# Patient Record
Sex: Male | Born: 1940 | Race: White | Hispanic: No | Marital: Married | Smoking: Former smoker
Health system: Southern US, Community
[De-identification: ages and names within clinical notes are randomized; demographics above are authoritative.]

## PROBLEM LIST (undated history)

## (undated) ENCOUNTER — Inpatient Hospital Stay: Admission: EM | Payer: Self-pay | Source: Home / Self Care

## (undated) DIAGNOSIS — M792 Neuralgia and neuritis, unspecified: Secondary | ICD-10-CM

## (undated) DIAGNOSIS — F1190 Opioid use, unspecified, uncomplicated: Secondary | ICD-10-CM

## (undated) DIAGNOSIS — M545 Low back pain: Secondary | ICD-10-CM

## (undated) DIAGNOSIS — F419 Anxiety disorder, unspecified: Secondary | ICD-10-CM

## (undated) DIAGNOSIS — G8929 Other chronic pain: Secondary | ICD-10-CM

## (undated) DIAGNOSIS — I509 Heart failure, unspecified: Secondary | ICD-10-CM

## (undated) DIAGNOSIS — F329 Major depressive disorder, single episode, unspecified: Secondary | ICD-10-CM

## (undated) DIAGNOSIS — I712 Thoracic aortic aneurysm, without rupture, unspecified: Secondary | ICD-10-CM

## (undated) DIAGNOSIS — M549 Dorsalgia, unspecified: Secondary | ICD-10-CM

## (undated) DIAGNOSIS — F32A Depression, unspecified: Secondary | ICD-10-CM

## (undated) DIAGNOSIS — N3090 Cystitis, unspecified without hematuria: Secondary | ICD-10-CM

## (undated) DIAGNOSIS — I2510 Atherosclerotic heart disease of native coronary artery without angina pectoris: Secondary | ICD-10-CM

## (undated) DIAGNOSIS — K8020 Calculus of gallbladder without cholecystitis without obstruction: Secondary | ICD-10-CM

## (undated) HISTORY — PX: BACK SURGERY: SHX140

## (undated) HISTORY — PX: OTHER SURGICAL HISTORY: SHX169

## (undated) HISTORY — DX: Thoracic aortic aneurysm, without rupture, unspecified: I71.20

## (undated) HISTORY — DX: Atherosclerotic heart disease of native coronary artery without angina pectoris: I25.10

## (undated) HISTORY — DX: Low back pain: M54.5

## (undated) HISTORY — DX: Other chronic pain: G89.29

## (undated) HISTORY — DX: Thoracic aortic aneurysm, without rupture: I71.2

---
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
## 1999-02-09 ENCOUNTER — Ambulatory Visit (HOSPITAL_BASED_OUTPATIENT_CLINIC_OR_DEPARTMENT_OTHER): Admission: RE | Admit: 1999-02-09 | Discharge: 1999-02-09 | Payer: Self-pay | Admitting: Orthopedic Surgery

## 1999-04-18 ENCOUNTER — Ambulatory Visit (HOSPITAL_BASED_OUTPATIENT_CLINIC_OR_DEPARTMENT_OTHER): Admission: RE | Admit: 1999-04-18 | Discharge: 1999-04-18 | Payer: Self-pay | Admitting: Orthopedic Surgery

## 1999-06-29 ENCOUNTER — Encounter: Payer: Self-pay | Admitting: Rheumatology

## 1999-06-29 ENCOUNTER — Ambulatory Visit (HOSPITAL_COMMUNITY): Admission: RE | Admit: 1999-06-29 | Discharge: 1999-06-29 | Payer: Self-pay | Admitting: Rheumatology

## 2001-10-05 ENCOUNTER — Encounter: Admission: RE | Admit: 2001-10-05 | Discharge: 2001-10-05 | Payer: Self-pay | Admitting: Rheumatology

## 2001-10-05 ENCOUNTER — Encounter: Payer: Self-pay | Admitting: Rheumatology

## 2002-02-09 ENCOUNTER — Inpatient Hospital Stay (HOSPITAL_COMMUNITY): Admission: RE | Admit: 2002-02-09 | Discharge: 2002-02-11 | Payer: Self-pay | Admitting: Neurosurgery

## 2002-02-09 ENCOUNTER — Encounter: Payer: Self-pay | Admitting: Neurosurgery

## 2002-03-08 ENCOUNTER — Inpatient Hospital Stay (HOSPITAL_COMMUNITY): Admission: AD | Admit: 2002-03-08 | Discharge: 2002-03-23 | Payer: Self-pay | Admitting: Neurosurgery

## 2002-03-08 ENCOUNTER — Encounter: Payer: Self-pay | Admitting: Neurosurgery

## 2002-03-10 ENCOUNTER — Encounter: Payer: Self-pay | Admitting: Neurosurgery

## 2002-03-11 ENCOUNTER — Encounter: Payer: Self-pay | Admitting: Neurosurgery

## 2002-03-16 ENCOUNTER — Encounter: Payer: Self-pay | Admitting: Infectious Diseases

## 2002-04-02 ENCOUNTER — Encounter: Payer: Self-pay | Admitting: Infectious Diseases

## 2002-04-02 ENCOUNTER — Ambulatory Visit (HOSPITAL_COMMUNITY): Admission: RE | Admit: 2002-04-02 | Discharge: 2002-04-02 | Payer: Self-pay | Admitting: Infectious Diseases

## 2002-04-02 ENCOUNTER — Encounter: Admission: RE | Admit: 2002-04-02 | Discharge: 2002-04-02 | Payer: Self-pay | Admitting: Infectious Diseases

## 2002-04-08 ENCOUNTER — Encounter: Payer: Self-pay | Admitting: Infectious Diseases

## 2002-04-08 ENCOUNTER — Ambulatory Visit (HOSPITAL_COMMUNITY): Admission: RE | Admit: 2002-04-08 | Discharge: 2002-04-08 | Payer: Self-pay | Admitting: Infectious Diseases

## 2002-04-13 ENCOUNTER — Encounter: Admission: RE | Admit: 2002-04-13 | Discharge: 2002-04-13 | Payer: Self-pay | Admitting: Infectious Diseases

## 2002-04-14 ENCOUNTER — Inpatient Hospital Stay (HOSPITAL_COMMUNITY): Admission: AD | Admit: 2002-04-14 | Discharge: 2002-04-22 | Payer: Self-pay | Admitting: Neurosurgery

## 2002-04-29 ENCOUNTER — Ambulatory Visit (HOSPITAL_COMMUNITY): Admission: RE | Admit: 2002-04-29 | Discharge: 2002-04-29 | Payer: Self-pay | Admitting: Neurosurgery

## 2002-04-29 ENCOUNTER — Encounter: Admission: RE | Admit: 2002-04-29 | Discharge: 2002-04-29 | Payer: Self-pay | Admitting: Neurosurgery

## 2002-04-29 ENCOUNTER — Encounter: Payer: Self-pay | Admitting: Neurosurgery

## 2002-05-03 ENCOUNTER — Encounter: Admission: RE | Admit: 2002-05-03 | Discharge: 2002-05-03 | Payer: Self-pay | Admitting: Infectious Diseases

## 2002-05-10 ENCOUNTER — Encounter: Admission: RE | Admit: 2002-05-10 | Discharge: 2002-05-10 | Payer: Self-pay | Admitting: Infectious Diseases

## 2002-05-14 ENCOUNTER — Encounter: Admission: RE | Admit: 2002-05-14 | Discharge: 2002-05-14 | Payer: Self-pay | Admitting: Neurosurgery

## 2002-05-14 ENCOUNTER — Encounter: Payer: Self-pay | Admitting: Neurosurgery

## 2002-05-26 ENCOUNTER — Encounter: Payer: Self-pay | Admitting: Neurosurgery

## 2002-05-26 ENCOUNTER — Encounter: Admission: RE | Admit: 2002-05-26 | Discharge: 2002-05-26 | Payer: Self-pay | Admitting: Neurosurgery

## 2002-06-09 ENCOUNTER — Encounter: Admission: RE | Admit: 2002-06-09 | Discharge: 2002-06-09 | Payer: Self-pay | Admitting: Infectious Diseases

## 2002-06-09 ENCOUNTER — Ambulatory Visit (HOSPITAL_COMMUNITY): Admission: RE | Admit: 2002-06-09 | Discharge: 2002-06-09 | Payer: Self-pay | Admitting: Neurosurgery

## 2002-06-09 ENCOUNTER — Encounter: Payer: Self-pay | Admitting: Neurosurgery

## 2002-06-21 ENCOUNTER — Encounter: Payer: Self-pay | Admitting: Neurosurgery

## 2002-06-21 ENCOUNTER — Ambulatory Visit (HOSPITAL_COMMUNITY): Admission: RE | Admit: 2002-06-21 | Discharge: 2002-06-21 | Payer: Self-pay | Admitting: Neurosurgery

## 2002-06-26 ENCOUNTER — Ambulatory Visit (HOSPITAL_COMMUNITY): Admission: RE | Admit: 2002-06-26 | Discharge: 2002-06-27 | Payer: Self-pay | Admitting: Neurosurgery

## 2002-06-26 ENCOUNTER — Encounter: Payer: Self-pay | Admitting: Neurosurgery

## 2002-07-26 ENCOUNTER — Encounter: Admission: RE | Admit: 2002-07-26 | Discharge: 2002-07-26 | Payer: Self-pay | Admitting: Infectious Diseases

## 2003-02-18 ENCOUNTER — Encounter: Admission: RE | Admit: 2003-02-18 | Discharge: 2003-02-18 | Payer: Self-pay | Admitting: Family Medicine

## 2003-02-18 ENCOUNTER — Encounter: Payer: Self-pay | Admitting: Family Medicine

## 2003-05-18 ENCOUNTER — Ambulatory Visit (HOSPITAL_COMMUNITY): Admission: RE | Admit: 2003-05-18 | Discharge: 2003-05-18 | Payer: Self-pay | Admitting: Gastroenterology

## 2011-10-23 ENCOUNTER — Other Ambulatory Visit (HOSPITAL_COMMUNITY): Payer: Self-pay | Admitting: Gastroenterology

## 2011-10-23 DIAGNOSIS — R933 Abnormal findings on diagnostic imaging of other parts of digestive tract: Secondary | ICD-10-CM

## 2011-10-24 ENCOUNTER — Ambulatory Visit (HOSPITAL_COMMUNITY)
Admission: RE | Admit: 2011-10-24 | Discharge: 2011-10-24 | Disposition: A | Discharge status: Home / Self Care | Payer: Medicare Other | Source: Ambulatory Visit | Attending: Gastroenterology | Admitting: Gastroenterology

## 2011-10-24 DIAGNOSIS — R933 Abnormal findings on diagnostic imaging of other parts of digestive tract: Secondary | ICD-10-CM

## 2011-10-24 DIAGNOSIS — Q438 Other specified congenital malformations of intestine: Secondary | ICD-10-CM | POA: Insufficient documentation

## 2011-10-24 DIAGNOSIS — M4120 Other idiopathic scoliosis, site unspecified: Secondary | ICD-10-CM | POA: Insufficient documentation

## 2011-10-24 IMAGING — CR DG BE W/ CM - WO/W KUB
8 of 10 series · 8 of 10 positions shown · non-contrast
Comparison: None.

CLINICAL DATA: Incomplete colonoscopy.

AIR CONTRAST BARIUM ENEMA
TECHNIQUE: Initial scout AP supine abdominal image obtained to
insure adequate colon cleansing.  Barium was introduced into the
colon in a retrograde fashion and refluxed from the rectum to the
distal transverse colon.  As much of the barium as possible was
then removed through the indwelling tube via gravity drain.  Air
was then insufflated into the colon.  Spot images of the colon
followed by overhead radiographs were obtained.
Fluoroscopy time:  3.5 minutes

[view not recorded (1 of 8)]
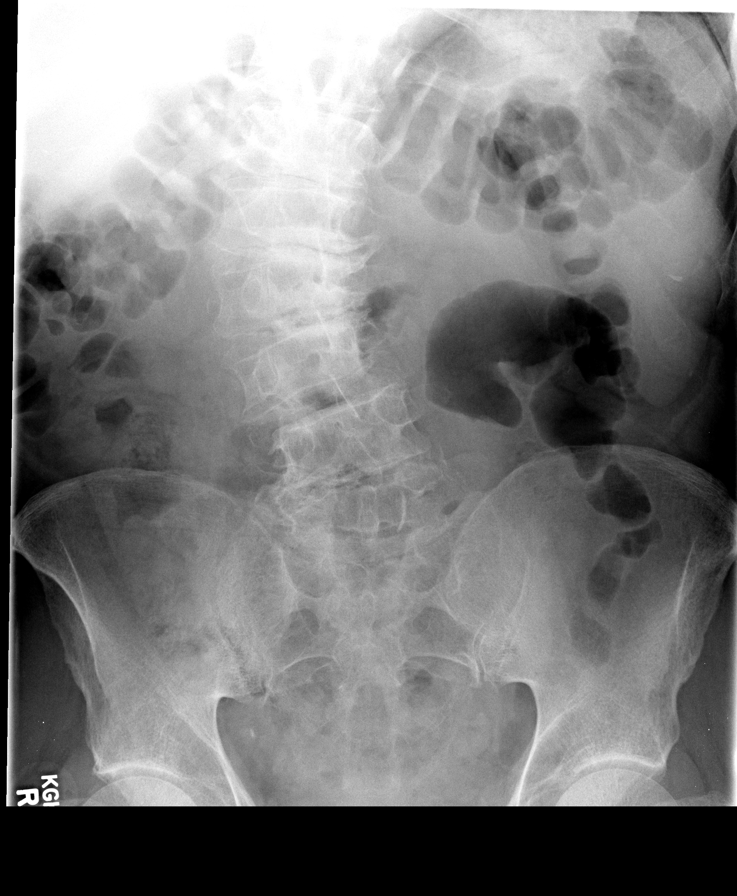

[view not recorded (2 of 8)]
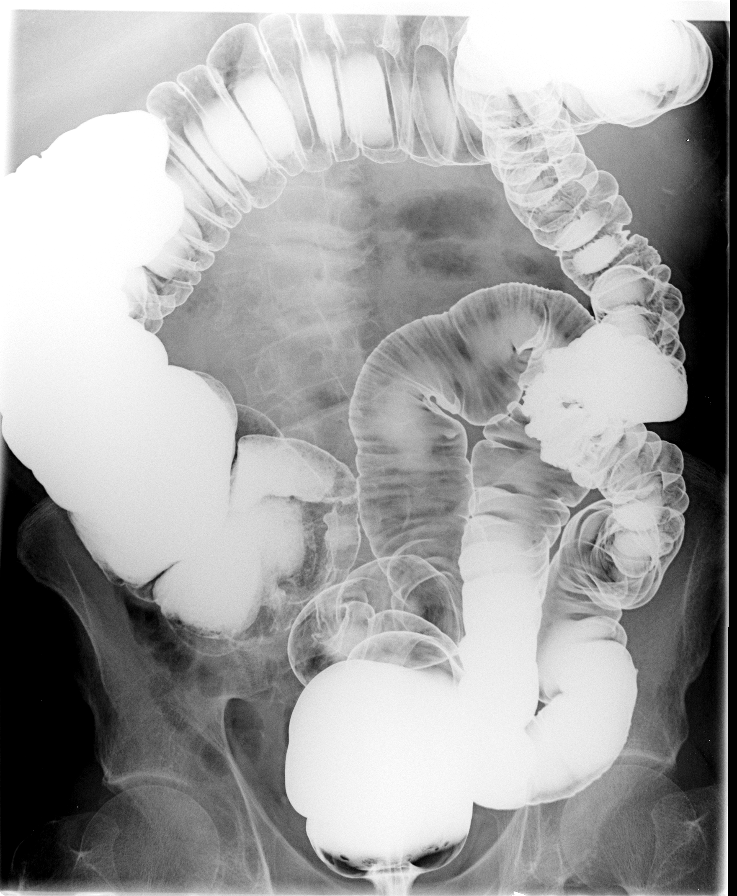

[view not recorded (3 of 8)]
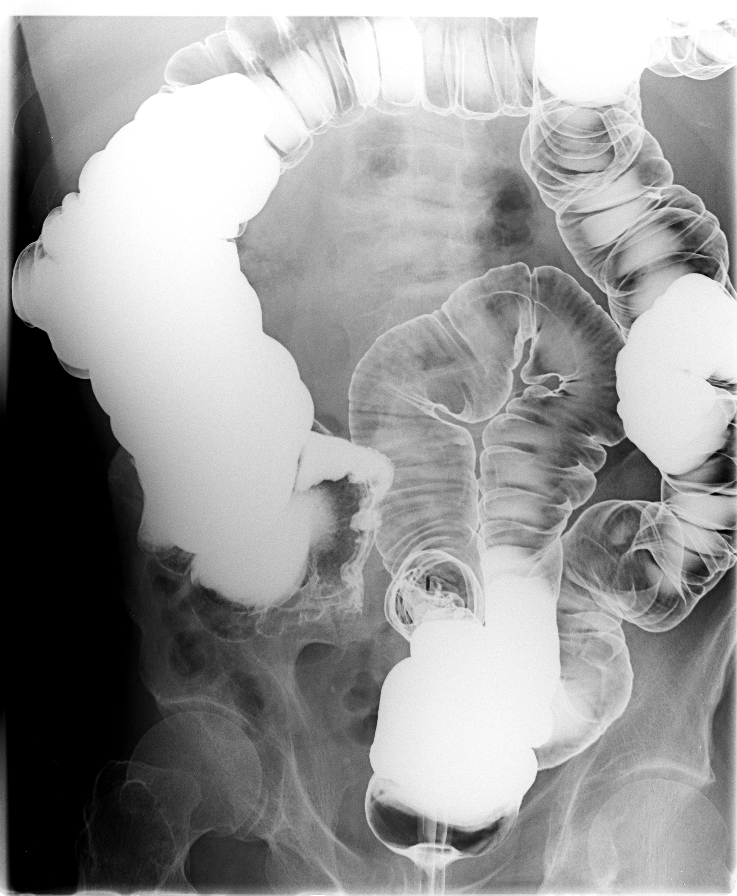

[view not recorded (4 of 8)]
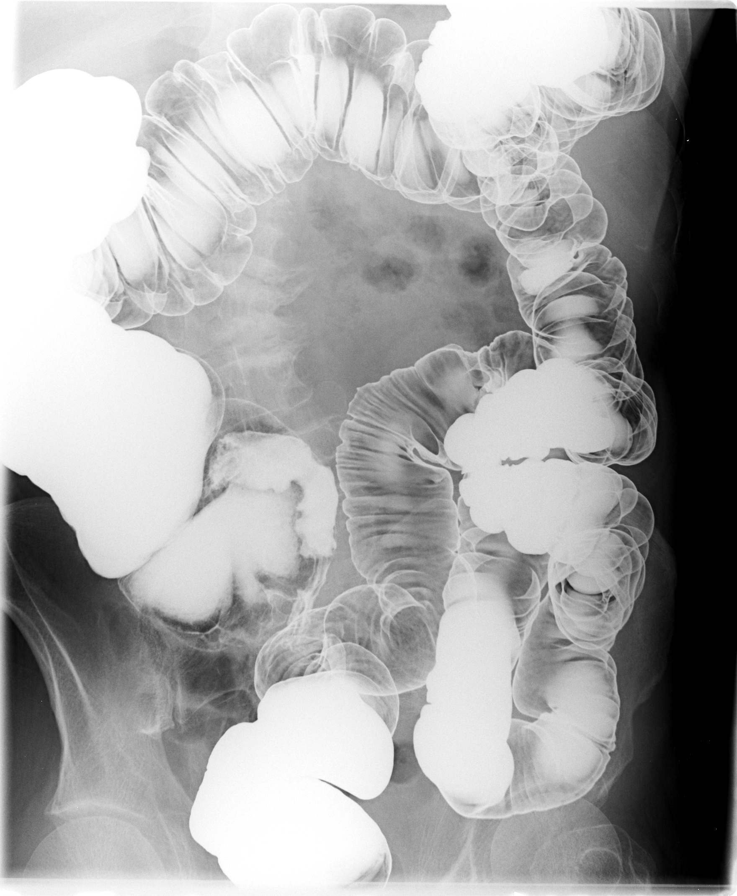

[view not recorded (5 of 8)]
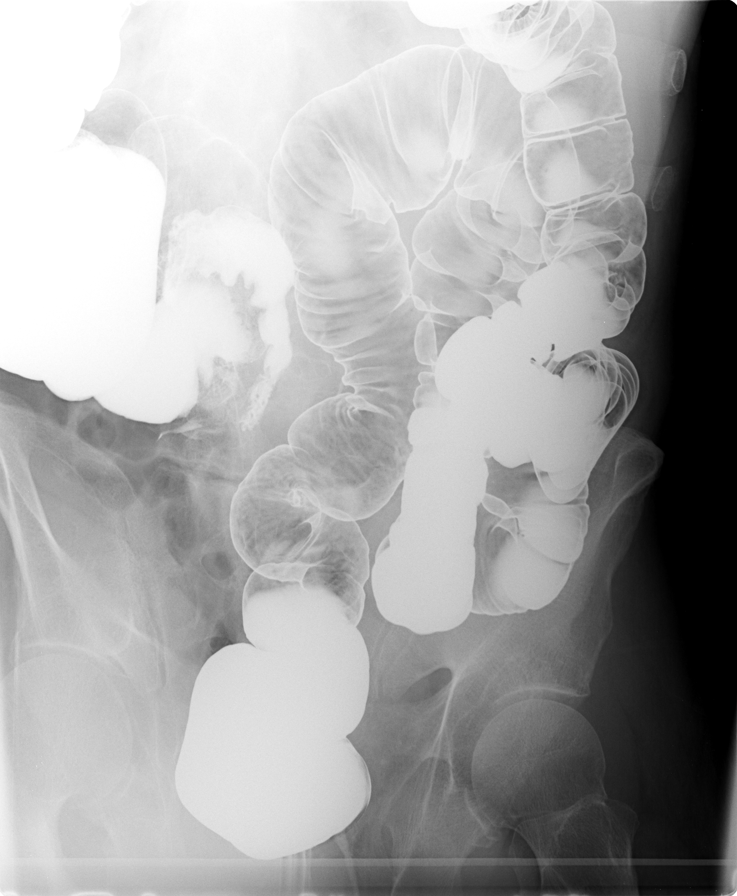

[view not recorded (6 of 8)]
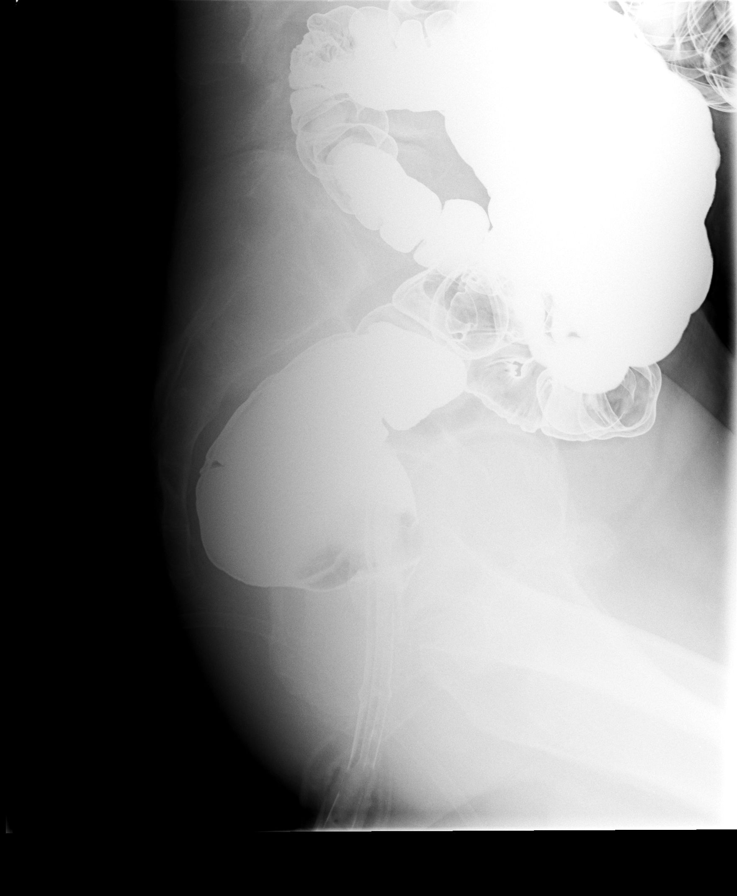

[view not recorded (7 of 8)]
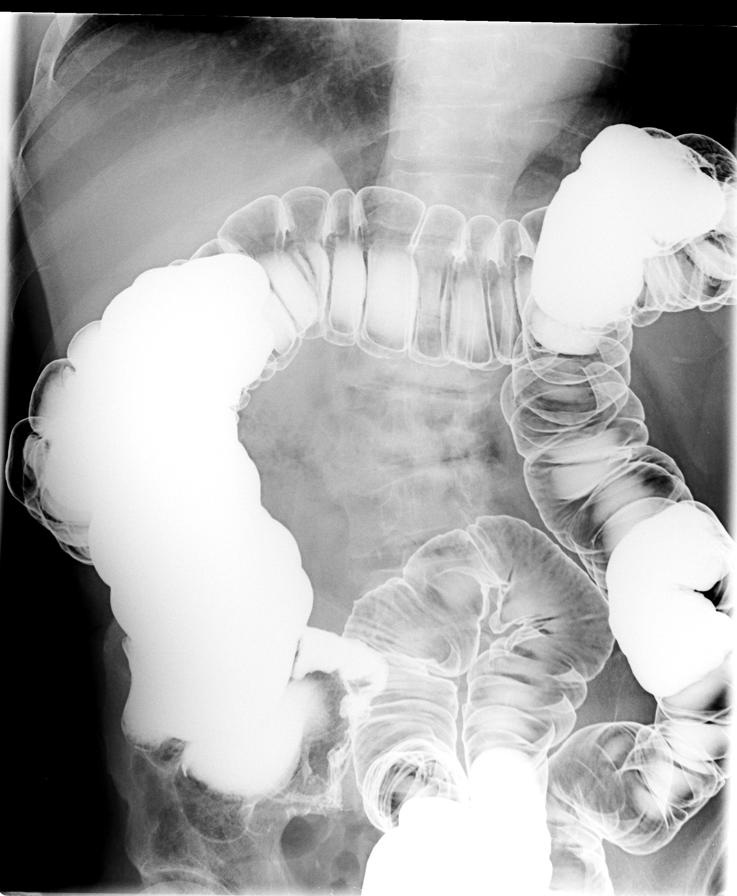

[view not recorded (8 of 8)]
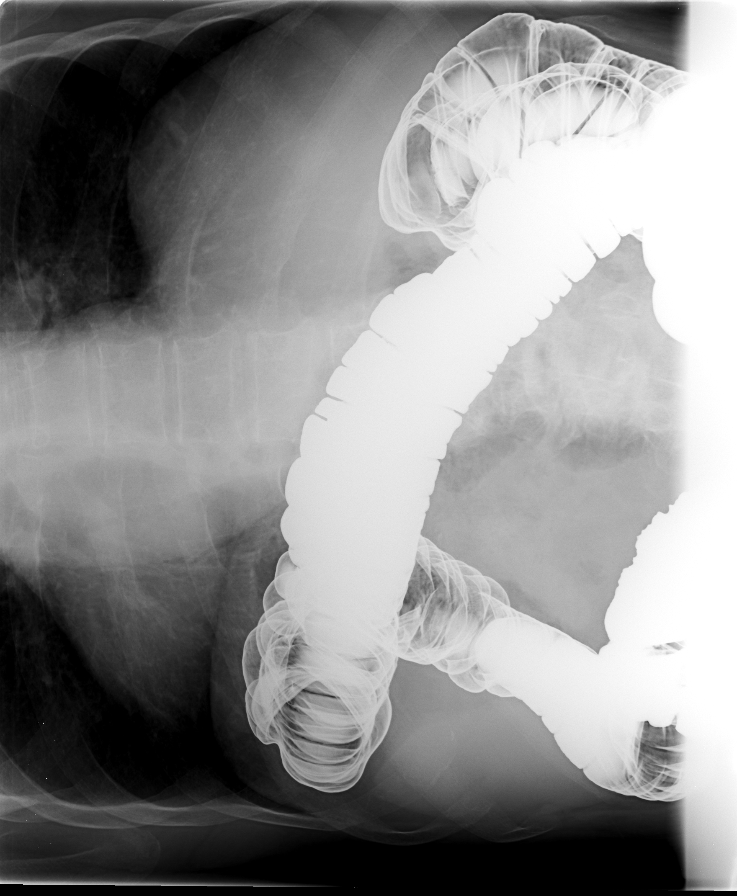

[8 of 10 positions shown; findings below may reference images not displayed]

FINDINGS: Scout image of the abdomen shows moderate retained air
in the colon, possibly from recent colonoscopy.  No visible
retained stool.  No evidence of bowel obstruction.  Severe
rightward scoliosis and degenerative changes in the lumbar spine.

There is tortuosity of the colon, particularly the sigmoid colon.
Mild tortuosity at the splenic flexure as well.  No fixed polypoid
filling defect.  No annular constricting lesion or visible mass.
IMPRESSION: Unremarkable study.

## 2011-10-24 IMAGING — RF DG BE W/ CM - WO/W KUB
11 series · 11 of 11 positions shown · non-contrast
Comparison: None.

CLINICAL DATA: Incomplete colonoscopy.

AIR CONTRAST BARIUM ENEMA
TECHNIQUE: Initial scout AP supine abdominal image obtained to
insure adequate colon cleansing.  Barium was introduced into the
colon in a retrograde fashion and refluxed from the rectum to the
distal transverse colon.  As much of the barium as possible was
then removed through the indwelling tube via gravity drain.  Air
was then insufflated into the colon.  Spot images of the colon
followed by overhead radiographs were obtained.
Fluoroscopy time:  3.5 minutes

[Series 1: run · 1 of 1 slices shown (1 of 11)]
[im 1/1]
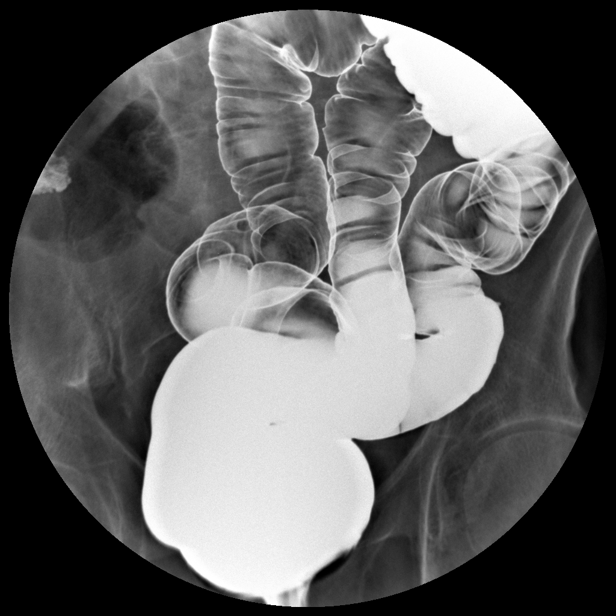

[Series 2: run · 1 of 1 slices shown (2 of 11)]
[im 1/1]
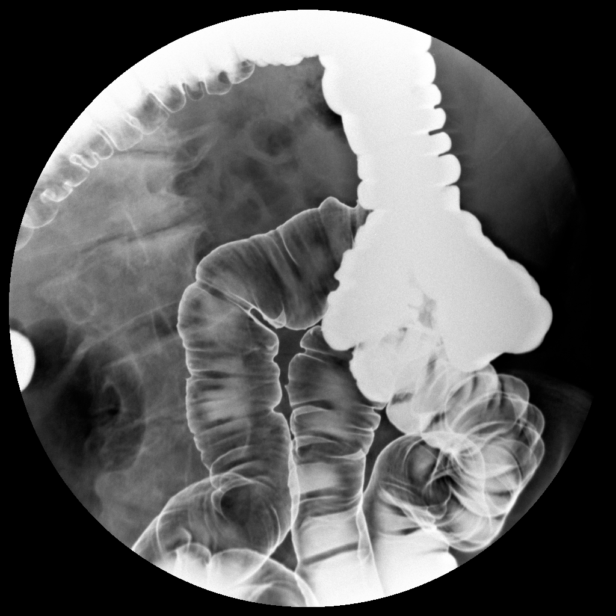

[Series 3: run · 1 of 1 slices shown (3 of 11)]
[im 1/1]
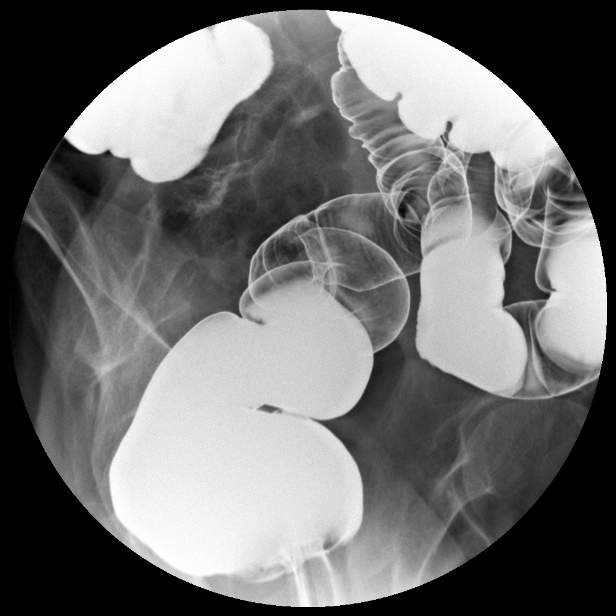

[Series 4: run · 1 of 1 slices shown (4 of 11)]
[im 1/1]
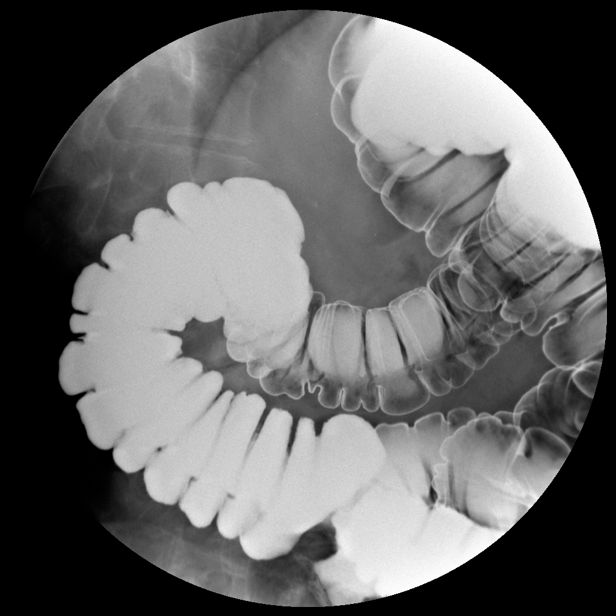

[Series 5: run · 1 of 1 slices shown (5 of 11)]
[im 1/1]
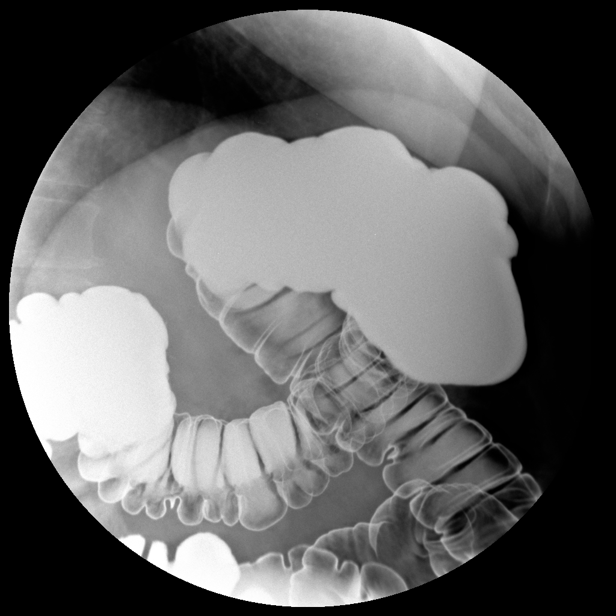

[Series 6: run · 1 of 1 slices shown (6 of 11)]
[im 1/1]
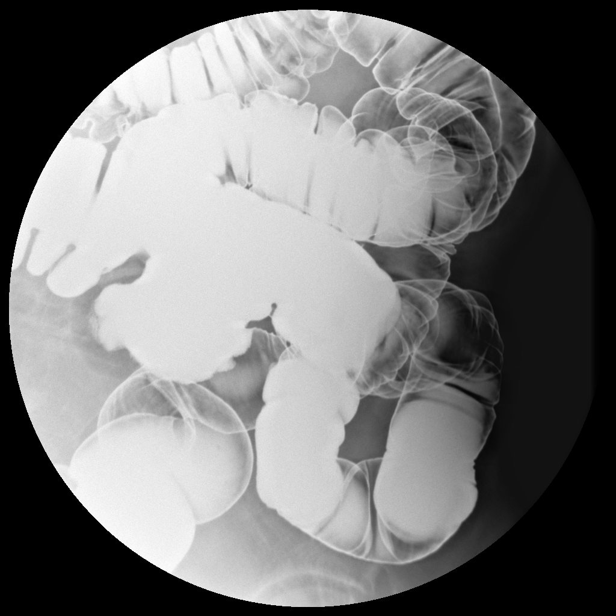

[Series 7: run · 1 of 1 slices shown (7 of 11)]
[im 1/1]
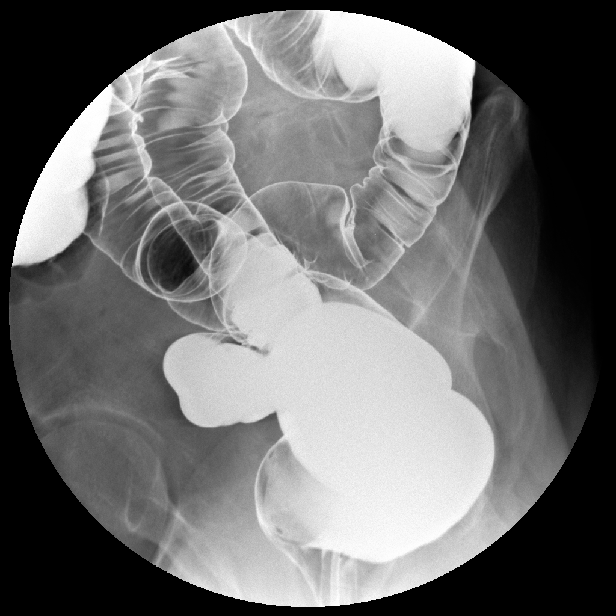

[Series 8: run · 1 of 1 slices shown (8 of 11)]
[im 1/1]
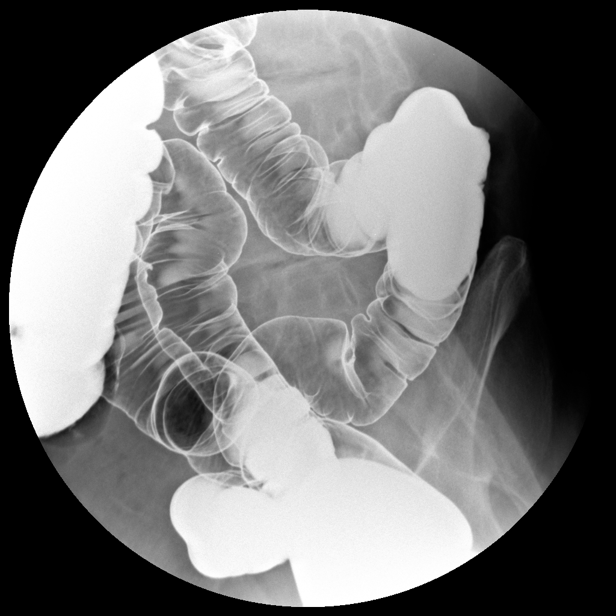

[Series 9: run · 1 of 1 slices shown (9 of 11)]
[im 1/1]
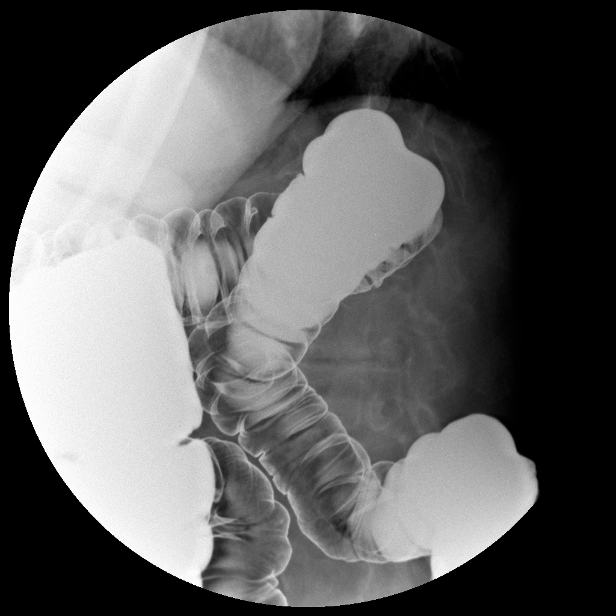

[Series 10: run · 1 of 1 slices shown (10 of 11)]
[im 1/1]
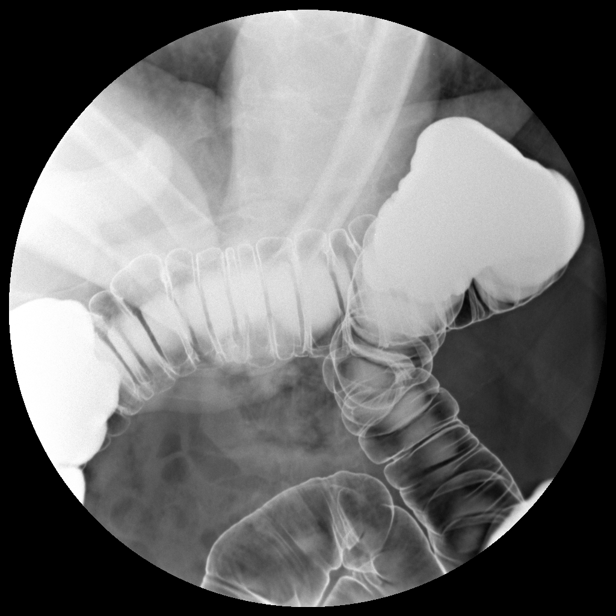

[Series 11: run · 1 of 1 slices shown (11 of 11)]
[im 1/1]
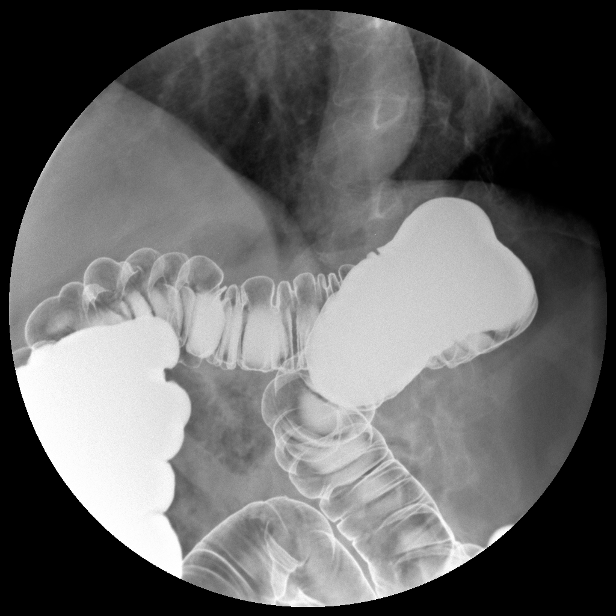

[11 of 11 positions shown; findings below may reference images not displayed]

FINDINGS: Scout image of the abdomen shows moderate retained air
in the colon, possibly from recent colonoscopy.  No visible
retained stool.  No evidence of bowel obstruction.  Severe
rightward scoliosis and degenerative changes in the lumbar spine.

There is tortuosity of the colon, particularly the sigmoid colon.
Mild tortuosity at the splenic flexure as well.  No fixed polypoid
filling defect.  No annular constricting lesion or visible mass.
IMPRESSION: Unremarkable study.

## 2014-02-08 ENCOUNTER — Emergency Department (HOSPITAL_BASED_OUTPATIENT_CLINIC_OR_DEPARTMENT_OTHER)
Admission: EM | Admit: 2014-02-08 | Discharge: 2014-02-08 | Disposition: A | Discharge status: Home / Self Care | Payer: Medicare Other | Attending: Emergency Medicine | Admitting: Emergency Medicine

## 2014-02-08 ENCOUNTER — Emergency Department (HOSPITAL_BASED_OUTPATIENT_CLINIC_OR_DEPARTMENT_OTHER): Payer: Medicare Other

## 2014-02-08 ENCOUNTER — Encounter (HOSPITAL_BASED_OUTPATIENT_CLINIC_OR_DEPARTMENT_OTHER): Payer: Self-pay | Admitting: Emergency Medicine

## 2014-02-08 DIAGNOSIS — R319 Hematuria, unspecified: Secondary | ICD-10-CM | POA: Insufficient documentation

## 2014-02-08 DIAGNOSIS — Z79899 Other long term (current) drug therapy: Secondary | ICD-10-CM | POA: Insufficient documentation

## 2014-02-08 DIAGNOSIS — Z791 Long term (current) use of non-steroidal anti-inflammatories (NSAID): Secondary | ICD-10-CM | POA: Insufficient documentation

## 2014-02-08 DIAGNOSIS — IMO0002 Reserved for concepts with insufficient information to code with codable children: Secondary | ICD-10-CM | POA: Insufficient documentation

## 2014-02-08 DIAGNOSIS — S298XXA Other specified injuries of thorax, initial encounter: Secondary | ICD-10-CM | POA: Insufficient documentation

## 2014-02-08 DIAGNOSIS — S3981XA Other specified injuries of abdomen, initial encounter: Secondary | ICD-10-CM | POA: Insufficient documentation

## 2014-02-08 DIAGNOSIS — S76012A Strain of muscle, fascia and tendon of left hip, initial encounter: Secondary | ICD-10-CM

## 2014-02-08 DIAGNOSIS — Y929 Unspecified place or not applicable: Secondary | ICD-10-CM | POA: Insufficient documentation

## 2014-02-08 DIAGNOSIS — X500XXA Overexertion from strenuous movement or load, initial encounter: Secondary | ICD-10-CM | POA: Insufficient documentation

## 2014-02-08 DIAGNOSIS — Y9389 Activity, other specified: Secondary | ICD-10-CM | POA: Insufficient documentation

## 2014-02-08 HISTORY — DX: Neuralgia and neuritis, unspecified: M79.2

## 2014-02-08 IMAGING — CR DG HIP W/ PELVIS BILAT
5 series · 5 of 5 positions shown · non-contrast
Comparison: None.

CLINICAL DATA: Hip pain

EXAM:
BILATERAL HIP WITH PELVIS - 4+ VIEW

[t pelvis a.p.]
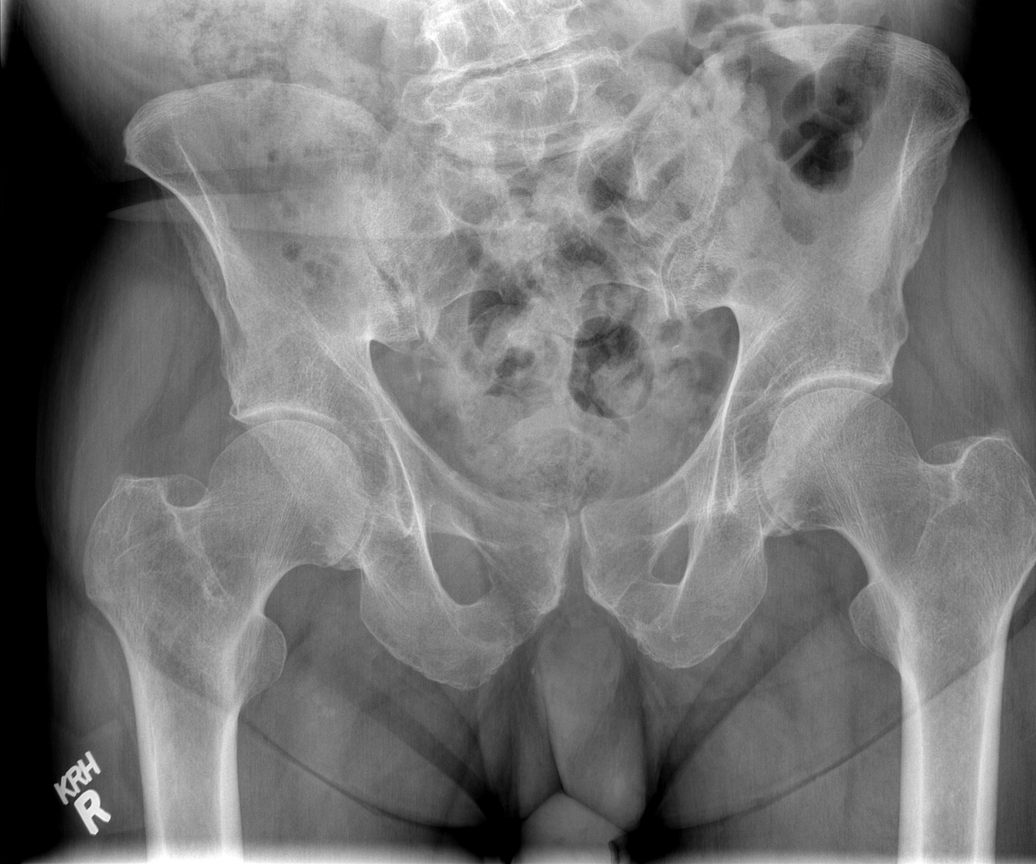

[t hip ap left]
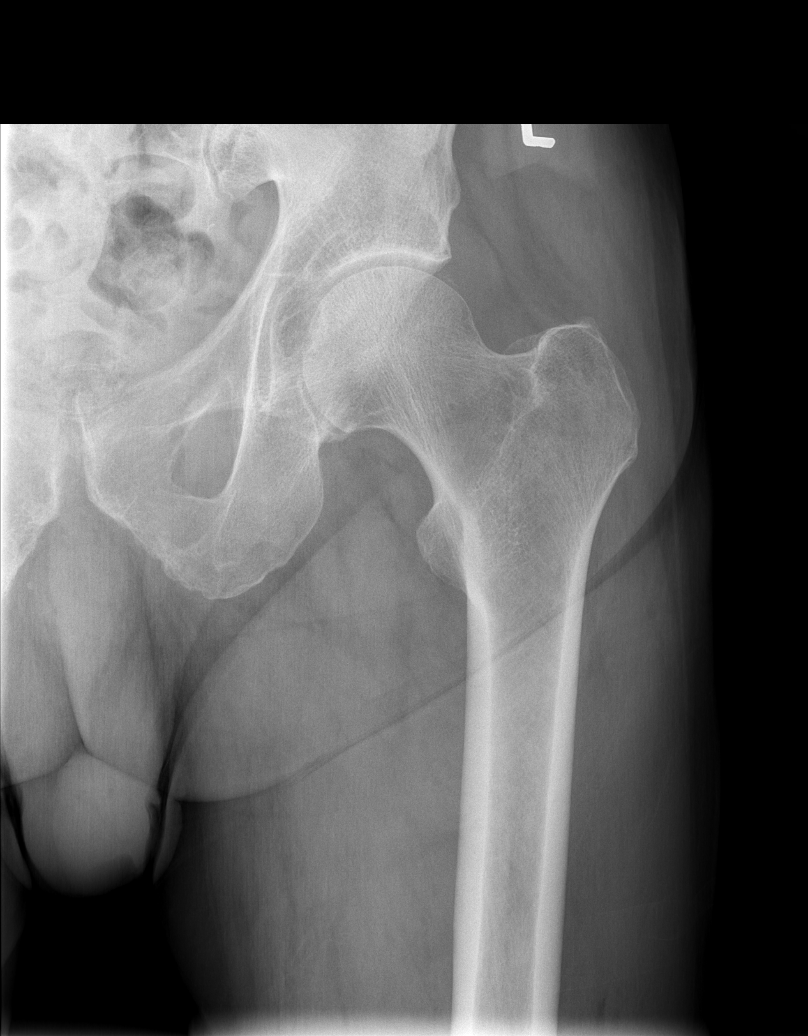

[t hip frog leg left]
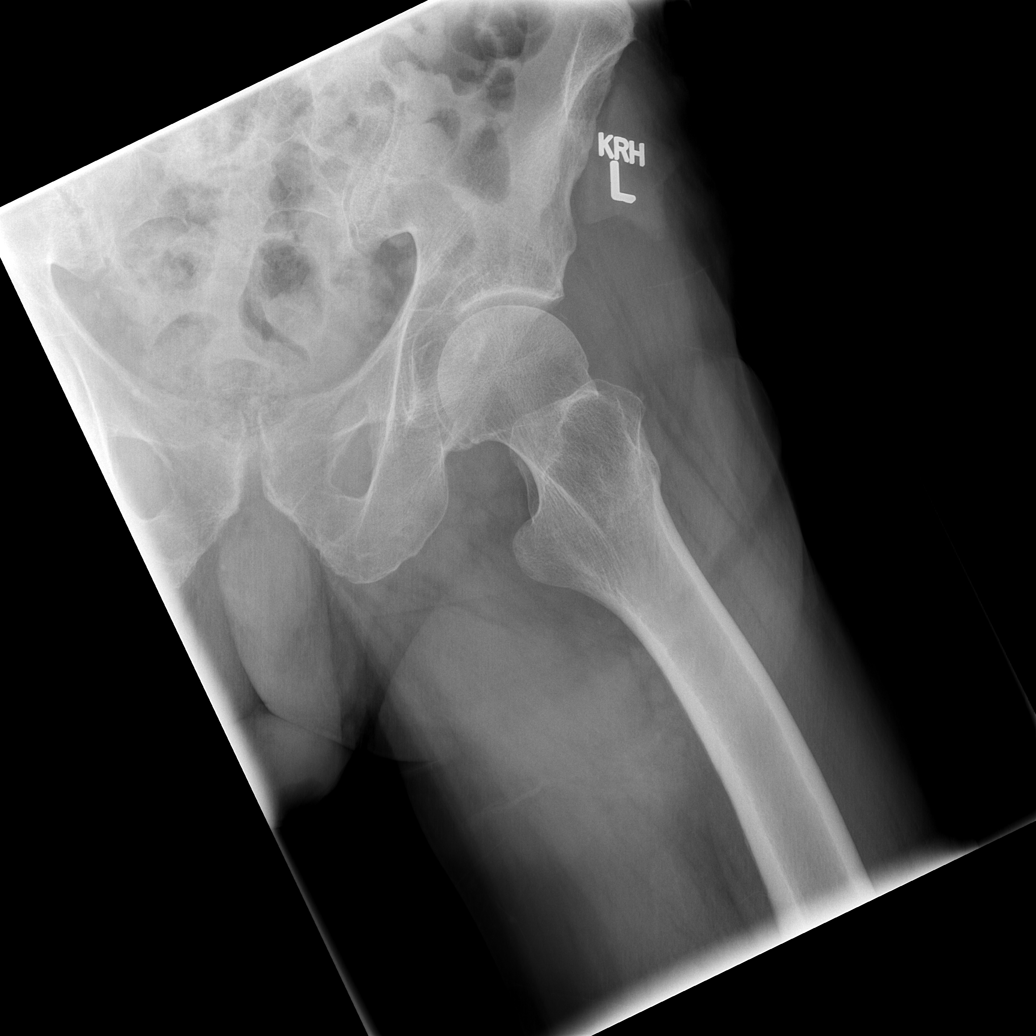

[t hip ap right]
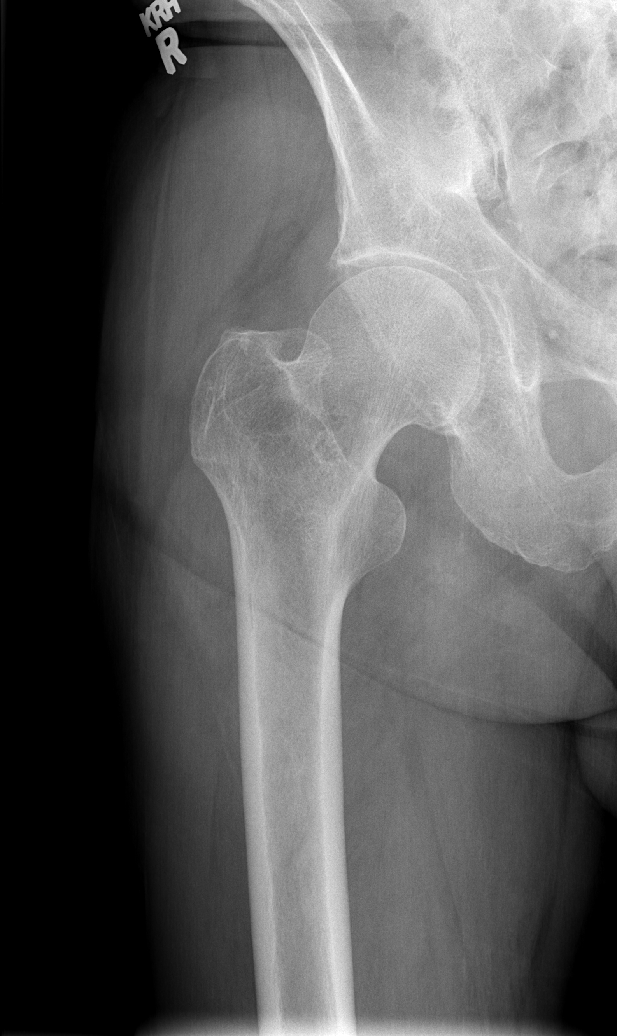

[t hip frog leg right]
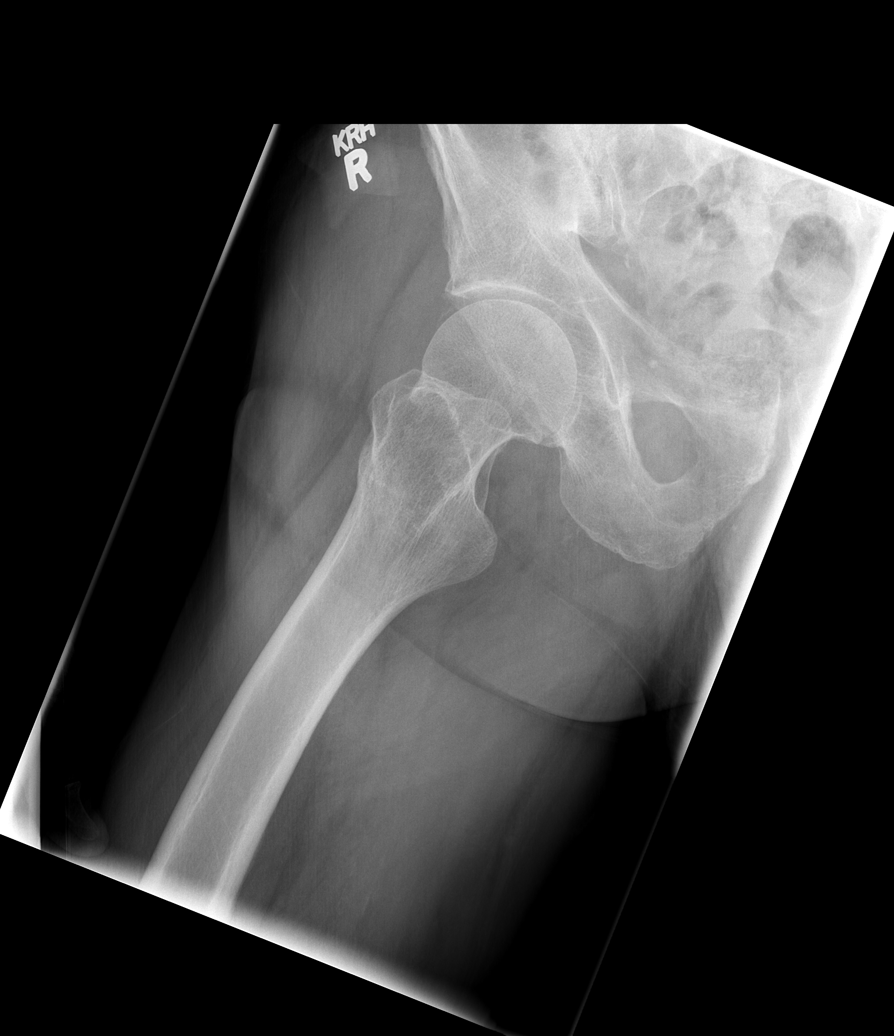

[5 of 5 positions shown; findings below may reference images not displayed]

FINDINGS: Normal alignment and no fracture. Joint space is normal. Minimal
chondrocalcinosis.
IMPRESSION: Negative

## 2014-02-08 IMAGING — CR DG RIBS W/ CHEST 3+V*L*
4 series · 4 of 4 positions shown · non-contrast
Comparison: None available

CLINICAL DATA: Hip pain radiating into lower anterior left rib
cage.

EXAM:
LEFT RIBS AND CHEST - 3+ VIEW

[w chest pa]
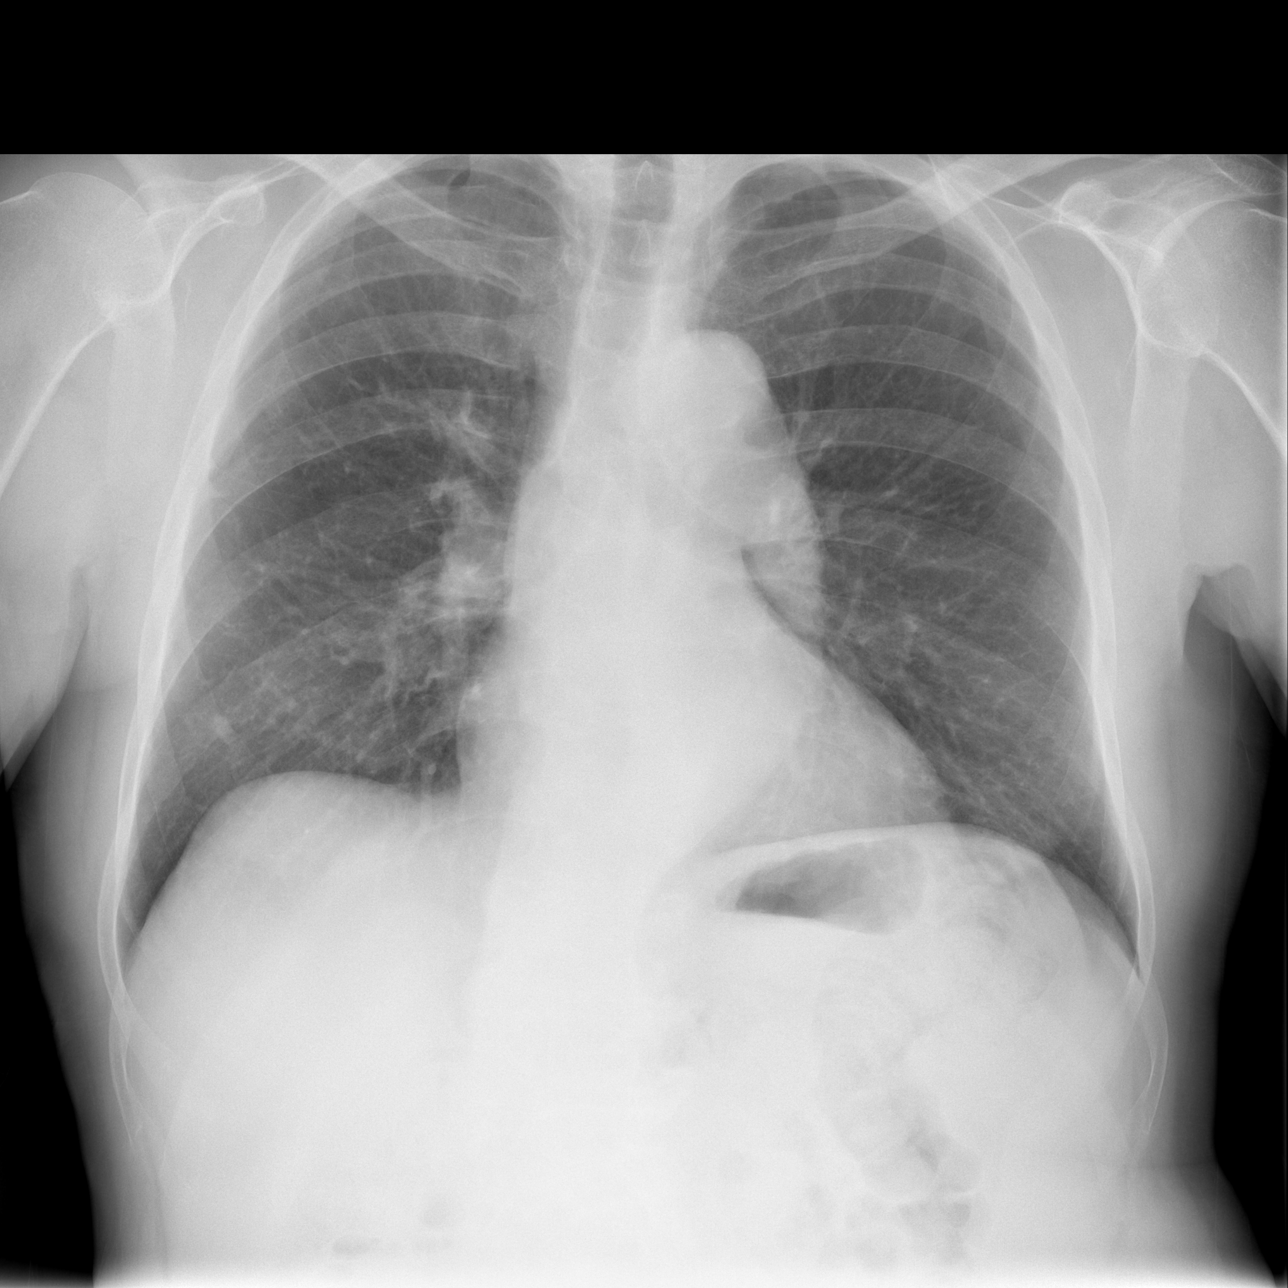

[w ribs ap/pa upper left]
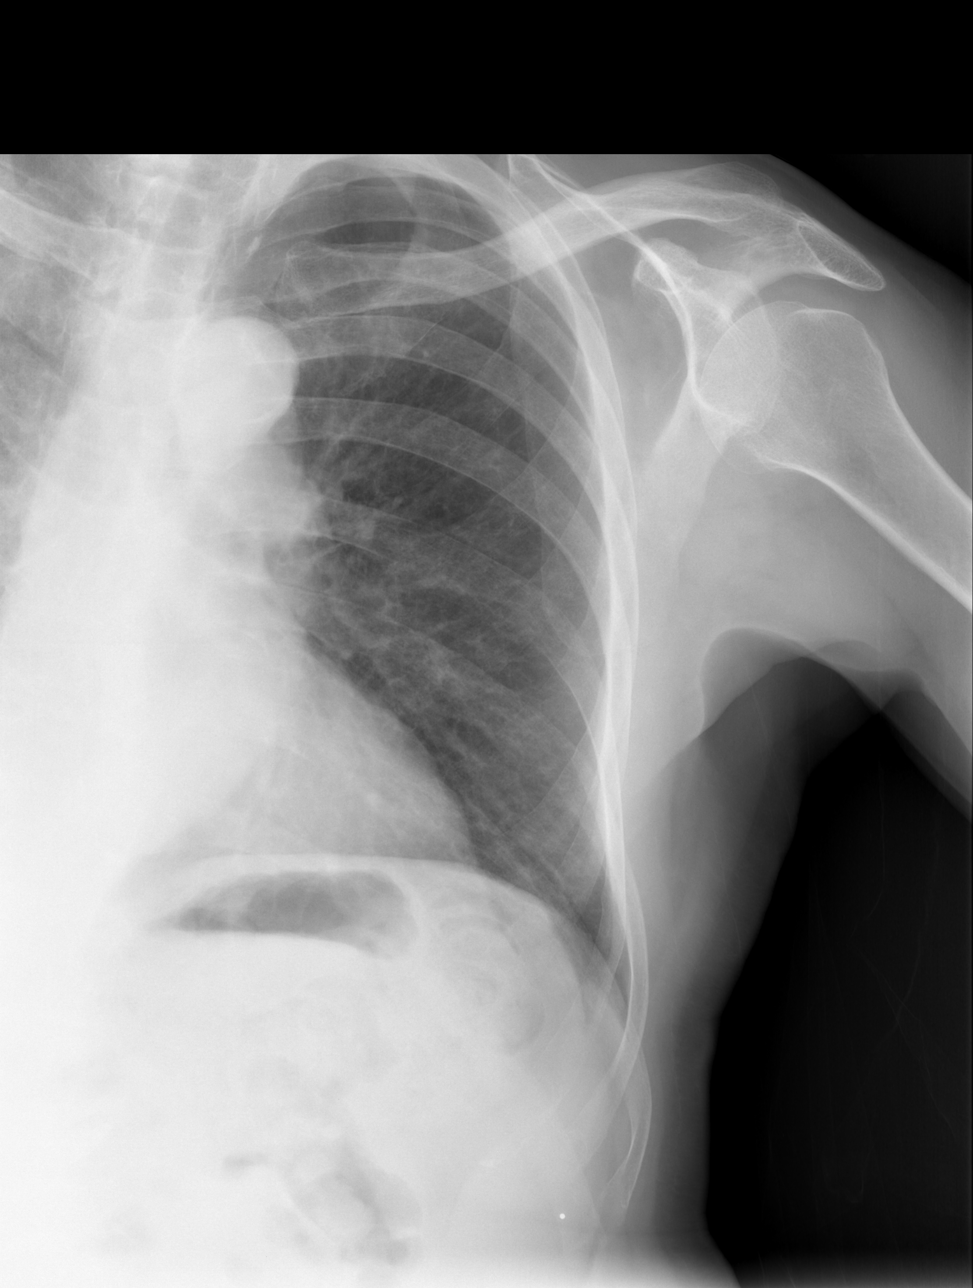

[w ribs ap/pa lower left]
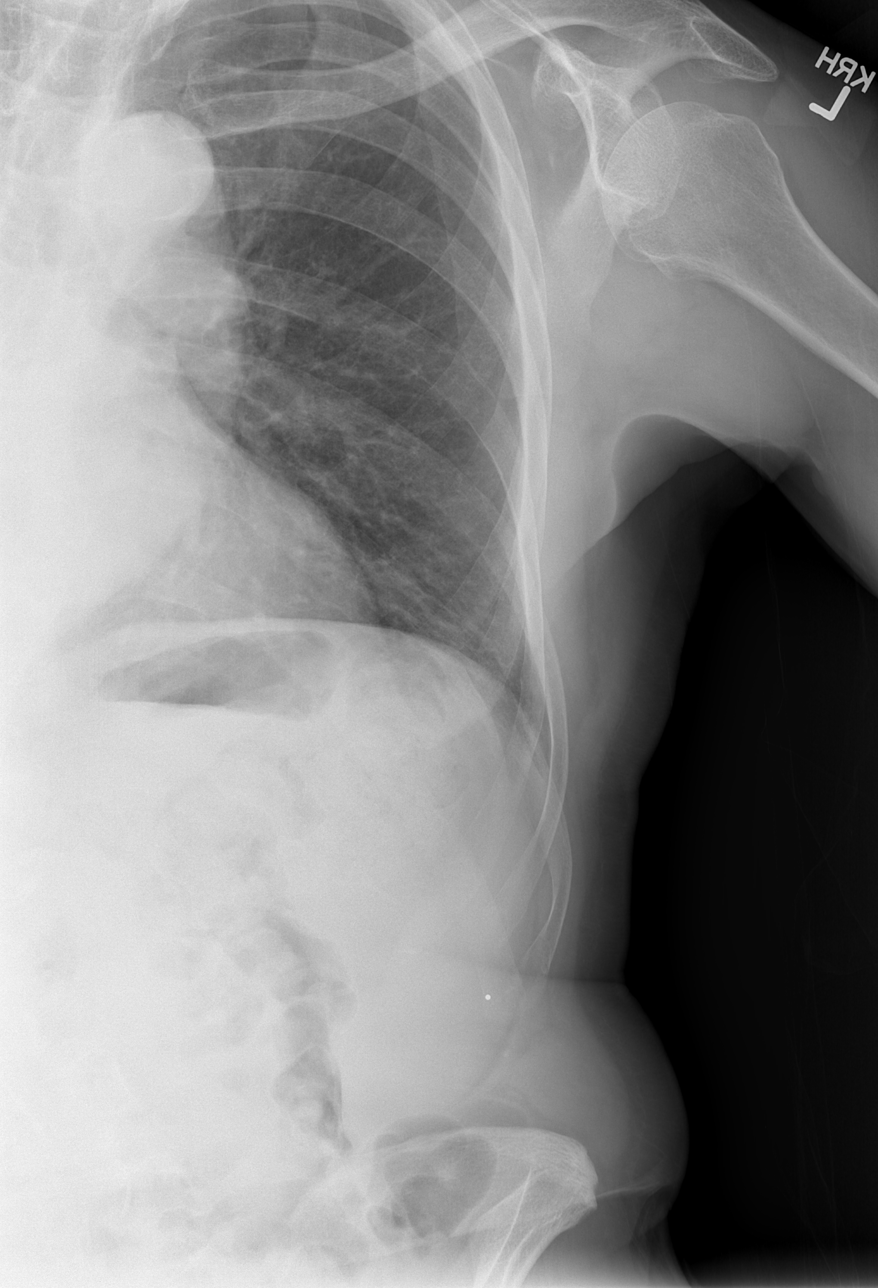

[w ribs oblique left]
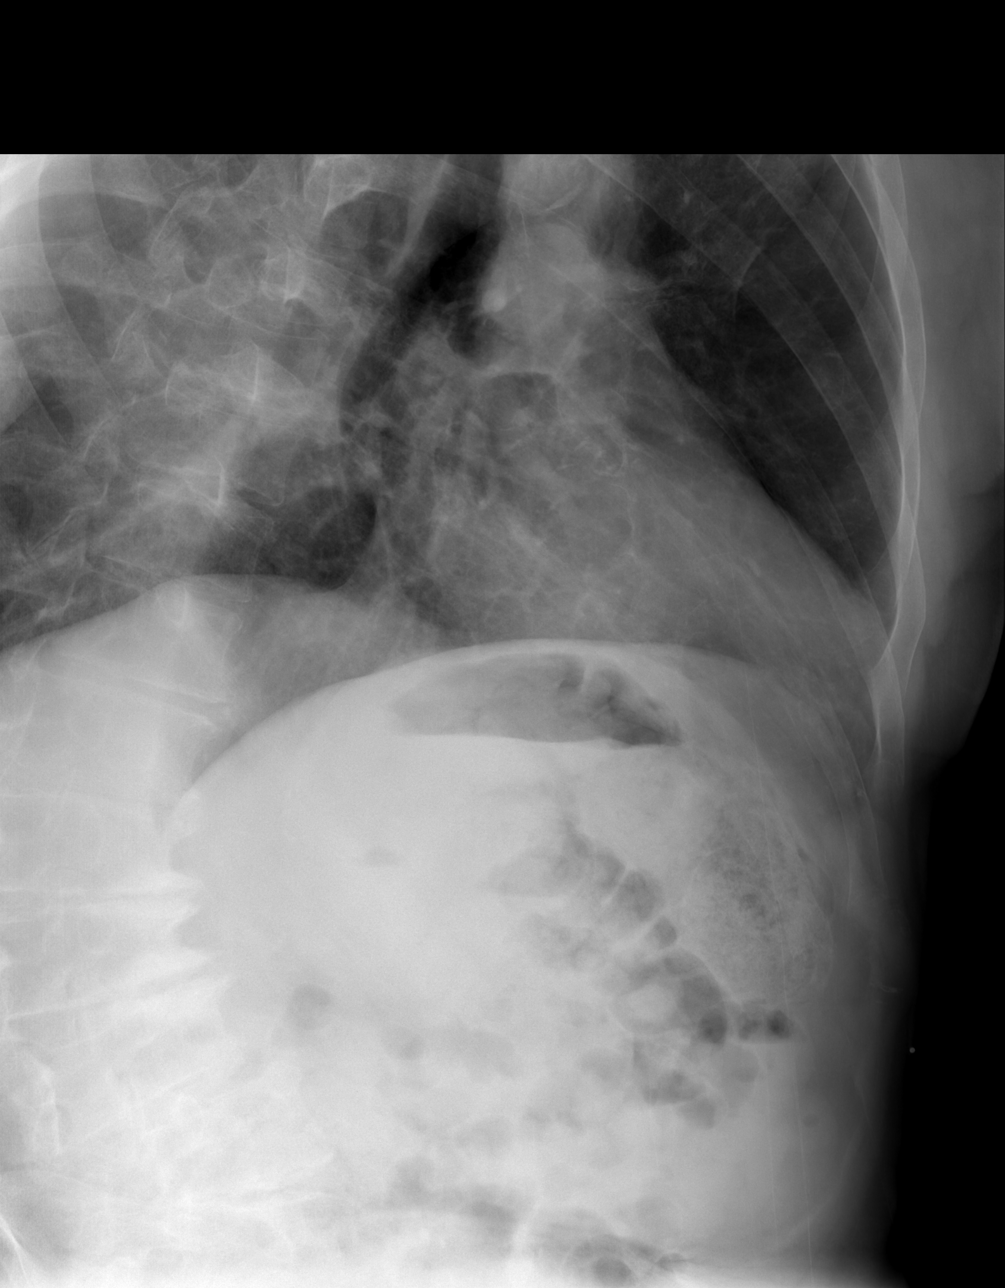

[4 of 4 positions shown; findings below may reference images not displayed]

FINDINGS: Cardiac silhouette within normal limits. Tortuosity of the
intrathoracic aorta noted. Mediastinal silhouette otherwise
unremarkable.

Lungs are normally inflated. No focal infiltrate, pulmonary edema,
or pleural effusion. There is no pneumothorax.

Metallic BB marker overlies the area of teen knee in the in the
lower left chest. No acute rib fracture or other osseous abnormality
identified.
IMPRESSION: 1. No acute rib fracture or other osseous abnormality.
2. No other acute cardiopulmonary abnormality.

## 2014-02-08 MED ORDER — HYDROMORPHONE HCL PF 2 MG/ML IJ SOLN
2.0000 mg | Freq: Once | INTRAMUSCULAR | Status: AC
  Administered 2014-02-08: 2 mg via INTRAMUSCULAR
  Filled 2014-02-08: qty 1

## 2014-02-08 MED ORDER — HYDROCODONE-ACETAMINOPHEN 5-325 MG PO TABS: 1.0000 | ORAL_TABLET | Freq: Four times a day (QID) | ORAL | Status: DC | PRN

## 2014-02-08 MED ORDER — CYCLOBENZAPRINE HCL 10 MG PO TABS: 10.0000 mg | ORAL_TABLET | Freq: Two times a day (BID) | ORAL | Status: DC | PRN

## 2014-02-08 NOTE — Discharge Instructions (Signed)
X-rays of the left hip were negative. X-rays of the ribs on the left and chest x-ray were negative. Suspect that there is a muscle pull in that area. Take the hydrocodone as needed. Take Flexeril as needed. Rest as much as possible. Not improving in one to 2 days followup with your record Dr. Return here for any newer worse symptoms.

## 2014-02-08 NOTE — ED Provider Notes (Signed)
CSN: [id number]     Arrival date & time 02/08/14  1315 History   First MD Initiated Contact with Patient 02/08/14 1331     Chief Complaint  Patient presents with  . Hip Pain     (Consider location/radiation/quality/duration/timing/severity/associated sxs/prior Treatment) The history is provided by the patient.   73 year old male was reaching in the car to lift something else and got sudden pain in his left flank upper hip and lower chest area. Made much worse by any kind of movements. Very musculoskeletal in nature. No nausea no vomiting no shortness of breath no anterior chest pain. No numbness in his left leg no radiation of pain down into the left leg. No popping sensation no crack heard. Patient still very uncomfortable pain is 10 out of 10. Never had anything like this happen before. Pain does not radiate to the lumbar area stays on the left flank predominantly at the left iliac crest area.  Past Medical History  Diagnosis Date  . Nerve pain    Past Surgical History  Procedure Laterality Date  . Back surgey     No family history on file. History  Substance Use Topics  . Smoking status: Never Smoker   . Smokeless tobacco: Not on file  . Alcohol Use: No    Review of Systems  Constitutional: Negative for fever.  HENT: Negative for congestion.   Eyes: Negative for visual disturbance.  Respiratory: Negative for shortness of breath.   Cardiovascular: Positive for chest pain.  Gastrointestinal: Negative for nausea, vomiting and abdominal pain.  Genitourinary: Positive for hematuria and flank pain. Negative for dysuria, scrotal swelling and testicular pain.  Musculoskeletal: Negative for back pain.  Skin: Negative for rash.  Neurological: Negative for headaches.  Hematological: Does not bruise/bleed easily.  Psychiatric/Behavioral: Negative for confusion.      Allergies  Review of patient's allergies indicates no known allergies.  Home Medications   Prior to Admission  medications   Medication Sig Start Date End Date Taking? Authorizing Provider  gabapentin (NEURONTIN) 800 MG tablet Take 800 mg by mouth 3 (three) times daily.   Yes Historical Provider, MD  HYDROcodone-acetaminophen (NORCO/VICODIN) 5-325 MG per tablet Take 1 tablet by mouth every 6 (six) hours as needed for moderate pain.   Yes Historical Provider, MD  Lido-Capsaicin-Men-Methyl Sal (MEDI-PATCH-LIDOCAINE) 0.5-0.035-5-20 % PTCH Apply topically.   Yes Historical Provider, MD  naproxen (NAPROSYN) 500 MG tablet Take 500 mg by mouth 2 (two) times daily with a meal.   Yes Historical Provider, MD  OxyCODONE HCl (OXYCONTIN PO) Take by mouth.   Yes Historical Provider, MD  PROMETHAZINE HCL PO Take by mouth.   Yes Historical Provider, MD  traZODone (DESYREL) 50 MG tablet Take 50 mg by mouth at bedtime.   Yes Historical Provider, MD  cyclobenzaprine (FLEXERIL) 10 MG tablet Take 1 tablet (10 mg total) by mouth 2 (two) times daily as needed for muscle spasms. 02/08/14   Fredia Sorrow, MD  HYDROcodone-acetaminophen (NORCO/VICODIN) 5-325 MG per tablet Take 1-2 tablets by mouth every 6 (six) hours as needed for moderate pain. 02/08/14   Fredia Sorrow, MD   BP 111/59  Pulse 72  Temp(Src) 97.7 F (36.5 C) (Oral)  Resp 16  Ht 5\' 8"  (1.727 m)  Wt 167 lb (75.751 kg)  BMI 25.40 kg/m2  SpO2 94% Physical Exam  Nursing note and vitals reviewed. Constitutional: He is oriented to person, place, and time. He appears well-developed and well-nourished. He appears distressed.  HENT:  Head: Normocephalic  and atraumatic.  Mouth/Throat: Oropharynx is clear and moist.  Eyes: EOM are normal. Pupils are equal, round, and reactive to light.  Neck: Normal range of motion.  Cardiovascular: Normal rate, regular rhythm and normal heart sounds.   Pulmonary/Chest: Effort normal and breath sounds normal. No respiratory distress.  Abdominal: Soft. Bowel sounds are normal. There is no tenderness.  Musculoskeletal: Normal range  of motion. He exhibits no edema and no tenderness.  Neurological: He is alert and oriented to person, place, and time. No cranial nerve deficit. He exhibits normal muscle tone. Coordination normal.  Skin: Skin is warm. No rash noted.    ED Course  Procedures (including critical care time) Labs Review Labs Reviewed - No data to display  Imaging Review Dg Ribs Unilateral W/chest Left  02/08/2014   CLINICAL DATA:  Hip pain radiating into lower anterior left rib cage.  EXAM: LEFT RIBS AND CHEST - 3+ VIEW  COMPARISON:  None available  FINDINGS: Cardiac silhouette within normal limits. Tortuosity of the intrathoracic aorta noted. Mediastinal silhouette otherwise unremarkable.  Lungs are normally inflated. No focal infiltrate, pulmonary edema, or pleural effusion. There is no pneumothorax.  Metallic BB marker overlies the area of teen knee in the in the lower left chest. No acute rib fracture or other osseous abnormality identified.  IMPRESSION: 1. No acute rib fracture or other osseous abnormality. 2. No other acute cardiopulmonary abnormality.   Electronically Signed   By: Jeannine Boga M.D.   On: 02/08/2014 14:47   Dg Hip Bilateral W/pelvis  02/08/2014   CLINICAL DATA:  Hip pain  EXAM: BILATERAL HIP WITH PELVIS - 4+ VIEW  COMPARISON:  None.  FINDINGS: Normal alignment and no fracture. Joint space is normal. Minimal chondrocalcinosis.  IMPRESSION: Negative   Electronically Signed   By: Franchot Gallo M.D.   On: 02/08/2014 14:45     EKG Interpretation None      MDM   Final diagnoses:  Hip strain, left, initial encounter    While reaching into the car patient had sudden acute pain round the left hip and flank area. Made worse by any kind of movements. Happen while trying to pick something up. Pain is 10 out of 10. Patient given 1 mg of hydromorphone IM here and is improving. Pain is mostly located at his left iliac crest area. No masses no groin hernia. There is no tenderness to the  back.  X-rays of the chest and ribs on that side his pain in radiating that area were negative. X-rays of both hips were negative. We'll treat as a strain with pain medication breast and muscle relaxer. Patient will followup with his doctor if not improving in 2 days.    Fredia Sorrow, MD 02/08/14 1504

## 2014-02-08 NOTE — ED Notes (Signed)
Patient transported to X-ray 

## 2014-02-08 NOTE — ED Notes (Signed)
Right lower rib pain radiating to hip that occurred after reaching in his truck to retrieve an item.

## 2015-08-23 ENCOUNTER — Emergency Department (HOSPITAL_COMMUNITY): Payer: Medicare Other

## 2015-08-23 ENCOUNTER — Emergency Department (HOSPITAL_COMMUNITY)
Admission: EM | Admit: 2015-08-23 | Discharge: 2015-08-23 | Disposition: A | Discharge status: Home / Self Care | Payer: Medicare Other | Attending: Emergency Medicine | Admitting: Emergency Medicine

## 2015-08-23 ENCOUNTER — Encounter (HOSPITAL_COMMUNITY): Payer: Self-pay | Admitting: Vascular Surgery

## 2015-08-23 DIAGNOSIS — Z79899 Other long term (current) drug therapy: Secondary | ICD-10-CM | POA: Diagnosis not present

## 2015-08-23 DIAGNOSIS — R61 Generalized hyperhidrosis: Secondary | ICD-10-CM | POA: Diagnosis not present

## 2015-08-23 DIAGNOSIS — Z8739 Personal history of other diseases of the musculoskeletal system and connective tissue: Secondary | ICD-10-CM | POA: Insufficient documentation

## 2015-08-23 DIAGNOSIS — Z79891 Long term (current) use of opiate analgesic: Secondary | ICD-10-CM | POA: Diagnosis not present

## 2015-08-23 DIAGNOSIS — R0602 Shortness of breath: Secondary | ICD-10-CM

## 2015-08-23 DIAGNOSIS — G8929 Other chronic pain: Secondary | ICD-10-CM | POA: Diagnosis not present

## 2015-08-23 DIAGNOSIS — R531 Weakness: Secondary | ICD-10-CM | POA: Insufficient documentation

## 2015-08-23 DIAGNOSIS — R42 Dizziness and giddiness: Secondary | ICD-10-CM | POA: Diagnosis not present

## 2015-08-23 DIAGNOSIS — R5383 Other fatigue: Secondary | ICD-10-CM | POA: Insufficient documentation

## 2015-08-23 DIAGNOSIS — Z791 Long term (current) use of non-steroidal anti-inflammatories (NSAID): Secondary | ICD-10-CM | POA: Insufficient documentation

## 2015-08-23 DIAGNOSIS — R51 Headache: Secondary | ICD-10-CM | POA: Diagnosis not present

## 2015-08-23 DIAGNOSIS — I712 Thoracic aortic aneurysm, without rupture, unspecified: Secondary | ICD-10-CM

## 2015-08-23 LAB — CBC
HCT: 39.1 % (ref 39.0–52.0)
Hemoglobin: 13.7 g/dL (ref 13.0–17.0)
MCH: 31.6 pg (ref 26.0–34.0)
MCHC: 35 g/dL (ref 30.0–36.0)
MCV: 90.1 fL (ref 78.0–100.0)
Platelets: 125 10*3/uL — ABNORMAL LOW (ref 150–400)
RBC: 4.34 MIL/uL (ref 4.22–5.81)
RDW: 12.5 % (ref 11.5–15.5)
WBC: 7.7 10*3/uL (ref 4.0–10.5)

## 2015-08-23 LAB — BASIC METABOLIC PANEL
Anion gap: 10 (ref 5–15)
BUN: 20 mg/dL (ref 6–20)
CO2: 25 mmol/L (ref 22–32)
Calcium: 9.3 mg/dL (ref 8.9–10.3)
Chloride: 103 mmol/L (ref 101–111)
Creatinine, Ser: 0.79 mg/dL (ref 0.61–1.24)
GFR calc Af Amer: >60 mL/min (ref >60)
GFR calc non Af Amer: >60 mL/min (ref >60)
Glucose, Bld: 133 mg/dL — ABNORMAL HIGH (ref 65–99)
Potassium: 4.1 mmol/L (ref 3.5–5.1)
Sodium: 138 mmol/L (ref 135–145)

## 2015-08-23 LAB — BRAIN NATRIURETIC PEPTIDE: B Natriuretic Peptide: 24.7 pg/mL (ref 0.0–100.0)

## 2015-08-23 LAB — I-STAT TROPONIN, ED: Troponin i, poc: 0.01 ng/mL (ref 0.00–0.08)

## 2015-08-23 IMAGING — CT CT ANGIO CHEST
2 of 6 series · 18 of 36 positions shown · IV contrast (Omni 300)
Comparison: Chest x-ray earlier today.

CLINICAL DATA: Shortness of breath for 2 days.

EXAM:
CT ANGIOGRAPHY CHEST WITH CONTRAST
TECHNIQUE: Multidetector CT imaging of the chest was performed using the
standard protocol during bolus administration of intravenous
contrast. Multiplanar CT image reconstructions and MIPs were
obtained to evaluate the vascular anatomy.
CONTRAST:  100mL OMNIPAQUE IOHEXOL 350 MG/ML SOLN

[Series 6: pe thins · axial · 0.76mm/px · z∈[+1174,+1446]mm · 17 of 601 slices shown]
[im 29/601  lung]
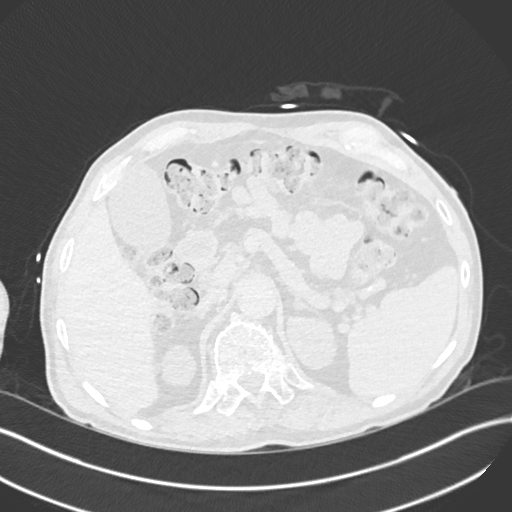
[im 58/601  mediastinal]
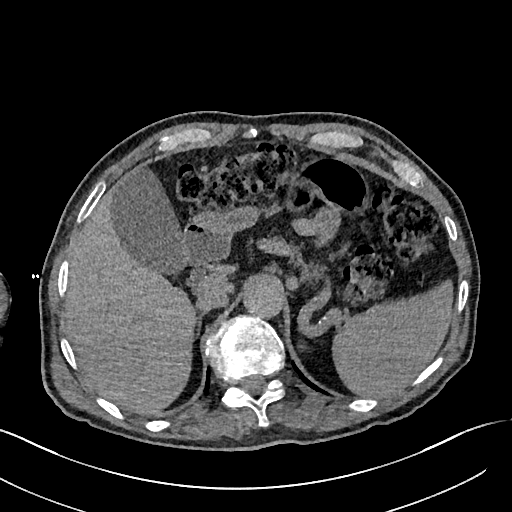
[im 86/601  lung]
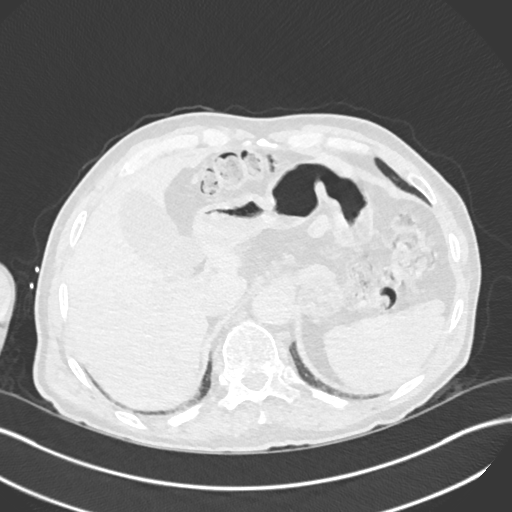
[im 143/601  mediastinal]
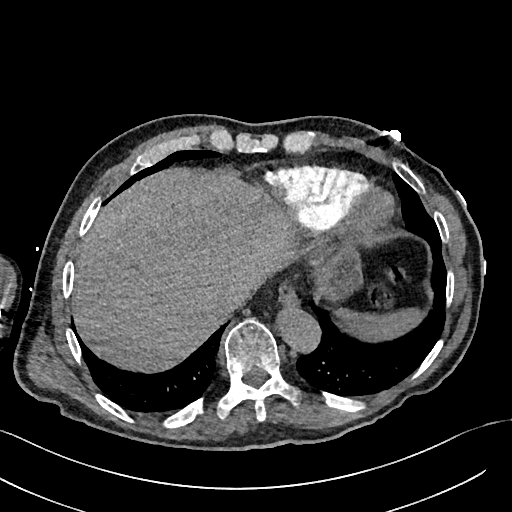
[im 172/601  lung]
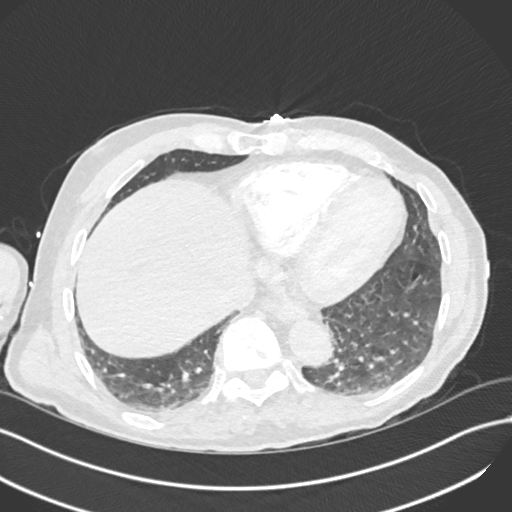
[im 201/601  mediastinal]
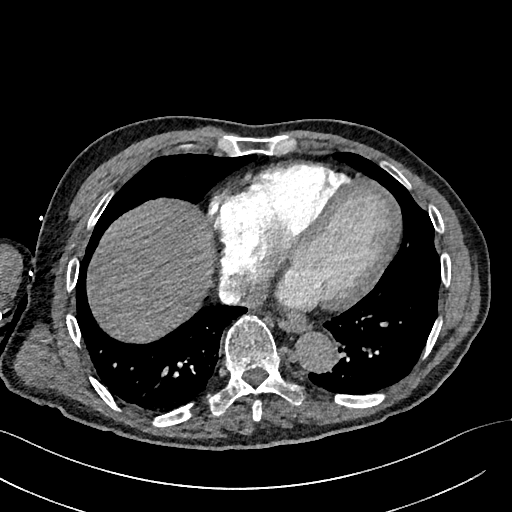
[im 229/601  lung]
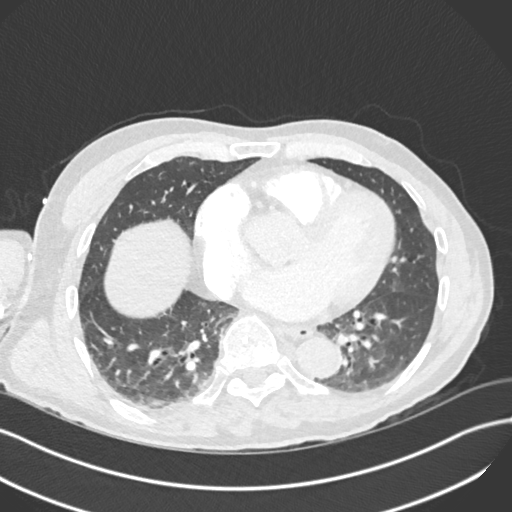
[im 258/601  mediastinal]
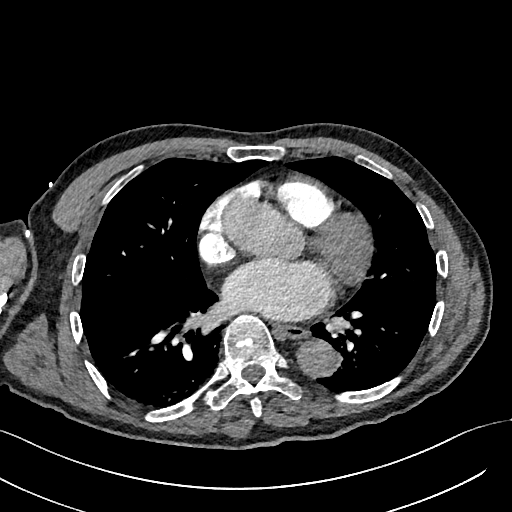
[im 315/601  lung]
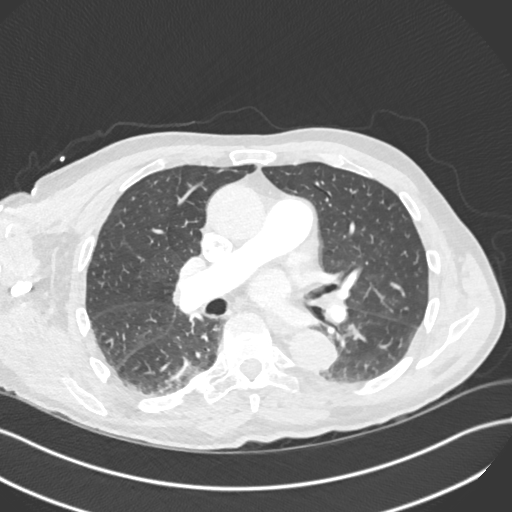
[im 343/601  mediastinal]
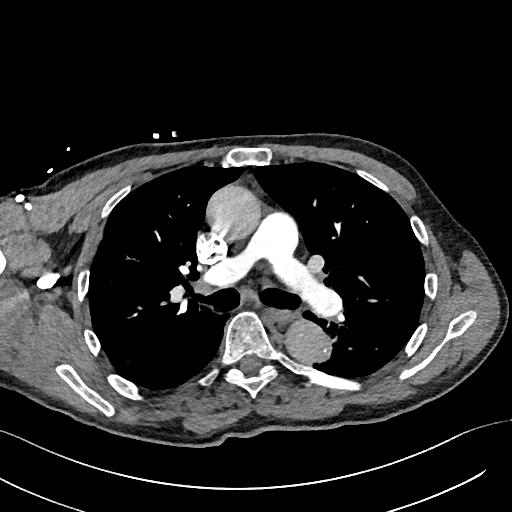
[im 372/601  lung]
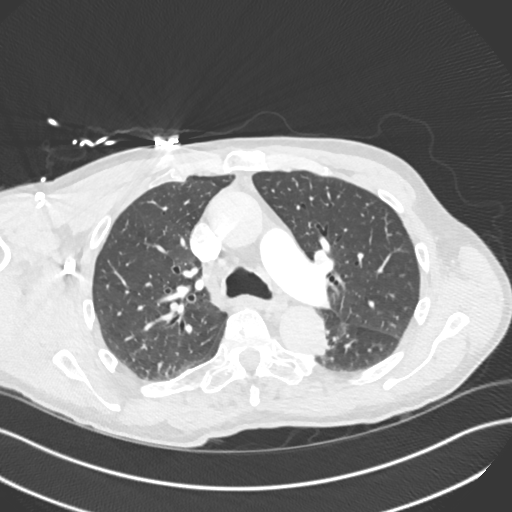
[im 401/601  mediastinal]
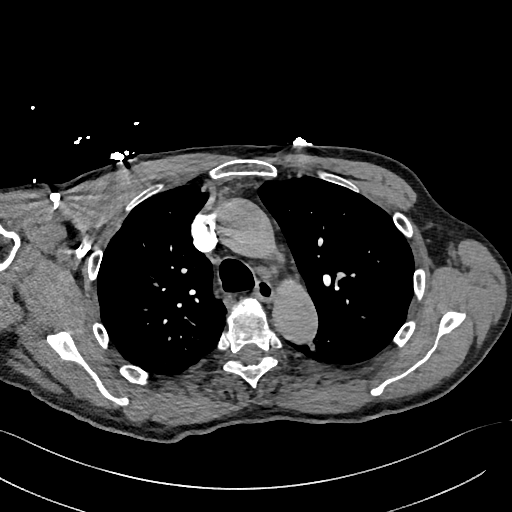
[im 429/601  lung]
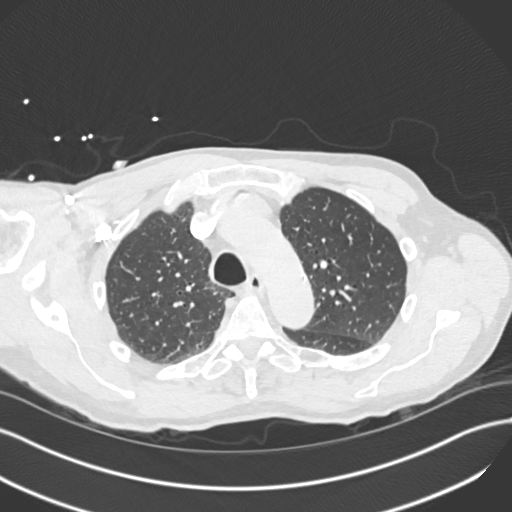
[im 458/601  mediastinal]
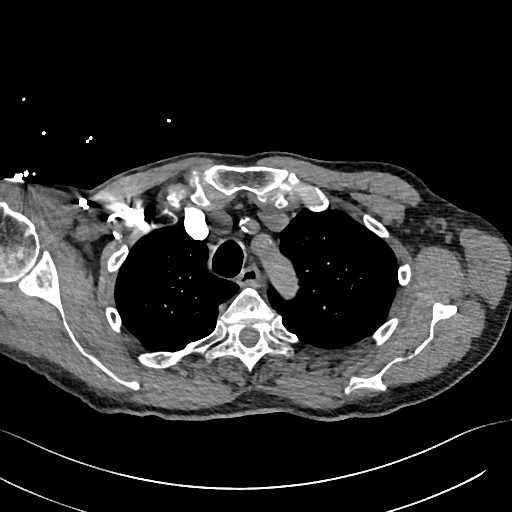
[im 515/601  lung]
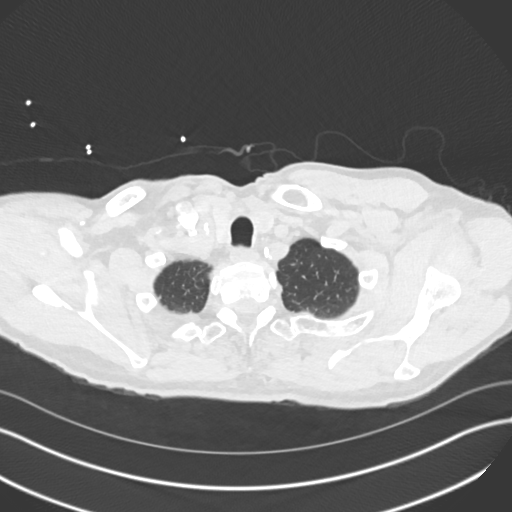
[im 543/601  mediastinal]
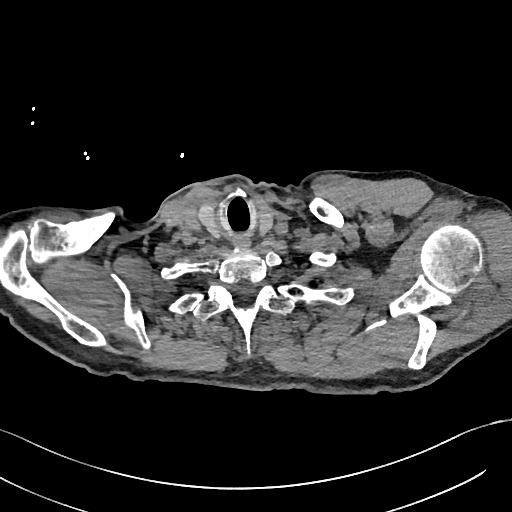
[im 572/601  lung]
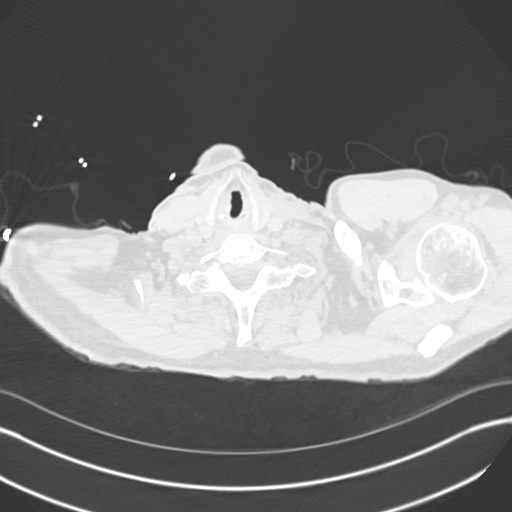

[Series 7: pe 2mm cor · coronal · 0.59mm/px · 1 of 121 slices shown]
[im 61/121  mediastinal]
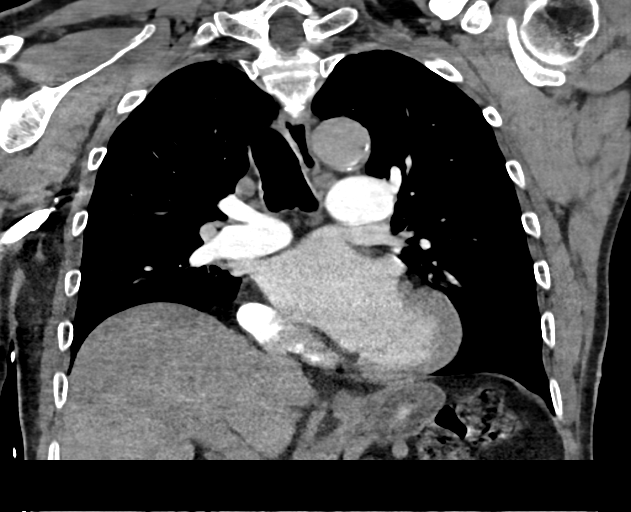

[18 of 36 positions shown; findings below may reference images not displayed]

FINDINGS: The pulmonary arteries are very well opacified. There is no evidence
of pulmonary embolism. Central pulmonary arteries are normal in
caliber. The heart size is within normal limits. Coronary arterial
calcification identified in the distribution of the LAD and left
circumflex coronary arteries. There may be at least one or more
coronary stents in these vascular distributions as well. No pleural
or pericardial fluid is identified.

Lungs show mild bibasilar atelectasis. No edema, airspace
consolidation, nodule or pneumothorax identified. Visualized airways
are normally patent.

The thoracic aorta is not very well opacified with contrast.
However, there clearly is at least mild aneurysmal dilatation of the
ascending thoracic aorta which measures 4.1 cm in greatest diameter.

No enlarged lymph nodes are identified. There is suggestion of a
subtle low-attenuation nodular area within the inferior aspect of
the left lobe of the thyroid gland. This is best appreciated on
coronal reconstructions and measures approximately 9 mm in diameter.
Visualized upper abdominal structures are unremarkable.

Review of the MIP images confirms the above findings.
IMPRESSION: 1. No evidence of pulmonary embolism.
2. No acute pulmonary process identified.
3. Mild aneurysmal dilatation of the ascending thoracic aorta,
measuring 4.1 cm in greatest diameter. Recommend semi-annual imaging
followup by CTA or MRA and referral to cardiothoracic surgery if not
already obtained. This recommendation follows [EE]
ACCF/AHA/AATS/ACR/ASA/SCA/NABA/NABA/NABA/NABA Guidelines for the
Diagnosis and Management of Patients With Thoracic Aortic Disease.
Circulation. [EE]; 121: [id number]
4. Coronary atherosclerosis with potential previously placed
coronary stents.
5. Suggestion of 9 mm low-attenuation nodule in the inferior left
lobe of the thyroid gland. Sub-centimeter thyroid nodule(s) noted,
too small to characterize, but most likely benign in the absence of
known clinical risk factors for thyroid carcinoma.

## 2015-08-23 IMAGING — CR DG CHEST 2V
3 series · 3 of 3 positions shown · non-contrast
Comparison: [DATE]

CLINICAL DATA: Shortness of Breath

EXAM:
CHEST - 2 VIEW

[chest lat]
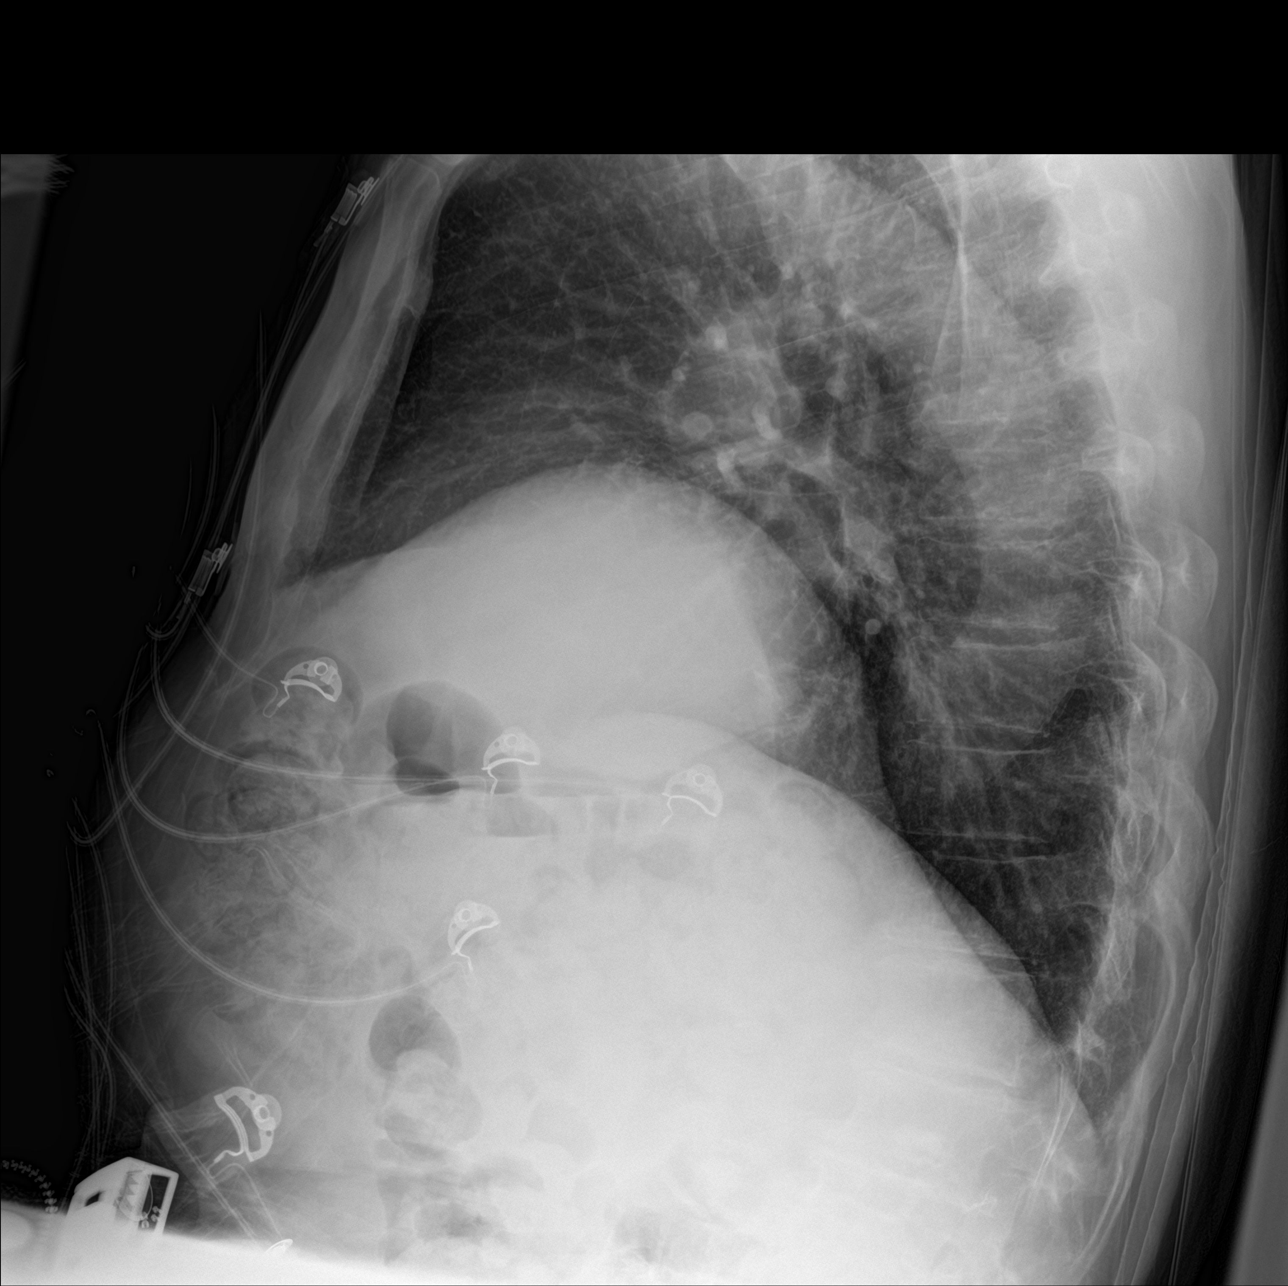

[chest ap (1 of 2)]
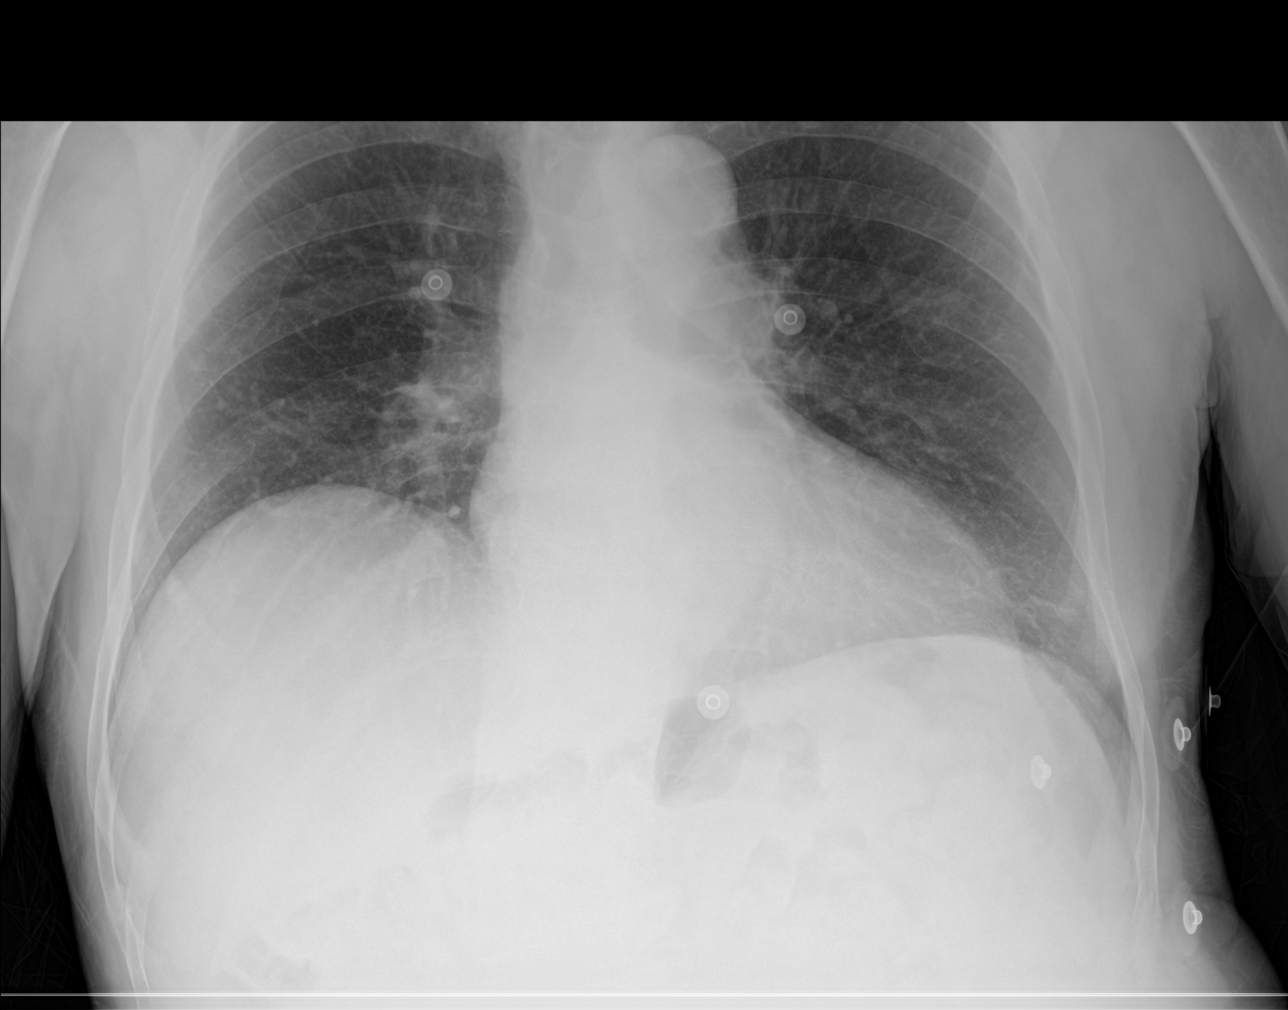

[chest ap (2 of 2)]
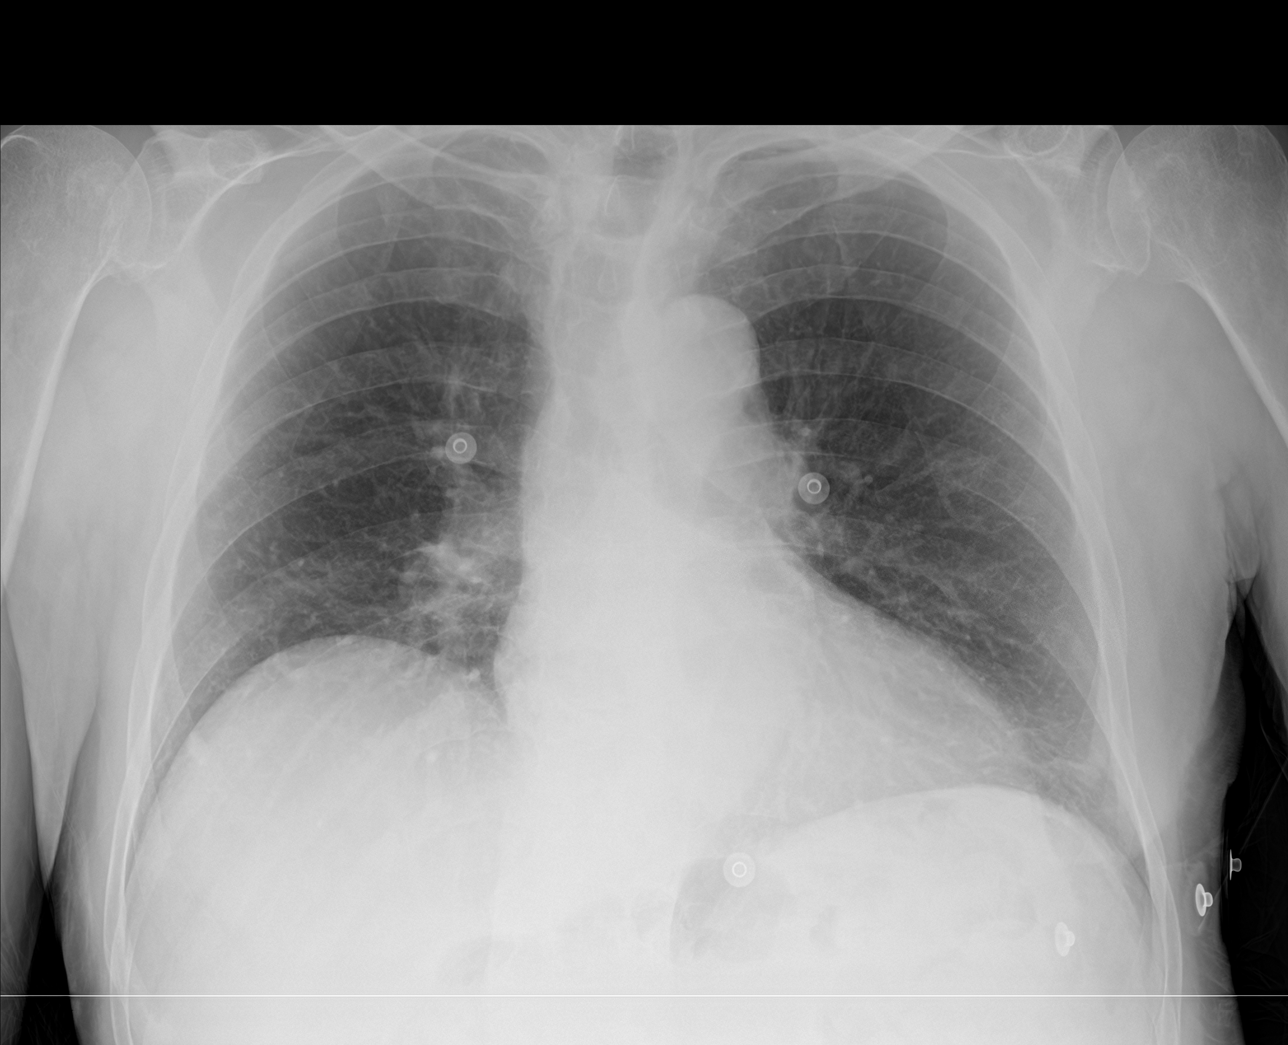

[3 of 3 positions shown; findings below may reference images not displayed]

FINDINGS: Cardiac shadow is within normal limits. The lungs are well aerated
bilaterally. Focal eventration of the right hemidiaphragm is seen.
No focal infiltrate or sizable effusion is noted. No acute bony
abnormality is seen.
IMPRESSION: No active disease.

## 2015-08-23 MED ORDER — IOHEXOL 350 MG/ML SOLN
100.0000 mL | Freq: Once | INTRAVENOUS | Status: AC | PRN
  Administered 2015-08-23: 100 mL via INTRAVENOUS

## 2015-08-23 MED ORDER — ACETAMINOPHEN 325 MG PO TABS
650.0000 mg | ORAL_TABLET | Freq: Once | ORAL | Status: AC
  Administered 2015-08-23: 650 mg via ORAL
  Filled 2015-08-23: qty 2

## 2015-08-23 NOTE — ED Notes (Signed)
Pt reports to the ED via [facility] for eval of SOB. Per EMS pt walked into the fire station where they are familiar with him complaining of sudden onset SOB with minimal exertion. Pt was noted to be diaphoretic, lethargic, and tachypnic and his O2 was 93% on RA. Per fire this is unusual for him. En route EMS noted very diminished breath sounds in bilateral lower lobes. He received 125 mg of solumedrol, 5 mg of albuterol, and 0.5 of Atrovent en route with mininal changes in his SOB. Pt denies any CP, cough, or chest trauma. Pt A&Ox4, resp e/u at this time, skin cool and clammy.

## 2015-08-23 NOTE — Discharge Instructions (Signed)
Please follow up closely with your doctor for further evaluation of your shortness of breath.  Your CT scan show evidence of a thoracic aneurysm.  Please discussed this with your doctor to have a repeat scan for further evaluation of the aneurysm.  Return to ER if your condition worsen or if you have other concerns.    Shortness of Breath Shortness of breath means you have trouble breathing. It could also mean that you have a medical problem. You should get immediate medical care for shortness of breath. CAUSES   Not enough oxygen in the air such as with high altitudes or a smoke-filled room.  Certain lung diseases, infections, or problems.  Heart disease or conditions, such as angina or heart failure.  Low red blood cells (anemia).  Poor physical fitness, which can cause shortness of breath when you exercise.  Chest or back injuries or stiffness.  Being overweight.  Smoking.  Anxiety, which can make you feel like you are not getting enough air. DIAGNOSIS  Serious medical problems can often be found during your physical exam. Tests may also be done to determine why you are having shortness of breath. Tests may include:  Chest X-rays.  Lung function tests.  Blood tests.  An electrocardiogram (ECG).  An ambulatory electrocardiogram. An ambulatory ECG records your heartbeat patterns over a 24-hour period.  Exercise testing.  A transthoracic echocardiogram (TTE). During echocardiography, sound waves are used to evaluate how blood flows through your heart.  A transesophageal echocardiogram (TEE).  Imaging scans. Your health care provider may not be able to find a cause for your shortness of breath after your exam. In this case, it is important to have a follow-up exam with your health care provider as directed.  TREATMENT  Treatment for shortness of breath depends on the cause of your symptoms and can vary greatly. HOME CARE INSTRUCTIONS   Do not smoke. Smoking is a common  cause of shortness of breath. If you smoke, ask for help to quit.  Avoid being around chemicals or things that may bother your breathing, such as paint fumes and dust.  Rest as needed. Slowly resume your usual activities.  If medicines were prescribed, take them as directed for the full length of time directed. This includes oxygen and any inhaled medicines.  Keep all follow-up appointments as directed by your health care provider. SEEK MEDICAL CARE IF:   Your condition does not improve in the time expected.  You have a hard time doing your normal activities even with rest.  You have any new symptoms. SEEK IMMEDIATE MEDICAL CARE IF:   Your shortness of breath gets worse.  You feel light-headed, faint, or develop a cough not controlled with medicines.  You start coughing up blood.  You have pain with breathing.  You have chest pain or pain in your arms, shoulders, or abdomen.  You have a fever.  You are unable to walk up stairs or exercise the way you normally do. MAKE SURE YOU:  Understand these instructions.  Will watch your condition.  Will get help right away if you are not doing well or get worse.   This information is not intended to replace advice given to you by your health care provider. Make sure you discuss any questions you have with your health care provider.   Document Released: 04/23/2001 Document Revised: 08/03/2013 Document Reviewed: 10/14/2011 Elsevier Interactive Patient Education 2016 [place].  Thoracic Aortic Aneurysm An aneurysm is a bulge in an artery. It  happens when the wall of the artery is weakened or damaged. If the aneurysm gets too big, it bursts (ruptures) and severe bleeding occurs. A thoracic aortic aneurysm is an aneurysm that occurs in the first part of the aorta, between the heart and the diaphragm. The aorta is the main artery and supplies blood from the heart to the rest of the body. A thoracic aortic aneurysm can enlarge and  rupture or blood can flow between the layers of the wall of the aorta through a tear (aorticdissection). Both of these conditions can cause bleeding inside the body and can be life threatening unless diagnosed and treated promptly. CAUSES  The exact cause of a thoracic aortic aneurysm is often unknown. Some contributing factors are:   A hardening of the arteries caused by the buildup of fat and other substances in the lining of a blood vessel (arteriosclerosis).  Inflammation of the walls of an artery (arteritis).  Connective tissue diseases, such as Marfan syndrome.  Injury or trauma to the aorta.  An infection, such as syphilis or staphylococcus, in the wall of the aorta (infectious aortitis) caused by bacteria. RISK FACTORS  Risk factors that contribute to a thoracic aortic aneurysm may include:  Age older than 58 years.  High blood pressure (hypertension).  Male gender.  Ethnicity (white race).  Obesity.  Family history of aneurysm (first degree relatives only).  Tobacco use. PREVENTION  The following healthy lifestyle habits may help decrease your risk of a thoracic aortic aneurysm:  Quitting smoking. Smoking can raise your blood pressure and cause arteriosclerosis.  Limiting or avoiding alcohol.  Keeping your blood pressure, blood sugar level, and cholesterol levels within normal limits.  Decreasing your salt intake. In some people, too much salt can raise blood pressure and increase your risk of abdominal aortic aneurysm.  Eating a diet low in saturated fats and cholesterol.  Increasing your fiber intake by including whole grains, vegetables, and fruits in your diet. Eating these foods may help lower blood pressure.  Maintaining a healthy weight.  Staying physically active and exercising regularly. SYMPTOMS  The symptoms of thoracic aortic aneurysm may vary depending on the size and rate of growth of the aneurysm. Most grow slowly and do not have any symptoms.  When symptoms do occur, they may include:  Pain (chest, back, sides, or abdomen). The pain may vary in intensity. A sudden onset of severe pain may indicate that the aneurysm has ruptured.  Hoarseness.  Cough.  Shortness of breath.  Swallowing problems.  Nausea or vomiting or both. DIAGNOSIS  Since most unruptured thoracic aortic aneurysms have no symptoms, they are often discovered during diagnostic exams for other conditions. An aneurysm may be found during the following procedures:  Ultrasonography (a one-time screening for thoracic aortic aneurysm by ultrasonography is also recommended for all men aged 6-75 years who have ever smoked).  X-ray exams.  A CT scan.  An MRI.  Angiography or arteriography. TREATMENT  Treatment of a thoracic aortic aneurysm depends on the size of your aneurysm, your age, and risk factors for rupture. Medicine to control blood pressure and pain may be used to manage aneurysms smaller than 2.3 in (6 cm). Regular monitoring for enlargement may be recommended by your health care provider if:  The aneurysm is 1.2-1.5 in (3-4 cm) in size (an annual ultrasonography may be recommended).  The aneurysm is 1.5-1.8 in (4-4.5 cm) in size (an ultrasonography every 6 months may be recommended).  The aneurysm is larger than 1.8  in (4.5 cm) in size (your health care provider may ask that you be examined by a vascular surgeon). If your aneurysm is larger than 2.2 in (5.5 cm) or if it is enlarging quickly, surgical repair may be recommended. There are two main methods for repair of an aneurysm:   Endovascular repair (a minimally invasive surgery).  Open repair. This method is used if an endovascular repair is not possible.   This information is not intended to replace advice given to you by your health care provider. Make sure you discuss any questions you have with your health care provider.   Document Released: 07/29/2005 Document Revised: 05/19/2013 Document  Reviewed: 02/08/2013 Elsevier Interactive Patient Education [employer].

## 2015-08-23 NOTE — ED Provider Notes (Signed)
CSN: [id number]     Arrival date & time 08/23/15  1219 History   First MD Initiated Contact with Patient 08/23/15 1302     Chief Complaint  Patient presents with  . Shortness of Breath     (Consider location/radiation/quality/duration/timing/severity/associated sxs/prior Treatment) HPI   75 year old male brought here via EMS from a fire station for evaluation of acute onset of shortness of breath. Per EMS note, patient walked into a fire station complaining of shortness of breath and appears to be diaphoretic, lethargic and tachypneic with an O2 of 93% some room air. Patient receive 125 mg of Solu-Medrol, 5 mg of albuterol, and 0.5 mg of Atrovent on Route with minimal improvement. Patient states that for the past 4-5 days he has had persistent shortness of breath increased with exertion. Report feeling dizzy, generalized weak, having occipital headache, and occasional diaphoresis. Symptoms not improving. Denies having fever, vision changes, URI symptoms, productive cough, pleuritic chest pain, abdominal pain nausea vomiting or diarrhea. No recent hospitalization. No prior history of PE or DVT. No significant cardiac history, and patient is a nonsmoker. 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 Patient denies any history of CHF however states that he sleep with 5 pillows at night.  Past Medical History  Diagnosis Date  . Nerve pain    Past Surgical History  Procedure Laterality Date  . Back surgey     No family history on file. Social History  Substance Use Topics  . Smoking status: Never Smoker   . Smokeless tobacco: None  . Alcohol Use: No    Review of Systems  All other systems reviewed and are negative.     Allergies  Review of patient's allergies indicates no known allergies.  Home Medications   Prior to Admission medications   Medication Sig Start Date End Date Taking? Authorizing Provider  cholecalciferol (VITAMIN D) 1000 units tablet  Take 1,000 Units by mouth daily.   Yes Historical Provider, MD  gabapentin (NEURONTIN) 800 MG tablet Take 800 mg by mouth 3 (three) times daily.   Yes Historical Provider, MD  HYDROcodone-acetaminophen (NORCO/VICODIN) 5-325 MG per tablet Take 1-2 tablets by mouth every 6 (six) hours as needed for moderate pain. 02/08/14  Yes Fredia Sorrow, MD  naproxen (NAPROSYN) 500 MG tablet Take 500 mg by mouth 2 (two) times daily with a meal.   Yes Historical Provider, MD  OXYCONTIN 40 MG 12 hr tablet Take 40 mg by mouth 3 (three) times daily. 08/03/15  Yes Historical Provider, MD  promethazine (PHENERGAN) 25 MG tablet Take 1 tablet by mouth every 4 (four) hours as needed.  08/02/15  Yes Historical Provider, MD  traZODone (DESYREL) 50 MG tablet Take 50 mg by mouth at bedtime.   Yes Historical Provider, MD  cyclobenzaprine (FLEXERIL) 10 MG tablet Take 1 tablet (10 mg total) by mouth 2 (two) times daily as needed for muscle spasms. Patient not taking: Reported on 08/23/2015 02/08/14   Fredia Sorrow, MD   BP 134/71 mmHg  Pulse 65  Temp(Src) 98.6 F (37 C) (Oral)  Resp 20  SpO2 94% Physical Exam  Constitutional: He is oriented to person, place, and time. He appears well-developed and well-nourished. No distress.  Elderly Caucasian male appears fatigued.  HENT:  Head: Atraumatic.  Right Ear: External ear normal.  Left Ear: External ear normal.  Nose: Nose normal.  Mouth/Throat: Oropharynx is clear and moist.  Eyes: Conjunctivae are normal.  Neck: Neck supple. No JVD present.  No  nuchal rigidity.  Cardiovascular: Normal rate, regular rhythm and intact distal pulses.   Pulmonary/Chest:  Decreased breath sounds with crackles heard at lung bases bilaterally  Abdominal: Soft. There is no tenderness.  Musculoskeletal: He exhibits no edema.  Neurological: He is alert and oriented to person, place, and time. He has normal strength. No cranial nerve deficit or sensory deficit. Coordination and gait normal.  GCS eye subscore is 4. GCS verbal subscore is 5. GCS motor subscore is 6.  Skin: No rash noted.  Psychiatric: He has a normal mood and affect.  Nursing note and vitals reviewed.   ED Course  Procedures (including critical care time) Labs Review Labs Reviewed  BASIC METABOLIC PANEL - Abnormal; Notable for the following:    Glucose, Bld 133 (*)    All other components within normal limits  CBC - Abnormal; Notable for the following:    Platelets 125 (*)    All other components within normal limits  BRAIN NATRIURETIC PEPTIDE  I-STAT TROPOININ, ED    Imaging Review Dg Chest 2 View  08/23/2015  CLINICAL DATA:  Shortness of Breath EXAM: CHEST - 2 VIEW COMPARISON:  02/08/2014 FINDINGS: Cardiac shadow is within normal limits. The lungs are well aerated bilaterally. Focal eventration of the right hemidiaphragm is seen. No focal infiltrate or sizable effusion is noted. No acute bony abnormality is seen. IMPRESSION: No active disease. Electronically Signed   By: Inez Catalina M.D.   On: 08/23/2015 12:55   I have personally reviewed and evaluated these images and lab results as part of my medical decision-making.   EKG Interpretation None      Date: 08/23/2015  Rate: 66  Rhythm: normal sinus rhythm  QRS Axis: normal  Intervals: normal  ST/T Wave abnormalities: normal  Conduction Disutrbances: none  Narrative Interpretation:   Old EKG Reviewed: No significant changes noted EKG reviewed by ME.     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 He has diminished breath sounds on exam. Workup initiated.  Will consider chest CT angio to r/o PE.    2:35 PM While ambulating, pt's O2 drops to 88% and he became SOB.  [place] 2L initiated. Pt  currently awaits chest CTA.  EKG without acute ischemic changes, troponin normal.  Labs are reassuring, normal BNP.  CXR unremarkable.   4:17 PM Chest CT angiogram showing no evidence of PE. Mild aneurysmal dilatation of the ascending thoracic aorta measuring 4.1 cm were noted. Recommendation include semiannual imaging review including CTA or MRA for further management. I have ambulate patient in the ER and he was asymptomatic and maintaining adequate oxygenation. At this time patient agrees to follow-up closely with PCP for further care. Patient initially did complain of occipital headache improves with Tylenol. He has no focal neuro deficit, no carotid bruit, no other concerning neurological finding.  Care discussed with Dr. Venora Maples.  Domenic Moras, PA-C 08/23/15 [place], MD 08/23/15 [phone]

## 2015-08-29 ENCOUNTER — Telehealth: Payer: Self-pay | Admitting: Internal Medicine

## 2015-08-29 NOTE — Telephone Encounter (Signed)
Received records from [facility] for appointment with Dr Debara Pickett on 09/22/15.  Records given to [facility] (medical records) for Dr Lysbeth Penner schedule on 09/22/15. lp

## 2015-09-22 ENCOUNTER — Ambulatory Visit: Payer: BC Managed Care – PPO | Admitting: Internal Medicine

## 2015-10-03 ENCOUNTER — Ambulatory Visit (INDEPENDENT_AMBULATORY_CARE_PROVIDER_SITE_OTHER): Payer: Medicare Other | Admitting: Cardiovascular Disease

## 2015-10-03 ENCOUNTER — Encounter: Payer: Self-pay | Admitting: Cardiovascular Disease

## 2015-10-03 VITALS — BP 114/68 | HR 61 | Ht 67.0 in | Wt 155.0 lb

## 2015-10-03 DIAGNOSIS — R0602 Shortness of breath: Secondary | ICD-10-CM

## 2015-10-03 DIAGNOSIS — I712 Thoracic aortic aneurysm, without rupture, unspecified: Secondary | ICD-10-CM | POA: Insufficient documentation

## 2015-10-03 DIAGNOSIS — I2510 Atherosclerotic heart disease of native coronary artery without angina pectoris: Secondary | ICD-10-CM | POA: Insufficient documentation

## 2015-10-03 DIAGNOSIS — I2584 Coronary atherosclerosis due to calcified coronary lesion: Secondary | ICD-10-CM

## 2015-10-03 NOTE — Assessment & Plan Note (Signed)
Riley Eaton recently had a chest CT which was notable for a 4.1 cm thoracic aortic aneurysm. This is way below the size necessary for revascularization (5 cm to 5.5 cm) but will require annual screening CT scans to follow potential growth.

## 2015-10-03 NOTE — Assessment & Plan Note (Signed)
Riley Eaton recently had a CT scan performed 08/23/15 in the setting of hypertension diaphoresis and shortness of breath to rule out pulmonary embolus and which was negative. There was noted coronary calcification in the LAD and circumflex territories. The patient is otherwise asymptomatic. I'm going to get a pharmacologic Myoview stress test to rule out physiologic significance.

## 2015-10-03 NOTE — Progress Notes (Signed)
10/03/2015 Riley Eaton   01/15/1941  [id number]  Primary Physician Bing Matter, PA-C Primary Cardiologist: Lorretta Harp MD Renae Gloss   HPI:  Riley Eaton is a 75 year old mildly overweight married Caucasian male who is accompanied by his wife Riley Eaton. He is the father of 108, grandfather of 4 grandchildren. Retired from being in the [employer]. He is referred by Leonia Reader PA-C at [facility] for cardiovascular evaluation because of an episode of shortness of breath, and an incidentally noted thoracic aortic aneurysm. He basically has no cardiac risk factors. He's never had a heart attack or stroke. He denies chest pain or shortness of breath.  Current Outpatient Prescriptions  Medication Sig Dispense Refill  . cholecalciferol (VITAMIN D) 1000 units tablet Take 1,000 Units by mouth daily.    [name] gabapentin (NEURONTIN) 800 MG tablet Take 800 mg by mouth 3 (three) times daily.    [name] HYDROcodone-acetaminophen (NORCO/VICODIN) 5-325 MG per tablet Take 1-2 tablets by mouth every 6 (six) hours as needed for moderate pain. 20 tablet 0  . omeprazole (PRILOSEC) 20 MG capsule Take 20 mg by mouth 2 (two) times daily before a meal.    . OXYCONTIN 40 MG 12 hr tablet Take 40 mg by mouth 3 (three) times daily.  0  . promethazine (PHENERGAN) 25 MG tablet Take 1 tablet by mouth every 4 (four) hours as needed.   6  . tamsulosin (FLOMAX) 0.4 MG CAPS capsule Take 0.4 mg by mouth daily.    . traZODone (DESYREL) 50 MG tablet Take 50 mg by mouth at bedtime.     No current facility-administered medications for this visit.    No Known Allergies  Social History   Social History  . Marital Status: Married    Spouse Name: N/A  . Number of Children: N/A  . Years of Education: N/A   Occupational History  . Not on file.   Social History Main Topics  . Smoking status: Never Smoker   . Smokeless tobacco: Not on file  . Alcohol Use: No  . Drug Use: No  . Sexual Activity: Not on  file   Other Topics Concern  . Not on file   Social History Narrative     Review of Systems: General: negative for chills, fever, night sweats or weight changes.  Cardiovascular: negative for chest pain, dyspnea on exertion, edema, orthopnea, palpitations, paroxysmal nocturnal dyspnea or shortness of breath Dermatological: negative for rash Respiratory: negative for cough or wheezing Urologic: negative for hematuria Abdominal: negative for nausea, vomiting, diarrhea, bright red blood per rectum, melena, or hematemesis Neurologic: negative for visual changes, syncope, or dizziness All other systems reviewed and are otherwise negative except as noted above.    Blood pressure 114/68, pulse 61, height 5\' 7"  (1.702 m), weight 155 lb (70.308 kg).  General appearance: alert and no distress Neck: no adenopathy, no carotid bruit, no JVD, supple, symmetrical, trachea midline and thyroid not enlarged, symmetric, no tenderness/mass/nodules Lungs: clear to auscultation bilaterally Heart: regular rate and rhythm, S1, S2 normal, no murmur, click, rub or gallop Extremities: extremities normal, atraumatic, no cyanosis or edema  EKG sinus rhythm at 61 without ST or T-wave changes. I personally reviewed the EKG  ASSESSMENT AND PLAN:   Thoracic aortic aneurysm [facility]) Riley Eaton recently had a chest CT which was notable for a 4.1 cm thoracic aortic aneurysm. This is way below the size necessary for revascularization (5 cm to 5.5 cm) but will require annual screening CT scans  to follow potential growth.  Coronary artery calcification seen on CT scan Riley Eaton recently had a CT scan performed 08/23/15 in the setting of hypertension diaphoresis and shortness of breath to rule out pulmonary embolus and which was negative. There was noted coronary calcification in the LAD and circumflex territories. The patient is otherwise asymptomatic. I'm going to get a pharmacologic Myoview stress test to rule out  physiologic significance.      Lorretta Harp MD FACP,FACC,FAHA, [facility] 10/03/2015 2:05 PM

## 2015-10-03 NOTE — Patient Instructions (Signed)
Medication Instructions:  Your physician recommends that you continue on your current medications as directed. Please refer to the Current Medication list given to you today.   Labwork: I will get your lab work from your Primary Care Physician.   Testing/Procedures: Your physician has requested that you have a lexiscan myoview. For further information please visit [email or web address]. Please follow instruction sheet, as given.    Follow-Up: Your physician wants you to follow-up in: 12 months with Dr. Gwenlyn Found. You will receive a reminder letter in the mail two months in advance. If you don't receive a letter, please call our office to schedule the follow-up appointment.   Any Other Special Instructions Will Be Listed Below (If Applicable).     If you need a refill on your cardiac medications before your next appointment, please call your pharmacy.

## 2015-10-18 ENCOUNTER — Telehealth (HOSPITAL_COMMUNITY): Payer: Self-pay

## 2015-10-18 NOTE — Telephone Encounter (Signed)
Encounter complete. 

## 2015-10-20 ENCOUNTER — Ambulatory Visit (HOSPITAL_COMMUNITY)
Admission: RE | Admit: 2015-10-20 | Discharge: 2015-10-20 | Disposition: A | Discharge status: Home / Self Care | Payer: Medicare Other | Source: Ambulatory Visit | Attending: Cardiovascular Disease | Admitting: Cardiovascular Disease

## 2015-10-20 DIAGNOSIS — I2584 Coronary atherosclerosis due to calcified coronary lesion: Secondary | ICD-10-CM | POA: Diagnosis not present

## 2015-10-20 DIAGNOSIS — R0602 Shortness of breath: Secondary | ICD-10-CM | POA: Insufficient documentation

## 2015-10-20 DIAGNOSIS — I2510 Atherosclerotic heart disease of native coronary artery without angina pectoris: Secondary | ICD-10-CM | POA: Insufficient documentation

## 2015-10-20 DIAGNOSIS — R0609 Other forms of dyspnea: Secondary | ICD-10-CM | POA: Diagnosis not present

## 2015-10-20 LAB — MYOCARDIAL PERFUSION IMAGING
LV dias vol: 127 mL (ref 62–150)
LV sys vol: 55 mL
Peak HR: 76 {beats}/min
Rest HR: 48 {beats}/min
SDS: 1
SRS: 0
SSS: 1
TID: 1.11

## 2015-10-20 IMAGING — NM NM MISC PROCEDURE
6 series · 36 of 36 positions shown · non-contrast
Comparison: none

[Series 1: wbr rest · 6.40mm/px · 6 of 64 frames shown]
[frame 6/64]
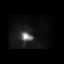
[frame 16/64]
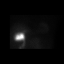
[frame 27/64]
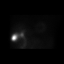
[frame 38/64]
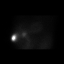
[frame 48/64]
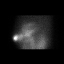
[frame 59/64]
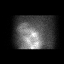

[Series 1: wbr_r-proj_st wbr rest · 6.40mm/px · 6 of 64 frames shown]
[frame 6/64]
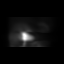
[frame 16/64]
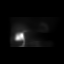
[frame 27/64]
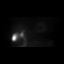
[frame 38/64]
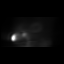
[frame 48/64]
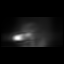
[frame 59/64]
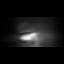

[Series 2: wbr stress-gsp · 6.40mm/px · 6 of 512 frames shown]
[frame 43/512]
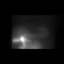
[frame 128/512]
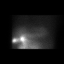
[frame 214/512]
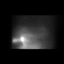
[frame 299/512]
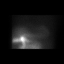
[frame 384/512]
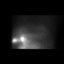
[frame 470/512]
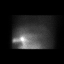

[Series 2: wbr_s-proj_st wbr stress-gsp · 6.40mm/px · 6 of 512 frames shown]
[frame 43/512]
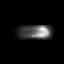
[frame 128/512]
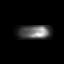
[frame 214/512]
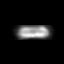
[frame 299/512]
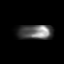
[frame 384/512]
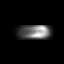
[frame 470/512]
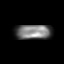

[Series 3: wbr stress-sum-em · 6.40mm/px · 6 of 64 frames shown]
[frame 6/64]
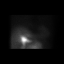
[frame 16/64]
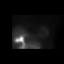
[frame 27/64]
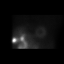
[frame 38/64]
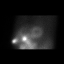
[frame 48/64]
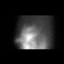
[frame 59/64]
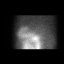

[Series 3: wbr_s-proj_st wbr stress-sum-em · 6.40mm/px · 6 of 64 frames shown]
[frame 6/64]
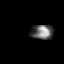
[frame 16/64]
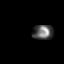
[frame 27/64]
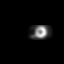
[frame 38/64]
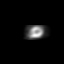
[frame 48/64]
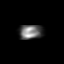
[frame 59/64]
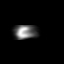

[36 of 36 positions shown; findings below may reference images not displayed]

Canned report from images found in remote index.

Refer to host system for actual result text.

## 2015-10-20 MED ORDER — REGADENOSON 0.4 MG/5ML IV SOLN
0.4000 mg | Freq: Once | INTRAVENOUS | Status: AC
  Administered 2015-10-20: 0.4 mg via INTRAVENOUS

## 2015-10-20 MED ORDER — TECHNETIUM TC 99M SESTAMIBI GENERIC - CARDIOLITE
29.5000 | Freq: Once | INTRAVENOUS | Status: AC | PRN
  Administered 2015-10-20: 29.5 via INTRAVENOUS

## 2015-10-20 MED ORDER — TECHNETIUM TC 99M SESTAMIBI GENERIC - CARDIOLITE
10.3000 | Freq: Once | INTRAVENOUS | Status: AC | PRN
  Administered 2015-10-20: 10.3 via INTRAVENOUS

## 2016-03-13 ENCOUNTER — Emergency Department (HOSPITAL_BASED_OUTPATIENT_CLINIC_OR_DEPARTMENT_OTHER): Payer: Medicare Other

## 2016-03-13 ENCOUNTER — Emergency Department (HOSPITAL_BASED_OUTPATIENT_CLINIC_OR_DEPARTMENT_OTHER)
Admission: EM | Admit: 2016-03-13 | Discharge: 2016-03-13 | Disposition: A | Discharge status: Home / Self Care | Payer: Medicare Other | Attending: Physician Assistant | Admitting: Physician Assistant

## 2016-03-13 ENCOUNTER — Encounter (HOSPITAL_BASED_OUTPATIENT_CLINIC_OR_DEPARTMENT_OTHER): Payer: Self-pay | Admitting: *Deleted

## 2016-03-13 DIAGNOSIS — G8929 Other chronic pain: Secondary | ICD-10-CM | POA: Insufficient documentation

## 2016-03-13 DIAGNOSIS — Z79899 Other long term (current) drug therapy: Secondary | ICD-10-CM | POA: Insufficient documentation

## 2016-03-13 DIAGNOSIS — M5442 Lumbago with sciatica, left side: Secondary | ICD-10-CM | POA: Insufficient documentation

## 2016-03-13 DIAGNOSIS — Z9889 Other specified postprocedural states: Secondary | ICD-10-CM | POA: Insufficient documentation

## 2016-03-13 DIAGNOSIS — M545 Low back pain: Secondary | ICD-10-CM | POA: Diagnosis present

## 2016-03-13 HISTORY — DX: Other chronic pain: G89.29

## 2016-03-13 HISTORY — DX: Dorsalgia, unspecified: M54.9

## 2016-03-13 LAB — URINALYSIS, ROUTINE W REFLEX MICROSCOPIC
Bilirubin Urine: NEGATIVE
Glucose, UA: NEGATIVE mg/dL
Hgb urine dipstick: NEGATIVE
Ketones, ur: NEGATIVE mg/dL
Leukocytes, UA: NEGATIVE
Nitrite: NEGATIVE
Protein, ur: NEGATIVE mg/dL
Specific Gravity, Urine: 1.02 (ref 1.005–1.030)
pH: 5.5 (ref 5.0–8.0)

## 2016-03-13 IMAGING — CR DG LUMBAR SPINE COMPLETE 4+V
5 series · 5 of 5 positions shown · non-contrast
Comparison: None.

CLINICAL DATA: Chronic low back pain.

EXAM:
LUMBAR SPINE - COMPLETE 4+ VIEW

[t l-spine a.p.]
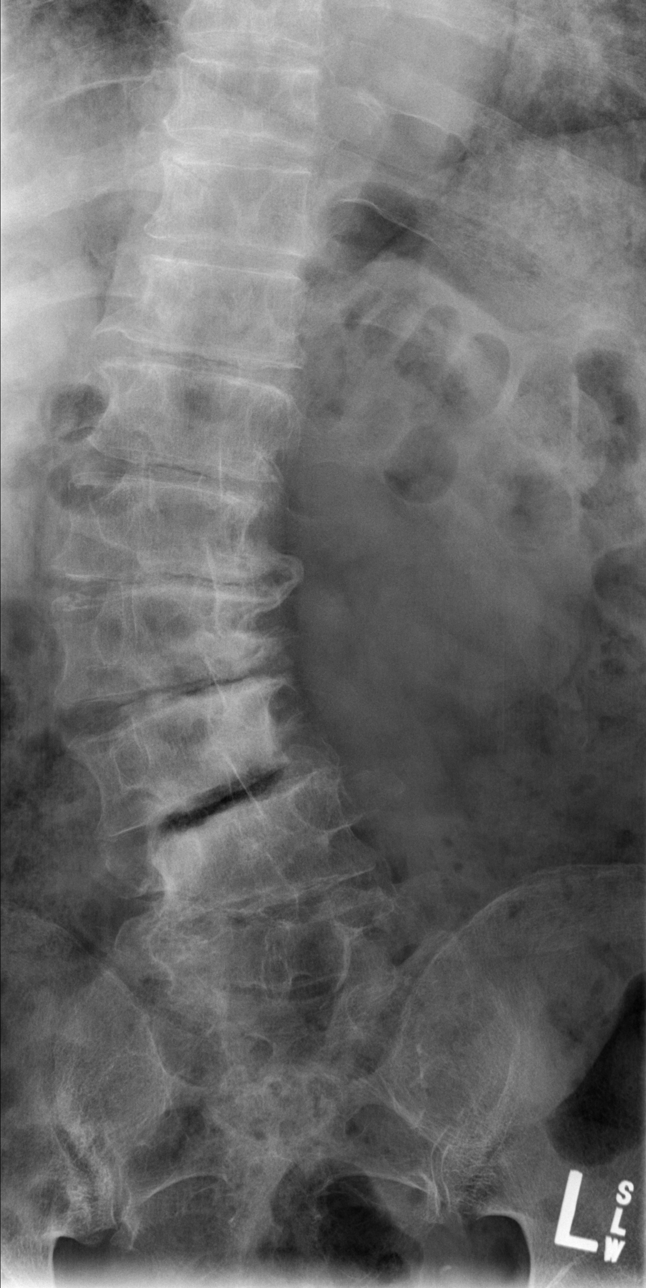

[t l-spine oblique exposure (1 of 2)]
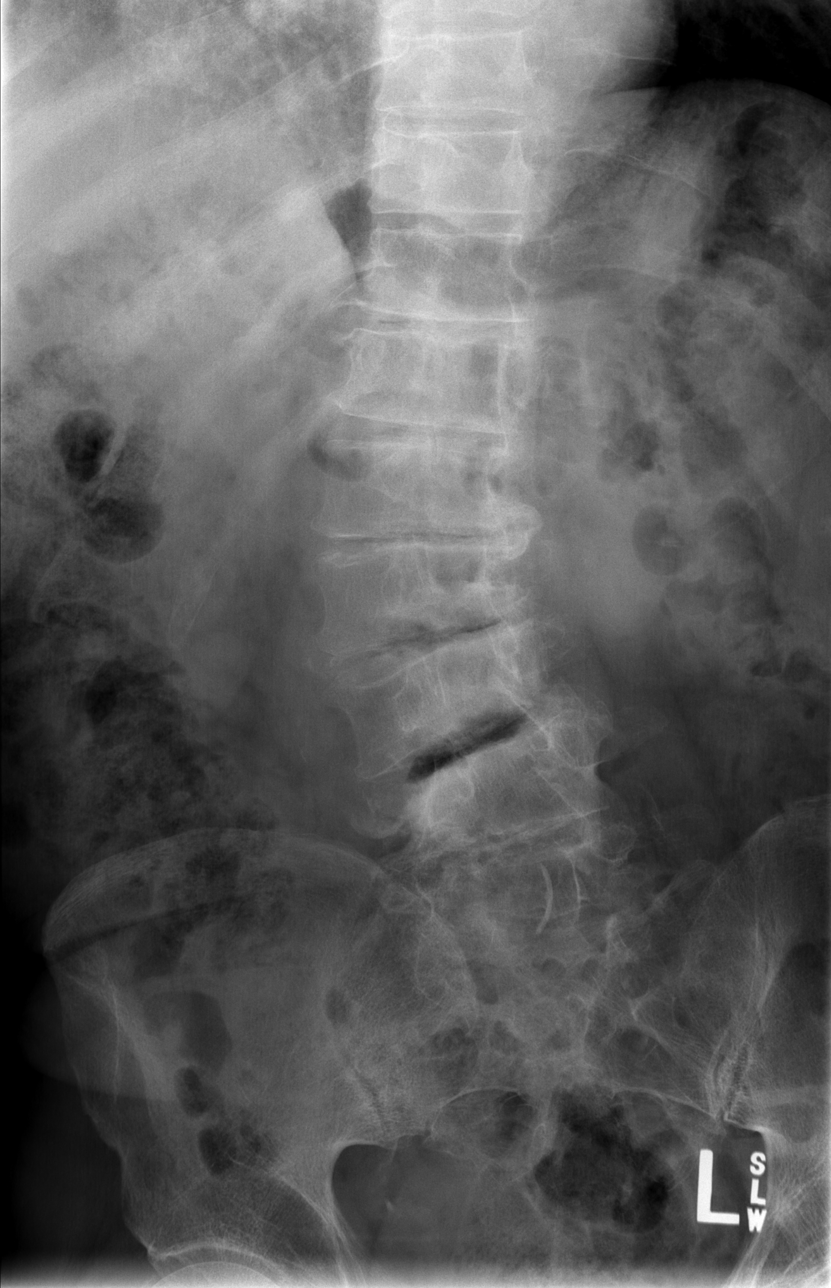

[t l-spine oblique exposure (2 of 2)]
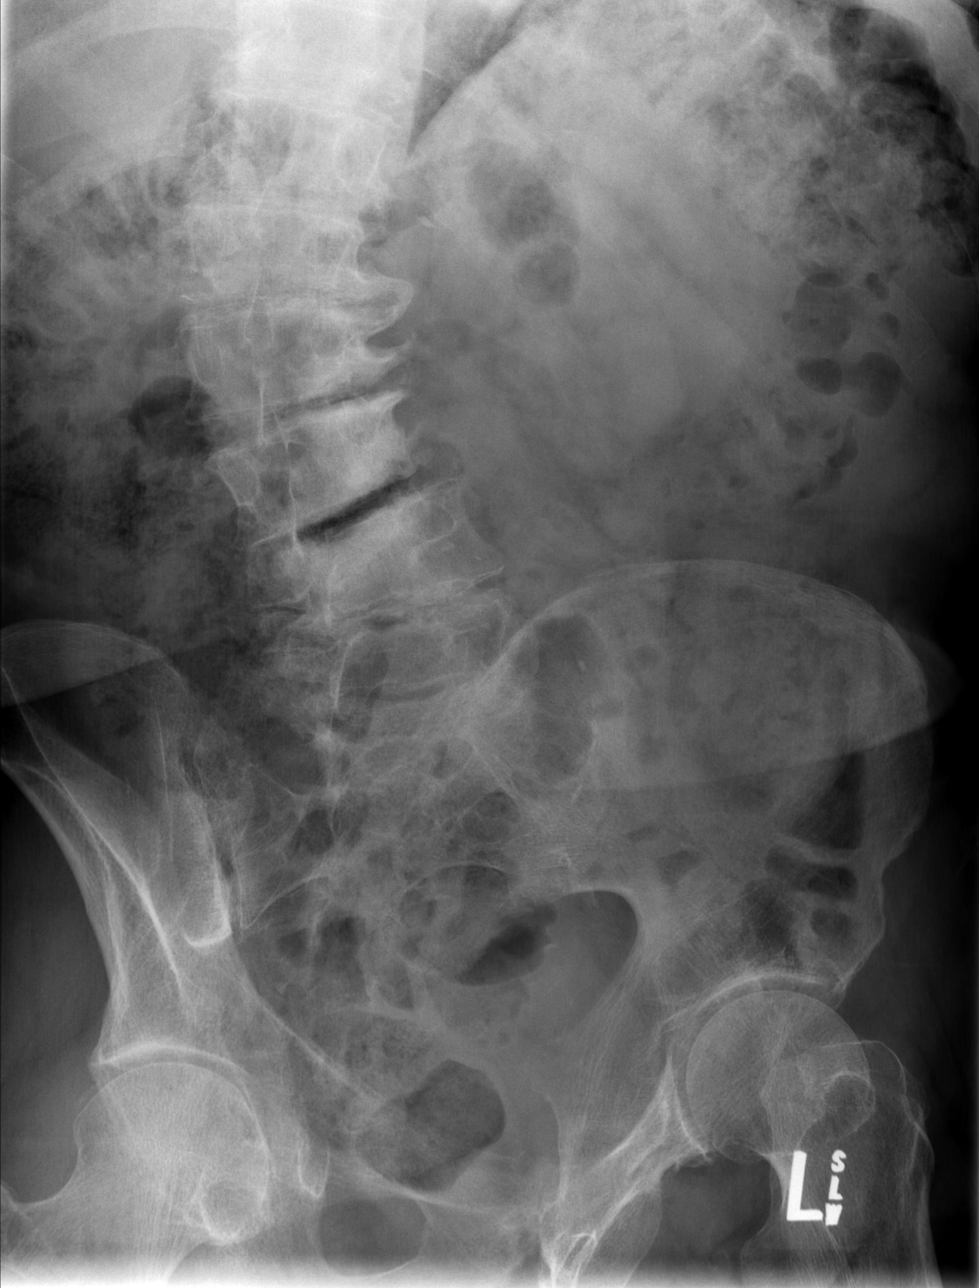

[t l-spine lat]
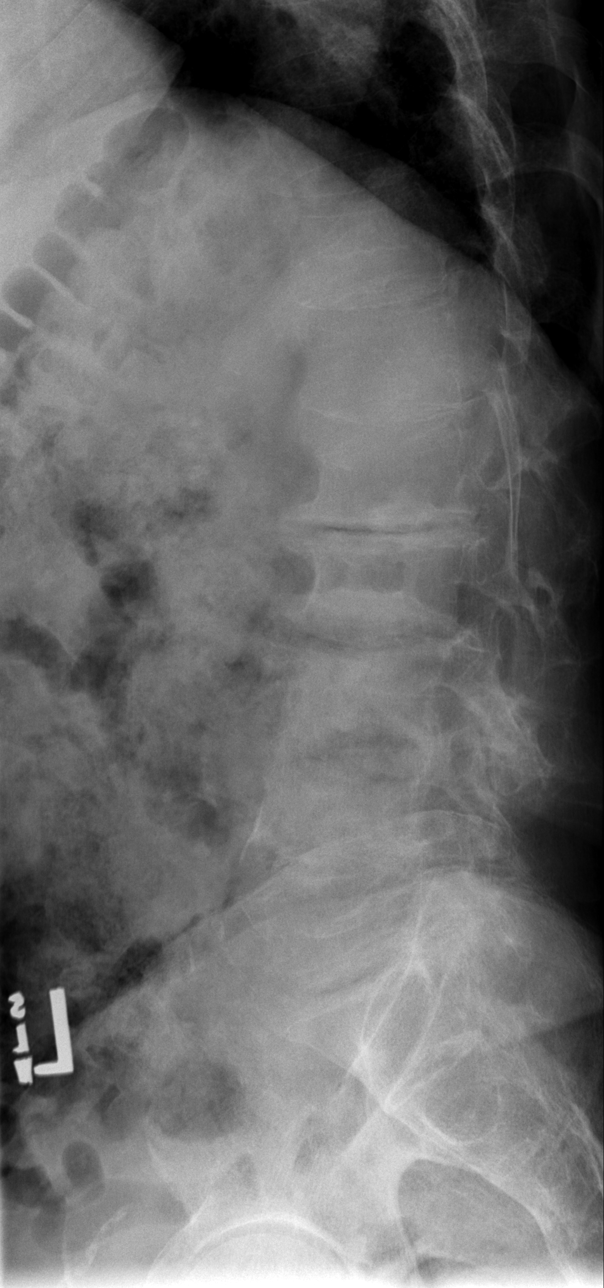

[t l-spine l5-s1 spot]
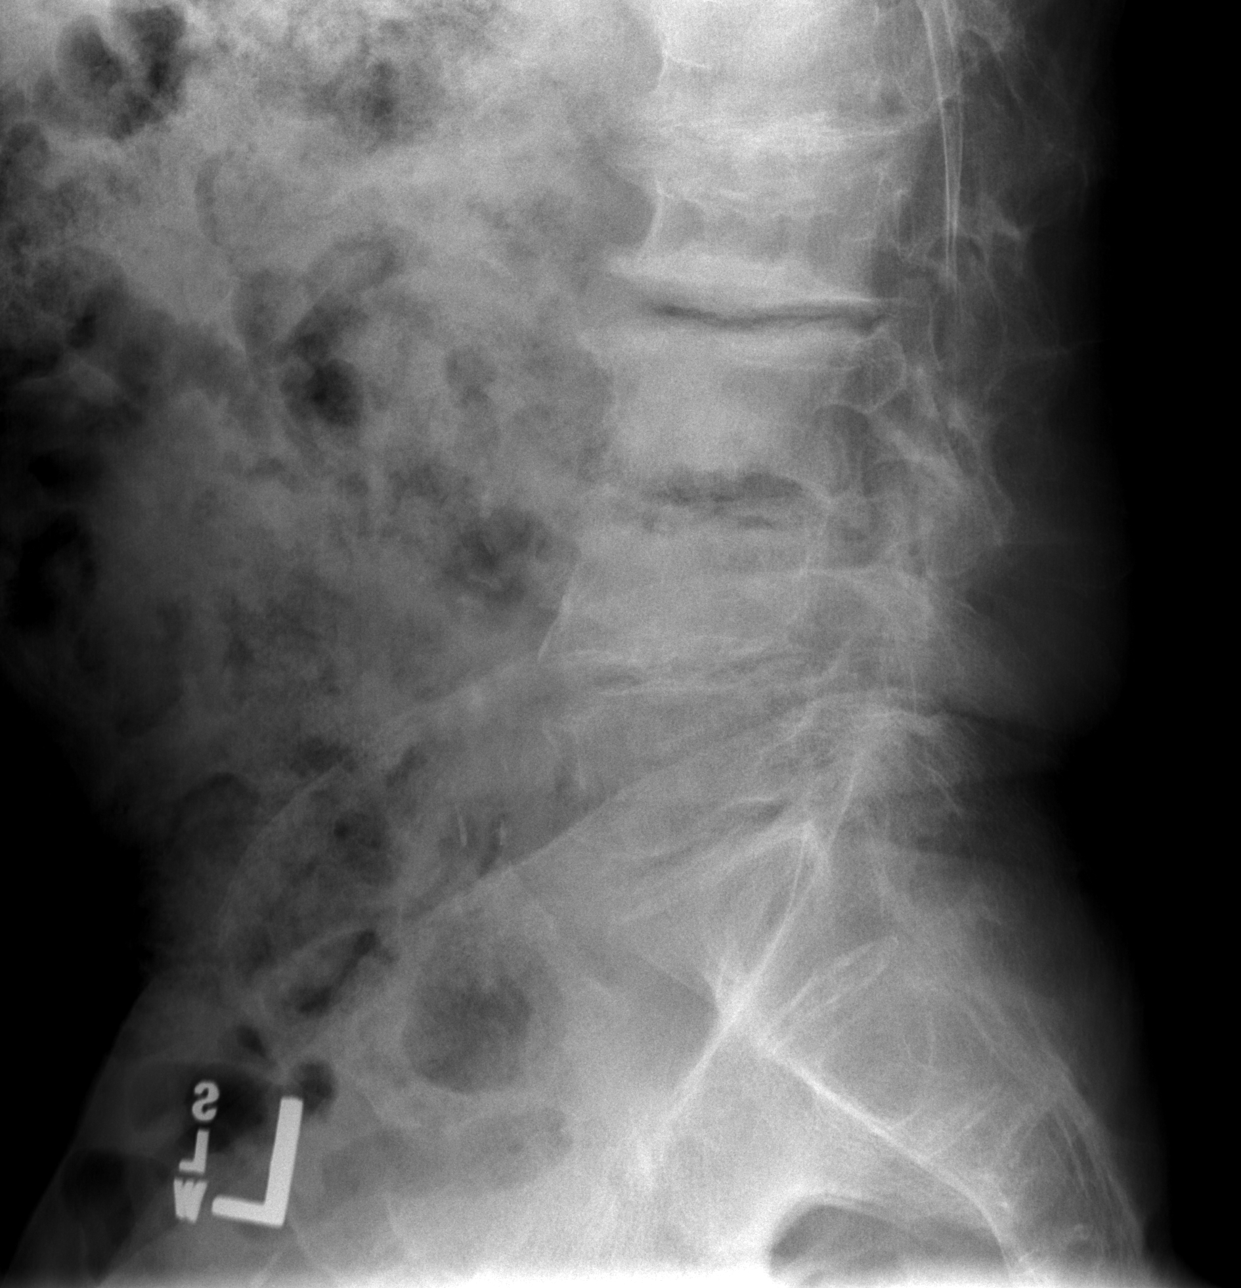

[5 of 5 positions shown; findings below may reference images not displayed]

FINDINGS: Moderate to severe rightward scoliosis in the lumbar spine. Advanced
degenerative disc disease degenerative and facet disease throughout
the lumbar spine. No fracture.
IMPRESSION: Advanced degenerative changes and scoliosis in the lumbar spine. No
acute findings.

## 2016-03-13 MED ORDER — LIDOCAINE 5 % EX PTCH
1.0000 | MEDICATED_PATCH | CUTANEOUS | 0 refills | Status: AC
Start: 2016-03-13 — End: 2016-03-28

## 2016-03-13 MED ORDER — CYCLOBENZAPRINE HCL 10 MG PO TABS: 10.0000 mg | ORAL_TABLET | Freq: Two times a day (BID) | ORAL | 0 refills | Status: DC | PRN

## 2016-03-13 NOTE — Discharge Instructions (Signed)
Please use the muscle relaxant carefully, not mixed with other such meds. Use the patch for 24 hours and REMOVE before placment of another. Follow up with a pcp. We hope you feel better!

## 2016-03-13 NOTE — ED Provider Notes (Addendum)
[facility] Provider Note   CSN: [id number] Arrival date & time: 03/13/16  [id number]  First Provider Contact:  First MD Initiated Contact with Patient 03/13/16 1014        History   Chief Complaint Chief Complaint  Patient presents with  . Back Pain    HPI Riley Eaton is a 75 y.o. male.  HPI   Patient is a 75 year old male presents today with acute on chronic back pain. Patient has had long history of back pain. He had multiple surgeries back in 2003. Patient reports that he takes OxyContin for times daily as well as Percocet 2 times on top of that. In addition to gabapentin 800 mg 4 times a day. Patient states that he thinks he tweaked his back this weekend and has gotten worse. However patient states that he has seen by his neurologist for this pain several weeks ago and referred for both orthopedics and neurosurgery in follow-up.  Past Medical History:  Diagnosis Date  . Chronic back pain   . Coronary artery calcification seen on CT scan   . Nerve pain   . Thoracic aortic aneurysm [facility])     Patient Active Problem List   Diagnosis Date Noted  . Thoracic aortic aneurysm ([place]) 10/03/2015  . Coronary artery calcification seen on CT scan 10/03/2015    Past Surgical History:  Procedure Laterality Date  . back surgey         Home Medications    Prior to Admission medications   Medication Sig Start Date End Date Taking? Authorizing Provider  tiZANidine (ZANAFLEX) 4 MG capsule Take 4 mg by mouth 3 (three) times daily.   Yes Historical Provider, MD  cholecalciferol (VITAMIN D) 1000 units tablet Take 1,000 Units by mouth daily.    Historical Provider, MD  gabapentin (NEURONTIN) 800 MG tablet Take 800 mg by mouth 3 (three) times daily.    Historical Provider, MD  HYDROcodone-acetaminophen (NORCO/VICODIN) 5-325 MG per tablet Take 1-2 tablets by mouth every 6 (six) hours as needed for moderate pain. 02/08/14   Fredia Sorrow, MD  omeprazole (PRILOSEC) 20 MG  capsule Take 20 mg by mouth 2 (two) times daily before a meal.    Historical Provider, MD  OXYCONTIN 40 MG 12 hr tablet Take 40 mg by mouth 3 (three) times daily. 08/03/15   Historical Provider, MD  promethazine (PHENERGAN) 25 MG tablet Take 1 tablet by mouth every 4 (four) hours as needed.  08/02/15   Historical Provider, MD  tamsulosin (FLOMAX) 0.4 MG CAPS capsule Take 0.4 mg by mouth daily. 08/29/15   Historical Provider, MD  traZODone (DESYREL) 50 MG tablet Take 50 mg by mouth at bedtime.    Historical Provider, MD    Family History History reviewed. No pertinent family history.  Social History Social History  Substance Use Topics  . Smoking status: Never Smoker  . Smokeless tobacco: Never Used  . Alcohol use No     Allergies   Review of patient's allergies indicates no known allergies.   Review of Systems Review of Systems  Constitutional: Negative for activity change and fever.  Respiratory: Negative for shortness of breath.   Cardiovascular: Negative for chest pain.  Gastrointestinal: Negative for abdominal pain.  Genitourinary: Negative for difficulty urinating and dysuria.  Musculoskeletal: Positive for back pain.     Physical Exam Updated Vital Signs BP 123/82 (BP Location: Left Arm)   Pulse 66   Temp 97.6 F (36.4 C) (Oral)   Resp 18  Ht 5\' 7"  (1.702 m)   Wt 159 lb (72.1 kg)   SpO2 97%   BMI 24.90 kg/m   Physical Exam  Constitutional: He appears well-developed and well-nourished.  HENT:  Head: Normocephalic and atraumatic.  Eyes: Conjunctivae are normal.  Neck: Neck supple.  Abdominal: Soft. There is no tenderness.  Musculoskeletal: He exhibits no edema.  Straight leg test negative bilaterally. Patient has pain all the way across his L-spine. No point tenderness. Patient able to get up out of bed very quickly with no pain.  Neurological: He is alert.  Skin: Skin is warm and dry.  Psychiatric: He has a normal mood and affect.  Nursing note and  vitals reviewed.    ED Treatments / Results  Labs (all labs ordered are listed, but only abnormal results are displayed) Labs Reviewed  URINE CULTURE  URINALYSIS, ROUTINE W REFLEX MICROSCOPIC (NOT AT [facility])    EKG  EKG Interpretation None       Radiology Dg Lumbar Spine Complete  Result Date: 03/13/2016 CLINICAL DATA:  Chronic low back pain. EXAM: LUMBAR SPINE - COMPLETE 4+ VIEW COMPARISON:  None. 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 No fracture. 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 No acute findings. Electronically Signed   By: Rolm Baptise M.D.   On: 03/13/2016 10:40    Procedures Procedures (including critical care time)  Medications Ordered in ED Medications - No data to display   Initial Impression / Assessment and Plan / ED Course  I have reviewed the triage vital signs and the nursing notes.  Pertinent labs & imaging results that were available during my care of the patient were reviewed by me and considered in my medical decision making (see chart for details).  Clinical Course    Patient is a 75 year old male here with acute on chronic pain. Patient is on an enourmous amount of opiate pain medications at home. Patient is seen by neurology for his chronic back pain and has been referred to orthopedics and neurosurgery about a month ago. Patient says that his pain got a little worse. In talking patient, I stated I dd not feel comfortable prescribing more pain medication. His wife is in agreement and says he "overdosed on pain medication and doesn't want him to do it again". Patient appears to have very little pain on physical exam and is able to get it up and move around very quickly.  However given his age I'm concerned about back pain in this gentleman. He has no fever, no red flag symptoms. I'll get  xray and urine to rule out pathologic fracture or UTI.  I decided to call patient's primary care physician to touch base.  12:16 PM Discussed with Emmie Niemann, paitnet's PCP.  SHe said it is the neurologist that perscrbes all the opiates and she agrees that increasng them in the age of a gentleman is not indicated.  We will have her follow up with him, and discussed referral to physical therapist.   12:16 PM Urine negative. Will give prescription for lidoderm patch and flexeril (warned him not to tak ewith other muscle relaxants if he has them at home.)  Will have him follow up with PCP for physical therapy/chronic pain physician.   Final Clinical Impressions(s) / ED Diagnoses   Final diagnoses:  None    New Prescriptions New Prescriptions   No medications on file  Ulas Zuercher Julio Alm, MD 03/13/16 [place], MD 03/13/16 1216

## 2016-03-13 NOTE — ED Triage Notes (Signed)
Pt reports chronic back pain, with multiple surgeries in 2003. Pt states his pain has increased over the last several months, and his neurologist is unsure why. Pt states he takes oxycodone and gabapentin at home, but it is not helping. Denies any injury or trauma.

## 2016-03-14 LAB — URINE CULTURE: Culture: NO GROWTH

## 2016-10-29 ENCOUNTER — Ambulatory Visit: Payer: Medicare Other | Admitting: Cardiovascular Disease

## 2016-11-18 ENCOUNTER — Encounter (HOSPITAL_BASED_OUTPATIENT_CLINIC_OR_DEPARTMENT_OTHER): Payer: Self-pay | Admitting: *Deleted

## 2016-11-18 ENCOUNTER — Emergency Department (HOSPITAL_BASED_OUTPATIENT_CLINIC_OR_DEPARTMENT_OTHER)
Admission: EM | Admit: 2016-11-18 | Discharge: 2016-11-18 | Disposition: A | Discharge status: Home / Self Care | Payer: Medicare Other | Attending: Emergency Medicine | Admitting: Emergency Medicine

## 2016-11-18 DIAGNOSIS — M545 Low back pain, unspecified: Secondary | ICD-10-CM

## 2016-11-18 DIAGNOSIS — Z79899 Other long term (current) drug therapy: Secondary | ICD-10-CM | POA: Insufficient documentation

## 2016-11-18 DIAGNOSIS — G8929 Other chronic pain: Secondary | ICD-10-CM | POA: Insufficient documentation

## 2016-11-18 MED ORDER — CYCLOBENZAPRINE HCL 10 MG PO TABS: 10.0000 mg | ORAL_TABLET | Freq: Two times a day (BID) | ORAL | 0 refills | Status: DC | PRN

## 2016-11-18 MED ORDER — CYCLOBENZAPRINE HCL 10 MG PO TABS
10.0000 mg | ORAL_TABLET | Freq: Once | ORAL | Status: AC
  Administered 2016-11-18: 10 mg via ORAL
  Filled 2016-11-18: qty 1

## 2016-11-18 NOTE — ED Triage Notes (Signed)
Pain in his lower back. Worse yesterday.

## 2016-11-18 NOTE — ED Notes (Signed)
Patient claimed that he has sharp pain to bilateral flanks.  Surgical incision to lower back is healing well.

## 2016-11-18 NOTE — ED Provider Notes (Signed)
[facility] DEPT [facility] Provider Note   CSN: [id number] Arrival date & time: 11/18/16  1234     History   Chief Complaint Chief Complaint  Patient presents with  . Back Pain    HPI Riley Eaton is a 76 y.o. male.  HPI   Back pain, lower back radiating to the sides, and shoots down front and side of both legs.  Sharp pain.  No abdominal pain. No numbness/weakness. No loss of control of bowel or bladder. No falls or trauma.  Started Friday, worsening over weekend, Sunday was a terrible day regarding pain.  Pain 9/10. Worse with movements.  Tried taking oxycontin, gabapentin and naproxen without relief.  Hx of similar back pain, reason for surgery had it with Dr. Brigitte Pulse.  1 week from Wednesday scheduled to see surgery doctor. Reports similar back pain prior, yesterday was more severe. Has not had prior relief with prednisone. Has had relief with muscle relaxants in the past.  Past Medical History:  Diagnosis Date  . Chronic back pain   . Coronary artery calcification seen on CT scan   . Nerve pain   . Thoracic aortic aneurysm [facility])     Patient Active Problem List   Diagnosis Date Noted  . Thoracic aortic aneurysm ([place]) 10/03/2015  . Coronary artery calcification seen on CT scan 10/03/2015    Past Surgical History:  Procedure Laterality Date  . back surgey         Home Medications    Prior to Admission medications   Medication Sig Start Date End Date Taking? Authorizing Provider  cholecalciferol (VITAMIN D) 1000 units tablet Take 1,000 Units by mouth daily.   Yes Historical Provider, MD  gabapentin (NEURONTIN) 800 MG tablet Take 800 mg by mouth 3 (three) times daily.   Yes Historical Provider, MD  HYDROcodone-acetaminophen (NORCO/VICODIN) 5-325 MG per tablet Take 1-2 tablets by mouth every 6 (six) hours as needed for moderate pain. 02/08/14  Yes Fredia Sorrow, MD  omeprazole (PRILOSEC) 20 MG capsule Take 20 mg by mouth 2 (two) times daily before a meal.   Yes  Historical Provider, MD  OXYCONTIN 40 MG 12 hr tablet Take 40 mg by mouth 3 (three) times daily. 08/03/15  Yes Historical Provider, MD  promethazine (PHENERGAN) 25 MG tablet Take 1 tablet by mouth every 4 (four) hours as needed.  08/02/15  Yes Historical Provider, MD  tamsulosin (FLOMAX) 0.4 MG CAPS capsule Take 0.4 mg by mouth daily. 08/29/15  Yes Historical Provider, MD  traZODone (DESYREL) 50 MG tablet Take 50 mg by mouth at bedtime.   Yes Historical Provider, MD  cyclobenzaprine (FLEXERIL) 10 MG tablet Take 1 tablet (10 mg total) by mouth 2 (two) times daily as needed for muscle spasms. 11/18/16   Gareth Morgan, MD  tiZANidine (ZANAFLEX) 4 MG capsule Take 4 mg by mouth 3 (three) times daily.    Historical Provider, MD    Family History No family history on file.  Social History Social History  Substance Use Topics  . Smoking status: Never Smoker  . Smokeless tobacco: Never Used  . Alcohol use No     Allergies   Patient has no known allergies.   Review of Systems Review of Systems  Constitutional: Negative for fever.  HENT: Negative for sore throat.   Eyes: Negative for visual disturbance.  Respiratory: Negative for shortness of breath.   Cardiovascular: Negative for chest pain.  Gastrointestinal: Negative for abdominal pain, nausea and vomiting.  Genitourinary: Negative for difficulty  urinating and dysuria.  Musculoskeletal: Positive for back pain. Negative for neck stiffness.  Skin: Negative for rash.  Neurological: Negative for syncope, weakness, numbness and headaches.     Physical Exam Updated Vital Signs BP 112/77 (BP Location: Right Arm)   Pulse (!) 54   Temp 97.3 F (36.3 C) (Oral)   Resp 16   Ht 5\' 7"  (1.702 m)   Wt 157 lb (71.2 kg)   SpO2 99%   BMI 24.59 kg/m   Physical Exam  Constitutional: He is oriented to person, place, and time. He appears well-developed and well-nourished. No distress.  HENT:  Head: Normocephalic and atraumatic.  Eyes:  Conjunctivae and EOM are normal.  Neck: Normal range of motion.  Cardiovascular: Normal rate, regular rhythm, normal heart sounds and intact distal pulses.  Exam reveals no gallop and no friction rub.   No murmur heard. Pulmonary/Chest: Effort normal and breath sounds normal. No respiratory distress. He has no wheezes. He has no rales.  Abdominal: Soft. He exhibits no distension. There is no tenderness. There is no guarding.  Musculoskeletal: He exhibits no edema.       Lumbar back: He exhibits tenderness and bony tenderness.  Neurological: He is alert and oriented to person, place, and time.  Skin: Skin is warm and dry. He is not diaphoretic.  Nursing note and vitals reviewed.    ED Treatments / Results  Labs (all labs ordered are listed, but only abnormal results are displayed) Labs Reviewed - No data to display  EKG  EKG Interpretation None       Radiology No results found.  Procedures Procedures (including critical care time)  Medications Ordered in ED Medications  cyclobenzaprine (FLEXERIL) tablet 10 mg (10 mg Oral Given 11/18/16 1540)     Initial Impression / Assessment and Plan / ED Course  I have reviewed the triage vital signs and the nursing notes.  Pertinent labs & imaging results that were available during my care of the patient were reviewed by me and considered in my medical decision making (see chart for details).    76 year old male with a history of chronic low back pain since the 1990s, previous spinal surgeries in 2003, lumbar spine surgery with Dr. Brigitte Pulse 10/03/2016, chronic pain, coronary artery calcification, gastroesophageal reflux disease, thoracic aortic aneurysm who presents with exacerbation of his chronic back pain.  Patient has a normal neurologic exam and denies any urinary retention or overflow incontinence, stool incontinence, saddle anesthesia, fever, trauma, chronic steroid use or immunocompromise and have low suspicion suspicion for cauda  equina, fracture, epidural abscess, or vertebral osteomyelitis.  Patient denies any abdominal pain, urinary symptoms, and given similarity to his prior back pain, and doubt these symptoms represent urinary tract infection, AAA or other intra-abdominal cause.   Given dose of 10 mg Flexeril the emergency department, and a prescription for same. Recommend he follow up with his primary care physician and neurosurgeon.    Final Clinical Impressions(s) / ED Diagnoses   Final diagnoses:  Chronic bilateral low back pain without sciatica, acute on chronic low back pain    New Prescriptions Current Discharge Medication List       Gareth Morgan, MD 11/18/16 1704

## 2016-11-22 ENCOUNTER — Telehealth: Payer: Self-pay | Admitting: Cardiovascular Disease

## 2016-11-22 NOTE — Telephone Encounter (Signed)
Closed encounter °

## 2016-11-26 ENCOUNTER — Ambulatory Visit: Payer: Medicare Other | Admitting: Cardiovascular Disease

## 2016-12-06 ENCOUNTER — Encounter: Payer: Self-pay | Admitting: Cardiovascular Disease

## 2016-12-06 ENCOUNTER — Ambulatory Visit (INDEPENDENT_AMBULATORY_CARE_PROVIDER_SITE_OTHER): Payer: Medicare Other | Admitting: Cardiovascular Disease

## 2016-12-06 VITALS — BP 110/58 | HR 68 | Ht 68.0 in | Wt 152.0 lb

## 2016-12-06 DIAGNOSIS — I2510 Atherosclerotic heart disease of native coronary artery without angina pectoris: Secondary | ICD-10-CM | POA: Diagnosis not present

## 2016-12-06 DIAGNOSIS — I712 Thoracic aortic aneurysm, without rupture, unspecified: Secondary | ICD-10-CM

## 2016-12-06 NOTE — Assessment & Plan Note (Signed)
Mr. Franzel had a Myoview stress test performed in our office 10/20/15 which was entirely normal. He denies chest pain or shortness of breath.

## 2016-12-06 NOTE — Patient Instructions (Signed)
Your physician recommends that you schedule a follow-up appointment as needed with Dr. Berry   

## 2016-12-06 NOTE — Assessment & Plan Note (Signed)
History of small thoracic aortic aneurysm seen on CT scan measured last year 4.1 cm.

## 2016-12-06 NOTE — Progress Notes (Signed)
12/06/2016 Riley Eaton   1941-04-28  [id number]  Primary Physician Bing Matter, PA-C Primary Cardiologist: Lorretta Harp MD Renae Gloss  HPI:  Mr. Holliman is a 76 year old mildly overweight married Caucasian male who is accompanied by his wife [name]. He is the father of 89, grandfather of 4 grandchildren. Retired from being in the [employer]. He is referred by Leonia Reader PA-C at [facility] for cardiovascular evaluation because of an episode of shortness of breath, and an incidentally noted thoracic aortic aneurysm. I last saw him in the office 10/03/15 He basically has no cardiac risk factors. He's never had a heart attack or stroke. He denies chest pain or shortness of breath. A Myoview stress test was performed 10/20/15 which was entirely normal. A CT scan revealed a thoracic aortic aneurysm measuring 4.1 cm.   Current Outpatient Prescriptions  Medication Sig Dispense Refill  . cholecalciferol (VITAMIN D) 1000 units tablet Take 1,000 Units by mouth daily.    . cyclobenzaprine (FLEXERIL) 10 MG tablet Take 1 tablet (10 mg total) by mouth 2 (two) times daily as needed for muscle spasms. 20 tablet 0  . gabapentin (NEURONTIN) 800 MG tablet Take 800 mg by mouth 3 (three) times daily.    [name] HYDROcodone-acetaminophen (NORCO/VICODIN) 5-325 MG per tablet Take 1-2 tablets by mouth every 6 (six) hours as needed for moderate pain. 20 tablet 0  . omeprazole (PRILOSEC) 20 MG capsule Take 20 mg by mouth 2 (two) times daily before a meal.    . OXYCONTIN 40 MG 12 hr tablet Take 40 mg by mouth 3 (three) times daily.  0  . promethazine (PHENERGAN) 25 MG tablet Take 1 tablet by mouth every 4 (four) hours as needed.   6  . tamsulosin (FLOMAX) 0.4 MG CAPS capsule Take 0.4 mg by mouth daily.    [name] tiZANidine (ZANAFLEX) 4 MG capsule Take 4 mg by mouth 3 (three) times daily.    . traZODone (DESYREL) 50 MG tablet Take 50 mg by mouth at bedtime.     No current facility-administered  medications for this visit.     No Known Allergies  Social History   Social History  . Marital status: Married    Spouse name: N/A  . Number of children: N/A  . Years of education: N/A   Occupational History  . Not on file.   Social History Main Topics  . Smoking status: Never Smoker  . Smokeless tobacco: Never Used  . Alcohol use No  . Drug use: No  . Sexual activity: Not on file   Other Topics Concern  . Not on file   Social History Narrative  . No narrative on file     Review of Systems: General: negative for chills, fever, night sweats or weight changes.  Cardiovascular: negative for chest pain, dyspnea on exertion, edema, orthopnea, palpitations, paroxysmal nocturnal dyspnea or shortness of breath Dermatological: negative for rash Respiratory: negative for cough or wheezing Urologic: negative for hematuria Abdominal: negative for nausea, vomiting, diarrhea, bright red blood per rectum, melena, or hematemesis Neurologic: negative for visual changes, syncope, or dizziness All other systems reviewed and are otherwise negative except as noted above.    Blood pressure (!) 84/52, pulse 68, height 5\' 8"  (1.727 m), weight 152 lb (68.9 kg).  General appearance: alert and no distress Neck: no adenopathy, no carotid bruit, no JVD, supple, symmetrical, trachea midline and thyroid not enlarged, symmetric, no tenderness/mass/nodules Lungs: clear to auscultation bilaterally Heart: regular rate  and rhythm, S1, S2 normal, no murmur, click, rub or gallop Extremities: extremities normal, atraumatic, no cyanosis or edema  EKG sinus rhythm at 68 without ST or T-wave changes. I personally reviewed this EKG  ASSESSMENT AND PLAN:   Thoracic aortic aneurysm ([place]) History of small thoracic aortic aneurysm seen on CT scan measured last year 4.1 cm.  Coronary artery calcification seen on CT scan Mr. Cromley had a Myoview stress test performed in our office 10/20/15 which was entirely  normal. He denies chest pain or shortness of breath.      Lorretta Harp MD FACP,FACC,FAHA, [facility] 12/06/2016 4:55 PM

## 2017-05-19 ENCOUNTER — Ambulatory Visit (INDEPENDENT_AMBULATORY_CARE_PROVIDER_SITE_OTHER): Payer: Medicare Other | Admitting: Neurology

## 2017-05-19 ENCOUNTER — Encounter: Payer: Self-pay | Admitting: Neurology

## 2017-05-19 DIAGNOSIS — M545 Low back pain, unspecified: Secondary | ICD-10-CM

## 2017-05-19 DIAGNOSIS — G8929 Other chronic pain: Secondary | ICD-10-CM

## 2017-05-19 DIAGNOSIS — M5442 Lumbago with sciatica, left side: Secondary | ICD-10-CM | POA: Diagnosis not present

## 2017-05-19 DIAGNOSIS — M5441 Lumbago with sciatica, right side: Secondary | ICD-10-CM

## 2017-05-19 HISTORY — DX: Other chronic pain: G89.29

## 2017-05-19 HISTORY — DX: Low back pain, unspecified: M54.50

## 2017-05-19 MED ORDER — DULOXETINE HCL 30 MG PO CPEP: 30.0000 mg | ORAL_CAPSULE | Freq: Two times a day (BID) | ORAL | 3 refills | Status: DC

## 2017-05-19 NOTE — Progress Notes (Addendum)
Reason for visit: Chronic low back pain  Referring physician: Dr. Teena Dunk is a 76 y.o. male  History of present illness:  Riley Eaton is a 76 year old right-handed white male with a history of chronic low back pain dating back over 15 years or more. The patient has in the past seen Dr. Joya Salm and he had undergone a series of 3 back surgeries without complete relief of his back pain. The patient has been followed through a pain center, he had MRI of the lumbar spine done in September 2017, that showed evidence of significant facet joint arthritis and bone edema at the L3-4, with neuroforaminal stenosis at the L4-5 level and the L5-S1 level off to the right. The patient underwent surgery on the 22nd of February 2018 but continued to have back pain following surgery. The patient has ongoing discomfort across the back and into the hips and thighs to the knees and just below the knees. He denies any numbness or weakness of the legs. He denies issues controlling the bowels or the bladder. He has undergone several injections in the back. 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 He is followed through neurology by Dr. Talitha Givens, and his OxyContin dose has been gradually reduced from 40 mg 3 times a day to 40 mg twice a day, with plans to this to 30 mg twice a day. The back pain has increased with this reduction in opioid therapy. The patient is also on gabapentin, he has not been able to tolerate nonsteroidal anti-inflamatory medication secondary to stomach upset. He does not wish to undergo a spinal stimulator for pain. He does not wish to pursue further lumbosacral spine surgery. He does note some neck pain and occasional pain down in the arms on both sides. He denies any significant balance issues, but he walks with a stooped posture, he has a cane for ambulation. The pain worsens when he is up on his feet. He is sent to this office for further evaluation.  Past Medical History:   Diagnosis Date  . Chronic back pain   . Coronary artery calcification seen on CT scan   . Nerve pain   . Thoracic aortic aneurysm [facility])     Past Surgical History:  Procedure Laterality Date  . BACK SURGERY     09-2016  . back surgey      No family history on file.  Social history:  reports that he has never smoked. He has never used smokeless tobacco. He reports that he does not drink alcohol or use drugs.  Medications:  Prior to Admission medications   Medication Sig Start Date End Date Taking? Authorizing Provider  gabapentin (NEURONTIN) 800 MG tablet Take 800 mg by mouth 3 (three) times daily.   Yes [provider]  HYDROcodone-acetaminophen (NORCO/VICODIN) 5-325 MG per tablet Take 1-2 tablets by mouth every 6 (six) hours as needed for moderate pain. 02/08/14  Yes Fredia Sorrow, MD  omeprazole (PRILOSEC) 20 MG capsule Take 20 mg by mouth 2 (two) times daily before a meal.   Yes [provider]  OXYCONTIN 40 MG 12 hr tablet Take 40 mg by mouth 2 (two) times daily.  08/03/15  Yes [provider]  promethazine (PHENERGAN) 25 MG tablet Take 1 tablet by mouth every 4 (four) hours as needed.  08/02/15  Yes [provider]  tamsulosin (FLOMAX) 0.4 MG CAPS capsule Take 0.4 mg by mouth 2 (two) times daily.  08/29/15  Yes [provider]  tiZANidine (ZANAFLEX) 4 MG capsule Take 4 mg by mouth 3 (three) times daily as needed.    Yes [provider]  traZODone (DESYREL) 50 MG tablet Take 50 mg by mouth at bedtime.   Yes [provider]     No Known Allergies  ROS:  Out of a complete 14 system review of symptoms, the patient complains only of the following symptoms, and all other reviewed systems are negative.  Joint pain, achy muscles Impotence Not enough sleep  Blood pressure (!) 145/70, pulse 67, height 5\' 5"  (1.651 m), weight 156 lb 8 oz (71 kg), SpO2 96 %.  Physical Exam  General: The patient is alert and  cooperative at the time of the examination.  Eyes: Pupils are equal, round, and reactive to light. Discs are flat bilaterally.  Neck: The neck is supple, no carotid bruits are noted.  Respiratory: The respiratory examination is clear.  Cardiovascular: The cardiovascular examination reveals a regular rate and rhythm, no obvious murmurs or rubs are noted.  Neuromuscular: The patient is able to fully flex the low back, he is unable to straighten completely, walks with a slightly flexed posture. He has limited rotational movements of the low back.  Skin: Extremities are without significant edema.  Neurologic Exam  Mental status: The patient is alert and oriented x 3 at the time of the examination. The patient has apparent normal recent and remote memory, with an apparently normal attention span and concentration ability.  Cranial nerves: Facial symmetry is present. There is good sensation of the face to pinprick and soft touch bilaterally. The strength of the facial muscles and the muscles to head turning and shoulder shrug are normal bilaterally. Speech is well enunciated, no aphasia or dysarthria is noted. Extraocular movements are full. Visual fields are full. The tongue is midline, and the patient has symmetric elevation of the soft palate. No obvious hearing deficits are noted.  Motor: The motor testing reveals 5 over 5 strength of all 4 extremities. Good symmetric motor tone is noted throughout.  Sensory: Sensory testing is intact to pinprick, soft touch, vibration sensation, and position sense on all 4 extremities, with exception of some decrease in vibration sensation involving the right foot. No evidence of extinction is noted.  Coordination: Cerebellar testing reveals good finger-nose-finger and heel-to-shin bilaterally.  Gait and station: Gait is stooped, slightly unsteady. Tandem gait is unsteady. Romberg is negative.  Reflexes: Deep tendon reflexes are symmetric, but are  depressed bilaterally. Toes are downgoing bilaterally.   MRI lumbar 04/14/16:   IMPRESSION: 1. Advanced multilevel disc and facet degeneration with marked marrow edema at L3-4, favored to be degenerative rather than infectious. 2. Mild spinal stenosis, left greater than right lateral recess stenosis, and severe bilateral foraminal stenosis at L3-4 with potential L3 nerve impingement. 3. 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  Assessment/Plan:  1. Chronic low back pain  2. Gait disorder  The patient has a long-standing history of low back problems. He has limited mobility in his low back, he is stooped with standing. He has been on opiate therapy for about 15 years and his medication levels have been dropped recently with an increase in pain. The patient has not responded to epidural injections of the back. He does not wish to consider a spinal stimulator. The patient will be placed on Cymbalta, gradually working up on the dose. He will go up to 30 mg  twice daily within 2 weeks. He will call for any dose adjustments. I have given him prescription for a lumbar support device. He will follow-up in 4 months.  Jill Alexanders MD 05/19/2017 1:40 PM  [facility] [address] [place], [place] [address]  Phone [phone] Fax [phone]

## 2017-05-19 NOTE — Patient Instructions (Signed)
   We will start Cymbalta for the pain. Start at 30 mg a day for 2 weeks, then take one twice a day.  Cymbalta (duloxetine) is an antidepressant medication that is commonly used for peripheral neuropathy pain or for fibromyalgia pain. As with any antidepressant medication, worsening depression can be seen. This medication can potentially cause headache, dizziness, sexual dysfunction, or nausea. If any problems are noted on this medication, please contact our office.

## 2017-05-20 ENCOUNTER — Telehealth: Payer: Self-pay | Admitting: Neurology

## 2017-05-20 NOTE — Telephone Encounter (Signed)
I called patient. The patient is on low-dose trazodone taking 50 mg at night, be started low-dose Cymbalta, likely okay to use these 2 together in low dose.  The patient is not on any aspirin-like medications, he takes Tylenol.

## 2017-05-20 NOTE — Telephone Encounter (Signed)
Called and spoke with patient. He was reading more about cymbalta and noticed literature stated he should take caution if he is on any antidepressant or if he takes aspirin.  He is wondering if he is okay to still take cymbalta since he take trazodone and tylenol pm.  He would like a call back to let him know. Advised I will send message to Dr Jannifer Franklin.

## 2017-05-20 NOTE — Telephone Encounter (Signed)
Pt called he is wanting to discuss medication but did not want to go into detail. Please call

## 2017-05-22 ENCOUNTER — Telehealth: Payer: Self-pay | Admitting: *Deleted

## 2017-05-22 NOTE — Telephone Encounter (Signed)
Faxed signed order back to [facility] re: lumbar support device. 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 Reason for use: reduce pain by restricting mobility of the trunk area and support weak muscles and/or deformed spine.

## 2017-06-04 ENCOUNTER — Telehealth: Payer: Self-pay | Admitting: Neurology

## 2017-06-04 MED ORDER — DULOXETINE HCL 60 MG PO CPEP: 60.0000 mg | ORAL_CAPSULE | Freq: Two times a day (BID) | ORAL | 3 refills | Status: DC

## 2017-06-04 NOTE — Telephone Encounter (Signed)
Pt has called to inform that he is having constant pain in his back and legs and the medication prescribed for his back and legs have done him no good.  Pt is asking to be seen as soon as possible.  I explained Dec 27th was 1st available and that with that he could be on wait list plus messaging the RN asking if any openings become avail for Dr Jannifer Franklin before that date to please call him and offer.  Since Dr Jannifer Franklin has seen him for the leg and back pain before I offered pt a NP appointment he declined and said he wants to see Dr Tawanna Cooler

## 2017-06-04 NOTE — Telephone Encounter (Signed)
I called the patient. The patient is having ongoing back pain, we will go up on the Cymbalta taking 90 mg daily for one week then convert to 60 mg twice daily.  Ultimately, there will be 3 options for better pain control for this patient.  Firstly, the patient will go up on the opiate dosing that he is currently on. He has been on opiate medications for 15 years.  Secondly, consider a spinal stimulator.  Thirdly, consider a morphine pump for pain control.  The patient wishes for this office to take over his opiate prescriptions. We can do this, he will be delayed his current physician know that we will be writing his prescription. We may go up on the OxyContin taking 40 mg 3 times daily.

## 2017-06-04 NOTE — Addendum Note (Signed)
Addended by: Kathrynn Ducking on: 06/04/2017 05:33 PM   Modules accepted: Orders

## 2017-06-20 ENCOUNTER — Telehealth: Payer: Self-pay

## 2017-06-20 NOTE — Telephone Encounter (Signed)
Patient is calling to discuss his Oxycodone and Oxycontin. He is wondering if his PCP sent records over yet because he does need these refilled.

## 2017-06-20 NOTE — Telephone Encounter (Signed)
I called the patient.  We will take over the prescription for oxycodone and hydrocodone.  He will get oxycodone 40 mg 3 times daily, and get a 60 hydrocodone a month.  The prescription will be due on 25 June 2017.

## 2017-06-25 ENCOUNTER — Other Ambulatory Visit: Payer: Self-pay | Admitting: Neurology

## 2017-06-25 ENCOUNTER — Telehealth: Payer: Self-pay | Admitting: *Deleted

## 2017-06-25 MED ORDER — OXYCONTIN 40 MG PO T12A: 40.0000 mg | EXTENDED_RELEASE_TABLET | Freq: Three times a day (TID) | ORAL | 0 refills | Status: DC

## 2017-06-25 MED ORDER — HYDROCODONE-ACETAMINOPHEN 5-325 MG PO TABS: 1.0000 | ORAL_TABLET | Freq: Four times a day (QID) | ORAL | 0 refills | Status: DC | PRN

## 2017-06-25 NOTE — Telephone Encounter (Signed)
Pt picked up printed rx's hydrocodone and oxycontin. Also signed a pain contract for our office since CW,MD took over prescribing pain meds for pt.  Sent copy to medical records to be scanned into Epic.

## 2017-07-14 ENCOUNTER — Telehealth: Payer: Self-pay | Admitting: Neurology

## 2017-07-14 NOTE — Telephone Encounter (Signed)
I called the patient.  I will be seeing him tomorrow at 7:30 AM.  The patient's had ongoing worsening of discomfort in the legs, he has had several falls when the knees will buckle.  Has a lot of pain from the hip to the knees.  He is on gabapentin, I would be concerned that potentially he could have some asterixis causing the falls.  May consider reducing this dose or even switching to Lyrica.

## 2017-07-14 NOTE — Telephone Encounter (Signed)
Called patient back. Scheduled work in visit with Dr Jannifer Franklin tomorrow at 35am.   Pt states he has worsening leg pain. Not well controlled with pain meds he is on now. He is still having a lot of pain in legs down to knees. Legs are giving out. Has fallen 4-5 times due to legs giving out.

## 2017-07-14 NOTE — Telephone Encounter (Signed)
Pt called he is wanting to be seen sooner than 12/27 but there is nothing available. He is requesting RN call regarding one of his medications. Please call to advise

## 2017-07-15 ENCOUNTER — Ambulatory Visit: Payer: Medicare Other | Admitting: Neurology

## 2017-07-15 ENCOUNTER — Telehealth: Payer: Self-pay | Admitting: Neurology

## 2017-07-15 ENCOUNTER — Ambulatory Visit
Admission: RE | Admit: 2017-07-15 | Discharge: 2017-07-15 | Disposition: A | Discharge status: Home / Self Care | Payer: Medicare Other | Source: Ambulatory Visit | Attending: Neurology | Admitting: Neurology

## 2017-07-15 ENCOUNTER — Encounter: Payer: Self-pay | Admitting: Neurology

## 2017-07-15 VITALS — BP 110/63 | HR 96 | Ht 65.0 in | Wt 157.5 lb

## 2017-07-15 DIAGNOSIS — M25561 Pain in right knee: Principal | ICD-10-CM

## 2017-07-15 DIAGNOSIS — M25562 Pain in left knee: Secondary | ICD-10-CM

## 2017-07-15 DIAGNOSIS — G8929 Other chronic pain: Secondary | ICD-10-CM | POA: Diagnosis not present

## 2017-07-15 IMAGING — CR DG KNEE 1-2V*R*
2 series · 2 of 2 positions shown · non-contrast
Comparison: None available in PACs

CLINICAL DATA: Chronic bilateral knee pain for the past 3 months
with no known trauma. The symptoms are greatest in the peripatellar
regions.

EXAM:
LEFT KNEE - 1-2 VIEW; RIGHT KNEE - 1-2 VIEW

[w knee ap right]
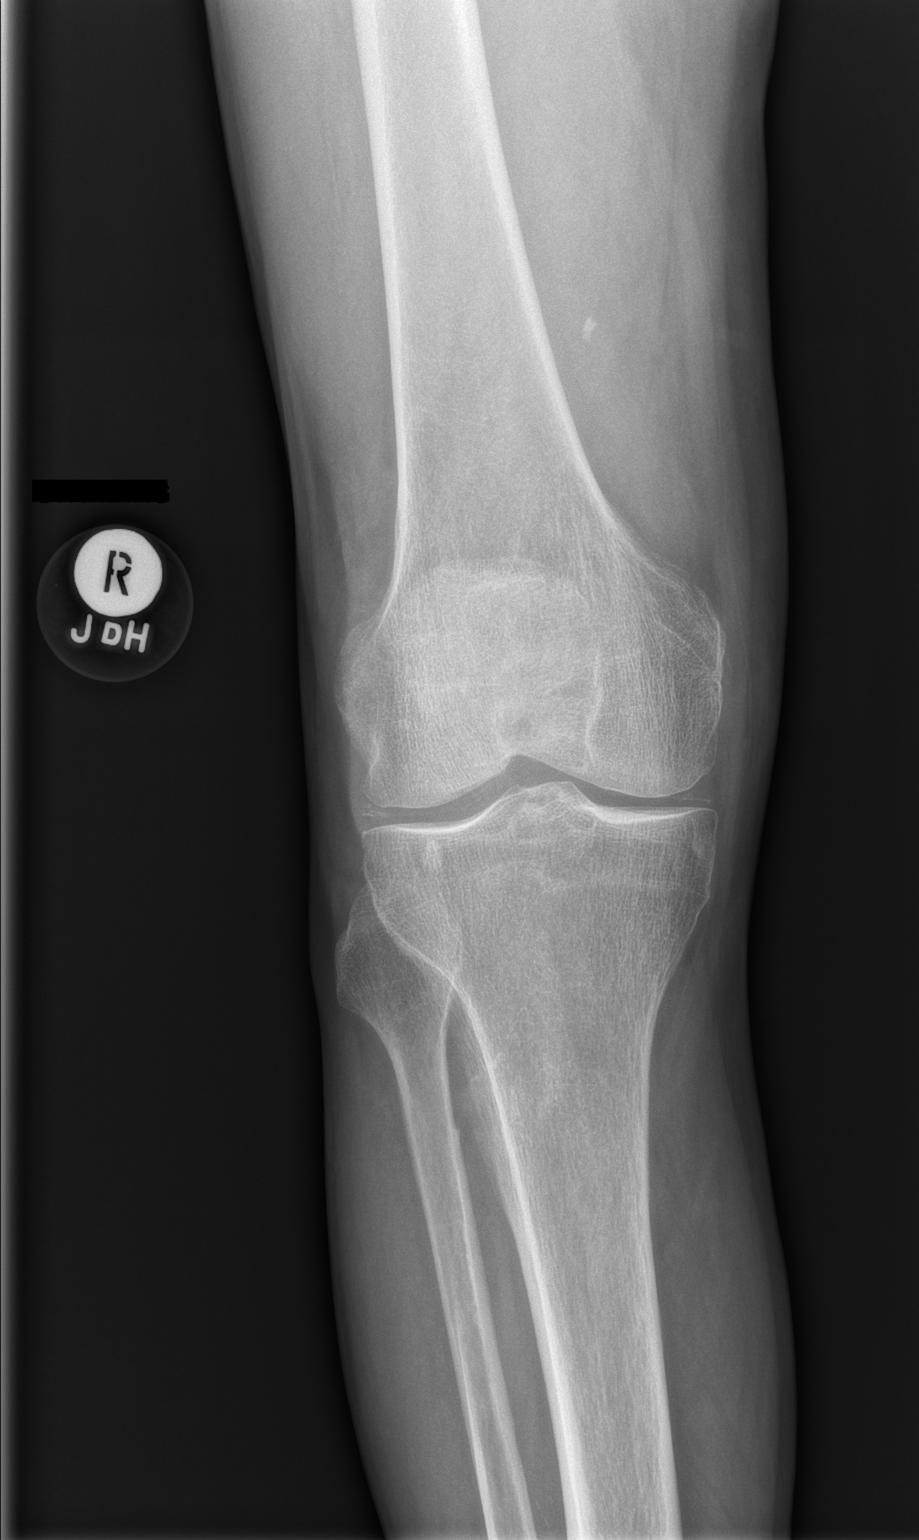

[w knee lat right]
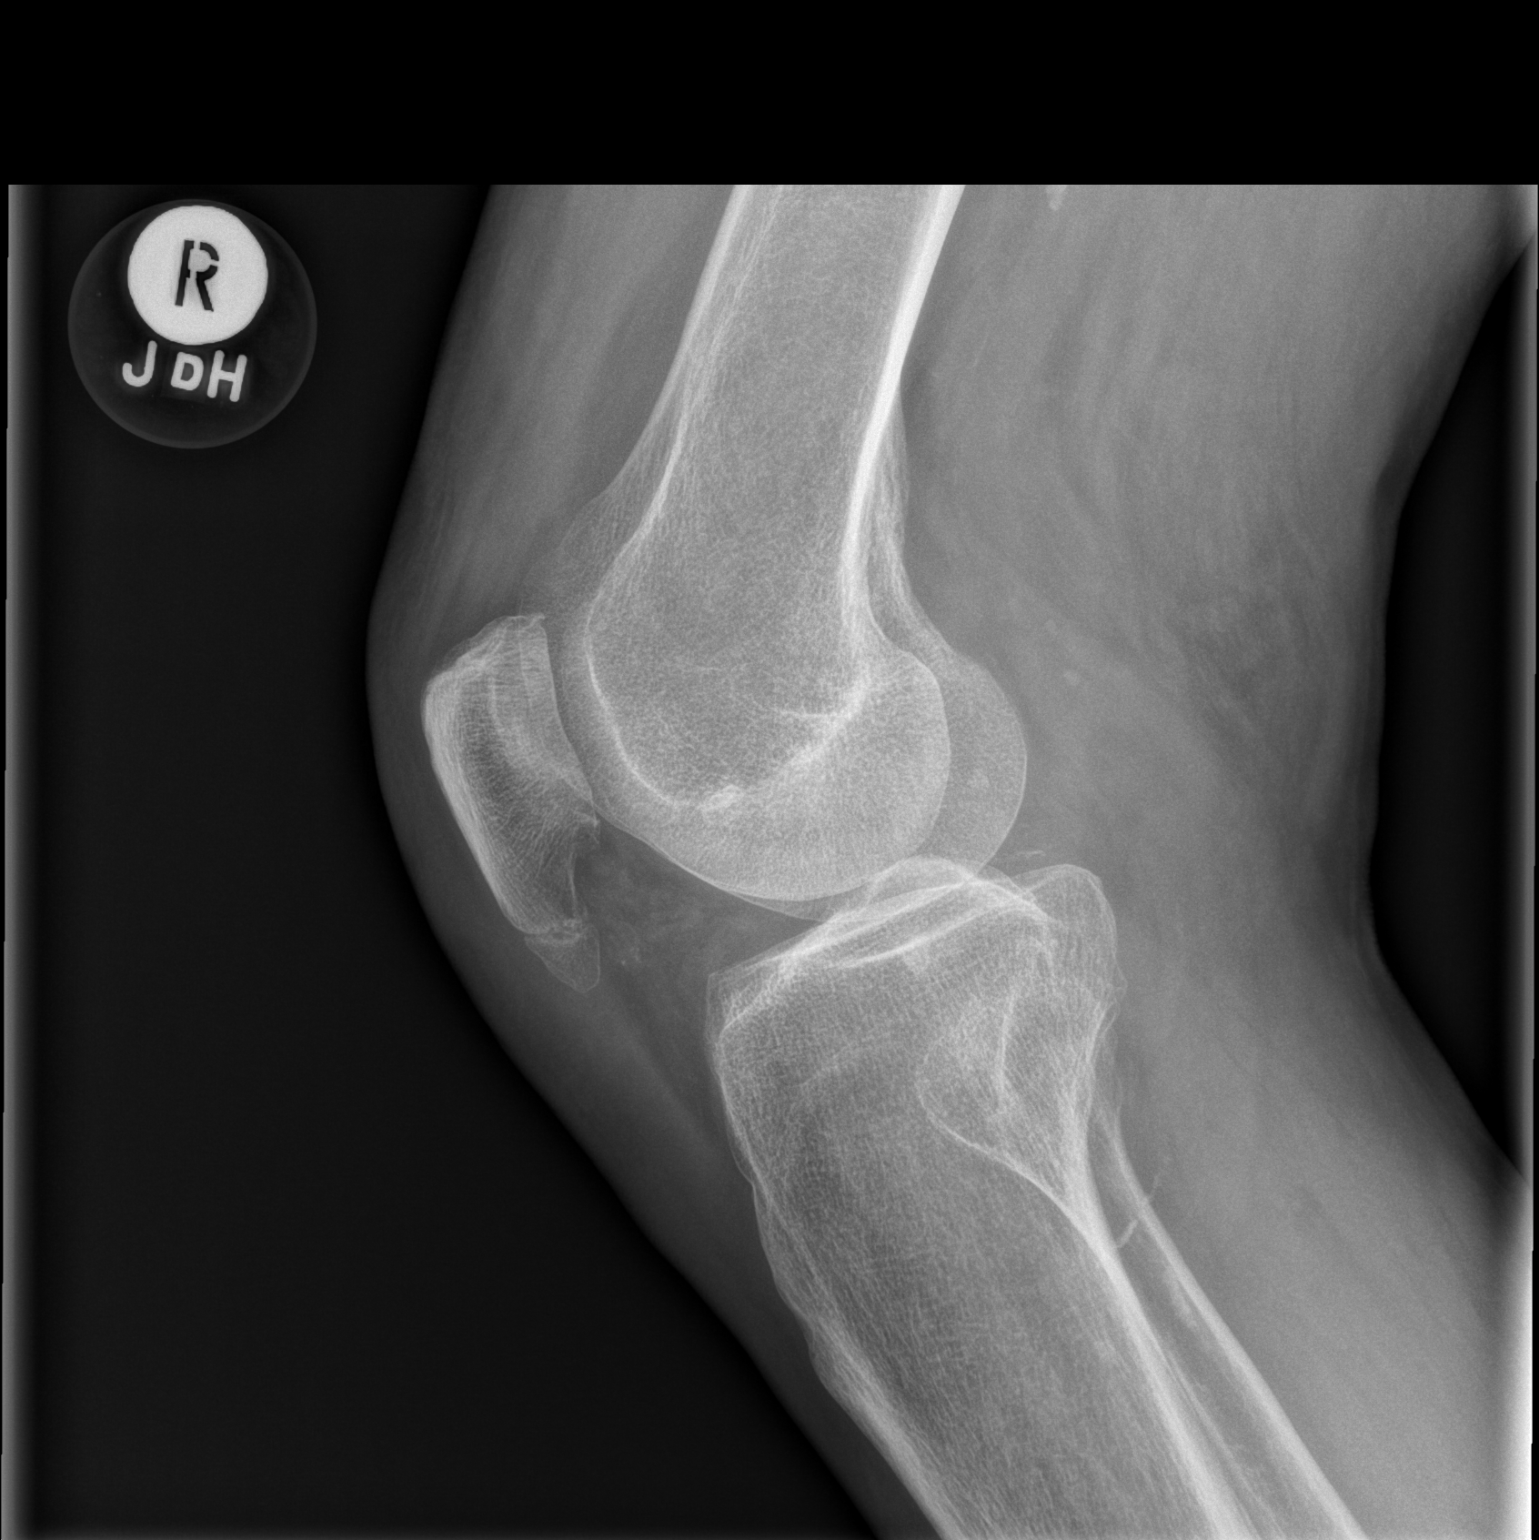

[2 of 2 positions shown; findings below may reference images not displayed]

FINDINGS: Right knee: The bones are subjectively adequately mineralized. There
is calcification of the menisci bilaterally. The joint spaces are
reasonably well-maintained. There are small spurs arising from the
superior articular margin of the patella. There is no joint effusion
or acute fracture. There is an unfused an accessory ossification
center of the inferior pole of the patella. The peripatellar soft
tissues are normal.

Left knee: The bones are subjectively adequately mineralized. There
is faint calcification within the menisci. The joint spaces are well
maintained. There is no joint effusion or acute fracture. The
peripatellar soft tissues are normal.
IMPRESSION: Mild degenerative change of both knees. Chondrocalcinosis of the
menisci bilaterally. No acute or significant chronic abnormality of
the patellae is observed.

## 2017-07-15 IMAGING — CR DG KNEE 1-2V*L*
2 series · 2 of 2 positions shown · non-contrast
Comparison: None available in PACs

CLINICAL DATA: Chronic bilateral knee pain for the past 3 months
with no known trauma. The symptoms are greatest in the peripatellar
regions.

EXAM:
LEFT KNEE - 1-2 VIEW; RIGHT KNEE - 1-2 VIEW

[w knee ap left]
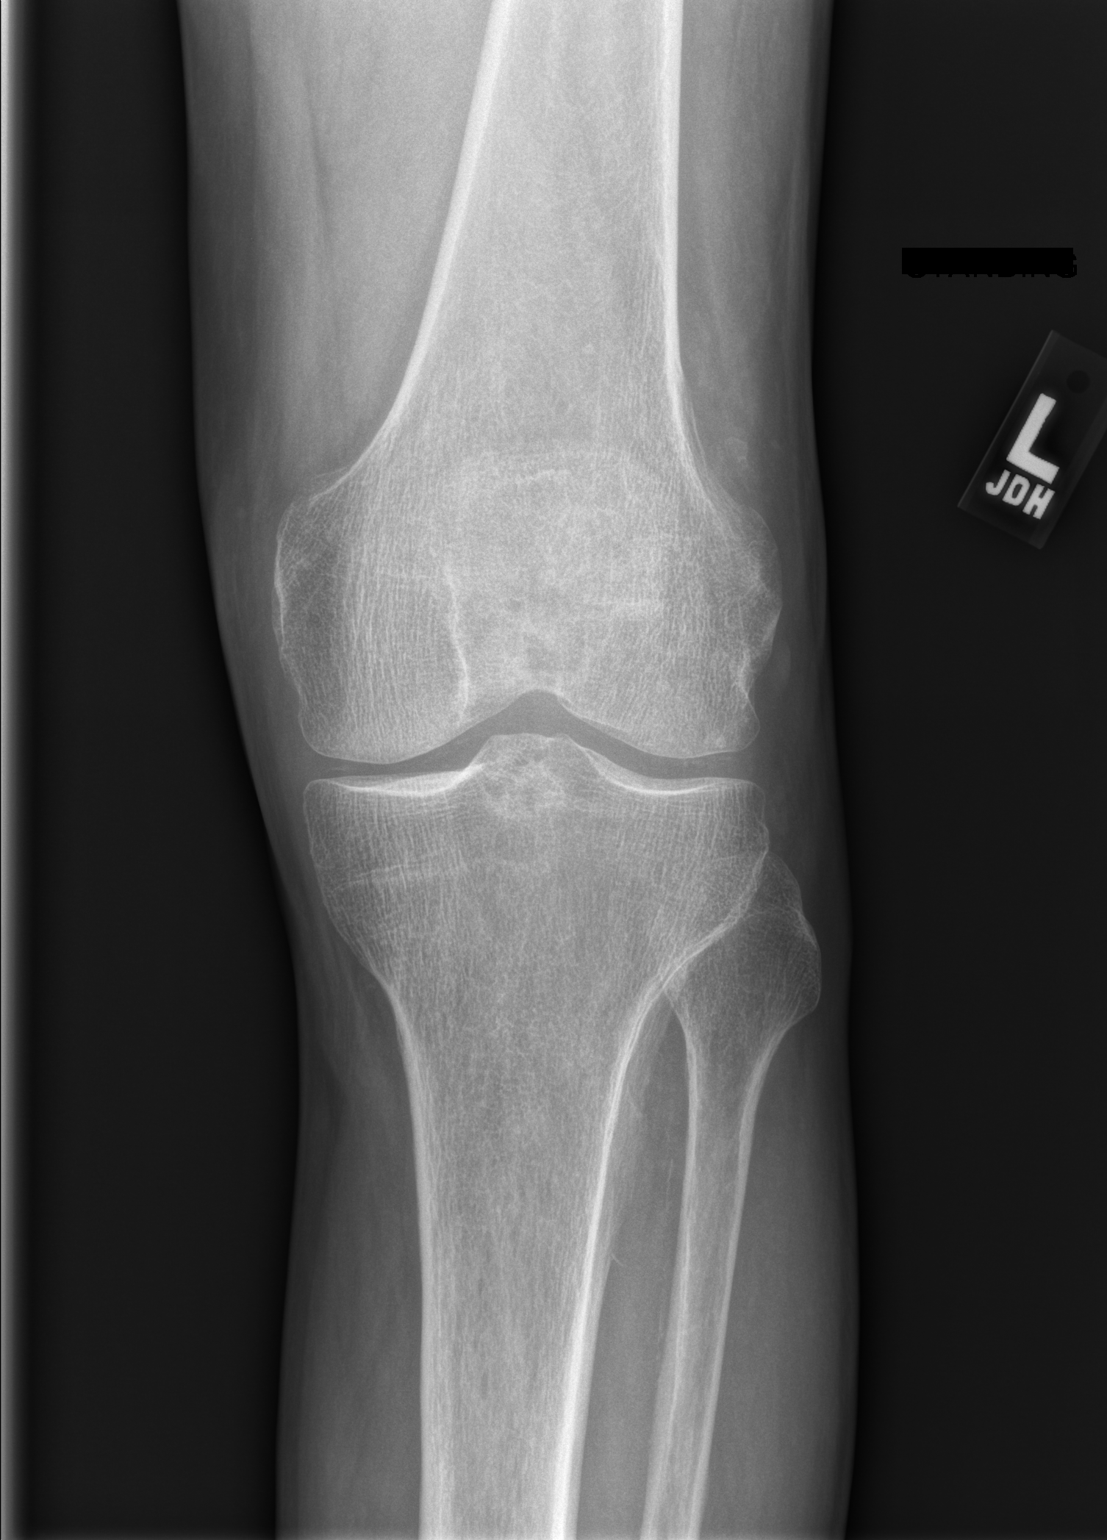

[w knee lat left]
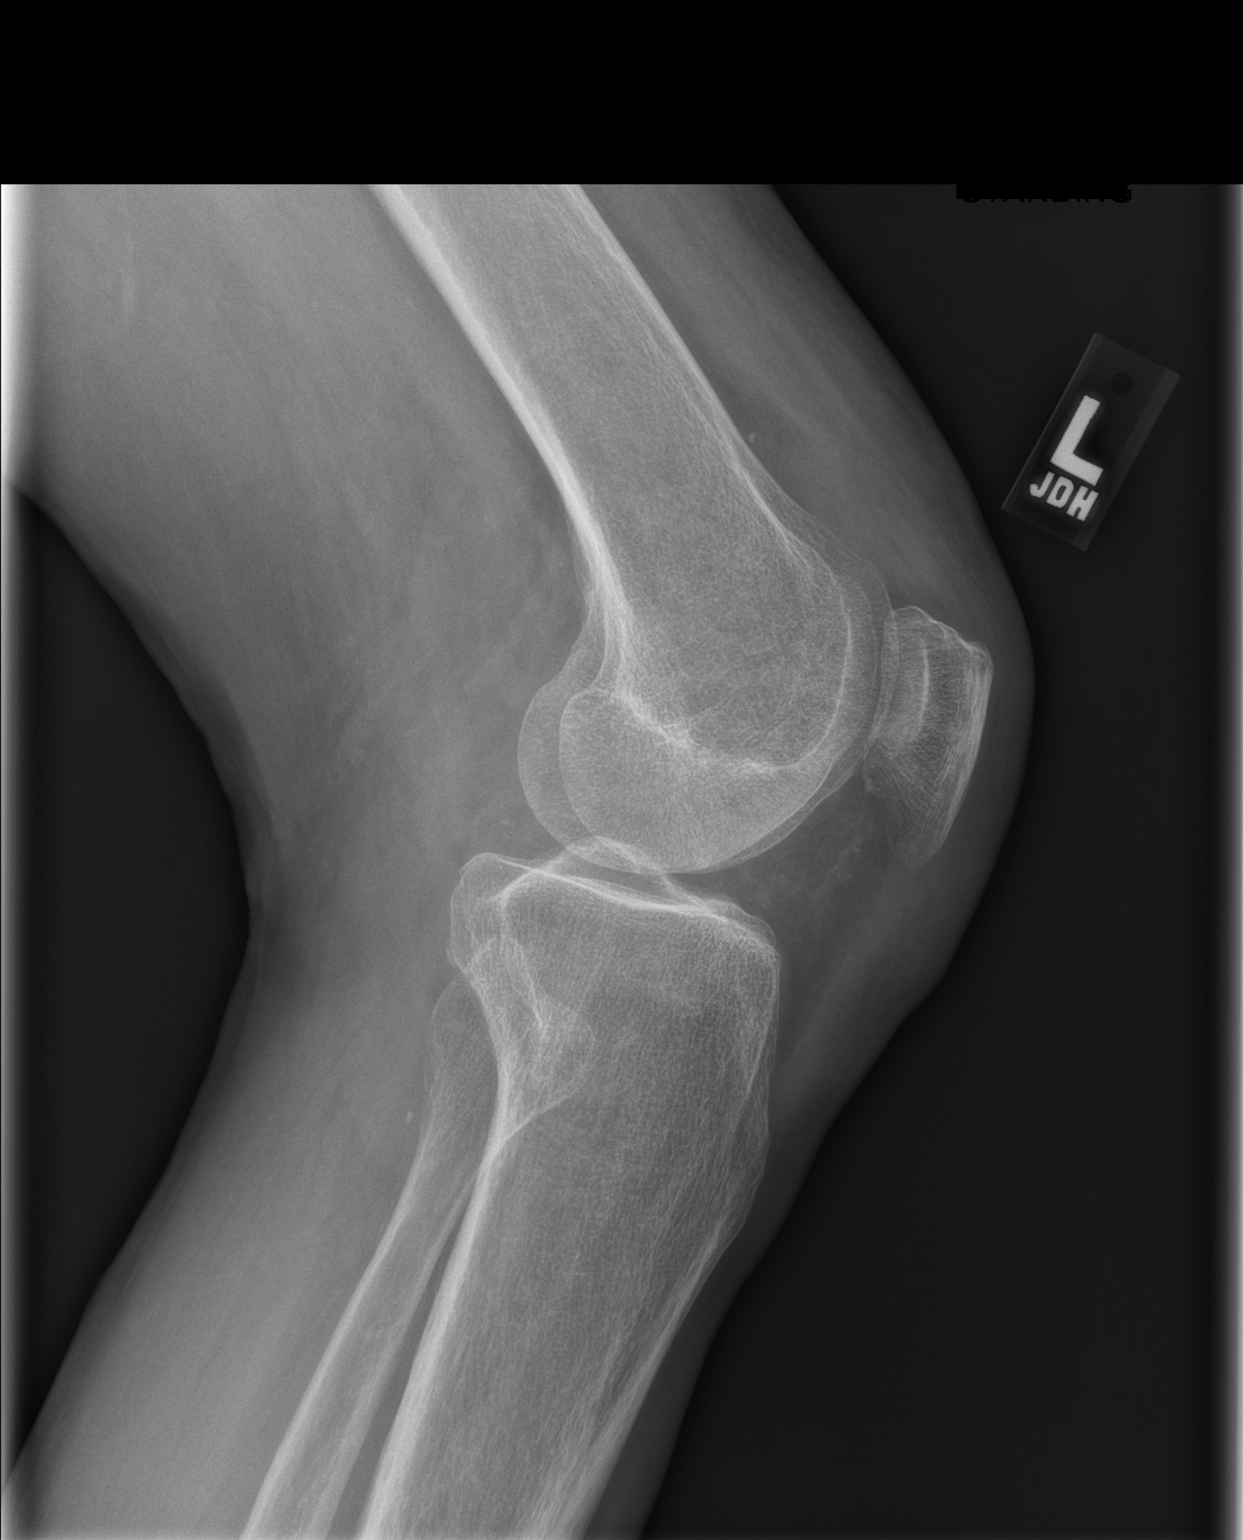

[2 of 2 positions shown; findings below may reference images not displayed]

FINDINGS: Right knee: The bones are subjectively adequately mineralized. There
is calcification of the menisci bilaterally. The joint spaces are
reasonably well-maintained. There are small spurs arising from the
superior articular margin of the patella. There is no joint effusion
or acute fracture. There is an unfused an accessory ossification
center of the inferior pole of the patella. The peripatellar soft
tissues are normal.

Left knee: The bones are subjectively adequately mineralized. There
is faint calcification within the menisci. The joint spaces are well
maintained. There is no joint effusion or acute fracture. The
peripatellar soft tissues are normal.
IMPRESSION: Mild degenerative change of both knees. Chondrocalcinosis of the
menisci bilaterally. No acute or significant chronic abnormality of
the patellae is observed.

## 2017-07-15 MED ORDER — PREGABALIN 150 MG PO CAPS: 150.0000 mg | ORAL_CAPSULE | Freq: Three times a day (TID) | ORAL | 3 refills | Status: DC

## 2017-07-15 MED ORDER — OXYCODONE HCL ER 80 MG PO T12A: 80.0000 mg | EXTENDED_RELEASE_TABLET | Freq: Two times a day (BID) | ORAL | 0 refills | Status: DC

## 2017-07-15 NOTE — Progress Notes (Signed)
Reason for visit: Back pain, leg pain  Riley Eaton is an 76 y.o. male  History of present illness:  Riley Eaton is a 76 year old right-handed white male with a history of multiple lumbosacral spine surgeries and chronic low back pain.  The patient has increased pain in the back, hips, and down the legs to the knees with standing.  He will have episodes of leg collapse with both knees that may cause him to fall.  The last fall was about 1 week ago.  The patient has a cane, but he also may use a walker.  He indicates that the walker does help his back pain some, it may prevent falls when he collapses at the knees.  The patient has not injured himself with any of the falls.  The patient feels much better when he is sitting.  He was recently increased on the OxyContin dose to 40 mg 3 times daily, but this has not helped the pain.  He is on maximum doses of gabapentin.  He has indicated he does not wish to have any further back surgery or consider a spinal stimulator.  The patient returns to this office for an evaluation.  Past Medical History:  Diagnosis Date  . Chronic back pain   . 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 Coronary artery calcification seen on CT scan   . Nerve pain   . Thoracic aortic aneurysm [facility])     Past Surgical History:  Procedure Laterality Date  . BACK SURGERY     09-2016  . back surgey      Family History  Problem Relation Age of Onset  . Cancer Mother   . Heart attack Father     Social history:  reports that  has never smoked. he has never used smokeless tobacco. He reports that he does not drink alcohol or use drugs.   No Known Allergies  Medications:  Prior to Admission medications   Medication Sig Start Date End Date Taking? Authorizing Provider  Diphenhydramine-APAP, sleep, (TYLENOL PM EXTRA STRENGTH PO) Take 1 tablet by mouth daily.   Yes [provider]  DULoxetine (CYMBALTA) 60 MG capsule Take 1 capsule (60 mg total) by mouth 2 (two)  times daily. 06/04/17  Yes Kathrynn Ducking, MD  gabapentin (NEURONTIN) 800 MG tablet Take 800 mg by mouth 4 (four) times daily.    Yes [provider]  HYDROcodone-acetaminophen (NORCO/VICODIN) 5-325 MG tablet Take 1 tablet every 6 (six) hours as needed by mouth for moderate pain. 06/25/17  Yes Kathrynn Ducking, MD  omeprazole (PRILOSEC) 20 MG capsule Take 20 mg by mouth 2 (two) times daily before a meal.   Yes [provider]  OXYCONTIN 40 MG 12 hr tablet Take 1 tablet (40 mg total) 3 (three) times daily by mouth. 06/25/17  Yes Kathrynn Ducking, MD  promethazine (PHENERGAN) 25 MG tablet Take 1 tablet by mouth every 4 (four) hours as needed.  08/02/15  Yes [provider]  tamsulosin (FLOMAX) 0.4 MG CAPS capsule Take 0.4 mg by mouth 2 (two) times daily.  08/29/15  Yes [provider]  tiZANidine (ZANAFLEX) 4 MG capsule Take 4 mg by mouth 3 (three) times daily as needed.    Yes [provider]  traZODone (DESYREL) 50 MG tablet Take 50 mg by mouth at bedtime.   Yes [provider]    ROS:  Out of a complete 14 system review of symptoms, the patient complains only  of the following symptoms, and all other reviewed systems are negative.  Knee pain, back pain Falling  Blood pressure 110/63, pulse 96, height 5\' 5"  (1.651 m), weight 157 lb 8 oz (71.4 kg).  Physical Exam  General: The patient is alert and cooperative at the time of the examination.  Neuromuscular: Rotation of the hips does not elicit hip pain, he does report significant knee discomfort.  Skin: No significant peripheral edema is noted.    Neurologic Exam  Mental status: The patient is alert and oriented x 3 at the time of the examination. The patient has apparent normal recent and remote memory, with an apparently normal attention span and concentration ability.   Cranial nerves: Facial symmetry is present. Speech is normal, no aphasia or dysarthria is noted.  Extraocular movements are full. Visual fields are full.  Motor: The patient has good strength in all 4 extremities.  Sensory examination: Soft touch sensation is symmetric on the face, arms, and legs.  Coordination: The patient has good finger-nose-finger and heel-to-shin bilaterally.  No evidence of asterixis was seen.  Gait and station: The patient has a wide-based gait, he uses a cane for ambulation.  Posture is stooped, he is unable to stand upright.  Romberg is negative.  Tandem gait was not attempted.  Reflexes: Deep tendon reflexes are symmetric.   Assessment/Plan:  1.  Chronic low back pain, leg pain  2.  Multiple falls, leg collapse  The patient is having ongoing significant pain, he is having difficulty walking, and he is falling.  The patient will be increased on the OxyContin taking 80 mg twice daily.  He will be switched from gabapentin to Lyrica, he will stop the gabapentin and go to Lyrica 150 mg 3 times daily.  He will call for any dose adjustments.  The patient was given prescriptions for the OxyContin and for the Lyrica.  X-rays of the knees will be set up.  He will follow-up for his next scheduled visit in February 2019.  Jill Alexanders MD 07/15/2017 7:39 AM  [facility] [address] [place], [place] [address]  Phone [phone] Fax [phone]

## 2017-07-15 NOTE — Patient Instructions (Addendum)
   Stop the gabapentin and start Lyrica 150 mg three times a day.  Call for any dose adjustments.  We will get X-rays of the knees  Lyrica (pregabalin) may cause drowsiness, gait instability, ankle swelling, or cognitive slowing as well as possible dizziness. If any significant side effects occur associated with this medication, please contact our office.  We will go up on the oxycontin dose to 80 mg twice a day.

## 2017-07-15 NOTE — Telephone Encounter (Signed)
I called the patient.  The x-rays of the knees showed mild arthritis, no significant degeneration is seen that would cause severe knee pain or collapse of the knees.   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 No acute or significant chronic abnormality of the patellae is observed.

## 2017-07-23 ENCOUNTER — Telehealth: Payer: Self-pay | Admitting: Neurology

## 2017-07-23 MED ORDER — HYDROCODONE-ACETAMINOPHEN 5-325 MG PO TABS: 1.0000 | ORAL_TABLET | Freq: Four times a day (QID) | ORAL | 0 refills | Status: DC | PRN

## 2017-07-23 NOTE — Telephone Encounter (Signed)
Called pt. He states pharmacy did not approve oxycodone 80mg  every q12 hr. He is wondering if Dr. Jannifer Franklin can write 4 tablets per day of 40mg  tablet.  [facility] [address] [place] [place].  I will call his pharmacy to see what is going on with rx. I will keep him up to date. He verbalized understanding.   Advised rx hydrocodone ready for pick up at our office.

## 2017-07-23 NOTE — Telephone Encounter (Signed)
Pt request refill for oxyCODONE (OXYCONTIN) 80 mg 12 hr tablet. Pt said Dr Jannifer Franklin was going to change the doseage. He also needs refill for HYDROcodone-acetaminophen (NORCO/VICODIN) 5-325 MG tablet . Pt is wanting to pick up RX's on Friday and is aware the clinic closes at noon

## 2017-07-23 NOTE — Telephone Encounter (Signed)
Called and spoke with [name] at [facility]. She stated rx oxycontin now going via insurance okay. Might have been too early to fill previously. I called pt back to let him know ok to pick up from pharmacy. He verbalized understanding and appreciation for call. He will pick up rx hydrocodone tomorrow or Friday before 12pm.  I placed rx up front for pick up.

## 2017-07-23 NOTE — Telephone Encounter (Signed)
The oxycodone dose was just increased on 15 July 2017.  He is due for the hydrocodone prescription.  We were discussing increasing the Lyrica dose, not the oxycodone dose.

## 2017-08-07 ENCOUNTER — Ambulatory Visit: Payer: Medicare Other | Admitting: Neurology

## 2017-08-07 ENCOUNTER — Other Ambulatory Visit: Payer: Self-pay | Admitting: *Deleted

## 2017-08-07 MED ORDER — DULOXETINE HCL 60 MG PO CPEP: 60.0000 mg | ORAL_CAPSULE | Freq: Two times a day (BID) | ORAL | 0 refills | Status: DC

## 2017-08-13 ENCOUNTER — Telehealth: Payer: Self-pay | Admitting: Neurology

## 2017-08-13 MED ORDER — PREGABALIN 200 MG PO CAPS: 200.0000 mg | ORAL_CAPSULE | Freq: Three times a day (TID) | ORAL | 5 refills | Status: DC

## 2017-08-13 NOTE — Addendum Note (Signed)
Addended by: Kathrynn Ducking on: 08/13/2017 06:32 PM   Modules accepted: Orders

## 2017-08-13 NOTE — Telephone Encounter (Signed)
Pt called wanting to speak with RN about these medications didn't want to discuss further pregabalin (LYRICA) 150 MG capsule, DULoxetine (CYMBALTA) 60 MG capsule and oxyCODONE (OXYCONTIN) 80 mg 12 hr tablet

## 2017-08-13 NOTE — Telephone Encounter (Signed)
I called the patient.  The patient has doubled his dose of oxycodone since October 2018 and he is not getting any benefit with his back pain.  I will increase the Lyrica dose to a 200 mg 3 times a day dosing regimen.

## 2017-08-13 NOTE — Telephone Encounter (Signed)
Called and spoke with patient. Increased dose of oxycontin 80 mg twice daily and hydrocodone not helping. Back is hurting when he gets up in the morning. Taking lyrica 150mg  capsule TID and cymbalta 60mg  BID. He feels pain has not improved since he first saw Dr. Jannifer Franklin.  Pt wants to wait until Dr. Jannifer Franklin comes back to the office to address this issue. I offered to speak with Dr. Felecia Shelling, the [facility] but patient states he is more comfortable waiting until Dr. Jannifer Franklin is back in the office to address this. Advised I will send Dr. Jannifer Franklin the message for when he returns. Patient verbalized understanding.

## 2017-08-14 ENCOUNTER — Other Ambulatory Visit: Payer: Self-pay | Admitting: Neurology

## 2017-08-21 MED ORDER — OXYCODONE HCL ER 80 MG PO T12A: 80.0000 mg | EXTENDED_RELEASE_TABLET | Freq: Two times a day (BID) | ORAL | 0 refills | Status: DC

## 2017-08-21 MED ORDER — HYDROCODONE-ACETAMINOPHEN 5-325 MG PO TABS: 1.0000 | ORAL_TABLET | Freq: Four times a day (QID) | ORAL | 0 refills | Status: DC | PRN

## 2017-08-21 NOTE — Addendum Note (Signed)
Addended by: Kathrynn Ducking on: 08/21/2017 10:23 AM   Modules accepted: Orders

## 2017-08-21 NOTE — Telephone Encounter (Signed)
Placed printed/signed rx up front for patient pick up.  

## 2017-08-21 NOTE — Telephone Encounter (Signed)
Pt is requesting a refill for oxyCODONE (OXYCONTIN) 80 mg 12 hr tablet and HYDROcodone-acetaminophen (NORCO/VICODIN) 5-325 MG tablet

## 2017-08-21 NOTE — Telephone Encounter (Signed)
The prescriptions for hydrocodone and OxyContin will be refilled.

## 2017-08-25 ENCOUNTER — Telehealth: Payer: Self-pay | Admitting: Neurology

## 2017-08-25 NOTE — Telephone Encounter (Signed)
Pt called the insurance is not paying for qty on oxyCODONE (OXYCONTIN) 80 mg 12 hr tablet . He said insurance is requiring PA. Please call to advise at [phone]

## 2017-08-25 NOTE — Telephone Encounter (Signed)
error 

## 2017-08-25 NOTE — Telephone Encounter (Signed)
Submitted PA on covermymeds. Waiting on determination from [employer]

## 2017-08-25 NOTE — Telephone Encounter (Signed)
PA in process of being completed on covermymeds. Key: [id number]

## 2017-08-26 NOTE — Telephone Encounter (Addendum)
Called and spoke with wife. Advised PA was approved via insurance. She verbalized understanding and appreciation for call.   Faxed notification of approval from [facility] to [facility] at [phone]. Received fax notification.

## 2017-08-26 NOTE — Telephone Encounter (Signed)
Request Reference Number: [id number]. OXYCONTIN TAB 80MG  CR is approved through 08/11/2018. For further questions, call [phone].

## 2017-09-22 ENCOUNTER — Telehealth: Payer: Self-pay | Admitting: Neurology

## 2017-09-22 MED ORDER — HYDROCODONE-ACETAMINOPHEN 5-325 MG PO TABS: 1.0000 | ORAL_TABLET | Freq: Four times a day (QID) | ORAL | 0 refills | Status: DC | PRN

## 2017-09-22 MED ORDER — OXYCODONE HCL ER 80 MG PO T12A: 80.0000 mg | EXTENDED_RELEASE_TABLET | Freq: Two times a day (BID) | ORAL | 0 refills | Status: DC

## 2017-09-22 NOTE — Telephone Encounter (Signed)
The prescription for oxycodone and hydrocodone will be refilled.  The [facility] registry was checked.

## 2017-09-22 NOTE — Addendum Note (Signed)
Addended by: Kathrynn Ducking on: 09/22/2017 04:50 PM   Modules accepted: Orders

## 2017-09-22 NOTE — Telephone Encounter (Signed)
Patient requesting refill of oxyCODONE (OXYCONTIN) 80 mg 12 hr tablet and HYDROcodone-acetaminophen (NORCO/VICODIN) 5-325 MG tablet.

## 2017-09-23 NOTE — Telephone Encounter (Signed)
Placed printed/signed rx's up front for patient pick up.

## 2017-10-03 ENCOUNTER — Encounter: Payer: Self-pay | Admitting: Neurology

## 2017-10-03 ENCOUNTER — Ambulatory Visit: Payer: Medicare Other | Admitting: Neurology

## 2017-10-03 VITALS — BP 90/55 | HR 80 | Ht 67.0 in | Wt 162.0 lb

## 2017-10-03 DIAGNOSIS — M5441 Lumbago with sciatica, right side: Secondary | ICD-10-CM

## 2017-10-03 DIAGNOSIS — M5442 Lumbago with sciatica, left side: Secondary | ICD-10-CM | POA: Diagnosis not present

## 2017-10-03 DIAGNOSIS — G8929 Other chronic pain: Secondary | ICD-10-CM | POA: Diagnosis not present

## 2017-10-03 NOTE — Progress Notes (Signed)
Reason for visit: Chronic low back pain  Riley Eaton is an 77 y.o. male  History of present illness:  Riley Eaton is a 77 year old right-handed white male with a history of chronic low back pain with pain down the left leg.  The patient claims that he is much more comfortable when he is lying down flat, he sleeps fairly well at night.  He is on high-dose Lyrica taking 200 mg 3 times daily, he is found that this does result in some daytime sleepiness.  The patient has not had any falls, he uses a cane or walker for ambulation.  He has a back brace but this is not comfortable for him to wear.  In the past, epidural injections, facet joint injections have not been beneficial.  He remains on chronic daily opiate drugs.  The patient has increased pain towards the end of the day.  He is never pain-free.  He returns for further evaluation.  The patient is reporting a lot of constipation on the medications.  Past Medical History:  Diagnosis Date  . Chronic back pain   . 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 Coronary artery calcification seen on CT scan   . Nerve pain   . Thoracic aortic aneurysm [facility])     Past Surgical History:  Procedure Laterality Date  . BACK SURGERY     09-2016  . back surgey      Family History  Problem Relation Age of Onset  . Cancer Mother   . Heart attack Father     Social history:  reports that  has never smoked. he has never used smokeless tobacco. He reports that he does not drink alcohol or use drugs.   No Known Allergies  Medications:  Prior to Admission medications   Medication Sig Start Date End Date Taking? Authorizing Provider  Diphenhydramine-APAP, sleep, (TYLENOL PM EXTRA STRENGTH PO) Take 1 tablet by mouth daily.   Yes [provider]  DULoxetine (CYMBALTA) 60 MG capsule Take 1 capsule (60 mg total) by mouth 2 (two) times daily. 08/07/17  Yes Kathrynn Ducking, MD  HYDROcodone-acetaminophen (NORCO/VICODIN) 5-325 MG tablet Take 1  tablet by mouth every 6 (six) hours as needed for moderate pain. Must last 28 days. 09/22/17  Yes Kathrynn Ducking, MD  omeprazole (PRILOSEC) 20 MG capsule Take 20 mg by mouth 2 (two) times daily before a meal.   Yes [provider]  oxyCODONE (OXYCONTIN) 80 mg 12 hr tablet Take 1 tablet (80 mg total) by mouth every 12 (twelve) hours. 09/22/17  Yes Kathrynn Ducking, MD  pregabalin (LYRICA) 200 MG capsule Take 1 capsule (200 mg total) by mouth 3 (three) times daily. 08/13/17  Yes Kathrynn Ducking, MD  promethazine (PHENERGAN) 25 MG tablet Take 1 tablet by mouth every 4 (four) hours as needed.  08/02/15  Yes [provider]  tamsulosin (FLOMAX) 0.4 MG CAPS capsule Take 0.4 mg by mouth 2 (two) times daily.  08/29/15  Yes [provider]  tiZANidine (ZANAFLEX) 4 MG capsule Take 4 mg by mouth 3 (three) times daily as needed.    Yes [provider]  traZODone (DESYREL) 50 MG tablet Take 50 mg by mouth at bedtime.   Yes [provider]    ROS:  Out of a complete 14 system review of symptoms, the patient complains only of the following symptoms, and all other reviewed systems are negative.  Hearing loss, ringing in the ears Constipation incontinence  of the bowels Daytime sleepiness, snoring Joint pain, back pain, aching muscles, walking difficulty Itching Numbness Anxiety  Blood pressure (!) 90/55, pulse 80, height 5\' 7"  (1.702 m), weight 162 lb (73.5 kg).  Physical Exam  General: The patient is alert and cooperative at the time of the examination.  Skin: No significant peripheral edema is noted.   Neurologic Exam  Mental status: The patient is alert and oriented x 3 at the time of the examination. The patient has apparent normal recent and remote memory, with an apparently normal attention span and concentration ability.   Cranial nerves: Facial symmetry is present. Speech is normal, no aphasia or dysarthria is noted. Extraocular movements are  full. Visual fields are full.  Motor: The patient has good strength in all 4 extremities.  Sensory examination: Soft touch sensation is symmetric on the face, arms, and legs.  Coordination: The patient has good finger-nose-finger and heel-to-shin bilaterally.  Gait and station: The patient has a slightly wide-based gait, stooped posture with walking.  Tandem gait was not attempted.  Romberg is negative. No drift is seen.  Reflexes: Deep tendon reflexes are symmetric.   Assessment/Plan:  1.  Chronic low back pain, left leg pain  2.  Chronic constipation  The patient continues to have ongoing discomfort.  He remains on chronic opioid therapy, he is getting Lyrica and Cymbalta.  The patient signed a controlled substance form today.  The will follow-up in 3 months.  We will check a urine drug screen on that visit.  He will start MiraLAX on a daily basis for the constipation.  Jill Alexanders MD 10/03/2017 12:50 PM  [facility] [address] [place], [place] [address]  Phone [phone] Fax [phone]

## 2017-10-13 ENCOUNTER — Other Ambulatory Visit: Payer: Self-pay | Admitting: Neurology

## 2017-10-13 ENCOUNTER — Telehealth: Payer: Self-pay | Admitting: Neurology

## 2017-10-13 NOTE — Telephone Encounter (Signed)
[facility] [place] [facility] DUL[facility]etine (CYMBALTA) 60 MG capsule has no refills  Patient has no more pills.

## 2017-10-13 NOTE — Telephone Encounter (Signed)
Noted  

## 2017-10-13 NOTE — Telephone Encounter (Signed)
Patient called back stating he found his bottle of  DULoxetine (CYMBALTA) 60 MG capsule and does not need a refill. Please disregard previous message,

## 2017-10-22 ENCOUNTER — Telehealth: Payer: Self-pay | Admitting: Neurology

## 2017-10-22 MED ORDER — HYDROCODONE-ACETAMINOPHEN 5-325 MG PO TABS: 1.0000 | ORAL_TABLET | Freq: Four times a day (QID) | ORAL | 0 refills | Status: DC | PRN

## 2017-10-22 MED ORDER — OXYCODONE HCL ER 80 MG PO T12A: 80.0000 mg | EXTENDED_RELEASE_TABLET | Freq: Two times a day (BID) | ORAL | 0 refills | Status: DC

## 2017-10-22 NOTE — Telephone Encounter (Signed)
Prescriptions for the hydrocodone and oxycodone will be refilled.

## 2017-10-22 NOTE — Telephone Encounter (Signed)
Pt called requesting a refill for HYDROcodone-acetaminophen (NORCO/VICODIN) 5-325 MG tablet and oxyCODONE (OXYCONTIN) 80 mg 12 hr tablet. Advised pt that Dr. Jannifer Franklin is able to send these prescriptions [facility] [place]

## 2017-10-23 ENCOUNTER — Telehealth: Payer: Self-pay | Admitting: Neurology

## 2017-10-23 NOTE — Telephone Encounter (Signed)
error 

## 2017-10-23 NOTE — Telephone Encounter (Signed)
[facility] [phone] needs ICD10 code to refill scripts. Please call

## 2017-10-23 NOTE — Telephone Encounter (Signed)
Called [facility]. Provided the following ICD-10 codes: M25.561, M25.562, G89.29, M54.42, M54.41. She verbalized understanding. Nothing further needed.

## 2017-10-26 ENCOUNTER — Emergency Department (HOSPITAL_COMMUNITY)
Admission: EM | Admit: 2017-10-26 | Discharge: 2017-10-26 | Disposition: A | Discharge status: Home / Self Care | Payer: Medicare Other | Attending: Emergency Medicine | Admitting: Emergency Medicine

## 2017-10-26 ENCOUNTER — Other Ambulatory Visit: Payer: Self-pay

## 2017-10-26 ENCOUNTER — Encounter (HOSPITAL_COMMUNITY): Payer: Self-pay

## 2017-10-26 ENCOUNTER — Emergency Department (HOSPITAL_COMMUNITY): Payer: Medicare Other

## 2017-10-26 DIAGNOSIS — K5900 Constipation, unspecified: Secondary | ICD-10-CM | POA: Insufficient documentation

## 2017-10-26 DIAGNOSIS — J189 Pneumonia, unspecified organism: Secondary | ICD-10-CM

## 2017-10-26 DIAGNOSIS — Z79899 Other long term (current) drug therapy: Secondary | ICD-10-CM | POA: Diagnosis not present

## 2017-10-26 DIAGNOSIS — J181 Lobar pneumonia, unspecified organism: Secondary | ICD-10-CM | POA: Insufficient documentation

## 2017-10-26 LAB — COMPREHENSIVE METABOLIC PANEL
ALT: 28 U/L (ref 17–63)
AST: 28 U/L (ref 15–41)
Albumin: 3.9 g/dL (ref 3.5–5.0)
Alkaline Phosphatase: 144 U/L — ABNORMAL HIGH (ref 38–126)
Anion gap: 9 (ref 5–15)
BUN: 18 mg/dL (ref 6–20)
CO2: 26 mmol/L (ref 22–32)
Calcium: 9.4 mg/dL (ref 8.9–10.3)
Chloride: 101 mmol/L (ref 101–111)
Creatinine, Ser: 0.65 mg/dL (ref 0.61–1.24)
GFR calc Af Amer: >60 mL/min (ref >60)
GFR calc non Af Amer: >60 mL/min (ref >60)
Glucose, Bld: 121 mg/dL — ABNORMAL HIGH (ref 65–99)
Potassium: 3.9 mmol/L (ref 3.5–5.1)
Sodium: 136 mmol/L (ref 135–145)
Total Bilirubin: 1.2 mg/dL (ref 0.3–1.2)
Total Protein: 7.3 g/dL (ref 6.5–8.1)

## 2017-10-26 LAB — URINALYSIS, ROUTINE W REFLEX MICROSCOPIC
Bilirubin Urine: NEGATIVE
Glucose, UA: NEGATIVE mg/dL
Hgb urine dipstick: NEGATIVE
Ketones, ur: 5 mg/dL — AB
Leukocytes, UA: NEGATIVE
Nitrite: NEGATIVE
Protein, ur: NEGATIVE mg/dL
Specific Gravity, Urine: 1.011 (ref 1.005–1.030)
Squamous Epithelial / LPF: NONE SEEN
pH: 7 (ref 5.0–8.0)

## 2017-10-26 LAB — CBC
HCT: 38.6 % — ABNORMAL LOW (ref 39.0–52.0)
Hemoglobin: 13.4 g/dL (ref 13.0–17.0)
MCH: 31.8 pg (ref 26.0–34.0)
MCHC: 34.7 g/dL (ref 30.0–36.0)
MCV: 91.7 fL (ref 78.0–100.0)
Platelets: 152 10*3/uL (ref 150–400)
RBC: 4.21 MIL/uL — ABNORMAL LOW (ref 4.22–5.81)
RDW: 13 % (ref 11.5–15.5)
WBC: 9.2 10*3/uL (ref 4.0–10.5)

## 2017-10-26 LAB — LIPASE, BLOOD: Lipase: 19 U/L (ref 11–51)

## 2017-10-26 IMAGING — CR DG ABDOMEN ACUTE W/ 1V CHEST
3 series · 3 of 3 positions shown · non-contrast
Comparison: [DATE]

CLINICAL DATA: Constipation.  Vomiting

EXAM:
DG ABDOMEN ACUTE W/ 1V CHEST

[w abdomen upright]
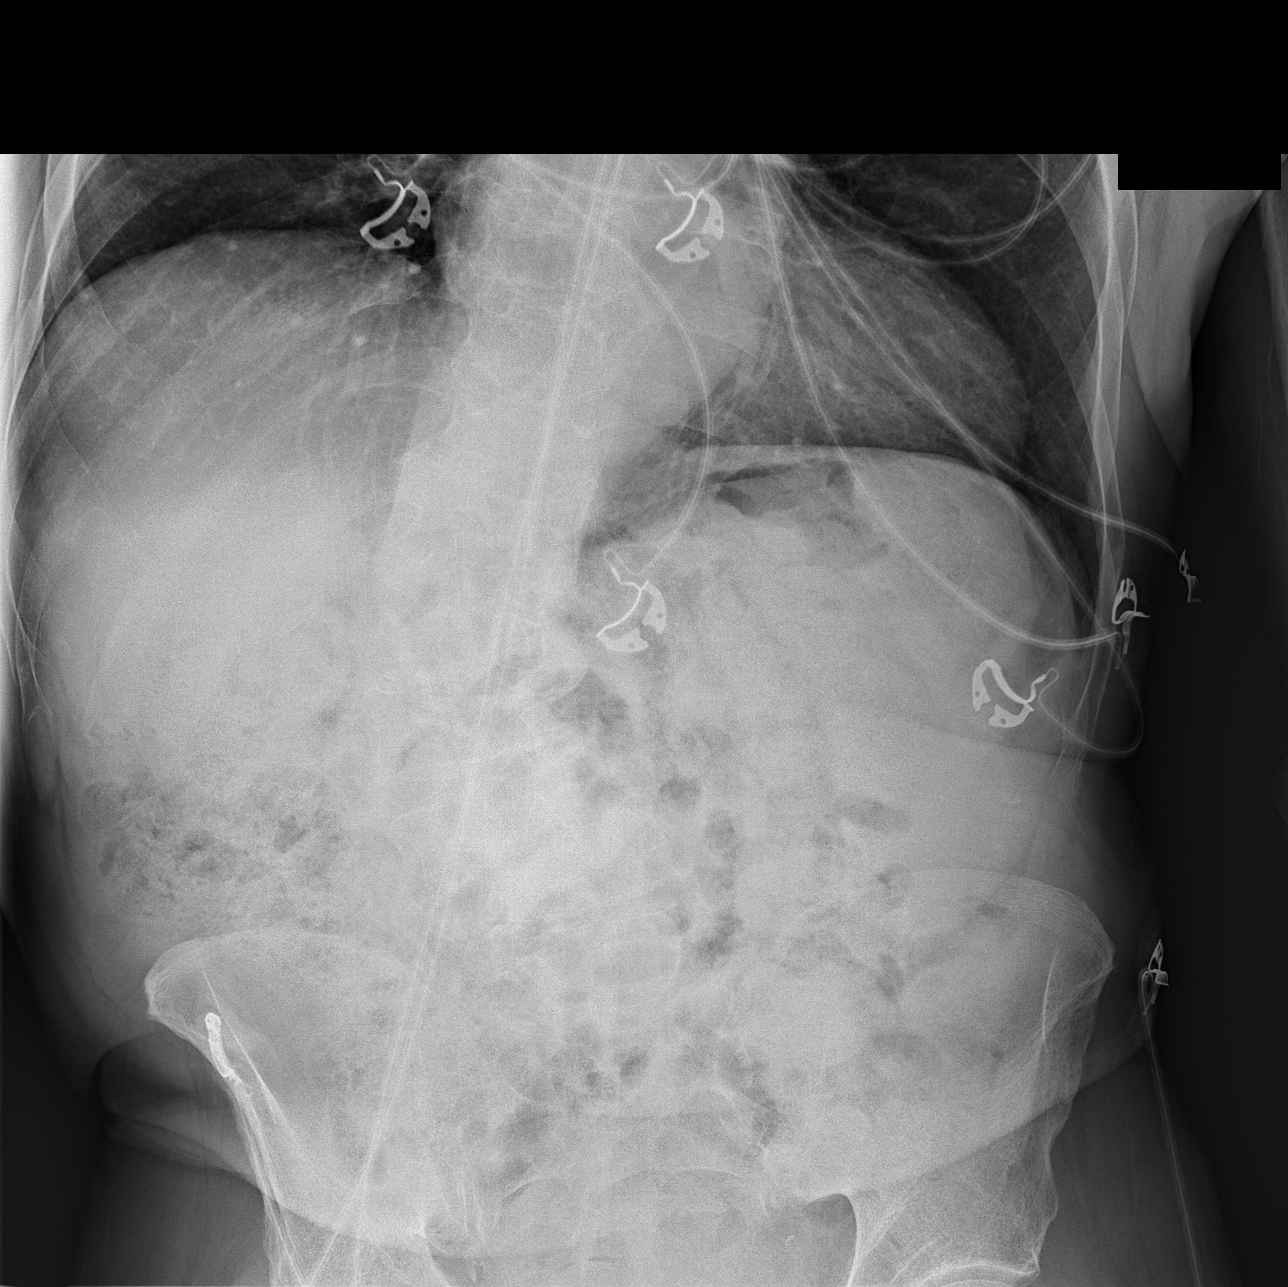

[w chest pa]
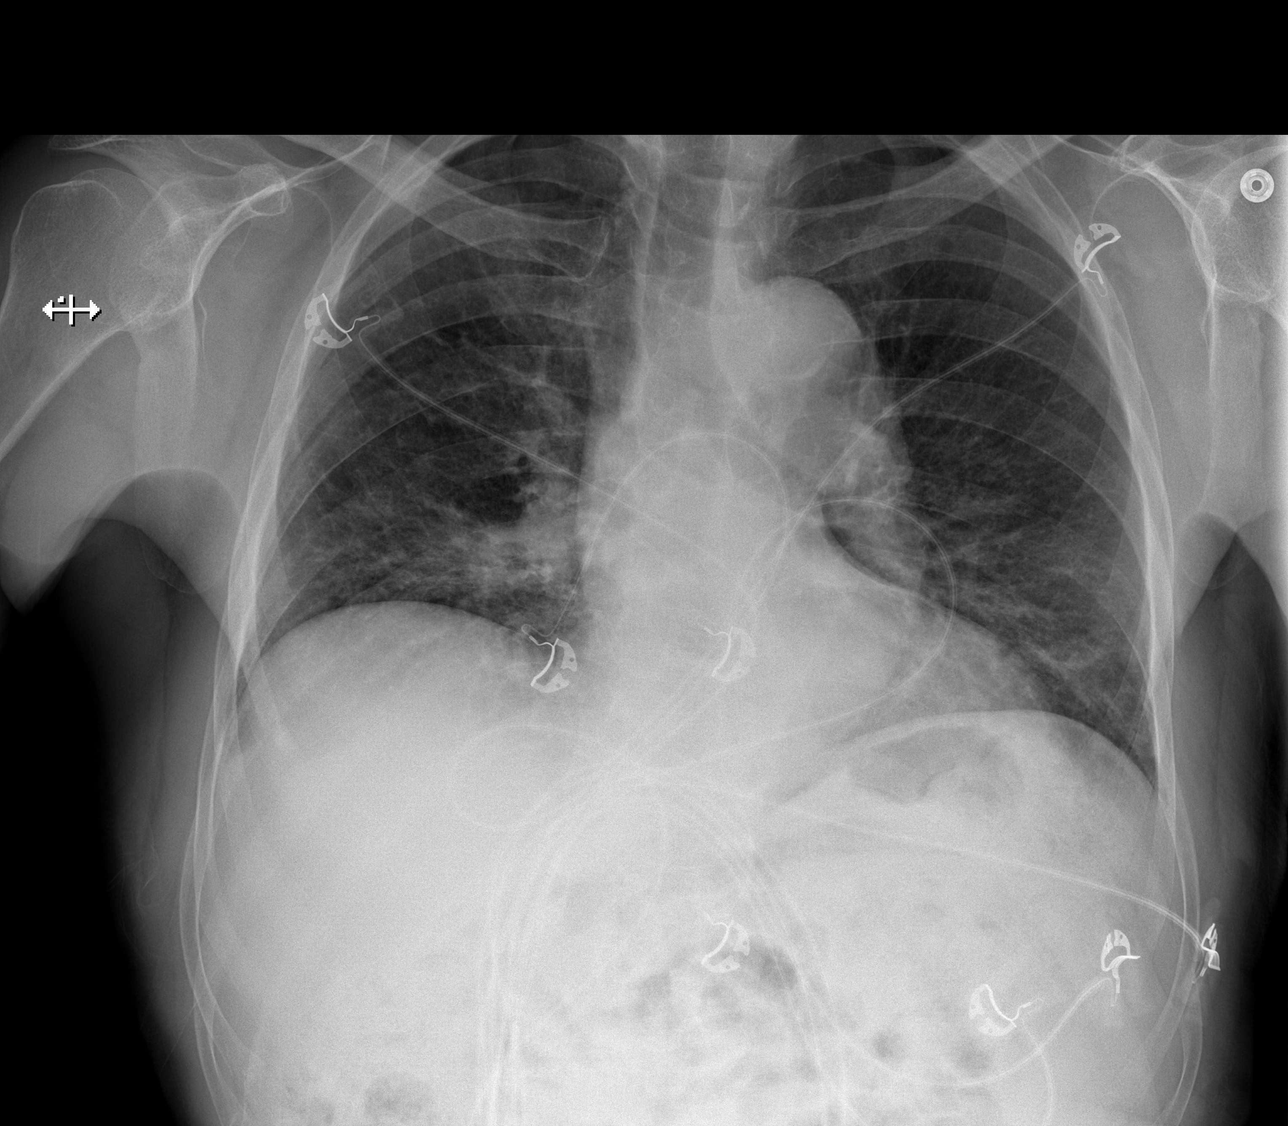

[t abdomen supine]
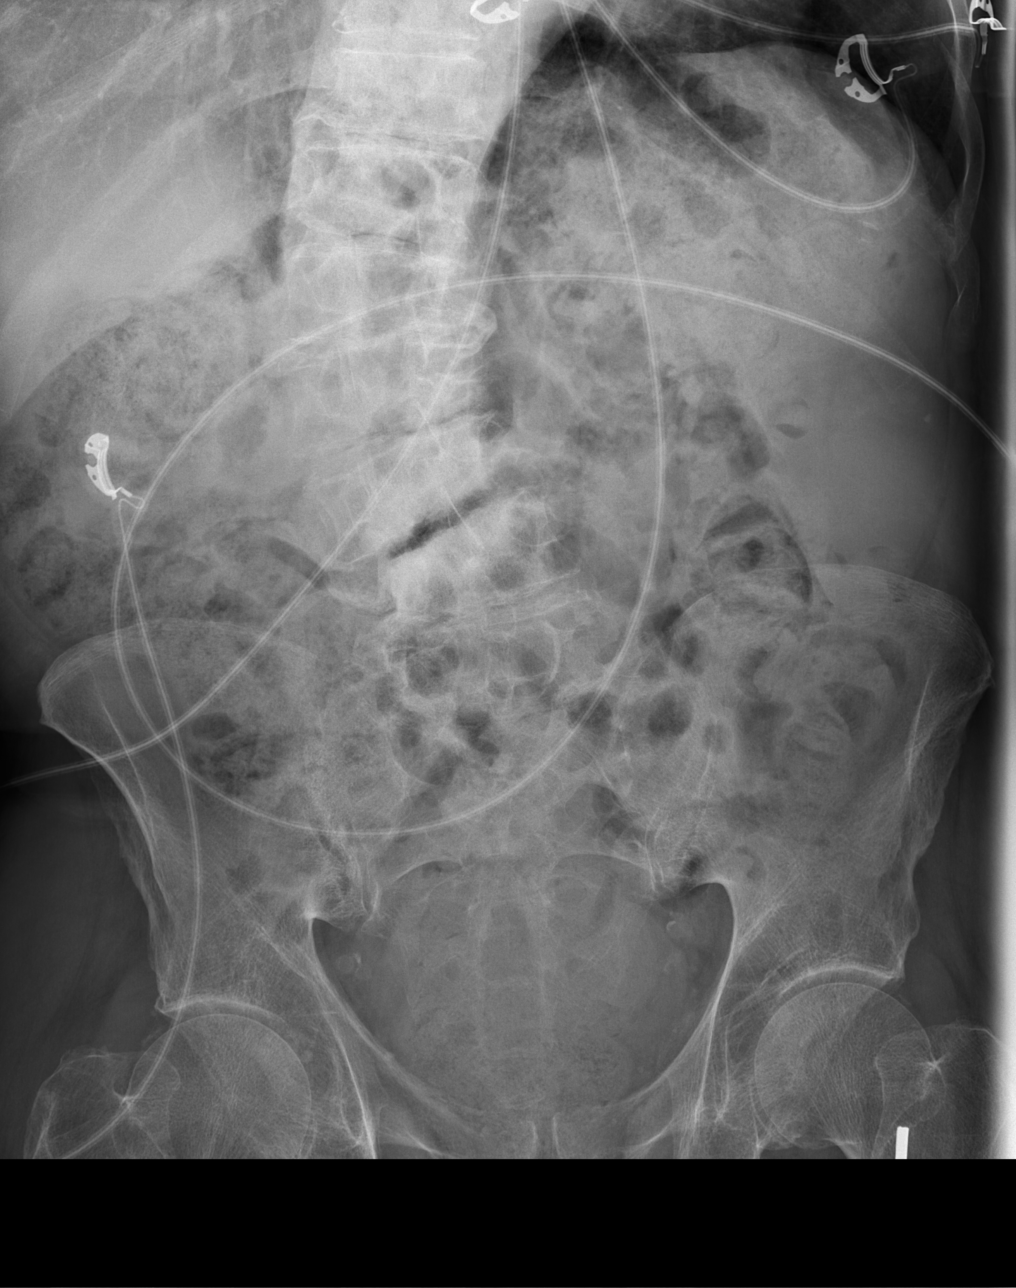

[3 of 3 positions shown; findings below may reference images not displayed]

FINDINGS: Normal cardiac silhouette. New RIGHT lower lobe airspace disease.
Upper lungs are clear. No pleural fluid.

No dilated large or small bowel. Stool and gas the rectum. No
pathologic calcification. Dextroscoliosis of the spine with endplate
degeneration.
IMPRESSION: 1. Concern for subtle RIGHT lower lobe pneumonia versus asymmetric
edema.
2. No bowel obstruction. Moderate volume stool throughout the colon.

## 2017-10-26 MED ORDER — ALBUTEROL (5 MG/ML) CONTINUOUS INHALATION SOLN
10.0000 mg/h | INHALATION_SOLUTION | Freq: Once | RESPIRATORY_TRACT | Status: AC
Start: 2017-10-26 — End: 2017-10-26
  Administered 2017-10-26: 10 mg/h via RESPIRATORY_TRACT
  Filled 2017-10-26: qty 20

## 2017-10-26 MED ORDER — DOXYCYCLINE HYCLATE 100 MG PO CAPS: 100.0000 mg | ORAL_CAPSULE | Freq: Two times a day (BID) | ORAL | 0 refills | Status: DC

## 2017-10-26 MED ORDER — ONDANSETRON 4 MG PO TBDP
4.0000 mg | ORAL_TABLET | Freq: Once | ORAL | Status: AC
  Administered 2017-10-26: 4 mg via ORAL
  Filled 2017-10-26: qty 1

## 2017-10-26 MED ORDER — PEG 3350-KCL-NABCB-NACL-NASULF 236 G PO SOLR: 4000.0000 mL | Freq: Once | ORAL | 0 refills | Status: AC

## 2017-10-26 MED ORDER — DOXYCYCLINE HYCLATE 100 MG PO TABS
100.0000 mg | ORAL_TABLET | Freq: Once | ORAL | Status: AC
  Administered 2017-10-26: 100 mg via ORAL
  Filled 2017-10-26: qty 1

## 2017-10-26 MED ORDER — BISACODYL 10 MG RE SUPP: 10.0000 mg | RECTAL | 0 refills | Status: DC | PRN

## 2017-10-26 MED ORDER — AMOXICILLIN 500 MG PO CAPS: 1000.0000 mg | ORAL_CAPSULE | Freq: Three times a day (TID) | ORAL | 0 refills | Status: AC

## 2017-10-26 MED ORDER — MINERAL OIL RE ENEM
1.0000 | ENEMA | Freq: Once | RECTAL | Status: AC
  Administered 2017-10-26: 1 via RECTAL
  Filled 2017-10-26: qty 1

## 2017-10-26 MED ORDER — AMOXICILLIN 500 MG PO CAPS
1000.0000 mg | ORAL_CAPSULE | Freq: Once | ORAL | Status: AC
  Administered 2017-10-26: 1000 mg via ORAL
  Filled 2017-10-26: qty 2

## 2017-10-26 NOTE — ED Triage Notes (Signed)
He c/o issues with constipation x one month. He also has had some nausea with a couple of episodes of emesis. He also c/o mild, generalized abd. Discomfort. He is in no distress.

## 2017-10-26 NOTE — ED Notes (Signed)
ED Provider at bedside. 

## 2017-10-26 NOTE — Discharge Instructions (Signed)
Please take the medications prescribed for the constipation.  See her primary care doctor in 5-7 days for further assessment.  Also, we noted that you have a pneumonia.  Start taking the antibiotics as prescribed.  Return to the ER immediately if you start having worsening in your breathing.

## 2017-10-26 NOTE — ED Notes (Signed)
Pt complaining of "heaving breathing that keeps him up at night."

## 2017-10-26 NOTE — ED Provider Notes (Addendum)
[facility] DEPT Provider Note   CSN: [id number] Arrival date & time: 10/26/17  6270     History   Chief Complaint Chief Complaint  Patient presents with  . Constipation    HPI Riley Eaton is a 77 y.o. male.  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    Patient reports that he has been constipated for the last several days, despite taking MiraLAX and increasing fiber in his diet.  Patient has not had a good bowel movement in 2 weeks. Patient denies any associated abdominal pain, there is no history of abdominal surgeries. + nausea.  Patient also complains of difficulty in breathing and audible wheezing yday.  No associated fevers.  Patient did have a cough with productive phlegm.  Past Medical History:  Diagnosis Date  . Chronic back pain   . 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 Coronary artery calcification seen on CT scan   . Nerve pain   . Thoracic aortic aneurysm [facility])     Patient Active Problem List   Diagnosis Date Noted  . 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 Thoracic aortic aneurysm ([place]) 10/03/2015  . Coronary artery calcification seen on CT scan 10/03/2015    Past Surgical History:  Procedure Laterality Date  . BACK SURGERY     09-2016  . back surgey         Home Medications    Prior to Admission medications   Medication Sig Start Date End Date Taking? Authorizing Provider  calcium-vitamin D (OSCAL WITH D) 500-200 MG-UNIT tablet Take 1 tablet by mouth daily with breakfast.   Yes [provider]  Diphenhydramine-APAP, sleep, (TYLENOL PM EXTRA STRENGTH PO) Take 1 tablet by mouth at bedtime.    Yes [provider]  DULoxetine (CYMBALTA) 60 MG capsule Take 1 capsule (60 mg total) by mouth 2 (two) times daily. 08/07/17  Yes Kathrynn Ducking, MD  HYDROcodone-acetaminophen (NORCO/VICODIN) 5-325 MG tablet Take 1 tablet by mouth  every 6 (six) hours as needed for moderate pain. Must last 28 days. 10/22/17  Yes Kathrynn Ducking, MD  oxyCODONE (OXYCONTIN) 80 mg 12 hr tablet Take 1 tablet (80 mg total) by mouth every 12 (twelve) hours. 10/22/17  Yes Kathrynn Ducking, MD  pregabalin (LYRICA) 200 MG capsule Take 1 capsule (200 mg total) by mouth 3 (three) times daily. 08/13/17  Yes Kathrynn Ducking, MD  promethazine (PHENERGAN) 25 MG tablet Take 1 tablet by mouth every 4 (four) hours as needed for nausea or vomiting.  08/02/15  Yes [provider]  tamsulosin (FLOMAX) 0.4 MG CAPS capsule Take 0.4 mg by mouth 2 (two) times daily.  08/29/15  Yes [provider]  traZODone (DESYREL) 50 MG tablet Take 25 mg by mouth at bedtime.    Yes [provider]  triamcinolone ointment (KENALOG) 0.1 % Apply 1 application topically 2 (two) times daily as needed for rash. 10/15/17  Yes [provider]  amoxicillin (AMOXIL) 500 MG capsule Take 2 capsules (1,000 mg total) by mouth 3 (three) times daily for 7 days. 10/26/17 11/02/17  Varney Biles, MD  bisacodyl (DULCOLAX) 10 MG suppository Place 1 suppository (10 mg total) rectally as needed for moderate constipation. 10/26/17   Varney Biles, MD  doxycycline (VIBRAMYCIN) 100 MG capsule Take 1 capsule (100 mg total) by mouth 2 (two) times daily. 10/26/17   Varney Biles, MD  polyethylene glycol (  GOLYTELY) 236 g solution Take 4,000 mLs by mouth once for 1 dose. 10/26/17 10/26/17  Varney Biles, MD    Family History Family History  Problem Relation Age of Onset  . Cancer Mother   . Heart attack Father     Social History Social History   Tobacco Use  . Smoking status: Never Smoker  . Smokeless tobacco: Never Used  Substance Use Topics  . Alcohol use: No  . Drug use: No     Allergies   Patient has no known allergies.   Review of Systems Review of Systems  Constitutional: Positive for activity change.  Respiratory: Positive for cough, shortness of  breath and wheezing.   Cardiovascular: Negative for chest pain.  Gastrointestinal: Positive for nausea. Negative for abdominal distention and abdominal pain.  Hematological: Does not bruise/bleed easily.  All other systems reviewed and are negative.    Physical Exam Updated Vital Signs BP (!) 173/94   Pulse 84   Temp 98.3 F (36.8 C)   Resp 17   Ht 5\' 7"  (1.702 m)   Wt 73 kg (161 lb)   SpO2 96%   BMI 25.22 kg/m   Physical Exam  Constitutional: He is oriented to person, place, and time. He appears well-developed.  HENT:  Head: Atraumatic.  Neck: Neck supple.  Cardiovascular: Normal rate.  Pulmonary/Chest: Effort normal. He has no wheezes.  Abdominal: He exhibits no distension. There is tenderness. There is no rebound and no guarding.  Neurological: He is alert and oriented to person, place, and time.  Skin: Skin is warm.  Nursing note and vitals reviewed.    ED Treatments / Results  Labs (all labs ordered are listed, but only abnormal results are displayed) Labs Reviewed  COMPREHENSIVE METABOLIC PANEL - Abnormal; Notable for the following components:      Result Value   Glucose, Bld 121 (*)    Alkaline Phosphatase 144 (*)    All other components within normal limits  CBC - Abnormal; Notable for the following components:   RBC 4.21 (*)    HCT 38.6 (*)    All other components within normal limits  URINALYSIS, ROUTINE W REFLEX MICROSCOPIC - Abnormal; Notable for the following components:   Ketones, ur 5 (*)    Bacteria, UA RARE (*)    All other components within normal limits  LIPASE, BLOOD    EKG  EKG Interpretation None       Radiology Dg Abd Acute W/chest  Result Date: 10/26/2017 CLINICAL DATA:  Constipation.  Vomiting EXAM: DG ABDOMEN ACUTE W/ 1V CHEST COMPARISON:  08/23/2015 FINDINGS: Normal cardiac silhouette. New RIGHT lower lobe airspace disease. Upper lungs are clear. No pleural fluid. No dilated large or small bowel. Stool and gas the rectum. No  pathologic calcification. Dextroscoliosis of the spine with endplate degeneration. IMPRESSION: 1. Concern for subtle RIGHT lower lobe pneumonia versus asymmetric edema. 2. No bowel obstruction. Moderate volume stool throughout the colon. Electronically Signed   By: Suzy Bouchard M.D.   On: 10/26/2017 11:52    Procedures Procedures (including critical care time)  Medications Ordered in ED Medications  albuterol (PROVENTIL,VENTOLIN) solution continuous neb (10 mg/hr Nebulization Given 10/26/17 1132)  doxycycline (VIBRA-TABS) tablet 100 mg (100 mg Oral Given 10/26/17 1342)  amoxicillin (AMOXIL) capsule 1,000 mg (1,000 mg Oral Given 10/26/17 1342)  mineral oil enema 1 enema (1 enema Rectal Given 10/26/17 1343)     Initial Impression / Assessment and Plan / ED Course  I have reviewed the  triage vital signs and the nursing notes.  Pertinent labs & imaging results that were available during my care of the patient were reviewed by me and considered in my medical decision making (see chart for details).  Clinical Course as of Oct 27 1422  Sun Oct 26, 2017  1422 Results from the ER workup discussed with the patient face to face and all questions answered to the best of my ability.  Patient will be started on Treatment.  PSI and curb 65 support outpatient management. Strict ER return precautions have been discussed.  Enema given in the ED.  We will discharged with further constipation meds.  Advised to follow-up with PCP if the symptoms continue.  [AN]    Clinical Course User Index [AN] Varney Biles, MD    77 year old male comes in with chief complaint of constipation.  Patient has not had a good bowel movement in 2 weeks, and has been constipated for several days now despite taking MiraLAX.  Patient is having some wheezing and shortness of breath as well, although my exam the lungs are completely clear.    Differential diagnosis includes ileus, constipation, SBO.  Patient has history of  thoracic artery aneurysm, so AAA also considered in the differential. There is no pulsatile mass. We will order an acute abdominal series and reassess.  Final Clinical Impressions(s) / ED Diagnoses   Final diagnoses:  Constipation, unspecified constipation type  Community acquired pneumonia of right lower lobe of lung [facility])    ED Discharge Orders        Ordered    polyethylene glycol (GOLYTELY) 236 g solution   Once     10/26/17 1420    bisacodyl (DULCOLAX) 10 MG suppository  As needed     10/26/17 1420    amoxicillin (AMOXIL) 500 MG capsule  3 times daily     10/26/17 1421    doxycycline (VIBRAMYCIN) 100 MG capsule  2 times daily     10/26/17 1421       Varney Biles, MD 10/26/17 [place], Brizeyda Holtmeyer, MD 10/26/17 1424

## 2017-10-26 NOTE — ED Notes (Signed)
Respiratory made aware that patient needs hour long nebulizer treatment.

## 2017-10-26 NOTE — Progress Notes (Signed)
Neb tx delayed due to pt in x-ray.

## 2017-10-26 NOTE — ED Notes (Signed)
Patient unable to defecate at this time. Dr. Kathrynn Humble made aware.

## 2017-10-26 NOTE — ED Notes (Signed)
This RN was discharging patient and the patient began vomiting. New orders from Dr. Kathrynn Humble for 4mg  zofran ODT.

## 2017-11-11 ENCOUNTER — Other Ambulatory Visit: Payer: Self-pay | Admitting: Neurology

## 2017-11-21 ENCOUNTER — Other Ambulatory Visit: Payer: Self-pay | Admitting: Neurology

## 2017-11-21 MED ORDER — HYDROCODONE-ACETAMINOPHEN 5-325 MG PO TABS: 1.0000 | ORAL_TABLET | Freq: Four times a day (QID) | ORAL | 0 refills | Status: DC | PRN

## 2017-11-21 MED ORDER — OXYCODONE HCL ER 80 MG PO T12A: 80.0000 mg | EXTENDED_RELEASE_TABLET | Freq: Two times a day (BID) | ORAL | 0 refills | Status: DC

## 2017-11-21 NOTE — Addendum Note (Signed)
Addended by: Lester Calmar A on: 11/21/2017 08:49 AM   Modules accepted: Orders

## 2017-11-21 NOTE — Telephone Encounter (Signed)
Pt calling for a refill on his oxyCODONE (OXYCONTIN) 80 mg 12 hr tablet and HYDROcodone-acetaminophen (NORCO/VICODIN) 5-325 MG tablet please [facility] to  [id number] [place], [place] - [address] [phone] (Phone) [phone] (Fax)

## 2017-11-21 NOTE — Telephone Encounter (Signed)
I called pt, advised him that [facility] [place]. Pt verbalized understanding.

## 2017-12-22 ENCOUNTER — Telehealth: Payer: Self-pay | Admitting: Neurology

## 2017-12-22 MED ORDER — HYDROCODONE-ACETAMINOPHEN 5-325 MG PO TABS: 1.0000 | ORAL_TABLET | Freq: Four times a day (QID) | ORAL | 0 refills | Status: DC | PRN

## 2017-12-22 MED ORDER — OXYCODONE HCL ER 80 MG PO T12A: 80.0000 mg | EXTENDED_RELEASE_TABLET | Freq: Two times a day (BID) | ORAL | 0 refills | Status: DC

## 2017-12-22 NOTE — Addendum Note (Signed)
Addended by: Kathrynn Ducking on: 12/22/2017 12:53 PM   Modules accepted: Orders

## 2017-12-22 NOTE — Telephone Encounter (Signed)
Patient requesting refill of oxyCODONE (OXYCONTIN) 80 mg 12 hr tablet [facility] [place]. ° °

## 2017-12-22 NOTE — Telephone Encounter (Signed)
The [facility] registry was checked, the prescription for hydrocodone and oxycodone will be sent in.

## 2017-12-31 ENCOUNTER — Ambulatory Visit: Payer: Medicare Other | Admitting: Neurology

## 2017-12-31 ENCOUNTER — Encounter: Payer: Self-pay | Admitting: Neurology

## 2017-12-31 VITALS — BP 100/60 | HR 77 | Wt 159.5 lb

## 2017-12-31 DIAGNOSIS — M5442 Lumbago with sciatica, left side: Secondary | ICD-10-CM | POA: Diagnosis not present

## 2017-12-31 DIAGNOSIS — M25562 Pain in left knee: Secondary | ICD-10-CM

## 2017-12-31 DIAGNOSIS — M25561 Pain in right knee: Secondary | ICD-10-CM | POA: Diagnosis not present

## 2017-12-31 DIAGNOSIS — Z79891 Long term (current) use of opiate analgesic: Secondary | ICD-10-CM | POA: Diagnosis not present

## 2017-12-31 DIAGNOSIS — G8929 Other chronic pain: Secondary | ICD-10-CM

## 2017-12-31 DIAGNOSIS — M5441 Lumbago with sciatica, right side: Secondary | ICD-10-CM

## 2017-12-31 MED ORDER — ALLOPURINOL 100 MG PO TABS: 100.0000 mg | ORAL_TABLET | Freq: Every day | ORAL | 1 refills | Status: DC

## 2017-12-31 NOTE — Progress Notes (Signed)
Reason for visit: Chronic low back pain  Riley Eaton is an 77 y.o. male  History of present illness:  Riley Eaton is a 77 year old right-handed white male with a history of chronic low back pain and left leg discomfort.  The patient also indicates that he has a history of gout, he claims that he was followed by Dr. Charlestine Night who has now retired.  He has bilateral shoulder joint discomfort that was apparently attributed to gout, he had been on allopurinol and colchicine previously.  The patient also reports bilateral knee discomfort, he has not been followed through orthopedic surgery.  He walks with a cane, he has not had any falls.  He remains on chronic opioid therapy.  He returns for an evaluation.  Past Medical History:  Diagnosis Date  . Chronic back pain   . 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 Coronary artery calcification seen on CT scan   . Nerve pain   . Thoracic aortic aneurysm [facility])     Past Surgical History:  Procedure Laterality Date  . BACK SURGERY     09-2016  . back surgey      Family History  Problem Relation Age of Onset  . Cancer Mother   . Heart attack Father     Social history:  reports that he has never smoked. He has never used smokeless tobacco. He reports that he does not drink alcohol or use drugs.   No Known Allergies  Medications:  Prior to Admission medications   Medication Sig Start Date End Date Taking? Authorizing Provider  bisacodyl (DULCOLAX) 10 MG suppository Place 1 suppository (10 mg total) rectally as needed for moderate constipation. 10/26/17  Yes Varney Biles, MD  calcium-vitamin D (OSCAL WITH D) 500-200 MG-UNIT tablet Take 1 tablet by mouth daily with breakfast.   Yes [provider]  Diphenhydramine-APAP, sleep, (TYLENOL PM EXTRA STRENGTH PO) Take 1 tablet by mouth at bedtime.    Yes [provider]  doxycycline (VIBRAMYCIN) 100 MG capsule Take 1 capsule (100 mg total) by mouth 2 (two) times daily. 10/26/17   Yes Varney Biles, MD  DULoxetine (CYMBALTA) 60 MG capsule TAKE ONE CAPSULE BY MOUTH TWICE A DAY 11/11/17  Yes Kathrynn Ducking, MD  HYDROcodone-acetaminophen (NORCO/VICODIN) 5-325 MG tablet Take 1 tablet by mouth every 6 (six) hours as needed for moderate pain. Must last 28 days. 12/22/17  Yes Kathrynn Ducking, MD  oxyCODONE (OXYCONTIN) 80 mg 12 hr tablet Take 1 tablet (80 mg total) by mouth every 12 (twelve) hours. 12/22/17  Yes Kathrynn Ducking, MD  pregabalin (LYRICA) 200 MG capsule Take 1 capsule (200 mg total) by mouth 3 (three) times daily. 08/13/17  Yes Kathrynn Ducking, MD  promethazine (PHENERGAN) 25 MG tablet Take 1 tablet by mouth every 4 (four) hours as needed for nausea or vomiting.  08/02/15  Yes [provider]  tamsulosin (FLOMAX) 0.4 MG CAPS capsule Take 0.4 mg by mouth 2 (two) times daily.  08/29/15  Yes [provider]  traZODone (DESYREL) 50 MG tablet Take 25 mg by mouth at bedtime.    Yes [provider]  triamcinolone ointment (KENALOG) 0.1 % Apply 1 application topically 2 (two) times daily as needed for rash. 10/15/17  Yes [provider]  allopurinol (ZYLOPRIM) 100 MG tablet Take 1 tablet (100 mg total) by mouth daily. 12/31/17   Kathrynn Ducking, MD    ROS:  Out of a complete 14 system review of  symptoms, the patient complains only of the following symptoms, and all other reviewed systems are negative.  Fatigue Hearing loss, ringing in the ears Constipation, incontinence of the bowels Frequent waking  Blood pressure 100/60, pulse 77, weight 159 lb 8 oz (72.3 kg), SpO2 98 %.  Physical Exam  General: The patient is alert and cooperative at the time of the examination.  Skin: No significant peripheral edema is noted.   Neurologic Exam  Mental status: The patient is alert and oriented x 3 at the time of the examination. The patient has apparent normal recent and remote memory, with an apparently normal attention span and  concentration ability.   Cranial nerves: Facial symmetry is present. Speech is normal, no aphasia or dysarthria is noted. Extraocular movements are full. Visual fields are full.  Motor: The patient has good strength in all 4 extremities.  Sensory examination: Soft touch sensation is symmetric on the face, arms, and legs.  Coordination: The patient has good finger-nose-finger and heel-to-shin bilaterally.  Gait and station: The patient has a stooped posture, he can walk without a cane, he has a slightly wide-based gait. Romberg is negative. No drift is seen.  Reflexes: Deep tendon reflexes are symmetric.   Assessment/Plan:  1.  Chronic pain syndrome, low back pain  2.  History of gout, bilateral shoulder pain  3.  Bilateral knee pain, degenerative arthritis  The patient will be sent to orthopedic surgery for evaluation of the knee discomfort.  He will be placed on allopurinol taking 100 mg daily.  He will follow-up through this office in 6 months.  The opiate medications will be continued, he will follow-up in 6 months.  In the future we will need to consider referral to a pain center.  A urine drug screen will be done today.  Jill Alexanders MD 12/31/2017 11:52 AM  [facility] [address] [place], [place] [address]  Phone [phone] Fax [phone]

## 2018-01-02 ENCOUNTER — Telehealth: Payer: Self-pay | Admitting: Neurology

## 2018-01-06 NOTE — Telephone Encounter (Signed)
Error

## 2018-01-08 LAB — COMPLIANCE DRUG ANALYSIS, UR

## 2018-01-21 ENCOUNTER — Other Ambulatory Visit: Payer: Self-pay | Admitting: Neurology

## 2018-01-21 MED ORDER — OXYCODONE HCL ER 80 MG PO T12A: 80.0000 mg | EXTENDED_RELEASE_TABLET | Freq: Two times a day (BID) | ORAL | 0 refills | Status: DC

## 2018-01-21 NOTE — Telephone Encounter (Signed)
Pt requesting a refill for oxyCODONE (OXYCONTIN) 80 mg 12 [facility]

## 2018-02-17 ENCOUNTER — Other Ambulatory Visit: Payer: Self-pay | Admitting: Neurology

## 2018-02-17 MED ORDER — HYDROCODONE-ACETAMINOPHEN 5-325 MG PO TABS: 1.0000 | ORAL_TABLET | Freq: Four times a day (QID) | ORAL | 0 refills | Status: DC | PRN

## 2018-02-17 NOTE — Telephone Encounter (Signed)
Patient requesting refill of HYDROcodone-acetaminophen (NORCO/VICODIN) 5-325 MG tablet [facility] [place]. I advised Dr. Jannifer Franklin is out of the office and will send message to his nurse.

## 2018-02-17 NOTE — Addendum Note (Signed)
Addended by: Rossie Muskrat L on: 02/17/2018 11:45 AM   Modules accepted: Orders

## 2018-02-23 ENCOUNTER — Other Ambulatory Visit: Payer: Self-pay | Admitting: Neurology

## 2018-02-23 MED ORDER — OXYCODONE HCL ER 80 MG PO T12A: 80.0000 mg | EXTENDED_RELEASE_TABLET | Freq: Two times a day (BID) | ORAL | 0 refills | Status: DC

## 2018-02-23 NOTE — Addendum Note (Signed)
Addended by: Hope Pigeon on: 02/23/2018 08:54 AM   Modules accepted: Orders

## 2018-02-23 NOTE — Telephone Encounter (Signed)
Patient requesting refill of oxyCODONE (OXYCONTIN) 80 mg 12 hr tablet [facility] [place]. I advised Dr. Jannifer Franklin is out of the office and will send to his nurse.

## 2018-02-24 ENCOUNTER — Telehealth: Payer: Self-pay | Admitting: *Deleted

## 2018-02-24 NOTE — Telephone Encounter (Signed)
Initiated urgent PA for oxycontin 80mg  12hr tab on covermymeds. We received the request today.  Key: [id number]. Waiting on notes to be attached to PA and then will submit

## 2018-02-24 NOTE — Telephone Encounter (Signed)
PA submitted. Waiting on determination. "[employer] is reviewing your PA request. Typically an electronic response will be received within 72 hours"

## 2018-02-25 MED ORDER — OXYCODONE HCL ER 80 MG PO T12A: 80.0000 mg | EXTENDED_RELEASE_TABLET | Freq: Two times a day (BID) | ORAL | 0 refills | Status: DC

## 2018-02-25 NOTE — Telephone Encounter (Signed)
PA approved. Request Reference Number: [id number]. OXYCONTIN TAB 80MG  CR is approved through 08/11/2018. For further questions, call [phone].

## 2018-02-25 NOTE — Addendum Note (Signed)
Addended by: Rossie Muskrat L on: 02/25/2018 10:10 AM   Modules accepted: Orders

## 2018-03-16 ENCOUNTER — Other Ambulatory Visit: Payer: Self-pay | Admitting: Neurology

## 2018-03-25 ENCOUNTER — Other Ambulatory Visit: Payer: Self-pay | Admitting: Neurology

## 2018-03-25 MED ORDER — OXYCODONE HCL ER 80 MG PO T12A: 80.0000 mg | EXTENDED_RELEASE_TABLET | Freq: Two times a day (BID) | ORAL | 0 refills | Status: DC

## 2018-03-25 MED ORDER — HYDROCODONE-ACETAMINOPHEN 5-325 MG PO TABS: 1.0000 | ORAL_TABLET | Freq: Four times a day (QID) | ORAL | 0 refills | Status: DC | PRN

## 2018-03-25 NOTE — Telephone Encounter (Signed)
Pt request refill for HYDROcodone-acetaminophen (NORCO/VICODIN) 5-325 MG tablet and oxyCODONE (OXYCONTIN) 80 mg 12 hr tablet [id number] [place], [place] - [address]

## 2018-03-25 NOTE — Telephone Encounter (Signed)
Rx registry checked. Last fill date for oxycontin is 02/25/18 for #60 and hydrocodone is 02/17/18 for #60. Next OV is 07/06/18.

## 2018-04-24 ENCOUNTER — Other Ambulatory Visit: Payer: Self-pay | Admitting: Neurology

## 2018-04-24 MED ORDER — OXYCODONE HCL ER 80 MG PO T12A: 80.0000 mg | EXTENDED_RELEASE_TABLET | Freq: Two times a day (BID) | ORAL | 0 refills | Status: DC

## 2018-04-24 NOTE — Telephone Encounter (Signed)
Pt request refill for oxyCODONE (OXYCONTIN) 80 mg 12 hr tablet [id number] [place], [place] - [address]. Pt will be out this weekend

## 2018-05-05 ENCOUNTER — Other Ambulatory Visit: Payer: Self-pay | Admitting: Neurology

## 2018-05-25 ENCOUNTER — Other Ambulatory Visit: Payer: Self-pay | Admitting: Neurology

## 2018-05-25 MED ORDER — OXYCODONE HCL ER 80 MG PO T12A: 80.0000 mg | EXTENDED_RELEASE_TABLET | Freq: Two times a day (BID) | ORAL | 0 refills | Status: DC

## 2018-05-25 NOTE — Telephone Encounter (Signed)
Patient requesting refill of oxyCODONE (OXYCONTIN) 80 mg 12 hr tablet [facility] [place].

## 2018-05-25 NOTE — Addendum Note (Signed)
Addended by: Gildardo Griffes on: 05/25/2018 03:07 PM   Modules accepted: Orders

## 2018-05-25 NOTE — Telephone Encounter (Signed)
Last fill 04/24/18 #60 tabs per registry.

## 2018-06-18 ENCOUNTER — Other Ambulatory Visit: Payer: Self-pay | Admitting: Neurology

## 2018-06-23 ENCOUNTER — Telehealth: Payer: Self-pay | Admitting: Neurology

## 2018-06-23 MED ORDER — OXYCODONE HCL ER 80 MG PO T12A: 80.0000 mg | EXTENDED_RELEASE_TABLET | Freq: Two times a day (BID) | ORAL | 0 refills | Status: DC

## 2018-06-23 NOTE — Telephone Encounter (Signed)
The [facility] registry was checked, the oxycodone prescription will be sent in.

## 2018-06-23 NOTE — Addendum Note (Signed)
Addended by: Kathrynn Ducking on: 06/23/2018 02:05 PM   Modules accepted: Orders

## 2018-06-23 NOTE — Telephone Encounter (Signed)
Pt has requested a refill for oxyCODONE (OXYCONTIN) 80 mg 12 hr tablet and      HYDROcodone-acetaminophen (NORCO/VICODIN) 5-325 MG tablet   [id number] [place], [place] - [address]

## 2018-07-06 ENCOUNTER — Telehealth: Payer: Self-pay | Admitting: Neurology

## 2018-07-06 ENCOUNTER — Ambulatory Visit: Payer: Medicare Other | Admitting: Neurology

## 2018-07-06 ENCOUNTER — Encounter: Payer: Self-pay | Admitting: Neurology

## 2018-07-06 VITALS — Resp 20 | Ht 67.0 in | Wt 158.0 lb

## 2018-07-06 DIAGNOSIS — G8929 Other chronic pain: Secondary | ICD-10-CM | POA: Diagnosis not present

## 2018-07-06 DIAGNOSIS — M5441 Lumbago with sciatica, right side: Secondary | ICD-10-CM

## 2018-07-06 DIAGNOSIS — M5442 Lumbago with sciatica, left side: Secondary | ICD-10-CM

## 2018-07-06 MED ORDER — HYDROCODONE-ACETAMINOPHEN 5-325 MG PO TABS: 1.0000 | ORAL_TABLET | Freq: Four times a day (QID) | ORAL | 0 refills | Status: DC | PRN

## 2018-07-06 NOTE — Telephone Encounter (Signed)
[id number]  Asking for a call from RN Faith re: pt's HYDROcodone-acetaminophen (NORCO/VICODIN) 5-325 MG tablet

## 2018-07-06 NOTE — Progress Notes (Signed)
Reason for visit: Chronic pain  Riley Eaton is an 77 y.o. male  History of present illness:  Riley Eaton is a 77 year old right-handed white male with a history of chronic low back pain.  The patient is satisfied with his pain level using the OxyContin twice daily and occasionally using hydrocodone if needed for breakthrough pain.  The patient uses a cane for ambulation, he is unable to straighten up completely with walking.  He has not had any falls.  He has to get up frequently at night because of pain.  He returns to this office for an evaluation.  The patient believes that his mobility has improved over the last year with the use of the medication.  Past Medical History:  Diagnosis Date  . Chronic back pain   . 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 Coronary artery calcification seen on CT scan   . Nerve pain   . Thoracic aortic aneurysm [facility])     Past Surgical History:  Procedure Laterality Date  . BACK SURGERY     09-2016  . back surgey      Family History  Problem Relation Age of Onset  . Cancer Mother   . Heart attack Father     Social history:  reports that he has never smoked. He has never used smokeless tobacco. He reports that he does not drink alcohol or use drugs.   No Known Allergies  Medications:  Prior to Admission medications   Medication Sig Start Date End Date Taking? Authorizing Provider  allopurinol (ZYLOPRIM) 100 MG tablet TAKE 1 TABLET BY MOUTH EVERY DAY 06/18/18  Yes Kathrynn Ducking, MD  bisacodyl (DULCOLAX) 10 MG suppository Place 1 suppository (10 mg total) rectally as needed for moderate constipation. 10/26/17  Yes Varney Biles, MD  calcium-vitamin D (OSCAL WITH D) 500-200 MG-UNIT tablet Take 1 tablet by mouth daily with breakfast.   Yes [provider]  Diphenhydramine-APAP, sleep, (TYLENOL PM EXTRA STRENGTH PO) Take 1 tablet by mouth at bedtime.    Yes [provider]  doxycycline (VIBRAMYCIN) 100 MG capsule Take 1  capsule (100 mg total) by mouth 2 (two) times daily. 10/26/17  Yes Varney Biles, MD  DULoxetine (CYMBALTA) 60 MG capsule TAKE 1 CAPSULE BY MOUTH TWICE A DAY 05/05/18  Yes Kathrynn Ducking, MD  HYDROcodone-acetaminophen (NORCO/VICODIN) 5-325 MG tablet Take 1 tablet by mouth every 6 (six) hours as needed for moderate pain. Must last 28 days. 03/25/18  Yes Kathrynn Ducking, MD  LYRICA 200 MG capsule TAKE 1 CAPSULE 3 TIMES DAILY 03/16/18  Yes Kathrynn Ducking, MD  oxyCODONE (OXYCONTIN) 80 mg 12 hr tablet Take 1 tablet (80 mg total) by mouth every 12 (twelve) hours. 06/23/18  Yes Kathrynn Ducking, MD  promethazine (PHENERGAN) 25 MG tablet Take 1 tablet by mouth every 4 (four) hours as needed for nausea or vomiting.  08/02/15  Yes [provider]  tamsulosin (FLOMAX) 0.4 MG CAPS capsule Take 0.4 mg by mouth 2 (two) times daily.  08/29/15  Yes [provider]  traZODone (DESYREL) 50 MG tablet Take 25 mg by mouth at bedtime.    Yes [provider]  triamcinolone ointment (KENALOG) 0.1 % Apply 1 application topically 2 (two) times daily as needed for rash. 10/15/17  Yes [provider]    ROS:  Out of a complete 14 system review of symptoms, the patient complains only of the following symptoms, and all other reviewed systems are  negative.  Chronic back pain, walking difficulty  Resp. rate 20, height 5\' 7"  (1.702 m), weight 158 lb (71.7 kg).  Physical Exam  General: The patient is alert and cooperative at the time of the examination.  Skin: No significant peripheral edema is noted.   Neurologic Exam  Mental status: The patient is alert and oriented x 3 at the time of the examination. The patient has apparent normal recent and remote memory, with an apparently normal attention span and concentration ability.   Cranial nerves: Facial symmetry is present. Speech is normal, no aphasia or dysarthria is noted. Extraocular movements are full. Visual fields are  full.  Motor: The patient has good strength in all 4 extremities.  Sensory examination: Soft touch sensation is symmetric on the face, arms, and legs.  Coordination: The patient has good finger-nose-finger and heel-to-shin bilaterally.  Gait and station: The patient has a stooped posture with walking, he normally uses a cane.  Romberg is negative. No drift is seen.  Reflexes: Deep tendon reflexes are symmetric.   Assessment/Plan:  1.  Chronic low back pain  The patient will continue to take the OxyContin, a prescription for hydrocodone was sent in.  The patient will be seen in 6 months, at that time we will consider a referral to a pain center.  Jill Alexanders MD 07/06/2018 11:07 AM  [facility] [address] [place], [place] [address]  Phone [phone] Fax [phone]

## 2018-07-06 NOTE — Telephone Encounter (Signed)
Spoke with pharmacist and confirmed pt. is taking Oxycontin for routine pain mx and PRN Hydrocodone for breakthru pain; pt. has been stable on both of these medications for some time/fim

## 2018-07-14 ENCOUNTER — Telehealth: Payer: Self-pay | Admitting: *Deleted

## 2018-07-14 NOTE — Telephone Encounter (Signed)
PA (proactive for 2020) for Oxycontin ER 80mg  #60/30 completed by phone with [employer], phone# [phone]. Dx: Chronic lbp (M54.5, G89.29). Tried and failed meds: Hydrocodone, Lyrica, Cymbalta, Lidoderm patches, Gabapentin, Naproxen, Oxycontin 40mg , Oxycodone, Tizanidine, Flexeril, Tylenol, Ibuprofen. Morphine equivalent value: 260. Per [employer], pt. has no acct. on file for next year, so this PA may not be approved/fim

## 2018-07-14 NOTE — Telephone Encounter (Signed)
Fax received from [facility], phone# [phone]. Oxycontin ER 80mg  approved thru 08/12/19 under pt's Medicare Part D benefit. This was a proactive PA, and decision is based on coverage criteria for the 2020 benefit year and will be effective 08/12/18. Ref# [id number]

## 2018-07-22 ENCOUNTER — Other Ambulatory Visit: Payer: Self-pay | Admitting: Neurology

## 2018-07-22 MED ORDER — OXYCODONE HCL ER 80 MG PO T12A: 80.0000 mg | EXTENDED_RELEASE_TABLET | Freq: Two times a day (BID) | ORAL | 0 refills | Status: DC

## 2018-07-22 NOTE — Telephone Encounter (Signed)
Pt requesting refills for oxyCODONE (OXYCONTIN) 80 mg 12 hr tablet sent to [facility]

## 2018-07-22 NOTE — Addendum Note (Signed)
Addended by: Kathrynn Ducking on: 07/22/2018 02:48 PM   Modules accepted: Orders

## 2018-07-22 NOTE — Telephone Encounter (Signed)
Dr. Jannifer Franklin, please advise if ok to refill Oxycontin 80 mg 12 hr tab. Last o/v was 07/06/2018 next o/v is 02/01/2019. Drug registry checked: Last pick up was 06/23/2018 # 60 for a 30 day supply given by our office.

## 2018-07-22 NOTE — Telephone Encounter (Signed)
The prescription for oxycodone was sent in. 

## 2018-08-20 ENCOUNTER — Other Ambulatory Visit: Payer: Self-pay | Admitting: Neurology

## 2018-08-20 MED ORDER — OXYCODONE HCL ER 80 MG PO T12A: 80.0000 mg | EXTENDED_RELEASE_TABLET | Freq: Two times a day (BID) | ORAL | 0 refills | Status: DC

## 2018-08-20 NOTE — Telephone Encounter (Signed)
Patient's last ov 07/06/18, next ov scheduled for 02/01/2019. Per [place] registry, last fill of Oxycontin 80 mg tablet #60 was on 07/22/18 prescribed by Dr. Jannifer Franklin. Refill for Oxycontin 80 mg tablet sent to Dr. Jannifer Franklin for authorization.

## 2018-08-20 NOTE — Telephone Encounter (Signed)
Pt is needing refill on his oxyCODONE (OXYCONTIN) 80 mg 12 hr tablet send [facility] [place]

## 2018-09-15 ENCOUNTER — Other Ambulatory Visit: Payer: Self-pay | Admitting: Neurology

## 2018-09-21 ENCOUNTER — Telehealth: Payer: Self-pay | Admitting: Neurology

## 2018-09-21 MED ORDER — HYDROCODONE-ACETAMINOPHEN 5-325 MG PO TABS: 1.0000 | ORAL_TABLET | Freq: Four times a day (QID) | ORAL | 0 refills | Status: DC | PRN

## 2018-09-21 MED ORDER — OXYCODONE HCL ER 80 MG PO T12A: 80.0000 mg | EXTENDED_RELEASE_TABLET | Freq: Two times a day (BID) | ORAL | 0 refills | Status: DC

## 2018-09-21 NOTE — Telephone Encounter (Signed)
The prescriptions for hydrocodone and oxycodone have been sent in.

## 2018-09-21 NOTE — Telephone Encounter (Signed)
Pt is needing a refill for his oxyCODONE (OXYCONTIN) 80 mg 12 hr tablet HYDROcodone-acetaminophen (NORCO/VICODIN) 5-325 MG tablet  [facility] [place]

## 2018-09-21 NOTE — Telephone Encounter (Signed)
 drug registry checked. Last refill for Oxycodone 80 mg was written on 08/20/18 # 60 for a 30 day supply.  Last refill for Hydrocodone 5-325 mg was written on 07/06/18 # 60 for a 15 day supply. Both refills were given by Dr. Jannifer Franklin and pt has been compliant with f/u's.

## 2018-09-23 ENCOUNTER — Other Ambulatory Visit: Payer: Self-pay | Admitting: Neurology

## 2018-09-24 ENCOUNTER — Inpatient Hospital Stay (HOSPITAL_COMMUNITY)
Admission: EM | Admit: 2018-09-24 | Discharge: 2018-10-06 | DRG: 444 | Disposition: A | Discharge status: Skilled Nursing Facility | Payer: Medicare Other | Attending: Internal Medicine | Admitting: Internal Medicine

## 2018-09-24 ENCOUNTER — Encounter (HOSPITAL_COMMUNITY): Payer: Self-pay | Admitting: Emergency Medicine

## 2018-09-24 ENCOUNTER — Emergency Department (HOSPITAL_COMMUNITY): Payer: Medicare Other

## 2018-09-24 ENCOUNTER — Other Ambulatory Visit: Payer: Self-pay

## 2018-09-24 DIAGNOSIS — I712 Thoracic aortic aneurysm, without rupture: Secondary | ICD-10-CM | POA: Diagnosis present

## 2018-09-24 DIAGNOSIS — R1011 Right upper quadrant pain: Secondary | ICD-10-CM

## 2018-09-24 DIAGNOSIS — X58XXXA Exposure to other specified factors, initial encounter: Secondary | ICD-10-CM | POA: Diagnosis not present

## 2018-09-24 DIAGNOSIS — R1310 Dysphagia, unspecified: Secondary | ICD-10-CM | POA: Diagnosis not present

## 2018-09-24 DIAGNOSIS — R1084 Generalized abdominal pain: Secondary | ICD-10-CM

## 2018-09-24 DIAGNOSIS — N138 Other obstructive and reflux uropathy: Secondary | ICD-10-CM | POA: Diagnosis present

## 2018-09-24 DIAGNOSIS — Z23 Encounter for immunization: Secondary | ICD-10-CM

## 2018-09-24 DIAGNOSIS — E43 Unspecified severe protein-calorie malnutrition: Secondary | ICD-10-CM | POA: Diagnosis not present

## 2018-09-24 DIAGNOSIS — E870 Hyperosmolality and hypernatremia: Secondary | ICD-10-CM | POA: Diagnosis not present

## 2018-09-24 DIAGNOSIS — R079 Chest pain, unspecified: Secondary | ICD-10-CM

## 2018-09-24 DIAGNOSIS — E860 Dehydration: Secondary | ICD-10-CM | POA: Diagnosis not present

## 2018-09-24 DIAGNOSIS — E871 Hypo-osmolality and hyponatremia: Secondary | ICD-10-CM | POA: Diagnosis not present

## 2018-09-24 DIAGNOSIS — G9341 Metabolic encephalopathy: Secondary | ICD-10-CM | POA: Diagnosis present

## 2018-09-24 DIAGNOSIS — F1123 Opioid dependence with withdrawal: Secondary | ICD-10-CM | POA: Diagnosis not present

## 2018-09-24 DIAGNOSIS — R338 Other retention of urine: Secondary | ICD-10-CM | POA: Diagnosis present

## 2018-09-24 DIAGNOSIS — M545 Low back pain: Secondary | ICD-10-CM | POA: Diagnosis present

## 2018-09-24 DIAGNOSIS — R112 Nausea with vomiting, unspecified: Secondary | ICD-10-CM

## 2018-09-24 DIAGNOSIS — R1110 Vomiting, unspecified: Secondary | ICD-10-CM

## 2018-09-24 DIAGNOSIS — E876 Hypokalemia: Secondary | ICD-10-CM | POA: Diagnosis not present

## 2018-09-24 DIAGNOSIS — Y92230 Patient room in hospital as the place of occurrence of the external cause: Secondary | ICD-10-CM | POA: Diagnosis not present

## 2018-09-24 DIAGNOSIS — G8929 Other chronic pain: Secondary | ICD-10-CM | POA: Diagnosis present

## 2018-09-24 DIAGNOSIS — K8032 Calculus of bile duct with acute cholangitis without obstruction: Secondary | ICD-10-CM | POA: Diagnosis not present

## 2018-09-24 DIAGNOSIS — K5900 Constipation, unspecified: Secondary | ICD-10-CM | POA: Diagnosis present

## 2018-09-24 DIAGNOSIS — N35912 Unspecified bulbous urethral stricture, male: Secondary | ICD-10-CM | POA: Diagnosis present

## 2018-09-24 DIAGNOSIS — I2510 Atherosclerotic heart disease of native coronary artery without angina pectoris: Secondary | ICD-10-CM | POA: Diagnosis present

## 2018-09-24 DIAGNOSIS — K8070 Calculus of gallbladder and bile duct without cholecystitis without obstruction: Secondary | ICD-10-CM | POA: Diagnosis present

## 2018-09-24 DIAGNOSIS — R339 Retention of urine, unspecified: Secondary | ICD-10-CM | POA: Diagnosis present

## 2018-09-24 DIAGNOSIS — Z79899 Other long term (current) drug therapy: Secondary | ICD-10-CM

## 2018-09-24 DIAGNOSIS — S3730XA Unspecified injury of urethra, initial encounter: Secondary | ICD-10-CM | POA: Diagnosis not present

## 2018-09-24 DIAGNOSIS — Z6824 Body mass index (BMI) 24.0-24.9, adult: Secondary | ICD-10-CM

## 2018-09-24 DIAGNOSIS — N401 Enlarged prostate with lower urinary tract symptoms: Secondary | ICD-10-CM | POA: Diagnosis present

## 2018-09-24 DIAGNOSIS — K8050 Calculus of bile duct without cholangitis or cholecystitis without obstruction: Secondary | ICD-10-CM

## 2018-09-24 DIAGNOSIS — Z781 Physical restraint status: Secondary | ICD-10-CM

## 2018-09-24 DIAGNOSIS — N365 Urethral false passage: Secondary | ICD-10-CM | POA: Diagnosis present

## 2018-09-24 DIAGNOSIS — I10 Essential (primary) hypertension: Secondary | ICD-10-CM | POA: Diagnosis present

## 2018-09-24 DIAGNOSIS — Z4659 Encounter for fitting and adjustment of other gastrointestinal appliance and device: Secondary | ICD-10-CM

## 2018-09-24 DIAGNOSIS — Z8249 Family history of ischemic heart disease and other diseases of the circulatory system: Secondary | ICD-10-CM

## 2018-09-24 HISTORY — DX: Opioid use, unspecified, uncomplicated: F11.90

## 2018-09-24 LAB — HEPATIC FUNCTION PANEL
ALT: 17 U/L (ref 0–44)
AST: 32 U/L (ref 15–41)
Albumin: 4 g/dL (ref 3.5–5.0)
Alkaline Phosphatase: 96 U/L (ref 38–126)
Bilirubin, Direct: 0.4 mg/dL — ABNORMAL HIGH (ref 0.0–0.2)
Indirect Bilirubin: 1 mg/dL — ABNORMAL HIGH (ref 0.3–0.9)
Total Bilirubin: 1.4 mg/dL — ABNORMAL HIGH (ref 0.3–1.2)
Total Protein: 6.7 g/dL (ref 6.5–8.1)

## 2018-09-24 LAB — CBC
HCT: 38.2 % — ABNORMAL LOW (ref 39.0–52.0)
Hemoglobin: 13.5 g/dL (ref 13.0–17.0)
MCH: 31.6 pg (ref 26.0–34.0)
MCHC: 35.3 g/dL (ref 30.0–36.0)
MCV: 89.5 fL (ref 80.0–100.0)
Platelets: 175 10*3/uL (ref 150–400)
RBC: 4.27 MIL/uL (ref 4.22–5.81)
RDW: 13 % (ref 11.5–15.5)
WBC: 9 10*3/uL (ref 4.0–10.5)
nRBC: 0 % (ref 0.0–0.2)

## 2018-09-24 LAB — BASIC METABOLIC PANEL
Anion gap: 13 (ref 5–15)
BUN: 16 mg/dL (ref 8–23)
CO2: 21 mmol/L — ABNORMAL LOW (ref 22–32)
Calcium: 9.4 mg/dL (ref 8.9–10.3)
Chloride: 104 mmol/L (ref 98–111)
Creatinine, Ser: 0.8 mg/dL (ref 0.61–1.24)
GFR calc Af Amer: >60 mL/min (ref >60)
GFR calc non Af Amer: >60 mL/min (ref >60)
Glucose, Bld: 165 mg/dL — ABNORMAL HIGH (ref 70–99)
Potassium: 4 mmol/L (ref 3.5–5.1)
Sodium: 138 mmol/L (ref 135–145)

## 2018-09-24 LAB — I-STAT TROPONIN, ED: Troponin i, poc: 0.02 ng/mL (ref 0.00–0.08)

## 2018-09-24 LAB — LIPASE, BLOOD: Lipase: 23 U/L (ref 11–51)

## 2018-09-24 LAB — I-STAT CREATININE, ED: Creatinine, Ser: 0.6 mg/dL — ABNORMAL LOW (ref 0.61–1.24)

## 2018-09-24 LAB — D-DIMER, QUANTITATIVE: D-Dimer, Quant: 0.48 ug/mL-FEU (ref 0.00–0.50)

## 2018-09-24 IMAGING — DX DG CHEST 2V
2 series · 2 of 2 positions shown · non-contrast
Comparison: [DATE]

CLINICAL DATA: Chest pain.  Nausea, vomiting.

EXAM:
CHEST - 2 VIEW

[w chest pa]
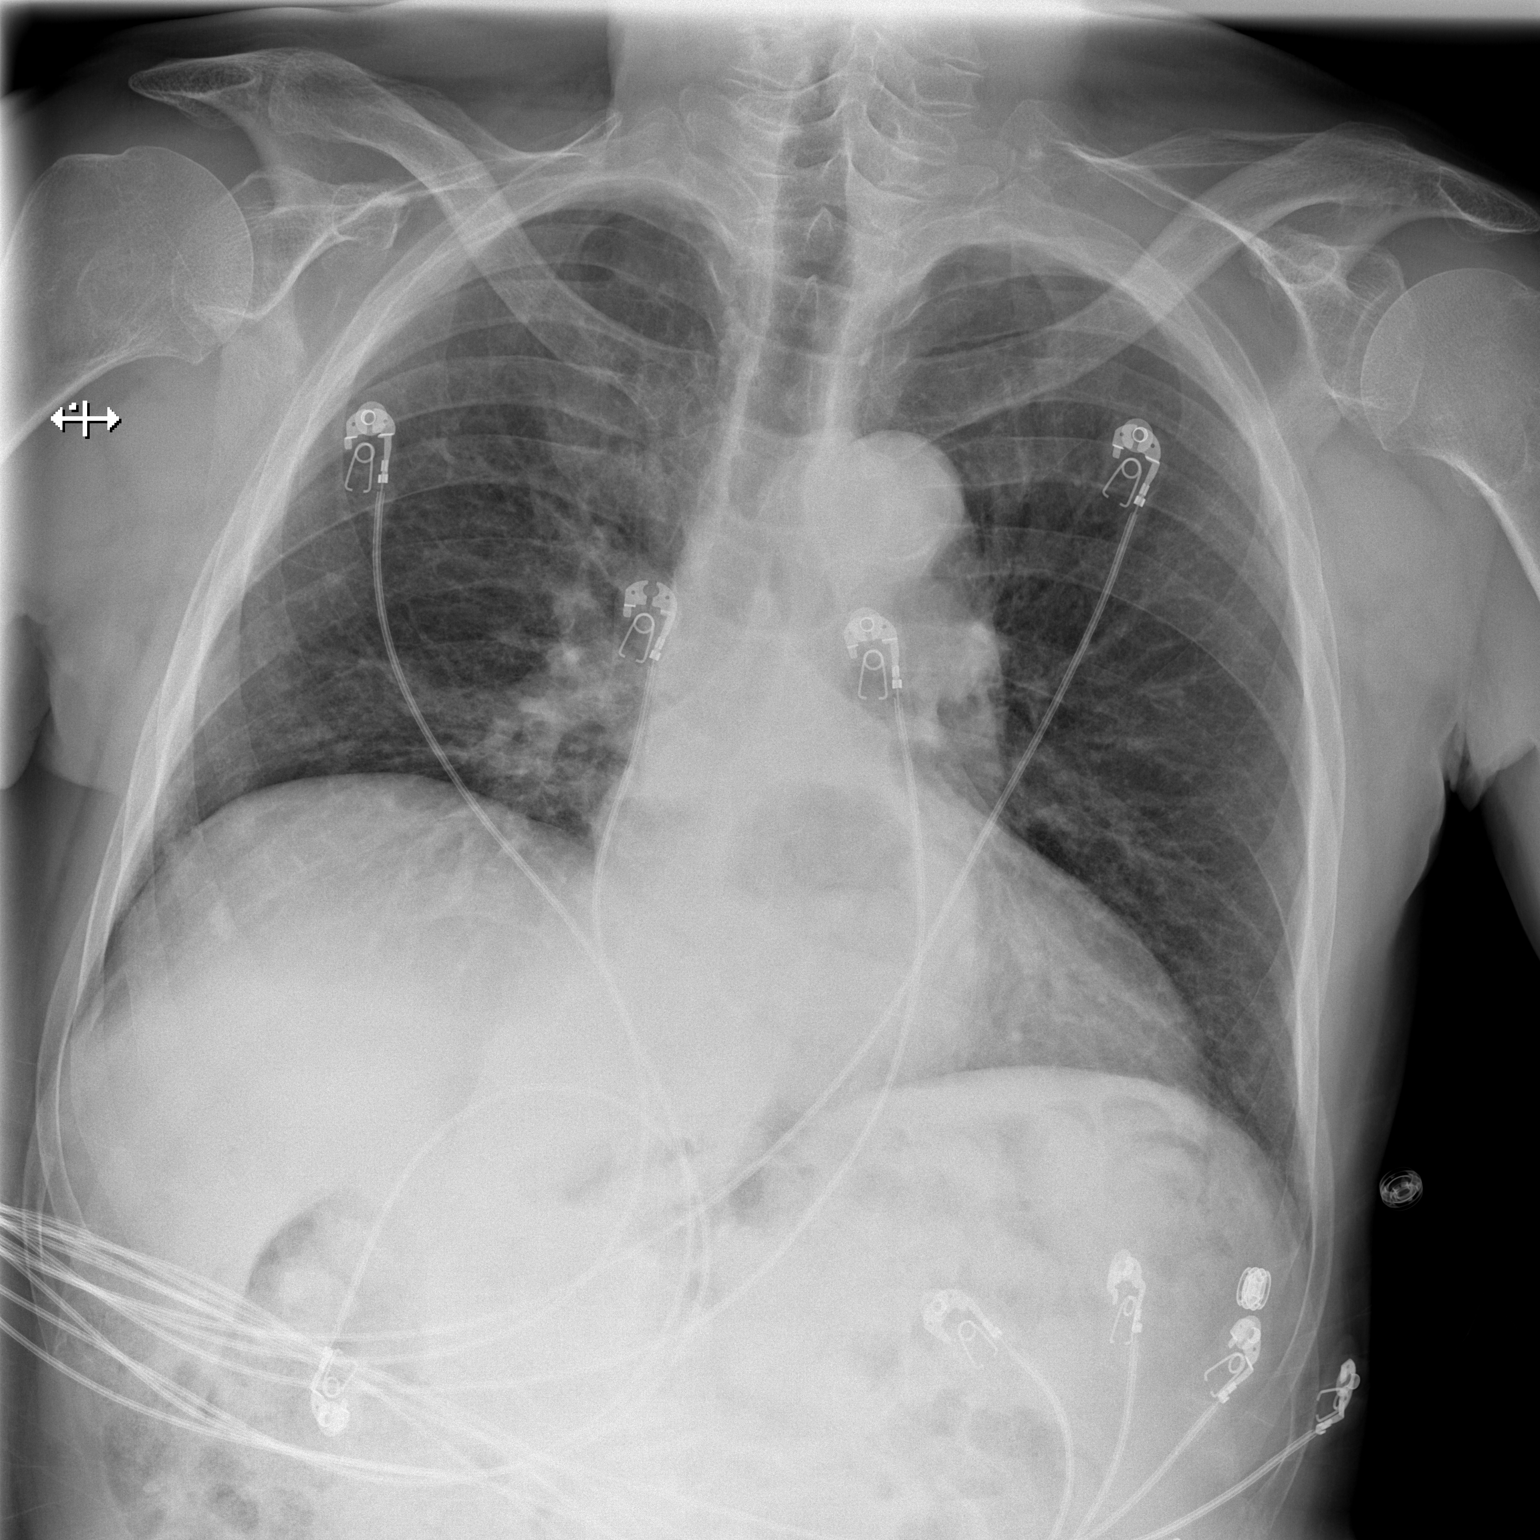

[w chest lat]
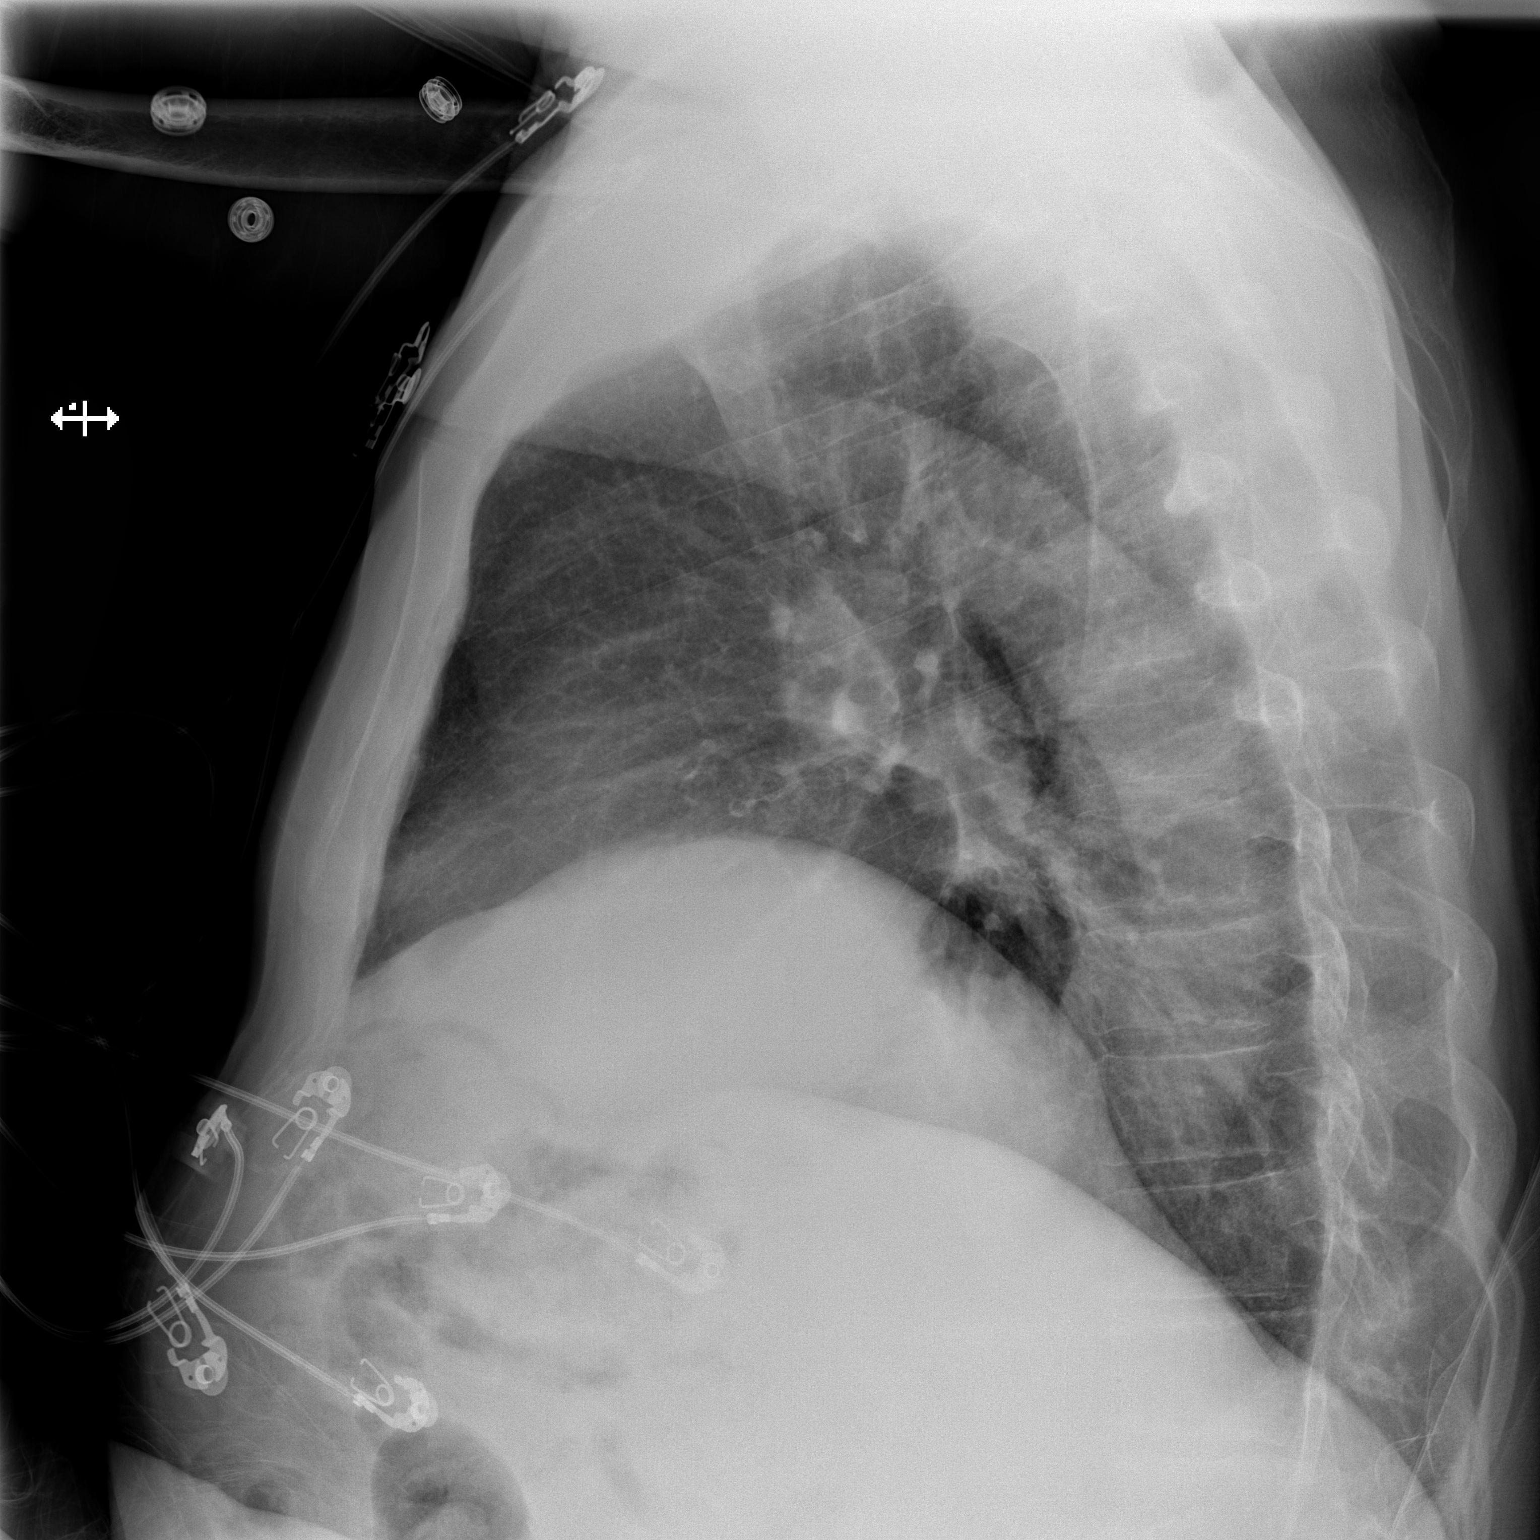

[2 of 2 positions shown; findings below may reference images not displayed]

FINDINGS: Large hiatal hernia. Heart is borderline in size. No confluent
airspace opacity, effusion or edema. No acute bony abnormality.
IMPRESSION: Large hiatal hernia.  Borderline heart size.  No active disease.

## 2018-09-24 IMAGING — CT CT ABD-PELV W/ CM
2 of 5 series · 16 of 46 positions shown, 18 images · IV contrast (APPLIED)
Comparison: None.

CLINICAL DATA: Abdominal pain

EXAM:
CT ABDOMEN AND PELVIS WITH CONTRAST
TECHNIQUE: Multidetector CT imaging of the abdomen and pelvis was performed
using the standard protocol following bolus administration of
intravenous contrast.
CONTRAST:  100mL OMNIPAQUE 300

[Series 3: abd/ pelvis 5.0 i30f 2 · axial · 0.82mm/px · z∈[-931,-481]mm · 13 of 101 slices shown, 15 images]
[im 6/101  soft-tissue]
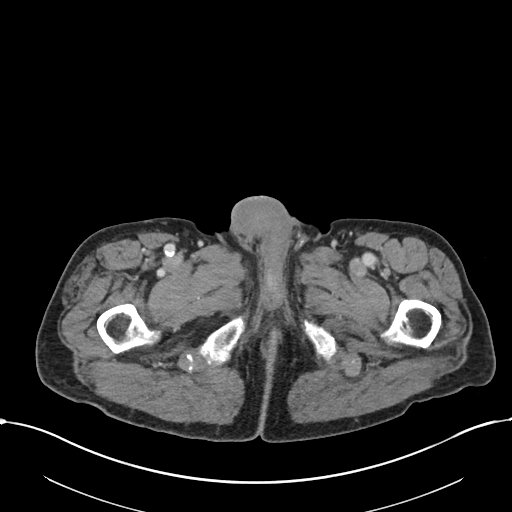
[im 6/101  bone]
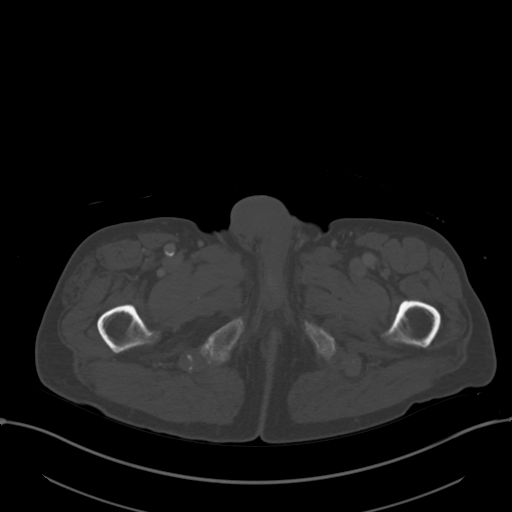
[im 16/101  soft-tissue]
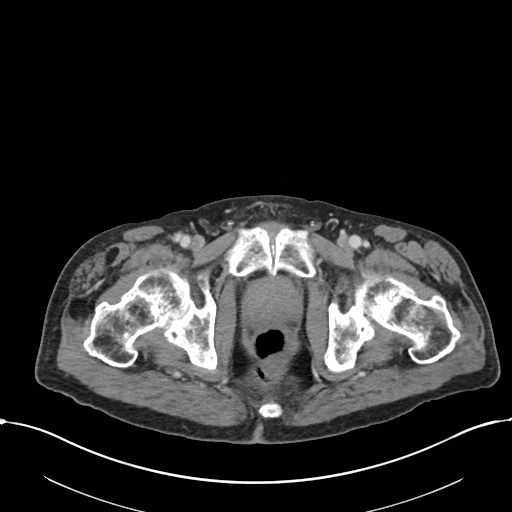
[im 21/101  soft-tissue]
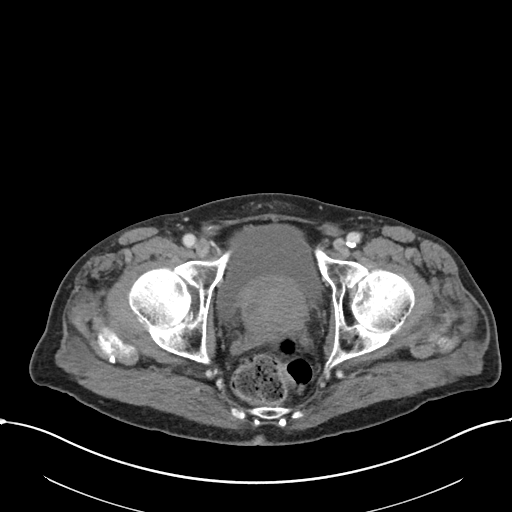
[im 31/101  soft-tissue]
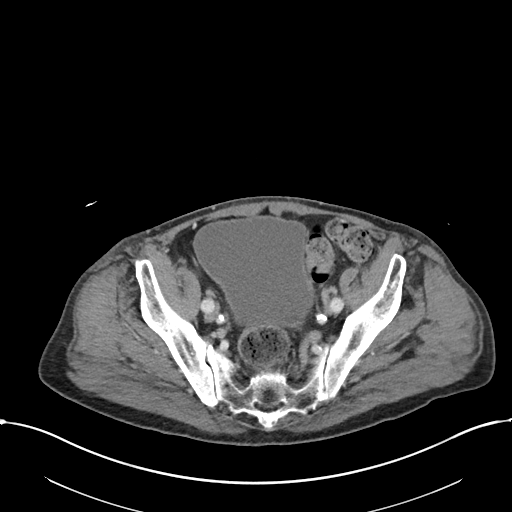
[im 36/101  soft-tissue]
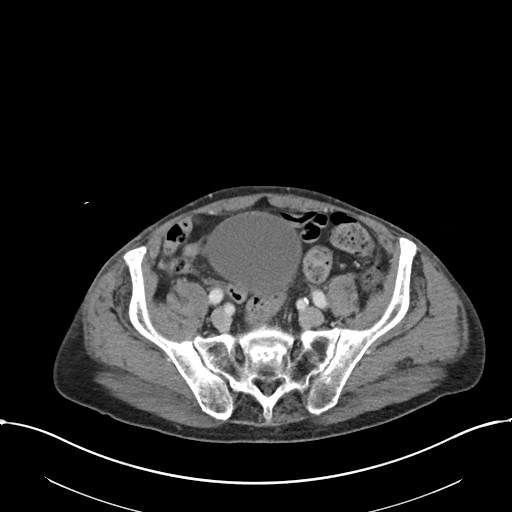
[im 46/101  soft-tissue]
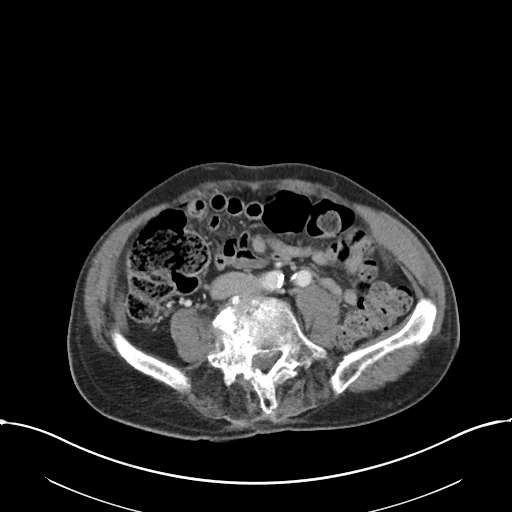
[im 51/101  soft-tissue]
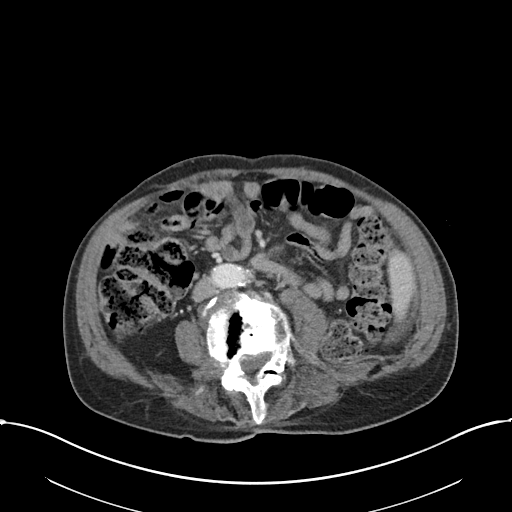
[im 56/101  soft-tissue]
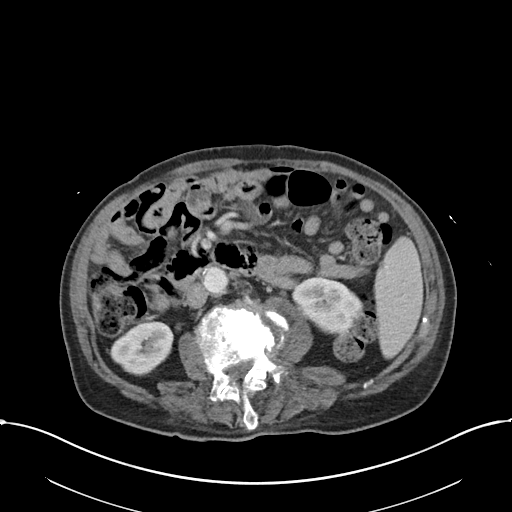
[im 66/101  soft-tissue]
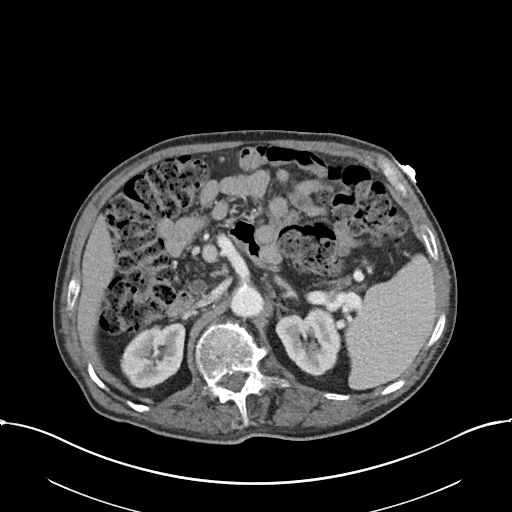
[im 66/101  bone]
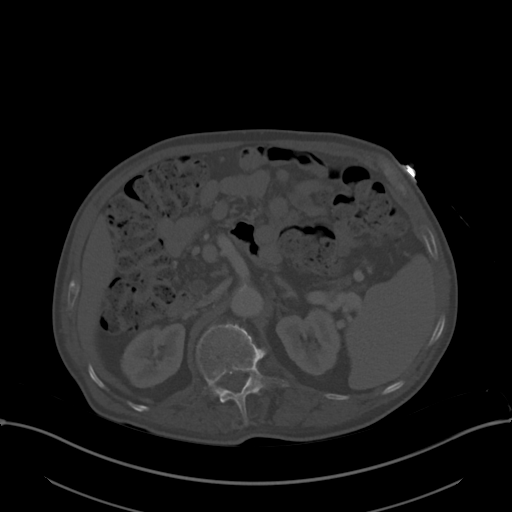
[im 71/101  soft-tissue]
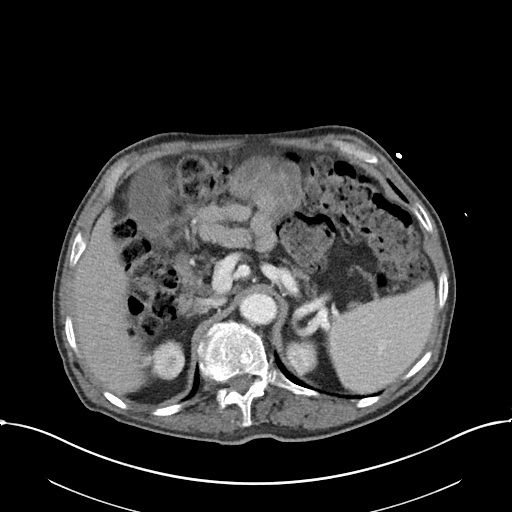
[im 81/101  soft-tissue]
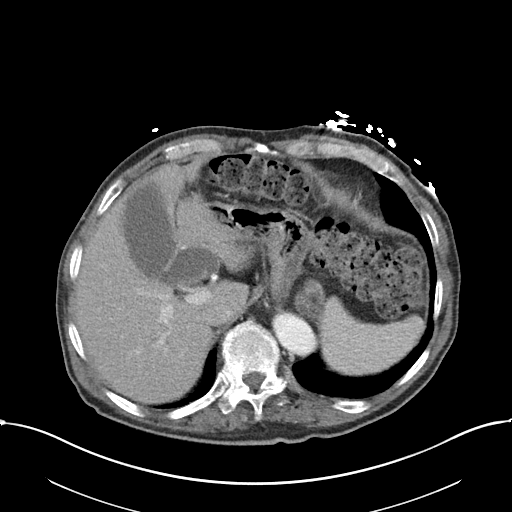
[im 86/101  soft-tissue]
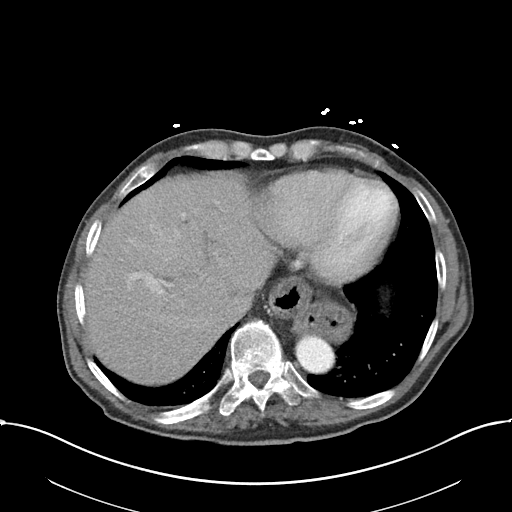
[im 96/101  soft-tissue]
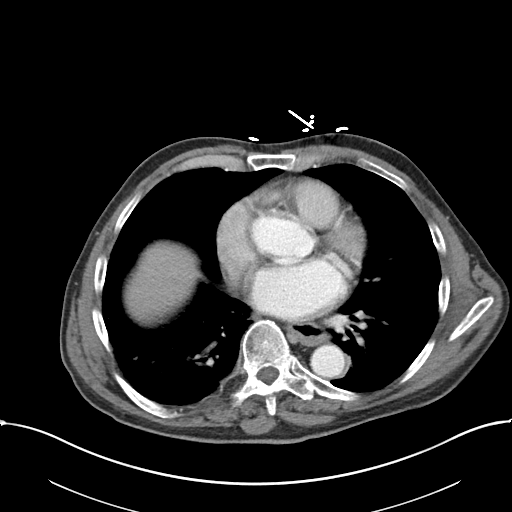

[Series 6: coronal soft tissue · coronal · 0.83mm/px · 3 of 100 slices shown]
[im 34/100  soft-tissue]
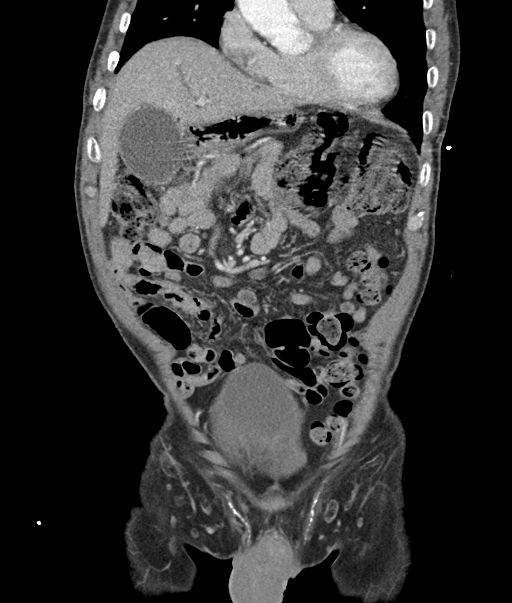
[im 45/100  soft-tissue]
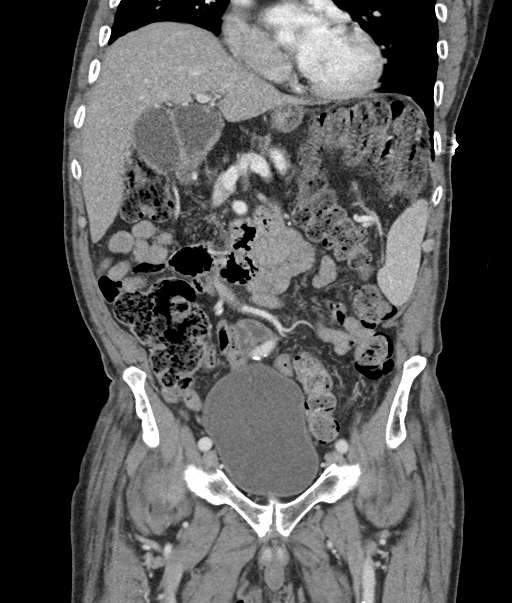
[im 56/100  soft-tissue]
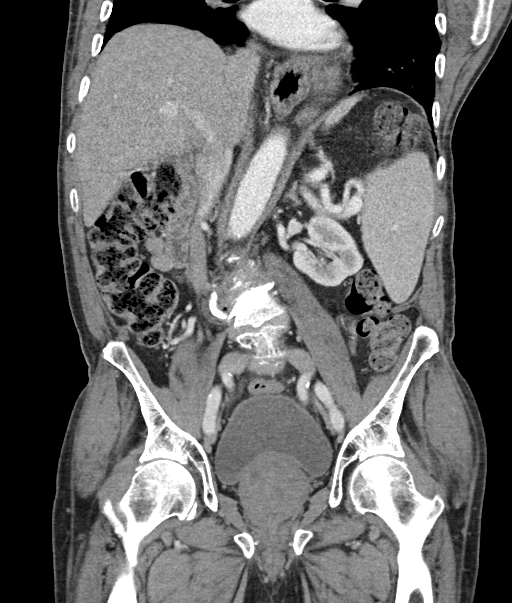

[16 of 46 positions shown; findings below may reference images not displayed]

FINDINGS: Lower chest: Lung bases are free of acute infiltrate or sizable
effusion. Coronary calcifications are identified. Moderate sized
hiatal hernia is noted.

Hepatobiliary: Gallbladder is well distended. The liver is within
normal limits. No definitive common bile duct stone is seen.

Pancreas: The pancreas is predominately atrophic.

Spleen: Spleen is within normal limits.

Adrenals/Urinary Tract: Adrenal glands are unremarkable. Kidneys
demonstrate a normal enhancement pattern bilaterally. Normal
excretion of contrast is seen. Bladder is well distended.

Stomach/Bowel: Fecal material is noted throughout the colon
consistent with mild constipation. The appendix is within normal
limits. No small bowel inflammatory changes are seen. The stomach
demonstrates a hiatal hernia.

Vascular/Lymphatic: Aortic atherosclerosis. No enlarged abdominal or
pelvic lymph nodes.

Reproductive: Prostate is unremarkable.

Other: No abdominal wall hernia or abnormality. No abdominopelvic
ascites.

Musculoskeletal: Degenerative changes of the lumbar spine are noted.
No acute compression deformity is seen. Scoliosis concave to the
left is noted in the mid lumbar spine.
IMPRESSION: Changes of constipation.  No other focal abnormality is noted

## 2018-09-24 MED ORDER — DIPHENHYDRAMINE HCL 50 MG/ML IJ SOLN
12.5000 mg | Freq: Once | INTRAMUSCULAR | Status: AC
  Administered 2018-09-24: 12.5 mg via INTRAVENOUS
  Filled 2018-09-24: qty 1

## 2018-09-24 MED ORDER — PROCHLORPERAZINE EDISYLATE 10 MG/2ML IJ SOLN
5.0000 mg | Freq: Once | INTRAMUSCULAR | Status: AC
  Administered 2018-09-24: 5 mg via INTRAVENOUS
  Filled 2018-09-24: qty 2

## 2018-09-24 MED ORDER — SODIUM CHLORIDE 0.9 % IV BOLUS
1000.0000 mL | Freq: Once | INTRAVENOUS | Status: AC
  Administered 2018-09-24: 1000 mL via INTRAVENOUS

## 2018-09-24 MED ORDER — IOHEXOL 300 MG/ML  SOLN
100.0000 mL | Freq: Once | INTRAMUSCULAR | Status: AC | PRN
  Administered 2018-09-24: 100 mL via INTRAVENOUS

## 2018-09-24 MED ORDER — ONDANSETRON HCL 4 MG/2ML IJ SOLN
4.0000 mg | Freq: Once | INTRAMUSCULAR | Status: AC
  Administered 2018-09-24: 4 mg via INTRAVENOUS
  Filled 2018-09-24: qty 2

## 2018-09-24 MED ORDER — MORPHINE SULFATE (PF) 4 MG/ML IV SOLN
4.0000 mg | Freq: Once | INTRAVENOUS | Status: AC
  Administered 2018-09-24: 4 mg via INTRAVENOUS
  Filled 2018-09-24: qty 1

## 2018-09-24 MED ORDER — ONDANSETRON HCL 4 MG/2ML IJ SOLN: 4.0000 mg | Freq: Once | INTRAMUSCULAR | Status: DC

## 2018-09-24 NOTE — ED Provider Notes (Signed)
[facility] EMERGENCY DEPARTMENT Provider Note   CSN: [id number] Arrival date & time: 09/24/18  2025     History   Chief Complaint Chief Complaint  Patient presents with  . Chest Pain    HPI Riley Eaton is a 78 y.o. male.  78 yo M with a chief complaint chest pain.  This is left-sided difficult for him to describe.  Pain seems to come and go.  Denies radiation.  Has had some low back pain that is chronic for him and unchanged.  Denies fevers or chills has had some nausea and vomiting consistently throughout the day has not been able to keep anything down.  Normally is on chronic narcotics for his low back and thinks that is hurting him more than normal because he has not had his home pain medicine.  Denies history of MI, denies hypertension hyperlipidemia or diabetes.  Denies family history of MI.  Denies smoking.  He denies history of PE or DVT denies unilateral lower extremity edema denies recent travel or immobilization.  He denies recent surgery.  Denies prior abdominal surgery.  The history is provided by the patient.  Chest Pain  Associated symptoms: no abdominal pain, no fever, no headache, no palpitations, no shortness of breath and no vomiting   Illness  Severity:  Mild Onset quality:  Sudden Timing:  Constant Progression:  Worsening Chronicity:  New Associated symptoms: chest pain   Associated symptoms: no abdominal pain, no congestion, no diarrhea, no fever, no headaches, no myalgias, no rash, no shortness of breath and no vomiting     Past Medical History:  Diagnosis Date  . Chronic back pain   . 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 Coronary artery calcification seen on CT scan   . Nerve pain   . Thoracic aortic aneurysm [facility])     Patient Active Problem List   Diagnosis Date Noted  . 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 Thoracic aortic aneurysm ([place]) 10/03/2015  . Coronary artery calcification seen on CT scan 10/03/2015    Past  Surgical History:  Procedure Laterality Date  . BACK SURGERY     09-2016  . back surgey          Home Medications    Prior to Admission medications   Medication Sig Start Date End Date Taking? Authorizing Provider  allopurinol (ZYLOPRIM) 100 MG tablet TAKE 1 TABLET BY MOUTH EVERY DAY Patient taking differently: Take 100 mg by mouth daily.  09/15/18  Yes Kathrynn Ducking, MD  bisacodyl (DULCOLAX) 10 MG suppository Place 1 suppository (10 mg total) rectally as needed for moderate constipation. 10/26/17  Yes Varney Biles, MD  calcium-vitamin D (OSCAL WITH D) 500-200 MG-UNIT tablet Take 1 tablet by mouth daily with breakfast.   Yes [provider]  Diphenhydramine-APAP, sleep, (TYLENOL PM EXTRA STRENGTH PO) Take 1 tablet by mouth at bedtime.    Yes [provider]  DULoxetine (CYMBALTA) 60 MG capsule TAKE 1 CAPSULE BY MOUTH TWICE A DAY Patient taking differently: Take 60 mg by mouth 2 (two) times daily.  05/05/18  Yes Kathrynn Ducking, MD  HYDROcodone-acetaminophen (NORCO/VICODIN) 5-325 MG tablet Take 1 tablet by mouth every 6 (six) hours as needed for moderate pain. Must last 28 days. 09/21/18  Yes Kathrynn Ducking, MD  oxyCODONE (OXYCONTIN) 80 mg 12 hr tablet Take 1 tablet (80 mg total) by mouth every 12 (twelve) hours. 09/21/18  Yes Kathrynn Ducking, MD  pregabalin (LYRICA) 200  MG capsule TAKE 1 CAPSULE BY MOUTH THREE TIMES A DAY Patient taking differently: Take 200 mg by mouth 3 (three) times daily.  09/23/18  Yes Kathrynn Ducking, MD  promethazine (PHENERGAN) 25 MG tablet Take 1 tablet by mouth every 4 (four) hours as needed for nausea or vomiting.  08/02/15  Yes [provider]  tamsulosin (FLOMAX) 0.4 MG CAPS capsule Take 0.4 mg by mouth daily.  08/29/15  Yes [provider]  traZODone (DESYREL) 50 MG tablet Take 25 mg by mouth at bedtime.    Yes [provider]  triamcinolone ointment (KENALOG) 0.1 % Apply 1 application topically 2 (two)  times daily as needed for rash. 10/15/17  Yes [provider]  doxycycline (VIBRAMYCIN) 100 MG capsule Take 1 capsule (100 mg total) by mouth 2 (two) times daily. Patient not taking: Reported on 09/24/2018 10/26/17   Varney Biles, MD    Family History Family History  Problem Relation Age of Onset  . Cancer Mother   . Heart attack Father     Social History Social History   Tobacco Use  . Smoking status: Never Smoker  . Smokeless tobacco: Never Used  Substance Use Topics  . Alcohol use: No  . Drug use: No     Allergies   Patient has no known allergies.   Review of Systems Review of Systems  Constitutional: Negative for chills and fever.  HENT: Negative for congestion and facial swelling.   Eyes: Negative for discharge and visual disturbance.  Respiratory: Negative for shortness of breath.   Cardiovascular: Positive for chest pain. Negative for palpitations.  Gastrointestinal: Negative for abdominal pain, diarrhea and vomiting.  Musculoskeletal: Negative for arthralgias and myalgias.  Skin: Negative for color change and rash.  Neurological: Negative for tremors, syncope and headaches.  Psychiatric/Behavioral: Negative for confusion and dysphoric mood.     Physical Exam Updated Vital Signs BP (!) 169/95   Pulse 73   Temp 98.2 F (36.8 C) (Oral)   Resp 16   SpO2 97%   Physical Exam Vitals signs and nursing note reviewed.  Constitutional:      Appearance: He is well-developed.  HENT:     Head: Normocephalic and atraumatic.  Eyes:     Pupils: Pupils are equal, round, and reactive to light.  Neck:     Musculoskeletal: Normal range of motion and neck supple.     Vascular: No JVD.  Cardiovascular:     Rate and Rhythm: Normal rate and regular rhythm.     Heart sounds: No murmur. No friction rub. No gallop.   Pulmonary:     Effort: No respiratory distress.     Breath sounds: No wheezing.  Abdominal:     General: There is no distension.      Tenderness: There is no guarding or rebound.  Musculoskeletal: Normal range of motion.  Skin:    Coloration: Skin is not pale.     Findings: No rash.  Neurological:     Mental Status: He is alert and oriented to person, place, and time.  Psychiatric:        Behavior: Behavior normal.      ED Treatments / Results  Labs (all labs ordered are listed, but only abnormal results are displayed) Labs Reviewed  BASIC METABOLIC PANEL - Abnormal; Notable for the following components:      Result Value   CO2 21 (*)    Glucose, Bld 165 (*)    All other components within normal limits  CBC - Abnormal; Notable for the following components:   HCT 38.2 (*)    All other components within normal limits  HEPATIC FUNCTION PANEL - Abnormal; Notable for the following components:   Total Bilirubin 1.4 (*)    Bilirubin, Direct 0.4 (*)    Indirect Bilirubin 1.0 (*)    All other components within normal limits  I-STAT CREATININE, ED - Abnormal; Notable for the following components:   Creatinine, Ser 0.60 (*)    All other components within normal limits  LIPASE, BLOOD  D-DIMER, QUANTITATIVE (NOT AT [facility])  LACTIC ACID, PLASMA  LACTIC ACID, PLASMA  I-STAT TROPONIN, ED  I-STAT TROPONIN, ED    EKG EKG Interpretation  Date/Time:  Thursday September 24 2018 53:97:67 EST Ventricular Rate:  76 PR Interval:    QRS Duration: 116 QT Interval:  398 QTC Calculation: 448 R Axis:   64 Text Interpretation:  Sinus rhythm Probable left atrial enlargement Nonspecific intraventricular conduction delay No significant change since last tracing Confirmed by Deno Etienne [id number]) on 09/24/2018 8:35:11 PM   Radiology Dg Chest 2 View  Result Date: 09/24/2018 CLINICAL DATA:  Chest pain.  Nausea, vomiting. EXAM: CHEST - 2 VIEW COMPARISON:  10/26/2017 FINDINGS: Large hiatal hernia. Heart is borderline in size. No confluent airspace opacity, effusion or edema. No acute bony abnormality. IMPRESSION: Large hiatal hernia.   Borderline heart size.  No active disease. Electronically Signed   By: Rolm Baptise M.D.   On: 09/24/2018 21:17   Ct Abdomen Pelvis W Contrast  Result Date: 09/24/2018 CLINICAL DATA:  Abdominal pain EXAM: CT ABDOMEN AND PELVIS WITH CONTRAST TECHNIQUE: Multidetector CT imaging of the abdomen and pelvis was performed using the standard protocol following bolus administration of intravenous contrast. CONTRAST:  184mL OMNIPAQUE 300 COMPARISON:  None. FINDINGS: Lower chest: Lung bases are free of acute infiltrate or sizable effusion. Coronary calcifications are identified. Moderate sized hiatal hernia is noted. Hepatobiliary: Gallbladder is well distended. The liver is within normal limits. No definitive common bile duct stone is seen. Pancreas: The pancreas is predominately atrophic. Spleen: Spleen is within normal limits. Adrenals/Urinary Tract: Adrenal glands are unremarkable. Kidneys demonstrate a normal enhancement pattern bilaterally. Normal excretion of contrast is seen. Bladder is well distended. Stomach/Bowel: Fecal material is noted throughout the colon consistent with mild constipation. The appendix is within normal limits. No small bowel inflammatory changes are seen. The stomach demonstrates a hiatal hernia. Vascular/Lymphatic: Aortic atherosclerosis. No enlarged abdominal or pelvic lymph nodes. Reproductive: Prostate is unremarkable. Other: No abdominal wall hernia or abnormality. No abdominopelvic ascites. Musculoskeletal: Degenerative changes of the lumbar spine are noted. No acute compression deformity is seen. Scoliosis concave to the left is noted in the mid lumbar spine. IMPRESSION: Changes of constipation.  No other focal abnormality is noted Electronically Signed   By: Inez Catalina M.D.   On: 09/24/2018 22:48    Procedures Procedures (including critical care time)  Medications Ordered in ED Medications  diphenhydrAMINE (BENADRYL) injection 25 mg (has no administration in time range)    sodium chloride 0.9 % bolus 1,000 mL (0 mLs Intravenous Stopped 09/24/18 2200)  morphine 4 MG/ML injection 4 mg (4 mg Intravenous Given 09/24/18 2122)  ondansetron (ZOFRAN) injection 4 mg (4 mg Intravenous Given 09/24/18 2122)  iohexol (OMNIPAQUE) 300 MG/ML solution 100 mL (100 mLs Intravenous Contrast Given 09/24/18 2216)  prochlorperazine (COMPAZINE) injection 5 mg (5 mg Intravenous Given 09/24/18 2326)  diphenhydrAMINE (BENADRYL) injection 12.5 mg (12.5 mg Intravenous Given 09/24/18 2338)  morphine 4  MG/ML injection 4 mg (4 mg Intravenous Given 09/24/18 2341)     Initial Impression / Assessment and Plan / ED Course  I have reviewed the triage vital signs and the nursing notes.  Pertinent labs & imaging results that were available during my care of the patient were reviewed by me and considered in my medical decision making (see chart for details).     78 yo M with a chief complaint of nausea and vomiting.  Not having chest pain.  Also has chronic low back pain and thinks is worse because he is not been able to take his narcotics.  Sounds atypical from ACS, patient denies any cardiac risk factors other than his age.  Will obtain troponin, EKG is unchanged.  Patient has no pulmonary embolism risk factors but his oxygen saturation is somewhat low on room air.  Will obtain lab work.  He also is having diffuse abdominal guarding without focal pain.  Will likely CT.  CT scan of the abdomen pelvis is negative for acute intra-abdominal pathology.  It was noted that the gallbladder was significantly distended.  His LFTs are unremarkable.  He had a negative d-dimer.  Initial troponin was negative.  On my repeat exam the patient stated that he was feeling better and would like to try to eat.  He is able to take a sip of water and became nauseated again.  We will give another dose of nausea medicine.  With ongoing symptoms will obtain a right upper quadrant ultrasound.  The patient is become a bit more  uncomfortable, he is having trouble describing exactly what he is feeling.  He was given Compazine and so I wonder if some of this is a akathisia.  He was given some Benadryl initially though I given the lower dose based on his age, will give him 25 mg now.  We will repeat a EKG.  Will obtain a lactic acid.  I discussed the case with Dr. Nicholes Stairs, please see her note for further details care in the emergency department.  The patients results and plan were reviewed and discussed.   Any x-rays performed were independently reviewed by myself.   Differential diagnosis were considered with the presenting HPI.  Medications  diphenhydrAMINE (BENADRYL) injection 25 mg (has no administration in time range)  sodium chloride 0.9 % bolus 1,000 mL (0 mLs Intravenous Stopped 09/24/18 2200)  morphine 4 MG/ML injection 4 mg (4 mg Intravenous Given 09/24/18 2122)  ondansetron (ZOFRAN) injection 4 mg (4 mg Intravenous Given 09/24/18 2122)  iohexol (OMNIPAQUE) 300 MG/ML solution 100 mL (100 mLs Intravenous Contrast Given 09/24/18 2216)  prochlorperazine (COMPAZINE) injection 5 mg (5 mg Intravenous Given 09/24/18 2326)  diphenhydrAMINE (BENADRYL) injection 12.5 mg (12.5 mg Intravenous Given 09/24/18 2338)  morphine 4 MG/ML injection 4 mg (4 mg Intravenous Given 09/24/18 2341)    Vitals:   09/24/18 2036 09/24/18 2145 09/24/18 2200 09/24/18 2300  BP:  (!) 158/93 (!) 165/88 (!) 169/95  Pulse:  70 67 73  Resp:  (!) 25 15 16   Temp: 98.2 F (36.8 C)     TempSrc: Oral     SpO2:  94% 95% 97%    Final diagnoses:  Abdominal pain, RUQ  Intractable nausea and vomiting  Generalized abdominal pain  Nonspecific chest pain    Admission/ observation were discussed with the admitting physician, patient and/or family and they are comfortable with the plan.    Final Clinical Impressions(s) / ED Diagnoses   Final diagnoses:  Abdominal  pain, RUQ  Intractable nausea and vomiting  Generalized abdominal pain  Nonspecific  chest pain    ED Discharge Orders    None       Deno Etienne, DO 09/25/18 0010

## 2018-09-24 NOTE — ED Notes (Signed)
ED Provider at bedside. 

## 2018-09-24 NOTE — ED Triage Notes (Signed)
Pt arrives to ED from home with complaints of sharp right, left, and midsternal chest pain since 1900 tonight. EMS reports pt was complaining of 10/10 chest pain. EMS gave pt 324 aspirin and 1 nitro. Pt currently free from pain. Pt has no complaints and states he feels back to normal. Pt placed in position of comfort with bed locked and lowered, call bell in reach.

## 2018-09-25 ENCOUNTER — Encounter (HOSPITAL_COMMUNITY): Payer: Self-pay | Admitting: Internal Medicine

## 2018-09-25 ENCOUNTER — Observation Stay (HOSPITAL_COMMUNITY): Payer: Medicare Other

## 2018-09-25 ENCOUNTER — Encounter (HOSPITAL_COMMUNITY)
Admission: EM | Disposition: A | Discharge status: Skilled Nursing Facility | Payer: Self-pay | Source: Home / Self Care | Attending: Internal Medicine

## 2018-09-25 ENCOUNTER — Inpatient Hospital Stay (HOSPITAL_COMMUNITY): Payer: Medicare Other | Admitting: Certified Registered"

## 2018-09-25 ENCOUNTER — Inpatient Hospital Stay (HOSPITAL_COMMUNITY): Payer: Medicare Other

## 2018-09-25 ENCOUNTER — Other Ambulatory Visit (HOSPITAL_COMMUNITY): Payer: Medicare Other

## 2018-09-25 ENCOUNTER — Emergency Department (HOSPITAL_COMMUNITY): Payer: Medicare Other

## 2018-09-25 DIAGNOSIS — I10 Essential (primary) hypertension: Secondary | ICD-10-CM | POA: Diagnosis present

## 2018-09-25 DIAGNOSIS — K5900 Constipation, unspecified: Secondary | ICD-10-CM | POA: Diagnosis present

## 2018-09-25 DIAGNOSIS — G8929 Other chronic pain: Secondary | ICD-10-CM | POA: Diagnosis present

## 2018-09-25 DIAGNOSIS — K8070 Calculus of gallbladder and bile duct without cholecystitis without obstruction: Secondary | ICD-10-CM | POA: Diagnosis present

## 2018-09-25 DIAGNOSIS — R338 Other retention of urine: Secondary | ICD-10-CM | POA: Diagnosis present

## 2018-09-25 DIAGNOSIS — G894 Chronic pain syndrome: Secondary | ICD-10-CM | POA: Diagnosis not present

## 2018-09-25 DIAGNOSIS — K8050 Calculus of bile duct without cholangitis or cholecystitis without obstruction: Secondary | ICD-10-CM

## 2018-09-25 DIAGNOSIS — F1123 Opioid dependence with withdrawal: Secondary | ICD-10-CM | POA: Diagnosis not present

## 2018-09-25 DIAGNOSIS — I2510 Atherosclerotic heart disease of native coronary artery without angina pectoris: Secondary | ICD-10-CM | POA: Diagnosis present

## 2018-09-25 DIAGNOSIS — E876 Hypokalemia: Secondary | ICD-10-CM | POA: Diagnosis not present

## 2018-09-25 DIAGNOSIS — Y92230 Patient room in hospital as the place of occurrence of the external cause: Secondary | ICD-10-CM | POA: Diagnosis not present

## 2018-09-25 DIAGNOSIS — Z6824 Body mass index (BMI) 24.0-24.9, adult: Secondary | ICD-10-CM | POA: Diagnosis not present

## 2018-09-25 DIAGNOSIS — G9341 Metabolic encephalopathy: Secondary | ICD-10-CM | POA: Diagnosis present

## 2018-09-25 DIAGNOSIS — R1110 Vomiting, unspecified: Secondary | ICD-10-CM | POA: Diagnosis not present

## 2018-09-25 DIAGNOSIS — R112 Nausea with vomiting, unspecified: Secondary | ICD-10-CM | POA: Diagnosis present

## 2018-09-25 DIAGNOSIS — R1310 Dysphagia, unspecified: Secondary | ICD-10-CM | POA: Diagnosis not present

## 2018-09-25 DIAGNOSIS — S3730XA Unspecified injury of urethra, initial encounter: Secondary | ICD-10-CM | POA: Diagnosis not present

## 2018-09-25 DIAGNOSIS — N401 Enlarged prostate with lower urinary tract symptoms: Secondary | ICD-10-CM | POA: Diagnosis present

## 2018-09-25 DIAGNOSIS — K8032 Calculus of bile duct with acute cholangitis without obstruction: Secondary | ICD-10-CM | POA: Diagnosis present

## 2018-09-25 DIAGNOSIS — N35912 Unspecified bulbous urethral stricture, male: Secondary | ICD-10-CM | POA: Diagnosis present

## 2018-09-25 DIAGNOSIS — E860 Dehydration: Secondary | ICD-10-CM | POA: Diagnosis not present

## 2018-09-25 DIAGNOSIS — X58XXXA Exposure to other specified factors, initial encounter: Secondary | ICD-10-CM | POA: Diagnosis not present

## 2018-09-25 DIAGNOSIS — N365 Urethral false passage: Secondary | ICD-10-CM | POA: Diagnosis present

## 2018-09-25 DIAGNOSIS — F05 Delirium due to known physiological condition: Secondary | ICD-10-CM | POA: Diagnosis not present

## 2018-09-25 DIAGNOSIS — E870 Hyperosmolality and hypernatremia: Secondary | ICD-10-CM | POA: Diagnosis not present

## 2018-09-25 DIAGNOSIS — N138 Other obstructive and reflux uropathy: Secondary | ICD-10-CM | POA: Diagnosis present

## 2018-09-25 DIAGNOSIS — I712 Thoracic aortic aneurysm, without rupture: Secondary | ICD-10-CM | POA: Diagnosis present

## 2018-09-25 DIAGNOSIS — R1084 Generalized abdominal pain: Secondary | ICD-10-CM | POA: Diagnosis present

## 2018-09-25 DIAGNOSIS — E871 Hypo-osmolality and hyponatremia: Secondary | ICD-10-CM | POA: Diagnosis not present

## 2018-09-25 DIAGNOSIS — Z23 Encounter for immunization: Secondary | ICD-10-CM | POA: Diagnosis present

## 2018-09-25 DIAGNOSIS — E43 Unspecified severe protein-calorie malnutrition: Secondary | ICD-10-CM | POA: Diagnosis not present

## 2018-09-25 DIAGNOSIS — M545 Low back pain: Secondary | ICD-10-CM | POA: Diagnosis present

## 2018-09-25 DIAGNOSIS — R4182 Altered mental status, unspecified: Secondary | ICD-10-CM | POA: Diagnosis not present

## 2018-09-25 DIAGNOSIS — R339 Retention of urine, unspecified: Secondary | ICD-10-CM | POA: Diagnosis present

## 2018-09-25 HISTORY — PX: ENDOSCOPIC RETROGRADE CHOLANGIOPANCREATOGRAPHY (ERCP) WITH PROPOFOL: SHX5810

## 2018-09-25 HISTORY — PX: SPHINCTEROTOMY: SHX5279

## 2018-09-25 HISTORY — PX: REMOVAL OF STONES: SHX5545

## 2018-09-25 HISTORY — PX: PANCREATIC STENT PLACEMENT: SHX5539

## 2018-09-25 LAB — CBC WITH DIFFERENTIAL/PLATELET
Abs Immature Granulocytes: 0.12 10*3/uL — ABNORMAL HIGH (ref 0.00–0.07)
Basophils Absolute: 0 10*3/uL (ref 0.0–0.1)
Basophils Relative: 0 %
Eosinophils Absolute: 0 10*3/uL (ref 0.0–0.5)
Eosinophils Relative: 0 %
HCT: 38.6 % — ABNORMAL LOW (ref 39.0–52.0)
Hemoglobin: 13.6 g/dL (ref 13.0–17.0)
Immature Granulocytes: 1 %
Lymphocytes Relative: 8 %
Lymphs Abs: 0.9 10*3/uL (ref 0.7–4.0)
MCH: 31.6 pg (ref 26.0–34.0)
MCHC: 35.2 g/dL (ref 30.0–36.0)
MCV: 89.8 fL (ref 80.0–100.0)
Monocytes Absolute: 0.9 10*3/uL (ref 0.1–1.0)
Monocytes Relative: 8 %
Neutro Abs: 9.5 10*3/uL — ABNORMAL HIGH (ref 1.7–7.7)
Neutrophils Relative %: 83 %
Platelets: 142 10*3/uL — ABNORMAL LOW (ref 150–400)
RBC: 4.3 MIL/uL (ref 4.22–5.81)
RDW: 13.2 % (ref 11.5–15.5)
WBC: 11.5 10*3/uL — ABNORMAL HIGH (ref 4.0–10.5)
nRBC: 0 % (ref 0.0–0.2)

## 2018-09-25 LAB — URINALYSIS, ROUTINE W REFLEX MICROSCOPIC
Bacteria, UA: NONE SEEN
Bilirubin Urine: NEGATIVE
Glucose, UA: NEGATIVE mg/dL
Ketones, ur: 5 mg/dL — AB
Leukocytes,Ua: NEGATIVE
Nitrite: NEGATIVE
Protein, ur: NEGATIVE mg/dL
Specific Gravity, Urine: >1.046 — ABNORMAL HIGH (ref 1.005–1.030)
pH: 6 (ref 5.0–8.0)

## 2018-09-25 LAB — I-STAT TROPONIN, ED: Troponin i, poc: 0.04 ng/mL (ref 0.00–0.08)

## 2018-09-25 LAB — TROPONIN I: Troponin I: 0.04 ng/mL (ref <0.03)

## 2018-09-25 LAB — HEPATIC FUNCTION PANEL
ALT: 17 U/L (ref 0–44)
AST: 34 U/L (ref 15–41)
Albumin: 4.2 g/dL (ref 3.5–5.0)
Alkaline Phosphatase: 94 U/L (ref 38–126)
Bilirubin, Direct: 0.4 mg/dL — ABNORMAL HIGH (ref 0.0–0.2)
Indirect Bilirubin: 1.4 mg/dL — ABNORMAL HIGH (ref 0.3–0.9)
Total Bilirubin: 1.8 mg/dL — ABNORMAL HIGH (ref 0.3–1.2)
Total Protein: 7 g/dL (ref 6.5–8.1)

## 2018-09-25 LAB — BASIC METABOLIC PANEL
Anion gap: 15 (ref 5–15)
BUN: 18 mg/dL (ref 8–23)
CO2: 17 mmol/L — ABNORMAL LOW (ref 22–32)
Calcium: 9.6 mg/dL (ref 8.9–10.3)
Chloride: 109 mmol/L (ref 98–111)
Creatinine, Ser: 1.13 mg/dL (ref 0.61–1.24)
GFR calc Af Amer: >60 mL/min (ref >60)
GFR calc non Af Amer: >60 mL/min (ref >60)
Glucose, Bld: 131 mg/dL — ABNORMAL HIGH (ref 70–99)
Potassium: 3.4 mmol/L — ABNORMAL LOW (ref 3.5–5.1)
Sodium: 141 mmol/L (ref 135–145)

## 2018-09-25 LAB — MAGNESIUM: Magnesium: 1.8 mg/dL (ref 1.7–2.4)

## 2018-09-25 LAB — LACTIC ACID, PLASMA: Lactic Acid, Venous: 1.4 mmol/L (ref 0.5–1.9)

## 2018-09-25 IMAGING — RF DG ERCP WO/W SPHINCTEROTOMY
1 series · 12 of 12 positions shown · non-contrast
Comparison: MRCP-earlier same day

FLUOROSCOPY TIME:  5 minutes, 10 seconds (48 mGy)

CLINICAL DATA: Choledocholithiasis.

EXAM:
ERCP
TECHNIQUE: Multiple spot images obtained with the fluoroscopic device and
submitted for interpretation post-procedure.

[Series 1: unknown protocol · 0.20mm/px · 12 of 12 slices shown]
[im 1/12]
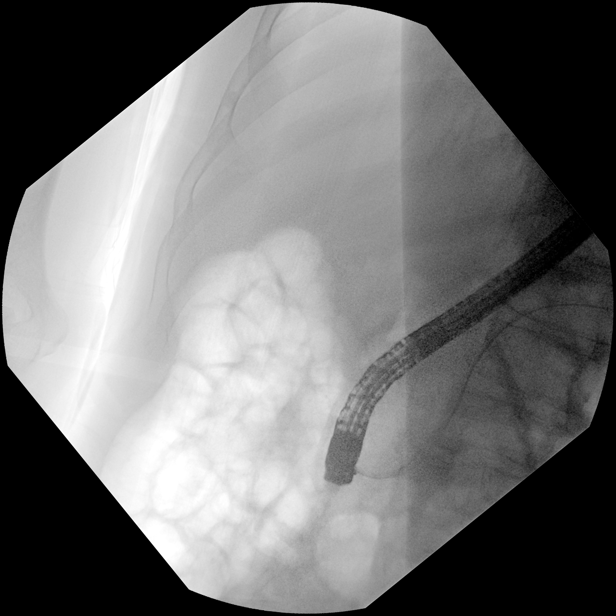
[im 2/12]
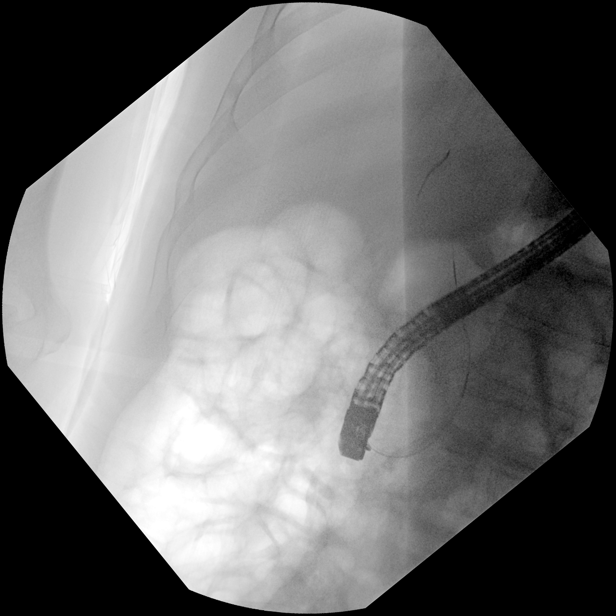
[im 3/12]
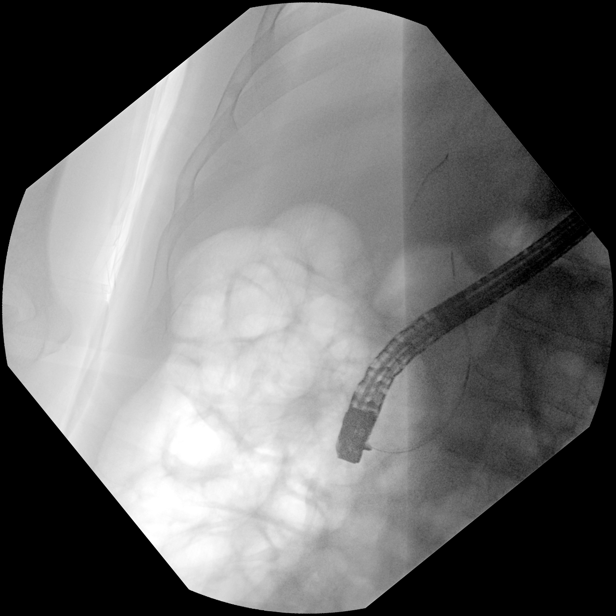
[im 4/12]
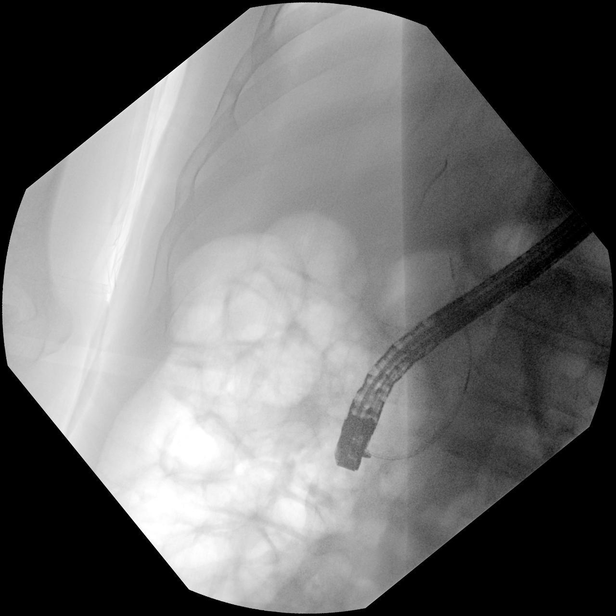
[im 5/12]
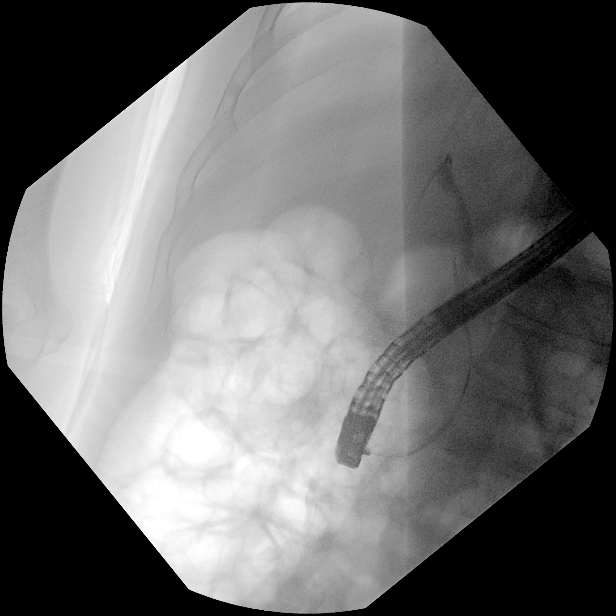
[im 6/12]
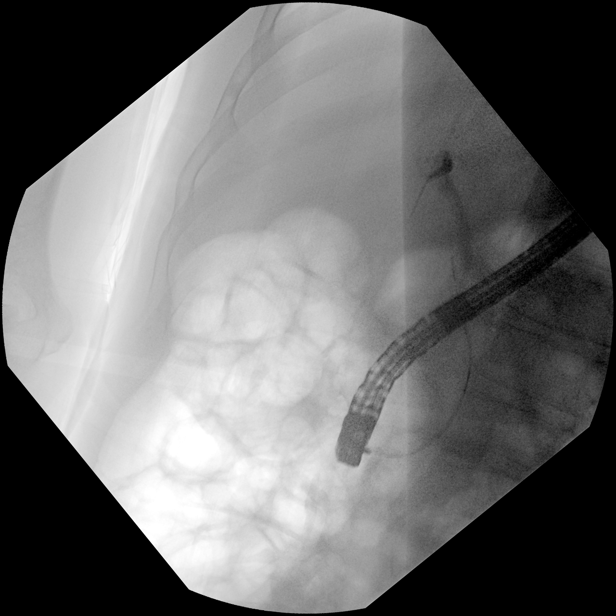
[im 7/12]
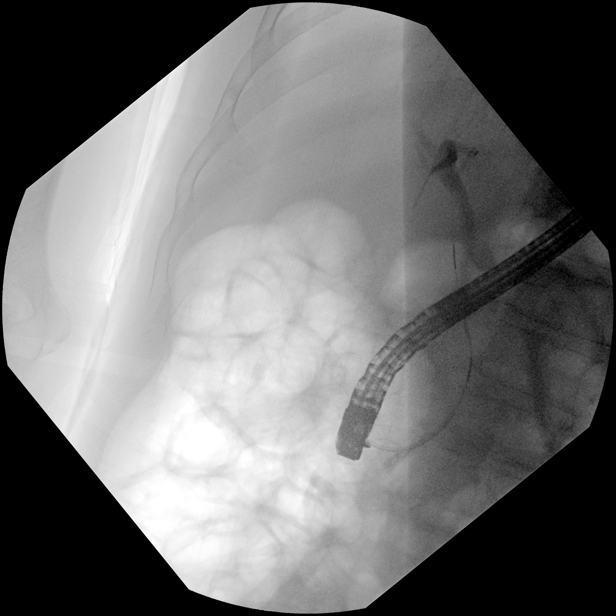
[im 8/12]
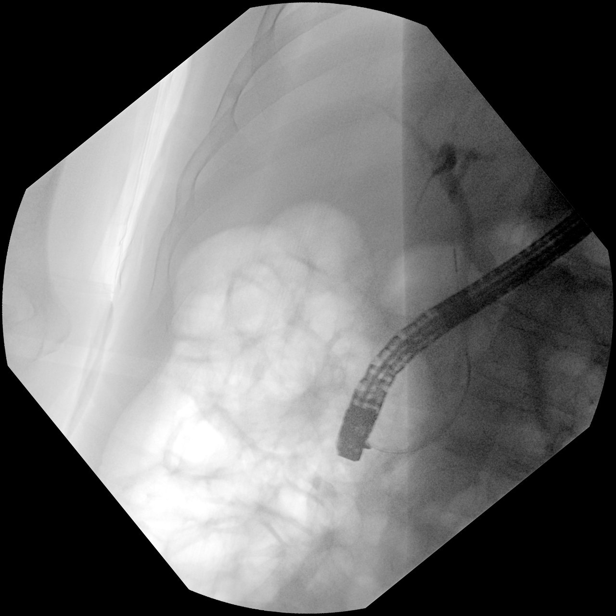
[im 9/12]
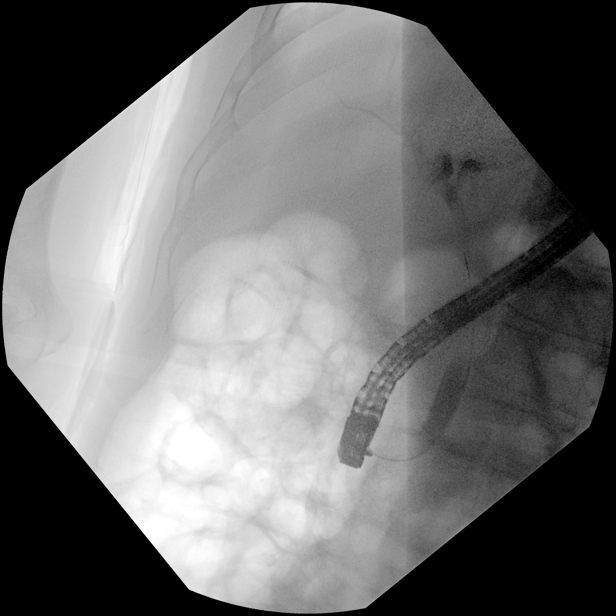
[im 10/12]
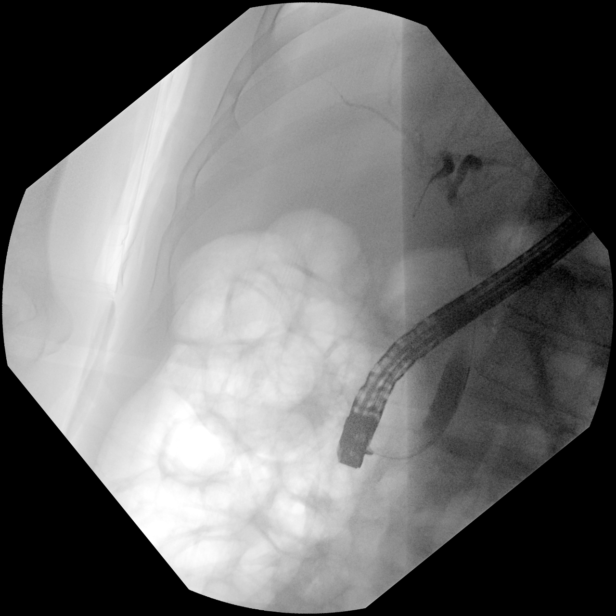
[im 11/12]
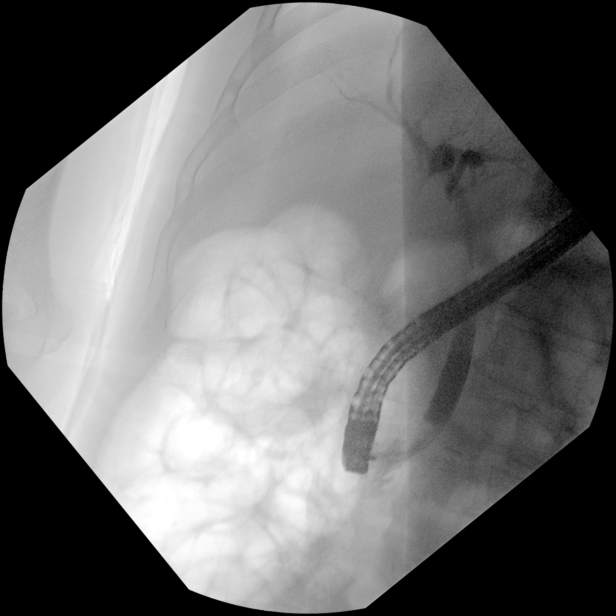
[im 12/12]
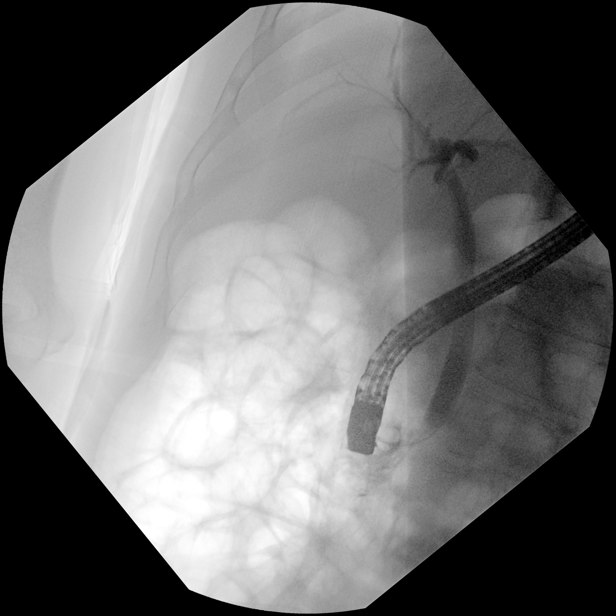

[12 of 12 positions shown; findings below may reference images not displayed]

FINDINGS: Twelve spot intraoperative fluoroscopic images of the right upper
abdominal quadrant during ERCP are provided for review.

Initial image demonstrates an ERCP probe overlying the right upper
abdominal quadrant with potential cannulation of the pancreatic
duct.

Subsequent images demonstrate opacification of the common bile duct.

There is an apparent nonocclusive filling defect within distal
aspect of the CBD (image 11), not definitely seen on completion
image (image 12).
IMPRESSION: ERCP with findings suggestive of nonocclusive choledocholithiasis
with subsequent presumed stone extraction.

These images were submitted for radiologic interpretation only.
Please see the procedural report for the amount of contrast and the
fluoroscopy time utilized.

## 2018-09-25 IMAGING — MR MR MRCP
3 of 7 series · 21 of 48 positions shown · non-contrast
Comparison: Chest CT [DATE] and abdominal CT scan [DATE].
Ultrasound examination [DATE]

CLINICAL DATA: Evaluate for distal common bile duct stone.

EXAM:
MRI ABDOMEN WITHOUT CONTRAST  (INCLUDING MRCP)
TECHNIQUE: Multiplanar multisequence MR imaging of the abdomen was performed.
Heavily T2-weighted images of the biliary and pancreatic ducts were
obtained, and three-dimensional MRCP images were rendered by post
processing.

[Series 3: ax haste · axial · 6.0mm · 1.19mm/px · z∈[-213,+54]mm · 10 of 38 slices shown]
[im 1/38]
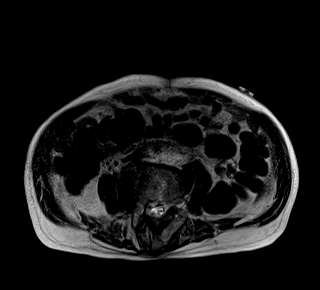
[im 5/38]
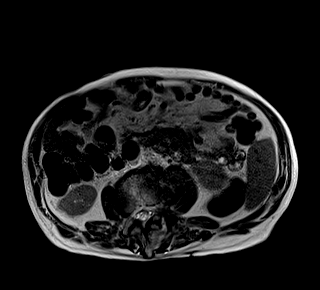
[im 9/38]
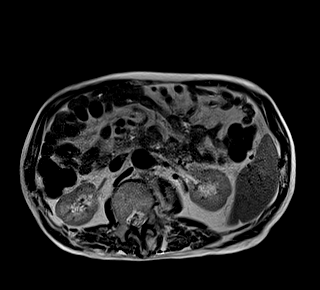
[im 13/38]
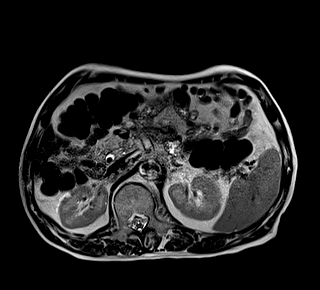
[im 17/38]
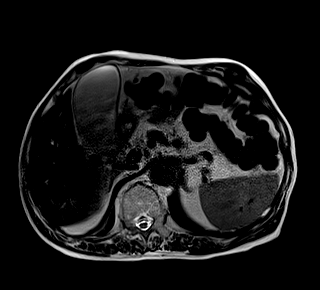
[im 21/38]
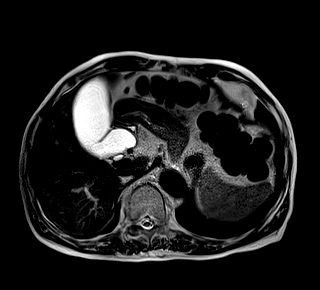
[im 25/38]
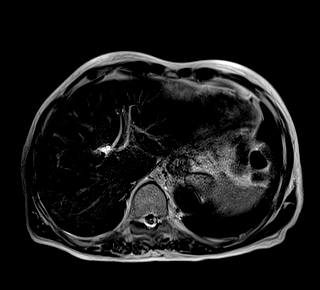
[im 29/38]
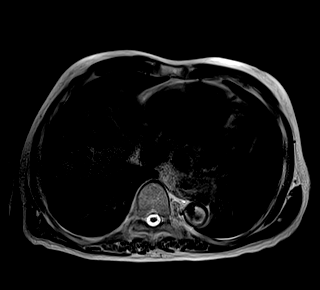
[im 33/38]
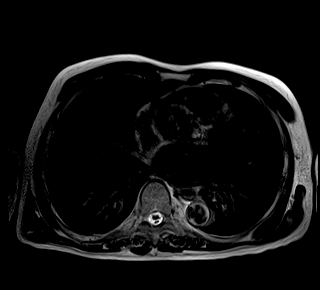
[im 38/38]
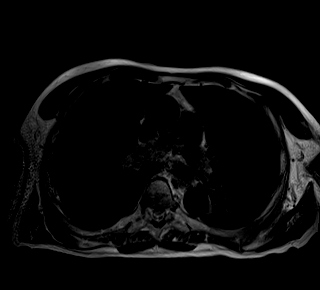

[Series 4: bSSFP · coronal · 6.0mm · 0.74mm/px · 8 of 35 slices shown]
[im 1/35]
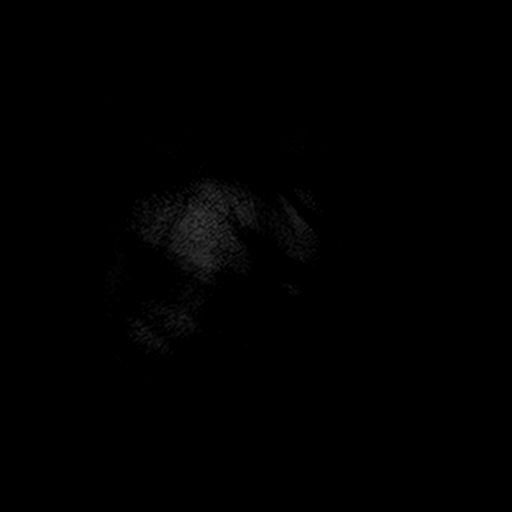
[im 5/35]
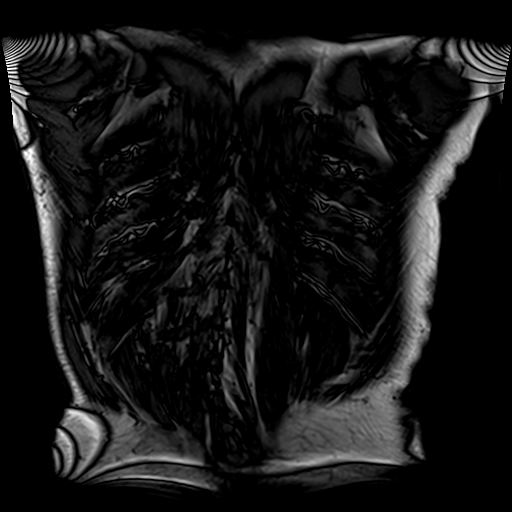
[im 10/35]
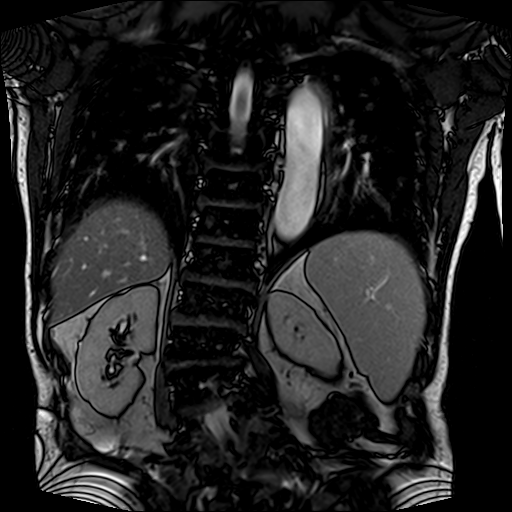
[im 15/35]
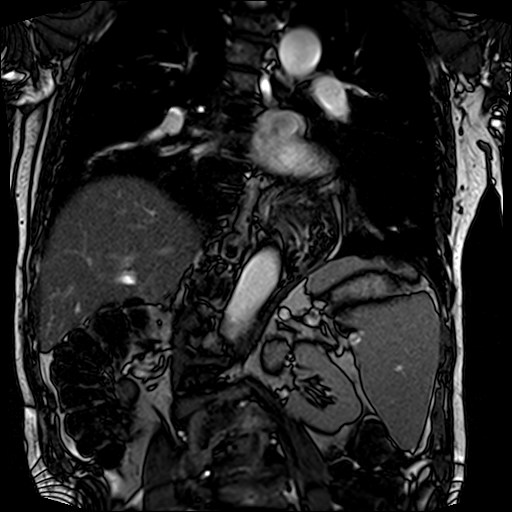
[im 20/35]
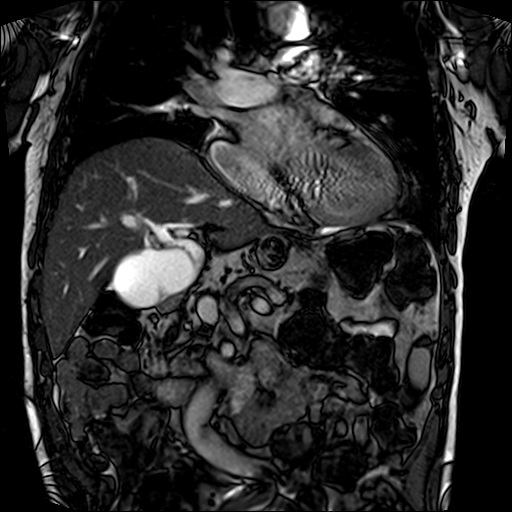
[im 25/35]
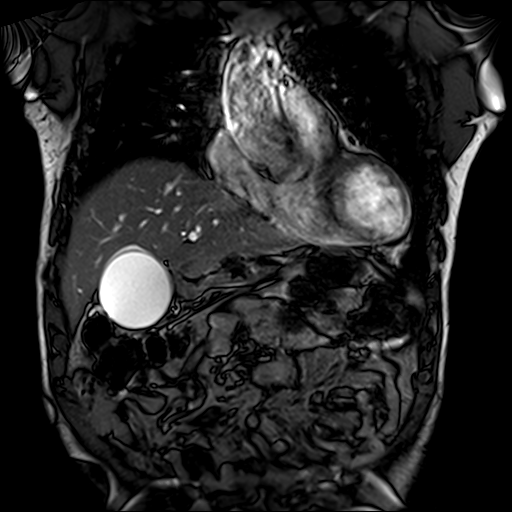
[im 30/35]
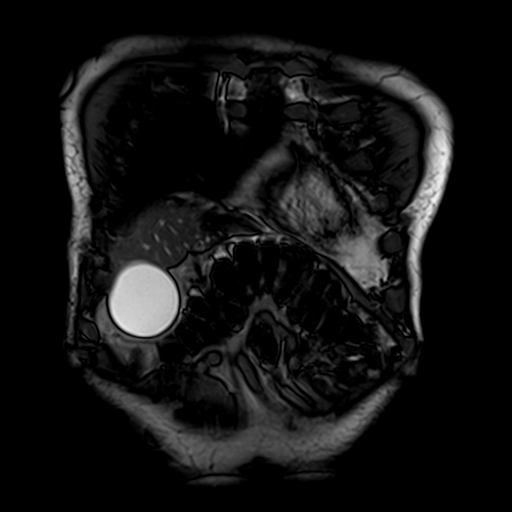
[im 35/35]
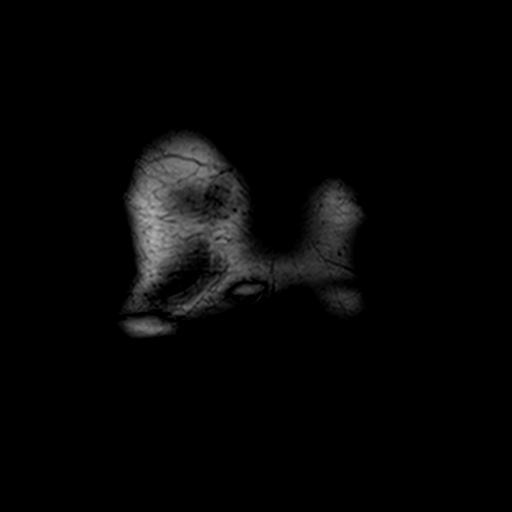

[Series 7: T2 fat-sat · axial · 6.0mm · 1.19mm/px · z∈[-202,+21]mm · 3 of 12 slices shown]
[im 1/12]
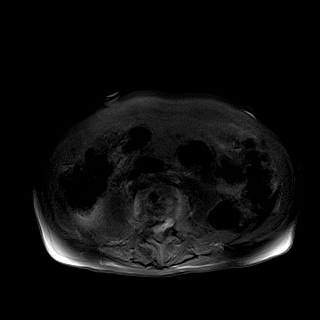
[im 6/12]
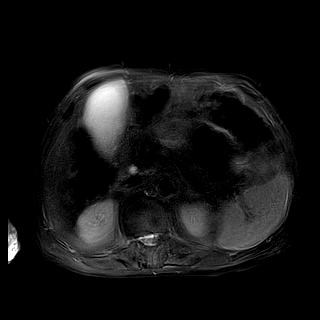
[im 12/12]
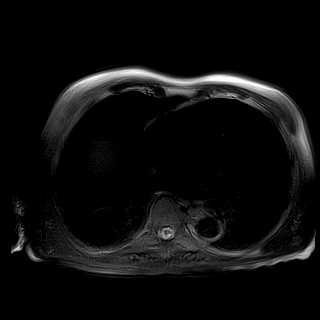

[21 of 48 positions shown; findings below may reference images not displayed]

FINDINGS: Examination is quite limited due to respiratory motion.

Lower chest: The lung bases are grossly clear. Moderate eventration
of the right hemidiaphragm. Mild cardiac enlargement but no
pericardial effusion.

Hepatobiliary: No obvious focal hepatic lesions. There is mild
intrahepatic biliary dilatation and mild common bile duct
dilatation, measuring up to 10 mm. The gallbladder is moderately
distended with maximum transverse diameter of 6.1 cm. A few small
layering gallstones are also noted. No gallbladder wall thickening
or pericholecystic inflammatory changes or fluid to suggest acute
cholecystitis.

There is an 8 x 7 mm distal common bile duct stone.

Pancreas:  No obvious mass, inflammation or ductal dilatation.

Spleen:  Borderline splenomegaly.  No focal lesions.

Adrenals/Urinary Tract: The adrenal glands and kidneys are grossly
normal.

Stomach/Bowel: Visualized portions within the abdomen are
unremarkable.

Vascular/Lymphatic: No pathologically enlarged lymph nodes
identified. No abdominal aortic aneurysm demonstrated.

Other:  No ascites or abdominal wall hernia.

Musculoskeletal: No significant bony findings. Lower lumbar
scoliosis noted.
IMPRESSION: 1. 8 x 7 mm distal common bile duct stone.
2. Moderate gallbladder distension and mild common bile duct
dilatation (10 mm). Small gallstones noted the gallbladder.
3. Limited examination but no gross abnormality involving the liver,
pancreas or spleen. Mild splenomegaly.

## 2018-09-25 IMAGING — US US ABDOMEN LIMITED
1 series · 14 of 24 positions shown · non-contrast
Comparison: Prior CT from [DATE]

CLINICAL DATA: Initial evaluation for acute right upper quadrant
abdominal pain.

EXAM:
ULTRASOUND ABDOMEN LIMITED RIGHT UPPER QUADRANT

[Series 1: us abdomen limited · 14 of 24 slices shown]
[im 1/24]
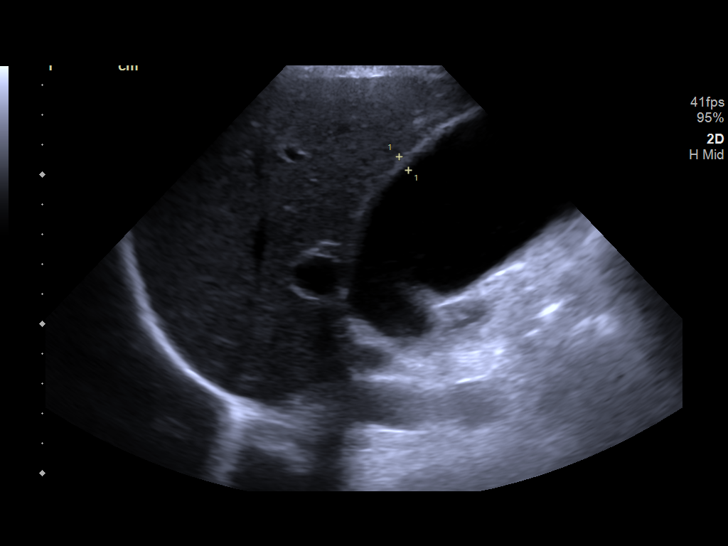
[im 3/24]
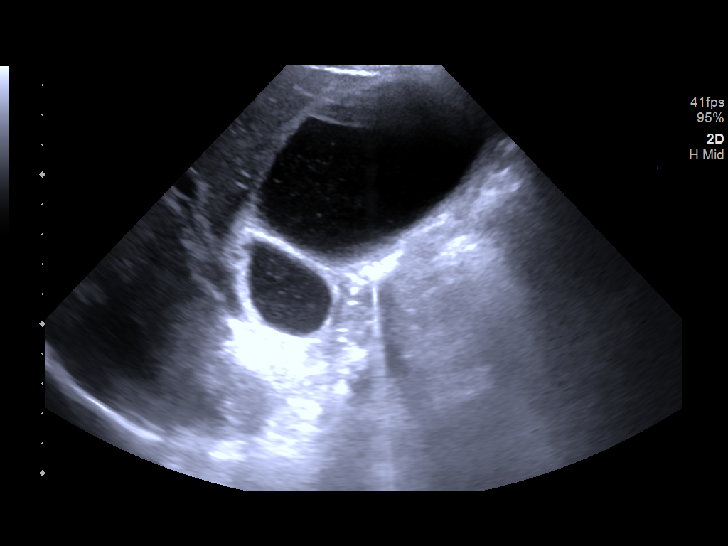
[im 5/24]
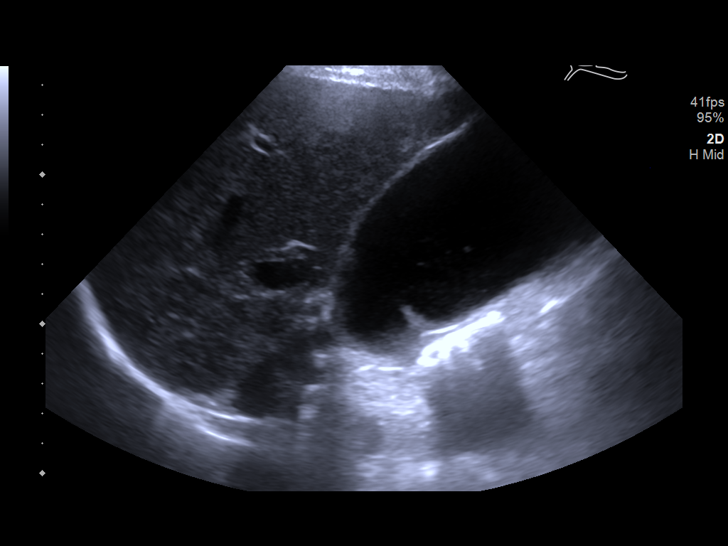
[im 7/24]
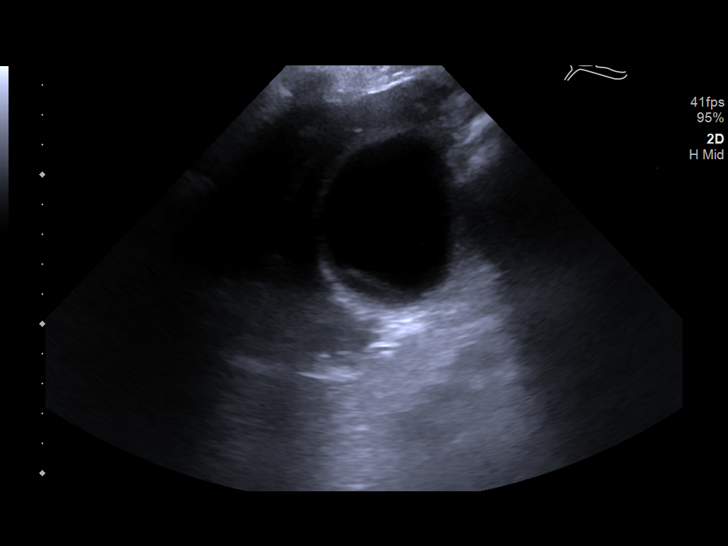
[im 8/24]
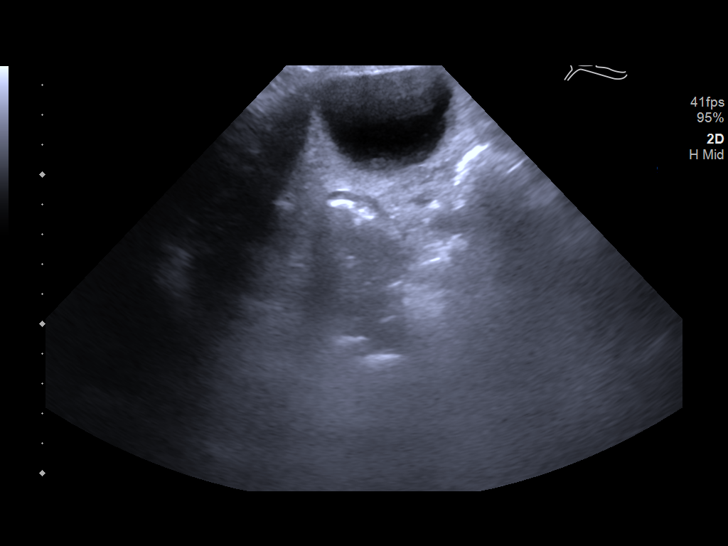
[im 10/24]
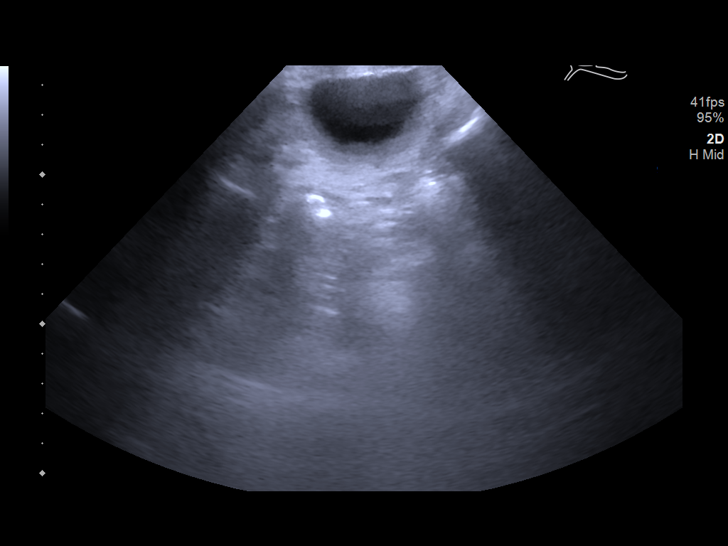
[im 12/24]
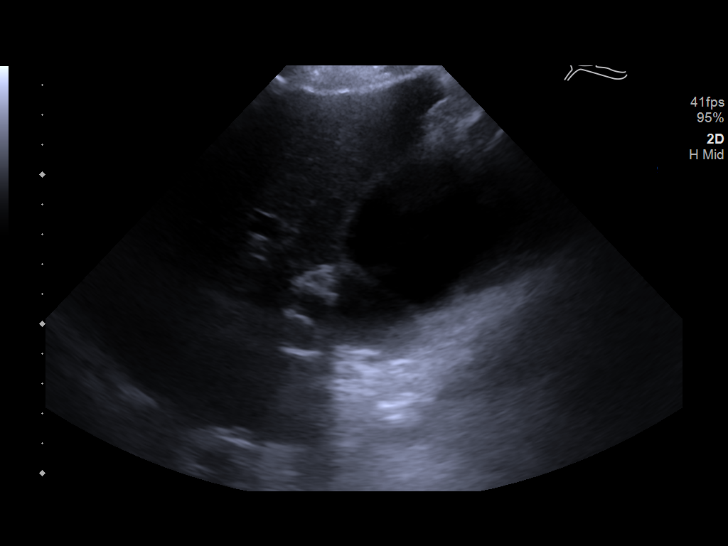
[im 13/24]
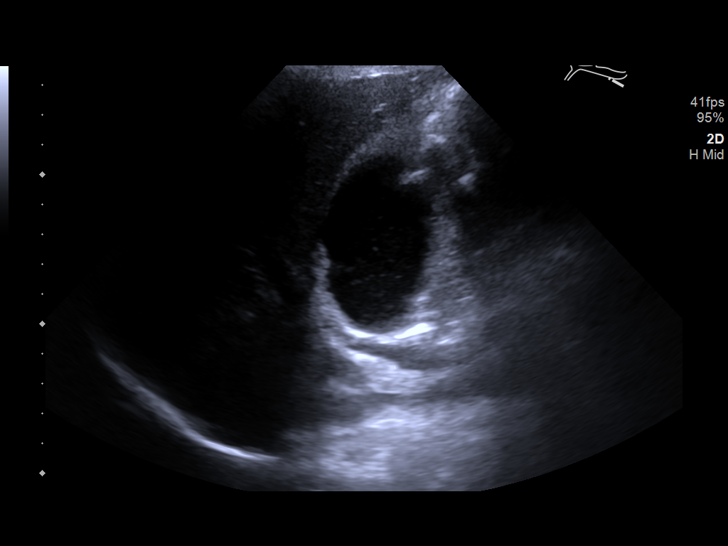
[im 15/24]
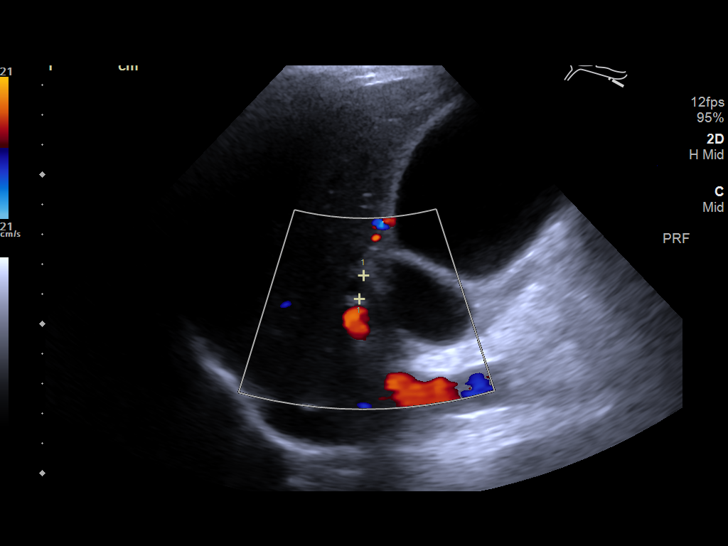
[im 17/24]
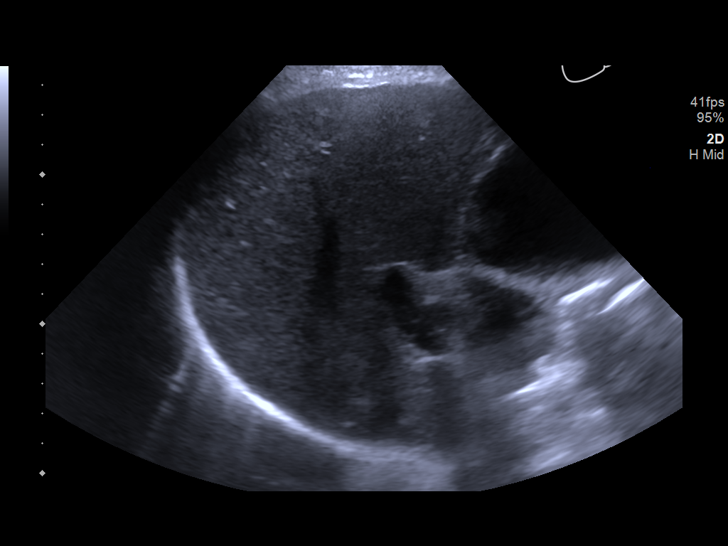
[im 19/24]
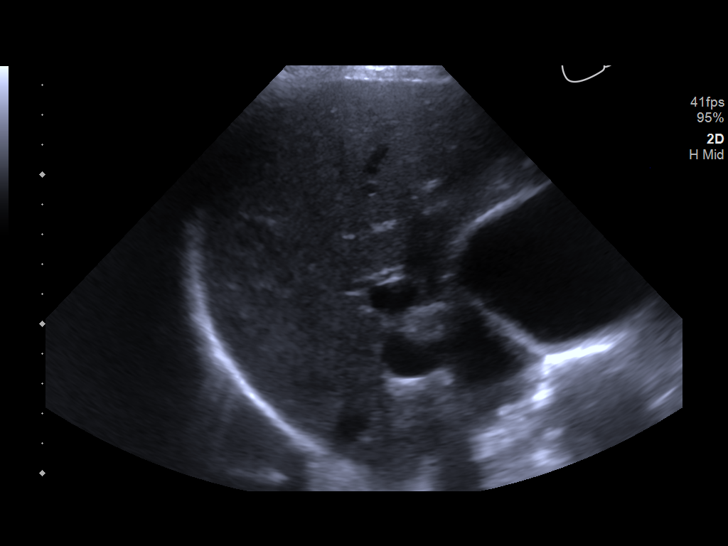
[im 20/24]
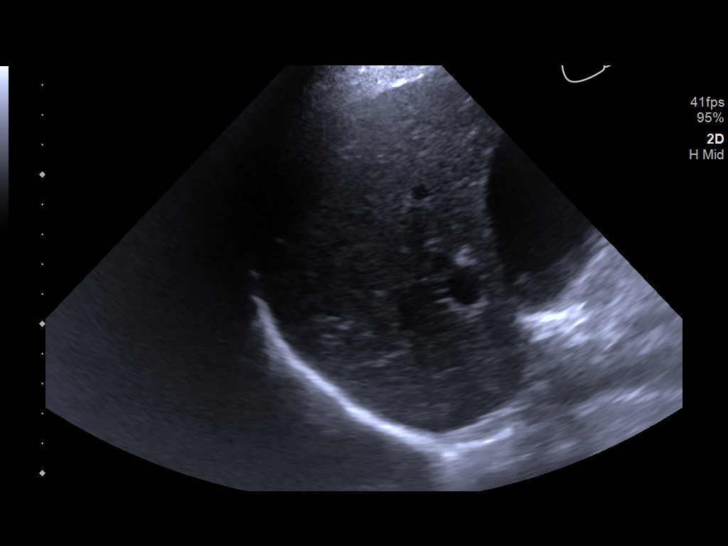
[im 22/24]
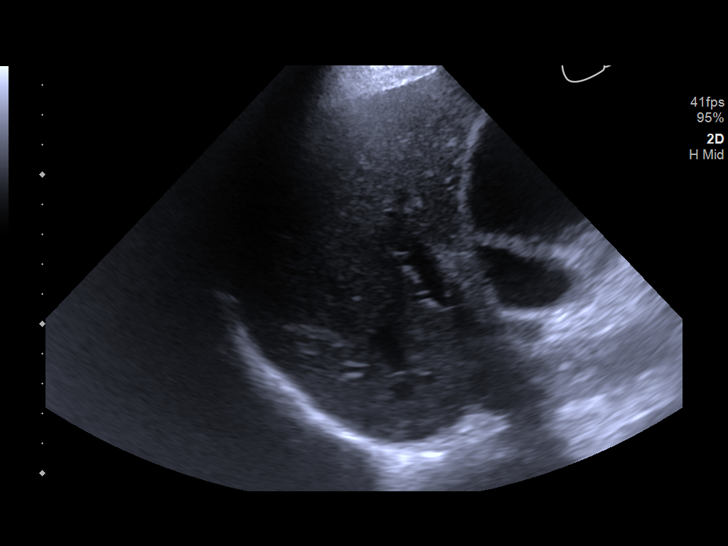
[im 24/24]
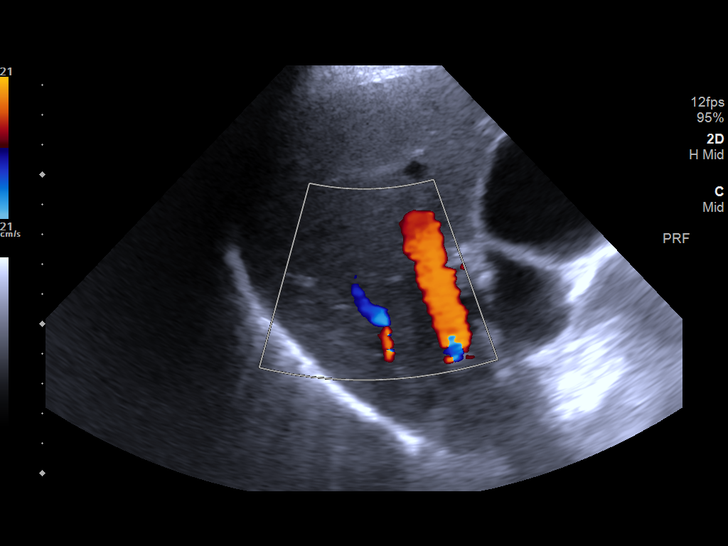

[14 of 24 positions shown; findings below may reference images not displayed]

FINDINGS: Gallbladder:

Internal sludge seen within the gallbladder without frank shadowing
echogenic stones. Gallbladder wall mildly thickened up to 5 mm. No
free pericholecystic fluid. No sonographic Murphy sign indicated by
sonographer.

Common bile duct:

Diameter: 8 mm

Liver:

No focal lesion identified. Within normal limits in parenchymal
echogenicity. Portal vein is patent on color Doppler imaging with
normal direction of blood flow towards the liver.
IMPRESSION: 1. Gallbladder sludge with mild gallbladder wall thickening. No
cholelithiasis or additional sonographic features to suggest acute
cholecystitis.
2. No biliary dilatation.

## 2018-09-25 IMAGING — CT CT ANGIO CHEST-ABD-PELV FOR DISSECTION W/ AND WO/W CM
2 of 7 series · 14 of 46 positions shown, 16 images · IV contrast (OMNI 350)
Comparison: Abdomen and pelvis CT from earlier today.

CLINICAL DATA: Chest pain with acute aortic syndrome suspected.

Patient already received contrast tonight but has normal renal
function, is not diabetic, and is to be hydrated. A reduced dose was
used.
EXAM:
CT ANGIOGRAPHY CHEST, ABDOMEN AND PELVIS
TECHNIQUE: Multidetector CT imaging through the chest, abdomen and pelvis was
performed using the standard protocol during bolus administration of
intravenous contrast. Multiplanar reconstructed images and MIPs were
obtained and reviewed to evaluate the vascular anatomy.
CONTRAST:  75mL OMNIPAQUE IOHEXOL 300 MG/ML  SOLN

[Series 7: dissection 2mm · axial · 0.88mm/px · z∈[+790,+1398]mm · 11 of 340 slices shown, 13 images]
[im 18/340  soft-tissue]
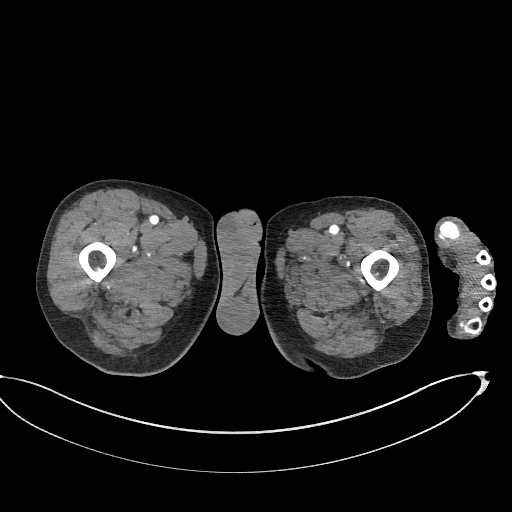
[im 18/340  bone]
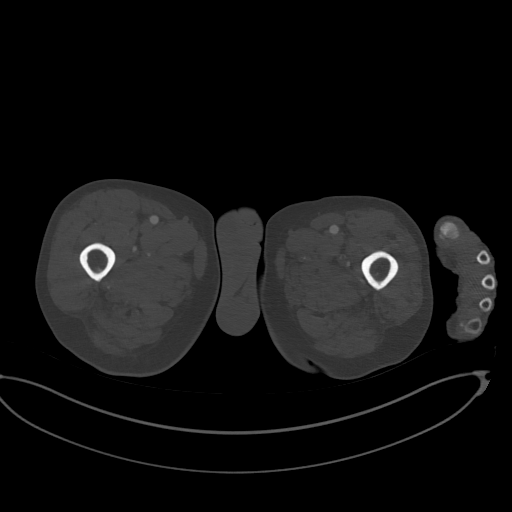
[im 54/340  soft-tissue]
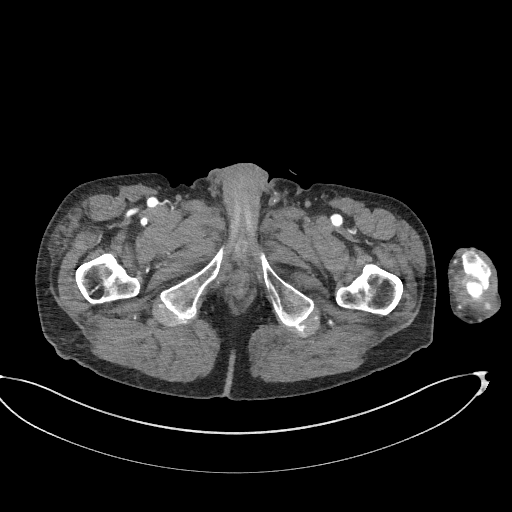
[im 90/340  soft-tissue]
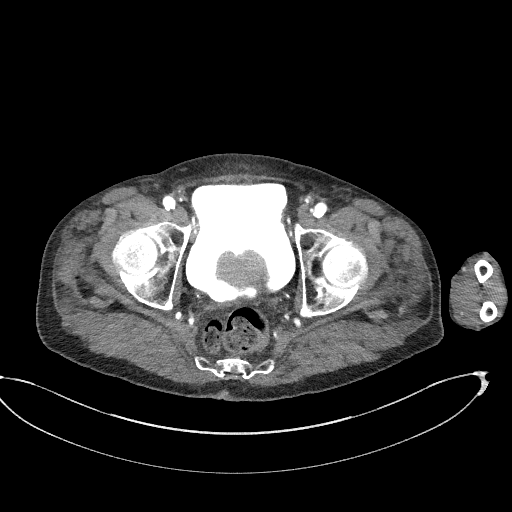
[im 108/340  soft-tissue]
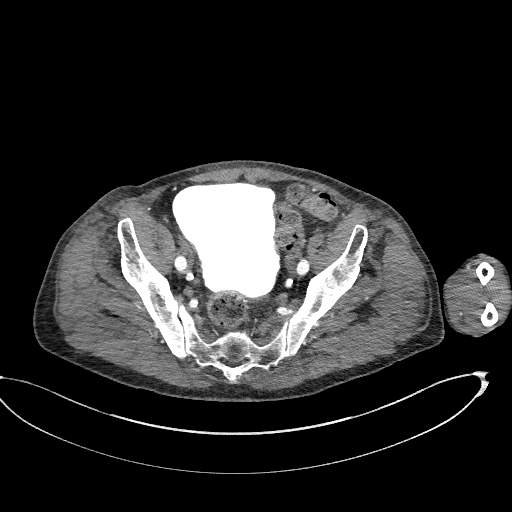
[im 143/340  soft-tissue]
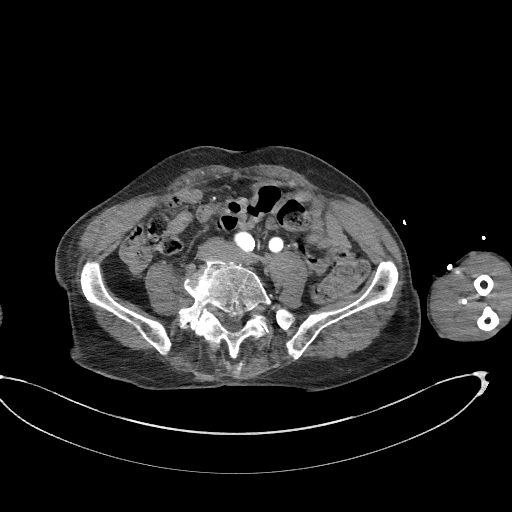
[im 179/340  soft-tissue]
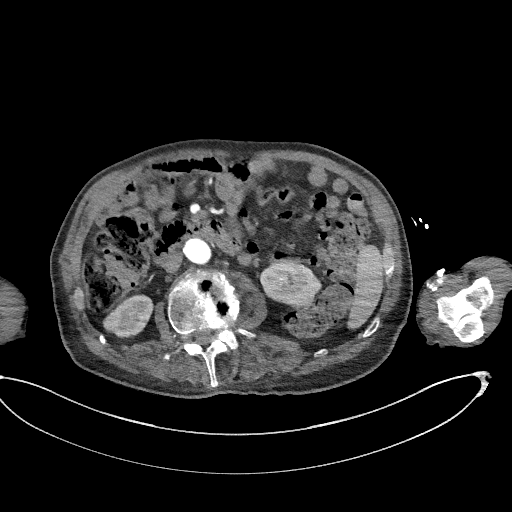
[im 197/340  soft-tissue]
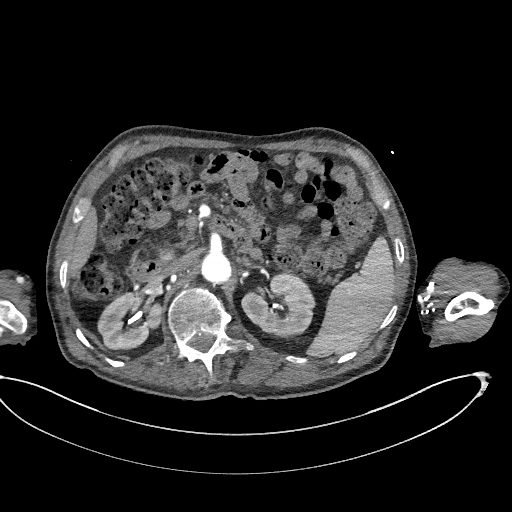
[im 232/340  soft-tissue]
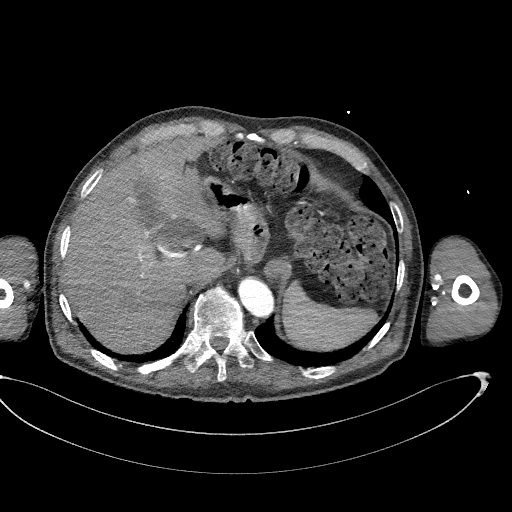
[im 250/340  soft-tissue]
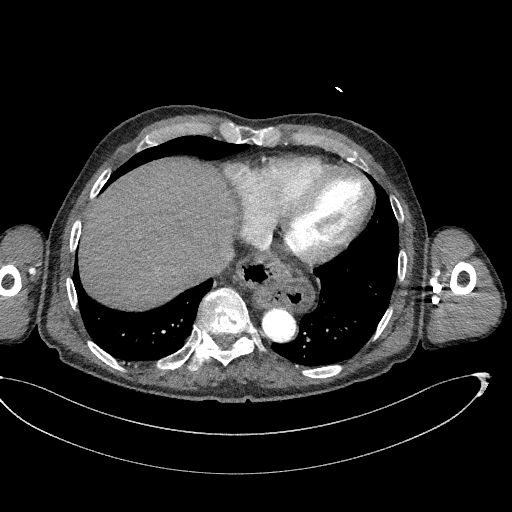
[im 250/340  bone]
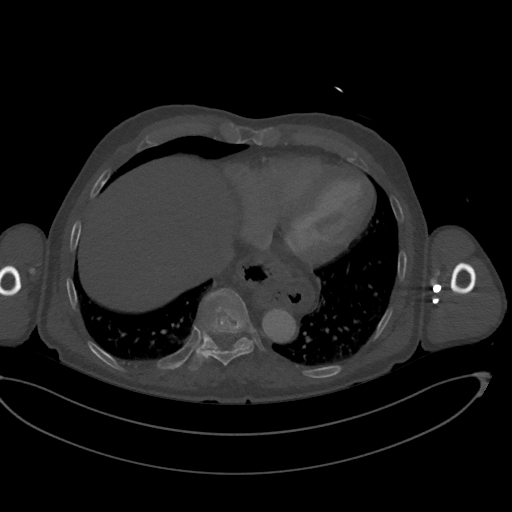
[im 286/340  soft-tissue]
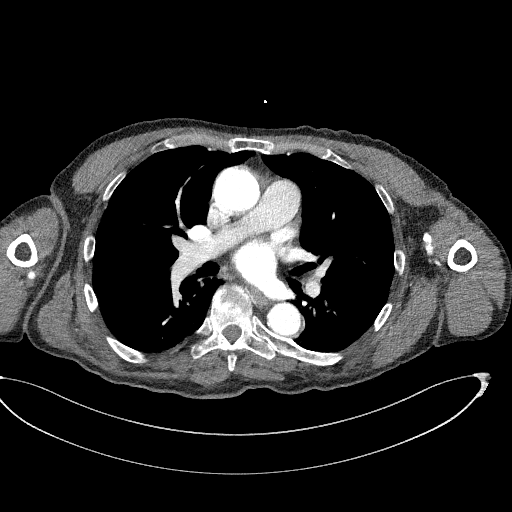
[im 322/340  soft-tissue]
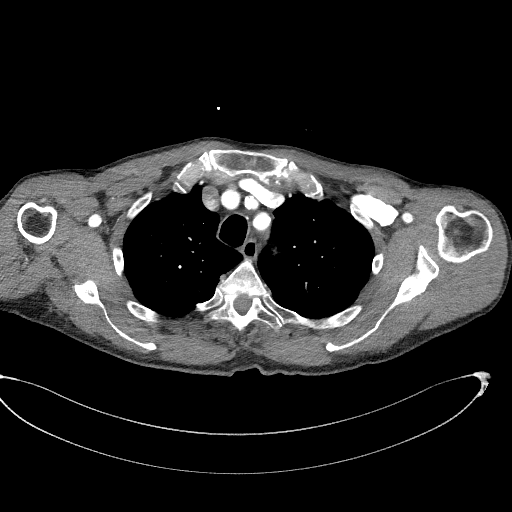

[Series 10: dissection 2mm cor · coronal · 0.89mm/px · 3 of 141 slices shown]
[im 36/141  soft-tissue]
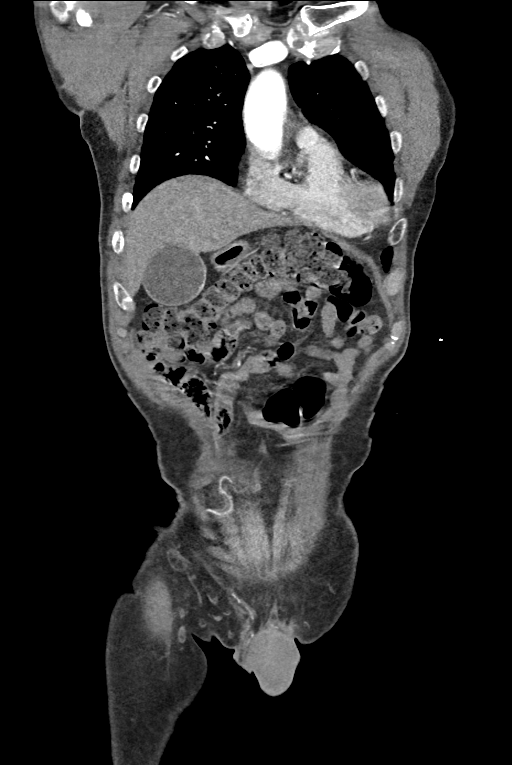
[im 71/141  soft-tissue]
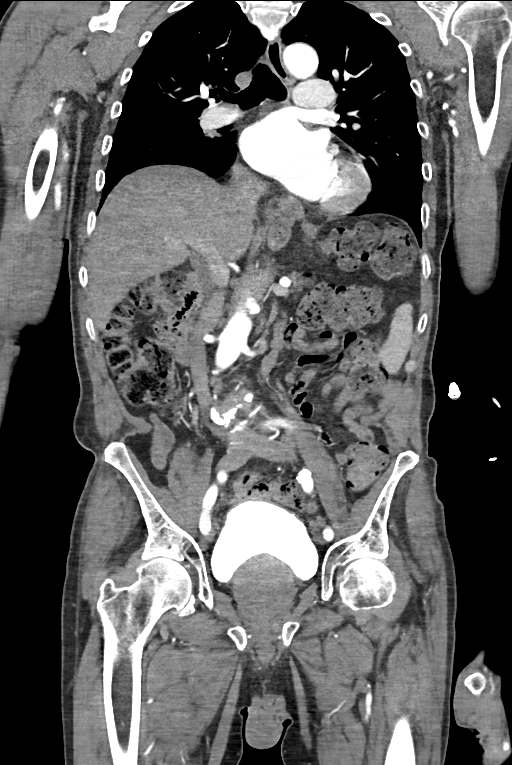
[im 106/141  soft-tissue]
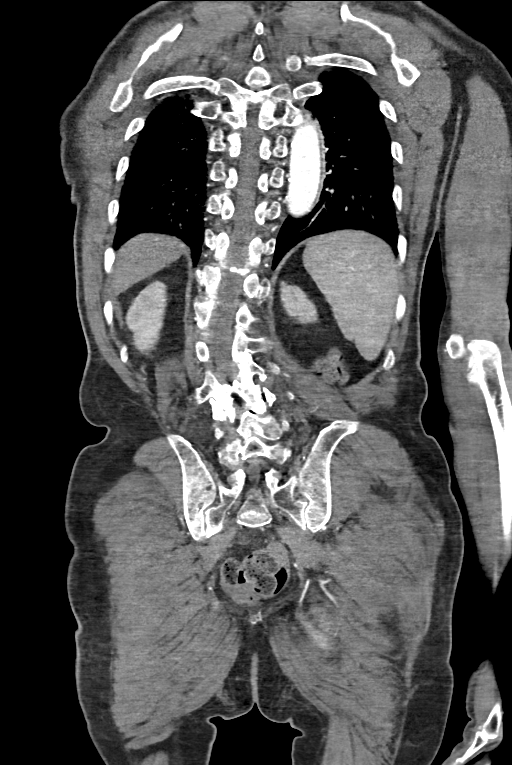

[14 of 46 positions shown; findings below may reference images not displayed]

FINDINGS: CTA CHEST FINDINGS

Cardiovascular: Noncontrast phase shows no intramural hematoma of
the aorta. No cardiomegaly or pericardial effusion. No aortic
dissection inflammatory wall thickening on postcontrast imaging.
Partial coverage of the right proximal subclavian shows likely
high-grade stenosis. There is limited opacification of the pulmonary
arteries with no central filling defect noted.

Mediastinum/Nodes: Negative for adenopathy.  Large hiatal hernia.

Lungs/Pleura: Central airways are clear. There is no edema,
consolidation, effusion, or pneumothorax.

Musculoskeletal: No acute or aggressive finding. Spinal degeneration
and dextroscoliosis.

Review of the MIP images confirms the above findings.

CTA ABDOMEN AND PELVIS FINDINGS

VASCULAR

Aorta: Atheromatous plaque.  No aneurysm or dissection.

Celiac: Patent without evidence of aneurysm, dissection, vasculitis
or significant stenosis.

SMA: Minimal atheromatous changes. No branch occlusion, beading, or
flow limiting stenosis.

Renals: Mild calcified plaque at the renal artery ostia. No stenosis
or aneurysm seen.

IMA: Patent

Inflow: Atherosclerosis

Veins: Negative

Review of the MIP images confirms the above findings.

NON-VASCULAR

Hepatobiliary: Mild altered perfusion about the gallbladder fossa
better evaluated on prior.Full gallbladder which was evaluated by
right upper quadrant ultrasound earlier. There is a 10 mm
high-density at the distal common bile duct. The common bile duct
measures 1 cm in diameter.

Pancreas: Prominent fatty atrophy.

Spleen: Prominent craniocaudal span but no thickening.

Adrenals/Urinary Tract: Negative adrenals. No hydronephrosis or
stone. Contrast in the bladder from prior study showing mild
trabeculation.

Stomach/Bowel: Formed stool throughout the tortuous colon. No
obstruction or bowel inflammation is seen. Hiatal hernia as noted
above.

Lymphatic: No mass or adenopathy.

Reproductive:Enlarged prostate

Other: No ascites or pneumoperitoneum.

Musculoskeletal: Advanced spinal degeneration with dextroscoliosis.
There is remarkably severe L3-4 disc degeneration.

Review of the MIP images confirms the above findings.
IMPRESSION: 1. Probable 1 cm distal CBD stone
2. No findings of acute aortic syndrome.
3. Suspect high-grade narrowing of the proximal right subclavian,
which is partially covered.
4. Generalized stool retention.

## 2018-09-25 SURGERY — ENDOSCOPIC RETROGRADE CHOLANGIOPANCREATOGRAPHY (ERCP) WITH PROPOFOL
Anesthesia: General

## 2018-09-25 MED ORDER — HYDRALAZINE HCL 20 MG/ML IJ SOLN
10.0000 mg | Freq: Four times a day (QID) | INTRAMUSCULAR | Status: AC | PRN
  Administered 2018-09-25: 10 mg via INTRAVENOUS

## 2018-09-25 MED ORDER — SUGAMMADEX SODIUM 200 MG/2ML IV SOLN
INTRAVENOUS | Status: DC | PRN
  Administered 2018-09-25: 150 mg via INTRAVENOUS

## 2018-09-25 MED ORDER — HYDROMORPHONE HCL 1 MG/ML IJ SOLN
1.0000 mg | Freq: Once | INTRAMUSCULAR | Status: AC
  Administered 2018-09-25: 1 mg via INTRAVENOUS
  Filled 2018-09-25: qty 1

## 2018-09-25 MED ORDER — HYDROMORPHONE HCL 1 MG/ML IJ SOLN
2.0000 mg | Freq: Once | INTRAMUSCULAR | Status: AC
  Administered 2018-09-25: 2 mg via INTRAVENOUS
  Filled 2018-09-25: qty 2

## 2018-09-25 MED ORDER — INDOMETHACIN 50 MG RE SUPP
RECTAL | Status: AC
  Filled 2018-09-25: qty 2

## 2018-09-25 MED ORDER — HYDROMORPHONE HCL 1 MG/ML IJ SOLN
2.0000 mg | INTRAMUSCULAR | Status: DC | PRN
  Administered 2018-09-25 – 2018-09-26 (×5): 2 mg via INTRAVENOUS
  Filled 2018-09-25 (×6): qty 2

## 2018-09-25 MED ORDER — HALOPERIDOL LACTATE 5 MG/ML IJ SOLN
2.0000 mg | Freq: Once | INTRAMUSCULAR | Status: AC
  Administered 2018-09-25: 2 mg via INTRAVENOUS
  Filled 2018-09-25: qty 1

## 2018-09-25 MED ORDER — ROCURONIUM BROMIDE 50 MG/5ML IV SOSY
PREFILLED_SYRINGE | INTRAVENOUS | Status: DC | PRN
  Administered 2018-09-25: 50 mg via INTRAVENOUS

## 2018-09-25 MED ORDER — LORAZEPAM 2 MG/ML IJ SOLN
0.5000 mg | Freq: Once | INTRAMUSCULAR | Status: AC
  Administered 2018-09-25: 0.5 mg via INTRAVENOUS
  Filled 2018-09-25: qty 1

## 2018-09-25 MED ORDER — ONDANSETRON HCL 4 MG/2ML IJ SOLN: 4.0000 mg | Freq: Once | INTRAMUSCULAR | Status: DC | PRN

## 2018-09-25 MED ORDER — ACETAMINOPHEN 650 MG RE SUPP: 650.0000 mg | Freq: Four times a day (QID) | RECTAL | Status: DC | PRN

## 2018-09-25 MED ORDER — IOHEXOL 300 MG/ML  SOLN
75.0000 mL | Freq: Once | INTRAMUSCULAR | Status: AC | PRN
  Administered 2018-09-25: 75 mL via INTRAVENOUS

## 2018-09-25 MED ORDER — PIPERACILLIN-TAZOBACTAM 3.375 G IVPB
3.3750 g | Freq: Three times a day (TID) | INTRAVENOUS | Status: DC
  Administered 2018-09-25 – 2018-09-28 (×9): 3.375 g via INTRAVENOUS
  Filled 2018-09-25 (×9): qty 50

## 2018-09-25 MED ORDER — SODIUM CHLORIDE 0.9 % IV SOLN
INTRAVENOUS | Status: DC | PRN
  Administered 2018-09-25: 50 ug/min via INTRAVENOUS

## 2018-09-25 MED ORDER — PIPERACILLIN-TAZOBACTAM 3.375 G IVPB 30 MIN
3.3750 g | Freq: Once | INTRAVENOUS | Status: AC
  Administered 2018-09-25: 3.375 g via INTRAVENOUS
  Filled 2018-09-25: qty 50

## 2018-09-25 MED ORDER — HYDROMORPHONE HCL 1 MG/ML IJ SOLN
0.2500 mg | INTRAMUSCULAR | Status: DC | PRN
  Administered 2018-09-25 (×2): 0.5 mg via INTRAVENOUS

## 2018-09-25 MED ORDER — ONDANSETRON HCL 4 MG PO TABS: 4.0000 mg | ORAL_TABLET | Freq: Four times a day (QID) | ORAL | Status: DC | PRN

## 2018-09-25 MED ORDER — FENTANYL CITRATE (PF) 100 MCG/2ML IJ SOLN
INTRAMUSCULAR | Status: DC | PRN
  Administered 2018-09-25: 100 ug via INTRAVENOUS

## 2018-09-25 MED ORDER — ONDANSETRON HCL 4 MG/2ML IJ SOLN
INTRAMUSCULAR | Status: DC | PRN
  Administered 2018-09-25: 4 mg via INTRAVENOUS

## 2018-09-25 MED ORDER — SODIUM CHLORIDE 0.9 % IV SOLN
INTRAVENOUS | Status: AC
  Administered 2018-09-25 – 2018-09-26 (×2): via INTRAVENOUS

## 2018-09-25 MED ORDER — BISACODYL 10 MG RE SUPP
10.0000 mg | Freq: Once | RECTAL | Status: AC
  Administered 2018-09-25: 10 mg via RECTAL

## 2018-09-25 MED ORDER — LIDOCAINE 2% (20 MG/ML) 5 ML SYRINGE
INTRAMUSCULAR | Status: DC | PRN
  Administered 2018-09-25: 100 mg via INTRAVENOUS

## 2018-09-25 MED ORDER — HYDRALAZINE HCL 20 MG/ML IJ SOLN
INTRAMUSCULAR | Status: AC
  Administered 2018-09-25: 10 mg via INTRAVENOUS
  Filled 2018-09-25: qty 1

## 2018-09-25 MED ORDER — PHENYLEPHRINE 40 MCG/ML (10ML) SYRINGE FOR IV PUSH (FOR BLOOD PRESSURE SUPPORT)
PREFILLED_SYRINGE | INTRAVENOUS | Status: DC | PRN
  Administered 2018-09-25 (×2): 120 ug via INTRAVENOUS

## 2018-09-25 MED ORDER — BISACODYL 10 MG RE SUPP
10.0000 mg | Freq: Two times a day (BID) | RECTAL | Status: AC
  Administered 2018-09-25 – 2018-09-26 (×3): 10 mg via RECTAL
  Filled 2018-09-25 (×3): qty 1

## 2018-09-25 MED ORDER — IOPAMIDOL (ISOVUE-300) INJECTION 61%
INTRAVENOUS | Status: AC
  Filled 2018-09-25: qty 100

## 2018-09-25 MED ORDER — HYDROMORPHONE HCL 1 MG/ML IJ SOLN
INTRAMUSCULAR | Status: AC
  Administered 2018-09-25: 0.5 mg via INTRAVENOUS
  Filled 2018-09-25: qty 1

## 2018-09-25 MED ORDER — HYDRALAZINE HCL 20 MG/ML IJ SOLN
5.0000 mg | INTRAMUSCULAR | Status: DC | PRN
  Administered 2018-09-25 – 2018-09-26 (×2): 5 mg via INTRAVENOUS
  Filled 2018-09-25 (×2): qty 1

## 2018-09-25 MED ORDER — DIPHENHYDRAMINE HCL 50 MG/ML IJ SOLN
25.0000 mg | Freq: Once | INTRAMUSCULAR | Status: AC
  Administered 2018-09-25: 25 mg via INTRAVENOUS
  Filled 2018-09-25: qty 1

## 2018-09-25 MED ORDER — ACETAMINOPHEN 325 MG PO TABS
650.0000 mg | ORAL_TABLET | Freq: Four times a day (QID) | ORAL | Status: DC | PRN
  Administered 2018-10-01: 650 mg via ORAL
  Filled 2018-09-25: qty 2

## 2018-09-25 MED ORDER — PROPOFOL 10 MG/ML IV BOLUS
INTRAVENOUS | Status: DC | PRN
  Administered 2018-09-25: 160 mg via INTRAVENOUS

## 2018-09-25 MED ORDER — KETOROLAC TROMETHAMINE 15 MG/ML IJ SOLN
15.0000 mg | Freq: Once | INTRAMUSCULAR | Status: AC
  Administered 2018-09-25: 15 mg via INTRAVENOUS
  Filled 2018-09-25: qty 1

## 2018-09-25 MED ORDER — DEXAMETHASONE SODIUM PHOSPHATE 10 MG/ML IJ SOLN
INTRAMUSCULAR | Status: DC | PRN
  Administered 2018-09-25: 10 mg via INTRAVENOUS

## 2018-09-25 MED ORDER — GLUCAGON HCL RDNA (DIAGNOSTIC) 1 MG IJ SOLR
INTRAMUSCULAR | Status: DC | PRN
  Administered 2018-09-25: 1 mg via INTRAVENOUS

## 2018-09-25 MED ORDER — EPHEDRINE SULFATE-NACL 50-0.9 MG/10ML-% IV SOSY
PREFILLED_SYRINGE | INTRAVENOUS | Status: DC | PRN
  Administered 2018-09-25 (×3): 15 mg via INTRAVENOUS

## 2018-09-25 MED ORDER — POLYETHYLENE GLYCOL 3350 17 G PO PACK
17.0000 g | PACK | Freq: Three times a day (TID) | ORAL | Status: DC
  Administered 2018-09-26: 17 g via ORAL
  Filled 2018-09-25 (×2): qty 1

## 2018-09-25 MED ORDER — SENNA 8.6 MG PO TABS
2.0000 | ORAL_TABLET | Freq: Every day | ORAL | Status: DC
  Filled 2018-09-25: qty 2

## 2018-09-25 MED ORDER — MORPHINE SULFATE (PF) 2 MG/ML IV SOLN
1.0000 mg | Freq: Once | INTRAVENOUS | Status: AC
  Administered 2018-09-25: 1 mg via INTRAVENOUS
  Filled 2018-09-25: qty 1

## 2018-09-25 MED ORDER — INDOMETHACIN 50 MG RE SUPP
RECTAL | Status: DC | PRN
  Administered 2018-09-25: 100 mg via RECTAL

## 2018-09-25 MED ORDER — GLUCAGON HCL RDNA (DIAGNOSTIC) 1 MG IJ SOLR
INTRAMUSCULAR | Status: AC
  Filled 2018-09-25: qty 1

## 2018-09-25 MED ORDER — ONDANSETRON HCL 4 MG/2ML IJ SOLN
4.0000 mg | Freq: Four times a day (QID) | INTRAMUSCULAR | Status: DC | PRN
  Administered 2018-09-25 – 2018-10-05 (×4): 4 mg via INTRAVENOUS
  Filled 2018-09-25 (×4): qty 2

## 2018-09-25 MED ORDER — GLYCOPYRROLATE 0.2 MG/ML IJ SOLN
INTRAMUSCULAR | Status: DC | PRN
  Administered 2018-09-25: 0.2 mg via INTRAVENOUS

## 2018-09-25 MED ORDER — LORAZEPAM 2 MG/ML IJ SOLN
1.0000 mg | Freq: Once | INTRAMUSCULAR | Status: AC
  Administered 2018-09-25: 1 mg via INTRAVENOUS
  Filled 2018-09-25: qty 1

## 2018-09-25 NOTE — Progress Notes (Signed)
Patient received to the unit via bed. Patient is alert and disoriented x4. Iv in place and running fluid. Skin assessment done with another nurse. Patient given medicine for pain and schedule as well. Patient/ family given instructions about call bell and phone. Bed in low position and call bell in reach.

## 2018-09-25 NOTE — ED Notes (Signed)
This RN attempted foley placement, unsuccessful.

## 2018-09-25 NOTE — Op Note (Signed)
[facility] Patient Name: Riley Eaton Procedure Date : 09/25/2018 MRN: [id number] Attending MD: Ronnette Juniper , MD Date of Birth: 1940-10-10 CSN: [id number] Age: 78 Admit Type: Inpatient Procedure:                ERCP Indications:              Common bile duct stone(s) noted on MRCP Providers:                Ronnette Juniper, MD, Vista Lawman, RN, Dorise Hiss, RN,                            Charolette Child, Technician, Lance Coon, CRNA Referring MD:              Medicines:                Monitored Anesthesia Care Complications:            No immediate complications. Estimated blood loss:                            None Estimated Blood Loss:     Estimated blood loss was minimal. Procedure:                Pre-Anesthesia Assessment:                           - Prior to the procedure, a History and Physical                            was performed, and patient medications and                            allergies were reviewed. The patient's tolerance of                            previous anesthesia was also reviewed. The risks                            and benefits of the procedure and the sedation                            options and risks were discussed with the patient.                            All questions were answered, and informed consent                            was obtained. Prior Anticoagulants: The patient has                            taken no previous anticoagulant or antiplatelet                            agents. ASA Grade Assessment: III - A patient with  severe systemic disease. After reviewing the risks                            and benefits, the patient was deemed in                            satisfactory condition to undergo the procedure.                           After obtaining informed consent, the scope was                            passed under direct vision. Throughout the                            procedure,  the patient's blood pressure, pulse, and                            oxygen saturations were monitored continuously. The                            [id number]-Q180V ([id number] Olympus duodenoscope was                            introduced through the mouth, and used to inject                            contrast into and used to inject contrast into the                            bile duct.                           During passage of scope, fresh blood was noted in                            the oropharynx and via both the nose, this could                            have been related to trauma to adenoids. It                            resolved with packing of nares and no further                            bleeding was noted on withdrawl of the scope.                           The ERCP was accomplished without difficulty. The                            patient tolerated the procedure well. Scope In: Scope Out: Findings:      The scout film was normal. The esophagus was successfully  intubated       under direct vision. The scope was advanced to a normal major papilla in       the descending duodenum without detailed examination of the pharynx,       larynx and associated structures, and upper GI tract. The upper GI tract       was grossly normal.      The pancreatic duct was cannulated on several occasions, as such a       plastic 4 [place] -3 cm plastic stent was deployed in the pancreatic       duct. The pancreatic duct was never injected.      The bile duct was deeply cannulated with the sphincterotome after a       small precut sphincterotomy and extreme bowing the sphincterotome.       Contrast was injected. I personally interpreted the bile duct images.       There was brisk flow of contrast through the ducts. Image quality was       excellent. Contrast extended to the entire biliary tree.      The lower third of the main bile duct contained stone, which was 7 mm in       diameter. A  straight Roadrunner wire was passed into the biliary tree. A       12 mm biliary sphincterotomy was made with a braided sphincterotome       using ERBE electrocautery. There was no post-sphincterotomy bleeding.       The biliary tree was swept with a 12 mm balloon starting at the       bifurcation.      One stone was removed. No stones remained.      One 4 Fr by 3 cm plastic stent with a single internal pigtail was placed       2.5 cm into the ventral pancreatic duct. Clear fluid flowed through the       stent. The stent was in good position. Impression:               - Choledocholithiasis was found. Complete removal                            was accomplished by biliary sphincterotomy and                            balloon extraction.                           - A biliary sphincterotomy was performed.                           - The biliary tree was swept.                           - Pancreatic stent left in place, the pancreatic                            duct was never injected during the procedure and                            rectal indomethacin was given. Recommendation:           -  Advance diet as tolerated.                           - Patient will need an AXR in 2 weeks as an                            outpatient.If pancreatic stent has not fallen off                            spontaenously, will need an EGD for removal.                           - Recommend surgical evaluation for cholecystectomy. Procedure Code(s):        --- Professional ---                           [phone], Endoscopic retrograde                            cholangiopancreatography (ERCP); with placement of                            endoscopic stent into biliary or pancreatic duct,                            including pre- and post-dilation and guide wire                            passage, when performed, including sphincterotomy,                            when performed, each stent                            43264, Endoscopic retrograde                            cholangiopancreatography (ERCP); with removal of                            calculi/debris from biliary/pancreatic duct(s)                           43262, 59, Endoscopic retrograde                            cholangiopancreatography (ERCP); with                            sphincterotomy/papillotomy Diagnosis Code(s):        --- Professional ---                           K80.50, Calculus of bile duct without cholangitis                            or cholecystitis  without obstruction CPT copyright 2018 American Medical Association. All rights reserved. The codes documented in this report are preliminary and upon coder review may  be revised to meet current compliance requirements. Ronnette Juniper, MD 09/25/2018 5:42:07 PM This report has been signed electronically. Number of Addenda: 0

## 2018-09-25 NOTE — ED Notes (Signed)
Pt taken to Endo, then will go to 5W. Report given to ENDO and 5W by this RN. Dr Eliseo Squires aware that this RN was unsuccessful on foley placement. She is paging urology to place foley.

## 2018-09-25 NOTE — ED Notes (Signed)
Pt became nauseated after one sip of water. Pt is restless but compliant. MD notified

## 2018-09-25 NOTE — Consult Note (Signed)
Reason for Consult:cholecystitis Referring Physician: April Palumbo  Riley Eaton is an 78 y.o. male.  HPI: Patient was brought to the emergency room for evaluation of chest pain and agitation.  Work-up has revealed gallstones and mild elevation of bilirubin.  I was asked to see him for surgical management.  At this time he does endorse upper abdominal pain and nausea.  He is uncertain of when it started and is a very poor historian.  His family is not present.  Past Medical History:  Diagnosis Date  . Chronic back pain   . 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 Coronary artery calcification seen on CT scan   . Nerve pain   . Thoracic aortic aneurysm [facility])     Past Surgical History:  Procedure Laterality Date  . BACK SURGERY     09-2016  . back surgey      Family History  Problem Relation Age of Onset  . Cancer Mother   . Heart attack Father     Social History:  reports that he has never smoked. He has never used smokeless tobacco. He reports that he does not drink alcohol or use drugs.  Allergies: No Known Allergies  Medications:reviewed  Results for orders placed or performed during the hospital encounter of 09/24/18 (from the past 48 hour(s))  Basic metabolic panel     Status: Abnormal   Collection Time: 09/24/18  8:50 PM  Result Value Ref Range   Sodium 138 135 - 145 mmol/L   Potassium 4.0 3.5 - 5.1 mmol/L   Chloride 104 98 - 111 mmol/L   CO2 21 (L) 22 - 32 mmol/L   Glucose, Bld 165 (H) 70 - 99 mg/dL   BUN 16 8 - 23 mg/dL   Creatinine, Ser 0.80 0.61 - 1.24 mg/dL   Calcium 9.4 8.9 - 10.3 mg/dL   GFR calc non Af Amer >60 >60 mL/min   GFR calc Af Amer >60 >60 mL/min   Anion gap 13 5 - 15    Comment: Performed at [facility], [place] [address] [place], [place] [address]  CBC     Status: Abnormal   Collection Time: 09/24/18  8:50 PM  Result Value Ref Range   WBC 9.0 4.0 - 10.5 K/uL   RBC 4.27 4.22 - 5.81 MIL/uL   Hemoglobin 13.5 13.0 - 17.0 g/dL   HCT 38.2  (L) 39.0 - 52.0 %   MCV 89.5 80.0 - 100.0 fL   MCH 31.6 26.0 - 34.0 pg   MCHC 35.3 30.0 - 36.0 g/dL   RDW 13.0 11.5 - 15.5 %   Platelets 175 150 - 400 K/uL   nRBC 0.0 0.0 - 0.2 %    Comment: Performed at [facility], [place] [address], [place], [place] [address]  I-Stat Troponin, ED (not at [facility])     Status: None   Collection Time: 09/24/18  8:58 PM  Result Value Ref Range   Troponin i, poc 0.02 0.00 - 0.08 ng/mL   Comment 3            Comment: Due to the release kinetics of cTnI, a negative result within the first hours of the onset of symptoms does not rule out myocardial infarction with certainty. If myocardial infarction is still suspected, repeat the test at appropriate intervals.   Hepatic function panel     Status: Abnormal   Collection Time: 09/24/18  8:58 PM  Result Value Ref Range   Total  Protein 6.7 6.5 - 8.1 g/dL   Albumin 4.0 3.5 - 5.0 g/dL   AST 32 15 - 41 U/L   ALT 17 0 - 44 U/L   Alkaline Phosphatase 96 38 - 126 U/L   Total Bilirubin 1.4 (H) 0.3 - 1.2 mg/dL   Bilirubin, Direct 0.4 (H) 0.0 - 0.2 mg/dL   Indirect Bilirubin 1.0 (H) 0.3 - 0.9 mg/dL    Comment: Performed at [facility] [address] [place], [place] [address]  Lipase, blood     Status: None   Collection Time: 09/24/18  8:58 PM  Result Value Ref Range   Lipase 23 11 - 51 U/L    Comment: Performed at [facility] [address], [place], [place] [address]  D-dimer, quantitative (not at [facility])     Status: None   Collection Time: 09/24/18  9:00 PM  Result Value Ref Range   D-Dimer, Quant 0.48 0.00 - 0.50 ug/mL-FEU    Comment: (NOTE) At the manufacturer cut-off of 0.50 ug/mL FEU, this assay has been documented to exclude PE with a sensitivity and negative predictive value of 97 to 99%.  At this time, this assay has not been approved by the FDA to exclude DVT/VTE. Results should be correlated with clinical presentation. Performed at [facility], [place] [address],  [place], [place] [address]   I-Stat Creatinine, ED (not at [facility])     Status: Abnormal   Collection Time: 09/24/18  9:02 PM  Result Value Ref Range   Creatinine, Ser 0.60 (L) 0.61 - 1.24 mg/dL  Lactic acid, plasma     Status: None   Collection Time: 09/25/18 12:10 AM  Result Value Ref Range   Lactic Acid, Venous 1.4 0.5 - 1.9 mmol/L    Comment: Performed at [facility] [address] [place], [place] [address]  I-stat troponin, ED     Status: None   Collection Time: 09/25/18 12:13 AM  Result Value Ref Range   Troponin i, poc 0.04 0.00 - 0.08 ng/mL   Comment 3            Comment: Due to the release kinetics of cTnI, a negative result within the first hours of the onset of symptoms does not rule out myocardial infarction with certainty. If myocardial infarction is still suspected, repeat the test at appropriate intervals.     Dg Chest 2 View  Result Date: 09/24/2018 CLINICAL DATA:  Chest pain.  Nausea, vomiting. EXAM: CHEST - 2 VIEW COMPARISON:  10/26/2017 FINDINGS: Large hiatal hernia. Heart is borderline in size. No confluent airspace opacity, effusion or edema. No acute bony abnormality. IMPRESSION: Large hiatal hernia.  Borderline heart size.  No active disease. Electronically Signed   By: Rolm Baptise M.D.   On: 09/24/2018 21:17   Ct Abdomen Pelvis W Contrast  Result Date: 09/24/2018 CLINICAL DATA:  Abdominal pain EXAM: CT ABDOMEN AND PELVIS WITH CONTRAST TECHNIQUE: Multidetector CT imaging of the abdomen and pelvis was performed using the standard protocol following bolus administration of intravenous contrast. CONTRAST:  147mL OMNIPAQUE 300 COMPARISON:  None. FINDINGS: Lower chest: Lung bases are free of acute infiltrate or sizable effusion. Coronary calcifications are identified. Moderate sized hiatal hernia is noted. Hepatobiliary: Gallbladder is well distended. The liver is within normal limits. No definitive common bile duct stone is seen. Pancreas: The pancreas is  predominately atrophic. Spleen: Spleen is within normal limits. Adrenals/Urinary Tract: Adrenal glands are unremarkable. Kidneys demonstrate a normal  enhancement pattern bilaterally. Normal excretion of contrast is seen. Bladder is well distended. Stomach/Bowel: Fecal material is noted throughout the colon consistent with mild constipation. The appendix is within normal limits. No small bowel inflammatory changes are seen. The stomach demonstrates a hiatal hernia. Vascular/Lymphatic: Aortic atherosclerosis. No enlarged abdominal or pelvic lymph nodes. Reproductive: Prostate is unremarkable. Other: No abdominal wall hernia or abnormality. No abdominopelvic ascites. Musculoskeletal: Degenerative changes of the lumbar spine are noted. No acute compression deformity is seen. Scoliosis concave to the left is noted in the mid lumbar spine. IMPRESSION: Changes of constipation.  No other focal abnormality is noted Electronically Signed   By: Inez Catalina M.D.   On: 09/24/2018 22:48   US Abdomen Limited Ruq  Result Date: 09/25/2018 CLINICAL DATA:  Initial evaluation for acute right upper quadrant abdominal pain. EXAM: ULTRASOUND ABDOMEN LIMITED RIGHT UPPER QUADRANT COMPARISON:  Prior CT from 09/24/2018 FINDINGS: Gallbladder: Internal sludge seen within the gallbladder without frank shadowing echogenic stones. Gallbladder wall mildly thickened up to 5 mm. No free pericholecystic fluid. No sonographic Murphy sign indicated by sonographer. Common bile duct: Diameter: 8 mm Liver: No focal lesion identified. Within normal limits in parenchymal echogenicity. Portal vein is patent on color Doppler imaging with normal direction of blood flow towards the liver. IMPRESSION: 1. Gallbladder sludge with mild gallbladder wall thickening. No cholelithiasis or additional sonographic features to suggest acute cholecystitis. 2. No biliary dilatation. Electronically Signed   By: Jeannine Boga M.D.   On: 09/25/2018 01:12    Review  of Systems  Constitutional: Negative for chills and fever.  HENT: Negative.   Eyes: Negative.   Respiratory: Negative.   Cardiovascular: Positive for chest pain.  Gastrointestinal: Positive for abdominal pain and nausea.  Genitourinary: Negative.   Musculoskeletal: Negative.   Skin: Negative.   Neurological: Negative.   Endo/Heme/Allergies: Negative.   Psychiatric/Behavioral: Negative.    Blood pressure (!) 169/95, pulse 73, temperature 98.2 F (36.8 C), temperature source Oral, resp. rate 16, SpO2 97 %. Physical Exam  Constitutional: He appears well-developed and well-nourished.  HENT:  Head: Normocephalic.  Right Ear: External ear normal.  Mouth/Throat: Oropharynx is clear and moist.  Eyes: Pupils are equal, round, and reactive to light. No scleral icterus.  Neck: No tracheal deviation present. No thyromegaly present.  Cardiovascular: Normal rate, regular rhythm and normal heart sounds.  Respiratory: Effort normal and breath sounds normal. No respiratory distress. He has no wheezes.  GI: Soft. He exhibits no distension. There is abdominal tenderness. There is no rebound and no guarding.  Some right upper quadrant tenderness without generalized tenderness, no peritonitis  Musculoskeletal: Normal range of motion.  Neurological: He is alert.  Agitated and very fidgety    Assessment/Plan: Cholecystitis - agree with medical admission, IV antibiotics, bowel rest.  Will reevaluate for possible cholecystectomy later today.  I discussed the plan with him.  Zenovia Jarred 09/25/2018, 2:31 AM

## 2018-09-25 NOTE — Consult Note (Addendum)
[facility] Consult  Referring Provider: Dr.Jessica Vann/[facility] Primary Care Physician:  Aletha Halim., PA-C Primary Gastroenterologist: [facility]  Reason for Consultation:  Possible CBD stone  HPI: Riley Eaton is a 78 y.o. male with complains of nausea and vomiting.  Patient has received several doses of Haldol and Dilaudid, appeared somnolent and restless and most of the history is obtained from his wife present at bedside in ER.  As per her, he developed generalized abdominal pain starting at 11 AM yesterday associated with several episodes of nausea and bilious vomiting which prompted them to proceed to the ER around 8 PM yesterday evening.  No fever reported at home but patient was complaining of chills.  Patient is on narcotics for back pain, hydrocodone/acetaminophen 5/325 1 tablet every 6 hours as needed, oxycodone 80 mg every 12 hours and reports severe constipation. Last bowel movement was 2-3 weeks ago.  Occasionally he takes a laxative as needed.  There is no history of change in appetite prior to this episode, no history of unintentional weight loss, difficulty swallowing or pain on swallowing. Last colonoscopy was performed in 2013, poor prep was noted, subsequent barium enema was unremarkable.   Past Medical History:  Diagnosis Date  . Chronic back pain   . 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 Coronary artery calcification seen on CT scan   . Nerve pain   . Thoracic aortic aneurysm [facility]     Past Surgical History:  Procedure Laterality Date  . BACK SURGERY     09-2016  . back surgey      Prior to Admission medications   Medication Sig Start Date End Date Taking? Authorizing Provider  allopurinol (ZYLOPRIM) 100 MG tablet TAKE 1 TABLET BY MOUTH EVERY DAY Patient taking differently: Take 100 mg by mouth daily.  09/15/18  Yes Kathrynn Ducking, MD  bisacodyl (DULCOLAX) 10 MG suppository Place 1 suppository (10 mg total) rectally as  needed for moderate constipation. 10/26/17  Yes Varney Biles, MD  calcium-vitamin D (OSCAL WITH D) 500-200 MG-UNIT tablet Take 1 tablet by mouth daily with breakfast.   Yes [provider]  Diphenhydramine-APAP, sleep, (TYLENOL PM EXTRA STRENGTH PO) Take 1 tablet by mouth at bedtime.    Yes [provider]  DULoxetine (CYMBALTA) 60 MG capsule TAKE 1 CAPSULE BY MOUTH TWICE A DAY Patient taking differently: Take 60 mg by mouth 2 (two) times daily.  05/05/18  Yes Kathrynn Ducking, MD  HYDROcodone-acetaminophen (NORCO/VICODIN) 5-325 MG tablet Take 1 tablet by mouth every 6 (six) hours as needed for moderate pain. Must last 28 days. 09/21/18  Yes Kathrynn Ducking, MD  oxyCODONE (OXYCONTIN) 80 mg 12 hr tablet Take 1 tablet (80 mg total) by mouth every 12 (twelve) hours. 09/21/18  Yes Kathrynn Ducking, MD  pregabalin (LYRICA) 200 MG capsule TAKE 1 CAPSULE BY MOUTH THREE TIMES A DAY Patient taking differently: Take 200 mg by mouth 3 (three) times daily.  09/23/18  Yes Kathrynn Ducking, MD  promethazine (PHENERGAN) 25 MG tablet Take 1 tablet by mouth every 4 (four) hours as needed for nausea or vomiting.  08/02/15  Yes [provider]  tamsulosin (FLOMAX) 0.4 MG CAPS capsule Take 0.4 mg by mouth daily.  08/29/15  Yes [provider]  traZODone (DESYREL) 50 MG tablet Take 25 mg by mouth at bedtime.    Yes [provider]  triamcinolone ointment (KENALOG) 0.1 % Apply 1 application topically 2 (two) times daily as  needed for rash. 10/15/17  Yes [provider]  doxycycline (VIBRAMYCIN) 100 MG capsule Take 1 capsule (100 mg total) by mouth 2 (two) times daily. Patient not taking: Reported on 09/24/2018 10/26/17   Varney Biles, MD    Current Facility-Administered Medications  Medication Dose Route Frequency Provider Last Rate Last Dose  . 0.9 %  sodium chloride infusion   Intravenous Continuous Rise Patience, MD 100 mL/hr at 09/25/18 4034    .  acetaminophen (TYLENOL) tablet 650 mg  650 mg Oral Q6H PRN Rise Patience, MD       Or  . acetaminophen (TYLENOL) suppository 650 mg  650 mg Rectal Q6H PRN Rise Patience, MD      . HYDROmorphone (DILAUDID) injection 2 mg  2 mg Intravenous Q2H PRN Rise Patience, MD   2 mg at 09/25/18 1031  . ondansetron (ZOFRAN) tablet 4 mg  4 mg Oral Q6H PRN Rise Patience, MD       Or  . ondansetron [facility]) injection 4 mg  4 mg Intravenous Q6H PRN Rise Patience, MD      . piperacillin-tazobactam (ZOSYN) IVPB 3.375 g  3.375 g Intravenous Q8H Rise Patience, MD       Current Outpatient Medications  Medication Sig Dispense Refill  . allopurinol (ZYLOPRIM) 100 MG tablet TAKE 1 TABLET BY MOUTH EVERY DAY (Patient taking differently: Take 100 mg by mouth daily. ) 90 tablet 0  . bisacodyl (DULCOLAX) 10 MG suppository Place 1 suppository (10 mg total) rectally as needed for moderate constipation. 12 suppository 0  . calcium-vitamin D (OSCAL WITH D) 500-200 MG-UNIT tablet Take 1 tablet by mouth daily with breakfast.    . Diphenhydramine-APAP, sleep, (TYLENOL PM EXTRA STRENGTH PO) Take 1 tablet by mouth at bedtime.     . DULoxetine (CYMBALTA) 60 MG capsule TAKE 1 CAPSULE BY MOUTH TWICE A DAY (Patient taking differently: Take 60 mg by mouth 2 (two) times daily. ) 180 capsule 1  . HYDROcodone-acetaminophen (NORCO/VICODIN) 5-325 MG tablet Take 1 tablet by mouth every 6 (six) hours as needed for moderate pain. Must last 28 days. 60 tablet 0  . oxyCODONE (OXYCONTIN) 80 mg 12 hr tablet Take 1 tablet (80 mg total) by mouth every 12 (twelve) hours. 60 tablet 0  . pregabalin (LYRICA) 200 MG capsule TAKE 1 CAPSULE BY MOUTH THREE TIMES A DAY (Patient taking differently: Take 200 mg by mouth 3 (three) times daily. ) 270 capsule 1  . promethazine (PHENERGAN) 25 MG tablet Take 1 tablet by mouth every 4 (four) hours as needed for nausea or vomiting.   6  . tamsulosin (FLOMAX) 0.4 MG CAPS capsule  Take 0.4 mg by mouth daily.     . traZODone (DESYREL) 50 MG tablet Take 25 mg by mouth at bedtime.     . triamcinolone ointment (KENALOG) 0.1 % Apply 1 application topically 2 (two) times daily as needed for rash.  2  . doxycycline (VIBRAMYCIN) 100 MG capsule Take 1 capsule (100 mg total) by mouth 2 (two) times daily. (Patient not taking: Reported on 09/24/2018) 14 capsule 0    Allergies as of 09/24/2018  . (No Known Allergies)    Family History  Problem Relation Age of Onset  . Cancer Mother   . Heart attack Father     Social History   Socioeconomic History  . Marital status: Married    Spouse name: Not on file  . Number of children: 3  . Years  of education: 2  . Highest education level: Not on file  Occupational History  . Not on file  Social Needs  . Financial resource strain: Not on file  . Food insecurity:    Worry: Not on file    Inability: Not on file  . Transportation needs:    Medical: Not on file    Non-medical: Not on file  Tobacco Use  . Smoking status: Never Smoker  . Smokeless tobacco: Never Used  Substance and Sexual Activity  . Alcohol use: No  . Drug use: No  . Sexual activity: Not on file  Lifestyle  . Physical activity:    Days per week: Not on file    Minutes per session: Not on file  . Stress: Not on file  Relationships  . Social connections:    Talks on phone: Not on file    Gets together: Not on file    Attends religious service: Not on file    Active member of club or organization: Not on file    Attends meetings of clubs or organizations: Not on file    Relationship status: Not on file  . Intimate partner violence:    Fear of current or ex partner: Not on file    Emotionally abused: Not on file    Physically abused: Not on file    Forced sexual activity: Not on file  Other Topics Concern  . Not on file  Social History Narrative   Lives with wife   Caffeine use: Drinks 6 cups coffee per day   Right handed     Review of  Systems: Positive for: GI: Described in detail in HPI.    Gen: chills, Denies any fever, rigors, night sweats, anorexia, fatigue, weakness, malaise, involuntary weight loss, and sleep disorder CV: Denies chest pain, angina, palpitations, syncope, orthopnea, PND, peripheral edema, and claudication. Resp: Denies dyspnea, cough, sputum, wheezing, coughing up blood. GU : Denies urinary burning, blood in urine, urinary frequency, urinary hesitancy, nocturnal urination, and urinary incontinence. MS: back pain, Denies joint pain or swelling.  Denies muscle weakness, cramps, atrophy.  Derm: Denies rash, itching, oral ulcerations, hives, unhealing ulcers.  Psych: Denies depression, anxiety, memory loss, suicidal ideation, hallucinations,  and confusion. Heme: Denies bruising, bleeding, and enlarged lymph nodes. Neuro:  Denies any headaches, dizziness, paresthesias. Endo:  Denies any problems with DM, thyroid, adrenal function.  Physical Exam: Vital signs in last 24 hours: Temp:  [98.2 F (36.8 C)] 98.2 F (36.8 C) (02/13 2036) Pulse Rate:  [63-105] 76 (02/14 0915) Resp:  [13-25] 17 (02/14 1015) BP: (110-169)/(58-96) 155/96 (02/14 1015) SpO2:  [90 %-100 %] 93 % (02/14 0915) Weight:  [71.7 kg] 71.7 kg (02/14 0500)    General:   Restless, appears to be in pain Head:  Normocephalic and atraumatic. Eyes:  Sclera clear, no icterus.   Conjunctiva pink. Ears:  Normal auditory acuity. Nose:  No deformity, discharge,  or lesions. Mouth:  No deformity or lesions.  Oropharynx pink & moist. Neck:  Supple; no masses or thyromegaly. Lungs:  Clear throughout to auscultation.   No wheezes, crackles, or rhonchi. No acute distress. Heart:  Regular rate and rhythm; no murmurs, clicks, rubs,  or gallops. Extremities:  Without clubbing or edema. Neurologic: awake, follows commands, orientation could not be assessed as patient had recently received Haldol and Dilaudid, grossly normal neurologically. Skin:   Intact without significant lesions or rashes. Psych:  Alert and cooperative. Normal mood and affect. Abdomen:  Soft, generalized  abdominal tenderness and nondistended. No masses, hepatosplenomegaly or hernias noted. Normal bowel sounds, without guarding, and without rebound.         Lab Results: Recent Labs    09/24/18 2050 09/25/18 0555  WBC 9.0 11.5*  HGB 13.5 13.6  HCT 38.2* 38.6*  PLT 175 142*   BMET Recent Labs    09/24/18 2050 09/24/18 2102 09/25/18 0555  NA 138  --  141  K 4.0  --  3.4*  CL 104  --  109  CO2 21*  --  17*  GLUCOSE 165*  --  131*  BUN 16  --  18  CREATININE 0.80 0.60* 1.13  CALCIUM 9.4  --  9.6   LFT Recent Labs    09/25/18 0555  PROT 7.0  ALBUMIN 4.2  AST 34  ALT 17  ALKPHOS 94  BILITOT 1.8*  BILIDIR 0.4*  IBILI 1.4*   PT/INR No results for input(s): LABPROT, INR in the last 72 hours.  Studies/Results: Dg Chest 2 View  Result Date: 09/24/2018 CLINICAL DATA:  Chest pain.  Nausea, vomiting. EXAM: CHEST - 2 VIEW COMPARISON:  10/26/2017 FINDINGS: Large hiatal hernia. Heart is borderline in size. No confluent airspace opacity, effusion or edema. No acute bony abnormality. IMPRESSION: Large hiatal hernia.  Borderline heart size.  No active disease. Electronically Signed   By: Rolm Baptise M.D.   On: 09/24/2018 21:17   Ct Abdomen Pelvis W Contrast  Result Date: 09/24/2018 CLINICAL DATA:  Abdominal pain EXAM: CT ABDOMEN AND PELVIS WITH CONTRAST TECHNIQUE: Multidetector CT imaging of the abdomen and pelvis was performed using the standard protocol following bolus administration of intravenous contrast. CONTRAST:  166mL OMNIPAQUE 300 COMPARISON:  None. FINDINGS: Lower chest: Lung bases are free of acute infiltrate or sizable effusion. Coronary calcifications are identified. Moderate sized hiatal hernia is noted. Hepatobiliary: Gallbladder is well distended. The liver is within normal limits. No definitive common bile duct stone is seen. Pancreas:  The pancreas is predominately atrophic. Spleen: Spleen is within normal limits. Adrenals/Urinary Tract: Adrenal glands are unremarkable. Kidneys demonstrate a normal enhancement pattern bilaterally. Normal excretion of contrast is seen. Bladder is well distended. Stomach/Bowel: Fecal material is noted throughout the colon consistent with mild constipation. The appendix is within normal limits. No small bowel inflammatory changes are seen. The stomach demonstrates a hiatal hernia. Vascular/Lymphatic: Aortic atherosclerosis. No enlarged abdominal or pelvic lymph nodes. Reproductive: Prostate is unremarkable. Other: No abdominal wall hernia or abnormality. No abdominopelvic ascites. Musculoskeletal: Degenerative changes of the lumbar spine are noted. No acute compression deformity is seen. Scoliosis concave to the left is noted in the mid lumbar spine. IMPRESSION: Changes of constipation.  No other focal abnormality is noted Electronically Signed   By: Inez Catalina M.D.   On: 09/24/2018 22:48   Ct Angio Chest/abd/pel For Dissection W And/or W/wo  Result Date: 09/25/2018 CLINICAL DATA:  Chest pain with acute aortic syndrome suspected. Patient already received contrast tonight but has normal renal function, is not diabetic, and is to be hydrated. A reduced dose was used. EXAM: CT ANGIOGRAPHY CHEST, ABDOMEN AND PELVIS TECHNIQUE: Multidetector CT imaging through the chest, abdomen and pelvis was performed using the standard protocol during bolus administration of intravenous contrast. Multiplanar reconstructed images and MIPs were obtained and reviewed to evaluate the vascular anatomy. CONTRAST:  63mL OMNIPAQUE IOHEXOL 300 MG/ML  SOLN COMPARISON:  Abdomen and pelvis CT from earlier today. FINDINGS: CTA CHEST FINDINGS Cardiovascular: Noncontrast phase shows no intramural hematoma of the aorta.  No cardiomegaly or pericardial effusion. No aortic dissection inflammatory wall thickening on postcontrast imaging. Partial  coverage of the right proximal subclavian shows likely high-grade stenosis. There is limited opacification of the pulmonary arteries with no central filling defect noted. Mediastinum/Nodes: Negative for adenopathy.  Large hiatal hernia. Lungs/Pleura: Central airways are clear. There is no edema, consolidation, effusion, or pneumothorax. Musculoskeletal: No acute or aggressive finding. Spinal degeneration and dextroscoliosis. Review of the MIP images confirms the above findings. CTA ABDOMEN AND PELVIS FINDINGS VASCULAR Aorta: Atheromatous plaque.  No aneurysm or dissection. Celiac: Patent without evidence of aneurysm, dissection, vasculitis or significant stenosis. SMA: Minimal atheromatous changes. No branch occlusion, beading, or flow limiting stenosis. Renals: Mild calcified plaque at the renal artery ostia. No stenosis or aneurysm seen. IMA: Patent Inflow: Atherosclerosis Veins: Negative Review of the MIP images confirms the above findings. NON-VASCULAR Hepatobiliary: Mild altered perfusion about the gallbladder fossa better evaluated on prior.Full gallbladder which was evaluated by right upper quadrant ultrasound earlier. There is a 10 mm high-density at the distal common bile duct. The common bile duct measures 1 cm in diameter. Pancreas: Prominent fatty atrophy. Spleen: Prominent craniocaudal span but no thickening. Adrenals/Urinary Tract: Negative adrenals. No hydronephrosis or stone. Contrast in the bladder from prior study showing mild trabeculation. Stomach/Bowel: Formed stool throughout the tortuous colon. No obstruction or bowel inflammation is seen. Hiatal hernia as noted above. Lymphatic: No mass or adenopathy. Reproductive:Enlarged prostate Other: No ascites or pneumoperitoneum. Musculoskeletal: Advanced spinal degeneration with dextroscoliosis. 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 Review of the MIP images confirms the above findings. IMPRESSION: 1. Probable 1 cm distal CBD stone 2. No  findings of acute aortic syndrome. 3. 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 4. Generalized stool retention. Electronically Signed   By: Monte Fantasia M.D.   On: 09/25/2018 04:43   US Abdomen Limited Ruq  Result Date: 09/25/2018 CLINICAL DATA:  Initial evaluation for acute right upper quadrant abdominal pain. EXAM: ULTRASOUND ABDOMEN LIMITED RIGHT UPPER QUADRANT COMPARISON:  Prior CT from 09/24/2018 FINDINGS: Gallbladder: Internal sludge seen within the gallbladder without frank shadowing echogenic stones. Gallbladder wall mildly thickened up to 5 mm. No free pericholecystic fluid. No sonographic Murphy sign indicated by sonographer. Common bile duct: Diameter: 8 mm Liver: No focal lesion identified. Within normal limits in parenchymal echogenicity. Portal vein is patent on color Doppler imaging with normal direction of blood flow towards the liver. IMPRESSION: 1. Gallbladder sludge with mild gallbladder wall thickening. No cholelithiasis or additional sonographic features to suggest acute cholecystitis. 2. No biliary dilatation. Electronically Signed   By: Jeannine Boga M.D.   On: 09/25/2018 01:12    Impression: CT abdomen and pelvis with contrast from 09/24/2018 showed no definite common bile duct stone, atrophic pancreas  This was followed by an ultrasound of the abdomen performed on 09/25/2018 which showed sludge within gallbladder, without frank shadowing, gallbladder wall mildly thickened up to 5 mm, no pericholecystic fluid, CBD normal at 8 mm,biliary dilatation.  CT angio of chest/abdomen/pelvis performed for possible dissection at 4:22 AM showed a 10 mm high density at distal common bile duct, CBD measuring 1 cm,probable 1 cm distal CBD stone   Changes of constipation   Plan: Except for minimally elevated T bili of 1.4/1.8, rest of the LFTs( AST/AST/ALP) are normal including lipase Recommend MRCP to evaluate biliary tree, if CBD  stone is confirmed will proceed with an ERCP Currently on normal saline at 100 mL an hour, has  received 4 mg IV Dilaudid, 4 mg IV Haldol, 6 mg IV morphine and is on Zofran and Compazine as needed for nausea/vomiting  Patient needs treatment for constipation, when he is not nothing by mouth, recommend starting laxatives   LOS: 0 days   Ronnette Juniper, MD 09/25/2018, 10:47 AM  Pager [phone] If no answer or after 5 PM call [phone]

## 2018-09-25 NOTE — ED Notes (Addendum)
Pt still continues to be agitated and restless. Pt stating he is hurting. Pt continues to constantly move different positions every second. Pt looks very uncomfortable and is making grunting and straining sounds. Pt able to lay in bed for 4 seconds at one point but then got back up and has continued to move around in bed and room.  Tech at bedside

## 2018-09-25 NOTE — ED Notes (Signed)
Pt now asleep, finally resting.

## 2018-09-25 NOTE — Op Note (Signed)
ERCP was performed for distal CBD stone noted on MRCP  Findings: Fresh blood was noted in the oropharynx and via both the nose during passage of the scope, could be related to trauma to the adenoids. It was resolved with packing of the nose, no further bleeding was noted during withdrawal of the scope.   The ampulla appeared unremarkable. The pancreatic duct was cannulated on several locations, a plastic 4 French 3 cm stent was left in the pancreatic duct subsequently. Cannulation of CBD was challenging,a small precut sphincterotomy was performed, with severe bowing of the tome, deep cannulation was achieved. Cholangiogram revealed a small filling defect in distal common bile duct. Sphincterotomy was performed and a 9-12 mm balloon was used to sweep the CBD several times. A small stone was removed. No bleeding noted post sphincterotomy.  Recommendations: Surgical evaluation for cholecystectomy Past diet as tolerated Abdominal x-ray in 2 weeks, if pancreatic stent has not fallen off spontaneously, will need EGD for removal of pancreatic stent. Patient has severe constipation, noted on imaging and has not had a bowel movement in 2-3 weeks. Recommend oral as well as rectal laxatives.  Ronnette Juniper, M.D.

## 2018-09-25 NOTE — ED Notes (Signed)
Patient transported to Ultrasound 

## 2018-09-25 NOTE — ED Notes (Addendum)
Pt returned from ultrasound very restless and agitated. Pt stating his arms and his body hurt. Pt unable to stay in one position for more than one second. Pt continuing to move around in bed and getting out of bed. MD notified. Pt removing all of his leads and electrodes. Unable to obtain BP. Tech at bedside

## 2018-09-25 NOTE — ED Notes (Signed)
Pt remains restless and agitated. Will not lay still. Continues to move in room. Unable to obtain vital signs. Was able to complete CT scan.

## 2018-09-25 NOTE — Transfer of Care (Signed)
Immediate Anesthesia Transfer of Care Note  Patient: Riley Eaton  Procedure(s) Performed: ENDOSCOPIC RETROGRADE CHOLANGIOPANCREATOGRAPHY (ERCP) WITH PROPOFOL (N/A ) PANCREATIC STENT PLACEMENT REMOVAL OF STONES SPHINCTEROTOMY  Patient Location: PACU  Anesthesia Type:General  Level of Consciousness: drowsy and patient cooperative  Airway & Oxygen Therapy: Patient Spontanous Breathing and Patient connected to face mask oxygen  Post-op Assessment: Report given to RN and Post -op Vital signs reviewed and stable  Post vital signs: Reviewed and stable  Last Vitals:  Vitals Value Taken Time  BP    Temp    Pulse    Resp    SpO2      Last Pain:  Vitals:   09/25/18 1520  TempSrc: Oral  PainSc:          Complications: No apparent anesthesia complications

## 2018-09-25 NOTE — Interval H&P Note (Signed)
History and Physical Interval Note: 78/male with distal CBD stone noted on MRCP for an ERCP.  09/25/2018 3:41 PM  Riley Eaton  has presented today for ERCP, with the diagnosis of CBD stone  The various methods of treatment have been discussed with the patient and family. After consideration of risks, benefits and other options for treatment, the patient has consented to  Procedure(s): ENDOSCOPIC RETROGRADE CHOLANGIOPANCREATOGRAPHY (ERCP) WITH PROPOFOL (N/A) as a surgical intervention .  The patient's history has been reviewed, patient examined, no change in status, stable for surgery.  I have reviewed the patient's chart and labs.  Questions were answered to the patient's satisfaction.     Ronnette Juniper

## 2018-09-25 NOTE — Procedures (Signed)
Procedure: Cystoscopy with dilation of urethral stricture and insertion of Foley catheter.  Preop diagnosis: Traumatic Foley catheter placement.  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  Surgeon: Dr. Irine Seal.  Anesthesia: General.  Specimen: None.  Drains: 18 [place] council Foley catheter.  EBL: Minimal.  Complications: None.  Indications: Riley Eaton is a 78 year old male who was found to be in retention on imaging for abdominal pain which demonstrated an obstructing common bile duct stone.  He is undergone ERCP and I was asked to come place a Foley catheter after the procedure as attempts at placing the Foley catheter prior to the procedure were unsuccessful and associated with periurethral trauma as evidenced by blood at urethral meatus.  Procedure: He was intubated in the endoscopy suite.  His penis was prepped with Betadine solution he was draped with sterile towels.  His urethra was instilled with lubricating jelly and an initial attempt passage of a 59 French coud Foley was attempted without success.  I then passed the flexible cystoscope which revealed a normal anterior urethra.  In the bulbar urethra there was a false passage with the true lumen to the right with evidence of a short segment stricture.  I was able to break through the stricture with the cystoscope.  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  The bladder was entered but not well visualized because of turbid urine.  A sensor wire was passed through the cystoscope into the bladder and the cystoscope was removed.  An 1 [place] [employer] tip catheter was passed over the wire and successfully advanced into the bladder although there was resistance at the stricture.  The balloon was filled with 10 cc of sterile fluid and the wire was removed.  The cath was placed to straight drainage with return of slightly bloody urine.  His anesthetic was reversed and he was  moved recovery room in stable condition.  There were no complications.

## 2018-09-25 NOTE — ED Notes (Signed)
Pt now waking up, wife at bedside. Pt is still moderately confused. Pt staying in bed and following commands. VSS. Pt denies pain at this time.

## 2018-09-25 NOTE — ED Notes (Signed)
Bladder scan completed 727 in bladder. Admitting at bedside. Dr Eliseo Squires gave verbal order for foley placement. Pt appears very uncomfortable.

## 2018-09-25 NOTE — ED Notes (Signed)
Dr. Eliseo Squires paged to [place], RN-paged by Levada Dy

## 2018-09-25 NOTE — ED Notes (Signed)
Surgery at bedside.

## 2018-09-25 NOTE — ED Notes (Signed)
Pt still in extreme pain after being given dilaudid. Admitting MD notified.

## 2018-09-25 NOTE — Anesthesia Procedure Notes (Signed)
Procedure Name: Intubation Date/Time: 09/25/2018 3:59 PM Performed by: Lance Coon, CRNA Pre-anesthesia Checklist: Patient identified, Emergency Drugs available, Suction available, Patient being monitored and Timeout performed Patient Re-evaluated:Patient Re-evaluated prior to induction Oxygen Delivery Method: Circle system utilized Preoxygenation: Pre-oxygenation with 100% oxygen Induction Type: IV induction Ventilation: Mask ventilation without difficulty Laryngoscope Size: Miller and 3 Grade View: Grade I Tube type: Oral Tube size: 7.5 mm Number of attempts: 1 Airway Equipment and Method: Stylet Placement Confirmation: ETT inserted through vocal cords under direct vision,  positive ETCO2 and breath sounds checked- equal and bilateral Secured at: 22 cm Tube secured with: Tape Dental Injury: Teeth and Oropharynx as per pre-operative assessment

## 2018-09-25 NOTE — ED Provider Notes (Signed)
Asked by nurse to see patient regarding abnormal behavior.  Nurse reports this has been going on off and on during his stay but is getting worse. Reportedly it was happening at home as well.  He has had multiple medications  On arrival he is picking at his arms and is restless and moving all over the place he then kneels on the bed and then gets up on all 4s.  He is talking about nausea.  He is moving around and reports he can't stay still.    ncat RRR [id number] CTAB NABS Motor Restlessness   Based on ultrasound results case was discussed with Dr. Grandville Silos via his scrub nurse as he was in the OR, [facility]CS will consult     Case admitted to Dr. Briant Cedar, Drenda Sobecki, MD 09/25/18 [id number]

## 2018-09-25 NOTE — ED Notes (Signed)
Pt continues to get in and out of bed, making grunting and straining sounds. Tech at bedside

## 2018-09-25 NOTE — ED Notes (Signed)
EDP at bedside  

## 2018-09-25 NOTE — Progress Notes (Signed)
Admitted after midnight with abdominal pain. Workup reveals CBD stone without cholecystitis. Total bili 1.4. pain difficult to control. CT negative for discection.  General surgery recommend GI consult who recommend MRCP t evaluate biliary tree and plan to proceed to ERCP if stone confirmed.   PE Somewhat pale restless mild distress CV: rrr no MGR no LE edema Resp: clear BS bilaterally no wheeze Abd: soft diffuse tenderness throughout   plan: MRCP Pain control Supportive therapy   Dyanne Carrel, NP

## 2018-09-25 NOTE — ED Notes (Signed)
Pt taken to MRI  

## 2018-09-25 NOTE — Brief Op Note (Signed)
09/24/2018 - 09/25/2018  5:34 PM  PATIENT:  Riley Eaton  78 y.o. male  PRE-OPERATIVE DIAGNOSIS:  CBD stone  POST-OPERATIVE DIAGNOSIS:  pancreatic stent placement, SPHINCTEROTOMY, balloon extraction  PROCEDURE:  Procedure(s): ENDOSCOPIC RETROGRADE CHOLANGIOPANCREATOGRAPHY (ERCP) WITH PROPOFOL (N/A) PANCREATIC STENT PLACEMENT REMOVAL OF STONES SPHINCTEROTOMY  SURGEON:  Surgeon(s) and Role:    Ronnette Juniper, MD - Primary  PHYSICIAN ASSISTANT:   ASSISTANTS: Wende Bushy, Tech   ANESTHESIA:   MAC  EBL:  Minimal  BLOOD ADMINISTERED:none  DRAINS: none   LOCAL MEDICATIONS USED:  NONE  SPECIMEN:  No Specimen  DISPOSITION OF SPECIMEN:  N/A  COUNTS:  YES  TOURNIQUET:  * No tourniquets in log *  DICTATION: .Dragon Dictation  PLAN OF CARE: Admit to inpatient   PATIENT DISPOSITION:  PACU - hemodynamically stable.   Delay start of Pharmacological VTE agent (>24hrs) due to surgical blood loss or risk of bleeding: no

## 2018-09-25 NOTE — ED Notes (Signed)
Pt continues to be restless and move between bed and chair constantly

## 2018-09-25 NOTE — ED Notes (Signed)
Pt is doing better since benadryl, pt is not agitated. Pt is resting in bed waiting for ultrasound

## 2018-09-25 NOTE — Consult Note (Signed)
Subjective: CC: Urinary retention.  Hx: Mr. Burgo is a 78 yo male who I was asked to see by Dr. Norva Pavlov for foley placement.  The patient is admitted for an obstructing CBD stone and was found to imaging to be in retention with a PVR of 735ml.  GU history is not available as patient is intubated.   ROS:  Review of Systems  Unable to perform ROS: Intubated    No Known Allergies  Past Medical History:  Diagnosis Date  . Chronic back pain   . 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 Coronary artery calcification seen on CT scan   . Nerve pain   . Thoracic aortic aneurysm [facility])     Past Surgical History:  Procedure Laterality Date  . BACK SURGERY     09-2016  . back surgey      Social History   Socioeconomic History  . Marital status: Married    Spouse name: Not on file  . Number of children: 3  . Years of education: 18  . Highest education level: Not on file  Occupational History  . Not on file  Social Needs  . Financial resource strain: Not on file  . Food insecurity:    Worry: Not on file    Inability: Not on file  . Transportation needs:    Medical: Not on file    Non-medical: Not on file  Tobacco Use  . Smoking status: Never Smoker  . Smokeless tobacco: Never Used  Substance and Sexual Activity  . Alcohol use: No  . Drug use: No  . Sexual activity: Not on file  Lifestyle  . Physical activity:    Days per week: Not on file    Minutes per session: Not on file  . Stress: Not on file  Relationships  . Social connections:    Talks on phone: Not on file    Gets together: Not on file    Attends religious service: Not on file    Active member of club or organization: Not on file    Attends meetings of clubs or organizations: Not on file    Relationship status: Not on file  . Intimate partner violence:    Fear of current or ex partner: Not on file    Emotionally abused: Not on file    Physically abused: Not on file    Forced sexual activity: Not on file   Other Topics Concern  . Not on file  Social History Narrative   Lives with wife   Caffeine use: Drinks 6 cups coffee per day   Right handed     Family History  Problem Relation Age of Onset  . Cancer Mother   . Heart attack Father     Anti-infectives: Anti-infectives (From admission, onward)   Start     Dose/Rate Route Frequency Ordered Stop   09/25/18 1200  piperacillin-tazobactam (ZOSYN) IVPB 3.375 g     3.375 g 12.5 mL/hr over 240 Minutes Intravenous Every 8 hours 09/25/18 0502     09/25/18 0515  piperacillin-tazobactam (ZOSYN) IVPB 3.375 g     3.375 g 100 mL/hr over 30 Minutes Intravenous  Once 09/25/18 0501 09/25/18 0844      Current Facility-Administered Medications  Medication Dose Route Frequency Provider Last Rate Last Dose  . 0.9 %  sodium chloride infusion   Intravenous Continuous Rise Patience, MD   Stopped at 09/25/18 1823  . acetaminophen (TYLENOL) tablet 650 mg  650 mg Oral  Q6H PRN Rise Patience, MD       Or  . acetaminophen (TYLENOL) suppository 650 mg  650 mg Rectal Q6H PRN Rise Patience, MD      . bisacodyl (DULCOLAX) suppository 10 mg  10 mg Rectal Once Vann, Jessica U, DO      . hydrALAZINE (APRESOLINE) injection 5 mg  5 mg Intravenous Q4H PRN Ronnette Juniper, MD      . HYDROmorphone (DILAUDID) injection 2 mg  2 mg Intravenous Q2H PRN Rise Patience, MD   2 mg at 09/25/18 1300  . ondansetron (ZOFRAN) tablet 4 mg  4 mg Oral Q6H PRN Rise Patience, MD       Or  . ondansetron [facility]) injection 4 mg  4 mg Intravenous Q6H PRN Rise Patience, MD   4 mg at 09/25/18 1300  . piperacillin-tazobactam (ZOSYN) IVPB 3.375 g  3.375 g Intravenous Q8H Rise Patience, MD   3.375 g at 09/25/18 1604     Objective: Vital signs in last 24 hours: Temp:  [98.1 F (36.7 C)-100 F (37.8 C)] 100 F (37.8 C) (02/14 1915) Pulse Rate:  [63-105] 104 (02/14 1854) Resp:  [12-25] 15 (02/14 1854) BP: (110-213)/(58-128) 175/104 (02/14  1854) SpO2:  [90 %-100 %] 94 % (02/14 1854) Weight:  [71.7 kg] 71.7 kg (02/14 0500)  Intake/Output from previous day: No intake/output data recorded. Intake/Output this shift: No intake/output data recorded.   Physical Exam Vitals signs reviewed.  Constitutional:      Comments: WD, WN Intubated  Abdominal:     Palpations: Abdomen is soft. There is no mass.  Genitourinary:    Comments: Uncircumcised.  Blood at the meatus.  Scrotum, testes and epididymis unremarkable.      Lab Results:  Recent Labs    09/24/18 2050 09/25/18 0555  WBC 9.0 11.5*  HGB 13.5 13.6  HCT 38.2* 38.6*  PLT 175 142*   BMET Recent Labs    09/24/18 2050 09/24/18 2102 09/25/18 0555  NA 138  --  141  K 4.0  --  3.4*  CL 104  --  109  CO2 21*  --  17*  GLUCOSE 165*  --  131*  BUN 16  --  18  CREATININE 0.80 0.60* 1.13  CALCIUM 9.4  --  9.6   PT/INR No results for input(s): LABPROT, INR in the last 72 hours. ABG No results for input(s): PHART, HCO3 in the last 72 hours.  Invalid input(s): PCO2, PO2  Studies/Results: Dg Chest 2 View  Result Date: 09/24/2018 CLINICAL DATA:  Chest pain.  Nausea, vomiting. EXAM: CHEST - 2 VIEW COMPARISON:  10/26/2017 FINDINGS: Large hiatal hernia. Heart is borderline in size. No confluent airspace opacity, effusion or edema. No acute bony abnormality. IMPRESSION: Large hiatal hernia.  Borderline heart size.  No active disease. Electronically Signed   By: Rolm Baptise M.D.   On: 09/24/2018 21:17   Ct Abdomen Pelvis W Contrast  Result Date: 09/24/2018 CLINICAL DATA:  Abdominal pain EXAM: CT ABDOMEN AND PELVIS WITH CONTRAST TECHNIQUE: Multidetector CT imaging of the abdomen and pelvis was performed using the standard protocol following bolus administration of intravenous contrast. CONTRAST:  182mL OMNIPAQUE 300 COMPARISON:  None. FINDINGS: Lower chest: Lung bases are free of acute infiltrate or sizable effusion. Coronary calcifications are identified. Moderate sized  hiatal hernia is noted. Hepatobiliary: Gallbladder is well distended. The liver is within normal limits. No definitive common bile duct stone is seen. Pancreas: The pancreas is  predominately atrophic. Spleen: Spleen is within normal limits. Adrenals/Urinary Tract: Adrenal glands are unremarkable. Kidneys demonstrate a normal enhancement pattern bilaterally. Normal excretion of contrast is seen. Bladder is well distended. Stomach/Bowel: Fecal material is noted throughout the colon consistent with mild constipation. The appendix is within normal limits. No small bowel inflammatory changes are seen. The stomach demonstrates a hiatal hernia. Vascular/Lymphatic: Aortic atherosclerosis. No enlarged abdominal or pelvic lymph nodes. Reproductive: Prostate is unremarkable. Other: No abdominal wall hernia or abnormality. No abdominopelvic ascites. Musculoskeletal: Degenerative changes of the lumbar spine are noted. No acute compression deformity is seen. Scoliosis concave to the left is noted in the mid lumbar spine. IMPRESSION: Changes of constipation.  No other focal abnormality is noted Electronically Signed   By: Inez Catalina M.D.   On: 09/24/2018 22:48   Mr Abdomen Mrcp Wo Contrast  Result Date: 09/25/2018 CLINICAL DATA:  Evaluate for distal common bile duct stone. EXAM: MRI ABDOMEN WITHOUT CONTRAST  (INCLUDING MRCP) TECHNIQUE: Multiplanar multisequence MR imaging of the abdomen was performed. Heavily T2-weighted images of the biliary and pancreatic ducts were obtained, and three-dimensional MRCP images were rendered by post processing. COMPARISON:  Chest CT 09/25/2018 and abdominal CT scan 09/24/2018. Ultrasound examination 09/25/2018 FINDINGS: Examination is quite limited due to respiratory motion. Lower chest: The lung bases are grossly clear. Moderate eventration of the right hemidiaphragm. Mild cardiac enlargement but no pericardial effusion. Hepatobiliary: No obvious focal hepatic lesions. There is mild  intrahepatic biliary dilatation and mild common bile duct dilatation, measuring up to 10 mm. The gallbladder is moderately distended with maximum transverse diameter of 6.1 cm. A few small layering gallstones are also noted. No gallbladder wall thickening or pericholecystic inflammatory changes or fluid to suggest acute cholecystitis. There is an 8 x 7 mm distal common bile duct stone. Pancreas:  No obvious mass, inflammation or ductal dilatation. Spleen:  Borderline splenomegaly.  No focal lesions. Adrenals/Urinary Tract: The adrenal glands and kidneys are grossly normal. Stomach/Bowel: Visualized portions within the abdomen are unremarkable. Vascular/Lymphatic: No pathologically enlarged lymph nodes identified. No abdominal aortic aneurysm demonstrated. Other:  No ascites or abdominal wall hernia. Musculoskeletal: No significant bony findings. Lower lumbar scoliosis noted. IMPRESSION: 1. 8 x 7 mm distal common bile duct stone. 2. Moderate gallbladder distension and mild common bile duct dilatation (10 mm). Small gallstones noted the gallbladder. 3. Limited examination but no gross abnormality involving the liver, pancreas or spleen. Mild splenomegaly. Electronically Signed   By: Marijo Sanes M.D.   On: 09/25/2018 13:17   Ct Angio Chest/abd/pel For Dissection W And/or W/wo  Result Date: 09/25/2018 CLINICAL DATA:  Chest pain with acute aortic syndrome suspected. Patient already received contrast tonight but has normal renal function, is not diabetic, and is to be hydrated. A reduced dose was used. EXAM: CT ANGIOGRAPHY CHEST, ABDOMEN AND PELVIS TECHNIQUE: Multidetector CT imaging through the chest, abdomen and pelvis was performed using the standard protocol during bolus administration of intravenous contrast. Multiplanar reconstructed images and MIPs were obtained and reviewed to evaluate the vascular anatomy. CONTRAST:  68mL OMNIPAQUE IOHEXOL 300 MG/ML  SOLN COMPARISON:  Abdomen and pelvis CT from earlier  today. FINDINGS: CTA CHEST FINDINGS Cardiovascular: Noncontrast phase shows no intramural hematoma of the aorta. No cardiomegaly or pericardial effusion. No aortic dissection inflammatory wall thickening on postcontrast imaging. Partial coverage of the right proximal subclavian shows likely high-grade stenosis. There is limited opacification of the pulmonary arteries with no central filling defect noted. Mediastinum/Nodes: Negative for adenopathy.  Large hiatal  hernia. Lungs/Pleura: Central airways are clear. There is no edema, consolidation, effusion, or pneumothorax. Musculoskeletal: No acute or aggressive finding. Spinal degeneration and dextroscoliosis. Review of the MIP images confirms the above findings. CTA ABDOMEN AND PELVIS FINDINGS VASCULAR Aorta: Atheromatous plaque.  No aneurysm or dissection. Celiac: Patent without evidence of aneurysm, dissection, vasculitis or significant stenosis. SMA: Minimal atheromatous changes. No branch occlusion, beading, or flow limiting stenosis. Renals: Mild calcified plaque at the renal artery ostia. No stenosis or aneurysm seen. IMA: Patent Inflow: Atherosclerosis Veins: Negative Review of the MIP images confirms the above findings. NON-VASCULAR Hepatobiliary: Mild altered perfusion about the gallbladder fossa better evaluated on prior.Full gallbladder which was evaluated by right upper quadrant ultrasound earlier. There is a 10 mm high-density at the distal common bile duct. The common bile duct measures 1 cm in diameter. Pancreas: Prominent fatty atrophy. Spleen: Prominent craniocaudal span but no thickening. Adrenals/Urinary Tract: Negative adrenals. No hydronephrosis or stone. Contrast in the bladder from prior study showing mild trabeculation. Stomach/Bowel: Formed stool throughout the tortuous colon. No obstruction or bowel inflammation is seen. Hiatal hernia as noted above. Lymphatic: No mass or adenopathy. Reproductive:Enlarged prostate Other: No ascites or  pneumoperitoneum. Musculoskeletal: Advanced spinal degeneration with dextroscoliosis. 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 Review of the MIP images confirms the above findings. IMPRESSION: 1. Probable 1 cm distal CBD stone 2. No findings of acute aortic syndrome. 3. 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 4. Generalized stool retention. Electronically Signed   By: Monte Fantasia M.D.   On: 09/25/2018 04:43   US Abdomen Limited Ruq  Result Date: 09/25/2018 CLINICAL DATA:  Initial evaluation for acute right upper quadrant abdominal pain. EXAM: ULTRASOUND ABDOMEN LIMITED RIGHT UPPER QUADRANT COMPARISON:  Prior CT from 09/24/2018 FINDINGS: Gallbladder: Internal sludge seen within the gallbladder without frank shadowing echogenic stones. Gallbladder wall mildly thickened up to 5 mm. No free pericholecystic fluid. No sonographic Murphy sign indicated by sonographer. Common bile duct: Diameter: 8 mm Liver: No focal lesion identified. Within normal limits in parenchymal echogenicity. Portal vein is patent on color Doppler imaging with normal direction of blood flow towards the liver. IMPRESSION: 1. Gallbladder sludge with mild gallbladder wall thickening. No cholelithiasis or additional sonographic features to suggest acute cholecystitis. 2. No biliary dilatation. Electronically Signed   By: Jeannine Boga M.D.   On: 09/25/2018 01:12   Hospital notes, labs and pertinent imaging reviewed.   No GU history noted.     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  I was able to place an 15fr councill catheter with cystoscopic guidance.   Will need foley for 1 week.     CC: Dr. Norva Pavlov.     Irine Seal 09/25/2018 [phone]

## 2018-09-25 NOTE — Progress Notes (Signed)
Received report from [place], patient being transported to Endo for ERCP first.

## 2018-09-25 NOTE — ED Notes (Signed)
Pt still has no change in restlessness since medication given. Pt laid down in the bed still for about 10 seconds but then stood back up. Pt currently grunting and making straining noises. Tech at bedside. MD aware

## 2018-09-25 NOTE — Anesthesia Preprocedure Evaluation (Signed)
Anesthesia Evaluation  Patient identified by MRN, date of birth, ID band Patient awake    Reviewed: Allergy & Precautions, NPO status , Patient's Chart, lab work & pertinent test results  Airway Mallampati: II  TM Distance: >3 FB Neck ROM: Full    Dental no notable dental hx. (+) Teeth Intact   Pulmonary neg pulmonary ROS,    Pulmonary exam normal breath sounds clear to auscultation       Cardiovascular + CAD   Rhythm:Regular  Thoracic aortic aneurysm   Neuro/Psych negative neurological ROS  negative psych ROS   GI/Hepatic Neg liver ROS, Cholelithiasis with Choledocholithiasis   Endo/Other  negative endocrine ROS  Renal/GU negative Renal ROS  negative genitourinary   Musculoskeletal Chronic back pain   Abdominal   Peds  Hematology negative hematology ROS (+)   Anesthesia Other Findings   Reproductive/Obstetrics                             Anesthesia Physical Anesthesia Plan  ASA: III  Anesthesia Plan: General   Post-op Pain Management:    Induction: Intravenous  PONV Risk Score and Plan: 3 and Ondansetron, Treatment may vary due to age or medical condition and Dexamethasone  Airway Management Planned: Oral ETT  Additional Equipment:   Intra-op Plan:   Post-operative Plan: Extubation in OR  Informed Consent: I have reviewed the patients History and Physical, chart, labs and discussed the procedure including the risks, benefits and alternatives for the proposed anesthesia with the patient or authorized representative who has indicated his/her understanding and acceptance.     Dental advisory given  Plan Discussed with: CRNA and Surgeon  Anesthesia Plan Comments:         Anesthesia Quick Evaluation

## 2018-09-25 NOTE — ED Notes (Signed)
Pt back from MRI after having a limited study due to pain. Pt in extreme pain again in abdomen. This RN to give pt next dose of PRN dilaudid.

## 2018-09-25 NOTE — Progress Notes (Signed)
Pharmacy Antibiotic Note  Riley Eaton is a 78 y.o. male admitted on 09/24/2018 with intra-abdominal infxn.  Pharmacy has been consulted for zosyn dosing. Pt is afebrile and WBC is WNL. Scr is WNL.   Plan: Zosyn 3.375gm IV Q8H (4 hr inf) F/u renal fxn, C&S, clinical status and LOT  *Pharmacy will sign off as no dose adjustments are anticipated. Thank you for the consult!  Height: 5\' 7"  (170.2 cm) Weight: 158 lb (71.7 kg) IBW/kg (Calculated) : 66.1  Temp (24hrs), Avg:98.2 F (36.8 C), Min:98.2 F (36.8 C), Max:98.2 F (36.8 C)  Recent Labs  Lab 09/24/18 2050 09/24/18 2102 09/25/18 0010  WBC 9.0  --   --   CREATININE 0.80 0.60*  --   LATICACIDVEN  --   --  1.4    Estimated Creatinine Clearance: 70 mL/min (A) (by C-G formula based on SCr of 0.6 mg/dL (L)).    No Known Allergies  Antimicrobials this admission: Zosyn 2/14>>  Dose adjustments this admission: N/A  Microbiology results: Pending  Thank you for allowing pharmacy to be a part of this patient's care.  Sherl Yzaguirre, Rande Lawman 09/25/2018 5:04 AM

## 2018-09-25 NOTE — ED Notes (Signed)
Md at bedside

## 2018-09-25 NOTE — H&P (Signed)
History and Physical    CEM KOSMAN ZOX:[id number] DOB: 03/18/1941 DOA: 09/24/2018  PCP: Aletha Halim., PA-C  Patient coming from: Home.  Chief Complaint: Chest pain.  HPI: Riley Eaton is a 78 y.o. male with history of chronic low back pain, BPH was brought to the ER after patient started complaining of chest pain.  Patient's chest pain started last evening around 7 PM.  Prior to which patient had nausea vomiting.  Denies any fever chills or difficulty breathing.  ED Course: In the ER patient was found to have a tender abdomen.  Chest x-ray EKG was unremarkable troponin was negative.  LFTs show mildly elevated total bilirubin.  CT scan of the abdomen was done which shows constipation sonogram shows possibility of cholecystitis.  On-call general surgeon Dr. Charlott Rakes also was consulted and plan was to have cholecystectomy in the morning.  Following admission patient started having severe abdominal pain for which a repeat CAT scan was done of the chest abdomen pelvis to rule out dissection.  Shows CBD stone.  Because patient was restless at least 2 doses of 2 mg IV Haldol was given twice.  In addition patient also was given Dilaudid.  Review of Systems: As per HPI, rest all negative.   Past Medical History:  Diagnosis Date  . Chronic back pain   . 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 Coronary artery calcification seen on CT scan   . Nerve pain   . Thoracic aortic aneurysm [facility])     Past Surgical History:  Procedure Laterality Date  . BACK SURGERY     09-2016  . back surgey       reports that he has never smoked. He has never used smokeless tobacco. He reports that he does not drink alcohol or use drugs.  No Known Allergies  Family History  Problem Relation Age of Onset  . Cancer Mother   . Heart attack Father     Prior to Admission medications   Medication Sig Start Date End Date Taking? Authorizing Provider  allopurinol (ZYLOPRIM) 100 MG tablet TAKE 1 TABLET BY  MOUTH EVERY DAY Patient taking differently: Take 100 mg by mouth daily.  09/15/18  Yes Kathrynn Ducking, MD  bisacodyl (DULCOLAX) 10 MG suppository Place 1 suppository (10 mg total) rectally as needed for moderate constipation. 10/26/17  Yes Varney Biles, MD  calcium-vitamin D (OSCAL WITH D) 500-200 MG-UNIT tablet Take 1 tablet by mouth daily with breakfast.   Yes [provider]  Diphenhydramine-APAP, sleep, (TYLENOL PM EXTRA STRENGTH PO) Take 1 tablet by mouth at bedtime.    Yes [provider]  DULoxetine (CYMBALTA) 60 MG capsule TAKE 1 CAPSULE BY MOUTH TWICE A DAY Patient taking differently: Take 60 mg by mouth 2 (two) times daily.  05/05/18  Yes Kathrynn Ducking, MD  HYDROcodone-acetaminophen (NORCO/VICODIN) 5-325 MG tablet Take 1 tablet by mouth every 6 (six) hours as needed for moderate pain. Must last 28 days. 09/21/18  Yes Kathrynn Ducking, MD  oxyCODONE (OXYCONTIN) 80 mg 12 hr tablet Take 1 tablet (80 mg total) by mouth every 12 (twelve) hours. 09/21/18  Yes Kathrynn Ducking, MD  pregabalin (LYRICA) 200 MG capsule TAKE 1 CAPSULE BY MOUTH THREE TIMES A DAY Patient taking differently: Take 200 mg by mouth 3 (three) times daily.  09/23/18  Yes Kathrynn Ducking, MD  promethazine (PHENERGAN) 25 MG tablet Take 1 tablet by mouth every 4 (four) hours as needed for nausea or  vomiting.  08/02/15  Yes [provider]  tamsulosin (FLOMAX) 0.4 MG CAPS capsule Take 0.4 mg by mouth daily.  08/29/15  Yes [provider]  traZODone (DESYREL) 50 MG tablet Take 25 mg by mouth at bedtime.    Yes [provider]  triamcinolone ointment (KENALOG) 0.1 % Apply 1 application topically 2 (two) times daily as needed for rash. 10/15/17  Yes [provider]  doxycycline (VIBRAMYCIN) 100 MG capsule Take 1 capsule (100 mg total) by mouth 2 (two) times daily. Patient not taking: Reported on 09/24/2018 10/26/17   Varney Biles, MD    Physical Exam: Vitals:   09/24/18  2200 09/24/18 2300 09/25/18 0331 09/25/18 0500  BP: (!) 165/88 (!) 169/95    Pulse: 67 73 (!) 105   Resp: 15 16 20    Temp:      TempSrc:      SpO2: 95% 97% 100%   Weight:    71.7 kg  Height:    5\' 7"  (1.702 m)      Constitutional: Moderately built and nourished. Vitals:   09/24/18 2200 09/24/18 2300 09/25/18 0331 09/25/18 0500  BP: (!) 165/88 (!) 169/95    Pulse: 67 73 (!) 105   Resp: 15 16 20    Temp:      TempSrc:      SpO2: 95% 97% 100%   Weight:    71.7 kg  Height:    5\' 7"  (1.702 m)   Eyes: Anicteric no pallor. ENMT: No discharge from the ears eyes nose or mouth. Neck: No mass felt.  No neck rigidity. Respiratory: No rhonchi or crepitations. Cardiovascular: S1-S2 heard. Abdomen: Tenderness in the epigastric area no guarding or rigidity. Musculoskeletal: No edema. Skin: No rash. Neurologic: Alert awake oriented to time place and person.  Moves all extremities. Psychiatric: Appears normal per normal affect.   Labs on Admission: I have personally reviewed following labs and imaging studies  CBC: Recent Labs  Lab 09/24/18 2050  WBC 9.0  HGB 13.5  HCT 38.2*  MCV 89.5  PLT 509   Basic Metabolic Panel: Recent Labs  Lab 09/24/18 2050 09/24/18 2102  NA 138  --   K 4.0  --   CL 104  --   CO2 21*  --   GLUCOSE 165*  --   BUN 16  --   CREATININE 0.80 0.60*  CALCIUM 9.4  --    GFR: Estimated Creatinine Clearance: 70 mL/min (A) (by C-G formula based on SCr of 0.6 mg/dL (L)). Liver Function Tests: Recent Labs  Lab 09/24/18 2058  AST 32  ALT 17  ALKPHOS 96  BILITOT 1.4*  PROT 6.7  ALBUMIN 4.0   Recent Labs  Lab 09/24/18 2058  LIPASE 23   No results for input(s): AMMONIA in the last 168 hours. Coagulation Profile: No results for input(s): INR, PROTIME in the last 168 hours. Cardiac Enzymes: No results for input(s): CKTOTAL, CKMB, CKMBINDEX, TROPONINI in the last 168 hours. BNP (last 3 results) No results for input(s): PROBNP in the last 8760  hours. HbA1C: No results for input(s): HGBA1C in the last 72 hours. CBG: No results for input(s): GLUCAP in the last 168 hours. Lipid Profile: No results for input(s): CHOL, HDL, LDLCALC, TRIG, CHOLHDL, LDLDIRECT in the last 72 hours. Thyroid Function Tests: No results for input(s): TSH, T4TOTAL, FREET4, T3FREE, THYROIDAB in the last 72 hours. Anemia Panel: No results for input(s): VITAMINB12, FOLATE, FERRITIN, TIBC, IRON, RETICCTPCT in the last 72 hours. Urine analysis:  Component Value Date/Time   COLORURINE YELLOW 10/26/2017 0928   APPEARANCEUR CLEAR 10/26/2017 0928   LABSPEC 1.011 10/26/2017 0928   PHURINE 7.0 10/26/2017 0928   GLUCOSEU NEGATIVE 10/26/2017 0928   HGBUR NEGATIVE 10/26/2017 0928   BILIRUBINUR NEGATIVE 10/26/2017 0928   KETONESUR 5 (A) 10/26/2017 0928   PROTEINUR NEGATIVE 10/26/2017 0928   NITRITE NEGATIVE 10/26/2017 0928   LEUKOCYTESUR NEGATIVE 10/26/2017 0928   Sepsis Labs: @LABRCNTIP (procalcitonin:4,lacticidven:4) )No results found for this or any previous visit (from the past 240 hour(s)).   Radiological Exams on Admission: Dg Chest 2 View  Result Date: 09/24/2018 CLINICAL DATA:  Chest pain.  Nausea, vomiting. EXAM: CHEST - 2 VIEW COMPARISON:  10/26/2017 FINDINGS: Large hiatal hernia. Heart is borderline in size. No confluent airspace opacity, effusion or edema. No acute bony abnormality. IMPRESSION: Large hiatal hernia.  Borderline heart size.  No active disease. Electronically Signed   By: Rolm Baptise M.D.   On: 09/24/2018 21:17   Ct Abdomen Pelvis W Contrast  Result Date: 09/24/2018 CLINICAL DATA:  Abdominal pain EXAM: CT ABDOMEN AND PELVIS WITH CONTRAST TECHNIQUE: Multidetector CT imaging of the abdomen and pelvis was performed using the standard protocol following bolus administration of intravenous contrast. CONTRAST:  139mL OMNIPAQUE 300 COMPARISON:  None. FINDINGS: Lower chest: Lung bases are free of acute infiltrate or sizable effusion. Coronary  calcifications are identified. Moderate sized hiatal hernia is noted. Hepatobiliary: Gallbladder is well distended. The liver is within normal limits. No definitive common bile duct stone is seen. Pancreas: The pancreas is predominately atrophic. Spleen: Spleen is within normal limits. Adrenals/Urinary Tract: Adrenal glands are unremarkable. Kidneys demonstrate a normal enhancement pattern bilaterally. Normal excretion of contrast is seen. Bladder is well distended. Stomach/Bowel: Fecal material is noted throughout the colon consistent with mild constipation. The appendix is within normal limits. No small bowel inflammatory changes are seen. The stomach demonstrates a hiatal hernia. Vascular/Lymphatic: Aortic atherosclerosis. No enlarged abdominal or pelvic lymph nodes. Reproductive: Prostate is unremarkable. Other: No abdominal wall hernia or abnormality. No abdominopelvic ascites. Musculoskeletal: Degenerative changes of the lumbar spine are noted. No acute compression deformity is seen. Scoliosis concave to the left is noted in the mid lumbar spine. IMPRESSION: Changes of constipation.  No other focal abnormality is noted Electronically Signed   By: Inez Catalina M.D.   On: 09/24/2018 22:48   Ct Angio Chest/abd/pel For Dissection W And/or W/wo  Result Date: 09/25/2018 CLINICAL DATA:  Chest pain with acute aortic syndrome suspected. Patient already received contrast tonight but has normal renal function, is not diabetic, and is to be hydrated. A reduced dose was used. EXAM: CT ANGIOGRAPHY CHEST, ABDOMEN AND PELVIS TECHNIQUE: Multidetector CT imaging through the chest, abdomen and pelvis was performed using the standard protocol during bolus administration of intravenous contrast. Multiplanar reconstructed images and MIPs were obtained and reviewed to evaluate the vascular anatomy. CONTRAST:  71mL OMNIPAQUE IOHEXOL 300 MG/ML  SOLN COMPARISON:  Abdomen and pelvis CT from earlier today. FINDINGS: CTA CHEST  FINDINGS Cardiovascular: Noncontrast phase shows no intramural hematoma of the aorta. No cardiomegaly or pericardial effusion. No aortic dissection inflammatory wall thickening on postcontrast imaging. Partial coverage of the right proximal subclavian shows likely high-grade stenosis. There is limited opacification of the pulmonary arteries with no central filling defect noted. Mediastinum/Nodes: Negative for adenopathy.  Large hiatal hernia. Lungs/Pleura: Central airways are clear. There is no edema, consolidation, effusion, or pneumothorax. Musculoskeletal: No acute or aggressive finding. Spinal degeneration and dextroscoliosis. Review of the MIP images  confirms the above findings. CTA ABDOMEN AND PELVIS FINDINGS VASCULAR Aorta: Atheromatous plaque.  No aneurysm or dissection. Celiac: Patent without evidence of aneurysm, dissection, vasculitis or significant stenosis. SMA: Minimal atheromatous changes. No branch occlusion, beading, or flow limiting stenosis. Renals: Mild calcified plaque at the renal artery ostia. No stenosis or aneurysm seen. IMA: Patent Inflow: Atherosclerosis Veins: Negative Review of the MIP images confirms the above findings. NON-VASCULAR Hepatobiliary: Mild altered perfusion about the gallbladder fossa better evaluated on prior.Full gallbladder which was evaluated by right upper quadrant ultrasound earlier. There is a 10 mm high-density at the distal common bile duct. The common bile duct measures 1 cm in diameter. Pancreas: Prominent fatty atrophy. Spleen: Prominent craniocaudal span but no thickening. Adrenals/Urinary Tract: Negative adrenals. No hydronephrosis or stone. Contrast in the bladder from prior study showing mild trabeculation. Stomach/Bowel: Formed stool throughout the tortuous colon. No obstruction or bowel inflammation is seen. Hiatal hernia as noted above. Lymphatic: No mass or adenopathy. Reproductive:Enlarged prostate Other: No ascites or pneumoperitoneum. Musculoskeletal:  Advanced spinal degeneration with dextroscoliosis. 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 Review of the MIP images confirms the above findings. IMPRESSION: 1. Probable 1 cm distal CBD stone 2. No findings of acute aortic syndrome. 3. 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 4. Generalized stool retention. Electronically Signed   By: Monte Fantasia M.D.   On: 09/25/2018 04:43   US Abdomen Limited Ruq  Result Date: 09/25/2018 CLINICAL DATA:  Initial evaluation for acute right upper quadrant abdominal pain. EXAM: ULTRASOUND ABDOMEN LIMITED RIGHT UPPER QUADRANT COMPARISON:  Prior CT from 09/24/2018 FINDINGS: Gallbladder: Internal sludge seen within the gallbladder without frank shadowing echogenic stones. Gallbladder wall mildly thickened up to 5 mm. No free pericholecystic fluid. No sonographic Murphy sign indicated by sonographer. Common bile duct: Diameter: 8 mm Liver: No focal lesion identified. Within normal limits in parenchymal echogenicity. Portal vein is patent on color Doppler imaging with normal direction of blood flow towards the liver. IMPRESSION: 1. Gallbladder sludge with mild gallbladder wall thickening. No cholelithiasis or additional sonographic features to suggest acute cholecystitis. 2. No biliary dilatation. Electronically Signed   By: Jeannine Boga M.D.   On: 09/25/2018 01:12    EKG: Independently reviewed.  Normal sinus rhythm.  Assessment/Plan Principal Problem:   Choledocholithiasis Active Problems:   Nausea & vomiting   Chronic pain    1. CBD stone with possible cholecystitis -discussed with Dr. Ramond Marrow general surgeon.  Plan is to have surgery this morning.  On empiric antibiotics.  IV pain medication and n.p.o. 2. Chest pain likely from abdominal pain.  Will saline: EKG was unremarkable. 3. Patient is restless likely from pain for which I have ordered Dilaudid. 4. History of BPH will need to  continue home medication once patient can take orally. 5. Elevated blood pressure -closely follow blood pressure trends. 6. Chronic pain is presently n.p.o.  On IV pain medication as needed.   DVT prophylaxis: SCDs. Code Status: Full code. Family Communication: No family at the bedside. Disposition Plan: Home. Consults called: General surgery. Admission status: Inpatient.   Rise Patience MD [facility] Pager [phone].  If 7PM-7AM, please contact night-coverage [email or web address] Password [email or web address]  09/25/2018, 5:20 AM

## 2018-09-25 NOTE — ED Notes (Signed)
Pt assisted to stand and use urinal. Pt still appears to be in extreme pain.

## 2018-09-25 NOTE — H&P (View-Only) (Signed)
[facility] Consult  Referring Provider: Dr.Jessica Vann/[facility] Primary Care Physician:  Aletha Halim., PA-C Primary Gastroenterologist: [facility]  Reason for Consultation:  Possible CBD stone  HPI: Riley Eaton is a 78 y.o. male with complains of nausea and vomiting.  Patient has received several doses of Haldol and Dilaudid, appeared somnolent and restless and most of the history is obtained from his wife present at bedside in ER.  As per her, he developed generalized abdominal pain starting at 11 AM yesterday associated with several episodes of nausea and bilious vomiting which prompted them to proceed to the ER around 8 PM yesterday evening.  No fever reported at home but patient was complaining of chills.  Patient is on narcotics for back pain, hydrocodone/acetaminophen 5/325 1 tablet every 6 hours as needed, oxycodone 80 mg every 12 hours and reports severe constipation. Last bowel movement was 2-3 weeks ago.  Occasionally he takes a laxative as needed.  There is no history of change in appetite prior to this episode, no history of unintentional weight loss, difficulty swallowing or pain on swallowing. Last colonoscopy was performed in 2013, poor prep was noted, subsequent barium enema was unremarkable.   Past Medical History:  Diagnosis Date  . Chronic back pain   . 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 Coronary artery calcification seen on CT scan   . Nerve pain   . Thoracic aortic aneurysm [facility])     Past Surgical History:  Procedure Laterality Date  . BACK SURGERY     09-2016  . back surgey      Prior to Admission medications   Medication Sig Start Date End Date Taking? Authorizing Provider  allopurinol (ZYLOPRIM) 100 MG tablet TAKE 1 TABLET BY MOUTH EVERY DAY Patient taking differently: Take 100 mg by mouth daily.  09/15/18  Yes Kathrynn Ducking, MD  bisacodyl (DULCOLAX) 10 MG suppository Place 1 suppository (10 mg total) rectally as  needed for moderate constipation. 10/26/17  Yes Varney Biles, MD  calcium-vitamin D (OSCAL WITH D) 500-200 MG-UNIT tablet Take 1 tablet by mouth daily with breakfast.   Yes [provider]  Diphenhydramine-APAP, sleep, (TYLENOL PM EXTRA STRENGTH PO) Take 1 tablet by mouth at bedtime.    Yes [provider]  DULoxetine (CYMBALTA) 60 MG capsule TAKE 1 CAPSULE BY MOUTH TWICE A DAY Patient taking differently: Take 60 mg by mouth 2 (two) times daily.  05/05/18  Yes Kathrynn Ducking, MD  HYDROcodone-acetaminophen (NORCO/VICODIN) 5-325 MG tablet Take 1 tablet by mouth every 6 (six) hours as needed for moderate pain. Must last 28 days. 09/21/18  Yes Kathrynn Ducking, MD  oxyCODONE (OXYCONTIN) 80 mg 12 hr tablet Take 1 tablet (80 mg total) by mouth every 12 (twelve) hours. 09/21/18  Yes Kathrynn Ducking, MD  pregabalin (LYRICA) 200 MG capsule TAKE 1 CAPSULE BY MOUTH THREE TIMES A DAY Patient taking differently: Take 200 mg by mouth 3 (three) times daily.  09/23/18  Yes Kathrynn Ducking, MD  promethazine (PHENERGAN) 25 MG tablet Take 1 tablet by mouth every 4 (four) hours as needed for nausea or vomiting.  08/02/15  Yes [provider]  tamsulosin (FLOMAX) 0.4 MG CAPS capsule Take 0.4 mg by mouth daily.  08/29/15  Yes [provider]  traZODone (DESYREL) 50 MG tablet Take 25 mg by mouth at bedtime.    Yes [provider]  triamcinolone ointment (KENALOG) 0.1 % Apply 1 application topically 2 (two) times daily as  needed for rash. 10/15/17  Yes [provider]  doxycycline (VIBRAMYCIN) 100 MG capsule Take 1 capsule (100 mg total) by mouth 2 (two) times daily. Patient not taking: Reported on 09/24/2018 10/26/17   Varney Biles, MD    Current Facility-Administered Medications  Medication Dose Route Frequency Provider Last Rate Last Dose  . 0.9 %  sodium chloride infusion   Intravenous Continuous Rise Patience, MD 100 mL/hr at 09/25/18 3235    .  acetaminophen (TYLENOL) tablet 650 mg  650 mg Oral Q6H PRN Rise Patience, MD       Or  . acetaminophen (TYLENOL) suppository 650 mg  650 mg Rectal Q6H PRN Rise Patience, MD      . HYDROmorphone (DILAUDID) injection 2 mg  2 mg Intravenous Q2H PRN Rise Patience, MD   2 mg at 09/25/18 1031  . ondansetron (ZOFRAN) tablet 4 mg  4 mg Oral Q6H PRN Rise Patience, MD       Or  . ondansetron [facility]) injection 4 mg  4 mg Intravenous Q6H PRN Rise Patience, MD      . piperacillin-tazobactam (ZOSYN) IVPB 3.375 g  3.375 g Intravenous Q8H Rise Patience, MD       Current Outpatient Medications  Medication Sig Dispense Refill  . allopurinol (ZYLOPRIM) 100 MG tablet TAKE 1 TABLET BY MOUTH EVERY DAY (Patient taking differently: Take 100 mg by mouth daily. ) 90 tablet 0  . bisacodyl (DULCOLAX) 10 MG suppository Place 1 suppository (10 mg total) rectally as needed for moderate constipation. 12 suppository 0  . calcium-vitamin D (OSCAL WITH D) 500-200 MG-UNIT tablet Take 1 tablet by mouth daily with breakfast.    . Diphenhydramine-APAP, sleep, (TYLENOL PM EXTRA STRENGTH PO) Take 1 tablet by mouth at bedtime.     . DULoxetine (CYMBALTA) 60 MG capsule TAKE 1 CAPSULE BY MOUTH TWICE A DAY (Patient taking differently: Take 60 mg by mouth 2 (two) times daily. ) 180 capsule 1  . HYDROcodone-acetaminophen (NORCO/VICODIN) 5-325 MG tablet Take 1 tablet by mouth every 6 (six) hours as needed for moderate pain. Must last 28 days. 60 tablet 0  . oxyCODONE (OXYCONTIN) 80 mg 12 hr tablet Take 1 tablet (80 mg total) by mouth every 12 (twelve) hours. 60 tablet 0  . pregabalin (LYRICA) 200 MG capsule TAKE 1 CAPSULE BY MOUTH THREE TIMES A DAY (Patient taking differently: Take 200 mg by mouth 3 (three) times daily. ) 270 capsule 1  . promethazine (PHENERGAN) 25 MG tablet Take 1 tablet by mouth every 4 (four) hours as needed for nausea or vomiting.   6  . tamsulosin (FLOMAX) 0.4 MG CAPS capsule  Take 0.4 mg by mouth daily.     . traZODone (DESYREL) 50 MG tablet Take 25 mg by mouth at bedtime.     . triamcinolone ointment (KENALOG) 0.1 % Apply 1 application topically 2 (two) times daily as needed for rash.  2  . doxycycline (VIBRAMYCIN) 100 MG capsule Take 1 capsule (100 mg total) by mouth 2 (two) times daily. (Patient not taking: Reported on 09/24/2018) 14 capsule 0    Allergies as of 09/24/2018  . (No Known Allergies)    Family History  Problem Relation Age of Onset  . Cancer Mother   . Heart attack Father     Social History   Socioeconomic History  . Marital status: Married    Spouse name: Not on file  . Number of children: 3  . Years  of education: 56  . Highest education level: Not on file  Occupational History  . Not on file  Social Needs  . Financial resource strain: Not on file  . Food insecurity:    Worry: Not on file    Inability: Not on file  . Transportation needs:    Medical: Not on file    Non-medical: Not on file  Tobacco Use  . Smoking status: Never Smoker  . Smokeless tobacco: Never Used  Substance and Sexual Activity  . Alcohol use: No  . Drug use: No  . Sexual activity: Not on file  Lifestyle  . Physical activity:    Days per week: Not on file    Minutes per session: Not on file  . Stress: Not on file  Relationships  . Social connections:    Talks on phone: Not on file    Gets together: Not on file    Attends religious service: Not on file    Active member of club or organization: Not on file    Attends meetings of clubs or organizations: Not on file    Relationship status: Not on file  . Intimate partner violence:    Fear of current or ex partner: Not on file    Emotionally abused: Not on file    Physically abused: Not on file    Forced sexual activity: Not on file  Other Topics Concern  . Not on file  Social History Narrative   Lives with wife   Caffeine use: Drinks 6 cups coffee per day   Right handed     Review of  Systems: Positive for: GI: Described in detail in HPI.    Gen: chills, Denies any fever, rigors, night sweats, anorexia, fatigue, weakness, malaise, involuntary weight loss, and sleep disorder CV: Denies chest pain, angina, palpitations, syncope, orthopnea, PND, peripheral edema, and claudication. Resp: Denies dyspnea, cough, sputum, wheezing, coughing up blood. GU : Denies urinary burning, blood in urine, urinary frequency, urinary hesitancy, nocturnal urination, and urinary incontinence. MS: back pain, Denies joint pain or swelling.  Denies muscle weakness, cramps, atrophy.  Derm: Denies rash, itching, oral ulcerations, hives, unhealing ulcers.  Psych: Denies depression, anxiety, memory loss, suicidal ideation, hallucinations,  and confusion. Heme: Denies bruising, bleeding, and enlarged lymph nodes. Neuro:  Denies any headaches, dizziness, paresthesias. Endo:  Denies any problems with DM, thyroid, adrenal function.  Physical Exam: Vital signs in last 24 hours: Temp:  [98.2 F (36.8 C)] 98.2 F (36.8 C) (02/13 2036) Pulse Rate:  [63-105] 76 (02/14 0915) Resp:  [13-25] 17 (02/14 1015) BP: (110-169)/(58-96) 155/96 (02/14 1015) SpO2:  [90 %-100 %] 93 % (02/14 0915) Weight:  [71.7 kg] 71.7 kg (02/14 0500)    General:   Restless, appears to be in pain Head:  Normocephalic and atraumatic. Eyes:  Sclera clear, no icterus.   Conjunctiva pink. Ears:  Normal auditory acuity. Nose:  No deformity, discharge,  or lesions. Mouth:  No deformity or lesions.  Oropharynx pink & moist. Neck:  Supple; no masses or thyromegaly. Lungs:  Clear throughout to auscultation.   No wheezes, crackles, or rhonchi. No acute distress. Heart:  Regular rate and rhythm; no murmurs, clicks, rubs,  or gallops. Extremities:  Without clubbing or edema. Neurologic: awake, follows commands, orientation could not be assessed as patient had recently received Haldol and Dilaudid, grossly normal neurologically. Skin:   Intact without significant lesions or rashes. Psych:  Alert and cooperative. Normal mood and affect. Abdomen:  Soft, generalized  abdominal tenderness and nondistended. No masses, hepatosplenomegaly or hernias noted. Normal bowel sounds, without guarding, and without rebound.         Lab Results: Recent Labs    09/24/18 2050 09/25/18 0555  WBC 9.0 11.5*  HGB 13.5 13.6  HCT 38.2* 38.6*  PLT 175 142*   BMET Recent Labs    09/24/18 2050 09/24/18 2102 09/25/18 0555  NA 138  --  141  K 4.0  --  3.4*  CL 104  --  109  CO2 21*  --  17*  GLUCOSE 165*  --  131*  BUN 16  --  18  CREATININE 0.80 0.60* 1.13  CALCIUM 9.4  --  9.6   LFT Recent Labs    09/25/18 0555  PROT 7.0  ALBUMIN 4.2  AST 34  ALT 17  ALKPHOS 94  BILITOT 1.8*  BILIDIR 0.4*  IBILI 1.4*   PT/INR No results for input(s): LABPROT, INR in the last 72 hours.  Studies/Results: Dg Chest 2 View  Result Date: 09/24/2018 CLINICAL DATA:  Chest pain.  Nausea, vomiting. EXAM: CHEST - 2 VIEW COMPARISON:  10/26/2017 FINDINGS: Large hiatal hernia. Heart is borderline in size. No confluent airspace opacity, effusion or edema. No acute bony abnormality. IMPRESSION: Large hiatal hernia.  Borderline heart size.  No active disease. Electronically Signed   By: Rolm Baptise M.D.   On: 09/24/2018 21:17   Ct Abdomen Pelvis W Contrast  Result Date: 09/24/2018 CLINICAL DATA:  Abdominal pain EXAM: CT ABDOMEN AND PELVIS WITH CONTRAST TECHNIQUE: Multidetector CT imaging of the abdomen and pelvis was performed using the standard protocol following bolus administration of intravenous contrast. CONTRAST:  166mL OMNIPAQUE 300 COMPARISON:  None. FINDINGS: Lower chest: Lung bases are free of acute infiltrate or sizable effusion. Coronary calcifications are identified. Moderate sized hiatal hernia is noted. Hepatobiliary: Gallbladder is well distended. The liver is within normal limits. No definitive common bile duct stone is seen. Pancreas:  The pancreas is predominately atrophic. Spleen: Spleen is within normal limits. Adrenals/Urinary Tract: Adrenal glands are unremarkable. Kidneys demonstrate a normal enhancement pattern bilaterally. Normal excretion of contrast is seen. Bladder is well distended. Stomach/Bowel: Fecal material is noted throughout the colon consistent with mild constipation. The appendix is within normal limits. No small bowel inflammatory changes are seen. The stomach demonstrates a hiatal hernia. Vascular/Lymphatic: Aortic atherosclerosis. No enlarged abdominal or pelvic lymph nodes. Reproductive: Prostate is unremarkable. Other: No abdominal wall hernia or abnormality. No abdominopelvic ascites. Musculoskeletal: Degenerative changes of the lumbar spine are noted. No acute compression deformity is seen. Scoliosis concave to the left is noted in the mid lumbar spine. IMPRESSION: Changes of constipation.  No other focal abnormality is noted Electronically Signed   By: Inez Catalina M.D.   On: 09/24/2018 22:48   Ct Angio Chest/abd/pel For Dissection W And/or W/wo  Result Date: 09/25/2018 CLINICAL DATA:  Chest pain with acute aortic syndrome suspected. Patient already received contrast tonight but has normal renal function, is not diabetic, and is to be hydrated. A reduced dose was used. EXAM: CT ANGIOGRAPHY CHEST, ABDOMEN AND PELVIS TECHNIQUE: Multidetector CT imaging through the chest, abdomen and pelvis was performed using the standard protocol during bolus administration of intravenous contrast. Multiplanar reconstructed images and MIPs were obtained and reviewed to evaluate the vascular anatomy. CONTRAST:  23mL OMNIPAQUE IOHEXOL 300 MG/ML  SOLN COMPARISON:  Abdomen and pelvis CT from earlier today. FINDINGS: CTA CHEST FINDINGS Cardiovascular: Noncontrast phase shows no intramural hematoma of the aorta.  No cardiomegaly or pericardial effusion. No aortic dissection inflammatory wall thickening on postcontrast imaging. Partial  coverage of the right proximal subclavian shows likely high-grade stenosis. There is limited opacification of the pulmonary arteries with no central filling defect noted. Mediastinum/Nodes: Negative for adenopathy.  Large hiatal hernia. Lungs/Pleura: Central airways are clear. There is no edema, consolidation, effusion, or pneumothorax. Musculoskeletal: No acute or aggressive finding. Spinal degeneration and dextroscoliosis. Review of the MIP images confirms the above findings. CTA ABDOMEN AND PELVIS FINDINGS VASCULAR Aorta: Atheromatous plaque.  No aneurysm or dissection. Celiac: Patent without evidence of aneurysm, dissection, vasculitis or significant stenosis. SMA: Minimal atheromatous changes. No branch occlusion, beading, or flow limiting stenosis. Renals: Mild calcified plaque at the renal artery ostia. No stenosis or aneurysm seen. IMA: Patent Inflow: Atherosclerosis Veins: Negative Review of the MIP images confirms the above findings. NON-VASCULAR Hepatobiliary: Mild altered perfusion about the gallbladder fossa better evaluated on prior.Full gallbladder which was evaluated by right upper quadrant ultrasound earlier. There is a 10 mm high-density at the distal common bile duct. The common bile duct measures 1 cm in diameter. Pancreas: Prominent fatty atrophy. Spleen: Prominent craniocaudal span but no thickening. Adrenals/Urinary Tract: Negative adrenals. No hydronephrosis or stone. Contrast in the bladder from prior study showing mild trabeculation. Stomach/Bowel: Formed stool throughout the tortuous colon. No obstruction or bowel inflammation is seen. Hiatal hernia as noted above. Lymphatic: No mass or adenopathy. Reproductive:Enlarged prostate Other: No ascites or pneumoperitoneum. Musculoskeletal: Advanced spinal degeneration with dextroscoliosis. 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 Review of the MIP images confirms the above findings. IMPRESSION: 1. Probable 1 cm distal CBD stone 2. No  findings of acute aortic syndrome. 3. 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 4. Generalized stool retention. Electronically Signed   By: Monte Fantasia M.D.   On: 09/25/2018 04:43   US Abdomen Limited Ruq  Result Date: 09/25/2018 CLINICAL DATA:  Initial evaluation for acute right upper quadrant abdominal pain. EXAM: ULTRASOUND ABDOMEN LIMITED RIGHT UPPER QUADRANT COMPARISON:  Prior CT from 09/24/2018 FINDINGS: Gallbladder: Internal sludge seen within the gallbladder without frank shadowing echogenic stones. Gallbladder wall mildly thickened up to 5 mm. No free pericholecystic fluid. No sonographic Murphy sign indicated by sonographer. Common bile duct: Diameter: 8 mm Liver: No focal lesion identified. Within normal limits in parenchymal echogenicity. Portal vein is patent on color Doppler imaging with normal direction of blood flow towards the liver. IMPRESSION: 1. Gallbladder sludge with mild gallbladder wall thickening. No cholelithiasis or additional sonographic features to suggest acute cholecystitis. 2. No biliary dilatation. Electronically Signed   By: Jeannine Boga M.D.   On: 09/25/2018 01:12    Impression: CT abdomen and pelvis with contrast from 09/24/2018 showed no definite common bile duct stone, atrophic pancreas  This was followed by an ultrasound of the abdomen performed on 09/25/2018 which showed sludge within gallbladder, without frank shadowing, gallbladder wall mildly thickened up to 5 mm, no pericholecystic fluid, CBD normal at 8 mm,biliary dilatation.  CT angio of chest/abdomen/pelvis performed for possible dissection at 4:22 AM showed a 10 mm high density at distal common bile duct, CBD measuring 1 cm,probable 1 cm distal CBD stone   Changes of constipation   Plan: Except for minimally elevated T bili of 1.4/1.8, rest of the LFTs( AST/AST/ALP) are normal including lipase Recommend MRCP to evaluate biliary tree, if CBD  stone is confirmed will proceed with an ERCP Currently on normal saline at 100 mL an hour, has  received 4 mg IV Dilaudid, 4 mg IV Haldol, 6 mg IV morphine and is on Zofran and Compazine as needed for nausea/vomiting  Patient needs treatment for constipation, when he is not nothing by mouth, recommend starting laxatives   LOS: 0 days   Ronnette Juniper, MD 09/25/2018, 10:47 AM  Pager [phone] If no answer or after 5 PM call [phone]

## 2018-09-26 ENCOUNTER — Encounter (HOSPITAL_COMMUNITY): Payer: Self-pay | Admitting: Anesthesiology

## 2018-09-26 ENCOUNTER — Encounter (HOSPITAL_COMMUNITY)
Admission: EM | Disposition: A | Discharge status: Skilled Nursing Facility | Payer: Self-pay | Source: Home / Self Care | Attending: Internal Medicine

## 2018-09-26 LAB — CBC
HCT: 36.9 % — ABNORMAL LOW (ref 39.0–52.0)
Hemoglobin: 13.4 g/dL (ref 13.0–17.0)
MCH: 32.2 pg (ref 26.0–34.0)
MCHC: 36.3 g/dL — ABNORMAL HIGH (ref 30.0–36.0)
MCV: 88.7 fL (ref 80.0–100.0)
Platelets: 120 10*3/uL — ABNORMAL LOW (ref 150–400)
RBC: 4.16 MIL/uL — ABNORMAL LOW (ref 4.22–5.81)
RDW: 13.3 % (ref 11.5–15.5)
WBC: 11.1 10*3/uL — ABNORMAL HIGH (ref 4.0–10.5)
nRBC: 0 % (ref 0.0–0.2)

## 2018-09-26 LAB — COMPREHENSIVE METABOLIC PANEL
ALT: 61 U/L — ABNORMAL HIGH (ref 0–44)
AST: 160 U/L — ABNORMAL HIGH (ref 15–41)
Albumin: 3.9 g/dL (ref 3.5–5.0)
Alkaline Phosphatase: 119 U/L (ref 38–126)
Anion gap: 14 (ref 5–15)
BUN: 17 mg/dL (ref 8–23)
CO2: 19 mmol/L — ABNORMAL LOW (ref 22–32)
Calcium: 9.3 mg/dL (ref 8.9–10.3)
Chloride: 107 mmol/L (ref 98–111)
Creatinine, Ser: 0.72 mg/dL (ref 0.61–1.24)
GFR calc Af Amer: >60 mL/min (ref >60)
GFR calc non Af Amer: >60 mL/min (ref >60)
Glucose, Bld: 157 mg/dL — ABNORMAL HIGH (ref 70–99)
Potassium: 3.3 mmol/L — ABNORMAL LOW (ref 3.5–5.1)
Sodium: 140 mmol/L (ref 135–145)
Total Bilirubin: 3.7 mg/dL — ABNORMAL HIGH (ref 0.3–1.2)
Total Protein: 6.8 g/dL (ref 6.5–8.1)

## 2018-09-26 LAB — LIPASE, BLOOD: Lipase: 46 U/L (ref 11–51)

## 2018-09-26 LAB — MAGNESIUM: Magnesium: 1.8 mg/dL (ref 1.7–2.4)

## 2018-09-26 LAB — SURGICAL PCR SCREEN
MRSA, PCR: NEGATIVE
Staphylococcus aureus: NEGATIVE

## 2018-09-26 SURGERY — LAPAROSCOPIC CHOLECYSTECTOMY
Anesthesia: General

## 2018-09-26 MED ORDER — HYDRALAZINE HCL 20 MG/ML IJ SOLN
10.0000 mg | Freq: Four times a day (QID) | INTRAMUSCULAR | Status: DC | PRN
  Administered 2018-09-26 – 2018-09-30 (×4): 10 mg via INTRAVENOUS
  Filled 2018-09-26 (×5): qty 1

## 2018-09-26 MED ORDER — HALOPERIDOL LACTATE 5 MG/ML IJ SOLN
2.0000 mg | Freq: Four times a day (QID) | INTRAMUSCULAR | Status: DC | PRN
  Administered 2018-09-26: 2 mg via INTRAVENOUS
  Filled 2018-09-26: qty 1

## 2018-09-26 MED ORDER — LORAZEPAM 2 MG/ML IJ SOLN
1.0000 mg | Freq: Once | INTRAMUSCULAR | Status: AC
  Administered 2018-09-26: 1 mg via INTRAVENOUS
  Filled 2018-09-26: qty 1

## 2018-09-26 MED ORDER — HEPARIN SODIUM (PORCINE) 5000 UNIT/ML IJ SOLN: 5000.0000 [IU] | Freq: Three times a day (TID) | INTRAMUSCULAR | Status: DC

## 2018-09-26 MED ORDER — MORPHINE SULFATE (PF) 2 MG/ML IV SOLN: 1.0000 mg | Freq: Once | INTRAVENOUS | Status: DC

## 2018-09-26 MED ORDER — MIDAZOLAM HCL 2 MG/2ML IJ SOLN
INTRAMUSCULAR | Status: AC
  Filled 2018-09-26: qty 2

## 2018-09-26 MED ORDER — POTASSIUM CHLORIDE CRYS ER 20 MEQ PO TBCR
40.0000 meq | EXTENDED_RELEASE_TABLET | Freq: Once | ORAL | Status: DC
  Filled 2018-09-26: qty 2

## 2018-09-26 MED ORDER — SODIUM CHLORIDE 0.9 % IV SOLN: INTRAVENOUS | Status: DC

## 2018-09-26 MED ORDER — OXYCODONE HCL ER 40 MG PO T12A
40.0000 mg | EXTENDED_RELEASE_TABLET | Freq: Two times a day (BID) | ORAL | Status: DC
  Administered 2018-09-26 – 2018-09-27 (×3): 40 mg via ORAL
  Filled 2018-09-26: qty 1
  Filled 2018-09-26: qty 4
  Filled 2018-09-26: qty 1

## 2018-09-26 MED ORDER — BUPIVACAINE HCL (PF) 0.25 % IJ SOLN
INTRAMUSCULAR | Status: AC
  Filled 2018-09-26: qty 30

## 2018-09-26 MED ORDER — QUETIAPINE FUMARATE 50 MG PO TABS
50.0000 mg | ORAL_TABLET | Freq: Two times a day (BID) | ORAL | Status: DC
  Administered 2018-09-26 (×2): 50 mg via ORAL
  Filled 2018-09-26: qty 1
  Filled 2018-09-26: qty 2
  Filled 2018-09-26 (×2): qty 1
  Filled 2018-09-26: qty 2

## 2018-09-26 MED ORDER — HALOPERIDOL LACTATE 5 MG/ML IJ SOLN
5.0000 mg | Freq: Once | INTRAMUSCULAR | Status: AC
  Administered 2018-09-26: 5 mg via INTRAMUSCULAR
  Filled 2018-09-26: qty 1

## 2018-09-26 MED ORDER — AMLODIPINE BESYLATE 10 MG PO TABS
10.0000 mg | ORAL_TABLET | Freq: Every day | ORAL | Status: DC
  Administered 2018-09-26: 10 mg via ORAL
  Filled 2018-09-26: qty 1

## 2018-09-26 MED ORDER — HEPARIN SODIUM (PORCINE) 5000 UNIT/ML IJ SOLN
5000.0000 [IU] | Freq: Three times a day (TID) | INTRAMUSCULAR | Status: DC
  Administered 2018-09-26 – 2018-10-06 (×29): 5000 [IU] via SUBCUTANEOUS
  Filled 2018-09-26 (×29): qty 1

## 2018-09-26 MED ORDER — CARVEDILOL 3.125 MG PO TABS
3.1250 mg | ORAL_TABLET | Freq: Two times a day (BID) | ORAL | Status: DC
  Administered 2018-09-26 – 2018-09-27 (×2): 3.125 mg via ORAL
  Filled 2018-09-26 (×2): qty 1

## 2018-09-26 MED ORDER — KCL IN DEXTROSE-NACL 30-5-0.45 MEQ/L-%-% IV SOLN
INTRAVENOUS | Status: DC
  Administered 2018-09-26: 12:00:00 via INTRAVENOUS
  Filled 2018-09-26 (×2): qty 1000

## 2018-09-26 MED ORDER — MORPHINE SULFATE (PF) 2 MG/ML IV SOLN
2.0000 mg | Freq: Four times a day (QID) | INTRAVENOUS | Status: DC | PRN
  Administered 2018-09-26 – 2018-09-27 (×4): 2 mg via INTRAVENOUS
  Filled 2018-09-26 (×3): qty 1

## 2018-09-26 MED ORDER — FENTANYL CITRATE (PF) 250 MCG/5ML IJ SOLN
INTRAMUSCULAR | Status: AC
  Filled 2018-09-26: qty 5

## 2018-09-26 MED ORDER — TAMSULOSIN HCL 0.4 MG PO CAPS
0.4000 mg | ORAL_CAPSULE | Freq: Every day | ORAL | Status: DC
  Administered 2018-09-26: 0.4 mg via ORAL
  Filled 2018-09-26: qty 1

## 2018-09-26 MED ORDER — MORPHINE SULFATE (PF) 2 MG/ML IV SOLN
INTRAVENOUS | Status: AC
  Administered 2018-09-26: 2 mg via INTRAVENOUS
  Filled 2018-09-26: qty 1

## 2018-09-26 MED ORDER — HALOPERIDOL LACTATE 5 MG/ML IJ SOLN
5.0000 mg | Freq: Four times a day (QID) | INTRAMUSCULAR | Status: DC | PRN
  Administered 2018-09-26 – 2018-09-27 (×3): 5 mg via INTRAVENOUS
  Filled 2018-09-26 (×4): qty 1

## 2018-09-26 MED ORDER — MAGNESIUM SULFATE IN D5W 1-5 GM/100ML-% IV SOLN
1.0000 g | Freq: Once | INTRAVENOUS | Status: AC
  Administered 2018-09-26: 1 g via INTRAVENOUS
  Filled 2018-09-26: qty 100

## 2018-09-26 MED ORDER — HALOPERIDOL LACTATE 5 MG/ML IJ SOLN
5.0000 mg | Freq: Once | INTRAMUSCULAR | Status: AC
  Administered 2018-09-26: 5 mg via INTRAVENOUS

## 2018-09-26 MED ORDER — HALOPERIDOL LACTATE 5 MG/ML IJ SOLN
INTRAMUSCULAR | Status: AC
  Administered 2018-09-26: 5 mg
  Filled 2018-09-26: qty 1

## 2018-09-26 MED ORDER — POTASSIUM CHLORIDE 10 MEQ/100ML IV SOLN
10.0000 meq | INTRAVENOUS | Status: AC
  Administered 2018-09-26 (×2): 10 meq via INTRAVENOUS
  Filled 2018-09-26 (×2): qty 100

## 2018-09-26 NOTE — Progress Notes (Signed)
Patient arrived to Short Stay. I introduced myself. Dr. Rosendo Gros at bedside OR case delayed. Notified sending RN from 5W of same.

## 2018-09-26 NOTE — Progress Notes (Signed)
1 Day Post-Op   Subjective/Chief Complaint: Rolling around in bed.  Very fidgety.  Denies pain.  Family at bedside.    Objective: Vital signs in last 24 hours: Temp:  [98.1 F (36.7 C)-100 F (37.8 C)] 98.4 F (36.9 C) (02/14 1946) Pulse Rate:  [88-112] 112 (02/15 0900) Resp:  [12-23] 15 (02/15 0339) BP: (147-213)/(80-128) 166/122 (02/15 0900) SpO2:  [92 %-98 %] 97 % (02/15 0900) Last BM Date: (unable to assess; clt disoriented)  Intake/Output from previous day: 02/14 0701 - 02/15 0700 In: 1507.7 [I.V.:1394.8; IV Piggyback:112.9] Out: 2600 [Urine:2575; Blood:25] Intake/Output this shift: Total I/O In: -  Out: 600 [Urine:600]  GI: abnormal findings:  Soft nontender negative Murphy sign.  Neuro: Disoriented to time place.  Very fidgety.  Lab Results:  Recent Labs    09/25/18 0555 09/26/18 0456  WBC 11.5* 11.1*  HGB 13.6 13.4  HCT 38.6* 36.9*  PLT 142* 120*   BMET Recent Labs    09/25/18 0555 09/26/18 0456  NA 141 140  K 3.4* 3.3*  CL 109 107  CO2 17* 19*  GLUCOSE 131* 157*  BUN 18 17  CREATININE 1.13 0.72  CALCIUM 9.6 9.3   PT/INR No results for input(s): LABPROT, INR in the last 72 hours. ABG No results for input(s): PHART, HCO3 in the last 72 hours.  Invalid input(s): PCO2, PO2  Studies/Results: Dg Chest 2 View  Result Date: 09/24/2018 CLINICAL DATA:  Chest pain.  Nausea, vomiting. EXAM: CHEST - 2 VIEW COMPARISON:  10/26/2017 FINDINGS: Large hiatal hernia. Heart is borderline in size. No confluent airspace opacity, effusion or edema. No acute bony abnormality. IMPRESSION: Large hiatal hernia.  Borderline heart size.  No active disease. Electronically Signed   By: Rolm Baptise M.D.   On: 09/24/2018 21:17   Ct Abdomen Pelvis W Contrast  Result Date: 09/24/2018 CLINICAL DATA:  Abdominal pain EXAM: CT ABDOMEN AND PELVIS WITH CONTRAST TECHNIQUE: Multidetector CT imaging of the abdomen and pelvis was performed using the standard protocol following bolus  administration of intravenous contrast. CONTRAST:  133mL OMNIPAQUE 300 COMPARISON:  None. FINDINGS: Lower chest: Lung bases are free of acute infiltrate or sizable effusion. Coronary calcifications are identified. Moderate sized hiatal hernia is noted. Hepatobiliary: Gallbladder is well distended. The liver is within normal limits. No definitive common bile duct stone is seen. Pancreas: The pancreas is predominately atrophic. Spleen: Spleen is within normal limits. Adrenals/Urinary Tract: Adrenal glands are unremarkable. Kidneys demonstrate a normal enhancement pattern bilaterally. Normal excretion of contrast is seen. Bladder is well distended. Stomach/Bowel: Fecal material is noted throughout the colon consistent with mild constipation. The appendix is within normal limits. No small bowel inflammatory changes are seen. The stomach demonstrates a hiatal hernia. Vascular/Lymphatic: Aortic atherosclerosis. No enlarged abdominal or pelvic lymph nodes. Reproductive: Prostate is unremarkable. Other: No abdominal wall hernia or abnormality. No abdominopelvic ascites. Musculoskeletal: Degenerative changes of the lumbar spine are noted. No acute compression deformity is seen. Scoliosis concave to the left is noted in the mid lumbar spine. IMPRESSION: Changes of constipation.  No other focal abnormality is noted Electronically Signed   By: Inez Catalina M.D.   On: 09/24/2018 22:48   Mr Abdomen Mrcp Wo Contrast  Result Date: 09/25/2018 CLINICAL DATA:  Evaluate for distal common bile duct stone. EXAM: MRI ABDOMEN WITHOUT CONTRAST  (INCLUDING MRCP) TECHNIQUE: Multiplanar multisequence MR imaging of the abdomen was performed. Heavily T2-weighted images of the biliary and pancreatic ducts were obtained, and three-dimensional MRCP images were rendered by  post processing. COMPARISON:  Chest CT 09/25/2018 and abdominal CT scan 09/24/2018. Ultrasound examination 09/25/2018 FINDINGS: Examination is quite limited due to respiratory  motion. Lower chest: The lung bases are grossly clear. Moderate eventration of the right hemidiaphragm. Mild cardiac enlargement but no pericardial effusion. Hepatobiliary: No obvious focal hepatic lesions. There is mild intrahepatic biliary dilatation and mild common bile duct dilatation, measuring up to 10 mm. The gallbladder is moderately distended with maximum transverse diameter of 6.1 cm. A few small layering gallstones are also noted. No gallbladder wall thickening or pericholecystic inflammatory changes or fluid to suggest acute cholecystitis. There is an 8 x 7 mm distal common bile duct stone. Pancreas:  No obvious mass, inflammation or ductal dilatation. Spleen:  Borderline splenomegaly.  No focal lesions. Adrenals/Urinary Tract: The adrenal glands and kidneys are grossly normal. Stomach/Bowel: Visualized portions within the abdomen are unremarkable. Vascular/Lymphatic: No pathologically enlarged lymph nodes identified. No abdominal aortic aneurysm demonstrated. Other:  No ascites or abdominal wall hernia. Musculoskeletal: No significant bony findings. Lower lumbar scoliosis noted. IMPRESSION: 1. 8 x 7 mm distal common bile duct stone. 2. Moderate gallbladder distension and mild common bile duct dilatation (10 mm). Small gallstones noted the gallbladder. 3. Limited examination but no gross abnormality involving the liver, pancreas or spleen. Mild splenomegaly. Electronically Signed   By: Marijo Sanes M.D.   On: 09/25/2018 13:17   Dg Ercp Biliary & Pancreatic Ducts  Result Date: 09/26/2018 CLINICAL DATA:  Choledocholithiasis. EXAM: ERCP TECHNIQUE: Multiple spot images obtained with the fluoroscopic device and submitted for interpretation post-procedure. COMPARISON:  MRCP-earlier same day FLUOROSCOPY TIME:  5 minutes, 10 seconds (48 mGy) FINDINGS: Twelve spot intraoperative fluoroscopic images of the right upper abdominal quadrant during ERCP are provided for review. Initial image demonstrates an ERCP  probe overlying the right upper abdominal quadrant with potential cannulation of the pancreatic duct. Subsequent images demonstrate opacification of the common bile duct. There is an apparent nonocclusive filling defect within distal aspect of the CBD (image 11), not definitely seen on completion image (image 12). IMPRESSION: ERCP with findings suggestive of nonocclusive choledocholithiasis with subsequent presumed stone extraction. These images were submitted for radiologic interpretation only. Please see the procedural report for the amount of contrast and the fluoroscopy time utilized. Electronically Signed   By: Sandi Mariscal M.D.   On: 09/26/2018 08:32   Ct Angio Chest/abd/pel For Dissection W And/or W/wo  Result Date: 09/25/2018 CLINICAL DATA:  Chest pain with acute aortic syndrome suspected. Patient already received contrast tonight but has normal renal function, is not diabetic, and is to be hydrated. A reduced dose was used. EXAM: CT ANGIOGRAPHY CHEST, ABDOMEN AND PELVIS TECHNIQUE: Multidetector CT imaging through the chest, abdomen and pelvis was performed using the standard protocol during bolus administration of intravenous contrast. Multiplanar reconstructed images and MIPs were obtained and reviewed to evaluate the vascular anatomy. CONTRAST:  40mL OMNIPAQUE IOHEXOL 300 MG/ML  SOLN COMPARISON:  Abdomen and pelvis CT from earlier today. FINDINGS: CTA CHEST FINDINGS Cardiovascular: Noncontrast phase shows no intramural hematoma of the aorta. No cardiomegaly or pericardial effusion. No aortic dissection inflammatory wall thickening on postcontrast imaging. Partial coverage of the right proximal subclavian shows likely high-grade stenosis. There is limited opacification of the pulmonary arteries with no central filling defect noted. Mediastinum/Nodes: Negative for adenopathy.  Large hiatal hernia. Lungs/Pleura: Central airways are clear. There is no edema, consolidation, effusion, or pneumothorax.  Musculoskeletal: No acute or aggressive finding. Spinal degeneration and dextroscoliosis. Review of the MIP images  confirms the above findings. CTA ABDOMEN AND PELVIS FINDINGS VASCULAR Aorta: Atheromatous plaque.  No aneurysm or dissection. Celiac: Patent without evidence of aneurysm, dissection, vasculitis or significant stenosis. SMA: Minimal atheromatous changes. No branch occlusion, beading, or flow limiting stenosis. Renals: Mild calcified plaque at the renal artery ostia. No stenosis or aneurysm seen. IMA: Patent Inflow: Atherosclerosis Veins: Negative Review of the MIP images confirms the above findings. NON-VASCULAR Hepatobiliary: Mild altered perfusion about the gallbladder fossa better evaluated on prior.Full gallbladder which was evaluated by right upper quadrant ultrasound earlier. There is a 10 mm high-density at the distal common bile duct. The common bile duct measures 1 cm in diameter. Pancreas: Prominent fatty atrophy. Spleen: Prominent craniocaudal span but no thickening. Adrenals/Urinary Tract: Negative adrenals. No hydronephrosis or stone. Contrast in the bladder from prior study showing mild trabeculation. Stomach/Bowel: Formed stool throughout the tortuous colon. No obstruction or bowel inflammation is seen. Hiatal hernia as noted above. Lymphatic: No mass or adenopathy. Reproductive:Enlarged prostate Other: No ascites or pneumoperitoneum. Musculoskeletal: Advanced spinal degeneration with dextroscoliosis. 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 Review of the MIP images confirms the above findings. IMPRESSION: 1. Probable 1 cm distal CBD stone 2. No findings of acute aortic syndrome. 3. 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 4. Generalized stool retention. Electronically Signed   By: Monte Fantasia M.D.   On: 09/25/2018 04:43   US Abdomen Limited Ruq  Result Date: 09/25/2018 CLINICAL DATA:  Initial evaluation for acute right  upper quadrant abdominal pain. EXAM: ULTRASOUND ABDOMEN LIMITED RIGHT UPPER QUADRANT COMPARISON:  Prior CT from 09/24/2018 FINDINGS: Gallbladder: Internal sludge seen within the gallbladder without frank shadowing echogenic stones. Gallbladder wall mildly thickened up to 5 mm. No free pericholecystic fluid. No sonographic Murphy sign indicated by sonographer. Common bile duct: Diameter: 8 mm Liver: No focal lesion identified. Within normal limits in parenchymal echogenicity. Portal vein is patent on color Doppler imaging with normal direction of blood flow towards the liver. IMPRESSION: 1. Gallbladder sludge with mild gallbladder wall thickening. No cholelithiasis or additional sonographic features to suggest acute cholecystitis. 2. No biliary dilatation. Electronically Signed   By: Jeannine Boga M.D.   On: 09/25/2018 01:12    Anti-infectives: Anti-infectives (From admission, onward)   Start     Dose/Rate Route Frequency Ordered Stop   09/25/18 1200  piperacillin-tazobactam (ZOSYN) IVPB 3.375 g     3.375 g 12.5 mL/hr over 240 Minutes Intravenous Every 8 hours 09/25/18 0502     09/25/18 0515  piperacillin-tazobactam (ZOSYN) IVPB 3.375 g     3.375 g 100 mL/hr over 30 Minutes Intravenous  Once 09/25/18 0501 09/25/18 0844      Assessment/Plan: Choledocholithiasis  Status post ERCP  Patient is very fidgety and seems agitated with tachycardia and hypertension.  Not sure if cholecystectomy today would benefit him and it may need to be done at a later time.  Defer to Dr. Rosendo Gros for final  Call but given his present state it may be best to wait.   LOS: 1 day    Joyice Faster Chavon Lucarelli 09/26/2018

## 2018-09-26 NOTE — Progress Notes (Signed)
[facility] PROGRESS NOTE Subjective Patient having pain this morning.  Had ERCP yesterday with sphincterotomy and stone extraction.  Pancreatic stent was placed but the pancreatic duct was not cannulated.  He is scheduled to go later today for laparoscopic cholecystectomy  Objective: Vital signs in last 24 hours: Temp:  [98.1 F (36.7 C)-100 F (37.8 C)] 98.4 F (36.9 C) (02/14 1946) Pulse Rate:  [77-104] 102 (02/15 0339) Resp:  [12-23] 15 (02/15 0339) BP: (138-213)/(73-128) 178/104 (02/15 0339) SpO2:  [92 %-98 %] 97 % (02/15 0339)    Intake/Output from previous day: 02/14 0701 - 02/15 0700 In: 1507.7 [I.V.:1394.8; IV Piggyback:112.9] Out: 2600 [Urine:2575; Blood:25] Intake/Output this shift: Total I/O In: -  Out: 600 [Urine:600]  PE: General--patient restrained in bed confused  Abdomen--nondistended.  Few bowel sounds present.  Appears to be tender in the right upper quadrant the remainder the abdomen is less tender.  Lab Results: Recent Labs    09/24/18 2050 09/25/18 0555 09/26/18 0456  WBC 9.0 11.5* 11.1*  HGB 13.5 13.6 13.4  HCT 38.2* 38.6* 36.9*  PLT 175 142* 120*   BMET Recent Labs    09/24/18 2050 09/24/18 2102 09/25/18 0555 09/26/18 0456  NA 138  --  141 140  K 4.0  --  3.4* 3.3*  CL 104  --  109 107  CO2 21*  --  17* 19*  CREATININE 0.80 0.60* 1.13 0.72   LFT Recent Labs    09/24/18 2058 09/25/18 0555 09/26/18 0456  PROT 6.7 7.0 6.8  AST 32 34 160*  ALT 17 17 61*  ALKPHOS 96 94 119  BILITOT 1.4* 1.8* 3.7*  BILIDIR 0.4* 0.4*  --   IBILI 1.0* 1.4*  --    PT/INR No results for input(s): LABPROT, INR in the last 72 hours. PANCREAS Recent Labs    09/24/18 2058  LIPASE 23         Studies/Results: Dg Chest 2 View  Result Date: 09/24/2018 CLINICAL DATA:  Chest pain.  Nausea, vomiting. EXAM: CHEST - 2 VIEW COMPARISON:  10/26/2017 FINDINGS: Large hiatal hernia. Heart is borderline in size. No confluent airspace opacity,  effusion or edema. No acute bony abnormality. IMPRESSION: Large hiatal hernia.  Borderline heart size.  No active disease. Electronically Signed   By: Rolm Baptise M.D.   On: 09/24/2018 21:17   Ct Abdomen Pelvis W Contrast  Result Date: 09/24/2018 CLINICAL DATA:  Abdominal pain EXAM: CT ABDOMEN AND PELVIS WITH CONTRAST TECHNIQUE: Multidetector CT imaging of the abdomen and pelvis was performed using the standard protocol following bolus administration of intravenous contrast. CONTRAST:  154mL OMNIPAQUE 300 COMPARISON:  None. FINDINGS: Lower chest: Lung bases are free of acute infiltrate or sizable effusion. Coronary calcifications are identified. Moderate sized hiatal hernia is noted. Hepatobiliary: Gallbladder is well distended. The liver is within normal limits. No definitive common bile duct stone is seen. Pancreas: The pancreas is predominately atrophic. Spleen: Spleen is within normal limits. Adrenals/Urinary Tract: Adrenal glands are unremarkable. Kidneys demonstrate a normal enhancement pattern bilaterally. Normal excretion of contrast is seen. Bladder is well distended. Stomach/Bowel: Fecal material is noted throughout the colon consistent with mild constipation. The appendix is within normal limits. No small bowel inflammatory changes are seen. The stomach demonstrates a hiatal hernia. Vascular/Lymphatic: Aortic atherosclerosis. No enlarged abdominal or pelvic lymph nodes. Reproductive: Prostate is unremarkable. Other: No abdominal wall hernia or abnormality. No abdominopelvic ascites. Musculoskeletal: Degenerative changes of the lumbar spine are noted. No acute compression deformity is seen.  Scoliosis concave to the left is noted in the mid lumbar spine. IMPRESSION: Changes of constipation.  No other focal abnormality is noted Electronically Signed   By: Inez Catalina M.D.   On: 09/24/2018 22:48   Mr Abdomen Mrcp Wo Contrast  Result Date: 09/25/2018 CLINICAL DATA:  Evaluate for distal common bile  duct stone. EXAM: MRI ABDOMEN WITHOUT CONTRAST  (INCLUDING MRCP) TECHNIQUE: Multiplanar multisequence MR imaging of the abdomen was performed. Heavily T2-weighted images of the biliary and pancreatic ducts were obtained, and three-dimensional MRCP images were rendered by post processing. COMPARISON:  Chest CT 09/25/2018 and abdominal CT scan 09/24/2018. Ultrasound examination 09/25/2018 FINDINGS: Examination is quite limited due to respiratory motion. Lower chest: The lung bases are grossly clear. Moderate eventration of the right hemidiaphragm. Mild cardiac enlargement but no pericardial effusion. Hepatobiliary: No obvious focal hepatic lesions. There is mild intrahepatic biliary dilatation and mild common bile duct dilatation, measuring up to 10 mm. The gallbladder is moderately distended with maximum transverse diameter of 6.1 cm. A few small layering gallstones are also noted. No gallbladder wall thickening or pericholecystic inflammatory changes or fluid to suggest acute cholecystitis. There is an 8 x 7 mm distal common bile duct stone. Pancreas:  No obvious mass, inflammation or ductal dilatation. Spleen:  Borderline splenomegaly.  No focal lesions. Adrenals/Urinary Tract: The adrenal glands and kidneys are grossly normal. Stomach/Bowel: Visualized portions within the abdomen are unremarkable. Vascular/Lymphatic: No pathologically enlarged lymph nodes identified. No abdominal aortic aneurysm demonstrated. Other:  No ascites or abdominal wall hernia. Musculoskeletal: No significant bony findings. Lower lumbar scoliosis noted. IMPRESSION: 1. 8 x 7 mm distal common bile duct stone. 2. Moderate gallbladder distension and mild common bile duct dilatation (10 mm). Small gallstones noted the gallbladder. 3. Limited examination but no gross abnormality involving the liver, pancreas or spleen. Mild splenomegaly. Electronically Signed   By: Marijo Sanes M.D.   On: 09/25/2018 13:17   Dg Ercp Biliary & Pancreatic  Ducts  Result Date: 09/26/2018 CLINICAL DATA:  Choledocholithiasis. EXAM: ERCP TECHNIQUE: Multiple spot images obtained with the fluoroscopic device and submitted for interpretation post-procedure. COMPARISON:  MRCP-earlier same day FLUOROSCOPY TIME:  5 minutes, 10 seconds (48 mGy) FINDINGS: Twelve spot intraoperative fluoroscopic images of the right upper abdominal quadrant during ERCP are provided for review. Initial image demonstrates an ERCP probe overlying the right upper abdominal quadrant with potential cannulation of the pancreatic duct. Subsequent images demonstrate opacification of the common bile duct. There is an apparent nonocclusive filling defect within distal aspect of the CBD (image 11), not definitely seen on completion image (image 12). IMPRESSION: ERCP with findings suggestive of nonocclusive choledocholithiasis with subsequent presumed stone extraction. These images were submitted for radiologic interpretation only. Please see the procedural report for the amount of contrast and the fluoroscopy time utilized. Electronically Signed   By: Sandi Mariscal M.D.   On: 09/26/2018 08:32   Ct Angio Chest/abd/pel For Dissection W And/or W/wo  Result Date: 09/25/2018 CLINICAL DATA:  Chest pain with acute aortic syndrome suspected. Patient already received contrast tonight but has normal renal function, is not diabetic, and is to be hydrated. A reduced dose was used. EXAM: CT ANGIOGRAPHY CHEST, ABDOMEN AND PELVIS TECHNIQUE: Multidetector CT imaging through the chest, abdomen and pelvis was performed using the standard protocol during bolus administration of intravenous contrast. Multiplanar reconstructed images and MIPs were obtained and reviewed to evaluate the vascular anatomy. CONTRAST:  11mL OMNIPAQUE IOHEXOL 300 MG/ML  SOLN COMPARISON:  Abdomen and pelvis  CT from earlier today. FINDINGS: CTA CHEST FINDINGS Cardiovascular: Noncontrast phase shows no intramural hematoma of the aorta. No cardiomegaly  or pericardial effusion. No aortic dissection inflammatory wall thickening on postcontrast imaging. Partial coverage of the right proximal subclavian shows likely high-grade stenosis. There is limited opacification of the pulmonary arteries with no central filling defect noted. Mediastinum/Nodes: Negative for adenopathy.  Large hiatal hernia. Lungs/Pleura: Central airways are clear. There is no edema, consolidation, effusion, or pneumothorax. Musculoskeletal: No acute or aggressive finding. Spinal degeneration and dextroscoliosis. Review of the MIP images confirms the above findings. CTA ABDOMEN AND PELVIS FINDINGS VASCULAR Aorta: Atheromatous plaque.  No aneurysm or dissection. Celiac: Patent without evidence of aneurysm, dissection, vasculitis or significant stenosis. SMA: Minimal atheromatous changes. No branch occlusion, beading, or flow limiting stenosis. Renals: Mild calcified plaque at the renal artery ostia. No stenosis or aneurysm seen. IMA: Patent Inflow: Atherosclerosis Veins: Negative Review of the MIP images confirms the above findings. NON-VASCULAR Hepatobiliary: Mild altered perfusion about the gallbladder fossa better evaluated on prior.Full gallbladder which was evaluated by right upper quadrant ultrasound earlier. There is a 10 mm high-density at the distal common bile duct. The common bile duct measures 1 cm in diameter. Pancreas: Prominent fatty atrophy. Spleen: Prominent craniocaudal span but no thickening. Adrenals/Urinary Tract: Negative adrenals. No hydronephrosis or stone. Contrast in the bladder from prior study showing mild trabeculation. Stomach/Bowel: Formed stool throughout the tortuous colon. No obstruction or bowel inflammation is seen. Hiatal hernia as noted above. Lymphatic: No mass or adenopathy. Reproductive:Enlarged prostate Other: No ascites or pneumoperitoneum. Musculoskeletal: Advanced spinal degeneration with dextroscoliosis. 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  Review of the MIP images confirms the above findings. IMPRESSION: 1. Probable 1 cm distal CBD stone 2. No findings of acute aortic syndrome. 3. 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 4. Generalized stool retention. Electronically Signed   By: Monte Fantasia M.D.   On: 09/25/2018 04:43   US Abdomen Limited Ruq  Result Date: 09/25/2018 CLINICAL DATA:  Initial evaluation for acute right upper quadrant abdominal pain. EXAM: ULTRASOUND ABDOMEN LIMITED RIGHT UPPER QUADRANT COMPARISON:  Prior CT from 09/24/2018 FINDINGS: Gallbladder: Internal sludge seen within the gallbladder without frank shadowing echogenic stones. Gallbladder wall mildly thickened up to 5 mm. No free pericholecystic fluid. No sonographic Murphy sign indicated by sonographer. Common bile duct: Diameter: 8 mm Liver: No focal lesion identified. Within normal limits in parenchymal echogenicity. Portal vein is patent on color Doppler imaging with normal direction of blood flow towards the liver. IMPRESSION: 1. Gallbladder sludge with mild gallbladder wall thickening. No cholelithiasis or additional sonographic features to suggest acute cholecystitis. 2. No biliary dilatation. Electronically Signed   By: Jeannine Boga M.D.   On: 09/25/2018 01:12    Medications: I have reviewed the patient's current medications.  Assessment:   1.  Abdominal pain.  Patient has known cholelithiasis is going for cholecystectomy.  Pancreatic duct was not cannulated or imaged and stent was placed.  Doubt he has pancreatitis but we will add a lipase to his labs today. 2.  Cholelithiasis   Plan: 1.  For laparoscopic cholecystectomy later today. 2.  We will add lipase to labs drawn this morning if possible 3.  Patient will need KUB in 2 weeks to be sure that pancreatic stent is passed.  If not it will need to be removed endoscopically.   Nancy Fetter 09/26/2018, 9:35 AM  This note was created using  voice recognition  software. Minor errors may Have occurred unintentionally.  Pager: [phone] If no answer or after hours call [phone]

## 2018-09-26 NOTE — Progress Notes (Signed)
Pt with continued agitation and restlessness, attempting to get OOB and pull out foley catheter. Charge nurse in room. MD consulted, one time order of Haloperidol 5mg  IV obtained. Pt and family educated and reassured. RG, Stage manager

## 2018-09-26 NOTE — Progress Notes (Signed)
PROGRESS NOTE                                                                                                                                                                                                             Patient Demographics:    Riley Eaton, is a 78 y.o. male, DOB - Feb 07, 1941, DEY:[id number]  Admit date - 09/24/2018   Admitting Physician Rise Patience, MD  Outpatient Primary MD for the patient is Aletha Halim., PA-C  LOS - 1  Chief Complaint  Patient presents with  . Chest Pain       Brief Narrative  Riley Eaton is a 78 y.o. male with history of chronic low back pain, BPH was brought to the ER after patient started complaining of chest pain.  Patient's chest pain started last evening around 7 PM.  Prior to which patient had nausea vomiting, further work-up suggested that patient had more right upper quadrant pain and chest pain, he was diagnosed with a CBD stone along with bladder outlet obstruction.  GI, General surgery and urology were consulted and he was admitted to our service for further care.   Subjective:    Riley Eaton today has, No headache, No chest pain, No abdominal pain - but is quite confused this morning.  Appears to be in no distress.   Assessment  & Plan :     1.  CBD stone with obstruction and cholecystitis.  Currently on gentle IV fluids and IV Zosyn, seen by GI and underwent ERCP on 09/25/2018 by Dr. Megan Salon with pancreatic stent placement, sphincterotomy and balloon extraction.  He will require cholecystectomy defer this to general surgery.  He was due for surgery on 09/26/2018 but this was postponed due to high blood pressure agitation.  We will continue n.p.o. except meds.  2.  BPH with urinary outlet obstruction.  Urology was consulted underwent Foley placement on 09/25/2018, continue Flomax.  3. 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  Currently in mild withdrawal, home narcotics resumed at lower dose  to prevent withdrawal.  4.  Delirium and agitation.  This is hospital-acquired encephalopathy worsened by infection and some element of narcotic withdrawal.  Resume narcotics, Haldol as needed, Seroquel.  Restraints if needed.  Wife updated.  5.  Hypertension.  Worse due to delirium, placed on low-dose Coreg and Norvasc along with PRN hydralazine.  6.  Hypokalemia.  Replaced.    Family Communication  :  Wife  Code Status :  Full  Disposition Plan  :  TBD  Consults  :  GI, CCS, Urology  Procedures  :    ERCP - Pancreatic stent placement, SPHINCTEROTOMY, balloon extraction.  CT - 1. Probable 1 cm distal CBD stone 2. No findings of acute aortic syndrome. 3. 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 4. Generalized stool retention.  RUQ [place] - 1. Gallbladder sludge with mild gallbladder wall thickening. No cholelithiasis or additional sonographic features to suggest acute cholecystitis. 2. No biliary dilatation.  MRCP - 1. 8 x 7 mm distal common bile duct stone. 2. Moderate gallbladder distension and mild common bile duct dilatation (10 mm). Small gallstones noted the gallbladder. 3. Limited examination but no gross abnormality involving the liver, pancreas or spleen. Mild splenomegaly.     DVT Prophylaxis  :    Heparin   Lab Results  Component Value Date   PLT 120 (L) 09/26/2018    Diet :  Diet Order            Diet NPO time specified Except for: Sips with Meds  Diet effective midnight               Inpatient Medications Scheduled Meds: . amLODipine  10 mg Oral Daily  . [MAR Hold] bisacodyl  10 mg Rectal BID  . carvedilol  3.125 mg Oral BID WC  . [MAR Hold] polyethylene glycol  17 g Oral TID  . QUEtiapine  50 mg Oral BID  . [MAR Hold] senna  2 tablet Oral Daily  . tamsulosin  0.4 mg Oral Daily   Continuous Infusions: . sodium chloride 75 mL/hr at 09/26/18 0831  . [MAR Hold] magnesium sulfate 1 - 4 g bolus IVPB    .  [MAR Hold] piperacillin-tazobactam (ZOSYN)  IV 3.375 g (09/26/18 0444)   PRN Meds:.[MAR Hold] acetaminophen **OR** [DISCONTINUED] acetaminophen, [MAR Hold] haloperidol lactate, hydrALAZINE, [MAR Hold]  HYDROmorphone (DILAUDID) injection, [MAR Hold]  morphine injection, [DISCONTINUED] ondansetron **OR** [MAR Hold] ondansetron (ZOFRAN) IV  Antibiotics  :   Anti-infectives (From admission, onward)   Start     Dose/Rate Route Frequency Ordered Stop   09/25/18 1200  [MAR Hold]  piperacillin-tazobactam (ZOSYN) IVPB 3.375 g     (MAR Hold since Sat 09/26/2018 at 1052. Reason: Transfer to a Procedural area.)   3.375 g 12.5 mL/hr over 240 Minutes Intravenous Every 8 hours 09/25/18 0502     09/25/18 0515  piperacillin-tazobactam (ZOSYN) IVPB 3.375 g     3.375 g 100 mL/hr over 30 Minutes Intravenous  Once 09/25/18 0501 09/25/18 0844          Objective:   Vitals:   09/25/18 1915 09/25/18 1946 09/26/18 0339 09/26/18 0900  BP:  (!) 182/106 (!) 178/104 (!) 166/122  Pulse:  99 (!) 102 (!) 112  Resp:   15   Temp: 100 F (37.8 C) 98.4 F (36.9 C)    TempSrc:  Axillary    SpO2:  97% 97% 97%  Weight:      Height:        Wt Readings from Last 3 Encounters:  09/25/18 71.7 kg  07/06/18 71.7 kg  12/31/17 72.3 kg     Intake/Output Summary (Last 24 hours) at 09/26/2018 1114 Last data filed at 09/26/2018 0720 Gross per 24 hour  Intake 1457.65 ml  Output 3200 ml  Net -1742.35 ml     Physical Exam  Awake - confused, foley, No new F.N deficits, Normal affect .AT,PERRAL Supple  Neck,No JVD, No cervical lymphadenopathy appriciated.  Symmetrical Chest wall movement, Good air movement bilaterally, CTAB RRR,No Gallops,Rubs or new Murmurs, No Parasternal Heave +ve B.Sounds, Abd Soft, No tenderness, No organomegaly appriciated, No rebound - guarding or rigidity. No Cyanosis, Clubbing or edema, No new Rash or bruise      Data Review:    CBC Recent Labs  Lab 09/24/18 2050 09/25/18 0555  09/26/18 0456  WBC 9.0 11.5* 11.1*  HGB 13.5 13.6 13.4  HCT 38.2* 38.6* 36.9*  PLT 175 142* 120*  MCV 89.5 89.8 88.7  MCH 31.6 31.6 32.2  MCHC 35.3 35.2 36.3*  RDW 13.0 13.2 13.3  LYMPHSABS  --  0.9  --   MONOABS  --  0.9  --   EOSABS  --  0.0  --   BASOSABS  --  0.0  --     Chemistries  Recent Labs  Lab 09/24/18 2050 09/24/18 2058 09/24/18 2102 09/25/18 0555 09/26/18 0456  NA 138  --   --  141 140  K 4.0  --   --  3.4* 3.3*  CL 104  --   --  109 107  CO2 21*  --   --  17* 19*  GLUCOSE 165*  --   --  131* 157*  BUN 16  --   --  18 17  CREATININE 0.80  --  0.60* 1.13 0.72  CALCIUM 9.4  --   --  9.6 9.3  MG  --   --   --  1.8 1.8  AST  --  32  --  34 160*  ALT  --  17  --  17 61*  ALKPHOS  --  96  --  94 119  BILITOT  --  1.4*  --  1.8* 3.7*   ------------------------------------------------------------------------------------------------------------------ No results for input(s): CHOL, HDL, LDLCALC, TRIG, CHOLHDL, LDLDIRECT in the last 72 hours.  No results found for: HGBA1C ------------------------------------------------------------------------------------------------------------------ No results for input(s): TSH, T4TOTAL, T3FREE, THYROIDAB in the last 72 hours.  Invalid input(s): FREET3 ------------------------------------------------------------------------------------------------------------------ No results for input(s): VITAMINB12, FOLATE, FERRITIN, TIBC, IRON, RETICCTPCT in the last 72 hours.  Coagulation profile No results for input(s): INR, PROTIME in the last 168 hours.  Recent Labs    09/24/18 2100  DDIMER 0.48    Cardiac Enzymes Recent Labs  Lab 09/25/18 0555  TROPONINI 0.04*   ------------------------------------------------------------------------------------------------------------------    Component Value Date/Time   BNP 24.7 08/23/2015 1326    Micro Results Recent Results (from the past 240 hour(s))  Surgical pcr screen      Status: None   Collection Time: 09/26/18  2:11 AM  Result Value Ref Range Status   MRSA, PCR NEGATIVE NEGATIVE Final   Staphylococcus aureus NEGATIVE NEGATIVE Final    Comment: (NOTE) The Xpert SA Assay (FDA approved for NASAL specimens in patients 25 years of age and older), is one component of a comprehensive surveillance program. It is not intended to diagnose infection nor to guide or monitor treatment. Performed at [facility], [place] [address] [place], [place] [address]     Radiology Reports Dg Chest 2 View  Result Date: 09/24/2018 CLINICAL DATA:  Chest pain.  Nausea, vomiting. EXAM: CHEST - 2 VIEW COMPARISON:  10/26/2017 FINDINGS: Large hiatal hernia. Heart is borderline in size. No confluent airspace opacity, effusion or edema. No acute bony abnormality. IMPRESSION: Large hiatal hernia.  Borderline heart size.  No active disease. Electronically Signed   By: Rolm Baptise M.D.  On: 09/24/2018 21:17   Ct Abdomen Pelvis W Contrast  Result Date: 09/24/2018 CLINICAL DATA:  Abdominal pain EXAM: CT ABDOMEN AND PELVIS WITH CONTRAST TECHNIQUE: Multidetector CT imaging of the abdomen and pelvis was performed using the standard protocol following bolus administration of intravenous contrast. CONTRAST:  169mL OMNIPAQUE 300 COMPARISON:  None. FINDINGS: Lower chest: Lung bases are free of acute infiltrate or sizable effusion. Coronary calcifications are identified. Moderate sized hiatal hernia is noted. Hepatobiliary: Gallbladder is well distended. The liver is within normal limits. No definitive common bile duct stone is seen. Pancreas: The pancreas is predominately atrophic. Spleen: Spleen is within normal limits. Adrenals/Urinary Tract: Adrenal glands are unremarkable. Kidneys demonstrate a normal enhancement pattern bilaterally. Normal excretion of contrast is seen. Bladder is well distended. Stomach/Bowel: Fecal material is noted throughout the colon consistent with mild constipation. The  appendix is within normal limits. No small bowel inflammatory changes are seen. The stomach demonstrates a hiatal hernia. Vascular/Lymphatic: Aortic atherosclerosis. No enlarged abdominal or pelvic lymph nodes. Reproductive: Prostate is unremarkable. Other: No abdominal wall hernia or abnormality. No abdominopelvic ascites. Musculoskeletal: Degenerative changes of the lumbar spine are noted. No acute compression deformity is seen. Scoliosis concave to the left is noted in the mid lumbar spine. IMPRESSION: Changes of constipation.  No other focal abnormality is noted Electronically Signed   By: Inez Catalina M.D.   On: 09/24/2018 22:48   Mr Abdomen Mrcp Wo Contrast  Result Date: 09/25/2018 CLINICAL DATA:  Evaluate for distal common bile duct stone. EXAM: MRI ABDOMEN WITHOUT CONTRAST  (INCLUDING MRCP) TECHNIQUE: Multiplanar multisequence MR imaging of the abdomen was performed. Heavily T2-weighted images of the biliary and pancreatic ducts were obtained, and three-dimensional MRCP images were rendered by post processing. COMPARISON:  Chest CT 09/25/2018 and abdominal CT scan 09/24/2018. Ultrasound examination 09/25/2018 FINDINGS: Examination is quite limited due to respiratory motion. Lower chest: The lung bases are grossly clear. Moderate eventration of the right hemidiaphragm. Mild cardiac enlargement but no pericardial effusion. Hepatobiliary: No obvious focal hepatic lesions. There is mild intrahepatic biliary dilatation and mild common bile duct dilatation, measuring up to 10 mm. The gallbladder is moderately distended with maximum transverse diameter of 6.1 cm. A few small layering gallstones are also noted. No gallbladder wall thickening or pericholecystic inflammatory changes or fluid to suggest acute cholecystitis. There is an 8 x 7 mm distal common bile duct stone. Pancreas:  No obvious mass, inflammation or ductal dilatation. Spleen:  Borderline splenomegaly.  No focal lesions. Adrenals/Urinary Tract:  The adrenal glands and kidneys are grossly normal. Stomach/Bowel: Visualized portions within the abdomen are unremarkable. Vascular/Lymphatic: No pathologically enlarged lymph nodes identified. No abdominal aortic aneurysm demonstrated. Other:  No ascites or abdominal wall hernia. Musculoskeletal: No significant bony findings. Lower lumbar scoliosis noted. IMPRESSION: 1. 8 x 7 mm distal common bile duct stone. 2. Moderate gallbladder distension and mild common bile duct dilatation (10 mm). Small gallstones noted the gallbladder. 3. Limited examination but no gross abnormality involving the liver, pancreas or spleen. Mild splenomegaly. Electronically Signed   By: Marijo Sanes M.D.   On: 09/25/2018 13:17   Dg Ercp Biliary & Pancreatic Ducts  Result Date: 09/26/2018 CLINICAL DATA:  Choledocholithiasis. EXAM: ERCP TECHNIQUE: Multiple spot images obtained with the fluoroscopic device and submitted for interpretation post-procedure. COMPARISON:  MRCP-earlier same day FLUOROSCOPY TIME:  5 minutes, 10 seconds (48 mGy) FINDINGS: Twelve spot intraoperative fluoroscopic images of the right upper abdominal quadrant during ERCP are provided for review. Initial image demonstrates  an ERCP probe overlying the right upper abdominal quadrant with potential cannulation of the pancreatic duct. Subsequent images demonstrate opacification of the common bile duct. There is an apparent nonocclusive filling defect within distal aspect of the CBD (image 11), not definitely seen on completion image (image 12). IMPRESSION: ERCP with findings suggestive of nonocclusive choledocholithiasis with subsequent presumed stone extraction. These images were submitted for radiologic interpretation only. Please see the procedural report for the amount of contrast and the fluoroscopy time utilized. Electronically Signed   By: Sandi Mariscal M.D.   On: 09/26/2018 08:32   Ct Angio Chest/abd/pel For Dissection W And/or W/wo  Result Date:  09/25/2018 CLINICAL DATA:  Chest pain with acute aortic syndrome suspected. Patient already received contrast tonight but has normal renal function, is not diabetic, and is to be hydrated. A reduced dose was used. EXAM: CT ANGIOGRAPHY CHEST, ABDOMEN AND PELVIS TECHNIQUE: Multidetector CT imaging through the chest, abdomen and pelvis was performed using the standard protocol during bolus administration of intravenous contrast. Multiplanar reconstructed images and MIPs were obtained and reviewed to evaluate the vascular anatomy. CONTRAST:  2mL OMNIPAQUE IOHEXOL 300 MG/ML  SOLN COMPARISON:  Abdomen and pelvis CT from earlier today. FINDINGS: CTA CHEST FINDINGS Cardiovascular: Noncontrast phase shows no intramural hematoma of the aorta. No cardiomegaly or pericardial effusion. No aortic dissection inflammatory wall thickening on postcontrast imaging. Partial coverage of the right proximal subclavian shows likely high-grade stenosis. There is limited opacification of the pulmonary arteries with no central filling defect noted. Mediastinum/Nodes: Negative for adenopathy.  Large hiatal hernia. Lungs/Pleura: Central airways are clear. There is no edema, consolidation, effusion, or pneumothorax. Musculoskeletal: No acute or aggressive finding. Spinal degeneration and dextroscoliosis. Review of the MIP images confirms the above findings. CTA ABDOMEN AND PELVIS FINDINGS VASCULAR Aorta: Atheromatous plaque.  No aneurysm or dissection. Celiac: Patent without evidence of aneurysm, dissection, vasculitis or significant stenosis. SMA: Minimal atheromatous changes. No branch occlusion, beading, or flow limiting stenosis. Renals: Mild calcified plaque at the renal artery ostia. No stenosis or aneurysm seen. IMA: Patent Inflow: Atherosclerosis Veins: Negative Review of the MIP images confirms the above findings. NON-VASCULAR Hepatobiliary: Mild altered perfusion about the gallbladder fossa better evaluated on prior.Full gallbladder  which was evaluated by right upper quadrant ultrasound earlier. There is a 10 mm high-density at the distal common bile duct. The common bile duct measures 1 cm in diameter. Pancreas: Prominent fatty atrophy. Spleen: Prominent craniocaudal span but no thickening. Adrenals/Urinary Tract: Negative adrenals. No hydronephrosis or stone. Contrast in the bladder from prior study showing mild trabeculation. Stomach/Bowel: Formed stool throughout the tortuous colon. No obstruction or bowel inflammation is seen. Hiatal hernia as noted above. Lymphatic: No mass or adenopathy. Reproductive:Enlarged prostate Other: No ascites or pneumoperitoneum. Musculoskeletal: Advanced spinal degeneration with dextroscoliosis. 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 Review of the MIP images confirms the above findings. IMPRESSION: 1. Probable 1 cm distal CBD stone 2. No findings of acute aortic syndrome. 3. 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 4. Generalized stool retention. Electronically Signed   By: Monte Fantasia M.D.   On: 09/25/2018 04:43   US Abdomen Limited Ruq  Result Date: 09/25/2018 CLINICAL DATA:  Initial evaluation for acute right upper quadrant abdominal pain. EXAM: ULTRASOUND ABDOMEN LIMITED RIGHT UPPER QUADRANT COMPARISON:  Prior CT from 09/24/2018 FINDINGS: Gallbladder: Internal sludge seen within the gallbladder without frank shadowing echogenic stones. Gallbladder wall mildly thickened up to 5 mm. No free pericholecystic fluid. No  sonographic Percell Miller sign indicated by sonographer. Common bile duct: Diameter: 8 mm Liver: No focal lesion identified. Within normal limits in parenchymal echogenicity. Portal vein is patent on color Doppler imaging with normal direction of blood flow towards the liver. IMPRESSION: 1. Gallbladder sludge with mild gallbladder wall thickening. No cholelithiasis or additional sonographic features to suggest acute cholecystitis. 2.  No biliary dilatation. Electronically Signed   By: Jeannine Boga M.D.   On: 09/25/2018 01:12    Time Spent in minutes  30   Lala Lund M.D on 09/26/2018 at 11:14 AM  To page go to [email or web address] - password [facility]

## 2018-09-26 NOTE — Progress Notes (Addendum)
Pt continues to display signs of agitation and confusion. Charge nurse in room. Pt attempting to get OOB and "go home". Unable to speak in coherent sentences. Restless and disoriented to time/place/person/situation. MD notified and arrived/evaluated pt at bedside. Per MD, continue with current plan of care. No new orders obtained. RG, Stage manager

## 2018-09-26 NOTE — Progress Notes (Cosign Needed)
Pt resting in bed, restraints in place. Accompanied by son. Slt less agitated. Continue with current plan of care. RG, Stage manager

## 2018-09-26 NOTE — Progress Notes (Signed)
1 Day Post-Op  Subjective: Mr. Riley Eaton is confused and agitated this morning but his catheter is draining well.    ROS:  Review of Systems  Unable to perform ROS: Mental status change    Anti-infectives: Anti-infectives (From admission, onward)   Start     Dose/Rate Route Frequency Ordered Stop   09/25/18 1200  piperacillin-tazobactam (ZOSYN) IVPB 3.375 g     3.375 g 12.5 mL/hr over 240 Minutes Intravenous Every 8 hours 09/25/18 0502     09/25/18 0515  piperacillin-tazobactam (ZOSYN) IVPB 3.375 g     3.375 g 100 mL/hr over 30 Minutes Intravenous  Once 09/25/18 0501 09/25/18 0844      Current Facility-Administered Medications  Medication Dose Route Frequency Provider Last Rate Last Dose  . acetaminophen (TYLENOL) tablet 650 mg  650 mg Oral Q6H PRN Ronnette Juniper, MD       Or  . acetaminophen (TYLENOL) suppository 650 mg  650 mg Rectal Q6H PRN Ronnette Juniper, MD      . bisacodyl (DULCOLAX) suppository 10 mg  10 mg Rectal BID Ronnette Juniper, MD   10 mg at 09/25/18 2153  . hydrALAZINE (APRESOLINE) injection 5 mg  5 mg Intravenous Q4H PRN Ronnette Juniper, MD   5 mg at 09/25/18 2250  . HYDROmorphone (DILAUDID) injection 2 mg  2 mg Intravenous Q2H PRN Ronnette Juniper, MD   2 mg at 09/26/18 0649  . ondansetron (ZOFRAN) tablet 4 mg  4 mg Oral Q6H PRN Ronnette Juniper, MD       Or  . ondansetron [facility]) injection 4 mg  4 mg Intravenous Q6H PRN Ronnette Juniper, MD   4 mg at 09/25/18 1300  . piperacillin-tazobactam (ZOSYN) IVPB 3.375 g  3.375 g Intravenous Morrell Riddle, MD 12.5 mL/hr at 09/26/18 0444 3.375 g at 09/26/18 0444  . polyethylene glycol (MIRALAX / GLYCOLAX) packet 17 g  17 g Oral TID Ronnette Juniper, MD      . senna [facility]) tablet 17.2 mg  2 tablet Oral Daily Ronnette Juniper, MD         Objective: Vital signs in last 24 hours: Temp:  [98.1 F (36.7 C)-100 F (37.8 C)] 98.4 F (36.9 C) (02/14 1946) Pulse Rate:  [68-104] 102 (02/15 0339) Resp:  [12-23] 15 (02/15 0339) BP: (110-213)/(58-128) 178/104  (02/15 0339) SpO2:  [92 %-98 %] 97 % (02/15 0339)  Intake/Output from previous day: 02/14 0701 - 02/15 0700 In: 1507.7 [I.V.:1394.8; IV Piggyback:112.9] Out: 2600 [Urine:2575; Blood:25] Intake/Output this shift: No intake/output data recorded.   Physical Exam Vitals signs reviewed.  Constitutional:      Comments: WD, WN in NAD but confused and a bit agitated  Genitourinary:    Comments: Foley draining well.     Lab Results:  Recent Labs    09/25/18 0555 09/26/18 0456  WBC 11.5* 11.1*  HGB 13.6 13.4  HCT 38.6* 36.9*  PLT 142* 120*   BMET Recent Labs    09/25/18 0555 09/26/18 0456  NA 141 140  K 3.4* 3.3*  CL 109 107  CO2 17* 19*  GLUCOSE 131* 157*  BUN 18 17  CREATININE 1.13 0.72  CALCIUM 9.6 9.3   PT/INR No results for input(s): LABPROT, INR in the last 72 hours. ABG No results for input(s): PHART, HCO3 in the last 72 hours.  Invalid input(s): PCO2, PO2  Studies/Results: Dg Chest 2 View  Result Date: 09/24/2018 CLINICAL DATA:  Chest pain.  Nausea, vomiting. EXAM: CHEST - 2 VIEW COMPARISON:  10/26/2017  FINDINGS: Large hiatal hernia. Heart is borderline in size. No confluent airspace opacity, effusion or edema. No acute bony abnormality. IMPRESSION: Large hiatal hernia.  Borderline heart size.  No active disease. Electronically Signed   By: Rolm Baptise M.D.   On: 09/24/2018 21:17   Ct Abdomen Pelvis W Contrast  Result Date: 09/24/2018 CLINICAL DATA:  Abdominal pain EXAM: CT ABDOMEN AND PELVIS WITH CONTRAST TECHNIQUE: Multidetector CT imaging of the abdomen and pelvis was performed using the standard protocol following bolus administration of intravenous contrast. CONTRAST:  176mL OMNIPAQUE 300 COMPARISON:  None. FINDINGS: Lower chest: Lung bases are free of acute infiltrate or sizable effusion. Coronary calcifications are identified. Moderate sized hiatal hernia is noted. Hepatobiliary: Gallbladder is well distended. The liver is within normal limits. No  definitive common bile duct stone is seen. Pancreas: The pancreas is predominately atrophic. Spleen: Spleen is within normal limits. Adrenals/Urinary Tract: Adrenal glands are unremarkable. Kidneys demonstrate a normal enhancement pattern bilaterally. Normal excretion of contrast is seen. Bladder is well distended. Stomach/Bowel: Fecal material is noted throughout the colon consistent with mild constipation. The appendix is within normal limits. No small bowel inflammatory changes are seen. The stomach demonstrates a hiatal hernia. Vascular/Lymphatic: Aortic atherosclerosis. No enlarged abdominal or pelvic lymph nodes. Reproductive: Prostate is unremarkable. Other: No abdominal wall hernia or abnormality. No abdominopelvic ascites. Musculoskeletal: Degenerative changes of the lumbar spine are noted. No acute compression deformity is seen. Scoliosis concave to the left is noted in the mid lumbar spine. IMPRESSION: Changes of constipation.  No other focal abnormality is noted Electronically Signed   By: Inez Catalina M.D.   On: 09/24/2018 22:48   Mr Abdomen Mrcp Wo Contrast  Result Date: 09/25/2018 CLINICAL DATA:  Evaluate for distal common bile duct stone. EXAM: MRI ABDOMEN WITHOUT CONTRAST  (INCLUDING MRCP) TECHNIQUE: Multiplanar multisequence MR imaging of the abdomen was performed. Heavily T2-weighted images of the biliary and pancreatic ducts were obtained, and three-dimensional MRCP images were rendered by post processing. COMPARISON:  Chest CT 09/25/2018 and abdominal CT scan 09/24/2018. Ultrasound examination 09/25/2018 FINDINGS: Examination is quite limited due to respiratory motion. Lower chest: The lung bases are grossly clear. Moderate eventration of the right hemidiaphragm. Mild cardiac enlargement but no pericardial effusion. Hepatobiliary: No obvious focal hepatic lesions. There is mild intrahepatic biliary dilatation and mild common bile duct dilatation, measuring up to 10 mm. The gallbladder is  moderately distended with maximum transverse diameter of 6.1 cm. A few small layering gallstones are also noted. No gallbladder wall thickening or pericholecystic inflammatory changes or fluid to suggest acute cholecystitis. There is an 8 x 7 mm distal common bile duct stone. Pancreas:  No obvious mass, inflammation or ductal dilatation. Spleen:  Borderline splenomegaly.  No focal lesions. Adrenals/Urinary Tract: The adrenal glands and kidneys are grossly normal. Stomach/Bowel: Visualized portions within the abdomen are unremarkable. Vascular/Lymphatic: No pathologically enlarged lymph nodes identified. No abdominal aortic aneurysm demonstrated. Other:  No ascites or abdominal wall hernia. Musculoskeletal: No significant bony findings. Lower lumbar scoliosis noted. IMPRESSION: 1. 8 x 7 mm distal common bile duct stone. 2. Moderate gallbladder distension and mild common bile duct dilatation (10 mm). Small gallstones noted the gallbladder. 3. Limited examination but no gross abnormality involving the liver, pancreas or spleen. Mild splenomegaly. Electronically Signed   By: Marijo Sanes M.D.   On: 09/25/2018 13:17   Ct Angio Chest/abd/pel For Dissection W And/or W/wo  Result Date: 09/25/2018 CLINICAL DATA:  Chest pain with acute aortic syndrome suspected.  Patient already received contrast tonight but has normal renal function, is not diabetic, and is to be hydrated. A reduced dose was used. EXAM: CT ANGIOGRAPHY CHEST, ABDOMEN AND PELVIS TECHNIQUE: Multidetector CT imaging through the chest, abdomen and pelvis was performed using the standard protocol during bolus administration of intravenous contrast. Multiplanar reconstructed images and MIPs were obtained and reviewed to evaluate the vascular anatomy. CONTRAST:  62mL OMNIPAQUE IOHEXOL 300 MG/ML  SOLN COMPARISON:  Abdomen and pelvis CT from earlier today. FINDINGS: CTA CHEST FINDINGS Cardiovascular: Noncontrast phase shows no intramural hematoma of the aorta. No  cardiomegaly or pericardial effusion. No aortic dissection inflammatory wall thickening on postcontrast imaging. Partial coverage of the right proximal subclavian shows likely high-grade stenosis. There is limited opacification of the pulmonary arteries with no central filling defect noted. Mediastinum/Nodes: Negative for adenopathy.  Large hiatal hernia. Lungs/Pleura: Central airways are clear. There is no edema, consolidation, effusion, or pneumothorax. Musculoskeletal: No acute or aggressive finding. Spinal degeneration and dextroscoliosis. Review of the MIP images confirms the above findings. CTA ABDOMEN AND PELVIS FINDINGS VASCULAR Aorta: Atheromatous plaque.  No aneurysm or dissection. Celiac: Patent without evidence of aneurysm, dissection, vasculitis or significant stenosis. SMA: Minimal atheromatous changes. No branch occlusion, beading, or flow limiting stenosis. Renals: Mild calcified plaque at the renal artery ostia. No stenosis or aneurysm seen. IMA: Patent Inflow: Atherosclerosis Veins: Negative Review of the MIP images confirms the above findings. NON-VASCULAR Hepatobiliary: Mild altered perfusion about the gallbladder fossa better evaluated on prior.Full gallbladder which was evaluated by right upper quadrant ultrasound earlier. There is a 10 mm high-density at the distal common bile duct. The common bile duct measures 1 cm in diameter. Pancreas: Prominent fatty atrophy. Spleen: Prominent craniocaudal span but no thickening. Adrenals/Urinary Tract: Negative adrenals. No hydronephrosis or stone. Contrast in the bladder from prior study showing mild trabeculation. Stomach/Bowel: Formed stool throughout the tortuous colon. No obstruction or bowel inflammation is seen. Hiatal hernia as noted above. Lymphatic: No mass or adenopathy. Reproductive:Enlarged prostate Other: No ascites or pneumoperitoneum. Musculoskeletal: Advanced spinal degeneration with dextroscoliosis. There is remarkably severe L3-4 disc  degeneration. Review of the MIP images confirms the above findings. IMPRESSION: 1. Probable 1 cm distal CBD stone 2. No findings of acute aortic syndrome. 3. 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 4. Generalized stool retention. Electronically Signed   By: Monte Fantasia M.D.   On: 09/25/2018 04:43   US Abdomen Limited Ruq  Result Date: 09/25/2018 CLINICAL DATA:  Initial evaluation for acute right upper quadrant abdominal pain. EXAM: ULTRASOUND ABDOMEN LIMITED RIGHT UPPER QUADRANT COMPARISON:  Prior CT from 09/24/2018 FINDINGS: Gallbladder: Internal sludge seen within the gallbladder without frank shadowing echogenic stones. Gallbladder wall mildly thickened up to 5 mm. No free pericholecystic fluid. No sonographic Murphy sign indicated by sonographer. Common bile duct: Diameter: 8 mm Liver: No focal lesion identified. Within normal limits in parenchymal echogenicity. Portal vein is patent on color Doppler imaging with normal direction of blood flow towards the liver. IMPRESSION: 1. Gallbladder sludge with mild gallbladder wall thickening. No cholelithiasis or additional sonographic features to suggest acute cholecystitis. 2. No biliary dilatation. Electronically Signed   By: Jeannine Boga M.D.   On: 09/25/2018 01:12     Assessment and Plan: Urethral stricture with foley trauma.   The foley is draining well.  Please leave indwelling for a week.  I will sign off.       LOS: 1 day    Irine Seal  09/26/2018 [phone] ID: Riley Eaton, male   DOB: 12-24-1940, 78 y.o.   MRN: [id number]

## 2018-09-26 NOTE — Progress Notes (Signed)
Patient ID: Riley Eaton, male   DOB: 1940-09-19, 78 y.o.   MRN: [id number]  Pt with some confusion this AM that appears to be new. D/w anesthesia that he has had some elev BPs overnight and with a h/o aortic aneurysm.  Pt has had his CBD cleared and had a sphincterotomy.  Pt with benign abdomen at this time.  Will await to see if his confusion will resolve and will d/w IM team to see what can be used to tx htn. Will postpone surgery today.  I d/w the pt's wife and son in the pre op area.

## 2018-09-26 NOTE — Progress Notes (Signed)
Messaged MD per persistent high SBP 158/109, 150/108, ST 108-117, pt remains restless despit Haldol .  MD recommended pain med

## 2018-09-27 ENCOUNTER — Encounter (HOSPITAL_COMMUNITY): Payer: Self-pay | Admitting: Certified Registered"

## 2018-09-27 ENCOUNTER — Encounter (HOSPITAL_COMMUNITY): Payer: Self-pay | Admitting: Pulmonary Disease

## 2018-09-27 ENCOUNTER — Encounter (HOSPITAL_COMMUNITY)
Admission: EM | Disposition: A | Discharge status: Skilled Nursing Facility | Payer: Self-pay | Source: Home / Self Care | Attending: Internal Medicine

## 2018-09-27 DIAGNOSIS — R4182 Altered mental status, unspecified: Secondary | ICD-10-CM

## 2018-09-27 LAB — COMPREHENSIVE METABOLIC PANEL
ALT: 65 U/L — ABNORMAL HIGH (ref 0–44)
AST: 104 U/L — ABNORMAL HIGH (ref 15–41)
Albumin: 3.8 g/dL (ref 3.5–5.0)
Alkaline Phosphatase: 113 U/L (ref 38–126)
Anion gap: 12 (ref 5–15)
BUN: 30 mg/dL — ABNORMAL HIGH (ref 8–23)
CO2: 18 mmol/L — ABNORMAL LOW (ref 22–32)
Calcium: 9.2 mg/dL (ref 8.9–10.3)
Chloride: 115 mmol/L — ABNORMAL HIGH (ref 98–111)
Creatinine, Ser: 0.77 mg/dL (ref 0.61–1.24)
GFR calc Af Amer: >60 mL/min (ref >60)
GFR calc non Af Amer: >60 mL/min (ref >60)
Glucose, Bld: 133 mg/dL — ABNORMAL HIGH (ref 70–99)
Potassium: 3 mmol/L — ABNORMAL LOW (ref 3.5–5.1)
Sodium: 145 mmol/L (ref 135–145)
Total Bilirubin: 2.2 mg/dL — ABNORMAL HIGH (ref 0.3–1.2)
Total Protein: 6.9 g/dL (ref 6.5–8.1)

## 2018-09-27 LAB — CBC
HCT: 37.1 % — ABNORMAL LOW (ref 39.0–52.0)
Hemoglobin: 13.2 g/dL (ref 13.0–17.0)
MCH: 31.4 pg (ref 26.0–34.0)
MCHC: 35.6 g/dL (ref 30.0–36.0)
MCV: 88.1 fL (ref 80.0–100.0)
Platelets: 124 10*3/uL — ABNORMAL LOW (ref 150–400)
RBC: 4.21 MIL/uL — ABNORMAL LOW (ref 4.22–5.81)
RDW: 13.4 % (ref 11.5–15.5)
WBC: 9.9 10*3/uL (ref 4.0–10.5)
nRBC: 0 % (ref 0.0–0.2)

## 2018-09-27 LAB — MAGNESIUM: Magnesium: 2 mg/dL (ref 1.7–2.4)

## 2018-09-27 LAB — LACTIC ACID, PLASMA
Lactic Acid, Venous: 1.1 mmol/L (ref 0.5–1.9)
Lactic Acid, Venous: 0.9 mmol/L (ref 0.5–1.9)

## 2018-09-27 LAB — KETONES, URINE: Ketones, ur: 80 mg/dL — AB

## 2018-09-27 LAB — PHOSPHORUS: Phosphorus: 3.2 mg/dL (ref 2.5–4.6)

## 2018-09-27 LAB — AMMONIA: Ammonia: 30 umol/L (ref 9–35)

## 2018-09-27 SURGERY — LAPAROSCOPIC CHOLECYSTECTOMY WITH INTRAOPERATIVE CHOLANGIOGRAM
Anesthesia: General

## 2018-09-27 MED ORDER — PHENYLEPHRINE 40 MCG/ML (10ML) SYRINGE FOR IV PUSH (FOR BLOOD PRESSURE SUPPORT)
PREFILLED_SYRINGE | INTRAVENOUS | Status: AC
  Filled 2018-09-27: qty 10

## 2018-09-27 MED ORDER — POTASSIUM CHLORIDE 10 MEQ/100ML IV SOLN
10.0000 meq | INTRAVENOUS | Status: AC
  Administered 2018-09-27 (×3): 10 meq via INTRAVENOUS
  Filled 2018-09-27 (×3): qty 100

## 2018-09-27 MED ORDER — FENTANYL 2500MCG IN NS 250ML (10MCG/ML) PREMIX INFUSION
0.0000 ug/h | INTRAVENOUS | Status: DC
  Administered 2018-09-27: 10 ug/h via INTRAVENOUS
  Administered 2018-09-29: 50 ug/h via INTRAVENOUS
  Filled 2018-09-27 (×2): qty 250

## 2018-09-27 MED ORDER — DEXTROSE-NACL 5-0.45 % IV SOLN
INTRAVENOUS | Status: AC
  Administered 2018-09-27: 925 mL via INTRAVENOUS
  Administered 2018-09-28: 14:00:00 via INTRAVENOUS

## 2018-09-27 MED ORDER — FENTANYL BOLUS VIA INFUSION: 25.0000 ug | INTRAVENOUS | Status: DC | PRN

## 2018-09-27 MED ORDER — METOPROLOL TARTRATE 5 MG/5ML IV SOLN
5.0000 mg | Freq: Once | INTRAVENOUS | Status: DC
  Filled 2018-09-27: qty 5

## 2018-09-27 MED ORDER — MAGNESIUM SULFATE IN D5W 1-5 GM/100ML-% IV SOLN
1.0000 g | Freq: Once | INTRAVENOUS | Status: AC
  Administered 2018-09-27: 1 g via INTRAVENOUS
  Filled 2018-09-27: qty 100

## 2018-09-27 MED ORDER — FENTANYL CITRATE (PF) 100 MCG/2ML IJ SOLN
25.0000 ug | INTRAMUSCULAR | Status: DC | PRN
  Administered 2018-09-28 (×5): 25 ug via INTRAVENOUS
  Filled 2018-09-27: qty 2

## 2018-09-27 MED ORDER — DEXMEDETOMIDINE HCL IN NACL 400 MCG/100ML IV SOLN
0.4000 ug/kg/h | INTRAVENOUS | Status: DC
  Administered 2018-09-27 – 2018-09-28 (×4): 1.2 ug/kg/h via INTRAVENOUS
  Administered 2018-09-28: 1 ug/kg/h via INTRAVENOUS
  Administered 2018-09-28 (×2): 1.2 ug/kg/h via INTRAVENOUS
  Administered 2018-09-29 (×2): 1 ug/kg/h via INTRAVENOUS
  Administered 2018-09-29: 0.6 ug/kg/h via INTRAVENOUS
  Administered 2018-09-30: 0.4 ug/kg/h via INTRAVENOUS
  Administered 2018-09-30: 0.3 ug/kg/h via INTRAVENOUS
  Filled 2018-09-27 (×13): qty 100

## 2018-09-27 MED ORDER — DEXMEDETOMIDINE HCL IN NACL 200 MCG/50ML IV SOLN
0.4000 ug/kg/h | INTRAVENOUS | Status: DC
  Administered 2018-09-27: 1.2 ug/kg/h via INTRAVENOUS
  Administered 2018-09-27: 1 ug/kg/h via INTRAVENOUS
  Administered 2018-09-27: 1.2 ug/kg/h via INTRAVENOUS
  Filled 2018-09-27 (×6): qty 50

## 2018-09-27 MED ORDER — LORAZEPAM 2 MG/ML IJ SOLN
1.0000 mg | Freq: Once | INTRAMUSCULAR | Status: AC
  Administered 2018-09-27: 1 mg via INTRAVENOUS
  Filled 2018-09-27: qty 1

## 2018-09-27 MED ORDER — IOPAMIDOL (ISOVUE-300) INJECTION 61%
INTRAVENOUS | Status: AC
  Filled 2018-09-27: qty 50

## 2018-09-27 MED ORDER — FENTANYL CITRATE (PF) 100 MCG/2ML IJ SOLN
50.0000 ug | Freq: Once | INTRAMUSCULAR | Status: AC
  Administered 2018-09-27: 50 ug via INTRAVENOUS

## 2018-09-27 MED ORDER — METOPROLOL TARTRATE 25 MG PO TABS: 25.0000 mg | ORAL_TABLET | Freq: Two times a day (BID) | ORAL | Status: DC

## 2018-09-27 MED ORDER — FENTANYL CITRATE (PF) 250 MCG/5ML IJ SOLN
INTRAMUSCULAR | Status: AC
  Filled 2018-09-27: qty 5

## 2018-09-27 MED ORDER — PROPOFOL 10 MG/ML IV BOLUS
INTRAVENOUS | Status: AC
  Filled 2018-09-27: qty 20

## 2018-09-27 NOTE — Progress Notes (Signed)
Evaluated patient quickly for delirium and significant agitation while primary team enroute Reviewed chart and discussed with family  Patient on a significant amount of narcotic pain meds at home from chronic back pain/disc disease Became tachycardic and hypertensive this morning Had questionable h/o AAA but CT negative for pathology on this admission Acute cholelithiasis s/p ERCP.  Enlarged prostate on CT. Has foley in place On Precedex  Exam: Vitals noted Agitated, ill, non-combative Airway patent Pupils reactive, no nystagmus Lungs clear Mucous membranes dry Abdomen: tender RUQ but not distended Skin: warm, dry Neurologic: agitated, GCS: 3-2-5. No seizure activity. Moving all extremities. GU: Foley in place  Labs reviewed  A/P: 1. Acute delirium-suspect secondary to narcotic withdrawal 2. Metabolic encephalopathy-multifactorial 3. Hypertension-secondary to above 4. Dehydration  Will check ECG for Qtc. Unable to swallow at this time so may require Geodon Control pain with fentanyl. Hold morphine to prevent biliary spasm and prolonged metabolite events IV fluids NPO Check ammonia Consider change to meropenem Keep foley to reduce chance of urinary retention in the face of delirium. Medications may produce reduced ability to urinate Keep in ICU   Irene Pap, DO FCCP  Total CC time: 30 min

## 2018-09-27 NOTE — Consult Note (Signed)
NAME:  Riley Eaton, MRN:  [id number], DOB:  01-20-1941, LOS: 2 ADMISSION DATE:  09/24/2018, CONSULTATION DATE:  09/27/2018 REFERRING MD:  Candiss Norse, CHIEF COMPLAINT:  Confusion.   HPI/course in hospital  78 year old man who is being admitted to the ICU for severe delirium requiring Precedex.    He has been confused since admission on 2/13 with ascending cholangitis from obstructing CBD stone removed by ERCP.  Despite resolution of cholangitis, patient remains delirious despite BZD and haloperidol. Wife reports no drug use apart form high dose narcotics for back pain.  Past Medical History   Past Medical History:  Diagnosis Date  . Chronic back pain   . 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 Chronic, continuous use of opioids    for back pain  . Coronary artery calcification seen on CT scan   . Nerve pain   . Thoracic aortic aneurysm [facility])      Past Surgical History:  Procedure Laterality Date  . BACK SURGERY     09-2016  . back surgey       Review of Systems:   Review of Systems  Unable to perform ROS: Mental status change    Social History   reports that he has never smoked. He has never used smokeless tobacco. He reports that he does not drink alcohol or use drugs.   Family History   His family history includes Cancer in his mother; Heart attack in his father.   Allergies No Known Allergies   Home Medications  Prior to Admission medications   Medication Sig Start Date End Date Taking? Authorizing Provider  allopurinol (ZYLOPRIM) 100 MG tablet TAKE 1 TABLET BY MOUTH EVERY DAY Patient taking differently: Take 100 mg by mouth daily.  09/15/18  Yes Kathrynn Ducking, MD  bisacodyl (DULCOLAX) 10 MG suppository Place 1 suppository (10 mg total) rectally as needed for moderate constipation. 10/26/17  Yes Varney Biles, MD  calcium-vitamin D (OSCAL WITH D) 500-200 MG-UNIT tablet Take 1 tablet by mouth daily with breakfast.   Yes [provider]  Diphenhydramine-APAP,  sleep, (TYLENOL PM EXTRA STRENGTH PO) Take 1 tablet by mouth at bedtime.    Yes [provider]  DULoxetine (CYMBALTA) 60 MG capsule TAKE 1 CAPSULE BY MOUTH TWICE A DAY Patient taking differently: Take 60 mg by mouth 2 (two) times daily.  05/05/18  Yes Kathrynn Ducking, MD  HYDROcodone-acetaminophen (NORCO/VICODIN) 5-325 MG tablet Take 1 tablet by mouth every 6 (six) hours as needed for moderate pain. Must last 28 days. 09/21/18  Yes Kathrynn Ducking, MD  oxyCODONE (OXYCONTIN) 80 mg 12 hr tablet Take 1 tablet (80 mg total) by mouth every 12 (twelve) hours. 09/21/18  Yes Kathrynn Ducking, MD  pregabalin (LYRICA) 200 MG capsule TAKE 1 CAPSULE BY MOUTH THREE TIMES A DAY Patient taking differently: Take 200 mg by mouth 3 (three) times daily.  09/23/18  Yes Kathrynn Ducking, MD  promethazine (PHENERGAN) 25 MG tablet Take 1 tablet by mouth every 4 (four) hours as needed for nausea or vomiting.  08/02/15  Yes [provider]  tamsulosin (FLOMAX) 0.4 MG CAPS capsule Take 0.4 mg by mouth daily.  08/29/15  Yes [provider]  traZODone (DESYREL) 50 MG tablet Take 25 mg by mouth at bedtime.    Yes [provider]  triamcinolone ointment (KENALOG) 0.1 % Apply 1 application topically 2 (two) times daily as needed for rash. 10/15/17  Yes [provider]  doxycycline (  VIBRAMYCIN) 100 MG capsule Take 1 capsule (100 mg total) by mouth 2 (two) times daily. Patient not taking: Reported on 09/24/2018 10/26/17   Varney Biles, MD     Interim history/subjective:  Patient moaning incomprehensibly.  Spoke with [facility] who have deferred semi-elective cholecystectomy until patient's delirium improves.  Objective   Blood pressure (!) 160/99, pulse (!) 110, temperature 98.5 F (36.9 C), temperature source Axillary, resp. rate 19, height 5\' 7"  (1.702 m), weight 71.7 kg, SpO2 98 %.        Intake/Output Summary (Last 24 hours) at 09/27/2018 1059 Last data filed at 09/27/2018  0600 Gross per 24 hour  Intake 446.05 ml  Output 925 ml  Net -478.95 ml   Filed Weights   09/25/18 0500  Weight: 71.7 kg    Examination: Physical Exam  Constitutional: He appears distressed.  HENT:  Head: Normocephalic and atraumatic.  Eyes: Pupils are equal, round, and reactive to light. Conjunctivae and EOM are normal. No scleral icterus.  Neck: Neck supple. No JVD present.  Cardiovascular: Normal rate, regular rhythm, normal heart sounds and intact distal pulses.  Respiratory: Effort normal and breath sounds normal.  GI: Soft.  Musculoskeletal:        General: No edema.  Neurological:  Unresponsive. Moves all limbs semi-purposefully  Skin: He is diaphoretic.     Ancillary tests (personally reviewed)  CBC: Recent Labs  Lab 09/24/18 2050 09/25/18 0555 09/26/18 0456 09/27/18 0528  WBC 9.0 11.5* 11.1* 9.9  NEUTROABS  --  9.5*  --   --   HGB 13.5 13.6 13.4 13.2  HCT 38.2* 38.6* 36.9* 37.1*  MCV 89.5 89.8 88.7 88.1  PLT 175 142* 120* 124*    Basic Metabolic Panel: Recent Labs  Lab 09/24/18 2050 09/24/18 2102 09/25/18 0555 09/26/18 0456 09/27/18 0528  NA 138  --  141 140 145  K 4.0  --  3.4* 3.3* 3.0*  CL 104  --  109 107 115*  CO2 21*  --  17* 19* 18*  GLUCOSE 165*  --  131* 157* 133*  BUN 16  --  18 17 30*  CREATININE 0.80 0.60* 1.13 0.72 0.77  CALCIUM 9.4  --  9.6 9.3 9.2  MG  --   --  1.8 1.8 2.0   GFR: Estimated Creatinine Clearance: 70 mL/min (by C-G formula based on SCr of 0.77 mg/dL). Recent Labs  Lab 09/24/18 2050 09/25/18 0010 09/25/18 0555 09/26/18 0456 09/27/18 0528  WBC 9.0  --  11.5* 11.1* 9.9  LATICACIDVEN  --  1.4  --   --   --     Liver Function Tests: Recent Labs  Lab 09/24/18 2058 09/25/18 0555 09/26/18 0456 09/27/18 0528  AST 32 34 160* 104*  ALT 17 17 61* 65*  ALKPHOS 96 94 119 113  BILITOT 1.4* 1.8* 3.7* 2.2*  PROT 6.7 7.0 6.8 6.9  ALBUMIN 4.0 4.2 3.9 3.8   Recent Labs  Lab 09/24/18 2058 09/26/18 0456   LIPASE 23 46   No results for input(s): AMMONIA in the last 168 hours.  ABG No results found for: PHART, PCO2ART, PO2ART, HCO3, TCO2, ACIDBASEDEF, O2SAT   Coagulation Profile: No results for input(s): INR, PROTIME in the last 168 hours.  Cardiac Enzymes: Recent Labs  Lab 09/25/18 0555  TROPONINI 0.04*    HbA1C: No results found for: HGBA1C  CBG: No results for input(s): GLUCAP in the last 168 hours.   Assessment & Plan:   Critically ill due to severe  hyperactive delirium requiring titration of Precedex to prevent self harm.  No clear precipitant apart form acute illness and possible opiate withdrawal Acute ascending cholangitis - appears to be improving. Hypertension. BPH   PLAN:  Initiate Precedex infusion and titrate to behavior control. Initiate low dose narcotic infusion and titrate to symptom control Monitor for signs of respiratory depression. Insert Cortrak tube tomorrow for enteral access for nutrition and to provide enteral narcotics.  Best practice:  Diet: NPO Pain/Anxiety/Delirium protocol (if indicated): precedex and fentanyl VAP protocol (if indicated): n/a DVT prophylaxis: UFH GI prophylaxis: N/a Glucose control: monitor Mobility: bedrest - Foley catheter required due to BPH and need to follow urine output. Code Status: Full Family Communication: wife updated at bedside Disposition: ICU   Critical care time: 35 min including chart review, examination of the patient, multidisciplinary rounds, assessment of level of consciousness and adjustments in sedative infusions.    Kipp Brood, MD [facility] ICU Physician [place]  Pager: [phone] Mobile: [phone] After hours: [phone].  09/27/2018, 10:59 AM

## 2018-09-27 NOTE — Progress Notes (Signed)
Subjective Seen this morning in preop - he has been persistently tachycardic without abdominal pain. He is now in restraints and remains delirious.  Objective: Vital signs in last 24 hours: Temp:  [98 F (36.7 C)-98.4 F (36.9 C)] 98 F (36.7 C) (02/15 2333) Pulse Rate:  [105-119] 119 (02/15 2333) Resp:  [14-22] 22 (02/15 2050) BP: (137-177)/(78-122) 137/84 (02/15 2333) SpO2:  [97 %-98 %] 98 % (02/15 2333) Last BM Date: 09/26/18  Intake/Output from previous day: 02/15 0701 - 02/16 0700 In: 446.1 [I.V.:374.1; IV Piggyback:72] Out: 4696 [Urine:1525] Intake/Output this shift: No intake/output data recorded.  Gen: NAD, in wrist restraints. CV: Tachycardic rate, regular rhythm Pulm: Normal work of breathing Abd: Soft, NT/ND. No rebound. No guarding Ext: SCDs in place  Lab Results: CBC  Recent Labs    09/26/18 0456 09/27/18 0528  WBC 11.1* 9.9  HGB 13.4 13.2  HCT 36.9* 37.1*  PLT 120* 124*   BMET Recent Labs    09/26/18 0456 09/27/18 0528  NA 140 145  K 3.3* 3.0*  CL 107 115*  CO2 19* 18*  GLUCOSE 157* 133*  BUN 17 30*  CREATININE 0.72 0.77  CALCIUM 9.3 9.2   PT/INR No results for input(s): LABPROT, INR in the last 72 hours. ABG No results for input(s): PHART, HCO3 in the last 72 hours.  Invalid input(s): PCO2, PO2  Studies/Results:  Anti-infectives: Anti-infectives (From admission, onward)   Start     Dose/Rate Route Frequency Ordered Stop   09/25/18 1200  piperacillin-tazobactam (ZOSYN) IVPB 3.375 g     3.375 g 12.5 mL/hr over 240 Minutes Intravenous Every 8 hours 09/25/18 0502     09/25/18 0515  piperacillin-tazobactam (ZOSYN) IVPB 3.375 g     3.375 g 100 mL/hr over 30 Minutes Intravenous  Once 09/25/18 0501 09/25/18 0844       Assessment/Plan: Patient Active Problem List   Diagnosis Date Noted  . Nausea & vomiting 09/25/2018  . Choledocholithiasis 09/25/2018  . Chronic pain 09/25/2018  . Urinary retention 09/25/2018  . Constipation  09/25/2018  . Intractable vomiting   . 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 Thoracic aortic aneurysm ([place]) 10/03/2015  . Coronary artery calcification seen on CT scan 10/03/2015    -Will plan to delay procedure for time being until delirium resolves - per discussion with team yesterday, there was concern for substance withdrawal. He does not appear at this time to have cholangitis as his total bilirubin is down trending, normotensive/hypertensive -We will follow with you   LOS: 2 days   Sharon Mt. Dema Severin, M.D. [facility]

## 2018-09-27 NOTE — Anesthesia Postprocedure Evaluation (Signed)
Anesthesia Post Note  Patient: Riley Eaton  Procedure(s) Performed: ENDOSCOPIC RETROGRADE CHOLANGIOPANCREATOGRAPHY (ERCP) WITH PROPOFOL (N/A ) PANCREATIC STENT PLACEMENT REMOVAL OF STONES SPHINCTEROTOMY     Patient location during evaluation: PACU Anesthesia Type: General Level of consciousness: lethargic and patient uncooperative Pain management: pain level controlled Vital Signs Assessment: post-procedure vital signs reviewed and stable Respiratory status: spontaneous breathing, nonlabored ventilation, respiratory function stable and patient connected to nasal cannula oxygen Cardiovascular status: blood pressure returned to baseline and stable Postop Assessment: no apparent nausea or vomiting Anesthetic complications: no    Last Vitals:  Vitals:   09/27/18 2200 09/27/18 2300  BP: (!) 152/88 129/71  Pulse: (!) 34 65  Resp: 15 17  Temp:  (!) 36.3 C  SpO2: 92% 100%    Last Pain:  Vitals:   09/27/18 2300  TempSrc: Axillary  PainSc:                  Zuleika Gallus COKER

## 2018-09-27 NOTE — Progress Notes (Signed)
PROGRESS NOTE                                                                                                                                                                                                             Patient Demographics:    Riley Eaton, is a 78 y.o. male, DOB - February 14, 1941, OVA:[id number]  Admit date - 09/24/2018   Admitting Physician Rise Patience, MD  Outpatient Primary MD for the patient is Aletha Halim., PA-C  LOS - 2  Chief Complaint  Patient presents with  . Chest Pain       Brief Narrative  Riley Eaton is a 78 y.o. male with history of chronic low back pain, BPH was brought to the ER after patient started complaining of chest pain.  Patient's chest pain started last evening around 7 PM.  Prior to which patient had nausea vomiting, further work-up suggested that patient had more right upper quadrant pain and chest pain, he was diagnosed with a CBD stone along with bladder outlet obstruction.  GI, General surgery and urology were consulted and he was admitted to our service for further care.   Subjective:    Riley Eaton today is confused but denies headache or abd pain, no SOB, +ve chronic back pain.   Assessment  & Plan :     1.  CBD stone with obstruction and cholecystitis.  Currently on gentle IV fluids and IV Zosyn, seen by GI and underwent ERCP on 09/25/2018 by Dr. Megan Salon with pancreatic stent placement, sphincterotomy and balloon extraction.  He will require cholecystectomy defer this to general surgery.  He was due for surgery on 09/26/2018 and again on 09/27/2018 early morning but this was postponed due to high blood pressure agitation.  We will continue n.p.o. except meds.  2.  BPH with urinary outlet obstruction.  Urology was consulted underwent Foley placement on 09/25/2018, continue Flomax.  3. 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  Currently in mild withdrawal, home narcotics resumed at lower dose to  prevent withdrawal.  4. Severe Delirium and Agitation.  This is hospital-acquired encephalopathy worsened by infection and some element of narcotic withdrawal.  Home narcotics have been resumed, he is on moderate dose of Seroquel and Haldol as needed, still quite encephalopathic and confused on 09/27/2018, I think he will require Precedex for short-term, will move to ICU for the same, have informed general surgery at 9 AM today personally to Dr. Dema Severin that by 12 noon  on 09/27/2018 his mentation should improve after a few hours of Precedex.  Wife denies any alcohol use.  5.  Hypertension.  Worse due to delirium, placed on low-dose Coreg and Norvasc along with PRN IV hydralazine and IV Lopressor.  6.  Hypokalemia.  Replaced IV will monitor.    Family Communication  :  Wife in detail  Code Status :  Full  Disposition Plan  : ICU for Precedex drip  Consults  :  GI, CCS, Urology, urinary and critical care  Procedures  :    ERCP - Pancreatic stent placement, SPHINCTEROTOMY, balloon extraction.  CT - 1. Probable 1 cm distal CBD stone 2. No findings of acute aortic syndrome. 3. 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 4. Generalized stool retention.  RUQ [place] - 1. Gallbladder sludge with mild gallbladder wall thickening. No cholelithiasis or additional sonographic features to suggest acute cholecystitis. 2. No biliary dilatation.  MRCP - 1. 8 x 7 mm distal common bile duct stone. 2. Moderate gallbladder distension and mild common bile duct dilatation (10 mm). Small gallstones noted the gallbladder. 3. Limited examination but no gross abnormality involving the liver, pancreas or spleen. Mild splenomegaly.     DVT Prophylaxis  :    Heparin   Lab Results  Component Value Date   PLT 124 (L) 09/27/2018    Diet :  Diet Order            Diet NPO time specified Except for: Sips with Meds  Diet effective now               Inpatient  Medications Scheduled Meds: . amLODipine  10 mg Oral Daily  . bisacodyl  10 mg Rectal BID  . carvedilol  3.125 mg Oral BID WC  . heparin injection (subcutaneous)  5,000 Units Subcutaneous Q8H  . metoprolol tartrate  5 mg Intravenous Once  . oxyCODONE  40 mg Oral Q12H  . polyethylene glycol  17 g Oral TID  . QUEtiapine  50 mg Oral BID  . senna  2 tablet Oral Daily  . tamsulosin  0.4 mg Oral Daily   Continuous Infusions: . dexmedetomidine (PRECEDEX) IV infusion 1 mcg/kg/hr (09/27/18 0901)  . dextrose 5 % and 0.45% NaCl    . piperacillin-tazobactam (ZOSYN)  IV 3.375 g (09/27/18 0424)  . potassium chloride     PRN Meds:.acetaminophen **OR** [DISCONTINUED] acetaminophen, haloperidol lactate, hydrALAZINE, morphine injection, [DISCONTINUED] ondansetron **OR** ondansetron (ZOFRAN) IV  Antibiotics  :   Anti-infectives (From admission, onward)   Start     Dose/Rate Route Frequency Ordered Stop   09/25/18 1200  piperacillin-tazobactam (ZOSYN) IVPB 3.375 g     3.375 g 12.5 mL/hr over 240 Minutes Intravenous Every 8 hours 09/25/18 0502     09/25/18 0515  piperacillin-tazobactam (ZOSYN) IVPB 3.375 g     3.375 g 100 mL/hr over 30 Minutes Intravenous  Once 09/25/18 0501 09/25/18 0844          Objective:   Vitals:   09/26/18 2050 09/26/18 2058 09/26/18 2333 09/27/18 0800  BP: (!) 158/109 (!) 150/108 137/84 (!) 154/74  Pulse: (!) 109 (!) 117 (!) 119   Resp: (!) 22     Temp: 98.3 F (36.8 C)  98 F (36.7 C)   TempSrc: Oral  Axillary   SpO2: 97%  98%   Weight:      Height:        Wt Readings from Last 3 Encounters:  09/25/18  71.7 kg  07/06/18 71.7 kg  12/31/17 72.3 kg     Intake/Output Summary (Last 24 hours) at 09/27/2018 0908 Last data filed at 09/27/2018 0600 Gross per 24 hour  Intake 446.05 ml  Output 925 ml  Net -478.95 ml     Physical Exam  Awake - confused, foley, No new F.N deficits, Normal affect Vance.AT,PERRAL Supple Neck,No JVD, No cervical lymphadenopathy  appriciated.  Symmetrical Chest wall movement, Good air movement bilaterally, CTAB RRR,No Gallops,Rubs or new Murmurs, No Parasternal Heave +ve B.Sounds, Abd Soft, No tenderness, No organomegaly appriciated, No rebound - guarding or rigidity. No Cyanosis, Clubbing or edema, No new Rash or bruise      Data Review:    CBC Recent Labs  Lab 09/24/18 2050 09/25/18 0555 09/26/18 0456 09/27/18 0528  WBC 9.0 11.5* 11.1* 9.9  HGB 13.5 13.6 13.4 13.2  HCT 38.2* 38.6* 36.9* 37.1*  PLT 175 142* 120* 124*  MCV 89.5 89.8 88.7 88.1  MCH 31.6 31.6 32.2 31.4  MCHC 35.3 35.2 36.3* 35.6  RDW 13.0 13.2 13.3 13.4  LYMPHSABS  --  0.9  --   --   MONOABS  --  0.9  --   --   EOSABS  --  0.0  --   --   BASOSABS  --  0.0  --   --     Chemistries  Recent Labs  Lab 09/24/18 2050 09/24/18 2058 09/24/18 2102 09/25/18 0555 09/26/18 0456 09/27/18 0528  NA 138  --   --  141 140 145  K 4.0  --   --  3.4* 3.3* 3.0*  CL 104  --   --  109 107 115*  CO2 21*  --   --  17* 19* 18*  GLUCOSE 165*  --   --  131* 157* 133*  BUN 16  --   --  18 17 30*  CREATININE 0.80  --  0.60* 1.13 0.72 0.77  CALCIUM 9.4  --   --  9.6 9.3 9.2  MG  --   --   --  1.8 1.8 2.0  AST  --  32  --  34 160* 104*  ALT  --  17  --  17 61* 65*  ALKPHOS  --  96  --  94 119 113  BILITOT  --  1.4*  --  1.8* 3.7* 2.2*   ------------------------------------------------------------------------------------------------------------------ No results for input(s): CHOL, HDL, LDLCALC, TRIG, CHOLHDL, LDLDIRECT in the last 72 hours.  No results found for: HGBA1C ------------------------------------------------------------------------------------------------------------------ No results for input(s): TSH, T4TOTAL, T3FREE, THYROIDAB in the last 72 hours.  Invalid input(s): FREET3 ------------------------------------------------------------------------------------------------------------------ No results for input(s): VITAMINB12, FOLATE,  FERRITIN, TIBC, IRON, RETICCTPCT in the last 72 hours.  Coagulation profile No results for input(s): INR, PROTIME in the last 168 hours.  Recent Labs    09/24/18 2100  DDIMER 0.48    Cardiac Enzymes Recent Labs  Lab 09/25/18 0555  TROPONINI 0.04*   ------------------------------------------------------------------------------------------------------------------    Component Value Date/Time   BNP 24.7 08/23/2015 1326    Micro Results Recent Results (from the past 240 hour(s))  Surgical pcr screen     Status: None   Collection Time: 09/26/18  2:11 AM  Result Value Ref Range Status   MRSA, PCR NEGATIVE NEGATIVE Final   Staphylococcus aureus NEGATIVE NEGATIVE Final    Comment: (NOTE) The Xpert SA Assay (FDA approved for NASAL specimens in patients 42 years of age and older), is one component of a comprehensive  surveillance program. It is not intended to diagnose infection nor to guide or monitor treatment. Performed at [facility], [place] [address] [place], [place] [address]     Radiology Reports Dg Chest 2 View  Result Date: 09/24/2018 CLINICAL DATA:  Chest pain.  Nausea, vomiting. EXAM: CHEST - 2 VIEW COMPARISON:  10/26/2017 FINDINGS: Large hiatal hernia. Heart is borderline in size. No confluent airspace opacity, effusion or edema. No acute bony abnormality. IMPRESSION: Large hiatal hernia.  Borderline heart size.  No active disease. Electronically Signed   By: Rolm Baptise M.D.   On: 09/24/2018 21:17   Ct Abdomen Pelvis W Contrast  Result Date: 09/24/2018 CLINICAL DATA:  Abdominal pain EXAM: CT ABDOMEN AND PELVIS WITH CONTRAST TECHNIQUE: Multidetector CT imaging of the abdomen and pelvis was performed using the standard protocol following bolus administration of intravenous contrast. CONTRAST:  166mL OMNIPAQUE 300 COMPARISON:  None. FINDINGS: Lower chest: Lung bases are free of acute infiltrate or sizable effusion. Coronary calcifications are identified. Moderate  sized hiatal hernia is noted. Hepatobiliary: Gallbladder is well distended. The liver is within normal limits. No definitive common bile duct stone is seen. Pancreas: The pancreas is predominately atrophic. Spleen: Spleen is within normal limits. Adrenals/Urinary Tract: Adrenal glands are unremarkable. Kidneys demonstrate a normal enhancement pattern bilaterally. Normal excretion of contrast is seen. Bladder is well distended. Stomach/Bowel: Fecal material is noted throughout the colon consistent with mild constipation. The appendix is within normal limits. No small bowel inflammatory changes are seen. The stomach demonstrates a hiatal hernia. Vascular/Lymphatic: Aortic atherosclerosis. No enlarged abdominal or pelvic lymph nodes. Reproductive: Prostate is unremarkable. Other: No abdominal wall hernia or abnormality. No abdominopelvic ascites. Musculoskeletal: Degenerative changes of the lumbar spine are noted. No acute compression deformity is seen. Scoliosis concave to the left is noted in the mid lumbar spine. IMPRESSION: Changes of constipation.  No other focal abnormality is noted Electronically Signed   By: Inez Catalina M.D.   On: 09/24/2018 22:48   Mr Abdomen Mrcp Wo Contrast  Result Date: 09/25/2018 CLINICAL DATA:  Evaluate for distal common bile duct stone. EXAM: MRI ABDOMEN WITHOUT CONTRAST  (INCLUDING MRCP) TECHNIQUE: Multiplanar multisequence MR imaging of the abdomen was performed. Heavily T2-weighted images of the biliary and pancreatic ducts were obtained, and three-dimensional MRCP images were rendered by post processing. COMPARISON:  Chest CT 09/25/2018 and abdominal CT scan 09/24/2018. Ultrasound examination 09/25/2018 FINDINGS: Examination is quite limited due to respiratory motion. Lower chest: The lung bases are grossly clear. Moderate eventration of the right hemidiaphragm. Mild cardiac enlargement but no pericardial effusion. Hepatobiliary: No obvious focal hepatic lesions. There is mild  intrahepatic biliary dilatation and mild common bile duct dilatation, measuring up to 10 mm. The gallbladder is moderately distended with maximum transverse diameter of 6.1 cm. A few small layering gallstones are also noted. No gallbladder wall thickening or pericholecystic inflammatory changes or fluid to suggest acute cholecystitis. There is an 8 x 7 mm distal common bile duct stone. Pancreas:  No obvious mass, inflammation or ductal dilatation. Spleen:  Borderline splenomegaly.  No focal lesions. Adrenals/Urinary Tract: The adrenal glands and kidneys are grossly normal. Stomach/Bowel: Visualized portions within the abdomen are unremarkable. Vascular/Lymphatic: No pathologically enlarged lymph nodes identified. No abdominal aortic aneurysm demonstrated. Other:  No ascites or abdominal wall hernia. Musculoskeletal: No significant bony findings. Lower lumbar scoliosis noted. IMPRESSION: 1. 8 x 7 mm distal common bile duct stone. 2. Moderate gallbladder distension and mild common bile duct dilatation (10 mm).  Small gallstones noted the gallbladder. 3. Limited examination but no gross abnormality involving the liver, pancreas or spleen. Mild splenomegaly. Electronically Signed   By: Marijo Sanes M.D.   On: 09/25/2018 13:17   Dg Ercp Biliary & Pancreatic Ducts  Result Date: 09/26/2018 CLINICAL DATA:  Choledocholithiasis. EXAM: ERCP TECHNIQUE: Multiple spot images obtained with the fluoroscopic device and submitted for interpretation post-procedure. COMPARISON:  MRCP-earlier same day FLUOROSCOPY TIME:  5 minutes, 10 seconds (48 mGy) FINDINGS: Twelve spot intraoperative fluoroscopic images of the right upper abdominal quadrant during ERCP are provided for review. Initial image demonstrates an ERCP probe overlying the right upper abdominal quadrant with potential cannulation of the pancreatic duct. Subsequent images demonstrate opacification of the common bile duct. There is an apparent nonocclusive filling defect  within distal aspect of the CBD (image 11), not definitely seen on completion image (image 12). IMPRESSION: ERCP with findings suggestive of nonocclusive choledocholithiasis with subsequent presumed stone extraction. These images were submitted for radiologic interpretation only. Please see the procedural report for the amount of contrast and the fluoroscopy time utilized. Electronically Signed   By: Sandi Mariscal M.D.   On: 09/26/2018 08:32   Ct Angio Chest/abd/pel For Dissection W And/or W/wo  Result Date: 09/25/2018 CLINICAL DATA:  Chest pain with acute aortic syndrome suspected. Patient already received contrast tonight but has normal renal function, is not diabetic, and is to be hydrated. A reduced dose was used. EXAM: CT ANGIOGRAPHY CHEST, ABDOMEN AND PELVIS TECHNIQUE: Multidetector CT imaging through the chest, abdomen and pelvis was performed using the standard protocol during bolus administration of intravenous contrast. Multiplanar reconstructed images and MIPs were obtained and reviewed to evaluate the vascular anatomy. CONTRAST:  75mL OMNIPAQUE IOHEXOL 300 MG/ML  SOLN COMPARISON:  Abdomen and pelvis CT from earlier today. FINDINGS: CTA CHEST FINDINGS Cardiovascular: Noncontrast phase shows no intramural hematoma of the aorta. No cardiomegaly or pericardial effusion. No aortic dissection inflammatory wall thickening on postcontrast imaging. Partial coverage of the right proximal subclavian shows likely high-grade stenosis. There is limited opacification of the pulmonary arteries with no central filling defect noted. Mediastinum/Nodes: Negative for adenopathy.  Large hiatal hernia. Lungs/Pleura: Central airways are clear. There is no edema, consolidation, effusion, or pneumothorax. Musculoskeletal: No acute or aggressive finding. Spinal degeneration and dextroscoliosis. Review of the MIP images confirms the above findings. CTA ABDOMEN AND PELVIS FINDINGS VASCULAR Aorta: Atheromatous plaque.  No aneurysm  or dissection. Celiac: Patent without evidence of aneurysm, dissection, vasculitis or significant stenosis. SMA: Minimal atheromatous changes. No branch occlusion, beading, or flow limiting stenosis. Renals: Mild calcified plaque at the renal artery ostia. No stenosis or aneurysm seen. IMA: Patent Inflow: Atherosclerosis Veins: Negative Review of the MIP images confirms the above findings. NON-VASCULAR Hepatobiliary: Mild altered perfusion about the gallbladder fossa better evaluated on prior.Full gallbladder which was evaluated by right upper quadrant ultrasound earlier. There is a 10 mm high-density at the distal common bile duct. The common bile duct measures 1 cm in diameter. Pancreas: Prominent fatty atrophy. Spleen: Prominent craniocaudal span but no thickening. Adrenals/Urinary Tract: Negative adrenals. No hydronephrosis or stone. Contrast in the bladder from prior study showing mild trabeculation. Stomach/Bowel: Formed stool throughout the tortuous colon. No obstruction or bowel inflammation is seen. Hiatal hernia as noted above. Lymphatic: No mass or adenopathy. Reproductive:Enlarged prostate Other: No ascites or pneumoperitoneum. Musculoskeletal: Advanced spinal degeneration with dextroscoliosis. 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 Review of the MIP images confirms the above findings. IMPRESSION: 1. Probable 1 cm distal  CBD stone 2. No findings of acute aortic syndrome. 3. 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 4. Generalized stool retention. Electronically Signed   By: Monte Fantasia M.D.   On: 09/25/2018 04:43   US Abdomen Limited Ruq  Result Date: 09/25/2018 CLINICAL DATA:  Initial evaluation for acute right upper quadrant abdominal pain. EXAM: ULTRASOUND ABDOMEN LIMITED RIGHT UPPER QUADRANT COMPARISON:  Prior CT from 09/24/2018 FINDINGS: Gallbladder: Internal sludge seen within the gallbladder without frank shadowing echogenic  stones. Gallbladder wall mildly thickened up to 5 mm. No free pericholecystic fluid. No sonographic Murphy sign indicated by sonographer. Common bile duct: Diameter: 8 mm Liver: No focal lesion identified. Within normal limits in parenchymal echogenicity. Portal vein is patent on color Doppler imaging with normal direction of blood flow towards the liver. IMPRESSION: 1. Gallbladder sludge with mild gallbladder wall thickening. No cholelithiasis or additional sonographic features to suggest acute cholecystitis. 2. No biliary dilatation. Electronically Signed   By: Jeannine Boga M.D.   On: 09/25/2018 01:12    Time Spent in minutes  30   Lala Lund M.D on 09/27/2018 at 9:08 AM  To page go to [email or web address] - password [email or web address]

## 2018-09-28 ENCOUNTER — Inpatient Hospital Stay (HOSPITAL_COMMUNITY): Payer: Medicare Other

## 2018-09-28 DIAGNOSIS — F05 Delirium due to known physiological condition: Secondary | ICD-10-CM

## 2018-09-28 DIAGNOSIS — E43 Unspecified severe protein-calorie malnutrition: Secondary | ICD-10-CM

## 2018-09-28 LAB — COMPREHENSIVE METABOLIC PANEL
ALT: 67 U/L — ABNORMAL HIGH (ref 0–44)
AST: 91 U/L — ABNORMAL HIGH (ref 15–41)
Albumin: 3.6 g/dL (ref 3.5–5.0)
Alkaline Phosphatase: 95 U/L (ref 38–126)
Anion gap: 10 (ref 5–15)
BUN: 39 mg/dL — ABNORMAL HIGH (ref 8–23)
CO2: 18 mmol/L — ABNORMAL LOW (ref 22–32)
Calcium: 9.2 mg/dL (ref 8.9–10.3)
Chloride: 119 mmol/L — ABNORMAL HIGH (ref 98–111)
Creatinine, Ser: 0.73 mg/dL (ref 0.61–1.24)
GFR calc Af Amer: >60 mL/min (ref >60)
GFR calc non Af Amer: >60 mL/min (ref >60)
Glucose, Bld: 139 mg/dL — ABNORMAL HIGH (ref 70–99)
Potassium: 3.1 mmol/L — ABNORMAL LOW (ref 3.5–5.1)
Sodium: 147 mmol/L — ABNORMAL HIGH (ref 135–145)
Total Bilirubin: 1.9 mg/dL — ABNORMAL HIGH (ref 0.3–1.2)
Total Protein: 6.4 g/dL — ABNORMAL LOW (ref 6.5–8.1)

## 2018-09-28 LAB — PHOSPHORUS: Phosphorus: 3.5 mg/dL (ref 2.5–4.6)

## 2018-09-28 LAB — CBC
HCT: 35.4 % — ABNORMAL LOW (ref 39.0–52.0)
Hemoglobin: 12.4 g/dL — ABNORMAL LOW (ref 13.0–17.0)
MCH: 31.5 pg (ref 26.0–34.0)
MCHC: 35 g/dL (ref 30.0–36.0)
MCV: 89.8 fL (ref 80.0–100.0)
Platelets: 124 10*3/uL — ABNORMAL LOW (ref 150–400)
RBC: 3.94 MIL/uL — ABNORMAL LOW (ref 4.22–5.81)
RDW: 13.4 % (ref 11.5–15.5)
WBC: 7.8 10*3/uL (ref 4.0–10.5)
nRBC: 0 % (ref 0.0–0.2)

## 2018-09-28 LAB — MAGNESIUM: Magnesium: 2.3 mg/dL (ref 1.7–2.4)

## 2018-09-28 IMAGING — DX DG ABD PORTABLE 1V
1 series · 1 of 1 positions shown · non-contrast
Comparison: CT, [DATE]

CLINICAL DATA: Feeding tube placement.

EXAM:
PORTABLE ABDOMEN - 1 VIEW

[abdomen]
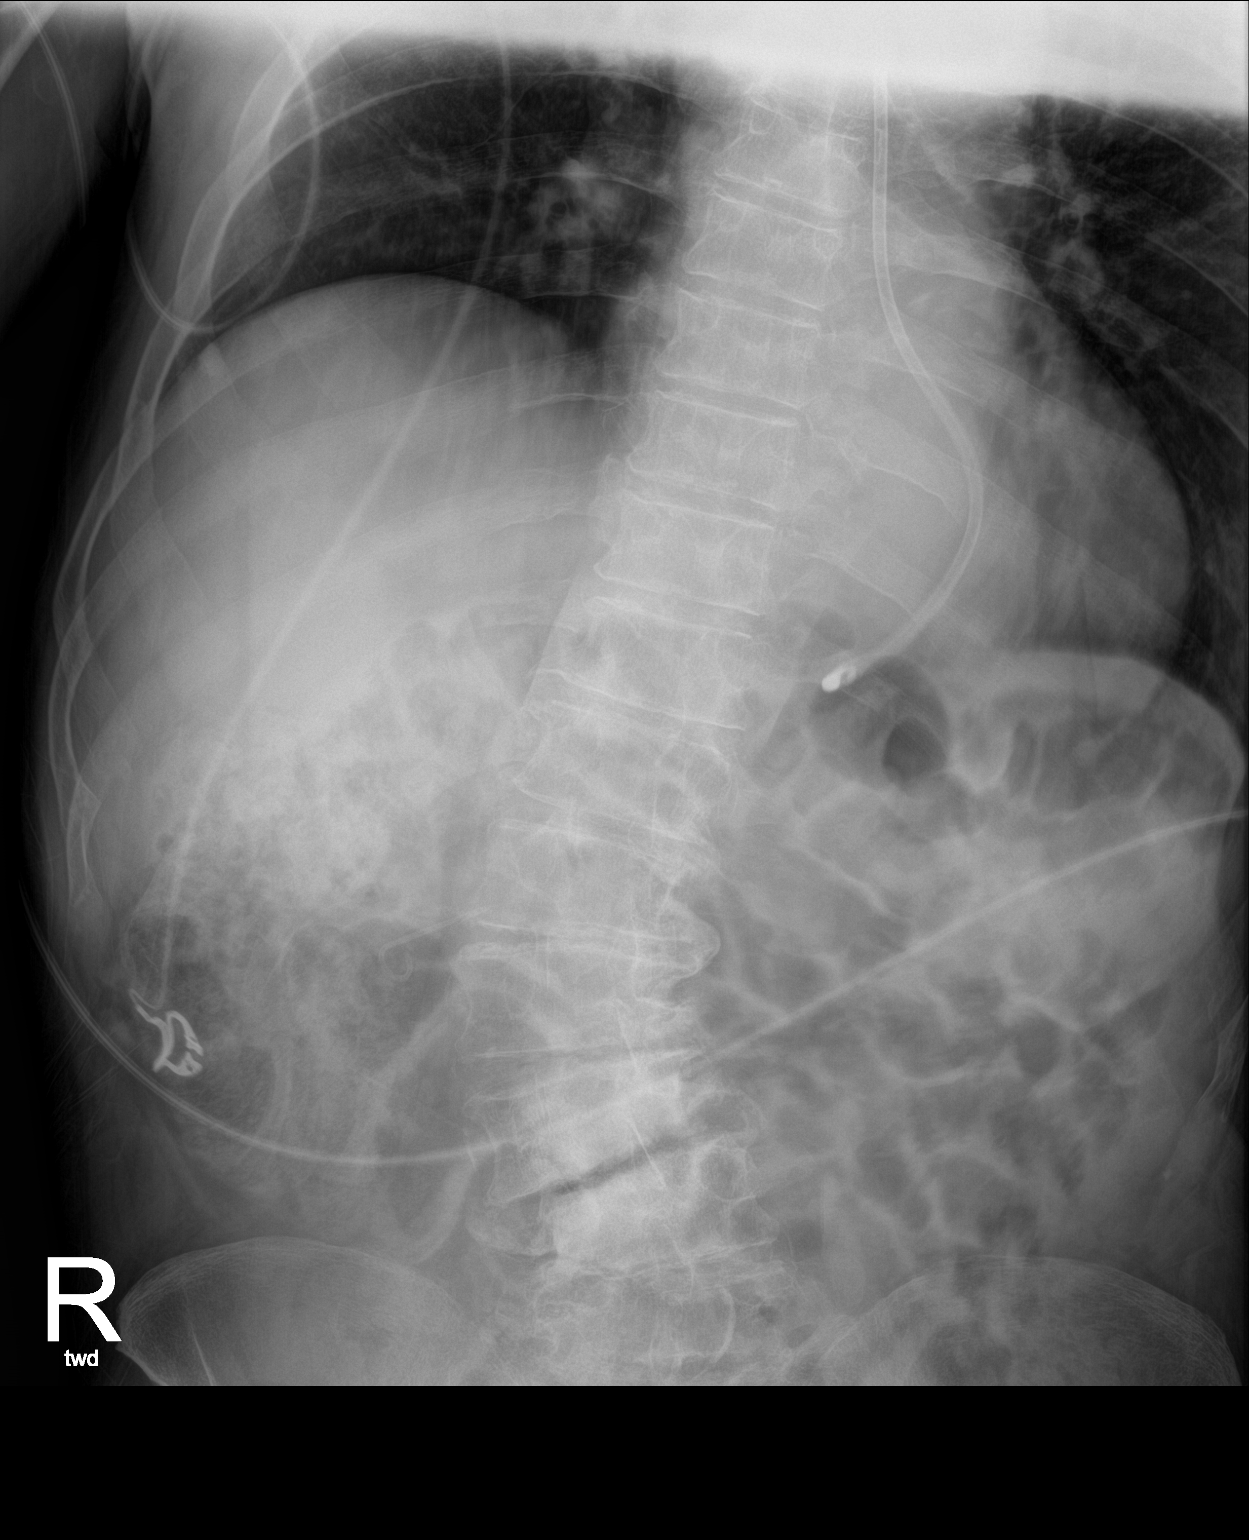

[1 of 1 positions shown; findings below may reference images not displayed]

FINDINGS: Enteric feeding tube tip projects within the hiatal hernia at the
level of the medial left hemidiaphragm. It does not extend below the
diaphragm.

Normal bowel gas pattern.
IMPRESSION: Enteric feeding tube tip projects within the hiatal hernia, tip just
above the medial left hemidiaphragm, not entering the stomach.

## 2018-09-28 IMAGING — RF DG ABDOMEN 1V
1 series · 2 of 2 positions shown · non-contrast
Comparison: None.

CLINICAL DATA: Feeding tube positioning

EXAM:
ABDOMEN - 1 VIEW

[Series 1: run · 2 of 2 slices shown]
[im 1/2]
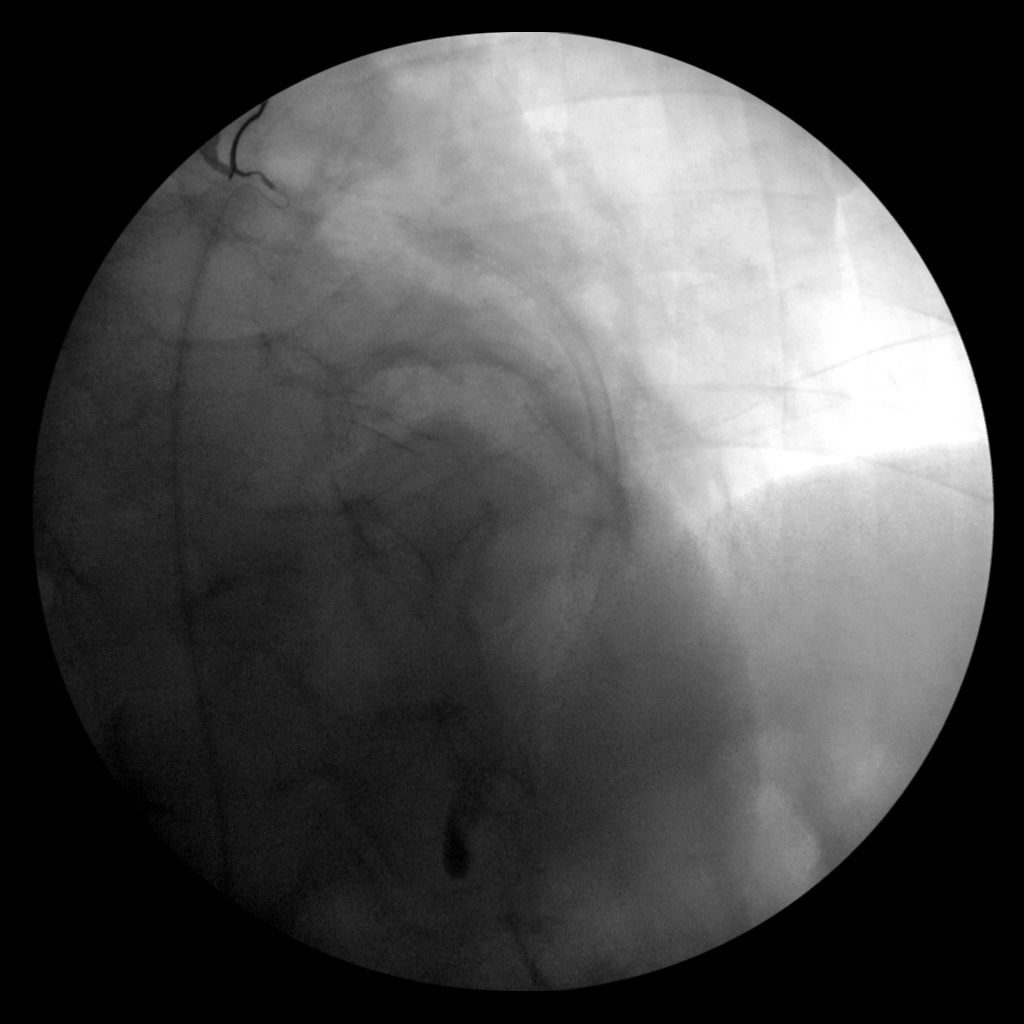
[im 2/2]
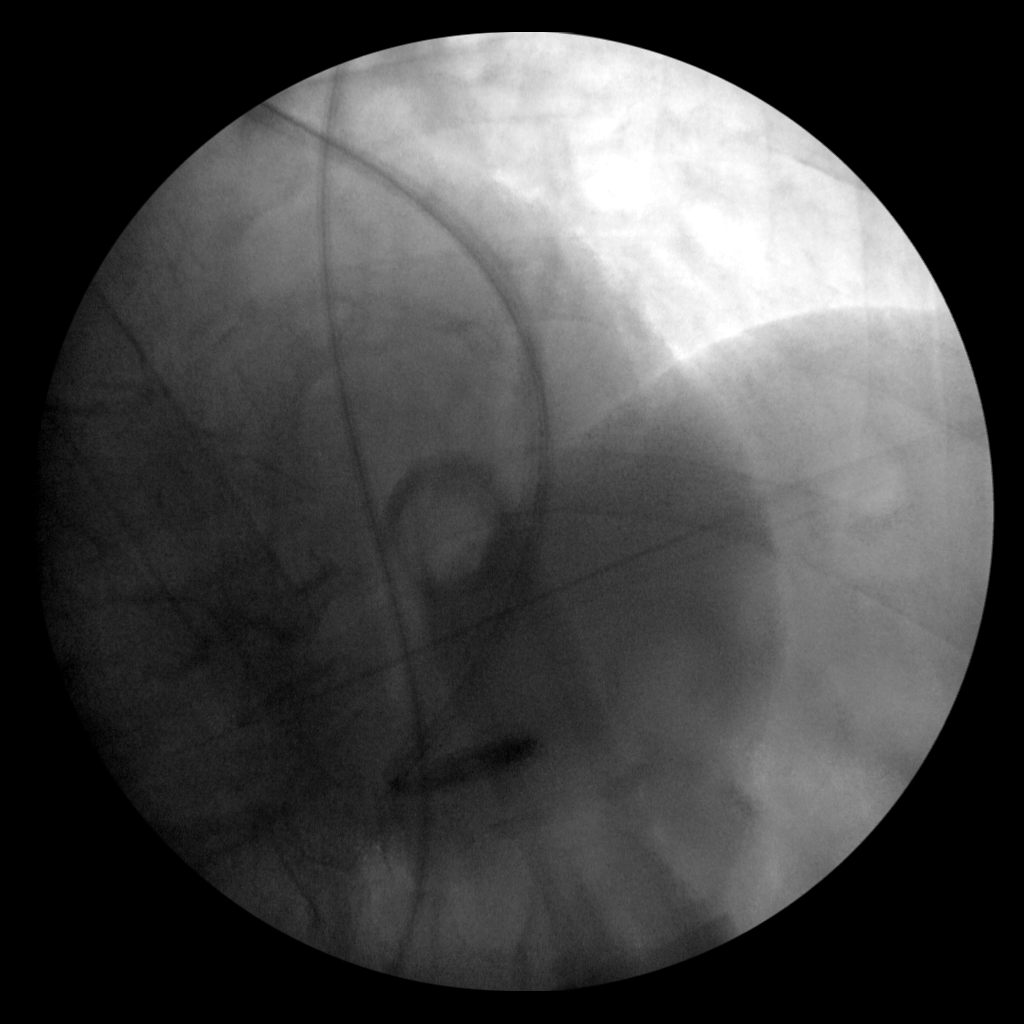

[2 of 2 positions shown; findings below may reference images not displayed]

FINDINGS: The Panda feeding tube tip is in the hiatal hernia. Various attempts
and maneuvers were made by Radiology technologist personnel and by
myself to position this tube into the intra-portion of the stomach
without success.
IMPRESSION: 1. Panda feeding tube tip in the intrathoracic portion of the hiatal
hernia.

## 2018-09-28 MED ORDER — MIDAZOLAM HCL 2 MG/2ML IJ SOLN
1.0000 mg | Freq: Once | INTRAMUSCULAR | Status: AC
  Administered 2018-09-28: 1 mg via INTRAVENOUS

## 2018-09-28 MED ORDER — SENNOSIDES 8.8 MG/5ML PO SYRP: 10.0000 mL | ORAL_SOLUTION | Freq: Every day | ORAL | Status: DC

## 2018-09-28 MED ORDER — LIDOCAINE VISCOUS HCL 2 % MT SOLN: 15.0000 mL | Freq: Once | OROMUCOSAL | Status: AC

## 2018-09-28 MED ORDER — JEVITY 1.2 CAL PO LIQD
1000.0000 mL | ORAL | Status: DC
  Filled 2018-09-28 (×2): qty 1000

## 2018-09-28 MED ORDER — OXYCODONE HCL 5 MG/5ML PO SOLN: 20.0000 mg | Freq: Four times a day (QID) | ORAL | Status: DC

## 2018-09-28 MED ORDER — POTASSIUM CHLORIDE 10 MEQ/100ML IV SOLN
10.0000 meq | INTRAVENOUS | Status: AC
  Administered 2018-09-28 (×2): 10 meq via INTRAVENOUS
  Filled 2018-09-28 (×2): qty 100

## 2018-09-28 MED ORDER — TRAZODONE HCL 50 MG PO TABS: 25.0000 mg | ORAL_TABLET | Freq: Every day | ORAL | Status: DC

## 2018-09-28 MED ORDER — MIDAZOLAM HCL 2 MG/2ML IJ SOLN
INTRAMUSCULAR | Status: AC
  Administered 2018-09-28: 1 mg via INTRAVENOUS
  Filled 2018-09-28: qty 2

## 2018-09-28 MED ORDER — FENTANYL CITRATE (PF) 100 MCG/2ML IJ SOLN: 100.0000 ug | Freq: Once | INTRAMUSCULAR | Status: DC

## 2018-09-28 MED ORDER — IOPAMIDOL (ISOVUE-300) INJECTION 61%: 50.0000 mL | Freq: Once | INTRAVENOUS | Status: DC | PRN

## 2018-09-28 MED ORDER — FREE WATER: 100.0000 mL | Freq: Three times a day (TID) | Status: DC

## 2018-09-28 MED ORDER — BETHANECHOL CHLORIDE 5 MG PO TABS
5.0000 mg | ORAL_TABLET | Freq: Every day | ORAL | Status: DC
  Filled 2018-09-28: qty 1

## 2018-09-28 MED ORDER — LIDOCAINE VISCOUS HCL 2 % MT SOLN
OROMUCOSAL | Status: AC
  Administered 2018-09-28: 15:00:00
  Filled 2018-09-28: qty 15

## 2018-09-28 MED ORDER — POTASSIUM CHLORIDE 20 MEQ/15ML (10%) PO SOLN: 40.0000 meq | Freq: Once | ORAL | Status: DC

## 2018-09-28 MED ORDER — FENTANYL BOLUS VIA INFUSION
100.0000 ug | Freq: Once | INTRAVENOUS | Status: AC
  Administered 2018-09-28: 100 ug via INTRAVENOUS
  Filled 2018-09-28: qty 100

## 2018-09-28 MED ORDER — MIDAZOLAM HCL 2 MG/2ML IJ SOLN
INTRAMUSCULAR | Status: AC
  Filled 2018-09-28: qty 2

## 2018-09-28 MED ORDER — IOPAMIDOL (ISOVUE-300) INJECTION 61%
INTRAVENOUS | Status: AC
  Administered 2018-09-28: 15:00:00
  Filled 2018-09-28: qty 50

## 2018-09-28 NOTE — Progress Notes (Signed)
Cortrak Tube Team Note:  Cortrak tube noted to be within hiatal hernia. Recommend IR placement of post pyloric feeding tube placement.    Koleen Distance MS, RD, LDN Pager #- [phone] Office#- [phone] After Hours Pager: [phone]

## 2018-09-28 NOTE — Progress Notes (Signed)
[place] Progress Note Patient Name: Riley Eaton DOB: 1941-08-09 MRN: [id number]   Date of Service  09/28/2018  HPI/Events of Note  Request to renew order for tele-sitter.   eICU Interventions  Will renew order for tele-sitter.      Intervention Category Major Interventions: Delirium, psychosis, severe agitation - evaluation and management  Sommer,Steven Eugene 09/28/2018, 8:08 PM

## 2018-09-28 NOTE — Consult Note (Addendum)
NAME:  Riley Eaton, MRN:  [id number], DOB:  1940/09/14, LOS: 3 ADMISSION DATE:  09/24/2018, CONSULTATION DATE:  09/27/2018 REFERRING MD:  Candiss Norse, CHIEF COMPLAINT:  Confusion.   HPI  78 year old man who is being admitted to the ICU for severe delirium requiring Precedex.    He has been confused since admission on 2/13 with ascending cholangitis from obstructing CBD stone removed by ERCP.  Despite resolution of cholangitis, patient remains delirious despite BZD and haloperidol. Wife reports no drug use apart form high dose narcotics for back pain.  Past Medical History  Chronic back pain  Chronic opioid use  Thoracic aortic aneurysm     Interim history/subjective:  RN reports pt pending Cortrak placement, continues to have agitated delirium.    Objective   Blood pressure (!) 161/92, pulse 100, temperature 98.5 F (36.9 C), temperature source Oral, resp. rate (!) 23, height 5\' 7"  (1.702 m), weight 71.7 kg, SpO2 98 %.        Intake/Output Summary (Last 24 hours) at 09/28/2018 1110 Last data filed at 09/28/2018 0700 Gross per 24 hour  Intake 1568.22 ml  Output 700 ml  Net 868.22 ml   Filed Weights   09/25/18 0500  Weight: 71.7 kg    Examination: General: elderly male lying in bed, appears uncomfortable  HEENT: MM pink/dry Neuro: Awakens to voice, speech clear but not oriented  CV: s1s2 rrr, no m/r/g PULM: even/non-labored, lungs bilaterally clear  GI: soft, non-tender, bsx4 active  Extremities: warm/dry, no edema  Skin: no rashes or lesions  Ancillary tests     CBC: Recent Labs  Lab 09/24/18 2050 09/25/18 0555 09/26/18 0456 09/27/18 0528 09/28/18 0252  WBC 9.0 11.5* 11.1* 9.9 7.8  NEUTROABS  --  9.5*  --   --   --   HGB 13.5 13.6 13.4 13.2 12.4*  HCT 38.2* 38.6* 36.9* 37.1* 35.4*  MCV 89.5 89.8 88.7 88.1 89.8  PLT 175 142* 120* 124* 124*    Basic Metabolic Panel: Recent Labs  Lab 09/24/18 2050 09/24/18 2102 09/25/18 0555 09/26/18 0456 09/27/18 0528  09/27/18 1102 09/28/18 0252  NA 138  --  141 140 145  --  147*  K 4.0  --  3.4* 3.3* 3.0*  --  3.1*  CL 104  --  109 107 115*  --  119*  CO2 21*  --  17* 19* 18*  --  18*  GLUCOSE 165*  --  131* 157* 133*  --  139*  BUN 16  --  18 17 30*  --  39*  CREATININE 0.80 0.60* 1.13 0.72 0.77  --  0.73  CALCIUM 9.4  --  9.6 9.3 9.2  --  9.2  MG  --   --  1.8 1.8 2.0  --  2.3  PHOS  --   --   --   --   --  3.2 3.5   GFR: Estimated Creatinine Clearance: 70 mL/min (by C-G formula based on SCr of 0.73 mg/dL). Recent Labs  Lab 09/25/18 0010 09/25/18 0555 09/26/18 0456 09/27/18 0528 09/27/18 1102 09/27/18 1401 09/28/18 0252  WBC  --  11.5* 11.1* 9.9  --   --  7.8  LATICACIDVEN 1.4  --   --   --  1.1 0.9  --     Liver Function Tests: Recent Labs  Lab 09/24/18 2058 09/25/18 0555 09/26/18 0456 09/27/18 0528 09/28/18 0252  AST 32 34 160* 104* 91*  ALT 17 17 61* 65*  67*  ALKPHOS 96 94 119 113 95  BILITOT 1.4* 1.8* 3.7* 2.2* 1.9*  PROT 6.7 7.0 6.8 6.9 6.4*  ALBUMIN 4.0 4.2 3.9 3.8 3.6   Recent Labs  Lab 09/24/18 2058 09/26/18 0456  LIPASE 23 46   Recent Labs  Lab 09/27/18 1102  AMMONIA 30    ABG No results found for: PHART, PCO2ART, PO2ART, HCO3, TCO2, ACIDBASEDEF, O2SAT   Coagulation Profile: No results for input(s): INR, PROTIME in the last 168 hours.  Cardiac Enzymes: Recent Labs  Lab 09/25/18 0555  TROPONINI 0.04*    HbA1C: No results found for: HGBA1C  CBG: No results for input(s): GLUCAP in the last 168 hours.   Assessment & Plan:   Agitated Delirium  -in setting of chronic benzo's, acute ascending cholangitis  -ammonia 30 P: Continue precedex gtt for delirium  Place Cortrack > resume 1/2 dose of home narcotics and wean IV fentanyl gtt Resume home trazodone  Promote sleep / wake cycle   Acute Ascending Cholangitis s/p ERCP & Sphincterotomy P: CCS following > no plan for surgical intervention at this time Stop abx / ok with  CCS Recommendations per CCS / GI  Chronic Pain  -high dose use at home P: Continue fentanyl gtt for now, hold increase but may be under dosing him  Questionable AF  P: Appears to be new overnight 2/16 into 2/17 Would not likely be able to get EKG given agitation.  Heart tones regular on exam.  When he was still rhythm appeared to SR.  Monitor closely.  Monitor for now  HTN  P: ICU monitoring  PRN hydralazine   BPH  P: Change flomax to bethanachol while inpatient  Continue foley catheter > required coude  Electrolyte Disturbance: Hyponatremia, Hypokalemia  P: Add free water once cortrack placed KCL replacement   Best practice:  Diet: NPO Pain/Anxiety/Delirium protocol (if indicated): precedex and fentanyl VAP protocol (if indicated): n/a DVT prophylaxis: UFH GI prophylaxis: N/a Glucose control: monitor Mobility: bedrest - Foley catheter required due to BPH and need to follow urine output. Code Status: Full Family Communication: Wife, daughter updated 2/17 Disposition: ICU   Critical care time: 52 minutes     Noe Gens, NP-C [facility] Pgr: [phone] or if no answer [phone] 09/28/2018, 11:18 AM

## 2018-09-28 NOTE — Procedures (Signed)
Cortrak  Tube Type:  Cortrak - 43 inches Tube Location:  Left nare Initial Placement:  Stomach Secured by: Bridle Technique Used to Measure Tube Placement:  Documented cm marking at nare/ corner of mouth Cortrak Secured At:  60 cm    Cortrak Tube Team Note:  Consult received to place a Cortrak feeding tube.   X-ray is required, abdominal x-ray has been ordered by the Cortrak team. Please confirm tube placement before using the Cortrak tube.   If the tube becomes dislodged please keep the tube and contact the Cortrak team at [email or web address] (password [email or web address]) for replacement.  If after hours and replacement cannot be delayed, place a NG tube and confirm placement with an abdominal x-ray.    Koleen Distance MS, RD, LDN Pager #- [phone] Office#- [phone] After Hours Pager: [phone]'

## 2018-09-28 NOTE — Progress Notes (Signed)
Initial Nutrition Assessment  DOCUMENTATION CODES:   Severe malnutrition in context of acute illness/injury  INTERVENTION:   Initiate tube feeding via Cortrak once advanced past hiatal hernia and placement confirmed: - Jevity 1.2 @ 65 ml/hr (1560 ml/day)  Tube feeding regimen provides 1872 kcal, 87 grams of protein, and 1259 ml of H2O (100% of needs).  NUTRITION DIAGNOSIS:   Severe Malnutrition related to acute illness (ascending cholangitis from obstructing CBD stone) as evidenced by moderate fat depletion, moderate muscle depletion, severe muscle depletion.  GOAL:   Patient will meet greater than or equal to 90% of their needs  MONITOR:   Diet advancement, Weight trends, Labs, TF tolerance, I & O's  REASON FOR ASSESSMENT:   Consult Enteral/tube feeding initiation and management  ASSESSMENT:   78 year old male who presented to the ED on 2/13 with chest pain and agitation. PMH significant for chronic back pain. Work-up revealed gallstones and mild elevation of bilirubin. Pt is s/p ERCP, pancreatic stent placement, removal of stones, sphincterotomy on 2/14.  Pt awaiting cholecystectomy but not a candidate at this time due to delirium related to narcotic withdrawal.  Discussed pt with RN. Cortrak placed today, tip in hiatal hernia per x-ray. Contacted RN regarding this and IR consult placed to advance tube. Critical care NP approved consult to start TF. Verbal with readback order placed.  No family present at time of RD visit.  Medications reviewed and include: Senokot, D5 @ 50 ml/hr, Fentanyl @ 5 ml/hr, Precedex @ 21.5 ml/hr, 40 mEq KCl once  Labs reviewed: sodium 147 (H), potassium 3.1 (L), BUN 39 (H), elevated LFTs  UOP: 700 ml x 24 hours I/O's: -1.0 L since admit  NUTRITION - FOCUSED PHYSICAL EXAM:    Most Recent Value  Orbital Region  Moderate depletion  Upper Arm Region  Moderate depletion  Thoracic and Lumbar Region  Moderate depletion  Buccal Region   Moderate depletion  Temple Region  Moderate depletion  Clavicle Bone Region  Severe depletion  Clavicle and Acromion Bone Region  Severe depletion  Scapular Bone Region  Unable to assess  Dorsal Hand  Moderate depletion  Patellar Region  Moderate depletion  Anterior Thigh Region  Moderate depletion  Posterior Calf Region  Moderate depletion  Edema (RD Assessment)  None  Hair  Reviewed  Eyes  Reviewed  Mouth  Reviewed  Skin  Reviewed  Nails  Reviewed       Diet Order:   Diet Order            Diet NPO time specified Except for: Sips with Meds  Diet effective now              EDUCATION NEEDS:   Not appropriate for education at this time  Skin:  Skin Assessment: Reviewed RN Assessment  Last BM:  2/15 (large type 7)  Height:   Ht Readings from Last 1 Encounters:  09/25/18 5\' 7"  (1.702 m)    Weight:   Wt Readings from Last 1 Encounters:  09/25/18 71.7 kg    Ideal Body Weight:  67.3 kg  BMI:  Body mass index is 24.75 kg/m.  Estimated Nutritional Needs:   Kcal:  1800-2000  Protein:  85-100 grams  Fluid:  1.8-2.0 L    Gaynell Face, MS, RD, LDN Inpatient Clinical Dietitian Pager: [phone] Weekend/After Hours: [phone]

## 2018-09-28 NOTE — Progress Notes (Signed)
  Subjective/Chief Complaint: Pt con't with delirium. On Precedex in ICU.   Objective: Vital signs in last 24 hours: Temp:  [97.4 F (36.3 C)-98.9 F (37.2 C)] 98.5 F (36.9 C) (02/17 0700) Pulse Rate:  [34-122] 111 (02/17 0700) Resp:  [10-26] 23 (02/17 0700) BP: (112-163)/(62-99) 160/97 (02/17 0700) SpO2:  [92 %-100 %] 98 % (02/17 0700) Last BM Date: 09/26/18  Intake/Output from previous day: 02/16 0701 - 02/17 0700 In: 1775.4 [I.V.:1136.7; IV Piggyback:638.8] Out: 700 [Urine:700] Intake/Output this shift: No intake/output data recorded.  Constitutional: confused, writhing in bed, appears states age. Eyes: Anicteric sclerae, moist conjunctiva, no lid lag Lungs: Clear to auscultation bilaterally, normal respiratory effort CV: tachy and rhythm, no murmurs, no peripheral edema, pedal pulses 2+ GI: Soft, no masses or hepatosplenomegaly, non-tender to palpation Skin: No rashes, palpation reveals normal turgor Psychiatric: delirious   Lab Results:  Recent Labs    09/27/18 0528 09/28/18 0252  WBC 9.9 7.8  HGB 13.2 12.4*  HCT 37.1* 35.4*  PLT 124* 124*   BMET Recent Labs    09/27/18 0528 09/28/18 0252  NA 145 147*  K 3.0* 3.1*  CL 115* 119*  CO2 18* 18*  GLUCOSE 133* 139*  BUN 30* 39*  CREATININE 0.77 0.73  CALCIUM 9.2 9.2   Anti-infectives: Anti-infectives (From admission, onward)   Start     Dose/Rate Route Frequency Ordered Stop   09/25/18 1200  piperacillin-tazobactam (ZOSYN) IVPB 3.375 g     3.375 g 12.5 mL/hr over 240 Minutes Intravenous Every 8 hours 09/25/18 0502     09/25/18 0515  piperacillin-tazobactam (ZOSYN) IVPB 3.375 g     3.375 g 100 mL/hr over 30 Minutes Intravenous  Once 09/25/18 0501 09/25/18 0844      Assessment/Plan: 48M with choledocholithiasis s/p ERCP and sphincterotomy HTN Delirium Narcotic withdrawal  Pt currently appears to be delirious from what has been dx'd to be from narcotic withdrawal.   In this condition pt would  be at a high risk for surgery and likely not be extubated in his current condition.  Pt's acute issue of choledocholithiasis has been addressed and pt had no signs of acute cholecystitis.  There is no urgent/emergent need for lap chole at this time- and no plans for surgery at this time.  Would rec f/u as outpt for lap chole or reconsult if delirium clears up on hospital stay.   Pt OK to be off Abx from gallbladder standpoint.      LOS: 3 days    Ralene Ok 09/28/2018

## 2018-09-29 DIAGNOSIS — E876 Hypokalemia: Secondary | ICD-10-CM

## 2018-09-29 DIAGNOSIS — E870 Hyperosmolality and hypernatremia: Secondary | ICD-10-CM

## 2018-09-29 LAB — BASIC METABOLIC PANEL
Anion gap: 5 (ref 5–15)
BUN: 28 mg/dL — ABNORMAL HIGH (ref 8–23)
CO2: 22 mmol/L (ref 22–32)
Calcium: 8.3 mg/dL — ABNORMAL LOW (ref 8.9–10.3)
Chloride: 111 mmol/L (ref 98–111)
Creatinine, Ser: 0.63 mg/dL (ref 0.61–1.24)
GFR calc Af Amer: >60 mL/min (ref >60)
GFR calc non Af Amer: >60 mL/min (ref >60)
Glucose, Bld: 111 mg/dL — ABNORMAL HIGH (ref 70–99)
Potassium: 2.9 mmol/L — ABNORMAL LOW (ref 3.5–5.1)
Sodium: 138 mmol/L (ref 135–145)

## 2018-09-29 LAB — CBC
HCT: 36.4 % — ABNORMAL LOW (ref 39.0–52.0)
Hemoglobin: 12.6 g/dL — ABNORMAL LOW (ref 13.0–17.0)
MCH: 31.5 pg (ref 26.0–34.0)
MCHC: 34.6 g/dL (ref 30.0–36.0)
MCV: 91 fL (ref 80.0–100.0)
Platelets: 127 10*3/uL — ABNORMAL LOW (ref 150–400)
RBC: 4 MIL/uL — ABNORMAL LOW (ref 4.22–5.81)
RDW: 13.8 % (ref 11.5–15.5)
WBC: 9 10*3/uL (ref 4.0–10.5)
nRBC: 0 % (ref 0.0–0.2)

## 2018-09-29 LAB — COMPREHENSIVE METABOLIC PANEL
ALT: 86 U/L — ABNORMAL HIGH (ref 0–44)
AST: 113 U/L — ABNORMAL HIGH (ref 15–41)
Albumin: 3.4 g/dL — ABNORMAL LOW (ref 3.5–5.0)
Alkaline Phosphatase: 92 U/L (ref 38–126)
Anion gap: 9 (ref 5–15)
BUN: 31 mg/dL — ABNORMAL HIGH (ref 8–23)
CO2: 22 mmol/L (ref 22–32)
Calcium: 8.8 mg/dL — ABNORMAL LOW (ref 8.9–10.3)
Chloride: 119 mmol/L — ABNORMAL HIGH (ref 98–111)
Creatinine, Ser: 0.69 mg/dL (ref 0.61–1.24)
GFR calc Af Amer: >60 mL/min (ref >60)
GFR calc non Af Amer: >60 mL/min (ref >60)
Glucose, Bld: 134 mg/dL — ABNORMAL HIGH (ref 70–99)
Potassium: 3.2 mmol/L — ABNORMAL LOW (ref 3.5–5.1)
Sodium: 150 mmol/L — ABNORMAL HIGH (ref 135–145)
Total Bilirubin: 1.7 mg/dL — ABNORMAL HIGH (ref 0.3–1.2)
Total Protein: 6.2 g/dL — ABNORMAL LOW (ref 6.5–8.1)

## 2018-09-29 LAB — MAGNESIUM: Magnesium: 2.1 mg/dL (ref 1.7–2.4)

## 2018-09-29 LAB — PHOSPHORUS: Phosphorus: 3.6 mg/dL (ref 2.5–4.6)

## 2018-09-29 MED ORDER — OXYCODONE HCL 5 MG PO TABS
20.0000 mg | ORAL_TABLET | Freq: Four times a day (QID) | ORAL | Status: DC
  Administered 2018-09-29 – 2018-09-30 (×5): 20 mg via ORAL
  Filled 2018-09-29 (×5): qty 4

## 2018-09-29 MED ORDER — DULOXETINE HCL 60 MG PO CPEP
60.0000 mg | ORAL_CAPSULE | Freq: Every day | ORAL | Status: DC
  Administered 2018-09-29 – 2018-10-06 (×8): 60 mg via ORAL
  Filled 2018-09-29 (×8): qty 1

## 2018-09-29 MED ORDER — POTASSIUM CHLORIDE 20 MEQ/15ML (10%) PO SOLN: 40.0000 meq | Freq: Once | ORAL | Status: DC

## 2018-09-29 MED ORDER — POTASSIUM CHLORIDE 20 MEQ/15ML (10%) PO SOLN
40.0000 meq | Freq: Once | ORAL | Status: AC
  Administered 2018-09-29: 40 meq via ORAL
  Filled 2018-09-29: qty 30

## 2018-09-29 MED ORDER — OXYCODONE HCL ER 20 MG PO T12A: 20.0000 mg | EXTENDED_RELEASE_TABLET | Freq: Four times a day (QID) | ORAL | Status: DC

## 2018-09-29 MED ORDER — DEXTROSE 5 % IV SOLN
INTRAVENOUS | Status: DC
  Administered 2018-09-29: 20 mL/h via INTRAVENOUS

## 2018-09-29 MED ORDER — POTASSIUM CHLORIDE 10 MEQ/100ML IV SOLN
10.0000 meq | INTRAVENOUS | Status: DC
  Administered 2018-09-29: 10 meq via INTRAVENOUS
  Filled 2018-09-29: qty 100

## 2018-09-29 NOTE — Progress Notes (Addendum)
Cortrak Tube Team Note:  Spoke with RN this morning regarding failed Cortrak tube placement yesterday. Cortrak team attempted to place tube x 1 hour. Additional RD and RN in room. However, abdominal x-ray revealed the tube was coiled in the hiatal hernia. IR was consulted to attempt to advance tube into stomach. Per abdominal x-ray after IR attempts, "The Panda feeding tube is in the hiatal hernia. Various attempts and maneuvers were made by Radiology technologist personnel and by myself to position this tube into the intra-portion of the stomach without success."  Communicated this information with RN and critical care NP. Cortrak team is available M-W-F-Sa and could re-attempt tube placement tomorrow (pt pulled tube last night) if needed but suspect the hiatal hernia would continue to be an issue in advancing tube into stomach.   Gaynell Face, MS, RD, LDN Inpatient Clinical Dietitian Pager: [phone] Weekend/After Hours: [phone]

## 2018-09-29 NOTE — Evaluation (Signed)
Clinical/Bedside Swallow Evaluation Patient Details  Name: Riley Eaton MRN: [id number] Date of Birth: 03-04-1941  Today's Date: 09/29/2018 Time: SLP Start Time (ACUTE ONLY): 1016 SLP Stop Time (ACUTE ONLY): 1040 SLP Time Calculation (min) (ACUTE ONLY): 24 min  Past Medical History:  Past Medical History:  Diagnosis Date  . Chronic back pain   . 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 Chronic, continuous use of opioids    for back pain  . Coronary artery calcification seen on CT scan   . Nerve pain   . Thoracic aortic aneurysm [facility])    Past Surgical History:  Past Surgical History:  Procedure Laterality Date  . BACK SURGERY     09-2016  . back surgey    . ENDOSCOPIC RETROGRADE CHOLANGIOPANCREATOGRAPHY (ERCP) WITH PROPOFOL N/A 09/25/2018   Procedure: ENDOSCOPIC RETROGRADE CHOLANGIOPANCREATOGRAPHY (ERCP) WITH PROPOFOL;  Surgeon: Ronnette Juniper, MD;  Location: [place];  Service: Gastroenterology;  Laterality: N/A;  . PANCREATIC STENT PLACEMENT  09/25/2018   Procedure: PANCREATIC STENT PLACEMENT;  Surgeon: Ronnette Juniper, MD;  Location: [place];  Service: Gastroenterology;;  . REMOVAL OF STONES  09/25/2018   Procedure: REMOVAL OF STONES;  Surgeon: Ronnette Juniper, MD;  Location: [place];  Service: Gastroenterology;;  . SPHINCTEROTOMY  09/25/2018   Procedure: SPHINCTEROTOMY;  Surgeon: Ronnette Juniper, MD;  Location: [facility] END[facility];  Service: Gastroenterology;;   HPI:  Pt is a 78 yo male admitted with AMS and ascending cholangitis from obstructing CBD stone s/p ERCP and sphincterotomy. CXR on admission showed large hiatal hernia but no active disease. PMH: chronic back pain   Assessment / Plan / Recommendation Clinical Impression  Pt's oropharyngeal swallow appears to be primarily impacted by altered mentation. He has decreased awareness and attention, requiring Mod cues for bolus acceptance and posterior transit of POs, particularly with more solid textures. He has decreased oral holding  with purees and thin liquids. Immediate coughing was noted with thin liquids when consumed in larger volumes, when distracted by environment, or when used as a liquid wash to clear solid residue. Recommend maintaining NPO status except for meds crushed in puree and sips of water througout the day with full supervision for pacing. SLP will f/u for readiness to start PO diet as mentation improves. SLP Visit Diagnosis: Dysphagia, unspecified (R13.10)    Aspiration Risk  Mild aspiration risk;Moderate aspiration risk    Diet Recommendation NPO except meds;Free water protocol after oral care   Liquid Administration via: Straw Medication Administration: Crushed with puree Supervision: Staff to assist with self feeding;Full supervision/cueing for compensatory strategies Compensations: Slow rate;Small sips/bites Postural Changes: Seated upright at 90 degrees;Remain upright for at least 30 minutes after po intake    Other  Recommendations Oral Care Recommendations: Oral care QID   Follow up Recommendations (tba)      Frequency and Duration min 2x/week  2 weeks       Prognosis Prognosis for Safe Diet Advancement: Good      Swallow Study   General HPI: Pt is a 78 yo male admitted with AMS and ascending cholangitis from obstructing CBD stone s/p ERCP and sphincterotomy. CXR on admission showed large hiatal hernia but no active disease. PMH: chronic back pain Type of Study: Bedside Swallow Evaluation Previous Swallow Assessment: none in chart Diet Prior to this Study: NPO Temperature Spikes Noted: No Respiratory Status: Nasal cannula History of Recent Intubation: No Behavior/Cognition: Alert;Cooperative;Pleasant mood;Confused;Requires cueing Oral Cavity Assessment: Within Functional Limits Oral Care Completed by SLP: Yes Oral Cavity - Dentition:  Missing dentition Self-Feeding Abilities: Needs assist Patient Positioning: Upright in bed Baseline Vocal Quality: Normal Volitional Cough:  Strong Volitional Swallow: Able to elicit    Oral/Motor/Sensory Function Overall Oral Motor/Sensory Function: Within functional limits   Ice Chips Ice chips: Impaired Presentation: Spoon Oral Phase Functional Implications: Oral holding   Thin Liquid Thin Liquid: Impaired Presentation: Spoon;Straw Pharyngeal  Phase Impairments: Cough - Immediate    Nectar Thick Nectar Thick Liquid: Not tested   Honey Thick Honey Thick Liquid: Not tested   Puree Puree: Impaired Presentation: Spoon Oral Phase Impairments: Poor awareness of bolus   Solid     Solid: Impaired Oral Phase Impairments: Impaired mastication;Poor awareness of bolus Pharyngeal Phase Impairments: Cough - Immediate      Venita Sheffield Jaz Laningham 09/29/2018,11:10 AM  Germain Osgood Siani Utke, M.A. [place] Acute Environmental education officer [phone] Office [phone]

## 2018-09-29 NOTE — Progress Notes (Signed)
Nutrition Follow-up  DOCUMENTATION CODES:   Severe malnutrition in context of acute illness/injury  INTERVENTION:   - Recommend obtaining weight, admission weight appears to be stated rather than measured  - Will monitor for diet advancement and supplement as appropriate  NUTRITION DIAGNOSIS:   Severe Malnutrition related to acute illness (ascending cholangitis from obstructing CBD stone) as evidenced by moderate fat depletion, moderate muscle depletion, severe muscle depletion.  Ongoing  GOAL:   Patient will meet greater than or equal to 90% of their needs  Unmet  MONITOR:   Diet advancement, Weight trends, Labs, TF tolerance, I & O's  REASON FOR ASSESSMENT:   Consult Enteral/tube feeding initiation and management  ASSESSMENT:   78 year old male who presented to the ED on 2/13 with chest pain and agitation. PMH significant for chronic back pain. Work-up revealed gallstones and mild elevation of bilirubin. Pt is s/p ERCP, pancreatic stent placement, removal of stones, sphincterotomy on 2/14.  Pt awaiting cholecystectomy but not a candidate at this time due to delirium related to narcotic withdrawal.  Cortrak team attempted to place NGT yesterday but x-ray revealed tube was in the hiatal hernia. IR attempted to advance tube past hernia into stomach but was unable to do so. Pt pulled Cortrak last night.  Per SLP, pt able to take medications by mouth but no diet at this time. RN and SLP hopeful that medications will help bring pt back to baseline mental status and enable him to take PO's. Will continue to monitor for diet advancement.  TF ordered but no enteral access. Will d/c tube feeding orders.  Medications reviewed and include: Precedex @ 14.1, D5 @ 20 ml/hr, Fentanyl @ 4.3 nl/hr  Labs reviewed: sodium 150 (H), potassium 3.2 (L), BUN 31 (H), elevated LFTs  UOP: 1210 ml x 24 hours  Diet Order:   Diet Order            Diet NPO time specified Except for: Other  (See Comments)  Diet effective now              EDUCATION NEEDS:   Not appropriate for education at this time  Skin:  Skin Assessment: Reviewed RN Assessment  Last BM:  2/15 (large type 7)  Height:   Ht Readings from Last 1 Encounters:  09/25/18 5\' 7"  (1.702 m)    Weight:   Wt Readings from Last 1 Encounters:  09/25/18 71.7 kg    Ideal Body Weight:  67.3 kg  BMI:  Body mass index is 24.75 kg/m.  Estimated Nutritional Needs:   Kcal:  1800-2000  Protein:  85-100 grams  Fluid:  1.8-2.0 L    Gaynell Face, MS, RD, LDN Inpatient Clinical Dietitian Pager: [phone] Weekend/After Hours: [phone]

## 2018-09-29 NOTE — Progress Notes (Signed)
Patient extremely agitated and restless all night. Pulled his NG tube and broke IV tubing during night.  Had tele sitter.

## 2018-09-29 NOTE — Progress Notes (Addendum)
Pt cognition improving, has been oriented x3/4. Bilateral wrist restraints removed &  Pt educated to not pull on lines, foley, equipment nor get OOB without assistance. Pt has agreed to this and is calm/ resting in bed. Sitter still neccessary. RN will continue to monitor.  @ 2300- Pt having frequent ectopy, STAT BMET order placed. K- 2.9. E link MD notified- see new orders

## 2018-09-29 NOTE — Progress Notes (Signed)
[facility] Progress Note Patient Name: TALAL FRITCHMAN DOB: 1941-08-03 MRN: [id number]   Date of Service  09/29/2018  HPI/Events of Note  K+ = 3.2 and Creatinine = 0.69.   eICU Interventions  Will replace K+.      Intervention Category Major Interventions: Electrolyte abnormality - evaluation and management  Sommer,Steven Eugene 09/29/2018, 6:21 AM

## 2018-09-29 NOTE — Consult Note (Signed)
NAME:  Riley Eaton, MRN:  [id number], DOB:  1941/01/25, LOS: 4 ADMISSION DATE:  09/24/2018, CONSULTATION DATE:  09/27/2018 REFERRING MD:  Candiss Norse, CHIEF COMPLAINT:  Confusion.   HPI  78 year old man who is being admitted to the ICU for severe delirium requiring Precedex.    He has been confused since admission on 2/13 with ascending cholangitis from obstructing CBD stone removed by ERCP.  Despite resolution of cholangitis, patient remains delirious despite BZD and haloperidol. Wife reports no drug use apart form high dose narcotics for back pain.  Past Medical History  Chronic back pain  Chronic opioid use  Thoracic aortic aneurysm   Narx Scores Narcotic 562 Sedative 531 Over all risk score 440/999    Interim history/subjective:  Pulled cor track 2/18/ 2020 early am Previous one needed to be placed under IR, took 3 hours Currently receiving Fentanyl ay 50 mcg per hour and Precedex at 1.0 IV We are currently meeting only 50% of what he takes at home.  Objective   Blood pressure (!) 156/100, pulse (!) 111, temperature 98 F (36.7 C), temperature source Axillary, resp. rate 19, height 5\' 7"  (1.702 m), weight 71.7 kg, SpO2 91 %.        Intake/Output Summary (Last 24 hours) at 09/29/2018 0921 Last data filed at 09/29/2018 0800 Gross per 24 hour  Intake 1576.82 ml  Output 1070 ml  Net 506.82 ml   Filed Weights   09/25/18 0500  Weight: 71.7 kg    Examination: General: elderly male lying in bed, appears , moans at intervals, HEENT: MM pink/dry, NCAT Neuro: Awakens to voice, speech clear but not oriented, follows commands, asks appropriate  questions CV: s1s2 rrr, no m/r/g PULM: Bilateral chest excursion,  even/non-labored, lungs bilaterally clear on [place] GI: soft, non-tender, bs x 4 active  Extremities: warm/dry, no edema  Skin: no rashes or lesions, warm and dry  Ancillary tests     CBC: Recent Labs  Lab 09/25/18 0555 09/26/18 0456 09/27/18 0528 09/28/18 0252  09/29/18 0241  WBC 11.5* 11.1* 9.9 7.8 9.0  NEUTROABS 9.5*  --   --   --   --   HGB 13.6 13.4 13.2 12.4* 12.6*  HCT 38.6* 36.9* 37.1* 35.4* 36.4*  MCV 89.8 88.7 88.1 89.8 91.0  PLT 142* 120* 124* 124* 127*    Basic Metabolic Panel: Recent Labs  Lab 09/25/18 0555 09/26/18 0456 09/27/18 0528 09/27/18 1102 09/28/18 0252 09/29/18 0241  NA 141 140 145  --  147* 150*  K 3.4* 3.3* 3.0*  --  3.1* 3.2*  CL 109 107 115*  --  119* 119*  CO2 17* 19* 18*  --  18* 22  GLUCOSE 131* 157* 133*  --  139* 134*  BUN 18 17 30*  --  39* 31*  CREATININE 1.13 0.72 0.77  --  0.73 0.69  CALCIUM 9.6 9.3 9.2  --  9.2 8.8*  MG 1.8 1.8 2.0  --  2.3 2.1  PHOS  --   --   --  3.2 3.5 3.6   GFR: Estimated Creatinine Clearance: 70 mL/min (by C-G formula based on SCr of 0.69 mg/dL). Recent Labs  Lab 09/25/18 0010  09/26/18 0456 09/27/18 0528 09/27/18 1102 09/27/18 1401 09/28/18 0252 09/29/18 0241  WBC  --    < > 11.1* 9.9  --   --  7.8 9.0  LATICACIDVEN 1.4  --   --   --  1.1 0.9  --   --    < > =  values in this interval not displayed.    Liver Function Tests: Recent Labs  Lab 09/25/18 0555 09/26/18 0456 09/27/18 0528 09/28/18 0252 09/29/18 0241  AST 34 160* 104* 91* 113*  ALT 17 61* 65* 67* 86*  ALKPHOS 94 119 113 95 92  BILITOT 1.8* 3.7* 2.2* 1.9* 1.7*  PROT 7.0 6.8 6.9 6.4* 6.2*  ALBUMIN 4.2 3.9 3.8 3.6 3.4*   Recent Labs  Lab 09/24/18 2058 09/26/18 0456  LIPASE 23 46   Recent Labs  Lab 09/27/18 1102  AMMONIA 30    ABG No results found for: PHART, PCO2ART, PO2ART, HCO3, TCO2, ACIDBASEDEF, O2SAT   Coagulation Profile: No results for input(s): INR, PROTIME in the last 168 hours.  Cardiac Enzymes: Recent Labs  Lab 09/25/18 0555  TROPONINI 0.04*    HbA1C: No results found for: HGBA1C  CBG: No results for input(s): GLUCAP in the last 168 hours.   Assessment & Plan:   Agitated Delirium  -in setting of chronic benzo's, acute ascending cholangitis  -ammonia  30 - more alert 2/18 P: Continue precedex gtt for delirium  Cortrack pulled  Overnight >> Cor Track team refuse to replace >  Give 1/2 dose of home narcotics and wean IV fentanyl gtt when route available Will check swallow 2/18 as he is more alert, perhaps use PO route Resume home trazodone  Promote sleep / wake cycle   Acute Ascending Cholangitis s/p ERCP & Sphincterotomy P: CCS following > no plan for surgical intervention at this time Stop abx / ok with CCS Recommendations per CCS / GI  Trend Lipase, amylase Will evaluate swallow and consider po feeds  Chronic Pain  -high dose use at home P: Continue fentanyl gtt  for now, hold increase but may be under dosing him  Questionable AF  P: Appears to be new overnight 2/16 into 2/17 and 2/18 Would not likely be able to get EKG given agitation.   Heart tones regular on exam.   When he was still rhythm appeared to SR.  Monitor closely.  Continue to monitor for now EKG prn  HTN  But sort on sedation  P: ICU monitoring  PRN hydralazine   BPH  P: Change flomax to bethanachol while inpatient  Continue foley catheter > required coude  Electrolyte Disturbance: Hyponatremia, Hypokalemia  Free water deficit is 3L on 2/18 P: Unable to add  free water as Cor track pulled Will add D5W at 20 per hour Continue ice chips  KCL replacement CXR in am to ensure no volume overload   Best practice:  Diet: NPO>> will check swallow and access if ok to take meds po Pain/Anxiety/Delirium protocol (if indicated): precedex and fentanyl VAP protocol (if indicated): n/a DVT prophylaxis: UFH GI prophylaxis: N/a Glucose control: monitor Mobility: bedrest - Foley catheter required due to BPH and need to follow urine output. Code Status: Full Family Communication: No family at bedside Disposition: ICU   Critical care time: 48 minutes    Magdalen Spatz, AGACNP-BC [place] Pager # [phone] Pager  after 4 pm: [phone] 09/29/2018, 9:21 AM

## 2018-09-30 LAB — AMMONIA: Ammonia: 34 umol/L (ref 9–35)

## 2018-09-30 LAB — MAGNESIUM: Magnesium: 1.9 mg/dL (ref 1.7–2.4)

## 2018-09-30 LAB — COMPREHENSIVE METABOLIC PANEL
ALT: 71 U/L — ABNORMAL HIGH (ref 0–44)
AST: 73 U/L — ABNORMAL HIGH (ref 15–41)
Albumin: 3 g/dL — ABNORMAL LOW (ref 3.5–5.0)
Alkaline Phosphatase: 81 U/L (ref 38–126)
Anion gap: 8 (ref 5–15)
BUN: 20 mg/dL (ref 8–23)
CO2: 21 mmol/L — ABNORMAL LOW (ref 22–32)
Calcium: 8.6 mg/dL — ABNORMAL LOW (ref 8.9–10.3)
Chloride: 109 mmol/L (ref 98–111)
Creatinine, Ser: 0.62 mg/dL (ref 0.61–1.24)
GFR calc Af Amer: >60 mL/min (ref >60)
GFR calc non Af Amer: >60 mL/min (ref >60)
Glucose, Bld: 117 mg/dL — ABNORMAL HIGH (ref 70–99)
Potassium: 3.4 mmol/L — ABNORMAL LOW (ref 3.5–5.1)
Sodium: 138 mmol/L (ref 135–145)
Total Bilirubin: 1.9 mg/dL — ABNORMAL HIGH (ref 0.3–1.2)
Total Protein: 5.5 g/dL — ABNORMAL LOW (ref 6.5–8.1)

## 2018-09-30 LAB — CBC
HCT: 34.9 % — ABNORMAL LOW (ref 39.0–52.0)
Hemoglobin: 12.3 g/dL — ABNORMAL LOW (ref 13.0–17.0)
MCH: 31.8 pg (ref 26.0–34.0)
MCHC: 35.2 g/dL (ref 30.0–36.0)
MCV: 90.2 fL (ref 80.0–100.0)
Platelets: 115 10*3/uL — ABNORMAL LOW (ref 150–400)
RBC: 3.87 MIL/uL — ABNORMAL LOW (ref 4.22–5.81)
RDW: 13.4 % (ref 11.5–15.5)
WBC: 8.8 10*3/uL (ref 4.0–10.5)
nRBC: 0 % (ref 0.0–0.2)

## 2018-09-30 LAB — AMYLASE: Amylase: 41 U/L (ref 28–100)

## 2018-09-30 LAB — LIPASE, BLOOD: Lipase: 18 U/L (ref 11–51)

## 2018-09-30 LAB — PHOSPHORUS: Phosphorus: 3.3 mg/dL (ref 2.5–4.6)

## 2018-09-30 MED ORDER — POTASSIUM CHLORIDE 20 MEQ/15ML (10%) PO SOLN
40.0000 meq | ORAL | Status: DC
  Filled 2018-09-30: qty 30

## 2018-09-30 MED ORDER — POTASSIUM CHLORIDE 10 MEQ/100ML IV SOLN
10.0000 meq | INTRAVENOUS | Status: AC
  Administered 2018-09-30 (×6): 10 meq via INTRAVENOUS
  Filled 2018-09-30: qty 100

## 2018-09-30 MED ORDER — POTASSIUM CHLORIDE CRYS ER 20 MEQ PO TBCR
20.0000 meq | EXTENDED_RELEASE_TABLET | Freq: Once | ORAL | Status: AC
  Administered 2018-09-30: 20 meq via ORAL
  Filled 2018-09-30: qty 1

## 2018-09-30 MED ORDER — OXYCODONE HCL 5 MG PO TABS
30.0000 mg | ORAL_TABLET | Freq: Four times a day (QID) | ORAL | Status: DC
  Administered 2018-09-30 – 2018-10-06 (×21): 30 mg via ORAL
  Filled 2018-09-30 (×23): qty 6

## 2018-09-30 MED ORDER — RESOURCE THICKENUP CLEAR PO POWD
ORAL | Status: DC | PRN
  Administered 2018-09-30: 11:00:00 via ORAL
  Filled 2018-09-30: qty 125

## 2018-09-30 MED ORDER — TAMSULOSIN HCL 0.4 MG PO CAPS
0.4000 mg | ORAL_CAPSULE | Freq: Every day | ORAL | Status: DC
  Administered 2018-09-30 – 2018-10-06 (×7): 0.4 mg via ORAL
  Filled 2018-09-30 (×7): qty 1

## 2018-09-30 MED ORDER — MAGNESIUM SULFATE IN D5W 1-5 GM/100ML-% IV SOLN
1.0000 g | Freq: Once | INTRAVENOUS | Status: AC
  Administered 2018-09-30: 1 g via INTRAVENOUS
  Filled 2018-09-30: qty 100

## 2018-09-30 NOTE — Progress Notes (Signed)
NAME:  Riley Eaton, MRN:  [id number], DOB:  10/16/1940, LOS: 5 ADMISSION DATE:  09/24/2018, CONSULTATION DATE:  09/27/2018 REFERRING MD:  Candiss Norse, CHIEF COMPLAINT:  Confusion.   HPI  78 year old man who is being admitted to the ICU for severe delirium requiring Precedex.    He has been confused since admission on 2/13 with ascending cholangitis from obstructing CBD stone removed by ERCP.  Despite resolution of cholangitis, patient remains delirious despite BZD and haloperidol. Wife reports no drug use apart form high dose narcotics for back pain.  Past Medical History  Chronic back pain  Chronic opioid use  Thoracic aortic aneurysm     Interim history/subjective:  RN reports pt cleared for meds with applesauce per SLP.  Remains on 0.4 mcg/kg/h of precedex, 25 mcg fentanyl.  Improved mentation.    Objective   Blood pressure (!) 147/86, pulse (!) 111, temperature 97.6 F (36.4 C), temperature source Oral, resp. rate 18, height 5\' 7"  (1.702 m), weight 71.7 kg, SpO2 99 %.        Intake/Output Summary (Last 24 hours) at 09/30/2018 1513 Last data filed at 09/30/2018 1400 Gross per 24 hour  Intake 1673.35 ml  Output 1051 ml  Net 622.35 ml   Filed Weights   09/25/18 0500  Weight: 71.7 kg    Examination: General: elderly male lying in bed in NAD, wife at bedside  HEENT: MM pink/moist, no jvd Neuro: Awake, alert, follows commands, MAE  CV: s1s2 rrr, no m/r/g PULM: even/non-labored, lungs bilaterally clear  YO:VZCH, non-tender, bsx4 active, tolerating PO's  Extremities: warm/dry, no edema  Skin: no rashes or lesions  Ancillary tests     CBC: Recent Labs  Lab 09/25/18 0555 09/26/18 0456 09/27/18 0528 09/28/18 0252 09/29/18 0241 09/30/18 0247  WBC 11.5* 11.1* 9.9 7.8 9.0 8.8  NEUTROABS 9.5*  --   --   --   --   --   HGB 13.6 13.4 13.2 12.4* 12.6* 12.3*  HCT 38.6* 36.9* 37.1* 35.4* 36.4* 34.9*  MCV 89.8 88.7 88.1 89.8 91.0 90.2  PLT 142* 120* 124* 124* 127* 115*     Basic Metabolic Panel: Recent Labs  Lab 09/26/18 0456 09/27/18 0528 09/27/18 1102 09/28/18 0252 09/29/18 0241 09/29/18 2306 09/30/18 0247 09/30/18 0820  NA 140 145  --  147* 150* 138  --  138  K 3.3* 3.0*  --  3.1* 3.2* 2.9*  --  3.4*  CL 107 115*  --  119* 119* 111  --  109  CO2 19* 18*  --  18* 22 22  --  21*  GLUCOSE 157* 133*  --  139* 134* 111*  --  117*  BUN 17 30*  --  39* 31* 28*  --  20  CREATININE 0.72 0.77  --  0.73 0.69 0.63  --  0.62  CALCIUM 9.3 9.2  --  9.2 8.8* 8.3*  --  8.6*  MG 1.8 2.0  --  2.3 2.1  --  1.9  --   PHOS  --   --  3.2 3.5 3.6  --  3.3  --    GFR: Estimated Creatinine Clearance: 70 mL/min (by C-G formula based on SCr of 0.62 mg/dL). Recent Labs  Lab 09/25/18 0010  09/27/18 0528 09/27/18 1102 09/27/18 1401 09/28/18 0252 09/29/18 0241 09/30/18 0247  WBC  --    < > 9.9  --   --  7.8 9.0 8.8  LATICACIDVEN 1.4  --   --  1.1 0.9  --   --   --    < > = values in this interval not displayed.    Liver Function Tests: Recent Labs  Lab 09/26/18 0456 09/27/18 0528 09/28/18 0252 09/29/18 0241 09/30/18 0820  AST 160* 104* 91* 113* 73*  ALT 61* 65* 67* 86* 71*  ALKPHOS 119 113 95 92 81  BILITOT 3.7* 2.2* 1.9* 1.7* 1.9*  PROT 6.8 6.9 6.4* 6.2* 5.5*  ALBUMIN 3.9 3.8 3.6 3.4* 3.0*   Recent Labs  Lab 09/24/18 2058 09/26/18 0456 09/30/18 0247 09/30/18 0820  LIPASE 23 46 18  --   AMYLASE  --   --   --  41   Recent Labs  Lab 09/27/18 1102 09/30/18 0247  AMMONIA 30 34    ABG No results found for: PHART, PCO2ART, PO2ART, HCO3, TCO2, ACIDBASEDEF, O2SAT   Coagulation Profile: No results for input(s): INR, PROTIME in the last 168 hours.  Cardiac Enzymes: Recent Labs  Lab 09/25/18 0555  TROPONINI 0.04*    HbA1C: No results found for: HGBA1C  CBG: No results for input(s): GLUCAP in the last 168 hours.   Assessment & Plan:   Agitated Delirium  -in setting of chronic benzo's, acute ascending cholangitis  -ammonia  30 P: Wean precedex / fentanyl gtt to off, ceiling reduced with up titration of PO's  Increase PO oxycodone to 30 mg Q6 > if tolerated overnight, resume home PO's in am  Continue home trazodone   Acute Ascending Cholangitis s/p ERCP & Sphincterotomy P: CCS following, no plans for surgical intervention at this time  Monitor off abx  Recommendations per CCS / GI  SLP > cleared pt for D1 diet, nectar thick liquids  Chronic Pain  -high dose use at home P: Plan as above  Discontinue CO2 monitoring as is agitating patient   Questionable AF  P: Tele monitoring, doubt AF > feel related to significant agitation    HTN  P: ICU monitoring > likely will be able to transfer out in am  PRN hydralazine   BPH  P: Resume home flomax  Consider d/c foley catheter once medications on board for 24/48 hours   Electrolyte Disturbance: Hyponatremia, Hypokalemia  P: Monitor, replace as indicated    Best practice:  Diet: D1 diet Pain/Anxiety/Delirium protocol (if indicated): precedex and fentanyl VAP protocol (if indicated): n/a DVT prophylaxis: UFH GI prophylaxis: N/a Glucose control: monitor Mobility: bedrest  Code Status: Full Family Communication: Family updated 2/19 at bedside  Disposition: ICU   Critical care time: 30 minutes     Noe Gens, NP-C [facility] Pgr: [phone] or if no answer [phone] 09/30/2018, 3:13 PM

## 2018-09-30 NOTE — Progress Notes (Signed)
[facility] Progress Note Patient Name: Riley Eaton DOB: Feb 02, 1941 MRN: [id number]   Date of Service  09/30/2018  HPI/Events of Note  Frequent PAC's and PVC's. K+ = 2.9.  eICU Interventions  Will order: 1. Replace Mg++ 2. Mg++ level STAT.      Intervention Category Major Interventions: Arrhythmia - evaluation and management  Sommer,Steven Eugene 09/30/2018, 1:00 AM

## 2018-09-30 NOTE — Progress Notes (Signed)
  Speech Language Pathology Treatment: Dysphagia  Patient Details Name: Riley Eaton MRN: [id number] DOB: Mar 19, 1941 Today's Date: 09/30/2018 Time: 9532-0233 SLP Time Calculation (min) (ACUTE ONLY): 24 min  Assessment / Plan / Recommendation Clinical Impression  Pt shows mild improvements in mentation since previous date, also now out of restraints and participating more in self-feeding. He has slow oral transit and impulsive rate of intake, taking small bites at a time but putting additional bites into his mouth before he has swallowed what is already in his mouth. Coughing follows, which is concerning for premature spillage. With Mod cues and SLP assist for pacing, he has no further coughing with purees. Intermittent coughing is observed with thin liquids but not with nectar thick liquids, even when consumed in larger volumes. Recommend initiating Dys 1 diet and nectar thick liquids with full supervision for implementation of safe swallow strategies - particularly swallowing before taking his next bite/sip.   HPI HPI: Pt is a 78 yo male admitted with AMS and ascending cholangitis from obstructing CBD stone s/p ERCP and sphincterotomy. CXR on admission showed large hiatal hernia but no active disease. PMH: chronic back pain      SLP Plan  Continue with current plan of care       Recommendations  Diet recommendations: Dysphagia 1 (puree);Nectar-thick liquid Liquids provided via: Straw Medication Administration: Crushed with puree Supervision: Staff to assist with self feeding;Full supervision/cueing for compensatory strategies Compensations: Slow rate;Small sips/bites Postural Changes and/or Swallow Maneuvers: Seated upright 90 degrees;Upright 30-60 min after meal                Oral Care Recommendations: Oral care BID Follow up Recommendations: (tba) SLP Visit Diagnosis: Dysphagia, unspecified (R13.10) Plan: Continue with current plan of care       GO                 Venita Sheffield Jenene Kauffmann 09/30/2018, 8:57 AM  Germain Osgood Blayde Bacigalupi, M.A. [place] Acute Environmental education officer [phone] Office [phone]

## 2018-10-01 DIAGNOSIS — R112 Nausea with vomiting, unspecified: Secondary | ICD-10-CM

## 2018-10-01 DIAGNOSIS — G894 Chronic pain syndrome: Secondary | ICD-10-CM

## 2018-10-01 LAB — COMPREHENSIVE METABOLIC PANEL
ALT: 61 U/L — ABNORMAL HIGH (ref 0–44)
AST: 54 U/L — ABNORMAL HIGH (ref 15–41)
Albumin: 2.8 g/dL — ABNORMAL LOW (ref 3.5–5.0)
Alkaline Phosphatase: 81 U/L (ref 38–126)
Anion gap: 9 (ref 5–15)
BUN: 19 mg/dL (ref 8–23)
CO2: 21 mmol/L — ABNORMAL LOW (ref 22–32)
Calcium: 8.2 mg/dL — ABNORMAL LOW (ref 8.9–10.3)
Chloride: 107 mmol/L (ref 98–111)
Creatinine, Ser: 0.59 mg/dL — ABNORMAL LOW (ref 0.61–1.24)
GFR calc Af Amer: >60 mL/min (ref >60)
GFR calc non Af Amer: >60 mL/min (ref >60)
Glucose, Bld: 128 mg/dL — ABNORMAL HIGH (ref 70–99)
Potassium: 3 mmol/L — ABNORMAL LOW (ref 3.5–5.1)
Sodium: 137 mmol/L (ref 135–145)
Total Bilirubin: 1.2 mg/dL (ref 0.3–1.2)
Total Protein: 5.1 g/dL — ABNORMAL LOW (ref 6.5–8.1)

## 2018-10-01 LAB — CBC
HCT: 31.9 % — ABNORMAL LOW (ref 39.0–52.0)
Hemoglobin: 11.2 g/dL — ABNORMAL LOW (ref 13.0–17.0)
MCH: 31.4 pg (ref 26.0–34.0)
MCHC: 35.1 g/dL (ref 30.0–36.0)
MCV: 89.4 fL (ref 80.0–100.0)
Platelets: 137 10*3/uL — ABNORMAL LOW (ref 150–400)
RBC: 3.57 MIL/uL — ABNORMAL LOW (ref 4.22–5.81)
RDW: 13.2 % (ref 11.5–15.5)
WBC: 5.9 10*3/uL (ref 4.0–10.5)
nRBC: 0 % (ref 0.0–0.2)

## 2018-10-01 LAB — MAGNESIUM: Magnesium: 2 mg/dL (ref 1.7–2.4)

## 2018-10-01 LAB — PHOSPHORUS: Phosphorus: 3.2 mg/dL (ref 2.5–4.6)

## 2018-10-01 MED ORDER — ADULT MULTIVITAMIN W/MINERALS CH
1.0000 | ORAL_TABLET | Freq: Every day | ORAL | Status: DC
  Administered 2018-10-02 – 2018-10-06 (×5): 1 via ORAL
  Filled 2018-10-01 (×5): qty 1

## 2018-10-01 MED ORDER — POTASSIUM CHLORIDE CRYS ER 20 MEQ PO TBCR: 20.0000 meq | EXTENDED_RELEASE_TABLET | ORAL | Status: DC

## 2018-10-01 MED ORDER — PROMETHAZINE HCL 25 MG PO TABS
25.0000 mg | ORAL_TABLET | ORAL | Status: DC | PRN
  Administered 2018-10-01 – 2018-10-04 (×4): 25 mg via ORAL
  Filled 2018-10-01 (×5): qty 1

## 2018-10-01 MED ORDER — POTASSIUM CHLORIDE CRYS ER 20 MEQ PO TBCR
40.0000 meq | EXTENDED_RELEASE_TABLET | Freq: Once | ORAL | Status: AC
  Administered 2018-10-01: 40 meq via ORAL
  Filled 2018-10-01: qty 2

## 2018-10-01 MED ORDER — OXYCODONE HCL 5 MG PO TABS
30.0000 mg | ORAL_TABLET | Freq: Once | ORAL | Status: AC
  Administered 2018-10-01: 30 mg via ORAL
  Filled 2018-10-01: qty 6

## 2018-10-01 MED ORDER — INFLUENZA VAC SPLIT HIGH-DOSE 0.5 ML IM SUSY
0.5000 mL | PREFILLED_SYRINGE | INTRAMUSCULAR | Status: AC
  Administered 2018-10-02: 0.5 mL via INTRAMUSCULAR
  Filled 2018-10-01 (×2): qty 0.5

## 2018-10-01 MED ORDER — POTASSIUM PHOSPHATES 15 MMOLE/5ML IV SOLN
20.0000 mmol | Freq: Once | INTRAVENOUS | Status: DC
  Administered 2018-10-01: 20 mmol via INTRAVENOUS
  Filled 2018-10-01: qty 6.67

## 2018-10-01 NOTE — Progress Notes (Signed)
  Speech Language Pathology Treatment: Dysphagia  Patient Details Name: Riley Eaton MRN: [id number] DOB: 03-05-1941 Today's Date: 10/01/2018 Time: 6269-4854 SLP Time Calculation (min) (ACUTE ONLY): 12 min  Assessment / Plan / Recommendation Clinical Impression  Pt declined most POs offered due to reported nausea (RN had just given zofran) but skilled observation was provided as pt consumed meds whole with nectar thick liquids. Immediate cough was noted x2 after consuming capsules, not while consuming tablets, but oral clearance of pills was good given supervision. Could consider offering them whole but would still provide in purees. Education was provided about the rationale for PO trials and the potential to advance diet, but pt still declined. Will continue current diet with additional SLP f/u to advance.   HPI HPI: Pt is a 78 yo male admitted with AMS and ascending cholangitis from obstructing CBD stone s/p ERCP and sphincterotomy. CXR on admission showed large hiatal hernia but no active disease. PMH: chronic back pain      SLP Plan  Continue with current plan of care       Recommendations  Diet recommendations: Dysphagia 1 (puree);Nectar-thick liquid Liquids provided via: Straw;Cup Medication Administration: Whole meds with puree Supervision: Staff to assist with self feeding;Full supervision/cueing for compensatory strategies Compensations: Slow rate;Small sips/bites Postural Changes and/or Swallow Maneuvers: Seated upright 90 degrees;Upright 30-60 min after meal                Oral Care Recommendations: Oral care BID Follow up Recommendations: (tba) SLP Visit Diagnosis: Dysphagia, unspecified (R13.10) Plan: Continue with current plan of care       GO                Venita Sheffield Lourine Alberico 10/01/2018, 10:13 AM  Nuala Alpha, M.A. [place] Acute Environmental education officer [phone] Office [phone]

## 2018-10-01 NOTE — Progress Notes (Signed)
[facility] ADULT ICU REPLACEMENT PROTOCOL FOR AM LAB REPLACEMENT ONLY  The patient does not apply for the [facility] Adult ICU Electrolyte Replacment Protocol based on the criteria listed below:   Is urine output >/= 0.5 ml/kg/hr for the last 6 hours? No.   Abnormal electrolyte(s): K3.0   If a panic level lab has been reported, has the CCM MD in charge been notified? Yes.  .   Physician:  Jake Samples  MD  Vear Clock 10/01/2018 6:46 AM

## 2018-10-01 NOTE — Progress Notes (Signed)
Wasted 100 ml of fentanyl IV with Sharyn Dross Rn.

## 2018-10-01 NOTE — Progress Notes (Addendum)
NAME:  Riley Eaton, MRN:  [id number], DOB:  Nov 23, 1940, LOS: 6 ADMISSION DATE:  09/24/2018, CONSULTATION DATE:  09/27/2018 REFERRING MD:  Candiss Norse, CHIEF COMPLAINT:  Confusion.   HPI  78 year old man who is being admitted to the ICU for severe delirium requiring Precedex.    He has been confused since admission on 2/13 with ascending cholangitis from obstructing CBD stone removed by ERCP.  Despite resolution of cholangitis, patient remains delirious despite BZD and haloperidol. Wife reports no drug use apart form high dose narcotics for back pain.  Past Medical History  Chronic back pain  Chronic opioid use  Thoracic aortic aneurysm     Interim history/subjective:  Weaned off fentanyl and precedex. No complaints this am    Objective   Blood pressure 104/79, pulse (!) 111, temperature 99.6 F (37.6 C), temperature source Axillary, resp. rate (!) 22, height 5\' 7"  (1.702 m), weight 71.7 kg, SpO2 100 %.        Intake/Output Summary (Last 24 hours) at 10/01/2018 1931 Last data filed at 10/01/2018 1700 Gross per 24 hour  Intake 639.5 ml  Output 1500 ml  Net -860.5 ml   Filed Weights   09/25/18 0500  Weight: 71.7 kg    Physical Exam: General: Well-appearing, no acute distress HENT: [place], AT, OP clear, MMM Eyes: EOMI, no scleral icterus Respiratory: Clear to auscultation bilaterally.  No crackles, wheezing or rales Cardiovascular: RRR, -M/R/G, no JVD GI: BS+, soft, nontender Extremities:-Edema,-tenderness Neuro: AAO x4, CNII-XII grossly intact Skin: Intact, no rashes or bruising Psych: Normal mood, normal affect  Ancillary tests     CBC: Recent Labs  Lab 09/25/18 0555  09/27/18 0528 09/28/18 0252 09/29/18 0241 09/30/18 0247 10/01/18 0254  WBC 11.5*   < > 9.9 7.8 9.0 8.8 5.9  NEUTROABS 9.5*  --   --   --   --   --   --   HGB 13.6   < > 13.2 12.4* 12.6* 12.3* 11.2*  HCT 38.6*   < > 37.1* 35.4* 36.4* 34.9* 31.9*  MCV 89.8   < > 88.1 89.8 91.0 90.2 89.4  PLT 142*    < > 124* 124* 127* 115* 137*   < > = values in this interval not displayed.    Basic Metabolic Panel: Recent Labs  Lab 09/27/18 0528 09/27/18 1102 09/28/18 0252 09/29/18 0241 09/29/18 2306 09/30/18 0247 09/30/18 0820 10/01/18 0254  NA 145  --  147* 150* 138  --  138 137  K 3.0*  --  3.1* 3.2* 2.9*  --  3.4* 3.0*  CL 115*  --  119* 119* 111  --  109 107  CO2 18*  --  18* 22 22  --  21* 21*  GLUCOSE 133*  --  139* 134* 111*  --  117* 128*  BUN 30*  --  39* 31* 28*  --  20 19  CREATININE 0.77  --  0.73 0.69 0.63  --  0.62 0.59*  CALCIUM 9.2  --  9.2 8.8* 8.3*  --  8.6* 8.2*  MG 2.0  --  2.3 2.1  --  1.9  --  2.0  PHOS  --  3.2 3.5 3.6  --  3.3  --  3.2   GFR: Estimated Creatinine Clearance: 70 mL/min (A) (by C-G formula based on SCr of 0.59 mg/dL (L)). Recent Labs  Lab 09/25/18 0010  09/27/18 1102 09/27/18 1401 09/28/18 0252 09/29/18 0241 09/30/18 0247 10/01/18 0254  WBC  --    < >  --   --  7.8 9.0 8.8 5.9  LATICACIDVEN 1.4  --  1.1 0.9  --   --   --   --    < > = values in this interval not displayed.    Liver Function Tests: Recent Labs  Lab 09/27/18 0528 09/28/18 0252 09/29/18 0241 09/30/18 0820 10/01/18 0254  AST 104* 91* 113* 73* 54*  ALT 65* 67* 86* 71* 61*  ALKPHOS 113 95 92 81 81  BILITOT 2.2* 1.9* 1.7* 1.9* 1.2  PROT 6.9 6.4* 6.2* 5.5* 5.1*  ALBUMIN 3.8 3.6 3.4* 3.0* 2.8*   Recent Labs  Lab 09/24/18 2058 09/26/18 0456 09/30/18 0247 09/30/18 0820  LIPASE 23 46 18  --   AMYLASE  --   --   --  41   Recent Labs  Lab 09/27/18 1102 09/30/18 0247  AMMONIA 30 34    ABG No results found for: PHART, PCO2ART, PO2ART, HCO3, TCO2, ACIDBASEDEF, O2SAT   Coagulation Profile: No results for input(s): INR, PROTIME in the last 168 hours.  Cardiac Enzymes: Recent Labs  Lab 09/25/18 0555  TROPONINI 0.04*    HbA1C: No results found for: HGBA1C  CBG: No results for input(s): GLUCAP in the last 168 hours.   Assessment & Plan:   Agitated  Delirium  -in setting of chronic benzo's, acute ascending cholangitis  -ammonia 30 P: Weaned off precedex / fentanyl gtt this morning On PO pain medications at reduced dose. Will need to titrate to home dose when able Continue home trazodone   Acute Ascending Cholangitis s/p ERCP & Sphincterotomy P: CCS following, no plans for surgical intervention at this time  Monitor off abx  Recommendations per CCS / GI  SLP > cleared pt for D1 diet, nectar thick liquids  Chronic Pain  -high dose use at home P: Management as above  Nausea and emesis P Restarted home phenergan  Questionable AF  P: Tele monitoring, doubt AF > feel related to significant agitation    HTN  P: ICU monitoring > likely will be able to transfer out in am  PRN hydralazine   BPH  P: Resume home flomax  Consider d/c foley catheter once medications on board for 24/48 hours   Electrolyte Disturbance: Hyponatremia, Hypokalemia  P: Monitor, replace as indicated    Best practice:  Diet: D1 diet Pain/Anxiety/Delirium protocol (if indicated):  VAP protocol (if indicated): n/a DVT prophylaxis: UFH GI prophylaxis: N/a Glucose control: monitor Mobility: bedrest  Code Status: Full Family Communication: No family at bedside Disposition: Transfer to [facility] 2/21  Rodman Pickle, M.D. [facility] Medicine Pager: [phone] After hours pager: [phone]

## 2018-10-01 NOTE — Progress Notes (Signed)
eLink Physician-Brief Progress Note Patient Name: NELTON AMSDEN DOB: Nov 23, 1940 MRN: [id number]   Date of Service  10/01/2018  HPI/Events of Note  K 3, Cr normal.  Low phosphate  eICU Interventions  Kphos 20 mmols IV, Kcl 40 meq once oral.      Intervention Category Intermediate Interventions: Electrolyte abnormality - evaluation and management  Elmer Sow 10/01/2018, 6:59 AM

## 2018-10-01 NOTE — Progress Notes (Signed)
To Progressive Care with sitter.  To [facility] as of 2/21 am.  Spoke with Dr. Benny Lennert for sign out.   Noe Gens, NP-C [place] [facility] Pgr: [phone] or if no answer [phone] 10/01/2018, 4:59 PM

## 2018-10-01 NOTE — Plan of Care (Signed)

## 2018-10-01 NOTE — Progress Notes (Signed)
Nutrition Follow-up  DOCUMENTATION CODES:   Severe malnutrition in context of acute illness/injury  INTERVENTION:   - Vital Cuisine Shake TID with meals, each supplement provides 520 kcal and 22 grams of protein  - MVI with minerals daily  - Follow for diet advancement and supplement as appropriate  NUTRITION DIAGNOSIS:   Severe Malnutrition related to acute illness (ascending cholangitis from obstructing CBD stone) as evidenced by moderate fat depletion, moderate muscle depletion, severe muscle depletion.  Ongoing  GOAL:   Patient will meet greater than or equal to 90% of their needs  Progressing  MONITOR:   PO intake, Supplement acceptance, Diet advancement, Labs, Weight trends  REASON FOR ASSESSMENT:   Consult Enteral/tube feeding initiation and management  ASSESSMENT:   78 year old male who presented to the ED on 2/13 with chest pain and agitation. PMH significant for chronic back pain. Work-up revealed gallstones and mild elevation of bilirubin. Pt is s/p ERCP, pancreatic stent placement, removal of stones, sphincterotomy on 2/14.  Pt's diet advanced yesterday to Dysphagia 1 with nectar-thick liquids. Per flowsheets, pt consumed 30% of lunch meal yesterday.  Spoke with pt at bedside. Pt states he feels nauseous and has been "throwing up all morning." Pt states that he has been given some medication to help his nausea but that it does not seem to be working. Pt states that he did not eat at breakfast this morning. RD encouraged PO intake to aid in healing and regaining strength.  Discussed pt with RN who confirms that pt has experienced emesis today. Per RN, pt begins coughing a lot then has some emesis. RN states she has provided Zofran to pt. RN reports Fentanyl drip off. Pt to transfer out of the ICU today.  RD to order Vital Cuisine shakes TID with meals to aid in meeting kcal and protein needs. Will also order daily MVI with minerals given poor PO  intake.  Medications reviewed and include: potassium phosphate 20 mmol once  Labs reviewed: potassium 3.0 (L) - being repleted, elevated LFTs  UOP: 925 ml x 24 hours  Diet Order:   Diet Order            DIET - DYS 1 Room service appropriate? Yes; Fluid consistency: Nectar Thick  Diet effective now              EDUCATION NEEDS:   Not appropriate for education at this time  Skin:  Skin Assessment: Reviewed RN Assessment  Last BM:  2/19 large type 6  Height:   Ht Readings from Last 1 Encounters:  09/25/18 5\' 7"  (1.702 m)    Weight:   Wt Readings from Last 1 Encounters:  09/25/18 71.7 kg    Ideal Body Weight:  67.3 kg  BMI:  Body mass index is 24.75 kg/m.  Estimated Nutritional Needs:   Kcal:  1800-2000  Protein:  85-100 grams  Fluid:  1.8-2.0 L    Gaynell Face, MS, RD, LDN Inpatient Clinical Dietitian Pager: [phone] Weekend/After Hours: [phone]

## 2018-10-01 NOTE — Plan of Care (Signed)
  Problem: Education: Goal: Knowledge of General Education information will improve Description Including pain rating scale, medication(s)/side effects and non-pharmacologic comfort measures Outcome: Progressing   Problem: Health Behavior/Discharge Planning: Goal: Ability to manage health-related needs will improve Outcome: Progressing   Problem: Clinical Measurements: Goal: Ability to maintain clinical measurements within normal limits will improve Outcome: Progressing Goal: Will remain free from infection Outcome: Progressing Goal: Diagnostic test results will improve Outcome: Progressing Goal: Respiratory complications will improve Outcome: Progressing Goal: Cardiovascular complication will be avoided Outcome: Progressing   Problem: Activity: Goal: Risk for activity intolerance will decrease Outcome: Progressing   Problem: Nutrition: Goal: Adequate nutrition will be maintained Outcome: Progressing   Problem: Coping: Goal: Level of anxiety will decrease Outcome: Progressing   Problem: Elimination: Goal: Will not experience complications related to bowel motility Outcome: Progressing   Problem: Pain Managment: Goal: General experience of comfort will improve Outcome: Progressing   Problem: Safety: Goal: Ability to remain free from injury will improve Outcome: Progressing   Problem: Clinical Measurements: Goal: Ability to maintain clinical measurements within normal limits will improve Outcome: Progressing Goal: Postoperative complications will be avoided or minimized Outcome: Progressing   Problem: Skin Integrity: Goal: Demonstration of wound healing without infection will improve Outcome: Progressing   Problem: Elimination: Goal: Will not experience complications related to urinary retention 10/01/2018 0657 by Algernon Huxley, RN Outcome: Not Progressing Foley placed for retention

## 2018-10-02 LAB — PHOSPHORUS: Phosphorus: 3 mg/dL (ref 2.5–4.6)

## 2018-10-02 LAB — BASIC METABOLIC PANEL
Anion gap: 11 (ref 5–15)
BUN: 13 mg/dL (ref 8–23)
CO2: 25 mmol/L (ref 22–32)
Calcium: 8.7 mg/dL — ABNORMAL LOW (ref 8.9–10.3)
Chloride: 103 mmol/L (ref 98–111)
Creatinine, Ser: 0.7 mg/dL (ref 0.61–1.24)
GFR calc Af Amer: >60 mL/min (ref >60)
GFR calc non Af Amer: >60 mL/min (ref >60)
Glucose, Bld: 139 mg/dL — ABNORMAL HIGH (ref 70–99)
Potassium: 3.1 mmol/L — ABNORMAL LOW (ref 3.5–5.1)
Sodium: 139 mmol/L (ref 135–145)

## 2018-10-02 LAB — MAGNESIUM: Magnesium: 2 mg/dL (ref 1.7–2.4)

## 2018-10-02 MED ORDER — PREGABALIN 100 MG PO CAPS
100.0000 mg | ORAL_CAPSULE | Freq: Three times a day (TID) | ORAL | Status: DC
  Administered 2018-10-02 – 2018-10-06 (×13): 100 mg via ORAL
  Filled 2018-10-02 (×13): qty 1

## 2018-10-02 MED ORDER — POTASSIUM CHLORIDE CRYS ER 20 MEQ PO TBCR
40.0000 meq | EXTENDED_RELEASE_TABLET | ORAL | Status: AC
  Administered 2018-10-02 (×2): 40 meq via ORAL
  Filled 2018-10-02 (×2): qty 2

## 2018-10-02 MED ORDER — HALOPERIDOL LACTATE 5 MG/ML IJ SOLN
2.0000 mg | Freq: Four times a day (QID) | INTRAMUSCULAR | Status: DC | PRN
  Administered 2018-10-02: 2 mg via INTRAVENOUS
  Filled 2018-10-02: qty 1

## 2018-10-02 NOTE — Progress Notes (Signed)
Pt verbally aggressive with staff and physically aggressive with staff (grabbing arms). Pt redirected.

## 2018-10-02 NOTE — Evaluation (Signed)
Physical Therapy Evaluation Patient Details Name: Riley Eaton MRN: [id number] DOB: 1941-05-18 Today's Date: 10/02/2018   History of Present Illness  Patient is a 78 y/o male who presents with abdominal pain, agitation and CP. Found to have acute choleycytitis s/p ERCP and removal of stone 2/14. Also noted to have acute delirium secondary to narcotic withdrawal. PMH includes thoracic aortic aneurysm and chronic back pain.  Clinical Impression  Patient presents with confusion, impaired attention, impulsivity, impaired balance, poor safety awareness and impaired mobility s/p above. Tolerated transfers and gait training with Min A for balance/asfety. Pt needs constant redirection to attend to task. Pt Mod I PTA using SPC for ambulation and performing ADls/IADLs without assist. No falls per wife. HR ranged from 110-143 bpm throughout needing 1 seated rest break due to 2/4 DOE and fatigue. Pt with no awareness of safety and easy distracted putting him at increased risk for falls. Wife not able to care for pt in this state. Would benefit from SNF to maximize independence and mobility prior to return home. Will follow acutely.    Follow Up Recommendations SNF;Supervision for mobility/OOB    Equipment Recommendations  None recommended by PT    Recommendations for Other Services       Precautions / Restrictions Precautions Precaution Comments: watch HR Restrictions Weight Bearing Restrictions: No      Mobility  Bed Mobility Overal bed mobility: Needs Assistance Bed Mobility: Supine to Sit     Supine to sit: Supervision;HOB elevated     General bed mobility comments: Able to get to EOB without assist, impulsive.   Transfers Overall transfer level: Needs assistance Equipment used: Rolling walker (2 wheeled) Transfers: Sit to/from Stand Sit to Stand: Min assist         General transfer comment: Assist to power to standing with cues for hand placement, impulsive. Stood from [employer], from chair x2.   Ambulation/Gait Ambulation/Gait assistance: Min assist Gait Distance (Feet): 50 Feet(+ 50') Assistive device: Rolling walker (2 wheeled) Gait Pattern/deviations: Step-through pattern;Decreased stride length;Trunk flexed;Shuffle Gait velocity: decreased   General Gait Details: Slow, unsteady gait with forward flexed posture, cues for RW proximity and upright. 1 seated rest break. HR ranged from 112-143 bpm. Difficult to get accurate Sp02 due to poor wave length. likely mid 80s-90s on RA.   Stairs            Wheelchair Mobility    Modified Rankin (Stroke Patients Only)       Balance Overall balance assessment: Needs assistance Sitting-balance support: Feet supported;No upper extremity supported Sitting balance-Leahy Scale: Good     Standing balance support: During functional activity Standing balance-Leahy Scale: Poor Standing balance comment: Reaching for ground to grab lunch box and packing things up on counter, Min A for balance and no awareness of deficits.                              Pertinent Vitals/Pain Pain Assessment: Faces Faces Pain Scale: No hurt    Home Living Family/patient expects to be discharged to:: Skilled nursing facility Living Arrangements: Spouse/significant other Available Help at Discharge: Family;Available 24 hours/day Type of Home: House Home Access: Stairs to enter Entrance Stairs-Rails: Right Entrance Stairs-Number of Steps: 4 Home Layout: One level Home Equipment: Walker - 2 wheels;Cane - single point;Shower seat      Prior Function Level of Independence: Independent with assistive device(s)         Comments:  pt uses SPC for ambulation, cooks some and drives PTA. No falls reports per wife     Hand Dominance        Extremity/Trunk Assessment   Upper Extremity Assessment Upper Extremity Assessment: Defer to OT evaluation    Lower Extremity Assessment Lower Extremity Assessment:  Generalized weakness    Cervical / Trunk Assessment Cervical / Trunk Assessment: Kyphotic  Communication   Communication: No difficulties  Cognition Arousal/Alertness: Awake/alert Behavior During Therapy: Restless;Impulsive Overall Cognitive Status: Impaired/Different from baseline Area of Impairment: Orientation;Attention;Memory;Following commands;Safety/judgement;Awareness                 Orientation Level: Disoriented to;Situation Current Attention Level: Selective Memory: Decreased short-term memory Following Commands: Follows one step commands inconsistently;Follows one step commands with increased time Safety/Judgement: Decreased awareness of safety;Decreased awareness of deficits Awareness: Intellectual   General Comments: Pt distracted but able to be redirected. Perseverating about wanting to go home and leave.      General Comments General comments (skin integrity, edema, etc.): Wife present during session. Provided PLOF/history.     Exercises     Assessment/Plan    PT Assessment Patient needs continued PT services  PT Problem List Decreased strength;Decreased mobility;Decreased safety awareness;Cardiopulmonary status limiting activity;Decreased balance;Decreased knowledge of use of DME;Decreased cognition;Decreased activity tolerance       PT Treatment Interventions Functional mobility training;Balance training;Patient/family education;Gait training;Therapeutic activities;Therapeutic exercise;DME instruction    PT Goals (Current goals can be found in the Care Plan section)  Acute Rehab PT Goals Patient Stated Goal: to get into rehab per wife, pt wants to go home now PT Goal Formulation: With patient/family Time For Goal Achievement: 10/16/18 Potential to Achieve Goals: Good    Frequency Min 2X/week   Barriers to discharge Decreased caregiver support;Inaccessible home environment      Co-evaluation               AM-PAC PT "6 Clicks" Mobility   Outcome Measure Help needed turning from your back to your side while in a flat bed without using bedrails?: A Little Help needed moving from lying on your back to sitting on the side of a flat bed without using bedrails?: A Little Help needed moving to and from a bed to a chair (including a wheelchair)?: A Little Help needed standing up from a chair using your arms (e.g., wheelchair or bedside chair)?: A Little Help needed to walk in hospital room?: A Little Help needed climbing 3-5 steps with a railing? : A Lot 6 Click Score: 17    End of Session Equipment Utilized During Treatment: Gait belt Activity Tolerance: Patient tolerated treatment well Patient left: in chair;with call bell/phone within reach;with chair alarm set;with nursing/sitter in room;with family/visitor present Nurse Communication: Mobility status PT Visit Diagnosis: Unsteadiness on feet (R26.81);Difficulty in walking, not elsewhere classified (R26.2);Muscle weakness (generalized) (M62.81)    Time: 5449-2010 PT Time Calculation (min) (ACUTE ONLY): 26 min   Charges:   PT Evaluation $PT Eval Moderate Complexity: 1 Mod PT Treatments $Gait Training: 8-22 mins        Wray Kearns, [place], DPT Acute Rehabilitation Services Pager [phone] Office [place] 10/02/2018, 2:35 PM

## 2018-10-02 NOTE — Progress Notes (Addendum)
[facility] Progress Note Patient Name: Riley Eaton DOB: 1941/04/08 MRN: [id number]   Date of Service  10/02/2018  HPI/Events of Note  Bedside RN requested for telemonitor.  Pt is oriented to self but apparently confused.    eICU Interventions  Telemonitor ordered.       Intervention Category Minor Interventions: Other:  Elsie Lincoln 10/02/2018, 12:17 AM   3:16 AM  1:1 sitter ordered for the patient as the telemonitor was not sufficient for the patient.

## 2018-10-02 NOTE — Progress Notes (Signed)
Patient became  Very anxious and restless climbed over rails to bedside chair. Continues to pull on lines. Not responsive to tele-sitter redirection. MD paged 1:1 sitter in place.

## 2018-10-02 NOTE — Progress Notes (Signed)
  Speech Language Pathology Treatment: Dysphagia  Patient Details Name: Riley Eaton MRN: [id number] DOB: [birth year] Today's Date: 10/02/2018 Time: 1000-1018 SLP Time Calculation (min) (ACUTE ONLY): 18 min  Assessment / Plan / Recommendation Clinical Impression  Pt quite agitated upon SLP arrival, trying to climb out of bed, resisting staff efforts to keep him safe. Minimally responsive to redirection. He refused to eat or drink am meal, but did eat two cookies with NT without difficulty. SLP able to give a few sips of lemonade and a cookie. Solids tolerated well, but potential for impulsive eating and drinking persist. Trials of thin liquids are followed by occasional cough or throat clear. Will upgrade diet to monitor for tolerance while admitted as pt is not capable of instrumental testing but has relatively low risk for aspiration.    HPI HPI: Pt is a 78 yo male admitted with AMS and ascending cholangitis from obstructing CBD stone s/p ERCP and sphincterotomy. CXR on admission showed large hiatal hernia but no active disease. PMH: chronic back pain      SLP Plan  Continue with current plan of care       Recommendations  Diet recommendations: Dysphagia 3 (mechanical soft);Thin liquid Liquids provided via: Straw;Cup Medication Administration: Whole meds with puree Supervision: Staff to assist with self feeding;Full supervision/cueing for compensatory strategies Compensations: Slow rate;Small sips/bites Postural Changes and/or Swallow Maneuvers: Seated upright 90 degrees;Upright 30-60 min after meal                Oral Care Recommendations: Oral care BID Follow up Recommendations: Skilled Nursing facility Plan: Continue with current plan of care       [place]in Arduini, MA [place] Pager [phone] Office [phone]   Lynann Beaver 10/02/2018, 10:25 AM

## 2018-10-02 NOTE — Progress Notes (Signed)
Patient is pulling on lines and attempting to get oob, restless and anxious. MD notified. Patient has tele-sitter.

## 2018-10-02 NOTE — Progress Notes (Signed)
Social worker page to speak with family.

## 2018-10-02 NOTE — Progress Notes (Signed)
Staff reports pt throwing items.

## 2018-10-02 NOTE — Progress Notes (Signed)
Pt agitated. Not following instructions. Squeezing staff hand hard. Speech therapist attempt to speak with pt as well. Inappropriate concentration and safety.

## 2018-10-02 NOTE — Progress Notes (Addendum)
PROGRESS NOTE    Riley Eaton   VQM:[id number]  DOB: 23-Nov-1940  DOA: 09/24/2018 PCP: Aletha Halim., PA-C   Brief Narrative:  Riley Eaton is a 78 y.o. 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 He was quite restless in the ED and required Haldol and subsequently needed a  Precedex infusion to control his agitation.    Subjective: He is alert and has no complaints of pain, nausea or vomiting    Assessment & Plan:   Principal Problem:   Choledocholithiasis with cholangitis - s/p ERCP/ sphincterotomy and stenting of pancreatic duct - quite stable now on regular diet - cholecystectomy planned for outpatient vs inpatient if delirium was to resolve- I will re consult general surgery today  Active Problems:     Chronic pain - continue Oxycodone 30 mg every 6 hrs- he takes 80 mg BID or Oxycontin at home along with Lyrica which is on hold- will resume Lyrica today at 100 mg TID instead of the 200 TID that he takes at home    Urinary retention - Flomax resumed- will give a voiding trial today  Dysphagia - advance diet as tolerated- currently requiring a D1 diet with nectar thick liquids    Constipation - resolved    Protein-calorie malnutrition, severe - continue dietary supplements   Time spent in minutes: 35  DVT prophylaxis: Heparin Code Status: full code Family Communication: no family at bedside Disposition Plan: to be determined Consultants:   PCCM  GI Procedures:   ERCP on 2/14 Antimicrobials:  Anti-infectives (From admission, onward)   Start     Dose/Rate Route Frequency Ordered Stop   09/25/18 1200  piperacillin-tazobactam (ZOSYN) IVPB 3.375 g  Status:  Discontinued     3.375 g 12.5 mL/hr over 240 Minutes Intravenous Every 8 hours 09/25/18 0502 09/28/18 1118   09/25/18 0515   piperacillin-tazobactam (ZOSYN) IVPB 3.375 g     3.375 g 100 mL/hr over 30 Minutes Intravenous  Once 09/25/18 0501 09/25/18 0844       Objective: Vitals:   10/02/18 0125 10/02/18 0200 10/02/18 0314 10/02/18 0728  BP: (!) 167/83 (!) 151/96 (!) 158/98 (!) 179/101  Pulse: 91 (!) 106 80 99  Resp: 16 20 19 18   Temp: 98 F (36.7 C)  98 F (36.7 C)   TempSrc: Oral  Oral   SpO2: 96% 98% 98% 99%  Weight:      Height:        Intake/Output Summary (Last 24 hours) at 10/02/2018 0850 Last data filed at 10/02/2018 9326 Gross per 24 hour  Intake 519.83 ml  Output 2350 ml  Net -1830.17 ml   Filed Weights   09/25/18 0500  Weight: 71.7 kg    Examination: General exam: Appears comfortable  HEENT: PERRLA, oral mucosa moist, no sclera icterus or thrush Respiratory system: Clear to auscultation. Respiratory effort normal. Cardiovascular system: S1 & S2 heard, RRR.   Gastrointestinal system: Abdomen soft, non-tender, nondistended. Normal bowel sounds. Central nervous system: Alert and oriented. No focal neurological deficits. Extremities: No cyanosis, clubbing or edema Skin: No rashes or ulcers Psychiatry:  Mood & affect appropriate.     Data Reviewed: I have personally reviewed following labs and imaging studies  CBC: Recent Labs  Lab 09/27/18 0528 09/28/18 0252 09/29/18 0241 09/30/18 0247 10/01/18 0254  WBC 9.9 7.8  9.0 8.8 5.9  HGB 13.2 12.4* 12.6* 12.3* 11.2*  HCT 37.1* 35.4* 36.4* 34.9* 31.9*  MCV 88.1 89.8 91.0 90.2 89.4  PLT 124* 124* 127* 115* 419*   Basic Metabolic Panel: Recent Labs  Lab 09/28/18 0252 09/29/18 0241 09/29/18 2306 09/30/18 0247 09/30/18 0820 10/01/18 0254 10/02/18 0259  NA 147* 150* 138  --  138 137 139  K 3.1* 3.2* 2.9*  --  3.4* 3.0* 3.1*  CL 119* 119* 111  --  109 107 103  CO2 18* 22 22  --  21* 21* 25  GLUCOSE 139* 134* 111*  --  117* 128* 139*  BUN 39* 31* 28*  --  20 19 13   CREATININE 0.73 0.69 0.63  --  0.62 0.59* 0.70  CALCIUM 9.2  8.8* 8.3*  --  8.6* 8.2* 8.7*  MG 2.3 2.1  --  1.9  --  2.0 2.0  PHOS 3.5 3.6  --  3.3  --  3.2 3.0   GFR: Estimated Creatinine Clearance: 70 mL/min (by C-G formula based on SCr of 0.7 mg/dL). Liver Function Tests: Recent Labs  Lab 09/27/18 0528 09/28/18 0252 09/29/18 0241 09/30/18 0820 10/01/18 0254  AST 104* 91* 113* 73* 54*  ALT 65* 67* 86* 71* 61*  ALKPHOS 113 95 92 81 81  BILITOT 2.2* 1.9* 1.7* 1.9* 1.2  PROT 6.9 6.4* 6.2* 5.5* 5.1*  ALBUMIN 3.8 3.6 3.4* 3.0* 2.8*   Recent Labs  Lab 09/26/18 0456 09/30/18 0247 09/30/18 0820  LIPASE 46 18  --   AMYLASE  --   --  41   Recent Labs  Lab 09/27/18 1102 09/30/18 0247  AMMONIA 30 34   Coagulation Profile: No results for input(s): INR, PROTIME in the last 168 hours. Cardiac Enzymes: No results for input(s): CKTOTAL, CKMB, CKMBINDEX, TROPONINI in the last 168 hours. BNP (last 3 results) No results for input(s): PROBNP in the last 8760 hours. HbA1C: No results for input(s): HGBA1C in the last 72 hours. CBG: No results for input(s): GLUCAP in the last 168 hours. Lipid Profile: No results for input(s): CHOL, HDL, LDLCALC, TRIG, CHOLHDL, LDLDIRECT in the last 72 hours. Thyroid Function Tests: No results for input(s): TSH, T4TOTAL, FREET4, T3FREE, THYROIDAB in the last 72 hours. Anemia Panel: No results for input(s): VITAMINB12, FOLATE, FERRITIN, TIBC, IRON, RETICCTPCT in the last 72 hours. Urine analysis:    Component Value Date/Time   COLORURINE YELLOW 09/25/2018 1514   APPEARANCEUR CLEAR 09/25/2018 1514   LABSPEC >1.046 (H) 09/25/2018 1514   PHURINE 6.0 09/25/2018 1514   GLUCOSEU NEGATIVE 09/25/2018 1514   HGBUR SMALL (A) 09/25/2018 1514   BILIRUBINUR NEGATIVE 09/25/2018 1514   KETONESUR 80 (A) 09/27/2018 1112   PROTEINUR NEGATIVE 09/25/2018 1514   NITRITE NEGATIVE 09/25/2018 1514   LEUKOCYTESUR NEGATIVE 09/25/2018 1514   Sepsis Labs: @LABRCNTIP (procalcitonin:4,lacticidven:4) ) Recent Results (from the  past 240 hour(s))  Surgical pcr screen     Status: None   Collection Time: 09/26/18  2:11 AM  Result Value Ref Range Status   MRSA, PCR NEGATIVE NEGATIVE Final   Staphylococcus aureus NEGATIVE NEGATIVE Final    Comment: (NOTE) The Xpert SA Assay (FDA approved for NASAL specimens in patients 67 years of age and older), is one component of a comprehensive surveillance program. It is not intended to diagnose infection nor to guide or monitor treatment. Performed at [facility], [place] [address] [place], [place] [address]          Radiology Studies:  No results found.    Scheduled Meds: . DULoxetine  60 mg Oral Daily  . heparin injection (subcutaneous)  5,000 Units Subcutaneous Q8H  . Influenza vac split quadrivalent PF  0.5 mL Intramuscular Tomorrow-1000  . multivitamin with minerals  1 tablet Oral Daily  . oxyCODONE  30 mg Oral Q6H  . potassium chloride  40 mEq Oral Q4H  . tamsulosin  0.4 mg Oral Daily   Continuous Infusions:   LOS: 7 days      Debbe Odea, MD [facility] Pager: [email or web address] Password [facility] 10/02/2018, 8:50 AM

## 2018-10-03 LAB — MAGNESIUM: Magnesium: 2.1 mg/dL (ref 1.7–2.4)

## 2018-10-03 LAB — PHOSPHORUS: Phosphorus: 3.1 mg/dL (ref 2.5–4.6)

## 2018-10-03 NOTE — Clinical Social Work Note (Signed)
Clinical Social Work Assessment  Patient Details  Name: CLEVON KHADER MRN: [id number] Date of Birth: July 11, 1941  Date of referral:  10/03/18               Reason for consult:  Facility Placement                Permission sought to share information with:  Facility Sport and exercise psychologist, Family Supports Permission granted to share information::  Yes, Verbal Permission Granted  Name::     Terius, Jacuinde [phone]  [phone] or southard,michelle Other   [phone]   Agency::  SNF Admissions  Relationship::     Contact Information:     Housing/Transportation Living arrangements for the past 2 months:  Single Family Home Source of Information:  Patient, Adult Children Patient Interpreter Needed:  None Criminal Activity/Legal Involvement Pertinent to Current Situation/Hospitalization:  No - Comment as needed Significant Relationships:  Adult Children, Spouse Lives with:  Spouse Do you feel safe going back to the place where you live?  No Need for family participation in patient care:  No (Coment)  Care giving concerns:  Patient and family feel he needs some short term rehab before he is able to return back home.   Social Worker assessment / plan:  Patient is a 78 year old male who is married and lives with his wife.  Patient is alert and oriented x3.  CSW explained role of CSW and process for looking for placement for SNF.  Patient states he has never been at SNF before, CSW explained what to expect and how insurance will pay for stay.  Patient lives in [place], and would prefer to go to [facility] if possible.  CSW explained that is one of the facilities on the list and if they are not able to accept patient other options will have be be decided.  CSW informed them it may take a day or two for insurance approval.  Patient gave CSW permission to begin bed search in [facility], patient and family were very appreciative of information given, and they did not have any  other questions.  Employment status:  Retired Nurse, adult PT Recommendations:  [place] / Referral to community resources:  [place]  Patient/Family's Response to care:  Patient and family are agreeable to going to SNF for short term rehab.  Patient/Family's Understanding of and Emotional Response to Diagnosis, Current Treatment, and Prognosis:  Patient and family are hopeful that he will not have to be at [facility] for very long.  Emotional Assessment Appearance:  Appears stated age Attitude/Demeanor/Rapport:    Affect (typically observed):  Appropriate, Pleasant, Stable Orientation:  Oriented to Self, Oriented to Place, Oriented to  Time, Oriented to Situation Alcohol / Substance use:  Not Applicable Psych involvement (Current and /or in the community):  No (Comment)  Discharge Needs  Concerns to be addressed:  Care Coordination, Lack of Support Readmission within the last 30 days:  No Current discharge risk:  Lack of support system Barriers to Discharge:  Ship broker, Continued Medical Work up   [facility], LCSW 10/03/2018, 3:22 PM

## 2018-10-03 NOTE — Progress Notes (Signed)
PROGRESS NOTE    Riley Eaton   KNL:[id number]  DOB: 06/27/1941  DOA: 09/24/2018 PCP: Aletha Halim., PA-C   Brief Narrative:  Riley Eaton is a 78 y.o. 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 He was quite restless in the ED and required Haldol and subsequently needed a  Precedex infusion to control his agitation.    Subjective: He has no complaints today.     Assessment & Plan:   Principal Problem:   Choledocholithiasis with cholangitis - s/p ERCP/ sphincterotomy and stenting of pancreatic duct - quite stable now on regular diet - cholecystectomy planned for outpatient - d/w Dr Rosendo Gros today Active Problems:  Acute encephalopathy - ? Due to underlying infection - resolved- d/c sitter     Chronic pain - continue Oxycodone 30 mg every 6 hrs- he takes 80 mg BID or Oxycontin at home along with Lyrica which is on hold- - have resumed Lyrica  at 100 mg TID instead of the 200 TID that he takes at home - no complaints of pain today    Urinary retention - Flomax resumed- will give a voiding trial today  Dysphagia - advance diet as tolerated- currently requiring a D1 diet with nectar thick liquids    Constipation - resolved    Protein-calorie malnutrition, severe - continue dietary supplements   Time spent in minutes: 35  DVT prophylaxis: Heparin Code Status: full code Family Communication: wife Disposition Plan: will go to SNF when bed available Consultants:   PCCM  GI Procedures:   ERCP on 2/14 Antimicrobials:  Anti-infectives (From admission, onward)   Start     Dose/Rate Route Frequency Ordered Stop   09/25/18 1200  piperacillin-tazobactam (ZOSYN) IVPB 3.375 g  Status:  Discontinued     3.375 g 12.5 mL/hr over 240 Minutes Intravenous Every 8 hours 09/25/18 0502 09/28/18 1118   09/25/18 0515   piperacillin-tazobactam (ZOSYN) IVPB 3.375 g     3.375 g 100 mL/hr over 30 Minutes Intravenous  Once 09/25/18 0501 09/25/18 0844       Objective: Vitals:   10/02/18 2338 10/03/18 0329 10/03/18 0715 10/03/18 0733  BP: (!) 139/97 113/76  116/83  Pulse: 96 88 (!) 106 95  Resp: 17 15 (!) 25 17  Temp: 98.3 F (36.8 C) 98.6 F (37 C)  97.7 F (36.5 C)  TempSrc: Oral Oral  Oral  SpO2: 96% 97% 98% 100%  Weight:      Height:        Intake/Output Summary (Last 24 hours) at 10/03/2018 1026 Last data filed at 10/03/2018 0900 Gross per 24 hour  Intake 720 ml  Output 800 ml  Net -80 ml   Filed Weights   09/25/18 0500  Weight: 71.7 kg    Examination: General exam: Appears comfortable  HEENT: PERRLA, oral mucosa moist, no sclera icterus or thrush Respiratory system: Clear to auscultation. Respiratory effort normal. Cardiovascular system: S1 & S2 heard,  No murmurs  Gastrointestinal system: Abdomen soft, non-tender, nondistended. Normal bowel sounds   Central nervous system: Alert and oriented. No focal neurological deficits. Extremities: No cyanosis, clubbing or edema Skin: No rashes or ulcers Psychiatry:  Mood & affect appropriate.      Data Reviewed: I have personally reviewed following labs and imaging studies  CBC: Recent Labs  Lab 09/27/18 0528 09/28/18 0252  09/29/18 0241 09/30/18 0247 10/01/18 0254  WBC 9.9 7.8 9.0 8.8 5.9  HGB 13.2 12.4* 12.6* 12.3* 11.2*  HCT 37.1* 35.4* 36.4* 34.9* 31.9*  MCV 88.1 89.8 91.0 90.2 89.4  PLT 124* 124* 127* 115* 053*   Basic Metabolic Panel: Recent Labs  Lab 09/29/18 0241 09/29/18 2306 09/30/18 0247 09/30/18 0820 10/01/18 0254 10/02/18 0259 10/03/18 0219  NA 150* 138  --  138 137 139  --   K 3.2* 2.9*  --  3.4* 3.0* 3.1*  --   CL 119* 111  --  109 107 103  --   CO2 22 22  --  21* 21* 25  --   GLUCOSE 134* 111*  --  117* 128* 139*  --   BUN 31* 28*  --  20 19 13   --   CREATININE 0.69 0.63  --  0.62 0.59* 0.70  --     CALCIUM 8.8* 8.3*  --  8.6* 8.2* 8.7*  --   MG 2.1  --  1.9  --  2.0 2.0 2.1  PHOS 3.6  --  3.3  --  3.2 3.0 3.1   GFR: Estimated Creatinine Clearance: 70 mL/min (by C-G formula based on SCr of 0.7 mg/dL). Liver Function Tests: Recent Labs  Lab 09/27/18 0528 09/28/18 0252 09/29/18 0241 09/30/18 0820 10/01/18 0254  AST 104* 91* 113* 73* 54*  ALT 65* 67* 86* 71* 61*  ALKPHOS 113 95 92 81 81  BILITOT 2.2* 1.9* 1.7* 1.9* 1.2  PROT 6.9 6.4* 6.2* 5.5* 5.1*  ALBUMIN 3.8 3.6 3.4* 3.0* 2.8*   Recent Labs  Lab 09/30/18 0247 09/30/18 0820  LIPASE 18  --   AMYLASE  --  41   Recent Labs  Lab 09/27/18 1102 09/30/18 0247  AMMONIA 30 34   Coagulation Profile: No results for input(s): INR, PROTIME in the last 168 hours. Cardiac Enzymes: No results for input(s): CKTOTAL, CKMB, CKMBINDEX, TROPONINI in the last 168 hours. BNP (last 3 results) No results for input(s): PROBNP in the last 8760 hours. HbA1C: No results for input(s): HGBA1C in the last 72 hours. CBG: No results for input(s): GLUCAP in the last 168 hours. Lipid Profile: No results for input(s): CHOL, HDL, LDLCALC, TRIG, CHOLHDL, LDLDIRECT in the last 72 hours. Thyroid Function Tests: No results for input(s): TSH, T4TOTAL, FREET4, T3FREE, THYROIDAB in the last 72 hours. Anemia Panel: No results for input(s): VITAMINB12, FOLATE, FERRITIN, TIBC, IRON, RETICCTPCT in the last 72 hours. Urine analysis:    Component Value Date/Time   COLORURINE YELLOW 09/25/2018 1514   APPEARANCEUR CLEAR 09/25/2018 1514   LABSPEC >1.046 (H) 09/25/2018 1514   PHURINE 6.0 09/25/2018 1514   GLUCOSEU NEGATIVE 09/25/2018 1514   HGBUR SMALL (A) 09/25/2018 1514   BILIRUBINUR NEGATIVE 09/25/2018 1514   KETONESUR 80 (A) 09/27/2018 1112   PROTEINUR NEGATIVE 09/25/2018 1514   NITRITE NEGATIVE 09/25/2018 1514   LEUKOCYTESUR NEGATIVE 09/25/2018 1514   Sepsis Labs: @LABRCNTIP (procalcitonin:4,lacticidven:4) ) Recent Results (from the past 240  hour(s))  Surgical pcr screen     Status: None   Collection Time: 09/26/18  2:11 AM  Result Value Ref Range Status   MRSA, PCR NEGATIVE NEGATIVE Final   Staphylococcus aureus NEGATIVE NEGATIVE Final    Comment: (NOTE) The Xpert SA Assay (FDA approved for NASAL specimens in patients 67 years of age and older), is one component of a comprehensive surveillance program. It is not intended to diagnose infection nor to guide or monitor treatment. Performed at [facility]  [facility], [place] [address] [place], [place] [address]          Radiology Studies: No results found.    Scheduled Meds: . DULoxetine  60 mg Oral Daily  . heparin injection (subcutaneous)  5,000 Units Subcutaneous Q8H  . multivitamin with minerals  1 tablet Oral Daily  . oxyCODONE  30 mg Oral Q6H  . pregabalin  100 mg Oral TID  . tamsulosin  0.4 mg Oral Daily   Continuous Infusions:   LOS: 8 days      Debbe Odea, MD [facility] Pager: [email or web address] Password [facility] 10/03/2018, 10:26 AM

## 2018-10-03 NOTE — Clinical Social Work Note (Signed)
CSW spoke with patient and his family he is agreeable to going to SNF for short term rehab, he would prefer [facility] in [place].  CSW informed him that that is one of the facilities that [facility] will try to get him placed, however if they are not able to accept him, then a different facility will have to be chosen.  Patient and family expressed understanding, CSW also informed them that insurance will have to approve patient, and if they do not approve the other options would be home with home health or paying privately.  Patient and his family expressed understanding.  Jones Broom. Riley Eaton, MSW, LCSW 10/03/2018 3:34 PM

## 2018-10-03 NOTE — NC FL2 (Signed)
[place] LEVEL OF CARE SCREENING TOOL     IDENTIFICATION  Patient Name: Riley Eaton Birthdate: 01-15-1941 Sex: male Admission Date (Current Location): 09/24/2018  [facility] and [place] Number:  Herbalist and Address:  [facility] [place], [place] [address], [place], [place] [address]      Provider Number: [id number]  Attending Physician Name and Address:  Debbe Odea, MD  Relative Name and Phone Number:  Trentan, Trippe [phone]  [phone] or southard,michelle Other   [phone]     Current Level of Care: Hospital Recommended Level of Care: [place] Prior Approval Number:    Date Approved/Denied:   PASRR Number: [id number]  Discharge Plan: SNF    Current Diagnoses: Patient Active Problem List   Diagnosis Date Noted  . Protein-calorie malnutrition, severe 09/28/2018  . Nausea & vomiting 09/25/2018  . Choledocholithiasis 09/25/2018  . Chronic pain 09/25/2018  . Urinary retention 09/25/2018  . Constipation 09/25/2018  . Intractable vomiting   . 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 Thoracic aortic aneurysm ([place]) 10/03/2015  . Coronary artery calcification seen on CT scan 10/03/2015    Orientation RESPIRATION BLADDER Height & Weight     Self, Time, Place  Normal Continent Weight: 158 lb (71.7 kg) Height:  5\' 7"  (170.2 cm)  BEHAVIORAL SYMPTOMS/MOOD NEUROLOGICAL BOWEL NUTRITION STATUS      Continent Diet(Dysphagia 3)  AMBULATORY STATUS COMMUNICATION OF NEEDS Skin   Limited Assist Verbally Normal                       Personal Care Assistance Level of Assistance  Bathing, Feeding, Dressing Bathing Assistance: Limited assistance Feeding assistance: Independent Dressing Assistance: Limited assistance     Functional Limitations Info  Sight, Hearing, Speech Sight Info: Adequate Hearing Info: Adequate Speech Info: Adequate    SPECIAL CARE FACTORS FREQUENCY  PT (By licensed PT), OT (By  licensed OT)     PT Frequency: 5x a week OT Frequency: 5x a week            Contractures Contractures Info: Not present    Additional Factors Info  Code Status, Allergies Code Status Info: Full Code Allergies Info: NKA           Current Medications (10/03/2018):  This is the current hospital active medication list Current Facility-Administered Medications  Medication Dose Route Frequency Provider Last Rate Last Dose  . acetaminophen (TYLENOL) tablet 650 mg  650 mg Oral Q6H PRN Ronnette Juniper, MD   650 mg at 10/01/18 0950  . DULoxetine (CYMBALTA) DR capsule 60 mg  60 mg Oral Daily Magdalen Spatz, NP   60 mg at 10/03/18 1245  . heparin injection 5,000 Units  5,000 Units Subcutaneous Q8H Thurnell Lose, MD   5,000 Units at 10/03/18 0542  . hydrALAZINE (APRESOLINE) injection 10 mg  10 mg Intravenous Q6H PRN Thurnell Lose, MD   10 mg at 09/30/18 1330  . iopamidol (ISOVUE-300) 61 % injection 50 mL  50 mL Intravenous Once PRN Margaretha Seeds, MD      . multivitamin with minerals tablet 1 tablet  1 tablet Oral Daily Margaretha Seeds, MD   1 tablet at 10/03/18 [phone]  . ondansetron (ZOFRAN) injection 4 mg  4 mg Intravenous Q6H PRN Ronnette Juniper, MD   4 mg at 10/01/18 0950  . oxyCODONE (Oxy IR/ROXICODONE) immediate release tablet 30 mg  30 mg Oral Q6H Ollis, Brandi L, NP  30 mg at 10/03/18 1159  . pregabalin (LYRICA) capsule 100 mg  100 mg Oral TID Debbe Odea, MD   100 mg at 10/03/18 3735  . promethazine (PHENERGAN) tablet 25 mg  25 mg Oral Q4H PRN Margaretha Seeds, MD   25 mg at 10/02/18 0307  . [place]   Oral PRN Margaretha Seeds, MD      . tamsulosin [facility]) capsule 0.4 mg  0.4 mg Oral Daily Noe Gens L, NP   0.4 mg at 10/03/18 7897     Discharge Medications: Please see discharge summary for a list of discharge medications.  Relevant Imaging Results:  Relevant Lab Results:   Additional Information SSN [id number]  Ross Ludwig, LCSW

## 2018-10-03 NOTE — Progress Notes (Signed)
Foley removed at 2315 per order. Pt has not voided since removal of foley. Attempted to void X3 times with urinal no success.  Bladder scanned patient at 0553 BSV 219 ml.

## 2018-10-03 NOTE — Progress Notes (Signed)
  Speech Language Pathology Treatment: Dysphagia  Patient Details Name: Riley Eaton MRN: [id number] DOB: [birth year] Today's Date: 10/03/2018 Time: 6644-0347 SLP Time Calculation (min) (ACUTE ONLY): 18 min  Assessment / Plan / Recommendation Clinical Impression  Pt much calmer and participatory today with family at bedside, though cues still required to attend to PO intake, pt fairly unwilling to follow gentle suggestions to guide PO intake. Pt able to consume a [employer] ham biscuit without difficulty and refused most of his chopped breakfast tray. He persistently coughs after sips of thin liquids. Wife and son report they have observed this for a while prior to admission. Trialed nectar thick orange juice with reduced coughing but immediate recognition and refusal from pt. Reviewed chest imaging, pt has CT chest on admit with no acute finding. Given that his dysphagia was present at baseline but has not resulted in pna at this point and also pts inability to participate in objective testing or diet modification due to mentation do not see any positive result in seeking MBS or diet modification.   Pt should continue a regular diet and thin liquids as long as he is tolerating it without negative consequence. Wife and son are fully in agreement with plan to defer any dysphagia testing at this point, but SLP advised them to closely monitor pt for increased coughing, congestion and pna as signs that pts swallow function may need to be addressed in the future.  For now, will advance diet to regular thin.    HPI HPI: Pt is a 78 yo male admitted with AMS and ascending cholangitis from obstructing CBD stone s/p ERCP and sphincterotomy. CXR on admission showed large hiatal hernia but no active disease. PMH: chronic back pain      SLP Plan  Continue with current plan of care       Recommendations  Diet recommendations: Regular;Thin liquid Liquids provided via: Cup;Straw Medication Administration:  Whole meds with puree Supervision: Staff to assist with self feeding;Full supervision/cueing for compensatory strategies Compensations: Slow rate;Small sips/bites Postural Changes and/or Swallow Maneuvers: Seated upright 90 degrees;Upright 30-60 min after meal                Oral Care Recommendations: Oral care BID Follow up Recommendations: Skilled Nursing facility Plan: Continue with current plan of care       GO               Riley Baltimore, MA [place] Pager [phone] Office [phone]  Lynann Beaver 10/03/2018, 9:30 AM

## 2018-10-04 LAB — BASIC METABOLIC PANEL
Anion gap: 12 (ref 5–15)
BUN: 24 mg/dL — ABNORMAL HIGH (ref 8–23)
CO2: 23 mmol/L (ref 22–32)
Calcium: 8.5 mg/dL — ABNORMAL LOW (ref 8.9–10.3)
Chloride: 103 mmol/L (ref 98–111)
Creatinine, Ser: 0.84 mg/dL (ref 0.61–1.24)
GFR calc Af Amer: >60 mL/min (ref >60)
GFR calc non Af Amer: >60 mL/min (ref >60)
Glucose, Bld: 117 mg/dL — ABNORMAL HIGH (ref 70–99)
Potassium: 3.6 mmol/L (ref 3.5–5.1)
Sodium: 138 mmol/L (ref 135–145)

## 2018-10-04 NOTE — Progress Notes (Signed)
PROGRESS NOTE    KIMBER ESTERLY   QQP:[id number]  DOB: 1941-03-27  DOA: 09/24/2018 PCP: Aletha Halim., PA-C   Brief Narrative:  Riley Eaton is a 78 y.o. 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 He was quite restless in the ED and required Haldol and subsequently needed a  Precedex infusion to control his agitation.    Subjective: He has no nausea today.  No abdominal pain or vomiting.    Assessment & Plan:   Principal Problem:   Choledocholithiasis with cholangitis - s/p ERCP/ sphincterotomy and stenting of pancreatic duct - quite stable now on regular diet - cholecystectomy planned for outpatient - d/w Dr Rosendo Gros   Active Problems:  Acute encephalopathy - ? Due to underlying infection - resolved-     Chronic pain - continue Oxycodone 30 mg every 6 hrs- he takes 80 mg BID or Oxycontin at home along with Lyrica which is on hold- - have resumed Lyrica  at 100 mg TID instead of the 200 TID that he takes at home - no complaints of pain    Urinary retention - Flomax resumed-he is voiding well without any retention  Dysphagia -Diet has been advanced to regular which she is tolerating without issues    Constipation - resolved    Protein-calorie malnutrition, severe - continue dietary supplements   Time spent in minutes: 35  DVT prophylaxis: Heparin Code Status: full code Family Communication: wife Disposition Plan: will go to SNF when bed available-most likely tomorrow Consultants:   PCCM  GI Procedures:   ERCP on 2/14 Antimicrobials:  Anti-infectives (From admission, onward)   Start     Dose/Rate Route Frequency Ordered Stop   09/25/18 1200  piperacillin-tazobactam (ZOSYN) IVPB 3.375 g  Status:  Discontinued     3.375 g 12.5 mL/hr over 240 Minutes Intravenous Every 8 hours 09/25/18 0502  09/28/18 1118   09/25/18 0515  piperacillin-tazobactam (ZOSYN) IVPB 3.375 g     3.375 g 100 mL/hr over 30 Minutes Intravenous  Once 09/25/18 0501 09/25/18 0844       Objective: Vitals:   10/03/18 0733 10/03/18 1500 10/03/18 2313 10/04/18 0805  BP: 116/83 117/85 94/60 107/88  Pulse: 95 96 91 93  Resp: 17 18 16 18   Temp: 97.7 F (36.5 C) 98 F (36.7 C) 98.4 F (36.9 C) 98.3 F (36.8 C)  TempSrc: Oral Oral Oral   SpO2: 100% 100% 96% 97%  Weight:      Height:        Intake/Output Summary (Last 24 hours) at 10/04/2018 0843 Last data filed at 10/04/2018 7124 Gross per 24 hour  Intake -  Output 600 ml  Net -600 ml   Filed Weights   09/25/18 0500  Weight: 71.7 kg    Examination: General exam: Appears comfortable  HEENT: PERRLA, oral mucosa moist, no sclera icterus or thrush Respiratory system: Clear to auscultation. Respiratory effort normal. Cardiovascular system: S1 & S2 heard,  No murmurs  Gastrointestinal system: Abdomen soft, non-tender, nondistended. Normal bowel sounds   Central nervous system: Alert and oriented. No focal neurological deficits. Extremities: No cyanosis, clubbing or edema Skin: No rashes or ulcers Psychiatry:  Mood & affect appropriate.   Data Reviewed: I have personally reviewed following labs and imaging studies  CBC: Recent Labs  Lab 09/28/18 0252 09/29/18 0241 09/30/18  0247 10/01/18 0254  WBC 7.8 9.0 8.8 5.9  HGB 12.4* 12.6* 12.3* 11.2*  HCT 35.4* 36.4* 34.9* 31.9*  MCV 89.8 91.0 90.2 89.4  PLT 124* 127* 115* 756*   Basic Metabolic Panel: Recent Labs  Lab 09/29/18 0241 09/29/18 2306 09/30/18 0247 09/30/18 0820 10/01/18 0254 10/02/18 0259 10/03/18 0219 10/04/18 0256  NA 150* 138  --  138 137 139  --  138  K 3.2* 2.9*  --  3.4* 3.0* 3.1*  --  3.6  CL 119* 111  --  109 107 103  --  103  CO2 22 22  --  21* 21* 25  --  23  GLUCOSE 134* 111*  --  117* 128* 139*  --  117*  BUN 31* 28*  --  20 19 13   --  24*  CREATININE 0.69  0.63  --  0.62 0.59* 0.70  --  0.84  CALCIUM 8.8* 8.3*  --  8.6* 8.2* 8.7*  --  8.5*  MG 2.1  --  1.9  --  2.0 2.0 2.1  --   PHOS 3.6  --  3.3  --  3.2 3.0 3.1  --    GFR: Estimated Creatinine Clearance: 66.7 mL/min (by C-G formula based on SCr of 0.84 mg/dL). Liver Function Tests: Recent Labs  Lab 09/28/18 0252 09/29/18 0241 09/30/18 0820 10/01/18 0254  AST 91* 113* 73* 54*  ALT 67* 86* 71* 61*  ALKPHOS 95 92 81 81  BILITOT 1.9* 1.7* 1.9* 1.2  PROT 6.4* 6.2* 5.5* 5.1*  ALBUMIN 3.6 3.4* 3.0* 2.8*   Recent Labs  Lab 09/30/18 0247 09/30/18 0820  LIPASE 18  --   AMYLASE  --  41   Recent Labs  Lab 09/27/18 1102 09/30/18 0247  AMMONIA 30 34   Coagulation Profile: No results for input(s): INR, PROTIME in the last 168 hours. Cardiac Enzymes: No results for input(s): CKTOTAL, CKMB, CKMBINDEX, TROPONINI in the last 168 hours. BNP (last 3 results) No results for input(s): PROBNP in the last 8760 hours. HbA1C: No results for input(s): HGBA1C in the last 72 hours. CBG: No results for input(s): GLUCAP in the last 168 hours. Lipid Profile: No results for input(s): CHOL, HDL, LDLCALC, TRIG, CHOLHDL, LDLDIRECT in the last 72 hours. Thyroid Function Tests: No results for input(s): TSH, T4TOTAL, FREET4, T3FREE, THYROIDAB in the last 72 hours. Anemia Panel: No results for input(s): VITAMINB12, FOLATE, FERRITIN, TIBC, IRON, RETICCTPCT in the last 72 hours. Urine analysis:    Component Value Date/Time   COLORURINE YELLOW 09/25/2018 1514   APPEARANCEUR CLEAR 09/25/2018 1514   LABSPEC >1.046 (H) 09/25/2018 1514   PHURINE 6.0 09/25/2018 1514   GLUCOSEU NEGATIVE 09/25/2018 1514   HGBUR SMALL (A) 09/25/2018 1514   BILIRUBINUR NEGATIVE 09/25/2018 1514   KETONESUR 80 (A) 09/27/2018 1112   PROTEINUR NEGATIVE 09/25/2018 1514   NITRITE NEGATIVE 09/25/2018 1514   LEUKOCYTESUR NEGATIVE 09/25/2018 1514   Sepsis Labs: @LABRCNTIP (procalcitonin:4,lacticidven:4) ) Recent Results (from  the past 240 hour(s))  Surgical pcr screen     Status: None   Collection Time: 09/26/18  2:11 AM  Result Value Ref Range Status   MRSA, PCR NEGATIVE NEGATIVE Final   Staphylococcus aureus NEGATIVE NEGATIVE Final    Comment: (NOTE) The Xpert SA Assay (FDA approved for NASAL specimens in patients 15 years of age and older), is one component of a comprehensive surveillance program. It is not intended to diagnose infection nor to guide or monitor treatment. Performed at [facility]  [facility], [place] [address] [place], [place] [address]       Radiology Studies: No results found.    Scheduled Meds: . DULoxetine  60 mg Oral Daily  . heparin injection (subcutaneous)  5,000 Units Subcutaneous Q8H  . multivitamin with minerals  1 tablet Oral Daily  . oxyCODONE  30 mg Oral Q6H  . pregabalin  100 mg Oral TID  . tamsulosin  0.4 mg Oral Daily   Continuous Infusions:   LOS: 9 days      Debbe Odea, MD [facility] Pager: [email or web address] Password [facility] 10/04/2018, 8:43 AM

## 2018-10-04 NOTE — Progress Notes (Signed)
  Speech Language Pathology Treatment: Dysphagia  Patient Details Name: Riley Eaton MRN: [id number] DOB: [birth year] Today's Date: 10/04/2018 Time: 1030-1050 SLP Time Calculation (min) (ACUTE ONLY): 20 min  Assessment / Plan / Recommendation Clinical Impression  Pt with much improved mentation from previous tx sessions; oriented x4 and able to give detailed information re: hospital stay/diagnosis/past history related to current situation; administered regular/thin liquids via straw/cup without overt s/s of aspiration noted throughout trial; pt with adequate oral prep/propulsion and timely swallow noted; no delayed and/or immediate coughing noted throughout trial this date; wife stated this was an improvement from previously where pt demonstrated coughing post-swallow with liquids intermittently;incidence noted with previous ST note as well.  Recommend continue current diet of Regular/thin liquids with general swallowing precautions in place and full supervision; ST will continue efforts to determine if objective testing is warranted vs diet tolerance as mentation has improved significantly during hospitalization; pt with imminent d/c to SNF per family/nursing; ST can f/u at next venue of care if unable to f/u further in acute setting.  HPI HPI: Pt is a 78 yo male admitted with AMS and ascending cholangitis from obstructing CBD stone s/p ERCP and sphincterotomy. CXR on admission showed large hiatal hernia but no active disease. PMH: chronic back pain      SLP Plan  Continue with current plan of care       Recommendations  Diet recommendations: Regular;Thin liquid Liquids provided via: Cup;Straw Medication Administration: Whole meds with puree Supervision: Staff to assist with self feeding;Full supervision/cueing for compensatory strategies Compensations: Slow rate;Small sips/bites Postural Changes and/or Swallow Maneuvers: Seated upright 90 degrees;Upright 30-60 min after meal                Oral Care Recommendations: Oral care BID Follow up Recommendations: Skilled Nursing facility SLP Visit Diagnosis: Dysphagia, unspecified (R13.10) Plan: Continue with current plan of care                       Elvina Sidle, M.S., CCC-SLP 10/04/2018, 12:16 PM

## 2018-10-05 DIAGNOSIS — G9341 Metabolic encephalopathy: Secondary | ICD-10-CM

## 2018-10-05 MED ORDER — DULOXETINE HCL 60 MG PO CPEP: 60.0000 mg | ORAL_CAPSULE | Freq: Every day | ORAL | 1 refills | Status: DC

## 2018-10-05 MED ORDER — PREGABALIN 100 MG PO CAPS: 100.0000 mg | ORAL_CAPSULE | Freq: Three times a day (TID) | ORAL | 0 refills | Status: DC

## 2018-10-05 MED ORDER — OXYCODONE HCL 30 MG PO TABS: 30.0000 mg | ORAL_TABLET | Freq: Four times a day (QID) | ORAL | 0 refills | Status: DC

## 2018-10-05 MED ORDER — ALUM & MAG HYDROXIDE-SIMETH 200-200-20 MG/5ML PO SUSP: 15.0000 mL | Freq: Four times a day (QID) | ORAL | Status: DC | PRN

## 2018-10-05 NOTE — Progress Notes (Signed)
  Speech Language Pathology Treatment: Dysphagia  Patient Details Name: NYMIR RINGLER MRN: [id number] DOB: September 14, 1940 Today's Date: 10/05/2018 Time: 1638-4536 SLP Time Calculation (min) (ACUTE ONLY): 13 min  Assessment / Plan / Recommendation Clinical Impression  Pt demonstrates drowsiness this am, but able to sit edge of bed with set up/supervision assist. Mastication adequate with extra time, takes sips while chewing which resulted in cough x2. Provided cues to slow rate, clear mouth before sips. Discussed with wife. Pt much improved, to continue current diet. Will f/u as needed while admitted.   HPI HPI: Pt is a 78 yo male admitted with AMS and ascending cholangitis from obstructing CBD stone s/p ERCP and sphincterotomy. CXR on admission showed large hiatal hernia but no active disease. PMH: chronic back pain      SLP Plan  Continue with current plan of care       Recommendations  Diet recommendations: Regular;Thin liquid Liquids provided via: Cup;Straw Medication Administration: Whole meds with puree Supervision: Staff to assist with self feeding;Full supervision/cueing for compensatory strategies Compensations: Slow rate;Small sips/bites Postural Changes and/or Swallow Maneuvers: Seated upright 90 degrees;Upright 30-60 min after meal                Follow up Recommendations: Skilled Nursing facility SLP Visit Diagnosis: Dysphagia, unspecified (R13.10) Plan: Continue with current plan of care       GO               [name], MA [place] Pager [phone] Office [phone]  Lynann Beaver 10/05/2018, 9:48 AM

## 2018-10-05 NOTE — Discharge Summary (Addendum)
Physician Discharge Summary  Riley Eaton PNT:[id number] DOB: July 79, 1942 DOA: 09/24/2018  PCP: Aletha Halim., PA-C  Admit date: 09/24/2018 Discharge date: 10/06/2018  Admitted From: home Disposition:  SNF  Discharge Condition:  stable Diet recommendation:  Low fat diet Code Status: Full code Consultants:   PCCM  GI Procedures:   ERCP on 2/14    Discharge Diagnoses:  Principal Problem:   Choledocholithiasis Active Problems:   Acute metabolic encephalopathy   Nausea & vomiting   Chronic pain   Urinary retention   Constipation   Protein-calorie malnutrition, severe     Brief Summary: Riley Eaton is a 78 y.o.malewithhistory of chronic low back pain on opiods, BPH was brought to the ER from home for chest pain and is found to have possible cholecystitis and ascending cholangitis from a CBD stone. He was quite restless in the ED and required Haldol and subsequently needed a  Precedex infusion to control his agitation.   Hospital Course:  Principal Problem:   Choledocholithiasis with cholangitis - s/p ERCP/ sphincterotomy and stenting of pancreatic duct - quite stable now on regular diet - cholecystectomy planned for outpatient - d/w Dr Rosendo Gros - he will need to f/u as outpt to have the surgery schedules  Active Problems:  Acute encephalopathy - Likely due to underlying infection - resolved-     Chronic pain on chronic narcotics -   Oxycodone has been weaned to 30 mg every 12 hrs- he was previously taking 80 mg BID of Oxycontin at home   - have resumed Lyrica  at 100 mg TID instead of the 200 TID that he takes at home - have reduced the dose of Cymbalta from 60 BID to 60 mg daily - he has been doing well with weaning the doses of above medications and does not complain of back pain in the hospital despite the lowered dosages    Urinary retention - required a foley cath which was removed 3 days ago - Flomax resumed-he is voiding well without any  retention  Dysphagia -Diet has been advanced to regular which she is tolerating without issues    Constipation - resolved    Protein-calorie malnutrition, severe - continue dietary supplements    Discharge Exam: Vitals:   10/05/18 2357 10/06/18 0800  BP: 102/60 104/83  Pulse: 81 94  Resp: 18 16  Temp: 98.9 F (37.2 C) 98.7 F (37.1 C)  SpO2: 100% 96%   Vitals:   10/05/18 0754 10/05/18 1716 10/05/18 2357 10/06/18 0800  BP: (!) 105/57 113/66 102/60 104/83  Pulse: (!) 101 92 81 94  Resp:  19 18 16   Temp:  97.8 F (36.6 C) 98.9 F (37.2 C) 98.7 F (37.1 C)  TempSrc:   Oral Oral  SpO2:  96% 100% 96%  Weight:      Height:        General: Pt is alert, awake, not in acute distress Cardiovascular: RRR, S1/S2 +, no rubs, no gallops Respiratory: CTA bilaterally, no wheezing, no rhonchi Abdominal: Soft, NT, ND, bowel sounds + Extremities: no edema, no cyanosis   Discharge Instructions  Discharge Instructions    Diet - low sodium heart healthy   Complete by:  As directed    Increase activity slowly   Complete by:  As directed    Increase activity slowly   Complete by:  As directed      Allergies as of 10/06/2018   No Known Allergies     Medication List  STOP taking these medications   doxycycline 100 MG capsule Commonly known as:  VIBRAMYCIN   HYDROcodone-acetaminophen 5-325 MG tablet Commonly known as:  NORCO/VICODIN   oxyCODONE 80 mg 12 hr tablet Commonly known as:  OXYCONTIN Replaced by:  oxycodone 30 MG immediate release tablet   promethazine 25 MG tablet Commonly known as:  PHENERGAN   traZODone 50 MG tablet Commonly known as:  DESYREL   triamcinolone ointment 0.1 % Commonly known as:  KENALOG     TAKE these medications   allopurinol 100 MG tablet Commonly known as:  ZYLOPRIM TAKE 1 TABLET BY MOUTH EVERY DAY   bisacodyl 10 MG suppository Commonly known as:  DULCOLAX Place 1 suppository (10 mg total) rectally as needed for  moderate constipation.   calcium-vitamin D 500-200 MG-UNIT tablet Commonly known as:  OSCAL WITH D Take 1 tablet by mouth daily with breakfast.   DULoxetine 60 MG capsule Commonly known as:  CYMBALTA Take 1 capsule (60 mg total) by mouth daily. What changed:  when to take this   oxycodone 30 MG immediate release tablet Commonly known as:  ROXICODONE Take 1 tablet (30 mg total) by mouth every 12 (twelve) hours as needed for severe pain. Replaces:  oxyCODONE 80 mg 12 hr tablet   pregabalin 100 MG capsule Commonly known as:  LYRICA Take 1 capsule (100 mg total) by mouth 3 (three) times daily. What changed:    medication strength  See the new instructions.   tamsulosin 0.4 MG Caps capsule Commonly known as:  FLOMAX Take 0.4 mg by mouth daily.   TYLENOL PM EXTRA STRENGTH PO Take 1 tablet by mouth at bedtime.       Contact information for follow-up providers    Sur[facility], [place]. Schedule an appointment as soon as possible for a visit in 1 month(s).   Specialty:  General Surgery Why:  call to make an appointment to discuss removal of your gallbladder Contact information: [place] [phone]            Contact information for after-discharge care    Destination    [facility] .   Service:  Skilled Nursing Contact information: [address] [place] [phone]                 No Known Allergies   Procedures/Studies:  Dg Chest 2 View  Result Date: 09/24/2018 CLINICAL DATA:  Chest pain.  Nausea, vomiting. EXAM: CHEST - 2 VIEW COMPARISON:  10/26/2017 FINDINGS: Large hiatal hernia. Heart is borderline in size. No confluent airspace opacity, effusion or edema. No acute bony abnormality. IMPRESSION: Large hiatal hernia.  Borderline heart size.  No active disease. Electronically Signed   By: Rolm Baptise M.D.   On: 09/24/2018 21:17   Dg  Abd 1 View  Result Date: 09/28/2018 CLINICAL DATA:  Feeding tube positioning EXAM: ABDOMEN - 1 VIEW COMPARISON:  None. FINDINGS: The Panda feeding tube tip is in the hiatal hernia. Various attempts and maneuvers were made by Radiology technologist personnel and by myself to position this tube into the intra-portion of the stomach without success. IMPRESSION: 1. Panda feeding tube tip in the intrathoracic portion of the hiatal hernia. Electronically Signed   By: Van Clines M.D.   On: 09/28/2018 17:13   Ct Abdomen Pelvis W Contrast  Result Date: 09/24/2018 CLINICAL DATA:  Abdominal pain EXAM: CT ABDOMEN AND PELVIS WITH CONTRAST TECHNIQUE:  Multidetector CT imaging of the abdomen and pelvis was performed using the standard protocol following bolus administration of intravenous contrast. CONTRAST:  199mL OMNIPAQUE 300 COMPARISON:  None. FINDINGS: Lower chest: Lung bases are free of acute infiltrate or sizable effusion. Coronary calcifications are identified. Moderate sized hiatal hernia is noted. Hepatobiliary: Gallbladder is well distended. The liver is within normal limits. No definitive common bile duct stone is seen. Pancreas: The pancreas is predominately atrophic. Spleen: Spleen is within normal limits. Adrenals/Urinary Tract: Adrenal glands are unremarkable. Kidneys demonstrate a normal enhancement pattern bilaterally. Normal excretion of contrast is seen. Bladder is well distended. Stomach/Bowel: Fecal material is noted throughout the colon consistent with mild constipation. The appendix is within normal limits. No small bowel inflammatory changes are seen. The stomach demonstrates a hiatal hernia. Vascular/Lymphatic: Aortic atherosclerosis. No enlarged abdominal or pelvic lymph nodes. Reproductive: Prostate is unremarkable. Other: No abdominal wall hernia or abnormality. No abdominopelvic ascites. Musculoskeletal: Degenerative changes of the lumbar spine are noted. No acute compression deformity is  seen. Scoliosis concave to the left is noted in the mid lumbar spine. IMPRESSION: Changes of constipation.  No other focal abnormality is noted Electronically Signed   By: Inez Catalina M.D.   On: 09/24/2018 22:48   Mr Abdomen Mrcp Wo Contrast  Result Date: 09/25/2018 CLINICAL DATA:  Evaluate for distal common bile duct stone. EXAM: MRI ABDOMEN WITHOUT CONTRAST  (INCLUDING MRCP) TECHNIQUE: Multiplanar multisequence MR imaging of the abdomen was performed. Heavily T2-weighted images of the biliary and pancreatic ducts were obtained, and three-dimensional MRCP images were rendered by post processing. COMPARISON:  Chest CT 09/25/2018 and abdominal CT scan 09/24/2018. Ultrasound examination 09/25/2018 FINDINGS: Examination is quite limited due to respiratory motion. Lower chest: The lung bases are grossly clear. Moderate eventration of the right hemidiaphragm. Mild cardiac enlargement but no pericardial effusion. Hepatobiliary: No obvious focal hepatic lesions. There is mild intrahepatic biliary dilatation and mild common bile duct dilatation, measuring up to 10 mm. The gallbladder is moderately distended with maximum transverse diameter of 6.1 cm. A few small layering gallstones are also noted. No gallbladder wall thickening or pericholecystic inflammatory changes or fluid to suggest acute cholecystitis. There is an 8 x 7 mm distal common bile duct stone. Pancreas:  No obvious mass, inflammation or ductal dilatation. Spleen:  Borderline splenomegaly.  No focal lesions. Adrenals/Urinary Tract: The adrenal glands and kidneys are grossly normal. Stomach/Bowel: Visualized portions within the abdomen are unremarkable. Vascular/Lymphatic: No pathologically enlarged lymph nodes identified. No abdominal aortic aneurysm demonstrated. Other:  No ascites or abdominal wall hernia. Musculoskeletal: No significant bony findings. Lower lumbar scoliosis noted. IMPRESSION: 1. 8 x 7 mm distal common bile duct stone. 2. Moderate  gallbladder distension and mild common bile duct dilatation (10 mm). Small gallstones noted the gallbladder. 3. Limited examination but no gross abnormality involving the liver, pancreas or spleen. Mild splenomegaly. Electronically Signed   By: Marijo Sanes M.D.   On: 09/25/2018 13:17   Dg Ercp Biliary & Pancreatic Ducts  Result Date: 09/26/2018 CLINICAL DATA:  Choledocholithiasis. EXAM: ERCP TECHNIQUE: Multiple spot images obtained with the fluoroscopic device and submitted for interpretation post-procedure. COMPARISON:  MRCP-earlier same day FLUOROSCOPY TIME:  5 minutes, 10 seconds (48 mGy) FINDINGS: Twelve spot intraoperative fluoroscopic images of the right upper abdominal quadrant during ERCP are provided for review. Initial image demonstrates an ERCP probe overlying the right upper abdominal quadrant with potential cannulation of the pancreatic duct. Subsequent images demonstrate opacification of the common bile duct. There is  an apparent nonocclusive filling defect within distal aspect of the CBD (image 11), not definitely seen on completion image (image 12). IMPRESSION: ERCP with findings suggestive of nonocclusive choledocholithiasis with subsequent presumed stone extraction. These images were submitted for radiologic interpretation only. Please see the procedural report for the amount of contrast and the fluoroscopy time utilized. Electronically Signed   By: Sandi Mariscal M.D.   On: 09/26/2018 08:32   Dg Abd Portable 1v  Result Date: 09/28/2018 CLINICAL DATA:  Feeding tube placement. EXAM: PORTABLE ABDOMEN - 1 VIEW COMPARISON:  CT, 09/24/2018 FINDINGS: Enteric feeding tube tip projects within the hiatal hernia at the level of the medial left hemidiaphragm. It does not extend below the diaphragm. Normal bowel gas pattern. IMPRESSION: Enteric feeding tube tip projects within the hiatal hernia, tip just above the medial left hemidiaphragm, not entering the stomach. Electronically Signed   By: Lajean Manes M.D.   On: 09/28/2018 13:09   Ct Angio Chest/abd/pel For Dissection W And/or W/wo  Result Date: 09/25/2018 CLINICAL DATA:  Chest pain with acute aortic syndrome suspected. Patient already received contrast tonight but has normal renal function, is not diabetic, and is to be hydrated. A reduced dose was used. EXAM: CT ANGIOGRAPHY CHEST, ABDOMEN AND PELVIS TECHNIQUE: Multidetector CT imaging through the chest, abdomen and pelvis was performed using the standard protocol during bolus administration of intravenous contrast. Multiplanar reconstructed images and MIPs were obtained and reviewed to evaluate the vascular anatomy. CONTRAST:  49mL OMNIPAQUE IOHEXOL 300 MG/ML  SOLN COMPARISON:  Abdomen and pelvis CT from earlier today. FINDINGS: CTA CHEST FINDINGS Cardiovascular: Noncontrast phase shows no intramural hematoma of the aorta. No cardiomegaly or pericardial effusion. No aortic dissection inflammatory wall thickening on postcontrast imaging. Partial coverage of the right proximal subclavian shows likely high-grade stenosis. There is limited opacification of the pulmonary arteries with no central filling defect noted. Mediastinum/Nodes: Negative for adenopathy.  Large hiatal hernia. Lungs/Pleura: Central airways are clear. There is no edema, consolidation, effusion, or pneumothorax. Musculoskeletal: No acute or aggressive finding. Spinal degeneration and dextroscoliosis. Review of the MIP images confirms the above findings. CTA ABDOMEN AND PELVIS FINDINGS VASCULAR Aorta: Atheromatous plaque.  No aneurysm or dissection. Celiac: Patent without evidence of aneurysm, dissection, vasculitis or significant stenosis. SMA: Minimal atheromatous changes. No branch occlusion, beading, or flow limiting stenosis. Renals: Mild calcified plaque at the renal artery ostia. No stenosis or aneurysm seen. IMA: Patent Inflow: Atherosclerosis Veins: Negative Review of the MIP images confirms the above findings. NON-VASCULAR  Hepatobiliary: Mild altered perfusion about the gallbladder fossa better evaluated on prior.Full gallbladder which was evaluated by right upper quadrant ultrasound earlier. There is a 10 mm high-density at the distal common bile duct. The common bile duct measures 1 cm in diameter. Pancreas: Prominent fatty atrophy. Spleen: Prominent craniocaudal span but no thickening. Adrenals/Urinary Tract: Negative adrenals. No hydronephrosis or stone. Contrast in the bladder from prior study showing mild trabeculation. Stomach/Bowel: Formed stool throughout the tortuous colon. No obstruction or bowel inflammation is seen. Hiatal hernia as noted above. Lymphatic: No mass or adenopathy. Reproductive:Enlarged prostate Other: No ascites or pneumoperitoneum. Musculoskeletal: Advanced spinal degeneration with dextroscoliosis. 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 Review of the MIP images confirms the above findings. IMPRESSION: 1. Probable 1 cm distal CBD stone 2. No findings of acute aortic syndrome. 3. 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 4. Generalized stool retention. Electronically Signed   By: Monte Fantasia M.D.   On:  09/25/2018 04:43   US Abdomen Limited Ruq  Result Date: 09/25/2018 CLINICAL DATA:  Initial evaluation for acute right upper quadrant abdominal pain. EXAM: ULTRASOUND ABDOMEN LIMITED RIGHT UPPER QUADRANT COMPARISON:  Prior CT from 09/24/2018 FINDINGS: Gallbladder: Internal sludge seen within the gallbladder without frank shadowing echogenic stones. Gallbladder wall mildly thickened up to 5 mm. No free pericholecystic fluid. No sonographic Murphy sign indicated by sonographer. Common bile duct: Diameter: 8 mm Liver: No focal lesion identified. Within normal limits in parenchymal echogenicity. Portal vein is patent on color Doppler imaging with normal direction of blood flow towards the liver. IMPRESSION: 1. Gallbladder sludge with mild  gallbladder wall thickening. No cholelithiasis or additional sonographic features to suggest acute cholecystitis. 2. No biliary dilatation. Electronically Signed   By: Jeannine Boga M.D.   On: 09/25/2018 01:12     The results of significant diagnostics from this hospitalization (including imaging, microbiology, ancillary and laboratory) are listed below for reference.     Microbiology: No results found for this or any previous visit (from the past 240 hour(s)).   Labs: BNP (last 3 results) No results for input(s): BNP in the last 8760 hours. Basic Metabolic Panel: Recent Labs  Lab 09/29/18 2306 09/30/18 0247 09/30/18 0820 10/01/18 0254 10/02/18 0259 10/03/18 0219 10/04/18 0256  NA 138  --  138 137 139  --  138  K 2.9*  --  3.4* 3.0* 3.1*  --  3.6  CL 111  --  109 107 103  --  103  CO2 22  --  21* 21* 25  --  23  GLUCOSE 111*  --  117* 128* 139*  --  117*  BUN 28*  --  20 19 13   --  24*  CREATININE 0.63  --  0.62 0.59* 0.70  --  0.84  CALCIUM 8.3*  --  8.6* 8.2* 8.7*  --  8.5*  MG  --  1.9  --  2.0 2.0 2.1  --   PHOS  --  3.3  --  3.2 3.0 3.1  --    Liver Function Tests: Recent Labs  Lab 09/30/18 0820 10/01/18 0254  AST 73* 54*  ALT 71* 61*  ALKPHOS 81 81  BILITOT 1.9* 1.2  PROT 5.5* 5.1*  ALBUMIN 3.0* 2.8*   Recent Labs  Lab 09/30/18 0247 09/30/18 0820  LIPASE 18  --   AMYLASE  --  41   Recent Labs  Lab 09/30/18 0247  AMMONIA 34   CBC: Recent Labs  Lab 09/30/18 0247 10/01/18 0254  WBC 8.8 5.9  HGB 12.3* 11.2*  HCT 34.9* 31.9*  MCV 90.2 89.4  PLT 115* 137*   Cardiac Enzymes: No results for input(s): CKTOTAL, CKMB, CKMBINDEX, TROPONINI in the last 168 hours. BNP: Invalid input(s): POCBNP CBG: No results for input(s): GLUCAP in the last 168 hours. D-Dimer No results for input(s): DDIMER in the last 72 hours. Hgb A1c No results for input(s): HGBA1C in the last 72 hours. Lipid Profile No results for input(s): CHOL, HDL, LDLCALC, TRIG,  CHOLHDL, LDLDIRECT in the last 72 hours. Thyroid function studies No results for input(s): TSH, T4TOTAL, T3FREE, THYROIDAB in the last 72 hours.  Invalid input(s): FREET3 Anemia work up No results for input(s): VITAMINB12, FOLATE, FERRITIN, TIBC, IRON, RETICCTPCT in the last 72 hours. Urinalysis    Component Value Date/Time   COLORURINE YELLOW 09/25/2018 1514   APPEARANCEUR CLEAR 09/25/2018 1514   LABSPEC >1.046 (H) 09/25/2018 1514   PHURINE 6.0 09/25/2018 1514  GLUCOSEU NEGATIVE 09/25/2018 1514   HGBUR SMALL (A) 09/25/2018 1514   BILIRUBINUR NEGATIVE 09/25/2018 1514   KETONESUR 80 (A) 09/27/2018 1112   PROTEINUR NEGATIVE 09/25/2018 1514   NITRITE NEGATIVE 09/25/2018 1514   LEUKOCYTESUR NEGATIVE 09/25/2018 1514   Sepsis Labs Invalid input(s): PROCALCITONIN,  WBC,  LACTICIDVEN Microbiology No results found for this or any previous visit (from the past 240 hour(s)).   Time coordinating discharge in minutes: 65  SIGNED:   Debbe Odea, MD  [facility] 10/06/2018, 9:21 AM Pager   If 7PM-7AM, please contact night-coverage [email or web address] Password [email or web address]

## 2018-10-05 NOTE — Clinical Social Work Note (Addendum)
[facility] can take pt. Family aware. SNF has started [place]. Will need updated d/c summary on day of d/c.   Loletha Grayer, [facility]

## 2018-10-05 NOTE — Care Management Important Message (Signed)
Important Message  Patient Details  Name: Riley Eaton MRN: [id number] Date of Birth: 25-Sep-1940   Medicare Important Message Given:  Yes    Orbie Pyo 10/05/2018, 4:07 PM

## 2018-10-05 NOTE — Clinical Social Work Placement (Signed)
   CLINICAL SOCIAL WORK PLACEMENT  NOTE  Date:  10/05/2018  Patient Details  Name: Riley Eaton MRN: [id number] Date of Birth: 04/10/1941  Clinical Social Work is seeking post-discharge placement for this patient at the [place] level of care (*CSW will initial, date and re-position this form in  chart as items are completed):  Yes   Patient/family provided with [place] Work Department's list of facilities offering this level of care within the geographic area requested by the patient (or if unable, by the patient's family).  Yes   Patient/family informed of their freedom to choose among providers that offer the needed level of care, that participate in Medicare, Medicaid or managed care program needed by the patient, have an available bed and are willing to accept the patient.  Yes   Patient/family informed of [facility]'s ownership interest in [facility] and [facility], as well as of the fact that they are under no obligation to receive care at these facilities.  PASRR submitted to EDS on 10/03/18     PASRR number received on 10/03/18     Existing PASRR number confirmed on       FL2 transmitted to all facilities in geographic area requested by pt/family on 10/03/18     FL2 transmitted to all facilities within larger geographic area on       Patient informed that his/her managed care company has contracts with or will negotiate with certain facilities, including the following:        Yes   Patient/family informed of bed offers received.  Patient chooses bed at [facility]     Physician recommends and patient chooses bed at      Patient to be transferred to [facility] on 10/06/18.  Patient to be transferred to facility by [facility]     Patient family notified on 10/06/18 of transfer.  Name of family member notified:  [name]--spouse     PHYSICIAN Please sign FL2, Please prepare prescriptions, Please prepare priority  discharge summary, including medications     Additional Comment:    _______________________________________________ Eileen Stanford, LCSW 10/05/2018, 3:32 PM

## 2018-10-06 MED ORDER — OXYCODONE HCL 30 MG PO TABS: 30.0000 mg | ORAL_TABLET | Freq: Two times a day (BID) | ORAL | 0 refills | Status: DC | PRN

## 2018-10-06 MED ORDER — OXYCODONE HCL 5 MG PO TABS: 30.0000 mg | ORAL_TABLET | Freq: Two times a day (BID) | ORAL | Status: DC | PRN

## 2018-10-06 NOTE — Progress Notes (Addendum)
Discharge to: [facility] Anticipated discharge date: 10/06/18 Family notified: Yes, wife by phone Transportation by: [facility]  Report #: [phone]  [place] signing off.  Laveda Abbe LCSW [phone]

## 2018-10-06 NOTE — Progress Notes (Signed)
The patient was unable to discharge to SNF as insurance authorization was pending. He is stable for d/c today. Please see my d/c summary which I have updated today.

## 2018-10-06 NOTE — Progress Notes (Signed)
Physical Therapy Treatment Patient Details Name: Riley Eaton MRN: [id number] DOB: April 02, 1941 Today's Date: 10/06/2018    History of Present Illness Patient is a 78 y/o male who presents with abdominal pain, agitation and CP. Found to have acute choleycytitis s/p ERCP and removal of stone 2/14. Also noted to have acute delirium secondary to narcotic withdrawal. PMH includes thoracic aortic aneurysm and chronic back pain.    PT Comments    Pt with better activity tolerance today compared to last session with max observed HR during gait as 106 (was 140s last session).  Pt continues to need cues for safe RW use and seated rest breaks to walk any distance in the hallway.  PT will continue to follow acutely for safe mobility progression   Follow Up Recommendations  SNF;Supervision for mobility/OOB     Equipment Recommendations  None recommended by PT    Recommendations for Other Services   NA     Precautions / Restrictions Precautions Precautions: Fall;Other (comment) Precaution Comments: monitor vitals    Mobility  Bed Mobility Overal bed mobility: Needs Assistance Bed Mobility: Supine to Sit     Supine to sit: Supervision;HOB elevated     General bed mobility comments: Supervision for safety as he moved to EOB very quicky using momentum.   Transfers Overall transfer level: Needs assistance Equipment used: 4-wheeled walker Transfers: Sit to/from Stand Sit to Stand: Min guard         General transfer comment: Min guard assist for safety, cues to lock rollator breaks before standing, and to push up from the bed.   Ambulation/Gait Ambulation/Gait assistance: Min assist Gait Distance (Feet): 50 Feet(50' seated rest, 50') Assistive device: 4-wheeled walker Gait Pattern/deviations: Step-through pattern;Trunk flexed     General Gait Details: Pt with very flexed trunk, per family this is his baseline posture.  Cues to get a bit closer to rollator for safety and cues  for safe locking of breaks during transitions.  HR 106 during gait today and O2 sats 90% during mobility.           Balance Overall balance assessment: Needs assistance Sitting-balance support: Feet supported;Bilateral upper extremity supported Sitting balance-Leahy Scale: Fair     Standing balance support: Bilateral upper extremity supported Standing balance-Leahy Scale: Poor Standing balance comment: needs exernal support from RW and therapist.                             Cognition Arousal/Alertness: Awake/alert Behavior During Therapy: Impulsive Overall Cognitive Status: Impaired/Different from baseline Area of Impairment: Safety/judgement;Awareness;Problem solving                 Orientation Level: Situation(oriented to time, place, person) Current Attention Level: Selective Memory: Decreased short-term memory Following Commands: Follows one step commands inconsistently;Follows one step commands with increased time Safety/Judgement: Decreased awareness of safety;Decreased awareness of deficits Awareness: Intellectual Problem Solving: Difficulty sequencing;Requires verbal cues;Requires tactile cues General Comments: Mulitmodal cues and repetition needed for pt to follow along with PT's requests, exercises, and session.  Cues for safety throughout, especially related to his rollator use.        Exercises General Exercises - Upper Extremity Shoulder Flexion: AROM;Both;10 reps Elbow Flexion: AROM;Both;10 reps General Exercises - Lower Extremity Long Arc Quad: AROM;Both;10 reps Hip ABduction/ADduction: AROM;Both;10 reps Hip Flexion/Marching: AROM;Both;10 reps Toe Raises: AROM;Both;10 reps Heel Raises: AROM;Both;10 reps        Pertinent Vitals/Pain Pain Assessment: No/denies pain Pain Score: 0-No  pain           PT Goals (current goals can now be found in the care plan section) Acute Rehab PT Goals Patient Stated Goal: to get better so he can go  home Progress towards PT goals: Progressing toward goals    Frequency    Min 2X/week      PT Plan Current plan remains appropriate       AM-PAC PT "6 Clicks" Mobility   Outcome Measure  Help needed turning from your back to your side while in a flat bed without using bedrails?: A Little Help needed moving from lying on your back to sitting on the side of a flat bed without using bedrails?: A Little Help needed moving to and from a bed to a chair (including a wheelchair)?: A Little Help needed standing up from a chair using your arms (e.g., wheelchair or bedside chair)?: A Little Help needed to walk in hospital room?: A Little Help needed climbing 3-5 steps with a railing? : A Lot 6 Click Score: 17    End of Session Equipment Utilized During Treatment: Gait belt Activity Tolerance: Patient limited by fatigue Patient left: in bed;with call bell/phone within reach;with family/visitor present   PT Visit Diagnosis: Unsteadiness on feet (R26.81);Difficulty in walking, not elsewhere classified (R26.2);Muscle weakness (generalized) (M62.81)     Time: 4847-2072 PT Time Calculation (min) (ACUTE ONLY): 16 min  Charges:  $Gait Training: 8-22 mins                    Geri Hepler B. Delfin Squillace, PT, DPT  Acute Rehabilitation [phone] pager #[phone] office   10/06/2018, 3:26 PM

## 2018-10-23 ENCOUNTER — Other Ambulatory Visit: Payer: Self-pay

## 2018-10-23 ENCOUNTER — Ambulatory Visit
Admission: RE | Admit: 2018-10-23 | Discharge: 2018-10-23 | Disposition: A | Discharge status: Home / Self Care | Payer: Medicare Other | Source: Ambulatory Visit | Attending: Gastroenterology | Admitting: Gastroenterology

## 2018-10-23 ENCOUNTER — Telehealth: Payer: Self-pay | Admitting: Neurology

## 2018-10-23 ENCOUNTER — Other Ambulatory Visit: Payer: Self-pay | Admitting: Gastroenterology

## 2018-10-23 DIAGNOSIS — T85528A Displacement of other gastrointestinal prosthetic devices, implants and grafts, initial encounter: Secondary | ICD-10-CM

## 2018-10-23 IMAGING — CR ABDOMEN - 1 VIEW
1 series · 1 of 1 positions shown · non-contrast
Comparison: Abdominal radiograph [DATE]

CLINICAL DATA: Status post gallbladder surgery 2 weeks ago. Assess
for stent migration.

EXAM:
ABDOMEN - 1 VIEW

[w abdomen upright]
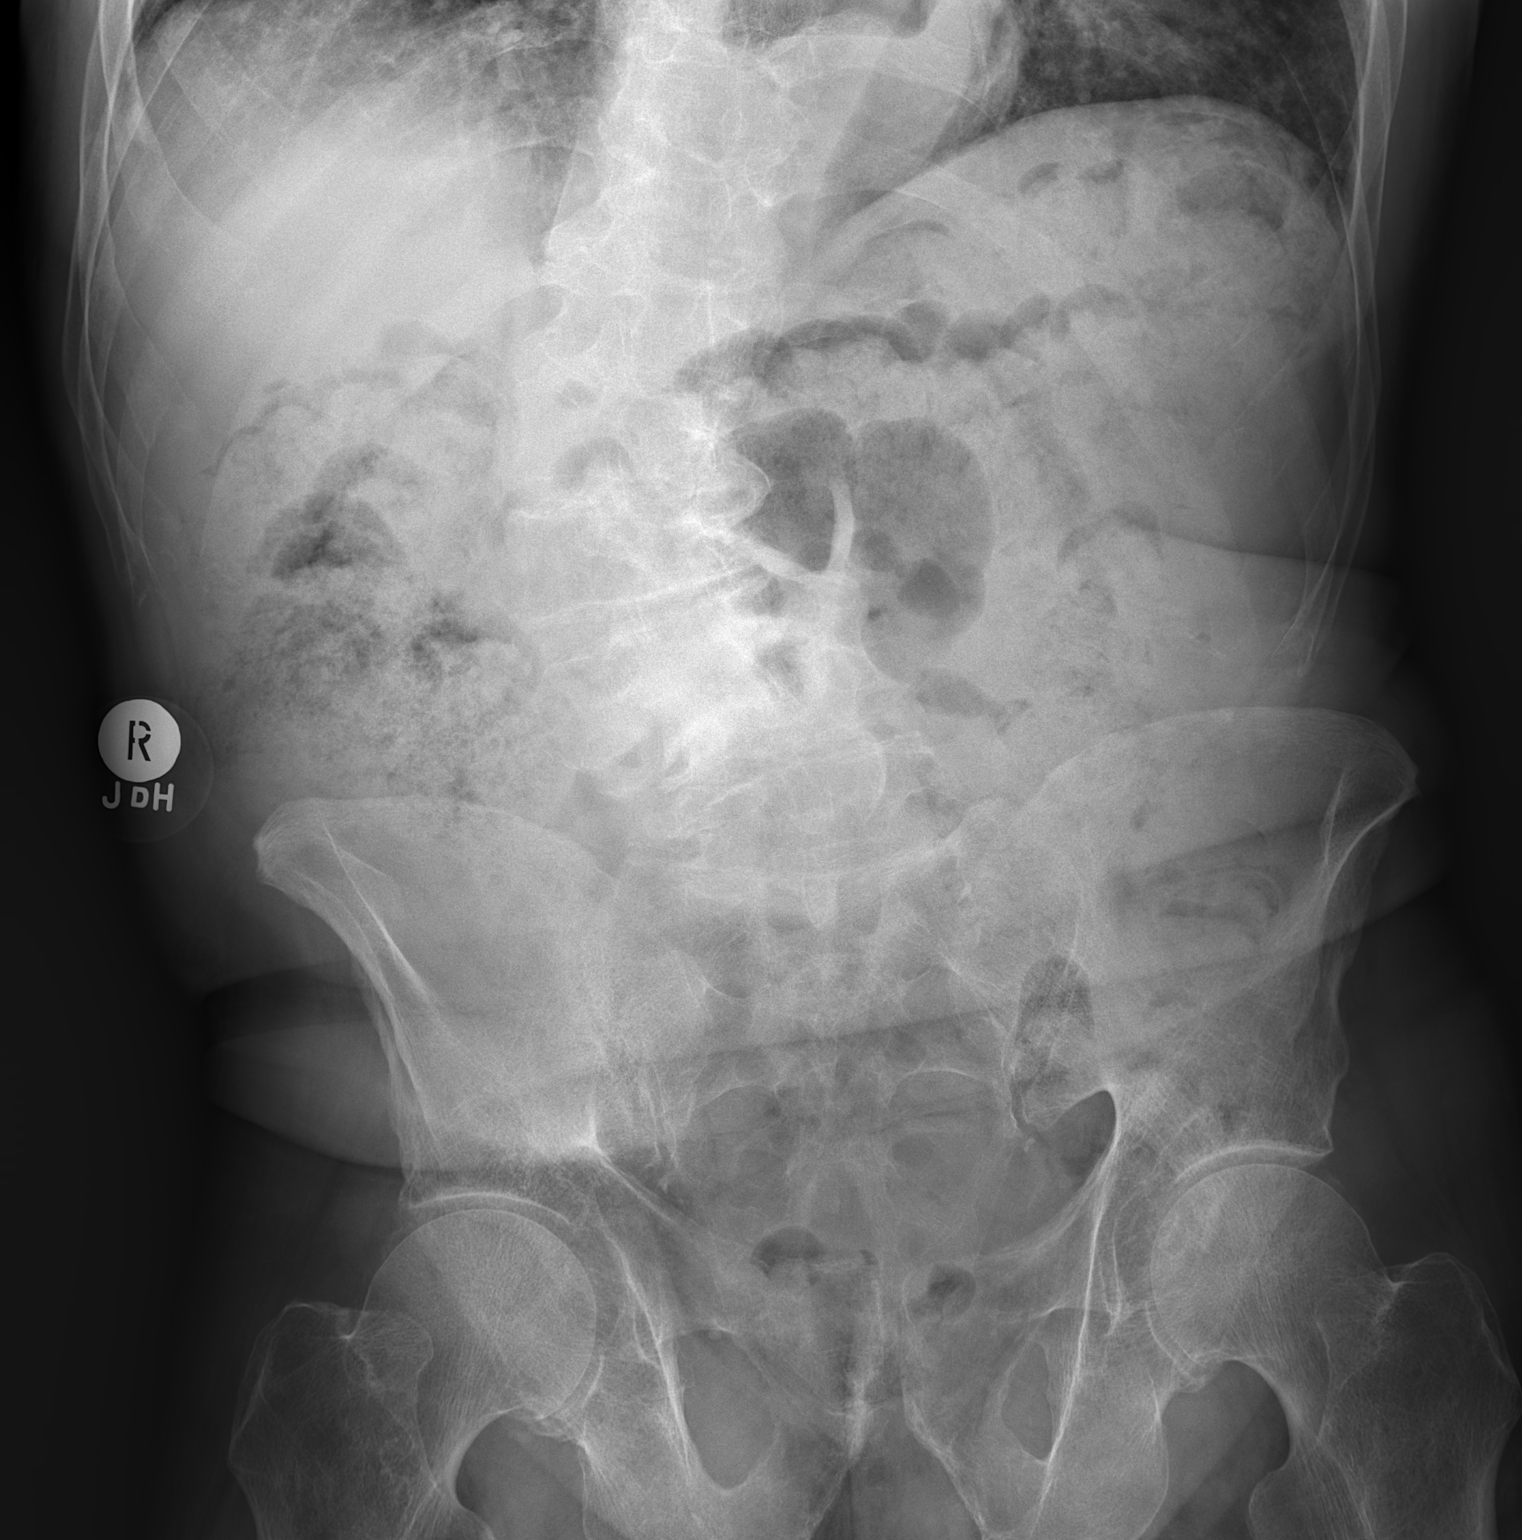

[1 of 1 positions shown; findings below may reference images not displayed]

FINDINGS: Moderate to large volume retained large bowel stool. Bowel gas
pattern is nondilated and nonobstructive. Known stent not
identified. No pathologic calcifications. Bibasilar interstitial
prominence. Soft tissue planes included osseous structures are
nonacute; upper lumbar dextroscoliosis.
IMPRESSION: 1. RIGHT upper quadrant stent not identified, suspected migration.
2. Moderate to large volume retained large bowel stool.
Nonobstructive bowel gas pattern.

## 2018-10-23 MED ORDER — PREGABALIN 200 MG PO CAPS: 200.0000 mg | ORAL_CAPSULE | Freq: Three times a day (TID) | ORAL | 1 refills | Status: DC

## 2018-10-23 MED ORDER — OXYCODONE HCL ER 40 MG PO T12A: 40.0000 mg | EXTENDED_RELEASE_TABLET | Freq: Three times a day (TID) | ORAL | 0 refills | Status: DC

## 2018-10-23 NOTE — Telephone Encounter (Signed)
Patient called in and stated the hospital dropped him to oxyCODONE (ROXICODONE) 30 MG immediate release tablet and he wants to know if he can be put back on oxyCODONE (ROXICODONE) 40 MG immediate release tablet. And he also needs a [facility] [place]

## 2018-10-23 NOTE — Telephone Encounter (Signed)
I called the patient.  The patient has recently gotten surgery for his gallbladder, he was given opiate medications through the surgeon, they have change the dosing on the medication.  I will send in a prescription for the Lyrica 200 mg 3 times daily, and for the OxyContin we will reduce to 40 mg 3 times daily.  The patient has not to accept opiate medications from other physicians, when seen next he will be referred to a pain center.

## 2018-10-23 NOTE — Telephone Encounter (Signed)
See message.

## 2018-11-04 ENCOUNTER — Other Ambulatory Visit: Payer: Self-pay | Admitting: Neurology

## 2018-11-11 ENCOUNTER — Other Ambulatory Visit: Payer: Self-pay

## 2018-11-30 ENCOUNTER — Other Ambulatory Visit: Payer: Self-pay | Admitting: Neurology

## 2018-11-30 MED ORDER — HYDROCODONE-ACETAMINOPHEN 5-325 MG PO TABS: 1.0000 | ORAL_TABLET | Freq: Four times a day (QID) | ORAL | 0 refills | Status: DC | PRN

## 2018-11-30 MED ORDER — OXYCODONE HCL ER 40 MG PO T12A: 40.0000 mg | EXTENDED_RELEASE_TABLET | Freq: Three times a day (TID) | ORAL | 0 refills | Status: DC

## 2018-11-30 NOTE — Telephone Encounter (Signed)
[place] drug registry verified. Last refill for Oxycodone 40 mg was 10/26/18 # 90 for a 30 day supply provided by Dr. Jannifer Franklin. Hydrocodone 5-325 was last refilled on 10/10/18 # 20 for a 5 day supply and rx was given by Cipriano Mile DNP.

## 2018-11-30 NOTE — Telephone Encounter (Signed)
The prescription for oxycodone and hydrocodone will be sent in.

## 2018-11-30 NOTE — Telephone Encounter (Signed)
Pt has called for a refill on his  hydrocodone and oxycodone  [id number]

## 2018-11-30 NOTE — Addendum Note (Signed)
Addended by: Kathrynn Ducking on: 11/30/2018 12:02 PM   Modules accepted: Orders

## 2018-12-14 ENCOUNTER — Other Ambulatory Visit: Payer: Self-pay | Admitting: Neurology

## 2019-01-01 ENCOUNTER — Telehealth: Payer: Self-pay | Admitting: Neurology

## 2019-01-01 ENCOUNTER — Telehealth: Payer: Self-pay | Admitting: Diagnostic Neuroimaging

## 2019-01-01 MED ORDER — OXYCODONE HCL ER 40 MG PO T12A: 40.0000 mg | EXTENDED_RELEASE_TABLET | Freq: Three times a day (TID) | ORAL | 0 refills | Status: DC

## 2019-01-01 MED ORDER — HYDROCODONE-ACETAMINOPHEN 5-325 MG PO TABS: 1.0000 | ORAL_TABLET | Freq: Four times a day (QID) | ORAL | 0 refills | Status: DC | PRN

## 2019-01-01 NOTE — Telephone Encounter (Signed)
Pt states he has now been 2 days out of his  hydrocodone and oxycodone.  Pt would like the on call doctor to have his med[id number] .  Pt was told a message would be sent to the on call doctor as as result of him being without the medications for 2 days so far.

## 2019-01-01 NOTE — Telephone Encounter (Signed)
Patient called in for refills of pain meds.   Meds ordered this encounter  Medications  . oxyCODONE (OXYCONTIN) 40 mg 12 hr tablet    Sig: Take 1 tablet (40 mg total) by mouth 3 (three) times daily.    Dispense:  90 tablet    Refill:  0  . HYDROcodone-acetaminophen (NORCO/VICODIN) 5-325 MG tablet    Sig: Take 1 tablet by mouth every 6 (six) hours as needed for moderate pain.    Dispense:  30 tablet    Refill:  0    Penni Bombard, MD 0/07/2240, 14:64 PM Certified in Neurology, Neurophysiology and [facility] [address] [place], [place] [address] [phone]

## 2019-01-05 NOTE — Telephone Encounter (Signed)
Rx for Hydrocodone and Oxycodone were sent on 01/01/19 by Dr. Leta Baptist.

## 2019-01-27 ENCOUNTER — Telehealth: Payer: Self-pay

## 2019-01-27 NOTE — Telephone Encounter (Signed)
I contacted the pt and was able to discuss 02/01/19 visit.  Due to current COVID 19 pandemic, our office is severely reducing in office visits until further notice, in order to minimize the risk to our patients and healthcare providers.   Pt was comfortable with coming in to the office as scheduled and I advised on covid precautions and check in process.  Pt was advised to arrive at 1000 am.

## 2019-01-29 ENCOUNTER — Telehealth: Payer: Self-pay | Admitting: Neurology

## 2019-01-29 NOTE — Telephone Encounter (Signed)
Pt is needing his HYDROcodone-acetaminophen (NORCO/VICODIN) 5-325 MG tablet and his oxyCODONE (OXYCONTIN) 40 mg 12 hr tablet [facility] [place]

## 2019-02-01 ENCOUNTER — Encounter: Payer: Self-pay | Admitting: Neurology

## 2019-02-01 ENCOUNTER — Other Ambulatory Visit: Payer: Self-pay

## 2019-02-01 ENCOUNTER — Ambulatory Visit: Payer: Medicare Other | Admitting: Neurology

## 2019-02-01 VITALS — BP 137/80 | HR 58 | Temp 95.5°F | Ht 67.0 in | Wt 162.0 lb

## 2019-02-01 DIAGNOSIS — G8929 Other chronic pain: Secondary | ICD-10-CM

## 2019-02-01 DIAGNOSIS — M5441 Lumbago with sciatica, right side: Secondary | ICD-10-CM | POA: Diagnosis not present

## 2019-02-01 DIAGNOSIS — M5442 Lumbago with sciatica, left side: Secondary | ICD-10-CM | POA: Diagnosis not present

## 2019-02-01 MED ORDER — OXYCODONE HCL ER 40 MG PO T12A: 40.0000 mg | EXTENDED_RELEASE_TABLET | Freq: Three times a day (TID) | ORAL | 0 refills | Status: DC

## 2019-02-01 MED ORDER — HYDROCODONE-ACETAMINOPHEN 5-325 MG PO TABS: 1.0000 | ORAL_TABLET | Freq: Four times a day (QID) | ORAL | 0 refills | Status: DC | PRN

## 2019-02-01 NOTE — Telephone Encounter (Signed)
The [facility] registry was checked, the prescriptions for hydrocodone and oxycodone will be sent in.

## 2019-02-01 NOTE — Progress Notes (Signed)
Reason for visit: Chronic low back pain  Gracyn R Kuhlman is an 78 y.o. male  History of present illness:  Mr. Noe is a 78 year old right-handed white male with a history of chronic low back pain.  The patient was in the hospital on 24 September 2018 with cholelithiasis with severe abdominal pain.  The patient had ERCP which seemed to open up the bile duct and the patient has not had any problems with the gallbladder since that time, he is opted not to have a cholecystectomy.  The patient continues have low back pain, he wakes up at least twice a night because of pain and has to take more pain medications.  He walks in a stooped posture, he does not use a cane.  He denies issues with weakness of the legs, he does have pain in the back going down both legs to the feet.  He denies issues controlling the bowels or the bladder.  Past Medical History:  Diagnosis Date  . Chronic back pain   . 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 Chronic, continuous use of opioids    for back pain  . Coronary artery calcification seen on CT scan   . Nerve pain   . Thoracic aortic aneurysm [facility])     Past Surgical History:  Procedure Laterality Date  . BACK SURGERY     09-2016  . back surgey    . ENDOSCOPIC RETROGRADE CHOLANGIOPANCREATOGRAPHY (ERCP) WITH PROPOFOL N/A 09/25/2018   Procedure: ENDOSCOPIC RETROGRADE CHOLANGIOPANCREATOGRAPHY (ERCP) WITH PROPOFOL;  Surgeon: Ronnette Juniper, MD;  Location: [facility];  Service: Gastroenterology;  Laterality: N/A;  . PANCREATIC STENT PLACEMENT  09/25/2018   Procedure: PANCREATIC STENT PLACEMENT;  Surgeon: Ronnette Juniper, MD;  Location: [facility];  Service: Gastroenterology;;  . REMOVAL OF STONES  09/25/2018   Procedure: REMOVAL OF STONES;  Surgeon: Ronnette Juniper, MD;  Location: [facility];  Service: Gastroenterology;;  . [name]  09/25/2018   Procedure: [name];  Surgeon: Ronnette Juniper, MD;  Location: [facility];  Service: Gastroenterology;;    Family  History  Problem Relation Age of Onset  . Cancer Mother   . Heart attack Father     Social history:  reports that he has never smoked. He has never used smokeless tobacco. He reports that he does not drink alcohol or use drugs.   No Known Allergies  Medications:  Prior to Admission medications   Medication Sig Start Date End Date Taking? Authorizing Provider  allopurinol (ZYLOPRIM) 100 MG tablet TAKE 1 TABLET BY MOUTH EVERY DAY 12/14/18  Yes Kathrynn Ducking, MD  bisacodyl (DULCOLAX) 10 MG suppository Place 1 suppository (10 mg total) rectally as needed for moderate constipation. 10/26/17  Yes Varney Biles, MD  calcium-vitamin D (OSCAL WITH D) 500-200 MG-UNIT tablet Take 1 tablet by mouth daily with breakfast.   Yes [provider]  Diphenhydramine-APAP, sleep, (TYLENOL PM EXTRA STRENGTH PO) Take 1 tablet by mouth at bedtime.    Yes [provider]  DULoxetine (CYMBALTA) 60 MG capsule Take 1 capsule (60 mg total) by mouth daily. 10/05/18  Yes Debbe Odea, MD  HYDROcodone-acetaminophen (NORCO/VICODIN) 5-325 MG tablet Take 1 tablet by mouth every 6 (six) hours as needed for moderate pain. 02/01/19  Yes Kathrynn Ducking, MD  oxyCODONE (OXYCONTIN) 40 mg 12 hr tablet Take 1 tablet (40 mg total) by mouth 3 (three) times daily. 02/01/19  Yes Kathrynn Ducking, MD  pregabalin (LYRICA) 200 MG capsule Take 1 capsule (200 mg total)  by mouth 3 (three) times daily. 10/23/18  Yes Kathrynn Ducking, MD  tamsulosin (FLOMAX) 0.4 MG CAPS capsule Take 0.4 mg by mouth daily.  08/29/15  Yes [provider]    ROS:  Out of a complete 14 system review of symptoms, the patient complains only of the following symptoms, and all other reviewed systems are negative.  Chronic low back pain  Blood pressure 137/80, pulse (!) 58, temperature (!) 95.5 F (35.3 C), temperature source Oral, height 5\' 7"  (1.702 m), weight 162 lb (73.5 kg).  Physical Exam  General: The patient is alert and  cooperative at the time of the examination.  Skin: No significant peripheral edema is noted.   Neurologic Exam  Mental status: The patient is alert and oriented x 3 at the time of the examination. The patient has apparent normal recent and remote memory, with an apparently normal attention span and concentration ability.   Cranial nerves: Facial symmetry is present. Speech is normal, no aphasia or dysarthria is noted. Extraocular movements are full. Visual fields are full.  Motor: The patient has good strength in all 4 extremities.  Sensory examination: Soft touch sensation is symmetric on the face, arms, and legs.  Coordination: The patient has good finger-nose-finger and heel-to-shin bilaterally.  Gait and station: The patient has a stooped posture when walking, he is flexed about 45 degrees.  He does not use a cane for ambulation.  He is able to perform tandem gait.  Romberg is negative.  Reflexes: Deep tendon reflexes are symmetric.   Assessment/Plan:  1.  Chronic low back pain  The patient will require a referral to a pain management center.  I will be going part-time in November 2020, at that point, the patient will not be able to reliably get opiate prescriptions through this office.  I will refer him to [facility], he will follow-up here if needed.  Jill Alexanders MD 02/01/2019 10:30 AM  [facility] [address] [place], [place] [address]  Phone [phone] Fax [phone]

## 2019-02-08 ENCOUNTER — Telehealth: Payer: Self-pay | Admitting: Neurology

## 2019-02-08 NOTE — Telephone Encounter (Signed)
Patient is scheduled with Dr. Dian Situ at 3:40 pm 02/10/1999.

## 2019-02-15 ENCOUNTER — Telehealth: Payer: Self-pay | Admitting: Neurology

## 2019-02-15 DIAGNOSIS — G8929 Other chronic pain: Secondary | ICD-10-CM

## 2019-02-15 DIAGNOSIS — M545 Low back pain, unspecified: Secondary | ICD-10-CM

## 2019-02-15 NOTE — Telephone Encounter (Signed)
Pt is asking for a call from Dr Jannifer Franklin to discuss what he was told by the provider [facility].  Pt states he was told it will not work out for him at pain management because the provider there can not prescribe the level of medication that Dr Jannifer Franklin has him on.  Pt is aware Dr Jannifer Franklin is out of the office until Wed.  He is asking for a call to discuss once Dr Jannifer Franklin returns to the office

## 2019-02-16 ENCOUNTER — Other Ambulatory Visit: Payer: Self-pay | Admitting: Pain Medicine

## 2019-02-16 DIAGNOSIS — M79606 Pain in leg, unspecified: Secondary | ICD-10-CM

## 2019-02-16 DIAGNOSIS — M419 Scoliosis, unspecified: Secondary | ICD-10-CM

## 2019-02-16 DIAGNOSIS — M545 Low back pain, unspecified: Secondary | ICD-10-CM

## 2019-02-16 DIAGNOSIS — M961 Postlaminectomy syndrome, not elsewhere classified: Secondary | ICD-10-CM

## 2019-02-16 NOTE — Telephone Encounter (Signed)
Will hold until 02/17/19.

## 2019-02-17 NOTE — Telephone Encounter (Signed)
I called the patient.  The patient claims he was [facility], was told that he needed a spinal stimulator and they could not prescribe his current dose of opiate medication, the patient does not wish to consider a spinal stimulator.  I will try to refer the patient to [facility].

## 2019-02-17 NOTE — Addendum Note (Signed)
Addended by: Kathrynn Ducking on: 02/17/2019 11:18 AM   Modules accepted: Orders

## 2019-03-01 ENCOUNTER — Telehealth: Payer: Self-pay | Admitting: Neurology

## 2019-03-01 MED ORDER — OXYCODONE HCL ER 40 MG PO T12A: 40.0000 mg | EXTENDED_RELEASE_TABLET | Freq: Three times a day (TID) | ORAL | 0 refills | Status: DC

## 2019-03-01 MED ORDER — HYDROCODONE-ACETAMINOPHEN 5-325 MG PO TABS: 1.0000 | ORAL_TABLET | Freq: Four times a day (QID) | ORAL | 0 refills | Status: DC | PRN

## 2019-03-01 NOTE — Telephone Encounter (Signed)
The prescriptions for hydrocodone and oxycodone will be called in.

## 2019-03-01 NOTE — Addendum Note (Signed)
Addended by: Kathrynn Ducking on: 03/01/2019 10:25 AM   Modules accepted: Orders

## 2019-03-01 NOTE — Telephone Encounter (Signed)
[place] drug registry verified. Last refills for both medications was 02/01/2019 provided by Dr. Jannifer Franklin for 30 day supplies.

## 2019-03-01 NOTE — Telephone Encounter (Signed)
Pt is requesting a refill of oxyCODONE (OXYCONTIN) 40 mg 12 hr tablet and HYDROcodone-acetaminophen (NORCO/VICODIN) 5-325 MG tablet, [id number] [place] [place] - [address]

## 2019-03-10 ENCOUNTER — Ambulatory Visit
Admission: RE | Admit: 2019-03-10 | Discharge: 2019-03-10 | Disposition: A | Discharge status: Home / Self Care | Payer: Medicare Other | Source: Ambulatory Visit | Attending: Pain Medicine | Admitting: Pain Medicine

## 2019-03-10 ENCOUNTER — Other Ambulatory Visit: Payer: Self-pay

## 2019-03-10 DIAGNOSIS — M545 Low back pain, unspecified: Secondary | ICD-10-CM

## 2019-03-10 DIAGNOSIS — M961 Postlaminectomy syndrome, not elsewhere classified: Secondary | ICD-10-CM

## 2019-03-10 DIAGNOSIS — M79606 Pain in leg, unspecified: Secondary | ICD-10-CM

## 2019-03-10 DIAGNOSIS — M419 Scoliosis, unspecified: Secondary | ICD-10-CM

## 2019-03-10 IMAGING — MR MRI LUMBAR SPINE WITHOUT AND WITH CONTRAST
7 series · 48 of 48 positions shown · IV contrast (multihance)
Comparison: [DATE]

CLINICAL DATA: Chronic back pain radiating to both legs

EXAM:
MRI LUMBAR SPINE WITHOUT AND WITH CONTRAST
TECHNIQUE: Multiplanar and multiecho pulse sequences of the lumbar spine were
obtained without and with intravenous contrast.
CONTRAST:  14mL MULTIHANCE GADOBENATE DIMEGLUMINE 529 MG/ML IV SOLN

[Series 3: tirm sag · sagittal · 4.0mm · 0.73mm/px · 6 of 18 slices shown]
[im 1/18]
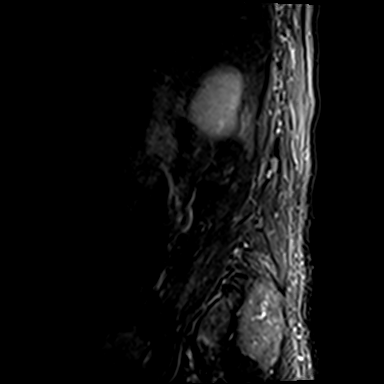
[im 4/18]
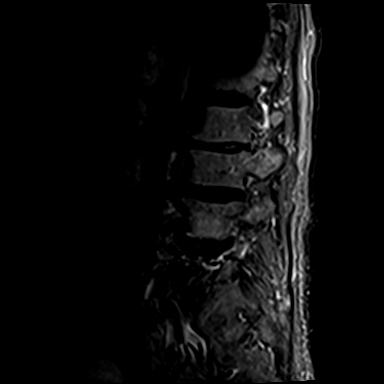
[im 7/18]
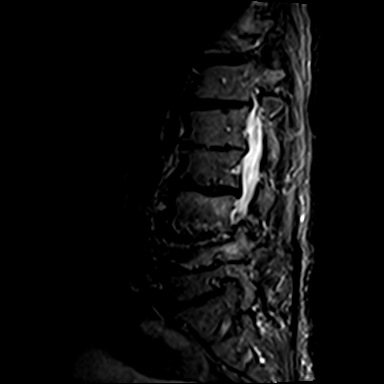
[im 11/18]
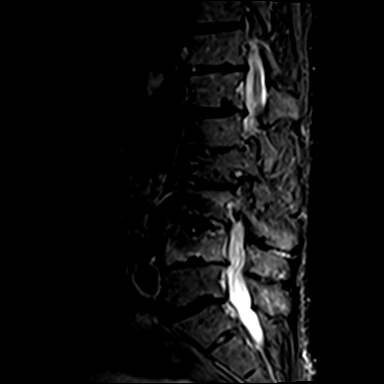
[im 14/18]
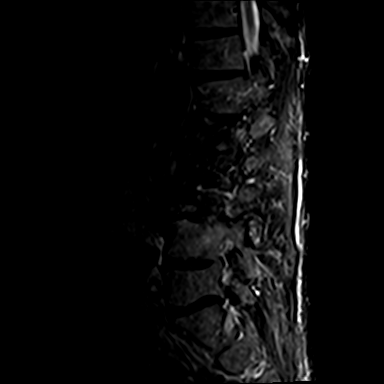
[im 18/18]
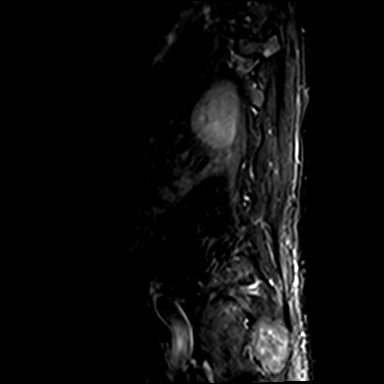

[Series 4: T1 · sagittal · 4.0mm · 0.88mm/px · 5 of 18 slices shown (1 of 2)]
[im 1/18]
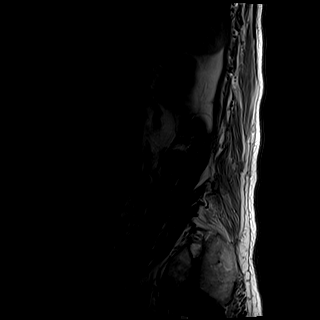
[im 5/18]
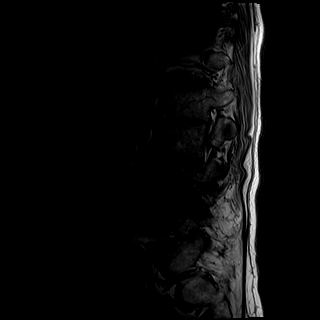
[im 9/18]
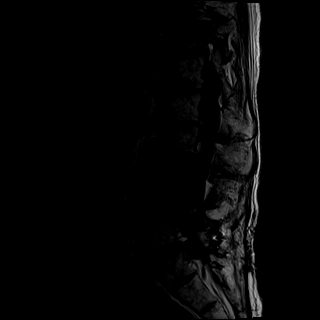
[im 13/18]
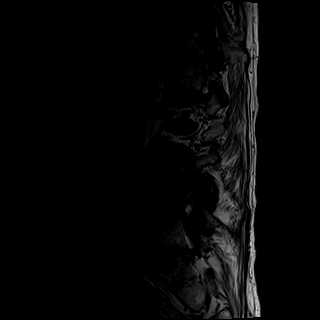
[im 18/18]
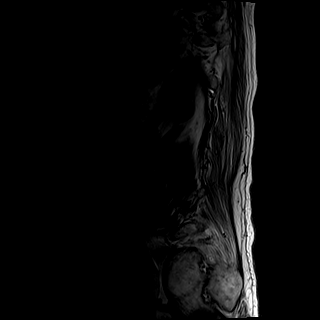

[Series 5: T1 · axial · 4.0mm · 0.78mm/px · z∈[-118,+55]mm · 9 of 34 slices shown (2 of 2)]
[im 1/34]
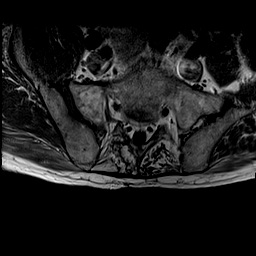
[im 5/34]
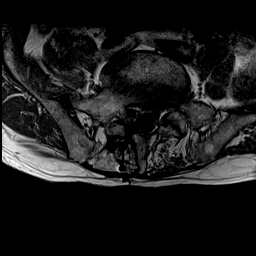
[im 9/34]
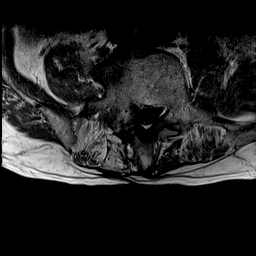
[im 13/34]
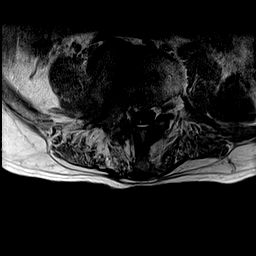
[im 17/34]
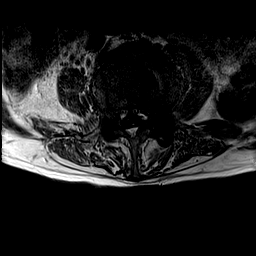
[im 21/34]
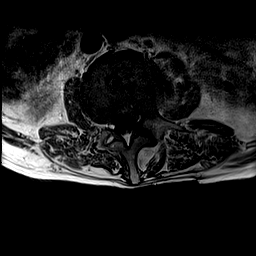
[im 25/34]
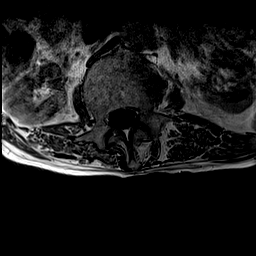
[im 29/34]
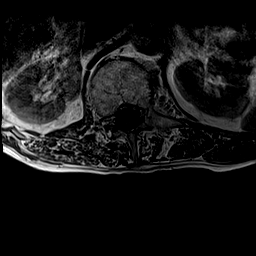
[im 34/34]
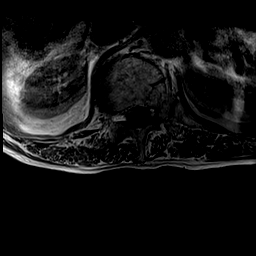

[Series 6: T2 · axial · 4.0mm · 0.78mm/px · z∈[-118,+55]mm · 9 of 34 slices shown]
[im 1/34]
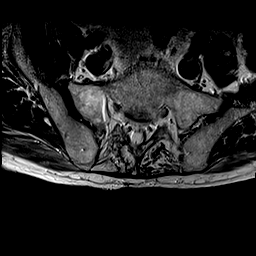
[im 5/34]
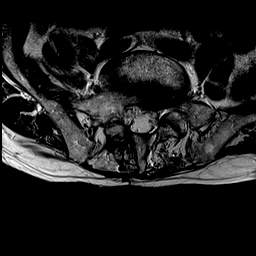
[im 9/34]
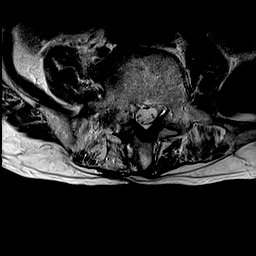
[im 13/34]
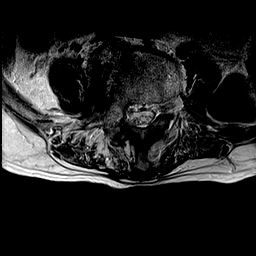
[im 17/34]
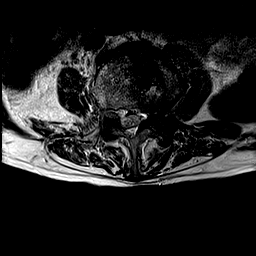
[im 21/34]
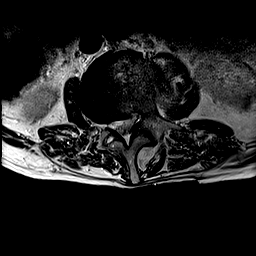
[im 25/34]
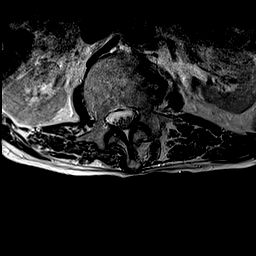
[im 29/34]
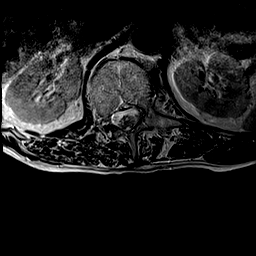
[im 34/34]
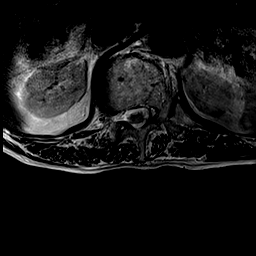

[Series 7: T2 post-contrast · sagittal · 4.0mm · 0.88mm/px · 5 of 18 slices shown]
[im 1/18]
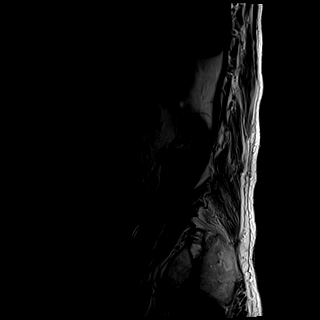
[im 5/18]
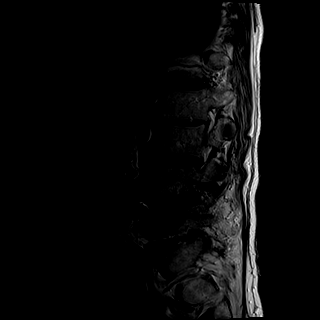
[im 9/18]
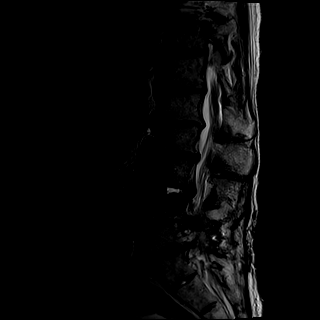
[im 13/18]
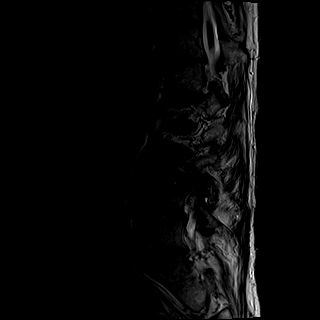
[im 18/18]
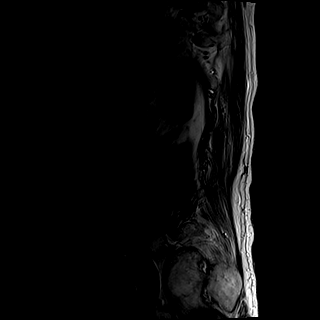

[Series 8: T1 fat-sat post-contrast · sagittal · 4.0mm · 0.88mm/px · 5 of 18 slices shown]
[im 1/18]
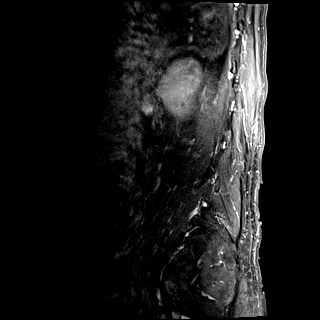
[im 5/18]
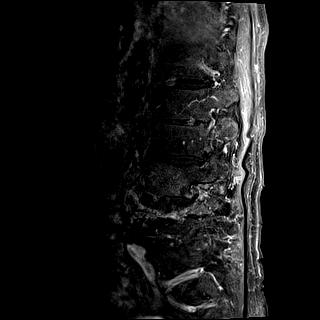
[im 9/18]
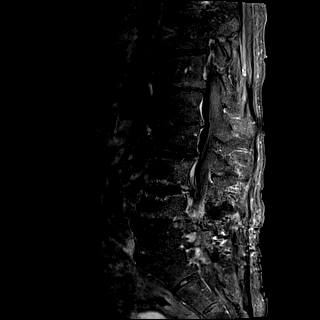
[im 13/18]
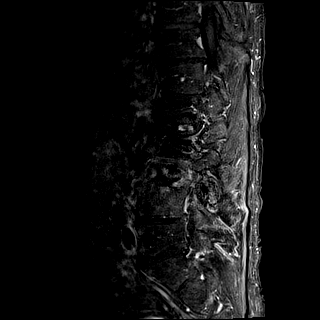
[im 18/18]
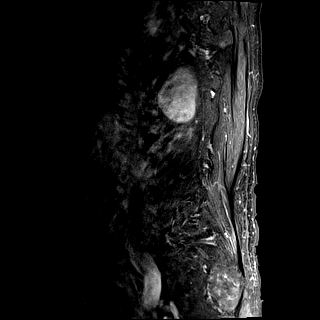

[Series 9: T1 post-contrast · axial · 4.0mm · 0.78mm/px · z∈[-118,+55]mm · 9 of 34 slices shown]
[im 1/34]
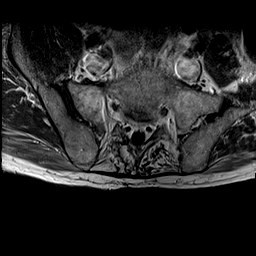
[im 5/34]
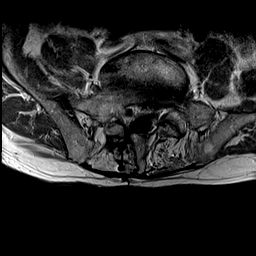
[im 9/34]
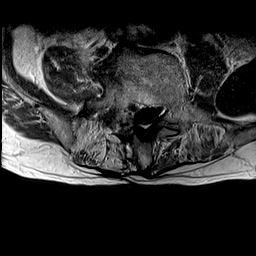
[im 13/34]
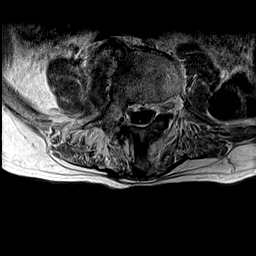
[im 17/34]
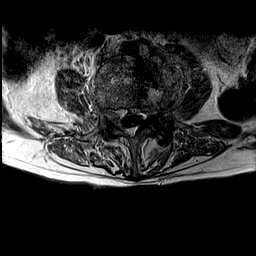
[im 21/34]
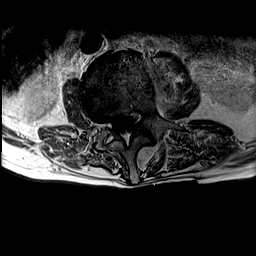
[im 25/34]
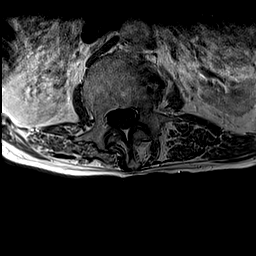
[im 29/34]
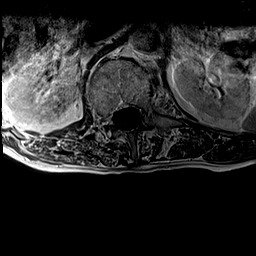
[im 34/34]
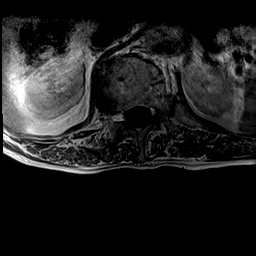

[48 of 48 positions shown; findings below may reference images not displayed]

FINDINGS: Segmentation:  Standard.

Alignment: No static listhesis. Dextroscoliosis of the lumbar spine.

Vertebrae:  No fracture, evidence of discitis, or bone lesion.

Conus medullaris and cauda equina: Conus extends to the T12 level.
Conus and cauda equina appear normal.

Paraspinal and other soft tissues: No acute paraspinal abnormality.

Disc levels:

Disc spaces: Degenerative disease with disc height loss throughout
the lumbar spine most severe at L3-4 with severe reactive endplate
changes.

T11-12: Mild broad-based disc bulge. Mild bilateral facet
arthropathy. No foraminal stenosis.

T12-L1: Mild broad-based disc bulge. Mild left foraminal stenosis.
No right foraminal stenosis. No evidence of neural foraminal
stenosis. No central canal stenosis.

L1-L2: Broad-based disc bulge. Mild left foraminal stenosis. No
right foraminal stenosis. No central canal stenosis.

L2-L3: Mild broad-based disc bulge. Mild bilateral facet
arthropathy. No evidence of neural foraminal stenosis. No central
canal stenosis.

L3-L4: Broad-based disc osteophyte complex eccentric towards the
right. Moderate bilateral facet arthropathy. Right lateral recess
stenosis. Moderate left foraminal stenosis. Moderate right foraminal
stenosis. Mild spinal stenosis.

L4-L5: Broad-based disc osteophyte complex. Right laminotomy defect.
Moderate bilateral facet arthropathy. Severe right foraminal
stenosis. No left foraminal stenosis. Right lateral recess stenosis.

L5-S1: Broad-based disc bulge. Right laminotomy defect. Mild
bilateral facet arthropathy. Moderate right foraminal stenosis. No
central canal stenosis.
IMPRESSION: 1. Diffuse lumbar spine spondylosis as described above similar in
appearance to the prior examination of [DATE].

## 2019-03-10 MED ORDER — GADOBENATE DIMEGLUMINE 529 MG/ML IV SOLN
14.0000 mL | Freq: Once | INTRAVENOUS | Status: AC | PRN
  Administered 2019-03-10: 14 mL via INTRAVENOUS

## 2019-03-16 ENCOUNTER — Other Ambulatory Visit: Payer: Self-pay | Admitting: Neurology

## 2019-03-22 NOTE — Telephone Encounter (Signed)
I made a referral to [facility] on 17 February 2019, the patient has not heard anything yet.  I will make another referral.

## 2019-03-22 NOTE — Addendum Note (Signed)
Addended by: Kathrynn Ducking on: 03/22/2019 03:38 PM   Modules accepted: Orders

## 2019-03-22 NOTE — Telephone Encounter (Signed)
FYI Pt has called to inform Dr Jannifer Franklin that he is no longer [facility] to [facility] and he is still needing to be referred to someone to take his place

## 2019-03-31 ENCOUNTER — Telehealth: Payer: Self-pay | Admitting: Neurology

## 2019-03-31 NOTE — Telephone Encounter (Signed)
[email or web address] states pt is scheduled for 10-05 with Dr Corinna Capra @ 10:15 this is FYI

## 2019-04-01 ENCOUNTER — Telehealth: Payer: Self-pay | Admitting: Neurology

## 2019-04-01 MED ORDER — HYDROCODONE-ACETAMINOPHEN 5-325 MG PO TABS: 1.0000 | ORAL_TABLET | Freq: Four times a day (QID) | ORAL | 0 refills | Status: DC | PRN

## 2019-04-01 MED ORDER — OXYCODONE HCL ER 40 MG PO T12A: 40.0000 mg | EXTENDED_RELEASE_TABLET | Freq: Three times a day (TID) | ORAL | 0 refills | Status: DC

## 2019-04-01 NOTE — Telephone Encounter (Signed)
Pt is needing a refill on his HYDROcodone-acetaminophen (NORCO/VICODIN) 5-325 MG tablet and oxyCODONE (OXYCONTIN) 40 mg 12 hr [facility] [place]

## 2019-04-01 NOTE — Telephone Encounter (Signed)
[place] drug registry verified. Last refill for oxycontin was 03/01/2019# 90 for a 30 day supply by Dr. Jannifer Franklin.  Last refill for hydrocodone was 03/01/2019 # 30 by Dr. Jannifer Franklin as well.

## 2019-04-13 ENCOUNTER — Telehealth: Payer: Self-pay | Admitting: Neurology

## 2019-04-13 MED ORDER — PREGABALIN 200 MG PO CAPS: 200.0000 mg | ORAL_CAPSULE | Freq: Three times a day (TID) | ORAL | 1 refills | Status: DC

## 2019-04-13 NOTE — Telephone Encounter (Signed)
The Lyrica prescription was sent in.

## 2019-04-13 NOTE — Telephone Encounter (Signed)
Pt is needing a refill on his pregabalin (LYRICA) 200 MG capsule sent to the [facility] in [place]

## 2019-04-13 NOTE — Telephone Encounter (Signed)
[place] drug registry has been verified. Last refill of Lyrica was 12/20/2018 # 270 for a 90 day supply.

## 2019-05-03 ENCOUNTER — Telehealth: Payer: Self-pay

## 2019-05-03 MED ORDER — HYDROCODONE-ACETAMINOPHEN 5-325 MG PO TABS: 1.0000 | ORAL_TABLET | Freq: Four times a day (QID) | ORAL | 0 refills | Status: DC | PRN

## 2019-05-03 MED ORDER — OXYCODONE HCL ER 40 MG PO T12A: 40.0000 mg | EXTENDED_RELEASE_TABLET | Freq: Three times a day (TID) | ORAL | 0 refills | Status: DC

## 2019-05-03 NOTE — Telephone Encounter (Signed)
I called the patient.  We had made a referral to [facility], if he never heard anything, let me know and I will set up a referral to [facility] pain center.  I will send in the prescriptions for the OxyContin and hydrocodone.

## 2019-05-03 NOTE — Telephone Encounter (Signed)
Patient called and requested a script for Hydrocodone and Oxycodone be [facility] to [facility] [place]. Please call and advise.

## 2019-05-03 NOTE — Telephone Encounter (Signed)
Patient called back and stated he has an apt with pain clinic on Oct 5th, I relayed the message below he was appreciative and no return call necessary.

## 2019-05-03 NOTE — Telephone Encounter (Signed)
[place] drug registry has been verified. Last refill for OxyContin was 04/01/19 # 65 by Dr. Felecia Shelling. Last refill for Hydrocodone was 04/01/19 # 30 by Dr. Felecia Shelling as well.

## 2019-06-30 ENCOUNTER — Other Ambulatory Visit: Payer: Self-pay | Admitting: Neurology

## 2019-07-12 ENCOUNTER — Other Ambulatory Visit: Payer: Self-pay

## 2019-07-12 ENCOUNTER — Emergency Department (HOSPITAL_COMMUNITY)
Admission: EM | Admit: 2019-07-12 | Discharge: 2019-07-12 | Disposition: A | Discharge status: Home / Self Care | Payer: Medicare Other | Source: Home / Self Care | Attending: Emergency Medicine | Admitting: Emergency Medicine

## 2019-07-12 ENCOUNTER — Emergency Department (HOSPITAL_COMMUNITY): Payer: Medicare Other

## 2019-07-12 ENCOUNTER — Encounter (HOSPITAL_COMMUNITY): Payer: Self-pay

## 2019-07-12 DIAGNOSIS — G9341 Metabolic encephalopathy: Secondary | ICD-10-CM | POA: Diagnosis not present

## 2019-07-12 DIAGNOSIS — Z79899 Other long term (current) drug therapy: Secondary | ICD-10-CM | POA: Insufficient documentation

## 2019-07-12 DIAGNOSIS — K8020 Calculus of gallbladder without cholecystitis without obstruction: Secondary | ICD-10-CM | POA: Insufficient documentation

## 2019-07-12 DIAGNOSIS — I2510 Atherosclerotic heart disease of native coronary artery without angina pectoris: Secondary | ICD-10-CM | POA: Insufficient documentation

## 2019-07-12 DIAGNOSIS — R4182 Altered mental status, unspecified: Secondary | ICD-10-CM | POA: Diagnosis not present

## 2019-07-12 LAB — CBC WITH DIFFERENTIAL/PLATELET
Abs Immature Granulocytes: 0.07 10*3/uL (ref 0.00–0.07)
Basophils Absolute: 0 10*3/uL (ref 0.0–0.1)
Basophils Relative: 0 %
Eosinophils Absolute: 0 10*3/uL (ref 0.0–0.5)
Eosinophils Relative: 0 %
HCT: 37.2 % — ABNORMAL LOW (ref 39.0–52.0)
Hemoglobin: 13.4 g/dL (ref 13.0–17.0)
Immature Granulocytes: 1 %
Lymphocytes Relative: 8 %
Lymphs Abs: 0.6 10*3/uL — ABNORMAL LOW (ref 0.7–4.0)
MCH: 33.7 pg (ref 26.0–34.0)
MCHC: 36 g/dL (ref 30.0–36.0)
MCV: 93.5 fL (ref 80.0–100.0)
Monocytes Absolute: 0.3 10*3/uL (ref 0.1–1.0)
Monocytes Relative: 4 %
Neutro Abs: 6.5 10*3/uL (ref 1.7–7.7)
Neutrophils Relative %: 87 %
Platelets: 111 10*3/uL — ABNORMAL LOW (ref 150–400)
RBC: 3.98 MIL/uL — ABNORMAL LOW (ref 4.22–5.81)
RDW: 13.3 % (ref 11.5–15.5)
WBC: 7.5 10*3/uL (ref 4.0–10.5)
nRBC: 0 % (ref 0.0–0.2)

## 2019-07-12 LAB — COMPREHENSIVE METABOLIC PANEL
ALT: 16 U/L (ref 0–44)
AST: 27 U/L (ref 15–41)
Albumin: 4.2 g/dL (ref 3.5–5.0)
Alkaline Phosphatase: 90 U/L (ref 38–126)
Anion gap: 9 (ref 5–15)
BUN: 18 mg/dL (ref 8–23)
CO2: 23 mmol/L (ref 22–32)
Calcium: 9.5 mg/dL (ref 8.9–10.3)
Chloride: 105 mmol/L (ref 98–111)
Creatinine, Ser: 0.64 mg/dL (ref 0.61–1.24)
GFR calc Af Amer: >60 mL/min (ref >60)
GFR calc non Af Amer: >60 mL/min (ref >60)
Glucose, Bld: 130 mg/dL — ABNORMAL HIGH (ref 70–99)
Potassium: 3.9 mmol/L (ref 3.5–5.1)
Sodium: 137 mmol/L (ref 135–145)
Total Bilirubin: 1.4 mg/dL — ABNORMAL HIGH (ref 0.3–1.2)
Total Protein: 7.1 g/dL (ref 6.5–8.1)

## 2019-07-12 LAB — LIPASE, BLOOD: Lipase: 17 U/L (ref 11–51)

## 2019-07-12 IMAGING — CT CT ABD-PELV W/ CM
2 of 5 series · 15 of 46 positions shown, 17 images · IV contrast (OMNIPAQUE 300)
Comparison: [DATE]

CLINICAL DATA: Abdominal pain. Right upper quadrant abdominal pain
with low back pain.

EXAM:
CT ABDOMEN AND PELVIS WITH CONTRAST
TECHNIQUE: Multidetector CT imaging of the abdomen and pelvis was performed
using the standard protocol following bolus administration of
intravenous contrast.
CONTRAST:  100mL OMNIPAQUE IOHEXOL 300 MG/ML  SOLN

[Series 3: axial st · axial · 0.89mm/px · z∈[-360,+40]mm · 12 of 96 slices shown, 14 images]
[im 8/96  soft-tissue]
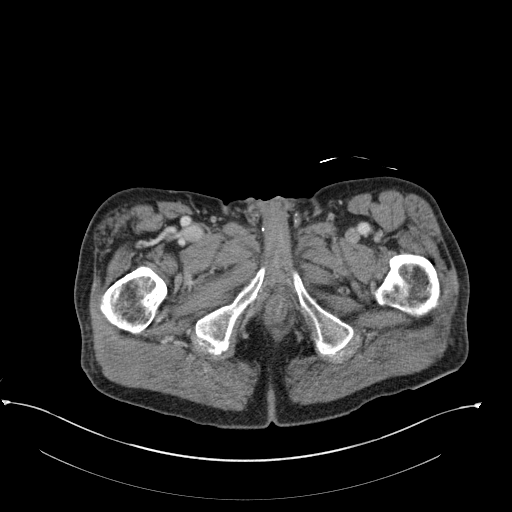
[im 8/96  bone]
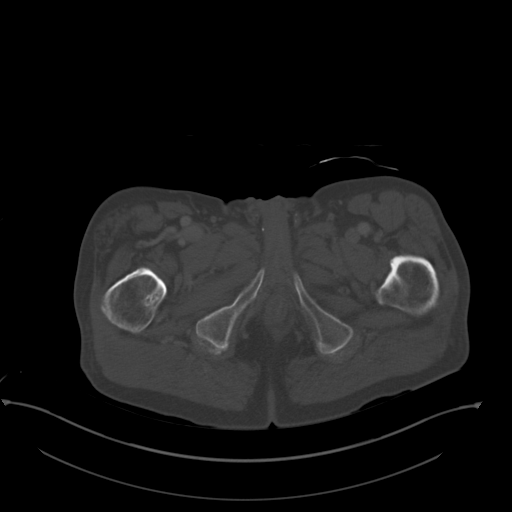
[im 15/96  soft-tissue]
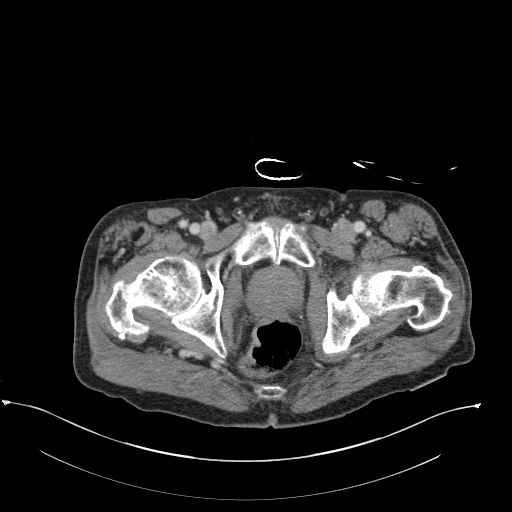
[im 22/96  soft-tissue]
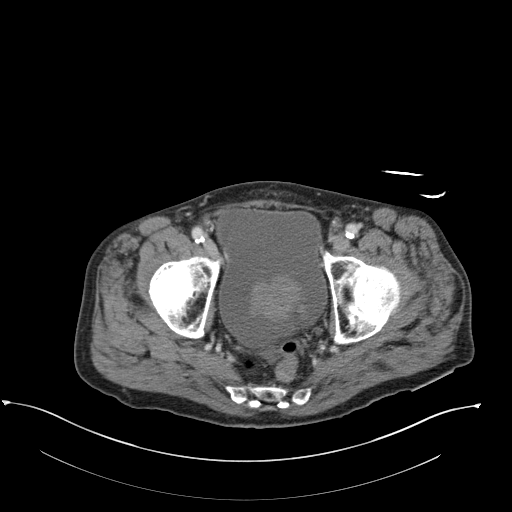
[im 30/96  soft-tissue]
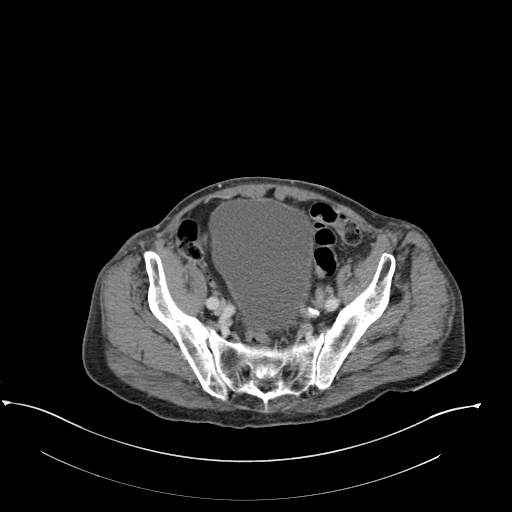
[im 37/96  soft-tissue]
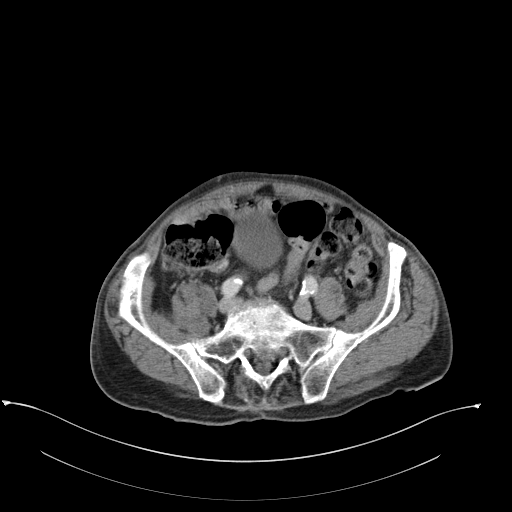
[im 44/96  soft-tissue]
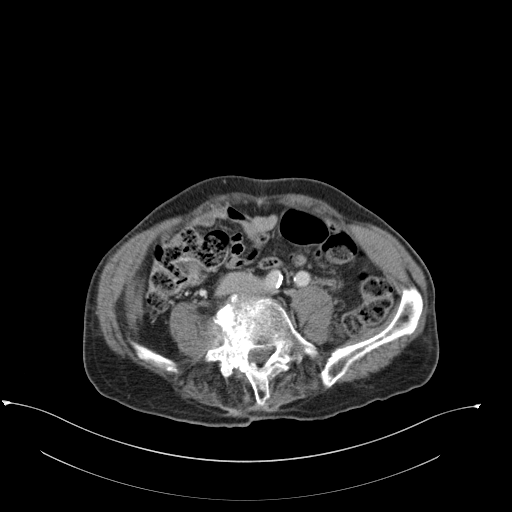
[im 52/96  soft-tissue]
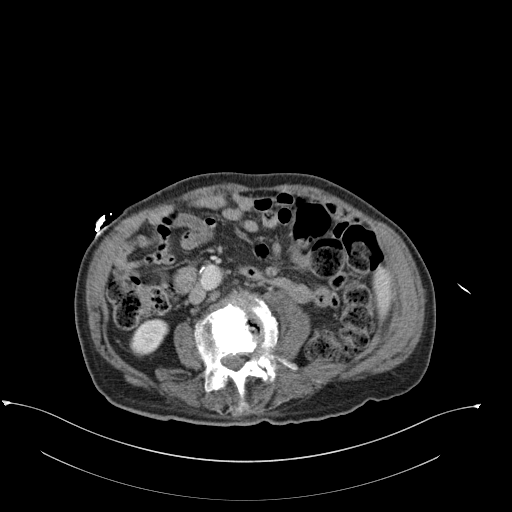
[im 59/96  soft-tissue]
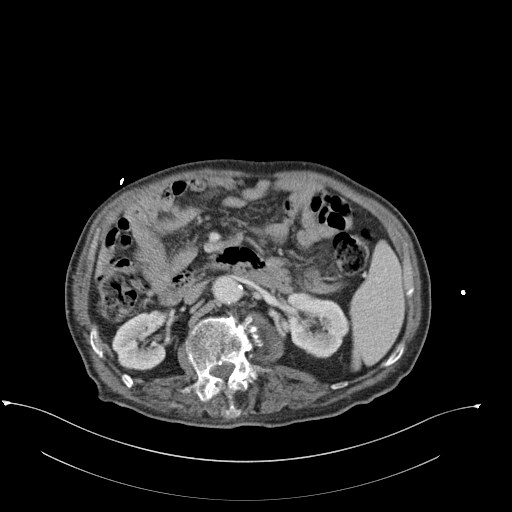
[im 66/96  soft-tissue]
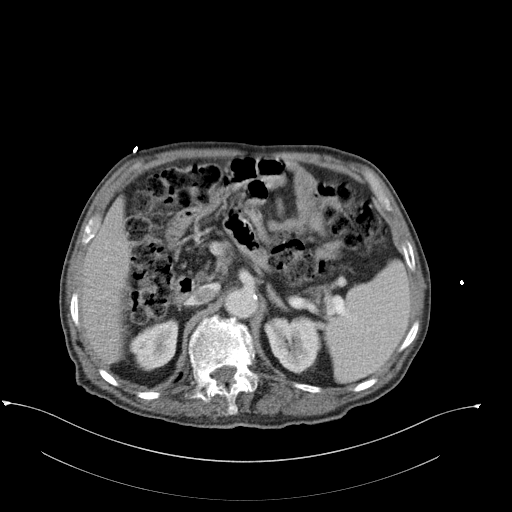
[im 66/96  bone]
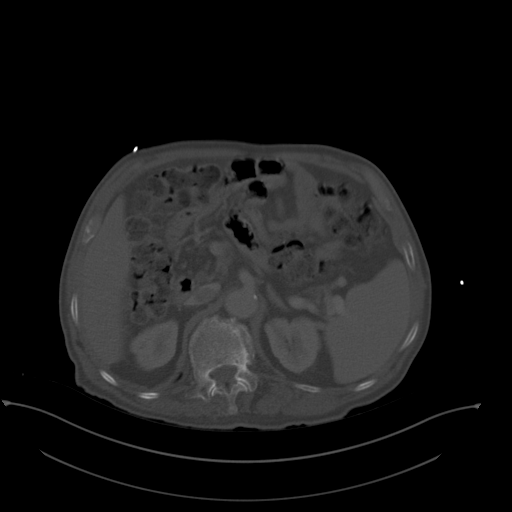
[im 74/96  soft-tissue]
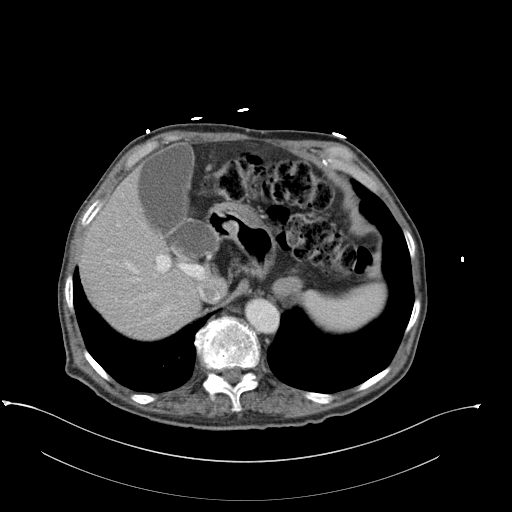
[im 81/96  soft-tissue]
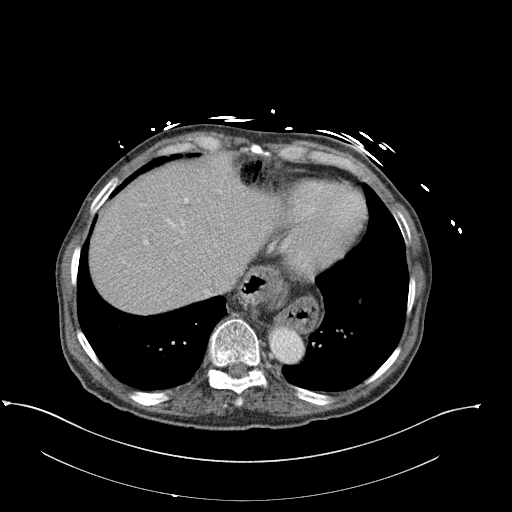
[im 88/96  soft-tissue]
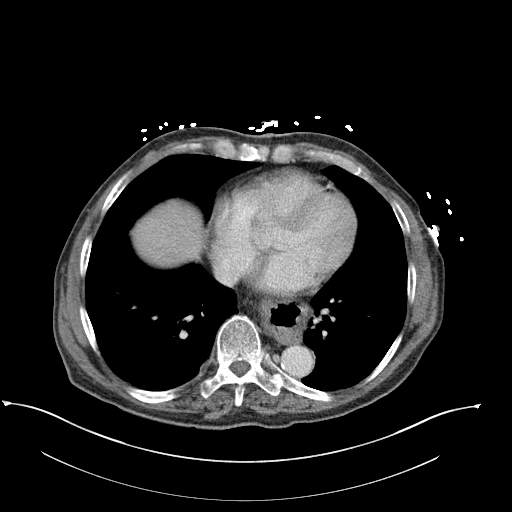

[Series 4: coronal st · coronal · 0.95mm/px · 3 of 130 slices shown]
[im 44/130  soft-tissue]
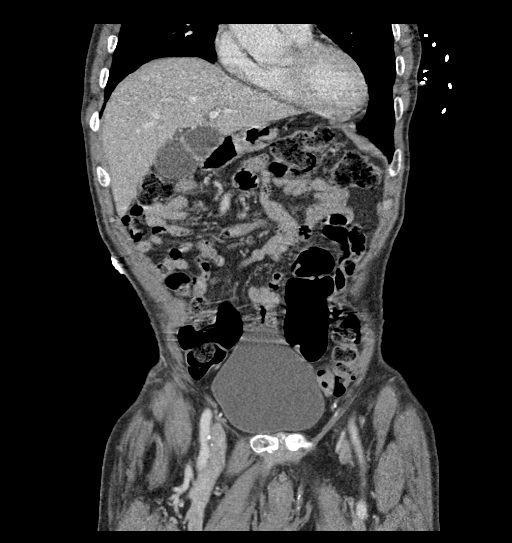
[im 58/130  soft-tissue]
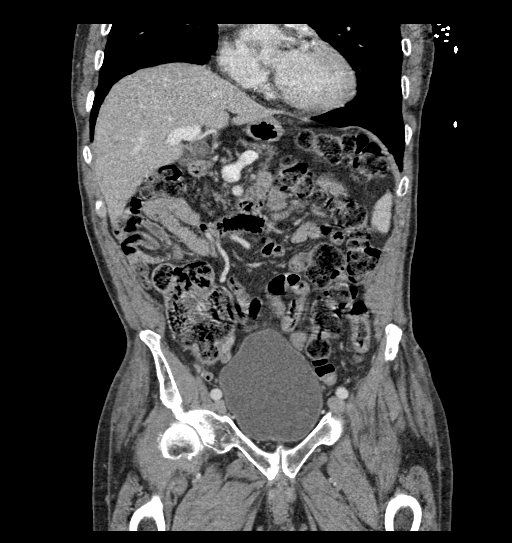
[im 72/130  soft-tissue]
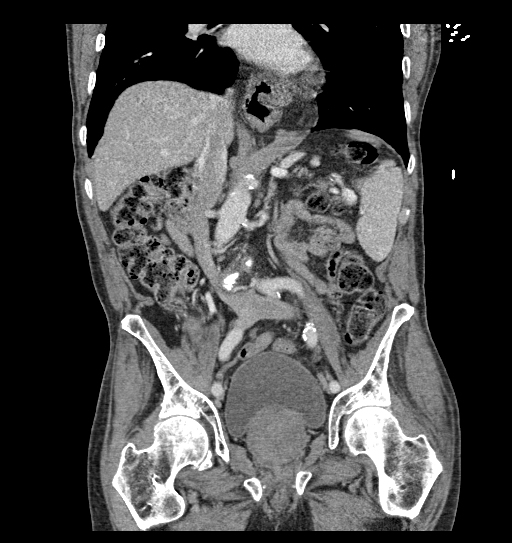

[15 of 46 positions shown; findings below may reference images not displayed]

FINDINGS: Lower chest: The lung bases are clear. The heart size is normal.

Hepatobiliary: The liver is normal. The gallbladder is distended.
There is cholelithiasis.There is no biliary ductal dilation.

Pancreas: The pancreas is atrophic.

Spleen: The spleen is borderline enlarged measuring approximately
12.5 cm craniocaudad. The splenic vein is patent.

Adrenals/Urinary Tract:

--Adrenal glands: No adrenal hemorrhage.

--Right kidney/ureter: No hydronephrosis or perinephric hematoma.

--Left kidney/ureter: No hydronephrosis or perinephric hematoma.

--Urinary bladder: The urinary bladder is distended.

Stomach/Bowel:

--Stomach/Duodenum: There is a large hiatal hernia.

--Small bowel: No dilatation or inflammation.

--Colon: No focal abnormality.

--Appendix: Normal.

Vascular/Lymphatic: Atherosclerotic calcification is present within
the non-aneurysmal abdominal aorta, without hemodynamically
significant stenosis.

--No retroperitoneal lymphadenopathy.

--No mesenteric lymphadenopathy.

--No pelvic or inguinal lymphadenopathy.

Reproductive: The prostate gland is enlarged.

Other: No ascites or free air. The abdominal wall is normal.

Musculoskeletal. There are advanced degenerative changes throughout
the visualized lumbar spine, greatest at the L3-L4 level. There is a
degenerative dextroscoliosis centered at the L2-L3 level. There is
no displaced fracture.
IMPRESSION: 1. Distended gallbladder with cholelithiasis. No biliary ductal
dilation. No CT evidence for acute cholecystitis.
2. Borderline splenomegaly.
3. Large hiatal hernia.
4. Aortic atherosclerosis.

Aortic Atherosclerosis ([2W]-[2W]).

## 2019-07-12 MED ORDER — MORPHINE SULFATE (PF) 4 MG/ML IV SOLN
4.0000 mg | Freq: Once | INTRAVENOUS | Status: AC
  Administered 2019-07-12: 4 mg via INTRAVENOUS
  Filled 2019-07-12: qty 1

## 2019-07-12 MED ORDER — SODIUM CHLORIDE 0.9 % IV BOLUS
500.0000 mL | Freq: Once | INTRAVENOUS | Status: AC
  Administered 2019-07-12: 500 mL via INTRAVENOUS

## 2019-07-12 MED ORDER — SODIUM CHLORIDE (PF) 0.9 % IJ SOLN
INTRAMUSCULAR | Status: AC
  Filled 2019-07-12: qty 50

## 2019-07-12 MED ORDER — IOHEXOL 300 MG/ML  SOLN
100.0000 mL | Freq: Once | INTRAMUSCULAR | Status: AC | PRN
  Administered 2019-07-12: 100 mL via INTRAVENOUS

## 2019-07-12 MED ORDER — MORPHINE SULFATE (PF) 4 MG/ML IV SOLN
INTRAVENOUS | Status: AC
  Filled 2019-07-12: qty 1

## 2019-07-12 MED ORDER — ONDANSETRON HCL 4 MG/2ML IJ SOLN
4.0000 mg | Freq: Once | INTRAMUSCULAR | Status: AC
  Administered 2019-07-12: 4 mg via INTRAVENOUS
  Filled 2019-07-12: qty 2

## 2019-07-12 NOTE — ED Provider Notes (Signed)
[facility] DEPT Provider Note   CSN: [id number] Arrival date & time: 07/12/19  1224     History   Chief Complaint Chief Complaint  Patient presents with  . Abdominal Pain  . Back Pain  . Nausea  . Emesis    HPI Riley Eaton is a 78 y.o. male.     Patient is a 78 year old male with past medical history of thoracic aortic aneurysm, prior pancreatic stent placement, and cholelithiasis.  Patient was to have his gallbladder removed this past summer, however this was postponed secondary to Covid.  Patient presents today with complaints of severe pain in his right upper quadrant and epigastric region.  This is consistent with his gallbladder pain.  He denies fevers or chills.  He denies any diarrhea, but does report nausea and vomiting that started yesterday evening.  The history is provided by the patient.  Abdominal Pain Pain location:  Epigastric and RUQ Pain quality: stabbing   Pain radiates to:  Does not radiate Pain severity:  Severe Onset quality:  Sudden Duration:  2 days Timing:  Constant Progression:  Worsening Chronicity:  Recurrent Relieved by:  Nothing Worsened by:  Movement and palpation Associated symptoms: vomiting   Back Pain Associated symptoms: abdominal pain   Emesis Associated symptoms: abdominal pain     Past Medical History:  Diagnosis Date  . Chronic back pain   . 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 Chronic, continuous use of opioids    for back pain  . Coronary artery calcification seen on CT scan   . Nerve pain   . Thoracic aortic aneurysm [facility])     Patient Active Problem List   Diagnosis Date Noted  . Acute metabolic encephalopathy [id number]  . Protein-calorie malnutrition, severe 09/28/2018  . Nausea & vomiting 09/25/2018  . Choledocholithiasis 09/25/2018  . Chronic pain 09/25/2018  . Urinary retention 09/25/2018  . Constipation 09/25/2018  . Intractable vomiting   . Chronic low back pain  05/19/2017  . Thoracic aortic aneurysm ([place]) 10/03/2015  . Coronary artery calcification seen on CT scan 10/03/2015    Past Surgical History:  Procedure Laterality Date  . BACK SURGERY     09-2016  . back surgey    . ENDOSCOPIC RETROGRADE CHOLANGIOPANCREATOGRAPHY (ERCP) WITH PROPOFOL N/A 09/25/2018   Procedure: ENDOSCOPIC RETROGRADE CHOLANGIOPANCREATOGRAPHY (ERCP) WITH PROPOFOL;  Surgeon: Ronnette Juniper, MD;  Location: [facility];  Service: Gastroenterology;  Laterality: N/A;  . PANCREATIC STENT PLACEMENT  09/25/2018   Procedure: PANCREATIC STENT PLACEMENT;  Surgeon: Ronnette Juniper, MD;  Location: [facility] ENDOSCOPY;  Service: Gastroenterology;;  . REMOVAL OF STONES  09/25/2018   Procedure: REMOVAL OF STONES;  Surgeon: Ronnette Juniper, MD;  Location: [facility];  Service: Gastroenterology;;  . [name]  09/25/2018   Procedure: [name];  Surgeon: Ronnette Juniper, MD;  Location[facility] ENDOSCOPY;  Service: Gastroenterology;;        Home Medications    Prior to Admission medications   Medication Sig Start Date End Date Taking? Authorizing Provider  allopurinol (ZYLOPRIM) 100 MG tablet TAKE 1 TABLET BY MOUTH EVERY DAY 03/16/19   Kathrynn Ducking, MD  bisacodyl (DULCOLAX) 10 MG suppository Place 1 suppository (10 mg total) rectally as needed for moderate constipation. 10/26/17   Varney Biles, MD  calcium-vitamin D (OSCAL WITH D) 500-200 MG-UNIT tablet Take 1 tablet by mouth daily with breakfast.    [provider]  Diphenhydramine-APAP, sleep, (TYLENOL PM EXTRA STRENGTH PO) Take 1 tablet by mouth at bedtime.  [provider]  DULoxetine (CYMBALTA) 60 MG capsule TAKE 1 CAPSULE BY MOUTH TWICE A DAY 07/01/19   Kathrynn Ducking, MD  HYDROcodone-acetaminophen (NORCO/VICODIN) 5-325 MG tablet Take 1 tablet by mouth every 6 (six) hours as needed for moderate pain. 05/03/19   Kathrynn Ducking, MD  oxyCODONE (OXYCONTIN) 40 mg 12 hr tablet Take 1 tablet (40 mg total) by mouth 3 (three)  times daily. 05/03/19   Kathrynn Ducking, MD  pregabalin (LYRICA) 200 MG capsule Take 1 capsule (200 mg total) by mouth 3 (three) times daily. 04/13/19   Kathrynn Ducking, MD  tamsulosin (FLOMAX) 0.4 MG CAPS capsule Take 0.4 mg by mouth daily.  08/29/15   [provider]    Family History Family History  Problem Relation Age of Onset  . Cancer Mother   . Heart attack Father     Social History Social History   Tobacco Use  . Smoking status: Never Smoker  . Smokeless tobacco: Never Used  Substance Use Topics  . Alcohol use: No  . Drug use: No     Allergies   Patient has no known allergies.   Review of Systems Review of Systems  Gastrointestinal: Positive for abdominal pain and vomiting.  Musculoskeletal: Positive for back pain.  All other systems reviewed and are negative.    Physical Exam Updated Vital Signs BP (!) 152/92   Pulse 78   Temp 97.6 F (36.4 C) (Oral)   Resp 18   SpO2 100%   Physical Exam Vitals signs and nursing note reviewed.  Constitutional:      General: He is not in acute distress.    Appearance: He is well-developed. He is not diaphoretic.     Comments: Patient is a 78 year old male who appears uncomfortable.  HENT:     Head: Normocephalic and atraumatic.  Neck:     Musculoskeletal: Normal range of motion and neck supple.  Cardiovascular:     Rate and Rhythm: Normal rate and regular rhythm.     Heart sounds: No murmur. No friction rub.  Pulmonary:     Effort: Pulmonary effort is normal. No respiratory distress.     Breath sounds: Normal breath sounds. No wheezing or rales.  Abdominal:     General: Bowel sounds are normal. There is no distension.     Palpations: Abdomen is soft.     Tenderness: There is abdominal tenderness in the right upper quadrant and epigastric area. There is no right CVA tenderness, left CVA tenderness, guarding or rebound.  Musculoskeletal: Normal range of motion.  Skin:    General: Skin is warm and dry.   Neurological:     Mental Status: He is alert and oriented to person, place, and time.     Coordination: Coordination normal.      ED Treatments / Results  Labs (all labs ordered are listed, but only abnormal results are displayed) Labs Reviewed  COMPREHENSIVE METABOLIC PANEL  CBC WITH DIFFERENTIAL/PLATELET  LIPASE, BLOOD    EKG None  Radiology No results found.  Procedures Procedures (including critical care time)  Medications Ordered in ED Medications  sodium chloride 0.9 % bolus 500 mL (has no administration in time range)  ondansetron (ZOFRAN) injection 4 mg (has no administration in time range)  morphine 4 MG/ML injection 4 mg (has no administration in time range)     Initial Impression / Assessment and Plan / ED Course  I have reviewed the triage vital signs and the nursing notes.  Pertinent labs & imaging results that were available during my care of the patient were reviewed by me and considered in my medical decision making (see chart for details).  CT Pending.  Care signed out to Dr. Gilford Raid at shift change.  She will obtain the results of the CT scan and determine the final disposition.    Final Clinical Impressions(s) / ED Diagnoses   Final diagnoses:  None    ED Discharge Orders    None       Veryl Speak, MD 07/13/19 [phone]

## 2019-07-12 NOTE — ED Triage Notes (Signed)
EMS reports from home, Pt c/o right upper abdominal pain, low back pain radiating bilaterally, nausea and vomiting starting last night. Pt states it feels similar to prior gallbladder issues.  BP 160/100 HR 80 RR 24 Sp02 100 RA CBG 153  20ga LAC 4mg  Zofran

## 2019-07-12 NOTE — Discharge Instructions (Addendum)
Call the surgeon's office in the morning to schedule an appointment.  Let them know you were seen in the emergency room today.

## 2019-07-12 NOTE — ED Provider Notes (Signed)
Pt signed out by Dr. Stark Jock pending CT results:    IMPRESSION:  1. Distended gallbladder with cholelithiasis. No biliary ductal  dilation. No CT evidence for acute cholecystitis.  2. Borderline splenomegaly.  3. Large hiatal hernia.  4. Aortic atherosclerosis.   Pt d/w Dr. Lucia Gaskins who recommended outpatient follow up.  Pt's son in the room and understands plan as well.  Pt is agreeable with this plan.  Pt is to call the surgery office tomorrow to be seen.  Pt knows to return if worse.    Isla Pence, MD 07/12/19 1725

## 2019-07-13 ENCOUNTER — Emergency Department (HOSPITAL_COMMUNITY): Payer: Medicare Other

## 2019-07-13 ENCOUNTER — Inpatient Hospital Stay (HOSPITAL_COMMUNITY)
Admission: EM | Admit: 2019-07-13 | Discharge: 2019-08-02 | DRG: 070 | Disposition: A | Discharge status: Skilled Nursing Facility | Payer: Medicare Other | Attending: Internal Medicine | Admitting: Internal Medicine

## 2019-07-13 ENCOUNTER — Encounter (HOSPITAL_COMMUNITY): Payer: Self-pay | Admitting: Emergency Medicine

## 2019-07-13 DIAGNOSIS — Z9911 Dependence on respirator [ventilator] status: Secondary | ICD-10-CM | POA: Diagnosis not present

## 2019-07-13 DIAGNOSIS — R451 Restlessness and agitation: Secondary | ICD-10-CM

## 2019-07-13 DIAGNOSIS — I2510 Atherosclerotic heart disease of native coronary artery without angina pectoris: Secondary | ICD-10-CM | POA: Diagnosis present

## 2019-07-13 DIAGNOSIS — I5023 Acute on chronic systolic (congestive) heart failure: Secondary | ICD-10-CM | POA: Diagnosis not present

## 2019-07-13 DIAGNOSIS — Z0189 Encounter for other specified special examinations: Secondary | ICD-10-CM

## 2019-07-13 DIAGNOSIS — I712 Thoracic aortic aneurysm, without rupture: Secondary | ICD-10-CM | POA: Diagnosis present

## 2019-07-13 DIAGNOSIS — N139 Obstructive and reflux uropathy, unspecified: Secondary | ICD-10-CM | POA: Diagnosis not present

## 2019-07-13 DIAGNOSIS — N4889 Other specified disorders of penis: Secondary | ICD-10-CM | POA: Diagnosis not present

## 2019-07-13 DIAGNOSIS — N1330 Unspecified hydronephrosis: Secondary | ICD-10-CM | POA: Diagnosis not present

## 2019-07-13 DIAGNOSIS — I110 Hypertensive heart disease with heart failure: Secondary | ICD-10-CM | POA: Diagnosis present

## 2019-07-13 DIAGNOSIS — E876 Hypokalemia: Secondary | ICD-10-CM

## 2019-07-13 DIAGNOSIS — Z781 Physical restraint status: Secondary | ICD-10-CM

## 2019-07-13 DIAGNOSIS — R64 Cachexia: Secondary | ICD-10-CM | POA: Diagnosis present

## 2019-07-13 DIAGNOSIS — R627 Adult failure to thrive: Secondary | ICD-10-CM | POA: Diagnosis present

## 2019-07-13 DIAGNOSIS — Z20828 Contact with and (suspected) exposure to other viral communicable diseases: Secondary | ICD-10-CM | POA: Diagnosis present

## 2019-07-13 DIAGNOSIS — K8050 Calculus of bile duct without cholangitis or cholecystitis without obstruction: Secondary | ICD-10-CM | POA: Diagnosis not present

## 2019-07-13 DIAGNOSIS — G8929 Other chronic pain: Secondary | ICD-10-CM | POA: Diagnosis not present

## 2019-07-13 DIAGNOSIS — E870 Hyperosmolality and hypernatremia: Secondary | ICD-10-CM | POA: Diagnosis not present

## 2019-07-13 DIAGNOSIS — I4581 Long QT syndrome: Secondary | ICD-10-CM | POA: Diagnosis present

## 2019-07-13 DIAGNOSIS — R410 Disorientation, unspecified: Secondary | ICD-10-CM | POA: Diagnosis not present

## 2019-07-13 DIAGNOSIS — Z8249 Family history of ischemic heart disease and other diseases of the circulatory system: Secondary | ICD-10-CM

## 2019-07-13 DIAGNOSIS — N179 Acute kidney failure, unspecified: Secondary | ICD-10-CM | POA: Diagnosis not present

## 2019-07-13 DIAGNOSIS — R638 Other symptoms and signs concerning food and fluid intake: Secondary | ICD-10-CM

## 2019-07-13 DIAGNOSIS — R54 Age-related physical debility: Secondary | ICD-10-CM | POA: Diagnosis present

## 2019-07-13 DIAGNOSIS — F419 Anxiety disorder, unspecified: Secondary | ICD-10-CM | POA: Diagnosis present

## 2019-07-13 DIAGNOSIS — M5440 Lumbago with sciatica, unspecified side: Secondary | ICD-10-CM | POA: Diagnosis not present

## 2019-07-13 DIAGNOSIS — M545 Low back pain, unspecified: Secondary | ICD-10-CM | POA: Diagnosis present

## 2019-07-13 DIAGNOSIS — I214 Non-ST elevation (NSTEMI) myocardial infarction: Secondary | ICD-10-CM | POA: Diagnosis not present

## 2019-07-13 DIAGNOSIS — N401 Enlarged prostate with lower urinary tract symptoms: Secondary | ICD-10-CM | POA: Diagnosis not present

## 2019-07-13 DIAGNOSIS — Z6824 Body mass index (BMI) 24.0-24.9, adult: Secondary | ICD-10-CM

## 2019-07-13 DIAGNOSIS — E872 Acidosis: Secondary | ICD-10-CM | POA: Diagnosis not present

## 2019-07-13 DIAGNOSIS — G9341 Metabolic encephalopathy: Principal | ICD-10-CM | POA: Diagnosis present

## 2019-07-13 DIAGNOSIS — J690 Pneumonitis due to inhalation of food and vomit: Secondary | ICD-10-CM | POA: Diagnosis not present

## 2019-07-13 DIAGNOSIS — I219 Acute myocardial infarction, unspecified: Secondary | ICD-10-CM

## 2019-07-13 DIAGNOSIS — R1011 Right upper quadrant pain: Secondary | ICD-10-CM | POA: Diagnosis not present

## 2019-07-13 DIAGNOSIS — J9601 Acute respiratory failure with hypoxia: Secondary | ICD-10-CM | POA: Diagnosis not present

## 2019-07-13 DIAGNOSIS — K8064 Calculus of gallbladder and bile duct with chronic cholecystitis without obstruction: Secondary | ICD-10-CM | POA: Diagnosis present

## 2019-07-13 DIAGNOSIS — G9340 Encephalopathy, unspecified: Secondary | ICD-10-CM

## 2019-07-13 DIAGNOSIS — R338 Other retention of urine: Secondary | ICD-10-CM | POA: Diagnosis not present

## 2019-07-13 DIAGNOSIS — R29701 NIHSS score 1: Secondary | ICD-10-CM | POA: Diagnosis not present

## 2019-07-13 DIAGNOSIS — R778 Other specified abnormalities of plasma proteins: Secondary | ICD-10-CM | POA: Diagnosis not present

## 2019-07-13 DIAGNOSIS — I639 Cerebral infarction, unspecified: Secondary | ICD-10-CM | POA: Diagnosis not present

## 2019-07-13 DIAGNOSIS — Z978 Presence of other specified devices: Secondary | ICD-10-CM | POA: Diagnosis not present

## 2019-07-13 DIAGNOSIS — I10 Essential (primary) hypertension: Secondary | ICD-10-CM | POA: Diagnosis not present

## 2019-07-13 DIAGNOSIS — Z79899 Other long term (current) drug therapy: Secondary | ICD-10-CM

## 2019-07-13 DIAGNOSIS — N289 Disorder of kidney and ureter, unspecified: Secondary | ICD-10-CM | POA: Diagnosis not present

## 2019-07-13 DIAGNOSIS — R4182 Altered mental status, unspecified: Secondary | ICD-10-CM | POA: Diagnosis present

## 2019-07-13 DIAGNOSIS — R1312 Dysphagia, oropharyngeal phase: Secondary | ICD-10-CM | POA: Diagnosis present

## 2019-07-13 DIAGNOSIS — Z515 Encounter for palliative care: Secondary | ICD-10-CM | POA: Diagnosis not present

## 2019-07-13 DIAGNOSIS — N35912 Unspecified bulbous urethral stricture, male: Secondary | ICD-10-CM | POA: Diagnosis not present

## 2019-07-13 DIAGNOSIS — R29810 Facial weakness: Secondary | ICD-10-CM | POA: Diagnosis not present

## 2019-07-13 DIAGNOSIS — R1013 Epigastric pain: Secondary | ICD-10-CM | POA: Diagnosis not present

## 2019-07-13 DIAGNOSIS — R1084 Generalized abdominal pain: Secondary | ICD-10-CM

## 2019-07-13 DIAGNOSIS — I420 Dilated cardiomyopathy: Secondary | ICD-10-CM

## 2019-07-13 DIAGNOSIS — I249 Acute ischemic heart disease, unspecified: Secondary | ICD-10-CM | POA: Diagnosis present

## 2019-07-13 DIAGNOSIS — Z7189 Other specified counseling: Secondary | ICD-10-CM | POA: Diagnosis not present

## 2019-07-13 DIAGNOSIS — R109 Unspecified abdominal pain: Secondary | ICD-10-CM | POA: Diagnosis not present

## 2019-07-13 DIAGNOSIS — B9562 Methicillin resistant Staphylococcus aureus infection as the cause of diseases classified elsewhere: Secondary | ICD-10-CM | POA: Diagnosis not present

## 2019-07-13 HISTORY — DX: Acute myocardial infarction, unspecified: I21.9

## 2019-07-13 LAB — I-STAT CHEM 8, ED
BUN: 31 mg/dL — ABNORMAL HIGH (ref 8–23)
Calcium, Ion: 1.14 mmol/L — ABNORMAL LOW (ref 1.15–1.40)
Chloride: 111 mmol/L (ref 98–111)
Creatinine, Ser: 0.7 mg/dL (ref 0.61–1.24)
Glucose, Bld: 158 mg/dL — ABNORMAL HIGH (ref 70–99)
HCT: 36 % — ABNORMAL LOW (ref 39.0–52.0)
Hemoglobin: 12.2 g/dL — ABNORMAL LOW (ref 13.0–17.0)
Potassium: 3.2 mmol/L — ABNORMAL LOW (ref 3.5–5.1)
Sodium: 143 mmol/L (ref 135–145)
TCO2: 21 mmol/L — ABNORMAL LOW (ref 22–32)

## 2019-07-13 LAB — POC SARS CORONAVIRUS 2 AG -  ED: SARS Coronavirus 2 Ag: NEGATIVE

## 2019-07-13 LAB — CBC WITH DIFFERENTIAL/PLATELET
Abs Immature Granulocytes: 0.07 10*3/uL (ref 0.00–0.07)
Basophils Absolute: 0 10*3/uL (ref 0.0–0.1)
Basophils Relative: 0 %
Eosinophils Absolute: 0 10*3/uL (ref 0.0–0.5)
Eosinophils Relative: 0 %
HCT: 38.2 % — ABNORMAL LOW (ref 39.0–52.0)
Hemoglobin: 13.7 g/dL (ref 13.0–17.0)
Immature Granulocytes: 1 %
Lymphocytes Relative: 13 %
Lymphs Abs: 0.9 10*3/uL (ref 0.7–4.0)
MCH: 33.7 pg (ref 26.0–34.0)
MCHC: 35.9 g/dL (ref 30.0–36.0)
MCV: 93.9 fL (ref 80.0–100.0)
Monocytes Absolute: 0.6 10*3/uL (ref 0.1–1.0)
Monocytes Relative: 9 %
Neutro Abs: 5.4 10*3/uL (ref 1.7–7.7)
Neutrophils Relative %: 77 %
Platelets: 140 10*3/uL — ABNORMAL LOW (ref 150–400)
RBC: 4.07 MIL/uL — ABNORMAL LOW (ref 4.22–5.81)
RDW: 13.4 % (ref 11.5–15.5)
WBC: 7 10*3/uL (ref 4.0–10.5)
nRBC: 0 % (ref 0.0–0.2)

## 2019-07-13 LAB — COMPREHENSIVE METABOLIC PANEL
ALT: 21 U/L (ref 0–44)
AST: 42 U/L — ABNORMAL HIGH (ref 15–41)
Albumin: 4.5 g/dL (ref 3.5–5.0)
Alkaline Phosphatase: 93 U/L (ref 38–126)
Anion gap: 16 — ABNORMAL HIGH (ref 5–15)
BUN: 28 mg/dL — ABNORMAL HIGH (ref 8–23)
CO2: 18 mmol/L — ABNORMAL LOW (ref 22–32)
Calcium: 10 mg/dL (ref 8.9–10.3)
Chloride: 108 mmol/L (ref 98–111)
Creatinine, Ser: 1.01 mg/dL (ref 0.61–1.24)
GFR calc Af Amer: >60 mL/min (ref >60)
GFR calc non Af Amer: >60 mL/min (ref >60)
Glucose, Bld: 168 mg/dL — ABNORMAL HIGH (ref 70–99)
Potassium: 3.2 mmol/L — ABNORMAL LOW (ref 3.5–5.1)
Sodium: 142 mmol/L (ref 135–145)
Total Bilirubin: 2.1 mg/dL — ABNORMAL HIGH (ref 0.3–1.2)
Total Protein: 7.6 g/dL (ref 6.5–8.1)

## 2019-07-13 LAB — LACTIC ACID, PLASMA
Lactic Acid, Venous: 3.4 mmol/L (ref 0.5–1.9)
Lactic Acid, Venous: 1.2 mmol/L (ref 0.5–1.9)

## 2019-07-13 LAB — SARS CORONAVIRUS 2 BY RT PCR (HOSPITAL ORDER, PERFORMED IN ~~LOC~~ HOSPITAL LAB): SARS Coronavirus 2: NEGATIVE

## 2019-07-13 LAB — TROPONIN I (HIGH SENSITIVITY): Troponin I (High Sensitivity): 17 ng/L (ref <18)

## 2019-07-13 LAB — LIPASE, BLOOD: Lipase: 18 U/L (ref 11–51)

## 2019-07-13 IMAGING — US US ABDOMEN LIMITED
1 series · 14 of 25 positions shown · non-contrast
Comparison: Right upper quadrant abdominal ultrasound [DATE].

CLINICAL DATA: 78-year-old male with history of abdominal pain.

EXAM:
ULTRASOUND ABDOMEN LIMITED RIGHT UPPER QUADRANT

[Series 1: us abdomen limited · 14 of 33 slices shown]
[im 1/33]
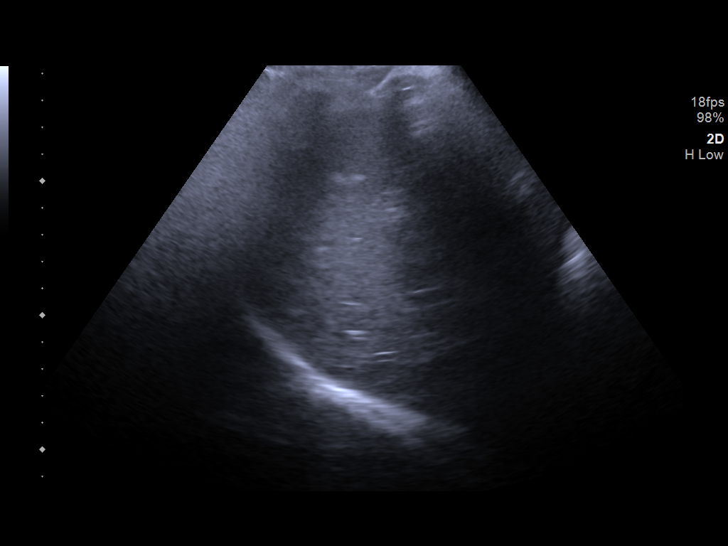
[im 3/33]
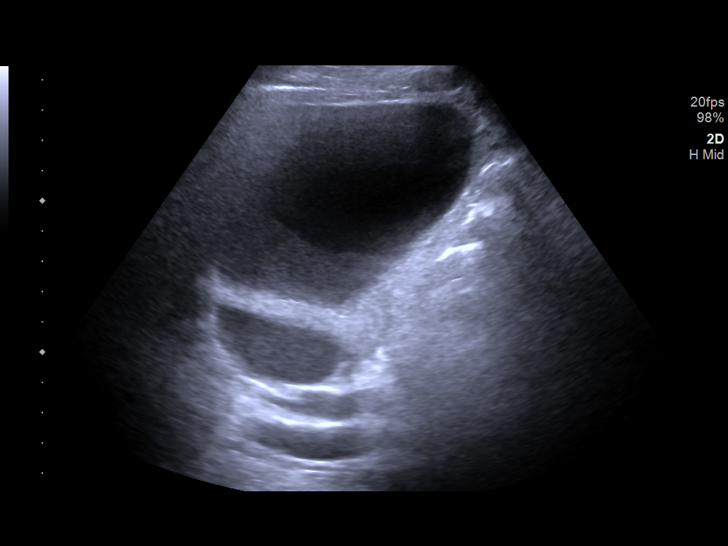
[im 6/33]
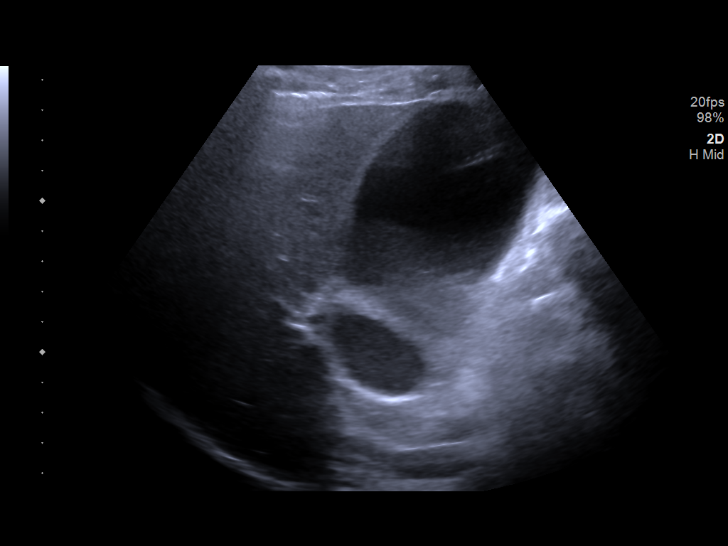
[im 9/33]
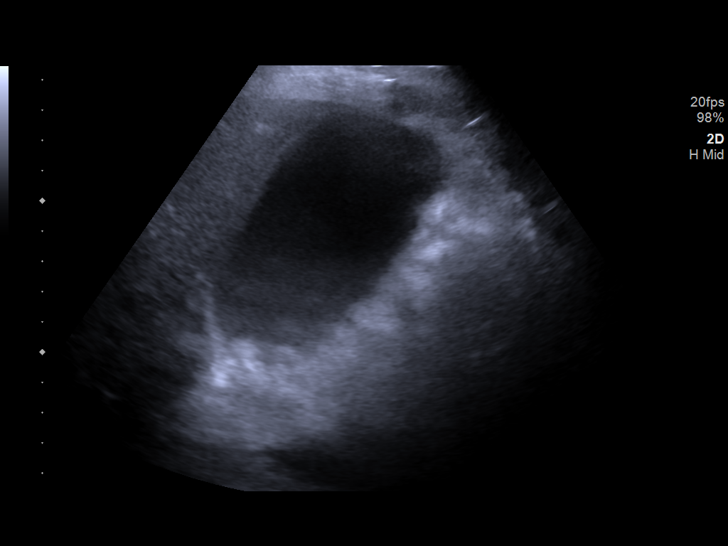
[im 11/33]
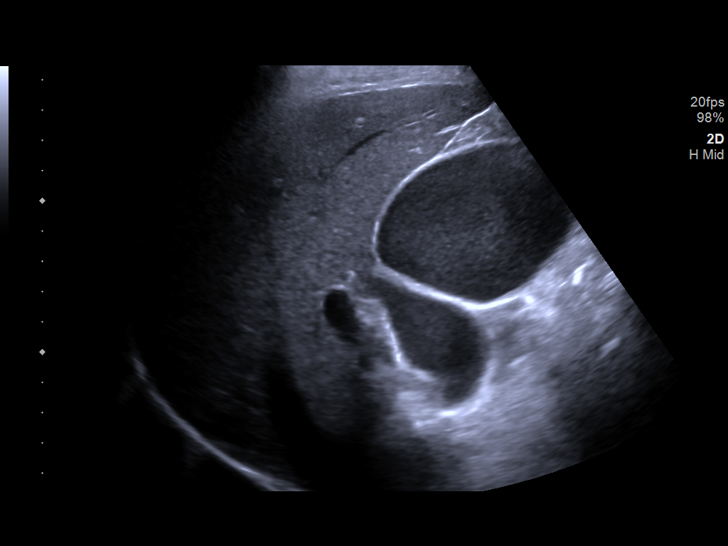
[im 13/33]
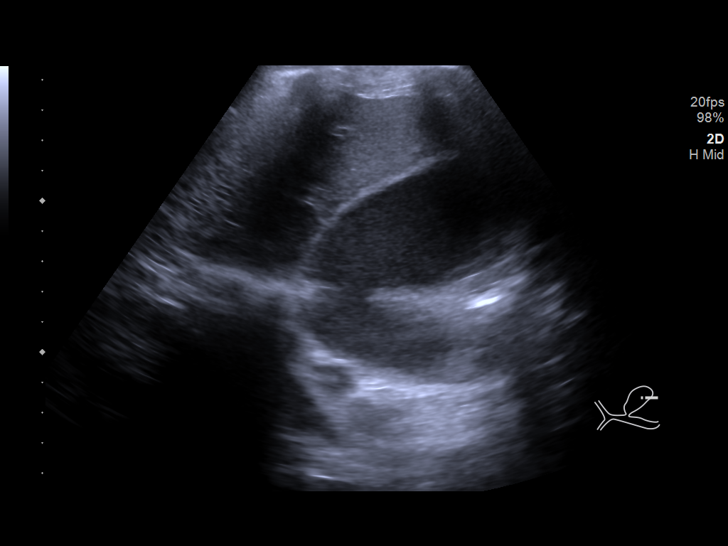
[im 15/33]
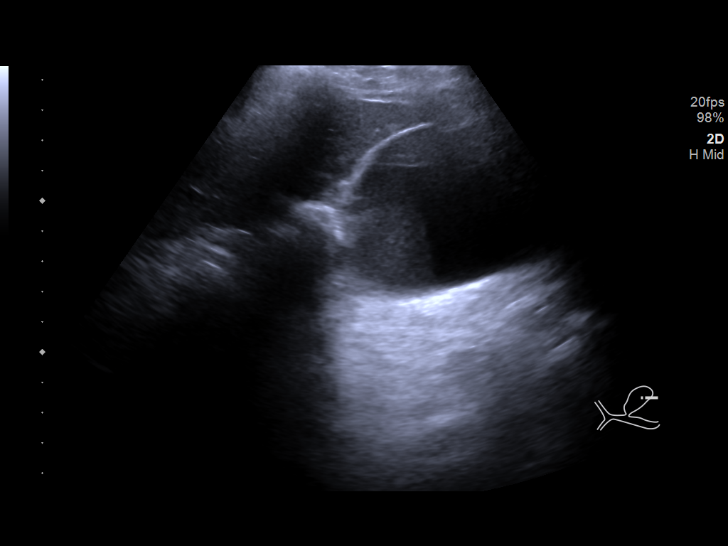
[im 18/33]
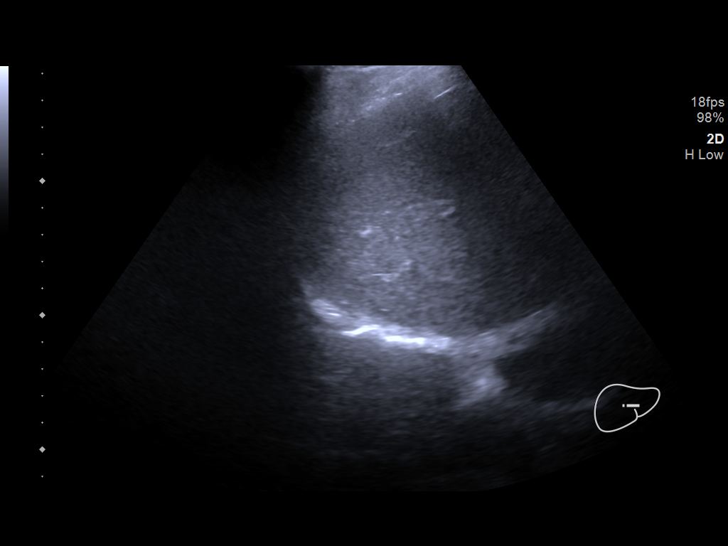
[im 21/33]
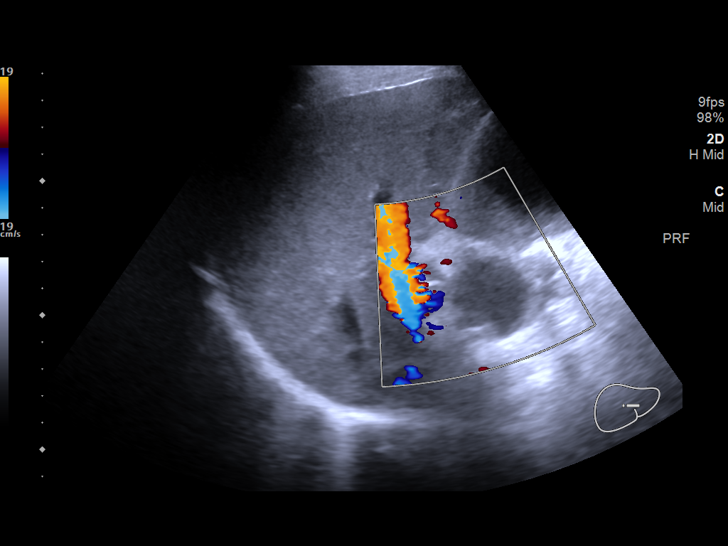
[im 22/33]
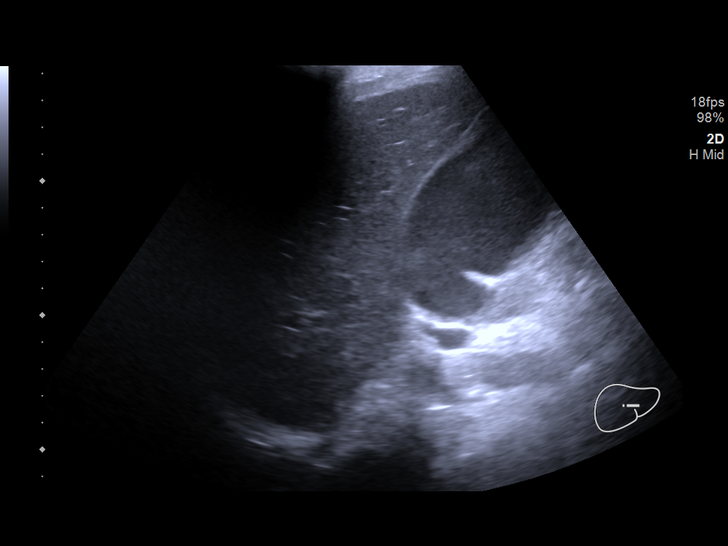
[im 25/33]
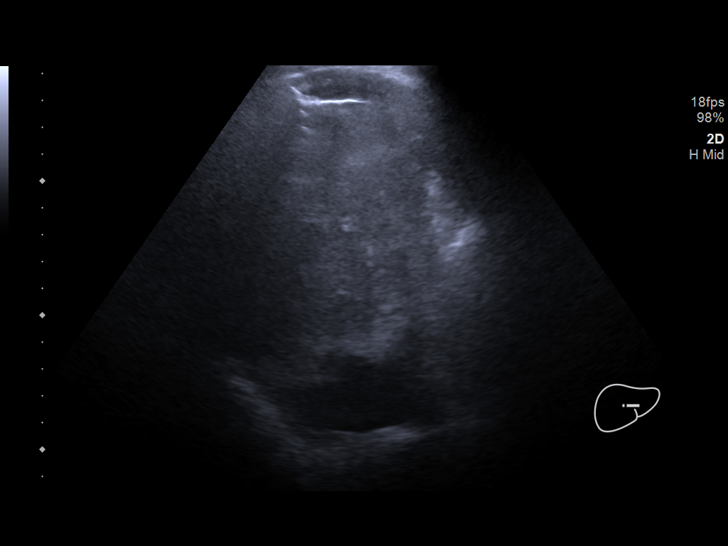
[im 27/33]
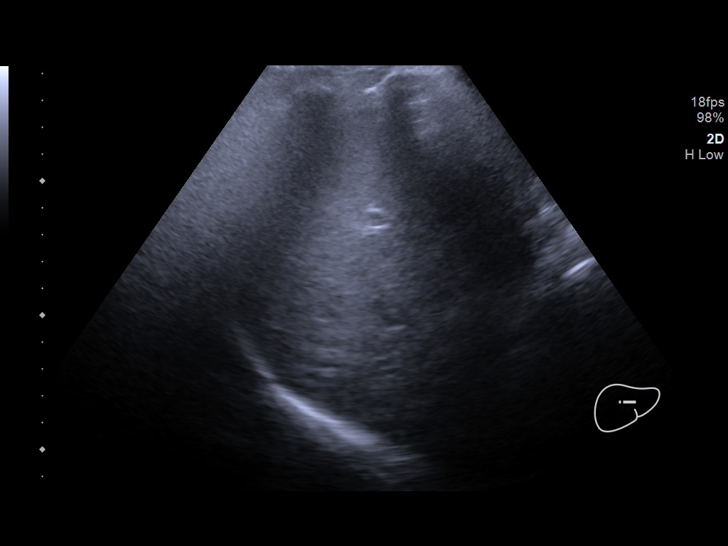
[im 30/33]
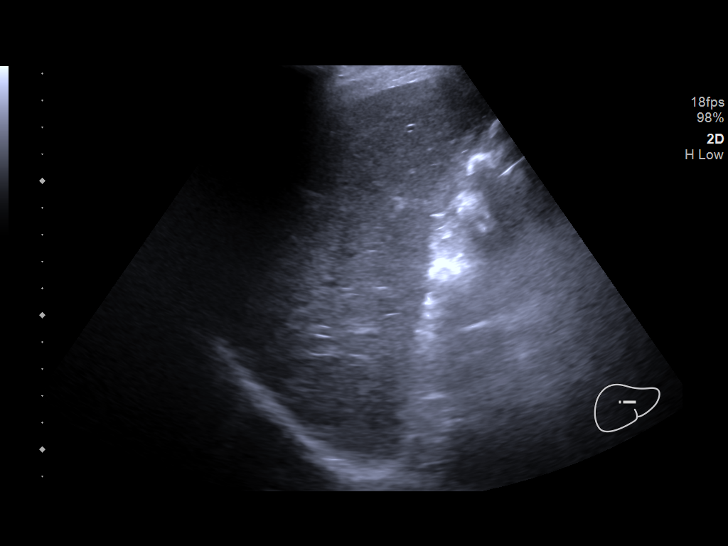
[im 33/33]
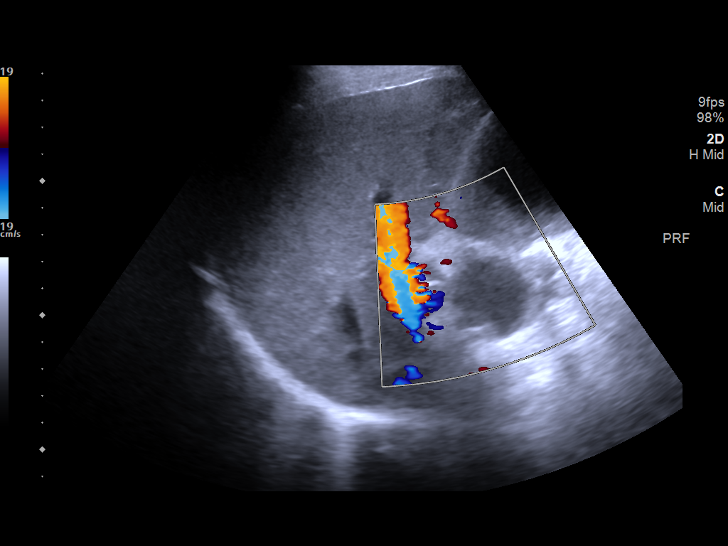

[14 of 25 positions shown; findings below may reference images not displayed]

FINDINGS: Gallbladder:

Amorphous nonshadowing echogenic material in the gallbladder,
compatible with biliary sludge. Additional 9 mm echogenic focus with
no posterior acoustic shadowing lying dependently, compatible with a
sludge ball. Gallbladder is moderately distended. Gallbladder wall
thickness is normal at 2.7 mm. No pericholecystic fluid. Per report
from the sonographer there was no sonographic Murphy's sign on
examination.

Common bile duct:

Diameter: 3.7 mm

Liver:

No focal lesion identified. Within normal limits in parenchymal
echogenicity. Portal vein is patent on color Doppler imaging with
normal direction of blood flow towards the liver.

Other: None.
IMPRESSION: 1. Biliary sludge and biliary sludge ball in the gallbladder. No
findings to suggest an acute cholecystitis at this time.

## 2019-07-13 IMAGING — DX DG ABD PORTABLE 1V
1 series · 1 of 1 positions shown · non-contrast
Comparison: [DATE]

CLINICAL DATA: Abdominal pain.

EXAM:
PORTABLE ABDOMEN - 1 VIEW

[abdomen kub]
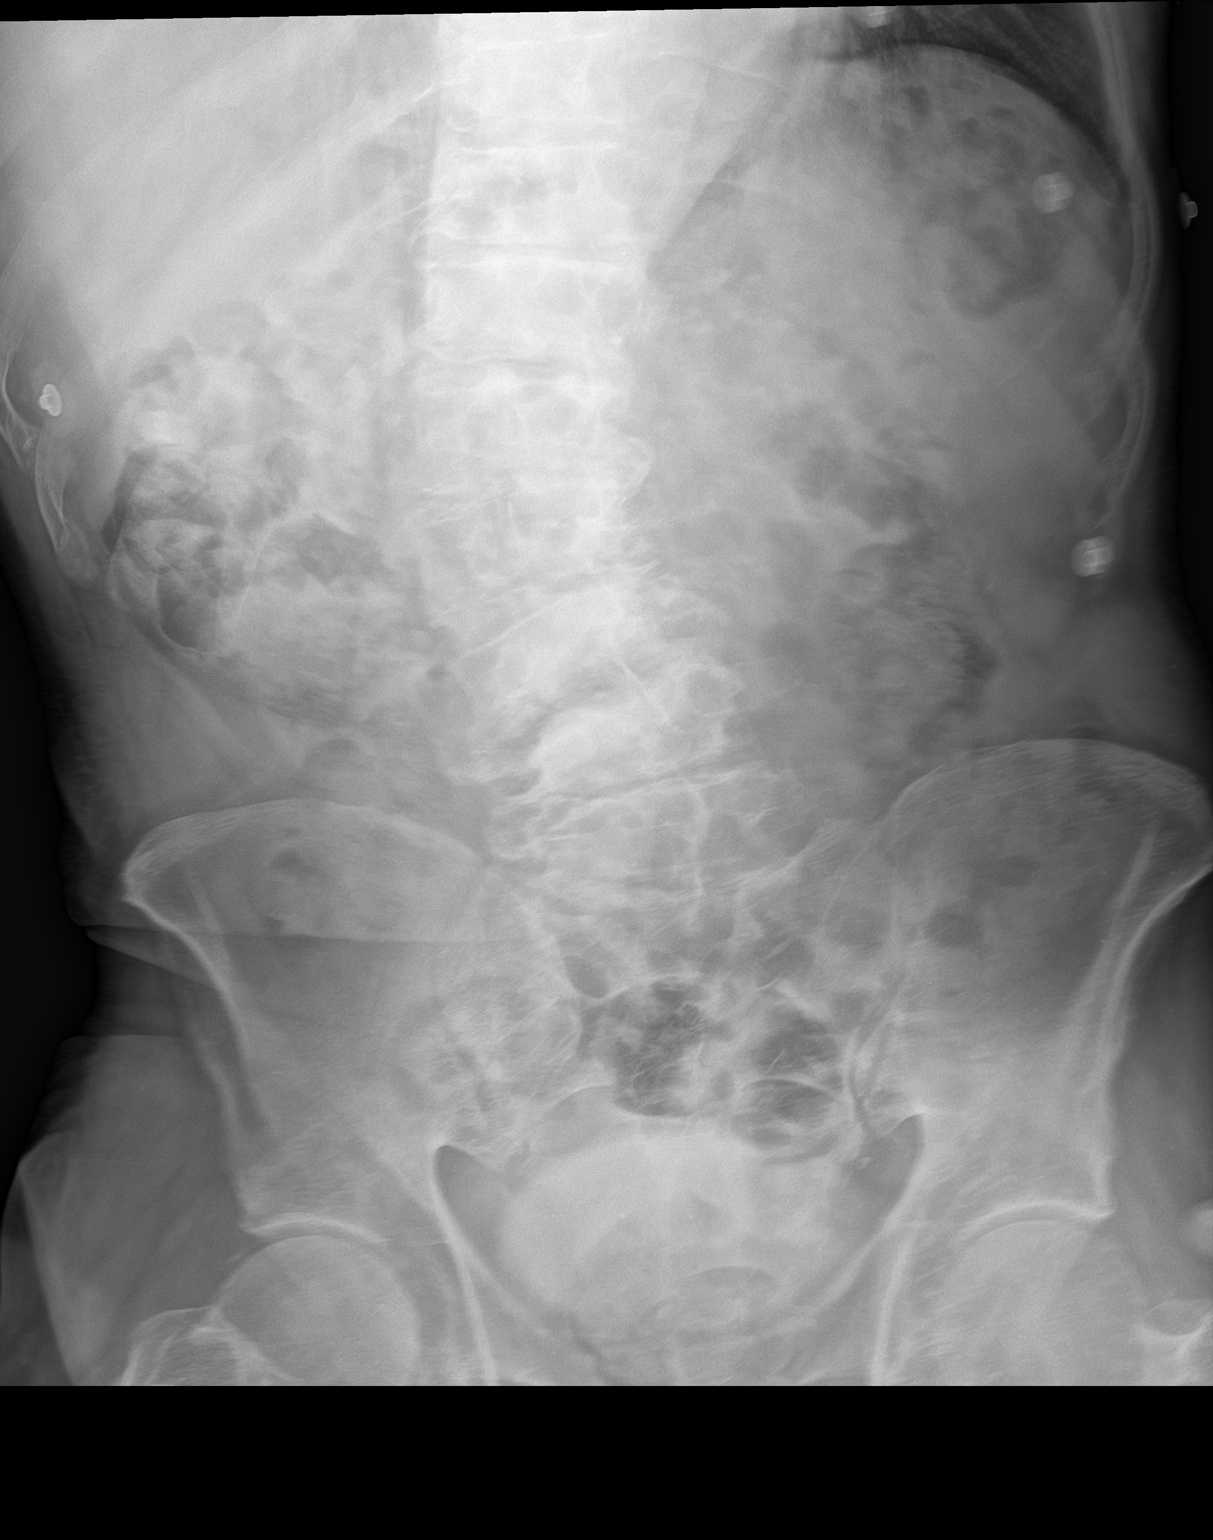

[1 of 1 positions shown; findings below may reference images not displayed]

FINDINGS: Bowel gas pattern is normal. Severe degenerative changes in the
lumbar spine at L3-4. Contrast in the bladder from the CT scan
performed yesterday.
IMPRESSION: Benign-appearing abdomen.

## 2019-07-13 MED ORDER — PREGABALIN 100 MG PO CAPS: 200.0000 mg | ORAL_CAPSULE | Freq: Three times a day (TID) | ORAL | Status: DC

## 2019-07-13 MED ORDER — CLONIDINE HCL 0.1 MG PO TABS: 0.1000 mg | ORAL_TABLET | Freq: Three times a day (TID) | ORAL | Status: DC

## 2019-07-13 MED ORDER — TAMSULOSIN HCL 0.4 MG PO CAPS: 0.4000 mg | ORAL_CAPSULE | Freq: Every day | ORAL | Status: DC

## 2019-07-13 MED ORDER — NALOXEGOL OXALATE 25 MG PO TABS
25.0000 mg | ORAL_TABLET | Freq: Every day | ORAL | Status: DC
  Administered 2019-07-21: 25 mg via ORAL
  Filled 2019-07-13 (×7): qty 1

## 2019-07-13 MED ORDER — LORAZEPAM 2 MG/ML IJ SOLN
1.0000 mg | INTRAMUSCULAR | Status: DC | PRN
  Administered 2019-07-14: 2 mg via INTRAVENOUS
  Administered 2019-07-14: 1 mg via INTRAVENOUS
  Administered 2019-07-14: 2 mg via INTRAVENOUS
  Administered 2019-07-14: 4 mg via INTRAVENOUS
  Administered 2019-07-15: 2 mg via INTRAVENOUS
  Administered 2019-07-15: 4 mg via INTRAVENOUS
  Filled 2019-07-13: qty 1
  Filled 2019-07-13: qty 2
  Filled 2019-07-13 (×3): qty 1
  Filled 2019-07-13: qty 2

## 2019-07-13 MED ORDER — HALOPERIDOL LACTATE 5 MG/ML IJ SOLN
5.0000 mg | Freq: Once | INTRAMUSCULAR | Status: AC
  Administered 2019-07-13: 5 mg via INTRAVENOUS
  Filled 2019-07-13: qty 1

## 2019-07-13 MED ORDER — FENTANYL CITRATE (PF) 100 MCG/2ML IJ SOLN
25.0000 ug | INTRAMUSCULAR | Status: DC
  Administered 2019-07-13 – 2019-07-15 (×10): 25 ug via INTRAVENOUS
  Filled 2019-07-13 (×10): qty 2

## 2019-07-13 MED ORDER — METRONIDAZOLE IN NACL 5-0.79 MG/ML-% IV SOLN
500.0000 mg | Freq: Three times a day (TID) | INTRAVENOUS | Status: DC
  Administered 2019-07-13 – 2019-07-20 (×20): 500 mg via INTRAVENOUS
  Filled 2019-07-13 (×20): qty 100

## 2019-07-13 MED ORDER — HYDROMORPHONE HCL 1 MG/ML IJ SOLN
1.0000 mg | Freq: Once | INTRAMUSCULAR | Status: AC
  Administered 2019-07-13: 1 mg via INTRAVENOUS
  Filled 2019-07-13: qty 1

## 2019-07-13 MED ORDER — OXYCODONE HCL ER 15 MG PO T12A: 40.0000 mg | EXTENDED_RELEASE_TABLET | Freq: Three times a day (TID) | ORAL | Status: DC

## 2019-07-13 MED ORDER — HALOPERIDOL LACTATE 5 MG/ML IJ SOLN
10.0000 mg | Freq: Once | INTRAMUSCULAR | Status: AC
  Administered 2019-07-13: 10 mg via INTRAVENOUS
  Filled 2019-07-13: qty 2

## 2019-07-13 MED ORDER — TRAZODONE HCL 50 MG PO TABS: 50.0000 mg | ORAL_TABLET | Freq: Every day | ORAL | Status: DC

## 2019-07-13 MED ORDER — ALLOPURINOL 100 MG PO TABS
100.0000 mg | ORAL_TABLET | Freq: Every day | ORAL | Status: DC | PRN
  Filled 2019-07-13: qty 1

## 2019-07-13 MED ORDER — LORAZEPAM 2 MG/ML IJ SOLN
2.0000 mg | Freq: Once | INTRAMUSCULAR | Status: AC
  Administered 2019-07-13: 2 mg via INTRAVENOUS
  Filled 2019-07-13: qty 1

## 2019-07-13 MED ORDER — FENTANYL CITRATE (PF) 100 MCG/2ML IJ SOLN: 25.0000 ug | Freq: Four times a day (QID) | INTRAMUSCULAR | Status: DC

## 2019-07-13 MED ORDER — LORAZEPAM 2 MG/ML IJ SOLN
1.0000 mg | Freq: Once | INTRAMUSCULAR | Status: AC
  Administered 2019-07-13: 1 mg via INTRAVENOUS
  Filled 2019-07-13: qty 1

## 2019-07-13 MED ORDER — KETOROLAC TROMETHAMINE 15 MG/ML IJ SOLN: 15.0000 mg | Freq: Four times a day (QID) | INTRAMUSCULAR | Status: DC | PRN

## 2019-07-13 MED ORDER — ONDANSETRON HCL 4 MG/2ML IJ SOLN
4.0000 mg | Freq: Once | INTRAMUSCULAR | Status: AC
  Administered 2019-07-13: 4 mg via INTRAVENOUS
  Filled 2019-07-13: qty 2

## 2019-07-13 MED ORDER — ENOXAPARIN SODIUM 40 MG/0.4ML ~~LOC~~ SOLN
40.0000 mg | SUBCUTANEOUS | Status: DC
  Administered 2019-07-14 – 2019-07-26 (×13): 40 mg via SUBCUTANEOUS
  Filled 2019-07-13 (×13): qty 0.4

## 2019-07-13 MED ORDER — PANTOPRAZOLE SODIUM 40 MG PO TBEC: 40.0000 mg | DELAYED_RELEASE_TABLET | Freq: Every day | ORAL | Status: DC

## 2019-07-13 MED ORDER — KETOROLAC TROMETHAMINE 15 MG/ML IJ SOLN
30.0000 mg | Freq: Four times a day (QID) | INTRAMUSCULAR | Status: AC | PRN
  Administered 2019-07-13 – 2019-07-15 (×2): 30 mg via INTRAVENOUS
  Filled 2019-07-13 (×2): qty 2

## 2019-07-13 MED ORDER — FENTANYL CITRATE (PF) 100 MCG/2ML IJ SOLN
50.0000 ug | Freq: Once | INTRAMUSCULAR | Status: AC
  Administered 2019-07-13: 18:00:00 50 ug via INTRAVENOUS
  Filled 2019-07-13: qty 2

## 2019-07-13 MED ORDER — HALOPERIDOL LACTATE 5 MG/ML IJ SOLN
10.0000 mg | Freq: Four times a day (QID) | INTRAMUSCULAR | Status: DC | PRN
  Administered 2019-07-13: 10 mg via INTRAVENOUS
  Administered 2019-07-14 (×2): 5 mg via INTRAVENOUS
  Administered 2019-07-14 – 2019-07-15 (×3): 10 mg via INTRAVENOUS
  Filled 2019-07-13 (×5): qty 2

## 2019-07-13 MED ORDER — LORAZEPAM 2 MG/ML IJ SOLN: 2.0000 mg | INTRAMUSCULAR | Status: DC | PRN

## 2019-07-13 MED ORDER — SODIUM CHLORIDE 0.9 % IV BOLUS
1000.0000 mL | Freq: Once | INTRAVENOUS | Status: AC
  Administered 2019-07-13: 1000 mL via INTRAVENOUS

## 2019-07-13 MED ORDER — DULOXETINE HCL 60 MG PO CPEP
60.0000 mg | ORAL_CAPSULE | Freq: Two times a day (BID) | ORAL | Status: DC
  Filled 2019-07-13 (×2): qty 1

## 2019-07-13 MED ORDER — SODIUM CHLORIDE 0.9 % IV SOLN
1.0000 g | INTRAVENOUS | Status: DC
  Administered 2019-07-13 – 2019-07-15 (×3): 1 g via INTRAVENOUS
  Filled 2019-07-13: qty 10
  Filled 2019-07-13: qty 1
  Filled 2019-07-13 (×2): qty 10

## 2019-07-13 MED ORDER — HYDRALAZINE HCL 20 MG/ML IJ SOLN
5.0000 mg | INTRAMUSCULAR | Status: DC | PRN
  Administered 2019-07-13 – 2019-07-25 (×6): 5 mg via INTRAVENOUS
  Filled 2019-07-13 (×7): qty 1

## 2019-07-13 NOTE — ED Notes (Signed)
Unable to obtain x ray , pt will not sit still

## 2019-07-13 NOTE — ED Notes (Signed)
After second round of meds pt relax but sats dropped to 86 to 87 % on 4 liters m, pt placed on NRB sats up to 100 %

## 2019-07-13 NOTE — ED Notes (Addendum)
Pt unable to take any po meds at this time due to altered mental status  Admitting MD made aware

## 2019-07-13 NOTE — H&P (Addendum)
History and Physical    Riley Eaton [id number] DOB: 1940-10-14 DOA: 07/13/2019  PCP: Aletha Halim., PA-C (Confirm with patient/family/NH records and if not entered, this has to be entered at [facility] point of entry) Patient coming from: coming from home  I have personally briefly reviewed patient's old medical records in [place]  Chief Complaint: increasing RUQ abdominal pain and acute mental status changes  HPI: Riley Eaton is a 78 y.o. male with medical history significant of PMH/o chronic low back pain, opioid use, thoracic aortic aneurysm, cholelithiasis who presents for evaluation via EMS for worsening abdominal pain.  Per EMS, patient was seen at [facility] emergency department yesterday for abdominal pain.  He had a CT scan at that time that showed cholelithiasis but no evidence of cholecystitis.  Surgery was consulted and felt like he would be better managed for outpatient procedures.  He was discharged home.  EMS was called today when pain continue to worsen. He has had N/V, agitation along with increased and severe pain. Per his wife he is acting as he did last summer when he had cholelithiasis requiring ERCP and stone retrieval. (For level 3, the HPI must include 4+ descriptors: Location, Quality, Severity, Duration, Timing, Context, modifying factors, associated signs/symptoms and/or status of 3+ chronic problems.)  (Please avoid self-populating past medical history here) (The initial 2-3 lines should be focused and good to copy and paste in the HPI section of the daily progress note).  ED Course: Hemodynamically stable. Agitated requiring multiple doses of ativan and haldol. He has had marked pain treated with dilaudid. Due to agitation he was seen by CCM for possible admin of percedex. They did not think this was necessary but did order additional labs, including GGT, ordered, RUQ abdominal U/S,  which has been done revealing sludge but no evidence of acute  cholecystitis,  and CT angio abdomen and CT head. CCM started Rocephin and Flagyl empirically. They recommended [facility] admit patient to step-down for continued evaluation: possible HIDA scan, repeat GS consult.   Review of Systems: As per HPI otherwise 10 point review of systems negative. Caveat - patient not very  communicative.   Past Medical History:  Diagnosis Date   Chronic back pain    Chronic low back pain 05/19/2017   Chronic, continuous use of opioids    for back pain   Coronary artery calcification seen on CT scan    Nerve pain    Thoracic aortic aneurysm [facility])     Past Surgical History:  Procedure Laterality Date   BACK SURGERY     09-2016   back surgey     ENDOSCOPIC RETROGRADE CHOLANGIOPANCREATOGRAPHY (ERCP) WITH PROPOFOL N/A 09/25/2018   Procedure: ENDOSCOPIC RETROGRADE CHOLANGIOPANCREATOGRAPHY (ERCP) WITH PROPOFOL;  Surgeon: Ronnette Juniper, MD;  Location: [place];  Service: Gastroenterology;  Laterality: N/A;   PANCREATIC STENT PLACEMENT  09/25/2018   Procedure: PANCREATIC STENT PLACEMENT;  Surgeon: Ronnette Juniper, MD;  Location: [place];  Service: Gastroenterology;;   REMOVAL OF STONES  09/25/2018   Procedure: REMOVAL OF STONES;  Surgeon: Ronnette Juniper, MD;  Location: [place];  Service: Gastroenterology;;   [name]  09/25/2018   Procedure: [name];  Surgeon: Ronnette Juniper, MD;  Location: [facility] ENDOSCOPY;  Service: Gastroenterology;;    Soc Hx - married 57 years, 3 children, 4 grandchildren, 2 great-grands. He worked as a Freight forwarder until retirement. He likes to be active but is limited by his chronic back pain. His wife describes him as a  humble and generous man.   reports that he has never smoked. He has never used smokeless tobacco. He reports that he does not drink alcohol or use drugs.  No Known Allergies  Family History  Problem Relation Age of Onset   Cancer Mother    Heart attack Father    Unacceptable: Noncontributory,  unremarkable, or negative. Acceptable: Family history reviewed and not pertinent (If you reviewed it)  Prior to Admission medications   Medication Sig Start Date End Date Taking? Authorizing Provider  allopurinol (ZYLOPRIM) 100 MG tablet TAKE 1 TABLET BY MOUTH EVERY DAY Patient taking differently: Take 100 mg by mouth daily as needed (gout flare up).  03/16/19   Kathrynn Ducking, MD  bisacodyl (DULCOLAX) 10 MG suppository Place 1 suppository (10 mg total) rectally as needed for moderate constipation. 10/26/17   Varney Biles, MD  calcium-vitamin D (OSCAL WITH D) 500-200 MG-UNIT tablet Take 1 tablet by mouth daily with breakfast.    [provider]  Diphenhydramine-APAP, sleep, (TYLENOL PM EXTRA STRENGTH PO) Take 1 tablet by mouth at bedtime.     [provider]  DULoxetine (CYMBALTA) 60 MG capsule TAKE 1 CAPSULE BY MOUTH TWICE A DAY Patient taking differently: Take 60 mg by mouth 2 (two) times daily.  07/01/19   Kathrynn Ducking, MD  HYDROcodone-acetaminophen (NORCO/VICODIN) 5-325 MG tablet Take 1 tablet by mouth every 6 (six) hours as needed for moderate pain. 05/03/19   Kathrynn Ducking, MD  MOVANTIK 25 MG TABS tablet Take 25 mg by mouth daily. 06/14/19   [provider]  omeprazole (PRILOSEC) 20 MG capsule Take 20 mg by mouth daily. 05/17/19   [provider]  oxyCODONE (OXYCONTIN) 40 mg 12 hr tablet Take 1 tablet (40 mg total) by mouth 3 (three) times daily. 05/03/19   Kathrynn Ducking, MD  pregabalin (LYRICA) 200 MG capsule Take 1 capsule (200 mg total) by mouth 3 (three) times daily. 04/13/19   Kathrynn Ducking, MD  promethazine (PHENERGAN) 25 MG tablet Take 25 mg by mouth every 8 (eight) hours as needed for nausea/vomiting. 06/15/19   [provider]  tamsulosin (FLOMAX) 0.4 MG CAPS capsule Take 0.4 mg by mouth daily.  08/29/15   [provider]  traZODone (DESYREL) 50 MG tablet Take 50 mg by mouth at bedtime. 05/17/19   [provider]    Physical Exam: Vitals:   07/13/19 1430 07/13/19 1745 07/13/19 1747 07/13/19 1912  BP: (!) 157/118 (!) 179/113 (!) 153/89   Pulse: (!) 106 82 79   Resp: (!) 24  14   Temp: 98.6 F (37 C)     TempSrc: Oral     SpO2: 100% (!) 86% 100% 97%    Constitutional: NAD, calm, comfortable Vitals:   07/13/19 1430 07/13/19 1745 07/13/19 1747 07/13/19 1912  BP: (!) 157/118 (!) 179/113 (!) 153/89   Pulse: (!) 106 82 79   Resp: (!) 24  14   Temp: 98.6 F (37 C)     TempSrc: Oral     SpO2: 100% (!) 86% 100% 97%   General appearance:  Slender athletic man looking younger than his stated age. He is in wrist restraints and remains agitated.  Eyes: PERRL, lids and conjunctivae normal ENMT: unable to follow commands to allow examination.  Neck: normal, supple, no masses, no thyromegaly Respiratory: clear to auscultation bilaterally, no wheezing, no crackles. Normal respiratory effort. No accessory muscle use.  Cardiovascular: Regular rate and rhythm, no murmurs /  rubs / gallops. No extremity edema. 2+ pedal pulses. No carotid bruits.  Abdomen: hypoactive BS, no guarding, no rebound, no fluid wave. Tender to deep palpation and percussion over RUQ, no masses, no hepatomegaly.  Musculoskeletal: no clubbing / cyanosis. No joint deformity upper and lower extremities. Good ROM, no contractures. Normal muscle tone.  Skin: no rashes, lesions, ulcers. No induration Neurologic: CN 2-12 grossly intact. Strength 5/5 in all 4.  Psychiatric: Sedated but remains awake, answer simple questions, I.e. admits to pain. Does not follow commands.   (Anything < 9 systems with 2 bullets each down codes to level 1) (If patient refuses exam cant bill higher level) (Make sure to document decubitus ulcers present on admission -- if possible -- and whether patient has chronic indwelling catheter at time of admission)  Labs on Admission: I have personally reviewed following labs and imaging studies  CBC: Recent Labs    Lab 2019-08-04 1320 07/13/19 1440 07/13/19 1530  WBC 7.5 7.0  --   NEUTROABS 6.5 5.4  --   HGB 13.4 13.7 12.2*  HCT 37.2* 38.2* 36.0*  MCV 93.5 93.9  --   PLT 111* 140*  --    Basic Metabolic Panel: Recent Labs  Lab Aug 04, 2019 1320 07/13/19 1440 07/13/19 1530  NA 137 142 143  K 3.9 3.2* 3.2*  CL 105 108 111  CO2 23 18*  --   GLUCOSE 130* 168* 158*  BUN 18 28* 31*  CREATININE 0.64 1.01 0.70  CALCIUM 9.5 10.0  --    GFR: CrCl cannot be calculated (Unknown ideal weight.). Liver Function Tests: Recent Labs  Lab 08-04-19 1320 07/13/19 1440  AST 27 42*  ALT 16 21  ALKPHOS 90 93  BILITOT 1.4* 2.1*  PROT 7.1 7.6  ALBUMIN 4.2 4.5   Recent Labs  Lab 04-Aug-2019 1320 07/13/19 1440  LIPASE 17 18   No results for input(s): AMMONIA in the last 168 hours. Coagulation Profile: No results for input(s): INR, PROTIME in the last 168 hours. Cardiac Enzymes: No results for input(s): CKTOTAL, CKMB, CKMBINDEX, TROPONINI in the last 168 hours. BNP (last 3 results) No results for input(s): PROBNP in the last 8760 hours. HbA1C: No results for input(s): HGBA1C in the last 72 hours. CBG: No results for input(s): GLUCAP in the last 168 hours. Lipid Profile: No results for input(s): CHOL, HDL, LDLCALC, TRIG, CHOLHDL, LDLDIRECT in the last 72 hours. Thyroid Function Tests: No results for input(s): TSH, T4TOTAL, FREET4, T3FREE, THYROIDAB in the last 72 hours. Anemia Panel: No results for input(s): VITAMINB12, FOLATE, FERRITIN, TIBC, IRON, RETICCTPCT in the last 72 hours. Urine analysis:    Component Value Date/Time   COLORURINE YELLOW 09/25/2018 1514   APPEARANCEUR CLEAR 09/25/2018 1514   LABSPEC >1.046 (H) 09/25/2018 1514   PHURINE 6.0 09/25/2018 1514   GLUCOSEU NEGATIVE 09/25/2018 1514   HGBUR SMALL (A) 09/25/2018 1514   BILIRUBINUR NEGATIVE 09/25/2018 1514   KETONESUR 80 (A) 09/27/2018 1112   PROTEINUR NEGATIVE 09/25/2018 1514   NITRITE NEGATIVE 09/25/2018 1514    LEUKOCYTESUR NEGATIVE 09/25/2018 1514    Radiological Exams on Admission: Ct Abdomen Pelvis W Contrast  Result Date: 2019-08-04 CLINICAL DATA:  Abdominal pain. Right upper quadrant abdominal pain with low back pain. EXAM: CT ABDOMEN AND PELVIS WITH CONTRAST TECHNIQUE: Multidetector CT imaging of the abdomen and pelvis was performed using the standard protocol following bolus administration of intravenous contrast. CONTRAST:  155mL OMNIPAQUE IOHEXOL 300 MG/ML  SOLN COMPARISON:  09/25/2018 FINDINGS: Lower chest: The lung  bases are clear. The heart size is normal. Hepatobiliary: The liver is normal. The gallbladder is distended. There is cholelithiasis.There is no biliary ductal dilation. Pancreas: The pancreas is atrophic. Spleen: The spleen is borderline enlarged measuring approximately 12.5 cm craniocaudad. The splenic vein is patent. Adrenals/Urinary Tract: --Adrenal glands: No adrenal hemorrhage. --Right kidney/ureter: No hydronephrosis or perinephric hematoma. --Left kidney/ureter: No hydronephrosis or perinephric hematoma. --Urinary bladder: The urinary bladder is distended. Stomach/Bowel: --Stomach/Duodenum: There is a large hiatal hernia. --Small bowel: No dilatation or inflammation. --Colon: No focal abnormality. --Appendix: Normal. Vascular/Lymphatic: Atherosclerotic calcification is present within the non-aneurysmal abdominal aorta, without hemodynamically significant stenosis. --No retroperitoneal lymphadenopathy. --No mesenteric lymphadenopathy. --No pelvic or inguinal lymphadenopathy. Reproductive: The prostate gland is enlarged. Other: No ascites or free air. The abdominal wall is normal. Musculoskeletal. There are advanced degenerative changes throughout the visualized lumbar spine, greatest at the L3-L4 level. There is a degenerative dextroscoliosis centered at the L2-L3 level. There is no displaced fracture. IMPRESSION: 1. Distended gallbladder with cholelithiasis. No biliary ductal dilation.  No CT evidence for acute cholecystitis. 2. Borderline splenomegaly. 3. Large hiatal hernia. 4. Aortic atherosclerosis. Aortic Atherosclerosis (ICD10-I70.0). Electronically Signed   By: Constance Holster M.D.   On: 07/12/2019 16:06   Dg Abd Portable 1 View  Result Date: 07/13/2019 CLINICAL DATA:  Abdominal pain. EXAM: PORTABLE ABDOMEN - 1 VIEW COMPARISON:  09/28/2018 FINDINGS: Bowel gas pattern is normal. Severe degenerative changes in the lumbar spine at L3-4. Contrast in the bladder from the CT scan performed yesterday. IMPRESSION: Benign-appearing abdomen. Electronically Signed   By: Lorriane Shire M.D.   On: 07/13/2019 17:41   US Abdomen Limited Ruq  Result Date: 07/13/2019 CLINICAL DATA:  78 year old male with history of abdominal pain. EXAM: ULTRASOUND ABDOMEN LIMITED RIGHT UPPER QUADRANT COMPARISON:  Right upper quadrant abdominal ultrasound 09/25/2018. FINDINGS: Gallbladder: Amorphous nonshadowing echogenic material in the gallbladder, compatible with biliary sludge. Additional 9 mm echogenic focus with no posterior acoustic shadowing lying dependently, compatible with a sludge ball. Gallbladder is moderately distended. Gallbladder wall thickness is normal at 2.7 mm. No pericholecystic fluid. Per report from the sonographer there was no sonographic Murphy's sign on examination. Common bile duct: Diameter: 3.7 mm Liver: No focal lesion identified. Within normal limits in parenchymal echogenicity. Portal vein is patent on color Doppler imaging with normal direction of blood flow towards the liver. Other: None. IMPRESSION: 1. Biliary sludge and biliary sludge ball in the gallbladder. No findings to suggest an acute cholecystitis at this time. Electronically Signed   By: Vinnie Langton M.D.   On: 07/13/2019 19:55    EKG: Independently reviewed. Sinus rhythm, no acute changes  Assessment/Plan Active Problems:   Choledocholithiasis   Acute metabolic encephalopathy   Chronic low back pain    Mental status alteration  (please populate well all problems here in Problem List. (For example, if patient is on BP meds at home and you resume or decide to hold them, it is a problem that needs to be her. Same for CAD, COPD, HLD and so on)   1. Mental status changes - patient very different from baseline according to his wife. He has been agitated since last night. He behaved like this last summer when he had gallstones. She feels that his agitation and mental status changes are pain driven. Plan Admit to step-down 2/2 agitation and need for close supervision  Continue wrist restraints to prevent injury to self.  Haloperidol 10 mg q 4 prn severe agitation - discussed use with  spouse.  2. Gallstones - primary suspect for origin of pain is recurrent gallstones. Plan HIDA scan for functional assessment if GS agrees and if study can be done given chronic use of   Oxycontin.   General surgery consult-to be called in AM  3. Pain management - patient is on chronic opioids for back pain - seen in pain clinic. He is most likely tolerant to narcotics. Plan Continue Oxycontin per home dose  Ketorolac 30 mg IV q 6 prn uncontrolled pain - higher dose given pain med tolerance. Nl renal fxn  4. HTN - patient unable to take oral meds at this time. SBP >180. Plan hydrtalazine IV 5mg  q4 prn SBP >180. DBP >100  DVT prophylaxis: lovenox (Lovenox/Heparin/SCD's/anticoagulated/None (if comfort care) Code Status: full code (Full/Partial (specify details) Family Communication:  Spoke with wife: reviewed history and treatment plan. Answered questions. Discussed code status - favors full code (Specify name, relationship. Do not write "discussed with patient". Specify tel # if discussed over the phone) Disposition Plan: home when medically stable (specify when and where you expect patient to be discharged) Consults called: CCM has seen pt. Will need GS reconsult (with names) Admission status: inpatient (inpatient /  obs / tele / medical floor / SDU)   Adella Hare MD [facility] Pager [phone]  If 7PM-7AM, please contact night-coverage [email or web address] Password [email or web address]  07/13/2019, 9:03 PM

## 2019-07-13 NOTE — ED Notes (Signed)
Condom cath placed on pt 

## 2019-07-13 NOTE — ED Provider Notes (Addendum)
[facility]Y DE[facility]MENT Provider Note   CSN: [id number] Arrival date & time: 07/13/19  1417     History   Chief Complaint Chief Complaint  Patient presents with   Altered Mental Status    HPI [facility] E[facility] is a 78 y.o. male with PMH/o chronic low back Eaton, [facility] Eaton, Riley Eaton, Riley who presents for evaluation via EMS for worsening abdominal Eaton.  Per EMS, patient was seen at [facility] emergency department yesterday for abdominal Eaton.  He had a CT scan at that time that showed Riley but no evidence of cholecystitis.  Surgery was consulted and felt like he would be better managed for outpatient procedures.  He was discharged home.  EMS was called today when Eaton continue to worsen.  EM LEVEL 5 CAVEAT DUE TO AMS      The history is provided by the patient.    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   Chronic, continuous Eaton of opioids    for back Eaton   Coronary artery calcification seen on CT scan    Nerve Eaton    [facility] E[facility])     Patient Active Problem List   Diagnosis Date Noted   ACS (acute coronary syndrome) ([place]) 07/14/2019   Mental status alteration [id number]   Acute metabolic encephalopathy [id number]   Protein-calorie malnutrition, severe 09/28/2018   Nausea & vomiting 09/25/2018   Choledocholithiasis 09/25/2018   Chronic Eaton 09/25/2018   Urinary retention 09/25/2018   Constipation 09/25/2018   Intractable vomiting    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   [facility] Eaton ([place]) 10/03/2015   Coronary artery calcification seen on CT scan 10/03/2015    Past Surgical History:  Procedure Laterality Date   BACK SURGERY     09-2016   back surgey     ENDOSCOPIC RETROGRADE CHOLANGIOPANCREATOGRAPHY (ERCP) WITH PROPOFOL N/A 09/25/2018   Procedure: ENDOSCOPIC RETROGRADE CHOLANGIOPANCREATOGRAPHY  (ERCP) WITH PROPOFOL;  Surgeon: Ronnette Juniper, MD;  Location: [place];  Service: Gastroenterology;  Laterality: N/A;   PANCREATIC STENT PLACEMENT  09/25/2018   Procedure: PANCREATIC STENT PLACEMENT;  Surgeon: Ronnette Juniper, MD;  Location: [facility] ENDOSCOPY;  Service: Gastroenterology;;   REMOVAL OF STONES  09/25/2018   Procedure: REMOVAL OF STONES;  Surgeon: Ronnette Juniper, MD;  Location: [place];  Service: Gastroenterology;;   [name]  09/25/2018   Procedure: [name];  Surgeon: Ronnette Juniper, MD;  Location: [facility] ENDOSCOPY;  Service: Gastroenterology;;        Home Medications    Prior to Admission medications   Medication Sig Start Date End Date Taking? Authorizing Provider  allopurinol (ZYLOPRIM) 100 MG tablet TAKE 1 TABLET BY MOUTH EVERY DAY Patient taking differently: Take 100 mg by mouth daily as needed (gout flare up).  03/16/19   Kathrynn Ducking, MD  bisacodyl (DULCOLAX) 10 MG suppository Place 1 suppository (10 mg total) rectally as needed for moderate constipation. 10/26/17   Varney Biles, MD  calcium-vitamin D (OSCAL WITH D) 500-200 MG-UNIT tablet Take 1 tablet by mouth daily with breakfast.    [provider]  Diphenhydramine-APAP, sleep, (TYLENOL PM EXTRA STRENGTH PO) Take 1 tablet by mouth at bedtime.     [provider]  DULoxetine (CYMBALTA) 60 MG capsule TAKE 1 CAPSULE BY MOUTH TWICE A DAY Patient taking differently: Take 60 mg by mouth 2 (two) times daily.  07/01/19   Kathrynn Ducking, MD  HYDROcodone-acetaminophen (NORCO/VICODIN) 5-325 MG tablet Take 1 tablet by mouth every 6 (six) hours as needed for moderate Eaton. 05/03/19   Kathrynn Ducking, MD  MOVANTIK 25 MG TABS tablet Take 25 mg by mouth daily. 06/14/19   [provider]  omeprazole (PRILOSEC) 20 MG capsule Take 20 mg by mouth daily. 05/17/19   [provider]  oxyCODONE (OXYCONTIN) 40 mg 12 hr tablet Take 1 tablet (40 mg total) by mouth 3 (three) times daily. 05/03/19    Kathrynn Ducking, MD  pregabalin (LYRICA) 200 MG capsule Take 1 capsule (200 mg total) by mouth 3 (three) times daily. 04/13/19   Kathrynn Ducking, MD  promethazine (PHENERGAN) 25 MG tablet Take 25 mg by mouth every 8 (eight) hours as needed for nausea/vomiting. 06/15/19   [provider]  tamsulosin (FLOMAX) 0.4 MG CAPS capsule Take 0.4 mg by mouth daily.  08/29/15   [provider]  traZODone (DESYREL) 50 MG tablet Take 50 mg by mouth at bedtime. 05/17/19   [provider]    Family History Family History  Problem Relation Age of Onset   Cancer Mother    Heart attack Father     Social History Social History   Tobacco Eaton   Smoking status: Never Smoker   Smokeless tobacco: Never Used  Substance Eaton Topics   Alcohol Eaton: No   Drug Eaton: No     Allergies   Patient has no known allergies.   Review of Systems Review of Systems  Unable to perform ROS: Mental status change     Physical Exam Updated Vital Signs BP (!) 160/99    Pulse 76    Temp 98 F (36.7 C) (Axillary)    Resp 15    SpO2 100%   Physical Exam Vitals signs and nursing note reviewed.  Constitutional:      Comments: Appears uncomfortable.  HENT:     Head: Normocephalic and atraumatic.  Eyes:     General: Lids are normal.     Conjunctiva/sclera: Conjunctivae normal.     Pupils: Pupils are equal, round, and reactive to light.  Neck:     Musculoskeletal: Full passive range of motion without Eaton.  Cardiovascular:     Rate and Rhythm: Normal rate and regular rhythm.     Pulses: Normal pulses.          Radial pulses are 2+ on the right side and 2+ on the left side.       Dorsalis pedis pulses are 2+ on the right side and 2+ on the left side.     Heart sounds: Normal heart sounds. No murmur. No friction rub. No gallop.   Pulmonary:     Effort: Pulmonary effort is normal.     Breath sounds: Normal breath sounds.     Comments: Lungs clear to auscultation bilaterally.   Symmetric chest rise.  No wheezing, rales, rhonchi. Abdominal:     Palpations: Abdomen is soft. Abdomen is not rigid.     Tenderness: There is generalized abdominal tenderness. There is guarding.     Comments: Diffuse tenderness in the abdomen with guarding noted.  Musculoskeletal: Normal range of motion.  Skin:    General: Skin is warm and dry.     Capillary Refill: Capillary refill takes less than 2 seconds.  Neurological:     Mental Status: He is alert and oriented to person, place, and time.     Comments: Alert and oriented x1.  He cannot tell  me where he is or what year it is. He follows some commands.  He is moving all extremities spontaneously.  Psychiatric:        Speech: Speech normal.      ED Treatments / Results  Labs (all labs ordered are listed, but only abnormal results are displayed) Labs Reviewed  COMPREHENSIVE METABOLIC PANEL - Abnormal; Notable for the following components:      Result Value   Potassium 3.2 (*)    CO2 18 (*)    Glucose, Bld 168 (*)    BUN 28 (*)    AST 42 (*)    Total Bilirubin 2.1 (*)    Anion gap 16 (*)    All other components within normal limits  CBC WITH DIFFERENTIAL/PLATELET - Abnormal; Notable for the following components:   RBC 4.07 (*)    HCT 38.2 (*)    Platelets 140 (*)    All other components within normal limits  LACTIC ACID, PLASMA - Abnormal; Notable for the following components:   Lactic Acid, Venous 3.4 (*)    All other components within normal limits  URINALYSIS, ROUTINE W REFLEX MICROSCOPIC - Abnormal; Notable for the following components:   Hgb urine dipstick MODERATE (*)    Ketones, ur 20 (*)    Protein, ur 30 (*)    Bacteria, UA RARE (*)    All other components within normal limits  COMPREHENSIVE METABOLIC PANEL - Abnormal; Notable for the following components:   Potassium 3.2 (*)    Glucose, Bld 144 (*)    AST 55 (*)    Total Bilirubin 1.5 (*)    All other components within normal limits  CBC - Abnormal;  Notable for the following components:   RBC 3.92 (*)    HCT 36.9 (*)    Platelets 105 (*)    All other components within normal limits  HEPATIC FUNCTION PANEL - Abnormal; Notable for the following components:   AST 54 (*)    Total Bilirubin 1.7 (*)    Bilirubin, Direct 0.3 (*)    Indirect Bilirubin 1.4 (*)    All other components within normal limits  I-STAT CHEM 8, ED - Abnormal; Notable for the following components:   Potassium 3.2 (*)    BUN 31 (*)    Glucose, Bld 158 (*)    Calcium, Ion 1.14 (*)    TCO2 21 (*)    Hemoglobin 12.2 (*)    HCT 36.0 (*)    All other components within normal limits  TROPONIN I (HIGH SENSITIVITY) - Abnormal; Notable for the following components:   Troponin I (High Sensitivity) 684 (*)    All other components within normal limits  TROPONIN I (HIGH SENSITIVITY) - Abnormal; Notable for the following components:   Troponin I (High Sensitivity) 1,004 (*)    All other components within normal limits  TROPONIN I (HIGH SENSITIVITY) - Abnormal; Notable for the following components:   Troponin I (High Sensitivity) 974 (*)    All other components within normal limits  CULTURE, BLOOD (ROUTINE X 2)  SARS CORONAVIRUS 2 BY RT PCR (HOSPITAL ORDER, [place] [facility])  CULTURE, BLOOD (ROUTINE X 2)  LIPASE, BLOOD  LACTIC ACID, PLASMA  GAMMA GT  PROCALCITONIN  POC SARS CORONAVIRUS 2 AG -  ED  TROPONIN I (HIGH SENSITIVITY)    EKG EKG Interpretation  Date/Time:  Tuesday July 13 2019 14:53:49 EST Ventricular Rate:  88 PR Interval:    QRS Duration: 97 QT  Interval:  405 QTC Calculation: 490 R Axis:   89 Text Interpretation: Sinus arrhythmia Consider RVH w/ secondary repol abnormality Borderline prolonged QT interval Confirmed by Veryl Speak [phone]) on 07/13/2019 3:40:01 PM   Radiology Ct Abdomen Pelvis W Contrast  Result Date: 07/12/2019 CLINICAL DATA:  Abdominal Eaton. Right upper quadrant abdominal Eaton with low back Eaton. EXAM:  CT ABDOMEN AND PELVIS WITH CONTRAST TECHNIQUE: Multidetector CT imaging of the abdomen and pelvis was performed using the standard protocol following bolus administration of intravenous contrast. CONTRAST:  139mL OMNIPAQUE IOHEXOL 300 MG/ML  SOLN COMPARISON:  09/25/2018 FINDINGS: Lower chest: The lung bases are clear. The heart size is normal. Hepatobiliary: The liver is normal. The gallbladder is distended. There is Riley.There is no biliary ductal dilation. Pancreas: The pancreas is atrophic. Spleen: The spleen is borderline enlarged measuring approximately 12.5 cm craniocaudad. The splenic vein is patent. Adrenals/Urinary Tract: --Adrenal glands: No adrenal hemorrhage. --Right kidney/ureter: No hydronephrosis or perinephric hematoma. --Left kidney/ureter: No hydronephrosis or perinephric hematoma. --Urinary bladder: The urinary bladder is distended. Stomach/Bowel: --Stomach/Duodenum: There is a large hiatal hernia. --Small bowel: No dilatation or inflammation. --Colon: No focal abnormality. --Appendix: Normal. Vascular/Lymphatic: Atherosclerotic calcification is present within the non-aneurysmal abdominal aorta, without hemodynamically significant stenosis. --No retroperitoneal lymphadenopathy. --No mesenteric lymphadenopathy. --No pelvic or inguinal lymphadenopathy. Reproductive: The prostate gland is enlarged. Other: No ascites or free air. The abdominal wall is normal. Musculoskeletal. There are advanced degenerative changes throughout the visualized lumbar spine, greatest at the L3-L4 level. There is a degenerative dextroscoliosis centered at the L2-L3 level. There is no displaced fracture. IMPRESSION: 1. Distended gallbladder with Riley. No biliary ductal dilation. No CT evidence for acute cholecystitis. 2. Borderline splenomegaly. 3. Large hiatal hernia. 4. Aortic atherosclerosis. Aortic Atherosclerosis (ICD10-I70.0). Electronically Signed   By: Constance Holster M.D.   On: 07/12/2019  16:06   Dg Abd Portable 1 View  Result Date: 07/13/2019 CLINICAL DATA:  Abdominal Eaton. EXAM: PORTABLE ABDOMEN - 1 VIEW COMPARISON:  09/28/2018 FINDINGS: Bowel gas pattern is normal. Severe degenerative changes in the lumbar spine at L3-4. Contrast in the bladder from the CT scan performed yesterday. IMPRESSION: Benign-appearing abdomen. Electronically Signed   By: Lorriane Shire M.D.   On: 07/13/2019 17:41   US Abdomen Limited Ruq  Result Date: 07/13/2019 CLINICAL DATA:  78 year old male with history of abdominal Eaton. EXAM: ULTRASOUND ABDOMEN LIMITED RIGHT UPPER QUADRANT COMPARISON:  Right upper quadrant abdominal ultrasound 09/25/2018. FINDINGS: Gallbladder: Amorphous nonshadowing echogenic material in the gallbladder, compatible with biliary sludge. Additional 9 mm echogenic focus with no posterior acoustic shadowing lying dependently, compatible with a sludge ball. Gallbladder is moderately distended. Gallbladder wall thickness is normal at 2.7 mm. No pericholecystic fluid. Per report from the sonographer there was no sonographic Murphy's sign on examination. Common bile duct: Diameter: 3.7 mm Liver: No focal lesion identified. Within normal limits in parenchymal echogenicity. Portal vein is patent on color Doppler imaging with normal direction of blood flow towards the liver. Other: None. IMPRESSION: 1. Biliary sludge and biliary sludge ball in the gallbladder. No findings to suggest an acute cholecystitis at this time. Electronically Signed   By: Vinnie Langton M.D.   On: 07/13/2019 19:55    Procedures Procedures (including critical care time)  Medications Ordered in ED Medications  LORazepam (ATIVAN) injection 1-4 mg (1 mg Intravenous Given 07/14/19 0812)  cefTRIAXone (ROCEPHIN) 1 g in sodium chloride 0.9 % 100 mL IVPB (0 g Intravenous Stopped 07/13/19 2226)  metroNIDAZOLE (FLAGYL) IVPB 500 mg (  0 mg Intravenous Stopped 07/14/19 1151)  fentaNYL (SUBLIMAZE) injection 25 mcg (25 mcg  Intravenous Given 07/14/19 1108)  cloNIDine (CATAPRES) tablet 0.1 mg (0.1 mg Oral Not Given 07/14/19 1031)  allopurinol (ZYLOPRIM) tablet 100 mg (has no administration in time range)  oxyCODONE (OXYCONTIN) 12 hr tablet 40 mg (40 mg Oral Not Given 07/14/19 1032)  DULoxetine (CYMBALTA) DR capsule 60 mg (60 mg Oral Not Given 07/14/19 1031)  traZODone (DESYREL) tablet 50 mg (50 mg Oral Not Given 07/13/19 2241)  naloxegol oxalate (MOVANTIK) tablet 25 mg (has no administration in time range)  pantoprazole (PROTONIX) EC tablet 40 mg (40 mg Oral Not Given 07/14/19 1032)  tamsulosin (FLOMAX) capsule 0.4 mg (0.4 mg Oral Not Given 07/14/19 1033)  pregabalin (LYRICA) capsule 200 mg (200 mg Oral Not Given 07/14/19 1033)  enoxaparin (LOVENOX) injection 40 mg (40 mg Subcutaneous Given 07/14/19 1049)  ketorolac (TORADOL) 15 MG/ML injection 30 mg (30 mg Intravenous Given 07/13/19 2252)  haloperidol lactate (HALDOL) injection 10 mg (10 mg Intravenous Given 07/14/19 1308)  hydrALAZINE (APRESOLINE) injection 5 mg (5 mg Intravenous Given 07/13/19 2249)  metoprolol tartrate (LOPRESSOR) injection 5 mg (5 mg Intravenous Given 07/14/19 1408)  ondansetron (ZOFRAN) injection 4 mg (4 mg Intravenous Given 07/13/19 1449)  HYDROmorphone (DILAUDID) injection 1 mg (1 mg Intravenous Given 07/13/19 1449)  haloperidol lactate (HALDOL) injection 5 mg (5 mg Intravenous Given 07/13/19 1505)  LORazepam (ATIVAN) injection 1 mg (1 mg Intravenous Given 07/13/19 1543)  sodium chloride 0.9 % bolus 1,000 mL (0 mLs Intravenous Stopped 07/13/19 1744)  LORazepam (ATIVAN) injection 1 mg (1 mg Intravenous Given 07/13/19 1645)  haloperidol lactate (HALDOL) injection 10 mg (10 mg Intravenous Given 07/13/19 1744)  fentaNYL (SUBLIMAZE) injection 50 mcg (50 mcg Intravenous Given 07/13/19 1744)  LORazepam (ATIVAN) injection 2 mg (2 mg Intravenous Given 07/13/19 1744)  potassium chloride 10 mEq in 100 mL IVPB (0 mEq Intravenous Stopped 07/14/19 1408)  aspirin suppository  300 mg (300 mg Rectal Given 07/14/19 1048)     Initial Impression / Assessment and Plan / ED Course  I have reviewed the triage vital signs and the nursing notes.  Pertinent labs & imaging results that were available during my care of the patient were reviewed by me and considered in my medical decision making (see chart for details).  Clinical Course as of Jul 13 1549  Tue Jul 13, 2019  1849 Lactic Acid, Venous(!!): 3.4 [GG]    Clinical Course User Index [GG] Corena Herter, PA-C       78 year old male who presents for evaluation of abdominal Eaton, altered mental status.  Seen at [facility] emergency department yesterday.  CT scan showed Riley.  No evidence of cholecystitis.  Surgery was consulted and felt like he would be better managed on outpatient basis.  Comes in today for worsening Eaton.  On initially arrival, he is afebrile, tachycardic, hypertensive.  He appears very uncomfortable.  He is alert and oriented x1.  Plan to check labs, imaging.  I discussed with both his wife and his daughter.  They state yesterday, he came home and had been feeling better but throughout the night, he was having worsening Eaton.  Wife does report that he has been more confused over the night and had started vomiting.  This prompted EMS call.  Lactic is elevated at 3.4.  BUN and creatinine are within normal limits.  CBC shows no leukocytosis.  Patient still agitated despite Dilaudid, Haldol, Ativan x2.  At this time, concerned  that he is withdrawing from narcotics.  We will plan to consult ICU for question if he needs to be started on a Precedex drip.  Discussed with Dr. Vaughan Browner (critical care).  They will plan to come consult on patient.  Patient signed out to Krista Blue, PA-C with labs, CT pending.   Portions of this note were generated with [employer]. Dictation errors may occur despite best attempts at proofreading.  Final Clinical Impressions(s) / ED Diagnoses    Final diagnoses:  Abdominal Eaton    ED Discharge Orders    None       [name], PA-C 07/13/19 1703    [name], PA-C 07/14/19 1550    Veryl Speak, MD 07/14/19 1646

## 2019-07-13 NOTE — ED Notes (Signed)
Pt continue to be agitated, trying to get out of bed.  Pulling monitor leads and 02 off

## 2019-07-13 NOTE — ED Notes (Signed)
Pt is awake and restless. Pt has been trying to get up and was asked by staff several times to lay down.

## 2019-07-13 NOTE — ED Provider Notes (Signed)
Received patient as a handoff from Simonne Martinet, PA-C at shift change.  Patient is a 78 year old male with PMH significant for opioid use, thoracic aortic aneurysm, and cholelithiasis who presents via EMS for worsening abdominal discomfort.  Patient was evaluated [facility] yesterday for similar symptoms and CT abdomen demonstrated cholelithiasis without gallbladder wall thickening or other evidence concerning for cholecystitis. Surgery was consulted and they recommended outpatient follow-up.   Level V Caveat due to AMS.  Physical Exam  BP (!) 153/89   Pulse 79   Temp 98.6 F (37 C) (Oral)   Resp 14   SpO2 97%   Physical Exam Vitals signs and nursing note reviewed. Exam conducted with a chaperone present.  Constitutional:      Comments: Obvious discomfort.  Moving extremities.  HENT:     Head: Normocephalic and atraumatic.  Eyes:     General: No scleral icterus.    Conjunctiva/sclera: Conjunctivae normal.  Neck:     Musculoskeletal: Normal range of motion. No neck rigidity.  Cardiovascular:     Rate and Rhythm: Normal rate and regular rhythm.     Pulses: Normal pulses.     Heart sounds: Normal heart sounds.  Pulmonary:     Effort: Pulmonary effort is normal. No respiratory distress.     Breath sounds: Normal breath sounds. No wheezing.  Abdominal:     Comments: Soft, nondistended.  Significant TTP diffusely.  No overlying skin changes.  Skin:    General: Skin is dry.  Neurological:     Mental Status: He is alert.     GCS: GCS eye subscore is 4. GCS verbal subscore is 5. GCS motor subscore is 6.     Comments: Moving all extremities.  Psychiatric:     Comments: Agitated.  Oriented only to himself.  Poor historian.      ED Course/Procedures   Clinical Course as of Jul 12 2014  Tue Jul 13, 2019  1849 Lactic Acid, Venous(!!): 3.4 [GG]    Clinical Course User Index [GG] Corena Herter, PA-C    Procedures   MDM   Patient presenting the ED for AMS and  generalized abdominal pain.  CT obtained yesterday demonstrated no evidence of cholecystitis.  While the plan was for him to be managed by surgery outpatient, he returned to the ED today for worsening abdominal discomfort.  Patient is uncomfortable and disruptive.  Patient has become increasingly confused.  Lactate is elevated to 3.4.   Patient agitated despite numerous rounds of Dilaudid, Haldol, and Ativan.  Patient has chronic opiate abuse.  Simonne Martinet PA-C spoke with Dr. Vaughan Browner from critical care as case was handed off to me.  Discussed possible Precedex given his significant agitation and disruption in the ED.  Montey Hora, PA-C from critical care evaluated patient and, in conjunction with Dr. Vaughan Browner, do not feel as though he will require Precedex.  They suggest that he is stable for admission to stepdown unit.   Plan is to repeat head CT as well as chest/abd/pelvis CT w/ contrast.  Critical care suggests RUQ ultrasound and HIDA scan given findings from yesterday.  Blood cultures obtained and empiric antibiotic started with Flagyl and ceftriaxone.  Will consult [facility] for admission to step-down unit for agitation secondary to generalized abdominal pain. Spoke with Dr. Linda Hedges who will see the patient. Contacted charge nurse regarding Tier 2 COVID-19 testing as his rapid 15-minute test was negative. Patient to be admitted.      Corena Herter,  PA-C 07/13/19 2257    Milton Ferguson, MD 07/14/19 2302

## 2019-07-13 NOTE — ED Triage Notes (Signed)
Pt here from home confused moving all in bed not making any sense confused, was at [facility] yesterday and was suppose to have his gallbladder out , pt received 200 fentanyl  By ems , cbg  127

## 2019-07-13 NOTE — Consult Note (Signed)
NAME:  Riley Eaton, MRN:  [id number], DOB:  30-May-1941, LOS: 0 ADMISSION DATE:  07/13/2019, CONSULTATION DATE:  07/13/19 REFERRING MD:  Roderic Palau  CHIEF COMPLAINT:  AMS, Abd pain   Brief History   Riley Eaton is a 78 y.o. male who presented to ED 12/1 with abd pain and AMS.  Had been seen 1 day earlier and CT abd / pelv showed cholelithiasis without cholecystitis.  History of present illness   Riley Eaton is a 78 y.o. male who has a PMH including but not limited to thoracic aortic aneurysm, CAD, chronic back pain, chronic opioid use (see "past medical history" for rest).  He presented to [facility] ED 12/1 with AMS and abd pain.  He had been seen in [facility] ED 1 day prior for abd pain.  Had CT of the abd at the time that demonstrated cholelithiasis without cholecystitis.  Surgery was consulted and recommended outpatient follow up.  He was subsequently discharged home.  12/1 had worsening of pain along with AMS; therefore, came to [facility] ED.  He was quite agitated per reports and received 1mg  dilaudid, 5mg  haldol, 1mg  ativan x 3 and had minimal response.   PCCM was asked to see in consultation for consideration of precedex.  Per chart review, he has no underlying hx of EtOH abuse or substance abuse.  He does have hx of chronic low back pain and chronic opioid use.  On review of outpatient med list, he had 5-325 norco prescribed for q6hrs PRN, 40mg  oxycontin TID, lyrica 200mg  TID, duloxetine 60mg  BID.  Per chart review, he had admission 09/24/18 through 10/06/18 for acute ascending cholangitis s/p ERCP and sphincterotomy with stenting of pancreatic duct (Dr. Therisa Doyne).  During that admission, he did have severe delirium felt to be due to narcotic withdrawal.  He did spend a few days in ICU and was briefly treated with precedex and fentanyl.  He was discharged and supposed to be evaluated by surgery as an outpatient for cholecystectomy; however, this was postponed due to the [place] pandemic.  Per discharge  summary, his medication regimen was adjusted prior to d/c and pt had been tolerating it well (oxycodone weaned from 80mg  BID to BID, lyrica reduced from 200mg  TID to 100mg  TID, cymbalta from 60mg  BID to 60mg  daily).  Past Medical History  has Thoracic aortic aneurysm ([place]); Coronary artery calcification seen on CT scan; Chronic low back pain; Nausea & vomiting; Choledocholithiasis; Chronic pain; Intractable vomiting; Urinary retention; Constipation; Protein-calorie malnutrition, severe; and Acute metabolic encephalopathy on their problem list.  Significant Hospital Events   12/1 > admit.  Consults:  PCCM.  Procedures:  None.  Significant Diagnostic Tests:  CT A / P 11/30 > cholelithiasis, no cholecystitis.  Borderline splenomegaly.  Large hiatal hernia. RUQ [place] 12/1 >  CT head 12/1 >   Micro Data:  Blood 12/1 >  SARS CoV2 12/1 >   Antimicrobials:  Ceftriaxone 12/1 >  Flagyl 12/1 >   Interim history/subjective:  Somnolent but opens eyes to noxious stimuli.  Objective:  Blood pressure (!) 157/118, pulse (!) 106, temperature 98.6 F (37 C), temperature source Oral, resp. rate (!) 24, SpO2 100 %.       No intake or output data in the 24 hours ending 07/13/19 1726 There were no vitals filed for this visit.  Examination: General: Elderly male, in NAD. Neuro: Somnolent.  Opens eyes to noxious stimuli  Per RN, was moving all extremities earlier before additional round of medications. HEENT:  /AT. Sclerae anicteric. PERRL. Cardiovascular: RRR, no M/R/G.  Lungs: Respirations even and unlabored.  CTA bilaterally, No W/R/R. Abdomen: BS x 4, soft, NT/ND.  Musculoskeletal: No gross deformities, no edema.  Skin: Intact, warm, no rashes.  Assessment & Plan:   Abd pain - unclear etiology; though, concerning for cholecystitis.  CT negative but LFT's slightly up.  Had ERCP with sphincterotomy and pancreatic duct stenting on 09/25/18. - Assess GGT, RUQ [place]. - Would consult surgery in  AM. - Empiric ceftriaxone and flagyl for now. - Obtain blood cultures. - Assess PCT.  Agitation / Delirium - exacerbated by pain.  Had prior admission with suspicion for narcotic withdrawal. Hx chronic pain, chronic opioid use. - Additional 10mg  haldol, 2mg  ativan, 60mcg fentanyl now. - Ativan PRN anxiety. - Scheduled low dose fentanyl q4hrs to prevent withdrawal. - Scheduled low dose clonidine 0.1mg  TID short term for agitation. - If no improvement, can escalate to precedex. - Hold home duloxetine, norco, oxycontin, pregabalin, trazodone.  AMS. - F/u on head CT from ED.  OK for SDU admission under [place].  Nothing further to add.  PCCM will sign off.  Please do not hesitate to call us back if we can be of any further assistance.   Best Practice:  Diet:  NPO. Pain/Anxiety/Delirium protocol (if indicated): Fentanyl low dose scheduled as above.   VAP protocol (if indicated): N/A. DVT prophylaxis: Per primary. GI prophylaxis: Per primary. Glucose control: Per primary. Mobility: BR. Code Status: Full. Family Communication: None. Disposition: SDU.  Labs   CBC: Recent Labs  Lab 07/12/19 1320 07/13/19 1440 07/13/19 1530  WBC 7.5 7.0  --   NEUTROABS 6.5 5.4  --   HGB 13.4 13.7 12.2*  HCT 37.2* 38.2* 36.0*  MCV 93.5 93.9  --   PLT 111* 140*  --    Basic Metabolic Panel: Recent Labs  Lab 07/12/19 1320 07/13/19 1440 07/13/19 1530  NA 137 142 143  K 3.9 3.2* 3.2*  CL 105 108 111  CO2 23 18*  --   GLUCOSE 130* 168* 158*  BUN 18 28* 31*  CREATININE 0.64 1.01 0.70  CALCIUM 9.5 10.0  --    GFR: CrCl cannot be calculated (Unknown ideal weight.). Recent Labs  Lab 07/12/19 1320 07/13/19 1440 07/13/19 1500  WBC 7.5 7.0  --   LATICACIDVEN  --   --  3.4*   Liver Function Tests: Recent Labs  Lab 07/12/19 1320 07/13/19 1440  AST 27 42*  ALT 16 21  ALKPHOS 90 93  BILITOT 1.4* 2.1*  PROT 7.1 7.6  ALBUMIN 4.2 4.5   Recent Labs  Lab 07/12/19 1320 07/13/19 1440    LIPASE 17 18   No results for input(s): AMMONIA in the last 168 hours. ABG    Component Value Date/Time   TCO2 21 (L) 07/13/2019 1530    Coagulation Profile: No results for input(s): INR, PROTIME in the last 168 hours. Cardiac Enzymes: No results for input(s): CKTOTAL, CKMB, CKMBINDEX, TROPONINI in the last 168 hours. HbA1C: No results found for: HGBA1C CBG: No results for input(s): GLUCAP in the last 168 hours.  Review of Systems:   Unable to obtain as pt is somnolent.  Past medical history  He,  has a past medical history of Chronic back pain, Chronic low back pain (05/19/2017), Chronic, continuous use of opioids, Coronary artery calcification seen on CT scan, Nerve pain, and Thoracic aortic aneurysm ([place]).   Surgical History    Past Surgical History:  Procedure Laterality  Date   BACK SURGERY     09-2016   back surgey     ENDOSCOPIC RETROGRADE CHOLANGIOPANCREATOGRAPHY (ERCP) WITH PROPOFOL N/A 09/25/2018   Procedure: ENDOSCOPIC RETROGRADE CHOLANGIOPANCREATOGRAPHY (ERCP) WITH PROPOFOL;  Surgeon: Ronnette Juniper, MD;  Location: [facility];  Service: Gastroenterology;  Laterality: N/A;   PANCREATIC STENT PLACEMENT  09/25/2018   Procedure: PANCREATIC STENT PLACEMENT;  Surgeon: Ronnette Juniper, MD;  Location: [facility] ENDOSCOPY;  Service: Gastroenterology;;   REMOVAL OF STONES  09/25/2018   Procedure: REMOVAL OF STONES;  Surgeon: Ronnette Juniper, MD;  Location: [facility] ENDOSCOPY;  Service: Gastroenterology;;   [name]  09/25/2018   Procedure: [name];  Surgeon: Ronnette Juniper, MD;  Location: [facility] ENDOSCOPY;  Service: Gastroenterology;;     Social History   reports that he has never smoked. He has never used smokeless tobacco. He reports that he does not drink alcohol or use drugs.   Family history   His family history includes Cancer in his mother; Heart attack in his father.   Allergies No Known Allergies   Home meds  Prior to Admission medications   Medication Sig Start Date  End Date Taking? Authorizing Provider  allopurinol (ZYLOPRIM) 100 MG tablet TAKE 1 TABLET BY MOUTH EVERY DAY Patient taking differently: Take 100 mg by mouth daily as needed (gout flare up).  03/16/19   Kathrynn Ducking, MD  bisacodyl (DULCOLAX) 10 MG suppository Place 1 suppository (10 mg total) rectally as needed for moderate constipation. 10/26/17   Varney Biles, MD  calcium-vitamin D (OSCAL WITH D) 500-200 MG-UNIT tablet Take 1 tablet by mouth daily with breakfast.    [provider]  Diphenhydramine-APAP, sleep, (TYLENOL PM EXTRA STRENGTH PO) Take 1 tablet by mouth at bedtime.     [provider]  DULoxetine (CYMBALTA) 60 MG capsule TAKE 1 CAPSULE BY MOUTH TWICE A DAY Patient taking differently: Take 60 mg by mouth 2 (two) times daily.  07/01/19   Kathrynn Ducking, MD  HYDROcodone-acetaminophen (NORCO/VICODIN) 5-325 MG tablet Take 1 tablet by mouth every 6 (six) hours as needed for moderate pain. 05/03/19   Kathrynn Ducking, MD  MOVANTIK 25 MG TABS tablet Take 25 mg by mouth daily. 06/14/19   [provider]  omeprazole (PRILOSEC) 20 MG capsule Take 20 mg by mouth daily. 05/17/19   [provider]  oxyCODONE (OXYCONTIN) 40 mg 12 hr tablet Take 1 tablet (40 mg total) by mouth 3 (three) times daily. 05/03/19   Kathrynn Ducking, MD  pregabalin (LYRICA) 200 MG capsule Take 1 capsule (200 mg total) by mouth 3 (three) times daily. 04/13/19   Kathrynn Ducking, MD  promethazine (PHENERGAN) 25 MG tablet Take 25 mg by mouth every 8 (eight) hours as needed for nausea/vomiting. 06/15/19   [provider]  tamsulosin (FLOMAX) 0.4 MG CAPS capsule Take 0.4 mg by mouth daily.  08/29/15   [provider]  traZODone (DESYREL) 50 MG tablet Take 50 mg by mouth at bedtime. 05/17/19   [provider]     Montey Hora, [facility] 07/13/2019, 5:26 PM

## 2019-07-13 NOTE — ED Notes (Signed)
Pt still pulling off ekg wires  , pt keeps moving around in bed , sats remain 100 % on room air

## 2019-07-13 NOTE — ED Notes (Signed)
Admitting MD made aware of pt's blood pressure and unable to take po meds at this time

## 2019-07-13 NOTE — ED Notes (Signed)
Pt continues to be agitated

## 2019-07-14 ENCOUNTER — Inpatient Hospital Stay (HOSPITAL_COMMUNITY): Payer: Medicare Other

## 2019-07-14 ENCOUNTER — Other Ambulatory Visit (HOSPITAL_COMMUNITY): Payer: Medicare Other

## 2019-07-14 DIAGNOSIS — I249 Acute ischemic heart disease, unspecified: Secondary | ICD-10-CM

## 2019-07-14 DIAGNOSIS — R778 Other specified abnormalities of plasma proteins: Secondary | ICD-10-CM

## 2019-07-14 DIAGNOSIS — G9340 Encephalopathy, unspecified: Secondary | ICD-10-CM

## 2019-07-14 DIAGNOSIS — I10 Essential (primary) hypertension: Secondary | ICD-10-CM

## 2019-07-14 HISTORY — DX: Encephalopathy, unspecified: G93.40

## 2019-07-14 LAB — COMPREHENSIVE METABOLIC PANEL
ALT: 28 U/L (ref 0–44)
AST: 55 U/L — ABNORMAL HIGH (ref 15–41)
Albumin: 4.2 g/dL (ref 3.5–5.0)
Alkaline Phosphatase: 87 U/L (ref 38–126)
Anion gap: 12 (ref 5–15)
BUN: 23 mg/dL (ref 8–23)
CO2: 22 mmol/L (ref 22–32)
Calcium: 9.3 mg/dL (ref 8.9–10.3)
Chloride: 110 mmol/L (ref 98–111)
Creatinine, Ser: 0.74 mg/dL (ref 0.61–1.24)
GFR calc Af Amer: >60 mL/min (ref >60)
GFR calc non Af Amer: >60 mL/min (ref >60)
Glucose, Bld: 144 mg/dL — ABNORMAL HIGH (ref 70–99)
Potassium: 3.2 mmol/L — ABNORMAL LOW (ref 3.5–5.1)
Sodium: 144 mmol/L (ref 135–145)
Total Bilirubin: 1.5 mg/dL — ABNORMAL HIGH (ref 0.3–1.2)
Total Protein: 7 g/dL (ref 6.5–8.1)

## 2019-07-14 LAB — URINALYSIS, ROUTINE W REFLEX MICROSCOPIC
Bilirubin Urine: NEGATIVE
Glucose, UA: NEGATIVE mg/dL
Ketones, ur: 20 mg/dL — AB
Leukocytes,Ua: NEGATIVE
Nitrite: NEGATIVE
Protein, ur: 30 mg/dL — AB
Specific Gravity, Urine: 1.021 (ref 1.005–1.030)
pH: 5 (ref 5.0–8.0)

## 2019-07-14 LAB — CBC
HCT: 36.9 % — ABNORMAL LOW (ref 39.0–52.0)
Hemoglobin: 13.2 g/dL (ref 13.0–17.0)
MCH: 33.7 pg (ref 26.0–34.0)
MCHC: 35.8 g/dL (ref 30.0–36.0)
MCV: 94.1 fL (ref 80.0–100.0)
Platelets: 105 10*3/uL — ABNORMAL LOW (ref 150–400)
RBC: 3.92 MIL/uL — ABNORMAL LOW (ref 4.22–5.81)
RDW: 13.5 % (ref 11.5–15.5)
WBC: 8.1 10*3/uL (ref 4.0–10.5)
nRBC: 0 % (ref 0.0–0.2)

## 2019-07-14 LAB — HEPATIC FUNCTION PANEL
ALT: 29 U/L (ref 0–44)
AST: 54 U/L — ABNORMAL HIGH (ref 15–41)
Albumin: 4.2 g/dL (ref 3.5–5.0)
Alkaline Phosphatase: 91 U/L (ref 38–126)
Bilirubin, Direct: 0.3 mg/dL — ABNORMAL HIGH (ref 0.0–0.2)
Indirect Bilirubin: 1.4 mg/dL — ABNORMAL HIGH (ref 0.3–0.9)
Total Bilirubin: 1.7 mg/dL — ABNORMAL HIGH (ref 0.3–1.2)
Total Protein: 7.1 g/dL (ref 6.5–8.1)

## 2019-07-14 LAB — TROPONIN I (HIGH SENSITIVITY)
Troponin I (High Sensitivity): 684 ng/L (ref <18)
Troponin I (High Sensitivity): 1004 ng/L (ref <18)
Troponin I (High Sensitivity): 974 ng/L (ref <18)

## 2019-07-14 LAB — PROCALCITONIN: Procalcitonin: <0.1 ng/mL

## 2019-07-14 LAB — GAMMA GT: GGT: 13 U/L (ref 7–50)

## 2019-07-14 MED ORDER — POTASSIUM CHLORIDE 10 MEQ/100ML IV SOLN
10.0000 meq | INTRAVENOUS | Status: AC
  Administered 2019-07-14 (×4): 10 meq via INTRAVENOUS
  Filled 2019-07-14 (×4): qty 100

## 2019-07-14 MED ORDER — METOPROLOL TARTRATE 5 MG/5ML IV SOLN
5.0000 mg | Freq: Four times a day (QID) | INTRAVENOUS | Status: DC
  Administered 2019-07-14 – 2019-07-15 (×5): 5 mg via INTRAVENOUS
  Filled 2019-07-14 (×4): qty 5

## 2019-07-14 MED ORDER — ASPIRIN 300 MG RE SUPP
300.0000 mg | Freq: Once | RECTAL | Status: AC
  Administered 2019-07-14: 300 mg via RECTAL
  Filled 2019-07-14: qty 1

## 2019-07-14 NOTE — ED Notes (Signed)
PATIENT FAMILY CONTACT: Dianah Field [phone]

## 2019-07-14 NOTE — ED Notes (Signed)
Patient noted to be extremely restless. This RN advised by sitter that patient had not urinated since arrival to ED. Bladder scan to be completed.

## 2019-07-14 NOTE — ED Notes (Signed)
Removed pt's left wrist restraint. Pt resting at this time.

## 2019-07-14 NOTE — ED Notes (Signed)
This tech and RN attempted to take patient's blood pressure both manually and automatically. We were only successful in getting a systoltic of 200 manually. Pt. Could not stay still, fidgety.

## 2019-07-14 NOTE — ED Notes (Signed)
Admitting provider paged for COWS score again.

## 2019-07-14 NOTE — ED Notes (Signed)
Attending physician paged of pt's COWS score of 39 @ 0508 without acknowledgement, call back or new orders.

## 2019-07-14 NOTE — ED Notes (Signed)
CCM / Warren Lacy Dr. Oletta Darter paged regarding patient's condition. Per MD, hold Ativan and use Fentanyl and Haldol to help ease patient. Per MD do not give all medication at once. Titrate as needed and monitor vital signs.

## 2019-07-14 NOTE — Progress Notes (Signed)
  Echocardiogram 2D Echocardiogram has been performed.  Darlina Sicilian M 07/14/2019, 4:04 PM

## 2019-07-14 NOTE — Progress Notes (Signed)
PROGRESS NOTE  Riley Eaton [id number] DOB: Jan 03, 1941 DOA: 07/13/2019 PCP: Aletha Halim., PA-C  Brief History   The patient is a 78 yr old man who presented to [facility] ED on 07/13/2019 with complaints of right upper quadrant pain. He had been seen at [facility] on 07/12/2019 for the same complaints. CT of the abdomen and pelvis on 07/12/2019 demonstrated cholelithiasis, but no evidence of cholecystitis. Surgery was consulted, and they felt that this could be managed as outpatient. The patient was sent home.   He returned to [facility] ED on 06/13/2019 for increase in abdominal pain, nausea and vomiting. They patient also was very agitated upon returning. Per his wife he is acting as he did last summer when he had cholelithiasis requiring ERCP and stone retrieval. The patient apparently characterized the pain as severe at that time. It was right upper quadrant in location. No palliative or provocative factors. Right upper quadrant ultrasound was performed in the ED on 07/13/2019. It demonstrated bilary sludge and a biliary sludge ball in the gallbladder. No findings to suggest an acute cholecystitis at this time.   [facility] were consulted to admit the patient for further evaluation and treatment. Overnight the patient has become more delirious. He is very agitated this morning and cannot follow commands.   Initial troponin was 17 on presentation to [facility] ED. It has increased to 684. The patient has received an aspirin and metoprolol. Cardiology has been consulted and has ordered an echocardiogram.  LFT's remain elevated. Will consider HIDA Scan, GI/General surgery consult pending result of cardiac work up.    Consultants  . Cardiology  Procedures  . None  Antibiotics   Anti-infectives (From admission, onward)   Start     Dose/Rate Route Frequency Ordered Stop   07/13/19 1900  cefTRIAXone (ROCEPHIN) 1 g in sodium chloride 0.9 % 100 mL IVPB     1 g 200 mL/hr over 30 Minutes Intravenous  Every 24 hours 07/13/19 1825     07/13/19 1900  metroNIDAZOLE (FLAGYL) IVPB 500 mg     500 mg 100 mL/hr over 60 Minutes Intravenous Every 8 hours 07/13/19 1825      .  Subjective  The patient is lying on a gurney in wrist restraints. He does no respond to me verbally. He is moving all extremities. He is not following commands.  Objective   Vitals:  Vitals:   07/14/19 0900 07/14/19 1100  BP: (!) 152/91 (!) 186/125  Pulse: 89 86  Resp: 19 20  Temp:    SpO2: 99% 99%   Exam:  Constitutional:  . The patient is agitated, but lying with his eyes closed. He is moving about in bed. He does not follow commands or answer questions. Eyes:  [name] Eyes squeezes tightly shut when I try to open them. He does not do so when asked.  Respiratory:  . No increased work of breathing. . No wheezes, rales, or rhonchi . Lung sounds are diminished bilaterally . No tactile fremitus Cardiovascular:  . Regular rate and rhythm . No murmurs, ectopy, or gallups. . No lateral PMI. No thrills. Abdomen:  . Abdomen is soft, non-tender, non-distended . Abdomen is scaffoid. . No hernias, masses, or organomegaly . Hypoactive bowel sounds.  Musculoskeletal:  . No cyanosis, clubbing, or edema . Cachectic Skin:  . No rashes, lesions, ulcers . palpation of skin: no induration or nodules Neurologic:  . Moving all extremities . Unable to evaluate as the patient is unable to cooperate with exam.  Psychiatric:  . Unable to evaluate as the patient is unable to cooperate with exam.  I have personally reviewed the following:   Today's Data  . Vitals, CMP, Troponins, CBC  Cardiology Data  . EKG (07/13/2019, 07/14/2019)  Other Data  . CT Abdomen, Right upper quadrant ultrasound.  Scheduled Meds: . cloNIDine  0.1 mg Oral TID  . DULoxetine  60 mg Oral BID  . enoxaparin (LOVENOX) injection  40 mg Subcutaneous Q24H  . fentaNYL (SUBLIMAZE) injection  25 mcg Intravenous Q4H  . [START ON 07/15/2019] naloxegol  oxalate  25 mg Oral Daily  . oxyCODONE  40 mg Oral TID  . pantoprazole  40 mg Oral Daily  . pregabalin  200 mg Oral TID  . tamsulosin  0.4 mg Oral Daily  . traZODone  50 mg Oral QHS   Continuous Infusions: . cefTRIAXone (ROCEPHIN)  IV Stopped (07/13/19 2226)  . metronidazole 500 mg (07/14/19 1050)  . potassium chloride 10 mEq (07/14/19 1114)    Active Problems:   Chronic low back pain   Choledocholithiasis   Acute metabolic encephalopathy   Mental status alteration   LOS: 1 day   A & P   NSTEMI: Troponins have elevated from 17 to 684 to 1000. EKG earlier today demonstrated septal st segment depressions.Cardiology has been consulted and is checking an echocardiogram. The patient has been given an aspirin and IV lopressor. This could possibly be the cause of the patient's "right upper quadrant" abdominal pain. Continue to monitor on telemetry. I have discussed the patient with Dr. Margaretann Loveless. The patient will have an echocardiogram and will be treated conservatively for now. Pt will need a step down bed.   Cholangitis/Cholecystitis ([place]?): Pt does not appear septic. No fever, no elevation of WBC, normal lactic acid, hypertention with a mild elevation of heart rate. Procalcitonin is negative. Rt upper quadrant ultrasound demonstrates no gallbladder wall thickening or pericholecystic edema. It does demonstrate sludge and a "sludge ball". No CBD dilatation is noted. LFT's are mildly elevated. The patient will need MRCP and HIDA scan, but this will have to wait until cardiac issues are sorted out.   Delirium: Patient is agitated and confused. He is requiring restraints. He is not verbally communicative with me and will not follow commands. I do not believe that he is able to cooperate with any procedure or study at this time. Delirium is likely due to acute illness. His wife has asked if he could have visitors. I have told her that I believe that it may be helpful to him to have a family  member at bedside. His wife stated that their daughter may come.   Hypertension: IV metoprolol. Monitor blood pressure and heart rate.  History of thoracic aortic aneurysm: Noted.   I have seen and examined this patient myself. I have spent 54 minutes in his evaluation and care. I have spent more than 50% of this in coordination of care with Dr. Margaretann Loveless and in consultation with the patient's wife. I have answered all questions as possible.  CODE STATUS: Full DVT Prophylaxis: Heparin gtt as per cardiology Family Communication: I have spoken to the patient's wife, [name], at length regarding the patient. All questions answered to the best of my ability. Disposition: tbd  Akacia Boltz, DO [facility] Direct contact: see [email or web address]  7PM-7AM contact night coverage as above 07/14/2019,  11:38 AM  LOS: 1 day

## 2019-07-14 NOTE — ED Notes (Addendum)
Attempted to obtain pt's blood pressure on all extremities without success. Will try after foley catheter when patient is more relaxed.

## 2019-07-14 NOTE — ED Notes (Signed)
Pt restraints changed to non-violent.

## 2019-07-14 NOTE — Consult Note (Addendum)
Cardiology Consultation:   Patient ID: DOUA CONDELLO MRN: [id number]; DOB: 25-Jul-1941  Admit date: 07/13/2019 Date of Consult: 07/14/2019  Primary Care Provider: Aletha Halim., PA-C Primary Cardiologist: No primary care provider on file.  Primary Electrophysiologist:  None    Patient Profile:   Riley Eaton is a 78 y.o. male with a hx of chronic pain, opoid use, prior pancreatic stent placement, thoracic aortic aneurysm, coronary artery calcification seen on chest CT who is being seen today for the evaluation of elevated troponin in the setting of AMS and chololithiasis at the request of Dr. Benny Lennert.  History of Present Illness:   History was obtained through chart review.  Mr. Serritella has been seen by Dr. Gwenlyn Found in the past. He was referred to cardiology in 2017 for the incidental finding of thoracic aortic aneurysm of 4.1 cm and shortness of breath. Coronary artery calcifications were also seen on the  CT scan. A Myoview stress test was ordered which was normal. He has no known history of MI, stent, or heart failure. His last visit was 12/07/18 and he was doing well from a cardiac standpoint.   Patient had an admission in February 2020 for acute ascending cholangitis s/p ERCP and sphincterotomy with stenting of pancreatic duct. He did have delirium 2/2 to narcotic withdrawal. He was scheduled for gallbladder removal but it was postponed for COVID.  On 11/30 the patient went to the ED at [facility] for abdominal pain found to have distended gallbladder with cholelithiasis but no acute cholcystitis.  Surgery was consulted who recommended further outpatient follow-up and the patient was discharged.   The patient returned to the [facility] ED the next day 07/13/19 for AMS. EMS was called when the abdominal pain worsened and he became confused. In the ED BP 157/118, pulse 106, afebrile, RR 24, O100% O2. He appeared to be oriented to self on initial exam with diffuse abdominal tenderness. Labs  showed potassium 3.2, CO2 18, glucose 168, Bun 28, creatinine 1.01, AST 42, ALT 21 total bili 2.1. lactate elevated to 3.4. No leukocytosis. Initial HS troponin 17. Patient was given dilaudid, haldol, ativan and was still agitated. CCM was consulted for possible opoid withdrawal and precedex. RUQ US showed sludge but no acute cholecystitis. CT angio of abdominal and head was ordered. Empiric abx started.  Second troponin came back at 684 and cardiology was consulted.  On my exam patient is awake and agitated, not oriented.   Heart Pathway Score:     Past Medical History:  Diagnosis Date   Chronic back pain    Chronic low back pain 05/19/2017   Chronic, continuous use of opioids    for back pain   Coronary artery calcification seen on CT scan    Nerve pain    Thoracic aortic aneurysm [facility])     Past Surgical History:  Procedure Laterality Date   BACK SURGERY     09-2016   back surgey     ENDOSCOPIC RETROGRADE CHOLANGIOPANCREATOGRAPHY (ERCP) WITH PROPOFOL N/A 09/25/2018   Procedure: ENDOSCOPIC RETROGRADE CHOLANGIOPANCREATOGRAPHY (ERCP) WITH PROPOFOL;  Surgeon: Ronnette Juniper, MD;  Location: [place];  Service: Gastroenterology;  Laterality: N/A;   PANCREATIC STENT PLACEMENT  09/25/2018   Procedure: PANCREATIC STENT PLACEMENT;  Surgeon: Ronnette Juniper, MD;  Location: [facility] ENDOSCOPY;  Service: Gastroenterology;;   REMOVAL OF STONES  09/25/2018   Procedure: REMOVAL OF STONES;  Surgeon: Ronnette Juniper, MD;  Location: [place];  Service: Gastroenterology;;   [name]  09/25/2018   Procedure:  SPHINCTEROTOMY;  Surgeon: Ronnette Juniper, MD;  Location: [facility] ENDOSCOPY;  Service: Gastroenterology;;     Home Medications:  Prior to Admission medications   Medication Sig Start Date End Date Taking? Authorizing Provider  allopurinol (ZYLOPRIM) 100 MG tablet TAKE 1 TABLET BY MOUTH EVERY DAY Patient taking differently: Take 100 mg by mouth daily as needed (gout flare up).  03/16/19   Kathrynn Ducking, MD  bisacodyl (DULCOLAX) 10 MG suppository Place 1 suppository (10 mg total) rectally as needed for moderate constipation. 10/26/17   Varney Biles, MD  calcium-vitamin D (OSCAL WITH D) 500-200 MG-UNIT tablet Take 1 tablet by mouth daily with breakfast.    [provider]  Diphenhydramine-APAP, sleep, (TYLENOL PM EXTRA STRENGTH PO) Take 1 tablet by mouth at bedtime.     [provider]  DULoxetine (CYMBALTA) 60 MG capsule TAKE 1 CAPSULE BY MOUTH TWICE A DAY Patient taking differently: Take 60 mg by mouth 2 (two) times daily.  07/01/19   Kathrynn Ducking, MD  HYDROcodone-acetaminophen (NORCO/VICODIN) 5-325 MG tablet Take 1 tablet by mouth every 6 (six) hours as needed for moderate pain. 05/03/19   Kathrynn Ducking, MD  MOVANTIK 25 MG TABS tablet Take 25 mg by mouth daily. 06/14/19   [provider]  omeprazole (PRILOSEC) 20 MG capsule Take 20 mg by mouth daily. 05/17/19   [provider]  oxyCODONE (OXYCONTIN) 40 mg 12 hr tablet Take 1 tablet (40 mg total) by mouth 3 (three) times daily. 05/03/19   Kathrynn Ducking, MD  pregabalin (LYRICA) 200 MG capsule Take 1 capsule (200 mg total) by mouth 3 (three) times daily. 04/13/19   Kathrynn Ducking, MD  promethazine (PHENERGAN) 25 MG tablet Take 25 mg by mouth every 8 (eight) hours as needed for nausea/vomiting. 06/15/19   [provider]  tamsulosin (FLOMAX) 0.4 MG CAPS capsule Take 0.4 mg by mouth daily.  08/29/15   [provider]  traZODone (DESYREL) 50 MG tablet Take 50 mg by mouth at bedtime. 05/17/19   [provider]    Inpatient Medications: Scheduled Meds:  cloNIDine  0.1 mg Oral TID   DULoxetine  60 mg Oral BID   enoxaparin (LOVENOX) injection  40 mg Subcutaneous Q24H   fentaNYL (SUBLIMAZE) injection  25 mcg Intravenous Q4H   [START ON 07/15/2019] naloxegol oxalate  25 mg Oral Daily   oxyCODONE  40 mg Oral TID   pantoprazole  40 mg Oral Daily   pregabalin  200  mg Oral TID   tamsulosin  0.4 mg Oral Daily   traZODone  50 mg Oral QHS   Continuous Infusions:  cefTRIAXone (ROCEPHIN)  IV Stopped (07/13/19 2226)   metronidazole 500 mg (07/14/19 1050)   potassium chloride Stopped (07/14/19 1053)   PRN Meds: allopurinol, haloperidol lactate, hydrALAZINE, ketorolac, LORazepam  Allergies:   No Known Allergies  Social History:   Social History   Socioeconomic History   Marital status: Married    Spouse name: Not on file   Number of children: 3   Years of education: 12   Highest education level: Not on file  Occupational History   Not on file  Social Needs   Financial resource strain: Not on file   Food insecurity    Worry: Not on file    Inability: Not on file   Transportation needs    Medical: Not on file    Non-medical: Not on file  Tobacco Use   Smoking status: Never Smoker  Smokeless tobacco: Never Used  Substance and Sexual Activity   Alcohol use: No   Drug use: No   Sexual activity: Not on file  Lifestyle   Physical activity    Days per week: Not on file    Minutes per session: Not on file   Stress: Not on file  Relationships   Social connections    Talks on phone: Not on file    Gets together: Not on file    Attends religious service: Not on file    Active member of club or organization: Not on file    Attends meetings of clubs or organizations: Not on file    Relationship status: Not on file   Intimate partner violence    Fear of current or ex partner: Not on file    Emotionally abused: Not on file    Physically abused: Not on file    Forced sexual activity: Not on file  Other Topics Concern   Not on file  Social History Narrative   Lives with wife   Caffeine use: Drinks 6 cups coffee per day   Right handed     Family History:   Family History  Problem Relation Age of Onset   Cancer Mother    Heart attack Father      ROS:  Please see the history of present illness.  All other  ROS reviewed and negative.     Physical Exam/Data:   Vitals:   07/14/19 0800 07/14/19 0815 07/14/19 0845 07/14/19 0900  BP: (!) 171/97 (!) 170/99 (!) 165/94 (!) 152/91  Pulse: 100 99 81 89  Resp: 20 19 19 19   Temp:      TempSrc:      SpO2: 98% 97% 98% 99%    Intake/Output Summary (Last 24 hours) at 07/14/2019 1109 Last data filed at 07/14/2019 0917 Gross per 24 hour  Intake 100 ml  Output 1000 ml  Net -900 ml   Last 3 Weights 02/01/2019 09/25/2018 07/06/2018  Weight (lbs) 162 lb 158 lb 158 lb  Weight (kg) 73.483 kg 71.668 kg 71.668 kg     There is no height or weight on file to calculate BMI.  General:  Frail WM; agitated; diaphoretic HEENT: normal Lymph: no adenopathy Neck: minimal JVD Endocrine:  No thryomegaly Vascular: No carotid bruits; FA pulses 2+ bilaterally without bruits  Cardiac:  normal S1, S2; tachycardic; no murmur  Lungs:  clear to auscultation bilaterally, no wheezing, rhonchi or rales  Abd: difficult to assess given mental state Ext: no edema Musculoskeletal:  No deformities, BUE and BLE strength normal and equal Skin: warm and dry  Neuro:  CNs 2-12 intact, no focal abnormalities noted Psych:  Normal affect   EKG:  The EKG was personally reviewed and demonstrates:  Sinus tachycardia, 109 bmp, minimal ST depression ant leads Telemetry:  Telemetry was personally reviewed and demonstrates:  NSR and sinus tachycardia, HR 90-110, occasional PVCs  Relevant CV Studies:  Myoview 10/20/2015 Study Highlights   The left ventricular ejection fraction is normal (55-65%).  Nuclear stress EF: 57%.  There was no ST segment deviation noted during stress.  The study is normal.  This is a low risk study.   Normal pharmacologic nuclear stress test with no prior infarct and no ischemia.     Laboratory Data:  High Sensitivity Troponin:   Recent Labs  Lab 07/13/19 1440 07/14/19 0757  TROPONINIHS 17 684*     Chemistry Recent Labs  Lab 07/12/19 1320  07/13/19  1440 07/13/19 1530 07/14/19 0757  NA 137 142 143 144  K 3.9 3.2* 3.2* 3.2*  CL 105 108 111 110  CO2 23 18*  --  22  GLUCOSE 130* 168* 158* 144*  BUN 18 28* 31* 23  CREATININE 0.64 1.01 0.70 0.74  CALCIUM 9.5 10.0  --  9.3  GFRNONAA >60 >60  --  >60  GFRAA >60 >60  --  >60  ANIONGAP 9 16*  --  12    Recent Labs  Lab 07/12/19 1320 07/13/19 1440 07/14/19 0757  PROT 7.1 7.6 7.1   7.0  ALBUMIN 4.2 4.5 4.2   4.2  AST 27 42* 54*   55*  ALT 16 21 29   28   ALKPHOS 90 93 91   87  BILITOT 1.4* 2.1* 1.7*   1.5*   Hematology Recent Labs  Lab 07/12/19 1320 07/13/19 1440 07/13/19 1530 07/14/19 0757  WBC 7.5 7.0  --  8.1  RBC 3.98* 4.07*  --  3.92*  HGB 13.4 13.7 12.2* 13.2  HCT 37.2* 38.2* 36.0* 36.9*  MCV 93.5 93.9  --  94.1  MCH 33.7 33.7  --  33.7  MCHC 36.0 35.9  --  35.8  RDW 13.3 13.4  --  13.5  PLT 111* 140*  --  105*   BNPNo results for input(s): BNP, PROBNP in the last 168 hours.  DDimer No results for input(s): DDIMER in the last 168 hours.   Radiology/Studies:  Ct Abdomen Pelvis W Contrast  Result Date: 07/12/2019 CLINICAL DATA:  Abdominal pain. Right upper quadrant abdominal pain with low back pain. EXAM: CT ABDOMEN AND PELVIS WITH CONTRAST TECHNIQUE: Multidetector CT imaging of the abdomen and pelvis was performed using the standard protocol following bolus administration of intravenous contrast. CONTRAST:  166mL OMNIPAQUE IOHEXOL 300 MG/ML  SOLN COMPARISON:  09/25/2018 FINDINGS: Lower chest: The lung bases are clear. The heart size is normal. Hepatobiliary: The liver is normal. The gallbladder is distended. There is cholelithiasis.There is no biliary ductal dilation. Pancreas: The pancreas is atrophic. Spleen: The spleen is borderline enlarged measuring approximately 12.5 cm craniocaudad. The splenic vein is patent. Adrenals/Urinary Tract: --Adrenal glands: No adrenal hemorrhage. --Right kidney/ureter: No hydronephrosis or perinephric hematoma. --Left  kidney/ureter: No hydronephrosis or perinephric hematoma. --Urinary bladder: The urinary bladder is distended. Stomach/Bowel: --Stomach/Duodenum: There is a large hiatal hernia. --Small bowel: No dilatation or inflammation. --Colon: No focal abnormality. --Appendix: Normal. Vascular/Lymphatic: Atherosclerotic calcification is present within the non-aneurysmal abdominal aorta, without hemodynamically significant stenosis. --No retroperitoneal lymphadenopathy. --No mesenteric lymphadenopathy. --No pelvic or inguinal lymphadenopathy. Reproductive: The prostate gland is enlarged. Other: No ascites or free air. The abdominal wall is normal. Musculoskeletal. There are advanced degenerative changes throughout the visualized lumbar spine, greatest at the L3-L4 level. There is a degenerative dextroscoliosis centered at the L2-L3 level. There is no displaced fracture. IMPRESSION: 1. Distended gallbladder with cholelithiasis. No biliary ductal dilation. No CT evidence for acute cholecystitis. 2. Borderline splenomegaly. 3. Large hiatal hernia. 4. Aortic atherosclerosis. Aortic Atherosclerosis (ICD10-I70.0). Electronically Signed   By: Constance Holster M.D.   On: 07/12/2019 16:06   Dg Abd Portable 1 View  Result Date: 07/13/2019 CLINICAL DATA:  Abdominal pain. EXAM: PORTABLE ABDOMEN - 1 VIEW COMPARISON:  09/28/2018 FINDINGS: Bowel gas pattern is normal. Severe degenerative changes in the lumbar spine at L3-4. Contrast in the bladder from the CT scan performed yesterday. IMPRESSION: Benign-appearing abdomen. Electronically Signed   By: Lorriane Shire M.D.   On: 07/13/2019 17:41  US Abdomen Limited Ruq  Result Date: 07/13/2019 CLINICAL DATA:  78 year old male with history of abdominal pain. EXAM: ULTRASOUND ABDOMEN LIMITED RIGHT UPPER QUADRANT COMPARISON:  Right upper quadrant abdominal ultrasound 09/25/2018. FINDINGS: Gallbladder: Amorphous nonshadowing echogenic material in the gallbladder, compatible with biliary  sludge. Additional 9 mm echogenic focus with no posterior acoustic shadowing lying dependently, compatible with a sludge ball. Gallbladder is moderately distended. Gallbladder wall thickness is normal at 2.7 mm. No pericholecystic fluid. Per report from the sonographer there was no sonographic Murphy's sign on examination. Common bile duct: Diameter: 3.7 mm Liver: No focal lesion identified. Within normal limits in parenchymal echogenicity. Portal vein is patent on color Doppler imaging with normal direction of blood flow towards the liver. Other: None. IMPRESSION: 1. Biliary sludge and biliary sludge ball in the gallbladder. No findings to suggest an acute cholecystitis at this time. Electronically Signed   By: Vinnie Langton M.D.   On: 07/13/2019 19:55    Assessment and Plan:   Abdominal Pain H/o of gallstones with ERCP and stenting earlier this year, was unable to follow up this summer due to covid. Second ER visit this week for abdominal pain. CT abd and RUQ [place] negative for acute cholecystitis, but showed gallstones. LFTs minimally elevated. WBC normal. Lactate elevated. Procalcitonin normal. UA showed Hgb, ketones, protein, and bacteria. - abx started per CCM - Blood cultures pending - CT angio chest and abd for possible dissection - HIDA scan  - General surgery consulted  AMS/Agitation Patient was brought for confusion and agitation via EMS. He was given dilaudid, haldol, ativan, fentanyl. H/o of chronic pain with opoid use. - AMS possibly from narcotic withdrawal vs the above problem - Head CT ordered - management per IM/CCM  Elevated troponin Patient with h/o of thoracic aortic aneurysm and coronary calcifications seen on CT. Myoview in 2017 was normal. EF normal. - On admission HS troponin 17 > 684>1,004 . Could be demand ischemia in the setting of possible cholecystitis and narcotic withdrawal - Patient with no h/o of chest pain according to chart review - Will check an echo -  continue to trend troponin - Do not think patient can be taken for cardiac procedure given his acute state. Would acute illness before considering further ischemic evaluation once patient is stable.  Chronic narcotic use - was on oxycotin 40 mg TID, vicodin 5 q6hours - management per IM   HTN - not on meds at baseline - Pressures elevated on admission, systolics up to [id number] - started on clonidine - CT angio chest/abdomen ordered to evaluate for dissection.    For questions or updates, please contact [place] Please consult [email or web address] for contact info under     Signed, Cadence Ninfa Meeker, PA-C  07/14/2019 11:09 AM   ---------------------------------------------------------------------------------------------   History and all data above reviewed.  Patient examined.  I agree with the findings as above.  Sayf R Ramsier is a 78 year old gentleman who presents with abdominal pain and cholelithiasis in the setting of a recent history of cholangitis status post ERCP and sphincterotomy.  He was found to have an elevated troponin for which we were consulted.  Gen:  arousable but agitated CV: regular rhythm, normal rate, no appreciable murmurs Lungs: clear Abd: soft, no guarding Extrem: warm, well perfused, no edema Neuro: somnolent Psych: unable to assess  All available labs, radiology testing, previous records reviewed. Agree with documented assessment and plan of my colleague as stated above with the following additions or changes:  Active Problems:  Chronic low back pain   Choledocholithiasis   Acute metabolic encephalopathy   Mental status alteration   ACS (acute coronary syndrome) ([place])    Plan: This is a challenging situation given that the patient has altered mental status.  He is agitated, and would not be a good candidate for cardiac catheterization for his safety.  I have reviewed a CT angio of the chest which demonstrates proximal left-sided coronary artery  disease.  He has elevated troponins.  His primary presentation appears GI in origin.  His elevation in troponins may be a result of demand ischemia.  Given that he is not an ideal candidate for the Cath Lab at this time, it would be best to obtain an echocardiogram to restratify in the setting of an elevated troponin.  In addition if he were to require cholecystectomy, dual antiplatelet therapy would likely need to be held.  In light of this we will pursue initial medical management.  He is hypertensive which may also be contributing to the demand.  Antibiotics have been started by to critical care, if there are concerns for sepsis, would recommend caution using beta-blockers and vasodilators.  Aspirin can be initiated. Length of Stay:  LOS: 1 day   Elouise Munroe, MD [facility] 5:39 PM  07/14/2019

## 2019-07-14 NOTE — ED Notes (Signed)
Attending provider paged about bladder scan of 999. Order to place foley catheter given verbally.

## 2019-07-14 NOTE — ED Notes (Signed)
ECHO at bedside.

## 2019-07-14 NOTE — ED Notes (Signed)
Date and time results received: 07/14/19 10 (use smartphrase ".now" to insert current time)  Test: troponin Critical Value: 684  Name of Provider Notified: Swayze  Orders Received? Or Actions Taken?:

## 2019-07-14 NOTE — ED Notes (Signed)
Pt continues to move all over stretcher.  Very agitated.  Condom cath remains in place, no urine output at this time

## 2019-07-14 NOTE — ED Notes (Signed)
Attending ordered this RN to give Ativan ordered. If no improvement to page the CCM on call.

## 2019-07-15 ENCOUNTER — Inpatient Hospital Stay (HOSPITAL_COMMUNITY): Payer: Medicare Other

## 2019-07-15 DIAGNOSIS — Z7189 Other specified counseling: Secondary | ICD-10-CM

## 2019-07-15 DIAGNOSIS — Z515 Encounter for palliative care: Secondary | ICD-10-CM

## 2019-07-15 DIAGNOSIS — R109 Unspecified abdominal pain: Secondary | ICD-10-CM

## 2019-07-15 LAB — TROPONIN I (HIGH SENSITIVITY)
Troponin I (High Sensitivity): 2079 ng/L (ref <18)
Troponin I (High Sensitivity): 2110 ng/L (ref <18)

## 2019-07-15 LAB — BASIC METABOLIC PANEL
Anion gap: 14 (ref 5–15)
BUN: 33 mg/dL — ABNORMAL HIGH (ref 8–23)
CO2: 22 mmol/L (ref 22–32)
Calcium: 9.7 mg/dL (ref 8.9–10.3)
Chloride: 111 mmol/L (ref 98–111)
Creatinine, Ser: 0.78 mg/dL (ref 0.61–1.24)
GFR calc Af Amer: >60 mL/min (ref >60)
GFR calc non Af Amer: >60 mL/min (ref >60)
Glucose, Bld: 137 mg/dL — ABNORMAL HIGH (ref 70–99)
Potassium: 4 mmol/L (ref 3.5–5.1)
Sodium: 147 mmol/L — ABNORMAL HIGH (ref 135–145)

## 2019-07-15 LAB — CBC WITH DIFFERENTIAL/PLATELET
Abs Immature Granulocytes: 0.11 10*3/uL — ABNORMAL HIGH (ref 0.00–0.07)
Basophils Absolute: 0 10*3/uL (ref 0.0–0.1)
Basophils Relative: 0 %
Eosinophils Absolute: 0 10*3/uL (ref 0.0–0.5)
Eosinophils Relative: 0 %
HCT: 39.2 % (ref 39.0–52.0)
Hemoglobin: 14.5 g/dL (ref 13.0–17.0)
Immature Granulocytes: 1 %
Lymphocytes Relative: 6 %
Lymphs Abs: 0.7 10*3/uL (ref 0.7–4.0)
MCH: 34 pg (ref 26.0–34.0)
MCHC: 37 g/dL — ABNORMAL HIGH (ref 30.0–36.0)
MCV: 91.8 fL (ref 80.0–100.0)
Monocytes Absolute: 1.1 10*3/uL — ABNORMAL HIGH (ref 0.1–1.0)
Monocytes Relative: 8 %
Neutro Abs: 10.7 10*3/uL — ABNORMAL HIGH (ref 1.7–7.7)
Neutrophils Relative %: 85 %
Platelets: 138 10*3/uL — ABNORMAL LOW (ref 150–400)
RBC: 4.27 MIL/uL (ref 4.22–5.81)
RDW: 13.3 % (ref 11.5–15.5)
WBC: 12.6 10*3/uL — ABNORMAL HIGH (ref 4.0–10.5)
nRBC: 0 % (ref 0.0–0.2)

## 2019-07-15 LAB — COMPREHENSIVE METABOLIC PANEL
ALT: 39 U/L (ref 0–44)
AST: 74 U/L — ABNORMAL HIGH (ref 15–41)
Albumin: 4.2 g/dL (ref 3.5–5.0)
Alkaline Phosphatase: 92 U/L (ref 38–126)
Anion gap: 14 (ref 5–15)
BUN: 25 mg/dL — ABNORMAL HIGH (ref 8–23)
CO2: 19 mmol/L — ABNORMAL LOW (ref 22–32)
Calcium: 9.3 mg/dL (ref 8.9–10.3)
Chloride: 110 mmol/L (ref 98–111)
Creatinine, Ser: 0.69 mg/dL (ref 0.61–1.24)
GFR calc Af Amer: >60 mL/min (ref >60)
GFR calc non Af Amer: >60 mL/min (ref >60)
Glucose, Bld: 127 mg/dL — ABNORMAL HIGH (ref 70–99)
Potassium: 3 mmol/L — ABNORMAL LOW (ref 3.5–5.1)
Sodium: 143 mmol/L (ref 135–145)
Total Bilirubin: 1.9 mg/dL — ABNORMAL HIGH (ref 0.3–1.2)
Total Protein: 6.9 g/dL (ref 6.5–8.1)

## 2019-07-15 LAB — LACTIC ACID, PLASMA
Lactic Acid, Venous: 1.3 mmol/L (ref 0.5–1.9)
Lactic Acid, Venous: 1 mmol/L (ref 0.5–1.9)

## 2019-07-15 LAB — ECHOCARDIOGRAM COMPLETE

## 2019-07-15 LAB — MAGNESIUM: Magnesium: 2.2 mg/dL (ref 1.7–2.4)

## 2019-07-15 IMAGING — CT CT HEAD W/O CM
3 of 6 series · 16 of 47 positions shown, 19 images · non-contrast
Comparison: None.

CLINICAL DATA: Altered level of consciousness (LOC), unexplained

EXAM:
CT HEAD WITHOUT CONTRAST
TECHNIQUE: Contiguous axial images were obtained from the base of the skull
through the vertex without intravenous contrast.

[Series 2: head 5.0 h30s · axial · 0.45mm/px · z∈[-58,+92]mm · 11 of 36 slices shown, 14 images]
[im 3/36  brain]
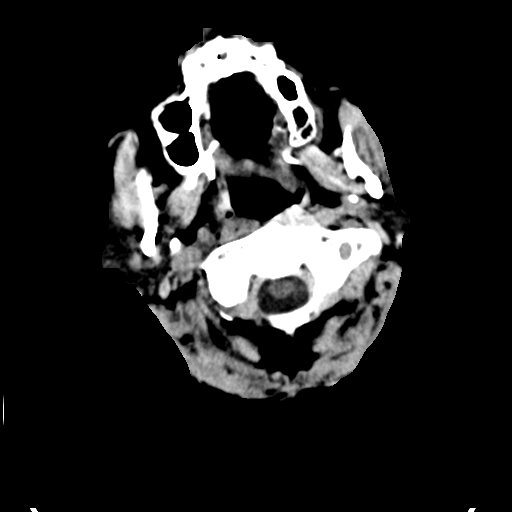
[im 3/36  bone]
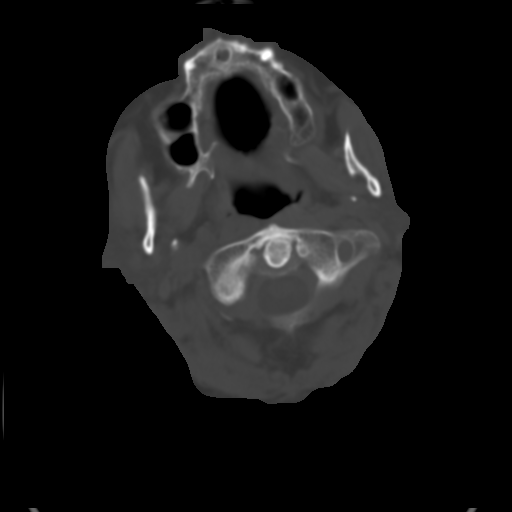
[im 6/36  brain]
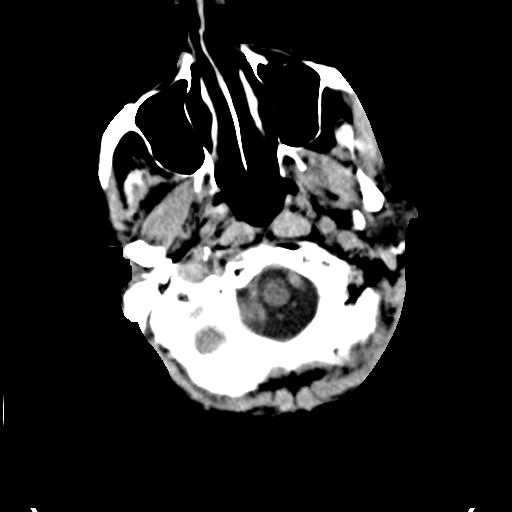
[im 8/36  brain]
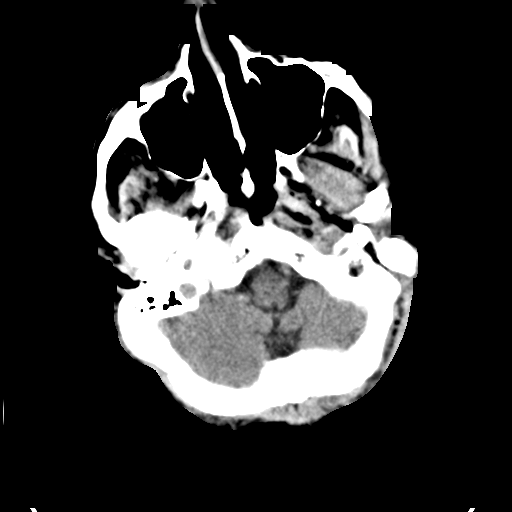
[im 13/36  brain]
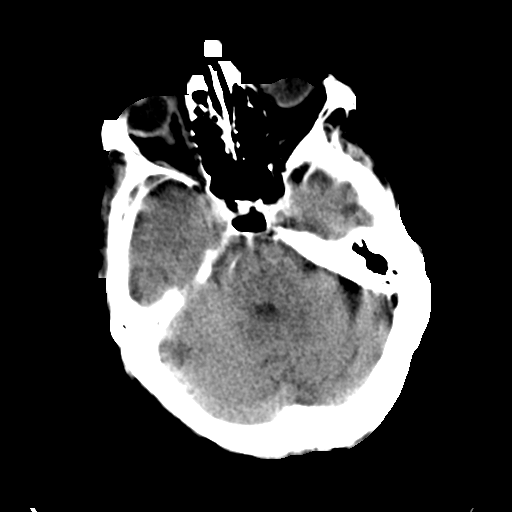
[im 16/36  brain]
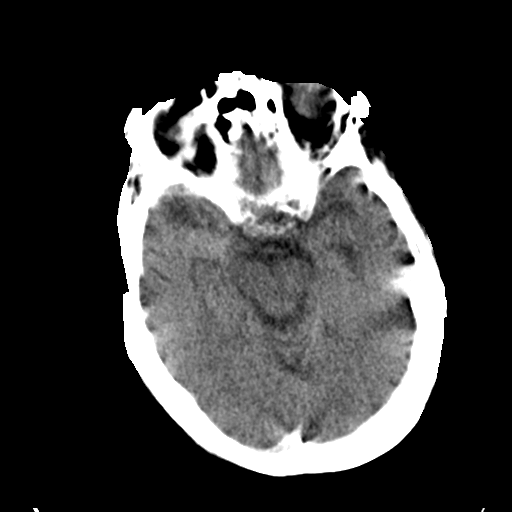
[im 16/36  bone]
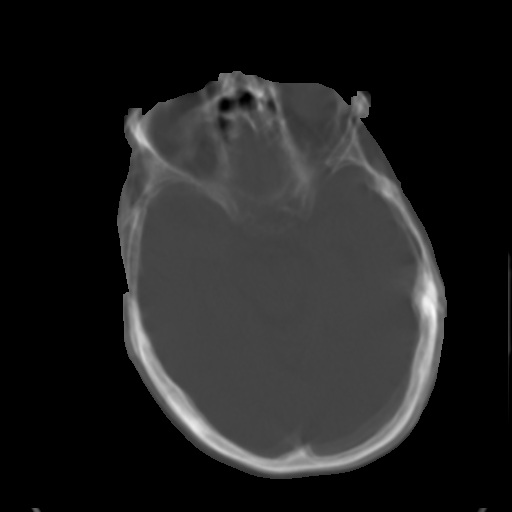
[im 18/36  brain]
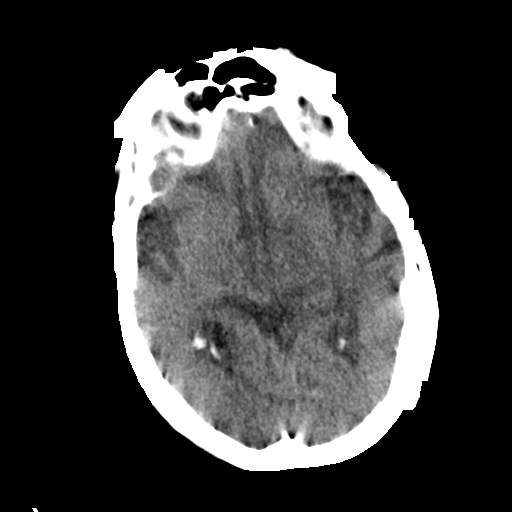
[im 21/36  brain]
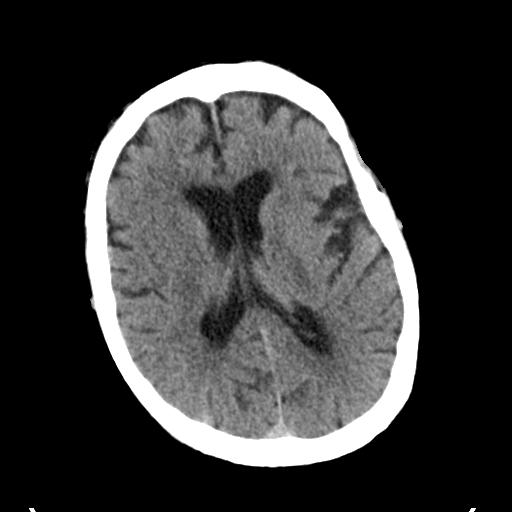
[im 23/36  brain]
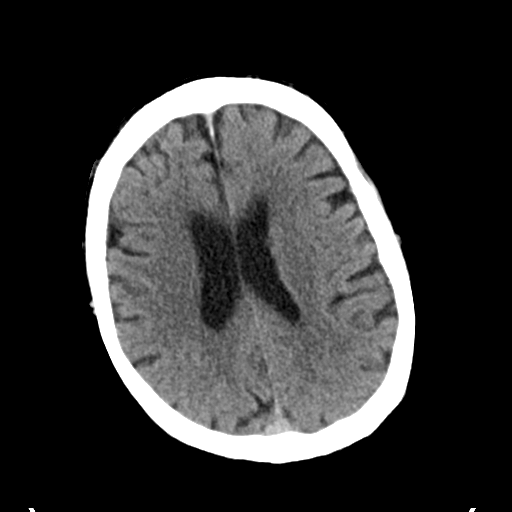
[im 28/36  brain]
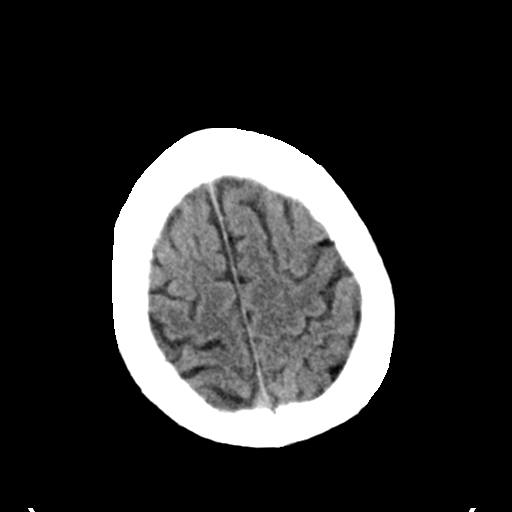
[im 28/36  bone]
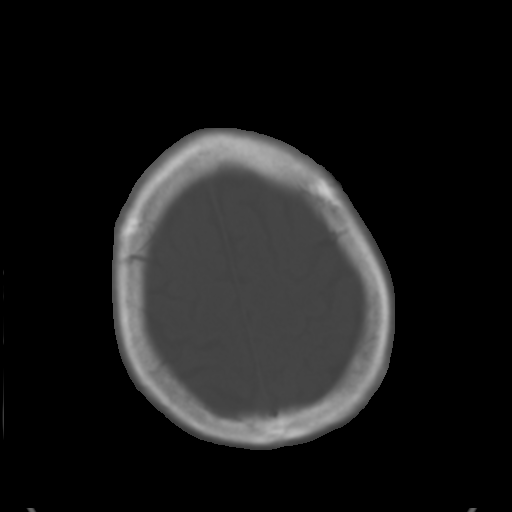
[im 31/36  brain]
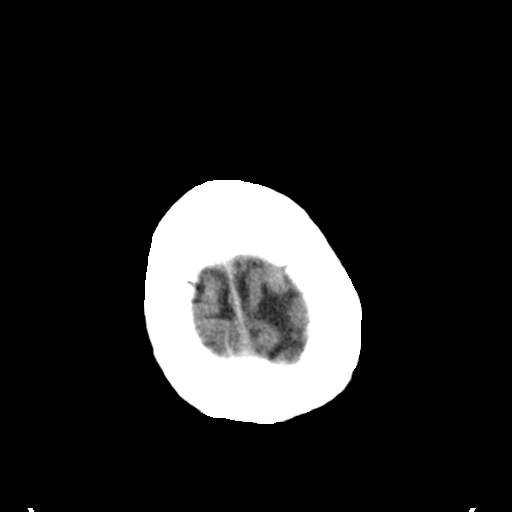
[im 33/36  brain]
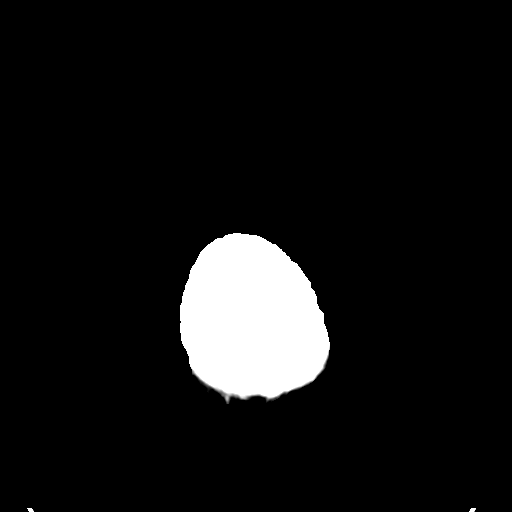

[Series 4: head 3.0 mpr cor · coronal · 0.35mm/px · 3 of 73 slices shown]
[im 19/73  brain]
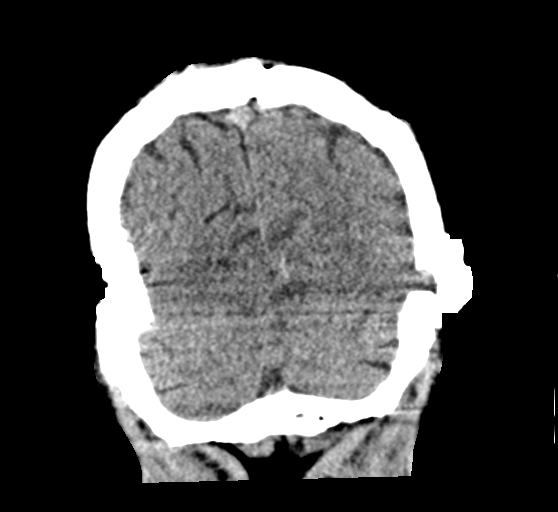
[im 37/73  brain]
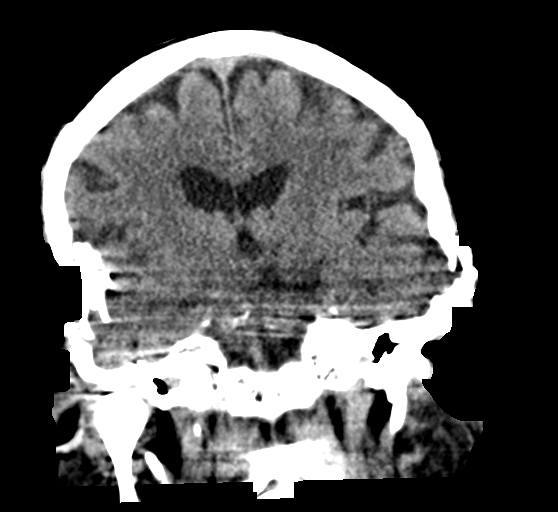
[im 55/73  brain]
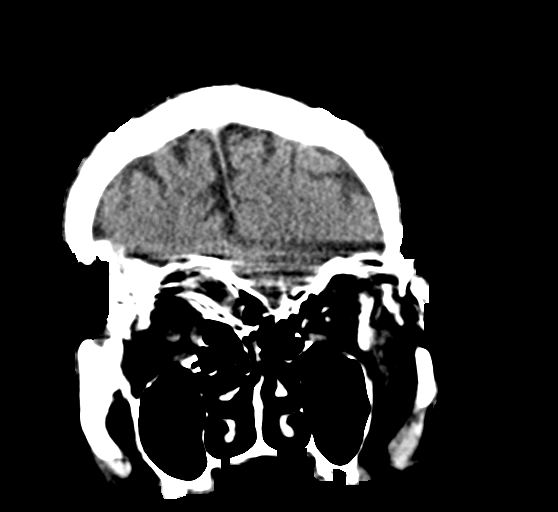

[Series 9: head 3.0 mpr sag repeat · sagittal · 0.35mm/px · 2 of 67 slices shown]
[im 23/67  brain]
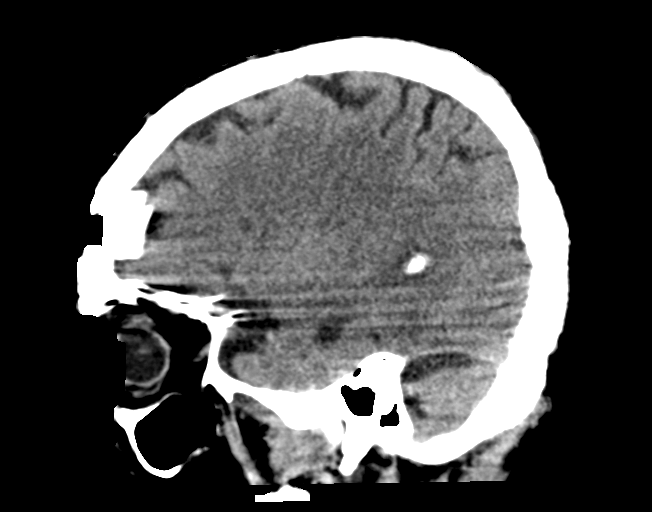
[im 45/67  brain]
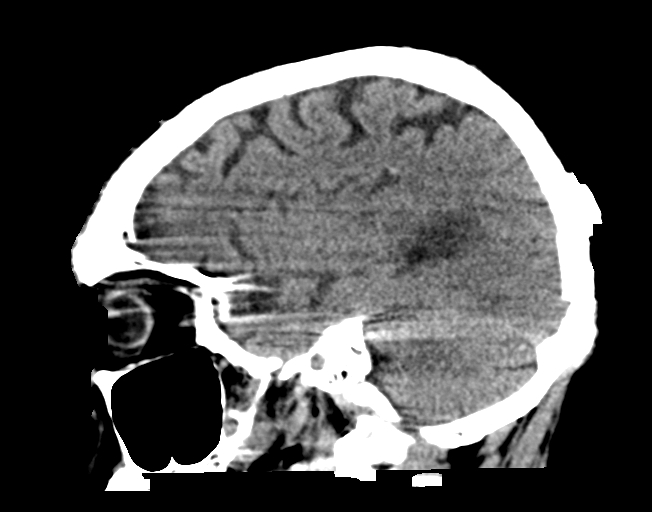

[16 of 47 positions shown; findings below may reference images not displayed]

FINDINGS: Patient had difficulty tolerating the exam. Despite repeat
acquisition, there is moderate motion artifact.

Brain: No evidence of intracranial hemorrhage, evaluation limited by
motion artifact. No hydrocephalus or midline shift. No mass effect.
No evidence of territorial ischemia motion limits more detailed
assessment. Suggestion of chronic small vessel disease, however
details obscured.

Vascular: No gross hyperdense vessel.

Skull: Significant motion limitations. No evidence of fracture or
focal lesion.

Sinuses/Orbits: No acute findings allowing for motion.

Other: None.
IMPRESSION: Significant motion limitations. No evidence of acute abnormality.
Consider repeat exam based on clinical concern when patient is able
to hold still.

## 2019-07-15 MED ORDER — CLONIDINE HCL 0.1 MG/24HR TD PTWK
0.1000 mg | MEDICATED_PATCH | TRANSDERMAL | Status: DC
  Administered 2019-07-15 – 2019-07-22 (×2): 0.1 mg via TRANSDERMAL
  Filled 2019-07-15 (×4): qty 1

## 2019-07-15 MED ORDER — LORAZEPAM 2 MG/ML IJ SOLN
1.0000 mg | INTRAMUSCULAR | Status: DC | PRN
  Administered 2019-07-16 – 2019-07-18 (×3): 2 mg via INTRAVENOUS
  Filled 2019-07-15 (×5): qty 1

## 2019-07-15 MED ORDER — POTASSIUM CHLORIDE 10 MEQ/100ML IV SOLN
10.0000 meq | INTRAVENOUS | Status: AC
  Administered 2019-07-15 (×4): 10 meq via INTRAVENOUS
  Filled 2019-07-15 (×3): qty 100

## 2019-07-15 MED ORDER — POTASSIUM CHLORIDE CRYS ER 20 MEQ PO TBCR: 40.0000 meq | EXTENDED_RELEASE_TABLET | Freq: Once | ORAL | Status: DC

## 2019-07-15 MED ORDER — METOPROLOL TARTRATE 5 MG/5ML IV SOLN
10.0000 mg | Freq: Four times a day (QID) | INTRAVENOUS | Status: DC
  Administered 2019-07-15: 5 mg via INTRAVENOUS
  Administered 2019-07-16 – 2019-07-21 (×17): 10 mg via INTRAVENOUS
  Filled 2019-07-15 (×19): qty 10

## 2019-07-15 MED ORDER — MORPHINE SULFATE (PF) 2 MG/ML IV SOLN
2.0000 mg | INTRAVENOUS | Status: DC | PRN
  Filled 2019-07-15: qty 1

## 2019-07-15 MED ORDER — MORPHINE SULFATE (PF) 2 MG/ML IV SOLN: 1.0000 mg | INTRAVENOUS | Status: DC | PRN

## 2019-07-15 MED ORDER — HALOPERIDOL LACTATE 5 MG/ML IJ SOLN: 5.0000 mg | Freq: Four times a day (QID) | INTRAMUSCULAR | Status: DC | PRN

## 2019-07-15 MED ORDER — FENTANYL 50 MCG/HR TD PT72
1.0000 | MEDICATED_PATCH | TRANSDERMAL | Status: DC
  Administered 2019-07-15: 1 via TRANSDERMAL
  Filled 2019-07-15: qty 1

## 2019-07-15 MED ORDER — CHLORHEXIDINE GLUCONATE CLOTH 2 % EX PADS
6.0000 | MEDICATED_PAD | Freq: Every day | CUTANEOUS | Status: DC
  Administered 2019-07-15 – 2019-08-02 (×16): 6 via TOPICAL

## 2019-07-15 MED ORDER — POTASSIUM CHLORIDE 10 MEQ/100ML IV SOLN
10.0000 meq | INTRAVENOUS | Status: AC
  Filled 2019-07-15: qty 100

## 2019-07-15 MED ORDER — PANTOPRAZOLE SODIUM 40 MG IV SOLR
40.0000 mg | INTRAVENOUS | Status: DC
  Administered 2019-07-15 – 2019-07-23 (×9): 40 mg via INTRAVENOUS
  Filled 2019-07-15 (×9): qty 40

## 2019-07-15 NOTE — Progress Notes (Signed)
On admission, patient was very agitated.  Patient pulling on foley and IV lines.  Mitts placed on patient.  Continued agitation and aggressiveness required administration of prn IV ativan.  Patient somewhat calmer a short time later.

## 2019-07-15 NOTE — Progress Notes (Addendum)
Report received from [place], [place] in ER.  Per report patient is disoriented and nonverbal.  Patient was in restraints until an hour before transfer to 4E.  Patient received from ER.  Patient disoriented and nonverbal.  Patient resistant to all treatment.  Patient's eyes remain closed.  Patient placed on telemetry.  2 person skin assessment completed with Collet[place].

## 2019-07-15 NOTE — Progress Notes (Addendum)
Progress Note  Patient Name: Riley Eaton Date of Encounter: 07/15/2019  Primary Cardiologist: Quay Burow, MD   Subjective   Patient is still confused and agitated. He is mumbling during exam.   Inpatient Medications    Scheduled Meds: . Chlorhexidine Gluconate Cloth  6 each Topical Daily  . cloNIDine  0.1 mg Oral TID  . DULoxetine  60 mg Oral BID  . enoxaparin (LOVENOX) injection  40 mg Subcutaneous Q24H  . fentaNYL (SUBLIMAZE) injection  25 mcg Intravenous Q4H  . metoprolol tartrate  5 mg Intravenous Q6H  . naloxegol oxalate  25 mg Oral Daily  . oxyCODONE  40 mg Oral TID  . pantoprazole  40 mg Oral Daily  . pregabalin  200 mg Oral TID  . tamsulosin  0.4 mg Oral Daily  . traZODone  50 mg Oral QHS   Continuous Infusions: . cefTRIAXone (ROCEPHIN)  IV 200 mL/hr at 07/14/19 2100  . metronidazole 500 mg (07/15/19 0407)   PRN Meds: allopurinol, haloperidol lactate, hydrALAZINE, ketorolac, LORazepam   Vital Signs    Vitals:   07/15/19 0012 07/15/19 0200 07/15/19 0344 07/15/19 0853  BP: (!) 167/94  (!) 180/116 (!) 133/107  Pulse: 98 91 97 91  Resp: (!) 21 (!) 25 20 18   Temp: 97.6 F (36.4 C)  98.5 F (36.9 C) (!) 97.5 F (36.4 C)  TempSrc: Axillary  Axillary Axillary  SpO2: 99% 99% 98% 99%  Weight:        Intake/Output Summary (Last 24 hours) at 07/15/2019 0915 Last data filed at 07/15/2019 0421 Gross per 24 hour  Intake 700 ml  Output 1300 ml  Net -600 ml   Last 3 Weights 07/14/2019 02/01/2019 09/25/2018  Weight (lbs) 154 lb 1.6 oz 162 lb 158 lb  Weight (kg) 69.9 kg 73.483 kg 71.668 kg      Telemetry    NSR and Sinus tachycardia; HR 90-110;  - Personally Reviewed  ECG    NSR, 91 bpm, minimal STE anterolateral leads with TWI- Personally Reviewed  Physical Exam   GEN: No acute distress; awake, agitated, eyes closed.   Neck: No JVD Cardiac: RRR, no murmurs, rubs, or gallops.  Respiratory: Clear to auscultation bilaterally. GI: Soft, nontender,  non-distended  MS: No edema; No deformity. Neuro:  moves all extremities Psych: unable to assess  Labs    High Sensitivity Troponin:   Recent Labs  Lab 07/13/19 1440 07/14/19 0757 07/14/19 1158 07/14/19 1409  TROPONINIHS 17 684* 1,004* 974*      Chemistry Recent Labs  Lab 07/13/19 1440 07/13/19 1530 07/14/19 0757 07/15/19 0319  NA 142 143 144 143  K 3.2* 3.2* 3.2* 3.0*  CL 108 111 110 110  CO2 18*  --  22 19*  GLUCOSE 168* 158* 144* 127*  BUN 28* 31* 23 25*  CREATININE 1.01 0.70 0.74 0.69  CALCIUM 10.0  --  9.3 9.3  PROT 7.6  --  7.1  7.0 6.9  ALBUMIN 4.5  --  4.2  4.2 4.2  AST 42*  --  54*  55* 74*  ALT 21  --  29  28 39  ALKPHOS 93  --  91  87 92  BILITOT 2.1*  --  1.7*  1.5* 1.9*  GFRNONAA >60  --  >60 >60  GFRAA >60  --  >60 >60  ANIONGAP 16*  --  12 14     Hematology Recent Labs  Lab 07/13/19 1440 07/13/19 1530 07/14/19 0757 07/15/19 0319  WBC 7.0  --  8.1 12.6*  RBC 4.07*  --  3.92* 4.27  HGB 13.7 12.2* 13.2 14.5  HCT 38.2* 36.0* 36.9* 39.2  MCV 93.9  --  94.1 91.8  MCH 33.7  --  33.7 34.0  MCHC 35.9  --  35.8 37.0*  RDW 13.4  --  13.5 13.3  PLT 140*  --  105* 138*    BNPNo results for input(s): BNP, PROBNP in the last 168 hours.   DDimer No results for input(s): DDIMER in the last 168 hours.   Radiology    Dg Abd Portable 1 View  Result Date: 07/13/2019 CLINICAL DATA:  Abdominal pain. EXAM: PORTABLE ABDOMEN - 1 VIEW COMPARISON:  09/28/2018 FINDINGS: Bowel gas pattern is normal. Severe degenerative changes in the lumbar spine at L3-4. Contrast in the bladder from the CT scan performed yesterday. IMPRESSION: Benign-appearing abdomen. Electronically Signed   By: Lorriane Shire M.D.   On: 07/13/2019 17:41   US Abdomen Limited Ruq  Result Date: 07/13/2019 CLINICAL DATA:  78 year old male with history of abdominal pain. EXAM: ULTRASOUND ABDOMEN LIMITED RIGHT UPPER QUADRANT COMPARISON:  Right upper quadrant abdominal ultrasound  09/25/2018. FINDINGS: Gallbladder: Amorphous nonshadowing echogenic material in the gallbladder, compatible with biliary sludge. Additional 9 mm echogenic focus with no posterior acoustic shadowing lying dependently, compatible with a sludge ball. Gallbladder is moderately distended. Gallbladder wall thickness is normal at 2.7 mm. No pericholecystic fluid. Per report from the sonographer there was no sonographic Murphy's sign on examination. Common bile duct: Diameter: 3.7 mm Liver: No focal lesion identified. Within normal limits in parenchymal echogenicity. Portal vein is patent on color Doppler imaging with normal direction of blood flow towards the liver. Other: None. IMPRESSION: 1. Biliary sludge and biliary sludge ball in the gallbladder. No findings to suggest an acute cholecystitis at this time. Electronically Signed   By: Vinnie Langton M.D.   On: 07/13/2019 19:55    Cardiac Studies   Myoview 10/20/2015 Study Highlights   The left ventricular ejection fraction is normal (55-65%).  Nuclear stress EF: 57%.  There was no ST segment deviation noted during stress.  The study is normal.  This is a low risk study.  Normal pharmacologic nuclear stress test with no prior infarct and no ischemia   Echo 07/14/19  1. Technically difficult study. Left ventricular ejection fraction, by visual estimation, is 30 to 35%. The left ventricle has grossly severely decreased function. Images are not sufficient to assess for regional wall motion abnormalities. There is  mildly increased left ventricular hypertrophy.  2. Left ventricular diastolic parameters are consistent with Grade I diastolic dysfunction (impaired relaxation).  3. Global right ventricle has normal systolic function.The right ventricular size is normal.  4. The mitral valve is normal in structure. No evidence of mitral valve regurgitation.  5. The tricuspid valve is not well visualized. Tricuspid valve regurgitation is not  demonstrated.  6. The aortic valve was not well visualized. Aortic valve regurgitation is not visualized.  7. The pulmonic valve was not well visualized. Pulmonic valve regurgitation is not visualized.   Patient Profile     78 y.o. male with a hx of chronic pain, opoid use, prior pancreatic stent placement, thoracic aortic aneurysm, coronary artery calcification seen on chest CT who is being seen today for the evaluation of elevated troponin in the setting of AMS and chololithiasis.  Assessment & Plan    Abdominal Pain H/o of gallstones with ERCP and stenting earlier this year, was unable  to follow up this summer due to covid. Second ER visit this week for abdominal pain. CT abd and RUQ [place] negative for acute cholecystitis, but showed gallstones. LFTs minimally elevated. WBC normal. Lactate initially elevated. Procalcitonin normal.  - abx started per CCM - LFTS up today. WBC up to 12.6 - Blood cultures no growth so far - ?General surgery consulted  AMS/Agitation Patient was brought for confusion and agitation via EMS. He was given dilaudid, haldol, ativan, fentanyl. H/o of chronic pain with opoid use. - AMS possibly from narcotic withdrawal vs the above problem - management per IM/CCM  Elevated troponin Patient with h/o of thoracic aortic aneurysm and coronary calcifications seen on CT. Myoview in 2017 was normal. EF normal. - On admission HS troponin peaked at 1,004, now trending down. Could be due to demand ischemia.  - Patient with no h/o of chest pain according to chart review - Echo with  reduced EF of 30-35% - With reduced EF ischemic eval is warranted, but do not think cardiac procedure would be performed given his current mental state and possible sepsis picture. Will discuss plan with MD  Chronic narcotic use - was on oxycotin 40 mg TID, vicodin 5 q6hours - management per IM   HTN - not on meds at baseline - Pressures elevated on admission, systolics up to [id number] -  started on clonidine and IV metoprolol 5 mg q6 hours   For questions or updates, please contact [facility] Please consult [email or web address] for contact info under        Signed, Cadence Ninfa Meeker, PA-C  07/15/2019, 9:15 AM    Patient seen and examined with APP.  Agree as above, with the following exceptions and changes as noted below.  Patient remains confused and agitated today.  Troponin elevated, however unable to perform intervention with patient's current mental status.  In addition I am concerned about impending sepsis with a GI etiology. Gen: agitated, CV: RRR, no murmurs, Lungs: clear, Abd: soft, Extrem: Warm, well perfused, no edema, Neuro/Psych:unable to assess. All available labs, radiology testing, previous records reviewed. Will follow along for resolution of altered mental status. IV metoprolol started for HTN, caution with antihypertensives with concern for sepsis. We have been unable to clear him of intracranial process yet, concern starting anticoagulation for ACS.  Elouise Munroe

## 2019-07-15 NOTE — Plan of Care (Signed)
  Problem: Education: Goal: Knowledge of General Education information will improve Description: Including pain rating scale, medication(s)/side effects and non-pharmacologic comfort measures Outcome: Progressing   Problem: Health Behavior/Discharge Planning: Goal: Ability to manage health-related needs will improve Outcome: Progressing   Problem: Clinical Measurements: Goal: Ability to maintain clinical measurements within normal limits will improve Outcome: Progressing Goal: Will remain free from infection Outcome: Progressing Goal: Diagnostic test results will improve Outcome: Progressing Goal: Respiratory complications will improve Outcome: Progressing Goal: Cardiovascular complication will be avoided Outcome: Progressing   Problem: Activity: Goal: Risk for activity intolerance will decrease Outcome: Progressing   Problem: Nutrition: Goal: Adequate nutrition will be maintained Outcome: Progressing   Problem: Coping: Goal: Level of anxiety will decrease Outcome: Progressing   Problem: Elimination: Goal: Will not experience complications related to bowel motility Outcome: Progressing Goal: Will not experience complications related to urinary retention Outcome: Progressing   Problem: Pain Managment: Goal: General experience of comfort will improve Outcome: Progressing   Problem: Safety: Goal: Ability to remain free from injury will improve Outcome: Progressing   Problem: Skin Integrity: Goal: Risk for impaired skin integrity will decrease Outcome: Progressing   Problem: Activity: Goal: Will verbalize the importance of balancing activity with adequate rest periods Outcome: Progressing   Problem: Education: Goal: Will be free of psychotic symptoms Outcome: Progressing Goal: Knowledge of the prescribed therapeutic regimen will improve Outcome: Progressing   Problem: Coping: Goal: Coping ability will improve Outcome: Progressing Goal: Will verbalize  feelings Outcome: Progressing   Problem: Health Behavior/Discharge Planning: Goal: Compliance with prescribed medication regimen will improve Outcome: Progressing   Problem: Nutritional: Goal: Ability to achieve adequate nutritional intake will improve Outcome: Progressing   Problem: Role Relationship: Goal: Ability to communicate needs accurately will improve Outcome: Progressing Goal: Ability to interact with others will improve Outcome: Progressing   Problem: Safety: Goal: Ability to redirect hostility and anger into socially appropriate behaviors will improve Outcome: Progressing Goal: Ability to remain free from injury will improve Outcome: Progressing   Problem: Self-Care: Goal: Ability to participate in self-care as condition permits will improve Outcome: Progressing   Problem: Self-Concept: Goal: Will verbalize positive feelings about self Outcome: Progressing

## 2019-07-15 NOTE — Plan of Care (Signed)
  Problem: Education: Goal: Knowledge of General Education information will improve Description: Including pain rating scale, medication(s)/side effects and non-pharmacologic comfort measures Outcome: Not Progressing   Problem: Health Behavior/Discharge Planning: Goal: Ability to manage health-related needs will improve Outcome: Not Progressing   

## 2019-07-15 NOTE — Progress Notes (Signed)
Patient in constant motion, appears uncomfortable and in mental distress. Difficult to assess given non verbal and non interactive or eye opening.  Gave prn meds as indicated in attempt to keep patient as comfortable and safe as possible.  Family, MD updated on status, plan of care throughout the day.  Need to evaluate need for indwelling foley cath, possible transition to condom cath.  Nite RN to f/u.

## 2019-07-15 NOTE — Progress Notes (Signed)
PROGRESS NOTE  Riley Eaton [id number] DOB: August 06, 1941 DOA: 07/13/2019 PCP: Aletha Halim., PA-C  Brief History   The patient is a 78 yr old man who presented to [facility] ED on 07/13/2019 with complaints of right upper quadrant pain. He had been seen at [facility] on 07/12/2019 for the same complaints. CT of the abdomen and pelvis on 07/12/2019 demonstrated cholelithiasis, but no evidence of cholecystitis. Surgery was consulted, and they felt that this could be managed as outpatient. The patient was sent home.   He returned to [facility] ED on 06/13/2019 for increase in abdominal pain, nausea and vomiting. They patient also was very agitated upon returning. Per his wife he is acting as he did last summer when he had cholelithiasis requiring ERCP and stone retrieval. The patient apparently characterized the pain as severe at that time. It was right upper quadrant in location. No palliative or provocative factors. Right upper quadrant ultrasound was performed in the ED on 07/13/2019. It demonstrated bilary sludge and a biliary sludge ball in the gallbladder. No findings to suggest an acute cholecystitis at this time.   [facility] were consulted to admit the patient for further evaluation and treatment. Overnight the patient has become more delirious. He is very agitated this morning and cannot follow commands.   Initial troponin was 17 on presentation to [facility] ED. It has increased to 684. The patient has received an aspirin and metoprolol. Cardiology has been consulted and has ordered an echocardiogram.  LFT's remain elevated. Will consider HIDA Scan, GI/General surgery consult pending result of cardiac work up and improvement in the patient's mental status.   Palliative care has been consulted.  Consultants  . Cardiology . Palliative care  Procedures  . None  Antibiotics   Anti-infectives (From admission, onward)   Start     Dose/Rate Route Frequency Ordered Stop   07/13/19 1900   cefTRIAXone (ROCEPHIN) 1 g in sodium chloride 0.9 % 100 mL IVPB     1 g 200 mL/hr over 30 Minutes Intravenous Every 24 hours 07/13/19 1825     07/13/19 1900  metroNIDAZOLE (FLAGYL) IVPB 500 mg     500 mg 100 mL/hr over 60 Minutes Intravenous Every 8 hours 07/13/19 1825       Subjective  The patient remains encephalopathic. He is not verbal and stirs to verbal and noxious stimuli, but does not follow commands. He is displaying some agonal respirations.   Objective   Vitals:  Vitals:   07/15/19 1220 07/15/19 1653  BP: (!) 154/96 (!) 146/101  Pulse: (!) 135 87  Resp: (!) 26 18  Temp:  98 F (36.7 C)  SpO2: 95% 100%   Exam:  Constitutional:  . The patient is agitated, but lying with his eyes closed. He is moving about in bed. He does not follow commands or answer questions. He appears severely ill. Agonal respirations. Eyes:  [name] Eyes squeezes tightly shut when I try to open them. He does not do so when asked.  Respiratory:  . Agonal respirations. . No wheezes, rales, or rhonchi . Lung sounds are diminished bilaterally . No tactile fremitus Cardiovascular:  . Regular rate and rhythm . No murmurs, ectopy, or gallups. . No lateral PMI. No thrills. Abdomen:  . Abdomen is soft, non-tender, non-distended . Abdomen is scaffoid. . No hernias, masses, or organomegaly . Hypoactive bowel sounds.  Musculoskeletal:  . No cyanosis, clubbing, or edema . Cachectic Skin:  . No rashes, lesions, ulcers . palpation of skin: no  induration or nodules Neurologic:  . Moving all extremities . Unable to evaluate as the patient is unable to cooperate with exam. Psychiatric:  . Unable to evaluate as the patient is unable to cooperate with exam.  I have personally reviewed the following:   Today's Data  . Vitals, CMP, Troponins, CBC  Cardiology Data  . EKG (07/13/2019, 07/14/2019)  Other Data  . CT Abdomen, Right upper quadrant ultrasound.  Scheduled Meds: . Chlorhexidine Gluconate  Cloth  6 each Topical Daily  . cloNIDine  0.1 mg Oral TID  . DULoxetine  60 mg Oral BID  . enoxaparin (LOVENOX) injection  40 mg Subcutaneous Q24H  . fentaNYL (SUBLIMAZE) injection  25 mcg Intravenous Q4H  . metoprolol tartrate  10 mg Intravenous Q6H  . naloxegol oxalate  25 mg Oral Daily  . oxyCODONE  40 mg Oral TID  . pantoprazole  40 mg Oral Daily  . potassium chloride  40 mEq Oral Once  . pregabalin  200 mg Oral TID  . tamsulosin  0.4 mg Oral Daily  . traZODone  50 mg Oral QHS   Continuous Infusions: . cefTRIAXone (ROCEPHIN)  IV 200 mL/hr at 07/14/19 2100  . metronidazole 500 mg (07/15/19 1649)  . potassium chloride      Active Problems:   Chronic low back pain   Choledocholithiasis   Acute metabolic encephalopathy   Mental status alteration   ACS (acute coronary syndrome) ([place])   LOS: 2 days   A & P   NSTEMI: Troponins have elevated from 17 to 684 to 1000. EKG earlier today demonstrated septal st segment depressions.Cardiology has been consulted and is checking an echocardiogram. The patient has been given an aspirin and IV lopressor. This could possibly be the cause of the patient's "right upper quadrant" abdominal pain. Continue to monitor on telemetry. I have discussed the patient with Dr. Margaretann Loveless. The patient will have an echocardiogram and will be treated conservatively for now. Pt will need a step down bed.   Cholangitis/Cholecystitis ([place]?): Pt does not appear septic. No fever, no elevation of WBC, normal lactic acid, hypertention with a mild elevation of heart rate. Procalcitonin is negative. Rt upper quadrant ultrasound demonstrates no gallbladder wall thickening or pericholecystic edema. It does demonstrate sludge and a "sludge ball". No CBD dilatation is noted. LFT's are mildly elevated. The patient will need MRCP and HIDA scan, but this will have to wait until cardiac issues are sorted out.   Delirium: Patient is agitated and confused. He is requiring  restraints. He is not verbally communicative with me and will not follow commands. I do not believe that he is able to cooperate with any procedure or study at this time. Delirium is likely due to acute illness. His wife has asked if he could have visitors. I have told her that I believe that it may be helpful to him to have a family member at bedside. His wife stated that their daughter may come. CT head was performed. There were serious motion artifacts with reduce the value of the images, but no acute pathology was seen by radiology. I have consulted palliative care.  Hypertension: IV metoprolol. Monitor blood pressure and heart rate. Will increase dose of metoprolol.  History of thoracic aortic aneurysm: Noted.   I have seen and examined this patient myself. I have spent 48 minutes in his evaluation and care. I have spent more than 50% of this in coordination of care with Dr. Margaretann Loveless and in consultation with the patient's  daughter who was at bedside and in a telephone conversation with his wife. I have answered all questions as possible.  CODE STATUS: Full DVT Prophylaxis: Heparin gtt as per cardiology Family Communication: I have spoken to the patient's wife, [name], and his daughter, Sharyn Lull, at length regarding the patient. All questions answered to the best of my ability. Disposition: tbd  Darice Vicario, DO [facility] Direct contact: see [email or web address]  7PM-7AM contact night coverage as above 07/15/2019, 5:49 PM  LOS: 1 day

## 2019-07-15 NOTE — Consult Note (Signed)
Consultation Note Date: 07/15/2019   Patient Name: Riley Eaton  DOB: [birth year]  MRN: [id number]  Age / Sex: 78 y.o., male  PCP: Aletha Halim., PA-C Referring Physician: Karie Kirks, DO  Reason for Consultation: Establishing goals of care and symptom management  HPI/Patient Profile: 78 y.o. male  with past medical history of chronic low back pain, chronic opioid use, thoracic aortic aneurysm, cholelithiasis admitted on 07/13/2019 with worsening abdominal pain and elevated troponin.  CT on 07/12/2019 showed cholelithiasis but no evidence of cholecystitis.  He was discharged home and returned to the ED with worsening pain and new increased agitation.  He is lying in bed and does not interact with verbal or tactile stimulation.  Palliative care consulted for goals of care and symptom management.  Clinical Assessment and Goals of Care: Palliative care consult received.  Chart reviewed including personal review of pertinent labs and imaging.  I saw and examined Riley Eaton today.  He is lying in bed with eyes closed and does not interact with examiner.  Despite relatively stable vital signs, he appears acutely ill with frequent periods of apnea noted.  I called and spoke with his daughter and wife via phone.  Daughter was primary spokesperson for family during phone call but his wife was present with her as well.  I introduced palliative care as specialized medical care for people living with serious illness. It focuses on providing relief from the symptoms and stress of a serious illness. The goal is to improve quality of life for both the patient and the family.  We discussed clinical course as well as wishes moving forward in regard to  care this hospitalization.  I attempted to impress on family how concerned I am with seeing Riley Eaton this evening.  While he does have relatively stable vital signs and  labs, his physical exam is concerning with periods of apnea/agonal breathing noted.   His daughter relates that this is similar to presentation that he had back in February when he was admitted with ascending cholangitis with CBD stone removal by ERCP.  During that admission, he was transferred to the ICU and required Precedex infusion.  She remains hopeful that his condition will continue to improve with better management of his pain as she feels that restarting his pain medication during the admission in February is what led to him regaining his mental status.  I discussed with his family regarding his chronic opioid use at home.  Currently, he takes OxyContin 40 mg 3 times per day as well as as needed hydrocodone 5/325.  They request him to be restarted on pain medication as this seemed to drive his delirium in the past and they do not want him in uncontrolled pain.  Concepts specific to code status discussed.    Family is clear that at this point in time they believe he would want any and all aggressive interventions, including mechanical ventilation or CPR in the event of respiratory or cardiac arrest.  Questions and concerns addressed.  PMT will continue to support holistically.  SUMMARY OF RECOMMENDATIONS   -Full code/full scope treatment.  Expressed to family how concerned I am with seeing Riley Eaton this evening.  It remains unclear to me the etiology of his presentation, but I am very concerned seeing him this evening as he appears to me to be somebody who is at high risk of further decompensation and death.  He is having periods of apnea on examination. -Pain: At home Riley Eaton takes oxycodone ER 40 mg 3 times per day.  This is the equivalent of 180 mg of oral morphine.  While he is currently ordered his home dose of oxycodone, he has not been receiving it due to the fact that he remains n.p.o. from his mental status.  While I do not think that this is the sole cause of his encephalopathy,  uncontrolled pain and withdrawal could be contributing to his delirium.  Based upon his home usage of Oxycontin, his recommended fentanyl patch dose would be 75 mcg/h.  We will plan to dose reduce this by 33% and start fentanyl 50 mcg patch per hour to get some replacement of his home long-acting regimen with hopes that this may improve his mental status and component of withdrawal that may be occurring.  Additionally, we will plan for morphine 2 mg every hours as needed for breakthrough pain.  This is a lower dose they would be recommended for his home equivalent as well, but we will continue to be judicious with use of opioids particularly in light of the fact that he is also been receiving large doses of Haldol and Ativan. -Delirium: Continue to work to establish etiology including what ever is driving his primary illness and other factors such as uncontrolled pain and opioid withdrawal.  Restart pain management as noted above.  We will also decrease currently ordered lorazepam and Haldol as we are adding back in basal opioid and want to be careful with concurrent use of benzos and opioids.  He does appear to have a high tolerance based upon prior medication usage.  Code Status/Advance Care Planning:  Full code   Symptom Management:   As above  Palliative Prophylaxis:   Aspiration and Delirium Protocol  Additional Recommendations (Limitations, Scope, Preferences):  Full Scope Treatment  Psycho-social/Spiritual:   Desire for further Chaplaincy support:no  Prognosis:   Guarded  Discharge Planning: To Be Determined      Primary Diagnoses: Present on Admission:  Chronic low back pain  Choledocholithiasis  Acute metabolic encephalopathy  Mental status alteration  ACS (acute coronary syndrome) ([place])   I have reviewed the medical record, interviewed the patient and family, and examined the patient. The following aspects are pertinent.  Past Medical History:  Diagnosis  Date   Chronic back pain    Chronic low back pain 05/19/2017   Chronic, continuous use of opioids    for back pain   Coronary artery calcification seen on CT scan    Nerve pain    Thoracic aortic aneurysm [facility])    Social History   Socioeconomic History   Marital status: Married    Spouse name: Not on file   Number of children: 3   Years of education: 12   Highest education level: Not on file  Occupational History   Not on file  Social Needs   Financial resource strain: Not on file   Food insecurity    Worry: Not on file    Inability: Not on file   Transportation  needs    Medical: Not on file    Non-medical: Not on file  Tobacco Use   Smoking status: Never Smoker   Smokeless tobacco: Never Used  Substance and Sexual Activity   Alcohol use: No   Drug use: No   Sexual activity: Not on file  Lifestyle   Physical activity    Days per week: Not on file    Minutes per session: Not on file   Stress: Not on file  Relationships   Social connections    Talks on phone: Not on file    Gets together: Not on file    Attends religious service: Not on file    Active member of club or organization: Not on file    Attends meetings of clubs or organizations: Not on file    Relationship status: Not on file  Other Topics Concern   Not on file  Social History Narrative   Lives with wife   Caffeine use: Drinks 6 cups coffee per day   Right handed    Family History  Problem Relation Age of Onset   Cancer Mother    Heart attack Father    Scheduled Meds:  Chlorhexidine Gluconate Cloth  6 each Topical Daily   cloNIDine  0.1 mg Transdermal Weekly   DULoxetine  60 mg Oral BID   enoxaparin (LOVENOX) injection  40 mg Subcutaneous Q24H   fentaNYL  1 patch Transdermal Q72H   metoprolol tartrate  10 mg Intravenous Q6H   naloxegol oxalate  25 mg Oral Daily   pantoprazole  40 mg Oral Daily   potassium chloride  40 mEq Oral Once   pregabalin  200 mg  Oral TID   tamsulosin  0.4 mg Oral Daily   traZODone  50 mg Oral QHS   Continuous Infusions:  cefTRIAXone (ROCEPHIN)  IV 200 mL/hr at 07/14/19 2100   metronidazole 500 mg (07/15/19 1649)   potassium chloride 10 mEq (07/15/19 1757)   PRN Meds:.haloperidol lactate, hydrALAZINE, ketorolac, LORazepam, morphine injection Medications Prior to Admission:  Prior to Admission medications   Medication Sig Start Date End Date Taking? Authorizing Provider  allopurinol (ZYLOPRIM) 100 MG tablet TAKE 1 TABLET BY MOUTH EVERY DAY Patient taking differently: Take 100 mg by mouth daily as needed (gout flare up).  03/16/19   Kathrynn Ducking, MD  bisacodyl (DULCOLAX) 10 MG suppository Place 1 suppository (10 mg total) rectally as needed for moderate constipation. 10/26/17   Varney Biles, MD  calcium-vitamin D (OSCAL WITH D) 500-200 MG-UNIT tablet Take 1 tablet by mouth daily with breakfast.    [provider]  Diphenhydramine-APAP, sleep, (TYLENOL PM EXTRA STRENGTH PO) Take 1 tablet by mouth at bedtime.     [provider]  DULoxetine (CYMBALTA) 60 MG capsule TAKE 1 CAPSULE BY MOUTH TWICE A DAY Patient taking differently: Take 60 mg by mouth 2 (two) times daily.  07/01/19   Kathrynn Ducking, MD  HYDROcodone-acetaminophen (NORCO/VICODIN) 5-325 MG tablet Take 1 tablet by mouth every 6 (six) hours as needed for moderate pain. 05/03/19   Kathrynn Ducking, MD  MOVANTIK 25 MG TABS tablet Take 25 mg by mouth daily. 06/14/19   [provider]  omeprazole (PRILOSEC) 20 MG capsule Take 20 mg by mouth daily. 05/17/19   [provider]  oxyCODONE (OXYCONTIN) 40 mg 12 hr tablet Take 1 tablet (40 mg total) by mouth 3 (three) times daily. 05/03/19   Kathrynn Ducking, MD  pregabalin (LYRICA) 200 MG capsule  Take 1 capsule (200 mg total) by mouth 3 (three) times daily. 04/13/19   Kathrynn Ducking, MD  promethazine (PHENERGAN) 25 MG tablet Take 25 mg by mouth every 8 (eight) hours as needed  for nausea/vomiting. 06/15/19   [provider]  tamsulosin (FLOMAX) 0.4 MG CAPS capsule Take 0.4 mg by mouth daily.  08/29/15   [provider]  traZODone (DESYREL) 50 MG tablet Take 50 mg by mouth at bedtime. 05/17/19   [provider]   No Known Allergies Review of Systems Unable to obtain  Physical Exam General: Agitated, eyes tightly closed and will not open them, he appears severely ill with apneic periods/agonal breaths noted. Heart: Regular rate and rhythm. No murmur appreciated. Lungs: Decreased air movement, scattered coarse Abdomen: Soft, nontender, nondistended, positive bowel sounds.  Ext: No significant edema Skin: Warm and dry Neuro: Unable to assess as will not follow commands.  Does move 4 extremities.  Vital Signs: BP (!) 146/101    Pulse 84    Temp 98 F (36.7 C) (Axillary)    Resp (!) 24    Wt 69.9 kg    SpO2 100%    BMI 24.14 kg/m  Pain Scale: CPOT POSS *See Group Information*: 1-Acceptable,Awake and alert Pain Score: 5    SpO2: SpO2: 100 % O2 Device:SpO2: 100 % O2 Flow Rate: .O2 Flow Rate (L/min): 2 L/min  IO: Intake/output summary:   Intake/Output Summary (Last 24 hours) at 07/15/2019 1822 Last data filed at 07/15/2019 1657 Gross per 24 hour  Intake 300 ml  Output 1050 ml  Net -750 ml    LBM:   Baseline Weight: Weight: 69.9 kg Most recent weight: Weight: 69.9 kg     Palliative Assessment/Data:     Time In: 1700 Time Out: 1820 Time Total: 80 Greater than 50%  of this time was spent counseling and coordinating care related to the above assessment and plan.  Signed by: Micheline Rough, MD   Please contact Palliative Medicine Team phone at [phone] for questions and concerns.  For individual provider: See Shea Evans

## 2019-07-16 ENCOUNTER — Inpatient Hospital Stay (HOSPITAL_COMMUNITY): Payer: Medicare Other

## 2019-07-16 ENCOUNTER — Other Ambulatory Visit: Payer: Self-pay

## 2019-07-16 DIAGNOSIS — G9340 Encephalopathy, unspecified: Secondary | ICD-10-CM | POA: Diagnosis not present

## 2019-07-16 DIAGNOSIS — J9601 Acute respiratory failure with hypoxia: Secondary | ICD-10-CM | POA: Diagnosis not present

## 2019-07-16 DIAGNOSIS — R410 Disorientation, unspecified: Secondary | ICD-10-CM | POA: Diagnosis not present

## 2019-07-16 DIAGNOSIS — I249 Acute ischemic heart disease, unspecified: Secondary | ICD-10-CM | POA: Diagnosis not present

## 2019-07-16 LAB — GLUCOSE, CAPILLARY
Glucose-Capillary: 124 mg/dL — ABNORMAL HIGH (ref 70–99)
Glucose-Capillary: 121 mg/dL — ABNORMAL HIGH (ref 70–99)
Glucose-Capillary: 125 mg/dL — ABNORMAL HIGH (ref 70–99)
Glucose-Capillary: 106 mg/dL — ABNORMAL HIGH (ref 70–99)

## 2019-07-16 LAB — COMPREHENSIVE METABOLIC PANEL
ALT: 41 U/L (ref 0–44)
AST: 62 U/L — ABNORMAL HIGH (ref 15–41)
Albumin: 4 g/dL (ref 3.5–5.0)
Alkaline Phosphatase: 90 U/L (ref 38–126)
Anion gap: 11 (ref 5–15)
BUN: 42 mg/dL — ABNORMAL HIGH (ref 8–23)
CO2: 21 mmol/L — ABNORMAL LOW (ref 22–32)
Calcium: 9.4 mg/dL (ref 8.9–10.3)
Chloride: 114 mmol/L — ABNORMAL HIGH (ref 98–111)
Creatinine, Ser: 0.82 mg/dL (ref 0.61–1.24)
GFR calc Af Amer: >60 mL/min (ref >60)
GFR calc non Af Amer: >60 mL/min (ref >60)
Glucose, Bld: 131 mg/dL — ABNORMAL HIGH (ref 70–99)
Potassium: 3.1 mmol/L — ABNORMAL LOW (ref 3.5–5.1)
Sodium: 146 mmol/L — ABNORMAL HIGH (ref 135–145)
Total Bilirubin: 1.4 mg/dL — ABNORMAL HIGH (ref 0.3–1.2)
Total Protein: 6.8 g/dL (ref 6.5–8.1)

## 2019-07-16 LAB — CBC WITH DIFFERENTIAL/PLATELET
Abs Immature Granulocytes: 0.09 10*3/uL — ABNORMAL HIGH (ref 0.00–0.07)
Basophils Absolute: 0 10*3/uL (ref 0.0–0.1)
Basophils Relative: 0 %
Eosinophils Absolute: 0 10*3/uL (ref 0.0–0.5)
Eosinophils Relative: 0 %
HCT: 40.1 % (ref 39.0–52.0)
Hemoglobin: 14.7 g/dL (ref 13.0–17.0)
Immature Granulocytes: 1 %
Lymphocytes Relative: 8 %
Lymphs Abs: 0.8 10*3/uL (ref 0.7–4.0)
MCH: 33.9 pg (ref 26.0–34.0)
MCHC: 36.7 g/dL — ABNORMAL HIGH (ref 30.0–36.0)
MCV: 92.4 fL (ref 80.0–100.0)
Monocytes Absolute: 0.8 10*3/uL (ref 0.1–1.0)
Monocytes Relative: 8 %
Neutro Abs: 8.6 10*3/uL — ABNORMAL HIGH (ref 1.7–7.7)
Neutrophils Relative %: 83 %
Platelets: 157 10*3/uL (ref 150–400)
RBC: 4.34 MIL/uL (ref 4.22–5.81)
RDW: 13.5 % (ref 11.5–15.5)
WBC: 10.4 10*3/uL (ref 4.0–10.5)
nRBC: 0 % (ref 0.0–0.2)

## 2019-07-16 LAB — BLOOD GAS, ARTERIAL
Acid-base deficit: 2.4 mmol/L — ABNORMAL HIGH (ref 0.0–2.0)
Bicarbonate: 20.6 mmol/L (ref 20.0–28.0)
FIO2: 28
O2 Saturation: 98.1 %
Patient temperature: 37
pCO2 arterial: 28.4 mmHg — ABNORMAL LOW (ref 32.0–48.0)
pH, Arterial: 7.475 — ABNORMAL HIGH (ref 7.350–7.450)
pO2, Arterial: 104 mmHg (ref 83.0–108.0)

## 2019-07-16 LAB — TROPONIN I (HIGH SENSITIVITY): Troponin I (High Sensitivity): 1422 ng/L (ref <18)

## 2019-07-16 LAB — POCT I-STAT 7, (LYTES, BLD GAS, ICA,H+H)
Acid-base deficit: 4 mmol/L — ABNORMAL HIGH (ref 0.0–2.0)
Bicarbonate: 20.3 mmol/L (ref 20.0–28.0)
Calcium, Ion: 1.22 mmol/L (ref 1.15–1.40)
HCT: 33 % — ABNORMAL LOW (ref 39.0–52.0)
Hemoglobin: 11.2 g/dL — ABNORMAL LOW (ref 13.0–17.0)
O2 Saturation: 100 %
Patient temperature: 97.5
Potassium: 3 mmol/L — ABNORMAL LOW (ref 3.5–5.1)
Sodium: 150 mmol/L — ABNORMAL HIGH (ref 135–145)
TCO2: 21 mmol/L — ABNORMAL LOW (ref 22–32)
pCO2 arterial: 31.9 mmHg — ABNORMAL LOW (ref 32.0–48.0)
pH, Arterial: 7.408 (ref 7.350–7.450)
pO2, Arterial: 294 mmHg — ABNORMAL HIGH (ref 83.0–108.0)

## 2019-07-16 LAB — AMMONIA: Ammonia: 37 umol/L — ABNORMAL HIGH (ref 9–35)

## 2019-07-16 LAB — TSH: TSH: 0.389 u[IU]/mL (ref 0.350–4.500)

## 2019-07-16 IMAGING — DX DG CHEST 1V PORT
1 series · 1 of 1 positions shown · non-contrast
Comparison: [DATE].

CLINICAL DATA: Endotracheal tube.

EXAM:
PORTABLE CHEST 1 VIEW

[chest]
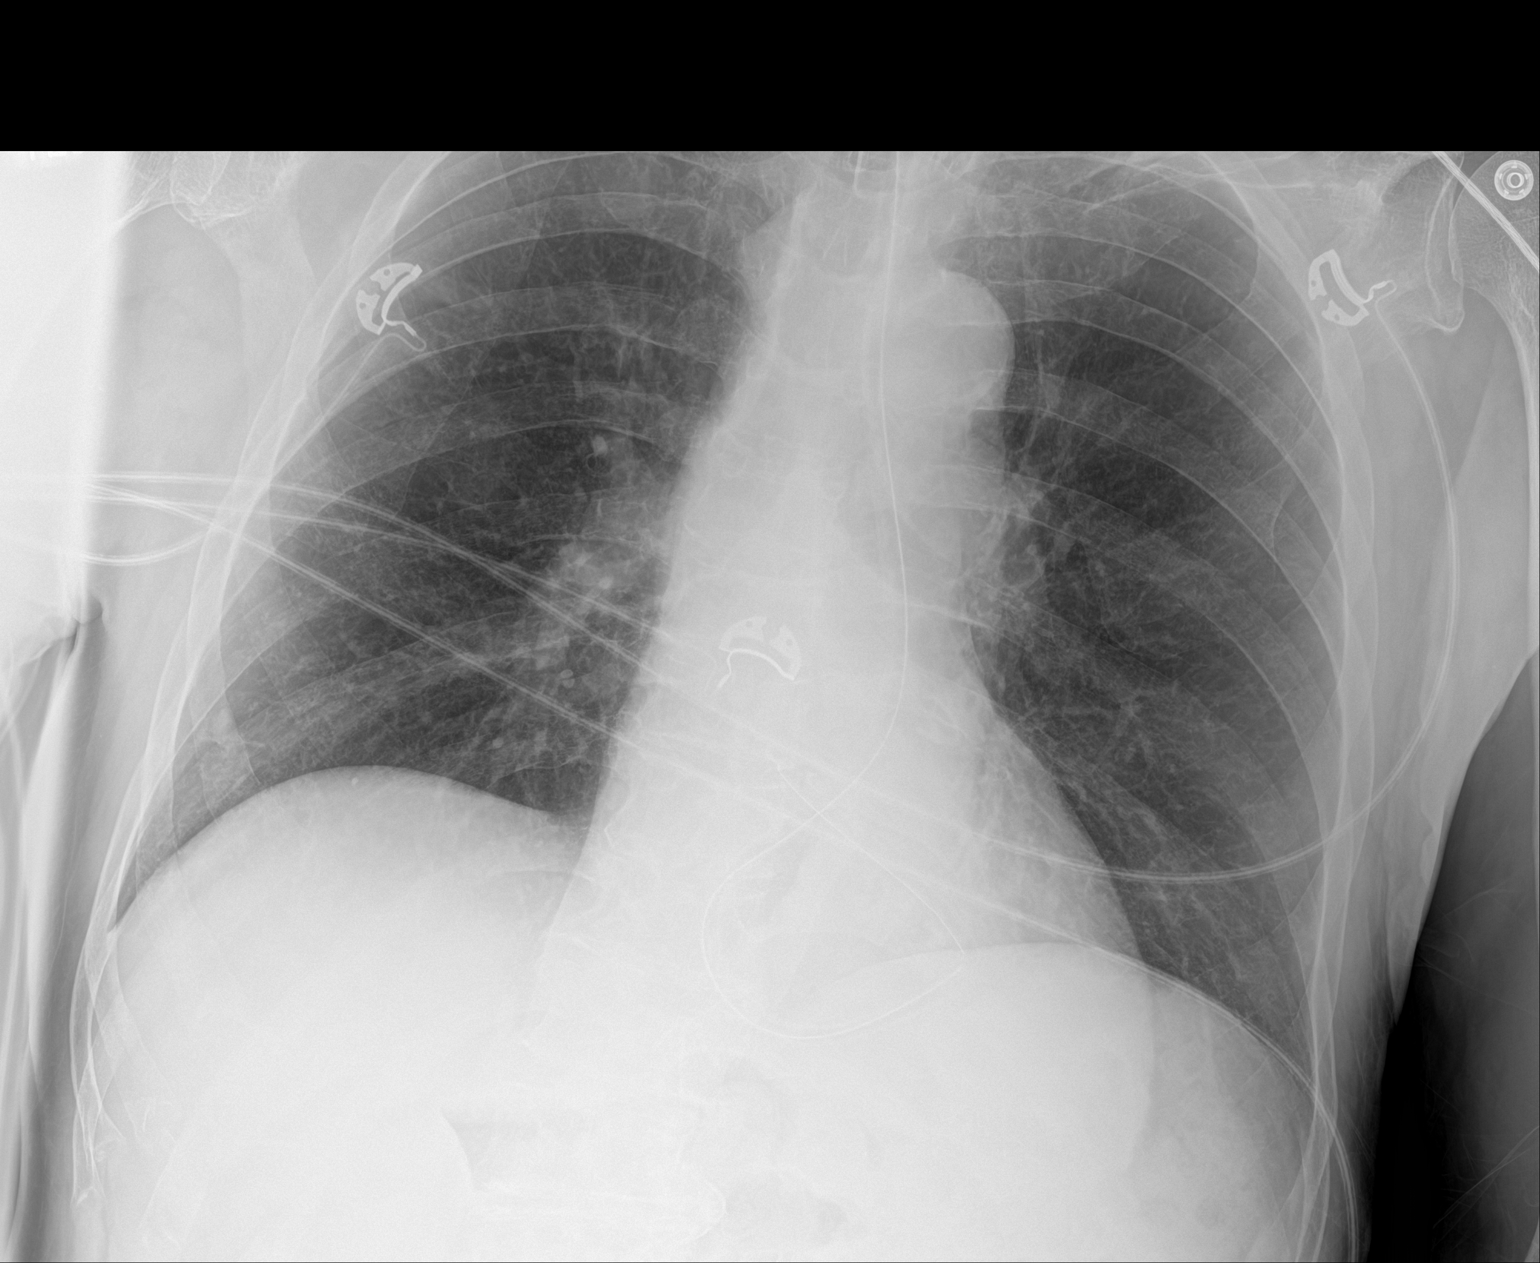

[1 of 1 positions shown; findings below may reference images not displayed]

FINDINGS: Endotracheal tube terminates 7.4 cm above the carina. Nasogastric
tube is coiled within a hiatal hernia. Heart size stable. Lungs are
clear. No pleural fluid.
IMPRESSION: 1. Endotracheal tube terminates 7.4 cm above the carina.
2. Nasogastric tube is coiled in a hiatal hernia.
3. No acute pulmonary parenchymal findings.

## 2019-07-16 IMAGING — MR MR HEAD W/O CM
10 of 11 series · 43 of 48 positions shown · non-contrast
Comparison: Head CT without contrast [DATE].

CLINICAL DATA: 78-year-old male with recent unexplained altered
mental status. Unresponsive this morning with periods of apnea.

EXAM:
MRI HEAD WITHOUT CONTRAST
TECHNIQUE: Multiplanar, multiecho pulse sequences of the brain and surrounding
structures were obtained without intravenous contrast.

[Series 5: DWI · axial · 3.0mm · 0.88mm/px · z∈[-56,+88]mm · 10 of 100 slices shown (1 of 4)]
[im 1/100]
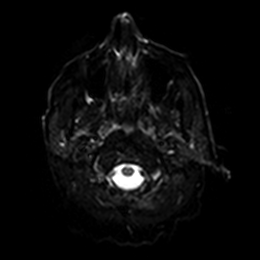
[im 12/100]
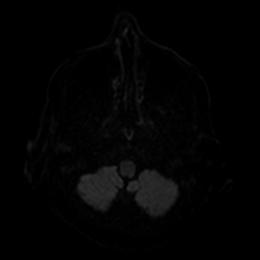
[im 23/100]
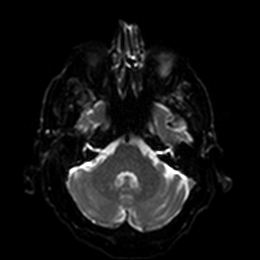
[im 34/100]
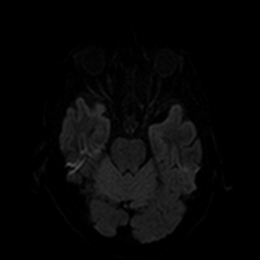
[im 45/100]
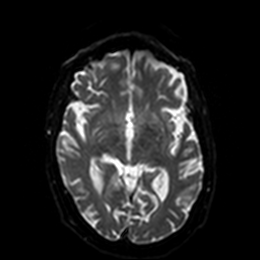
[im 56/100]
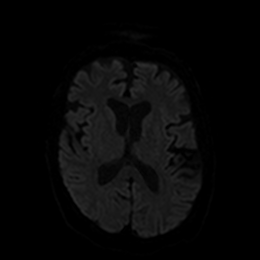
[im 67/100]
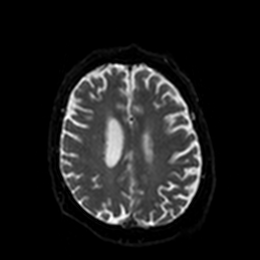
[im 78/100]
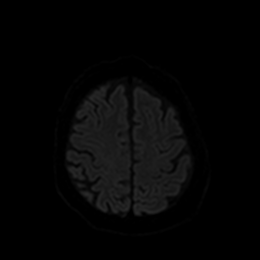
[im 89/100]
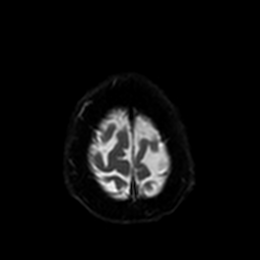
[im 100/100]
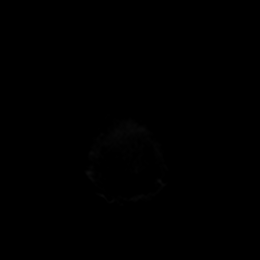

[Series 6: DWI · axial · 3.0mm · 0.88mm/px · z∈[-56,+88]mm · 5 of 50 slices shown (2 of 4)]
[im 1/50]
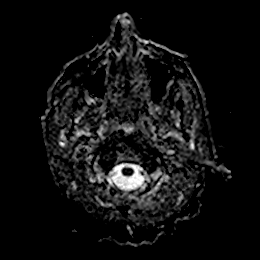
[im 13/50]
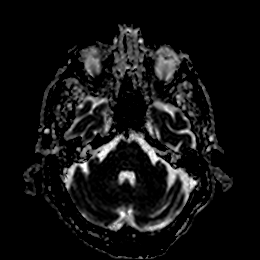
[im 25/50]
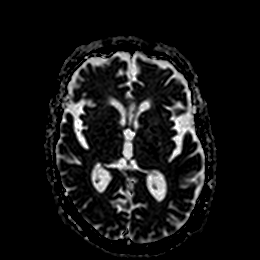
[im 37/50]
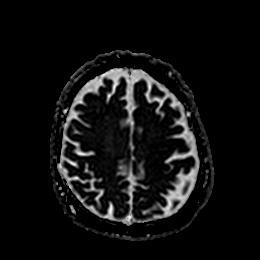
[im 50/50]
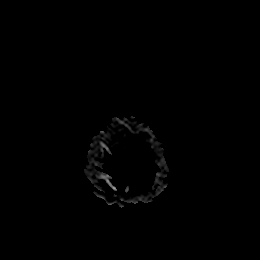

[Series 7: DWI · coronal · 4.0mm · 0.88mm/px · 6 of 70 slices shown (3 of 4)]
[im 1/70]
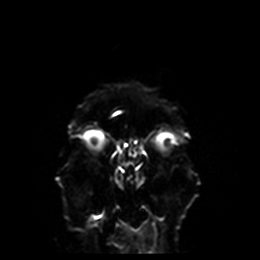
[im 14/70]
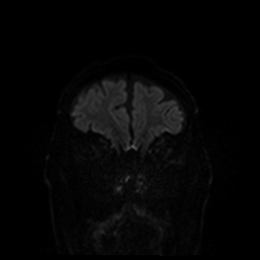
[im 28/70]
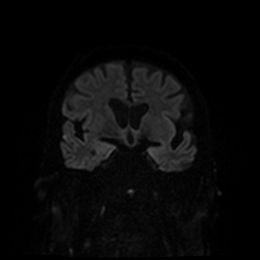
[im 42/70]
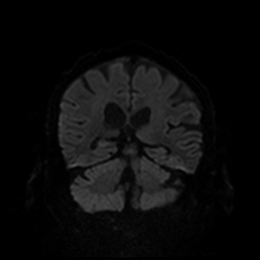
[im 56/70]
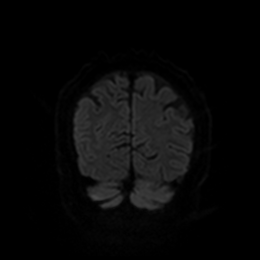
[im 70/70]
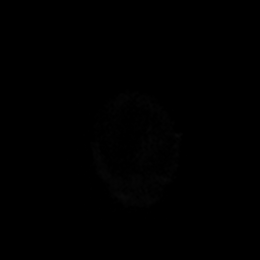

[Series 8: DWI · coronal · 4.0mm · 0.88mm/px · 3 of 35 slices shown (4 of 4)]
[im 1/35]
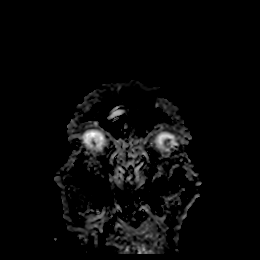
[im 18/35]
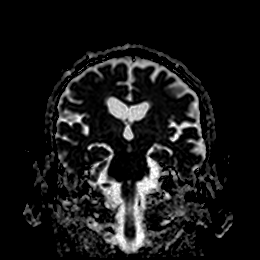
[im 35/35]
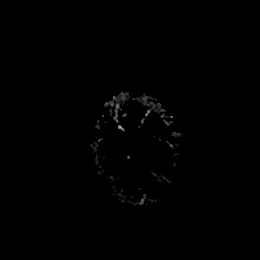

[Series 9: T1 · sagittal · 5.0mm · 0.75mm/px · 2 of 25 slices shown]
[im 1/25]
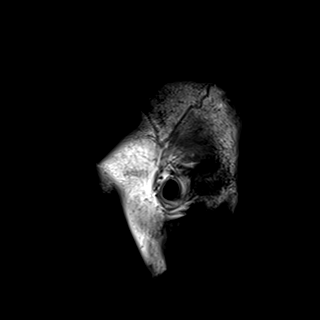
[im 25/25]
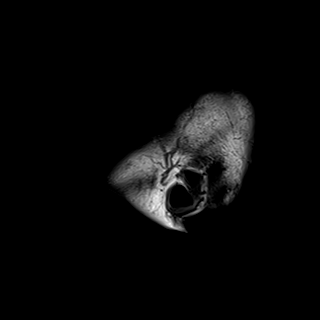

[Series 10: T2 · axial · 5.0mm · 0.72mm/px · z∈[-55,+88]mm · 2 of 25 slices shown (1 of 2)]
[im 1/25]
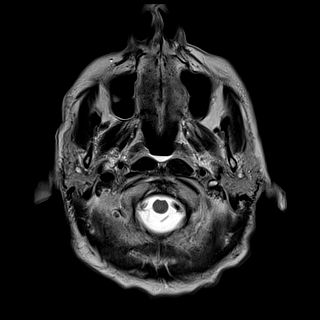
[im 25/25]
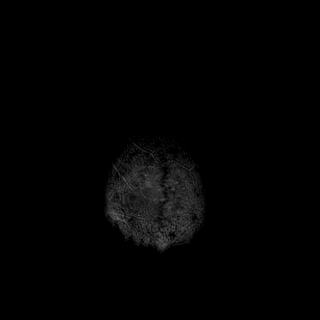

[Series 11: FLAIR · axial · 5.0mm · 0.45mm/px · z∈[-53,+90]mm · 2 of 25 slices shown]
[im 1/25]
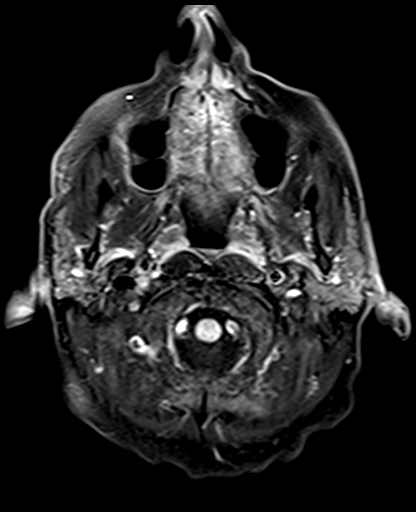
[im 25/25]
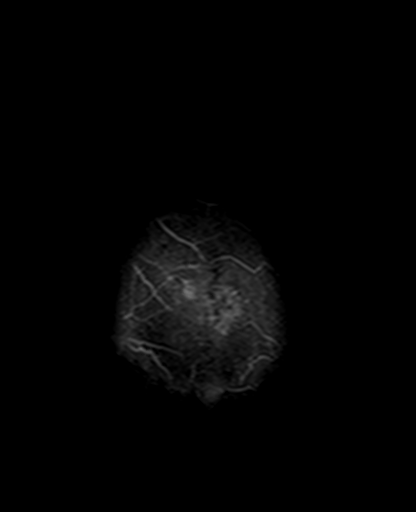

[Series 13: pha_images · axial · 3.0mm · 0.90mm/px · z∈[-66,+108]mm · 5 of 60 slices shown]
[im 1/60]
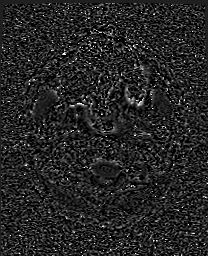
[im 15/60]
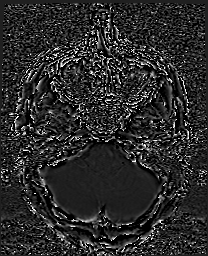
[im 30/60]
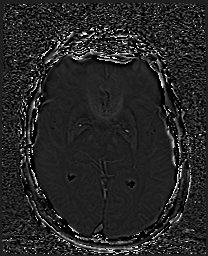
[im 45/60]
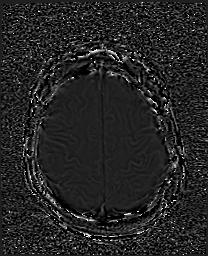
[im 60/60]
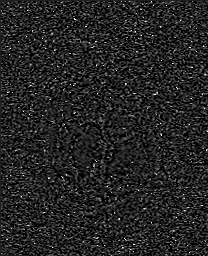

[Series 14: swi_images · axial · 3.0mm · 0.90mm/px · z∈[-66,+108]mm · 5 of 60 slices shown]
[im 1/60]
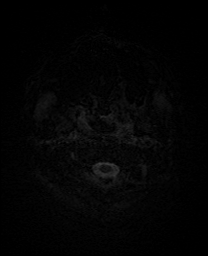
[im 15/60]
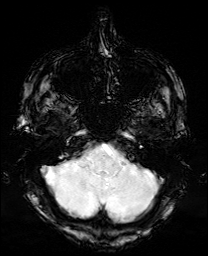
[im 30/60]
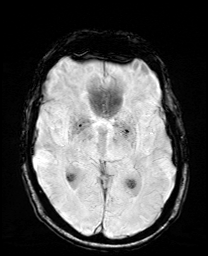
[im 45/60]
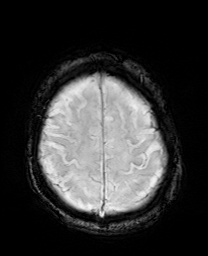
[im 60/60]
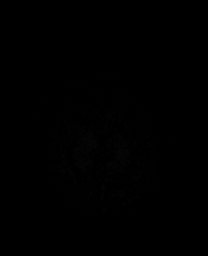

[Series 17: T2 · coronal · 5.0mm · 0.34mm/px · 3 of 29 slices shown (2 of 2)]
[im 1/29]
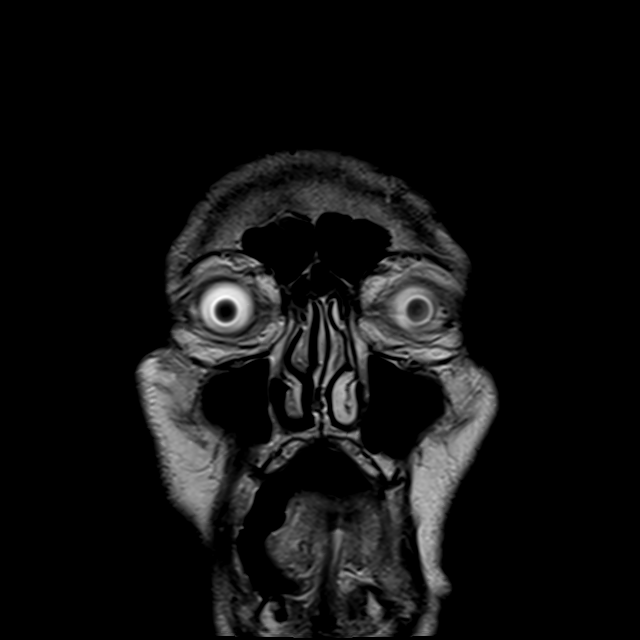
[im 15/29]
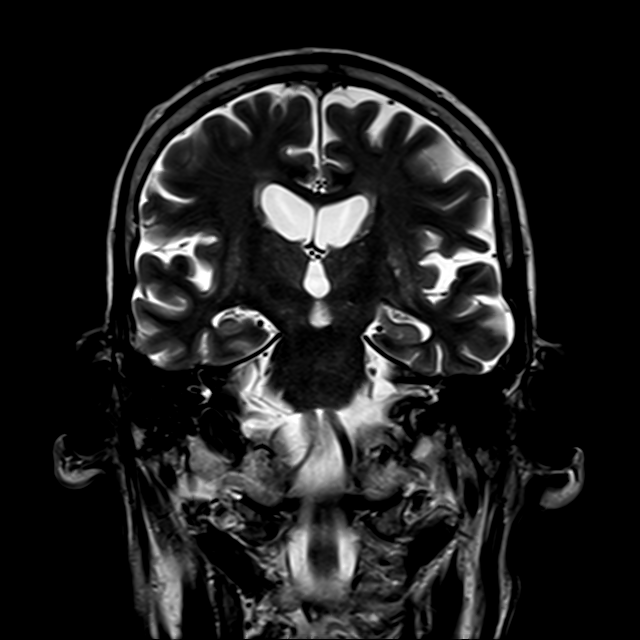
[im 29/29]
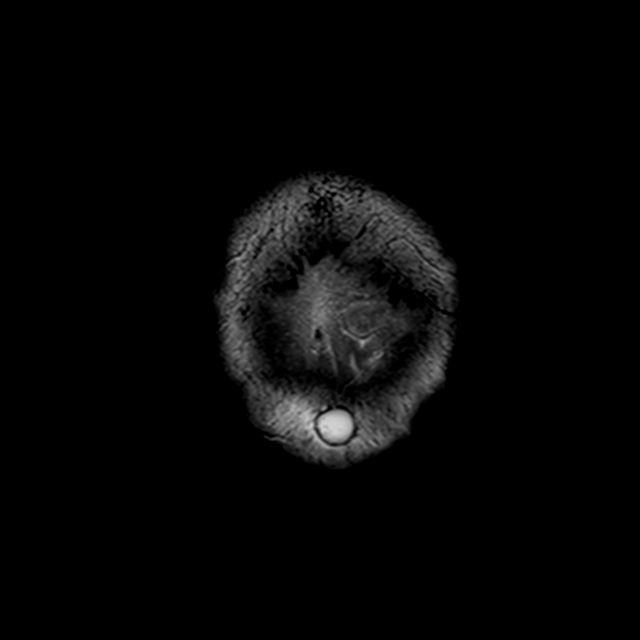

[43 of 48 positions shown; findings below may reference images not displayed]

FINDINGS: Brain: No restricted diffusion to suggest acute infarction. No
midline shift, mass effect, evidence of mass lesion,
ventriculomegaly, extra-axial collection or acute intracranial
hemorrhage. Cervicomedullary junction and pituitary are within
normal limits.

Cerebral volume is within normal limits for age. No cortical
encephalomalacia or chronic cerebral blood products identified. Tiny
chronic lacunar infarct in the left caudate. Other mild T2
heterogeneity in the deep gray nuclei is nonspecific, and not
evident on DWI ir T1 weighted imaging. Negative brainstem and
cerebellum. Minimal for age nonspecific cerebral white matter T2 and
FLAIR hyperintensity.

Vascular: Loss of the distal right vertebral artery flow void on
series 10, image 1. Other Major intracranial vascular flow voids are
preserved.

Skull and upper cervical spine: Negative visible cervical spine.
Visualized bone marrow signal is within normal limits.

Sinuses/Orbits: Negative orbits. Trace paranasal sinus mucosal
thickening.

Other: Intubated. Small volume retained fluid in the pharynx. Trace
mastoid fluid. Visible internal auditory structures appear normal.

Small suboccipital benign scalp lipoma.
IMPRESSION: 1.  No acute intracranial abnormality identified.
2. Mild nonspecific T2 and FLAIR hyperintensity in the deep gray
nuclei, with a tiny chronic lacune in the left caudate.
3. Otherwise negative brain for age.

## 2019-07-16 IMAGING — DX DG ABD PORTABLE 1V
1 series · 1 of 1 positions shown · non-contrast
Comparison: [DATE]

CLINICAL DATA: OG tube placement.

EXAM:
PORTABLE ABDOMEN - 1 VIEW

[abdomen]
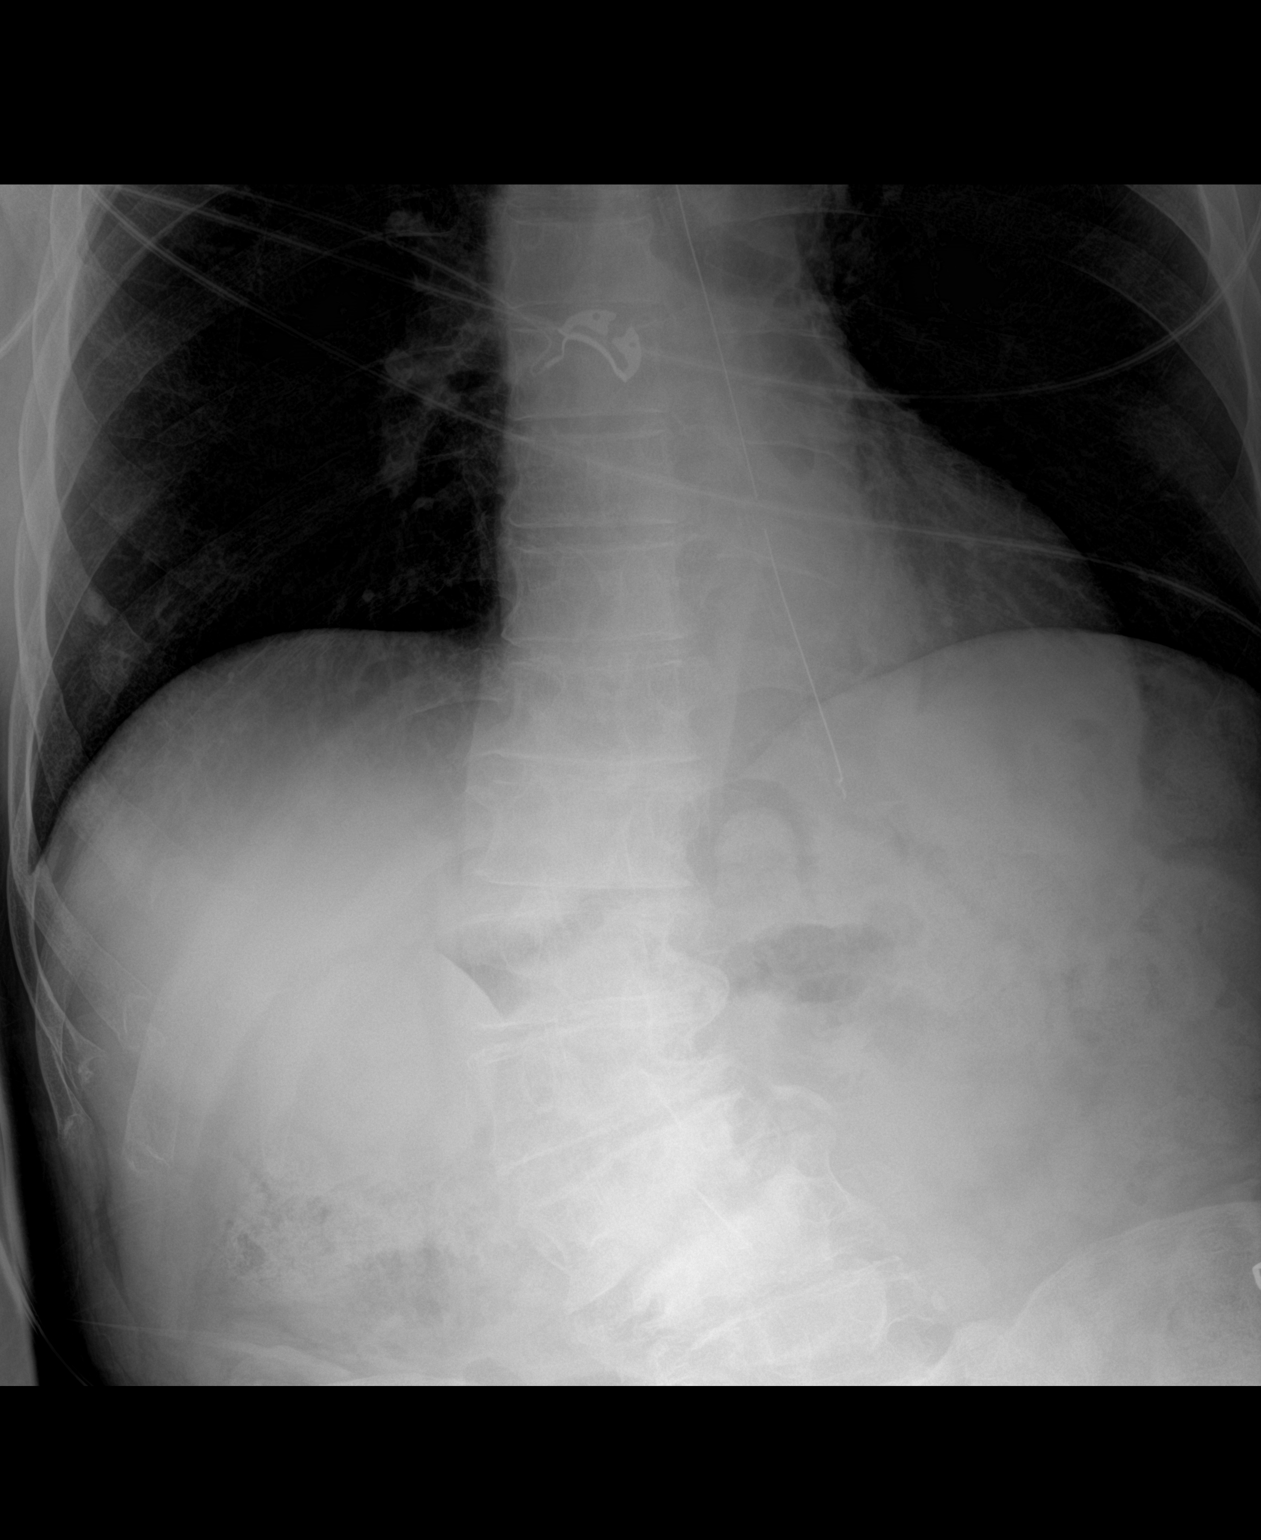

[1 of 1 positions shown; findings below may reference images not displayed]

FINDINGS: Single abdominal radiograph excluding part of the pelvis shows an OG
tube with the tip just below the left hemidiaphragm in terms of
projection. The patient has a large hiatal hernia with inverted
stomach, extending into the chest. Therefore it is difficult to
determine whether this tube is at the EG junction or beyond.
Injection of a small amount of water-soluble contrast with follow-up
radiograph may be helpful to determine placement.

Spinal degenerative changes are present. No signs of acute bone
finding. Marked dextro convexity of the spine.
IMPRESSION: 1. OG tube likely within intrathoracic stomach. Exact side port and
tip location difficult to determine given gastric position.
Injection of a small amount of water-soluble contrast with follow-up
radiograph could potentially be of benefit.

## 2019-07-16 MED ORDER — FENTANYL CITRATE (PF) 100 MCG/2ML IJ SOLN
150.0000 ug | Freq: Once | INTRAMUSCULAR | Status: AC
  Administered 2019-07-16: 150 ug via INTRAVENOUS

## 2019-07-16 MED ORDER — DOCUSATE SODIUM 50 MG/5ML PO LIQD
100.0000 mg | Freq: Two times a day (BID) | ORAL | Status: DC | PRN
  Administered 2019-07-28: 09:00:00 100 mg
  Filled 2019-07-16 (×2): qty 10

## 2019-07-16 MED ORDER — ETOMIDATE 2 MG/ML IV SOLN
40.0000 mg | Freq: Once | INTRAVENOUS | Status: AC
  Administered 2019-07-16: 40 mg via INTRAVENOUS

## 2019-07-16 MED ORDER — FENTANYL CITRATE (PF) 100 MCG/2ML IJ SOLN
INTRAMUSCULAR | Status: AC
  Filled 2019-07-16: qty 4

## 2019-07-16 MED ORDER — FENTANYL CITRATE (PF) 100 MCG/2ML IJ SOLN
25.0000 ug | INTRAMUSCULAR | Status: DC | PRN
  Administered 2019-07-17 (×2): 50 ug via INTRAVENOUS
  Administered 2019-07-18: 100 ug via INTRAVENOUS
  Administered 2019-07-19: 50 ug via INTRAVENOUS
  Filled 2019-07-16 (×4): qty 2

## 2019-07-16 MED ORDER — SODIUM CHLORIDE 0.9 % IV SOLN
2.0000 g | INTRAVENOUS | Status: DC
  Administered 2019-07-16 – 2019-07-19 (×18): 2 g via INTRAVENOUS
  Filled 2019-07-16 (×2): qty 2000
  Filled 2019-07-16: qty 2
  Filled 2019-07-16: qty 2000
  Filled 2019-07-16 (×2): qty 2
  Filled 2019-07-16 (×15): qty 2000

## 2019-07-16 MED ORDER — VANCOMYCIN HCL 10 G IV SOLR
1500.0000 mg | Freq: Once | INTRAVENOUS | Status: AC
  Administered 2019-07-16: 1500 mg via INTRAVENOUS
  Filled 2019-07-16: qty 1500

## 2019-07-16 MED ORDER — LABETALOL HCL 5 MG/ML IV SOLN
INTRAVENOUS | Status: AC
  Filled 2019-07-16: qty 4

## 2019-07-16 MED ORDER — FENTANYL CITRATE (PF) 100 MCG/2ML IJ SOLN: 25.0000 ug | INTRAMUSCULAR | Status: DC | PRN

## 2019-07-16 MED ORDER — MIDAZOLAM HCL 2 MG/2ML IJ SOLN
2.0000 mg | Freq: Once | INTRAMUSCULAR | Status: AC
  Administered 2019-07-16: 2 mg via INTRAVENOUS

## 2019-07-16 MED ORDER — NALOXONE HCL 0.4 MG/ML IJ SOLN
INTRAMUSCULAR | Status: AC
  Administered 2019-07-16: 0.4 mg
  Filled 2019-07-16: qty 1

## 2019-07-16 MED ORDER — NALOXONE HCL 0.4 MG/ML IJ SOLN
INTRAMUSCULAR | Status: AC
  Administered 2019-07-16: 11:00:00
  Filled 2019-07-16: qty 1

## 2019-07-16 MED ORDER — MIDAZOLAM HCL 2 MG/2ML IJ SOLN
INTRAMUSCULAR | Status: AC
  Filled 2019-07-16: qty 4

## 2019-07-16 MED ORDER — LABETALOL HCL 5 MG/ML IV SOLN
10.0000 mg | Freq: Once | INTRAVENOUS | Status: AC
  Administered 2019-07-16: 10 mg via INTRAVENOUS

## 2019-07-16 MED ORDER — DEXTROSE 5 % IV SOLN
10.0000 mg/kg | Freq: Three times a day (TID) | INTRAVENOUS | Status: DC
  Administered 2019-07-16 – 2019-07-19 (×9): 700 mg via INTRAVENOUS
  Filled 2019-07-16 (×8): qty 14
  Filled 2019-07-16: qty 10
  Filled 2019-07-16 (×2): qty 14

## 2019-07-16 MED ORDER — POTASSIUM CHLORIDE 10 MEQ/100ML IV SOLN
10.0000 meq | INTRAVENOUS | Status: AC
  Administered 2019-07-16 (×4): 10 meq via INTRAVENOUS
  Filled 2019-07-16 (×4): qty 100

## 2019-07-16 MED ORDER — VANCOMYCIN HCL IN DEXTROSE 750-5 MG/150ML-% IV SOLN
750.0000 mg | Freq: Two times a day (BID) | INTRAVENOUS | Status: DC
  Administered 2019-07-17 – 2019-07-20 (×8): 750 mg via INTRAVENOUS
  Filled 2019-07-16 (×9): qty 150

## 2019-07-16 MED ORDER — SODIUM CHLORIDE 0.9 % IV SOLN
2.0000 g | Freq: Two times a day (BID) | INTRAVENOUS | Status: DC
  Administered 2019-07-16 – 2019-07-19 (×7): 2 g via INTRAVENOUS
  Filled 2019-07-16 (×7): qty 20

## 2019-07-16 MED ORDER — PROPOFOL 1000 MG/100ML IV EMUL
0.0000 ug/kg/min | INTRAVENOUS | Status: DC
  Administered 2019-07-16: 10 ug/kg/min via INTRAVENOUS
  Administered 2019-07-17 (×3): 20 ug/kg/min via INTRAVENOUS
  Filled 2019-07-16 (×4): qty 100

## 2019-07-16 MED ORDER — ORAL CARE MOUTH RINSE
15.0000 mL | OROMUCOSAL | Status: DC
  Administered 2019-07-16 – 2019-07-19 (×30): 15 mL via OROMUCOSAL

## 2019-07-16 MED ORDER — NALOXONE HCL 0.4 MG/ML IJ SOLN
0.4000 mg | INTRAMUSCULAR | Status: DC | PRN
  Administered 2019-07-16: 0.4 mg via INTRAVENOUS

## 2019-07-16 MED ORDER — CHLORHEXIDINE GLUCONATE 0.12% ORAL RINSE (MEDLINE KIT)
15.0000 mL | Freq: Two times a day (BID) | OROMUCOSAL | Status: DC
  Administered 2019-07-16 – 2019-07-19 (×6): 15 mL via OROMUCOSAL

## 2019-07-16 MED ORDER — NALOXONE HCL 0.4 MG/ML IJ SOLN: 0.4000 mg | INTRAMUSCULAR | Status: DC | PRN

## 2019-07-16 NOTE — Progress Notes (Signed)
Late entry: Result of 12 lead EKG called to Cecilie Kicks with Cardiology.

## 2019-07-16 NOTE — Procedures (Addendum)
I was present and readily available for the entire procedure. Video assisted mac4 blade was utilized. Full view. Large amounts of dried/bloody secretions suctioned and removed from posterior. sats remains high 90's thru entire procedure.  Pt tolerated well.    Audria Nine DO After hours pager: [phone]  [facility] 07/16/2019, 5:01 PM   Intubation Procedure Note Riley NICHTER [id number] [birth year]  Procedure: Intubation Indications: Respiratory insufficiency  Procedure Details Consent: Risks of procedure as well as the alternatives and risks of each were explained to the (patient/caregiver).  Consent for procedure obtained. Time Out: Verified patient identification, verified procedure, site/side was marked, verified correct patient position, special equipment/implants available, medications/allergies/relevent history reviewed, required imaging and test results available.  Performed  Maximum sterile technique was used including cap, gloves, hand hygiene and mask.  Glidscope use for visualization     Evaluation Hemodynamic Status: BP stable throughout; O2 sats: stable throughout Patient's Current Condition: stable Complications: No apparent complications Patient did tolerate procedure well. Chest X-ray ordered to verify placement.  CXR: pending.   Johnsie Cancel, NP-C [facility] After hours pager: [phone]. 07/16/2019, 12:14 PM

## 2019-07-16 NOTE — Progress Notes (Signed)
PROGRESS NOTE  Riley Eaton [id number] DOB: 12/13/1940 DOA: 07/13/2019 PCP: Aletha Halim., PA-C  Brief History   The patient is a 78 yr old man who presented to [facility] ED on 07/13/2019 with complaints of right upper quadrant pain. He had been seen at [facility] on 07/12/2019 for the same complaints. CT of the abdomen and pelvis on 07/12/2019 demonstrated cholelithiasis, but no evidence of cholecystitis. Surgery was consulted, and they felt that this could be managed as outpatient. The patient was sent home.   He returned to [facility] ED on 06/13/2019 for increase in abdominal pain, nausea and vomiting. They patient also was very agitated upon returning. Per his wife he is acting as he did last summer when he had cholelithiasis requiring ERCP and stone retrieval. The patient apparently characterized the pain as severe at that time. It was right upper quadrant in location. No palliative or provocative factors. Right upper quadrant ultrasound was performed in the ED on 07/13/2019. It demonstrated bilary sludge and a biliary sludge ball in the gallbladder. No findings to suggest an acute cholecystitis at this time.   [facility] were consulted to admit the patient for further evaluation and treatment. Overnight the patient has become more delirious. He is very agitated this morning and cannot follow commands.   Initial troponin was 17 on presentation to [facility] ED. It has increased to 684. The patient has received an aspirin and metoprolol. Cardiology has been consulted and has ordered an echocardiogram.  LFT's remain elevated. Will consider HIDA Scan, GI/General surgery consult pending result of cardiac work up and improvement in the patient's mental status.   Palliative care has been consulted. Out of concern that some of the patient's symptoms may be due to opiate withdrawal (120 mg oxycodone daily with prn hydrocodone) the patient was started on a Fentanyl patch with increased doses of prn morphine.  The patient also received   Rapid response called on am 07/16/2019 due to unresponsive with [place] respirations. Some improvement with Narcan x 2. PCCM consulted.  Consultants   Cardiology  Palliative care  PCCM  Procedures   None  Antibiotics   Anti-infectives (From admission, onward)   Start     Dose/Rate Route Frequency Ordered Stop   07/13/19 1900  cefTRIAXone (ROCEPHIN) 1 g in sodium chloride 0.9 % 100 mL IVPB     1 g 200 mL/hr over 30 Minutes Intravenous Every 24 hours 07/13/19 1825     07/13/19 1900  metroNIDAZOLE (FLAGYL) IVPB 500 mg     500 mg 100 mL/hr over 60 Minutes Intravenous Every 8 hours 07/13/19 1825       Subjective  The patient unresponsive to noxious stimuli with Cheyne Stokes respirations this morning. Responsiveness improved with narcan x 2. PCCM called.   Objective   Vitals:  Vitals:   07/16/19 0800 07/16/19 0805  BP: (!) 164/107 (!) 160/97  Pulse: 100   Resp: (!) 24   Temp: 98.6 F (37 C)   SpO2: 99%    Exam:  Constitutional:  The patient was unresponsive upon arrival after Narcan 0.4 x 1. More responsive to sternal rub with repeated dosing. Fentanyl patch removed.  Eyes:   Pupils sluggish Respiratory:   Cheyne Stokes respirations  No wheezes, rales, or rhonchi  Lung sounds are diminished bilaterally  No tactile fremitus Cardiovascular:   Regular rate and rhythm.  Tachycardic  No murmurs, ectopy, or gallups.  No lateral PMI. No thrills. Abdomen:   Abdomen is soft, non-tender, non-distended  Abdomen is scaffoid.  No hernias, masses, or organomegaly  Hypoactive bowel sounds.  Musculoskeletal:   No cyanosis, clubbing, or edema  Cachectic Skin:   No rashes, lesions, ulcers  palpation of skin: no induration or nodules Neurologic:   Unable to evaluate as the patient is unable to cooperate with exam. Psychiatric:   Unable to evaluate as the patient is unable to cooperate with exam.  I have personally  reviewed the following:   Today's Data   Vitals, CMP, Troponins, CBC  Cardiology Data   EKG (07/13/2019, 07/14/2019)  Other Data   CT Abdomen, Right upper quadrant ultrasound.  Scheduled Meds:  Chlorhexidine Gluconate Cloth  6 each Topical Daily   cloNIDine  0.1 mg Transdermal Weekly   enoxaparin (LOVENOX) injection  40 mg Subcutaneous Q24H   fentaNYL  1 patch Transdermal Q72H   metoprolol tartrate  10 mg Intravenous Q6H   naloxegol oxalate  25 mg Oral Daily   naloxone       pantoprazole (PROTONIX) IV  40 mg Intravenous Q24H   Continuous Infusions:  cefTRIAXone (ROCEPHIN)  IV 1 g (07/15/19 2015)   metronidazole 500 mg (07/16/19 0242)   potassium chloride 10 mEq (07/16/19 1014)    Active Problems:   Chronic low back pain   Choledocholithiasis   Acute metabolic encephalopathy   Mental status alteration   ACS (acute coronary syndrome) ([place])   LOS: 3 days   A & P   NSTEMI: Troponins have elevated from 17 to 684 to 1000. EKG earlier today demonstrated septal st segment depressions.Cardiology has been consulted and is checking an echocardiogram. The patient has been given an aspirin and IV lopressor. This could possibly be the cause of the patient's "right upper quadrant" abdominal pain. Continue to monitor on telemetry. I have discussed the patient with Dr. Margaretann Loveless. Echocardiogram was performed on 07/14/2019. EF 30-35% with grossly reduced function in the left ventricle. Grade 1 diastolic dysfunction. Pt was admitted to a progressive care bed.   Unresponsive: Rapid response called. Fentanyl patch removed and patient received narcan x 2 with minimal, but definite improvement in responsiveness. GCS about 7. PCCM consulted for possible transfer to ICU with narcan gtt/sirway support.  Cholangitis/Cholecystitis ([place]?): Pt does not appear septic. No fever, no elevation of WBC, normal lactic acid, hypertention with a mild elevation of heart rate. Procalcitonin is  negative. Rt upper quadrant ultrasound demonstrates no gallbladder wall thickening or pericholecystic edema. It does demonstrate sludge and a "sludge ball". No CBD dilatation is noted. LFT's are mildly elevated. The patient will need MRCP and HIDA scan, but this will have to wait until cardiac/encephalopathic issues are sorted out.   Delirium: Patient is agitated and confused. He is requiring restraints. He is not verbally communicative with me and will not follow commands. I do not believe that he is able to cooperate with any procedure or study at this time. Delirium is likely due to acute illness. His wife has asked if he could have visitors. I have told her that I believe that it may be helpful to him to have a family member at bedside. His wife stated that their daughter may come. CT head was performed. There were serious motion artifacts with reduce the value of the images, but no acute pathology was seen by radiology. I have consulted palliative care.  Hypertension: IV metoprolol. Monitor blood pressure and heart rate. Will increase dose of metoprolol. BP higher after fentanyl patch removed and narcan given.   History of thoracic aortic  aneurysm: Noted.   I have seen and examined this patient myself. I have spent 54 minutes in his evaluation and care. I have spent more than 50% of this in coordination of care with Dr. Elsworth Soho and in rapid response. I will contact family after it is clear whether or not he will be going to ICU.  CODE STATUS: Full DVT Prophylaxis: Heparin gtt as per cardiology Family Communication: None as of yet. Disposition: tbd  Gearold Wainer, DO [facility] Direct contact: see [email or web address]  7PM-7AM contact night coverage as above 07/16/2019, 10:45 AM  LOS: 1 day

## 2019-07-16 NOTE — Progress Notes (Addendum)
Progress Note  Patient Name: Riley Eaton Date of Encounter: 07/16/2019  Primary Cardiologist: Quay Burow, MD   Subjective   Pt now intubated on Vent per CCM and in ICU.    Inpatient Medications    Scheduled Meds:  Chlorhexidine Gluconate Cloth  6 each Topical Daily   cloNIDine  0.1 mg Transdermal Weekly   enoxaparin (LOVENOX) injection  40 mg Subcutaneous Q24H   fentaNYL  1 patch Transdermal Q72H   metoprolol tartrate  10 mg Intravenous Q6H   naloxegol oxalate  25 mg Oral Daily   naloxone       pantoprazole (PROTONIX) IV  40 mg Intravenous Q24H   Continuous Infusions:  cefTRIAXone (ROCEPHIN)  IV 1 g (07/15/19 2015)   metronidazole 500 mg (07/16/19 0242)   potassium chloride 10 mEq (07/16/19 1014)   PRN Meds: haloperidol lactate, hydrALAZINE, ketorolac, LORazepam, morphine injection, naLOXone (NARCAN)  injection   Vital Signs    Vitals:   07/16/19 0101 07/16/19 0400 07/16/19 0800 07/16/19 0805  BP: (!) 154/97 (!) 151/89 (!) 164/107 (!) 160/97  Pulse: 90 (!) 101 100   Resp: (!) 28 (!) 33 (!) 24   Temp:  99.6 F (37.6 C) 98.6 F (37 C)   TempSrc:  Axillary Axillary   SpO2: 98% 98% 99%   Weight:        Intake/Output Summary (Last 24 hours) at 07/16/2019 1030 Last data filed at 07/16/2019 1011 Gross per 24 hour  Intake 590.57 ml  Output 1000 ml  Net -409.43 ml   Last 3 Weights 07/14/2019 02/01/2019 09/25/2018  Weight (lbs) 154 lb 1.6 oz 162 lb 158 lb  Weight (kg) 69.9 kg 73.483 kg 71.668 kg      Telemetry    SR  - Personally Reviewed  ECG    No new - Personally Reviewed  Physical Exam   GEN: No acute distress.sedated on vent    Neck: No JVD Cardiac: RRR, no murmurs, rubs, or gallops.  Respiratory: Clear to diminished auscultation bilaterally. GI: Soft, nontender, non-distended  MS: No edema; No deformity. Neuro:  Nonfocal  Psych: Normal affect   Labs    High Sensitivity Troponin:   Recent Labs  Lab 07/14/19 0757  07/14/19 1158 07/14/19 1409 07/15/19 0841 07/15/19 1220  TROPONINIHS 684* 1,004* 974* 2,079* 2,110*      Chemistry Recent Labs  Lab 07/14/19 0757 07/15/19 0319 07/15/19 1438 07/16/19 0409  NA 144 143 147* 146*  K 3.2* 3.0* 4.0 3.1*  CL 110 110 111 114*  CO2 22 19* 22 21*  GLUCOSE 144* 127* 137* 131*  BUN 23 25* 33* 42*  CREATININE 0.74 0.69 0.78 0.82  CALCIUM 9.3 9.3 9.7 9.4  PROT 7.1   7.0 6.9  --  6.8  ALBUMIN 4.2   4.2 4.2  --  4.0  AST 54*   55* 74*  --  62*  ALT 29   28 39  --  41  ALKPHOS 91   87 92  --  90  BILITOT 1.7*   1.5* 1.9*  --  1.4*  GFRNONAA >60 >60 >60 >60  GFRAA >60 >60 >60 >60  ANIONGAP 12 14 14 11      Hematology Recent Labs  Lab 07/14/19 0757 07/15/19 0319 07/16/19 0409  WBC 8.1 12.6* 10.4  RBC 3.92* 4.27 4.34  HGB 13.2 14.5 14.7  HCT 36.9* 39.2 40.1  MCV 94.1 91.8 92.4  MCH 33.7 34.0 33.9  MCHC 35.8 37.0* 36.7*  RDW 13.5  13.3 13.5  PLT 105* 138* 157    BNPNo results for input(s): BNP, PROBNP in the last 168 hours.   DDimer No results for input(s): DDIMER in the last 168 hours.   Radiology    Ct Head Wo Contrast  Result Date: 07/15/2019 CLINICAL DATA:  Altered level of consciousness (LOC), unexplained EXAM: CT HEAD WITHOUT CONTRAST TECHNIQUE: Contiguous axial images were obtained from the base of the skull through the vertex without intravenous contrast. COMPARISON:  None. FINDINGS: Patient had difficulty tolerating the exam. Despite repeat acquisition, there is moderate motion artifact. Brain: No evidence of intracranial hemorrhage, evaluation limited by motion artifact. No hydrocephalus or midline shift. No mass effect. No evidence of territorial ischemia motion limits more detailed assessment. Suggestion of chronic small vessel disease, however details obscured. Vascular: No gross hyperdense vessel. Skull: Significant motion limitations. No evidence of fracture or focal lesion. Sinuses/Orbits: No acute findings allowing for motion.  Other: None. IMPRESSION: Significant motion limitations. No evidence of acute abnormality. Consider repeat exam based on clinical concern when patient is able to hold still. Electronically Signed   By: Keith Rake M.D.   On: 07/15/2019 13:00    Cardiac Studies   Echo 07/14/19 1. Technically difficult study. Left ventricular ejection fraction, by visual estimation, is 30 to 35%. The left ventricle has grossly severely decreased function. Images are not sufficient to assess for regional wall motion abnormalities. There is  mildly increased left ventricular hypertrophy. 2. Left ventricular diastolic parameters are consistent with Grade I diastolic dysfunction (impaired relaxation). 3. Global right ventricle has normal systolic function.The right ventricular size is normal. 4. The mitral valve is normal in structure. No evidence of mitral valve regurgitation. 5. The tricuspid valve is not well visualized. Tricuspid valve regurgitation is not demonstrated. 6. The aortic valve was not well visualized. Aortic valve regurgitation is not visualized. 7. The pulmonic valve was not well visualized. Pulmonic valve regurgitation is not visualized.   Patient Profile     78 y.o. 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   Assessment & Plan    Abdominal Pain H/o of gallstones with ERCP and stenting earlier this year, was unable to follow up this summer due to covid. Second ER visit this week for abdominal pain. CT abd and RUQ [place] negative for acute cholecystitis, but showed gallstones. LFTs minimally elevated. WBC normal. Lactate initially elevated. Procalcitonin normal.  - abx started per CCM - LFTS up some. WBC up to 12.6 now to 10.4 - Blood cultures no growth so far - ?General surgery  consulted/HIDA scan  AMS/Agitation Patient was brought for confusion and agitation via EMS. He was given dilaudid, haldol, ativan, fentanyl. H/o of chronic pain with opoid use. - AMS possibly from narcotic withdrawal vs theaboveproblem - management per IM/CCM --today with decreased respirations needing narcan CCM has been consulted --palliative care consult- family wants full code and expects pt to recover   --plan for MRI of head and LP   Elevated troponin/NSTEMI with drop in EF vs demand ischemia alone  Patient with h/o of thoracic aortic aneurysm and coronary calcifications seen on CT. Myoview in 2017 was normal. EF normal. - HS troponin peaked at 2,110, yesterday. NSTEMI   - Patient with noh/oof chest painaccording to chart review - Echo with  reduced EF of 30-35% -  With reduced EF ischemic eval is warranted, but do not think cardiac procedure would be performed given his current mental state and possible sepsis picture.  It is outside of 48 hour window for anticoagulation  recheck EKG and troponin --troponin decreased and abnormal EKG.  Dr. Margaretann Loveless discussed with Dr. [place] and if no bleeding risk then plan once Mental status is sorted then possible cardiac cath while sedated on vent.       Chronic narcotic use - was on oxycotin 40 mg TID, vicodin 5 q6hours - management per IM   HTN - not on meds at baseline - Pressures elevated on admission, systolics up to [id number] - started on clonidine and IV metoprolol 5 mg q6 hours -with intubation decrease in BP      For questions or updates, please contact [facility] Please consult [email or web address] for contact info under        Signed, Cecilie Kicks, NP  07/16/2019, 10:30 AM   ---------------------------------------------------------------------------------------------   History and all data above reviewed.  Patient examined.  I agree with the findings as above.  Berlie R Hrabe continues to have altered mental status.    Constitutional: altered, agitated. HEENT: normal Cardiovascular: regular rhythm, normal rate, no murmurs. S1 and S2 normal. Respiratory: clear anteriorly. GI : normal bowel sounds, soft and nontender. No distention.   MSK: extremities warm, well perfused. No edema.  NEURO: altered and agitated.  PSYCH: unable to assess.  All available labs, radiology testing, previous records reviewed. Agree with documented assessment and plan of my colleague as stated above with the following additions or changes:  Active Problems:   Chronic low back pain   Choledocholithiasis   Acute metabolic encephalopathy   Mental status alteration   ACS (acute coronary syndrome) ([place])    Plan: patient seen prior to transfer to ICU. Now in unit intubated. Very challenging situation as ECG is concerning for ischemia. With elevated BP does not appear to be cardiogenic shock as primary issue. I would like to see the results of MRI head prior to considering initiation of heparin, if no intracranial issues and no concern for bleeding, we will discuss with family considering coronary angiography if patient remains stable. He has coronary calcifications in proximal left system, and may have significant disease yet to be quantified. Cardiology will follow.    Length of Stay:  LOS: 4 days   Elouise Munroe, MD [facility]

## 2019-07-16 NOTE — Care Management Important Message (Signed)
Important Message  Patient Details  Name: YAZEED CULVER MRN: [id number] Date of Birth: [birth year]   Medicare Important Message Given:  Yes     Shelda Altes 07/16/2019, 11:53 AM

## 2019-07-16 NOTE — Progress Notes (Signed)
On assessment pt more lethargic with cheyne-stokes breathing. Minimally responsive to painful stimuli. MD paged, orders received to give 0.4mg  Narcan and remove fentanyl patch. RR RN called. ABG obtained. MD at bedside to assess.  Clyde Canterbury

## 2019-07-16 NOTE — Progress Notes (Signed)
NAME:  Riley Eaton, MRN:  [id number], DOB:  November 15, 1940, LOS: 3 ADMISSION DATE:  07/13/2019, CONSULTATION DATE:  07/13/19 REFERRING MD:  Roderic Palau  CHIEF COMPLAINT:  AMS, Abd pain   Brief History   Riley Eaton is a 78 y.o. male who presented to ED 12/1 with abd pain and AMS.  Had been seen 1 day earlier and CT abd / pelv showed cholelithiasis without cholecystitis.  History of present illness   Riley Eaton is a 78 y.o. male who has a PMH including but not limited to thoracic aortic aneurysm, CAD, chronic back pain, chronic opioid use (see "past medical history" for rest).  He presented to [facility] ED 12/1 with AMS and abd pain.  He had been seen in [facility] ED 1 day prior for abd pain.  Had CT of the abd at the time that demonstrated cholelithiasis without cholecystitis.  Surgery was consulted and recommended outpatient follow up.  He was subsequently discharged home.  12/1 had worsening of pain along with AMS; therefore, came to [facility] ED.  He was quite agitated per reports and received 1mg  dilaudid, 5mg  haldol, 1mg  ativan x 3 and had minimal response.   PCCM was asked to see in consultation for consideration of precedex.  Per chart review, he has no underlying hx of EtOH abuse or substance abuse.  He does have hx of chronic low back pain and chronic opioid use.  On review of outpatient med list, he had 5-325 norco prescribed for q6hrs PRN, 40mg  oxycontin TID, lyrica 200mg  TID, duloxetine 60mg  BID.  Per chart review, he had admission 09/24/18 through 10/06/18 for acute ascending cholangitis s/p ERCP and sphincterotomy with stenting of pancreatic duct (Dr. Therisa Doyne).  During that admission, he did have severe delirium felt to be due to narcotic withdrawal.  He did spend a few days in ICU and was briefly treated with precedex and fentanyl.  He was discharged and supposed to be evaluated by surgery as an outpatient for cholecystectomy; however, this was postponed due to the [place] pandemic.  Per discharge  summary, his medication regimen was adjusted prior to d/c and pt had been tolerating it well (oxycodone weaned from 80mg  BID to BID, lyrica reduced from 200mg  TID to 100mg  TID, cymbalta from 60mg  BID to 60mg  daily).  Past Medical History  has Thoracic aortic aneurysm ([place]); Coronary artery calcification seen on CT scan; Chronic low back pain; Nausea & vomiting; Choledocholithiasis; Chronic pain; Intractable vomiting; Urinary retention; Constipation; Protein-calorie malnutrition, severe; Acute metabolic encephalopathy; Mental status alteration; and ACS (acute coronary syndrome) ([place]) on their problem list.  Significant Hospital Events   12/1 > admit. 12/4: worsening mental status and resp status unable to protect airway, tachypneic but sats are maintaining on [place] at this time. CCM reconsulted.   Consults:  12/1 PCCM. 12/2: cards  Procedures:  None.  Significant Diagnostic Tests:  CT A / P 11/30 > cholelithiasis, no cholecystitis.  Borderline splenomegaly.  Large hiatal hernia. CT head 12/3 >Significant motion limitations. No evidence of acute abnormality. Consider repeat exam based on clinical concern when patient is able to hold still. RUQ 12/1: 1. Biliary sludge and biliary sludge ball in the gallbladder. No findings to suggest an acute cholecystitis at this time. Echo 12/2: LVEF 99991111, Grade I diastolic dysfunction    Micro Data:  Blood 12/1 > ngtd SARS CoV2 12/1 > NEGATIVE  Antimicrobials:  Ceftriaxone 12/1 >  Flagyl 12/1 >   Interim history/subjective:  12/4: reconsulted for worsening encephalopathy.  Pt unresponsive this am with periods of apnea. Pt received fentanyl patch overnight.  Was given narcan without great improvement and now tachypneic, unable to protect airway and unresponsive.  12/1: Somnolent but opens eyes to noxious stimuli.  Objective:  Blood pressure (!) 160/97, pulse 100, temperature 98.6 F (37 C), temperature source Axillary, resp. rate (!) 24, weight  69.9 kg, SpO2 99 %.        Intake/Output Summary (Last 24 hours) at 07/16/2019 1045 Last data filed at 07/16/2019 1011 Gross per 24 hour  Intake 590.57 ml  Output 1000 ml  Net -409.43 ml   Filed Weights   07/14/19 2046  Weight: 69.9 kg    Examination: General: Elderly male, appears acutely ill in bed, unresponsive but spontaneously moving in bed. tachypneic unable to protect airway Neuro: spontaneously moving in bed but sonorous breath sounds HEENT: /AT. Sclerae anicteric. PERRL. Cardiovascular: RRhythm, tachycardic, no M/R/G.  Lungs: Respirations tachypneic and labored.  CTA bilaterally, No W/R/R. Abdomen: BS x 4, soft, NT/ND.  Musculoskeletal: No gross deformities, no edema.  Skin: Intact, warm, no rashes.  Assessment & Plan:    Acute encephalopathy: - CTH negative -check ammonia, tsh for completeness sake -lactate and pct normal so less likely sepsis playing role -previous h/o opioid withdraw now somnolent with fentanyl patch, after d/w family there has been no missed dosing of opioid at home and prior to presentation on Monday (when he was sent home from [facility]) he was already having some confusion. Unlikely to be opioid withdraw.  -not protecting airway. Will move to ICU and intubate at this time.  -after intubation would be beneficial to get MRI and LP to r/o CNS with persistent encephalopathy without clear etiology.  Acute resp failure:  -intubate for airway protection -titrate vent -rass goal 0  Abd pain - unclear etiology; though, concerning for cholecystitis.  Had ERCP with sphincterotomy and pancreatic duct stenting on 09/25/18. - Empiric ceftriaxone and flagyl for now. 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  Pain/Anxiety/Delirium protocol (if indicated): Fentanyl low dose scheduled as above.   VAP protocol (if indicated): N/A. DVT prophylaxis: Per primary. GI prophylaxis: Per primary. Glucose control: Per primary. Mobility: BR. Code Status: Full. Family Communication: wife via phone. 12/4 Disposition: ICU  Labs   CBC: Recent Labs  Lab 07/12/19 1320 07/13/19 1440 07/13/19 1530 07/14/19 0757 07/15/19 0319 07/16/19 0409  WBC 7.5 7.0  --  8.1 12.6* 10.4  NEUTROABS 6.5 5.4  --   --  10.7* 8.6*  HGB 13.4 13.7 12.2* 13.2 14.5 14.7  HCT 37.2* 38.2* 36.0* 36.9* 39.2 40.1  MCV 93.5 93.9  --  94.1 91.8 92.4  PLT 111* 140*  --  105* 138* [id number]   Basic Metabolic Panel: Recent Labs  Lab 07/13/19 1440 07/13/19 1530 07/14/19 0757 07/15/19 0319 07/15/19 1220 07/15/19 1438 07/16/19 0409  NA 142 143 144 143  --  147* 146*  K 3.2* 3.2* 3.2* 3.0*  --  4.0 3.1*  CL 108 111 110 110  --  111 114*  CO2 18*  --  22 19*  --  22 21*  GLUCOSE 168* 158* 144* 127*  --  137* 131*  BUN 28* 31* 23 25*  --  33* 42*  CREATININE 1.01 0.70 0.74 0.69  --  0.78 0.82  CALCIUM 10.0  --  9.3 9.3  --  9.7 9.4  MG  --   --   --   --  2.2  --   --    GFR: Estimated Creatinine Clearance: 68.3 mL/min (by C-G formula based on SCr of 0.82 mg/dL). Recent Labs  Lab 07/13/19 1440 07/13/19 1500 07/13/19 1845 07/14/19 0757 07/15/19 0319 07/15/19 1809 07/15/19 2113 07/16/19 0409  PROCALCITON  --   --   --  <0.10  --   --   --   --   WBC 7.0  --   --  8.1 12.6*  --   --  10.4  LATICACIDVEN  --  3.4* 1.2  --   --  1.3 1.0  --    Liver Function Tests: Recent Labs  Lab 07/12/19 1320 07/13/19 1440 07/14/19 0757 07/15/19 0319 07/16/19 0409  AST 27 42* 54*  55* 74* 62*  ALT 16 21 29  28  39 41  ALKPHOS 90 93 91  87 92 90  BILITOT 1.4* 2.1* 1.7*  1.5* 1.9* 1.4*  PROT 7.1 7.6 7.1  7.0 6.9 6.8  ALBUMIN 4.2 4.5 4.2  4.2 4.2 4.0   Recent Labs  Lab 07/12/19 1320 07/13/19 1440  LIPASE 17 18   No  results for input(s): AMMONIA in the last 168 hours. ABG    Component Value Date/Time   TCO2 21 (L) 07/13/2019 1530    Coagulation Profile: No results for input(s): INR, PROTIME in the last 168 hours. Cardiac Enzymes: No results for input(s): CKTOTAL, CKMB, CKMBINDEX, TROPONINI in the last 168 hours. HbA1C: No results found for: HGBA1C CBG: Recent Labs  Lab 07/16/19 1028  GLUCAP 124*      Critical care time: The patient is critically ill with multiple organ systems failure and requires high complexity decision making for assessment and support, frequent evaluation and titration of therapies, application of advanced monitoring technologies and extensive interpretation of multiple databases.  Critical care time 35 mins. This represents my time independent of the NPs time taking care of the pt. This is excluding procedures.    Audria Nine DO  Pulmonary and Critical Care 07/16/2019, 10:45 AM

## 2019-07-16 NOTE — Significant Event (Addendum)
Rapid Response Event Note  Overview:  Respiratory - Periods of Apnea/Tachypnea  Neurologic - Decreased LOC.   Initial Focused Assessment: Received a call from nurse with concerns of patient having ongoing tachypnea with periods of apnea. Per nurse, this is has been ongoing since last night. Nurse also informed me that patient is very hard to arouse and that she was spoken with MD already and Narcan 0.4 MG IV x 1 was ordered. Nurse informed me that patient had Fentanyl patch, I asked that she removed the patch, administer the Narcan, and obtain an ABG. CT Head from yesterday - no acute abnormality. Temp 97.5 (O) and blood sugar 124.  Upon arrival, patient was essentially unresponsive. Nurse had just given Narcan 0.4 mg IV x 1. Provider came to the bedside as well. Pupils were sluggish, equal in size, very weak gag, corneal reflexes intact. HR 120s, SBP 170s, MAPs 120s, RR 35-38, 98% on 2L [place]. GCS - 7. Lung sounds - clear in the upper fields, diminished in the lower fields. Another dose of Narcan 0.4 mg IV. Patient is not protecting his airway completely.   Interventions: -- Narcan 0.4 mg IV x 2 -- STAT ABG -- Labetalol 10 mg IV  -- 2nd PIV 20 G LAFA   Plan of Care: -- PCCM consulted by [facility] MD.  -- Transfer to ICU for urgent intubation   Event Summary:  Call Time 1007 Arrival Time 1010 End Time 1210  Riley Eaton R

## 2019-07-16 NOTE — Progress Notes (Signed)
PT transported to MRI and back with no complications. 

## 2019-07-16 NOTE — Progress Notes (Signed)
Chart reviewed and events of this AM noted.  D/w PCCM NP.  Riley Eaton has been transferred to ICU and now intubated due to inability to protect his airway.  Will plan to follow-up early next week to see if we can be of assistance based upon his clinical course over the next couple of days.  Please call at any time if there are specific needs with which we can be of assistance at this time.  Micheline Rough, MD [facility] [phone]  NO CHARGE NOTE

## 2019-07-16 NOTE — Progress Notes (Signed)
Daughter updated on patient care via phone. All questions answered.   Clyde Canterbury, RN

## 2019-07-16 NOTE — Progress Notes (Signed)
Pt transferred to 3M04 with all belongings.  Clyde Canterbury, RN

## 2019-07-16 NOTE — Progress Notes (Signed)
Patient received 5 mg of Hydralazine and scheduled 10 mg Lopressor IV

## 2019-07-16 NOTE — Progress Notes (Signed)
Pharmacy Antibiotic Note  Riley Eaton is a 78 y.o. male admitted on 07/13/2019 and today with worse mental and respiratory status requiring intubation.  Worsening encephalopathy and concern for CNS infection.  Pharmacy has been consulted for vancomycin, ceftriaxone, ampicillin, and acyclovir dosing.  SCr 0.82, CrCl ~ 68 ml/min  Plan: Vancomycin 1500mg  IV x1 then 750mg  IV every 12 hours (no AUC targets with potential CNS infection) Ceftriaxone 2g IV every 12 hours Ampicillin 2g IV every 4 hours Acyclovir 10mg /kg (TBW) 700mg  IV every 8 hours Monitor renal function, Cx/HSV PCR/MRI and LP for r/o of meningitis to narrow  Vancomycin trough as needed with nomogram dosing  Height: 5\' 7"  (170.2 cm) Weight: 154 lb 1.6 oz (69.9 kg) IBW/kg (Calculated) : 66.1  Temp (24hrs), Avg:98.4 F (36.9 C), Min:97.5 F (36.4 C), Max:99.6 F (37.6 C)  Recent Labs  Lab 07/12/19 1320 07/13/19 1440 07/13/19 1500 07/13/19 1530 07/13/19 1845 07/14/19 0757 07/15/19 0319 07/15/19 1438 07/15/19 1809 07/15/19 2113 07/16/19 0409  WBC 7.5 7.0  --   --   --  8.1 12.6*  --   --   --  10.4  CREATININE 0.64 1.01  --  0.70  --  0.74 0.69 0.78  --   --  0.82  LATICACIDVEN  --   --  3.4*  --  1.2  --   --   --  1.3 1.0  --     Estimated Creatinine Clearance: 68.3 mL/min (by C-G formula based on SCr of 0.82 mg/dL).    No Known Allergies  Bertis Ruddy, PharmD Clinical Pharmacist Please check AMION for all [place] numbers 07/16/2019 12:37 PM

## 2019-07-17 ENCOUNTER — Inpatient Hospital Stay (HOSPITAL_COMMUNITY): Payer: Medicare Other

## 2019-07-17 DIAGNOSIS — R638 Other symptoms and signs concerning food and fluid intake: Secondary | ICD-10-CM

## 2019-07-17 DIAGNOSIS — Z978 Presence of other specified devices: Secondary | ICD-10-CM

## 2019-07-17 LAB — CBC WITH DIFFERENTIAL/PLATELET
Abs Immature Granulocytes: 0.05 10*3/uL (ref 0.00–0.07)
Basophils Absolute: 0 10*3/uL (ref 0.0–0.1)
Basophils Relative: 0 %
Eosinophils Absolute: 0 10*3/uL (ref 0.0–0.5)
Eosinophils Relative: 0 %
HCT: 33.6 % — ABNORMAL LOW (ref 39.0–52.0)
Hemoglobin: 11.9 g/dL — ABNORMAL LOW (ref 13.0–17.0)
Immature Granulocytes: 1 %
Lymphocytes Relative: 12 %
Lymphs Abs: 0.7 10*3/uL (ref 0.7–4.0)
MCH: 34.3 pg — ABNORMAL HIGH (ref 26.0–34.0)
MCHC: 35.4 g/dL (ref 30.0–36.0)
MCV: 96.8 fL (ref 80.0–100.0)
Monocytes Absolute: 0.5 10*3/uL (ref 0.1–1.0)
Monocytes Relative: 8 %
Neutro Abs: 4.9 10*3/uL (ref 1.7–7.7)
Neutrophils Relative %: 79 %
Platelets: 113 10*3/uL — ABNORMAL LOW (ref 150–400)
RBC: 3.47 MIL/uL — ABNORMAL LOW (ref 4.22–5.81)
RDW: 14.4 % (ref 11.5–15.5)
WBC: 6.2 10*3/uL (ref 4.0–10.5)
nRBC: 0 % (ref 0.0–0.2)

## 2019-07-17 LAB — GLUCOSE, CAPILLARY
Glucose-Capillary: 97 mg/dL (ref 70–99)
Glucose-Capillary: 95 mg/dL (ref 70–99)

## 2019-07-17 LAB — TRIGLYCERIDES: Triglycerides: 97 mg/dL (ref <150)

## 2019-07-17 IMAGING — DX DG CHEST 1V PORT
1 series · 1 of 1 positions shown · non-contrast
Comparison: Portable exam [OZ] hours compared to [DATE]

Correlation: CT abdomen and pelvis [DATE]

CLINICAL DATA: Orogastric tube placement

EXAM:
PORTABLE CHEST 1 VIEW

[chest]
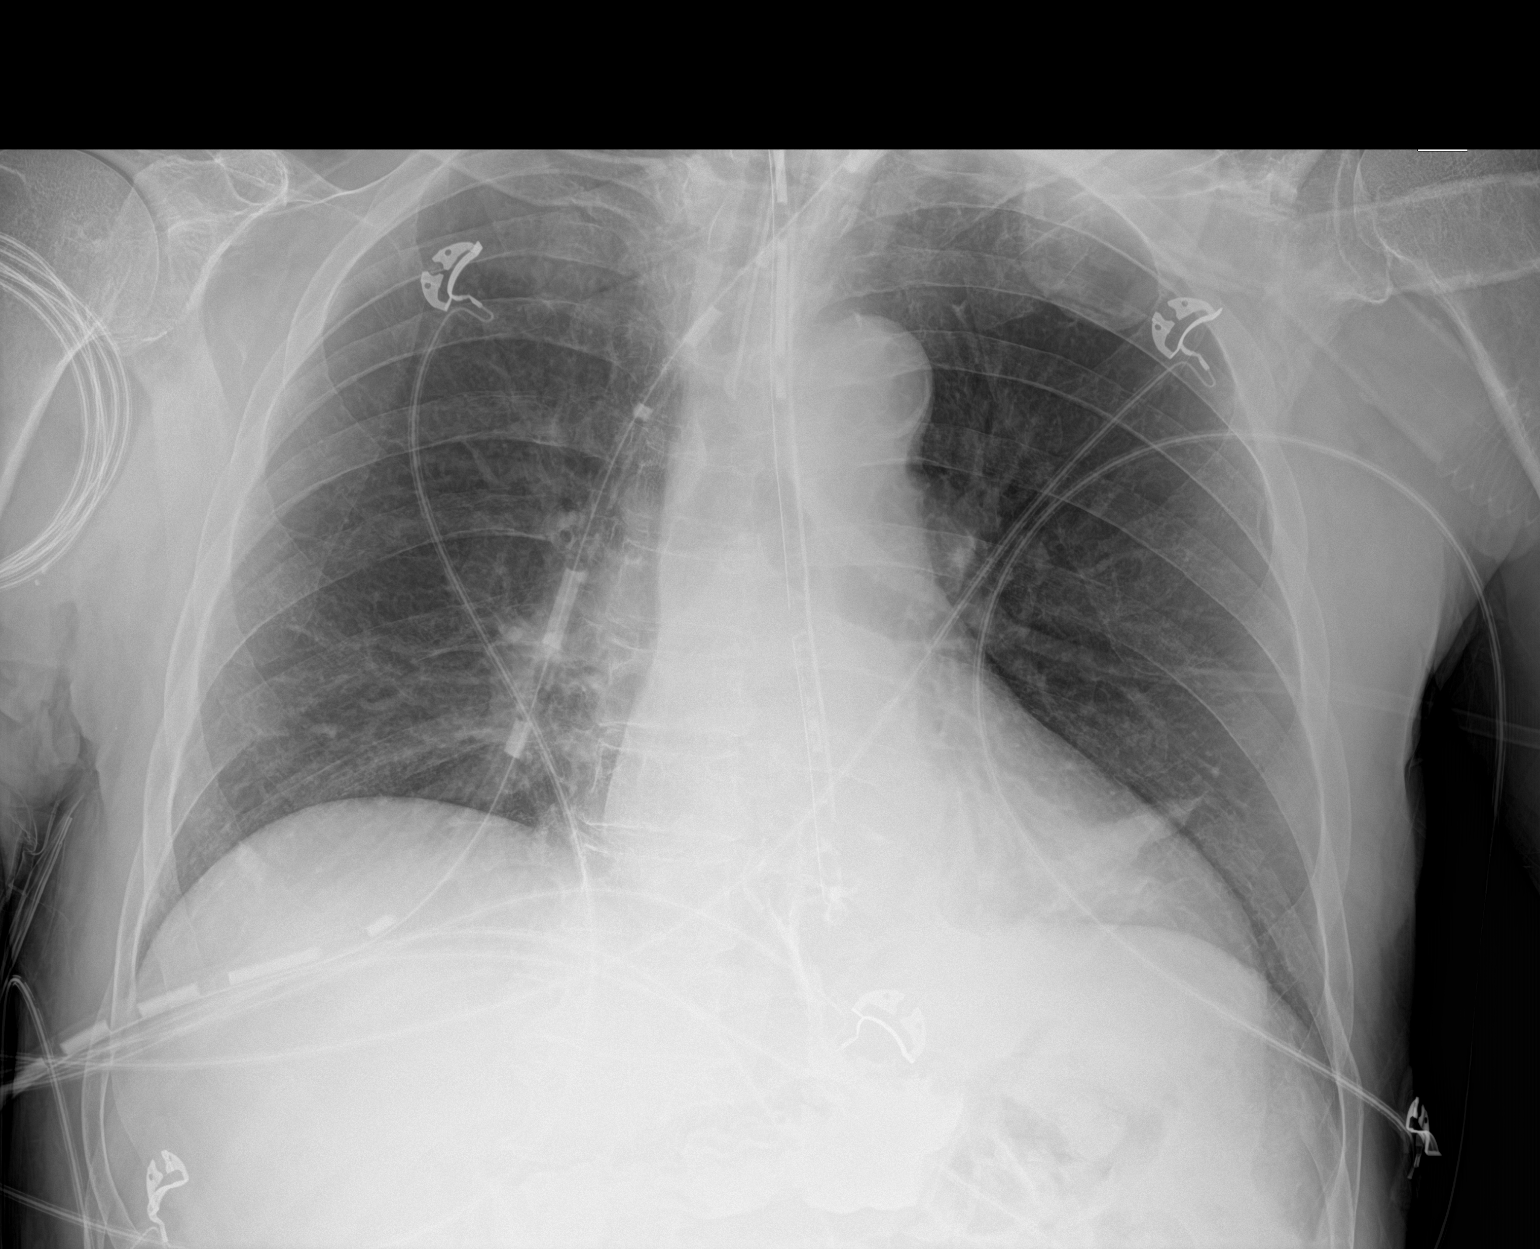

[1 of 1 positions shown; findings below may reference images not displayed]

FINDINGS: Tip of endotracheal tube projects 3.1 cm above carina.

Tip of nasogastric tube projects over medial LEFT lower chest, by
prior CT likely within previously seen hiatal hernia.

Minimal enlargement of cardiac silhouette.

Atherosclerotic calcification aorta.

Subsegmental atelectasis LEFT base.

Remaining lungs clear.

No pleural effusion or pneumothorax.
IMPRESSION: Tip of orogastric tube projects over probable hiatal hernia.

Subsegmental atelectasis LEFT lung base.

## 2019-07-17 MED ORDER — ACETAMINOPHEN 650 MG RE SUPP
650.0000 mg | RECTAL | Status: DC | PRN
  Administered 2019-07-17 – 2019-07-24 (×3): 650 mg via RECTAL
  Filled 2019-07-17 (×3): qty 1

## 2019-07-17 MED ORDER — POTASSIUM CHLORIDE 10 MEQ/100ML IV SOLN
10.0000 meq | INTRAVENOUS | Status: AC
  Administered 2019-07-17 (×4): 10 meq via INTRAVENOUS
  Filled 2019-07-17 (×4): qty 100

## 2019-07-17 MED ORDER — IOHEXOL 240 MG/ML SOLN
50.0000 mL | Freq: Once | INTRAMUSCULAR | Status: AC
  Administered 2019-07-17: 30 mL via ORAL

## 2019-07-17 NOTE — Progress Notes (Signed)
Attempted NG tube placement multiple times. MD aware that I was unable to place tube.

## 2019-07-17 NOTE — Progress Notes (Signed)
Initial Nutrition Assessment  DOCUMENTATION CODES:   Not applicable  INTERVENTION:   If unable to extubate in the next 24-48 hrs recommend placement of small bore feeding tube in fluoroscopy.   Once access is obtained:  Vital High Protein @ 55 ml/hr (1320 ml)  Provides: 1320 kcal (1541 kcal with propofol), 116 grams protein, 1104 ml free water. Kcal exceed requirement to meet protein needs per ASPEN guidelines.   NUTRITION DIAGNOSIS:   Increased nutrient needs related to acute illness as evidenced by estimated needs.  GOAL:   Patient will meet greater than or equal to 90% of their needs  MONITOR:   Diet advancement, Vent status, Skin, TF tolerance, Weight trends, Labs, I & O's  REASON FOR ASSESSMENT:   Ventilator    ASSESSMENT:   Patient with PMH significant for chronic low back pain, chronic opoid abuse, PCM, CAD, and thoracic aortic aneurysm. Presents this admission with AMS from unclear etiology and chololithiasis.   12/4- rapid response- intubated   RD working remotely.  Mental status precludes extubation. Multiple attempts to pass NGT by nursing, unsuccessful. Hypernatremia continues. Started on propofol. RD to leave TF recommendations.   Records show decline in weight from 73.5 kg on 6/22 and 69.9 kg this admission (4.9% in six months, insignificant).   Patient is currently intubated on ventilator support MV: 7.5 L/min Temp (24hrs), Avg:98.1 F (36.7 C), Min:97.5 F (36.4 C), Max:98.6 F (37 C)  Propofol: 8.39 ml/hr- provides 221 kcal from lipids daily    I/O: +230 ml since admit UOP: 1,160 ml x 24 hrs    Drips: abx, propofol  Labs: Na 146 (H) K 3.1 (L)    Diet Order:   Diet Order            Diet NPO time specified Except for: Sips with Meds  Diet effective now              EDUCATION NEEDS:   Not appropriate for education at this time  Skin:  Skin Assessment: Reviewed RN Assessment  Last BM:  PTA  Height:   Ht Readings from Last  1 Encounters:  07/16/19 5\' 7"  (1.702 m)    Weight:   Wt Readings from Last 1 Encounters:  07/14/19 69.9 kg    Ideal Body Weight:  67.3 kg  BMI:  Body mass index is 24.14 kg/m.  Estimated Nutritional Needs:   Kcal:  1467 kcal  Protein:  105-120 grams  Fluid:  >/= 1.5 L/day   Mariana Single RD, LDN Clinical Nutrition Pager # - [phone]

## 2019-07-17 NOTE — Progress Notes (Signed)
PROGRESS NOTE  ACESON LABELL WUX:[id number] DOB: 1940-11-13 DOA: 07/13/2019 PCP: Aletha Halim., PA-C  Brief History   The patient is a 78 yr old man who presented to [facility] ED on 07/13/2019 with complaints of right upper quadrant pain. He had been seen at [facility] on 07/12/2019 for the same complaints. CT of the abdomen and pelvis on 07/12/2019 demonstrated cholelithiasis, but no evidence of cholecystitis. Surgery was consulted, and they felt that this could be managed as outpatient. The patient was sent home.   He returned to [facility] ED on 06/13/2019 for increase in abdominal pain, nausea and vomiting. They patient also was very agitated upon returning. Per his wife he is acting as he did last summer when he had cholelithiasis requiring ERCP and stone retrieval. The patient apparently characterized the pain as severe at that time. It was right upper quadrant in location. No palliative or provocative factors. Right upper quadrant ultrasound was performed in the ED on 07/13/2019. It demonstrated bilary sludge and a biliary sludge ball in the gallbladder. No findings to suggest an acute cholecystitis at this time.   [facility] hospital[facility] were consulted to admit the patient for further evaluation and treatment. Overnight the patient has become more delirious. He is very agitated this morning and cannot follow commands.   Initial troponin was 17 on presentation to [facility] ED. It has increased to 684. The patient has received an aspirin and metoprolol. Cardiology has been consulted and has ordered an echocardiogram.  LFT's remain elevated. Will consider HIDA Scan, GI/General surgery consult pending result of cardiac work up and improvement in the patient's mental status.   Palliative care has been consulted. Out of concern that some of the patient's symptoms may be due to opiate withdrawal (120 mg oxycodone daily with prn hydrocodone) the patient was started on a Fentanyl patch with increased doses of prn morphine.  The patient also received   Rapid response called on am 07/16/2019 due to unresponsive with [place] respirations. Some improvement with Narcan x 2. PCCM consulted.  The patient is now intubated and sedated. All cares per PCCM. MRI brain - unremarkable. No LP planned as it would likely have low yield and may complicate any cardiac procedures that are planned.  Consultants  . Cardiology . Palliative care . PCCM  Procedures  . None  Antibiotics   Anti-infectives (From admission, onward)   Start     Dose/Rate Route Frequency Ordered Stop   07/17/19 0100  vancomycin (VANCOCIN) IVPB 750 mg/150 ml premix     750 mg 150 mL/hr over 60 Minutes Intravenous Every 12 hours 07/16/19 1240     07/16/19 1400  acyclovir (ZOVIRAX) 700 mg in dextrose 5 % 100 mL IVPB     10 mg/kg  69.9 kg 114 mL/hr over 60 Minutes Intravenous Every 8 hours 07/16/19 1240     07/16/19 1245  cefTRIAXone (ROCEPHIN) 2 g in sodium chloride 0.9 % 100 mL IVPB     2 g 200 mL/hr over 30 Minutes Intravenous Every 12 hours 07/16/19 1240     07/16/19 1245  vancomycin (VANCOCIN) 1,500 mg in sodium chloride 0.9 % 500 mL IVPB     1,500 mg 250 mL/hr over 120 Minutes Intravenous  Once 07/16/19 1240 07/16/19 1609   07/16/19 1245  ampicillin (OMNIPEN) 2 g in sodium chloride 0.9 % 100 mL IVPB     2 g 300 mL/hr over 20 Minutes Intravenous Every 4 hours 07/16/19 1240     07/13/19 1900  cefTRIAXone (ROCEPHIN) 1 g in sodium chloride 0.9 % 100 mL IVPB  Status:  Discontinued     1 g 200 mL/hr over 30 Minutes Intravenous Every 24 hours 07/13/19 1825 07/16/19 1240   07/13/19 1900  metroNIDAZOLE (FLAGYL) IVPB 500 mg     500 mg 100 mL/hr over 60 Minutes Intravenous Every 8 hours 07/13/19 1825       Subjective  The patient  Is intubated and sedated.   Objective   Vitals:  Vitals:   07/17/19 1552 07/17/19 1600  BP:  121/77  Pulse:  (!) 102  Resp:  19  Temp: (!) 101.2 F (38.4 C)   SpO2:  98%   Exam:  Constitutional:  The  patient is intubated and sedated. No acute distress.  Eyes:  . Pupils sluggish Respiratory:  . No wheezes, rales, or rhonchi . Lung sounds are diminished bilaterally . No tactile fremitus Cardiovascular:  . Regular rate and rhythm. . Tachycardic . No murmurs, ectopy, or gallups. . No lateral PMI. No thrills. Abdomen:  . Abdomen is soft, non-tender, non-distended . Abdomen is scaffoid. . No hernias, masses, or organomegaly . Hypoactive bowel sounds.  Musculoskeletal:  . No cyanosis, clubbing, or edema . Cachectic Skin:  . No rashes, lesions, ulcers . palpation of skin: no induration or nodules Neurologic:  . Unable to evaluate as the patient is unable to cooperate with exam. Psychiatric:  . Unable to evaluate as the patient is unable to cooperate with exam.  I have personally reviewed the following:   Today's Data  . Vitals, CMP, Troponins, CBC  Cardiology Data  . EKG (07/13/2019, 07/14/2019)  Other Data  . CT Abdomen, Right upper quadrant ultrasound.  Scheduled Meds: . chlorhexidine gluconate (MEDLINE KIT)  15 mL Mouth Rinse BID  . Chlorhexidine Gluconate Cloth  6 each Topical Daily  . cloNIDine  0.1 mg Transdermal Weekly  . enoxaparin (LOVENOX) injection  40 mg Subcutaneous Q24H  . iohexol  50 mL Oral Once  . mouth rinse  15 mL Mouth Rinse 10 times per day  . metoprolol tartrate  10 mg Intravenous Q6H  . naloxegol oxalate  25 mg Oral Daily  . pantoprazole (PROTONIX) IV  40 mg Intravenous Q24H   Continuous Infusions: . acyclovir Stopped (07/17/19 1458)  . ampicillin (OMNIPEN) IV Stopped (07/17/19 1522)  . cefTRIAXone (ROCEPHIN)  IV Stopped (07/17/19 0940)  . metronidazole Stopped (07/17/19 1213)  . propofol (DIPRIVAN) infusion 20 mcg/kg/min (07/17/19 1600)  . vancomycin Stopped (07/17/19 1456)    Active Problems:   Chronic low back pain   Choledocholithiasis   Acute metabolic encephalopathy   Mental status alteration   ACS (acute coronary syndrome) (HCC)    Endotracheal tube present   LOS: 4 days   A & P   NSTEMI: Troponins have elevated from 17 to 684 to 1000. EKG earlier today demonstrated septal st segment depressions.Cardiology has been consulted and is checking an echocardiogram. The patient has been given an aspirin and IV lopressor. This could possibly be the cause of the patient's "right upper quadrant" abdominal pain. Continue to monitor on telemetry. I have discussed the patient with Dr. Margaretann Loveless. Echocardiogram was performed on 07/14/2019. EF 30-35% with grossly reduced function in the left ventricle. Grade 1 diastolic dysfunction. Pt was admitted to a progressive care bed, but has now been trasferred to the ICU.  Unresponsive: Rapid response called. Fentanyl patch removed and patient received narcan x 2 with minimal, but definite improvement in responsiveness. GCS about 7.  PCCM consulted for possible transfer to ICU with narcan gtt/sirway support. The patient is now intubated and sedated.  Cholangitis/Cholecystitis ([place]?): Pt does not appear septic. No fever, no elevation of WBC, normal lactic acid, hypertention with a mild elevation of heart rate. Procalcitonin is negative. Rt upper quadrant ultrasound demonstrates no gallbladder wall thickening or pericholecystic edema. It does demonstrate sludge and a "sludge ball". No CBD dilatation is noted. LFT's are mildly elevated. The patient will need MRCP and HIDA scan, but this will have to wait until cardiac/encephalopathic issues are sorted out. He is receiving empiric rocephin and flagyl.  Delirium: Patient was agitated and confused/lethargic/unresponsive. He is now intubated and sedated. Delirium is likely due to acute illness.CT head and MRI head have been performed and do not demonstrate a cause for his change in mental status.I have consulted palliative care.  Hypertension: IV metoprolol. Monitor blood pressure and heart rate. Will increase dose of metoprolol. BP higher after  fentanyl patch removed and narcan given.   History of thoracic aortic aneurysm: Noted.   I have seen and examined this patient myself. I have spent 32 minutes in his evaluation and care. All cares are currently as per PCCM. Will discuss with them [facility] stepping back until the patient comes out to the floor again.  CODE STATUS: Full DVT Prophylaxis: Heparin gtt as per cardiology Family Communication: None as of yet. Disposition: tbd  Jenascia Bumpass, DO [facility] Direct contact: see [email or web address]  7PM-7AM contact night coverage as above 07/17/2019, 5:02 AM  LOS: 1 day

## 2019-07-17 NOTE — Progress Notes (Signed)
Awaiting results of brain MRI for further decisions regarding cardiovascular management, as anticoagulation would be ill advised if we cannot clear the brain of intracranial pathology first.   If patient is intubated and not agitated, could consider coronary angiography on Monday however discussions with family would need to be had first to ensure this is in line with goals of care. If he is unable to lie still for procedure, coronary angiography would not be recommended.   Cardiology will follow, awaiting further understanding of the nature of AMS and risk of bleeding.

## 2019-07-17 NOTE — Progress Notes (Signed)
NAME:  Riley Eaton, MRN:  [id number], DOB:  1940/12/10, LOS: 4 ADMISSION DATE:  07/13/2019, CONSULTATION DATE:  07/13/19 REFERRING MD:  Riley Eaton  CHIEF COMPLAINT:  AMS, Abd pain   Brief History   Riley Eaton is a 78 y.o. male who presented to ED 12/1 with abd pain and AMS.  Had been seen 1 day earlier and CT abd / pelv showed cholelithiasis without cholecystitis.  History of present illness   Riley Eaton is a 78 y.o. male who has a PMH including but not limited to thoracic aortic aneurysm, CAD, chronic back pain, chronic opioid use (see "past medical history" for rest).  He presented to [facility] ED 12/1 with AMS and abd pain.  He had been seen in [facility] ED 1 day prior for abd pain.  Had CT of the abd at the time that demonstrated cholelithiasis without cholecystitis.  Surgery was consulted and recommended outpatient follow up.  He was subsequently discharged home.  12/1 had worsening of pain along with AMS; therefore, came to [facility] ED.  He was quite agitated per reports and received 1mg  dilaudid, 5mg  haldol, 1mg  ativan x 3 and had minimal response.   PCCM was asked to see in consultation for consideration of precedex.  Per chart review, he has no underlying hx of EtOH abuse or substance abuse.  He does have hx of chronic low back pain and chronic opioid use.  On review of outpatient med list, he had 5-325 norco prescribed for q6hrs PRN, 40mg  oxycontin TID, lyrica 200mg  TID, duloxetine 60mg  BID.  Per chart review, he had admission 09/24/18 through 10/06/18 for acute ascending cholangitis s/p ERCP and sphincterotomy with stenting of pancreatic duct (Dr. Therisa Eaton).  During that admission, he did have severe delirium felt to be due to narcotic withdrawal.  He did spend a few days in ICU and was briefly treated with precedex and fentanyl.  He was discharged and supposed to be evaluated by surgery as an outpatient for cholecystectomy; however, this was postponed due to the [place] pandemic.  Per discharge  summary, his medication regimen was adjusted prior to d/c and pt had been tolerating it well (oxycodone weaned from 80mg  BID to BID, lyrica reduced from 200mg  TID to 100mg  TID, cymbalta from 60mg  BID to 60mg  daily).  Past Medical History  has Thoracic aortic aneurysm ([place]); Coronary artery calcification seen on CT scan; Chronic low back pain; Nausea & vomiting; Choledocholithiasis; Chronic pain; Intractable vomiting; Urinary retention; Constipation; Protein-calorie malnutrition, severe; Acute metabolic encephalopathy; Mental status alteration; and ACS (acute coronary syndrome) ([place]) on their problem list.  Significant Hospital Events   12/1 > admit. 12/4: worsening mental status and resp status unable to protect airway, tachypneic but sats are maintaining on [place] at this time. CCM reconsulted.   Consults:  12/1 PCCM. 12/2: cards  Procedures:  None.  Significant Diagnostic Tests:  CT A / P 11/30 > cholelithiasis, no cholecystitis.  Borderline splenomegaly.  Large hiatal hernia. CT head 12/3 >Significant motion limitations. No evidence of acute abnormality. Consider repeat exam based on clinical concern when patient is able to hold still. RUQ 12/1: 1. Biliary sludge and biliary sludge ball in the gallbladder. No findings to suggest an acute cholecystitis at this time. Echo 12/2: LVEF [id number], Grade I diastolic dysfunction    Micro Data:  Blood 12/1 > ngtd SARS CoV2 12/1 > NEGATIVE  Antimicrobials:  Ceftriaxone 12/1 >  Flagyl 12/1 >   Interim history/subjective:  12/4: reconsulted for worsening encephalopathy.  Pt unresponsive this am with periods of apnea. Pt received fentanyl patch overnight.  Was given narcan without great improvement and now tachypneic, unable to protect airway and unresponsive.  12/1: Somnolent but opens eyes to noxious stimuli.  Objective:  Blood pressure (!) 146/92, pulse (!) 108, temperature 98.6 F (37 C), temperature source Oral, resp. rate (!) 22, height  5\' 7"  (1.702 m), weight 69.9 kg, SpO2 99 %.    Vent Mode: PRVC FiO2 (%):  [40 %-100 %] 40 % Set Rate:  [16 bmp] 16 bmp Vt Set:  [530 mL] 530 mL PEEP:  [5 cmH20] 5 cmH20 Plateau Pressure:  [12 cmH20-15 cmH20] 15 cmH20   Intake/Output Summary (Last 24 hours) at 07/17/2019 [id number] Last data filed at 07/17/2019 0800 Gross per 24 hour  Intake 2788.34 ml  Output 1285 ml  Net 1503.34 ml   Filed Weights   07/14/19 2046  Weight: 69.9 kg    Examination: General: Elderly male, appears acutely ill in bed, unresponsive but spontaneously moving in bed. tachypneic unable to protect airway Neuro: spontaneously moving in bed but sonorous breath sounds HEENT: [place]/AT. Sclerae anicteric. PERRL. Cardiovascular: RRhythm, tachycardic, no M/R/G.  Lungs: Respirations CTA bilaterally, No W/R/R. Abdomen: BS x 4, soft, NT/ND.  Musculoskeletal: No gross deformities, no edema.  Skin: Intact, warm, no rashes. Neuro: CN II-VI intact, pt tracks midline, PERRL. Pt w/d to noxious stim, including all 4 extrem with attempts to place NGT. Does not respond to voice, or commands  Assessment & Plan:    Acute encephalopathy: - CTH negative -check ammonia, tsh for completeness sake -lactate and pct normal so less likely sepsis playing role -previous h/o opioid withdraw now somnolent with fentanyl patch, after d/w family there has been no missed dosing of opioid at home and prior to presentation on Monday (when he was sent home from [facility]) he was already having some confusion. Unlikely to be opioid withdraw.  -not protecting airway.  -after intubation would be beneficial to get MRI and LP to r/o CNS with persistent encephalopathy without clear etiology. I would defer LP awaiting any potential intervention cardiology would want to entertain  Acute resp failure:  -intubated for airway protection; SBT this morning but mental status precludes extubation -titrate vent -rass goal 0  Abd pain - unclear etiology; though,  concerning for cholecystitis.  Had ERCP with sphincterotomy and pancreatic duct stenting on 09/25/18. - Empiric ceftriaxone and flagyl for now. - MRCP +/- HIDA pending  Prolonged qt:  -avoid prolonging agents -recheck prn Elevated trop/NSTEMI:  -up to >2K -per primary and cardiology; cardiology pending. 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 Pain/Anxiety/Delirium protocol (if indicated): Fentanyl low dose scheduled as above.   VAP protocol (if indicated): N/A. DVT prophylaxis: Per primary. GI prophylaxis: Per primary. Glucose control: Per primary. Mobility: BR. Code Status: Full. Family Communication: wife via phone. 12/4 Disposition: ICU  Labs   CBC: Recent Labs  Lab 07/12/19 1320 07/13/19 1440 07/13/19 1530 07/14/19 0757 07/15/19 0319 07/16/19 0409 07/16/19 1357  WBC 7.5 7.0  --  8.1 12.6* 10.4  --   NEUTROABS 6.5 5.4  --   --  10.7* 8.6*  --   HGB 13.4 13.7 12.2* 13.2 14.5 14.7 11.2*  HCT 37.2* 38.2* 36.0* 36.9* 39.2 40.1 33.0*  MCV 93.5 93.9  --  94.1 91.8 92.4  --  PLT 111* 140*  --  105* 138* 157  --    Basic Metabolic Panel: Recent Labs  Lab 07/13/19 1440 07/13/19 1530 07/14/19 0757 07/15/19 0319 07/15/19 1220 07/15/19 1438 07/16/19 0409 07/16/19 1357  NA 142 143 144 143  --  147* 146* 150*  K 3.2* 3.2* 3.2* 3.0*  --  4.0 3.1* 3.0*  CL 108 111 110 110  --  111 114*  --   CO2 18*  --  22 19*  --  22 21*  --   GLUCOSE 168* 158* 144* 127*  --  137* 131*  --   BUN 28* 31* 23 25*  --  33* 42*  --   CREATININE 1.01 0.70 0.74 0.69  --  0.78 0.82  --   CALCIUM 10.0  --  9.3 9.3  --  9.7 9.4  --   MG  --   --   --   --  2.2  --   --   --    GFR: Estimated Creatinine Clearance: 68.3 mL/min (by C-G formula based on SCr of 0.82 mg/dL). Recent Labs  Lab 07/13/19 1440 07/13/19 1500 07/13/19 1845 07/14/19 0757 07/15/19 0319  07/15/19 1809 07/15/19 2113 07/16/19 0409  PROCALCITON  --   --   --  <0.10  --   --   --   --   WBC 7.0  --   --  8.1 12.6*  --   --  10.4  LATICACIDVEN  --  3.4* 1.2  --   --  1.3 1.0  --    Liver Function Tests: Recent Labs  Lab 07/12/19 1320 07/13/19 1440 07/14/19 0757 07/15/19 0319 07/16/19 0409  AST 27 42* 54*   55* 74* 62*  ALT 16 21 29   28  39 41  ALKPHOS 90 93 91   87 92 90  BILITOT 1.4* 2.1* 1.7*   1.5* 1.9* 1.4*  PROT 7.1 7.6 7.1   7.0 6.9 6.8  ALBUMIN 4.2 4.5 4.2   4.2 4.2 4.0   Recent Labs  Lab 07/12/19 1320 07/13/19 1440  LIPASE 17 18   Recent Labs  Lab 07/16/19 1130  AMMONIA 37*   ABG    Component Value Date/Time   PHART 7.408 07/16/2019 1357   PCO2ART 31.9 (L) 07/16/2019 1357   PO2ART 294.0 (H) 07/16/2019 1357   HCO3 20.3 07/16/2019 1357   TCO2 21 (L) 07/16/2019 1357   ACIDBASEDEF 4.0 (H) 07/16/2019 1357   O2SAT 100.0 07/16/2019 1357    Coagulation Profile: No results for input(s): INR, PROTIME in the last 168 hours. Cardiac Enzymes: No results for input(s): CKTOTAL, CKMB, CKMBINDEX, TROPONINI in the last 168 hours. HbA1C: No results found for: HGBA1C CBG: Recent Labs  Lab 07/16/19 1028 07/16/19 1215 07/16/19 1612 07/16/19 1959  GLUCAP 124* 121* 125* 106*      Critical care time: The patient is critically ill with multiple organ systems failure and requires high complexity decision making for assessment and support, frequent evaluation and titration of therapies, application of advanced monitoring technologies and extensive interpretation of multiple databases.  Critical care time 38 mins. This represents my time independent of the NPs time taking care of the pt. This is excluding procedures.   Bonna Gains MD, PhD [facility] 07/17/2019, 9:28 AM

## 2019-07-17 NOTE — Progress Notes (Signed)
Updated daughter, who is at bedside.  AMS, but nl-appearing MRI brain (still awaiting rad read); pt  afebrile, no nuchal rigidity, nominal WBC w/o left shift leaves meningitis possible but unlikely. Explained could LP to be sure, but probable low yield at present. She does not want to proceed with LP.  Also discussed placement of OG/NG for feeding, medicines. Multiple attempts have either been obstructed or appear to be at thoracic level, question hiatal hernia impeding placement. Will ask IR to place under fluoro. Daughter in agreement.   Bonna Gains, MD PhD 07/17/19 3:08 PM

## 2019-07-18 ENCOUNTER — Inpatient Hospital Stay (HOSPITAL_COMMUNITY): Payer: Medicare Other

## 2019-07-18 DIAGNOSIS — G9340 Encephalopathy, unspecified: Secondary | ICD-10-CM

## 2019-07-18 DIAGNOSIS — I214 Non-ST elevation (NSTEMI) myocardial infarction: Secondary | ICD-10-CM

## 2019-07-18 LAB — GLUCOSE, CAPILLARY
Glucose-Capillary: 110 mg/dL — ABNORMAL HIGH (ref 70–99)
Glucose-Capillary: 142 mg/dL — ABNORMAL HIGH (ref 70–99)
Glucose-Capillary: 121 mg/dL — ABNORMAL HIGH (ref 70–99)

## 2019-07-18 LAB — CBC WITH DIFFERENTIAL/PLATELET
Abs Immature Granulocytes: 0.08 10*3/uL — ABNORMAL HIGH (ref 0.00–0.07)
Basophils Absolute: 0 10*3/uL (ref 0.0–0.1)
Basophils Relative: 0 %
Eosinophils Absolute: 0 10*3/uL (ref 0.0–0.5)
Eosinophils Relative: 0 %
HCT: 36 % — ABNORMAL LOW (ref 39.0–52.0)
Hemoglobin: 12.5 g/dL — ABNORMAL LOW (ref 13.0–17.0)
Immature Granulocytes: 1 %
Lymphocytes Relative: 13 %
Lymphs Abs: 0.9 10*3/uL (ref 0.7–4.0)
MCH: 33.9 pg (ref 26.0–34.0)
MCHC: 34.7 g/dL (ref 30.0–36.0)
MCV: 97.6 fL (ref 80.0–100.0)
Monocytes Absolute: 0.6 10*3/uL (ref 0.1–1.0)
Monocytes Relative: 8 %
Neutro Abs: 5.5 10*3/uL (ref 1.7–7.7)
Neutrophils Relative %: 78 %
Platelets: 122 10*3/uL — ABNORMAL LOW (ref 150–400)
RBC: 3.69 MIL/uL — ABNORMAL LOW (ref 4.22–5.81)
RDW: 14.3 % (ref 11.5–15.5)
WBC: 7.1 10*3/uL (ref 4.0–10.5)
nRBC: 0 % (ref 0.0–0.2)

## 2019-07-18 LAB — CULTURE, BLOOD (ROUTINE X 2)
Culture: NO GROWTH
Special Requests: ADEQUATE

## 2019-07-18 LAB — TROPONIN I (HIGH SENSITIVITY): Troponin I (High Sensitivity): 289 ng/L (ref <18)

## 2019-07-18 LAB — COMPREHENSIVE METABOLIC PANEL
ALT: 30 U/L (ref 0–44)
AST: 29 U/L (ref 15–41)
Albumin: 3.1 g/dL — ABNORMAL LOW (ref 3.5–5.0)
Alkaline Phosphatase: 65 U/L (ref 38–126)
Anion gap: 10 (ref 5–15)
BUN: 29 mg/dL — ABNORMAL HIGH (ref 8–23)
CO2: 22 mmol/L (ref 22–32)
Calcium: 8.3 mg/dL — ABNORMAL LOW (ref 8.9–10.3)
Chloride: 116 mmol/L — ABNORMAL HIGH (ref 98–111)
Creatinine, Ser: 0.79 mg/dL (ref 0.61–1.24)
GFR calc Af Amer: >60 mL/min (ref >60)
GFR calc non Af Amer: >60 mL/min (ref >60)
Glucose, Bld: 120 mg/dL — ABNORMAL HIGH (ref 70–99)
Potassium: 3.3 mmol/L — ABNORMAL LOW (ref 3.5–5.1)
Sodium: 148 mmol/L — ABNORMAL HIGH (ref 135–145)
Total Bilirubin: 0.9 mg/dL (ref 0.3–1.2)
Total Protein: 5.9 g/dL — ABNORMAL LOW (ref 6.5–8.1)

## 2019-07-18 LAB — TRIGLYCERIDES: Triglycerides: 82 mg/dL (ref <150)

## 2019-07-18 LAB — AMMONIA: Ammonia: 40 umol/L — ABNORMAL HIGH (ref 9–35)

## 2019-07-18 IMAGING — CT CT ABD-PELV W/ CM
2 of 6 series · 14 of 46 positions shown, 16 images · IV contrast (APPLIED)
Comparison: CT abdomen pelvis dated [DATE].

CLINICAL DATA: Seven 8-year-old male with abdominal pain and
intermittent fevers. Gallstone.

EXAM:
CT ABDOMEN AND PELVIS WITH CONTRAST
TECHNIQUE: Multidetector CT imaging of the abdomen and pelvis was performed
using the standard protocol following bolus administration of
intravenous contrast.
CONTRAST:  100mL OMNIPAQUE IOHEXOL 300 MG/ML  SOLN

[Series 3: abdomen 5.0 · axial · 0.73mm/px · z∈[-424,-40]mm · 11 of 93 slices shown, 13 images]
[im 8/93  soft-tissue]
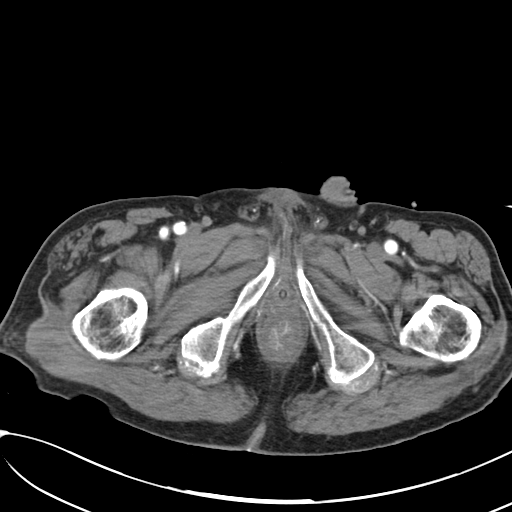
[im 8/93  bone]
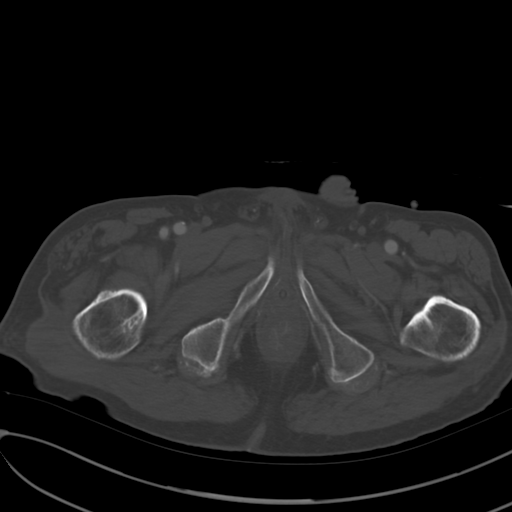
[im 15/93  soft-tissue]
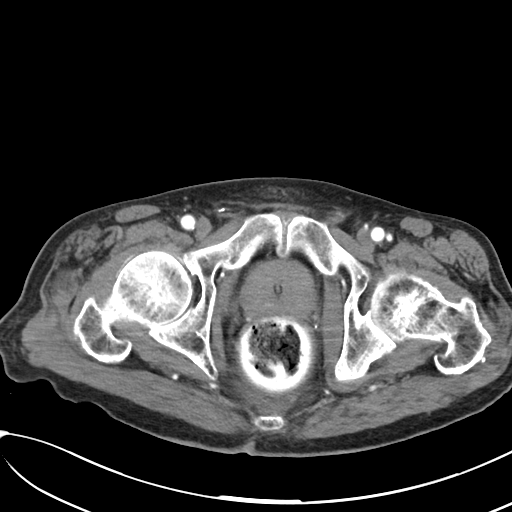
[im 22/93  soft-tissue]
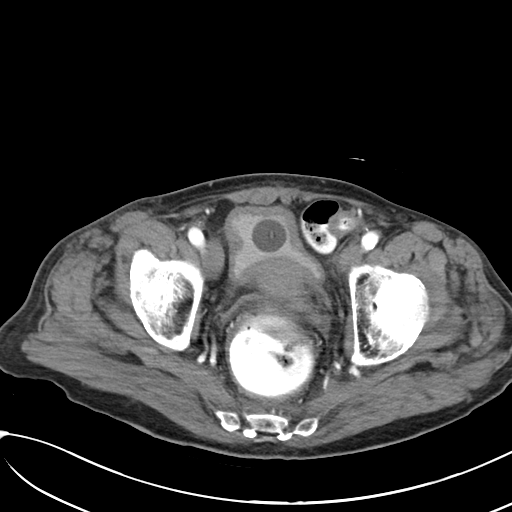
[im 29/93  soft-tissue]
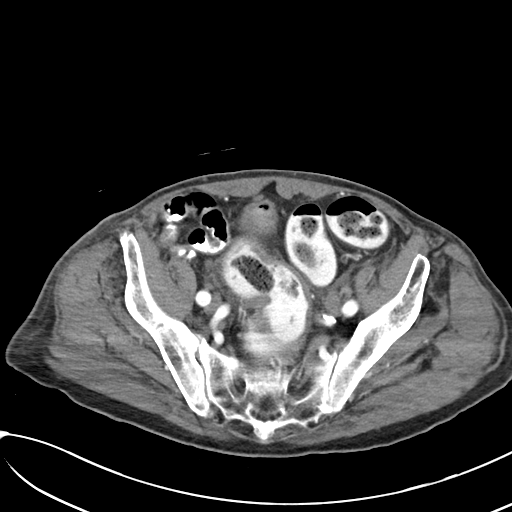
[im 36/93  soft-tissue]
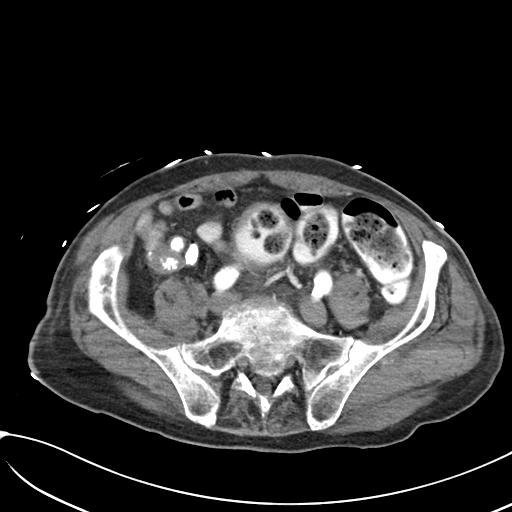
[im 50/93  soft-tissue]
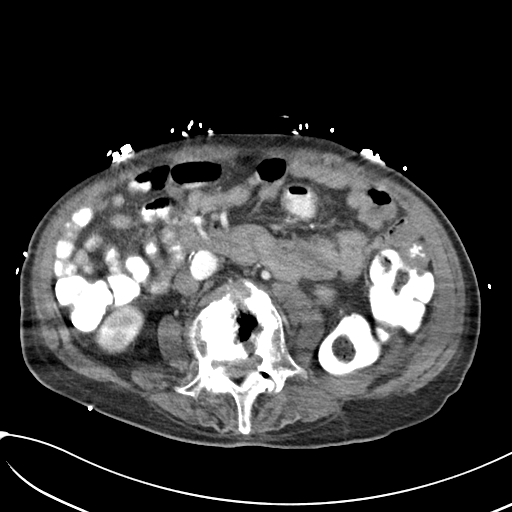
[im 57/93  soft-tissue]
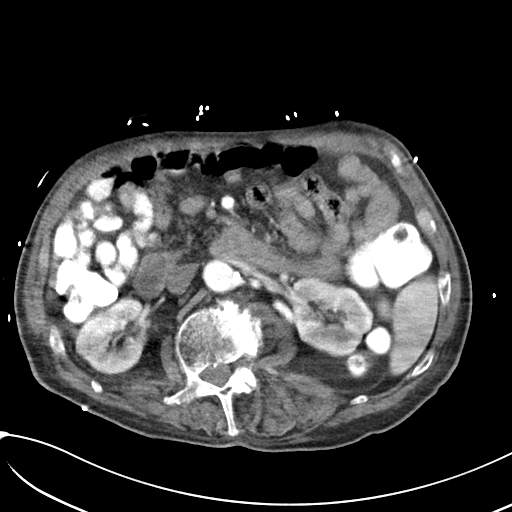
[im 64/93  soft-tissue]
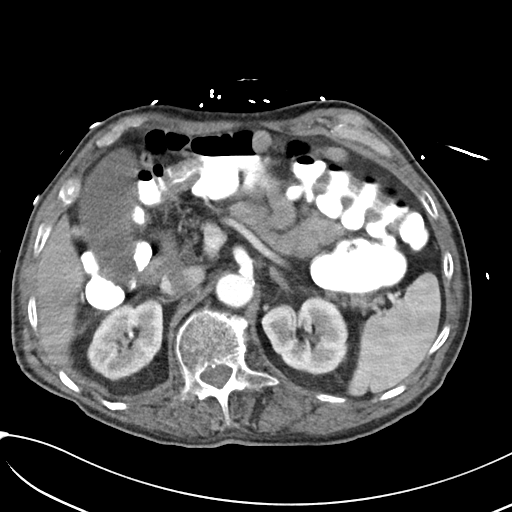
[im 71/93  soft-tissue]
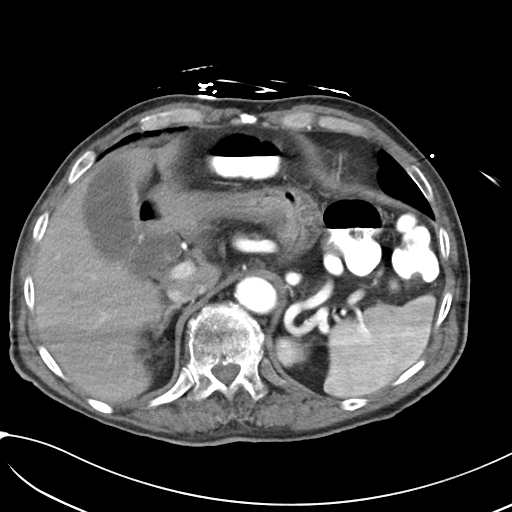
[im 71/93  bone]
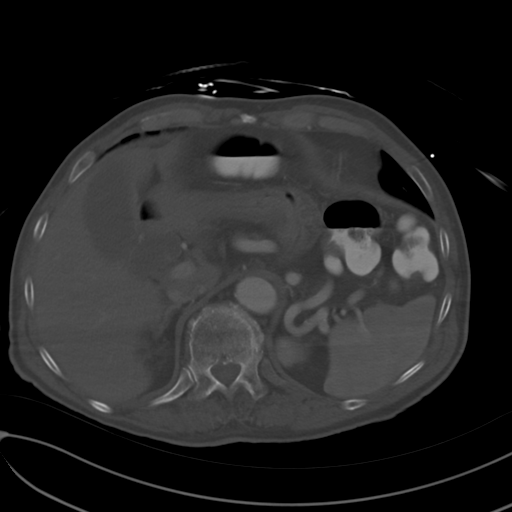
[im 78/93  soft-tissue]
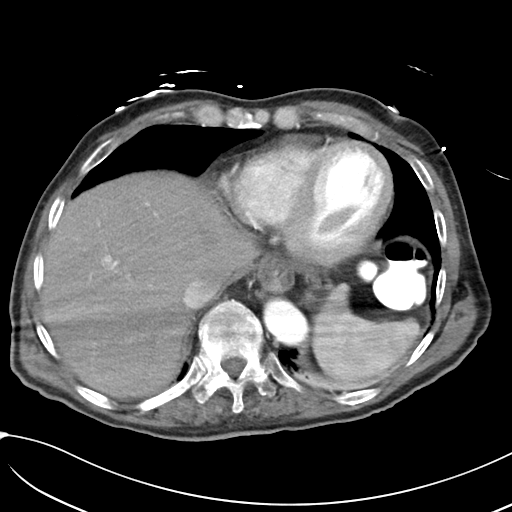
[im 85/93  soft-tissue]
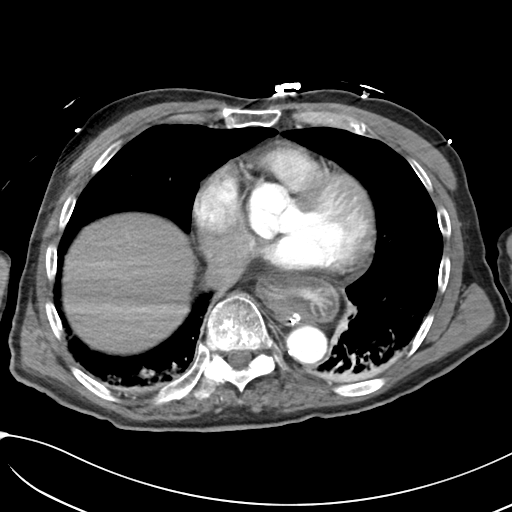

[Series 6: abdomen 3.0 mpr cor · coronal · 0.68mm/px · 3 of 101 slices shown]
[im 34/101  soft-tissue]
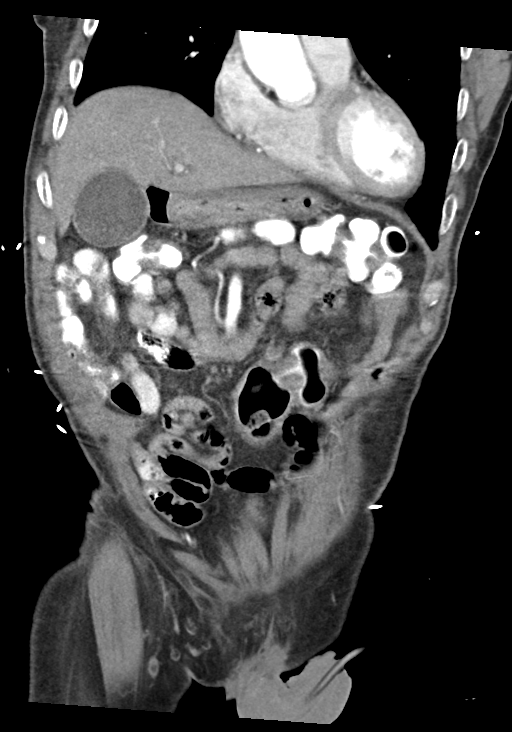
[im 45/101  soft-tissue]
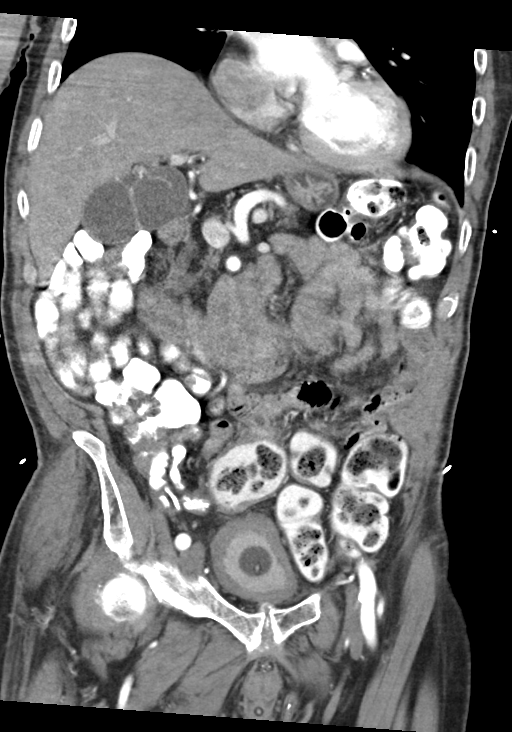
[im 56/101  soft-tissue]
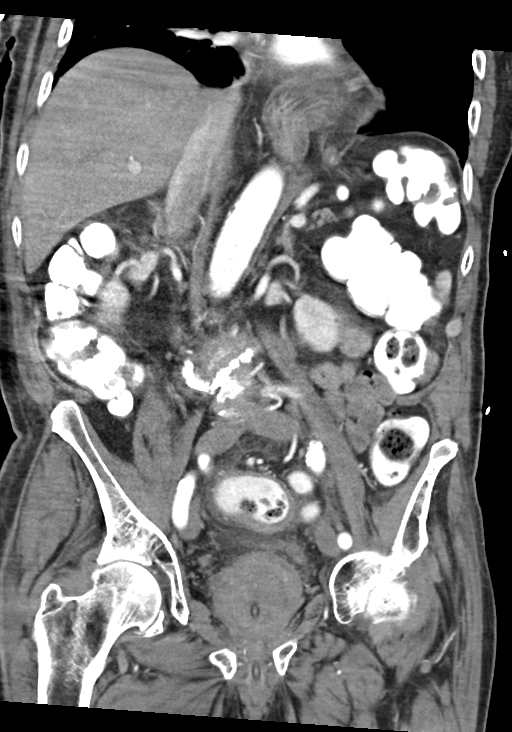

[14 of 46 positions shown; findings below may reference images not displayed]

FINDINGS: Evaluation is limited due to streak artifact caused by patient's
arms as well as due to respiratory motion artifact.

Lower chest: Minimal bibasilar dependent atelectatic changes.
Bilateral lower lobe streaky densities may represent atelectasis
although developing infiltrate is not excluded. Clinical correlation
is recommended. Multi vessel coronary vascular calcification
primarily involving the LAD and left circumflex artery as well as
involving the left main.

No intra-abdominal free air or free fluid.

Hepatobiliary: The liver is unremarkable. No intrahepatic biliary
ductal dilatation. Small stones noted within the gallbladder. The
gallbladder is mildly distended. No pericholecystic fluid or
evidence of acute cholecystitis by CT. No calcified stone noted in
the central CBD.

Pancreas: There is fatty infiltration of the pancreas. No active
inflammatory changes

Spleen: Mild splenomegaly measuring 15 cm in craniocaudal length.

Adrenals/Urinary Tract: The adrenal glands are unremarkable. There
is no hydronephrosis on either side. There is symmetric enhancement
and excretion of contrast by both kidneys. The visualized ureters
appear unremarkable. The urinary bladder is predominantly
decompressed around a Foley catheter. Minimal distension of the
urinary bladder with excreted contrast. Mild trabeculated appearance
of the bladder wall, likely related to chronic bladder outlet
obstruction.

Stomach/Bowel: Partially visualized enteric tube with tip in the
distal esophagus. There is a moderate size hiatal hernia containing
a portion of the stomach. There is mild thickened appearance of the
wall the stomach which may be related to underdistention. Mild
gastritis is not excluded. Clinical correlation is recommended.
There is no bowel obstruction. The appendix is normal.

Vascular/Lymphatic: Mild aortoiliac atherosclerotic disease. The
aorta is tortuous. No aneurysmal dilatation or dissection. The IVC
is grossly unremarkable. No portal venous gas. There is no
adenopathy.

Reproductive: Enlarged prostate gland measuring approximately 6 cm
in transverse axial diameter. The seminal vesicles are symmetric.

Other: None

Musculoskeletal: Osteopenia with scoliosis and degenerative changes
of the spine. No acute osseous pathology.
IMPRESSION: 1. Moderate size hiatal hernia containing a portion of the stomach.
Mild thickened appearance of the wall of the stomach may be related
to underdistention. Mild gastritis is not excluded. Clinical
correlation is recommended. No bowel obstruction. Normal appendix.
2. Enteric tube with tip in the distal esophagus close to the GE
junction within the hiatal hernia.
3. Cholelithiasis.
4. Mild splenomegaly.
5. Aortic Atherosclerosis ([WF]-[WF]).

## 2019-07-18 MED ORDER — VITAL HIGH PROTEIN PO LIQD
1000.0000 mL | ORAL | Status: DC
  Administered 2019-07-18: 1000 mL

## 2019-07-18 MED ORDER — POTASSIUM CHLORIDE 10 MEQ/100ML IV SOLN
10.0000 meq | INTRAVENOUS | Status: AC
  Administered 2019-07-18 (×3): 10 meq via INTRAVENOUS
  Filled 2019-07-18 (×3): qty 100

## 2019-07-18 MED ORDER — POTASSIUM CHLORIDE 20 MEQ/15ML (10%) PO SOLN
30.0000 meq | ORAL | Status: AC
  Administered 2019-07-18 (×2): 30 meq
  Filled 2019-07-18 (×2): qty 30

## 2019-07-18 MED ORDER — IOHEXOL 300 MG/ML  SOLN
100.0000 mL | Freq: Once | INTRAMUSCULAR | Status: AC | PRN
  Administered 2019-07-18: 100 mL via INTRAVENOUS

## 2019-07-18 NOTE — Progress Notes (Addendum)
Progress Note  Patient Name: Riley Eaton Date of Encounter: 07/18/2019  Primary Cardiologist: Quay Burow, MD   Subjective   Patient responds to name and conversation, denies pain (shakes head no)  Inpatient Medications    Scheduled Meds:  chlorhexidine gluconate (MEDLINE KIT)  15 mL Mouth Rinse BID   Chlorhexidine Gluconate Cloth  6 each Topical Daily   cloNIDine  0.1 mg Transdermal Weekly   enoxaparin (LOVENOX) injection  40 mg Subcutaneous Q24H   mouth rinse  15 mL Mouth Rinse 10 times per day   metoprolol tartrate  10 mg Intravenous Q6H   naloxegol oxalate  25 mg Oral Daily   pantoprazole (PROTONIX) IV  40 mg Intravenous Q24H   potassium chloride  30 mEq Per Tube Q4H   Continuous Infusions:  acyclovir Stopped (07/18/19 0744)   ampicillin (OMNIPEN) IV Stopped (07/18/19 0807)   cefTRIAXone (ROCEPHIN)  IV Stopped (07/17/19 2238)   metronidazole Stopped (07/18/19 0459)   potassium chloride 10 mEq (07/18/19 0917)   propofol (DIPRIVAN) infusion Stopped (07/18/19 0748)   vancomycin Stopped (07/18/19 0349)   PRN Meds: acetaminophen, docusate, fentaNYL (SUBLIMAZE) injection, fentaNYL (SUBLIMAZE) injection, haloperidol lactate, hydrALAZINE, ketorolac, LORazepam, morphine injection, naLOXone (NARCAN)  injection   Vital Signs    Vitals:   07/18/19 0745 07/18/19 0800 07/18/19 0900 07/18/19 0905  BP: (!) 153/103 (!) 155/90 (!) 172/120 (!) 162/105  Pulse: 85 93 95 94  Resp: (!) 25 (!) 30 (!) 26 (!) 22  Temp: (!) 101.6 F (38.7 C)   (!) 100.7 F (38.2 C)  TempSrc: Axillary   Axillary  SpO2: 100% 99% 99% 97%  Weight:      Height:        Intake/Output Summary (Last 24 hours) at 07/18/2019 0953 Last data filed at 07/18/2019 0900 Gross per 24 hour  Intake 2173.38 ml  Output 1385 ml  Net 788.38 ml   Last 3 Weights 07/14/2019 02/01/2019 09/25/2018  Weight (lbs) 154 lb 1.6 oz 162 lb 158 lb  Weight (kg) 69.9 kg 73.483 kg 71.668 kg      Telemetry      Sinus rhythm- Personally Reviewed  ECG    07/16/2019 ST/T wave abnormalities anterolateral, inferior- Personally Reviewed  Physical Exam   GEN: Responds to name and conversation Neck: No JVD Cardiac: RRR, no murmurs, rubs, or gallops.  Respiratory: coarse breath sounds bilaterally GI: Soft, nontender, non-distended  MS: No edema; No deformity. Neuro:  moves arms and turns head to voice, shakes head yes and no Psych: not able to assess   Labs    High Sensitivity Troponin:   Recent Labs  Lab 07/14/19 1409 07/15/19 0841 07/15/19 1220 07/16/19 1251 07/18/19 0240  TROPONINIHS 974* 2,079* 2,110* 1,422* 289*      Chemistry Recent Labs  Lab 07/15/19 0319 07/15/19 1438 07/16/19 0409 07/16/19 1357 07/18/19 0240  NA 143 147* 146* 150* 148*  K 3.0* 4.0 3.1* 3.0* 3.3*  CL 110 111 114*  --  116*  CO2 19* 22 21*  --  22  GLUCOSE 127* 137* 131*  --  120*  BUN 25* 33* 42*  --  29*  CREATININE 0.69 0.78 0.82  --  0.79  CALCIUM 9.3 9.7 9.4  --  8.3*  PROT 6.9  --  6.8  --  5.9*  ALBUMIN 4.2  --  4.0  --  3.1*  AST 74*  --  62*  --  29  ALT 39  --  41  --  30  ALKPHOS 92  --  90  --  65  BILITOT 1.9*  --  1.4*  --  0.9  GFRNONAA >60 >60 >60  --  >60  GFRAA >60 >60 >60  --  >60  ANIONGAP 14 14 11   --  10     Hematology Recent Labs  Lab 07/16/19 0409 07/16/19 1357 07/17/19 1632 07/18/19 0240  WBC 10.4  --  6.2 7.1  RBC 4.34  --  3.47* 3.69*  HGB 14.7 11.2* 11.9* 12.5*  HCT 40.1 33.0* 33.6* 36.0*  MCV 92.4  --  96.8 97.6  MCH 33.9  --  34.3* 33.9  MCHC 36.7*  --  35.4 34.7  RDW 13.5  --  14.4 14.3  PLT 157  --  113* 122*    BNPNo results for input(s): BNP, PROBNP in the last 168 hours.   DDimer No results for input(s): DDIMER in the last 168 hours.   Radiology    Mr Brain Wo Contrast  Result Date: 07/17/2019 CLINICAL DATA:  78 year old male with recent unexplained altered mental status. Unresponsive this morning with periods of apnea. EXAM: MRI HEAD  WITHOUT CONTRAST TECHNIQUE: Multiplanar, multiecho pulse sequences of the brain and surrounding structures were obtained without intravenous contrast. COMPARISON:  Head CT without contrast 08/11/2019. FINDINGS: Brain: No restricted diffusion to suggest acute infarction. No midline shift, mass effect, evidence of mass lesion, ventriculomegaly, extra-axial collection or acute intracranial hemorrhage. Cervicomedullary junction and pituitary are within normal limits. Cerebral volume is within normal limits for age. No cortical encephalomalacia or chronic cerebral blood products identified. 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 Other mild T2 heterogeneity in the deep gray nuclei is nonspecific, and not evident on DWI ir T1 weighted imaging. Negative brainstem and cerebellum. Minimal for age nonspecific cerebral white matter T2 and FLAIR hyperintensity. Vascular: Loss of the distal right vertebral artery flow void on series 10, image 1. Other Major intracranial vascular flow voids are preserved. Skull and upper cervical spine: Negative visible cervical spine. Visualized bone marrow signal is within normal limits. Sinuses/Orbits: Negative orbits. Trace paranasal sinus mucosal thickening. Other: Intubated. Small volume retained fluid in the pharynx. Trace mastoid fluid. Visible internal auditory structures appear normal. Small suboccipital benign scalp lipoma. IMPRESSION: 1.  No acute intracranial abnormality identified. 2. Mild nonspecific T2 and FLAIR hyperintensity in the deep gray nuclei, with a tiny chronic lacune in the left caudate. 3. Otherwise negative brain for age. Electronically Signed   By: Genevie Ann M.D.   On: 07/17/2019 21:24   Dg Chest Port 1 View  Result Date: 07/17/2019 CLINICAL DATA:  Orogastric tube placement EXAM: PORTABLE CHEST 1 VIEW COMPARISON:  Portable exam 9381 hours compared to 07/16/2019 Correlation: CT abdomen and pelvis 07/12/2019 FINDINGS: Tip of endotracheal tube projects 3.1  cm above carina. Tip of nasogastric tube projects over medial LEFT lower chest, by prior CT likely within previously seen hiatal hernia. Minimal enlargement of cardiac silhouette. Atherosclerotic calcification aorta. Subsegmental atelectasis LEFT base. Remaining lungs clear. No pleural effusion or pneumothorax. IMPRESSION: Tip of orogastric tube projects over probable hiatal hernia. Subsegmental atelectasis LEFT lung base. Electronically Signed   By: Lavonia Dana M.D.   On: 07/17/2019 17:43   Portable Chest X-ray  Result Date: 07/16/2019 CLINICAL DATA:  Endotracheal tube. EXAM: PORTABLE CHEST 1 VIEW COMPARISON:  09/24/2018. FINDINGS: Endotracheal tube terminates 7.4 cm above the carina. Nasogastric tube is coiled within a hiatal hernia. Heart size stable. Lungs are clear. No pleural fluid.  IMPRESSION: 1. Endotracheal tube terminates 7.4 cm above the carina. 2. Nasogastric tube is coiled in a hiatal hernia. 3. No acute pulmonary parenchymal findings. Electronically Signed   By: Lorin Picket M.D.   On: 07/16/2019 12:47   Dg Abd Portable 1v  Result Date: 07/16/2019 CLINICAL DATA:  OG tube placement. EXAM: PORTABLE ABDOMEN - 1 VIEW COMPARISON:  07/13/2019 FINDINGS: Single abdominal radiograph excluding part of the pelvis shows an OG tube with the tip just below the left hemidiaphragm in terms of projection. The patient has a large hiatal hernia with inverted stomach, extending into the chest. Therefore it is difficult to determine whether this tube is at the EG junction or beyond. Injection of a small amount of water-soluble contrast with follow-up radiograph may be helpful to determine placement. Spinal degenerative changes are present. No signs of acute bone finding. Marked dextro convexity of the spine. IMPRESSION: 1. OG tube likely within intrathoracic stomach. Exact side port and tip location difficult to determine given gastric position. Injection of a small amount of water-soluble contrast with  follow-up radiograph could potentially be of benefit. Electronically Signed   By: Zetta Bills M.D.   On: 07/16/2019 12:52    Cardiac Studies  echo  1. Technically difficult study. Left ventricular ejection fraction, by visual estimation, is 30 to 35%. The left ventricle has grossly severely decreased function. Images are not sufficient to assess for regional wall motion abnormalities. There is  mildly increased left ventricular hypertrophy.  2. Left ventricular diastolic parameters are consistent with Grade I diastolic dysfunction (impaired relaxation).  3. Global right ventricle has normal systolic function.The right ventricular size is normal.  4. The mitral valve is normal in structure. No evidence of mitral valve regurgitation.  5. The tricuspid valve is not well visualized. Tricuspid valve regurgitation is not demonstrated.  6. The aortic valve was not well visualized. Aortic valve regurgitation is not visualized.  7. The pulmonic valve was not well visualized. Pulmonic valve regurgitation is not visualized.   Patient Profile     78 y.o. 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   Assessment & Plan    NSTEMI-the patient has been altered or sedated for the duration of his hospital stay therefore discussion of chest pain has been challenging.  I asked the patient today if he is having any pain and he shakes his head no.  His troponin is elevated and he has significant T wave inversions.  This is concerning for ACS.  He also has a newly reduced ejection fraction.  He appears less agitated today and I did describe for him the plan with regard to eventual cardiac catheterization.  I reviewed the CT chest non-ECG gated study from February 2020 which demonstrates distal left main, proximal LAD,  and proximal circumflex coronary artery calcifications.  There also appears to be distal RCA coronary calcifications.  I am concerned that this represents proximal left-sided coronary artery disease, which may be significant in light of his presentation currently.  Unfortunately his primary presenting problem has not yet been clarified.  He had intermittent fevers over the past 24 hours, and I am concerned that there may be an infectious process.  I have spoken to Dr. Maryjean Ka today and participated in multidisciplinary approach to care.  He will plan to image the patient's abdomen  looking for a source of fevers.  I have shared with him my concern of initiating dual antiplatelet therapy after PCI and subsequently the patient requiring intra-abdominal surgery.  Stopping dual antiplatelet therapy would be very challenging in the setting.  A strategy of medical management is ideal.  We could consider heparin if there is no source of bleeding identified.  The patient has been receiving metoprolol 10 mg IV every 6 for hypertension.  If the patient does not develop true sepsis in the next 24 to 48 hours, it would be reasonable to start initiating medical management of coronary artery disease.  This could include low-dose oral metoprolol.  One of the challenges in pursuing coronary angiography has been the patient's mental status and ability to lie flat without agitation.  The other is that he has not been cleared until yesterday of an intracranial process that would prohibit the use of heparin.  This is a very challenging situation.  Cardiology will follow along.  CRITICAL CARE TIME: I have spent a total of 35 minutes with patient reviewing hospital notes, telemetry, EKGs, labs and examining the patient as well as establishing an assessment and plan that was discussed with the patient. > 50% of time was spent in direct patient care. The patient is critically ill with multi-organ system failure and requires high  complexity decision making for assessment and support, frequent evaluation and titration of therapies, application of advanced monitoring technologies and extensive interpretation of multiple databases.    For questions or updates, please contact [facility] Please consult [email or web address] for contact info under        Signed, Elouise Munroe, MD  07/18/2019, 9:53 AM

## 2019-07-18 NOTE — Progress Notes (Signed)
[facility] ADULT ICU REPLACEMENT PROTOCOL FOR AM LAB REPLACEMENT ONLY  The patient does apply for the [facility] Adult ICU Electrolyte Replacment Protocol based on the criteria listed below:   1. Is GFR >/= 40 ml/min? Yes.    Patient's GFR today is >60 2. Is urine output >/= 0.5 ml/kg/hr for the last 6 hours? Yes.   Patient's UOP is 1.57 ml/kg/hr 3. Is BUN < 60 mg/dL? Yes.    Patient's BUN today is 29 4. Abnormal electrolyte(s): K+ 3.3 5. Ordered repletion with: protocol 6. If a panic level lab has been reported, has the CCM MD in charge been notified? Yes.  .   Physician:  Dr.Sommer  Carlisle Beers 07/18/2019 6:18 AM

## 2019-07-18 NOTE — Progress Notes (Signed)
[place] Progress Note Patient Name: Riley Eaton DOB: 17-Jul-1941 MRN: [id number]   Date of Service  07/18/2019  HPI/Events of Note  Multiple watery stools - Request for Flexiseal.   eICU Interventions  Will order: 1. Place Flexiseal.      Intervention Category Major Interventions: Other:  Lysle Dingwall 07/18/2019, 11:56 PM

## 2019-07-18 NOTE — Progress Notes (Signed)
1) With propofol off for about an hour now, Riley Eaton is somewhat more interactive. He will nod/shake head to some questions. He will still not dependably move arms/legs to request, but did try. Now grimacing with abdominal palpation, somewhat over RUQ, significantly over LUQ.  2) Dr. Cherlynn Kaiser contacted me this morning to discuss Riley Eaton, and to hopefully develop a multidisciplinary plan. She indicates that she has evidence of likely LAD and/or other CAD, and that Riley Eaton would likely benefit from cath, but she is also concerned that if he did require stenting and DAPT that it would preclude any other necessary interventional treatment.   Given improving mental status, I am less inclined toward EEG, but can still consider it if he plateaus or worsens or has any activity that is suggestive of seizure. With previously identified splenomegaly, question of gallbladder disease, and tenderness on exam, I would update his CT w/ and w/o contrast (Cr nl), and see if he is developing any process which would account for his abdominal pain +/- intermittent fevers, assure that there is no surgical issue, and then maybe there will be a clearer path to treatment plan, including cardiac catheterization.   Bonna Gains, MD PhD 9:07 AM

## 2019-07-18 NOTE — Progress Notes (Addendum)
NAME:  Riley Eaton, MRN:  [id number], DOB:  [birth year], LOS: 5 ADMISSION DATE:  07/13/2019, CONSULTATION DATE:  07/13/19 REFERRING MD:  Roderic Palau  CHIEF COMPLAINT:  AMS, Abd pain   Brief History   Riley Eaton is a 78 y.o. male who presented to ED 12/1 with abd pain and AMS.  Had been seen 1 day earlier and CT abd / pelv showed cholelithiasis without cholecystitis.  History of present illness   Riley Eaton is a 78 y.o. male who has a PMH including but not limited to thoracic aortic aneurysm, CAD, chronic back pain, chronic opioid use (see "past medical history" for rest).  He presented to [facility] ED 12/1 with AMS and abd pain.  He had been seen in [facility] ED 1 day prior for abd pain.  Had CT of the abd at the time that demonstrated cholelithiasis without cholecystitis.  Surgery was consulted and recommended outpatient follow up.  He was subsequently discharged home.  12/1 had worsening of pain along with AMS; therefore, came to [facility] ED.  He was quite agitated per reports and received 1mg  dilaudid, 5mg  haldol, 1mg  ativan x 3 and had minimal response.   PCCM was asked to see in consultation for consideration of precedex.  Per chart review, he has no underlying hx of EtOH abuse or substance abuse.  He does have hx of chronic low back pain and chronic opioid use.  On review of outpatient med list, he had 5-325 norco prescribed for q6hrs PRN, 40mg  oxycontin TID, lyrica 200mg  TID, duloxetine 60mg  BID.  Per chart review, he had admission 09/24/18 through 10/06/18 for acute ascending cholangitis s/p ERCP and sphincterotomy with stenting of pancreatic duct (Dr. Therisa Doyne).  During that admission, he did have severe delirium felt to be due to narcotic withdrawal.  He did spend a few days in ICU and was briefly treated with precedex and fentanyl.  He was discharged and supposed to be evaluated by surgery as an outpatient for cholecystectomy; however, this was postponed due to the [place] pandemic.  Per discharge  summary, his medication regimen was adjusted prior to d/c and pt had been tolerating it well (oxycodone weaned from 80mg  BID to BID, lyrica reduced from 200mg  TID to 100mg  TID, cymbalta from 60mg  BID to 60mg  daily).  Past Medical History  has Thoracic aortic aneurysm ([place]); Coronary artery calcification seen on CT scan; Chronic low back pain; Nausea & vomiting; Choledocholithiasis; Chronic pain; Intractable vomiting; Urinary retention; Constipation; Protein-calorie malnutrition, severe; Acute metabolic encephalopathy; Mental status alteration; ACS (acute coronary syndrome) ([place]); Endotracheal tube present; and Alteration in nutrition on their problem list.  Significant Hospital Events   12/1 > admit. 12/4: worsening mental status and resp status unable to protect airway, tachypneic but sats are maintaining on [place] at this time. CCM reconsulted.   Consults:  12/1 PCCM. 12/2: cards  Procedures:  None.  Significant Diagnostic Tests:  CT A / P 11/30 > cholelithiasis, no cholecystitis.  Borderline splenomegaly.  Large hiatal hernia. CT head 12/3 >Significant motion limitations. No evidence of acute abnormality. Consider repeat exam based on clinical concern when patient is able to hold still. RUQ 12/1: 1. Biliary sludge and biliary sludge ball in the gallbladder. No findings to suggest an acute cholecystitis at this time. Echo 12/2: LVEF 99991111, Grade I diastolic dysfunction    Micro Data:  Blood 12/1 > ngtd SARS CoV2 12/1 > NEGATIVE  Antimicrobials:  Ceftriaxone 12/1 >  Flagyl 12/1 >   Interim history/subjective:  12/6:  1) OG placed yesterday after discussion with radiologist; most of stomach is intrathoracic, and contrast admin appears consistent with being placed past the GE junction.  2) Single episode of fever yest to 101.2. Afebrile since, until this a.m. again 101. 3) Not necessarily more responsive, but more alert this a.m., opening eyes to voice and tracking.   12/4:  reconsulted for worsening encephalopathy. Pt unresponsive this am with periods of apnea. Pt received fentanyl patch overnight.  Was given narcan without great improvement and now tachypneic, unable to protect airway and unresponsive.  12/1: Somnolent but opens eyes to noxious stimuli.  Objective:  Blood pressure (!) 153/103, pulse 85, temperature (!) 101.6 F (38.7 C), temperature source Axillary, resp. rate (!) 25, height 5\' 7"  (1.702 m), weight 69.9 kg, SpO2 100 %.    Vent Mode: PRVC FiO2 (%):  [40 %] 40 % Set Rate:  [16 bmp] 16 bmp Vt Set:  [530 mL] 530 mL PEEP:  [5 cmH20] 5 cmH20 Pressure Support:  [8 cmH20] 8 cmH20 Plateau Pressure:  [13 cmH20-15 cmH20] 13 cmH20   Intake/Output Summary (Last 24 hours) at 07/18/2019 0801 Last data filed at 07/18/2019 0700 Gross per 24 hour  Intake 2017.93 ml  Output 1195 ml  Net 822.93 ml   Filed Weights   07/14/19 2046  Weight: 69.9 kg    Examination: General: Elderly male, appears acutely ill in bed,  HEENT: [place]/AT. Sclerae anicteric. ETT in place, OG placed with fluoro  Cardiovascular: RRhythm, tachycardic, no M/R/G.  Lungs: Respirations CTA bilaterally, No W/R/R. Abdomen: BS x 4, soft, NT/ND.  Musculoskeletal: No gross deformities, no edema.  Skin: Intact, warm, no rashes. Neuro: CN II-VI intact, pt tracks midline, PERRL. Pt w/d to noxious stim, including all 4 extrem with attempts to place NGT. Does not respond to voice, or commands  Assessment & Plan:    Acute encephalopathy: - CTH negative -check ammonia, tsh for completeness sake -lactate and pct normal so less likely sepsis playing role -previous h/o opioid withdraw now somnolent with fentanyl patch, after d/w family there has been no missed dosing of opioid at home and prior to presentation on Monday (when he was sent home from [facility]) he was already having some confusion. Unlikely to be opioid withdraw.  -not protecting airway.  -no clinical indication of sepsis, with nl white  count, but now with intermittent fever spike - I would defer LP awaiting any potential intervention cardiology would want to entertain --EEG today --propofol off  --reculture  Acute resp failure:  -intubated for airway protection; SBT this morning; mental status improved but questionable to extubate yet -titrate vent -rass goal 0  Abd pain - unclear etiology; though, concerning for cholecystitis.  Had ERCP with sphincterotomy and pancreatic duct stenting on 09/25/18. - Empiric ceftriaxone and flagyl for now. - MRCP +/- HIDA pending  Prolonged qt:  -avoid prolonging agents -recheck prn  Elevated trop/NSTEMI:  -up to >2K -per primary and cardiology; cardiology pending. 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 --replete K, check Mg --Consult dietary for TF, start at trickle to make sure he tolerates with atypical gastric anatomy and presumptive placement of OGT.  Best Practice:  Diet:  Start trickle feeds today. Pain/Anxiety/Delirium protocol (if indicated): Fentanyl low dose scheduled as above.   VAP protocol (if indicated): N/A. DVT prophylaxis: Per primary. GI prophylaxis: Per primary. Glucose  control: Per primary. Mobility: BR. Code Status: Full. Family Communication: wife via phone. 12/4 Disposition: ICU  Labs   CBC: Recent Labs  Lab 07/13/19 1440  07/14/19 0757 07/15/19 0319 07/16/19 0409 07/16/19 1357 07/17/19 1632 07/18/19 0240  WBC 7.0  --  8.1 12.6* 10.4  --  6.2 7.1  NEUTROABS 5.4  --   --  10.7* 8.6*  --  4.9 5.5  HGB 13.7   < > 13.2 14.5 14.7 11.2* 11.9* 12.5*  HCT 38.2*   < > 36.9* 39.2 40.1 33.0* 33.6* 36.0*  MCV 93.9  --  94.1 91.8 92.4  --  96.8 97.6  PLT 140*  --  105* 138* 157  --  113* 122*   < > = values in this interval not displayed.   Basic Metabolic Panel: Recent Labs  Lab 07/14/19 0757 07/15/19 0319 07/15/19 1220 07/15/19  1438 07/16/19 0409 07/16/19 1357 07/18/19 0240  NA 144 143  --  147* 146* 150* 148*  K 3.2* 3.0*  --  4.0 3.1* 3.0* 3.3*  CL 110 110  --  111 114*  --  116*  CO2 22 19*  --  22 21*  --  22  GLUCOSE 144* 127*  --  137* 131*  --  120*  BUN 23 25*  --  33* 42*  --  29*  CREATININE 0.74 0.69  --  0.78 0.82  --  0.79  CALCIUM 9.3 9.3  --  9.7 9.4  --  8.3*  MG  --   --  2.2  --   --   --   --    GFR: Estimated Creatinine Clearance: 70 mL/min (by C-G formula based on SCr of 0.79 mg/dL). Recent Labs  Lab 07/13/19 1500 07/13/19 1845 07/14/19 0757 07/15/19 0319 07/15/19 1809 07/15/19 2113 07/16/19 0409 07/17/19 1632 07/18/19 0240  PROCALCITON  --   --  <0.10  --   --   --   --   --   --   WBC  --   --  8.1 12.6*  --   --  10.4 6.2 7.1  LATICACIDVEN 3.4* 1.2  --   --  1.3 1.0  --   --   --    Liver Function Tests: Recent Labs  Lab 07/13/19 1440 07/14/19 0757 07/15/19 0319 07/16/19 0409 07/18/19 0240  AST 42* 54*  55* 74* 62* 29  ALT 21 29  28  39 41 30  ALKPHOS 93 91  87 92 90 65  BILITOT 2.1* 1.7*  1.5* 1.9* 1.4* 0.9  PROT 7.6 7.1  7.0 6.9 6.8 5.9*  ALBUMIN 4.5 4.2  4.2 4.2 4.0 3.1*   Recent Labs  Lab 07/12/19 1320 07/13/19 1440  LIPASE 17 18   Recent Labs  Lab 07/16/19 1130  AMMONIA 37*   ABG    Component Value Date/Time   PHART 7.408 07/16/2019 1357   PCO2ART 31.9 (L) 07/16/2019 1357   PO2ART 294.0 (H) 07/16/2019 1357   HCO3 20.3 07/16/2019 1357   TCO2 21 (L) 07/16/2019 1357   ACIDBASEDEF 4.0 (H) 07/16/2019 1357   O2SAT 100.0 07/16/2019 1357    Coagulation Profile: No results for input(s): INR, PROTIME in the last 168 hours. Cardiac Enzymes: No results for input(s): CKTOTAL, CKMB, CKMBINDEX, TROPONINI in the last 168 hours. HbA1C: No results found for: HGBA1C CBG: Recent Labs  Lab 07/16/19 1215 07/16/19 1612 07/16/19 1959 07/17/19 1936 07/17/19 2316  GLUCAP 121* 125* 106* 97 95  Critical care time: The patient is critically ill  with multiple organ systems failure and requires high complexity decision making for assessment and support, frequent evaluation and titration of therapies, application of advanced monitoring technologies and extensive interpretation of multiple databases.  Critical care time 42 mins. This represents my time independent of the NPs time taking care of the pt. This is excluding procedures.   Bonna Gains MD, PhD [facility] 07/18/2019, 8:11 AM

## 2019-07-18 NOTE — Progress Notes (Signed)
PROGRESS NOTE  Riley Eaton VZD:[id number] DOB: [birth year] DOA: 07/13/2019 PCP: Aletha Halim., PA-C  Brief History   The patient is a 78 yr old man who presented to [facility] ED on 07/13/2019 with complaints of right upper quadrant pain. He had been seen at [facility] on 07/12/2019 for the same complaints. CT of the abdomen and pelvis on 07/12/2019 demonstrated cholelithiasis, but no evidence of cholecystitis. Surgery was consulted, and they felt that this could be managed as outpatient. The patient was sent home.   He returned to [facility] ED on 06/13/2019 for increase in abdominal pain, nausea and vomiting. They patient also was very agitated upon returning. Per his wife he is acting as he did last summer when he had cholelithiasis requiring ERCP and stone retrieval. The patient apparently characterized the pain as severe at that time. It was right upper quadrant in location. No palliative or provocative factors. Right upper quadrant ultrasound was performed in the ED on 07/13/2019. It demonstrated bilary sludge and a biliary sludge ball in the gallbladder. No findings to suggest an acute cholecystitis at this time.   [facility] were consulted to admit the patient for further evaluation and treatment. Overnight the patient has become more delirious. He is very agitated this morning and cannot follow commands.   Initial troponin was 17 on presentation to [facility] ED. It has increased to 684. The patient has received an aspirin and metoprolol. Cardiology has been consulted and has ordered an echocardiogram.  LFT's remain elevated. Will consider HIDA Scan, GI/General surgery consult pending result of cardiac work up and improvement in the patient's mental status.   Palliative care has been consulted. Out of concern that some of the patient's symptoms may be due to opiate withdrawal (120 mg oxycodone daily with prn hydrocodone) the patient was started on a Fentanyl patch with increased doses of prn morphine.  The patient also received   Rapid response called on am 07/16/2019 due to unresponsive with [place] respirations. Some improvement with Narcan x 2. PCCM consulted.  The patient is now intubated and sedated. All cares per PCCM. MRI brain - unremarkable. Plan is to repeat CT abdomen/pelvis to help elucidate sequence of investigations/interventions. No LP planned as it would likely have low yield and may complicate any cardiac procedures that are planned. EEG also planned.  Consultants  . Cardiology . Palliative care . PCCM  Procedures  . Intubation  Antibiotics   Anti-infectives (From admission, onward)   Start     Dose/Rate Route Frequency Ordered Stop   07/17/19 0100  vancomycin (VANCOCIN) IVPB 750 mg/150 ml premix     750 mg 150 mL/hr over 60 Minutes Intravenous Every 12 hours 07/16/19 1240     07/16/19 1400  acyclovir (ZOVIRAX) 700 mg in dextrose 5 % 100 mL IVPB     10 mg/kg  69.9 kg 114 mL/hr over 60 Minutes Intravenous Every 8 hours 07/16/19 1240     07/16/19 1245  cefTRIAXone (ROCEPHIN) 2 g in sodium chloride 0.9 % 100 mL IVPB     2 g 200 mL/hr over 30 Minutes Intravenous Every 12 hours 07/16/19 1240     07/16/19 1245  vancomycin (VANCOCIN) 1,500 mg in sodium chloride 0.9 % 500 mL IVPB     1,500 mg 250 mL/hr over 120 Minutes Intravenous  Once 07/16/19 1240 07/16/19 1609   07/16/19 1245  ampicillin (OMNIPEN) 2 g in sodium chloride 0.9 % 100 mL IVPB     2 g 300 mL/hr over  20 Minutes Intravenous Every 4 hours 07/16/19 1240     07/13/19 1900  cefTRIAXone (ROCEPHIN) 1 g in sodium chloride 0.9 % 100 mL IVPB  Status:  Discontinued     1 g 200 mL/hr over 30 Minutes Intravenous Every 24 hours 07/13/19 1825 07/16/19 1240   07/13/19 1900  metroNIDAZOLE (FLAGYL) IVPB 500 mg     500 mg 100 mL/hr over 60 Minutes Intravenous Every 8 hours 07/13/19 1825       Subjective  The patient is intubated. He is more responsive this morning and appears attentive.  Objective   Vitals:   Vitals:   07/18/19 1220 07/18/19 1230  BP: (!) 141/111 (!) 160/103  Pulse: 86 83  Resp: 17 (!) 21  Temp:    SpO2: 98% 100%   Exam:  Constitutional:  The patient is intubated. No acute distress. More interactive.  Eyes:  . PERRLA Respiratory:  . No wheezes, rales, or rhonchi . Lung sounds are diminished bilaterally . No tactile fremitus Cardiovascular:  . Regular rate and rhythm. . Tachycardic . No murmurs, ectopy, or gallups. . No lateral PMI. No thrills. Abdomen:  . Abdomen is soft, with tenderness in the right upper quandrant, mild distention . Abdomen is scaffoid. . No hernias, masses, or organomegaly . Hypoactive bowel sounds.  Musculoskeletal:  . No cyanosis, clubbing, or edema . Cachectic Skin:  . No rashes, lesions, ulcers . palpation of skin: no induration or nodules Neurologic:  . Unable to evaluate as the patient is unable to cooperate with exam. Psychiatric:  . Unable to evaluate as the patient is unable to cooperate with exam. Intubated.  I have personally reviewed the following:   Today's Data  . Vitals, CMP, CBC  Cardiology Data  . EKG (07/13/2019, 07/14/2019)  Other Data  . CT Abdomen, Right upper quadrant ultrasound.  Scheduled Meds: . chlorhexidine gluconate (MEDLINE KIT)  15 mL Mouth Rinse BID  . Chlorhexidine Gluconate Cloth  6 each Topical Daily  . cloNIDine  0.1 mg Transdermal Weekly  . enoxaparin (LOVENOX) injection  40 mg Subcutaneous Q24H  . feeding supplement (VITAL HIGH PROTEIN)  1,000 mL Per Tube Q24H  . mouth rinse  15 mL Mouth Rinse 10 times per day  . metoprolol tartrate  10 mg Intravenous Q6H  . naloxegol oxalate  25 mg Oral Daily  . pantoprazole (PROTONIX) IV  40 mg Intravenous Q24H   Continuous Infusions: . acyclovir Stopped (07/18/19 0744)  . ampicillin (OMNIPEN) IV 2 g (07/18/19 1228)  . cefTRIAXone (ROCEPHIN)  IV Stopped (07/18/19 1109)  . metronidazole 100 mL/hr at 07/18/19 1200  . propofol (DIPRIVAN) infusion  Stopped (07/18/19 0748)  . vancomycin Stopped (07/18/19 0349)    Active Problems:   Chronic low back pain   Choledocholithiasis   Acute metabolic encephalopathy   Mental status alteration   ACS (acute coronary syndrome) (HCC)   Endotracheal tube present   Alteration in nutrition   LOS: 5 days   A & P   NSTEMI: Troponins have elevated from 17 to 684 to 1000. EKG earlier today demonstrated septal st segment depressions.Cardiology has been consulted and is checking an echocardiogram. The patient has been given an aspirin and IV lopressor. This could possibly be the cause of the patient's "right upper quadrant" abdominal pain. Continue to monitor on telemetry. I have discussed the patient with Dr. Margaretann Loveless. Echocardiogram was performed on 07/14/2019. EF 30-35% with grossly reduced function in the left ventricle. Grade 1 diastolic dysfunction. Pt was admitted to  a progressive care bed, but has now been trasferred to the ICU. Cardiac intervention has been held off due to possible complication for treatment of other issues. Repeat CT abdomen and pelvis ordered to help elucidate.  Unresponsive: Improved level of responsiveness. . The patient is now intubated, but sedation has been let up.   Cholangitis/Cholecystitis ([place]?): Pt does not appear septic. No fever, no elevation of WBC, normal lactic acid, hypertention with a mild elevation of heart rate. Procalcitonin is negative. Rt upper quadrant ultrasound demonstrates no gallbladder wall thickening or pericholecystic edema. It does demonstrate sludge and a "sludge ball". No CBD dilatation is noted. LFT's are mildly elevated. The patient will need MRCP and HIDA scan, but this will have to wait until cardiac/encephalopathic issues are sorted out. He is receiving empiric rocephin and flagyl.  Delirium: Improving. Patient was agitated and confused/lethargic/unresponsive. He is now intubated and sedated. Delirium is likely due to acute illness.CT head  and MRI head have been performed and do not demonstrate a cause for his change in mental status.I have consulted palliative care.  Hypertension: IV metoprolol. Monitor blood pressure and heart rate. HR and blood pressure remain elevated.[name]   History of thoracic aortic aneurysm: Noted.   I have seen and examined this patient myself. I have spent 30 minutes in his evaluation and care. All cares are currently as per PCCM.   CODE STATUS: Full DVT Prophylaxis: Heparin gtt as per cardiology Family Communication: None as of yet. Disposition: tbd  Dalyah Pla, DO [facility] Direct contact: see [email or web address]  7PM-7AM contact night coverage as above 07/18/2019, 2:33 PM  LOS: 1 day

## 2019-07-18 NOTE — Progress Notes (Signed)
Patient transported to CT & back on the ventilator with no issues.  Ashley Mariner RRT

## 2019-07-18 NOTE — Progress Notes (Signed)
Brief Nutrition Note RD working remotely.  Consult received for enteral/tube feeding initiation and management. Per consult plan is to initiate tube feeds at trickle rate today to see how patient will tolerate in setting of hiatal hernia.  According to chest x-ray from yesterday NGT projects over medial left lower chest likely within hiatal hernia.  Full RD assessment with goal tube feed recommendations was completed on 12/5. Initiated Vital High Protein at 20 mL/hr per consult today.  Jacklynn Barnacle, MS, RD, LDN Office: [phone] Pager: [phone] After Hours/Weekend Pager: [phone]

## 2019-07-19 ENCOUNTER — Inpatient Hospital Stay (HOSPITAL_COMMUNITY): Payer: Medicare Other

## 2019-07-19 DIAGNOSIS — I420 Dilated cardiomyopathy: Secondary | ICD-10-CM | POA: Diagnosis not present

## 2019-07-19 DIAGNOSIS — I214 Non-ST elevation (NSTEMI) myocardial infarction: Secondary | ICD-10-CM | POA: Diagnosis not present

## 2019-07-19 DIAGNOSIS — Z9911 Dependence on respirator [ventilator] status: Secondary | ICD-10-CM

## 2019-07-19 DIAGNOSIS — Z978 Presence of other specified devices: Secondary | ICD-10-CM

## 2019-07-19 DIAGNOSIS — I249 Acute ischemic heart disease, unspecified: Secondary | ICD-10-CM

## 2019-07-19 DIAGNOSIS — R1011 Right upper quadrant pain: Secondary | ICD-10-CM

## 2019-07-19 DIAGNOSIS — G9341 Metabolic encephalopathy: Principal | ICD-10-CM

## 2019-07-19 DIAGNOSIS — R109 Unspecified abdominal pain: Secondary | ICD-10-CM

## 2019-07-19 LAB — GLUCOSE, CAPILLARY
Glucose-Capillary: 124 mg/dL — ABNORMAL HIGH (ref 70–99)
Glucose-Capillary: 132 mg/dL — ABNORMAL HIGH (ref 70–99)
Glucose-Capillary: 132 mg/dL — ABNORMAL HIGH (ref 70–99)
Glucose-Capillary: 79 mg/dL (ref 70–99)
Glucose-Capillary: 91 mg/dL (ref 70–99)
Glucose-Capillary: 99 mg/dL (ref 70–99)

## 2019-07-19 LAB — URINE CULTURE: Culture: NO GROWTH

## 2019-07-19 LAB — CBC WITH DIFFERENTIAL/PLATELET
Abs Immature Granulocytes: 0.1 10*3/uL — ABNORMAL HIGH (ref 0.00–0.07)
Basophils Absolute: 0 10*3/uL (ref 0.0–0.1)
Basophils Relative: 0 %
Eosinophils Absolute: 0.1 10*3/uL (ref 0.0–0.5)
Eosinophils Relative: 1 %
HCT: 34.7 % — ABNORMAL LOW (ref 39.0–52.0)
Hemoglobin: 12.6 g/dL — ABNORMAL LOW (ref 13.0–17.0)
Immature Granulocytes: 1 %
Lymphocytes Relative: 11 %
Lymphs Abs: 1.1 10*3/uL (ref 0.7–4.0)
MCH: 34.1 pg — ABNORMAL HIGH (ref 26.0–34.0)
MCHC: 36.3 g/dL — ABNORMAL HIGH (ref 30.0–36.0)
MCV: 94 fL (ref 80.0–100.0)
Monocytes Absolute: 0.8 10*3/uL (ref 0.1–1.0)
Monocytes Relative: 8 %
Neutro Abs: 7.3 10*3/uL (ref 1.7–7.7)
Neutrophils Relative %: 79 %
Platelets: 117 10*3/uL — ABNORMAL LOW (ref 150–400)
RBC: 3.69 MIL/uL — ABNORMAL LOW (ref 4.22–5.81)
RDW: 13.8 % (ref 11.5–15.5)
WBC: 9.3 10*3/uL (ref 4.0–10.5)
nRBC: 0 % (ref 0.0–0.2)

## 2019-07-19 LAB — BASIC METABOLIC PANEL
Anion gap: 10 (ref 5–15)
BUN: 17 mg/dL (ref 8–23)
CO2: 25 mmol/L (ref 22–32)
Calcium: 8.4 mg/dL — ABNORMAL LOW (ref 8.9–10.3)
Chloride: 109 mmol/L (ref 98–111)
Creatinine, Ser: 0.62 mg/dL (ref 0.61–1.24)
GFR calc Af Amer: >60 mL/min (ref >60)
GFR calc non Af Amer: >60 mL/min (ref >60)
Glucose, Bld: 148 mg/dL — ABNORMAL HIGH (ref 70–99)
Potassium: 3.2 mmol/L — ABNORMAL LOW (ref 3.5–5.1)
Sodium: 144 mmol/L (ref 135–145)

## 2019-07-19 LAB — TRIGLYCERIDES: Triglycerides: 101 mg/dL (ref <150)

## 2019-07-19 LAB — MRSA PCR SCREENING: MRSA by PCR: POSITIVE — AB

## 2019-07-19 IMAGING — NM NM HEPATOBILIARY IMAGE, INC GB
3 series · 18 of 18 positions shown · non-contrast
Comparison: CT AP [DATE]

CLINICAL DATA: Right upper quadrant pain evaluate for
cholecystitis.

EXAM:
NUCLEAR MEDICINE HEPATOBILIARY IMAGING
TECHNIQUE: Sequential images of the abdomen were obtained [DATE] minutes
following intravenous administration of radiopharmaceutical.
RADIOPHARMACEUTICALS:  5.09 mCi [NS]  Choletec IV

[he hepatobiliary · 4.52mm/px · 6 of 30 frames shown (1 of 3)]
[frame 3/30]
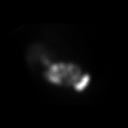
[frame 8/30]
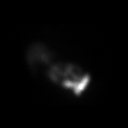
[frame 13/30]
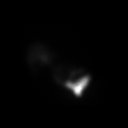
[frame 18/30]
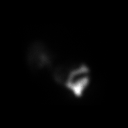
[frame 23/30]
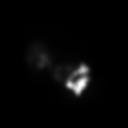
[frame 28/30]
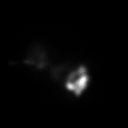

[he hepatobiliary · 4.52mm/px · 6 of 25 frames shown (2 of 3)]
[frame 3/25]
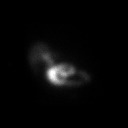
[frame 7/25]
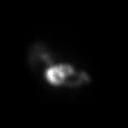
[frame 11/25]
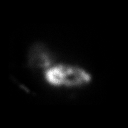
[frame 15/25]
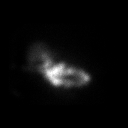
[frame 19/25]
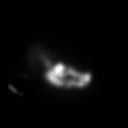
[frame 23/25]
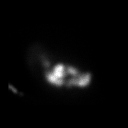

[he hepatobiliary · 4.52mm/px · 6 of 49 frames shown (3 of 3)]
[frame 5/49]
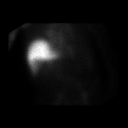
[frame 13/49]
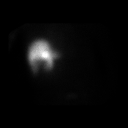
[frame 21/49]
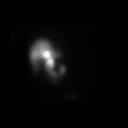
[frame 29/49]
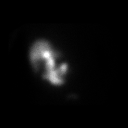
[frame 37/49]
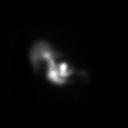
[frame 45/49]
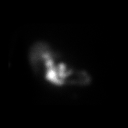

[18 of 18 positions shown; findings below may reference images not displayed]

FINDINGS: Prompt uptake and biliary excretion of activity by the liver is
seen. Biliary activity passes into small bowel, consistent with
patent common bile duct.

After 60 minutes of imaging no gallbladder activity visualized. The
patient subsequently received 3 mg of morphine, IV. Imaging was
carried out for an additional 30 minutes with subsequent
visualization of the gallbladder reflecting patency of cystic duct.
IMPRESSION: 1. Patent cystic duct without evidence for acute cholecystitis.
2. Delayed filling of the gallbladder following morphine
administration which may be seen with chronic cholecystitis.

## 2019-07-19 MED ORDER — POTASSIUM CHLORIDE 20 MEQ/15ML (10%) PO SOLN
30.0000 meq | ORAL | Status: AC
  Administered 2019-07-19 (×2): 30 meq
  Filled 2019-07-19 (×2): qty 30

## 2019-07-19 MED ORDER — TRAZODONE HCL 50 MG PO TABS
50.0000 mg | ORAL_TABLET | Freq: Once | ORAL | Status: AC
  Administered 2019-07-19: 50 mg via ORAL
  Filled 2019-07-19: qty 1

## 2019-07-19 MED ORDER — MORPHINE SULFATE (PF) 4 MG/ML IV SOLN
3.0000 mg | Freq: Once | INTRAVENOUS | Status: AC
  Administered 2019-07-19: 3 mg via INTRAVENOUS

## 2019-07-19 MED ORDER — MORPHINE SULFATE (PF) 4 MG/ML IV SOLN
INTRAVENOUS | Status: AC
  Filled 2019-07-19: qty 1

## 2019-07-19 MED ORDER — ORAL CARE MOUTH RINSE
15.0000 mL | Freq: Two times a day (BID) | OROMUCOSAL | Status: DC
  Administered 2019-07-19 – 2019-08-02 (×22): 15 mL via OROMUCOSAL

## 2019-07-19 MED ORDER — SODIUM CHLORIDE 0.9 % IV SOLN: 2.0000 g | INTRAVENOUS | Status: DC

## 2019-07-19 MED ORDER — MUPIROCIN 2 % EX OINT
1.0000 "application " | TOPICAL_OINTMENT | Freq: Two times a day (BID) | CUTANEOUS | Status: AC
  Administered 2019-07-19 – 2019-07-23 (×10): 1 via NASAL
  Filled 2019-07-19 (×2): qty 22

## 2019-07-19 MED ORDER — ASPIRIN 81 MG PO CHEW
81.0000 mg | CHEWABLE_TABLET | Freq: Every day | ORAL | Status: DC
  Administered 2019-07-19 – 2019-07-21 (×3): 81 mg via NASOGASTRIC
  Filled 2019-07-19 (×3): qty 1

## 2019-07-19 MED ORDER — TECHNETIUM TC 99M MEBROFENIN IV KIT
5.0900 | PACK | Freq: Once | INTRAVENOUS | Status: AC | PRN
  Administered 2019-07-19: 5.09 via INTRAVENOUS

## 2019-07-19 NOTE — Progress Notes (Signed)
[facility] ADULT ICU REPLACEMENT PROTOCOL FOR AM LAB REPLACEMENT ONLY  The patient does apply for the [facility] Adult ICU Electrolyte Replacment Protocol based on the criteria listed below:   1. Is GFR >/= 40 ml/min? Yes.    Patient's GFR today is >60 2. Is urine output >/= 0.5 ml/kg/hr for the last 6 hours? Yes.   Patient's UOP is 0.8 ml/kg/hr 3. Is BUN < 60 mg/dL? Yes.    Patient's BUN today is 17 4. Abnormal electrolyte(s): K+3.2 5. Ordered repletion with: protocol 6. If a panic level lab has been reported, has the CCM MD in charge been notified? Yes.  .   Physician:  Dr.Sommer  Carlisle Beers 07/19/2019 5:37 AM

## 2019-07-19 NOTE — Progress Notes (Addendum)
NAME:  Riley Eaton, MRN:  [id number], DOB:  May 13, 1941, LOS: 6 ADMISSION DATE:  07/13/2019, CONSULTATION DATE:  07/13/19 REFERRING MD:  Roderic Palau  CHIEF COMPLAINT:  AMS, Abd pain   Brief History   Riley Eaton is a 78 y.o. male who presented to ED 12/1 with abd pain and AMS.  Had been seen 1 day earlier and CT abd / pelv showed cholelithiasis without cholecystitis.  History of present illness   Riley Eaton is a 78 y.o. male who has a PMH including but not limited to thoracic aortic aneurysm, CAD, chronic back pain, chronic opioid use (see "past medical history" for rest).  He presented to [facility] ED 12/1 with AMS and abd pain.  He had been seen in [facility] ED 1 day prior for abd pain.  Had CT of the abd at the time that demonstrated cholelithiasis without cholecystitis.  Surgery was consulted and recommended outpatient follow up.  He was subsequently discharged home.  12/1 had worsening of pain along with AMS; therefore, came to [facility] ED.  He was quite agitated per reports and received 1mg  dilaudid, 5mg  haldol, 1mg  ativan x 3 and had minimal response.   PCCM was asked to see in consultation for consideration of precedex.  Per chart review, he has no underlying hx of EtOH abuse or substance abuse.  He does have hx of chronic low back pain and chronic opioid use.  On review of outpatient med list, he had 5-325 norco prescribed for q6hrs PRN, 40mg  oxycontin TID, lyrica 200mg  TID, duloxetine 60mg  BID.  Per chart review, he had admission 09/24/18 through 10/06/18 for acute ascending cholangitis s/p ERCP and sphincterotomy with stenting of pancreatic duct (Dr. Therisa Doyne).  During that admission, he did have severe delirium felt to be due to narcotic withdrawal.  He did spend a few days in ICU and was briefly treated with precedex and fentanyl.  He was discharged and supposed to be evaluated by surgery as an outpatient for cholecystectomy; however, this was postponed due to the [place] pandemic.  Per discharge  summary, his medication regimen was adjusted prior to d/c and pt had been tolerating it well (oxycodone weaned from 80mg  BID to BID, lyrica reduced from 200mg  TID to 100mg  TID, cymbalta from 60mg  BID to 60mg  daily).  Past Medical History  has Thoracic aortic aneurysm ([place]); Coronary artery calcification seen on CT scan; Chronic low back pain; Nausea & vomiting; Choledocholithiasis; Chronic pain; Intractable vomiting; Urinary retention; Constipation; Protein-calorie malnutrition, severe; Acute metabolic encephalopathy; Mental status alteration; ACS (acute coronary syndrome) ([place]); Endotracheal tube present; Alteration in nutrition; and Encephalopathy on their problem list.  Significant Hospital Events   12/1 > admit. 12/4: worsening mental status and resp status unable to protect airway, tachypneic but sats are maintaining on [place] at this time. CCM reconsulted.   Consults:  12/1 PCCM. 12/2: cards  Procedures:  None.  Significant Diagnostic Tests:  CT A / P 11/30 > cholelithiasis, no cholecystitis.  Borderline splenomegaly.  Large hiatal hernia. CT head 12/3 >Significant motion limitations. No evidence of acute abnormality. Consider repeat exam based on clinical concern when patient is able to hold still. RUQ 12/1: 1. Biliary sludge and biliary sludge ball in the gallbladder. No findings to suggest an acute cholecystitis at this time. Echo 12/2: LVEF [id number], Grade I diastolic dysfunction [id number] MRI brain > No acute intracranial abnormality identified. Mild nonspecific T2 and FLAIR hyperintensity in the deep gray nuclei, with a tiny chronic lacune in the left  caudate. CT abdomen 12/6 > Moderate size hiatal hernia containing a portion of the stomach. Enteric tube with tip in the distal esophagus close to the GE junction within the hiatal hernia. Cholelithiasis.  Micro Data:  Blood 12/1 > ngtd SARS CoV2 12/1 > NEGATIVE Sputum 12/6 > staph aureus few >>>  Antimicrobials:  Ampicillin 12/4 >12/6  Acyclovir 12/4 >12/6 Ceftriaxone 12/1 >  Flagyl 12/1 >  Vancomycin 12/4 >  Interim history/subjective:  No acute events overnight. Tolerating SBT. For HIDA scan today.   Objective:  Blood pressure (!) 136/99, pulse 66, temperature 99.6 F (37.6 C), temperature source Oral, resp. rate (!) 24, height 5\' 7"  (1.702 m), weight 69.9 kg, SpO2 100 %.    Vent Mode: PRVC FiO2 (%):  [40 %] 40 % Set Rate:  [16 bmp] 16 bmp Vt Set:  [530 mL] 530 mL PEEP:  [5 cmH20] 5 cmH20 Pressure Support:  [8 cmH20] 8 cmH20 Plateau Pressure:  [12 cmH20-14 cmH20] 14 cmH20   Intake/Output Summary (Last 24 hours) at 07/19/2019 1046 Last data filed at 07/19/2019 1000 Gross per 24 hour  Intake 3014.7 ml  Output 1630 ml  Net 1384.7 ml   Filed Weights   07/14/19 2046  Weight: 69.9 kg    Examination: General: Elderly appearing male on vent HEENT: [place]/AT, PERRL, no JVD Cardiovascular: RRR, no MRG Lungs: Clear Abdomen: non-tender today Musculoskeletal: No gross deformities, no edema.  Skin: Grossly intact Neuro: Alert, follows commands. Nods and shakes head appropriately. Tries to write, but is unable. Just scribbles.   Assessment & Plan:   Acute encephalopathy:- CTH and MRI negative. TSH and ammonia don't seem to be responsible. Lactic and PCT wnl. Less likely opioid withdrawal as fentanyl patch present on arrival and family controls opioids at home. LP deferred due to clinical improvement.  - more awake today, follow clinically  Acute resp failure: -intubated for airway protection;  - SBT this morning tolerating OK. Nearing extubation perhaps after scan later today.  - rass goal 0 - Early sputum culture showing few SA. Follow.   Abd pain - unclear etiology; though, concerning for cholecystitis.  Had ERCP with sphincterotomy and pancreatic duct stenting on 09/25/18. CT x 2 shows only cholelithiasis but no active disease. Stomach pain has been exquisite up until this morning.  - [place] to ceftriaxone,  flagyl, vancomycin - MRCP +/- HIDA pending  Prolonged qt:  -avoid prolonging agents -recheck prn  Elevated trop/NSTEMI: - Trop to >2K, now trending down -per cardiology. Will require further workup once clinically improves  HFrEF:  -newly found on echo EF normal in 2017 -ischemic eval pending  HTN:  -clonidine -prn labetalol   F/E/N: hypokalemia. - Replete K, check Mg - NPO, restart TF after HIDA  Best Practice:  Diet:  NPO for HIDA Pain/Anxiety/Delirium protocol (if indicated): PRN sedation VAP protocol (if indicated): N/A. DVT prophylaxis: Lovenox GI prophylaxis: PPI Glucose control: CBG Mobility: BR Code Status: Full Family Communication: Daughter updated 12/7 Disposition: ICU  Labs   CBC: Recent Labs  Lab 07/15/19 0319 07/16/19 0409 07/16/19 1357 07/17/19 1632 07/18/19 0240 07/19/19 0302  WBC 12.6* 10.4  --  6.2 7.1 9.3  NEUTROABS 10.7* 8.6*  --  4.9 5.5 7.3  HGB 14.5 14.7 11.2* 11.9* 12.5* 12.6*  HCT 39.2 40.1 33.0* 33.6* 36.0* 34.7*  MCV 91.8 92.4  --  96.8 97.6 94.0  PLT 138* 157  --  113* 122* 123XX123*   Basic Metabolic Panel: Recent Labs  Lab 07/15/19 0319 07/15/19  1220 07/15/19 1438 07/16/19 0409 07/16/19 1357 07/18/19 0240 07/19/19 0302  NA 143  --  147* 146* 150* 148* 144  K 3.0*  --  4.0 3.1* 3.0* 3.3* 3.2*  CL 110  --  111 114*  --  116* 109  CO2 19*  --  22 21*  --  22 25  GLUCOSE 127*  --  137* 131*  --  120* 148*  BUN 25*  --  33* 42*  --  29* 17  CREATININE 0.69  --  0.78 0.82  --  0.79 0.62  CALCIUM 9.3  --  9.7 9.4  --  8.3* 8.4*  MG  --  2.2  --   --   --   --   --    GFR: Estimated Creatinine Clearance: 70 mL/min (by C-G formula based on SCr of 0.62 mg/dL). Recent Labs  Lab 07/13/19 1500 07/13/19 1845 07/14/19 0757  07/15/19 1809 07/15/19 2113 07/16/19 0409 07/17/19 1632 07/18/19 0240 07/19/19 0302  PROCALCITON  --   --  <0.10  --   --   --   --   --   --   --   WBC  --   --  8.1   < >  --   --  10.4 6.2 7.1 9.3   LATICACIDVEN 3.4* 1.2  --   --  1.3 1.0  --   --   --   --    < > = values in this interval not displayed.   Liver Function Tests: Recent Labs  Lab 07/13/19 1440 07/14/19 0757 07/15/19 0319 07/16/19 0409 07/18/19 0240  AST 42* 54*  55* 74* 62* 29  ALT 21 29  28  39 41 30  ALKPHOS 93 91  87 92 90 65  BILITOT 2.1* 1.7*  1.5* 1.9* 1.4* 0.9  PROT 7.6 7.1  7.0 6.9 6.8 5.9*  ALBUMIN 4.5 4.2  4.2 4.2 4.0 3.1*   Recent Labs  Lab 07/12/19 1320 07/13/19 1440  LIPASE 17 18   Recent Labs  Lab 07/16/19 1130 07/18/19 1515  AMMONIA 37* 40*   ABG    Component Value Date/Time   PHART 7.408 07/16/2019 1357   PCO2ART 31.9 (L) 07/16/2019 1357   PO2ART 294.0 (H) 07/16/2019 1357   HCO3 20.3 07/16/2019 1357   TCO2 21 (L) 07/16/2019 1357   ACIDBASEDEF 4.0 (H) 07/16/2019 1357   O2SAT 100.0 07/16/2019 1357    Coagulation Profile: No results for input(s): INR, PROTIME in the last 168 hours. Cardiac Enzymes: No results for input(s): CKTOTAL, CKMB, CKMBINDEX, TROPONINI in the last 168 hours. HbA1C: No results found for: HGBA1C CBG: Recent Labs  Lab 07/18/19 0800 07/18/19 1528 07/18/19 2123 07/19/19 0033 07/19/19 0808  GLUCAP 110* 142* 121* 124* 132*      CRITICAL CARE Performed by: Corey Harold   Total critical care time: 40 minutes  Critical care time was exclusive of separately billable procedures and treating other patients.  Critical care was necessary to treat or prevent imminent or life-threatening deterioration.  Critical care was time spent personally by me on the following activities: development of treatment plan with patient and/or surrogate as well as nursing, discussions with consultants, evaluation of patient's response to treatment, examination of patient, obtaining history from patient or surrogate, ordering and performing treatments and interventions, ordering and review of laboratory studies, ordering and review of radiographic studies, pulse  oximetry and re-evaluation of patient's condition.   Georgann Housekeeper, AGACNP-BC [place]  See Amion for personal pager PCCM on call pager [phone]  07/19/2019 11:03 AM   Pulmonary critical care attending:  78 year old past medical history of thoracic aneurysm, CAD, chronic back pain, chronic opioid abuse presented with worsening mental status.  There was initial concern for potential meningitis on admission however sepsis source was not clear.  He started to improve therefore LP was deferred per documentation.  Also has a history of acute ascending cholangitis status post ERCP T with thinks neurotomy and stenting of the duct.  Therefore gallbladder was deemed a potential source.  Plan today for a HIDA scan.  Patient is now awake alert following commands doing well on mechanical ventilation.  Tolerating SBT this morning.  Discussed with nursing staff and nurse practitioner Best plans to leave him intubated until after HIDA scan and hopefully can extubate.  BP (!) 162/108   Pulse 87   Temp 99.7 F (37.6 C) (Oral)   Resp (!) 23   Ht 5\' 7"  (1.702 m)   Wt 69.9 kg   SpO2 100%   BMI 24.14 kg/m   General: Elderly male, intubated on mechanical life support Neck: Trachea midline endotracheal tube in place Heart: Regular rhythm, S1-S2 Lungs: Bilateral ventilated breath sounds  Labs: Reviewed Chest x-ray: Reviewed HIDA scan pending  Assessment: Acute metabolic encephalopathy, unclear etiology, possible sepsis related Acute hypoxemic respiratory failure requiring intubation and mechanical ventilation secondary to above Abdominal pain, possible source of sepsis there was concern for cholecystitis history of ERCP with sphincterotomy and ductal stent.  Treated empirically Prolonged QT NSTEMI Chronic systolic heart failure Hypertension  Plan: Patient tolerating SBT this morning.  Once completed HIDA scan can consider liberation from mechanical support. Narrow  antibiotics to ceftriaxone plus Flagyl Pending HIDA scan results we will make further recommendations. Appreciate cardiology input and surgery input. Okay to add aspirin down NG tube Plans for possible heart catheterization pending infection resolution  This patient is critically ill with multiple organ system failure; which, requires frequent high complexity decision making, assessment, support, evaluation, and titration of therapies. This was completed through the application of advanced monitoring technologies and extensive interpretation of multiple databases. During this encounter critical care time was devoted to patient care services described in this note for 32 minutes.  Garner Nash, DO [facility] 07/19/2019 5:33 PM

## 2019-07-19 NOTE — Progress Notes (Signed)
Nutrition Follow-up  DOCUMENTATION CODES:   Not applicable  INTERVENTION:   Tube Feeding:  Continue trickle TF of Vital High Protein at 20 ml/hr Goal rate: Vital High Protein at 60 ml/hr Provides 1440 kcals, 127 g of protein and 1210 mL of free water   NUTRITION DIAGNOSIS:   Increased nutrient needs related to acute illness as evidenced by estimated needs.  Being addressed via TF   GOAL:   Patient will meet greater than or equal to 90% of their needs  Progressing  MONITOR:   Diet advancement, Vent status, Skin, TF tolerance, Weight trends, Labs, I & O's  REASON FOR ASSESSMENT:   Ventilator    ASSESSMENT:   Patient with PMH significant for chronic low back pain, chronic opoid abuse, PCM, CAD, and thoracic aortic aneurysm. Presents this admission with AMS from unclear etiology and chololithiasis.  12/01 Admit 12/04 Rapid Response, intubated 12/06 CT abdomen with moderate size hiatal hernia containing a portion of the stomach. Enteric tube with tip in the distal esophagus close to the GE junction within the hiatal hernia.  HIDA scan today  Patient is currently intubated on ventilator support, tolerated SBT this morning, nearing extubation, possibly after scan today MV: 12.2 L/min Temp (24hrs), Avg:99.1 F (37.3 C), Min:98.2 F (36.8 C), Max:99.8 F (37.7 C)  No po intake prior to intubation as pt NPO  Vital High Protein at 20 ml/hr started yesterday, off currently for HIDA, plan to resume later if not extubated  Labs: potassium 3.2 (L) Meds: reviewed   Diet Order:   Diet Order            Diet NPO time specified Except for: Sips with Meds  Diet effective now              EDUCATION NEEDS:   Not appropriate for education at this time  Skin:  Skin Assessment: Reviewed RN Assessment  Last BM:  12/7  Height:   Ht Readings from Last 1 Encounters:  07/16/19 5\' 7"  (1.702 m)    Weight:   Wt Readings from Last 1 Encounters:  07/14/19 69.9 kg     Ideal Body Weight:  67.3 kg  BMI:  Body mass index is 24.14 kg/m.  Estimated Nutritional Needs:   Kcal:  1467 kcal  Protein:  105-120 grams  Fluid:  >/= 1.5 L/day   Kerman Passey MS, RDN, LDN, CNSC [phone] Pager  [phone] Weekend/On-Call Pager

## 2019-07-19 NOTE — Progress Notes (Signed)
eLink Physician-Brief Progress Note Patient Name: Riley Eaton DOB: [birth year] MRN: [id number]   Date of Service  07/19/2019  HPI/Events of Note  Patient request home Trazodone for sleep.   eICU Interventions  Will order: 1. Trazodone 50 mg PO X 1.      Intervention Category Major Interventions: Other:  Lysle Dingwall 07/19/2019, 11:12 PM

## 2019-07-19 NOTE — Progress Notes (Signed)
Progress Note  Patient Name: Riley Eaton Date of Encounter: 07/19/2019  Primary Cardiologist: Quay Burow, MD   Subjective   Pt oriented to name, denies chest pain (now or previously) w/ head shake. Gets agitated easily  Inpatient Medications    Scheduled Meds:  chlorhexidine gluconate (MEDLINE KIT)  15 mL Mouth Rinse BID   Chlorhexidine Gluconate Cloth  6 each Topical Daily   cloNIDine  0.1 mg Transdermal Weekly   enoxaparin (LOVENOX) injection  40 mg Subcutaneous Q24H   feeding supplement (VITAL HIGH PROTEIN)  1,000 mL Per Tube Q24H   mouth rinse  15 mL Mouth Rinse 10 times per day   metoprolol tartrate  10 mg Intravenous Q6H   naloxegol oxalate  25 mg Oral Daily   pantoprazole (PROTONIX) IV  40 mg Intravenous Q24H   potassium chloride  30 mEq Per Tube Q4H   Continuous Infusions:  acyclovir Stopped (07/19/19 0723)   ampicillin (OMNIPEN) IV 2 g (07/19/19 0824)   cefTRIAXone (ROCEPHIN)  IV Stopped (07/18/19 2245)   metronidazole Stopped (07/19/19 0339)   propofol (DIPRIVAN) infusion Stopped (07/18/19 0748)   vancomycin Stopped (07/19/19 0139)   PRN Meds: acetaminophen, docusate, fentaNYL (SUBLIMAZE) injection, fentaNYL (SUBLIMAZE) injection, haloperidol lactate, hydrALAZINE, LORazepam, morphine injection, naLOXone (NARCAN)  injection   Vital Signs    Vitals:   07/19/19 0313 07/19/19 0747 07/19/19 0800 07/19/19 0810  BP: (!) 154/98  126/80   Pulse: 79  65 78  Resp: _0 Temp:    99.6 F (37.6 C)  TempSrc:    Oral  SpO2: 100% 98% 100% 99%  Weight:      Height:        Intake/Output Summary (Last 24 hours) at 07/19/2019 0911 Last data filed at 07/19/2019 0800 Gross per 24 hour  Intake 3571.59 ml  Output 1610 ml  Net 1961.59 ml   Last 3 Weights 07/14/2019 02/01/2019 09/25/2018  Weight (lbs) 154 lb 1.6 oz 162 lb 158 lb  Weight (kg) 69.9 kg 73.483 kg 71.668 kg      Telemetry    SR, PVCs, PACs and PJCs - Personally Reviewed  ECG      07/16/2019 ECG is SR, HR 79, ST/T wave abnormalities anterolateral, inferior, QT/QTc is 504/577 - Personally Reviewed  Physical Exam   General: Well developed, elderly, male in no acute distress Head: Eyes PERRLA, Head normocephalic and atraumatic Lungs: few rales and rhonchi bilaterally to auscultation. Heart: HR slightly irreg, S1 S2, without rub or gallop. No murmur. 4/4 extremity pulses are 2+ & equal. No JVD. Abdomen: Bowel sounds are present, abdomen soft and non-tender without masses or  hernias noted. Msk: Not tested Extremities: No clubbing, cyanosis or edema.    Skin:  No rashes or lesions noted. Neuro: Alert and oriented X 1 (name)  Labs    High Sensitivity Troponin:   Recent Labs  Lab 07/14/19 1409 07/15/19 0841 07/15/19 1220 07/16/19 1251 07/18/19 0240  TROPONINIHS 974* 2,079* 2,110* 1,422* 289*      Chemistry Recent Labs  Lab 07/15/19 0319  07/16/19 0409 07/16/19 1357 07/18/19 0240 07/19/19 0302  NA 143   < > 146* 150* 148* 144  K 3.0*   < > 3.1* 3.0* 3.3* 3.2*  CL 110   < > 114*  --  116* 109  CO2 19*   < > 21*  --  22 25  GLUCOSE 127*   < > 131*  --  120* 148*  BUN 25*   < >  42*  --  29* 17  CREATININE 0.69   < > 0.82  --  0.79 0.62  CALCIUM 9.3   < > 9.4  --  8.3* 8.4*  PROT 6.9  --  6.8  --  5.9*  --   ALBUMIN 4.2  --  4.0  --  3.1*  --   AST 74*  --  62*  --  29  --   ALT 39  --  41  --  30  --   ALKPHOS 92  --  90  --  65  --   BILITOT 1.9*  --  1.4*  --  0.9  --   GFRNONAA >60   < > >60  --  >60 >60  GFRAA >60   < > >60  --  >60 >60  ANIONGAP 14   < > 11  --  10 10   < > = values in this interval not displayed.     Hematology Recent Labs  Lab 07/17/19 1632 07/18/19 0240 07/19/19 0302  WBC 6.2 7.1 9.3  RBC 3.47* 3.69* 3.69*  HGB 11.9* 12.5* 12.6*  HCT 33.6* 36.0* 34.7*  MCV 96.8 97.6 94.0  MCH 34.3* 33.9 34.1*  MCHC 35.4 34.7 36.3*  RDW 14.4 14.3 13.8  PLT 113* 122* 117*   Lab Results  Component Value Date   TRIG 101  07/19/2019   No results found for: HGBA1C   Lab Results  Component Value Date   TSH 0.389 07/16/2019     Radiology    Mr Brain Wo Contrast  Result Date: 07/17/2019 CLINICAL DATA:  78 year old male with recent unexplained altered mental status. Unresponsive this morning with periods of apnea. EXAM: MRI HEAD WITHOUT CONTRAST TECHNIQUE: Multiplanar, multiecho pulse sequences of the brain and surrounding structures were obtained without intravenous contrast. COMPARISON:  Head CT without contrast 08/11/2019. FINDINGS: Brain: No restricted diffusion to suggest acute infarction. No midline shift, mass effect, evidence of mass lesion, ventriculomegaly, extra-axial collection or acute intracranial hemorrhage. Cervicomedullary junction and pituitary are within normal limits. Cerebral volume is within normal limits for age. No cortical encephalomalacia or chronic cerebral blood products identified. 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 Other mild T2 heterogeneity in the deep gray nuclei is nonspecific, and not evident on DWI ir T1 weighted imaging. Negative brainstem and cerebellum. Minimal for age nonspecific cerebral white matter T2 and FLAIR hyperintensity. Vascular: Loss of the distal right vertebral artery flow void on series 10, image 1. Other Major intracranial vascular flow voids are preserved. Skull and upper cervical spine: Negative visible cervical spine. Visualized bone marrow signal is within normal limits. Sinuses/Orbits: Negative orbits. Trace paranasal sinus mucosal thickening. Other: Intubated. Small volume retained fluid in the pharynx. Trace mastoid fluid. Visible internal auditory structures appear normal. Small suboccipital benign scalp lipoma. IMPRESSION: 1.  No acute intracranial abnormality identified. 2. Mild nonspecific T2 and FLAIR hyperintensity in the deep gray nuclei, with a tiny chronic lacune in the left caudate. 3. Otherwise negative brain for age. Electronically  Signed   By: Genevie Ann M.D.   On: 07/17/2019 21:24   Ct Abdomen Pelvis W Contrast  Result Date: 07/18/2019 CLINICAL DATA:  [age]-year-old male with abdominal pain and intermittent fevers. Gallstone. EXAM: CT ABDOMEN AND PELVIS WITH CONTRAST TECHNIQUE: Multidetector CT imaging of the abdomen and pelvis was performed using the standard protocol following bolus administration of intravenous contrast. CONTRAST:  174m OMNIPAQUE IOHEXOL 300 MG/ML  SOLN COMPARISON:  CT  abdomen pelvis dated 07/12/2019. FINDINGS: Evaluation is limited due to streak artifact caused by patient's arms as well as due to respiratory motion artifact. Lower chest: Minimal bibasilar dependent atelectatic changes. Bilateral lower lobe streaky densities may represent atelectasis although developing infiltrate is not excluded. Clinical correlation is recommended. Multi vessel coronary vascular calcification primarily involving the LAD and left circumflex artery as well as involving the left main. No intra-abdominal free air or free fluid. Hepatobiliary: The liver is unremarkable. No intrahepatic biliary ductal dilatation. Small stones noted within the gallbladder. The gallbladder is mildly distended. No pericholecystic fluid or evidence of acute cholecystitis by CT. No calcified stone noted in the central CBD. Pancreas: There is fatty infiltration of the pancreas. No active inflammatory changes Spleen: Mild splenomegaly measuring 15 cm in craniocaudal length. Adrenals/Urinary Tract: The adrenal glands are unremarkable. There is no hydronephrosis on either side. There is symmetric enhancement and excretion of contrast by both kidneys. The visualized ureters appear unremarkable. The urinary bladder is predominantly decompressed around a Foley catheter. Minimal distension of the urinary bladder with excreted contrast. Mild trabeculated appearance of the bladder wall, likely related to chronic bladder outlet obstruction. Stomach/Bowel: Partially  visualized enteric tube with tip in the distal esophagus. There is a moderate size hiatal hernia containing a portion of the stomach. There is mild thickened appearance of the wall the stomach which may be related to underdistention. Mild gastritis is not excluded. Clinical correlation is recommended. There is no bowel obstruction. The appendix is normal. Vascular/Lymphatic: Mild aortoiliac atherosclerotic disease. The aorta is tortuous. No aneurysmal dilatation or dissection. The IVC is grossly unremarkable. No portal venous gas. There is no adenopathy. Reproductive: Enlarged prostate gland measuring approximately 6 cm in transverse axial diameter. The seminal vesicles are symmetric. Other: None Musculoskeletal: Osteopenia with scoliosis and degenerative changes of the spine. No acute osseous pathology. IMPRESSION: 1. Moderate size hiatal hernia containing a portion of the stomach. Mild thickened appearance of the wall of the stomach may be related to underdistention. Mild gastritis is not excluded. Clinical correlation is recommended. No bowel obstruction. Normal appendix. 2. Enteric tube with tip in the distal esophagus close to the GE junction within the hiatal hernia. 3. Cholelithiasis. 4. Mild splenomegaly. 5. Aortic Atherosclerosis (ICD10-I70.0). Electronically Signed   By: Anner Crete M.D.   On: 07/18/2019 15:35   Dg Chest Port 1 View  Result Date: 07/17/2019 CLINICAL DATA:  Orogastric tube placement EXAM: PORTABLE CHEST 1 VIEW COMPARISON:  Portable exam 0962 hours compared to 07/16/2019 Correlation: CT abdomen and pelvis 07/12/2019 FINDINGS: Tip of endotracheal tube projects 3.1 cm above carina. Tip of nasogastric tube projects over medial LEFT lower chest, by prior CT likely within previously seen hiatal hernia. Minimal enlargement of cardiac silhouette. Atherosclerotic calcification aorta. Subsegmental atelectasis LEFT base. Remaining lungs clear. No pleural effusion or pneumothorax. IMPRESSION:  Tip of orogastric tube projects over probable hiatal hernia. Subsegmental atelectasis LEFT lung base. Electronically Signed   By: Lavonia Dana M.D.   On: 07/17/2019 17:43    Cardiac Studies   ECHO: 07/14/2019  1. Technically difficult study. Left ventricular ejection fraction, by visual estimation, is 30 to 35%. The left ventricle has grossly severely decreased function. Images are not sufficient to assess for regional wall motion abnormalities. There is  mildly increased left ventricular hypertrophy.  2. Left ventricular diastolic parameters are consistent with Grade I diastolic dysfunction (impaired relaxation).  3. Global right ventricle has normal systolic function.The right ventricular size is normal.  4. The mitral  valve is normal in structure. No evidence of mitral valve regurgitation.  5. The tricuspid valve is not well visualized. Tricuspid valve regurgitation is not demonstrated.  6. The aortic valve was not well visualized. Aortic valve regurgitation is not visualized.  7. The pulmonic valve was not well visualized. Pulmonic valve regurgitation is not visualized.   Patient Profile     78 y.o. 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   Assessment & Plan    1. AMS, acute encephalopathy - initial presentation 12/01 for RUQ pain, N&V>> progressed to AMS and agitation - intubated on 12/04 - still problems w/ agitation - last fever was 12/06 am - no acute infection on CT abd/pelvis 12/06 - no clear cause of AMS (narcotic use is chronic, not clear that this was withdrawal) - for HIDA scan today  2. NSTEMI - unable to evaluate pt thoroughly due to sedation/AMS during this stay  - denies CP and does not remember every having any CP - EF 30-35% by echo, unknown WMA - ECG w/ upright  T waves on admit, by 12/03 they were inverted and much deeper on 12/04 - coronary calcifications on chest CT 09/2018 - peak trop 2110 w/ clear crescendo/decrescendo pattern - cath indicated, but not able to do 2nd sedation and it is not clear that we could do DAPT w/out interuption - add ASA, Lipitor 40 mg or 80 mg when ok w/ IM/CCM - change Lopressor 10 mg IV Q 6 hr to 50 mg q6 hr>>100 mg bid when able to get po meds. Continue IV for now.  Otherwise, per IM/CCM Active Problems:   Chronic low back pain   Choledocholithiasis   Acute metabolic encephalopathy   Mental status alteration   ACS (acute coronary syndrome) (HCC)   Endotracheal tube present   Alteration in nutrition   Encephalopathy    For questions or updates, please contact [facility] Please consult [email or web address] for contact info under        Signed, Rosaria Ferries, PA-C  07/19/2019, 9:11 AM

## 2019-07-19 NOTE — Procedures (Signed)
Extubation Procedure Note  Patient Details:   Name: Riley Eaton DOB: 1941-07-01 MRN: [id number]   Airway Documentation:    Vent end date: 07/19/19 Vent end time: 1707   Evaluation  O2 sats: stable throughout Complications: No apparent complications Patient did tolerate procedure well. Bilateral Breath Sounds: Diminished, Clear   Yes, pt could speak post-extubation.  Pt extubated to 2 l/m [place] per physician's order.  No adverse reactions noted.  Earney Navy 07/19/2019, 5:08 PM

## 2019-07-19 NOTE — Progress Notes (Signed)
Received pt in nuclear med on vent from unit RT, pt transported via vent back to ICU w/ no apparent complications.

## 2019-07-19 NOTE — Progress Notes (Signed)
Received call from radiology staff. Personnel states pt scheduled for hida scan today, need to keep pt npo for 6 hours prior to study and no narcotics for 6 hours prior to study. Npo status starte, this rn stopped vital high protein feeding. Radiology ersonnel states hida scan to be done today at 12 noon.

## 2019-07-20 ENCOUNTER — Inpatient Hospital Stay (HOSPITAL_COMMUNITY): Payer: Medicare Other

## 2019-07-20 DIAGNOSIS — J9601 Acute respiratory failure with hypoxia: Secondary | ICD-10-CM | POA: Diagnosis not present

## 2019-07-20 DIAGNOSIS — I639 Cerebral infarction, unspecified: Secondary | ICD-10-CM

## 2019-07-20 DIAGNOSIS — R1011 Right upper quadrant pain: Secondary | ICD-10-CM | POA: Diagnosis not present

## 2019-07-20 DIAGNOSIS — E876 Hypokalemia: Secondary | ICD-10-CM | POA: Diagnosis not present

## 2019-07-20 DIAGNOSIS — G9341 Metabolic encephalopathy: Secondary | ICD-10-CM | POA: Diagnosis not present

## 2019-07-20 LAB — CREATININE, SERUM
Creatinine, Ser: 0.6 mg/dL — ABNORMAL LOW (ref 0.61–1.24)
GFR calc Af Amer: >60 mL/min (ref >60)
GFR calc non Af Amer: >60 mL/min (ref >60)

## 2019-07-20 LAB — GLUCOSE, CAPILLARY
Glucose-Capillary: 107 mg/dL — ABNORMAL HIGH (ref 70–99)
Glucose-Capillary: 105 mg/dL — ABNORMAL HIGH (ref 70–99)
Glucose-Capillary: 107 mg/dL — ABNORMAL HIGH (ref 70–99)
Glucose-Capillary: 88 mg/dL (ref 70–99)
Glucose-Capillary: 100 mg/dL — ABNORMAL HIGH (ref 70–99)

## 2019-07-20 LAB — VANCOMYCIN, PEAK: Vancomycin Pk: 16 ug/mL — ABNORMAL LOW (ref 30–40)

## 2019-07-20 LAB — TRIGLYCERIDES: Triglycerides: 93 mg/dL (ref <150)

## 2019-07-20 LAB — VANCOMYCIN, TROUGH: Vancomycin Tr: 5 ug/mL — ABNORMAL LOW (ref 15–20)

## 2019-07-20 IMAGING — MR MR HEAD W/O CM
12 of 13 series · 44 of 48 positions shown · non-contrast
Comparison: Brain MRI [DATE], head CT [DATE]

CLINICAL DATA: Focal neuro deficit, greater than 6 hours, stroke
suspected.

EXAM:
MRI HEAD WITHOUT CONTRAST
TECHNIQUE: Multiplanar, multiecho pulse sequences of the brain and surrounding
structures were obtained without intravenous contrast.

[Series 5: DWI · axial · 3.0mm · 0.92mm/px · z∈[-105,+50]mm · 8 of 108 slices shown (1 of 4)]
[im 1/108]
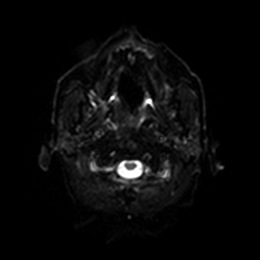
[im 16/108]
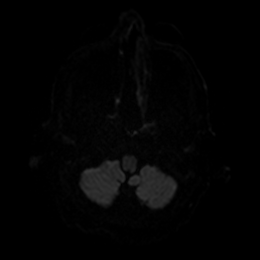
[im 31/108]
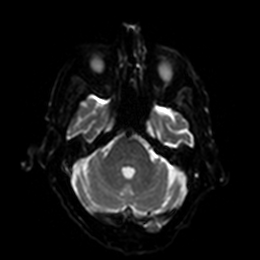
[im 46/108]
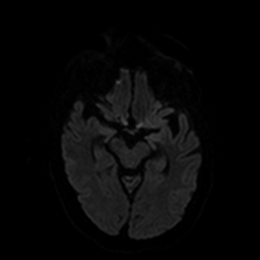
[im 62/108]
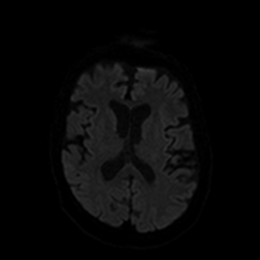
[im 77/108]
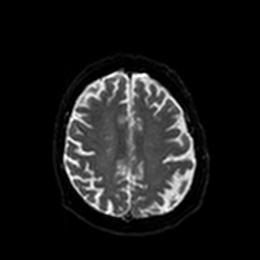
[im 92/108]
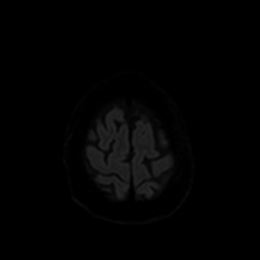
[im 108/108]
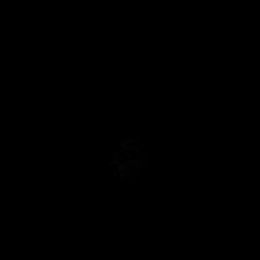

[Series 6: DWI · axial · 3.0mm · 0.92mm/px · z∈[-105,+50]mm · 4 of 54 slices shown (2 of 4)]
[im 1/54]
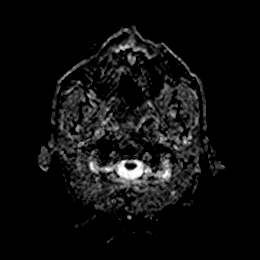
[im 18/54]
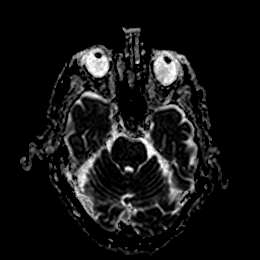
[im 36/54]
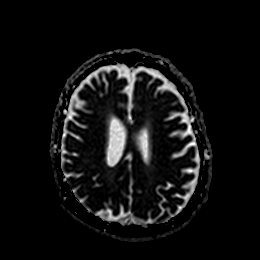
[im 54/54]
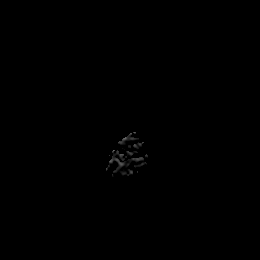

[Series 7: DWI · coronal · 4.0mm · 0.88mm/px · 6 of 78 slices shown (3 of 4)]
[im 1/78]
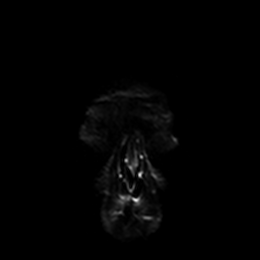
[im 16/78]
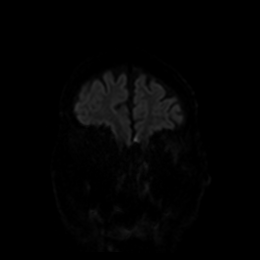
[im 31/78]
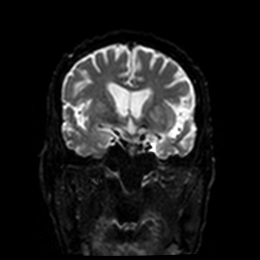
[im 47/78]
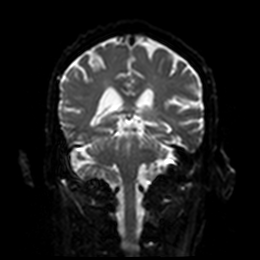
[im 62/78]
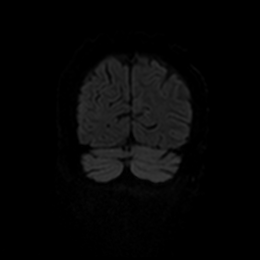
[im 78/78]
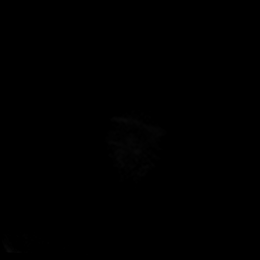

[Series 8: DWI · coronal · 4.0mm · 0.88mm/px · 3 of 39 slices shown (4 of 4)]
[im 1/39]
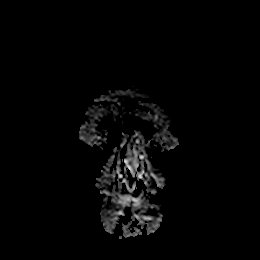
[im 20/39]
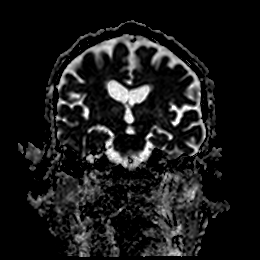
[im 39/39]
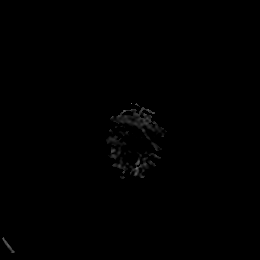

[Series 9: FLAIR · axial · 5.0mm · 0.47mm/px · z∈[-98,+53]mm · 2 of 27 slices shown]
[im 1/27]
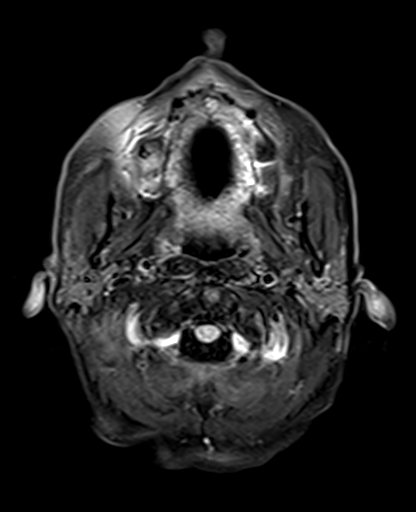
[im 27/27]
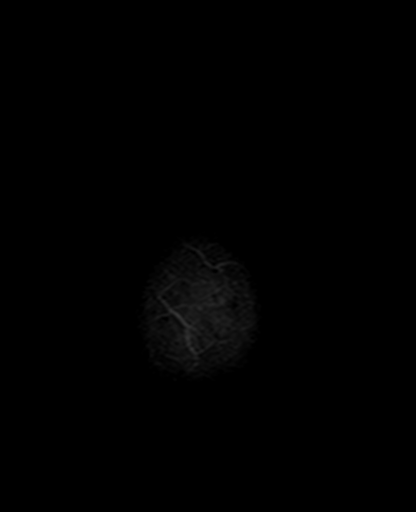

[Series 10: mag_images · axial · 3.0mm · 0.94mm/px · z∈[-97,+52]mm · 4 of 52 slices shown]
[im 1/52]
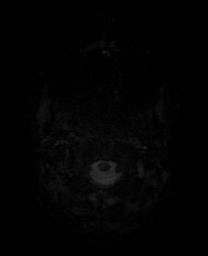
[im 18/52]
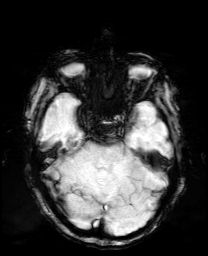
[im 35/52]
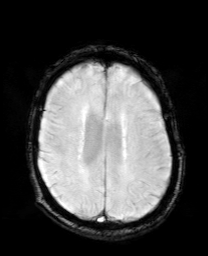
[im 52/52]
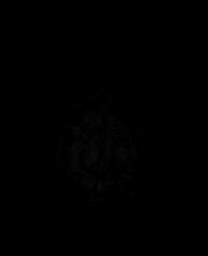

[Series 11: pha_images · axial · 3.0mm · 0.94mm/px · z∈[-97,+52]mm · 4 of 52 slices shown]
[im 1/52]
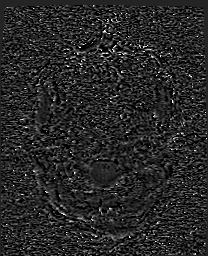
[im 18/52]
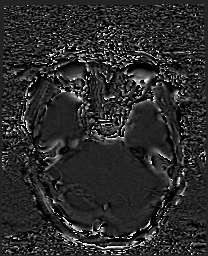
[im 35/52]
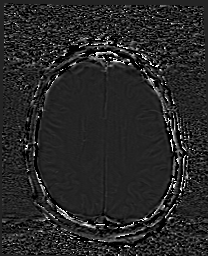
[im 52/52]
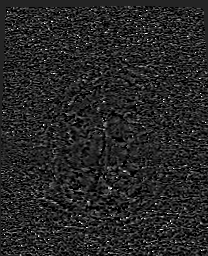

[Series 12: swi_images · axial · 3.0mm · 0.94mm/px · z∈[-97,+52]mm · 4 of 52 slices shown]
[im 1/52]
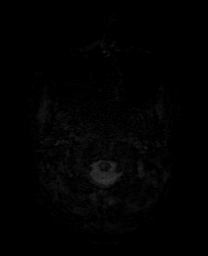
[im 18/52]
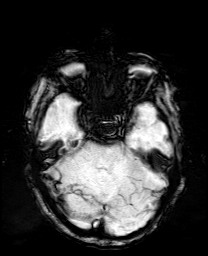
[im 35/52]
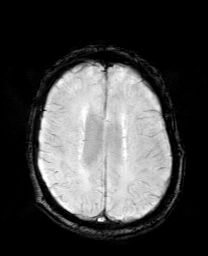
[im 52/52]
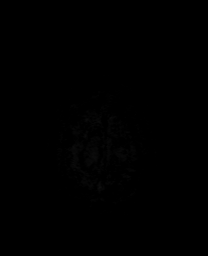

[Series 13: mip_images(sw) · axial · 24.0mm · 0.94mm/px · z∈[-86,+42]mm · 3 of 45 slices shown]
[im 1/45]
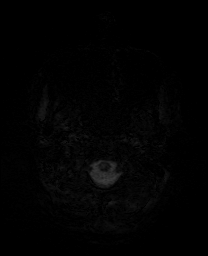
[im 23/45]
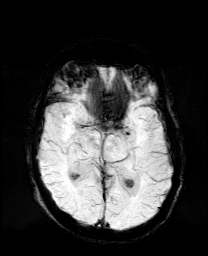
[im 45/45]
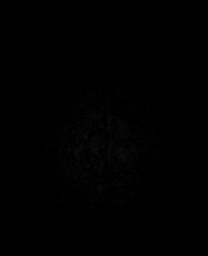

[Series 14: T1 · sagittal · 5.0mm · 0.75mm/px · 2 of 25 slices shown]
[im 1/25]
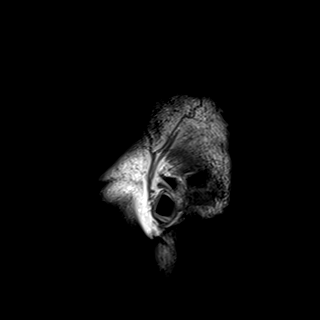
[im 25/25]
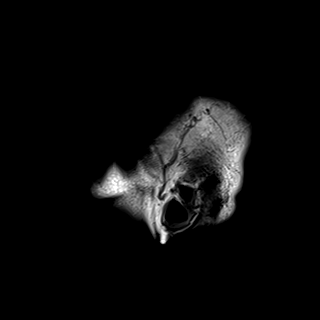

[Series 15: T2 · axial · 5.0mm · 0.75mm/px · z∈[-103,+48]mm · 2 of 27 slices shown (1 of 2)]
[im 1/27]
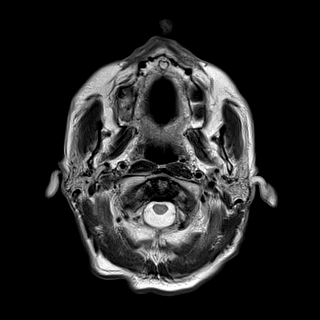
[im 27/27]
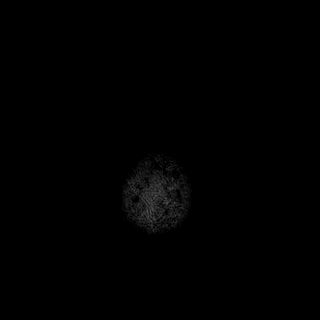

[Series 17: T2 · coronal · 5.0mm · 0.69mm/px · 2 of 32 slices shown (2 of 2)]
[im 1/32]
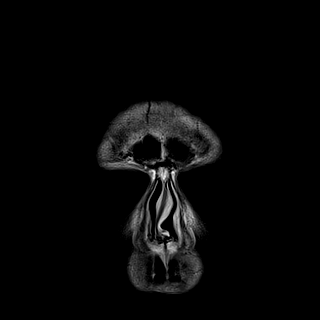
[im 32/32]
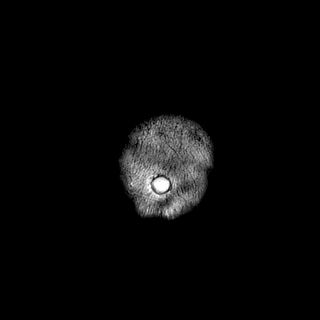

[44 of 48 positions shown; findings below may reference images not displayed]

FINDINGS: Brain:

There is no evidence of acute infarct.

No evidence of intracranial mass.

No midline shift or extra-axial fluid collection.

No chronic intracranial blood products.

Mild scattered T2/FLAIR hyperintensity within cerebral white matter
is nonspecific, but consistent with chronic small vessel ischemic
disease. Tiny chronic lacunar infarct within the left caudate.

Redemonstrated nonspecific T2/FLAIR hyperintensity within the deep
gray nuclei.

Mild generalized parenchymal atrophy.

Vascular: Flow voids maintained within the proximal large arterial
vessels.

Skull and upper cervical spine: No focal marrow lesion. 12 mm fat
density lesion within the midline occipital scalp likely reflecting
a lipoma.

Sinuses/Orbits: Visualized orbits demonstrate no acute abnormality.
Trace ethmoid sinus mucosal thickening. Bilateral mastoid effusions.
IMPRESSION: 1. No evidence of acute intracranial abnormality, including acute
infarct.
2. Mild generalized parenchymal atrophy and chronic small vessel
ischemic disease. Tiny chronic lacunar infarct in the left caudate.
3. Redemonstrated nonspecific T2/FLAIR hyperintensity within the
bilateral deep gray nuclei.
4. Bilateral mastoid effusions.

## 2019-07-20 MED ORDER — POTASSIUM CHLORIDE 10 MEQ/100ML IV SOLN
10.0000 meq | INTRAVENOUS | Status: AC
  Administered 2019-07-20 (×4): 10 meq via INTRAVENOUS
  Filled 2019-07-20 (×4): qty 100

## 2019-07-20 MED ORDER — SODIUM CHLORIDE 0.9 % IV SOLN: 2.0000 g | INTRAVENOUS | Status: DC

## 2019-07-20 MED ORDER — MELATONIN 3 MG PO TABS
3.0000 mg | ORAL_TABLET | Freq: Every evening | ORAL | Status: AC | PRN
  Administered 2019-07-21: 3 mg via ORAL
  Filled 2019-07-20 (×2): qty 1

## 2019-07-20 MED ORDER — VANCOMYCIN HCL 10 G IV SOLR
1500.0000 mg | Freq: Two times a day (BID) | INTRAVENOUS | Status: AC
  Administered 2019-07-21 – 2019-07-26 (×12): 1500 mg via INTRAVENOUS
  Filled 2019-07-20 (×14): qty 1500

## 2019-07-20 MED ORDER — MORPHINE SULFATE (PF) 2 MG/ML IV SOLN
2.0000 mg | INTRAVENOUS | Status: DC | PRN
  Administered 2019-07-24 – 2019-08-01 (×14): 2 mg via INTRAVENOUS
  Filled 2019-07-20 (×15): qty 1

## 2019-07-20 NOTE — Progress Notes (Signed)
During morning assessment pt noticed to have R sided weakness and R facial drooping. Patient in room with Physical Therapy, Physical therapy informed nurse pt was weaker on R side. Dr. Nelda Marseille notified and code stroke called.

## 2019-07-20 NOTE — Progress Notes (Signed)
Rehab Admissions Coordinator Note:  Per OT recommendation, this patient was screened by Raechel Ache for appropriateness for an Inpatient Acute Rehab Consult.  At this time, we are recommending Inpatient Rehab consult. AC will follow up with MD to request order.   Raechel Ache 07/20/2019, 12:53 PM  I can be reached at [phone].

## 2019-07-20 NOTE — Progress Notes (Signed)
Inpatient Rehabilitation Admissions Coordinator  Inpatient rehab consul received. I will follow up for consult tomorrow.  Danne Baxter, RN, MSN Rehab Admissions Coordinator [phone] 07/20/2019 5:29 PM

## 2019-07-20 NOTE — Plan of Care (Signed)
[facility] to pick up on 12/9.  See [facility] communication for further details. 

## 2019-07-20 NOTE — Consult Note (Addendum)
Referring Physician: Dr. Nelda Marseille    Chief Complaint: Right upper extremity weakness.   HPI: Riley Eaton is an 77 y.o. male with a PMHx of CAD, thoracic aortic aneurysm, chronic back pain, chronic opioid use who presented to [facility] on 12/1 with AMS and abdominal pain, with CT demonstrating cholelithiasis without cholecystitis; surgery had recommended outpatient follow up at that time. He was discharged home but re-presented on the same day with worsened pain and AMS. He was quite agitated with minimal response to Haldol, Dilaudid and Ativan.   MRI brain on 12/5 showed no acute intracranial abnormality. There was mild nonspecific T2 and FLAIR hyperintensity in the deep gray nuclei, with a tiny chronic lacune in the left caudate.   LSN: > 24 hours per RN, with exams while intubated and sedated showing bilateral grossly equal movement, but not sensitive enough to detect the subtle weakness seen on today's exam. Per RN, exam at about 5 PM yesterday showed some right sided weakness as well.  tPA Given: No: Out of time window.   Past Medical History:  Diagnosis Date  . Chronic back pain   . 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 Chronic, continuous use of opioids    for back pain  . Coronary artery calcification seen on CT scan   . Nerve pain   . Thoracic aortic aneurysm [facility])     Past Surgical History:  Procedure Laterality Date  . BACK SURGERY     09-2016  . back surgey    . ENDOSCOPIC RETROGRADE CHOLANGIOPANCREATOGRAPHY (ERCP) WITH PROPOFOL N/A 09/25/2018   Procedure: ENDOSCOPIC RETROGRADE CHOLANGIOPANCREATOGRAPHY (ERCP) WITH PROPOFOL;  Surgeon: Ronnette Juniper, MD;  Location: [facility];  Service: Gastroenterology;  Laterality: N/A;  . PANCREATIC STENT PLACEMENT  09/25/2018   Procedure: PANCREATIC STENT PLACEMENT;  Surgeon: Ronnette Juniper, MD;  Location: [place];  Service: Gastroenterology;;  . REMOVAL OF STONES  09/25/2018   Procedure: REMOVAL OF STONES;  Surgeon: Ronnette Juniper, MD;  Location:  [facility];  Service: Gastroenterology;;  . [name]  09/25/2018   Procedure: [name];  Surgeon: Ronnette Juniper, MD;  Location: [facility] ENDOSCOPY;  Service: Gastroenterology;;    Family History  Problem Relation Age of Onset  . Cancer Mother   . Heart attack Father    Social History:  reports that he has never smoked. He has never used smokeless tobacco. He reports that he does not drink alcohol or use drugs.  Allergies: No Known Allergies  Medications:  Scheduled: . aspirin  81 mg Per NG tube Daily  . Chlorhexidine Gluconate Cloth  6 each Topical Daily  . cloNIDine  0.1 mg Transdermal Weekly  . enoxaparin (LOVENOX) injection  40 mg Subcutaneous Q24H  . feeding supplement (VITAL HIGH PROTEIN)  1,000 mL Per Tube Q24H  . mouth rinse  15 mL Mouth Rinse BID  . metoprolol tartrate  10 mg Intravenous Q6H  . mupirocin ointment  1 application Nasal BID  . naloxegol oxalate  25 mg Oral Daily  . pantoprazole (PROTONIX) IV  40 mg Intravenous Q24H   Continuous: . potassium chloride    . vancomycin Stopped (07/20/19 0219)    ROS: The patient states that he does not feel weak and has no sensory problems. Other than difficulty clearing his throat, he does not endorse any symptoms.   Physical Examination: Blood pressure (!) 153/84, pulse 89, temperature 97.8 F (36.6 C), temperature source Oral, resp. rate (!) 21, height 5\' 7"  (1.702 m), weight 64.9 kg, SpO2 97 %.  HEENT: [place]/AT Lungs: Respirations unlabored Ext: No edema  Neurologic Examination: Mental Status:  Alert, fully oriented, with decreased spontaneous speech output, but able to answer all questions and follow all commands without difficulty. Speech fluent with intact comprehension. Cranial Nerves: II:  Visual fields intact with no extinction to DSS. PERRL.  III,IV, VI: No ptosis. EOMI without nystagmus.  V,VII: Smile symmetric, facial temp sensation equal bilaterally VIII: hearing intact to voice, but HOH IX,X:  Clearing secretions at time of exam XI: Tends to keep head in the flexed position.  XII: midline tongue extension  Motor: RUE with 4/5 grip and elbow flexion. Elevates above bed less briskly than on left.  LUE 5/5 RLE with subtle 4+/5 weakness on knee extension LLE 5/5 Sensory: Temp and light touch intact in upper and lower extremities.  Deep Tendon Reflexes:  2+ biceps and brachioradialis bilaterally 1+ patellae bilaterally  Toes tonically upgoing.  Cerebellar: No ataxia with FNF bilaterally. Slower on right than on left.  Gait: Deferred   Results for orders placed or performed during the hospital encounter of 07/13/19 (from the past 48 hour(s))  Ammonia     Status: Abnormal   Collection Time: 07/18/19  3:15 PM  Result Value Ref Range   Ammonia 40 (H) 9 - 35 umol/L    Comment: Performed at [facility], [place] [address], [place], [place] [address]  Glucose, capillary     Status: Abnormal   Collection Time: 07/18/19  3:28 PM  Result Value Ref Range   Glucose-Capillary 142 (H) 70 - 99 mg/dL  Glucose, capillary     Status: Abnormal   Collection Time: 07/18/19  9:23 PM  Result Value Ref Range   Glucose-Capillary 121 (H) 70 - 99 mg/dL   Comment 1 Notify RN   Glucose, capillary     Status: Abnormal   Collection Time: 07/19/19 12:33 AM  Result Value Ref Range   Glucose-Capillary 124 (H) 70 - 99 mg/dL   Comment 1 Notify RN   Triglycerides     Status: None   Collection Time: 07/19/19  3:02 AM  Result Value Ref Range   Triglycerides 101 <150 mg/dL    Comment: Performed at [facility], [place] [address], [place], [place] [address]  CBC with Differential     Status: Abnormal   Collection Time: 07/19/19  3:02 AM  Result Value Ref Range   WBC 9.3 4.0 - 10.5 K/uL   RBC 3.69 (L) 4.22 - 5.81 MIL/uL   Hemoglobin 12.6 (L) 13.0 - 17.0 g/dL   HCT 34.7 (L) 39.0 - 52.0 %   MCV 94.0 80.0 - 100.0 fL   MCH 34.1 (H) 26.0 - 34.0 pg   MCHC 36.3 (H) 30.0 - 36.0 g/dL   RDW 13.8 11.5 -  15.5 %   Platelets 117 (L) 150 - 400 K/uL    Comment: REPEATED TO VERIFY PLATELET COUNT CONFIRMED BY SMEAR SPECIMEN CHECKED FOR CLOTS    nRBC 0.0 0.0 - 0.2 %   Neutrophils Relative % 79 %   Neutro Abs 7.3 1.7 - 7.7 K/uL   Lymphocytes Relative 11 %   Lymphs Abs 1.1 0.7 - 4.0 K/uL   Monocytes Relative 8 %   Monocytes Absolute 0.8 0.1 - 1.0 K/uL   Eosinophils Relative 1 %   Eosinophils Absolute 0.1 0.0 - 0.5 K/uL   Basophils Relative 0 %   Basophils Absolute 0.0 0.0 - 0.1 K/uL   Immature Granulocytes 1 %   Abs  Immature Granulocytes 0.10 (H) 0.00 - 0.07 K/uL    Comment: Performed at [facility], [place] [address] [place], [place] [id number]  Basic metabolic panel     Status: Abnormal   Collection Time: 07/19/19  3:02 AM  Result Value Ref Range   Sodium 144 135 - 145 mmol/L   Potassium 3.2 (L) 3.5 - 5.1 mmol/L   Chloride 109 98 - 111 mmol/L   CO2 25 22 - 32 mmol/L   Glucose, Bld 148 (H) 70 - 99 mg/dL   BUN 17 8 - 23 mg/dL   Creatinine, Ser 0.62 0.61 - 1.24 mg/dL   Calcium 8.4 (L) 8.9 - 10.3 mg/dL   GFR calc non Af Amer >60 >60 mL/min   GFR calc Af Amer >60 >60 mL/min   Anion gap 10 5 - 15    Comment: Performed at [facility], [place] [address], [place], [place] [address]  Glucose, capillary     Status: Abnormal   Collection Time: 07/19/19  8:08 AM  Result Value Ref Range   Glucose-Capillary 132 (H) 70 - 99 mg/dL  MRSA PCR Screening     Status: Abnormal   Collection Time: 07/19/19  9:01 AM   Specimen: Nasal Mucosa; Nasopharyngeal  Result Value Ref Range   MRSA by PCR POSITIVE (A) NEGATIVE    Comment:        The GeneXpert MRSA Assay (FDA approved for NASAL specimens only), is one component of a comprehensive MRSA colonization surveillance program. It is not intended to diagnose MRSA infection nor to guide or monitor treatment for MRSA infections. RESULT CALLED TO, READ BACK BY AND VERIFIED WITH: Lattie Haw RN 11:10 07/19/19 ([email or web address]) Performed at [place], [place] [address] [place], [place] [address]   Glucose, capillary     Status: None   Collection Time: 07/19/19 11:41 AM  Result Value Ref Range   Glucose-Capillary 99 70 - 99 mg/dL  Glucose, capillary     Status: Abnormal   Collection Time: 07/19/19  3:17 PM  Result Value Ref Range   Glucose-Capillary 132 (H) 70 - 99 mg/dL  Glucose, capillary     Status: None   Collection Time: 07/19/19  8:33 PM  Result Value Ref Range   Glucose-Capillary 79 70 - 99 mg/dL  Glucose, capillary     Status: None   Collection Time: 07/19/19 11:32 PM  Result Value Ref Range   Glucose-Capillary 91 70 - 99 mg/dL  Creatinine, serum     Status: Abnormal   Collection Time: 07/20/19  3:11 AM  Result Value Ref Range   Creatinine, Ser 0.60 (L) 0.61 - 1.24 mg/dL   GFR calc non Af Amer >60 >60 mL/min   GFR calc Af Amer >60 >60 mL/min    Comment: Performed at [facility], [place] [address], [place],  [address]  Triglycerides     Status: None   Collection Time: 07/20/19  3:11 AM  Result Value Ref Range   Triglycerides 93 <150 mg/dL    Comment: Performed at [facility] [address], [place], [place] [address]  Glucose, capillary     Status: Abnormal   Collection Time: 07/20/19  4:35 AM  Result Value Ref Range   Glucose-Capillary 107 (H) 70 - 99 mg/dL  Glucose, capillary     Status: Abnormal   Collection Time: 07/20/19  7:23 AM  Result Value Ref Range   Glucose-Capillary 105 (H) 70 - 99 mg/dL  Nm Hepatobiliary Liver Func  Result Date: 07/19/2019 CLINICAL DATA:  Right upper quadrant pain evaluate for cholecystitis. EXAM: NUCLEAR MEDICINE HEPATOBILIARY IMAGING TECHNIQUE: Sequential images of the abdomen were obtained out to 60 minutes following intravenous administration of radiopharmaceutical. RADIOPHARMACEUTICALS:  5.09 mCi Tc-42m  Choletec IV COMPARISON:  CT AP 07/18/2019 FINDINGS: Prompt uptake and biliary excretion of activity by the liver is seen. Biliary activity passes into small bowel,  consistent with patent common bile duct. After 60 minutes of imaging no gallbladder activity visualized. The patient subsequently received 3 mg of morphine, IV. Imaging was carried out for an additional 30 minutes with subsequent visualization of the gallbladder reflecting patency of cystic duct. IMPRESSION: 1. Patent cystic duct without evidence for acute cholecystitis. 2. Delayed filling of the gallbladder following morphine administration which may be seen with chronic cholecystitis. Electronically Signed   By: Kerby Moors M.D.   On: 07/19/2019 15:00   Ct Abdomen Pelvis W Contrast  Result Date: 07/18/2019 CLINICAL DATA:  [age]-year-old male with abdominal pain and intermittent fevers. Gallstone. EXAM: CT ABDOMEN AND PELVIS WITH CONTRAST TECHNIQUE: Multidetector CT imaging of the abdomen and pelvis was performed using the standard protocol following bolus administration of intravenous contrast. CONTRAST:  161mL OMNIPAQUE IOHEXOL 300 MG/ML  SOLN COMPARISON:  CT abdomen pelvis dated 07/12/2019. FINDINGS: Evaluation is limited due to streak artifact caused by patient's arms as well as due to respiratory motion artifact. Lower chest: Minimal bibasilar dependent atelectatic changes. Bilateral lower lobe streaky densities may represent atelectasis although developing infiltrate is not excluded. Clinical correlation is recommended. Multi vessel coronary vascular calcification primarily involving the LAD and left circumflex artery as well as involving the left main. No intra-abdominal free air or free fluid. Hepatobiliary: The liver is unremarkable. No intrahepatic biliary ductal dilatation. Small stones noted within the gallbladder. The gallbladder is mildly distended. No pericholecystic fluid or evidence of acute cholecystitis by CT. No calcified stone noted in the central CBD. Pancreas: There is fatty infiltration of the pancreas. No active inflammatory changes Spleen: Mild splenomegaly measuring 15 cm in  craniocaudal length. Adrenals/Urinary Tract: The adrenal glands are unremarkable. There is no hydronephrosis on either side. There is symmetric enhancement and excretion of contrast by both kidneys. The visualized ureters appear unremarkable. The urinary bladder is predominantly decompressed around a Foley catheter. Minimal distension of the urinary bladder with excreted contrast. Mild trabeculated appearance of the bladder wall, likely related to chronic bladder outlet obstruction. Stomach/Bowel: Partially visualized enteric tube with tip in the distal esophagus. There is a moderate size hiatal hernia containing a portion of the stomach. There is mild thickened appearance of the wall the stomach which may be related to underdistention. Mild gastritis is not excluded. Clinical correlation is recommended. There is no bowel obstruction. The appendix is normal. Vascular/Lymphatic: Mild aortoiliac atherosclerotic disease. The aorta is tortuous. No aneurysmal dilatation or dissection. The IVC is grossly unremarkable. No portal venous gas. There is no adenopathy. Reproductive: Enlarged prostate gland measuring approximately 6 cm in transverse axial diameter. The seminal vesicles are symmetric. Other: None Musculoskeletal: Osteopenia with scoliosis and degenerative changes of the spine. No acute osseous pathology. IMPRESSION: 1. Moderate size hiatal hernia containing a portion of the stomach. Mild thickened appearance of the wall of the stomach may be related to underdistention. Mild gastritis is not excluded. Clinical correlation is recommended. No bowel obstruction. Normal appendix. 2. Enteric tube with tip in the distal esophagus close to the GE junction within the hiatal hernia. 3. Cholelithiasis. 4.  Mild splenomegaly. 5. Aortic Atherosclerosis (ICD10-I70.0). Electronically Signed   By: Anner Crete M.D.   On: 07/18/2019 15:35    Assessment: 78 y.o. male with possible worsening of RUE weakness noted by RN today.  Was noted to have some weakness of RUE yesterday as well. LKN > 24 hours while intubated and with limited neurological exams.  1. Not a tPA or endovascular candidate based on time criteria.  2. New weakness may be secondary to a small new left hemispheric stroke versus the old lacunar infarction seen on MRI brain from 12/5. [name] 3. Stroke Risk Factors - CAD  Plan: 1. MRI brain. If positive for acute stroke, obtain remainder of stroke work up.  2. Telemetry monitoring 3. Frequent neuro checks 4. Continue ASA  Addendum: MRI brain is negative for acute infarction.   Electronically signed: Dr. Kerney Elbe 07/20/2019, 10:18 AM

## 2019-07-20 NOTE — Progress Notes (Signed)
Per Infectious diease pt tracheal asp cultures positive for MRSA, pt placed on contact precaution.

## 2019-07-20 NOTE — Progress Notes (Signed)
NAME:  Riley Eaton, MRN:  [id number], DOB:  06-25-1941, LOS: 7 ADMISSION DATE:  07/13/2019, CONSULTATION DATE:  07/13/19 REFERRING MD:  Roderic Palau  CHIEF COMPLAINT:  AMS, Abd pain   Brief History   Riley Eaton is a 78 y.o. male who presented to ED 12/1 with abd pain and AMS.  Had been seen 1 day earlier and CT abd / pelv showed cholelithiasis without cholecystitis.  History of present illness   Riley Eaton is a 78 y.o. male who has a PMH including but not limited to thoracic aortic aneurysm, CAD, chronic back pain, chronic opioid use (see "past medical history" for rest).  He presented to [facility] ED 12/1 with AMS and abd pain.  He had been seen in [facility] ED 1 day prior for abd pain.  Had CT of the abd at the time that demonstrated cholelithiasis without cholecystitis.  Surgery was consulted and recommended outpatient follow up.  He was subsequently discharged home.  12/1 had worsening of pain along with AMS; therefore, came to [facility] ED.  He was quite agitated per reports and received 1mg  dilaudid, 5mg  haldol, 1mg  ativan x 3 and had minimal response.   PCCM was asked to see in consultation for consideration of precedex.  Per chart review, he has no underlying hx of EtOH abuse or substance abuse.  He does have hx of chronic low back pain and chronic opioid use.  On review of outpatient med list, he had 5-325 norco prescribed for q6hrs PRN, 40mg  oxycontin TID, lyrica 200mg  TID, duloxetine 60mg  BID.  Per chart review, he had admission 09/24/18 through 10/06/18 for acute ascending cholangitis s/p ERCP and sphincterotomy with stenting of pancreatic duct (Dr. Therisa Doyne).  During that admission, he did have severe delirium felt to be due to narcotic withdrawal.  He did spend a few days in ICU and was briefly treated with precedex and fentanyl.  He was discharged and supposed to be evaluated by surgery as an outpatient for cholecystectomy; however, this was postponed due to the [place] pandemic.  Per discharge  summary, his medication regimen was adjusted prior to d/c and pt had been tolerating it well (oxycodone weaned from 80mg  BID to BID, lyrica reduced from 200mg  TID to 100mg  TID, cymbalta from 60mg  BID to 60mg  daily).  Past Medical History  has Thoracic aortic aneurysm ([place]); Coronary artery calcification seen on CT scan; Chronic low back pain; Nausea & vomiting; Choledocholithiasis; Chronic pain; Intractable vomiting; Urinary retention; Constipation; Protein-calorie malnutrition, severe; Acute metabolic encephalopathy; Mental status alteration; ACS (acute coronary syndrome) ([place]); Endotracheal tube present; Alteration in nutrition; Encephalopathy; and Abdominal pain on their problem list.  Significant Hospital Events   12/1 > admit. 12/4: worsening mental status and resp status unable to protect airway, tachypneic but sats are maintaining on [place] at this time. CCM reconsulted.   Consults:  12/1 PCCM. 12/2: cards  Procedures:  None.  Significant Diagnostic Tests:  CT A / P 11/30 > cholelithiasis, no cholecystitis.  Borderline splenomegaly.  Large hiatal hernia. CT head 12/3 >Significant motion limitations. No evidence of acute abnormality. Consider repeat exam based on clinical concern when patient is able to hold still. RUQ 12/1: 1. Biliary sludge and biliary sludge ball in the gallbladder. No findings to suggest an acute cholecystitis at this time. Echo 12/2: LVEF [id number], Grade I diastolic dysfunction [id number] MRI brain > No acute intracranial abnormality identified. Mild nonspecific T2 and FLAIR hyperintensity in the deep gray nuclei, with a tiny chronic lacune in  the left caudate. CT abdomen 12/6 > Moderate size hiatal hernia containing a portion of the stomach. Enteric tube with tip in the distal esophagus close to the GE junction within the hiatal hernia. Cholelithiasis. HIDA scan 12/7 with chronic cholecystitis but no obstruction  Micro Data:  Blood 12/1 > ngtd SARS CoV2 12/1 >  NEGATIVE Sputum 12/6 > staph aureus few >>>  Antimicrobials:  Ampicillin 12/4 >12/6 Acyclovir 12/4 >12/6 Ceftriaxone 12/1 > 12/8 Flagyl 12/1 > 12/8 Vancomycin 12/4 >12/14  Interim history/subjective:  Tolerated extubation overnight Intermittent confusion but no new complaints  Objective:  Blood pressure (!) 153/84, pulse 89, temperature 97.8 F (36.6 C), temperature source Oral, resp. rate (!) 21, height 5\' 7"  (1.702 m), weight 64.9 kg, SpO2 97 %.    Vent Mode: PSV;CPAP FiO2 (%):  [28 %-40 %] 28 % PEEP:  [5 cmH20] 5 cmH20 Pressure Support:  [5 cmH20] 5 cmH20   Intake/Output Summary (Last 24 hours) at 07/20/2019 0908 Last data filed at 07/20/2019 0700 Gross per 24 hour  Intake 699.76 ml  Output 650 ml  Net 49.76 ml   Filed Weights   07/14/19 2046 07/20/19 0500  Weight: 69.9 kg 64.9 kg   Examination: General: Elderly chronically ill appearing male, NAD HEENT: [place]/AT, PERRL, EOM-I and MMM Cardiovascular: RRR, Nl S1/S2 and -M/R/G Lungs: CTA bilaterally Abdomen: Soft, NT, ND and +BS Musculoskeletal: No gross deformities, no edema.  Skin: Thin but intact Neuro: Alert, intermittently confused but oriented to self, place and time, moving all ext to command  I reviewed CXR myself, noa cute disease noted  Discussed with PCCM-NP and TRH-MD  Assessment & Plan:   Acute encephalopathy:- CTH and MRI negative. TSH and ammonia don't seem to be responsible. Lactic and PCT wnl. Less likely opioid withdrawal as fentanyl patch present on arrival and family controls opioids at home. LP deferred due to clinical improvement.  - Minimize narcotic use - Change morphine to 2 mg q3 hours PRN for pain from q1 hour for agitation - D/C fentanyl - May need to restart fentanyl patch but will hold off for now since extubation <24 hours  Acute resp failure: -intubated for airway protection;  - Titrate O2 for sat of 88-92% - SLP - OOB - PT/OT - MRSA in sputum, will continue for 14 days  Abd  pain - unclear etiology; though, concerning for cholecystitis.  Had ERCP with sphincterotomy and pancreatic duct stenting on 09/25/18. CT x 2 shows only cholelithiasis but no active disease. Stomach pain has been exquisite up until this morning.  - D/C rocephin and flagyl given HIDA scan findings - Doubt will need MRCP at this point since no obstruction but evidence of chronic cholecystitis  Prolonged qt:  - Avoid prolonging agents - Recheck prn - Insure electrolytes are normal  Elevated trop/NSTEMI: - Trop to >2K, now trending down - Per cardiology. 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 - Replete electrolytes as indicated - SLP to start diet  Transfer patient to medical tele and to [facility] service with PCCM off 12/9  Best Practice:  Diet:  NPO for HIDA Pain/Anxiety/Delirium protocol (if indicated): PRN sedation VAP protocol (if indicated): N/A. DVT prophylaxis: Lovenox GI prophylaxis: PPI Glucose control: CBG Mobility: BR Code Status: Full Family Communication: Daughter updated 12/7 Disposition: ICU  Labs  CBC: Recent Labs  Lab 07/15/19 0319 07/16/19 0409 07/16/19 1357 07/17/19 1632 07/18/19 0240 07/19/19 0302  WBC 12.6* 10.4  --  6.2 7.1 9.3  NEUTROABS 10.7* 8.6*  --  4.9 5.5 7.3  HGB 14.5 14.7 11.2* 11.9* 12.5* 12.6*  HCT 39.2 40.1 33.0* 33.6* 36.0* 34.7*  MCV 91.8 92.4  --  96.8 97.6 94.0  PLT 138* 157  --  113* 122* [id number]*   Basic Metabolic Panel: Recent Labs  Lab 07/15/19 0319 07/15/19 1220 07/15/19 1438 07/16/19 0409 07/16/19 1357 07/18/19 0240 07/19/19 0302 07/20/19 0311  NA 143  --  147* 146* 150* 148* 144  --   K 3.0*  --  4.0 3.1* 3.0* 3.3* 3.2*  --   CL 110  --  111 114*  --  116* 109  --   CO2 19*  --  22 21*  --  22 25  --   GLUCOSE 127*  --  137* 131*  --  120* 148*  --    BUN 25*  --  33* 42*  --  29* 17  --   CREATININE 0.69  --  0.78 0.82  --  0.79 0.62 0.60*  CALCIUM 9.3  --  9.7 9.4  --  8.3* 8.4*  --   MG  --  2.2  --   --   --   --   --   --    GFR: Estimated Creatinine Clearance: 68.7 mL/min (A) (by C-G formula based on SCr of 0.6 mg/dL (L)). Recent Labs  Lab 07/13/19 1500 07/13/19 1845 07/14/19 0757  07/15/19 1809 07/15/19 2113 07/16/19 0409 07/17/19 1632 07/18/19 0240 07/19/19 0302  PROCALCITON  --   --  <0.10  --   --   --   --   --   --   --   WBC  --   --  8.1   < >  --   --  10.4 6.2 7.1 9.3  LATICACIDVEN 3.4* 1.2  --   --  1.3 1.0  --   --   --   --    < > = values in this interval not displayed.   Liver Function Tests: Recent Labs  Lab 07/13/19 1440 07/14/19 0757 07/15/19 0319 07/16/19 0409 07/18/19 0240  AST 42* 54*   55* 74* 62* 29  ALT 21 29   28  39 41 30  ALKPHOS 93 91   87 92 90 65  BILITOT 2.1* 1.7*   1.5* 1.9* 1.4* 0.9  PROT 7.6 7.1   7.0 6.9 6.8 5.9*  ALBUMIN 4.5 4.2   4.2 4.2 4.0 3.1*   Recent Labs  Lab 07/13/19 1440  LIPASE 18   Recent Labs  Lab 07/16/19 1130 07/18/19 1515  AMMONIA 37* 40*   ABG    Component Value Date/Time   PHART 7.408 07/16/2019 1357   PCO2ART 31.9 (L) 07/16/2019 1357   PO2ART 294.0 (H) 07/16/2019 1357   HCO3 20.3 07/16/2019 1357   TCO2 21 (L) 07/16/2019 1357   ACIDBASEDEF 4.0 (H) 07/16/2019 1357   O2SAT 100.0 07/16/2019 1357    Coagulation Profile: No results for input(s): INR, PROTIME in the last 168 hours. Cardiac Enzymes: No results for input(s): CKTOTAL, CKMB, CKMBINDEX, TROPONINI in the last 168 hours. HbA1C: No results found for: HGBA1C CBG: Recent Labs  Lab 07/19/19 1517 07/19/19 2033 07/19/19 2332 07/20/19 0435 07/20/19 0723  GLUCAP 132* 79 91 107* 105*   Rush Farmer, M.D. [facility]  Pulmonary/Critical Care Medicine.

## 2019-07-20 NOTE — Progress Notes (Signed)
Pharmacy Antibiotic Note  Riley Eaton is a 78 y.o. male admitted on 07/13/2019 and today with worse mental and respiratory status requiring intubation. Initially concerned for CNS infection however now treating for MRSA PNA - pharmacy consulted for Vancomycin dosing.   A Vancomycin peak drawn earlier today was 16 mcg/ml (true 17.5 mcg/ml) and trough was 5 mcg/ml (true 4.6 mcg/ml) for an estimated AUC of 234 which is below goal. Will increase to 1500 mg/12h for an estimated AUC of 467.   Plan: - Adjust Vancomycin to 1500 mg IV every 12 hours (est AUC 467) - Will continue to follow renal function, culture results, LOT, and antibiotic de-escalation plans   Height: 5\' 7"  (170.2 cm) Weight: 143 lb 1.3 oz (64.9 kg) IBW/kg (Calculated) : 66.1  Temp (24hrs), Avg:97.9 F (36.6 C), Min:97.7 F (36.5 C), Max:98.5 F (36.9 C)  Recent Labs  Lab 07/13/19 1845  07/15/19 0319 07/15/19 1438 07/15/19 1809 07/15/19 2113 07/16/19 0409 07/17/19 1632 07/18/19 0240 07/19/19 0302 07/20/19 0311 07/20/19 1224 07/20/19 1452  WBC  --    < > 12.6*  --   --   --  10.4 6.2 7.1 9.3  --   --   --   CREATININE  --    < > 0.69 0.78  --   --  0.82  --  0.79 0.62 0.60*  --   --   LATICACIDVEN 1.2  --   --   --  1.3 1.0  --   --   --   --   --   --   --   VANCOTROUGH  --   --   --   --   --   --   --   --   --   --   --  5*  --   VANCOPEAK  --   --   --   --   --   --   --   --   --   --   --   --  16*   < > = values in this interval not displayed.    Estimated Creatinine Clearance: 68.7 mL/min (A) (by C-G formula based on SCr of 0.6 mg/dL (L)).    No Known Allergies  12/1 ctx >>12/8 12/4 vanc>> 12/14 12/4 amp>>12/7 12/4 acyc>>12/7 12/1 flagyl>>12/8  12/8: VP 16, VT 5 - calc AUC 234 on 750 mg/12h >> adj to 1500 mg/12h  12/1 covid neg 12/1 BCx: negative 12/4 HSV: sent 12/4 LP:  12/6 BCx: sent 12/6 TA: few MRSA 12/6 UCx: negative 12/7 MRSA PCR positive  Thank you for allowing pharmacy to be  a part of this patient's care.  Alycia Rossetti, PharmD, BCPS Clinical Pharmacist Clinical phone for 07/20/2019: [phone] 07/20/2019 4:11 PM   **Pharmacist phone directory can now be found on [email or web address] ([email or web address]).  Listed under [place].

## 2019-07-20 NOTE — Progress Notes (Signed)
eLink Physician-Brief Progress Note Patient Name: Riley Eaton DOB: [birth year] MRN: [id number]   Date of Service  07/20/2019  HPI/Events of Note  Pt asking for something for sleep.  eICU Interventions  Melatonin 3 mg po HS PRN x 2 days ordered.        [name] [name] 07/20/2019, 11:02 PM

## 2019-07-20 NOTE — Significant Event (Signed)
Rapid Response Event Note  Overview: Time Called: [id number] Arrival Time: 1000 Event Type: Neurologic  Initial Focused Assessment: Codes stroke called at [id number] for patient having right sided weakness. Upon my assessment, patient does appear to have a right sided facial droop at rest and is favoring use of his left extremities, although he states he is right handed. NIHSS completed and patient is able to move all extremities and follow commands. Facial expressions appear symmetrical. [place] unknown, as patient was ventilated  and sedated until yesterday and was noted to have right sided weakness yesterday at 5pm.   Interventions: Dr. Cheral Marker at bedside, performing assessment. Code stroke process cancelled and MRI ordered by Dr. Cheral Marker due to [facility] being unknown. NIH competed and documented in flowsheet. NIHSS total: 1. SLP evaluation ordered, pt to remain NPO at this time.  Plan of Care (if not transferred):  Event Summary:   Riley Eaton

## 2019-07-20 NOTE — Evaluation (Signed)
Occupational Therapy Evaluation Patient Details Name: Riley Eaton MRN: [id number] DOB: [birth year] Today's Date: 07/20/2019    History of Present Illness Kendrik R Turi is a 78 y.o. male who presented to ED 12/1 with abd pain and AMS.  Had been seen 1 day earlier and CT abd / pelv showed cholelithiasis without cholecystitis. Intubated 12/4-12/7. PHMx: Thoracic aortic aneurysm; Chronic low back pain; Choledocholithiasis; Chronic pain; Acute metabolic encephalopathy; ACS. 12/3 CT head: Significant motion limitations. No evidence of acute abnormality   Clinical Impression   This 78 yo male admitted with above presents to acute OT with right inattention, decreased movement on RUE compared to LUE, decreased ability to track, decreased mobility, decreased sitting/standing balance, decreased safety awareness all affecting his PLOF per his report of being independent with basic ADLs. He will benefit from acute OT with follow up OT on CIR.    Follow Up Recommendations  CIR;Supervision/Assistance - 24 hour    Equipment Recommendations  Other (comment)(TBD at next venue)       Precautions / Restrictions Precautions Precautions: Fall Precaution Comments: right inattention Restrictions Weight Bearing Restrictions: No      Mobility Bed Mobility Overal bed mobility: Needs Assistance Bed Mobility: Supine to Sit     Supine to sit: Mod assist;+2 for physical assistance;+2 for safety/equipment;HOB elevated     General bed mobility comments: Pt started to bring his legs off of the bed (got half way there) and then struggled with getting the rest of the way up even with verbal cues and A for hand placement on bed rail  Transfers Overall transfer level: Needs assistance Equipment used: 4-wheeled walker;2 person hand held assist Transfers: Sit to/from [employer] Sit to Stand: +2 physical assistance;Max assist   Squat pivot transfers: +2 physical assistance;Max assist      General transfer comment: Attempted to use rollator with him but he was unable to achieve full upright posture and not using RUE on handle very well so switched to Bil HHA for squat pivot to his left to get to recliner    Balance Overall balance assessment: Needs assistance Sitting-balance support: Single extremity supported;Feet supported Sitting balance-Leahy Scale: Poor Sitting balance - Comments: right lateral lean   Standing balance support: Bilateral upper extremity supported Standing balance-Leahy Scale: Zero Standing balance comment: unable to achieve full upright posture                           ADL either performed or assessed with clinical judgement   ADL Overall ADL's : Needs assistance/impaired Eating/Feeding: NPO   Grooming: Moderate assistance Grooming Details (indicate cue type and reason): supported sitting Upper Body Bathing: Moderate assistance Upper Body Bathing Details (indicate cue type and reason): supported sitting Lower Body Bathing: Maximal assistance Lower Body Bathing Details (indicate cue type and reason): Max A +2 partial stand Upper Body Dressing : Maximal assistance Upper Body Dressing Details (indicate cue type and reason): supported sitting Lower Body Dressing: Total assistance Lower Body Dressing Details (indicate cue type and reason): Max A +2 partial stand                     Vision Baseline Vision/History: No visual deficits Additional Comments: Pt was able to find pen held up in different fields, but unable to follow for tracking espcially at end ranges on both sides            Pertinent Vitals/Pain Pain Assessment: No/denies pain  Hand Dominance Right   Extremity/Trunk Assessment Upper Extremity Assessment Upper Extremity Assessment: RUE deficits/detail RUE Deficits / Details: Slowness of movement, not automatically using it, P/AAROM WNL RUE Coordination: decreased fine motor;decreased gross motor            Communication Communication Communication: (slow to respond at times but then others normal time frame)   Cognition Arousal/Alertness: Awake/alert Behavior During Therapy: WFL for tasks assessed/performed Overall Cognitive Status: Impaired/Different from baseline Area of Impairment: Orientation;Following commands;Safety/judgement;Problem solving                 Orientation Level: Disoriented to;Time(November and increased time to come up with year)     Following Commands: Follows one step commands inconsistently;Follows one step commands with increased time Safety/Judgement: Decreased awareness of safety;Decreased awareness of deficits   Problem Solving: Slow processing;Decreased initiation;Difficulty sequencing;Requires verbal cues;Requires tactile cues General Comments: While supine in bed he was talking and moving well, once he sat up on EOB he became more quite and had dry heaves (all vitals good) and leaning to right, once in chair more talkative again and could tell us where he was and what his kids names are              Bernice expects to be discharged to:: Private residence Living Arrangements: Spouse/significant other Available Help at Discharge: Family;Available 24 hours/day Type of Home: House Home Access: Stairs to enter [employer] of Steps: 4 Entrance Stairs-Rails: Right Home Layout: One level     Bathroom Shower/Tub: Tub/shower unit;Walk-in shower         Home Equipment: Gilford Rile - 2 wheels;Cane - single point;Shower seat          Prior Functioning/Environment Level of Independence: Independent with assistive device(s)        Comments: PLOF and home taken from previous chart (9 months ago)        OT Problem List: Decreased strength;Decreased range of motion;Impaired balance (sitting and/or standing);Decreased activity tolerance;Impaired vision/perception;Decreased coordination;Decreased cognition;Decreased  safety awareness;Decreased knowledge of use of DME or AE;Impaired UE functional use      OT Treatment/Interventions: Self-care/ADL training;DME and/or AE instruction;Patient/family education;Balance training;Cognitive remediation/compensation;Visual/perceptual remediation/compensation    OT Goals(Current goals can be found in the care plan section) Acute Rehab OT Goals Patient Stated Goal: to be able to go home OT Goal Formulation: With patient Time For Goal Achievement: 08/03/19 Potential to Achieve Goals: Good  OT Frequency: Min 2X/week           Co-evaluation PT/OT/SLP Co-Evaluation/Treatment: Yes Reason for Co-Treatment: For patient/therapist safety;To address functional/ADL transfers PT goals addressed during session: Mobility/safety with mobility;Balance;Proper use of DME;Strengthening/ROM OT goals addressed during session: Strengthening/ROM;ADL's and self-care      AM-PAC OT "6 Clicks" Daily Activity     Outcome Measure Help from another person eating meals?: Total Help from another person taking care of personal grooming?: A Lot Help from another person toileting, which includes using toliet, bedpan, or urinal?: Total Help from another person bathing (including washing, rinsing, drying)?: A Lot Help from another person to put on and taking off regular upper body clothing?: Total Help from another person to put on and taking off regular lower body clothing?: Total 6 Click Score: 8   End of Session Equipment Utilized During Treatment: Gait belt;Rolling walker Nurse Communication: Mobility status(+2 squat pivot recliner next to bed), pt with inattention to RUE and right lateral lean during therapy.  Activity Tolerance: Patient limited by lethargy(when we sat up) Patient left: in  chair;with call bell/phone within reach;with chair alarm set  OT Visit Diagnosis: Unsteadiness on feet (R26.81);Other abnormalities of gait and mobility (R26.89);Muscle weakness (generalized)  (M62.81);Low vision, both eyes (H54.2);Other symptoms and signs involving cognitive function;Hemiplegia and hemiparesis Hemiplegia - Right/Left: Right Hemiplegia - dominant/non-dominant: Dominant Hemiplegia - caused by: Unspecified                Time: 0902-0927 OT Time Calculation (min): 25 min Charges:  OT General Charges $OT Visit: 1 Visit OT Evaluation $OT Eval Moderate Complexity: 1 Mod    Almon Register 07/20/2019, 11:29 AM

## 2019-07-20 NOTE — Evaluation (Signed)
Physical Therapy Evaluation Patient Details Name: Riley Eaton MRN: [id number] DOB: 1941-03-16 Today's Date: 07/20/2019   History of Present Illness  Riley Eaton is a 78 y.o. male who presented to ED 12/1 with abd pain and AMS.  Had been seen 1 day earlier and CT abd / pelv showed cholelithiasis without cholecystitis. Intubated 12/4-12/7. PHMx: Thoracic aortic aneurysm; Chronic low back pain; Choledocholithiasis; Chronic pain; Acute metabolic encephalopathy; ACS. 12/3 CT head: Significant motion limitations. No evidence of acute abnormality  Clinical Impression  Pt admitted with above diagnosis and presents to PT with functional limitations due to deficits listed below (See PT problem list). Pt needs skilled PT to maximize independence and safety to allow discharge to CIR prior to return home with wife.      Follow Up Recommendations CIR;Supervision/Assistance - 24 hour    Equipment Recommendations  Other (comment)(To be determined)    Recommendations for Other Services       Precautions / Restrictions Precautions Precautions: Fall Precaution Comments: right inattention Restrictions Weight Bearing Restrictions: No      Mobility  Bed Mobility Overal bed mobility: Needs Assistance Bed Mobility: Supine to Sit     Supine to sit: Mod assist;+2 for physical assistance;+2 for safety/equipment;HOB elevated     General bed mobility comments: Pt started to bring his legs off of the bed (got half way there) and then struggled with getting the rest of the way up even with verbal cues and A for hand placement on bed rail  Transfers Overall transfer level: Needs assistance Equipment used: 4-wheeled walker;2 person hand held assist Transfers: Sit to/from [employer] Sit to Stand: +2 physical assistance;Max assist   Squat pivot transfers: +2 physical assistance;Max assist     General transfer comment: Assist to bring hips up and for balance but only able to  achieve partial stand. Initially attempted with rollator to try and stand pivot to chair but unable to fully stand. Switched to Bil hand held and able to squat pivot to chair.   Ambulation/Gait             General Gait Details: Unable  [employer] (Stroke Patients Only)       Balance Overall balance assessment: Needs assistance Sitting-balance support: Single extremity supported;Feet supported Sitting balance-Leahy Scale: Poor Sitting balance - Comments: right lateral lean   Standing balance support: Bilateral upper extremity supported Standing balance-Leahy Scale: Zero Standing balance comment: unable to achieve full upright posture                             Pertinent Vitals/Pain Pain Assessment: No/denies pain    Home Living Family/patient expects to be discharged to:: Private residence Living Arrangements: Spouse/significant other Available Help at Discharge: Family;Available 24 hours/day Type of Home: House Home Access: Stairs to enter Entrance Stairs-Rails: Right Entrance Stairs-Number of Steps: 4 Home Layout: One level Home Equipment: Walker - 2 wheels;Cane - single point;Shower seat      Prior Function Level of Independence: Independent with assistive device(s)         Comments: PLOF and home taken from previous chart (9 months ago)     Hand Dominance   Dominant Hand: Right    Extremity/Trunk Assessment   Upper Extremity Assessment Upper Extremity Assessment: Defer to OT evaluation    Lower Extremity Assessment Lower Extremity Assessment: Generalized  weakness       Communication   Communication: (slow to respond at times but then others normal time frame)  Cognition Arousal/Alertness: Awake/alert Behavior During Therapy: WFL for tasks assessed/performed Overall Cognitive Status: Impaired/Different from baseline Area of Impairment: Orientation;Following  commands;Safety/judgement;Problem solving                 Orientation Level: Disoriented to;Time(November and increased time to come up with year)     Following Commands: Follows one step commands inconsistently;Follows one step commands with increased time Safety/Judgement: Decreased awareness of safety;Decreased awareness of deficits   Problem Solving: Slow processing;Decreased initiation;Difficulty sequencing;Requires verbal cues;Requires tactile cues General Comments: While supine in bed he was talking and moving well, once he sat up on EOB he became more quite and had dry heaves (all vitals good) and leaning to right, once in chair more talkative again and could tell us where he was and what his kids names are      General Comments General comments (skin integrity, edema, etc.): VSS. BP running high    Exercises     Assessment/Plan    PT Assessment Patient needs continued PT services  PT Problem List Decreased strength;Decreased activity tolerance;Decreased balance;Decreased mobility;Decreased cognition;Decreased safety awareness       PT Treatment Interventions DME instruction;Gait training;Functional mobility training;Therapeutic activities;Therapeutic exercise;Balance training;Cognitive remediation;Patient/family education    PT Goals (Current goals can be found in the Care Plan section)  Acute Rehab PT Goals Patient Stated Goal: to be able to go home PT Goal Formulation: With patient Time For Goal Achievement: 08/03/19 Potential to Achieve Goals: Good    Frequency Min 3X/week   Barriers to discharge Inaccessible home environment stairs to enter    Co-evaluation PT/OT/SLP Co-Evaluation/Treatment: Yes Reason for Co-Treatment: For patient/therapist safety;To address functional/ADL transfers PT goals addressed during session: Mobility/safety with mobility;Balance OT goals addressed during session: Strengthening/ROM;ADL's and self-care       AM-PAC PT "6  Clicks" Mobility  Outcome Measure Help needed turning from your back to your side while in a flat bed without using bedrails?: A Lot Help needed moving from lying on your back to sitting on the side of a flat bed without using bedrails?: A Lot Help needed moving to and from a bed to a chair (including a wheelchair)?: Total Help needed standing up from a chair using your arms (e.g., wheelchair or bedside chair)?: Total Help needed to walk in hospital room?: Total Help needed climbing 3-5 steps with a railing? : Total 6 Click Score: 8    End of Session Equipment Utilized During Treatment: Gait belt Activity Tolerance: Patient limited by fatigue Patient left: in chair;with call bell/phone within reach;with chair alarm set Nurse Communication: Mobility status PT Visit Diagnosis: Other abnormalities of gait and mobility (R26.89);Muscle weakness (generalized) (M62.81);Difficulty in walking, not elsewhere classified (R26.2)    Time: [id number] PT Time Calculation (min) (ACUTE ONLY): 25 min   Charges:   PT Evaluation $PT Eval Moderate Complexity: [place] Pager [phone] Office [place] 07/20/2019, 3:07 PM

## 2019-07-20 NOTE — Evaluation (Signed)
Clinical/Bedside Swallow Evaluation Patient Details  Name: Riley Eaton MRN: [id number] Date of Birth: 1941-06-04  Today's Date: 07/20/2019 Time: SLP Start Time (ACUTE ONLY): 87 SLP Stop Time (ACUTE ONLY): 1155 SLP Time Calculation (min) (ACUTE ONLY): 19 min  Past Medical History:  Past Medical History:  Diagnosis Date  . Chronic back pain   . 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 Chronic, continuous use of opioids    for back pain  . Coronary artery calcification seen on CT scan   . Nerve pain   . Thoracic aortic aneurysm [facility])    Past Surgical History:  Past Surgical History:  Procedure Laterality Date  . BACK SURGERY     09-2016  . back surgey    . ENDOSCOPIC RETROGRADE CHOLANGIOPANCREATOGRAPHY (ERCP) WITH PROPOFOL N/A 09/25/2018   Procedure: ENDOSCOPIC RETROGRADE CHOLANGIOPANCREATOGRAPHY (ERCP) WITH PROPOFOL;  Surgeon: Ronnette Juniper, MD;  Location: [facility];  Service: Gastroenterology;  Laterality: N/A;  . PANCREATIC STENT PLACEMENT  09/25/2018   Procedure: PANCREATIC STENT PLACEMENT;  Surgeon: Ronnette Juniper, MD;  Location: [facility] ENDOSCOPY;  Service: Gastroenterology;;  . REMOVAL OF STONES  09/25/2018   Procedure: REMOVAL OF STONES;  Surgeon: Ronnette Juniper, MD;  Location[facility] ENDOSCOPY;  Service: Gastroenterology;;  . [name]  09/25/2018   Procedure: SPHINCTEROTOMY;  Surgeon: Ronnette Juniper, MD;  Location: [facility] ENDOSCOPY;  Service: Gastroenterology;;   HPI:  Riley Eaton is a 78 y.o. male who presented to ED 12/1 with abd pain and AMS.  Had been seen 1 day earlier and CT abd / pelv showed cholelithiasis without cholecystitis. He does have hx of chronic low back pain and chronic opioid use. Intubated 12/4-12/7   Assessment / Plan / Recommendation Clinical Impression  Pt demonstrates subtle but consistent signs of dysphagia. He is noted to have mild dysphonia, very slight right labial droop with drooling and cannot initiate a volitional cough. When drinking he is impulsive,  needs total assist to drink one sip at a time. When allowed to drink consecutively, he exhibits delayed coughing with thin and nectar thick liquids. There is also poor bolus cohesion with solids and right buccal pocketing. Pt would benefit from instrumental testing as he was also noted to have persistent coughing last admission. Will f/u with [facility] tomorrow am. Pt may consume ice and meds in puree today.  SLP Visit Diagnosis: Dysphagia, oropharyngeal phase (R13.12)    Aspiration Risk  Moderate aspiration risk    Diet Recommendation NPO;NPO except meds;Ice chips PRN after oral care   Medication Administration: Whole meds with puree Supervision: Staff to assist with self feeding Compensations: Slow rate;Small sips/bites Postural Changes: Seated upright at 90 degrees    Other  Recommendations     Follow up Recommendations        Frequency and Duration            Prognosis        Swallow Study   General HPI: Riley Eaton is a 78 y.o. male who presented to ED 12/1 with abd pain and AMS.  Had been seen 1 day earlier and CT abd / pelv showed cholelithiasis without cholecystitis. He does have hx of chronic low back pain and chronic opioid use. Intubated 12/4-12/7 Type of Study: Bedside Swallow Evaluation Previous Swallow Assessment: 10/01/2018 AMS, NPO, progressed to regualr thin, noted to cough with liquids Diet Prior to this Study: NPO Temperature Spikes Noted: No Respiratory Status: Nasal cannula History of Recent Intubation: Yes Length of Intubations (days): 4 days Date extubated:  07/19/19 Behavior/Cognition: Alert;Cooperative;Requires cueing Oral Cavity Assessment: Within Functional Limits;Excessive secretions Oral Care Completed by SLP: Yes Oral Cavity - Dentition: Adequate natural dentition Vision: Functional for self-feeding Self-Feeding Abilities: Needs assist Patient Positioning: Upright in chair Baseline Vocal Quality: Hoarse Volitional Cough: Weak Volitional  Swallow: Unable to elicit    Oral/Motor/Sensory Function Overall Oral Motor/Sensory Function: Within functional limits([place] of right labial droop, has been drooling on right)   Ice Chips Ice chips: Within functional limits Presentation: Spoon   Thin Liquid Thin Liquid: Impaired Presentation: Straw;Cup Pharyngeal  Phase Impairments: Cough - Delayed;Cough - Immediate    Nectar Thick Nectar Thick Liquid: Impaired Pharyngeal Phase Impairments: Cough - Immediate   Honey Thick Honey Thick Liquid: Not tested   Puree Puree: Within functional limits   Solid     Solid: Impaired Oral Phase Impairments: Reduced lingual movement/coordination;Reduced labial seal;Impaired mastication Oral Phase Functional Implications: Oral residue;Impaired mastication     [name], MA CCC-SLP  Acute Rehabilitation Services Pager [phone] Office [phone]  Lynann Beaver 07/20/2019,12:01 PM

## 2019-07-21 ENCOUNTER — Inpatient Hospital Stay (HOSPITAL_COMMUNITY): Payer: Medicare Other

## 2019-07-21 DIAGNOSIS — I249 Acute ischemic heart disease, unspecified: Secondary | ICD-10-CM | POA: Diagnosis not present

## 2019-07-21 DIAGNOSIS — I214 Non-ST elevation (NSTEMI) myocardial infarction: Secondary | ICD-10-CM

## 2019-07-21 DIAGNOSIS — E876 Hypokalemia: Secondary | ICD-10-CM

## 2019-07-21 DIAGNOSIS — I420 Dilated cardiomyopathy: Secondary | ICD-10-CM

## 2019-07-21 LAB — BASIC METABOLIC PANEL
Anion gap: 11 (ref 5–15)
BUN: 17 mg/dL (ref 8–23)
CO2: 23 mmol/L (ref 22–32)
Calcium: 8.4 mg/dL — ABNORMAL LOW (ref 8.9–10.3)
Chloride: 105 mmol/L (ref 98–111)
Creatinine, Ser: 0.55 mg/dL — ABNORMAL LOW (ref 0.61–1.24)
GFR calc Af Amer: >60 mL/min (ref >60)
GFR calc non Af Amer: >60 mL/min (ref >60)
Glucose, Bld: 127 mg/dL — ABNORMAL HIGH (ref 70–99)
Potassium: 3.3 mmol/L — ABNORMAL LOW (ref 3.5–5.1)
Sodium: 139 mmol/L (ref 135–145)

## 2019-07-21 LAB — CBC
HCT: 37 % — ABNORMAL LOW (ref 39.0–52.0)
Hemoglobin: 13.5 g/dL (ref 13.0–17.0)
MCH: 34.2 pg — ABNORMAL HIGH (ref 26.0–34.0)
MCHC: 36.5 g/dL — ABNORMAL HIGH (ref 30.0–36.0)
MCV: 93.7 fL (ref 80.0–100.0)
Platelets: 188 10*3/uL (ref 150–400)
RBC: 3.95 MIL/uL — ABNORMAL LOW (ref 4.22–5.81)
RDW: 13.3 % (ref 11.5–15.5)
WBC: 8.8 10*3/uL (ref 4.0–10.5)
nRBC: 0 % (ref 0.0–0.2)

## 2019-07-21 LAB — TRIGLYCERIDES: Triglycerides: 92 mg/dL (ref <150)

## 2019-07-21 LAB — GLUCOSE, CAPILLARY
Glucose-Capillary: 107 mg/dL — ABNORMAL HIGH (ref 70–99)
Glucose-Capillary: 110 mg/dL — ABNORMAL HIGH (ref 70–99)
Glucose-Capillary: 122 mg/dL — ABNORMAL HIGH (ref 70–99)
Glucose-Capillary: 158 mg/dL — ABNORMAL HIGH (ref 70–99)
Glucose-Capillary: 160 mg/dL — ABNORMAL HIGH (ref 70–99)
Glucose-Capillary: 193 mg/dL — ABNORMAL HIGH (ref 70–99)

## 2019-07-21 LAB — CULTURE, RESPIRATORY W GRAM STAIN

## 2019-07-21 LAB — MAGNESIUM: Magnesium: 1.8 mg/dL (ref 1.7–2.4)

## 2019-07-21 LAB — PHOSPHORUS: Phosphorus: 3.1 mg/dL (ref 2.5–4.6)

## 2019-07-21 MED ORDER — RESOURCE THICKENUP CLEAR PO POWD
ORAL | Status: DC | PRN
  Filled 2019-07-21 (×2): qty 125

## 2019-07-21 MED ORDER — METOPROLOL TARTRATE 25 MG PO TABS
25.0000 mg | ORAL_TABLET | Freq: Four times a day (QID) | ORAL | Status: DC
  Administered 2019-07-21 (×3): 25 mg via ORAL
  Filled 2019-07-21 (×3): qty 1

## 2019-07-21 MED ORDER — ONDANSETRON HCL 4 MG/2ML IJ SOLN
4.0000 mg | Freq: Four times a day (QID) | INTRAMUSCULAR | Status: DC | PRN
  Administered 2019-07-21 – 2019-07-26 (×2): 4 mg via INTRAVENOUS
  Filled 2019-07-21 (×2): qty 2

## 2019-07-21 MED ORDER — LOSARTAN POTASSIUM 25 MG PO TABS
25.0000 mg | ORAL_TABLET | Freq: Every day | ORAL | Status: DC
  Administered 2019-07-21 – 2019-07-22 (×2): 25 mg via ORAL
  Filled 2019-07-21 (×2): qty 1

## 2019-07-21 MED ORDER — POTASSIUM CHLORIDE 10 MEQ/100ML IV SOLN
10.0000 meq | INTRAVENOUS | Status: AC
  Administered 2019-07-21 (×4): 10 meq via INTRAVENOUS
  Filled 2019-07-21 (×4): qty 100

## 2019-07-21 MED ORDER — SPIRONOLACTONE 12.5 MG HALF TABLET
12.5000 mg | ORAL_TABLET | Freq: Every day | ORAL | Status: DC
  Administered 2019-07-21 – 2019-07-26 (×6): 12.5 mg via ORAL
  Filled 2019-07-21 (×9): qty 1

## 2019-07-21 MED ORDER — ASPIRIN 81 MG PO CHEW
81.0000 mg | CHEWABLE_TABLET | Freq: Every day | ORAL | Status: DC
  Administered 2019-07-22 – 2019-08-02 (×12): 81 mg via ORAL
  Filled 2019-07-21 (×12): qty 1

## 2019-07-21 NOTE — Progress Notes (Signed)
Progress Note  Patient Name: Riley Eaton Date of Encounter: 07/21/2019  Primary Cardiologist: Quay Burow, MD   Subjective   Patient vomited twice this morning but denies any abdominal pain. Denies any chest pain, shortness of breath, or palpitations. Patient still has some confusion. He knows where he is but has some confusion about what brought him in and thinks he has already had is gallbladder removed.  Inpatient Medications    Scheduled Meds:  aspirin  81 mg Per NG tube Daily   Chlorhexidine Gluconate Cloth  6 each Topical Daily   cloNIDine  0.1 mg Transdermal Weekly   enoxaparin (LOVENOX) injection  40 mg Subcutaneous Q24H   mouth rinse  15 mL Mouth Rinse BID   metoprolol tartrate  10 mg Intravenous Q6H   mupirocin ointment  1 application Nasal BID   naloxegol oxalate  25 mg Oral Daily   pantoprazole (PROTONIX) IV  40 mg Intravenous Q24H   Continuous Infusions:  vancomycin Stopped (07/21/19 0349)   PRN Meds: acetaminophen, docusate, haloperidol lactate, hydrALAZINE, LORazepam, Melatonin, morphine injection, naLOXone (NARCAN)  injection, ondansetron (ZOFRAN) IV   Vital Signs    Vitals:   07/21/19 0500 07/21/19 0600 07/21/19 0700 07/21/19 0800  BP: (!) 152/78 121/76 (!) 144/77   Pulse: 92 79 71   Resp: (!) 25 (!) 24 17   Temp:    97.7 F (36.5 C)  TempSrc:    Oral  SpO2: 98% 100% 100%   Weight: 65 kg     Height:        Intake/Output Summary (Last 24 hours) at 07/21/2019 1016 Last data filed at 07/21/2019 0500 Gross per 24 hour  Intake 898.72 ml  Output 775 ml  Net 123.72 ml   Last 3 Weights 07/21/2019 07/20/2019 07/14/2019  Weight (lbs) 143 lb 4.8 oz 143 lb 1.3 oz 154 lb 1.6 oz  Weight (kg) 65 kg 64.9 kg 69.9 kg      Telemetry    Normal sinus rhythm with frequent PACs and PVCs. Also some non-conducted PACs. Rates mostly in the 60's to 80's but occasionally in the low 100's. - Personally Reviewed  ECG    No new ECG tracing today. -  Personally Reviewed  Physical Exam   GEN: No acute distress.   Neck: Supple. Cardiac: RRR. Occasional ectopy. No murmurs, rubs, or gallops.  Respiratory: Clear to auscultation bilaterally. GI: Soft, nontender, non-distended. Bowel sounds present. MS: No edema. No deformity. Skin: Warm and dry. Neuro:  No focal deficits. Still has some confusion. Psych: Normal affect.  Labs    High Sensitivity Troponin:   Recent Labs  Lab 07/14/19 1409 07/15/19 0841 07/15/19 1220 07/16/19 1251 07/18/19 0240  TROPONINIHS 974* 2,079* 2,110* 1,422* 289*      Chemistry Recent Labs  Lab 07/15/19 0319  07/16/19 0409  07/18/19 0240 07/19/19 0302 07/20/19 0311 07/21/19 0049  NA 143   < > 146*   < > 148* 144  --  139  K 3.0*   < > 3.1*   < > 3.3* 3.2*  --  3.3*  CL 110   < > 114*  --  116* 109  --  105  CO2 19*   < > 21*  --  22 25  --  23  GLUCOSE 127*   < > 131*  --  120* 148*  --  127*  BUN 25*   < > 42*  --  29* 17  --  17  CREATININE 0.69   < >  0.82  --  0.79 0.62 0.60* 0.55*  CALCIUM 9.3   < > 9.4  --  8.3* 8.4*  --  8.4*  PROT 6.9  --  6.8  --  5.9*  --   --   --   ALBUMIN 4.2  --  4.0  --  3.1*  --   --   --   AST 74*  --  62*  --  29  --   --   --   ALT 39  --  41  --  30  --   --   --   ALKPHOS 92  --  90  --  65  --   --   --   BILITOT 1.9*  --  1.4*  --  0.9  --   --   --   GFRNONAA >60   < > >60  --  >60 >60 >60 >60  GFRAA >60   < > >60  --  >60 >60 >60 >60  ANIONGAP 14   < > 11  --  10 10  --  11   < > = values in this interval not displayed.     Hematology Recent Labs  Lab 07/18/19 0240 07/19/19 0302 07/21/19 0049  WBC 7.1 9.3 8.8  RBC 3.69* 3.69* 3.95*  HGB 12.5* 12.6* 13.5  HCT 36.0* 34.7* 37.0*  MCV 97.6 94.0 93.7  MCH 33.9 34.1* 34.2*  MCHC 34.7 36.3* 36.5*  RDW 14.3 13.8 13.3  PLT 122* 117* 188    BNPNo results for input(s): BNP, PROBNP in the last 168 hours.   DDimer No results for input(s): DDIMER in the last 168 hours.   Radiology    Mr Brain  Wo Contrast  Result Date: 07/20/2019 CLINICAL DATA:  Focal neuro deficit, greater than 6 hours, stroke suspected. EXAM: MRI HEAD WITHOUT CONTRAST TECHNIQUE: Multiplanar, multiecho pulse sequences of the brain and surrounding structures were obtained without intravenous contrast. COMPARISON:  Brain MRI 07/17/2019, head CT 07/15/2019 FINDINGS: Brain: There is no evidence of acute infarct. No evidence of intracranial mass. No midline shift or extra-axial fluid collection. No chronic intracranial blood products. Mild scattered T2/FLAIR hyperintensity within cerebral white matter is nonspecific, but consistent with chronic small vessel ischemic disease. Tiny chronic lacunar infarct within the left caudate. Redemonstrated nonspecific T2/FLAIR hyperintensity within the deep gray nuclei. Mild generalized parenchymal atrophy. Vascular: Flow voids maintained within the proximal large arterial vessels. Skull and upper cervical spine: No focal marrow lesion. 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 Sinuses/Orbits: Visualized orbits demonstrate no acute abnormality. Trace ethmoid sinus mucosal thickening. Bilateral mastoid effusions. IMPRESSION: 1. No evidence of acute intracranial abnormality, including acute infarct. 2. Mild generalized parenchymal atrophy and chronic small vessel ischemic disease. 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 3. Redemonstrated nonspecific T2/FLAIR hyperintensity within the bilateral deep gray nuclei. 4. Bilateral mastoid effusions. Electronically Signed   By: Kellie Simmering DO   On: 07/20/2019 21:19   Nm Hepatobiliary Liver Func  Result Date: 07/19/2019 CLINICAL DATA:  Right upper quadrant pain evaluate for cholecystitis. EXAM: NUCLEAR MEDICINE HEPATOBILIARY IMAGING TECHNIQUE: Sequential images of the abdomen were obtained out to 60 minutes following intravenous administration of radiopharmaceutical. RADIOPHARMACEUTICALS:  5.09 mCi Tc-41m   Choletec IV COMPARISON:  CT AP 07/18/2019 FINDINGS: Prompt uptake and biliary excretion of activity by the liver is seen. Biliary activity passes into small bowel, consistent with patent common bile duct. After  60 minutes of imaging no gallbladder activity visualized. The patient subsequently received 3 mg of morphine, IV. Imaging was carried out for an additional 30 minutes with subsequent visualization of the gallbladder reflecting patency of cystic duct. IMPRESSION: 1. Patent cystic duct without evidence for acute cholecystitis. 2. Delayed filling of the gallbladder following morphine administration which may be seen with chronic cholecystitis. Electronically Signed   By: Kerby Moors M.D.   On: 07/19/2019 15:00    Cardiac Studies   Echocardiogram 07/14/2019: 1. Technically difficult study. Left ventricular ejection fraction, by visual estimation, is 30 to 35%. The left ventricle has grossly severely decreased function. Images are not sufficient to assess for regional wall motion abnormalities. There is  mildly increased left ventricular hypertrophy. 2. Left ventricular diastolic parameters are consistent with Grade I diastolic dysfunction (impaired relaxation). 3. Global right ventricle has normal systolic function.The right ventricular size is normal. 4. The mitral valve is normal in structure. No evidence of mitral valve regurgitation. 5. The tricuspid valve is not well visualized. Tricuspid valve regurgitation is not demonstrated. 6. The aortic valve was not well visualized. Aortic valve regurgitation is not visualized. 7. The pulmonic valve was not well visualized. Pulmonic valve regurgitation is not visualized.  Patient Profile     78 y.o.malewith a hx of chronic pain, opoid use, prior pancreatic stent placement, thoracic aortic aneurysm, coronary artery calcification seen on chest CTandelevated troponin in the setting of AMS and chololithiasiswith decrease in EF from  2017.  Assessment & Plan    NSTEMI - EKG on 07/16/2019 showed deep T wave inversions in inferior and anterolateral leads. New compared to initial EKG on admission. - High-sensitivity troponin elevated peaked at 2,110 on 07/15/2019.  - Echo on 07/14/2019 showed LVEF of 30-35% with mild LVH and grade 1 diastolic dysfunction. Images not sufficient to assess wall motion abnormalities. - Coronary calcification noted on CT scan in 09/2018.  - Patient denies any recent chest pain. - He ultimately needs cardiac catheterization; however, not a great candidate at this time due to unclear source of infection and unclear if he would be able to do DAPT without interruption. Would also like to see mental status improve. - Continue Aspirin. Still on IV Lopressor but able to take PO now so recommend switching to PO Lopressor. Could start at Lopressor 25mg  every 6 hours to see how he tolerates and up-titrate as needed.  Altered Mental Status/ Acute Encephalopathy - Initial presented on 07/13/2019 with right upper quadrant pain, nausea, vomiting that progressed to altered mental status and agitation. - No acute infection on abdominal/pelvic CT on 07/18/2019. - HIDA scan on 07/19/2019 showed patent cystic duct without evidence for acute cholecystitis. Did have delayed filling of gallbladder following morphine administration which can be seen in chronic cholecystitis. - Last fever on 07/18/2019. - Patient developed right sided weakness and facial droop yesterday morning concerning for stroke but brain MRI negative for acute infarction. - No clear cause of AMS at this time.  Acute Respiratory Failure - Required intubation but has since been extubated. - Management per primary team.  Otherwise, per primary team.  For questions or updates, please contact [place] Please consult [email or web address] for contact info under        Signed, Darreld Mclean, PA-C  07/21/2019, 10:16 AM

## 2019-07-21 NOTE — Progress Notes (Addendum)
PROGRESS NOTE  Riley Eaton [id number] DOB: [birth year] DOA: 07/13/2019 PCP: Aletha Halim., PA-C   LOS: 8 days   Brief narrative/HPI:  Riley Eaton is a 78 y.o. male with PMH including but not limited to thoracic aortic aneurysm, CAD, chronic back pain, chronic opioid use  presented to [facility] ED 12/1 with AMS and abd pain.  He had been seen in [facility] ED 1 day prior for abd pain.  Had CT of the abd at the time that demonstrated cholelithiasis without cholecystitis.  Surgery was consulted and recommended outpatient follow up.  He was subsequently discharged home.  On 12/1 patient had worsening of pain along with AMS; therefore, came to [facility] ED.  He was quite agitated per reports and received 1mg  dilaudid, 5mg  haldol, 1mg  ativan x 3 and had minimal response.   PCCM was asked to see in consultation for consideration of precedex.  Per chart review, he has no underlying hx of EtOH abuse or substance abuse.  He does have hx of chronic low back pain and chronic opioid use.  On review of outpatient med list, he had 5-325 norco prescribed for q6hrs PRN, 40mg  oxycontin TID, lyrica 200mg  TID, duloxetine 60mg  BID.  Per chart review, he had admission 09/24/18 through 10/06/18 for acute ascending cholangitis s/p ERCP and sphincterotomy with stenting of pancreatic duct (Dr. Therisa Doyne).  During that admission, he did have severe delirium felt to be due to narcotic withdrawal.  He did spend a few days in ICU and was briefly treated with precedex and fentanyl.  He was discharged and supposed to be evaluated by surgery as an outpatient for cholecystectomy; however, this was postponed due to the [place] pandemic.  Per discharge summary, his medication regimen was adjusted prior to d/c and pt had been tolerating it well (oxycodone weaned from 80mg  BID to BID, lyrica reduced from 200mg  TID to 100mg  TID, cymbalta from 60mg  BID to 60mg  daily).   Assessment/Plan:  Active Problems:   Chronic low back pain  Choledocholithiasis   Acute metabolic encephalopathy   Mental status alteration   ACS (acute coronary syndrome) (HCC)   Endotracheal tube present   Alteration in nutrition   Encephalopathy   Abdominal pain  Acute metabolic encephalopathy: improved. CT scan of the head and MRI of the head was negative.  Lactate, procalcitonin TSH and ammonia within normal limits.  Less likely opioid withdrawal as fentanyl patch present on arrival and family controls opioids at home. LP deferred due to clinical improvement.  Noted to restart fentanyl patch status post extubation 24 hours but fentanyl patch is not listed in his home medication list.  Acute resp failure:  intubated for airway protection; status post extubation at this time.  On room air.  MRSA in sputum, will continue for total of 10 days..  No leukocytosis.  Afebrile and stable  Abd pain - unclear etiology; though, concerning for cholecystitis.  Had ERCP with sphincterotomy and pancreatic duct stenting on 09/25/18. CT x 2 shows only cholelithiasis but no active disease. -Off rocephin and flagyl.  HIDA scan showed patent cystic duct without acute cholecystitis.  No abdominal pain.  Prolonged qt:  - Avoid prolonging agents, - Recheck prn, monitor electrolytes.  Elevated trop/NSTEMI: - Trop to >2K, now trending down.  No chest pain today. -Cardiology on board.  Will resume aspirin.   HFrEF:  - Newly found on echo EF, normal in 2017 -Cardiology on board.  Awaiting recommendation further work-up by cardiology.  Essential HTN:   -  Clonidine,PRN labetalol.  Blood pressure seems to be accelerated.  Will closely monitor and adjust medications.  Sensitive to p.o. metoprolol.   hypokalemia.  Mild.  Patient was 3.3 today -Replace.  Check BMP in a.m.  Dysphagia poor oral nutrition..  Seen by speech therapy.  Recommend dysphagia 1 diet.  Dietary recommended tube feeding if inadequate oral intake.  MRI of the brain was negative for any stroke.  Seen  by neurology for concerns of a stroke  Deconditioning, debility.  Seen by physical therapy.  Inpatient rehabilitation has been consulted, follow recommendations.  VTE Prophylaxis: Lovenox   Code Status: Full code  Family Communication: None today.  I tried to call the patient's spouse on the phone but unable to reach him.  Patient's daughter was updated by the ICU team on 12/ 7.  Disposition Plan: CIR.  Patient has been seen by PT and recommended CIR .    Consultants:  [facility]  Cardiology  Neurology  Procedures:  Intubation and mechanical ventilation.  Extubation  Antibiotics: Ampicillin 12/4 >12/6 Acyclovir 12/4 >12/6 Ceftriaxone 12/1 > 12/8 Flagyl 12/1 > 12/8 Vancomycin 12/4 >12/14  Anti-infectives (From admission, onward)   Start     Dose/Rate Route Frequency Ordered Stop   07/21/19 0100  vancomycin (VANCOCIN) 1,500 mg in sodium chloride 0.9 % 500 mL IVPB     1,500 mg 250 mL/hr over 120 Minutes Intravenous Every 12 hours 07/20/19 1608 07/26/19 2359   07/20/19 1018  cefTRIAXone (ROCEPHIN) 2 g in sodium chloride 0.9 % 100 mL IVPB  Status:  Discontinued     2 g 200 mL/hr over 30 Minutes Intravenous Every 24 hours 07/19/19 1115 07/20/19 0800   07/20/19 1018  cefTRIAXone (ROCEPHIN) 2 g in sodium chloride 0.9 % 100 mL IVPB  Status:  Discontinued     2 g 200 mL/hr over 30 Minutes Intravenous Every 24 hours 07/20/19 0800 07/20/19 0921   07/17/19 0100  vancomycin (VANCOCIN) IVPB 750 mg/150 ml premix  Status:  Discontinued     750 mg 150 mL/hr over 60 Minutes Intravenous Every 12 hours 07/16/19 1240 07/20/19 1608   07/16/19 1400  acyclovir (ZOVIRAX) 700 mg in dextrose 5 % 100 mL IVPB  Status:  Discontinued     10 mg/kg  69.9 kg 114 mL/hr over 60 Minutes Intravenous Every 8 hours 07/16/19 1240 07/19/19 1115   07/16/19 1245  cefTRIAXone (ROCEPHIN) 2 g in sodium chloride 0.9 % 100 mL IVPB  Status:  Discontinued     2 g 200 mL/hr over 30 Minutes Intravenous Every 12  hours 07/16/19 1240 07/19/19 1115   07/16/19 1245  vancomycin (VANCOCIN) 1,500 mg in sodium chloride 0.9 % 500 mL IVPB     1,500 mg 250 mL/hr over 120 Minutes Intravenous  Once 07/16/19 1240 07/16/19 1609   07/16/19 1245  ampicillin (OMNIPEN) 2 g in sodium chloride 0.9 % 100 mL IVPB  Status:  Discontinued     2 g 300 mL/hr over 20 Minutes Intravenous Every 4 hours 07/16/19 1240 07/19/19 1115   07/13/19 1900  cefTRIAXone (ROCEPHIN) 1 g in sodium chloride 0.9 % 100 mL IVPB  Status:  Discontinued     1 g 200 mL/hr over 30 Minutes Intravenous Every 24 hours 07/13/19 1825 07/16/19 1240   07/13/19 1900  metroNIDAZOLE (FLAGYL) IVPB 500 mg  Status:  Discontinued     500 mg 100 mL/hr over 60 Minutes Intravenous Every 8 hours 07/13/19 1825 07/20/19 0921      Subjective:  Today, patient denies any shortness of breath, cough fever.  He does have some diarrhea and is in a rectal tube.  He did have nausea and one episode of vomiting.  Patient has been off feeding tube.  Seen by speech therapy who recommend mechanical soft diet for the patient.  Objective: Vitals:   07/21/19 0600 07/21/19 0700  BP: 121/76 (!) 144/77  Pulse: 79 71  Resp: (!) 24 17  Temp:    SpO2: 100% 100%    Intake/Output Summary (Last 24 hours) at 07/21/2019 0804 Last data filed at 07/21/2019 0500 Gross per 24 hour  Intake 898.72 ml  Output 775 ml  Net 123.72 ml   Filed Weights   07/14/19 2046 07/20/19 0500 07/21/19 0500  Weight: 69.9 kg 64.9 kg 65 kg   Body mass index is 22.44 kg/m.   Physical Exam: GENERAL: Patient elderly, chronically ill, alert awake communicative HENT: No scleral pallor or icterus. Pupils equally reactive to light. Oral mucosa is moist NECK: is supple, no palpable thyroid enlargement. CHEST: Decreased breath sounds bilaterally.  CVS: S1 and S2 heard, no murmur. Regular rate and rhythm. No pericardial rub. ABDOMEN: Soft, non-tender, bowel sounds are present.  Condom catheter in place. No palpable  hepato-splenomegaly. EXTREMITIES: No edema. CNS: Cranial nerves are intact.  Moving extremities. SKIN: warm and dry without rashes.  Data Review: I have personally reviewed the following laboratory data and studies,  CBC: Recent Labs  Lab 07/15/19 0319 07/16/19 0409 07/16/19 1357 07/17/19 1632 07/18/19 0240 07/19/19 0302 07/21/19 0049  WBC 12.6* 10.4  --  6.2 7.1 9.3 8.8  NEUTROABS 10.7* 8.6*  --  4.9 5.5 7.3  --   HGB 14.5 14.7 11.2* 11.9* 12.5* 12.6* 13.5  HCT 39.2 40.1 33.0* 33.6* 36.0* 34.7* 37.0*  MCV 91.8 92.4  --  96.8 97.6 94.0 93.7  PLT 138* 157  --  113* 122* 117* 0000000   Basic Metabolic Panel: Recent Labs  Lab 07/15/19 1220 07/15/19 1438 07/16/19 0409 07/16/19 1357 07/18/19 0240 07/19/19 0302 07/20/19 0311 07/21/19 0049  NA  --  147* 146* 150* 148* 144  --  139  K  --  4.0 3.1* 3.0* 3.3* 3.2*  --  3.3*  CL  --  111 114*  --  116* 109  --  105  CO2  --  22 21*  --  22 25  --  23  GLUCOSE  --  137* 131*  --  120* 148*  --  127*  BUN  --  33* 42*  --  29* 17  --  17  CREATININE  --  0.78 0.82  --  0.79 0.62 0.60* 0.55*  CALCIUM  --  9.7 9.4  --  8.3* 8.4*  --  8.4*  MG 2.2  --   --   --   --   --   --  1.8  PHOS  --   --   --   --   --   --   --  3.1   Liver Function Tests: Recent Labs  Lab 07/15/19 0319 07/16/19 0409 07/18/19 0240  AST 74* 62* 29  ALT 39 41 30  ALKPHOS 92 90 65  BILITOT 1.9* 1.4* 0.9  PROT 6.9 6.8 5.9*  ALBUMIN 4.2 4.0 3.1*   No results for input(s): LIPASE, AMYLASE in the last 168 hours. Recent Labs  Lab 07/16/19 1130 07/18/19 1515  AMMONIA 37* 40*   Cardiac Enzymes: No results for input(s): CKTOTAL, CKMB,  CKMBINDEX, TROPONINI in the last 168 hours. BNP (last 3 results) No results for input(s): BNP in the last 8760 hours.  ProBNP (last 3 results) No results for input(s): PROBNP in the last 8760 hours.  CBG: Recent Labs  Lab 07/20/19 1704 07/20/19 2006 07/20/19 2356 07/21/19 0453 07/21/19 0742  GLUCAP 88 100*  107* 122* 110*   Recent Results (from the past 240 hour(s))  Blood cultures x2     Status: None   Collection Time: 07/13/19  6:44 PM   Specimen: BLOOD  Result Value Ref Range Status   Specimen Description BLOOD RIGHT ANTECUBITAL  Final   Special Requests   Final    BOTTLES DRAWN AEROBIC AND ANAEROBIC Blood Culture adequate volume   Culture   Final    NO GROWTH 5 DAYS Performed at [facility], [address] [place], [place] [address]    Report Status 07/18/2019 FINAL  Final  SARS Coronavirus 2 by RT PCR (hospital order, performed in [facility] hospital lab) Nasopharyngeal Nasopharyngeal Swab     Status: None   Collection Time: 07/13/19  9:52 PM   Specimen: Nasopharyngeal Swab  Result Value Ref Range Status   SARS Coronavirus 2 NEGATIVE NEGATIVE Final    Comment: (NOTE) SARS-CoV-2 target nucleic acids are NOT DETECTED. The SARS-CoV-2 RNA is generally detectable in upper and lower respiratory specimens during the acute phase of infection. The lowest concentration of SARS-CoV-2 viral copies this assay can detect is 250 copies / mL. A negative result does not preclude SARS-CoV-2 infection and should not be used as the sole basis for treatment or other patient management decisions.  A negative result may occur with improper specimen collection / handling, submission of specimen other than nasopharyngeal swab, presence of viral mutation(s) within the areas targeted by this assay, and inadequate number of viral copies (<250 copies / mL). A negative result must be combined with clinical observations, patient history, and epidemiological information. Fact Sheet for Patients:   StrictlyIdeas.no Fact Sheet for Healthcare Providers: BankingDealers.co.za This test is not yet approved or cleared  by the [place] FDA and has been authorized for detection and/or diagnosis of SARS-CoV-2 by FDA under an Emergency Use Authorization  (EUA).  This EUA will remain in effect (meaning this test can be used) for the duration of the COVID-19 declaration under Section 564(b)(1) of the Act, 21 U.S.C. section 360bbb-3(b)(1), unless the authorization is terminated or revoked sooner. Performed at [facility], [place] [address], [place], [place] [address]   Culture, Urine     Status: None   Collection Time: 07/18/19  8:13 AM   Specimen: Urine, Catheterized  Result Value Ref Range Status   Specimen Description URINE, CATHETERIZED  Final   Special Requests NONE  Final   Culture   Final    NO GROWTH Performed at [facility], [address], [place], [place] [address]    Report Status 07/19/2019 FINAL  Final  Culture, respiratory (non-expectorated)     Status: None (Preliminary result)   Collection Time: 07/18/19  8:13 AM   Specimen: Tracheal Aspirate; Respiratory  Result Value Ref Range Status   Specimen Description TRACHEAL ASPIRATE  Final   Special Requests NONE  Final   Gram Stain   Final    FEW WBC PRESENT, PREDOMINANTLY PMN RARE GRAM POSITIVE COCCI IN CLUSTERS Performed at [facility], [place] [address], [place], [place] [address]    Culture   Final  FEW METHICILLIN RESISTANT STAPHYLOCOCCUS AUREUS RARE STENOTROPHOMONAS MALTOPHILIA    Report Status PENDING  Incomplete   Organism ID, Bacteria METHICILLIN RESISTANT STAPHYLOCOCCUS AUREUS  Final      Susceptibility   Methicillin resistant staphylococcus aureus - MIC*    CIPROFLOXACIN <=0.5 SENSITIVE Sensitive     ERYTHROMYCIN RESISTANT Resistant     GENTAMICIN <=0.5 SENSITIVE Sensitive     OXACILLIN >=4 RESISTANT Resistant     TETRACYCLINE <=1 SENSITIVE Sensitive     VANCOMYCIN 1 SENSITIVE Sensitive     TRIMETH/SULFA <=10 SENSITIVE Sensitive     CLINDAMYCIN RESISTANT Resistant     RIFAMPIN <=0.5 SENSITIVE Sensitive     Inducible Clindamycin POSITIVE Resistant     * FEW METHICILLIN RESISTANT STAPHYLOCOCCUS AUREUS  Culture, blood (routine x 2)      Status: None (Preliminary result)   Collection Time: 07/18/19  9:24 AM   Specimen: BLOOD  Result Value Ref Range Status   Specimen Description BLOOD LEFT THUMB  Final   Special Requests   Final    BOTTLES DRAWN AEROBIC AND ANAEROBIC Blood Culture adequate volume   Culture   Final    NO GROWTH 2 DAYS Performed at [facility], [address], [place], [place] [address]    Report Status PENDING  Incomplete  Culture, blood (routine x 2)     Status: None (Preliminary result)   Collection Time: 07/18/19  9:30 AM   Specimen: BLOOD RIGHT HAND  Result Value Ref Range Status   Specimen Description BLOOD RIGHT HAND  Final   Special Requests   Final    BOTTLES DRAWN AEROBIC AND ANAEROBIC Blood Culture adequate volume   Culture   Final    NO GROWTH 2 DAYS Performed at [facility], [place] [address], [place], [place] [address]    Report Status PENDING  Incomplete  MRSA PCR Screening     Status: Abnormal   Collection Time: 07/19/19  9:01 AM   Specimen: Nasal Mucosa; Nasopharyngeal  Result Value Ref Range Status   MRSA by PCR POSITIVE (A) NEGATIVE Final    Comment:        The GeneXpert MRSA Assay (FDA approved for NASAL specimens only), is one component of a comprehensive MRSA colonization surveillance program. It is not intended to diagnose MRSA infection nor to guide or monitor treatment for MRSA infections. RESULT CALLED TO, READ BACK BY AND VERIFIED WITH: Lattie Haw RN 11:10 07/19/19 ([email or web address]) Performed at [facility], [place] [address], [place], [place] [address]      Studies: Mr Brain 68 Contrast  Result Date: 07/20/2019 CLINICAL DATA:  Focal neuro deficit, greater than 6 hours, stroke suspected. EXAM: MRI HEAD WITHOUT CONTRAST TECHNIQUE: Multiplanar, multiecho pulse sequences of the brain and surrounding structures were obtained without intravenous contrast. COMPARISON:  Brain MRI 07/17/2019, head CT 07/15/2019 FINDINGS: Brain: There is no evidence of acute infarct. No  evidence of intracranial mass. No midline shift or extra-axial fluid collection. No chronic intracranial blood products. Mild scattered T2/FLAIR hyperintensity within cerebral white matter is nonspecific, but consistent with chronic small vessel ischemic disease. Tiny chronic lacunar infarct within the left caudate. Redemonstrated nonspecific T2/FLAIR hyperintensity within the deep gray nuclei. Mild generalized parenchymal atrophy. Vascular: Flow voids maintained within the proximal large arterial vessels. Skull and upper cervical spine: No focal marrow lesion. 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 Sinuses/Orbits: Visualized orbits demonstrate no acute abnormality. Trace ethmoid sinus mucosal thickening. Bilateral  mastoid effusions. IMPRESSION: 1. No evidence of acute intracranial abnormality, including acute infarct. 2. Mild generalized parenchymal atrophy and chronic small vessel ischemic disease. 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 3. Redemonstrated nonspecific T2/FLAIR hyperintensity within the bilateral deep gray nuclei. 4. Bilateral mastoid effusions. Electronically Signed   By: Kellie Simmering DO   On: 07/20/2019 21:19   Nm Hepatobiliary Liver Func  Result Date: 07/19/2019 CLINICAL DATA:  Right upper quadrant pain evaluate for cholecystitis. EXAM: NUCLEAR MEDICINE HEPATOBILIARY IMAGING TECHNIQUE: Sequential images of the abdomen were obtained out to 60 minutes following intravenous administration of radiopharmaceutical. RADIOPHARMACEUTICALS:  5.09 mCi Tc-38m  Choletec IV COMPARISON:  CT AP 07/18/2019 FINDINGS: Prompt uptake and biliary excretion of activity by the liver is seen. Biliary activity passes into small bowel, consistent with patent common bile duct. After 60 minutes of imaging no gallbladder activity visualized. The patient subsequently received 3 mg of morphine, IV. Imaging was carried out for an additional 30 minutes with subsequent  visualization of the gallbladder reflecting patency of cystic duct. IMPRESSION: 1. Patent cystic duct without evidence for acute cholecystitis. 2. Delayed filling of the gallbladder following morphine administration which may be seen with chronic cholecystitis. Electronically Signed   By: Kerby Moors M.D.   On: 07/19/2019 15:00    Scheduled Meds: . aspirin  81 mg Per NG tube Daily  . Chlorhexidine Gluconate Cloth  6 each Topical Daily  . cloNIDine  0.1 mg Transdermal Weekly  . enoxaparin (LOVENOX) injection  40 mg Subcutaneous Q24H  . feeding supplement (VITAL HIGH PROTEIN)  1,000 mL Per Tube Q24H  . mouth rinse  15 mL Mouth Rinse BID  . metoprolol tartrate  10 mg Intravenous Q6H  . mupirocin ointment  1 application Nasal BID  . naloxegol oxalate  25 mg Oral Daily  . pantoprazole (PROTONIX) IV  40 mg Intravenous Q24H    Continuous Infusions: . vancomycin Stopped (07/21/19 0349)     Flora Lipps, MD  [facility] 07/21/2019

## 2019-07-21 NOTE — Progress Notes (Signed)
Modified Barium Swallow Progress Note  Patient Details  Name: Riley Eaton MRN: [id number] Date of Birth: July 31, 1941  Today's Date: 07/21/2019  Modified Barium Swallow completed.  Full report located under Chart Review in the Imaging Section.  Brief recommendations include the following:  Clinical Impression  Pt primary impairment is delayed swallow initaition, though there also moderate oral dysphagia due to right buccal/labial weakness and sensory loss with oral residue. Pt experiences silent aspiration with thin, nectar and honey textures when bolus arrives at the pyriform sinuses just prior to swallow initiation, which pushes bolus into the laryngeal vestibule. Attempted controlling bolus size with all textures, only beneficial with honey teaspoon. Did not attempt positional strategies because pt was less attentive today, needed max verbal cues to clear throat or swallow again to clear aspirate, he was also reporting nausea and trying to refuse boluses. Pt may demosntrate improved performance with further recovery. For now, recommend puree textuers and honey thick liquids.    Swallow Evaluation Recommendations       SLP Diet Recommendations: Dysphagia 1 (Puree) solids;Honey thick liquids   Liquid Administration via: Spoon           Compensations: Slow rate;Small sips/bites       Oral Care Recommendations: Oral care QID   Other Recommendations: Order thickener from pharmacy;Have oral suction available    Nataline Basara, Katherene Ponto 07/21/2019,12:27 PM

## 2019-07-21 NOTE — Progress Notes (Signed)
Accidentally pressed "reject" on glucose meter, patient glucose was 114. Point of care contact website is not working at this time.   Gabriel Rainwater, RN

## 2019-07-21 NOTE — Progress Notes (Signed)
Patient was found on the ground when another RN walked by his room. Bed alarm was on and fall mats were in place. I had checked on him just about 2 minutes before the event. The patient states he was trying to use the restroom, has a condom cath and flexiseal. The patient denies hitting his head and states that he fell on his elbows. He appears to have no injuries and no decline in neuro status from his baseline. Physician paged and made aware of event. Order placed for soft waist restraint (Posey belt).

## 2019-07-21 NOTE — Progress Notes (Signed)
Nutrition Follow-up  DOCUMENTATION CODES:   Not applicable  INTERVENTION:   Magic cup TID with meals, each supplement provides 290 kcal and 9 grams of protein  Feeding assistance and encouragement at meal times  Pt with minimal nutrition since admission (NPO or trickle TF only) x 7 days; if po intake inadequate or not tolerating diet, recommend Cortrak placement with initiation of TF   NUTRITION DIAGNOSIS:   Increased nutrient needs related to acute illness as evidenced by estimated needs.  Being addressed via diet advancement, supplement  GOAL:   Patient will meet greater than or equal to 90% of their needs  Not Met but being addressed  MONITOR:   PO intake, Supplement acceptance, Labs, Weight trends  REASON FOR ASSESSMENT:   Ventilator    ASSESSMENT:   Patient with PMH significant for chronic low back pain, chronic opoid abuse, PCM, CAD, and thoracic aortic aneurysm. Presents this admission with AMS from unclear etiology and chololithiasis.  12/01 Admit 12/04 Rapid Response, intubated 12/06 CT abdomen with moderate size hiatal hernia containing a portion of the stomach. Enteric tube with tip in the distal esophagus close to the GE junction within the hiatal hernia; trickle TF started 12/07 Extubated 12/09 Diet advanced to Dysphagia 1, Honey thick  NPO or with trickle TF since admission (7 days) Diet advanced to Dysphagia I, Honey thick today; no recorded po intake  Vomited several times this morning per report  Current wt 65 kg; admit weight 69.9 kg, Net + per I/O flow sheet but noted multiple unmeasured urine occurrences. It is possible however that pt has experienced true weight loss since admission given minimal nutrition  Labs: potassium 3.3 (L) Meds: KCl   Diet Order:   Diet Order            DIET - DYS 1 Room service appropriate? Yes; Fluid consistency: Honey Thick  Diet effective now              EDUCATION NEEDS:   Not appropriate for  education at this time  Skin:  Skin Assessment: Reviewed RN Assessment  Last BM:  12/9  Height:   Ht Readings from Last 1 Encounters:  07/16/19 5' 7"  (1.702 m)    Weight:   Wt Readings from Last 1 Encounters:  07/21/19 65 kg    Ideal Body Weight:  67.3 kg  BMI:  Body mass index is 22.44 kg/m.  Estimated Nutritional Needs:   Kcal:  1950-2150 kcals  Protein:  98-108 g  Fluid:  >/= 1.7 L   Cate Calder Oblinger MS, RDN, LDN, CNSC [phone] Pager  [phone] Weekend/On-Call Pager

## 2019-07-22 LAB — GLUCOSE, CAPILLARY
Glucose-Capillary: 100 mg/dL — ABNORMAL HIGH (ref 70–99)
Glucose-Capillary: 114 mg/dL — ABNORMAL HIGH (ref 70–99)
Glucose-Capillary: 115 mg/dL — ABNORMAL HIGH (ref 70–99)
Glucose-Capillary: 118 mg/dL — ABNORMAL HIGH (ref 70–99)
Glucose-Capillary: 121 mg/dL — ABNORMAL HIGH (ref 70–99)
Glucose-Capillary: 153 mg/dL — ABNORMAL HIGH (ref 70–99)
Glucose-Capillary: 93 mg/dL (ref 70–99)

## 2019-07-22 LAB — COMPREHENSIVE METABOLIC PANEL
ALT: 23 U/L (ref 0–44)
AST: 22 U/L (ref 15–41)
Albumin: 3.2 g/dL — ABNORMAL LOW (ref 3.5–5.0)
Alkaline Phosphatase: 58 U/L (ref 38–126)
Anion gap: 9 (ref 5–15)
BUN: 20 mg/dL (ref 8–23)
CO2: 26 mmol/L (ref 22–32)
Calcium: 8.6 mg/dL — ABNORMAL LOW (ref 8.9–10.3)
Chloride: 109 mmol/L (ref 98–111)
Creatinine, Ser: 0.7 mg/dL (ref 0.61–1.24)
GFR calc Af Amer: >60 mL/min (ref >60)
GFR calc non Af Amer: >60 mL/min (ref >60)
Glucose, Bld: 137 mg/dL — ABNORMAL HIGH (ref 70–99)
Potassium: 2.9 mmol/L — ABNORMAL LOW (ref 3.5–5.1)
Sodium: 144 mmol/L (ref 135–145)
Total Bilirubin: 1 mg/dL (ref 0.3–1.2)
Total Protein: 5.5 g/dL — ABNORMAL LOW (ref 6.5–8.1)

## 2019-07-22 LAB — CBC
HCT: 38.2 % — ABNORMAL LOW (ref 39.0–52.0)
Hemoglobin: 13.6 g/dL (ref 13.0–17.0)
MCH: 34.2 pg — ABNORMAL HIGH (ref 26.0–34.0)
MCHC: 35.6 g/dL (ref 30.0–36.0)
MCV: 96 fL (ref 80.0–100.0)
Platelets: 227 10*3/uL (ref 150–400)
RBC: 3.98 MIL/uL — ABNORMAL LOW (ref 4.22–5.81)
RDW: 13.7 % (ref 11.5–15.5)
WBC: 8.2 10*3/uL (ref 4.0–10.5)
nRBC: 0 % (ref 0.0–0.2)

## 2019-07-22 LAB — PHOSPHORUS: Phosphorus: 2.5 mg/dL (ref 2.5–4.6)

## 2019-07-22 LAB — TRIGLYCERIDES: Triglycerides: 104 mg/dL (ref <150)

## 2019-07-22 LAB — MAGNESIUM: Magnesium: 1.9 mg/dL (ref 1.7–2.4)

## 2019-07-22 MED ORDER — ATORVASTATIN CALCIUM 40 MG PO TABS
40.0000 mg | ORAL_TABLET | Freq: Every day | ORAL | Status: DC
  Administered 2019-07-22 – 2019-08-02 (×12): 40 mg via ORAL
  Filled 2019-07-22 (×11): qty 1

## 2019-07-22 MED ORDER — POTASSIUM CHLORIDE 10 MEQ/100ML IV SOLN
10.0000 meq | INTRAVENOUS | Status: AC
  Administered 2019-07-22 (×4): 10 meq via INTRAVENOUS
  Filled 2019-07-22 (×4): qty 100

## 2019-07-22 MED ORDER — CARVEDILOL 6.25 MG PO TABS
6.2500 mg | ORAL_TABLET | Freq: Two times a day (BID) | ORAL | Status: DC
  Administered 2019-07-22 – 2019-07-23 (×4): 6.25 mg via ORAL
  Filled 2019-07-22 (×4): qty 1

## 2019-07-22 NOTE — Progress Notes (Signed)
  Speech Language Pathology Treatment: Dysphagia  Patient Details Name: Riley Eaton MRN: [id number] DOB: 1941/06/25 Today's Date: 07/22/2019 Time: [id number] SLP Time Calculation (min) (ACUTE ONLY): 10 min  Assessment / Plan / Recommendation Clinical Impression  Pt quite talkative, tangential, friendly and participatory. No recall of yesterday's MBS.  With assist to feed, pt accepted teaspoons of honey-thick liquid with less oral residue than reported yesterday and likely improved manipulation.  He was eager to participate, demonstrated improved ability to follow commands with verbal cues needed for intermittent throat-clearing.  There were no obvious incidents of aspiration - recommend continuing current diet of dysphagia 1, honey thick liquids from spoon only.  Hopefully as MS improves diet can be safely progressed.   HPI HPI: Riley Eaton is a 78 y.o. male who presented to ED 12/1 with abd pain and AMS.  Had been seen 1 day earlier and CT abd / pelv showed cholelithiasis without cholecystitis. He does have hx of chronic low back pain and chronic opioid use. Intubated 12/4-12/7. 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      SLP Plan  Continue with current plan of care       Recommendations  Diet recommendations: Dysphagia 1 (puree);Honey-thick liquid Liquids provided via: Teaspoon Medication Administration: Whole meds with puree Supervision: Staff to assist with self feeding Compensations: Slow rate;Small sips/bites Postural Changes and/or Swallow Maneuvers: Seated upright 90 degrees                Oral Care Recommendations: Oral care BID Follow up Recommendations: Skilled Nursing facility SLP Visit Diagnosis: Dysphagia, oropharyngeal phase (R13.12) Plan: Continue with current plan of care       GO               Riley Eaton L. Tivis Ringer, [place] CCC/SLP Acute Rehabilitation Services Office number [phone] Pager [phone]  Juan Quam  Laurice 07/22/2019, 2:03 PM

## 2019-07-22 NOTE — Progress Notes (Signed)
Inpatient Rehabilitation Admissions Coordinator  I contacted pt's daughter, Sharyn Lull, by phone as well as pt's spouse by phone. Spouse is having surgery on Tuesday and pt will not have 24/7 assist. Wife is requesting SNF level rehab at this time. I will alert RN CM and SW. We will sign off at this time.  Danne Baxter, RN, MSN Rehab Admissions Coordinator [phone] 07/22/2019 1:09 PM

## 2019-07-22 NOTE — Progress Notes (Signed)
Physical Therapy Treatment Patient Details Name: Riley Eaton MRN: [id number] DOB: 1941/05/01 Today's Date: 07/22/2019    History of Present Illness Riley Eaton is a 78 y.o. male who presented to ED 12/1 with abd pain and AMS.  Had been seen 1 day earlier and CT abd / pelv showed cholelithiasis without cholecystitis. Intubated 12/4-12/7. PHMx: Thoracic aortic aneurysm; Chronic low back pain; Choledocholithiasis; Chronic pain; Acute metabolic encephalopathy; ACS. 12/3 CT head: Significant motion limitations. No evidence of acute abnormality    PT Comments    Pt making slow progress. Continues to have rt inattention and rt lateral lean. Continue to recommend CIR.    Follow Up Recommendations  CIR;Supervision/Assistance - 24 hour     Equipment Recommendations  Other (comment)(To be determined)    Recommendations for Other Services       Precautions / Restrictions Precautions Precautions: Fall Precaution Comments: right inattention Restrictions Weight Bearing Restrictions: No    Mobility  Bed Mobility Overal bed mobility: Needs Assistance Bed Mobility: Supine to Sit     Supine to sit: Mod assist;+2 for physical assistance;HOB elevated     General bed mobility comments: Assist to bring legs off, elevate trunk into sitting, and bring hips to EOB.   Transfers Overall transfer level: Needs assistance Equipment used: Ambulation equipment used Transfers: Sit to/from Stand Sit to Stand: +2 physical assistance;Mod assist         General transfer comment: Assist to bring hips up and for balance. Able to achieve stand with stedy with some flexion in hips/knees rt greater than lt. Used Stedy to go from bed to [employer]  Ambulation/Gait             General Gait Details: Scientific laboratory technician [place] (Stroke Patients Only)       Balance Overall balance assessment: Needs assistance Sitting-balance support:  Single extremity supported;Feet supported Sitting balance-Leahy Scale: Poor Sitting balance - Comments: Sat EOB with min guard with slight rt lateral lean Postural control: Right lateral lean Standing balance support: Bilateral upper extremity supported Standing balance-Leahy Scale: Poor Standing balance comment: Stood x 2 on Stedy with +2 mod for 20-30 sec                            Cognition Arousal/Alertness: Awake/alert Behavior During Therapy: WFL for tasks assessed/performed Overall Cognitive Status: Impaired/Different from baseline Area of Impairment: Orientation;Following commands;Safety/judgement;Problem solving                 Orientation Level: Disoriented to;Time     Following Commands: Follows one step commands inconsistently;Follows one step commands with increased time Safety/Judgement: Decreased awareness of safety;Decreased awareness of deficits   Problem Solving: Slow processing;Decreased initiation;Difficulty sequencing;Requires verbal cues;Requires tactile cues General Comments: Pt requires tactile cues to initiate movement to command. Pt with rt inattention.       Exercises      General Comments General comments (skin integrity, edema, etc.): VSS      Pertinent Vitals/Pain Pain Assessment: No/denies pain    Home Living                      Prior Function            PT Goals (current goals can now be found in the care plan section) Acute Rehab PT Goals Patient Stated Goal:  to be able to go home Progress towards PT goals: Progressing toward goals    Frequency    Min 3X/week      PT Plan Current plan remains appropriate    Co-evaluation              AM-PAC PT "6 Clicks" Mobility   Outcome Measure  Help needed turning from your back to your side while in a flat bed without using bedrails?: A Lot Help needed moving from lying on your back to sitting on the side of a flat bed without using bedrails?: A  Lot Help needed moving to and from a bed to a chair (including a wheelchair)?: Total Help needed standing up from a chair using your arms (e.g., wheelchair or bedside chair)?: A Lot Help needed to walk in hospital room?: Total Help needed climbing 3-5 steps with a railing? : Total 6 Click Score: 9    End of Session Equipment Utilized During Treatment: Gait belt Activity Tolerance: Patient tolerated treatment well;Patient limited by fatigue Patient left: in chair;with call bell/phone within reach;with chair alarm set Nurse Communication: Mobility status;Need for lift equipment PT Visit Diagnosis: Other abnormalities of gait and mobility (R26.89);Muscle weakness (generalized) (M62.81);Difficulty in walking, not elsewhere classified (R26.2)     Time: [id number] PT Time Calculation (min) (ACUTE ONLY): 21 min  Charges:  $Therapeutic Activity: 8-22 mins                     [place] Pager [phone] Office [place] 07/22/2019, 10:28 AM

## 2019-07-22 NOTE — Progress Notes (Signed)
Progress Note  Patient Name: Riley Eaton Date of Encounter: 07/22/2019  Primary Cardiologist: Quay Burow, MD   Subjective   Remains somewhat confused and very tired after PT.  Denies any CP or SOB.  Inpatient Medications    Scheduled Meds: . aspirin  81 mg Oral Daily  . Chlorhexidine Gluconate Cloth  6 each Topical Daily  . cloNIDine  0.1 mg Transdermal Weekly  . enoxaparin (LOVENOX) injection  40 mg Subcutaneous Q24H  . losartan  25 mg Oral Daily  . mouth rinse  15 mL Mouth Rinse BID  . metoprolol tartrate  25 mg Oral Q6H  . mupirocin ointment  1 application Nasal BID  . pantoprazole (PROTONIX) IV  40 mg Intravenous Q24H  . spironolactone  12.5 mg Oral Daily   Continuous Infusions: . potassium chloride 10 mEq (07/22/19 0806)  . vancomycin 1,500 mg (07/22/19 0120)   PRN Meds: acetaminophen, docusate, haloperidol lactate, hydrALAZINE, LORazepam, Melatonin, morphine injection, naLOXone (NARCAN)  injection, ondansetron (ZOFRAN) IV, Resource ThickenUp Clear   Vital Signs    Vitals:   07/22/19 0600 07/22/19 0700 07/22/19 0759 07/22/19 0800  BP: (!) 157/95 116/80  127/90  Pulse: 77 (!) 155  94  Resp: (!) 21 20  (!) 21  Temp:   (!) 97.1 F (36.2 C)   TempSrc:   Axillary   SpO2: 100% 100%  100%  Weight:      Height:        Intake/Output Summary (Last 24 hours) at 07/22/2019 0821 Last data filed at 07/22/2019 0600 Gross per 24 hour  Intake 1662.99 ml  Output 660 ml  Net 1002.99 ml   Last 3 Weights 07/22/2019 07/21/2019 07/20/2019  Weight (lbs) 147 lb 14.9 oz 143 lb 4.8 oz 143 lb 1.3 oz  Weight (kg) 67.1 kg 65 kg 64.9 kg      Telemetry    NSR. - Personally Reviewed  ECG    No new EKG to review. - Personally Reviewed  Physical Exam   GEN: Well nourished, well developed in no acute distress HEENT: Normal NECK: No JVD; No carotid bruits LYMPHATICS: No lymphadenopathy CARDIAC:RRR, no murmurs, rubs, gallops RESPIRATORY:  Clear to auscultation  without rales, wheezing or rhonchi  ABDOMEN: Soft, non-tender, non-distended MUSCULOSKELETAL:  No edema; No deformity  SKIN: Warm and dry NEUROLOGIC:  Alert and oriented x 3 PSYCHIATRIC:  Normal affect    Labs    High Sensitivity Troponin:   Recent Labs  Lab 07/14/19 1409 07/15/19 0841 07/15/19 1220 07/16/19 1251 07/18/19 0240  TROPONINIHS 974* 2,079* 2,110* 1,422* 289*      Chemistry Recent Labs  Lab 07/16/19 0409 07/18/19 0240 07/19/19 0302 07/20/19 0311 07/21/19 0049 07/22/19 0348  NA 146* 148* 144  --  139 144  K 3.1* 3.3* 3.2*  --  3.3* 2.9*  CL 114* 116* 109  --  105 109  CO2 21* 22 25  --  23 26  GLUCOSE 131* 120* 148*  --  127* 137*  BUN 42* 29* 17  --  17 20  CREATININE 0.82 0.79 0.62 0.60* 0.55* 0.70  CALCIUM 9.4 8.3* 8.4*  --  8.4* 8.6*  PROT 6.8 5.9*  --   --   --  5.5*  ALBUMIN 4.0 3.1*  --   --   --  3.2*  AST 62* 29  --   --   --  22  ALT 41 30  --   --   --  23  ALKPHOS  90 65  --   --   --  58  BILITOT 1.4* 0.9  --   --   --  1.0  GFRNONAA >60 >60 >60 >60 >60 >60  GFRAA >60 >60 >60 >60 >60 >60  ANIONGAP 11 10 10   --  11 9     Hematology Recent Labs  Lab 07/19/19 0302 07/21/19 0049 07/22/19 0348  WBC 9.3 8.8 8.2  RBC 3.69* 3.95* 3.98*  HGB 12.6* 13.5 13.6  HCT 34.7* 37.0* 38.2*  MCV 94.0 93.7 96.0  MCH 34.1* 34.2* 34.2*  MCHC 36.3* 36.5* 35.6  RDW 13.8 13.3 13.7  PLT 117* 188 227    BNPNo results for input(s): BNP, PROBNP in the last 168 hours.   DDimer No results for input(s): DDIMER in the last 168 hours.   Radiology    MR BRAIN WO CONTRAST  Result Date: 07/20/2019 CLINICAL DATA:  Focal neuro deficit, greater than 6 hours, stroke suspected. EXAM: MRI HEAD WITHOUT CONTRAST TECHNIQUE: Multiplanar, multiecho pulse sequences of the brain and surrounding structures were obtained without intravenous contrast. COMPARISON:  Brain MRI 07/17/2019, head CT 07/15/2019 FINDINGS: Brain: There is no evidence of acute infarct. No evidence of  intracranial mass. No midline shift or extra-axial fluid collection. No chronic intracranial blood products. Mild scattered T2/FLAIR hyperintensity within cerebral white matter is nonspecific, but consistent with chronic small vessel ischemic disease. Tiny chronic lacunar infarct within the left caudate. Redemonstrated nonspecific T2/FLAIR hyperintensity within the deep gray nuclei. Mild generalized parenchymal atrophy. Vascular: Flow voids maintained within the proximal large arterial vessels. Skull and upper cervical spine: No focal marrow lesion. 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 Sinuses/Orbits: Visualized orbits demonstrate no acute abnormality. Trace ethmoid sinus mucosal thickening. Bilateral mastoid effusions. IMPRESSION: 1. No evidence of acute intracranial abnormality, including acute infarct. 2. Mild generalized parenchymal atrophy and chronic small vessel ischemic disease. 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 3. Redemonstrated nonspecific T2/FLAIR hyperintensity within the bilateral deep gray nuclei. 4. Bilateral mastoid effusions. Electronically Signed   By: Kellie Simmering DO   On: 07/20/2019 21:19   DG Swallowing Func-Speech Pathology  Result Date: 07/21/2019 Objective Swallowing Evaluation: Type of Study: MBS-Modified Barium Swallow Study  Patient Details Name: ADRI LEGNER MRN: [id number] Date of Birth: 1941/07/17 Today's Date: 07/21/2019 Time: SLP Start Time (ACUTE ONLY): 0910 -SLP Stop Time (ACUTE ONLY): 0950 SLP Time Calculation (min) (ACUTE ONLY): 40 min Past Medical History: Past Medical History: Diagnosis Date . Chronic back pain  . Chronic low back pain 05/19/2017 . Chronic, continuous use of opioids   for back pain . Coronary artery calcification seen on CT scan  . Nerve pain  . Thoracic aortic aneurysm [facility])  Past Surgical History: Past Surgical History: Procedure Laterality Date . BACK SURGERY    09-2016 . back surgey   .  ENDOSCOPIC RETROGRADE CHOLANGIOPANCREATOGRAPHY (ERCP) WITH PROPOFOL N/A 09/25/2018  Procedure: ENDOSCOPIC RETROGRADE CHOLANGIOPANCREATOGRAPHY (ERCP) WITH PROPOFOL;  Surgeon: Ronnette Juniper, MD;  Location: [facility];  Service: Gastroenterology;  Laterality: N/A; . PANCREATIC STENT PLACEMENT  09/25/2018  Procedure: PANCREATIC STENT PLACEMENT;  Surgeon: Ronnette Juniper, MD;  Location: [facility] ENDOSCOPY;  Service: Gastroenterology;; . REMOVAL OF STONES  09/25/2018  Procedure: REMOVAL OF STONES;  Surgeon: Ronnette Juniper, MD;  Location: [facility] ENDOSCOPY;  Service: Gastroenterology;; . [name]  09/25/2018  Procedure: SPHINCTEROTOMY;  Surgeon: Ronnette Juniper, MD;  Location: [facility];  Service: Gastroenterology;; HPI: DEVONTA BOHREN is a 78 y.o.  male who presented to ED 12/1 with abd pain and AMS.  Had been seen 1 day earlier and CT abd / pelv showed cholelithiasis without cholecystitis. He does have hx of chronic low back pain and chronic opioid use. Intubated 12/4-12/7. 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 Pt experiences silent aspiration with thin, nectar and honey textures when bolus arrives at the pyriform sinuses just prior to swallow initiation, which pushes bolus into the laryngeal vestibule. Attempted controlling bolus size with all textures, only beneficial with honey teaspoon. Did not attempt positional strategies because pt was less attentive today, needed max verbal cues to clear throat or swallow again to clear aspirate, he was also reporting nausea and trying to refuse boluses. Pt may demosntrate improved performance with further recovery. For now, recommend puree textuers and honey thick liquids.  SLP Visit Diagnosis  Dysphagia, oropharyngeal phase (R13.12) Attention and concentration deficit following -- Frontal lobe and executive function deficit following -- Impact on safety and function Moderate aspiration risk   CHL IP TREATMENT RECOMMENDATION 07/21/2019 Treatment Recommendations Defer until completion of intrumental exam   Prognosis 09/29/2018 Prognosis for Safe Diet Advancement Good Barriers to Reach Goals -- Barriers/Prognosis Comment -- CHL IP DIET RECOMMENDATION 07/21/2019 SLP Diet Recommendations Dysphagia 1 (Puree) solids;Honey thick liquids Liquid Administration via Spoon Medication Administration -- Compensations Slow rate;Small sips/bites Postural Changes --   CHL IP OTHER RECOMMENDATIONS 07/21/2019 Recommended Consults -- Oral Care Recommendations Oral care QID Other Recommendations Order thickener from pharmacy;Have oral suction available   CHL IP FOLLOW UP RECOMMENDATIONS 07/21/2019 Follow up Recommendations Skilled Nursing facility   [facility] IP FREQUENCY AND DURATION 07/21/2019 Speech Therapy Frequency (ACUTE ONLY) min 2x/week Treatment Duration 2 weeks      CHL IP ORAL PHASE 07/21/2019 Oral Phase Impaired Oral - Pudding Teaspoon -- Oral - Pudding Cup -- Oral - Honey Teaspoon Right anterior bolus loss;Right pocketing in lateral sulci Oral - Honey Cup Right anterior bolus loss;Right pocketing in lateral sulci Oral - Nectar Teaspoon Right anterior bolus loss;Right pocketing in lateral sulci Oral - Nectar Cup Right anterior bolus loss;Right pocketing in lateral sulci Oral - Nectar Straw Right anterior bolus loss;Right pocketing in lateral sulci Oral - Thin Teaspoon -- Oral - Thin Cup Right anterior bolus loss;Right pocketing in lateral sulci Oral - Thin Straw Right anterior bolus loss;Right pocketing in lateral sulci Oral - Puree Right anterior bolus loss;Right pocketing in lateral sulci Oral - Mech Soft -- Oral - Regular Right anterior bolus loss;Right pocketing in lateral sulci Oral - Multi-Consistency -- Oral - Pill --  Oral Phase - Comment --  CHL IP PHARYNGEAL PHASE 07/21/2019 Pharyngeal Phase Impaired Pharyngeal- Pudding Teaspoon -- Pharyngeal -- Pharyngeal- Pudding Cup -- Pharyngeal -- Pharyngeal- Honey Teaspoon Delayed swallow initiation-vallecula Pharyngeal Material does not enter airway Pharyngeal- Honey Cup Delayed swallow initiation-pyriform sinuses;Penetration/Aspiration during swallow Pharyngeal Material enters airway, passes BELOW cords without attempt by patient to eject out (silent aspiration) Pharyngeal- Nectar Teaspoon Penetration/Aspiration during swallow Pharyngeal Material enters airway, passes BELOW cords without attempt by patient to eject out (silent aspiration) Pharyngeal- Nectar Cup Penetration/Aspiration during swallow;Delayed swallow initiation-pyriform sinuses Pharyngeal Material enters airway, passes BELOW cords without attempt by patient to eject out (silent aspiration) Pharyngeal- Nectar Straw Penetration/Aspiration during swallow Pharyngeal Material  enters airway, passes BELOW cords without attempt by patient to eject out (silent aspiration) Pharyngeal- Thin Teaspoon -- Pharyngeal -- Pharyngeal- Thin Cup Penetration/Aspiration during swallow Pharyngeal Material enters airway, passes BELOW cords without attempt by patient to eject out (silent aspiration) Pharyngeal- Thin Straw Penetration/Aspiration during swallow Pharyngeal Material enters airway, passes BELOW cords without attempt by patient to eject out (silent aspiration) Pharyngeal- Puree Delayed swallow initiation-vallecula Pharyngeal -- Pharyngeal- Mechanical Soft Delayed swallow initiation-vallecula Pharyngeal -- Pharyngeal- Regular -- Pharyngeal -- Pharyngeal- Multi-consistency -- Pharyngeal -- Pharyngeal- Pill -- Pharyngeal -- Pharyngeal Comment --  No flowsheet data found. DeBlois, Katherene Ponto 07/21/2019, 12:28 PM               Cardiac Studies   Echocardiogram 07/14/2019: 1. Technically difficult study. Left ventricular ejection  fraction, by visual estimation, is 30 to 35%. The left ventricle has grossly severely decreased function. Images are not sufficient to assess for regional wall motion abnormalities. There is  mildly increased left ventricular hypertrophy. 2. Left ventricular diastolic parameters are consistent with Grade I diastolic dysfunction (impaired relaxation). 3. Global right ventricle has normal systolic function.The right ventricular size is normal. 4. The mitral valve is normal in structure. No evidence of mitral valve regurgitation. 5. The tricuspid valve is not well visualized. Tricuspid valve regurgitation is not demonstrated. 6. The aortic valve was not well visualized. Aortic valve regurgitation is not visualized. 7. The pulmonic valve was not well visualized. Pulmonic valve regurgitation is not visualized.  Patient Profile     78 y.o.malewith a hx of chronic pain, opoid use, prior pancreatic stent placement, thoracic aortic aneurysm, coronary artery calcification seen on chest CTandelevated troponin in the setting of AMS and chololithiasiswith decrease in EF from 2017.  Assessment & Plan    NSTEMI - EKG on 07/16/2019 showed deep T wave inversions in inferior and anterolateral leads. New compared to initial EKG on admission. - High-sensitivity troponin elevated peaked at 2,110 on 07/15/2019.  - Echo on 07/14/2019 showed LVEF of 30-35% with mild LVH and grade 1 diastolic dysfunction. Images not sufficient to assess wall motion abnormalities. - Coronary calcification noted on CT scan in 09/2018.  - Patient denies any recent chest pain. - He ultimately needs cardiac catheterization; however, not a great candidate at this time due to unclear source of infection and unclear if he would be able to do DAPT without interruption. Would also like to see mental status improve. 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 - No acute infection on abdominal/pelvic CT on 07/18/2019. - HIDA scan on 07/19/2019 showed patent cystic duct without evidence for acute cholecystitis. Did have delayed filling of gallbladder following morphine administration which can be seen in chronic cholecystitis. - Last fever on 07/18/2019. - Patient developed right sided weakness and facial droop concerning for stroke but brain MRI negative for acute infarction. - No clear cause of AMS at this time.  Acute Respiratory Failure - Required intubation but has since been extubated. - Management per primary team.  Hypokalemia -K+ 2.9 -replete per TRH  Otherwise, per primary team.  For questions or  updates, please contact [facility] Please consult [email or web address] for contact info under        Signed, Fransico Him, MD  07/22/2019, 8:21 AM

## 2019-07-22 NOTE — Progress Notes (Signed)
PROGRESS NOTE  Riley Eaton [id number] DOB: 09-24-1940 DOA: 07/13/2019 PCP: Aletha Halim., PA-C   LOS: 9 days   Brief narrative/HPI:  Riley Eaton is a 78 y.o. male with PMH including but not limited to thoracic aortic aneurysm, CAD, chronic back pain, chronic opioid use  presented to [facility] ED 12/1 with altered mental status and abd pain.  He had been seen in [facility] ED 1 day prior for abd pain.    Patient had CT of the abd at the time that demonstrated cholelithiasis without cholecystitis.  Surgery was consulted and recommended outpatient follow up.  He was subsequently discharged home.  On 12/1 patient had worsening of pain along with AMS; therefore, came to [facility] ED.  He was quite agitated per reports and received 1mg  dilaudid, 5mg  haldol, 1mg  ativan x 3 and had minimal response.   PCCM was asked to see in consultation for consideration of precedex.  Per chart review, he has no underlying hx of EtOH abuse or substance abuse.  He does have hx of chronic low back pain and chronic opioid use.  On review of outpatient med list, he had 5-325 norco prescribed for q6hrs PRN, 40mg  oxycontin TID, lyrica 200mg  TID, duloxetine 60mg  BID.  Per chart review, he had admission 09/24/18 through 10/06/18 for acute ascending cholangitis s/p ERCP and sphincterotomy with stenting of pancreatic duct (Dr. Therisa Doyne).  During that admission, he did have severe delirium felt to be due to narcotic withdrawal.  He did spend a few days in ICU and was briefly treated with precedex and fentanyl.  He was discharged and supposed to be evaluated by surgery as an outpatient for cholecystectomy; however, this was postponed due to the [place] pandemic.  Per discharge summary, his medication regimen was adjusted prior to d/c and pt had been tolerating it well (oxycodone weaned from 80mg  BID to BID, lyrica reduced from 200mg  TID to 100mg  TID, cymbalta from 60mg  BID to 60mg  daily).   Assessment/Plan:  Active Problems:   Chronic  low back pain   Choledocholithiasis   Acute metabolic encephalopathy   Mental status alteration   ACS (acute coronary syndrome) (HCC)   Endotracheal tube present   Alteration in nutrition   Encephalopathy   Abdominal pain   Non-ST elevation (NSTEMI) myocardial infarction (HCC)   Hypokalemia   DCM (dilated cardiomyopathy) ([place])  Acute metabolic encephalopathy: improved. CT scan of the head and MRI of the head was negative.  Lactate, procalcitonin TSH and ammonia within normal limits.  Less likely opioid withdrawal. LP deferred due to clinical improvement.  I spoke with the family and confirmed that patient was not on fentanyl patch at home and was only on OxyContin.  Blood cultures negative in 4 days.  Acute resp failure:  intubated for airway protection; status post extubation at this time.  On room air currently.  MRSA in sputum, will continue vancomycin for total of 10 days. No leukocytosis.  Afebrile and stable  Abd pain - unclear etiology; though, concerning for cholecystitis.  Had ERCP with sphincterotomy and pancreatic duct stenting on 09/25/18. CT x 2 shows only cholelithiasis but no active disease. -Off rocephin and flagyl.  HIDA scan showed patent cystic duct without acute cholecystitis.  No abdominal pain today.  LFTs within normal limits except for albumin  Prolonged qt:  - Avoid prolonging agents, - Recheck prn, monitor electrolytes.  Elevated trop/NSTEMI: - Trop to >2K, now trending down.  No chest pain. -Cardiology on board.  On aspirin.  Cardiology waiting for clinical improvement prior to cardiac catheterization.  HFrEF:  - Newly found on echo EF, normal in 2017 -Cardiology on board.  Will need ischemic work-up  Essential HTN:   -On Coreg, spironolactone and losartan at this time.  Continue clonidine patch.  Cardiology on board.,.  Blood pressure has improved.   hypokalemia.  Potassium of 2.9 despite replacement.  Will give IV potassium x4.  Check BMP in a.m..   Magnesium this morning at 1.9 and phosphorus 2.5.   Dysphagia, poor oral nutrition..  Seen by speech therapy.  Recommend dysphagia 1 diet.  Dietary recommended tube feeding if inadequate oral intake.  MRI of the brain was negative for any stroke.  Seen by neurology for concerns of a stroke  Deconditioning, debility.  Seen by physical therapy.  Inpatient rehabilitation has been consulted, follow recommendations.  VTE Prophylaxis: Lovenox   Code Status: Full code  Family Communication: I spoke with the patient's daughter on the phone and updated her about the clinical condition of the patient.  Answered all the questions he had.   Disposition Plan: CIR.  Patient has been seen by PT and recommended CIR .  Awaiting for ischemic work-up by cardiology.   Consultants:  [place]  Cardiology  Neurology  Procedures:  Intubation and mechanical ventilation.  Extubation  Antibiotics: Ampicillin 12/4 >12/6 Acyclovir 12/4 >12/6 Ceftriaxone 12/1 > 12/8 Flagyl 12/1 > 12/8 Vancomycin 12/4 >12/14  Anti-infectives (From admission, onward)   Start     Dose/Rate Route Frequency Ordered Stop   07/21/19 0100  vancomycin (VANCOCIN) 1,500 mg in sodium chloride 0.9 % 500 mL IVPB     1,500 mg 250 mL/hr over 120 Minutes Intravenous Every 12 hours 07/20/19 1608 07/26/19 2359   07/20/19 1018  cefTRIAXone (ROCEPHIN) 2 g in sodium chloride 0.9 % 100 mL IVPB  Status:  Discontinued     2 g 200 mL/hr over 30 Minutes Intravenous Every 24 hours 07/19/19 1115 07/20/19 0800   07/20/19 1018  cefTRIAXone (ROCEPHIN) 2 g in sodium chloride 0.9 % 100 mL IVPB  Status:  Discontinued     2 g 200 mL/hr over 30 Minutes Intravenous Every 24 hours 07/20/19 0800 07/20/19 0921   07/17/19 0100  vancomycin (VANCOCIN) IVPB 750 mg/150 ml premix  Status:  Discontinued     750 mg 150 mL/hr over 60 Minutes Intravenous Every 12 hours 07/16/19 1240 07/20/19 1608   07/16/19 1400  acyclovir (ZOVIRAX) 700 mg in dextrose 5 % 100  mL IVPB  Status:  Discontinued     10 mg/kg  69.9 kg 114 mL/hr over 60 Minutes Intravenous Every 8 hours 07/16/19 1240 07/19/19 1115   07/16/19 1245  cefTRIAXone (ROCEPHIN) 2 g in sodium chloride 0.9 % 100 mL IVPB  Status:  Discontinued     2 g 200 mL/hr over 30 Minutes Intravenous Every 12 hours 07/16/19 1240 07/19/19 1115   07/16/19 1245  vancomycin (VANCOCIN) 1,500 mg in sodium chloride 0.9 % 500 mL IVPB     1,500 mg 250 mL/hr over 120 Minutes Intravenous  Once 07/16/19 1240 07/16/19 1609   07/16/19 1245  ampicillin (OMNIPEN) 2 g in sodium chloride 0.9 % 100 mL IVPB  Status:  Discontinued     2 g 300 mL/hr over 20 Minutes Intravenous Every 4 hours 07/16/19 1240 07/19/19 1115   07/13/19 1900  cefTRIAXone (ROCEPHIN) 1 g in sodium chloride 0.9 % 100 mL IVPB  Status:  Discontinued     1 g 200  mL/hr over 30 Minutes Intravenous Every 24 hours 07/13/19 1825 07/16/19 1240   07/13/19 1900  metroNIDAZOLE (FLAGYL) IVPB 500 mg  Status:  Discontinued     500 mg 100 mL/hr over 60 Minutes Intravenous Every 8 hours 07/13/19 1825 07/20/19 0921     Subjective: Today, patient denies any chest pain, nausea or vomiting.  No fever chills or rigor.  Rectal tube has been discontinued.  Denies any fever chills or rigor.  Denies any shortness of breath.  Objective: Vitals:   07/22/19 0759 07/22/19 0800  BP:  127/90  Pulse:  94  Resp:  (!) 21  Temp: (!) 97.1 F (36.2 C)   SpO2:  100%    Intake/Output Summary (Last 24 hours) at 07/22/2019 0812 Last data filed at 07/22/2019 0600 Gross per 24 hour  Intake 1662.99 ml  Output 660 ml  Net 1002.99 ml   Filed Weights   07/20/19 0500 07/21/19 0500 07/22/19 0315  Weight: 64.9 kg 65 kg 67.1 kg   Body mass index is 23.17 kg/m.   Physical Exam: General: Alert awake communicative.  Chronically ill and deconditioned.  On room air. HENT: Normocephalic, pupils equally reacting to light and accommodation.  No scleral pallor or icterus noted. Oral mucosa is  moist.  Chest: Decreased breath sounds bilaterally CVS: S1 &S2 heard. No murmur.  Regular rate and rhythm. Abdomen: Soft, nontender, nondistended.  Bowel sounds are heard.  Liver is not palpable, no abdominal mass palpated.  Off rectal tube today. Extremities: No cyanosis, clubbing or edema.  Peripheral pulses are palpable. Psych: Alert, awake and oriented, normal mood CNS:  No cranial nerve deficits.  Power equal in all extremities.  No sensory deficits noted.  No cerebellar signs.   Skin: Warm and dry.  No rashes noted.   Data Review: I have personally reviewed the following laboratory data and studies,  CBC: Recent Labs  Lab 07/16/19 0409 07/17/19 1632 07/18/19 0240 07/19/19 0302 07/21/19 0049 07/22/19 0348  WBC 10.4 6.2 7.1 9.3 8.8 8.2  NEUTROABS 8.6* 4.9 5.5 7.3  --   --   HGB 14.7 11.9* 12.5* 12.6* 13.5 13.6  HCT 40.1 33.6* 36.0* 34.7* 37.0* 38.2*  MCV 92.4 96.8 97.6 94.0 93.7 96.0  PLT 157 113* 122* 117* 188 [id number]   Basic Metabolic Panel: Recent Labs  Lab 07/15/19 1220 07/16/19 0409 07/16/19 1357 07/18/19 0240 07/19/19 0302 07/20/19 0311 07/21/19 0049 07/22/19 0348  NA  --  146* 150* 148* 144  --  139 144  K  --  3.1* 3.0* 3.3* 3.2*  --  3.3* 2.9*  CL  --  114*  --  116* 109  --  105 109  CO2  --  21*  --  22 25  --  23 26  GLUCOSE  --  131*  --  120* 148*  --  127* 137*  BUN  --  42*  --  29* 17  --  17 20  CREATININE  --  0.82  --  0.79 0.62 0.60* 0.55* 0.70  CALCIUM  --  9.4  --  8.3* 8.4*  --  8.4* 8.6*  MG 2.2  --   --   --   --   --  1.8 1.9  PHOS  --   --   --   --   --   --  3.1 2.5   Liver Function Tests: Recent Labs  Lab 07/16/19 0409 07/18/19 0240 07/22/19 0348  AST 62* 29 22  ALT 41 30 23  ALKPHOS 90 65 58  BILITOT 1.4* 0.9 1.0  PROT 6.8 5.9* 5.5*  ALBUMIN 4.0 3.1* 3.2*   No results for input(s): LIPASE, AMYLASE in the last 168 hours. Recent Labs  Lab 07/16/19 1130 07/18/19 1515  AMMONIA 37* 40*   Cardiac Enzymes: No results for  input(s): CKTOTAL, CKMB, CKMBINDEX, TROPONINI in the last 168 hours. BNP (last 3 results) No results for input(s): BNP in the last 8760 hours.  ProBNP (last 3 results) No results for input(s): PROBNP in the last 8760 hours.  CBG: Recent Labs  Lab 07/21/19 1131 07/21/19 1514 07/21/19 1945 07/22/19 0303 07/22/19 0743  GLUCAP 158* 193* 160* 115* 121*   Recent Results (from the past 240 hour(s))  Blood cultures x2     Status: None   Collection Time: 07/13/19  6:44 PM   Specimen: BLOOD  Result Value Ref Range Status   Specimen Description BLOOD RIGHT ANTECUBITAL  Final   Special Requests   Final    BOTTLES DRAWN AEROBIC AND ANAEROBIC Blood Culture adequate volume   Culture   Final    NO GROWTH 5 DAYS Performed at [facility], [place] [address], [place], [place] [address]    Report Status 07/18/2019 FINAL  Final  SARS Coronavirus 2 by RT PCR (hospital order, performed in [facility] hospital lab) Nasopharyngeal Nasopharyngeal Swab     Status: None   Collection Time: 07/13/19  9:52 PM   Specimen: Nasopharyngeal Swab  Result Value Ref Range Status   SARS Coronavirus 2 NEGATIVE NEGATIVE Final    Comment: (NOTE) SARS-CoV-2 target nucleic acids are NOT DETECTED. The SARS-CoV-2 RNA is generally detectable in upper and lower respiratory specimens during the acute phase of infection. The lowest concentration of SARS-CoV-2 viral copies this assay can detect is 250 copies / mL. A negative result does not preclude SARS-CoV-2 infection and should not be used as the sole basis for treatment or other patient management decisions.  A negative result may occur with improper specimen collection / handling, submission of specimen other than nasopharyngeal swab, presence of viral mutation(s) within the areas targeted by this assay, and inadequate number of viral copies (<250 copies / mL). A negative result must be combined with clinical observations, patient history, and epidemiological  information. Fact Sheet for Patients:   StrictlyIdeas.no Fact Sheet for Healthcare Providers: BankingDealers.co.za This test is not yet approved or cleared  by the [place] FDA and has been authorized for detection and/or diagnosis of SARS-CoV-2 by FDA under an Emergency Use Authorization (EUA).  This EUA will remain in effect (meaning this test can be used) for the duration of the COVID-19 declaration under Section 564(b)(1) of the Act, 21 U.S.C. section 360bbb-3(b)(1), unless the authorization is terminated or revoked sooner. Performed at [facility], [place] [address] [place],  [address]   Culture, Urine     Status: None   Collection Time: 07/18/19  8:13 AM   Specimen: Urine, Catheterized  Result Value Ref Range Status   Specimen Description URINE, CATHETERIZED  Final   Special Requests NONE  Final   Culture   Final    NO GROWTH Performed at [facility], [address], [place],  [address]    Report Status 07/19/2019 FINAL  Final  Culture, respiratory (non-expectorated)     Status: None   Collection Time: 07/18/19  8:13 AM   Specimen: Tracheal Aspirate; Respiratory  Result Value Ref Range Status  Specimen Description TRACHEAL ASPIRATE  Final   Special Requests NONE  Final   Gram Stain   Final    FEW WBC PRESENT, PREDOMINANTLY PMN RARE GRAM POSITIVE COCCI IN CLUSTERS Performed at [facility], [place] [address] [place], [place] [address]    Culture   Final    FEW STAPHYLOCOCCUS AUREUS RARE STENOTROPHOMONAS MALTOPHILIA    Report Status 07/21/2019 FINAL  Final   Organism ID, Bacteria STAPHYLOCOCCUS AUREUS  Final   Organism ID, Bacteria STENOTROPHOMONAS MALTOPHILIA  Final      Susceptibility   Staphylococcus aureus - MIC*    CIPROFLOXACIN <=0.5 SENSITIVE Sensitive     ERYTHROMYCIN RESISTANT Resistant     GENTAMICIN <=0.5 SENSITIVE Sensitive     OXACILLIN >=4 RESISTANT Resistant      TETRACYCLINE <=1 SENSITIVE Sensitive     VANCOMYCIN 1 SENSITIVE Sensitive     TRIMETH/SULFA <=10 SENSITIVE Sensitive     CLINDAMYCIN RESISTANT Resistant     RIFAMPIN <=0.5 SENSITIVE Sensitive     Inducible Clindamycin POSITIVE Resistant     * FEW STAPHYLOCOCCUS AUREUS   Stenotrophomonas maltophilia - MIC*    LEVOFLOXACIN 4 INTERMEDIATE Intermediate     TRIMETH/SULFA <=20 SENSITIVE Sensitive     * RARE STENOTROPHOMONAS MALTOPHILIA  Culture, blood (routine x 2)     Status: None (Preliminary result)   Collection Time: 07/18/19  9:24 AM   Specimen: BLOOD  Result Value Ref Range Status   Specimen Description BLOOD LEFT THUMB  Final   Special Requests   Final    BOTTLES DRAWN AEROBIC AND ANAEROBIC Blood Culture adequate volume   Culture   Final    NO GROWTH 3 DAYS Performed at [facility], [address] [place], [place] [address]    Report Status PENDING  Incomplete  Culture, blood (routine x 2)     Status: None (Preliminary result)   Collection Time: 07/18/19  9:30 AM   Specimen: BLOOD RIGHT HAND  Result Value Ref Range Status   Specimen Description BLOOD RIGHT HAND  Final   Special Requests   Final    BOTTLES DRAWN AEROBIC AND ANAEROBIC Blood Culture adequate volume   Culture   Final    NO GROWTH 3 DAYS Performed at [facility], [place] [address], [place], [place] [address]    Report Status PENDING  Incomplete  MRSA PCR Screening     Status: Abnormal   Collection Time: 07/19/19  9:01 AM   Specimen: Nasal Mucosa; Nasopharyngeal  Result Value Ref Range Status   MRSA by PCR POSITIVE (A) NEGATIVE Final    Comment:        The GeneXpert MRSA Assay (FDA approved for NASAL specimens only), is one component of a comprehensive MRSA colonization surveillance program. It is not intended to diagnose MRSA infection nor to guide or monitor treatment for MRSA infections. RESULT CALLED TO, READ BACK BY AND VERIFIED WITH: Lattie Haw RN 11:10 07/19/19 ([email or web address]) Performed at [facility], [place] [address] [place], [place] [address]      Studies: MR BRAIN WO CONTRAST  Result Date: 07/20/2019 CLINICAL DATA:  Focal neuro deficit, greater than 6 hours, stroke suspected. EXAM: MRI HEAD WITHOUT CONTRAST TECHNIQUE: Multiplanar, multiecho pulse sequences of the brain and surrounding structures were obtained without intravenous contrast. COMPARISON:  Brain MRI 07/17/2019, head CT 07/15/2019 FINDINGS: Brain: There is no evidence of acute infarct. No evidence of intracranial mass. No midline shift or extra-axial fluid collection. No chronic  intracranial blood products. Mild scattered T2/FLAIR hyperintensity within cerebral white matter is nonspecific, but consistent with chronic small vessel ischemic disease. Tiny chronic lacunar infarct within the left caudate. Redemonstrated nonspecific T2/FLAIR hyperintensity within the deep gray nuclei. Mild generalized parenchymal atrophy. Vascular: Flow voids maintained within the proximal large arterial vessels. Skull and upper cervical spine: No focal marrow lesion. 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 Sinuses/Orbits: Visualized orbits demonstrate no acute abnormality. Trace ethmoid sinus mucosal thickening. Bilateral mastoid effusions. IMPRESSION: 1. No evidence of acute intracranial abnormality, including acute infarct. 2. Mild generalized parenchymal atrophy and chronic small vessel ischemic disease. 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 3. Redemonstrated nonspecific T2/FLAIR hyperintensity within the bilateral deep gray nuclei. 4. Bilateral mastoid effusions. Electronically Signed   By: Kellie Simmering DO   On: 07/20/2019 21:19   DG Swallowing Func-Speech Pathology  Result Date: 07/21/2019 Objective Swallowing Evaluation: Type of Study: MBS-Modified Barium Swallow Study  Patient Details Name: Riley Eaton MRN: [id number] Date of Birth: 01/08/1941 Today's Date: 07/21/2019 Time: SLP  Start Time (ACUTE ONLY): 0910 -SLP Stop Time (ACUTE ONLY): [id number] SLP Time Calculation (min) (ACUTE ONLY): 40 min Past Medical History: Past Medical History: Diagnosis Date  Chronic back pain   Chronic low back pain 05/19/2017  Chronic, continuous use of opioids   for back pain  Coronary artery calcification seen on CT scan   Nerve pain   Thoracic aortic aneurysm [facility])  Past Surgical History: Past Surgical History: Procedure Laterality Date  BACK SURGERY    09-2016  back surgey    ENDOSCOPIC RETROGRADE CHOLANGIOPANCREATOGRAPHY (ERCP) WITH PROPOFOL N/A 09/25/2018  Procedure: ENDOSCOPIC RETROGRADE CHOLANGIOPANCREATOGRAPHY (ERCP) WITH PROPOFOL;  Surgeon: Ronnette Juniper, MD;  Location: [facility];  Service: Gastroenterology;  Laterality: N/A;  PANCREATIC STENT PLACEMENT  09/25/2018  Procedure: PANCREATIC STENT PLACEMENT;  Surgeon: Ronnette Juniper, MD;  Location: [facility] ENDOSCOPY;  Service: Gastroenterology;;  REMOVAL OF STONES  09/25/2018  Procedure: REMOVAL OF STONES;  Surgeon: Ronnette Juniper, MD;  Location: [facility] ENDOSCOPY;  Service: Gastroenterology;;  [name]  09/25/2018  Procedure: [name];  Surgeon: Ronnette Juniper, MD;  Location: [facility] ENDOSCOPY;  Service: Gastroenterology;; HPI: Riley Eaton is a 78 y.o. male who presented to ED 12/1 with abd pain and AMS.  Had been seen 1 day earlier and CT abd / pelv showed cholelithiasis without cholecystitis. He does have hx of chronic low back pain and chronic opioid use. Intubated 12/4-12/7. 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 Pt experiences silent aspiration with thin, nectar and honey textures when bolus arrives at the pyriform sinuses just prior to swallow initiation, which  pushes bolus into the laryngeal vestibule. Attempted controlling bolus size with all textures, only beneficial with honey teaspoon. Did not attempt positional strategies because pt was less attentive today, needed max verbal cues to clear throat or swallow again to clear aspirate, he was also reporting nausea and trying to refuse boluses. Pt may demosntrate improved performance with further recovery. For now, recommend puree textuers and honey thick liquids.  SLP Visit Diagnosis Dysphagia, oropharyngeal phase (R13.12) Attention and concentration deficit following -- Frontal lobe and executive function deficit following -- Impact on safety and function Moderate aspiration risk   CHL  IP TREATMENT RECOMMENDATION 07/21/2019 Treatment Recommendations Defer until completion of intrumental exam   Prognosis 09/29/2018 Prognosis for Safe Diet Advancement Good Barriers to Reach Goals -- Barriers/Prognosis Comment -- CHL IP DIET RECOMMENDATION 07/21/2019 SLP Diet Recommendations Dysphagia 1 (Puree) solids;Honey thick liquids Liquid Administration via Spoon Medication Administration -- Compensations Slow rate;Small sips/bites Postural Changes --   CHL IP OTHER RECOMMENDATIONS 07/21/2019 Recommended Consults -- Oral Care Recommendations Oral care QID Other Recommendations Order thickener from pharmacy;Have oral suction available   CHL IP FOLLOW UP RECOMMENDATIONS 07/21/2019 Follow up Recommendations Skilled Nursing facility   [facility] IP FREQUENCY AND DURATION 07/21/2019 Speech Therapy Frequency (ACUTE ONLY) min 2x/week Treatment Duration 2 weeks      CHL IP ORAL PHASE 07/21/2019 Oral Phase Impaired Oral - Pudding Teaspoon -- Oral - Pudding Cup -- Oral - Honey Teaspoon Right anterior bolus loss;Right pocketing in lateral sulci Oral - Honey Cup Right anterior bolus loss;Right pocketing in lateral sulci Oral - Nectar Teaspoon Right anterior bolus loss;Right pocketing in lateral sulci Oral - Nectar Cup Right anterior bolus loss;Right pocketing  in lateral sulci Oral - Nectar Straw Right anterior bolus loss;Right pocketing in lateral sulci Oral - Thin Teaspoon -- Oral - Thin Cup Right anterior bolus loss;Right pocketing in lateral sulci Oral - Thin Straw Right anterior bolus loss;Right pocketing in lateral sulci Oral - Puree Right anterior bolus loss;Right pocketing in lateral sulci Oral - Mech Soft -- Oral - Regular Right anterior bolus loss;Right pocketing in lateral sulci Oral - Multi-Consistency -- Oral - Pill -- Oral Phase - Comment --  CHL IP PHARYNGEAL PHASE 07/21/2019 Pharyngeal Phase Impaired Pharyngeal- Pudding Teaspoon -- Pharyngeal -- Pharyngeal- Pudding Cup -- Pharyngeal -- Pharyngeal- Honey Teaspoon Delayed swallow initiation-vallecula Pharyngeal Material does not enter airway Pharyngeal- Honey Cup Delayed swallow initiation-pyriform sinuses;Penetration/Aspiration during swallow Pharyngeal Material enters airway, passes BELOW cords without attempt by patient to eject out (silent aspiration) Pharyngeal- Nectar Teaspoon Penetration/Aspiration during swallow Pharyngeal Material enters airway, passes BELOW cords without attempt by patient to eject out (silent aspiration) Pharyngeal- Nectar Cup Penetration/Aspiration during swallow;Delayed swallow initiation-pyriform sinuses Pharyngeal Material enters airway, passes BELOW cords without attempt by patient to eject out (silent aspiration) Pharyngeal- Nectar Straw Penetration/Aspiration during swallow Pharyngeal Material enters airway, passes BELOW cords without attempt by patient to eject out (silent aspiration) Pharyngeal- Thin Teaspoon -- Pharyngeal -- Pharyngeal- Thin Cup Penetration/Aspiration during swallow Pharyngeal Material enters airway, passes BELOW cords without attempt by patient to eject out (silent aspiration) Pharyngeal- Thin Straw Penetration/Aspiration during swallow Pharyngeal Material enters airway, passes BELOW cords without attempt by patient to eject out (silent aspiration)  Pharyngeal- Puree Delayed swallow initiation-vallecula Pharyngeal -- Pharyngeal- Mechanical Soft Delayed swallow initiation-vallecula Pharyngeal -- Pharyngeal- Regular -- Pharyngeal -- Pharyngeal- Multi-consistency -- Pharyngeal -- Pharyngeal- Pill -- Pharyngeal -- Pharyngeal Comment --  No flowsheet data found. DeBlo[place], Katherene Ponto 07/21/2019, 12:28 PM               Scheduled Meds:  aspirin  81 mg Oral Daily   Chlorhexidine Gluconate Cloth  6 each Topical Daily   cloNIDine  0.1 mg Transdermal Weekly   enoxaparin (LOVENOX) injection  40 mg Subcutaneous Q24H   losartan  25 mg Oral Daily   mouth rinse  15 mL Mouth Rinse BID   metoprolol tartrate  25 mg Oral Q6H   mupirocin ointment  1 application Nasal BID   pantoprazole (PROTONIX) IV  40 mg Intravenous Q24H   spironolactone  12.5 mg Oral Daily    Continuous  Infusions:  potassium chloride 10 mEq (07/22/19 0806)   vancomycin 1,500 mg (07/22/19 0120)     Flora Lipps, MD  [facility] 07/22/2019

## 2019-07-23 LAB — CBC
HCT: 35.6 % — ABNORMAL LOW (ref 39.0–52.0)
Hemoglobin: 12.8 g/dL — ABNORMAL LOW (ref 13.0–17.0)
MCH: 33.7 pg (ref 26.0–34.0)
MCHC: 36 g/dL (ref 30.0–36.0)
MCV: 93.7 fL (ref 80.0–100.0)
Platelets: 216 10*3/uL (ref 150–400)
RBC: 3.8 MIL/uL — ABNORMAL LOW (ref 4.22–5.81)
RDW: 13.5 % (ref 11.5–15.5)
WBC: 6.8 10*3/uL (ref 4.0–10.5)
nRBC: 0 % (ref 0.0–0.2)

## 2019-07-23 LAB — COMPREHENSIVE METABOLIC PANEL
ALT: 21 U/L (ref 0–44)
AST: 24 U/L (ref 15–41)
Albumin: 3.1 g/dL — ABNORMAL LOW (ref 3.5–5.0)
Alkaline Phosphatase: 56 U/L (ref 38–126)
Anion gap: 12 (ref 5–15)
BUN: 13 mg/dL (ref 8–23)
CO2: 21 mmol/L — ABNORMAL LOW (ref 22–32)
Calcium: 8.5 mg/dL — ABNORMAL LOW (ref 8.9–10.3)
Chloride: 107 mmol/L (ref 98–111)
Creatinine, Ser: 0.63 mg/dL (ref 0.61–1.24)
GFR calc Af Amer: >60 mL/min (ref >60)
GFR calc non Af Amer: >60 mL/min (ref >60)
Glucose, Bld: 130 mg/dL — ABNORMAL HIGH (ref 70–99)
Potassium: 2.8 mmol/L — ABNORMAL LOW (ref 3.5–5.1)
Sodium: 140 mmol/L (ref 135–145)
Total Bilirubin: 1.3 mg/dL — ABNORMAL HIGH (ref 0.3–1.2)
Total Protein: 5.5 g/dL — ABNORMAL LOW (ref 6.5–8.1)

## 2019-07-23 LAB — GLUCOSE, CAPILLARY
Glucose-Capillary: 115 mg/dL — ABNORMAL HIGH (ref 70–99)
Glucose-Capillary: 116 mg/dL — ABNORMAL HIGH (ref 70–99)
Glucose-Capillary: 103 mg/dL — ABNORMAL HIGH (ref 70–99)
Glucose-Capillary: 167 mg/dL — ABNORMAL HIGH (ref 70–99)

## 2019-07-23 LAB — CULTURE, BLOOD (ROUTINE X 2)
Culture: NO GROWTH
Culture: NO GROWTH
Special Requests: ADEQUATE
Special Requests: ADEQUATE

## 2019-07-23 LAB — PHOSPHORUS: Phosphorus: 2.5 mg/dL (ref 2.5–4.6)

## 2019-07-23 LAB — MAGNESIUM: Magnesium: 1.8 mg/dL (ref 1.7–2.4)

## 2019-07-23 MED ORDER — POTASSIUM CHLORIDE 10 MEQ/100ML IV SOLN
10.0000 meq | INTRAVENOUS | Status: AC
  Administered 2019-07-23 (×4): 10 meq via INTRAVENOUS
  Filled 2019-07-23 (×2): qty 100

## 2019-07-23 MED ORDER — POTASSIUM CHLORIDE CRYS ER 20 MEQ PO TBCR
40.0000 meq | EXTENDED_RELEASE_TABLET | Freq: Once | ORAL | Status: AC
  Administered 2019-07-23: 40 meq via ORAL
  Filled 2019-07-23: qty 2

## 2019-07-23 MED ORDER — LOSARTAN POTASSIUM 50 MG PO TABS
50.0000 mg | ORAL_TABLET | Freq: Every day | ORAL | Status: DC
  Administered 2019-07-23 – 2019-07-26 (×4): 50 mg via ORAL
  Filled 2019-07-23 (×4): qty 1

## 2019-07-23 MED ORDER — POTASSIUM CHLORIDE 10 MEQ/100ML IV SOLN
10.0000 meq | INTRAVENOUS | Status: AC
  Administered 2019-07-23 (×5): 10 meq via INTRAVENOUS
  Filled 2019-07-23 (×4): qty 100

## 2019-07-23 NOTE — Progress Notes (Signed)
Pharmacy Antibiotic Note  Riley Eaton is a 78 y.o. male admitted on 07/13/2019 and today with worse mental and respiratory status requiring intubation. Initially concerned for CNS infection however now treating for MRSA PNA - pharmacy consulted for Vancomycin dosing.   Currently on day #8 of vancomycin. WBC WNL, afebrile. Scr remains stable at 0.6 (CrCl ~70 mL/min).   Plan: -Continue vancomycin 1500 mg IV every 12 hours (est AUC 467) until 12/14 - Will continue to follow renal function, culture results  Height: 5\' 7"  (170.2 cm) Weight: 153 lb 3.5 oz (69.5 kg) IBW/kg (Calculated) : 66.1  Temp (24hrs), Avg:97.8 F (36.6 C), Min:97.6 F (36.4 C), Max:98.1 F (36.7 C)  Recent Labs  Lab 07/18/19 0240 07/19/19 0302 07/20/19 0311 07/20/19 1224 07/20/19 1452 07/21/19 0049 07/22/19 0348 07/23/19 0554  WBC 7.1 9.3  --   --   --  8.8 8.2 6.8  CREATININE 0.79 0.62 0.60*  --   --  0.55* 0.70 0.63  VANCOTROUGH  --   --   --  5*  --   --   --   --   VANCOPEAK  --   --   --   --  16*  --   --   --     Estimated Creatinine Clearance: 70 mL/min (by C-G formula based on SCr of 0.63 mg/dL).    No Known Allergies  12/1 ctx >>12/8 12/4 vanc>> [12/14] 12/4 amp>>12/7 12/4 acyc>>12/7 12/1 flagyl>>12/8  12/8: VP 16, VT 5 - calc AUC 234 on 750 mg/12h >> adj to 1500 mg/12h  12/1 covid neg 12/1 BCx: negative 12/4 HSV: sent 12/4 LP:  12/6 BCx: sent 12/6 TA: few MRSA 12/6 UCx: negative 12/7 MRSA PCR positive  Thank you for allowing pharmacy to be a part of this patient's care.  Antonietta Jewel, PharmD, BCCCP Clinical Pharmacist  Phone: [phone]  Please check AMION for all [place] phone numbers After 10:00 PM, call [place] [phone] 07/23/2019 2:17 PM

## 2019-07-23 NOTE — Progress Notes (Signed)
PROGRESS NOTE  Riley Eaton [id number] DOB: 12/17/1940 DOA: 07/13/2019 PCP: Aletha Halim., PA-C   LOS: 10 days   Brief narrative/HPI:  Riley Eaton is a 78 y.o. male with PMH including but not limited to thoracic aortic aneurysm, CAD, chronic back pain, chronic opioid use  presented to [facility] ED 12/1 with altered mental status and abd pain.  He had been seen in [facility] ED 1 day prior for abd pain.    Patient had CT of the abd at the time that demonstrated cholelithiasis without cholecystitis.  Surgery was consulted and recommended outpatient follow up.  He was subsequently discharged home.  On 12/1 patient had worsening of pain along with AMS; therefore, came to [facility] ED.  He was quite agitated per reports and received 1mg  dilaudid, 5mg  haldol, 1mg  ativan x 3 and had minimal response.   PCCM was asked to see in consultation for consideration of precedex.  Per chart review, he has no underlying hx of EtOH abuse or substance abuse.  He does have hx of chronic low back pain and chronic opioid use.  On review of outpatient med list, he had 5-325 norco prescribed for q6hrs PRN, 40mg  oxycontin TID, lyrica 200mg  TID, duloxetine 60mg  BID.  Per chart review, he had admission 09/24/18 through 10/06/18 for acute ascending cholangitis s/p ERCP and sphincterotomy with stenting of pancreatic duct (Dr. Therisa Doyne).  During that admission, he did have severe delirium felt to be due to narcotic withdrawal.  He did spend a few days in ICU and was briefly treated with precedex and fentanyl.  He was discharged and supposed to be evaluated by surgery as an outpatient for cholecystectomy; however, this was postponed due to the [place] pandemic.  Per discharge summary, his medication regimen was adjusted prior to d/c and pt had been tolerating it well (oxycodone weaned from 80mg  BID to BID, lyrica reduced from 200mg  TID to 100mg  TID, cymbalta from 60mg  BID to 60mg  daily).   Assessment/Plan:  Active Problems:   Chronic  low back pain   Choledocholithiasis   Acute metabolic encephalopathy   Mental status alteration   ACS (acute coronary syndrome) (HCC)   Endotracheal tube present   Alteration in nutrition   Encephalopathy   Abdominal pain   Non-ST elevation (NSTEMI) myocardial infarction (HCC)   Hypokalemia   DCM (dilated cardiomyopathy) (HCC)  Acute metabolic encephalopathy: improving but with intermittent episodes of confusion. CT scan of the head and MRI of the head was negative.  Lactate, procalcitonin TSH and ammonia within normal limits.  Less likely opioid withdrawal. LP deferred due to clinical improvement.  I spoke with the family and confirmed that patient was not on fentanyl patch at home and was only on OxyContin.  Blood cultures negative in 4 days. On iv morphine here for pain to avoid withdrawal.   Acute resp failure:  Resolved. Initially intubated for airway protection; status post extubation at this time.  On room air currently.  MRSA in sputum, will continue vancomycin for total of 10 days. No leukocytosis.  Afebrile and stable  Abd pain - no pain today. unclear etiology; though initially concerning for cholecystitis.  Had ERCP with sphincterotomy and pancreatic duct stenting on 09/25/18. CT x 2 shows only cholelithiasis but no active disease. -Off rocephin and flagyl.  HIDA scan showed patent cystic duct without acute cholecystitis.  No abdominal pain today.  LFTs within normal limits except for albumin  Prolonged qt:  - Avoid prolonging agents, - Recheck prn, monitor  electrolytes.  Elevated trop/NSTEMI: - Trop to >2K, now trending down.  No chest pain. -Cardiology on board.  On aspirin.  Cardiology waiting for clinical improvement prior to cardiac catheterization.  HFrEF:  - Newly found on echo EF, normal in 2017. Cardiology on board.  Will need ischemic work-up  Essential HTN:   -On Coreg, spironolactone and losartan at this time.  Continue clonidine patch.  Cardiology on board.  Blood pressure has improved.   hypokalemia.  Potassium of 2.8 despite replacement.  Will give IV potassium x4 again today. Repeat again in PM. Check BMP in a.m..  Magnesium  at 1.8 and phosphorus 2.5.   Dysphagia, poor oral nutrition..  Seen by speech therapy.  Recommend dysphagia 1 diet.  Dietary recommended tube feeding if inadequate oral intake.  MRI of the brain was negative for any stroke.  Seen by neurology for concerns of a stroke  Deconditioning, debility.  Seen by physical therapy.  Inpatient rehabilitation was consulted but now considering SNF  VTE Prophylaxis: Lovenox   Code Status: Full code  Family Communication: none today.  Disposition Plan:  SNF as per rehab team   Awaiting for ischemic work-up by cardiology.  Consult transition of care.   Consultants:  [facility]  Cardiology  Neurology  Procedures:  Intubation and mechanical ventilation.  Extubation  Antibiotics: Ampicillin 12/4 >12/6 Acyclovir 12/4 >12/6 Ceftriaxone 12/1 > 12/8 Flagyl 12/1 > 12/8 Vancomycin 12/4 >12/14  Anti-infectives (From admission, onward)   Start     Dose/Rate Route Frequency Ordered Stop   07/21/19 0100  vancomycin (VANCOCIN) 1,500 mg in sodium chloride 0.9 % 500 mL IVPB     1,500 mg 250 mL/hr over 120 Minutes Intravenous Every 12 hours 07/20/19 1608 07/26/19 2359   07/20/19 1018  cefTRIAXone (ROCEPHIN) 2 g in sodium chloride 0.9 % 100 mL IVPB  Status:  Discontinued     2 g 200 mL/hr over 30 Minutes Intravenous Every 24 hours 07/19/19 1115 07/20/19 0800   07/20/19 1018  cefTRIAXone (ROCEPHIN) 2 g in sodium chloride 0.9 % 100 mL IVPB  Status:  Discontinued     2 g 200 mL/hr over 30 Minutes Intravenous Every 24 hours 07/20/19 0800 07/20/19 0921   07/17/19 0100  vancomycin (VANCOCIN) IVPB 750 mg/150 ml premix  Status:  Discontinued     750 mg 150 mL/hr over 60 Minutes Intravenous Every 12 hours 07/16/19 1240 07/20/19 1608   07/16/19 1400  acyclovir (ZOVIRAX) 700 mg in dextrose  5 % 100 mL IVPB  Status:  Discontinued     10 mg/kg  69.9 kg 114 mL/hr over 60 Minutes Intravenous Every 8 hours 07/16/19 1240 07/19/19 1115   07/16/19 1245  cefTRIAXone (ROCEPHIN) 2 g in sodium chloride 0.9 % 100 mL IVPB  Status:  Discontinued     2 g 200 mL/hr over 30 Minutes Intravenous Every 12 hours 07/16/19 1240 07/19/19 1115   07/16/19 1245  vancomycin (VANCOCIN) 1,500 mg in sodium chloride 0.9 % 500 mL IVPB     1,500 mg 250 mL/hr over 120 Minutes Intravenous  Once 07/16/19 1240 07/16/19 1609   07/16/19 1245  ampicillin (OMNIPEN) 2 g in sodium chloride 0.9 % 100 mL IVPB  Status:  Discontinued     2 g 300 mL/hr over 20 Minutes Intravenous Every 4 hours 07/16/19 1240 07/19/19 1115   07/13/19 1900  cefTRIAXone (ROCEPHIN) 1 g in sodium chloride 0.9 % 100 mL IVPB  Status:  Discontinued     1 g  200 mL/hr over 30 Minutes Intravenous Every 24 hours 07/13/19 1825 07/16/19 1240   07/13/19 1900  metroNIDAZOLE (FLAGYL) IVPB 500 mg  Status:  Discontinued     500 mg 100 mL/hr over 60 Minutes Intravenous Every 8 hours 07/13/19 1825 07/20/19 0921     Subjective: Today, no interval complaints reported overnight.  Patient was taking bedside shower.  Denies chest pain, palpitation, fever chills or rigor.  Objective: Vitals:   07/23/19 1026 07/23/19 1046  BP: 123/88 (!) 121/94  Pulse: 96 97  Resp:  (!) 25  Temp:  98.1 F (36.7 C)  SpO2:  100%    Intake/Output Summary (Last 24 hours) at 07/23/2019 1202 Last data filed at 07/23/2019 0700 Gross per 24 hour  Intake 240.02 ml  Output 300 ml  Net -59.98 ml   Filed Weights   07/21/19 0500 07/22/19 0315 07/23/19 0300  Weight: 65 kg 67.1 kg 69.5 kg   Body mass index is 24 kg/m.   Physical Exam: General: Alert awake communicative.  Chronically ill and deconditioned.  On room air. HENT: Normocephalic, pupils equally reacting to light and accommodation.  No scleral pallor or icterus noted. Oral mucosa is moist.  Chest: Decreased breath  sounds bilaterally, no gross or wheezes noted. CVS: S1 &S2 heard. No murmur.  Regular rate and rhythm. Abdomen: Soft, nontender, nondistended.  Bowel sounds are heard.  Liver is not palpable, no abdominal mass palpated.  Off rectal tube today. Extremities: No cyanosis, clubbing or edema.  Peripheral pulses are palpable. Psych: Alert, awake and communicative, normal mood CNS:  No cranial nerve deficits.  Power equal in all extremities.  No sensory deficits noted.  No cerebellar signs.   Skin: Warm and dry.  No rashes noted.   Data Review: I have personally reviewed the following laboratory data and studies,  CBC: Recent Labs  Lab 07/17/19 1632 07/18/19 0240 07/19/19 0302 07/21/19 0049 07/22/19 0348 07/23/19 0554  WBC 6.2 7.1 9.3 8.8 8.2 6.8  NEUTROABS 4.9 5.5 7.3  --   --   --   HGB 11.9* 12.5* 12.6* 13.5 13.6 12.8*  HCT 33.6* 36.0* 34.7* 37.0* 38.2* 35.6*  MCV 96.8 97.6 94.0 93.7 96.0 93.7  PLT 113* 122* 117* 188 227 [id number]   Basic Metabolic Panel: Recent Labs  Lab 07/18/19 0240 07/19/19 0302 07/20/19 0311 07/21/19 0049 07/22/19 0348 07/23/19 0554  NA 148* 144  --  139 144 140  K 3.3* 3.2*  --  3.3* 2.9* 2.8*  CL 116* 109  --  105 109 107  CO2 22 25  --  23 26 21*  GLUCOSE 120* 148*  --  127* 137* 130*  BUN 29* 17  --  17 20 13   CREATININE 0.79 0.62 0.60* 0.55* 0.70 0.63  CALCIUM 8.3* 8.4*  --  8.4* 8.6* 8.5*  MG  --   --   --  1.8 1.9 1.8  PHOS  --   --   --  3.1 2.5 2.5   Liver Function Tests: Recent Labs  Lab 07/18/19 0240 07/22/19 0348 07/23/19 0554  AST 29 22 24   ALT 30 23 21   ALKPHOS 65 58 56  BILITOT 0.9 1.0 1.3*  PROT 5.9* 5.5* 5.5*  ALBUMIN 3.1* 3.2* 3.1*   No results for input(s): LIPASE, AMYLASE in the last 168 hours. Recent Labs  Lab 07/18/19 1515  AMMONIA 40*   Cardiac Enzymes: No results for input(s): CKTOTAL, CKMB, CKMBINDEX, TROPONINI in the last 168 hours. BNP (last 3 results)  No results for input(s): BNP in the last 8760 hours.   ProBNP (last 3 results) No results for input(s): PROBNP in the last 8760 hours.  CBG: Recent Labs  Lab 07/22/19 1621 07/22/19 2012 07/22/19 2354 07/23/19 0314 07/23/19 1043  GLUCAP 100* 153* 93 115* 116*   Recent Results (from the past 240 hour(s))  Blood cultures x2     Status: None   Collection Time: 07/13/19  6:44 PM   Specimen: BLOOD  Result Value Ref Range Status   Specimen Description BLOOD RIGHT ANTECUBITAL  Final   Special Requests   Final    BOTTLES DRAWN AEROBIC AND ANAEROBIC Blood Culture adequate volume   Culture   Final    NO GROWTH 5 DAYS Performed at [facility], [place] [address], [place], [place] [address]    Report Status 07/18/2019 FINAL  Final  SARS Coronavirus 2 by RT PCR (hospital order, performed in [facility] hospital lab) Nasopharyngeal Nasopharyngeal Swab     Status: None   Collection Time: 07/13/19  9:52 PM   Specimen: Nasopharyngeal Swab  Result Value Ref Range Status   SARS Coronavirus 2 NEGATIVE NEGATIVE Final    Comment: (NOTE) SARS-CoV-2 target nucleic acids are NOT DETECTED. The SARS-CoV-2 RNA is generally detectable in upper and lower respiratory specimens during the acute phase of infection. The lowest concentration of SARS-CoV-2 viral copies this assay can detect is 250 copies / mL. A negative result does not preclude SARS-CoV-2 infection and should not be used as the sole basis for treatment or other patient management decisions.  A negative result may occur with improper specimen collection / handling, submission of specimen other than nasopharyngeal swab, presence of viral mutation(s) within the areas targeted by this assay, and inadequate number of viral copies (<250 copies / mL). A negative result must be combined with clinical observations, patient history, and epidemiological information. Fact Sheet for Patients:   StrictlyIdeas.no Fact Sheet for Healthcare Providers:  BankingDealers.co.za This test is not yet approved or cleared  by the [place] FDA and has been authorized for detection and/or diagnosis of SARS-CoV-2 by FDA under an Emergency Use Authorization (EUA).  This EUA will remain in effect (meaning this test can be used) for the duration of the COVID-19 declaration under Section 564(b)(1) of the Act, 21 U.S.C. section 360bbb-3(b)(1), unless the authorization is terminated or revoked sooner. Performed at [facility], [place] [address], [place], [place] [id number]   Culture, Urine     Status: None   Collection Time: 07/18/19  8:13 AM   Specimen: Urine, Catheterized  Result Value Ref Range Status   Specimen Description URINE, CATHETERIZED  Final   Special Requests NONE  Final   Culture   Final    NO GROWTH Performed at [facility], [address] [place], [place] [address]    Report Status 07/19/2019 FINAL  Final  Culture, respiratory (non-expectorated)     Status: None   Collection Time: 07/18/19  8:13 AM   Specimen: Tracheal Aspirate; Respiratory  Result Value Ref Range Status   Specimen Description TRACHEAL ASPIRATE  Final   Special Requests NONE  Final   Gram Stain   Final    FEW WBC PRESENT, PREDOMINANTLY PMN RARE GRAM POSITIVE COCCI IN CLUSTERS Performed at [facility], [place] [address] [place], [place] [address]    Culture   Final    FEW STAPHYLOCOCCUS AUREUS RARE STENOTROPHOMONAS MALTOPHILIA    Report Status 07/21/2019 FINAL  Final   Organism ID, Bacteria STAPHYLOCOCCUS AUREUS  Final   Organism ID, Bacteria STENOTROPHOMONAS MALTOPHILIA  Final      Susceptibility   Staphylococcus aureus - MIC*    CIPROFLOXACIN <=0.5 SENSITIVE Sensitive     ERYTHROMYCIN RESISTANT Resistant     GENTAMICIN <=0.5 SENSITIVE Sensitive     OXACILLIN >=4 RESISTANT Resistant     TETRACYCLINE <=1 SENSITIVE Sensitive     VANCOMYCIN 1 SENSITIVE Sensitive     TRIMETH/SULFA <=10 SENSITIVE Sensitive      CLINDAMYCIN RESISTANT Resistant     RIFAMPIN <=0.5 SENSITIVE Sensitive     Inducible Clindamycin POSITIVE Resistant     * FEW STAPHYLOCOCCUS AUREUS   Stenotrophomonas maltophilia - MIC*    LEVOFLOXACIN 4 INTERMEDIATE Intermediate     TRIMETH/SULFA <=20 SENSITIVE Sensitive     * RARE STENOTROPHOMONAS MALTOPHILIA  Culture, blood (routine x 2)     Status: None   Collection Time: 07/18/19  9:24 AM   Specimen: BLOOD  Result Value Ref Range Status   Specimen Description BLOOD LEFT THUMB  Final   Special Requests   Final    BOTTLES DRAWN AEROBIC AND ANAEROBIC Blood Culture adequate volume   Culture   Final    NO GROWTH 5 DAYS Performed at [facility], [address], [place], [place] [address]    Report Status 07/23/2019 FINAL  Final  Culture, blood (routine x 2)     Status: None   Collection Time: 07/18/19  9:30 AM   Specimen: BLOOD RIGHT HAND  Result Value Ref Range Status   Specimen Description BLOOD RIGHT HAND  Final   Special Requests   Final    BOTTLES DRAWN AEROBIC AND ANAEROBIC Blood Culture adequate volume   Culture   Final    NO GROWTH 5 DAYS Performed at [facility], [place] [address], [place], [place] [address]    Report Status 07/23/2019 FINAL  Final  MRSA PCR Screening     Status: Abnormal   Collection Time: 07/19/19  9:01 AM   Specimen: Nasal Mucosa; Nasopharyngeal  Result Value Ref Range Status   MRSA by PCR POSITIVE (A) NEGATIVE Final    Comment:        The GeneXpert MRSA Assay (FDA approved for NASAL specimens only), is one component of a comprehensive MRSA colonization surveillance program. It is not intended to diagnose MRSA infection nor to guide or monitor treatment for MRSA infections. RESULT CALLED TO, READ BACK BY AND VERIFIED WITH: Lattie Haw RN 11:10 07/19/19 ([email or web address]) Performed at [facility], [place] [address], [place], [place] [address]      Studies: No results found.  Scheduled Meds: . aspirin  81 mg Oral Daily  . atorvastatin   40 mg Oral q1800  . carvedilol  6.25 mg Oral BID WC  . Chlorhexidine Gluconate Cloth  6 each Topical Daily  . cloNIDine  0.1 mg Transdermal Weekly  . enoxaparin (LOVENOX) injection  40 mg Subcutaneous Q24H  . losartan  50 mg Oral Daily  . mouth rinse  15 mL Mouth Rinse BID  . mupirocin ointment  1 application Nasal BID  . pantoprazole (PROTONIX) IV  40 mg Intravenous Q24H  . spironolactone  12.5 mg Oral Daily    Continuous Infusions: . potassium chloride 10 mEq (07/23/19 1133)  . vancomycin 1,500 mg (07/23/19 0201)     Flora Lipps, MD  [facility] 07/23/2019

## 2019-07-23 NOTE — Progress Notes (Signed)
Occupational Therapy Treatment Patient Details Name: Riley Eaton MRN: [id number] DOB: [birth year] Today's Date: 07/23/2019    History of present illness Riley Eaton is a 78 y.o. male who presented to ED 12/1 with abd pain and AMS.  Had been seen 1 day earlier and CT abd / pelv showed cholelithiasis without cholecystitis. Intubated 12/4-12/7. PHMx: Thoracic aortic aneurysm; Chronic low back pain; Choledocholithiasis; Chronic pain; Acute metabolic encephalopathy; ACS. 12/3 CT head: Significant motion limitations. No evidence of acute abnormality   OT comments  Pt making minimal progress towards OT goals this session. Pt found to have both legs hanging out of bed, perseverating on getting up to w/c upon arrival.Overall, pt required MOD- MAX A for all functional mobility with RW. Pt limited by cognitive deficits unable to attend to ADL tasks. Pt complete functional mobility from EOB to door with RW and MOD- MAX A as pt very impulsive trying to pull a chair and walk into bathroom even though attempted to orient pt to condom cath. Assisted pt back to supine with MAX A +2 as pt too confused to be left up in chair without sitter. DC plan currently remains appropriate, will continue to follow acutely per POC.    Follow Up Recommendations  CIR;Supervision/Assistance - 24 hour    Equipment Recommendations  Other (comment)(TBD at next venue of care)    Recommendations for Other Services      Precautions / Restrictions Precautions Precautions: Fall Precaution Comments: right inattention Restrictions Weight Bearing Restrictions: No       Mobility Bed Mobility Overal bed mobility: Needs Assistance Bed Mobility: Supine to Sit;Sit to Supine     Supine to sit: Min assist;+2 for safety/equipment;HOB elevated Sit to supine: Max assist;+2 for physical assistance   General bed mobility comments: pt initiated all movement towards EOB, needing light MINA +2 for safety and to maintain initial  sitting balance. MAX A +2 for sit>supine as pt declining wanting to get back in bed d/t confusion  Transfers Overall transfer level: Needs assistance Equipment used: Rolling walker (2 wheeled) Transfers: Sit to/from Stand Sit to Stand: Mod assist;+2 physical assistance         General transfer comment: pt required MOD A +2 to power into standing needing verbal and tactile cues for hand placement and technique. Initial MOD A to steady once standing. unable to leave up in recliner d/t confusion    Balance Overall balance assessment: Needs assistance Sitting-balance support: Feet supported;Single extremity supported Sitting balance-Leahy Scale: Poor Sitting balance - Comments: Sat EOB with min guard with slight rt lateral lean Postural control: Right lateral lean Standing balance support: Bilateral upper extremity supported Standing balance-Leahy Scale: Poor Standing balance comment: reliant on BUE support and external support                           ADL either performed or assessed with clinical judgement   ADL Overall ADL's : Needs assistance/impaired                         Toilet Transfer: Moderate assistance;Maximal assistance;+2 for physical assistance;Ambulation;RW Toilet Transfer Details (indicate cue type and reason): simulated via functional mobility with MOD- MAX A +2. pt very impulsive throughout reaching to pull up a chair and very unsteady on feet         Functional mobility during ADLs: Moderate assistance;Maximal assistance;+2 for safety/equipment;Cueing for safety;Cueing for sequencing;Rolling walker General ADL Comments:  pt limited by cognitive deficits this session, very impulsive needing MOD- MAX A for all functional mobility. unable to attend to any ADL tasks, perseverating on on wanting to get in w/c     Vision Baseline Vision/History: No visual deficits     Perception     Praxis      Cognition Arousal/Alertness:  Awake/alert Behavior During Therapy: Restless Overall Cognitive Status: Impaired/Different from baseline Area of Impairment: Orientation;Following commands;Safety/judgement;Problem solving                 Orientation Level: Disoriented to;Time;Place;Situation     Following Commands: Follows one step commands inconsistently;Follows one step commands with increased time Safety/Judgement: Decreased awareness of safety;Decreased awareness of deficits   Problem Solving: Slow processing;Decreased initiation;Difficulty sequencing;Requires verbal cues;Requires tactile cues General Comments: pt disoriented to location and situation, only able to recall that he was in [place]. perseverating on wanting to get in w/c. pt with legs hanging over rails upon entry. Following one step commands with increased time and requires tactile cues        Exercises     Shoulder Instructions       General Comments vss    Pertinent Vitals/ Pain       Pain Assessment: No/denies pain  Home Living                                          Prior Functioning/Environment              Frequency  Min 2X/week        Progress Toward Goals  OT Goals(current goals can now be found in the care plan section)  Progress towards OT goals: Progressing toward goals  Acute Rehab OT Goals Patient Stated Goal: to be able to go home OT Goal Formulation: With patient Time For Goal Achievement: 08/03/19 Potential to Achieve Goals: Good  Plan Discharge plan remains appropriate    Co-evaluation                 AM-PAC OT "6 Clicks" Daily Activity     Outcome Measure   Help from another person eating meals?: Total Help from another person taking care of personal grooming?: A Lot Help from another person toileting, which includes using toliet, bedpan, or urinal?: Total Help from another person bathing (including washing, rinsing, drying)?: A Lot Help from another person to put on and  taking off regular upper body clothing?: Total Help from another person to put on and taking off regular lower body clothing?: Total 6 Click Score: 8    End of Session Equipment Utilized During Treatment: Gait belt;Rolling walker  OT Visit Diagnosis: Unsteadiness on feet (R26.81);Other abnormalities of gait and mobility (R26.89);Muscle weakness (generalized) (M62.81);Low vision, both eyes (H54.2);Other symptoms and signs involving cognitive function;Hemiplegia and hemiparesis Hemiplegia - Right/Left: Right Hemiplegia - dominant/non-dominant: Dominant Hemiplegia - caused by: Unspecified   Activity Tolerance Other (comment)(limited by cognitive deficits)   Patient Left in bed;with call bell/phone within reach;with bed alarm set;with restraints reapplied   Nurse Communication Mobility status        Time: 1510-1529 OT Time Calculation (min): 19 min  Charges: OT General Charges $OT Visit: 1 Visit OT Treatments $Therapeutic Activity: 8-22 mins  Lanier Clam., COTA/L Acute Rehabilitation Services [phone] [place] 07/23/2019, 4:09 PM

## 2019-07-23 NOTE — Progress Notes (Signed)
  Speech Language Pathology Treatment: Dysphagia  Patient Details Name: Riley Eaton MRN: [id number] DOB: 12-11-1940 Today's Date: 07/23/2019 Time: [id number] SLP Time Calculation (min) (ACUTE ONLY): 15 min  Assessment / Plan / Recommendation Clinical Impression  Patient had just finished breakfast when entering room.He was fully alert and trying to convince nursing as well as myself to help him get up using a walker. He was easily directed, however returned to the topic of getting out of bed multiple times during the session.  Nursing reported pt tolerated breakfast well. Pt's voice is judged to be clear. Observed honey thickened liquid by tsp and applesauce trials with no s/s of aspiration, clear vocal quality between sips/bites. Speech therapy plans to repeat MBS next week.   HPI HPI: Riley Eaton is a 78 y.o. male who presented to ED 12/1 with abd pain and AMS.  Had been seen 1 day earlier and CT abd / pelv showed cholelithiasis without cholecystitis. He does have hx of chronic low back pain and chronic opioid use. Intubated 12/4-12/7       SLP Plan  Continue with current plan of care;MBS(Repeat MBS next week )       Recommendations  Diet recommendations: Dysphagia 1 (puree);Honey-thick liquid(tsp size sips) Liquids provided via: Teaspoon Medication Administration: Whole meds with puree Supervision: Staff to assist with self feeding Compensations: Slow rate;Small sips/bites Postural Changes and/or Swallow Maneuvers: Seated upright 90 degrees                Oral Care Recommendations: Oral care BID Follow up Recommendations: Skilled Nursing facility SLP Visit Diagnosis: Dysphagia, oropharyngeal phase (R13.12) Plan: Continue with current plan of care;MBS(Repeat MBS next week )       GO                Wynelle Bourgeois., MA, CCC-SLP 07/23/2019, 11:16 AM

## 2019-07-23 NOTE — Progress Notes (Signed)
Nutrition Follow-up  DOCUMENTATION CODES:   Not applicable  INTERVENTION:   Magic cup TID with meals, each supplement provides 290 kcal and 9 grams of protein  Feeding assistance and encouragement at meal times  Recommend supplementing potassium  NUTRITION DIAGNOSIS:   Increased nutrient needs related to acute illness as evidenced by estimated needs.  Being addressed via supplements  GOAL:   Patient will meet greater than or equal to 90% of their needs  Progressing   MONITOR:   PO intake, Supplement acceptance, Labs, Weight trends  REASON FOR ASSESSMENT:   Ventilator    ASSESSMENT:   Patient with PMH significant for chronic low back pain, chronic opoid abuse, PCM, CAD, and thoracic aortic aneurysm. Presents this admission with AMS from unclear etiology and chololithiasis.  RD working remotely.  12/01 Admit 12/04 Rapid Response, intubated 12/06 CT abdomen with moderate size hiatal hernia containing a portion of the stomach. Enteric tube with tip in the distal esophagus close to the GE junction within the hiatal hernia; trickle TF started 12/07 Extubated 12/09 Diet advanced to Dysphagia 1, Honey thick  Tolerating Dysphagia I, Honey Thick diet. Recorded po intake 80-100% of meals.  Noted SLP plans to repeat MBS next week  Labs: potassium 2.8 (L), magnesium wdl, phosphorus wdl, Creatinine wdl Meds: reviewed   Diet Order:   Diet Order            DIET - DYS 1 Room service appropriate? Yes; Fluid consistency: Honey Thick  Diet effective now              EDUCATION NEEDS:   Not appropriate for education at this time  Skin:  Skin Assessment: Reviewed RN Assessment  Last BM:  12/9  Height:   Ht Readings from Last 1 Encounters:  07/16/19 5\' 7"  (1.702 m)    Weight:   Wt Readings from Last 1 Encounters:  07/23/19 69.5 kg    Ideal Body Weight:  67.3 kg  BMI:  Body mass index is 24 kg/m.  Estimated Nutritional Needs:   Kcal:  1950-2150  kcals  Protein:  98-108 g  Fluid:  >/= 1.7 L    Cate Nalah Macioce MS, RDN, LDN, CNSC [phone] Pager  [phone] Weekend/On-Call Pager

## 2019-07-23 NOTE — Progress Notes (Signed)
Progress Note  Patient Name: Riley Eaton Date of Encounter: 07/23/2019  Primary Cardiologist: Quay Burow, MD   Subjective   Very weak. Denies any CP or SOB.  Very confused with legs hanging off the railing.   Inpatient Medications    Scheduled Meds: . aspirin  81 mg Oral Daily  . atorvastatin  40 mg Oral q1800  . carvedilol  6.25 mg Oral BID WC  . Chlorhexidine Gluconate Cloth  6 each Topical Daily  . cloNIDine  0.1 mg Transdermal Weekly  . enoxaparin (LOVENOX) injection  40 mg Subcutaneous Q24H  . losartan  25 mg Oral Daily  . mouth rinse  15 mL Mouth Rinse BID  . mupirocin ointment  1 application Nasal BID  . pantoprazole (PROTONIX) IV  40 mg Intravenous Q24H  . spironolactone  12.5 mg Oral Daily   Continuous Infusions: . vancomycin 1,500 mg (07/23/19 0201)   PRN Meds: acetaminophen, docusate, haloperidol lactate, hydrALAZINE, LORazepam, morphine injection, naLOXone (NARCAN)  injection, ondansetron (ZOFRAN) IV, Resource ThickenUp Clear   Vital Signs    Vitals:   07/23/19 0300 07/23/19 0303 07/23/19 0312 07/23/19 0640  BP:  (!) 183/99 (!) 158/108 (!) 153/98  Pulse:  70 81 92  Resp:  20 (!) 23 19  Temp:  97.6 F (36.4 C)    TempSrc:  Oral    SpO2:  100% 99% 100%  Weight: 69.5 kg     Height:        Intake/Output Summary (Last 24 hours) at 07/23/2019 0717 Last data filed at 07/23/2019 0700 Gross per 24 hour  Intake 680.12 ml  Output 650 ml  Net 30.12 ml   Last 3 Weights 07/23/2019 07/22/2019 07/21/2019  Weight (lbs) 153 lb 3.5 oz 147 lb 14.9 oz 143 lb 4.8 oz  Weight (kg) 69.5 kg 67.1 kg 65 kg      Telemetry    NSR - Personally Reviewed  ECG    No new EKG to review - Personally Reviewed  Physical Exam   GEN: thin and ill appearing HEENT: Normal NECK: No JVD; No carotid bruits LYMPHATICS: No lymphadenopathy CARDIAC:RRR, no murmurs, rubs, gallops RESPIRATORY:  Clear to auscultation without rales, wheezing or rhonchi  ABDOMEN: Soft,  non-tender, non-distended MUSCULOSKELETAL:  No edema; No deformity  SKIN: Warm and dry NEUROLOGIC:  Cannot assess PSYCHIATRIC:  Cannot assess   Labs    High Sensitivity Troponin:   Recent Labs  Lab 07/14/19 1409 07/15/19 0841 07/15/19 1220 07/16/19 1251 07/18/19 0240  TROPONINIHS 974* 2,079* 2,110* 1,422* 289*      Chemistry Recent Labs  Lab 07/18/19 0240 07/19/19 0302 07/20/19 0311 07/21/19 0049 07/22/19 0348  NA 148* 144  --  139 144  K 3.3* 3.2*  --  3.3* 2.9*  CL 116* 109  --  105 109  CO2 22 25  --  23 26  GLUCOSE 120* 148*  --  127* 137*  BUN 29* 17  --  17 20  CREATININE 0.79 0.62 0.60* 0.55* 0.70  CALCIUM 8.3* 8.4*  --  8.4* 8.6*  PROT 5.9*  --   --   --  5.5*  ALBUMIN 3.1*  --   --   --  3.2*  AST 29  --   --   --  22  ALT 30  --   --   --  23  ALKPHOS 65  --   --   --  58  BILITOT 0.9  --   --   --  1.0  GFRNONAA >60 >60 >60 >60 >60  GFRAA >60 >60 >60 >60 >60  ANIONGAP 10 10  --  11 9     Hematology Recent Labs  Lab 07/21/19 0049 07/22/19 0348 07/23/19 0554  WBC 8.8 8.2 6.8  RBC 3.95* 3.98* 3.80*  HGB 13.5 13.6 12.8*  HCT 37.0* 38.2* 35.6*  MCV 93.7 96.0 93.7  MCH 34.2* 34.2* 33.7  MCHC 36.5* 35.6 36.0  RDW 13.3 13.7 13.5  PLT 188 227 216    BNPNo results for input(s): BNP, PROBNP in the last 168 hours.   DDimer No results for input(s): DDIMER in the last 168 hours.   Radiology    DG Swallowing Func-Speech Pathology  Result Date: 07/21/2019 Objective Swallowing Evaluation: Type of Study: MBS-Modified Barium Swallow Study  Patient Details Name: BRACYN AMICONE MRN: [id number] Date of Birth: [birth year] Today's Date: 07/21/2019 Time: SLP Start Time (ACUTE ONLY): 0910 -SLP Stop Time (ACUTE ONLY): 0950 SLP Time Calculation (min) (ACUTE ONLY): 40 min Past Medical History: Past Medical History: Diagnosis Date . Chronic back pain  . Chronic low back pain 05/19/2017 . Chronic, continuous use of opioids   for back pain . Coronary artery  calcification seen on CT scan  . Nerve pain  . Thoracic aortic aneurysm [facility])  Past Surgical History: Past Surgical History: Procedure Laterality Date . BACK SURGERY    09-2016 . back surgey   . ENDOSCOPIC RETROGRADE CHOLANGIOPANCREATOGRAPHY (ERCP) WITH PROPOFOL N/A 09/25/2018  Procedure: ENDOSCOPIC RETROGRADE CHOLANGIOPANCREATOGRAPHY (ERCP) WITH PROPOFOL;  Surgeon: Ronnette Juniper, MD;  Location: [place];  Service: Gastroenterology;  Laterality: N/A; . PANCREATIC STENT PLACEMENT  09/25/2018  Procedure: PANCREATIC STENT PLACEMENT;  Surgeon: Ronnette Juniper, MD;  Location: [facility] ENDOSCOPY;  Service: Gastroenterology;; . REMOVAL OF STONES  09/25/2018  Procedure: REMOVAL OF STONES;  Surgeon: Ronnette Juniper, MD;  Location: [facility] ENDOSCOPY;  Service: Gastroenterology;; . [name]  09/25/2018  Procedure: SPHINCTEROTOMY;  Surgeon: Ronnette Juniper, MD;  Location: [facility] ENDOSCOPY;  Service: Gastroenterology;; HPI: ZAK HYKE is a 78 y.o. male who presented to ED 12/1 with abd pain and AMS.  Had been seen 1 day earlier and CT abd / pelv showed cholelithiasis without cholecystitis. He does have hx of chronic low back pain and chronic opioid use. Intubated 12/4-12/7. 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 Pt experiences silent aspiration with thin, nectar and honey textures when bolus arrives at the pyriform sinuses just prior to swallow initiation, which pushes bolus into the laryngeal vestibule. Attempted controlling bolus size with all textures, only beneficial with honey teaspoon. Did not attempt positional strategies because pt was less attentive today, needed max verbal cues to clear throat or swallow again to clear aspirate, he was also  reporting nausea and trying to refuse boluses. Pt may demosntrate improved performance with further recovery. For now, recommend puree textuers and honey thick liquids.  SLP Visit Diagnosis Dysphagia, oropharyngeal phase (R13.12) Attention and concentration deficit following -- Frontal lobe and executive function deficit following -- Impact on safety and function Moderate aspiration risk   CHL IP TREATMENT RECOMMENDATION 07/21/2019 Treatment Recommendations Defer until completion of intrumental exam   Prognosis 09/29/2018 Prognosis for Safe Diet Advancement Good Barriers to Reach Goals -- Barriers/Prognosis Comment -- CHL IP DIET RECOMMENDATION  07/21/2019 SLP Diet Recommendations Dysphagia 1 (Puree) solids;Honey thick liquids Liquid Administration via Spoon Medication Administration -- Compensations Slow rate;Small sips/bites Postural Changes --   CHL IP OTHER RECOMMENDATIONS 07/21/2019 Recommended Consults -- Oral Care Recommendations Oral care QID Other Recommendations Order thickener from pharmacy;Have oral suction available   CHL IP FOLLOW UP RECOMMENDATIONS 07/21/2019 Follow up Recommendations Skilled Nursing facility   [facility] IP FREQUENCY AND DURATION 07/21/2019 Speech Therapy Frequency (ACUTE ONLY) min 2x/week Treatment Duration 2 weeks      CHL IP ORAL PHASE 07/21/2019 Oral Phase Impaired Oral - Pudding Teaspoon -- Oral - Pudding Cup -- Oral - Honey Teaspoon Right anterior bolus loss;Right pocketing in lateral sulci Oral - Honey Cup Right anterior bolus loss;Right pocketing in lateral sulci Oral - Nectar Teaspoon Right anterior bolus loss;Right pocketing in lateral sulci Oral - Nectar Cup Right anterior bolus loss;Right pocketing in lateral sulci Oral - Nectar Straw Right anterior bolus loss;Right pocketing in lateral sulci Oral - Thin Teaspoon -- Oral - Thin Cup Right anterior bolus loss;Right pocketing in lateral sulci Oral - Thin Straw Right anterior bolus loss;Right pocketing in lateral sulci Oral - Puree Right  anterior bolus loss;Right pocketing in lateral sulci Oral - Mech Soft -- Oral - Regular Right anterior bolus loss;Right pocketing in lateral sulci Oral - Multi-Consistency -- Oral - Pill -- Oral Phase - Comment --  CHL IP PHARYNGEAL PHASE 07/21/2019 Pharyngeal Phase Impaired Pharyngeal- Pudding Teaspoon -- Pharyngeal -- Pharyngeal- Pudding Cup -- Pharyngeal -- Pharyngeal- Honey Teaspoon Delayed swallow initiation-vallecula Pharyngeal Material does not enter airway Pharyngeal- Honey Cup Delayed swallow initiation-pyriform sinuses;Penetration/Aspiration during swallow Pharyngeal Material enters airway, passes BELOW cords without attempt by patient to eject out (silent aspiration) Pharyngeal- Nectar Teaspoon Penetration/Aspiration during swallow Pharyngeal Material enters airway, passes BELOW cords without attempt by patient to eject out (silent aspiration) Pharyngeal- Nectar Cup Penetration/Aspiration during swallow;Delayed swallow initiation-pyriform sinuses Pharyngeal Material enters airway, passes BELOW cords without attempt by patient to eject out (silent aspiration) Pharyngeal- Nectar Straw Penetration/Aspiration during swallow Pharyngeal Material enters airway, passes BELOW cords without attempt by patient to eject out (silent aspiration) Pharyngeal- Thin Teaspoon -- Pharyngeal -- Pharyngeal- Thin Cup Penetration/Aspiration during swallow Pharyngeal Material enters airway, passes BELOW cords without attempt by patient to eject out (silent aspiration) Pharyngeal- Thin Straw Penetration/Aspiration during swallow Pharyngeal Material enters airway, passes BELOW cords without attempt by patient to eject out (silent aspiration) Pharyngeal- Puree Delayed swallow initiation-vallecula Pharyngeal -- Pharyngeal- Mechanical Soft Delayed swallow initiation-vallecula Pharyngeal -- Pharyngeal- Regular -- Pharyngeal -- Pharyngeal- Multi-consistency -- Pharyngeal -- Pharyngeal- Pill -- Pharyngeal -- Pharyngeal Comment --  No  flowsheet data found. DeBlois, Katherene Ponto 07/21/2019, 12:28 PM               Cardiac Studies   Echocardiogram 07/14/2019: 1. Technically difficult study. Left ventricular ejection fraction, by visual estimation, is 30 to 35%. The left ventricle has grossly severely decreased function. Images are not sufficient to assess for regional wall motion abnormalities. There is  mildly increased left ventricular hypertrophy. 2. Left ventricular diastolic parameters are consistent with Grade I diastolic dysfunction (impaired relaxation). 3. Global right ventricle has normal systolic function.The right ventricular size is normal. 4. The mitral valve is normal in structure. No evidence of mitral valve regurgitation. 5. The tricuspid valve is not well visualized. Tricuspid valve regurgitation is not demonstrated. 6. The aortic valve was not well visualized. Aortic valve regurgitation is not visualized. 7. The pulmonic valve was not well visualized. Pulmonic valve regurgitation is  not visualized.  Patient Profile     78 y.o.malewith a hx of chronic pain, opoid use, prior pancreatic stent placement, thoracic aortic aneurysm, coronary artery calcification seen on chest CTandelevated troponin in the setting of AMS and chololithiasiswith decrease in EF from 2017.  Assessment & Plan    NSTEMI - EKG on 07/16/2019 showed deep T wave inversions in inferior and anterolateral leads. New compared to initial EKG on admission. - High-sensitivity troponin elevated peaked at 2,110 on 07/15/2019.  - Echo on 07/14/2019 showed LVEF of 30-35% with mild LVH and grade 1 diastolic dysfunction. Images not sufficient to assess wall motion abnormalities. - Coronary calcification noted on CT scan in 09/2018.  - Patient denies any recent chest pain. - He ultimately needs cardiac catheterization; however, not a great candidate at this time due to unclear source of infection and unclear if he would be able to do DAPT  without interruption. Also needs mental status to improve. 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 - No acute infection on abdominal/pelvic CT on 07/18/2019. - HIDA scan on 07/19/2019 showed patent cystic duct without evidence for acute cholecystitis. Did have delayed filling of gallbladder following morphine administration which can be seen in chronic cholecystitis. - Last fever on 07/18/2019. - MRSA in sputum - continue on Vancomycin - Patient developed right sided weakness and facial droop concerning for stroke but brain MRI negative for acute infarction. - No clear cause of AMS at this time.  Acute Respiratory Failure - Required intubation but has since been extubated. - Management per primary team.  Hypokalemia -K+ 2.9 yesterday -BMET pending this am -replete per [facility]  Otherwise, per primary team.  For questions or updates, please contact [place] Please consult [email or web address] for contact info under        Signed, Fransico Him, MD  07/23/2019, 7:17 AM

## 2019-07-24 LAB — GLUCOSE, CAPILLARY
Glucose-Capillary: 104 mg/dL — ABNORMAL HIGH (ref 70–99)
Glucose-Capillary: 121 mg/dL — ABNORMAL HIGH (ref 70–99)
Glucose-Capillary: 126 mg/dL — ABNORMAL HIGH (ref 70–99)
Glucose-Capillary: 132 mg/dL — ABNORMAL HIGH (ref 70–99)
Glucose-Capillary: 145 mg/dL — ABNORMAL HIGH (ref 70–99)
Glucose-Capillary: 155 mg/dL — ABNORMAL HIGH (ref 70–99)
Glucose-Capillary: 163 mg/dL — ABNORMAL HIGH (ref 70–99)
Glucose-Capillary: 167 mg/dL — ABNORMAL HIGH (ref 70–99)

## 2019-07-24 LAB — COMPREHENSIVE METABOLIC PANEL
ALT: 24 U/L (ref 0–44)
AST: 26 U/L (ref 15–41)
Albumin: 3.5 g/dL (ref 3.5–5.0)
Alkaline Phosphatase: 66 U/L (ref 38–126)
Anion gap: 11 (ref 5–15)
BUN: 15 mg/dL (ref 8–23)
CO2: 23 mmol/L (ref 22–32)
Calcium: 9 mg/dL (ref 8.9–10.3)
Chloride: 108 mmol/L (ref 98–111)
Creatinine, Ser: 0.74 mg/dL (ref 0.61–1.24)
GFR calc Af Amer: >60 mL/min (ref >60)
GFR calc non Af Amer: >60 mL/min (ref >60)
Glucose, Bld: 166 mg/dL — ABNORMAL HIGH (ref 70–99)
Potassium: 3.5 mmol/L (ref 3.5–5.1)
Sodium: 142 mmol/L (ref 135–145)
Total Bilirubin: 1.5 mg/dL — ABNORMAL HIGH (ref 0.3–1.2)
Total Protein: 6.1 g/dL — ABNORMAL LOW (ref 6.5–8.1)

## 2019-07-24 LAB — CBC
HCT: 39.4 % (ref 39.0–52.0)
Hemoglobin: 14.2 g/dL (ref 13.0–17.0)
MCH: 33.9 pg (ref 26.0–34.0)
MCHC: 36 g/dL (ref 30.0–36.0)
MCV: 94 fL (ref 80.0–100.0)
Platelets: 329 10*3/uL (ref 150–400)
RBC: 4.19 MIL/uL — ABNORMAL LOW (ref 4.22–5.81)
RDW: 13.8 % (ref 11.5–15.5)
WBC: 13.6 10*3/uL — ABNORMAL HIGH (ref 4.0–10.5)
nRBC: 0 % (ref 0.0–0.2)

## 2019-07-24 LAB — MAGNESIUM: Magnesium: 2 mg/dL (ref 1.7–2.4)

## 2019-07-24 MED ORDER — PANTOPRAZOLE SODIUM 40 MG PO TBEC
40.0000 mg | DELAYED_RELEASE_TABLET | Freq: Every day | ORAL | Status: DC
  Administered 2019-07-24 – 2019-08-01 (×9): 40 mg via ORAL
  Filled 2019-07-24 (×9): qty 1

## 2019-07-24 MED ORDER — CARVEDILOL 12.5 MG PO TABS
12.5000 mg | ORAL_TABLET | Freq: Two times a day (BID) | ORAL | Status: DC
  Administered 2019-07-24 – 2019-08-02 (×20): 12.5 mg via ORAL
  Filled 2019-07-24 (×20): qty 1

## 2019-07-24 NOTE — Progress Notes (Signed)
CSW acknowledges consult for SNF. OT/PT is recommending CIR at this time. Please re-consult if disposition plan changes. Consult is being closed out. 

## 2019-07-24 NOTE — Progress Notes (Signed)
Progress Note  Patient Name: Riley Eaton Date of Encounter: 07/24/2019  Primary Cardiologist: Quay Burow, MD   Subjective   Remains confused this AM, agitated  Inpatient Medications    Scheduled Meds: . aspirin  81 mg Oral Daily  . atorvastatin  40 mg Oral q1800  . carvedilol  6.25 mg Oral BID WC  . Chlorhexidine Gluconate Cloth  6 each Topical Daily  . cloNIDine  0.1 mg Transdermal Weekly  . enoxaparin (LOVENOX) injection  40 mg Subcutaneous Q24H  . losartan  50 mg Oral Daily  . mouth rinse  15 mL Mouth Rinse BID  . pantoprazole (PROTONIX) IV  40 mg Intravenous Q24H  . spironolactone  12.5 mg Oral Daily   Continuous Infusions: . vancomycin 1,500 mg (07/24/19 0103)   PRN Meds: acetaminophen, docusate, haloperidol lactate, hydrALAZINE, LORazepam, morphine injection, naLOXone (NARCAN)  injection, ondansetron (ZOFRAN) IV, Resource ThickenUp Clear   Vital Signs    Vitals:   07/24/19 0337 07/24/19 0607 07/24/19 0611 07/24/19 0648  BP: (!) 163/109  (!) 148/98   Pulse: (!) 103 (!) 103  (!) 111  Resp: 20 18 (!) 22 17  Temp: 97.6 F (36.4 C)     TempSrc: Axillary     SpO2: 100% 100%  98%  Weight: 73.1 kg     Height:        Intake/Output Summary (Last 24 hours) at 07/24/2019 0815 Last data filed at 07/24/2019 [id number] Gross per 24 hour  Intake 500 ml  Output 500 ml  Net 0 ml   Last 3 Weights 07/24/2019 07/23/2019 07/22/2019  Weight (lbs) 161 lb 1.6 oz 153 lb 3.5 oz 147 lb 14.9 oz  Weight (kg) 73.074 kg 69.5 kg 67.1 kg      Telemetry    Tachycardia, heavy artifact. Likely sinus with PACs - Personally Reviewed  ECG    n/a - Personally Reviewed  Physical Exam   GEN: No acute distress.   Neck: No JVD Cardiac: tachy, no murmurs, rubs, or gallops.  Respiratory: Clear to auscultation bilaterally. GI: Soft, nontender, non-distended  MS: No edema; No deformity. Neuro:  Nonfocal  Psych: Normal affect   Labs    High Sensitivity Troponin:   Recent  Labs  Lab 07/14/19 1409 07/15/19 0841 07/15/19 1220 07/16/19 1251 07/18/19 0240  TROPONINIHS 974* 2,079* 2,110* 1,422* 289*      Chemistry Recent Labs  Lab 07/22/19 0348 07/23/19 0554 07/24/19 0336  NA 144 140 142  K 2.9* 2.8* 3.5  CL 109 107 108  CO2 26 21* 23  GLUCOSE 137* 130* 166*  BUN 20 13 15   CREATININE 0.70 0.63 0.74  CALCIUM 8.6* 8.5* 9.0  PROT 5.5* 5.5* 6.1*  ALBUMIN 3.2* 3.1* 3.5  AST 22 24 26   ALT 23 21 24   ALKPHOS 58 56 66  BILITOT 1.0 1.3* 1.5*  GFRNONAA >60 >60 >60  GFRAA >60 >60 >60  ANIONGAP 9 12 11      Hematology Recent Labs  Lab 07/22/19 0348 07/23/19 0554 07/24/19 0336  WBC 8.2 6.8 13.6*  RBC 3.98* 3.80* 4.19*  HGB 13.6 12.8* 14.2  HCT 38.2* 35.6* 39.4  MCV 96.0 93.7 94.0  MCH 34.2* 33.7 33.9  MCHC 35.6 36.0 36.0  RDW 13.7 13.5 13.8  PLT 227 216 329    BNPNo results for input(s): BNP, PROBNP in the last 168 hours.   DDimer No results for input(s): DDIMER in the last 168 hours.   Radiology    No results found.  Cardiac Studies    Patient Profile     78 y.o.malewith a hx of chronic pain, opoid use, prior pancreatic stent placement, thoracic aortic aneurysm, coronary artery calcification seen on chest CTandelevated troponin in the setting of AMS and chololithiasiswith decrease in EF from 2017.  Assessment & Plan    1. NSTEMI/Acute systolic HF - peak trop Q000111Q, trending down. EKG with deep inferior/anterolateral TWIs - 07/2019 echo LVEF [id number], grade I diastolic dysfunction.  - has not had any specific chest pain - have not pursued cath in setting of acute infection/fevers, encephalopathy. Possibly for cath as he improves this admission, still very confused and agitated   - medical therapy with ASA, atorva 40, coreg 6.25mg  bid, losartan 50, aldactone 12.5.  - elevated bp's, increase coreg to 12.5mg  bid.    2. AMS - imaging of head has been negative - workup per primary team  3. Respiratory failure - initially  intubated on admission for airway protection - has been extubated, maintaining sats on RA - MRSA in sputum, he is on vanc   4. Tachycardia - heavy artifact on tele, difficult to distinguish rhythm. I think SR with PACs, likely elevated from agitation. Obtain 12 lead.    For questions or updates, please contact [place] Please consult [email or web address] for contact info under        Signed, Carlyle Dolly, MD  07/24/2019, 8:15 AM

## 2019-07-24 NOTE — Progress Notes (Signed)
PROGRESS NOTE  Riley Eaton [id number] DOB: 12-07-1940 DOA: 07/13/2019 PCP: Aletha Halim., PA-C   LOS: 11 days   Brief narrative/HPI:  Riley Eaton is a 78 y.o. male with PMH including but not limited to thoracic aortic aneurysm, CAD, chronic back pain, chronic opioid use  presented to [facility] ED 12/1 with altered mental status and abd pain.  He had been seen in [facility] ED 1 day prior for abd pain.    Patient had CT of the abd at the time that demonstrated cholelithiasis without cholecystitis.  Surgery was consulted and recommended outpatient follow up.  He was subsequently discharged home.  On 12/1 patient had worsening of pain along with AMS; therefore, came to [facility] ED.  He was quite agitated per reports and received 1mg  dilaudid, 5mg  haldol, 1mg  ativan x 3 and had minimal response.   PCCM was asked to see in consultation for consideration of precedex.  Per chart review, he has no underlying hx of EtOH abuse or substance abuse.  He does have hx of chronic low back pain and chronic opioid use.  On review of outpatient med list, he had 5-325 norco prescribed for q6hrs PRN, 40mg  oxycontin TID, lyrica 200mg  TID, duloxetine 60mg  BID.  Per chart review, he had admission 09/24/18 through 10/06/18 for acute ascending cholangitis s/p ERCP and sphincterotomy with stenting of pancreatic duct (Dr. Therisa Doyne).  During that admission, he did have severe delirium felt to be due to narcotic withdrawal.  He did spend a few days in ICU and was briefly treated with precedex and fentanyl.  He was discharged and supposed to be evaluated by surgery as an outpatient for cholecystectomy; however, this was postponed due to the [place] pandemic.  Per discharge summary, his medication regimen was adjusted prior to d/c and pt had been tolerating it well (oxycodone weaned from 80mg  BID to BID, lyrica reduced from 200mg  TID to 100mg  TID, cymbalta from 60mg  BID to 60mg  daily).   Assessment/Plan:  Active Problems:   Chronic  low back pain   Choledocholithiasis   Acute metabolic encephalopathy   Mental status alteration   ACS (acute coronary syndrome) (HCC)   Endotracheal tube present   Alteration in nutrition   Encephalopathy   Abdominal pain   Non-ST elevation (NSTEMI) myocardial infarction (HCC)   Hypokalemia   DCM (dilated cardiomyopathy) (HCC)  Acute metabolic encephalopathy: improving but with intermittent episodes of confusion. CT scan of the head and MRI of the head was negative.  Lactate, procalcitonin TSH and ammonia within normal limits.  Less likely opioid withdrawal. LP deferred due to clinical improvement.  I spoke with the family and confirmed that patient was not on fentanyl patch at home and was only on OxyContin.  Blood cultures negative. On iv morphine here for pain to avoid withdrawal.   Acute resp failure:  Resolved. Initially intubated for airway protection; status post extubation at this time.  On room air currently.  MRSA in sputum, will continue vancomycin for total of 10 days. No leukocytosis.  Afebrile and stable  Abd pain - no pain today. unclear etiology; though initially concerning for cholecystitis.  Had ERCP with sphincterotomy and pancreatic duct stenting on 09/25/18. CT x 2 shows only cholelithiasis but no active disease. -Off rocephin and flagyl.  HIDA scan showed patent cystic duct without acute cholecystitis.  No abdominal pain today.  LFTs within normal limits except for albumin  Prolonged qt:  - Avoid prolonging agents, - Recheck prn, monitor electrolytes.  Elevated  trop/NSTEMI: - Trop to >2K, now trending down.  No chest pain. -Cardiology on board.  On aspirin.  Cardiology waiting for clinical improvement prior to cardiac catheterization.  HFrEF:  - Newly found on echo EF, normal in 2017. Cardiology on board.  Will need ischemic work-up  Essential HTN:   -On Coreg, spironolactone and losartan at this time.  Continue clonidine patch.  Cardiology on board. Blood  pressure has improved.   hypokalemia.  Potassium of 2.8 despite replacement.  Will give IV potassium x4 again today. Repeat again in PM. Check BMP in a.m..  Magnesium  at 1.8 and phosphorus 2.5.   Dysphagia, poor oral nutrition..  Seen by speech therapy.  Recommend dysphagia 1 diet.  Dietary recommended tube feeding if inadequate oral intake.  MRI of the brain was negative for any stroke.  Seen by neurology for concerns of a stroke  Deconditioning, debility.  Seen by physical therapy.  Inpatient rehabilitation was consulted but now considering SNF  VTE Prophylaxis: Lovenox   Code Status: Full code  Family Communication: none today.  Disposition Plan:   LTAC may be a better disposition  Consultants:  CIR  PCCM  Cardiology  Neurology  Procedures:  Intubation and mechanical ventilation.  Extubation  Antibiotics: Ampicillin 12/4 >12/6 Acyclovir 12/4 >12/6 Ceftriaxone 12/1 > 12/8 Flagyl 12/1 > 12/8 Vancomycin 12/4 >12/14  Anti-infectives (From admission, onward)   Start     Dose/Rate Route Frequency Ordered Stop   07/21/19 0100  vancomycin (VANCOCIN) 1,500 mg in sodium chloride 0.9 % 500 mL IVPB     1,500 mg 250 mL/hr over 120 Minutes Intravenous Every 12 hours 07/20/19 1608 07/26/19 2359   07/20/19 1018  cefTRIAXone (ROCEPHIN) 2 g in sodium chloride 0.9 % 100 mL IVPB  Status:  Discontinued     2 g 200 mL/hr over 30 Minutes Intravenous Every 24 hours 07/19/19 1115 07/20/19 0800   07/20/19 1018  cefTRIAXone (ROCEPHIN) 2 g in sodium chloride 0.9 % 100 mL IVPB  Status:  Discontinued     2 g 200 mL/hr over 30 Minutes Intravenous Every 24 hours 07/20/19 0800 07/20/19 0921   07/17/19 0100  vancomycin (VANCOCIN) IVPB 750 mg/150 ml premix  Status:  Discontinued     750 mg 150 mL/hr over 60 Minutes Intravenous Every 12 hours 07/16/19 1240 07/20/19 1608   07/16/19 1400  acyclovir (ZOVIRAX) 700 mg in dextrose 5 % 100 mL IVPB  Status:  Discontinued     10 mg/kg  69.9 kg 114  mL/hr over 60 Minutes Intravenous Every 8 hours 07/16/19 1240 07/19/19 1115   07/16/19 1245  cefTRIAXone (ROCEPHIN) 2 g in sodium chloride 0.9 % 100 mL IVPB  Status:  Discontinued     2 g 200 mL/hr over 30 Minutes Intravenous Every 12 hours 07/16/19 1240 07/19/19 1115   07/16/19 1245  vancomycin (VANCOCIN) 1,500 mg in sodium chloride 0.9 % 500 mL IVPB     1,500 mg 250 mL/hr over 120 Minutes Intravenous  Once 07/16/19 1240 07/16/19 1609   07/16/19 1245  ampicillin (OMNIPEN) 2 g in sodium chloride 0.9 % 100 mL IVPB  Status:  Discontinued     2 g 300 mL/hr over 20 Minutes Intravenous Every 4 hours 07/16/19 1240 07/19/19 1115   07/13/19 1900  cefTRIAXone (ROCEPHIN) 1 g in sodium chloride 0.9 % 100 mL IVPB  Status:  Discontinued     1 g 200 mL/hr over 30 Minutes Intravenous Every 24 hours 07/13/19 1825 07/16/19 1240  07/13/19 1900  metroNIDAZOLE (FLAGYL) IVPB 500 mg  Status:  Discontinued     500 mg 100 mL/hr over 60 Minutes Intravenous Every 8 hours 07/13/19 1825 07/20/19 0921     Subjective:  remains confused  Wearing bilateral mittens  Objective: Vitals:   07/24/19 0918 07/24/19 1145  BP: (!) 148/104 126/79  Pulse: 96   Resp: 20 (!) 21  Temp: 98.9 F (37.2 C) 98 F (36.7 C)  SpO2: 94% 96%    Intake/Output Summary (Last 24 hours) at 07/24/2019 1259 Last data filed at 07/24/2019 [id number] Gross per 24 hour  Intake 500 ml  Output 500 ml  Net 0 ml   Filed Weights   07/22/19 0315 07/23/19 0300 07/24/19 0337  Weight: 67.1 kg 69.5 kg 73.1 kg   Body mass index is 25.23 kg/m.   Physical Exam: General:  Comfortable, no acute distress HENT: Normocephalic, pupils equally reacting to light and accommodation.  No scleral pallor or icterus noted. Oral mucosa is moist.  Chest: Decreased breath sounds bilaterally, no gross or wheezes noted. CVS: S1 &S2 heard. No murmur.  Regular rate and rhythm. Abdomen: Soft, nontender, normoactive bowel sounds noted Extremities: No cyanosis, clubbing  or edema.  Peripheral pulses are palpable. Psych: Alert, awake and communicative, normal mood CNS:   Confused pleasantly, moves all extremities without any acute obvious focal deficit  Data Review: I have personally reviewed the following laboratory data and studies,  CBC: Recent Labs  Lab 07/17/19 1632 07/18/19 0240 07/19/19 0302 07/21/19 0049 07/22/19 0348 07/23/19 0554 07/24/19 0336  WBC 6.2 7.1 9.3 8.8 8.2 6.8 13.6*  NEUTROABS 4.9 5.5 7.3  --   --   --   --   HGB 11.9* 12.5* 12.6* 13.5 13.6 12.8* 14.2  HCT 33.6* 36.0* 34.7* 37.0* 38.2* 35.6* 39.4  MCV 96.8 97.6 94.0 93.7 96.0 93.7 94.0  PLT 113* 122* 117* 188 227 216 Q000111Q   Basic Metabolic Panel: Recent Labs  Lab 07/19/19 0302 07/20/19 0311 07/21/19 0049 07/22/19 0348 07/23/19 0554 07/24/19 0336  NA 144  --  139 144 140 142  K 3.2*  --  3.3* 2.9* 2.8* 3.5  CL 109  --  105 109 107 108  CO2 25  --  23 26 21* 23  GLUCOSE 148*  --  127* 137* 130* 166*  BUN 17  --  17 20 13 15   CREATININE 0.62 0.60* 0.55* 0.70 0.63 0.74  CALCIUM 8.4*  --  8.4* 8.6* 8.5* 9.0  MG  --   --  1.8 1.9 1.8 2.0  PHOS  --   --  3.1 2.5 2.5  --    Liver Function Tests: Recent Labs  Lab 07/18/19 0240 07/22/19 0348 07/23/19 0554 07/24/19 0336  AST 29 22 24 26   ALT 30 23 21 24   ALKPHOS 65 58 56 66  BILITOT 0.9 1.0 1.3* 1.5*  PROT 5.9* 5.5* 5.5* 6.1*  ALBUMIN 3.1* 3.2* 3.1* 3.5   No results for input(s): LIPASE, AMYLASE in the last 168 hours. Recent Labs  Lab 07/18/19 1515  AMMONIA 40*   Cardiac Enzymes: No results for input(s): CKTOTAL, CKMB, CKMBINDEX, TROPONINI in the last 168 hours. BNP (last 3 results) No results for input(s): BNP in the last 8760 hours.  ProBNP (last 3 results) No results for input(s): PROBNP in the last 8760 hours.  CBG: Recent Labs  Lab 07/23/19 1704 07/23/19 1952 07/24/19 0352 07/24/19 0756 07/24/19 1141  GLUCAP 103* 167* 145* 163* 132*   Recent  Results (from the past 240 hour(s))  Culture,  Urine     Status: None   Collection Time: 07/18/19  8:13 AM   Specimen: Urine, Catheterized  Result Value Ref Range Status   Specimen Description URINE, CATHETERIZED  Final   Special Requests NONE  Final   Culture   Final    NO GROWTH Performed at [facility], [address] [place], [place] [address]    Report Status 07/19/2019 FINAL  Final  Culture, respiratory (non-expectorated)     Status: None   Collection Time: 07/18/19  8:13 AM   Specimen: Tracheal Aspirate; Respiratory  Result Value Ref Range Status   Specimen Description TRACHEAL ASPIRATE  Final   Special Requests NONE  Final   Gram Stain   Final    FEW WBC PRESENT, PREDOMINANTLY PMN RARE GRAM POSITIVE COCCI IN CLUSTERS Performed at [facility], [place] [address] [place], [place] [address]    Culture   Final    FEW STAPHYLOCOCCUS AUREUS RARE STENOTROPHOMONAS MALTOPHILIA    Report Status 07/21/2019 FINAL  Final   Organism ID, Bacteria STAPHYLOCOCCUS AUREUS  Final   Organism ID, Bacteria STENOTROPHOMONAS MALTOPHILIA  Final      Susceptibility   Staphylococcus aureus - MIC*    CIPROFLOXACIN <=0.5 SENSITIVE Sensitive     ERYTHROMYCIN RESISTANT Resistant     GENTAMICIN <=0.5 SENSITIVE Sensitive     OXACILLIN >=4 RESISTANT Resistant     TETRACYCLINE <=1 SENSITIVE Sensitive     VANCOMYCIN 1 SENSITIVE Sensitive     TRIMETH/SULFA <=10 SENSITIVE Sensitive     CLINDAMYCIN RESISTANT Resistant     RIFAMPIN <=0.5 SENSITIVE Sensitive     Inducible Clindamycin POSITIVE Resistant     * FEW STAPHYLOCOCCUS AUREUS   Stenotrophomonas maltophilia - MIC*    LEVOFLOXACIN 4 INTERMEDIATE Intermediate     TRIMETH/SULFA <=20 SENSITIVE Sensitive     * RARE STENOTROPHOMONAS MALTOPHILIA  Culture, blood (routine x 2)     Status: None   Collection Time: 07/18/19  9:24 AM   Specimen: BLOOD  Result Value Ref Range Status   Specimen Description BLOOD LEFT THUMB  Final   Special Requests   Final    BOTTLES DRAWN AEROBIC AND  ANAEROBIC Blood Culture adequate volume   Culture   Final    NO GROWTH 5 DAYS Performed at [facility], [address] [place], [place] [address]    Report Status 07/23/2019 FINAL  Final  Culture, blood (routine x 2)     Status: None   Collection Time: 07/18/19  9:30 AM   Specimen: BLOOD RIGHT HAND  Result Value Ref Range Status   Specimen Description BLOOD RIGHT HAND  Final   Special Requests   Final    BOTTLES DRAWN AEROBIC AND ANAEROBIC Blood Culture adequate volume   Culture   Final    NO GROWTH 5 DAYS Performed at [facility], [place] [address] [place], [place] [address]    Report Status 07/23/2019 FINAL  Final  MRSA PCR Screening     Status: Abnormal   Collection Time: 07/19/19  9:01 AM   Specimen: Nasal Mucosa; Nasopharyngeal  Result Value Ref Range Status   MRSA by PCR POSITIVE (A) NEGATIVE Final    Comment:        The GeneXpert MRSA Assay (FDA approved for NASAL specimens only), is one component of a comprehensive MRSA colonization surveillance program. It is not intended to diagnose MRSA infection nor to  guide or monitor treatment for MRSA infections. RESULT CALLED TO, READ BACK BY AND VERIFIED WITH: Lattie Haw RN 11:10 07/19/19 ([email or web address]) Performed at [facility], [place] [address], [place], [place] [address]      Studies: No results found.  Scheduled Meds: . aspirin  81 mg Oral Daily  . atorvastatin  40 mg Oral q1800  . carvedilol  12.5 mg Oral BID WC  . Chlorhexidine Gluconate Cloth  6 each Topical Daily  . cloNIDine  0.1 mg Transdermal Weekly  . enoxaparin (LOVENOX) injection  40 mg Subcutaneous Q24H  . losartan  50 mg Oral Daily  . mouth rinse  15 mL Mouth Rinse BID  . pantoprazole (PROTONIX) IV  40 mg Intravenous Q24H  . spironolactone  12.5 mg Oral Daily    Continuous Infusions: . vancomycin 1,500 mg (07/24/19 1217)     Benito Mccreedy, MD  [facility] 07/24/2019

## 2019-07-25 ENCOUNTER — Inpatient Hospital Stay (HOSPITAL_COMMUNITY): Payer: Medicare Other

## 2019-07-25 LAB — GLUCOSE, CAPILLARY
Glucose-Capillary: 113 mg/dL — ABNORMAL HIGH (ref 70–99)
Glucose-Capillary: 124 mg/dL — ABNORMAL HIGH (ref 70–99)
Glucose-Capillary: 129 mg/dL — ABNORMAL HIGH (ref 70–99)
Glucose-Capillary: 110 mg/dL — ABNORMAL HIGH (ref 70–99)
Glucose-Capillary: 123 mg/dL — ABNORMAL HIGH (ref 70–99)

## 2019-07-25 IMAGING — CR DG ABDOMEN 1V
1 series · 1 of 1 positions shown · non-contrast
Comparison: [DATE].

CLINICAL DATA: Generalized abdominal pain.

EXAM:
ABDOMEN - 1 VIEW

[abdomen kub]
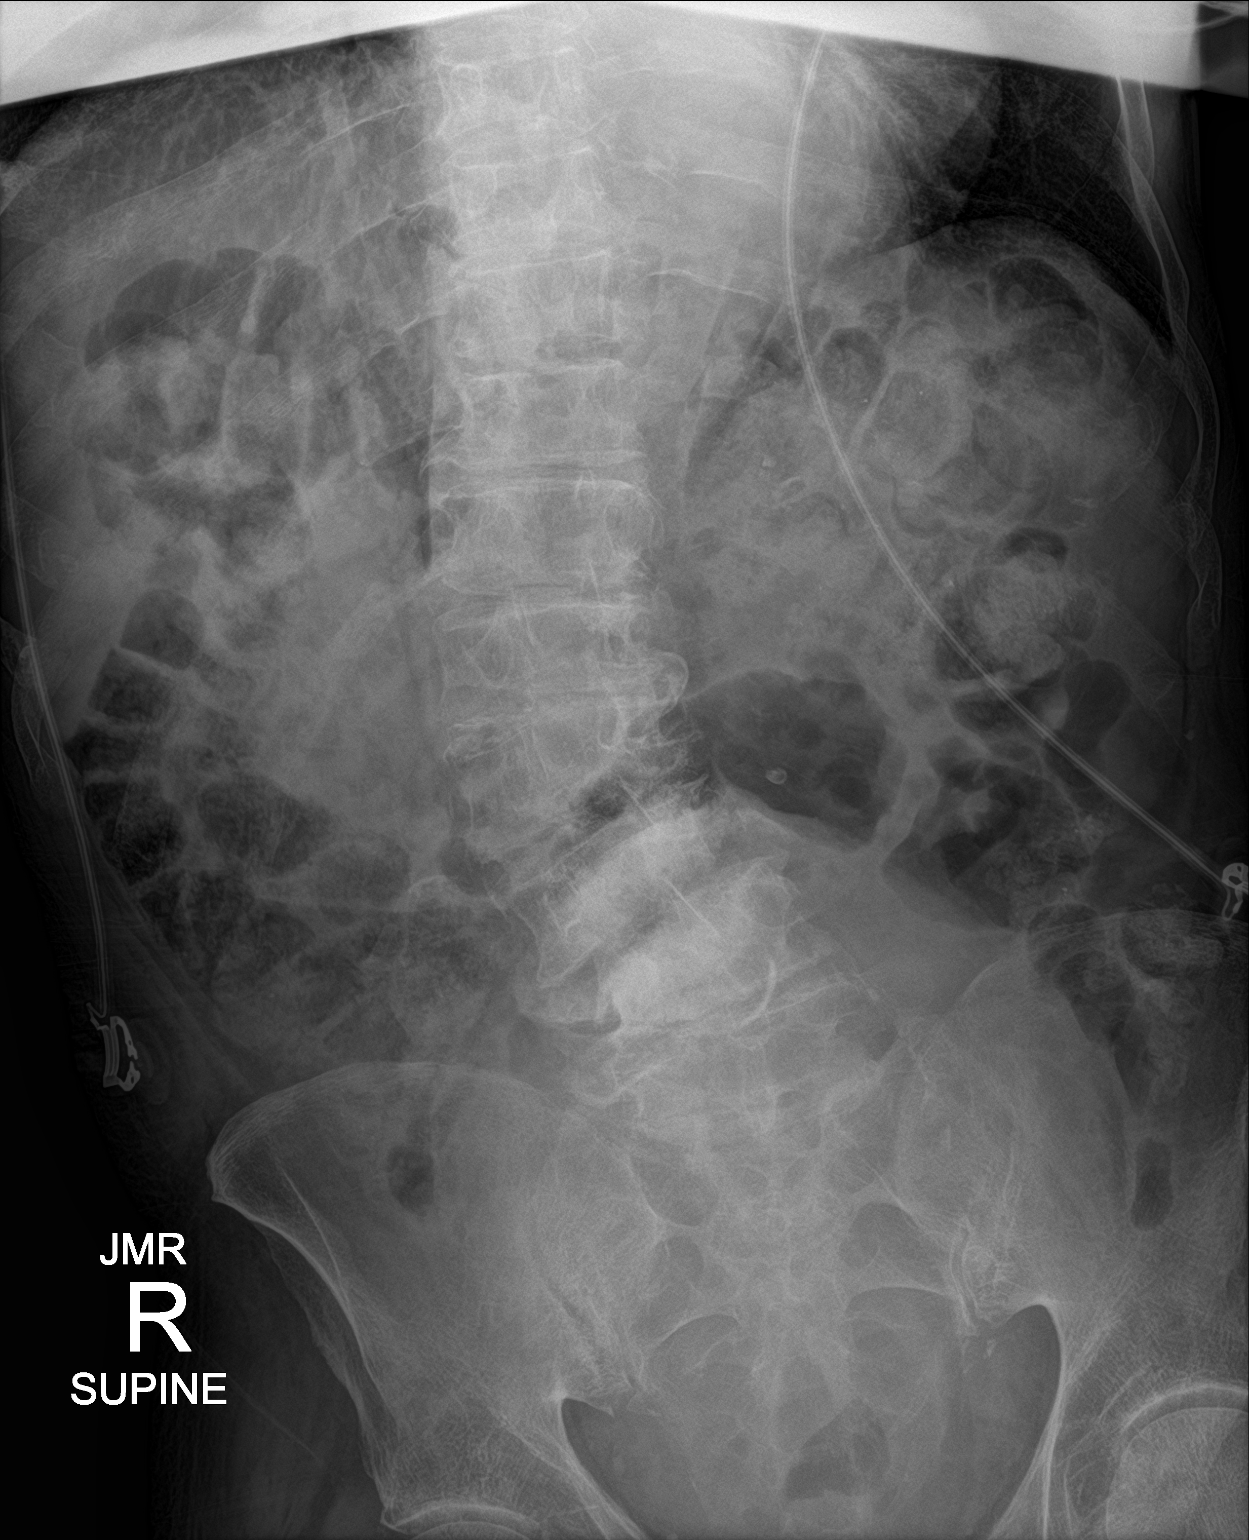

[1 of 1 positions shown; findings below may reference images not displayed]

FINDINGS: No abnormal bowel dilatation is noted. Large amount of stool seen
throughout the colon. No abnormal calcifications are noted.
IMPRESSION: Large stool burden is noted. No evidence of bowel obstruction or
ileus.

## 2019-07-25 MED ORDER — POLYETHYLENE GLYCOL 3350 17 G PO PACK
17.0000 g | PACK | Freq: Every day | ORAL | Status: DC
  Administered 2019-07-25 – 2019-08-01 (×7): 17 g via ORAL
  Filled 2019-07-25 (×8): qty 1

## 2019-07-25 MED ORDER — BISACODYL 5 MG PO TBEC
5.0000 mg | DELAYED_RELEASE_TABLET | Freq: Two times a day (BID) | ORAL | Status: DC
  Administered 2019-07-25 – 2019-08-01 (×15): 5 mg via ORAL
  Filled 2019-07-25 (×16): qty 1

## 2019-07-25 NOTE — Progress Notes (Signed)
   Please see yesterdays rounding note, no additional recs today, please call with questions. Will follow during the week to see if mental status improves to the point we could consider cath. Continue medical therapy at this time. 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 12 lead yesterday showed wandering atrial pacemaker.  I have not seen clear afib.    Carlyle Dolly MD  For questions or updates, please contact [facility] Please consult [email or web address] for contact info under        Signed, Carlyle Dolly, MD  07/25/2019, 8:01 AM

## 2019-07-25 NOTE — Progress Notes (Signed)
PROGRESS NOTE  Riley Eaton [id number] DOB: 19-May-1941 DOA: 07/13/2019 PCP: Aletha Halim., PA-C   LOS: 12 days   Brief narrative/HPI:  Riley Eaton is a 78 y.o. male with PMH including but not limited to thoracic aortic aneurysm, CAD, chronic back pain, chronic opioid use  presented to [facility] ED 12/1 with altered mental status and abd pain.  He had been seen in [facility] ED 1 day prior for abd pain.    Patient had CT of the abd at the time that demonstrated cholelithiasis without cholecystitis.  Surgery was consulted and recommended outpatient follow up.  He was subsequently discharged home.  On 12/1 patient had worsening of pain along with AMS; therefore, came to [facility] ED.  He was quite agitated per reports and received 1mg  dilaudid, 5mg  haldol, 1mg  ativan x 3 and had minimal response.   PCCM was asked to see in consultation for consideration of precedex.  Per chart review, he has no underlying hx of EtOH abuse or substance abuse.  He does have hx of chronic low back pain and chronic opioid use.  On review of outpatient med list, he had 5-325 norco prescribed for q6hrs PRN, 40mg  oxycontin TID, lyrica 200mg  TID, duloxetine 60mg  BID.  Per chart review, he had admission 09/24/18 through 10/06/18 for acute ascending cholangitis s/p ERCP and sphincterotomy with stenting of pancreatic duct (Dr. Therisa Doyne).  During that admission, he did have severe delirium felt to be due to narcotic withdrawal.  He did spend a few days in ICU and was briefly treated with precedex and fentanyl.  He was discharged and supposed to be evaluated by surgery as an outpatient for cholecystectomy; however, this was postponed due to the [place] pandemic.  Per discharge summary, his medication regimen was adjusted prior to d/c and pt had been tolerating it well (oxycodone weaned from 80mg  BID to BID, lyrica reduced from 200mg  TID to 100mg  TID, cymbalta from 60mg  BID to 60mg  daily).   Assessment/Plan:  Active Problems:   Chronic  low back pain   Choledocholithiasis   Acute metabolic encephalopathy   Mental status alteration   ACS (acute coronary syndrome) (HCC)   Endotracheal tube present   Alteration in nutrition   Encephalopathy   Abdominal pain   Non-ST elevation (NSTEMI) myocardial infarction (HCC)   Hypokalemia   DCM (dilated cardiomyopathy) (HCC)  Acute metabolic encephalopathy: improving but with intermittent episodes of confusion. CT scan of the head and MRI of the head was negative.  Lactate, procalcitonin TSH and ammonia within normal limits.  Less likely opioid withdrawal. LP deferred due to clinical improvement.  I spoke with the family and confirmed that patient was not on fentanyl patch at home and was only on OxyContin.  Blood cultures negative. On iv morphine here for pain to avoid withdrawal.  Improving, out of restraints with a sitter  Acute resp failure:  Resolved. Initially intubated for airway protection; status post extubation at this time.  On room air currently.  MRSA in sputum, will continue vancomycin for total of 10 days. No leukocytosis.  Afebrile and stable  Abd pain - no pain today. unclear etiology; though initially concerning for cholecystitis.  Had ERCP with sphincterotomy and pancreatic duct stenting on 09/25/18. CT x 2 shows only cholelithiasis but no active disease. -Off rocephin and flagyl.  HIDA scan showed patent cystic duct without acute cholecystitis.    LFTs within normal limits except for albumin Today, 07/25/2019 - he notes abd pain with guarding on exam -  repeat KUB and consider CT  Prolonged qt:  - Avoid prolonging agents, - Recheck prn, monitor electrolytes.  Elevated trop/NSTEMI: - Trop to >2K, now trending down.  No chest pain. -Cardiology on board.  On aspirin.  Cardiology waiting for clinical improvement prior to cardiac catheterization.  HFrEF:  - Newly found on echo EF, normal in 2017. Cardiology on board.  Will need ischemic work-up  Essential HTN:     -On Coreg, spironolactone and losartan at this time.  Continue clonidine patch.  Cardiology on board. Blood pressure has improved.   hypokalemia.  Potassium of 2.8 despite replacement.  Will give IV potassium x4 again today. Repeat again in PM. Check BMP in a.m..  Magnesium  at 1.8 and phosphorus 2.5.   Dysphagia, poor oral nutrition..  Seen by speech therapy.  Recommend dysphagia 1 diet.  Dietary recommended tube feeding if inadequate oral intake.  MRI of the brain was negative for any stroke.  Seen by neurology for concerns of a stroke  Deconditioning, debility.  Seen by physical therapy.  Inpatient rehabilitation was consulted but now considering SNF  VTE Prophylaxis: Lovenox   Code Status: Full code  Family Communication: none today.  Disposition Plan:   LTAC may be a better disposition  Consultants:  CIR  PCCM  Cardiology  Neurology  Procedures:  Intubation and mechanical ventilation.  Extubation  Antibiotics: Ampicillin 12/4 >12/6 Acyclovir 12/4 >12/6 Ceftriaxone 12/1 > 12/8 Flagyl 12/1 > 12/8 Vancomycin 12/4 >12/14  Anti-infectives (From admission, onward)   Start     Dose/Rate Route Frequency Ordered Stop   07/21/19 0100  vancomycin (VANCOCIN) 1,500 mg in sodium chloride 0.9 % 500 mL IVPB     1,500 mg 250 mL/hr over 120 Minutes Intravenous Every 12 hours 07/20/19 1608 07/26/19 2359   07/20/19 1018  cefTRIAXone (ROCEPHIN) 2 g in sodium chloride 0.9 % 100 mL IVPB  Status:  Discontinued     2 g 200 mL/hr over 30 Minutes Intravenous Every 24 hours 07/19/19 1115 07/20/19 0800   07/20/19 1018  cefTRIAXone (ROCEPHIN) 2 g in sodium chloride 0.9 % 100 mL IVPB  Status:  Discontinued     2 g 200 mL/hr over 30 Minutes Intravenous Every 24 hours 07/20/19 0800 07/20/19 0921   07/17/19 0100  vancomycin (VANCOCIN) IVPB 750 mg/150 ml premix  Status:  Discontinued     750 mg 150 mL/hr over 60 Minutes Intravenous Every 12 hours 07/16/19 1240 07/20/19 1608   07/16/19 1400   acyclovir (ZOVIRAX) 700 mg in dextrose 5 % 100 mL IVPB  Status:  Discontinued     10 mg/kg  69.9 kg 114 mL/hr over 60 Minutes Intravenous Every 8 hours 07/16/19 1240 07/19/19 1115   07/16/19 1245  cefTRIAXone (ROCEPHIN) 2 g in sodium chloride 0.9 % 100 mL IVPB  Status:  Discontinued     2 g 200 mL/hr over 30 Minutes Intravenous Every 12 hours 07/16/19 1240 07/19/19 1115   07/16/19 1245  vancomycin (VANCOCIN) 1,500 mg in sodium chloride 0.9 % 500 mL IVPB     1,500 mg 250 mL/hr over 120 Minutes Intravenous  Once 07/16/19 1240 07/16/19 1609   07/16/19 1245  ampicillin (OMNIPEN) 2 g in sodium chloride 0.9 % 100 mL IVPB  Status:  Discontinued     2 g 300 mL/hr over 20 Minutes Intravenous Every 4 hours 07/16/19 1240 07/19/19 1115   07/13/19 1900  cefTRIAXone (ROCEPHIN) 1 g in sodium chloride 0.9 % 100 mL IVPB  Status:  Discontinued  1 g 200 mL/hr over 30 Minutes Intravenous Every 24 hours 07/13/19 1825 07/16/19 1240   07/13/19 1900  metroNIDAZOLE (FLAGYL) IVPB 500 mg  Status:  Discontinued     500 mg 100 mL/hr over 60 Minutes Intravenous Every 8 hours 07/13/19 1825 07/20/19 0921     Subjective:  remains confused- c/o abd pains  Objective: Vitals:   07/25/19 0407 07/25/19 0800  BP: (!) 163/101 (!) 136/107  Pulse:    Resp:    Temp:  97.7 F (36.5 C)  SpO2:      Intake/Output Summary (Last 24 hours) at 07/25/2019 1052 Last data filed at 07/24/2019 2300 Gross per 24 hour  Intake 75 ml  Output --  Net 75 ml   Filed Weights   07/23/19 0300 07/24/19 0337 07/25/19 0105  Weight: 69.5 kg 73.1 kg 70.1 kg   Body mass index is 24.2 kg/m.   Physical Exam: General:  Comfortable, no acute distress HENT: Normocephalic, pupils equally reacting to light and accommodation.  No scleral pallor or icterus noted. Oral mucosa is moist.  Chest: Decreased breath sounds bilaterally, no gross or wheezes noted. CVS: S1 &S2 heard. No murmur.  Regular rate and rhythm. Abdomen: Guarding/ tight with  mild tenderness, normoactive bowel sounds noted Extremities: No cyanosis, clubbing or edema.  Peripheral pulses are palpable. Psych: Alert, awake and communicative, normal mood CNS:   Confused pleasantly, moves all extremities without any acute obvious focal deficit  Data Review: I have personally reviewed the following laboratory data and studies,  CBC: Recent Labs  Lab 07/19/19 0302 07/21/19 0049 07/22/19 0348 07/23/19 0554 07/24/19 0336  WBC 9.3 8.8 8.2 6.8 13.6*  NEUTROABS 7.3  --   --   --   --   HGB 12.6* 13.5 13.6 12.8* 14.2  HCT 34.7* 37.0* 38.2* 35.6* 39.4  MCV 94.0 93.7 96.0 93.7 94.0  PLT 117* 188 227 216 [id number]   Basic Metabolic Panel: Recent Labs  Lab 07/19/19 0302 07/20/19 0311 07/21/19 0049 07/22/19 0348 07/23/19 0554 07/24/19 0336  NA 144  --  139 144 140 142  K 3.2*  --  3.3* 2.9* 2.8* 3.5  CL 109  --  105 109 107 108  CO2 25  --  23 26 21* 23  GLUCOSE 148*  --  127* 137* 130* 166*  BUN 17  --  17 20 13 15   CREATININE 0.62 0.60* 0.55* 0.70 0.63 0.74  CALCIUM 8.4*  --  8.4* 8.6* 8.5* 9.0  MG  --   --  1.8 1.9 1.8 2.0  PHOS  --   --  3.1 2.5 2.5  --    Liver Function Tests: Recent Labs  Lab 07/22/19 0348 07/23/19 0554 07/24/19 0336  AST 22 24 26   ALT 23 21 24   ALKPHOS 58 56 66  BILITOT 1.0 1.3* 1.5*  PROT 5.5* 5.5* 6.1*  ALBUMIN 3.2* 3.1* 3.5   No results for input(s): LIPASE, AMYLASE in the last 168 hours. Recent Labs  Lab 07/18/19 1515  AMMONIA 40*   Cardiac Enzymes: No results for input(s): CKTOTAL, CKMB, CKMBINDEX, TROPONINI in the last 168 hours. BNP (last 3 results) No results for input(s): BNP in the last 8760 hours.  ProBNP (last 3 results) No results for input(s): PROBNP in the last 8760 hours.  CBG: Recent Labs  Lab 07/24/19 1625 07/24/19 1955 07/24/19 2335 07/25/19 0351 07/25/19 0746  GLUCAP 121* 155* 126* 113* 124*   Recent Results (from the past 240 hour(s))  Culture, Urine  Status: None   Collection Time:  07/18/19  8:13 AM   Specimen: Urine, Catheterized  Result Value Ref Range Status   Specimen Description URINE, CATHETERIZED  Final   Special Requests NONE  Final   Culture   Final    NO GROWTH Performed at [facility], [address], [place], [place] [address]    Report Status 07/19/2019 FINAL  Final  Culture, respiratory (non-expectorated)     Status: None   Collection Time: 07/18/19  8:13 AM   Specimen: Tracheal Aspirate; Respiratory  Result Value Ref Range Status   Specimen Description TRACHEAL ASPIRATE  Final   Special Requests NONE  Final   Gram Stain   Final    FEW WBC PRESENT, PREDOMINANTLY PMN RARE GRAM POSITIVE COCCI IN CLUSTERS Performed at [facility], [place] [address], [place], [place] [address]    Culture   Final    FEW STAPHYLOCOCCUS AUREUS RARE STENOTROPHOMONAS MALTOPHILIA    Report Status 07/21/2019 FINAL  Final   Organism ID, Bacteria STAPHYLOCOCCUS AUREUS  Final   Organism ID, Bacteria STENOTROPHOMONAS MALTOPHILIA  Final      Susceptibility   Staphylococcus aureus - MIC*    CIPROFLOXACIN <=0.5 SENSITIVE Sensitive     ERYTHROMYCIN RESISTANT Resistant     GENTAMICIN <=0.5 SENSITIVE Sensitive     OXACILLIN >=4 RESISTANT Resistant     TETRACYCLINE <=1 SENSITIVE Sensitive     VANCOMYCIN 1 SENSITIVE Sensitive     TRIMETH/SULFA <=10 SENSITIVE Sensitive     CLINDAMYCIN RESISTANT Resistant     RIFAMPIN <=0.5 SENSITIVE Sensitive     Inducible Clindamycin POSITIVE Resistant     * FEW STAPHYLOCOCCUS AUREUS   Stenotrophomonas maltophilia - MIC*    LEVOFLOXACIN 4 INTERMEDIATE Intermediate     TRIMETH/SULFA <=20 SENSITIVE Sensitive     * RARE STENOTROPHOMONAS MALTOPHILIA  Culture, blood (routine x 2)     Status: None   Collection Time: 07/18/19  9:24 AM   Specimen: BLOOD  Result Value Ref Range Status   Specimen Description BLOOD LEFT THUMB  Final   Special Requests   Final    BOTTLES DRAWN AEROBIC AND ANAEROBIC Blood Culture adequate volume    Culture   Final    NO GROWTH 5 DAYS Performed at [facility], [address], [place], [place] [address]    Report Status 07/23/2019 FINAL  Final  Culture, blood (routine x 2)     Status: None   Collection Time: 07/18/19  9:30 AM   Specimen: BLOOD RIGHT HAND  Result Value Ref Range Status   Specimen Description BLOOD RIGHT HAND  Final   Special Requests   Final    BOTTLES DRAWN AEROBIC AND ANAEROBIC Blood Culture adequate volume   Culture   Final    NO GROWTH 5 DAYS Performed at [facility], [place] [address] [place], [place] [address]    Report Status 07/23/2019 FINAL  Final  MRSA PCR Screening     Status: Abnormal   Collection Time: 07/19/19  9:01 AM   Specimen: Nasal Mucosa; Nasopharyngeal  Result Value Ref Range Status   MRSA by PCR POSITIVE (A) NEGATIVE Final    Comment:        The GeneXpert MRSA Assay (FDA approved for NASAL specimens only), is one component of a comprehensive MRSA colonization surveillance program. It is not intended to diagnose MRSA infection nor to guide or monitor treatment for MRSA infections. RESULT CALLED TO, READ BACK BY  AND VERIFIED WITH: Lattie Haw RN 11:10 07/19/19 ([email or web address]) Performed at [facility], [place] [address], [place], [place] [address]      Studies: No results found.  Scheduled Meds: . aspirin  81 mg Oral Daily  . atorvastatin  40 mg Oral q1800  . carvedilol  12.5 mg Oral BID WC  . Chlorhexidine Gluconate Cloth  6 each Topical Daily  . cloNIDine  0.1 mg Transdermal Weekly  . enoxaparin (LOVENOX) injection  40 mg Subcutaneous Q24H  . losartan  50 mg Oral Daily  . mouth rinse  15 mL Mouth Rinse BID  . pantoprazole  40 mg Oral QHS  . spironolactone  12.5 mg Oral Daily    Continuous Infusions: . vancomycin 1,500 mg (07/25/19 0116)     Benito Mccreedy, MD  [facility] 07/25/2019

## 2019-07-25 NOTE — Progress Notes (Signed)
Patients daughter came to visit, I updated her with patients status and that I d/c'd restraints. She agreed to that. No further questions.

## 2019-07-25 NOTE — Plan of Care (Signed)
  Problem: Clinical Measurements: Goal: Will remain free from infection Outcome: Progressing Goal: Diagnostic test results will improve Outcome: Progressing Goal: Cardiovascular complication will be avoided Outcome: Progressing   Problem: Activity: Goal: Risk for activity intolerance will decrease Outcome: Progressing   Problem: Nutrition: Goal: Adequate nutrition will be maintained Outcome: Progressing   Problem: Elimination: Goal: Will not experience complications related to bowel motility Outcome: Progressing   Problem: Pain Managment: Goal: General experience of comfort will improve Outcome: Progressing   Problem: Coping: Goal: Will verbalize feelings Outcome: Progressing   Problem: Health Behavior/Discharge Planning: Goal: Compliance with prescribed medication regimen will improve Outcome: Progressing   Problem: Education: Goal: Knowledge of General Education information will improve Description: Including pain rating scale, medication(s)/side effects and non-pharmacologic comfort measures Outcome: Not Progressing   Problem: Coping: Goal: Level of anxiety will decrease Outcome: Not Progressing   Problem: Safety: Goal: Ability to remain free from injury will improve Outcome: Not Progressing   Problem: Coping: Goal: Coping ability will improve Outcome: Not Progressing   Problem: Health Behavior/Discharge Planning: Goal: Ability to manage health-related needs will improve Outcome: Not Applicable   Problem: Clinical Measurements: Goal: Ability to maintain clinical measurements within normal limits will improve Outcome: Not Applicable Goal: Respiratory complications will improve Outcome: Not Applicable

## 2019-07-26 ENCOUNTER — Inpatient Hospital Stay (HOSPITAL_COMMUNITY): Payer: Medicare Other

## 2019-07-26 LAB — GLUCOSE, CAPILLARY
Glucose-Capillary: 102 mg/dL — ABNORMAL HIGH (ref 70–99)
Glucose-Capillary: 112 mg/dL — ABNORMAL HIGH (ref 70–99)
Glucose-Capillary: 117 mg/dL — ABNORMAL HIGH (ref 70–99)
Glucose-Capillary: 120 mg/dL — ABNORMAL HIGH (ref 70–99)
Glucose-Capillary: 93 mg/dL (ref 70–99)
Glucose-Capillary: 97 mg/dL (ref 70–99)

## 2019-07-26 NOTE — NC FL2 (Signed)
[place] LEVEL OF CARE SCREENING TOOL     IDENTIFICATION  Patient Name: Riley Eaton Birthdate: Oct 30, 1940 Sex: male Admission Date (Current Location): 07/13/2019  [facility] and [place] Number:  Herbalist and Address:  [facility] [place], [place] [address] [place], [place] [address]      Provider Number: [id number]  Attending Physician Name and Address:  Cristal Ford, DO  Relative Name and Phone Number:       Current Level of Care: Hospital Recommended Level of Care: [facility] Prior Approval Number:    Date Approved/Denied:   PASRR Number: [id number]  Discharge Plan: SNF    Current Diagnoses: Patient Active Problem List   Diagnosis Date Noted  . Non-ST elevation (NSTEMI) myocardial infarction ([place])   . Hypokalemia   . DCM (dilated cardiomyopathy) ([place])   . Abdominal pain   . Encephalopathy   . Endotracheal tube present   . Alteration in nutrition   . ACS (acute coronary syndrome) ([place]) 07/14/2019  . Mental status alteration 07/13/2019  . Acute metabolic encephalopathy [id number]  . Protein-calorie malnutrition, severe 09/28/2018  . Nausea & vomiting 09/25/2018  . Choledocholithiasis 09/25/2018  . Chronic pain 09/25/2018  . Urinary retention 09/25/2018  . Constipation 09/25/2018  . Intractable vomiting   . 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 Thoracic aortic aneurysm ([place]) 10/03/2015  . Coronary artery calcification seen on CT scan 10/03/2015    Orientation RESPIRATION BLADDER Height & Weight     Self, Place, Time  Normal External catheter, Incontinent(placed 12/9) Weight: 165 lb 2 oz (74.9 kg) Height:  5\' 7"  (170.2 cm)  BEHAVIORAL SYMPTOMS/MOOD NEUROLOGICAL BOWEL NUTRITION STATUS      Incontinent Diet(DYS 2 diet, nectar thick)  AMBULATORY STATUS COMMUNICATION OF NEEDS Skin   Limited Assist Verbally Normal                       Personal Care Assistance Level of Assistance  Feeding,  Bathing, Dressing Bathing Assistance: Limited assistance Feeding assistance: Independent Dressing Assistance: Limited assistance     Functional Limitations Info  Sight, Speech, Hearing Sight Info: Adequate Hearing Info: Adequate Speech Info: Adequate    SPECIAL CARE FACTORS FREQUENCY  PT (By licensed PT), OT (By licensed OT)     PT Frequency: 5x OT Frequency: 5x            Contractures Contractures Info: Not present    Additional Factors Info  Code Status, Allergies, Isolation Precautions Code Status Info: Full Code Allergies Info: NO known allergies     Isolation Precautions Info: MRSA     Current Medications (07/26/2019):  This is the current hospital active medication list Current Facility-Administered Medications  Medication Dose Route Frequency Provider Last Rate Last Admin  . acetaminophen (TYLENOL) suppository 650 mg  650 mg Rectal Q4H PRN Clydell Hakim, MD   650 mg at 07/24/19 2335  . aspirin chewable tablet 81 mg  81 mg Oral Daily Sueanne Margarita, MD   81 mg at 07/26/19 0845  . atorvastatin (LIPITOR) tablet 40 mg  40 mg Oral q1800 Sueanne Margarita, MD   40 mg at 07/25/19 1608  . bisacodyl (DULCOLAX) EC tablet 5 mg  5 mg Oral BID Benito Mccreedy, MD   5 mg at 07/26/19 0844  . carvedilol (COREG) tablet 12.5 mg  12.5 mg Oral BID WC Arnoldo Lenis, MD   12.5 mg at 07/26/19 0845  . Chlorhexidine Gluconate Cloth  2 % PADS 6 each  6 each Topical Daily Swayze, Ava, DO   6 each at 07/25/19 0944  . cloNIDine (CATAPRES - Dosed in mg/24 hr) patch 0.1 mg  0.1 mg Transdermal Weekly Swayze, Ava, DO   0.1 mg at 07/22/19 2155  . docusate (COLACE) 50 MG/5ML liquid 100 mg  100 mg Per Tube BID PRN Audria Nine, DO      . enoxaparin (LOVENOX) injection 40 mg  40 mg Subcutaneous Q24H Norins, Heinz Knuckles, MD   40 mg at 07/25/19 2315  . haloperidol lactate (HALDOL) injection 5 mg  5 mg Intravenous Q6H PRN Micheline Rough, MD      . hydrALAZINE (APRESOLINE) injection 5 mg  5 mg  Intravenous Q4H PRN Norins, Heinz Knuckles, MD   5 mg at 07/25/19 0407  . LORazepam (ATIVAN) injection 1-2 mg  1-2 mg Intravenous Q4H PRN Micheline Rough, MD   2 mg at 07/18/19 1425  . losartan (COZAAR) tablet 50 mg  50 mg Oral Daily Sueanne Margarita, MD   50 mg at 07/26/19 0845  . MEDLINE mouth rinse  15 mL Mouth Rinse BID Icard, Bradley L, DO   15 mL at 07/26/19 0846  . morphine 2 MG/ML injection 2 mg  2 mg Intravenous Q3H PRN Rush Farmer, MD   2 mg at 07/25/19 2033  . naloxone [facility]) injection 0.4 mg  0.4 mg Intravenous PRN Swayze, Ava, DO      . ondansetron (ZOFRAN) injection 4 mg  4 mg Intravenous Q6H PRN Pokhrel, Laxman, MD   4 mg at 07/26/19 0018  . pantoprazole (PROTONIX) EC tablet 40 mg  40 mg Oral QHS Benito Mccreedy, MD   40 mg at 07/25/19 2315  . polyethylene glycol (MIRALAX / GLYCOLAX) packet 17 g  17 g Oral Daily Osei-Bonsu, Iona Beard, MD   17 g at 07/26/19 [id number]  . Resource ThickenUp Clear   Oral PRN Pokhrel, Laxman, MD      . spironolactone (ALDACTONE) tablet 12.5 mg  12.5 mg Oral Daily Fransico Him R, MD   12.5 mg at 07/26/19 0845  . vancomycin (VANCOCIN) 1,500 mg in sodium chloride 0.9 % 500 mL IVPB  1,500 mg Intravenous Q12H Rolla Flatten, [facility] 250 mL/hr at 07/26/19 0107 1,500 mg at 07/26/19 0107     Discharge Medications: Please see discharge summary for a list of discharge medications.  Relevant Imaging Results:  Relevant Lab Results:   Additional Information [id number]  Eileen Stanford, LCSW

## 2019-07-26 NOTE — Progress Notes (Signed)
Modified Barium Swallow Progress Note  Patient Details  Name: Riley Eaton MRN: [id number] Date of Birth: 1940-09-25  Today's Date: 07/26/2019  Modified Barium Swallow completed.  Full report located under Chart Review in the Imaging Section.  Brief recommendations include the following:  Clinical Impression Pt demonstrates improvement in oropharyngeal swallow when compared to MBS completed 07/21/2019. Pt cognitive impairment continues to exacerbate sensory and motor issues. Pt requires cues for small bites/sips and slow rate.   Orally, pt exhibited tolerance of all consistencies. Extended oral prep of solid texture was noted, as well as missing dentition.   Pharyngeally, pt exhibits delayed swallow across consistencies, with trigger at the level of the pyriform sinus on thin and nectar thick liquids, and at the vallecula on puree and solid textures. Deep flash penetration to the level of the vocal folds was seen during the swallow of thin liquids via cup, with no cough response. No penetration or aspiration seen on nectar thick liquid, puree, or solid consistencies.   Will advance diet to dys 2 with nectar thick liquids, crushed meds, full supervision during po intake for assistance with self feeding. Safe swallow precautions were updated and sent with transport to post at head of the bed. SLP will continue to follow to provide education and assess diet tolerance.    Swallow Evaluation Recommendations  SLP Diet Recommendations: Dysphagia 2 (Fine chop) solids;Nectar thick liquid   Liquid Administration via: Cup;Straw   Medication Administration: Crushed with puree   Supervision: Patient able to self feed;Staff to assist with self feeding;Full supervision/cueing for compensatory strategies   Compensations: Slow rate;Small sips/bites   Postural Changes: Remain semi-upright after after feeds/meals (Comment);Seated upright at 90 degrees   Oral Care Recommendations: Oral care QID   Other Recommendations: Order thickener from pharmacy;Have oral suction available   Enriqueta Shutter, [place], [place] Speech Language Pathologist Office: [phone] Pager: [phone]  Shonna Chock 07/26/2019,10:31 AM

## 2019-07-26 NOTE — Progress Notes (Signed)
Physical Therapy Treatment Patient Details Name: Riley Eaton MRN: [id number] DOB: April 26, 1941 Today's Date: 07/26/2019    History of Present Illness Riley Eaton is a 78 y.o. male who presented to ED 12/1 with abd pain and AMS.  Had been seen 1 day earlier and CT abd / pelv showed cholelithiasis without cholecystitis. Intubated 12/4-12/7. PHMx: Thoracic aortic aneurysm; Chronic low back pain; Choledocholithiasis; Chronic pain; Acute metabolic encephalopathy; ACS. 12/3 CT head: Significant motion limitations. No evidence of acute abnormality    PT Comments    Patient received sitting in chair, safety sitter reports he has been doing well and actually was able to transfer to the chair with nursing earlier today but he has not been feeling well this morning due to stomach upset. Attempted sit to stand multiple times, patient engaging in therapy and able/willing to attempt with Max-totalAx2 but unable to come to standing position due to fatigue, did barely clear buttocks from chair on one attempt. Session limited by fatigue. He was left up in the chair with safety sitter present, all needs otherwise met.    Follow Up Recommendations  CIR;Supervision/Assistance - 24 hour     Equipment Recommendations  Other (comment)(defer)    Recommendations for Other Services       Precautions / Restrictions Precautions Precautions: Fall Precaution Comments: right inattention Restrictions Weight Bearing Restrictions: No    Mobility  Bed Mobility               General bed mobility comments: OOB in chair  Transfers Overall transfer level: Needs assistance   Transfers: Sit to/from Stand Sit to Stand: Total assist;+2 physical assistance         General transfer comment: made 3 attempts to stand up from chair with patient with poor initiation and effort, barely able to clear buttocks from chair. Safety sitter reports he has been up and down to [facility] and had a busy morning, seems  much more fatigued than earlier when he was able to transfer with nursing staff  Ambulation/Gait             General Gait Details: Unable   Stairs             Wheelchair Mobility    Modified Rankin (Stroke Patients Only)       Balance Overall balance assessment: Needs assistance Sitting-balance support: Feet supported Sitting balance-Leahy Scale: Fair Sitting balance - Comments: S for sitting in chair without trunk support                                    Cognition Arousal/Alertness: Awake/alert Behavior During Therapy: Flat affect Overall Cognitive Status: Impaired/Different from baseline Area of Impairment: Orientation;Following commands;Safety/judgement;Problem solving;Attention;Memory;Awareness                 Orientation Level: Disoriented to;Time;Place;Situation Current Attention Level: Focused Memory: Decreased short-term memory Following Commands: Follows one step commands inconsistently;Follows one step commands with increased time Safety/Judgement: Decreased awareness of safety;Decreased awareness of deficits Awareness: Intellectual Problem Solving: Slow processing;Decreased initiation;Difficulty sequencing;Requires verbal cues;Requires tactile cues General Comments: very flat affect and very quiet, safety sitter reports his stomach has been bothering him today. Required simple one step commands and max verbal/tactile cues, extended time      Exercises      General Comments        Pertinent Vitals/Pain Pain Assessment: Faces Pain Score: 0-No pain Faces Pain Scale: No  hurt Pain Location: stomach Pain Descriptors / Indicators: Aching Pain Intervention(s): Limited activity within patient's tolerance;Monitored during session    Home Living                      Prior Function            PT Goals (current goals can now be found in the care plan section) Acute Rehab PT Goals Patient Stated Goal: to be able to  go home PT Goal Formulation: With patient Time For Goal Achievement: 08/03/19 Potential to Achieve Goals: Good Progress towards PT goals: Not progressing toward goals - comment(limited by fatigue and stomach upset today)    Frequency    Min 3X/week      PT Plan Current plan remains appropriate    Co-evaluation              AM-PAC PT "6 Clicks" Mobility   Outcome Measure  Help needed turning from your back to your side while in a flat bed without using bedrails?: A Lot Help needed moving from lying on your back to sitting on the side of a flat bed without using bedrails?: A Lot Help needed moving to and from a bed to a chair (including a wheelchair)?: A Lot Help needed standing up from a chair using your arms (e.g., wheelchair or bedside chair)?: A Lot Help needed to walk in hospital room?: Total Help needed climbing 3-5 steps with a railing? : Total 6 Click Score: 10    End of Session Equipment Utilized During Treatment: Gait belt Activity Tolerance: Patient limited by fatigue Patient left: in chair;with call bell/phone within reach;with nursing/sitter in [employer] present)   PT Visit Diagnosis: Other abnormalities of gait and mobility (R26.89);Muscle weakness (generalized) (M62.81);Difficulty in walking, not elsewhere classified (R26.2)     Time: 1735-6701 PT Time Calculation (min) (ACUTE ONLY): 11 min  Charges:  $Therapeutic Activity: 8-22 mins                     Windell Norfolk, DPT, PN1   Supplemental Physical Therapist Eva    Pager [phone] Acute Rehab Office [phone]

## 2019-07-26 NOTE — TOC Initial Note (Signed)
Transition of Care [facility]) - Initial/Assessment Note    Patient Details  Name: Riley Eaton MRN: [id number] Date of Birth: 08-20-1940  [facility] CM/SW Contact:    Eileen Stanford, LCSW Phone Number: 07/26/2019, 11:14 AM  Clinical Narrative:   CSW spoke with pt's daughter via phone. Pt's daughter states they would like pt to go to [place]. CSW will follow up with SNF.                Expected Discharge Plan: Skilled Nursing Facility Barriers to Discharge: Continued Medical Work up, Ship broker   Patient Goals and CMS Choice Patient states their goals for this hospitalization and ongoing recovery are:: "to get him stronger"-daughter   Choice offered to / list presented to : Adult Children  Expected Discharge Plan and Services Expected Discharge Plan: [place] In-house Referral: NA Discharge Planning Services: NA Post Acute Care Choice: [place] Living arrangements for the past 2 months: Single Family Home                                      Prior Living Arrangements/Services Living arrangements for the past 2 months: Single Family Home Lives with:: Spouse Patient language and need for interpreter reviewed:: Yes Do you feel safe going back to the place where you live?: Yes      Need for Family Participation in Patient Care: Yes (Comment) Care giver support system in place?: Yes (comment)   Criminal Activity/Legal Involvement Pertinent to Current Situation/Hospitalization: No - Comment as needed  Activities of Daily Living      Permission Sought/Granted Permission sought to share information with : Family Supports    Share Information with NAME: michelle  Permission granted to share info w AGENCY: [place]  Permission granted to share info w Relationship: daughter     Emotional Assessment Appearance:: Appears stated age Attitude/Demeanor/Rapport: Unable to Assess Affect (typically  observed): Unable to Assess Orientation: : Oriented to Self, Oriented to Place, Oriented to  Time Alcohol / Substance Use: Not Applicable Psych Involvement: No (comment)  Admission diagnosis:  Abdominal pain [R10.9] ACS (acute coronary syndrome) [facility]) [I24.9] Patient Active Problem List   Diagnosis Date Noted  . Non-ST elevation (NSTEMI) myocardial infarction ([place])   . Hypokalemia   . DCM (dilated cardiomyopathy) ([place])   . Abdominal pain   . Encephalopathy   . Endotracheal tube present   . Alteration in nutrition   . ACS (acute coronary syndrome) ([place]) 07/14/2019  . Mental status alteration 07/13/2019  . Acute metabolic encephalopathy [id number]  . Protein-calorie malnutrition, severe 09/28/2018  . Nausea & vomiting 09/25/2018  . Choledocholithiasis 09/25/2018  . Chronic pain 09/25/2018  . Urinary retention 09/25/2018  . Constipation 09/25/2018  . Intractable vomiting   . 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 Thoracic aortic aneurysm ([place]) 10/03/2015  . Coronary artery calcification seen on CT scan 10/03/2015   PCP:  Aletha Halim., PA-C Pharmacy:   [id number] - [place], [place] - [address] [place] [address] Phone: [phone] Fax: [phone]     Social Determinants of Health (SDOH) Interventions    Readmission Risk Interventions No flowsheet data found.

## 2019-07-26 NOTE — Progress Notes (Signed)
PROGRESS NOTE    Riley Eaton  [id number] DOB: Aug 09, 1941 DOA: 07/13/2019 PCP: Aletha Halim., PA-C   Brief Narrative:  Riley Eaton a 78 y.o.malewith PMH including but not limited to thoracic aortic aneurysm, CAD, chronic back pain, chronic opioid use presented to [facility] ED 12/1 with altered mental status and abd pain. He had been seen in [facility] ED 1 day prior for abd pain.   Patient had CT of the abd at the time that demonstrated cholelithiasis without cholecystitis. Surgery was consulted and recommended outpatient follow up. He was subsequently discharged home.  On 12/1 patient had worsening of pain along with AMS; therefore, came to [facility] ED. He was quite agitated per reports and received 1mg  dilaudid, 5mg  haldol, 1mg  ativan x 3 and had minimal response.  PCCM was asked to see in consultation for consideration of precedex. Per chart review, he has no underlying hx of EtOH abuse or substance abuse. He does have hx of chronic low back pain and chronic opioid use. On review of outpatient med list, he had 5-325 norco prescribed for q6hrs PRN, 40mg  oxycontin TID, lyrica 200mg  TID, duloxetine 60mg  BID.  Per chart review, he had admission 09/24/18 through 10/06/18 for acute ascending cholangitis s/p ERCP and sphincterotomy with stenting of pancreatic duct (Dr. Therisa Doyne). During that admission, he did have severe delirium felt to be due to narcotic withdrawal. He did spend a few days in ICU and was briefly treated with precedex and fentanyl. He was discharged and supposed to be evaluated by surgery as an outpatient for cholecystectomy; however, this was postponed due to the [place] pandemic. Per discharge summary, his medication regimen was adjusted prior to d/c and pt had been tolerating it well (oxycodone weaned from 80mg  BID to BID, lyrica reduced from 200mg  TID to 100mg  TID, cymbalta from 60mg  BID to 60mg  daily).  Patient found to have acute metabolic encephalopathy, unknown cause.   Is also noted to have acute respiratory failure and required intubation for airway protection.  Currently he is on room air.  There were complaints of abdominal pain however HIDA scan was unremarkable.  Patient also noted to have NSTEMI with elevated troponins over 2000.  Cardiology consulted, pending heart catheterization possibly later this week.  Pending placement.  CIR consulted.  Assessment & Plan   Acute metabolic encephalopathy -Appears to be improving however patient continues to have intermittent episodes of confusion -CT head/MRI head negative -Lactate, procalcitonin, TSH, ammonia levels within normal limits -LP deferred as patient clinically has improved -Blood cultures negative to date -Patient was not on fentanyl patch at home only OxyContin.  Was placed on IV morphine here for pain to avoid withdrawal -Continues to have safety sitter at bedside  Acute respiratory failure -It seems that patient was intubated for airway protection -Currently he is on room air -Sputum culture was positive for MRSA -Currently on vancomycin for total of 10 days -Patient is currently afebrile with no leukocytosis  Abdominal pain -Currently no abdominal pain today -Unclear etiology, initially concerning for cholecystitis -tatus post ERCP with sphincterotomy-and pancreatic duct stenting on 09/25/2018 -CT abdomen shows cholelithiasis with no active disease -He was initially placed on Rocephin and Flagyl which have been discontinued -HIDA scan shows patent cystic duct without acute cholecystitis -LFTs within normal limits  Prolonged QT -Avoid offending agents -Monitor potassium and magnesium  Elevated troponin/NSTEMI -Troponin peaked at 2110 on 07/15/2019 -EKG on 07/16/2019 showed deep T wave inversions in the inferior and anterior lateral leads -Echocardiogram 07/14/2019 shows  an EF of 30 to 35%, mildly increased LV hypertrophy.  Grade 1 diastolic dysfunction. -Cardiology consulted and  appreciated -Patient noted to have coronary calcification on CT scan back in February 2020 -No reports of chest pain -Patient needs cardiac catheterization however was found to not be candidate at this time due to unclear source of infection and unclear if he would be able to do DAPT without interruption.  Cardiology hoping for cardiac catheterization later this week -Cardiology recommended aspirin as well as statin, Coreg  Dilated cardiomyopathy/acute systolic heart failure -Echocardiogram as above -Continue losartan, Aldactone, Coreg -Monitor intake and output, daily weights  Essential hypertension -Continue Coreg, spironolactone, losartan, clonidine patch -BP on the softer side  Hypokalemia -Potassium 3.5 on 07/24/2019, will repeat labs and replace if needed  Dysphagia/poor oral intake -Speech therapy recommended dysphagia 1 diet -Neurology was consulted for concerns of stroke however MRI of the brain was unremarkable for stroke  Deconditioning/debility -Seen by physical therapy -Per discussion with social work, patient's wife would like inpatient rehab reconsulted  DVT Prophylaxis Lovenox  Code Status: Full  Family Communication: None at bedside  Disposition Plan: Admitted.  Pending further cardiac intervention and work-up.  Disposition to be determined  Consultants PCCM Cardiology Neurology Inpatient rehab  Procedures  Intubation/extubation Echocardiogram HIDA scan  Antibiotics   Anti-infectives (From admission, onward)   Start     Dose/Rate Route Frequency Ordered Stop   07/21/19 0100  vancomycin (VANCOCIN) 1,500 mg in sodium chloride 0.9 % 500 mL IVPB     1,500 mg 250 mL/hr over 120 Minutes Intravenous Every 12 hours 07/20/19 1608 07/26/19 2359   07/20/19 1018  cefTRIAXone (ROCEPHIN) 2 g in sodium chloride 0.9 % 100 mL IVPB  Status:  Discontinued     2 g 200 mL/hr over 30 Minutes Intravenous Every 24 hours 07/19/19 1115 07/20/19 0800   07/20/19 1018   cefTRIAXone (ROCEPHIN) 2 g in sodium chloride 0.9 % 100 mL IVPB  Status:  Discontinued     2 g 200 mL/hr over 30 Minutes Intravenous Every 24 hours 07/20/19 0800 07/20/19 0921   07/17/19 0100  vancomycin (VANCOCIN) IVPB 750 mg/150 ml premix  Status:  Discontinued     750 mg 150 mL/hr over 60 Minutes Intravenous Every 12 hours 07/16/19 1240 07/20/19 1608   07/16/19 1400  acyclovir (ZOVIRAX) 700 mg in dextrose 5 % 100 mL IVPB  Status:  Discontinued     10 mg/kg  69.9 kg 114 mL/hr over 60 Minutes Intravenous Every 8 hours 07/16/19 1240 07/19/19 1115   07/16/19 1245  cefTRIAXone (ROCEPHIN) 2 g in sodium chloride 0.9 % 100 mL IVPB  Status:  Discontinued     2 g 200 mL/hr over 30 Minutes Intravenous Every 12 hours 07/16/19 1240 07/19/19 1115   07/16/19 1245  vancomycin (VANCOCIN) 1,500 mg in sodium chloride 0.9 % 500 mL IVPB     1,500 mg 250 mL/hr over 120 Minutes Intravenous  Once 07/16/19 1240 07/16/19 1609   07/16/19 1245  ampicillin (OMNIPEN) 2 g in sodium chloride 0.9 % 100 mL IVPB  Status:  Discontinued     2 g 300 mL/hr over 20 Minutes Intravenous Every 4 hours 07/16/19 1240 07/19/19 1115   07/13/19 1900  cefTRIAXone (ROCEPHIN) 1 g in sodium chloride 0.9 % 100 mL IVPB  Status:  Discontinued     1 g 200 mL/hr over 30 Minutes Intravenous Every 24 hours 07/13/19 1825 07/16/19 1240   07/13/19 1900  metroNIDAZOLE (FLAGYL) IVPB 500 mg  Status:  Discontinued     500 mg 100 mL/hr over 60 Minutes Intravenous Every 8 hours 07/13/19 1825 07/20/19 [id number]      Subjective:   Riley Eaton seen and examined today.  Patient currently resting.  Not very interactive this morning has no complaints of chest pain or shortness of breath. Objective:   Vitals:   07/25/19 1957 07/26/19 0356 07/26/19 0741 07/26/19 1146  BP:  128/82 132/87 95/64  Pulse:  86 67   Resp:   14 20  Temp: 98.3 F (36.8 C) 97.8 F (36.6 C) (!) 97.5 F (36.4 C) (!) 97.5 F (36.4 C)  TempSrc: Oral Oral Tympanic Oral  SpO2:   97%    Weight:  74.9 kg    Height:        Intake/Output Summary (Last 24 hours) at 07/26/2019 1417 Last data filed at 07/25/2019 2040 Gross per 24 hour  Intake 620 ml  Output --  Net 620 ml   Filed Weights   07/24/19 0337 07/25/19 0105 07/26/19 0356  Weight: 73.1 kg 70.1 kg 74.9 kg    Exam  General: Well developed, ill-appearing, NAD  HEENT: NCAT, mucous membranes moist.   Cardiovascular: S1 S2 auscultated, RRR, no murmur  Respiratory: Clear to auscultation bilaterally   Abdomen: Soft, nontender, nondistended, + bowel sounds  Extremities: warm dry without cyanosis clubbing or edema  Neuro: AAOx2 (self, place), no new focal deficits  Psych: Appropriate mood and affect   Data Reviewed: I have personally reviewed following labs and imaging studies  CBC: Recent Labs  Lab 07/21/19 0049 07/22/19 0348 07/23/19 0554 07/24/19 0336  WBC 8.8 8.2 6.8 13.6*  HGB 13.5 13.6 12.8* 14.2  HCT 37.0* 38.2* 35.6* 39.4  MCV 93.7 96.0 93.7 94.0  PLT 188 227 216 [id number]   Basic Metabolic Panel: Recent Labs  Lab 07/20/19 0311 07/21/19 0049 07/22/19 0348 07/23/19 0554 07/24/19 0336  NA  --  139 144 140 142  K  --  3.3* 2.9* 2.8* 3.5  CL  --  105 109 107 108  CO2  --  23 26 21* 23  GLUCOSE  --  127* 137* 130* 166*  BUN  --  17 20 13 15   CREATININE 0.60* 0.55* 0.70 0.63 0.74  CALCIUM  --  8.4* 8.6* 8.5* 9.0  MG  --  1.8 1.9 1.8 2.0  PHOS  --  3.1 2.5 2.5  --    GFR: Estimated Creatinine Clearance: 70 mL/min (by C-G formula based on SCr of 0.74 mg/dL). Liver Function Tests: Recent Labs  Lab 07/22/19 0348 07/23/19 0554 07/24/19 0336  AST 22 24 26   ALT 23 21 24   ALKPHOS 58 56 66  BILITOT 1.0 1.3* 1.5*  PROT 5.5* 5.5* 6.1*  ALBUMIN 3.2* 3.1* 3.5   No results for input(s): LIPASE, AMYLASE in the last 168 hours. No results for input(s): AMMONIA in the last 168 hours. Coagulation Profile: No results for input(s): INR, PROTIME in the last 168 hours. Cardiac  Enzymes: No results for input(s): CKTOTAL, CKMB, CKMBINDEX, TROPONINI in the last 168 hours. BNP (last 3 results) No results for input(s): PROBNP in the last 8760 hours. HbA1C: No results for input(s): HGBA1C in the last 72 hours. CBG: Recent Labs  Lab 07/25/19 2004 07/26/19 0014 07/26/19 0358 07/26/19 0758 07/26/19 1140  GLUCAP 123* 120* 117* 93 112*   Lipid Profile: No results for input(s): CHOL, HDL, LDLCALC, TRIG, CHOLHDL, LDLDIRECT in the last 72 hours. Thyroid Function Tests: No results for  input(s): TSH, T4TOTAL, FREET4, T3FREE, THYROIDAB in the last 72 hours. Anemia Panel: No results for input(s): VITAMINB12, FOLATE, FERRITIN, TIBC, IRON, RETICCTPCT in the last 72 hours. Urine analysis:    Component Value Date/Time   COLORURINE YELLOW 07/14/2019 0410   APPEARANCEUR CLEAR 07/14/2019 0410   LABSPEC 1.021 07/14/2019 0410   PHURINE 5.0 07/14/2019 0410   GLUCOSEU NEGATIVE 07/14/2019 0410   HGBUR MODERATE (A) 07/14/2019 0410   BILIRUBINUR NEGATIVE 07/14/2019 0410   KETONESUR 20 (A) 07/14/2019 0410   PROTEINUR 30 (A) 07/14/2019 0410   NITRITE NEGATIVE 07/14/2019 0410   LEUKOCYTESUR NEGATIVE 07/14/2019 0410   Sepsis Labs: @LABRCNTIP (procalcitonin:4,lacticidven:4)  ) Recent Results (from the past 240 hour(s))  Culture, Urine     Status: None   Collection Time: 07/18/19  8:13 AM   Specimen: Urine, Catheterized  Result Value Ref Range Status   Specimen Description URINE, CATHETERIZED  Final   Special Requests NONE  Final   Culture   Final    NO GROWTH Performed at [facility], [address] [place], [place] [address]    Report Status 07/19/2019 FINAL  Final  Culture, respiratory (non-expectorated)     Status: None   Collection Time: 07/18/19  8:13 AM   Specimen: Tracheal Aspirate; Respiratory  Result Value Ref Range Status   Specimen Description TRACHEAL ASPIRATE  Final   Special Requests NONE  Final   Gram Stain   Final    FEW WBC PRESENT,  PREDOMINANTLY PMN RARE GRAM POSITIVE COCCI IN CLUSTERS Performed at [facility], [place] [address], [place], [place] [address]    Culture   Final    FEW STAPHYLOCOCCUS AUREUS RARE STENOTROPHOMONAS MALTOPHILIA    Report Status 07/21/2019 FINAL  Final   Organism ID, Bacteria STAPHYLOCOCCUS AUREUS  Final   Organism ID, Bacteria STENOTROPHOMONAS MALTOPHILIA  Final      Susceptibility   Staphylococcus aureus - MIC*    CIPROFLOXACIN <=0.5 SENSITIVE Sensitive     ERYTHROMYCIN RESISTANT Resistant     GENTAMICIN <=0.5 SENSITIVE Sensitive     OXACILLIN >=4 RESISTANT Resistant     TETRACYCLINE <=1 SENSITIVE Sensitive     VANCOMYCIN 1 SENSITIVE Sensitive     TRIMETH/SULFA <=10 SENSITIVE Sensitive     CLINDAMYCIN RESISTANT Resistant     RIFAMPIN <=0.5 SENSITIVE Sensitive     Inducible Clindamycin POSITIVE Resistant     * FEW STAPHYLOCOCCUS AUREUS   Stenotrophomonas maltophilia - MIC*    LEVOFLOXACIN 4 INTERMEDIATE Intermediate     TRIMETH/SULFA <=20 SENSITIVE Sensitive     * RARE STENOTROPHOMONAS MALTOPHILIA  Culture, blood (routine x 2)     Status: None   Collection Time: 07/18/19  9:24 AM   Specimen: BLOOD  Result Value Ref Range Status   Specimen Description BLOOD LEFT THUMB  Final   Special Requests   Final    BOTTLES DRAWN AEROBIC AND ANAEROBIC Blood Culture adequate volume   Culture   Final    NO GROWTH 5 DAYS Performed at [facility], [address], [place], [place] [address]    Report Status 07/23/2019 FINAL  Final  Culture, blood (routine x 2)     Status: None   Collection Time: 07/18/19  9:30 AM   Specimen: BLOOD RIGHT HAND  Result Value Ref Range Status   Specimen Description BLOOD RIGHT HAND  Final   Special Requests   Final    BOTTLES DRAWN AEROBIC AND ANAEROBIC Blood Culture adequate volume   Culture  Final    NO GROWTH 5 DAYS Performed at [facility], [place] [address], [place], [place] [address]    Report Status 07/23/2019 FINAL  Final  MRSA PCR  Screening     Status: Abnormal   Collection Time: 07/19/19  9:01 AM   Specimen: Nasal Mucosa; Nasopharyngeal  Result Value Ref Range Status   MRSA by PCR POSITIVE (A) NEGATIVE Final    Comment:        The GeneXpert MRSA Assay (FDA approved for NASAL specimens only), is one component of a comprehensive MRSA colonization surveillance program. It is not intended to diagnose MRSA infection nor to guide or monitor treatment for MRSA infections. RESULT CALLED TO, READ BACK BY AND VERIFIED WITH: Lattie Haw RN 11:10 07/19/19 ([email or web address]) Performed at [facility], [place] [address], [place],  [address]       Radiology Studies: DG Abd 1 View  Result Date: 07/25/2019 CLINICAL DATA:  Generalized abdominal pain. EXAM: ABDOMEN - 1 VIEW COMPARISON:  July 16, 2019. FINDINGS: No abnormal bowel dilatation is noted. Large amount of stool seen throughout the colon. No abnormal calcifications are noted. IMPRESSION: Large stool burden is noted. No evidence of bowel obstruction or ileus. Electronically Signed   By: Marijo Conception M.D.   On: 07/25/2019 13:30   DG Swallowing Func-Speech Pathology  Result Date: 07/26/2019 Objective Swallowing Evaluation: Type of Study: MBS-Modified Barium Swallow Study  Patient Details Name: DAYLON ROHLFS MRN: [id number] Date of Birth: 12-29-1940 Today's Date: 07/26/2019 Time: SLP Start Time (ACUTE ONLY): 0940 -SLP Stop Time (ACUTE ONLY): 1010 SLP Time Calculation (min) (ACUTE ONLY): 30 min Past Medical History: Past Medical History: Diagnosis Date . Chronic back pain  . Chronic low back pain 05/19/2017 . Chronic, continuous use of opioids   for back pain . Coronary artery calcification seen on CT scan  . Nerve pain  . Thoracic aortic aneurysm [facility])  Past Surgical History: Past Surgical History: Procedure Laterality Date . BACK SURGERY    09-2016 . back surgey   . ENDOSCOPIC RETROGRADE CHOLANGIOPANCREATOGRAPHY (ERCP) WITH PROPOFOL N/A 09/25/2018  Procedure: ENDOSCOPIC  RETROGRADE CHOLANGIOPANCREATOGRAPHY (ERCP) WITH PROPOFOL;  Surgeon: Ronnette Juniper, MD;  Location: [place];  Service: Gastroenterology;  Laterality: N/A; . PANCREATIC STENT PLACEMENT  09/25/2018  Procedure: PANCREATIC STENT PLACEMENT;  Surgeon: Ronnette Juniper, MD;  Location: [facility] ENDOSCOPY;  Service: Gastroenterology;; . REMOVAL OF STONES  09/25/2018  Procedure: REMOVAL OF STONES;  Surgeon: Ronnette Juniper, MD;  Location: [facility] ENDOSCOPY;  Service: Gastroenterology;; . [name]  09/25/2018  Procedure: SPHINCTEROTOMY;  Surgeon: Ronnette Juniper, MD;  Location: [facility] ENDOSCOPY;  Service: Gastroenterology;; HPI: EILERT OCHELTREE is a 78 y.o. male who presented to ED 12/1 with abd pain and AMS.  Had been seen 1 day earlier and CT abd / pelv showed cholelithiasis without cholecystitis. He does have hx of chronic low back pain and chronic opioid use. Intubated 12/4-12/7  Subjective: Pt was seen in radiology for repeat MBS to determine appropriateness for advanced consistencies. Assessment / Plan / Recommendation CHL IP CLINICAL IMPRESSIONS 07/26/2019 Clinical Impression Pt demonstrates improvement in oropharyngeal swallow when compared to MBS completed 07/21/2019. Pt cognitive impairment continues to exacerbate sensory and motor issues. Pt requires cues for small bites/sips and slow rate. Orally, pt exhibited tolerance of all consistencies. Extended oral prep of solid texture was noted, as well as missing dentition. Pharyngeally, pt exhibits delayed swallow across consistencies, with trigger at the level of the pyriform sinus on thin and nectar thick  liquids, and at the vallecula on puree and solid textures. Deep flash penetration to the level of the vocal folds was seen during the swallow of thin liquids via cup, with no cough response. No penetration or aspiration seen on nectar thick liquid, puree, or solid consistencies. Will advance diet to dys 2 with nectar thick liquids, crushed meds, full supervision during po intake for  assistance with self feeding. Safe swallow precautions were updated and sent with transport to post at head of the bed. SLP will continue to follow to provide education and assess diet tolerance.  SLP Visit Diagnosis Dysphagia, oropharyngeal phase (R13.12)     Impact on safety and function Moderate aspiration risk   CHL IP TREATMENT RECOMMENDATION 07/26/2019 Treatment Recommendations Therapy as outlined in treatment plan below   Prognosis 07/26/2019 Prognosis for Safe Diet Advancement Fair Barriers to Reach Goals Cognitive deficits   CHL IP DIET RECOMMENDATION 07/26/2019 SLP Diet Recommendations Dysphagia 2 (Fine chop) solids;Nectar thick liquid Liquid Administration via Cup;Straw Medication Administration Crushed with puree Compensations Slow rate;Small sips/bites Postural Changes Remain semi-upright after after feeds/meals (Comment);Seated upright at 90 degrees   CHL IP OTHER RECOMMENDATIONS 07/26/2019   Oral Care Recommendations Oral care QID Other Recommendations Order thickener from pharmacy;Have oral suction available   CHL IP FOLLOW UP RECOMMENDATIONS 07/26/2019 Follow up Recommendations Skilled Nursing facility;24 hour supervision/assistance   CHL IP FREQUENCY AND DURATION 07/26/2019 Speech Therapy Frequency (ACUTE ONLY) min 2x/week Treatment Duration 2 weeks      CHL IP ORAL PHASE 07/26/2019 Oral Phase Impaired   Oral - Mech Soft Impaired mastication;Delayed oral transit    CHL IP PHARYNGEAL PHASE 07/26/2019 Pharyngeal Phase Impaired   Pharyngeal- Nectar Teaspoon Delayed swallow initiation-pyriform sinuses   Pharyngeal- Thin Cup Delayed swallow initiation-pyriform sinuses;Reduced airway/laryngeal closure;Penetration/Aspiration during swallow Pharyngeal Material enters airway, CONTACTS cords and not ejected out   Pharyngeal- Puree Delayed swallow initiation-vallecula   Pharyngeal- Mechanical Soft Delayed swallow initiation-vallecula  CHL IP CERVICAL ESOPHAGEAL PHASE 07/26/2019 Cervical Esophageal Phase [facility]  Enriqueta Shutter, MSP, CCC-SLP Speech Language Pathologist Office: [phone] Pager: [phone] Shonna Chock 07/26/2019, 10:28 AM                Scheduled Meds: . aspirin  81 mg Oral Daily  . atorvastatin  40 mg Oral q1800  . bisacodyl  5 mg Oral BID  . carvedilol  12.5 mg Oral BID WC  . Chlorhexidine Gluconate Cloth  6 each Topical Daily  . cloNIDine  0.1 mg Transdermal Weekly  . enoxaparin (LOVENOX) injection  40 mg Subcutaneous Q24H  . losartan  50 mg Oral Daily  . mouth rinse  15 mL Mouth Rinse BID  . pantoprazole  40 mg Oral QHS  . polyethylene glycol  17 g Oral Daily  . spironolactone  12.5 mg Oral Daily   Continuous Infusions: . vancomycin 1,500 mg (07/26/19 1237)     LOS: 13 days   Time Spent in minutes   45 minutes  Mylin Gignac D.O. on 07/26/2019 at 2:17 PM  Between 7am to 7pm - Please see pager noted on [email or web address]  After 7pm go to [email or web address]  And look for the night coverage person covering for me after hours  [facility] Office  [phone]

## 2019-07-26 NOTE — Progress Notes (Signed)
Inpatient Rehabilitation Admissions Coordinator  Received Rehab consult. I spoke with pt's wife and daughter last week and wife is having surgery Tuesday and patient does not have the needed caregiver support at home to be eligible for an inpt rehab admit. SNF is recommended. I discussed with Caren Griffins, SW and we will sign off .  Danne Baxter, RN, MSN Rehab Admissions Coordinator [phone] 07/26/2019 2:52 PM

## 2019-07-26 NOTE — Progress Notes (Signed)
Primary Cardiologist:  Gwenlyn Found  Subjective:  Denies SSCP, palpitations or Dyspnea Oriented to place only   Objective:  Vitals:   07/25/19 1600 07/25/19 1957 07/26/19 0356 07/26/19 0741  BP:   128/82 132/87  Pulse:   86 67  Resp:    14  Temp: (!) 97.3 F (36.3 C) 98.3 F (36.8 C) 97.8 F (36.6 C) (!) 97.5 F (36.4 C)  TempSrc: Axillary Oral Oral Tympanic  SpO2:   97%   Weight:   74.9 kg   Height:        Intake/Output from previous day:  Intake/Output Summary (Last 24 hours) at 07/26/2019 [id number] Last data filed at 07/25/2019 2040 Gross per 24 hour  Intake 620 ml  Output -  Net 620 ml    Physical Exam: Affect appropriate Healthy:  appears stated age HEENT: normal Neck supple with no adenopathy JVP normal no bruits no thyromegaly Lungs clear with no wheezing and good diaphragmatic motion Heart:  S1/S2 no murmur, no rub, gallop or click PMI normal Abdomen: benighn, BS positve, no tenderness, no AAA no bruit.  No HSM or HJR Distal pulses intact with no bruits No edema Neuro non-focal Skin warm and dry No muscular weakness   Lab Results: Basic Metabolic Panel: Recent Labs    07/24/19 0336  NA 142  K 3.5  CL 108  CO2 23  GLUCOSE 166*  BUN 15  CREATININE 0.74  CALCIUM 9.0  MG 2.0   Liver Function Tests: Recent Labs    07/24/19 0336  AST 26  ALT 24  ALKPHOS 66  BILITOT 1.5*  PROT 6.1*  ALBUMIN 3.5   No results for input(s): LIPASE, AMYLASE in the last 72 hours. CBC: Recent Labs    07/24/19 0336  WBC 13.6*  HGB 14.2  HCT 39.4  MCV 94.0  PLT 329    Imaging: DG Abd 1 View  Result Date: 07/25/2019 CLINICAL DATA:  Generalized abdominal pain. EXAM: ABDOMEN - 1 VIEW COMPARISON:  July 16, 2019. FINDINGS: No abnormal bowel dilatation is noted. Large amount of stool seen throughout the colon. No abnormal calcifications are noted. IMPRESSION: Large stool burden is noted. No evidence of bowel obstruction or ileus. Electronically Signed   By:  Marijo Conception M.D.   On: 07/25/2019 13:30    Cardiac Studies:  ECG: SR rate 100 lateral T wave inversions    Telemetry: SR PaC  Echo: EF 30-35% 07/14/19     Medications:   . aspirin  81 mg Oral Daily  . atorvastatin  40 mg Oral q1800  . bisacodyl  5 mg Oral BID  . carvedilol  12.5 mg Oral BID WC  . Chlorhexidine Gluconate Cloth  6 each Topical Daily  . cloNIDine  0.1 mg Transdermal Weekly  . enoxaparin (LOVENOX) injection  40 mg Subcutaneous Q24H  . losartan  50 mg Oral Daily  . mouth rinse  15 mL Mouth Rinse BID  . pantoprazole  40 mg Oral QHS  . polyethylene glycol  17 g Oral Daily  . spironolactone  12.5 mg Oral Daily     . vancomycin 1,500 mg (07/26/19 0107)    Assessment/Plan:   1. SEMI:  Lateral ECG changes EF 30-35% troponin peak 2110 MS still not normal  He has no signs of angina or CHF. Continue ASA statin and beta blocker Hopefully can plan on cath latter this week  2. DCM:  Likely ischemic continue losartan and aldactone    Jenkins Rouge 07/26/2019, 9:37  AM    

## 2019-07-27 ENCOUNTER — Inpatient Hospital Stay (HOSPITAL_COMMUNITY): Payer: Medicare Other

## 2019-07-27 LAB — RENAL FUNCTION PANEL
Albumin: 3 g/dL — ABNORMAL LOW (ref 3.5–5.0)
Anion gap: 10 (ref 5–15)
BUN: 43 mg/dL — ABNORMAL HIGH (ref 8–23)
CO2: 18 mmol/L — ABNORMAL LOW (ref 22–32)
Calcium: 8.1 mg/dL — ABNORMAL LOW (ref 8.9–10.3)
Chloride: 106 mmol/L (ref 98–111)
Creatinine, Ser: 3.27 mg/dL — ABNORMAL HIGH (ref 0.61–1.24)
GFR calc Af Amer: 20 mL/min — ABNORMAL LOW (ref >60)
GFR calc non Af Amer: 17 mL/min — ABNORMAL LOW (ref >60)
Glucose, Bld: 213 mg/dL — ABNORMAL HIGH (ref 70–99)
Phosphorus: 5.1 mg/dL — ABNORMAL HIGH (ref 2.5–4.6)
Potassium: 3.8 mmol/L (ref 3.5–5.1)
Sodium: 134 mmol/L — ABNORMAL LOW (ref 135–145)

## 2019-07-27 LAB — BASIC METABOLIC PANEL
Anion gap: 10 (ref 5–15)
BUN: 38 mg/dL — ABNORMAL HIGH (ref 8–23)
CO2: 22 mmol/L (ref 22–32)
Calcium: 8.3 mg/dL — ABNORMAL LOW (ref 8.9–10.3)
Chloride: 106 mmol/L (ref 98–111)
Creatinine, Ser: 2.92 mg/dL — ABNORMAL HIGH (ref 0.61–1.24)
GFR calc Af Amer: 23 mL/min — ABNORMAL LOW (ref >60)
GFR calc non Af Amer: 20 mL/min — ABNORMAL LOW (ref >60)
Glucose, Bld: 119 mg/dL — ABNORMAL HIGH (ref 70–99)
Potassium: 4.4 mmol/L (ref 3.5–5.1)
Sodium: 138 mmol/L (ref 135–145)

## 2019-07-27 LAB — URINALYSIS, ROUTINE W REFLEX MICROSCOPIC
Bilirubin Urine: NEGATIVE
Glucose, UA: NEGATIVE mg/dL
Ketones, ur: NEGATIVE mg/dL
Leukocytes,Ua: NEGATIVE
Nitrite: NEGATIVE
Protein, ur: 30 mg/dL — AB
RBC / HPF: >50 RBC/hpf — ABNORMAL HIGH (ref 0–5)
Specific Gravity, Urine: 1.013 (ref 1.005–1.030)
pH: 5 (ref 5.0–8.0)

## 2019-07-27 LAB — CBC
HCT: 30.8 % — ABNORMAL LOW (ref 39.0–52.0)
Hemoglobin: 10.9 g/dL — ABNORMAL LOW (ref 13.0–17.0)
MCH: 34.6 pg — ABNORMAL HIGH (ref 26.0–34.0)
MCHC: 35.4 g/dL (ref 30.0–36.0)
MCV: 97.8 fL (ref 80.0–100.0)
Platelets: 190 10*3/uL (ref 150–400)
RBC: 3.15 MIL/uL — ABNORMAL LOW (ref 4.22–5.81)
RDW: 14.3 % (ref 11.5–15.5)
WBC: 10.4 10*3/uL (ref 4.0–10.5)
nRBC: 0 % (ref 0.0–0.2)

## 2019-07-27 LAB — SODIUM, URINE, RANDOM: Sodium, Ur: 67 mmol/L

## 2019-07-27 LAB — OSMOLALITY, URINE: Osmolality, Ur: 440 mOsm/kg (ref 300–900)

## 2019-07-27 LAB — GLUCOSE, CAPILLARY
Glucose-Capillary: 100 mg/dL — ABNORMAL HIGH (ref 70–99)
Glucose-Capillary: 163 mg/dL — ABNORMAL HIGH (ref 70–99)
Glucose-Capillary: 176 mg/dL — ABNORMAL HIGH (ref 70–99)
Glucose-Capillary: 89 mg/dL (ref 70–99)
Glucose-Capillary: 93 mg/dL (ref 70–99)
Glucose-Capillary: 95 mg/dL (ref 70–99)

## 2019-07-27 LAB — MAGNESIUM: Magnesium: 2.2 mg/dL (ref 1.7–2.4)

## 2019-07-27 LAB — CREATININE, URINE, RANDOM: Creatinine, Urine: 94.12 mg/dL

## 2019-07-27 LAB — OSMOLALITY: Osmolality: 298 mOsm/kg — ABNORMAL HIGH (ref 275–295)

## 2019-07-27 IMAGING — US US RENAL
1 series · 14 of 25 positions shown · non-contrast
Comparison: CT [DATE].

CLINICAL DATA: Acute renal injury.

EXAM:
RENAL / URINARY TRACT ULTRASOUND COMPLETE

[Series 1: us renal · 14 of 31 slices shown]
[im 1/31]
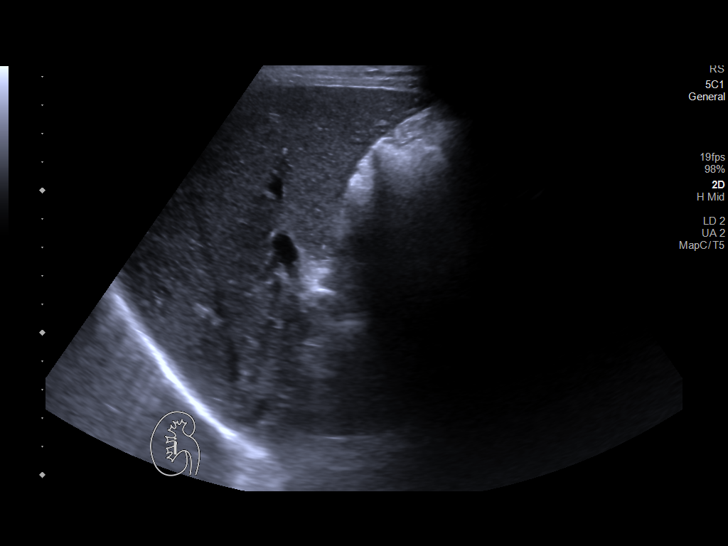
[im 3/31]
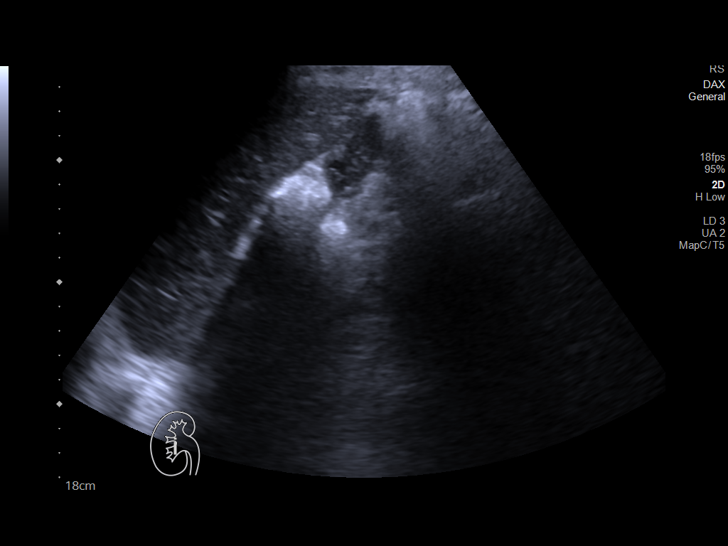
[im 6/31]
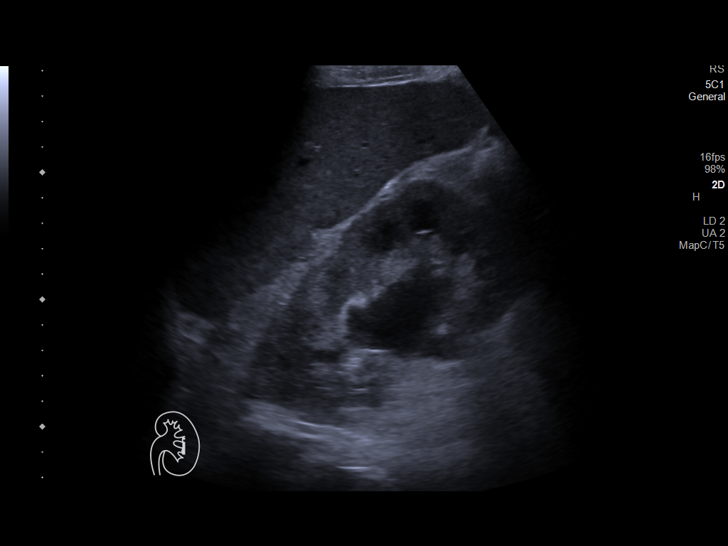
[im 8/31]
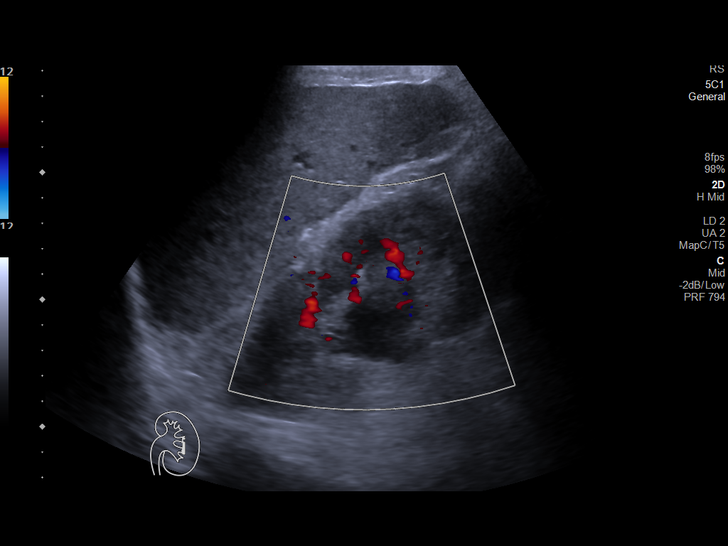
[im 11/31]
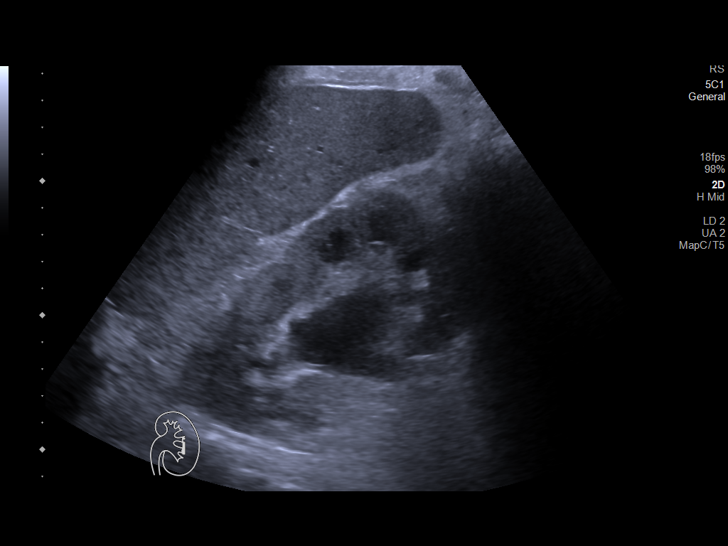
[im 12/31]
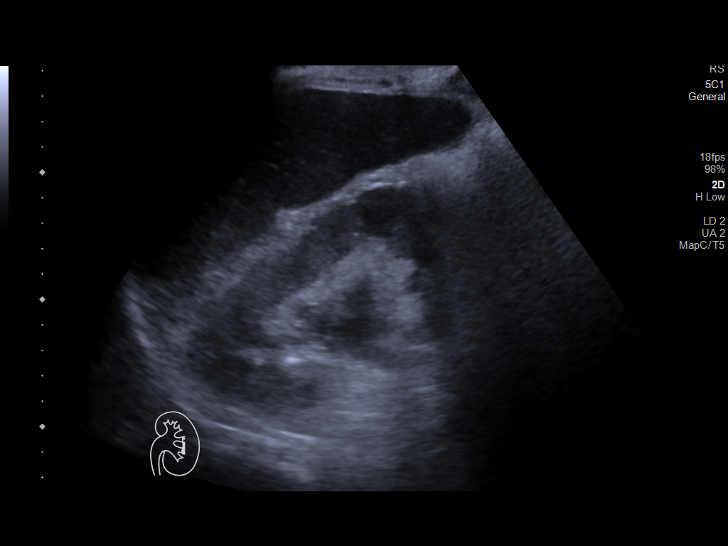
[im 14/31]
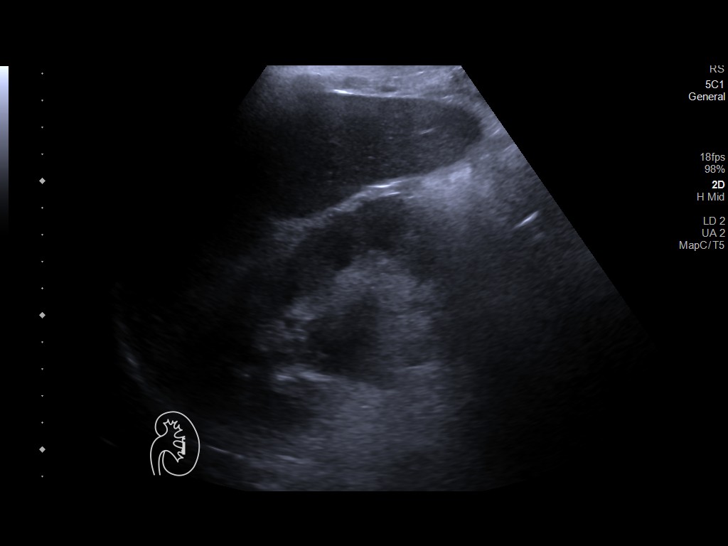
[im 17/31]
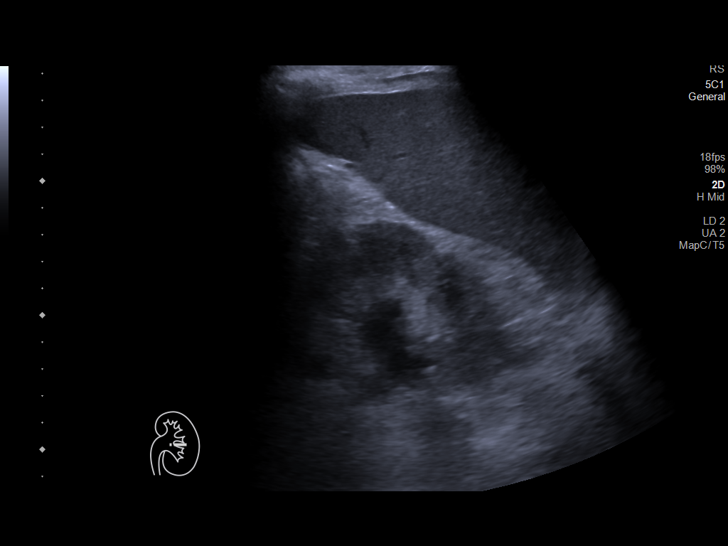
[im 19/31]
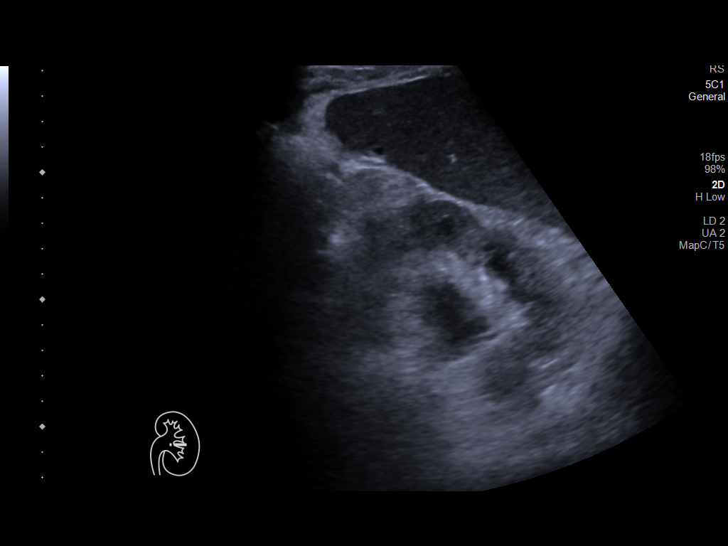
[im 21/31]
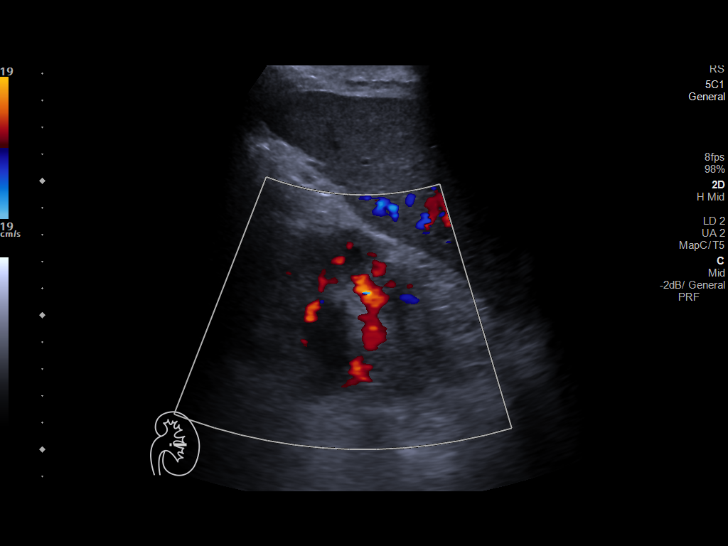
[im 23/31]
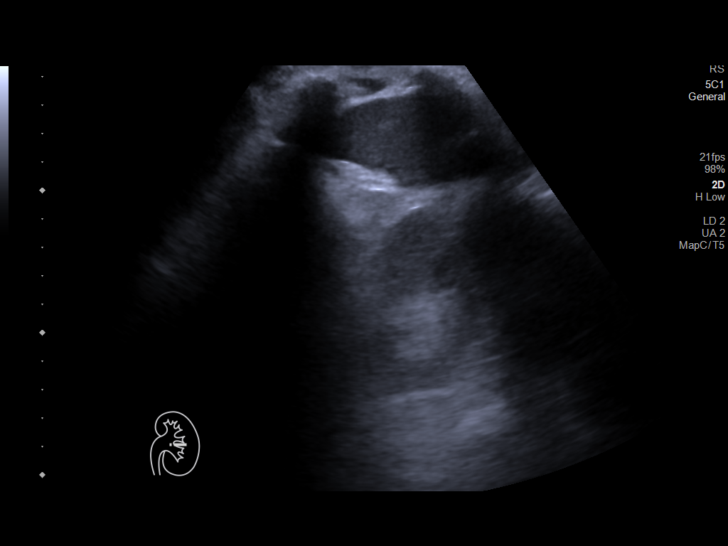
[im 26/31]
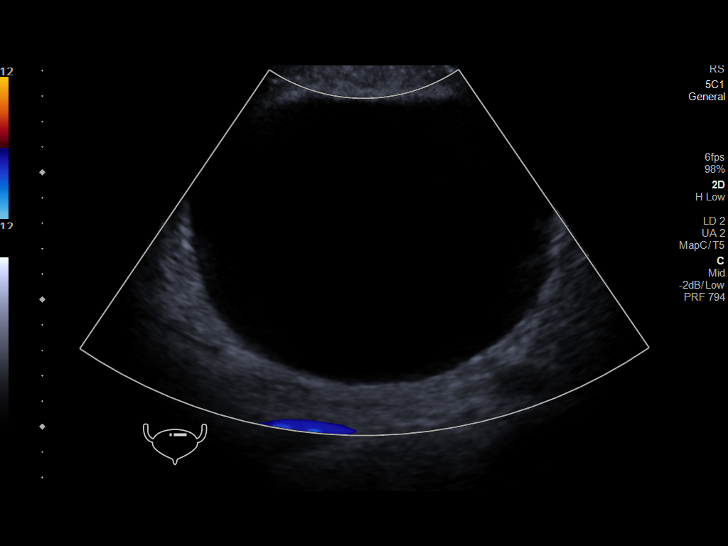
[im 28/31]
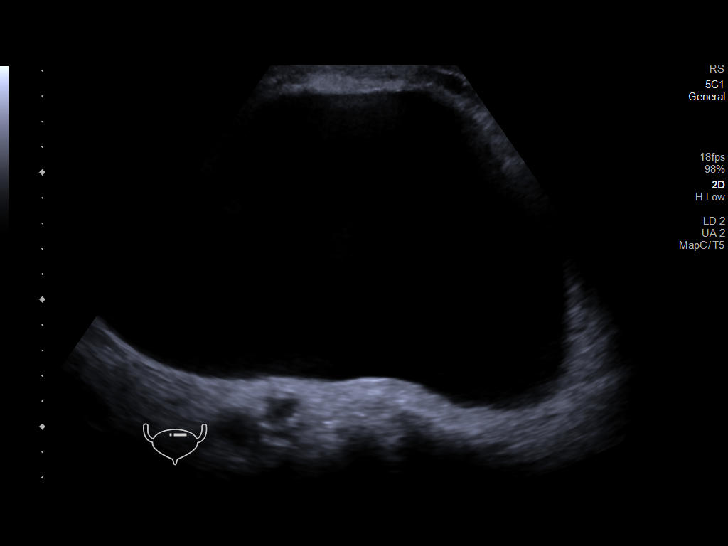
[im 31/31]
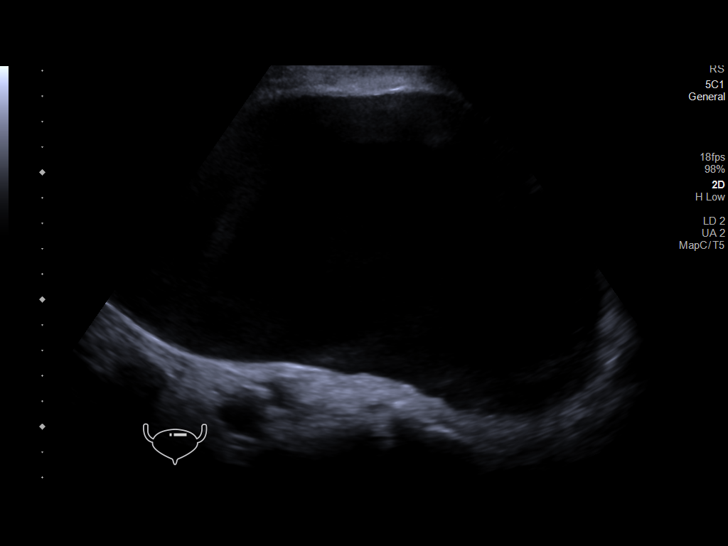

[14 of 25 positions shown; findings below may reference images not displayed]

FINDINGS: Right Kidney:

Renal measurements: Not visualized due to bowel gas.

Left Kidney:

Renal measurements: [IP_ADDRESS] cm = volume: 29.0 mL. Echogenicity
within normal limits. No mass. Mild to moderate left hydronephrosis
visualized.

Bladder:

Bladder is distended.  Patient could not void.

Other:

None.
IMPRESSION: 1. Right kidney not visualized due to overlying bowel gas. Mild to
moderate left hydronephrosis.

2.  Bladder distended patient could not void.

## 2019-07-27 MED ORDER — ENOXAPARIN SODIUM 30 MG/0.3ML ~~LOC~~ SOLN
30.0000 mg | SUBCUTANEOUS | Status: DC
  Administered 2019-07-27 – 2019-08-01 (×6): 30 mg via SUBCUTANEOUS
  Filled 2019-07-27 (×6): qty 0.3

## 2019-07-27 MED ORDER — LIDOCAINE HCL URETHRAL/MUCOSAL 2 % EX GEL
1.0000 "application " | Freq: Once | CUTANEOUS | Status: DC
  Filled 2019-07-27: qty 20

## 2019-07-27 MED ORDER — FINASTERIDE 5 MG PO TABS
5.0000 mg | ORAL_TABLET | Freq: Every day | ORAL | Status: DC
  Administered 2019-07-27 – 2019-08-02 (×7): 5 mg via ORAL
  Filled 2019-07-27 (×7): qty 1

## 2019-07-27 MED ORDER — TAMSULOSIN HCL 0.4 MG PO CAPS
0.4000 mg | ORAL_CAPSULE | Freq: Every day | ORAL | Status: DC
  Administered 2019-07-27 – 2019-08-02 (×7): 0.4 mg via ORAL
  Filled 2019-07-27 (×7): qty 1

## 2019-07-27 MED ORDER — TAMSULOSIN HCL 0.4 MG PO CAPS: 0.4000 mg | ORAL_CAPSULE | Freq: Every day | ORAL | 3 refills | Status: DC

## 2019-07-27 MED ORDER — FINASTERIDE 5 MG PO TABS: 5.0000 mg | ORAL_TABLET | Freq: Every day | ORAL | 3 refills | Status: AC

## 2019-07-27 NOTE — Progress Notes (Signed)
PROGRESS NOTE    Riley Eaton  BJY:[id number] DOB: 1941/03/06 DOA: 07/13/2019 PCP: Aletha Halim., PA-C   Brief Narrative:  Riley Eaton a 78 y.o.malewith PMH including but not limited to thoracic aortic aneurysm, CAD, chronic back pain, chronic opioid use presented to [facility] ED 12/1 with altered mental status and abd pain. He had been seen in [facility] ED 1 day prior for abd pain.   Patient had CT of the abd at the time that demonstrated cholelithiasis without cholecystitis. Surgery was consulted and recommended outpatient follow up. He was subsequently discharged home.  On 12/1 patient had worsening of pain along with AMS; therefore, came to [facility] ED. He was quite agitated per reports and received 55m dilaudid, 572mhaldol, 31m66mtivan x 3 and had minimal response.  PCCM was asked to see in consultation for consideration of precedex. Per chart review, he has no underlying hx of EtOH abuse or substance abuse. He does have hx of chronic low back pain and chronic opioid use. On review of outpatient med list, he had 5-325 norco prescribed for q6hrs PRN, 72m60mycontin TID, lyrica 200mg20m, duloxetine 60mg 70m  Per chart review, he had admission 09/24/18 through 10/06/18 for acute ascending cholangitis s/p ERCP and sphincterotomy with stenting of pancreatic duct (Dr. Karki)Therisa Doyneing that admission, he did have severe delirium felt to be due to narcotic withdrawal. He did spend a few days in ICU and was briefly treated with precedex and fentanyl. He was discharged and supposed to be evaluated by surgery as an outpatient for cholecystectomy; however, this was postponed due to the COVID [employer]. Per discharge summary, his medication regimen was adjusted prior to d/c and pt had been tolerating it well (oxycodone weaned from 80mg B66mo BID, lyrica reduced from 200mg TI59m 100mg TID20mmbalta from 60mg BID 73m0mg daily47mPatient found to have acute metabolic encephalopathy, unknown cause.   Is also noted to have acute respiratory failure and required intubation for airway protection.  Currently he is on room air.  There were complaints of abdominal pain however HIDA scan was unremarkable.  Patient also noted to have NSTEMI with elevated troponins over 2000.  Cardiology consulted, pending heart catheterization possibly later this week vs as an outpatient given his current renal failure and mental status.  Pending placement.  CIR consulted.  Assessment & Plan   Acute metabolic encephalopathy -Appears to be improving however patient continues to have intermittent episodes of confusion -?  Unknown baseline -CT head/MRI head negative -Lactate, procalcitonin, TSH, ammonia levels within normal limits -LP deferred as patient clinically has improved -Blood cultures negative to date -Patient was not on fentanyl patch at home only OxyContin.  Was placed on IV morphine here for pain to avoid withdrawal -Continues to have safety sitter at bedside  Acute kidney injury and urinary retention -Creatinine 2.92, baseline appears to be 1.7 -Hold losartan and spironolactone -Have ordered renal ultrasound, urine electrolytes -Patient was noted to have a scan with 999cc -In and out catheter was ordered however upon attempt, resistance was met.  Patient was also noted to have penile edema as well as blood, likely trauma. -Urology consulted and appreciated  Acute respiratory failure -It seems that patient was intubated for airway protection -Currently he is on room air -Sputum culture was positive for MRSA -Currently on vancomycin for total of 10 days -Patient is currently afebrile with no leukocytosis  Abdominal pain -Currently no abdominal pain today -Unclear etiology, initially concerning for cholecystitis -  tatus post ERCP with sphincterotomy-and pancreatic duct stenting on 09/25/2018 -CT abdomen shows cholelithiasis with no active disease -He was initially placed on Rocephin and Flagyl  which have been discontinued -HIDA scan shows patent cystic duct without acute cholecystitis -LFTs within normal limits  Prolonged QT -Avoid offending agents -Monitor potassium and magnesium  Elevated troponin/NSTEMI -Troponin peaked at 2110 on 07/15/2019 -EKG on 07/16/2019 showed deep T wave inversions in the inferior and anterior lateral leads -Echocardiogram 07/14/2019 shows an EF of 30 to 35%, mildly increased LV hypertrophy.  Grade 1 diastolic dysfunction. -Cardiology consulted and appreciated -Patient noted to have coronary calcification on CT scan back in February 2020 -No reports of chest pain -Patient needs cardiac catheterization however was found to not be candidate at this time due to unclear source of infection and unclear if he would be able to do DAPT without interruption.  Cardiology hoping for cardiac catheterization later this week-however discussed with Dr.Nishan, will likely proceed with cardiac catheterization as an outpatient given his mental status is not back to baseline and his creatinine is now elevated. -Cardiology recommended aspirin as well as statin, Coreg  Dilated cardiomyopathy/acute systolic heart failure -Echocardiogram as above -Continue Coreg -Prolactin and Coreg held due to AKI -Monitor intake and output, daily weights  Essential hypertension -Continue Coreg, clonidine patch -Losartan, spironolactone held -BP on the softer side  Hypokalemia -normalized, continue to monitor BMP  Dysphagia/poor oral intake -Speech therapy recommended dysphagia 1 diet -Neurology was consulted for concerns of stroke however MRI of the brain was unremarkable for stroke  Deconditioning/debility -Seen by physical therapy -Per discussion with social work, patient's wife would like inpatient rehab reconsulted  DVT Prophylaxis Lovenox  Code Status: Full  Family Communication: None at bedside  Disposition Plan: Admitted.  Patient now with urinary retention and  acute kidney injury.  Disposition TBD  Consultants PCCM Cardiology Neurology Inpatient rehab Urology  Procedures  Intubation/extubation Echocardiogram HIDA scan  Antibiotics   Anti-infectives (From admission, onward)   Start     Dose/Rate Route Frequency Ordered Stop   07/21/19 0100  vancomycin (VANCOCIN) 1,500 mg in sodium chloride 0.9 % 500 mL IVPB     1,500 mg 250 mL/hr over 120 Minutes Intravenous Every 12 hours 07/20/19 1608 07/26/19 1437   07/20/19 1018  cefTRIAXone (ROCEPHIN) 2 g in sodium chloride 0.9 % 100 mL IVPB  Status:  Discontinued     2 g 200 mL/hr over 30 Minutes Intravenous Every 24 hours 07/19/19 1115 07/20/19 0800   07/20/19 1018  cefTRIAXone (ROCEPHIN) 2 g in sodium chloride 0.9 % 100 mL IVPB  Status:  Discontinued     2 g 200 mL/hr over 30 Minutes Intravenous Every 24 hours 07/20/19 0800 07/20/19 0921   07/17/19 0100  vancomycin (VANCOCIN) IVPB 750 mg/150 ml premix  Status:  Discontinued     750 mg 150 mL/hr over 60 Minutes Intravenous Every 12 hours 07/16/19 1240 07/20/19 1608   07/16/19 1400  acyclovir (ZOVIRAX) 700 mg in dextrose 5 % 100 mL IVPB  Status:  Discontinued     10 mg/kg  69.9 kg 114 mL/hr over 60 Minutes Intravenous Every 8 hours 07/16/19 1240 07/19/19 1115   07/16/19 1245  cefTRIAXone (ROCEPHIN) 2 g in sodium chloride 0.9 % 100 mL IVPB  Status:  Discontinued     2 g 200 mL/hr over 30 Minutes Intravenous Every 12 hours 07/16/19 1240 07/19/19 1115   07/16/19 1245  vancomycin (VANCOCIN) 1,500 mg in sodium chloride 0.9 % 500 mL  IVPB     1,500 mg 250 mL/hr over 120 Minutes Intravenous  Once 07/16/19 1240 07/16/19 1609   07/16/19 1245  ampicillin (OMNIPEN) 2 g in sodium chloride 0.9 % 100 mL IVPB  Status:  Discontinued     2 g 300 mL/hr over 20 Minutes Intravenous Every 4 hours 07/16/19 1240 07/19/19 1115   07/13/19 1900  cefTRIAXone (ROCEPHIN) 1 g in sodium chloride 0.9 % 100 mL IVPB  Status:  Discontinued     1 g 200 mL/hr over 30 Minutes  Intravenous Every 24 hours 07/13/19 1825 07/16/19 1240   07/13/19 1900  metroNIDAZOLE (FLAGYL) IVPB 500 mg  Status:  Discontinued     500 mg 100 mL/hr over 60 Minutes Intravenous Every 8 hours 07/13/19 1825 07/20/19 1583      Subjective:   Riley Eaton seen and examined today.  Patient does complain of pain in his penis.  Otherwise he has no complaints today.  Denies chest pain or shortness of breath, dizziness or headache, abdominal pain. Objective:   Vitals:   07/26/19 1930 07/27/19 0002 07/27/19 0338 07/27/19 0800  BP: 111/62 135/86 (!) 134/91 (!) 139/91  Pulse: 73 76 75 87  Resp: 11 19 (!) 25 20  Temp: 98 F (36.7 C) (!) 97.3 F (36.3 C) (!) 97.4 F (36.3 C) 97.7 F (36.5 C)  TempSrc: Axillary Oral Oral Oral  SpO2: 95% 99% 99% 98%  Weight:   74.1 kg   Height:        Intake/Output Summary (Last 24 hours) at 07/27/2019 1052 Last data filed at 07/27/2019 1000 Gross per 24 hour  Intake 1016 ml  Output 50 ml  Net 966 ml   Filed Weights   07/25/19 0105 07/26/19 0356 07/27/19 0338  Weight: 70.1 kg 74.9 kg 74.1 kg   Exam  General: Well developed, ill-appearing, NAD  HEENT: NCAT, mucous membranes moist.   Cardiovascular: S1 S2 auscultated, RRR, 2/6 SEM  Respiratory: Clear to auscultation bilaterally with equal chest rise  Abdomen: Soft, nontender, nondistended, + bowel sounds  Genitourinary: Penile edema, scrotum appears normal.  Blood noted from trauma likely secondary to insertion attempt of Foley catheter.  Extremities: warm dry without cyanosis clubbing or edema  Neuro: AAOx2 (self, place), no new neurological deficits  Psych: Normal affect and demeanor with intact judgement and insight  Data Reviewed: I have personally reviewed following labs and imaging studies  CBC: Recent Labs  Lab 07/21/19 0049 07/22/19 0348 07/23/19 0554 07/24/19 0336 07/27/19 0444  WBC 8.8 8.2 6.8 13.6* 10.4  HGB 13.5 13.6 12.8* 14.2 10.9*  HCT 37.0* 38.2* 35.6* 39.4  30.8*  MCV 93.7 96.0 93.7 94.0 97.8  PLT 188 227 216 329 094   Basic Metabolic Panel: Recent Labs  Lab 07/21/19 0049 07/22/19 0348 07/23/19 0554 07/24/19 0336 07/27/19 0444  NA 139 144 140 142 138  K 3.3* 2.9* 2.8* 3.5 4.4  CL 105 109 107 108 106  CO2 23 26 21* 23 22  GLUCOSE 127* 137* 130* 166* 119*  BUN 17 20 13 15  38*  CREATININE 0.55* 0.70 0.63 0.74 2.92*  CALCIUM 8.4* 8.6* 8.5* 9.0 8.3*  MG 1.8 1.9 1.8 2.0 2.2  PHOS 3.1 2.5 2.5  --   --    GFR: Estimated Creatinine Clearance: 19.2 mL/min (A) (by C-G formula based on SCr of 2.92 mg/dL (H)). Liver Function Tests: Recent Labs  Lab 07/22/19 0348 07/23/19 0554 07/24/19 0336  AST 22 24 26   ALT 23 21 24  ALKPHOS 58 56 66  BILITOT 1.0 1.3* 1.5*  PROT 5.5* 5.5* 6.1*  ALBUMIN 3.2* 3.1* 3.5   No results for input(s): LIPASE, AMYLASE in the last 168 hours. No results for input(s): AMMONIA in the last 168 hours. Coagulation Profile: No results for input(s): INR, PROTIME in the last 168 hours. Cardiac Enzymes: No results for input(s): CKTOTAL, CKMB, CKMBINDEX, TROPONINI in the last 168 hours. BNP (last 3 results) No results for input(s): PROBNP in the last 8760 hours. HbA1C: No results for input(s): HGBA1C in the last 72 hours. CBG: Recent Labs  Lab 07/26/19 1648 07/26/19 1927 07/27/19 0001 07/27/19 0335 07/27/19 0739  GLUCAP 102* 97 100* 95 93   Lipid Profile: No results for input(s): CHOL, HDL, LDLCALC, TRIG, CHOLHDL, LDLDIRECT in the last 72 hours. Thyroid Function Tests: No results for input(s): TSH, T4TOTAL, FREET4, T3FREE, THYROIDAB in the last 72 hours. Anemia Panel: No results for input(s): VITAMINB12, FOLATE, FERRITIN, TIBC, IRON, RETICCTPCT in the last 72 hours. Urine analysis:    Component Value Date/Time   COLORURINE YELLOW 07/14/2019 0410   APPEARANCEUR CLEAR 07/14/2019 0410   LABSPEC 1.021 07/14/2019 0410   PHURINE 5.0 07/14/2019 0410   GLUCOSEU NEGATIVE 07/14/2019 0410   HGBUR MODERATE (A)  07/14/2019 0410   BILIRUBINUR NEGATIVE 07/14/2019 0410   KETONESUR 20 (A) 07/14/2019 0410   PROTEINUR 30 (A) 07/14/2019 0410   NITRITE NEGATIVE 07/14/2019 0410   LEUKOCYTESUR NEGATIVE 07/14/2019 0410   Sepsis Labs: @LABRCNTIP (procalcitonin:4,lacticidven:4)  ) Recent Results (from the past 240 hour(s))  Culture, Urine     Status: None   Collection Time: 07/18/19  8:13 AM   Specimen: Urine, Catheterized  Result Value Ref Range Status   Specimen Description URINE, CATHETERIZED  Final   Special Requests NONE  Final   Culture   Final    NO GROWTH Performed at [facility], [address], [place], [place] [address]    Report Status 07/19/2019 FINAL  Final  Culture, respiratory (non-expectorated)     Status: None   Collection Time: 07/18/19  8:13 AM   Specimen: Tracheal Aspirate; Respiratory  Result Value Ref Range Status   Specimen Description TRACHEAL ASPIRATE  Final   Special Requests NONE  Final   Gram Stain   Final    FEW WBC PRESENT, PREDOMINANTLY PMN RARE GRAM POSITIVE COCCI IN CLUSTERS Performed at [facility], [place] [address] [place], [place] [address]    Culture   Final    FEW STAPHYLOCOCCUS AUREUS RARE STENOTROPHOMONAS MALTOPHILIA    Report Status 07/21/2019 FINAL  Final   Organism ID, Bacteria STAPHYLOCOCCUS AUREUS  Final   Organism ID, Bacteria STENOTROPHOMONAS MALTOPHILIA  Final      Susceptibility   Staphylococcus aureus - MIC*    CIPROFLOXACIN <=0.5 SENSITIVE Sensitive     ERYTHROMYCIN RESISTANT Resistant     GENTAMICIN <=0.5 SENSITIVE Sensitive     OXACILLIN >=4 RESISTANT Resistant     TETRACYCLINE <=1 SENSITIVE Sensitive     VANCOMYCIN 1 SENSITIVE Sensitive     TRIMETH/SULFA <=10 SENSITIVE Sensitive     CLINDAMYCIN RESISTANT Resistant     RIFAMPIN <=0.5 SENSITIVE Sensitive     Inducible Clindamycin POSITIVE Resistant     * FEW STAPHYLOCOCCUS AUREUS   Stenotrophomonas maltophilia - MIC*    LEVOFLOXACIN 4 INTERMEDIATE Intermediate      TRIMETH/SULFA <=20 SENSITIVE Sensitive     * RARE STENOTROPHOMONAS MALTOPHILIA  Culture, blood (routine x 2)     Status: None  Collection Time: 07/18/19  9:24 AM   Specimen: BLOOD  Result Value Ref Range Status   Specimen Description BLOOD LEFT THUMB  Final   Special Requests   Final    BOTTLES DRAWN AEROBIC AND ANAEROBIC Blood Culture adequate volume   Culture   Final    NO GROWTH 5 DAYS Performed at [facility], [address], [place], [place] [address]    Report Status 07/23/2019 FINAL  Final  Culture, blood (routine x 2)     Status: None   Collection Time: 07/18/19  9:30 AM   Specimen: BLOOD RIGHT HAND  Result Value Ref Range Status   Specimen Description BLOOD RIGHT HAND  Final   Special Requests   Final    BOTTLES DRAWN AEROBIC AND ANAEROBIC Blood Culture adequate volume   Culture   Final    NO GROWTH 5 DAYS Performed at [facility], [place] [address], [place], [place] [address]    Report Status 07/23/2019 FINAL  Final  MRSA PCR Screening     Status: Abnormal   Collection Time: 07/19/19  9:01 AM   Specimen: Nasal Mucosa; Nasopharyngeal  Result Value Ref Range Status   MRSA by PCR POSITIVE (A) NEGATIVE Final    Comment:        The GeneXpert MRSA Assay (FDA approved for NASAL specimens only), is one component of a comprehensive MRSA colonization surveillance program. It is not intended to diagnose MRSA infection nor to guide or monitor treatment for MRSA infections. RESULT CALLED TO, READ BACK BY AND VERIFIED WITH: Lattie Haw RN 11:10 07/19/19 ([email or web address]) Performed at [facility], [place] [address] [place], [place] [address]       Radiology Studies: DG Abd 1 View  Result Date: 07/25/2019 CLINICAL DATA:  Generalized abdominal pain. EXAM: ABDOMEN - 1 VIEW COMPARISON:  July 16, 2019. FINDINGS: No abnormal bowel dilatation is noted. Large amount of stool seen throughout the colon. No abnormal calcifications are noted. IMPRESSION: Large stool burden is  noted. No evidence of bowel obstruction or ileus. Electronically Signed   By: Marijo Conception M.D.   On: 07/25/2019 13:30   US RENAL  Result Date: 07/27/2019 CLINICAL DATA:  Acute renal injury. EXAM: RENAL / URINARY TRACT ULTRASOUND COMPLETE COMPARISON:  CT 07/18/2019. FINDINGS: Right Kidney: Renal measurements: Not visualized due to bowel gas. Left Kidney: Renal measurements: [phone] cm = volume: 29.0 mL. Echogenicity within normal limits. No mass. Mild to moderate left hydronephrosis visualized. Bladder: Bladder is distended.  Patient could not void. Other: None. IMPRESSION: 1. Right kidney not visualized due to overlying bowel gas. Mild to moderate left hydronephrosis. 2.  Bladder distended patient could not void. Electronically Signed   By: Tanaina   On: 07/27/2019 09:01   DG Swallowing Func-Speech Pathology  Result Date: 07/26/2019 Objective Swallowing Evaluation: Type of Study: MBS-Modified Barium Swallow Study  Patient Details Name: Riley Eaton MRN: [id number] Date of Birth: [birth year] Today's Date: 07/26/2019 Time: SLP Start Time (ACUTE ONLY): 0940 -SLP Stop Time (ACUTE ONLY): 1010 SLP Time Calculation (min) (ACUTE ONLY): 30 min Past Medical History: Past Medical History: Diagnosis Date . Chronic back pain  . Chronic low back pain 05/19/2017 . Chronic, continuous use of opioids   for back pain . Coronary artery calcification seen on CT scan  . Nerve pain  . Thoracic aortic aneurysm [facility])  Past Surgical History: Past Surgical History: Procedure Laterality Date . BACK SURGERY    09-2016 . back surgey   .  ENDOSCOPIC RETROGRADE CHOLANGIOPANCREATOGRAPHY (ERCP) WITH PROPOFOL N/A 09/25/2018  Procedure: ENDOSCOPIC RETROGRADE CHOLANGIOPANCREATOGRAPHY (ERCP) WITH PROPOFOL;  Surgeon: Ronnette Juniper, MD;  Location: [facility];  Service: Gastroenterology;  Laterality: N/A; . PANCREATIC STENT PLACEMENT  09/25/2018  Procedure: PANCREATIC STENT PLACEMENT;  Surgeon: Ronnette Juniper, MD;  Location: [facility]  ENDOSCOPY;  Service: Gastroenterology;; . REMOVAL OF STONES  09/25/2018  Procedure: REMOVAL OF STONES;  Surgeon: Ronnette Juniper, MD;  Location: [facility] ENDOSCOPY;  Service: Gastroenterology;; . [name]  09/25/2018  Procedure: SPHINCTEROTOMY;  Surgeon: Ronnette Juniper, MD;  Location: [facility] ENDOSCOPY;  Service: Gastroenterology;; HPI: Riley Eaton is a 78 y.o. male who presented to ED 12/1 with abd pain and AMS.  Had been seen 1 day earlier and CT abd / pelv showed cholelithiasis without cholecystitis. He does have hx of chronic low back pain and chronic opioid use. Intubated 12/4-12/7  Subjective: Pt was seen in radiology for repeat MBS to determine appropriateness for advanced consistencies. Assessment / Plan / Recommendation CHL IP CLINICAL IMPRESSIONS 07/26/2019 Clinical Impression Pt demonstrates improvement in oropharyngeal swallow when compared to MBS completed 07/21/2019. Pt cognitive impairment continues to exacerbate sensory and motor issues. Pt requires cues for small bites/sips and slow rate. Orally, pt exhibited tolerance of all consistencies. Extended oral prep of solid texture was noted, as well as missing dentition. Pharyngeally, pt exhibits delayed swallow across consistencies, with trigger at the level of the pyriform sinus on thin and nectar thick liquids, and at the vallecula on puree and solid textures. Deep flash penetration to the level of the vocal folds was seen during the swallow of thin liquids via cup, with no cough response. No penetration or aspiration seen on nectar thick liquid, puree, or solid consistencies. Will advance diet to dys 2 with nectar thick liquids, crushed meds, full supervision during po intake for assistance with self feeding. Safe swallow precautions were updated and sent with transport to post at head of the bed. SLP will continue to follow to provide education and assess diet tolerance.  SLP Visit Diagnosis Dysphagia, oropharyngeal phase (R13.12)     Impact on safety and  function Moderate aspiration risk   CHL IP TREATMENT RECOMMENDATION 07/26/2019 Treatment Recommendations Therapy as outlined in treatment plan below   Prognosis 07/26/2019 Prognosis for Safe Diet Advancement Fair Barriers to Reach Goals Cognitive deficits   CHL IP DIET RECOMMENDATION 07/26/2019 SLP Diet Recommendations Dysphagia 2 (Fine chop) solids;Nectar thick liquid Liquid Administration via Cup;Straw Medication Administration Crushed with puree Compensations Slow rate;Small sips/bites Postural Changes Remain semi-upright after after feeds/meals (Comment);Seated upright at 90 degrees   CHL IP OTHER RECOMMENDATIONS 07/26/2019   Oral Care Recommendations Oral care QID Other Recommendations Order thickener from pharmacy;Have oral suction available   CHL IP FOLLOW UP RECOMMENDATIONS 07/26/2019 Follow up Recommendations Skilled Nursing facility;24 hour supervision/assistance   CHL IP FREQUENCY AND DURATION 07/26/2019 Speech Therapy Frequency (ACUTE ONLY) min 2x/week Treatment Duration 2 weeks      CHL IP ORAL PHASE 07/26/2019 Oral Phase Impaired   Oral - Mech Soft Impaired mastication;Delayed oral transit    CHL IP PHARYNGEAL PHASE 07/26/2019 Pharyngeal Phase Impaired   Pharyngeal- Nectar Teaspoon Delayed swallow initiation-pyriform sinuses   Pharyngeal- Thin Cup Delayed swallow initiation-pyriform sinuses;Reduced airway/laryngeal closure;Penetration/Aspiration during swallow Pharyngeal Material enters airway, CONTACTS cords and not ejected out   Pharyngeal- Puree Delayed swallow initiation-vallecula   Pharyngeal- Mechanical Soft Delayed swallow initiation-vallecula  CHL IP CERVICAL ESOPHAGEAL PHASE 07/26/2019 Cervical Esophageal Phase Darryll Capers Enriqueta Shutter, MSP, CCC-SLP Speech Language Pathologist Office: [phone] Pager:  [phone] Shonna Chock 07/26/2019, 10:28 AM                Scheduled Meds: . aspirin  81 mg Oral Daily  . atorvastatin  40 mg Oral q1800  . bisacodyl  5 mg Oral BID  . carvedilol  12.5 mg  Oral BID WC  . Chlorhexidine Gluconate Cloth  6 each Topical Daily  . cloNIDine  0.1 mg Transdermal Weekly  . enoxaparin (LOVENOX) injection  40 mg Subcutaneous Q24H  . mouth rinse  15 mL Mouth Rinse BID  . pantoprazole  40 mg Oral QHS  . polyethylene glycol  17 g Oral Daily   Continuous Infusions:    LOS: 14 days   Time Spent in minutes   45 minutes  Osha Rane D.O. on 07/27/2019 at 10:52 AM  Between 7am to 7pm - Please see pager noted on [email or web address]  After 7pm go to [email or web address]  And look for the night coverage person covering for me after hours  [facility] Office  [phone]

## 2019-07-27 NOTE — Progress Notes (Addendum)
Primary Cardiologist:  Gwenlyn Found  Subjective:  Oriented to name, wants to get a glass of water and a straw  Objective:  Vitals:   07/26/19 1537 07/26/19 1930 07/27/19 0002 07/27/19 0338  BP: 136/85 111/62 135/86 (!) 134/91  Pulse: 69 73 76 75  Resp: (!) 21 11 19  (!) 25  Temp: 98 F (36.7 C) 98 F (36.7 C) (!) 97.3 F (36.3 C) (!) 97.4 F (36.3 C)  TempSrc: Oral Axillary Oral Oral  SpO2:  95% 99% 99%  Weight:    74.1 kg  Height:        Intake/Output from previous day:  Intake/Output Summary (Last 24 hours) at 07/27/2019 0815 Last data filed at 07/27/2019 0442 Gross per 24 hour  Intake 780 ml  Output 50 ml  Net 730 ml    Physical Exam: General: Well developed, elderly, male in no acute distress Head: Eyes PERRLA, Head normocephalic and atraumatic Lungs: decreased BS bases bilaterally to auscultation. Heart: HRRR S1 S2, without rub or gallop. 2/6 murmur. 4/4 extremity pulses are 2+ & equal. JVD 10 cm Abdomen: Bowel sounds are present, abdomen soft and non-tender without masses or  hernias noted. Msk: Normal strength and tone for age. Extremities: No clubbing, cyanosis or edema.    Skin:  No rashes or lesions noted. Neuro: Alert and oriented X 3. Psych:  Good affect, responds appropriately  Lab Results: Basic Metabolic Panel: Recent Labs    07/27/19 0444  NA 138  K 4.4  CL 106  CO2 22  GLUCOSE 119*  BUN 38*  CREATININE 2.92*  CALCIUM 8.3*  MG 2.2   Liver Function Tests: No results for input(s): AST, ALT, ALKPHOS, BILITOT, PROT, ALBUMIN in the last 72 hours. No results for input(s): LIPASE, AMYLASE in the last 72 hours. CBC: Recent Labs    07/27/19 0444  WBC 10.4  HGB 10.9*  HCT 30.8*  MCV 97.8  PLT 190    Imaging: DG Abd 1 View  Result Date: 07/25/2019 CLINICAL DATA:  Generalized abdominal pain. EXAM: ABDOMEN - 1 VIEW COMPARISON:  July 16, 2019. FINDINGS: No abnormal bowel dilatation is noted. Large amount of stool seen throughout the colon.  No abnormal calcifications are noted. IMPRESSION: Large stool burden is noted. No evidence of bowel obstruction or ileus. Electronically Signed   By: Marijo Conception M.D.   On: 07/25/2019 13:30   DG Swallowing Func-Speech Pathology  Result Date: 07/26/2019 Objective Swallowing Evaluation: Type of Study: MBS-Modified Barium Swallow Study  Patient Details Name: DREXEL VELARDO MRN: [id number] Date of Birth: 07-02-1941 Today's Date: 07/26/2019 Time: SLP Start Time (ACUTE ONLY): 0940 -SLP Stop Time (ACUTE ONLY): 1010 SLP Time Calculation (min) (ACUTE ONLY): 30 min Past Medical History: Past Medical History: Diagnosis Date  Chronic back pain   Chronic low back pain 05/19/2017  Chronic, continuous use of opioids   for back pain  Coronary artery calcification seen on CT scan   Nerve pain   Thoracic aortic aneurysm [facility])  Past Surgical History: Past Surgical History: Procedure Laterality Date  BACK SURGERY    09-2016  back surgey    ENDOSCOPIC RETROGRADE CHOLANGIOPANCREATOGRAPHY (ERCP) WITH PROPOFOL N/A 09/25/2018  Procedure: ENDOSCOPIC RETROGRADE CHOLANGIOPANCREATOGRAPHY (ERCP) WITH PROPOFOL;  Surgeon: Ronnette Juniper, MD;  Location: [place];  Service: Gastroenterology;  Laterality: N/A;  PANCREATIC STENT PLACEMENT  09/25/2018  Procedure: PANCREATIC STENT PLACEMENT;  Surgeon: Ronnette Juniper, MD;  Location: [place];  Service: Gastroenterology;;  REMOVAL OF STONES  09/25/2018  Procedure: REMOVAL OF  STONES;  Surgeon: Ronnette Juniper, MD;  Location: [facility] ENDOSCOPY;  Service: Gastroenterology;;  [name]  09/25/2018  Procedure: [name];  Surgeon: Ronnette Juniper, MD;  Location: [facility] ENDOSCOPY;  Service: Gastroenterology;; HPI: WYNTON ELSTAD is a 78 y.o. male who presented to ED 12/1 with abd pain and AMS.  Had been seen 1 day earlier and CT abd / pelv showed cholelithiasis without cholecystitis. He does have hx of chronic low back pain and chronic opioid use. Intubated 12/4-12/7  Subjective: Pt was seen in radiology for  repeat MBS to determine appropriateness for advanced consistencies. Assessment / Plan / Recommendation CHL IP CLINICAL IMPRESSIONS 07/26/2019 Clinical Impression Pt demonstrates improvement in oropharyngeal swallow when compared to MBS completed 07/21/2019. Pt cognitive impairment continues to exacerbate sensory and motor issues. Pt requires cues for small bites/sips and slow rate. Orally, pt exhibited tolerance of all consistencies. Extended oral prep of solid texture was noted, as well as missing dentition. Pharyngeally, pt exhibits delayed swallow across consistencies, with trigger at the level of the pyriform sinus on thin and nectar thick liquids, and at the vallecula on puree and solid textures. Deep flash penetration to the level of the vocal folds was seen during the swallow of thin liquids via cup, with no cough response. No penetration or aspiration seen on nectar thick liquid, puree, or solid consistencies. Will advance diet to dys 2 with nectar thick liquids, crushed meds, full supervision during po intake for assistance with self feeding. Safe swallow precautions were updated and sent with transport to post at head of the bed. SLP will continue to follow to provide education and assess diet tolerance.  SLP Visit Diagnosis Dysphagia, oropharyngeal phase (R13.12)     Impact on safety and function Moderate aspiration risk   CHL IP TREATMENT RECOMMENDATION 07/26/2019 Treatment Recommendations Therapy as outlined in treatment plan below   Prognosis 07/26/2019 Prognosis for Safe Diet Advancement Fair Barriers to Reach Goals Cognitive deficits   CHL IP DIET RECOMMENDATION 07/26/2019 SLP Diet Recommendations Dysphagia 2 (Fine chop) solids;Nectar thick liquid Liquid Administration via Cup;Straw Medication Administration Crushed with puree Compensations Slow rate;Small sips/bites Postural Changes Remain semi-upright after after feeds/meals (Comment);Seated upright at 90 degrees   CHL IP OTHER RECOMMENDATIONS  07/26/2019   Oral Care Recommendations Oral care QID Other Recommendations Order thickener from pharmacy;Have oral suction available   CHL IP FOLLOW UP RECOMMENDATIONS 07/26/2019 Follow up Recommendations Skilled Nursing facility;24 hour supervision/assistance   CHL IP FREQUENCY AND DURATION 07/26/2019 Speech Therapy Frequency (ACUTE ONLY) min 2x/week Treatment Duration 2 weeks      CHL IP ORAL PHASE 07/26/2019 Oral Phase Impaired   Oral - Mech Soft Impaired mastication;Delayed oral transit    CHL IP PHARYNGEAL PHASE 07/26/2019 Pharyngeal Phase Impaired   Pharyngeal- Nectar Teaspoon Delayed swallow initiation-pyriform sinuses   Pharyngeal- Thin Cup Delayed swallow initiation-pyriform sinuses;Reduced airway/laryngeal closure;Penetration/Aspiration during swallow Pharyngeal Material enters airway, CONTACTS cords and not ejected out   Pharyngeal- Puree Delayed swallow initiation-vallecula   Pharyngeal- Mechanical Soft Delayed swallow initiation-vallecula  CHL IP CERVICAL ESOPHAGEAL PHASE 07/26/2019 Cervical Esophageal Phase [facility] Enriqueta Shutter, MSP, CCC-SLP Speech Language Pathologist Office: [phone] Pager: [phone] Shonna Chock 07/26/2019, 10:28 AM               Cardiac Studies:  ECG: SR rate 100 lateral T wave inversions    Telemetry: SR PaC  Echo: EF 30-35% 07/14/19   1. Technically difficult study. Left ventricular ejection fraction, by visual estimation, is 30 to 35%. The left ventricle has  grossly severely decreased function. Images are not sufficient to assess for regional wall motion abnormalities. There is  mildly increased left ventricular hypertrophy.  2. Left ventricular diastolic parameters are consistent with Grade I diastolic dysfunction (impaired relaxation).  3. Global right ventricle has normal systolic function.The right ventricular size is normal.  4. The mitral valve is normal in structure. No evidence of mitral valve regurgitation.  5. The tricuspid valve is not well  visualized. Tricuspid valve regurgitation is not demonstrated.  6. The aortic valve was not well visualized. Aortic valve regurgitation is not visualized.  7. The pulmonic valve was not well visualized. Pulmonic valve regurgitation is not visualized.    Medications:    aspirin  81 mg Oral Daily   atorvastatin  40 mg Oral q1800   bisacodyl  5 mg Oral BID   carvedilol  12.5 mg Oral BID WC   Chlorhexidine Gluconate Cloth  6 each Topical Daily   cloNIDine  0.1 mg Transdermal Weekly   enoxaparin (LOVENOX) injection  40 mg Subcutaneous Q24H   mouth rinse  15 mL Mouth Rinse BID   pantoprazole  40 mg Oral QHS   polyethylene glycol  17 g Oral Daily    Patient Profile:     78 y.o. male with PMH including but not limited to thoracic aortic aneurysm, CAD, chronic back pain, chronic opioid use  presented to [facility] ED 12/1 with altered mental status and abd pain. SEMI by Noe Gens, EF now 30-35%  Assessment/Plan:   1. SEMI:  - pt w/ lat ECG changes, EF 30-35% - no anginal sx, med rx for now due to elevated creatinine and decreased mental status - some JVD, not on diuretic pta, wt trending up, I/O +5.6 L since admit - discuss IV Lasix w/ MD - BMET and CBC are markedly different from 12/11, recheck  2. DCM:  - felt ischemic - aldactone and losartan started this admit>>hold for now - continue Coreg  3. Acute resp failure, acute metabolic encephalopathy - per IM, improved, but mental status not back to baseline   Rhonda Barrett 07/27/2019, 8:15 AM  No cardiac complaints still with MS changes marked change in Hct and Cr this am being repeated holding aldactone and losartan Does not appear to be a candidate for cardiac cath. Will consider outpatient risk stratification with [facility] as outpatient   Jenkins Rouge MD [facility]

## 2019-07-27 NOTE — Progress Notes (Signed)
16Fr, 10cc balloon coude catheter inserted, urine return. Urine sample collected. Output 1800cc. No s/s of distress. No complications noted at this time.  Audie Clear, LPN

## 2019-07-27 NOTE — Progress Notes (Signed)
Report from nightshift RN stated that patient had 999cc in bladder per bladder scan at [id number].    Dr. Ree Kida notified.  Verbal order received for in and out cath x 1.  Upon assessment patient's penis was edematous.  Peri care preformed.    Attempted in and out cath x1 with Ailene Rud, RN.  Attempt was unsuccessful, resistance met.  Upon removal of catheter dark substance in catheter tip.    Dr Ree Kida notified of swelling, she came to assess patient and bleeding noted at this time.

## 2019-07-27 NOTE — Consult Note (Signed)
Reason for Consult:Urinary Retention / Enlarged Prostate, Bulbar Urethral Stricture, Acute Renal Failure  Referring Physician: Cristal Ford DO.   Riley Eaton is an 78 y.o. male.   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 ON tamsulosin at baseline. DRE 07/2019 60gm. Had similar even earlier this year while hospitalzied. H/o muliple back surgeries and minimally ambulatory. 16F coude easily placed by catheter team with 1.8L output.   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 Jeffie Pollock of stricture per history.   3 - Acute Renal Failure - Cr 2.9 up from <1 during episode urinary retention this admission. CT 07/2019 no hydro / masses / stones.   PMH sig for frailty / failure to thrive, chronic lumbagao / back surgeries, / chornic narcotics. His PCP is Emmie Niemann. He lived independantly with wife at baseline.  Today " Riley Eaton" is seen in consultation for above. He now has foley in place by catheter team.    Past Medical History:  Diagnosis Date  . Chronic back pain   . 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 Chronic, continuous use of opioids    for back pain  . Coronary artery calcification seen on CT scan   . Nerve pain   . Thoracic aortic aneurysm [facility])     Past Surgical History:  Procedure Laterality Date  . BACK SURGERY     09-2016  . back surgey    . ENDOSCOPIC RETROGRADE CHOLANGIOPANCREATOGRAPHY (ERCP) WITH PROPOFOL N/A 09/25/2018   Procedure: ENDOSCOPIC RETROGRADE CHOLANGIOPANCREATOGRAPHY (ERCP) WITH PROPOFOL;  Surgeon: Ronnette Juniper, MD;  Location: [place];  Service: Gastroenterology;  Laterality: N/A;  . PANCREATIC STENT PLACEMENT  09/25/2018   Procedure: PANCREATIC STENT PLACEMENT;  Surgeon: Ronnette Juniper, MD;  Location: [place];  Service: Gastroenterology;;  . REMOVAL OF STONES  09/25/2018   Procedure: REMOVAL OF STONES;  Surgeon: Ronnette Juniper, MD;  Location: [place];  Service: Gastroenterology;;  . [name]  09/25/2018   Procedure: [name];  Surgeon: Ronnette Juniper, MD;  Location: [facility] ENDOSCOPY;  Service: Gastroenterology;;    Family History  Problem Relation Age of Onset  . Cancer Mother   . Heart attack Father     Social History:  reports that he has never smoked. He has never used smokeless tobacco. He reports that he does not drink alcohol or use drugs.  Allergies: No Known Allergies  Medications: I have reviewed the patient's current medications.  Results for orders placed or performed during the hospital encounter of 07/13/19 (from the past 48 hour(s))  Glucose, capillary     Status: Abnormal   Collection Time: 07/25/19 11:58 AM  Result Value Ref Range   Glucose-Capillary 129 (H) 70 - 99 mg/dL  Glucose, capillary     Status: Abnormal   Collection Time: 07/25/19  3:59 PM  Result Value Ref Range   Glucose-Capillary 110 (H) 70 - 99 mg/dL  Glucose, capillary     Status: Abnormal   Collection Time: 07/25/19  8:04 PM  Result Value Ref Range   Glucose-Capillary 123 (H) 70 - 99 mg/dL  Glucose, capillary     Status: Abnormal   Collection Time: 07/26/19 12:14 AM  Result Value Ref Range   Glucose-Capillary 120 (H) 70 - 99 mg/dL  Glucose, capillary     Status: Abnormal   Collection Time: 07/26/19  3:58 AM  Result Value Ref Range   Glucose-Capillary 117 (H) 70 - 99  mg/dL  Glucose, capillary     Status: None   Collection Time: 07/26/19  7:58 AM  Result Value Ref Range   Glucose-Capillary 93 70 - 99 mg/dL  Glucose, capillary     Status: Abnormal   Collection Time: 07/26/19 11:40 AM  Result Value Ref Range   Glucose-Capillary 112 (H) 70 - 99 mg/dL  Glucose, capillary     Status: Abnormal   Collection Time: 07/26/19  4:48 PM  Result Value Ref Range   Glucose-Capillary 102 (H) 70 - 99 mg/dL  Glucose, capillary     Status: None   Collection Time: 07/26/19  7:27 PM  Result Value Ref Range   Glucose-Capillary 97 70 - 99  mg/dL  Glucose, capillary     Status: Abnormal   Collection Time: 07/27/19 12:01 AM  Result Value Ref Range   Glucose-Capillary 100 (H) 70 - 99 mg/dL  Glucose, capillary     Status: None   Collection Time: 07/27/19  3:35 AM  Result Value Ref Range   Glucose-Capillary 95 70 - 99 mg/dL  AMCBC     Status: Abnormal   Collection Time: 07/27/19  4:44 AM  Result Value Ref Range   WBC 10.4 4.0 - 10.5 K/uL   RBC 3.15 (L) 4.22 - 5.81 MIL/uL   Hemoglobin 10.9 (L) 13.0 - 17.0 g/dL   HCT 30.8 (L) 39.0 - 52.0 %   MCV 97.8 80.0 - 100.0 fL   MCH 34.6 (H) 26.0 - 34.0 pg   MCHC 35.4 30.0 - 36.0 g/dL   RDW 14.3 11.5 - 15.5 %   Platelets 190 150 - 400 K/uL   nRBC 0.0 0.0 - 0.2 %    Comment: Performed at [facility], [place]. [address], [place], [place] [id number]  Basic metabolic panel     Status: Abnormal   Collection Time: 07/27/19  4:44 AM  Result Value Ref Range   Sodium 138 135 - 145 mmol/L   Potassium 4.4 3.5 - 5.1 mmol/L   Chloride 106 98 - 111 mmol/L   CO2 22 22 - 32 mmol/L   Glucose, Bld 119 (H) 70 - 99 mg/dL   BUN 38 (H) 8 - 23 mg/dL   Creatinine, Ser 2.92 (H) 0.61 - 1.24 mg/dL   Calcium 8.3 (L) 8.9 - 10.3 mg/dL   GFR calc non Af Amer 20 (L) >60 mL/min   GFR calc Af Amer 23 (L) >60 mL/min   Anion gap 10 5 - 15    Comment: Performed at [facility] [address] [place], [place] [address]  Magnesium     Status: None   Collection Time: 07/27/19  4:44 AM  Result Value Ref Range   Magnesium 2.2 1.7 - 2.4 mg/dL    Comment: Performed at [facility] [address], [place], [place] [address]  Glucose, capillary     Status: None   Collection Time: 07/27/19  7:39 AM  Result Value Ref Range   Glucose-Capillary 93 70 - 99 mg/dL    DG Abd 1 View  Result Date: 07/25/2019 CLINICAL DATA:  Generalized abdominal pain. EXAM: ABDOMEN - 1 VIEW COMPARISON:  July 16, 2019. FINDINGS: No abnormal bowel dilatation is noted. Large amount of stool seen throughout the colon. No  abnormal calcifications are noted. IMPRESSION: Large stool burden is noted. No evidence of bowel obstruction or ileus. Electronically Signed   By: Marijo Conception M.D.   On: 07/25/2019 13:30   DG Swallowing  Func-Speech Pathology  Result Date: 07/26/2019 Objective Swallowing Evaluation: Type of Study: MBS-Modified Barium Swallow Study  Patient Details Name: Riley Eaton MRN: [id number] Date of Birth: 09/27/1940 Today's Date: 07/26/2019 Time: SLP Start Time (ACUTE ONLY): 0940 -SLP Stop Time (ACUTE ONLY): 1010 SLP Time Calculation (min) (ACUTE ONLY): 30 min Past Medical History: Past Medical History: Diagnosis Date . Chronic back pain  . Chronic low back pain 05/19/2017 . Chronic, continuous use of opioids   for back pain . Coronary artery calcification seen on CT scan  . Nerve pain  . Thoracic aortic aneurysm [facility])  Past Surgical History: Past Surgical History: Procedure Laterality Date . BACK SURGERY    09-2016 . back surgey   . ENDOSCOPIC RETROGRADE CHOLANGIOPANCREATOGRAPHY (ERCP) WITH PROPOFOL N/A 09/25/2018  Procedure: ENDOSCOPIC RETROGRADE CHOLANGIOPANCREATOGRAPHY (ERCP) WITH PROPOFOL;  Surgeon: Ronnette Juniper, MD;  Location: [place];  Service: Gastroenterology;  Laterality: N/A; . PANCREATIC STENT PLACEMENT  09/25/2018  Procedure: PANCREATIC STENT PLACEMENT;  Surgeon: Ronnette Juniper, MD;  Location: [facility] ENDOSCOPY;  Service: Gastroenterology;; . REMOVAL OF STONES  09/25/2018  Procedure: REMOVAL OF STONES;  Surgeon: Ronnette Juniper, MD;  Location: [facility] ENDOSCOPY;  Service: Gastroenterology;; . [name]  09/25/2018  Procedure: SPHINCTEROTOMY;  Surgeon: Ronnette Juniper, MD;  Location: [facility] ENDOSCOPY;  Service: Gastroenterology;; HPI: Riley Eaton is a 78 y.o. male who presented to ED 12/1 with abd pain and AMS.  Had been seen 1 day earlier and CT abd / pelv showed cholelithiasis without cholecystitis. He does have hx of chronic low back pain and chronic opioid use. Intubated 12/4-12/7  Subjective: Pt was seen in  radiology for repeat MBS to determine appropriateness for advanced consistencies. Assessment / Plan / Recommendation CHL IP CLINICAL IMPRESSIONS 07/26/2019 Clinical Impression Pt demonstrates improvement in oropharyngeal swallow when compared to MBS completed 07/21/2019. Pt cognitive impairment continues to exacerbate sensory and motor issues. Pt requires cues for small bites/sips and slow rate. Orally, pt exhibited tolerance of all consistencies. Extended oral prep of solid texture was noted, as well as missing dentition. Pharyngeally, pt exhibits delayed swallow across consistencies, with trigger at the level of the pyriform sinus on thin and nectar thick liquids, and at the vallecula on puree and solid textures. Deep flash penetration to the level of the vocal folds was seen during the swallow of thin liquids via cup, with no cough response. No penetration or aspiration seen on nectar thick liquid, puree, or solid consistencies. Will advance diet to dys 2 with nectar thick liquids, crushed meds, full supervision during po intake for assistance with self feeding. Safe swallow precautions were updated and sent with transport to post at head of the bed. SLP will continue to follow to provide education and assess diet tolerance.  SLP Visit Diagnosis Dysphagia, oropharyngeal phase (R13.12)     Impact on safety and function Moderate aspiration risk   CHL IP TREATMENT RECOMMENDATION 07/26/2019 Treatment Recommendations Therapy as outlined in treatment plan below   Prognosis 07/26/2019 Prognosis for Safe Diet Advancement Fair Barriers to Reach Goals Cognitive deficits   CHL IP DIET RECOMMENDATION 07/26/2019 SLP Diet Recommendations Dysphagia 2 (Fine chop) solids;Nectar thick liquid Liquid Administration via Cup;Straw Medication Administration Crushed with puree Compensations Slow rate;Small sips/bites Postural Changes Remain semi-upright after after feeds/meals (Comment);Seated upright at 90 degrees   CHL IP OTHER  RECOMMENDATIONS 07/26/2019   Oral Care Recommendations Oral care QID Other Recommendations Order thickener from pharmacy;Have oral suction available   CHL IP FOLLOW UP RECOMMENDATIONS 07/26/2019 Follow up Recommendations Skilled Nursing  facility;24 hour supervision/assistance   CHL IP FREQUENCY AND DURATION 07/26/2019 Speech Therapy Frequency (ACUTE ONLY) min 2x/week Treatment Duration 2 weeks      CHL IP ORAL PHASE 07/26/2019 Oral Phase Impaired   Oral - Mech Soft Impaired mastication;Delayed oral transit    CHL IP PHARYNGEAL PHASE 07/26/2019 Pharyngeal Phase Impaired   Pharyngeal- Nectar Teaspoon Delayed swallow initiation-pyriform sinuses   Pharyngeal- Thin Cup Delayed swallow initiation-pyriform sinuses;Reduced airway/laryngeal closure;Penetration/Aspiration during swallow Pharyngeal Material enters airway, CONTACTS cords and not ejected out   Pharyngeal- Puree Delayed swallow initiation-vallecula   Pharyngeal- Mechanical Soft Delayed swallow initiation-vallecula  CHL IP CERVICAL ESOPHAGEAL PHASE 07/26/2019 Cervical Esophageal Phase [facility] Riley Eaton, MSP, CCC-SLP Speech Language Pathologist Office: [phone] Pager: [phone] Shonna Chock 07/26/2019, 10:28 AM               Review of Systems Blood pressure (!) 134/91, pulse 75, temperature (!) 97.4 F (36.3 C), temperature source Oral, resp. rate (!) 25, height 5\' 7"  (1.702 m), weight 74.1 kg, SpO2 99 %. Physical Exam  Constitutional:  Frail, elderly, hard of hearing with sitter at bedside.   HENT:  Head: Normocephalic.  Eyes: Pupils are equal, round, and reactive to light.  Cardiovascular: Normal rate.  Respiratory: Effort normal.  GI: Soft.  Genitourinary:    Genitourinary Comments: NO CVAT. Some soft penile edema w/o phimosis / paraphimosis. Foley in place with clear yellow urine. DRE 60 gm smooth.    Neurological:  AOx2. Pleasant, but stigmata of delirium / dementia.   Skin: Skin is warm.  Psychiatric: He has a normal mood and  affect.    Assessment/Plan:  1 - Urinary Retention / Enlarged Prostate - DDX neurogenic v. Outlet obsturction. Easy placement of foley argues against striture recurrence. Rec begin finasteride in addition to tamsulosin, I placed this on DC med rec and DC with foley in place. We will reqeust f/u at our office in few weeks for voiding trial.   2 - Bulbar Urethral Stricture - foley palcemetn today argues against high grade recurrence.   3 - Acute Renal Failure - suspenct from acute retention, this will likley rapidly improve.   We will request Urol FU in outpateint setting. Please call with questions.   Alexis Frock 07/27/2019, 8:42 AM

## 2019-07-28 ENCOUNTER — Other Ambulatory Visit: Payer: Self-pay

## 2019-07-28 DIAGNOSIS — N139 Obstructive and reflux uropathy, unspecified: Secondary | ICD-10-CM

## 2019-07-28 DIAGNOSIS — R638 Other symptoms and signs concerning food and fluid intake: Secondary | ICD-10-CM

## 2019-07-28 DIAGNOSIS — R1013 Epigastric pain: Secondary | ICD-10-CM

## 2019-07-28 DIAGNOSIS — N289 Disorder of kidney and ureter, unspecified: Secondary | ICD-10-CM

## 2019-07-28 LAB — BASIC METABOLIC PANEL
Anion gap: 12 (ref 5–15)
BUN: 53 mg/dL — ABNORMAL HIGH (ref 8–23)
CO2: 18 mmol/L — ABNORMAL LOW (ref 22–32)
Calcium: 7.9 mg/dL — ABNORMAL LOW (ref 8.9–10.3)
Chloride: 107 mmol/L (ref 98–111)
Creatinine, Ser: 4.12 mg/dL — ABNORMAL HIGH (ref 0.61–1.24)
GFR calc Af Amer: 15 mL/min — ABNORMAL LOW (ref >60)
GFR calc non Af Amer: 13 mL/min — ABNORMAL LOW (ref >60)
Glucose, Bld: 104 mg/dL — ABNORMAL HIGH (ref 70–99)
Potassium: 3.8 mmol/L (ref 3.5–5.1)
Sodium: 137 mmol/L (ref 135–145)

## 2019-07-28 LAB — GLUCOSE, CAPILLARY
Glucose-Capillary: 116 mg/dL — ABNORMAL HIGH (ref 70–99)
Glucose-Capillary: 123 mg/dL — ABNORMAL HIGH (ref 70–99)
Glucose-Capillary: 91 mg/dL (ref 70–99)
Glucose-Capillary: 94 mg/dL (ref 70–99)
Glucose-Capillary: 99 mg/dL (ref 70–99)

## 2019-07-28 LAB — CBC
HCT: 27.4 % — ABNORMAL LOW (ref 39.0–52.0)
Hemoglobin: 9.7 g/dL — ABNORMAL LOW (ref 13.0–17.0)
MCH: 34.4 pg — ABNORMAL HIGH (ref 26.0–34.0)
MCHC: 35.4 g/dL (ref 30.0–36.0)
MCV: 97.2 fL (ref 80.0–100.0)
Platelets: 142 10*3/uL — ABNORMAL LOW (ref 150–400)
RBC: 2.82 MIL/uL — ABNORMAL LOW (ref 4.22–5.81)
RDW: 14.1 % (ref 11.5–15.5)
WBC: 6.8 10*3/uL (ref 4.0–10.5)
nRBC: 0 % (ref 0.0–0.2)

## 2019-07-28 MED ORDER — TRAZODONE HCL 50 MG PO TABS
25.0000 mg | ORAL_TABLET | Freq: Once | ORAL | Status: AC
  Administered 2019-07-28: 21:00:00 25 mg via ORAL
  Filled 2019-07-28: qty 1

## 2019-07-28 MED ORDER — SODIUM BICARBONATE 650 MG PO TABS
1300.0000 mg | ORAL_TABLET | Freq: Two times a day (BID) | ORAL | Status: DC
  Administered 2019-07-28 – 2019-07-31 (×7): 1300 mg via ORAL
  Filled 2019-07-28 (×7): qty 2

## 2019-07-28 MED ORDER — ACETAMINOPHEN 160 MG/5ML PO SOLN
650.0000 mg | ORAL | Status: DC | PRN
  Administered 2019-07-28 – 2019-08-01 (×6): 650 mg via ORAL
  Filled 2019-07-28 (×6): qty 20.3

## 2019-07-28 NOTE — Progress Notes (Signed)
  Speech Language Pathology Treatment: Dysphagia  Patient Details Name: Riley Eaton MRN: [id number] DOB: 08-20-1940 Today's Date: 07/28/2019 Time: [id number] SLP Time Calculation (min) (ACUTE ONLY): 25 min  Assessment / Plan / Recommendation Clinical Impression  Pt seen for skilled ST intervention targeting goals for diet tolerance and advancement, as well as adherence to safe swallow precautions. Pt was seated in recliner. Daughter and sitter present. Pt accepted trials of thin liquid via cup, puree, and solid textures. He appears to tolerate advanced solids without difficulty, and is now tolerating whole meds per RN. Will advance diet to dys 3 (mechanical soft) at this time. No overt s/s aspiration observed with thin liquids today, however, on MBS pt exhibited silent deep penetration. Will keep pt on nectar thick liquids for now, and continue to follow for tolerance of advanced solids prior to advancing liquids. Safe swallow precautions updated in pt room, and RN and MD were notified of diet change. SLP will continue to follow per plan of care.    HPI HPI: Riley Eaton is a 78 y.o. male who presented to ED 12/1 with abd pain and AMS.  Had been seen 1 day earlier and CT abd / pelv showed cholelithiasis without cholecystitis. He does have hx of chronic low back pain and chronic opioid use. Intubated 12/4-12/7       SLP Plan  Continue with current plan of care       Recommendations  Diet recommendations: Dysphagia 3 (mechanical soft);Nectar-thick liquid Liquids provided via: Cup;Straw Medication Administration: Whole meds with puree Supervision: Staff to assist with self feeding;Patient able to self feed;Full supervision/cueing for compensatory strategies Compensations: Slow rate;Small sips/bites Postural Changes and/or Swallow Maneuvers: Seated upright 90 degrees                Oral Care Recommendations: Oral care BID Follow up Recommendations: Skilled Nursing facility;24  hour supervision/assistance SLP Visit Diagnosis: Dysphagia, oropharyngeal phase (R13.12) Plan: Continue with current plan of care       Nicholas, [facility], [place] Pathologist Office: [phone] Pager: [phone]  Shonna Chock 07/28/2019, 4:11 PM

## 2019-07-28 NOTE — Consult Note (Signed)
[facility] Nephrology Consultation Note  Requesting MD: Dr Benny Lennert, A[place] Reason for consult: AKI  HPI:  Riley Eaton is a 78 y.o. male with history of CAD, chronic back pain, thoracic aortic aneurysm who was initially admitted on 12/1 for AMS and abdominal pain due to cholecystitis, seen as a consultation requested by Dr. Benny Lennert for AKI.  Patient with a prolonged hospitalization and has complicated course including encephalopathy, respiratory failure required mechanical ventilation.  He was subsequently extubated.  The HIDA scan was unremarkable.  He was noted to have an NSTEMI with elevated troponin for which cardiology was consulted.  The serum creatinine level is normal at baseline however the creatinine level elevated to 2.92 on 1215 and then gradually increased to 4.12 today.  UA likely due to trauma.  Bladder scan shows around 1000 cc of urine.  Urology was consulted and had indwelling Foley catheter place on 12/15.  He has total urine output of 2500 cc.  The creatinine level continued to go up today to 4.12 therefore we are consulted.  The kidney ultrasound showed mild to moderate left hydronephrosis and the right kidney was not visualized. 12/6. He is still is confused.  Denies chest pain, shortness of breath, nausea or vomiting, dysuria or pelvic pain.  No headache or dizziness.  As per previous chart he was on losartan and spironolactone which is on hold now.  PMHx:   Past Medical History:  Diagnosis Date  . Chronic back pain   . 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 Chronic, continuous use of opioids    for back pain  . Coronary artery calcification seen on CT scan   . Nerve pain   . Thoracic aortic aneurysm [facility])     Past Surgical History:  Procedure Laterality Date  . BACK SURGERY     09-2016  . back surgey    . ENDOSCOPIC RETROGRADE CHOLANGIOPANCREATOGRAPHY (ERCP) WITH PROPOFOL N/A 09/25/2018   Procedure: ENDOSCOPIC RETROGRADE CHOLANGIOPANCREATOGRAPHY  (ERCP) WITH PROPOFOL;  Surgeon: Ronnette Juniper, MD;  Location: [place];  Service: Gastroenterology;  Laterality: N/A;  . PANCREATIC STENT PLACEMENT  09/25/2018   Procedure: PANCREATIC STENT PLACEMENT;  Surgeon: Ronnette Juniper, MD;  Location: [place];  Service: Gastroenterology;;  . REMOVAL OF STONES  09/25/2018   Procedure: REMOVAL OF STONES;  Surgeon: Ronnette Juniper, MD;  Location: [place];  Service: Gastroenterology;;  . [name]  09/25/2018   Procedure: SPHINCTEROTOMY;  Surgeon: Ronnette Juniper, MD;  Location: [facility] ENDOSCOPY;  Service: Gastroenterology;;    Family Hx:  Family History  Problem Relation Age of Onset  . Cancer Mother   . Heart attack Father     Social History:  reports that he has never smoked. He has never used smokeless tobacco. He reports that he does not drink alcohol or use drugs.  Allergies: No Known Allergies  Medications: Prior to Admission medications   Medication Sig Start Date End Date Taking? Authorizing Provider  allopurinol (ZYLOPRIM) 100 MG tablet TAKE 1 TABLET BY MOUTH EVERY DAY Patient taking differently: Take 100 mg by mouth daily as needed (gout flare up).  03/16/19   Kathrynn Ducking, MD  bisacodyl (DULCOLAX) 10 MG suppository Place 1 suppository (10 mg total) rectally as needed for moderate constipation. 10/26/17   Varney Biles, MD  calcium-vitamin D (OSCAL WITH D) 500-200 MG-UNIT tablet Take 1 tablet by mouth daily with breakfast.    [provider]  Diphenhydramine-APAP, sleep, (TYLENOL PM EXTRA STRENGTH PO) Take 1 tablet by mouth at  bedtime.     [provider]  DULoxetine (CYMBALTA) 60 MG capsule TAKE 1 CAPSULE BY MOUTH TWICE A DAY Patient taking differently: Take 60 mg by mouth 2 (two) times daily.  07/01/19   Kathrynn Ducking, MD  finasteride (PROSCAR) 5 MG tablet Take 1 tablet (5 mg total) by mouth daily. For urination 07/27/19   Alexis Frock, MD  HYDROcodone-acetaminophen (NORCO/VICODIN) 5-325 MG tablet Take 1  tablet by mouth every 6 (six) hours as needed for moderate pain. 05/03/19   Kathrynn Ducking, MD  MOVANTIK 25 MG TABS tablet Take 25 mg by mouth daily. 06/14/19   [provider]  omeprazole (PRILOSEC) 20 MG capsule Take 20 mg by mouth daily. 05/17/19   [provider]  oxyCODONE (OXYCONTIN) 40 mg 12 hr tablet Take 1 tablet (40 mg total) by mouth 3 (three) times daily. 05/03/19   Kathrynn Ducking, MD  pregabalin (LYRICA) 200 MG capsule Take 1 capsule (200 mg total) by mouth 3 (three) times daily. 04/13/19   Kathrynn Ducking, MD  promethazine (PHENERGAN) 25 MG tablet Take 25 mg by mouth every 8 (eight) hours as needed for nausea/vomiting. 06/15/19   [provider]  tamsulosin (FLOMAX) 0.4 MG CAPS capsule Take 1 capsule (0.4 mg total) by mouth daily. 07/27/19   Alexis Frock, MD  traZODone (DESYREL) 50 MG tablet Take 50 mg by mouth at bedtime. 05/17/19   [provider]    I have reviewed the patient's current medications.  Labs:  Results for orders placed or performed during the hospital encounter of 07/13/19 (from the past 48 hour(s))  Glucose, capillary     Status: Abnormal   Collection Time: 07/26/19  4:48 PM  Result Value Ref Range   Glucose-Capillary 102 (H) 70 - 99 mg/dL  Glucose, capillary     Status: None   Collection Time: 07/26/19  7:27 PM  Result Value Ref Range   Glucose-Capillary 97 70 - 99 mg/dL  Glucose, capillary     Status: Abnormal   Collection Time: 07/27/19 12:01 AM  Result Value Ref Range   Glucose-Capillary 100 (H) 70 - 99 mg/dL  Glucose, capillary     Status: None   Collection Time: 07/27/19  3:35 AM  Result Value Ref Range   Glucose-Capillary 95 70 - 99 mg/dL  AMCBC     Status: Abnormal   Collection Time: 07/27/19  4:44 AM  Result Value Ref Range   WBC 10.4 4.0 - 10.5 K/uL   RBC 3.15 (L) 4.22 - 5.81 MIL/uL   Hemoglobin 10.9 (L) 13.0 - 17.0 g/dL   HCT 30.8 (L) 39.0 - 52.0 %   MCV 97.8 80.0 - 100.0 fL   MCH 34.6 (H) 26.0 -  34.0 pg   MCHC 35.4 30.0 - 36.0 g/dL   RDW 14.3 11.5 - 15.5 %   Platelets 190 150 - 400 K/uL   nRBC 0.0 0.0 - 0.2 %    Comment: Performed at [facility], [place] [address], [place], [place] [id number]  Basic metabolic panel     Status: Abnormal   Collection Time: 07/27/19  4:44 AM  Result Value Ref Range   Sodium 138 135 - 145 mmol/L   Potassium 4.4 3.5 - 5.1 mmol/L   Chloride 106 98 - 111 mmol/L   CO2 22 22 - 32 mmol/L   Glucose, Bld 119 (H) 70 - 99 mg/dL   BUN 38 (H) 8 - 23 mg/dL   Creatinine, Ser 2.92 (  H) 0.61 - 1.24 mg/dL   Calcium 8.3 (L) 8.9 - 10.3 mg/dL   GFR calc non Af Amer 20 (L) >60 mL/min   GFR calc Af Amer 23 (L) >60 mL/min   Anion gap 10 5 - 15    Comment: Performed at [facility] [address], [place], [place] [address]  Magnesium     Status: None   Collection Time: 07/27/19  4:44 AM  Result Value Ref Range   Magnesium 2.2 1.7 - 2.4 mg/dL    Comment: Performed at [facility] [address], [place], [place] [address]  Glucose, capillary     Status: None   Collection Time: 07/27/19  7:39 AM  Result Value Ref Range   Glucose-Capillary 93 70 - 99 mg/dL  Renal function panel     Status: Abnormal   Collection Time: 07/27/19 11:05 AM  Result Value Ref Range   Sodium 134 (L) 135 - 145 mmol/L   Potassium 3.8 3.5 - 5.1 mmol/L   Chloride 106 98 - 111 mmol/L   CO2 18 (L) 22 - 32 mmol/L   Glucose, Bld 213 (H) 70 - 99 mg/dL   BUN 43 (H) 8 - 23 mg/dL   Creatinine, Ser 3.27 (H) 0.61 - 1.24 mg/dL   Calcium 8.1 (L) 8.9 - 10.3 mg/dL   Phosphorus 5.1 (H) 2.5 - 4.6 mg/dL   Albumin 3.0 (L) 3.5 - 5.0 g/dL   GFR calc non Af Amer 17 (L) >60 mL/min   GFR calc Af Amer 20 (L) >60 mL/min   Anion gap 10 5 - 15    Comment: Performed at [facility], [place] [address] [place], [place] [address]  Osmolality     Status: Abnormal   Collection Time: 07/27/19 11:05 AM  Result Value Ref Range   Osmolality 298 (H) 275 - 295 mOsm/kg    Comment: Performed at [facility], [place] [address] [place], [place] [address]  Glucose, capillary     Status: Abnormal   Collection Time: 07/27/19 11:22 AM  Result Value Ref Range   Glucose-Capillary 176 (H) 70 - 99 mg/dL  Urinalysis, Routine w reflex microscopic     Status: Abnormal   Collection Time: 07/27/19  4:43 PM  Result Value Ref Range   Color, Urine YELLOW YELLOW   APPearance CLOUDY (A) CLEAR   Specific Gravity, Urine 1.013 1.005 - 1.030   pH 5.0 5.0 - 8.0   Glucose, UA NEGATIVE NEGATIVE mg/dL   Hgb urine dipstick LARGE (A) NEGATIVE   Bilirubin Urine NEGATIVE NEGATIVE   Ketones, ur NEGATIVE NEGATIVE mg/dL   Protein, ur 30 (A) NEGATIVE mg/dL   Nitrite NEGATIVE NEGATIVE   Leukocytes,Ua NEGATIVE NEGATIVE   RBC / HPF >50 (H) 0 - 5 RBC/hpf   WBC, UA 0-5 0 - 5 WBC/hpf   Bacteria, UA MANY (A) NONE SEEN    Comment: Performed at [facility], [address] [place], [place] [address]  Sodium, urine, random     Status: None   Collection Time: 07/27/19  4:44 PM  Result Value Ref Range   Sodium, Ur 67 mmol/L    Comment: Performed at [facility] [address], [place], [place] [address]  Creatinine, urine, random     Status: None   Collection Time: 07/27/19  4:44 PM  Result Value Ref Range   Creatinine, Urine 94.12 mg/dL    Comment: Performed at [facility],  [address], [place], [place] [address]  Osmolality, urine     Status: None   Collection Time: 07/27/19  4:44 PM  Result Value Ref Range   Osmolality, Ur 440 300 - 900 mOsm/kg    Comment: Performed at [facility] [address], [place], [place] [address]  Glucose, capillary     Status: None   Collection Time: 07/27/19  4:50 PM  Result Value Ref Range   Glucose-Capillary 89 70 - 99 mg/dL  Glucose, capillary     Status: Abnormal   Collection Time: 07/27/19  7:46 PM  Result Value Ref Range   Glucose-Capillary 163 (H) 70 - 99 mg/dL  Glucose, capillary     Status: None   Collection Time: 07/27/19 11:58 PM   Result Value Ref Range   Glucose-Capillary 94 70 - 99 mg/dL  AMCBC     Status: Abnormal   Collection Time: 07/28/19  6:17 AM  Result Value Ref Range   WBC 6.8 4.0 - 10.5 K/uL   RBC 2.82 (L) 4.22 - 5.81 MIL/uL   Hemoglobin 9.7 (L) 13.0 - 17.0 g/dL   HCT 27.4 (L) 39.0 - 52.0 %   MCV 97.2 80.0 - 100.0 fL   MCH 34.4 (H) 26.0 - 34.0 pg   MCHC 35.4 30.0 - 36.0 g/dL   RDW 14.1 11.5 - 15.5 %   Platelets 142 (L) 150 - 400 K/uL   nRBC 0.0 0.0 - 0.2 %    Comment: Performed at [facility], [place] [address], [place], [place] [id number]  Basic metabolic panel     Status: Abnormal   Collection Time: 07/28/19  6:17 AM  Result Value Ref Range   Sodium 137 135 - 145 mmol/L   Potassium 3.8 3.5 - 5.1 mmol/L   Chloride 107 98 - 111 mmol/L   CO2 18 (L) 22 - 32 mmol/L   Glucose, Bld 104 (H) 70 - 99 mg/dL   BUN 53 (H) 8 - 23 mg/dL   Creatinine, Ser 4.12 (H) 0.61 - 1.24 mg/dL   Calcium 7.9 (L) 8.9 - 10.3 mg/dL   GFR calc non Af Amer 13 (L) >60 mL/min   GFR calc Af Amer 15 (L) >60 mL/min   Anion gap 12 5 - 15    Comment: Performed at [facility] [address] [place],  [address]  Glucose, capillary     Status: None   Collection Time: 07/28/19  7:58 AM  Result Value Ref Range   Glucose-Capillary 99 70 - 99 mg/dL  Glucose, capillary     Status: None   Collection Time: 07/28/19 11:53 AM  Result Value Ref Range   Glucose-Capillary 91 70 - 99 mg/dL     ROS:  Pertinent items noted in HPI and remainder of comprehensive ROS otherwise negative.  Physical Exam: Vitals:   07/28/19 0842 07/28/19 1348  BP: 127/77 (!) 140/95  Pulse: 80   Resp: 19 (!) 23  Temp:  98 F (36.7 C)  SpO2: 98%      General exam: Appears calm and comfortable  Respiratory system: Clear to auscultation. Respiratory effort normal. No wheezing or crackle Cardiovascular system: S1 & S2 heard, RRR.  Trace pedal edema. Gastrointestinal system: Abdomen is nondistended, soft and nontender. Normal bowel  sounds heard. Central nervous system: Alert awake and following command, somewhat confused. Extremities: Symmetric 5 x 5 power. Skin: No rashes, lesions or ulcers Psychiatry: Judgement and insight appear impaired.  Urology: Indwelling Foley  catheter with pinkish urine.  Assessment/Plan:  #Acute kidney injury, nonoliguric likely obstructive uropathy and may have some hemodynamic changes. -UA likely traumatic.  Kidney US consistent with hydronephrosis. -Continue indwelling Foley catheter. -Strict ins and out and monitor BMP. -Given CHF, I will not order IV fluid.  Hopefully the creatinine level will plateau and has renal recovery.  No indication for dialysis.  Do not think BUN is causing is AMS.  #Acute urinary retention/hydronephrosis: On Flomax, finasteride and has Foley catheter.  Seen by urologist.  #Metabolic acidosis: Start sodium bicarbonate p.o.  #Hypertension: Blood pressure acceptable.  On carvedilol.  #Acute metabolic encephalopathy: Monitor mental status.  Per primary team.  #Acute respiratory failure with hypoxia: He required mechanical ventilation, now on room air.  #NSTEMI: Seen by cardiology.  Planned for outpatient follow-up for possible cardiac cath.  Thank you for the consult.  We will follow with you.  Luisantonio Adinolfi Tanna Furry 07/28/2019, 2:02 PM  [facility].

## 2019-07-28 NOTE — Progress Notes (Signed)
PROGRESS NOTE  Riley Eaton [id number] DOB: 13-Jul-1941 DOA: 07/13/2019 PCP: Aletha Halim., PA-C  Brief History   Riley R Lawsonis a 78 y.o.malewith PMH including but not limited to thoracic aortic aneurysm, CAD, chronic back pain, chronic opioid use presented to [facility] ED 12/1 with altered mental status and abd pain. He had been seen in [facility] ED 1 day prior for abd pain. Patient had CT of the abd at the time that demonstrated cholelithiasis without cholecystitis. Surgery was consulted and recommended outpatient follow up. He was subsequently discharged home.  On 12/1 patient had worsening of pain along with AMS; therefore, came to [facility] ED. He was quite agitated per reports and received 1mg  dilaudid, 5mg  haldol, 1mg  ativan x 3 and had minimal response. PCCM was asked to see in consultation for consideration of precedex. Per chart review, he has no underlying hx of EtOH abuse or substance abuse. He does have hx of chronic low back pain and chronic opioid use. On review of outpatient med list, he had 5-325 norco prescribed for q6hrs PRN, 40mg  oxycontin TID, lyrica 200mg  TID, duloxetine 60mg  BID.  Per chart review, he had admission 09/24/18 through 10/06/18 for acute ascending cholangitis s/p ERCP and sphincterotomy with stenting of pancreatic duct (Dr. Therisa Doyne). During that admission, he did have severe delirium felt to be due to narcotic withdrawal. He did spend a few days in ICU and was briefly treated with precedex and fentanyl. He was discharged and supposed to be evaluated by surgery as an outpatient for cholecystectomy; however, this was postponed due to the [place] pandemic. Per discharge summary, his medication regimen was adjusted prior to d/c and pt had been tolerating it well (oxycodone weaned from 80mg  BID to BID, lyrica reduced from 200mg  TID to 100mg  TID, cymbalta from 60mg  BID to 60mg  daily).  Patient found to have acute metabolic encephalopathy, unknown cause.  Is  also noted to have acute respiratory failure and required intubation for airway protection.  Currently he is on room air.  There were complaints of abdominal pain however HIDA scan was unremarkable.  Patient also noted to have NSTEMI with elevated troponins over 2000.  Cardiology consulted, pending heart catheterization possibly later this week vs as an outpatient given his current renal failure and mental status. Nephrology has been consulted.  Plan is for the patient to go to inpatient rehab once work up is completed.  Consultants  . Cardiology . Nephrology . Gastroenterology . General Surgery . Interventional Radiation . PCCM  Procedures  . MRCP . Mechanical ventilation  Antibiotics   Anti-infectives (From admission, onward)   Start     Dose/Rate Route Frequency Ordered Stop   07/21/19 0100  vancomycin (VANCOCIN) 1,500 mg in sodium chloride 0.9 % 500 mL IVPB     1,500 mg 250 mL/hr over 120 Minutes Intravenous Every 12 hours 07/20/19 1608 07/26/19 1437   07/20/19 1018  cefTRIAXone (ROCEPHIN) 2 g in sodium chloride 0.9 % 100 mL IVPB  Status:  Discontinued     2 g 200 mL/hr over 30 Minutes Intravenous Every 24 hours 07/19/19 1115 07/20/19 0800   07/20/19 1018  cefTRIAXone (ROCEPHIN) 2 g in sodium chloride 0.9 % 100 mL IVPB  Status:  Discontinued     2 g 200 mL/hr over 30 Minutes Intravenous Every 24 hours 07/20/19 0800 07/20/19 0921   07/17/19 0100  vancomycin (VANCOCIN) IVPB 750 mg/150 ml premix  Status:  Discontinued     750 mg 150 mL/hr over 60 Minutes Intravenous  Every 12 hours 07/16/19 1240 07/20/19 1608   07/16/19 1400  acyclovir (ZOVIRAX) 700 mg in dextrose 5 % 100 mL IVPB  Status:  Discontinued     10 mg/kg  69.9 kg 114 mL/hr over 60 Minutes Intravenous Every 8 hours 07/16/19 1240 07/19/19 1115   07/16/19 1245  cefTRIAXone (ROCEPHIN) 2 g in sodium chloride 0.9 % 100 mL IVPB  Status:  Discontinued     2 g 200 mL/hr over 30 Minutes Intravenous Every 12 hours 07/16/19 1240  07/19/19 1115   07/16/19 1245  vancomycin (VANCOCIN) 1,500 mg in sodium chloride 0.9 % 500 mL IVPB     1,500 mg 250 mL/hr over 120 Minutes Intravenous  Once 07/16/19 1240 07/16/19 1609   07/16/19 1245  ampicillin (OMNIPEN) 2 g in sodium chloride 0.9 % 100 mL IVPB  Status:  Discontinued     2 g 300 mL/hr over 20 Minutes Intravenous Every 4 hours 07/16/19 1240 07/19/19 1115   07/13/19 1900  cefTRIAXone (ROCEPHIN) 1 g in sodium chloride 0.9 % 100 mL IVPB  Status:  Discontinued     1 g 200 mL/hr over 30 Minutes Intravenous Every 24 hours 07/13/19 1825 07/16/19 1240   07/13/19 1900  metroNIDAZOLE (FLAGYL) IVPB 500 mg  Status:  Discontinued     500 mg 100 mL/hr over 60 Minutes Intravenous Every 8 hours 07/13/19 1825 07/20/19 0921    .  Subjective  The patient is sitting up in a chair at bedside. He perseverates on his dietary restrictions and going to rehab.  Objective   Vitals:  Vitals:   07/28/19 0842 07/28/19 1348  BP: 127/77 (!) 140/95  Pulse: 80 72  Resp: 19 (!) 23  Temp:  98 F (36.7 C)  SpO2: 98%    Exam:  Constitutional:  . The patient is awake, alert, and oriented x 3. He is fairly agitated. Respiratory:  . No increased work of breathing. . No wheezes, rales, or rhonchi . No tactile fremitus Cardiovascular:  . Regular rate and rhythm . No murmurs, ectopy, or gallups. . No lateral PMI. No thrills. Abdomen:  . Abdomen is soft, non-tender, non-distended . No hernias, masses, or organomegaly . Normoactive bowel sounds.  Musculoskeletal:  . No cyanosis, clubbing, or edema Skin:  . No rashes, lesions, ulcers . palpation of skin: no induration or nodules Neurologic:  . CN 2-12 intact . Sensation all 4 extremities intact . Moving all extremities Psychiatric:  . Mental status: Pt is agitated.    . judgment and insight not intact  I have personally reviewed the following:   Today's Data  . Vitals . BMP . CBC  Micro Data  . MRSA by PCR positive  07/19/2019.  Scheduled Meds: . aspirin  81 mg Oral Daily  . atorvastatin  40 mg Oral q1800  . bisacodyl  5 mg Oral BID  . carvedilol  12.5 mg Oral BID WC  . Chlorhexidine Gluconate Cloth  6 each Topical Daily  . cloNIDine  0.1 mg Transdermal Weekly  . enoxaparin (LOVENOX) injection  30 mg Subcutaneous Q24H  . finasteride  5 mg Oral Daily  . lidocaine  1 application Urethral Once  . mouth rinse  15 mL Mouth Rinse BID  . pantoprazole  40 mg Oral QHS  . polyethylene glycol  17 g Oral Daily  . sodium bicarbonate  1,300 mg Oral BID  . tamsulosin  0.4 mg Oral Daily   Continuous Infusions:  Active Problems:   Chronic low back pain  Choledocholithiasis   Acute metabolic encephalopathy   Mental status alteration   ACS (acute coronary syndrome) (HCC)   Endotracheal tube present   Alteration in nutrition   Encephalopathy   Abdominal pain   Non-ST elevation (NSTEMI) myocardial infarction ([place])   Hypokalemia   DCM (dilated cardiomyopathy) ([place])   LOS: 15 days   A & P  Acute metabolic encephalopathy: Much improved from previous. Not sure what baseline mental status is for this gentleman. He almost seems a little manic now. Previous work up of his mental status included CT head/MRI, lactate, procalcitonin, TSH ammonium levels. Blood cultures have had no growth. He is on chronic oxycodone for chronic pain. He continues to require a Actuary. Considering psychiatric consult.   Acute kidney injury and urinary retention: Creatinine 4.12 today. His baseline appears to be 1.7. Nephrology consulted as creatinine continues to rise.Losartan and spironolactone have been held. Renal ultrasound demonstrated mild to moderate hydronephrosis on the left. ; demonstrated. Urology has been consulted as well following a failed attempt to place a foley catheter following bladder scan with 999 of urine. 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 Coude catheter finally placed by catheter team with 1.8 L of  urine out. Cardiology plans on possible LHC later this week.  Acute respiratory failure: Patient was intubated for airway protection. He also had aspiration pneumonia. Sputum culture was positive for MRSA. He had 10 days of IV vancomycin.  The patient is currently saturating at 98% on room air.   Abdominal pain: No current complaints of abdominal pain. The patient is s/p ERCP with sphincterotomy and pancreatic duct stenting in 09/25/2018. CT abdomen performed on this admission demonstrated cholelithiasis with no active disease. HIDA scan suggested chronic cholecystitis, no evidence for acute cholecystitis, and a patent cystic duct. LFT's are now within normal limits.   Prolonged QT: Avoid offending agents. Monitor potassium and magnesium.  Elevated troponin/NSTEMI: Troponin peaked at 2110 on 07/15/2019. EKG on 07/16/2019 showed deep T wave inversions in the inferior and anterior lateral leads. Echocardiogram 07/14/2019 shows an EF of 30 to 35%, mildly increased LV hypertrophy.  Grade 1 diastolic dysfunction. Cardiology consulted and appreciated. Patient noted to have coronary calcification on CT scan back in February 2020, but he has no current complaints of chest pain. Cardiology is considering LHC later this week, if renal insufficiency improves. Nephrology has been consulted.   Dilated cardiomyopathy/acute systolic heart failure: Echocardiogram demonstrated EF of 30-35% with Grade 1 diastolic dysfunction and mildly increased LV hypertrophy. He is continued on coreg and telemetry monitoring.  Essential hypertension: Blood pressures remain labile on Coreg. Well controlled this morning. High this afternoon. Losartan, spironolactone held. Will increase dose of coreg.  Hypokalemia: Resolved. Monitor.  Dysphagia/poor oral intake: Pt is obsessed with liberating his diet. SLP has advanced him to a mechanical soft diet.   Deconditioning/debility: Plan is for the patient to go to CIR when work up is  completed and bed is available.  I have seen and examined this patient myself. I have spent 34 minutes in his evaluation and care.  DVT Prophylaxis: Lovenox Code Status: Full Family Communication: None at bedside Disposition Plan: Admitted.  Patient now with urinary retention and acute kidney injury.  Disposition CIR when medically clear and bed is available.   Mayjor Ager, DO [facility] Direct contact: see [email or web address]  7PM-7AM contact night coverage as above 07/28/2019, 4:54 PM  LOS: 15 days

## 2019-07-28 NOTE — Progress Notes (Signed)
Physical Therapy Treatment Patient Details Name: Riley Eaton MRN: [id number] DOB: 11-26-1940 Today's Date: 07/28/2019    History of Present Illness Riley Eaton is a 78 y.o. male who presented to ED 12/1 with abd pain and AMS.  Had been seen 1 day earlier and CT abd / pelv showed cholelithiasis without cholecystitis. Intubated 12/4-12/7. PHMx: Thoracic aortic aneurysm; Chronic low back pain; Choledocholithiasis; Chronic pain; Acute metabolic encephalopathy; ACS. 12/3 CT head: Significant motion limitations. No evidence of acute abnormality    PT Comments    Excellent mobility progress. Pt required min assist bed mobility, mod assist sit to stand, and min assist ambulation 35' with RW. Improved cognitively. Remains easily distracted with poor safety awareness. Follows simple commands. Pt in recliner with sitter in room at end of session.    Follow Up Recommendations  CIR;Supervision/Assistance - 24 hour     Equipment Recommendations  Other (comment)(defer to next venue)    Recommendations for Other Services       Precautions / Restrictions Precautions Precautions: Fall    Mobility  Bed Mobility Overal bed mobility: Needs Assistance Bed Mobility: Supine to Sit     Supine to sit: Min assist;HOB elevated     General bed mobility comments: +rail, increased time  Transfers Overall transfer level: Needs assistance Equipment used: Rolling walker (2 wheeled) Transfers: Sit to/from Stand Sit to Stand: Mod assist         General transfer comment: cues for hand placement and sequencing, increased time to stabilize initial balance  Ambulation/Gait Ambulation/Gait assistance: Min assist Gait Distance (Feet): 35 Feet Assistive device: Rolling walker (2 wheeled) Gait Pattern/deviations: Step-through pattern;Decreased stride length;Trunk flexed;Drifts right/left Gait velocity: decreased   General Gait Details: assist to maintain balance and manage RW during  turns   Stairs             Wheelchair Mobility    Modified Rankin (Stroke Patients Only)       Balance Overall balance assessment: Needs assistance Sitting-balance support: Feet supported;No upper extremity supported Sitting balance-Leahy Scale: Fair     Standing balance support: Bilateral upper extremity supported;During functional activity Standing balance-Leahy Scale: Poor Standing balance comment: reliant on external support                            Cognition Arousal/Alertness: Awake/alert Behavior During Therapy: WFL for tasks assessed/performed Overall Cognitive Status: Impaired/Different from baseline Area of Impairment: Orientation;Attention;Memory;Following commands;Safety/judgement;Problem solving;Awareness                 Orientation Level: Disoriented to;Situation;Time Current Attention Level: Sustained Memory: Decreased short-term memory Following Commands: Follows one step commands with increased time Safety/Judgement: Decreased awareness of safety;Decreased awareness of deficits Awareness: Emergent Problem Solving: Difficulty sequencing;Requires verbal cues General Comments: Easily distracted with poor safety awareness. Much more clear as compared to previous session.      Exercises      General Comments General comments (skin integrity, edema, etc.): VSS      Pertinent Vitals/Pain Pain Assessment: No/denies pain    Home Living                      Prior Function            PT Goals (current goals can now be found in the care plan section) Acute Rehab PT Goals Patient Stated Goal: home Progress towards PT goals: Progressing toward goals    Frequency  Min 3X/week      PT Plan Current plan remains appropriate    Co-evaluation              AM-PAC PT "6 Clicks" Mobility   Outcome Measure  Help needed turning from your back to your side while in a flat bed without using bedrails?: A  Little Help needed moving from lying on your back to sitting on the side of a flat bed without using bedrails?: A Little Help needed moving to and from a bed to a chair (including a wheelchair)?: A Lot Help needed standing up from a chair using your arms (e.g., wheelchair or bedside chair)?: A Lot Help needed to walk in hospital room?: A Little Help needed climbing 3-5 steps with a railing? : A Lot 6 Click Score: 15    End of Session Equipment Utilized During Treatment: Gait belt Activity Tolerance: Patient tolerated treatment well Patient left: in chair;with nursing/sitter in room;with call bell/phone within reach Nurse Communication: Mobility status PT Visit Diagnosis: Other abnormalities of gait and mobility (R26.89);Muscle weakness (generalized) (M62.81);Difficulty in walking, not elsewhere classified (R26.2)     Time: [id number] PT Time Calculation (min) (ACUTE ONLY): 26 min  Charges:  $Gait Training: 23-37 mins                     Lorrin Goodell, [place]  Office # [phone] Pager [phone]    Lorriane Shire 07/28/2019, 9:53 AM

## 2019-07-28 NOTE — Progress Notes (Signed)
Primary Cardiologist:  Gwenlyn Found  Subjective:  No chest pain   Objective:  Vitals:   07/27/19 1129 07/27/19 1530 07/27/19 1900 07/28/19 0417  BP: 90/63 (!) 114/58 108/61 131/81  Pulse: 71 81 78 75  Resp: (!) 26 13 13  (!) 26  Temp: 97.8 F (36.6 C)  97.6 F (36.4 C) 97.7 F (36.5 C)  TempSrc: Axillary  Oral Oral  SpO2: 97% 100% 100% 96%  Weight:    74.1 kg  Height:        Intake/Output from previous day:  Intake/Output Summary (Last 24 hours) at 07/28/2019 0818 Last data filed at 07/28/2019 0440 Gross per 24 hour  Intake 356 ml  Output 2500 ml  Net -2144 ml    Physical Exam: General: Well developed, elderly, male in no acute distress Head: Eyes PERRLA, Head normocephalic and atraumatic Lungs: decreased BS bases bilaterally to auscultation. Heart: HRRR S1 S2, without rub or gallop. 2/6 murmur. 4/4 extremity pulses are 2+ & equal. JVD 10 cm Abdomen: Bowel sounds are present, abdomen soft and non-tender without masses or  hernias noted. Msk: Normal strength and tone for age. Extremities: No clubbing, cyanosis or edema.    Skin:  No rashes or lesions noted. Neuro: Alert and oriented X 3. Psych:  Good affect, responds appropriately  Lab Results: Basic Metabolic Panel: Recent Labs    07/27/19 0444 07/27/19 1105 07/28/19 0617  NA 138 134* 137  K 4.4 3.8 3.8  CL 106 106 107  CO2 22 18* 18*  GLUCOSE 119* 213* 104*  BUN 38* 43* 53*  CREATININE 2.92* 3.27* 4.12*  CALCIUM 8.3* 8.1* 7.9*  MG 2.2  --   --   PHOS  --  5.1*  --    Liver Function Tests: Recent Labs    07/27/19 1105  ALBUMIN 3.0*   No results for input(s): LIPASE, AMYLASE in the last 72 hours. CBC: Recent Labs    07/27/19 0444 07/28/19 0617  WBC 10.4 6.8  HGB 10.9* 9.7*  HCT 30.8* 27.4*  MCV 97.8 97.2  PLT 190 142*    Imaging: US RENAL  Result Date: 07/27/2019 CLINICAL DATA:  Acute renal injury. EXAM: RENAL / URINARY TRACT ULTRASOUND COMPLETE COMPARISON:  CT 07/18/2019. FINDINGS: Right  Kidney: Renal measurements: Not visualized due to bowel gas. Left Kidney: Renal measurements: [phone] cm = volume: 29.0 mL. Echogenicity within normal limits. No mass. Mild to moderate left hydronephrosis visualized. Bladder: Bladder is distended.  Patient could not void. Other: None. IMPRESSION: 1. Right kidney not visualized due to overlying bowel gas. Mild to moderate left hydronephrosis. 2.  Bladder distended patient could not void. Electronically Signed   By: [place]   On: 07/27/2019 09:01   DG Swallowing Func-Speech Pathology  Result Date: 07/26/2019 Objective Swallowing Evaluation: Type of Study: MBS-Modified Barium Swallow Study  Patient Details Name: MORGUN GLENNY MRN: [id number] Date of Birth: 05-May-1941 Today's Date: 07/26/2019 Time: SLP Start Time (ACUTE ONLY): 0940 -SLP Stop Time (ACUTE ONLY): 1010 SLP Time Calculation (min) (ACUTE ONLY): 30 min Past Medical History: Past Medical History: Diagnosis Date . Chronic back pain  . Chronic low back pain 05/19/2017 . Chronic, continuous use of opioids   for back pain . Coronary artery calcification seen on CT scan  . Nerve pain  . Thoracic aortic aneurysm [facility])  Past Surgical History: Past Surgical History: Procedure Laterality Date . BACK SURGERY    09-2016 . back surgey   . ENDOSCOPIC RETROGRADE CHOLANGIOPANCREATOGRAPHY (ERCP) WITH PROPOFOL N/A  09/25/2018  Procedure: ENDOSCOPIC RETROGRADE CHOLANGIOPANCREATOGRAPHY (ERCP) WITH PROPOFOL;  Surgeon: Ronnette Juniper, MD;  Location: [facility];  Service: Gastroenterology;  Laterality: N/A; . PANCREATIC STENT PLACEMENT  09/25/2018  Procedure: PANCREATIC STENT PLACEMENT;  Surgeon: Ronnette Juniper, MD;  Location: [facility] ENDOSCOPY;  Service: Gastroenterology;; . REMOVAL OF STONES  09/25/2018  Procedure: REMOVAL OF STONES;  Surgeon: Ronnette Juniper, MD;  Location: [facility] ENDOSCOPY;  Service: Gastroenterology;; . [name]  09/25/2018  Procedure: SPHINCTEROTOMY;  Surgeon: Ronnette Juniper, MD;  Location: [facility] ENDOSCOPY;   Service: Gastroenterology;; HPI: REFOEL SCALLION is a 78 y.o. male who presented to ED 12/1 with abd pain and AMS.  Had been seen 1 day earlier and CT abd / pelv showed cholelithiasis without cholecystitis. He does have hx of chronic low back pain and chronic opioid use. Intubated 12/4-12/7  Subjective: Pt was seen in radiology for repeat MBS to determine appropriateness for advanced consistencies. Assessment / Plan / Recommendation CHL IP CLINICAL IMPRESSIONS 07/26/2019 Clinical Impression Pt demonstrates improvement in oropharyngeal swallow when compared to MBS completed 07/21/2019. Pt cognitive impairment continues to exacerbate sensory and motor issues. Pt requires cues for small bites/sips and slow rate. Orally, pt exhibited tolerance of all consistencies. Extended oral prep of solid texture was noted, as well as missing dentition. Pharyngeally, pt exhibits delayed swallow across consistencies, with trigger at the level of the pyriform sinus on thin and nectar thick liquids, and at the vallecula on puree and solid textures. Deep flash penetration to the level of the vocal folds was seen during the swallow of thin liquids via cup, with no cough response. No penetration or aspiration seen on nectar thick liquid, puree, or solid consistencies. Will advance diet to dys 2 with nectar thick liquids, crushed meds, full supervision during po intake for assistance with self feeding. Safe swallow precautions were updated and sent with transport to post at head of the bed. SLP will continue to follow to provide education and assess diet tolerance.  SLP Visit Diagnosis Dysphagia, oropharyngeal phase (R13.12)     Impact on safety and function Moderate aspiration risk   CHL IP TREATMENT RECOMMENDATION 07/26/2019 Treatment Recommendations Therapy as outlined in treatment plan below   Prognosis 07/26/2019 Prognosis for Safe Diet Advancement Fair Barriers to Reach Goals Cognitive deficits   CHL IP DIET RECOMMENDATION  07/26/2019 SLP Diet Recommendations Dysphagia 2 (Fine chop) solids;Nectar thick liquid Liquid Administration via Cup;Straw Medication Administration Crushed with puree Compensations Slow rate;Small sips/bites Postural Changes Remain semi-upright after after feeds/meals (Comment);Seated upright at 90 degrees   CHL IP OTHER RECOMMENDATIONS 07/26/2019   Oral Care Recommendations Oral care QID Other Recommendations Order thickener from pharmacy;Have oral suction available   CHL IP FOLLOW UP RECOMMENDATIONS 07/26/2019 Follow up Recommendations Skilled Nursing facility;24 hour supervision/assistance   CHL IP FREQUENCY AND DURATION 07/26/2019 Speech Therapy Frequency (ACUTE ONLY) min 2x/week Treatment Duration 2 weeks      CHL IP ORAL PHASE 07/26/2019 Oral Phase Impaired   Oral - Mech Soft Impaired mastication;Delayed oral transit    CHL IP PHARYNGEAL PHASE 07/26/2019 Pharyngeal Phase Impaired   Pharyngeal- Nectar Teaspoon Delayed swallow initiation-pyriform sinuses   Pharyngeal- Thin Cup Delayed swallow initiation-pyriform sinuses;Reduced airway/laryngeal closure;Penetration/Aspiration during swallow Pharyngeal Material enters airway, CONTACTS cords and not ejected out   Pharyngeal- Puree Delayed swallow initiation-vallecula   Pharyngeal- Mechanical Soft Delayed swallow initiation-vallecula  CHL IP CERVICAL ESOPHAGEAL PHASE 07/26/2019 Cervical Esophageal Phase [facility] Enriqueta Shutter, [place], CCC-SLP Speech Language Pathologist Office: [phone] Pager: [phone] Shonna Chock 07/26/2019, 10:28 AM  Cardiac Studies:  ECG: SR rate 100 lateral T wave inversions    Telemetry: SR PaC  Echo: EF 30-35% 07/14/19   1. Technically difficult study. Left ventricular ejection fraction, by visual estimation, is 30 to 35%. The left ventricle has grossly severely decreased function. Images are not sufficient to assess for regional wall motion abnormalities. There is  mildly increased left ventricular hypertrophy.  2.  Left ventricular diastolic parameters are consistent with Grade I diastolic dysfunction (impaired relaxation).  3. Global right ventricle has normal systolic function.The right ventricular size is normal.  4. The mitral valve is normal in structure. No evidence of mitral valve regurgitation.  5. The tricuspid valve is not well visualized. Tricuspid valve regurgitation is not demonstrated.  6. The aortic valve was not well visualized. Aortic valve regurgitation is not visualized.  7. The pulmonic valve was not well visualized. Pulmonic valve regurgitation is not visualized.    Medications:   . aspirin  81 mg Oral Daily  . atorvastatin  40 mg Oral q1800  . bisacodyl  5 mg Oral BID  . carvedilol  12.5 mg Oral BID WC  . Chlorhexidine Gluconate Cloth  6 each Topical Daily  . cloNIDine  0.1 mg Transdermal Weekly  . enoxaparin (LOVENOX) injection  30 mg Subcutaneous Q24H  . finasteride  5 mg Oral Daily  . lidocaine  1 application Urethral Once  . mouth rinse  15 mL Mouth Rinse BID  . pantoprazole  40 mg Oral QHS  . polyethylene glycol  17 g Oral Daily  . tamsulosin  0.4 mg Oral Daily    Patient Profile:    78 y.o. male with PMH including but not limited to thoracic aortic aneurysm, CAD, chronic back pain, chronic opioid use  presented to [facility] ED 12/1 with altered mental status and abd pain. SEMI by Noe Gens, EF now 30-35%  Assessment/Plan:   1. SEMI: - no angina no acute ECG changes currently not a candidate for cath with MS Changes and acute renal failure will risk stratify as outpatient  - pt w/ lat ECG changes, EF 30-35%  2. DCM: aldactone/ACE held continue ASA/statin and beta blocker   3.ARF:  Had urinary retention yesterday but Cr not improved suggest renal consult nephrotoxic drugs held has prostate issues on flomax and proscar    Jenkins Rouge 07/28/2019, 8:18 AM

## 2019-07-29 LAB — GLUCOSE, CAPILLARY
Glucose-Capillary: 98 mg/dL (ref 70–99)
Glucose-Capillary: 84 mg/dL (ref 70–99)
Glucose-Capillary: 138 mg/dL — ABNORMAL HIGH (ref 70–99)
Glucose-Capillary: 103 mg/dL — ABNORMAL HIGH (ref 70–99)

## 2019-07-29 LAB — RENAL FUNCTION PANEL
Albumin: 2.6 g/dL — ABNORMAL LOW (ref 3.5–5.0)
Anion gap: 12 (ref 5–15)
BUN: 57 mg/dL — ABNORMAL HIGH (ref 8–23)
CO2: 23 mmol/L (ref 22–32)
Calcium: 7.9 mg/dL — ABNORMAL LOW (ref 8.9–10.3)
Chloride: 107 mmol/L (ref 98–111)
Creatinine, Ser: 4.36 mg/dL — ABNORMAL HIGH (ref 0.61–1.24)
GFR calc Af Amer: 14 mL/min — ABNORMAL LOW (ref >60)
GFR calc non Af Amer: 12 mL/min — ABNORMAL LOW (ref >60)
Glucose, Bld: 110 mg/dL — ABNORMAL HIGH (ref 70–99)
Phosphorus: 5.6 mg/dL — ABNORMAL HIGH (ref 2.5–4.6)
Potassium: 4 mmol/L (ref 3.5–5.1)
Sodium: 142 mmol/L (ref 135–145)

## 2019-07-29 MED ORDER — SODIUM CHLORIDE 0.9 % IV SOLN: INTRAVENOUS | Status: AC

## 2019-07-29 MED ORDER — TRAZODONE HCL 50 MG PO TABS
50.0000 mg | ORAL_TABLET | Freq: Every evening | ORAL | Status: DC | PRN
  Administered 2019-07-29 – 2019-08-01 (×3): 50 mg via ORAL
  Filled 2019-07-29 (×3): qty 1

## 2019-07-29 NOTE — Progress Notes (Signed)
[name] NEPHROLOGY PROGRESS NOTE  Assessment/ Plan: Pt is a 78 y.o. yo male   with history of CAD, chronic back pain, thoracic aortic aneurysm who was initially admitted on 12/1 for AMS and abdominal pain due to cholecystitis, seen as a consultation for AKI.  #Acute kidney injury, nonoliguric likely obstructive uropathy and may have some hemodynamic changes. -UA likely traumatic.  Kidney US consistent with hydronephrosis. -Creatinine level mildly trending up, nonoliguric.  Plan for gentle hydration for only 10 hours. -Continue indwelling Foley catheter. -Strict ins and out and monitor BMP. Hopefully the creatinine level will plateau and has renal recovery.  No indication for dialysis.  Do not think BUN is causing is AMS.  #Acute urinary retention/hydronephrosis: On Flomax, finasteride and has Foley catheter.  Seen by urologist.  #Metabolic acidosis: Start sodium bicarbonate p.o.  #Hypertension: Blood pressure acceptable.  On carvedilol.  #Acute metabolic encephalopathy: Monitor mental status.  Per primary team.  #Acute respiratory failure with hypoxia: He required mechanical ventilation, now on room air.  #NSTEMI: Seen by cardiology.  Planned for outpatient follow-up for possible cardiac cath.  Subjective: Seen and examined at bedside.  Patient was pleasant but little confused.  Denies nausea vomiting chest pain shortness of breath.  Urine output is recorded as 1450 cc.   Objective Vital signs in last 24 hours: Vitals:   07/28/19 2326 07/29/19 0311 07/29/19 0700 07/29/19 0937  BP: 135/82 (!) 139/92 134/90 139/79  Pulse: 65 95 81 79  Resp: 16 (!) 23 18   Temp: (!) 97.5 F (36.4 C) (!) 97.4 F (36.3 C) 97.6 F (36.4 C)   TempSrc: Oral Oral Oral   SpO2: 100% 90% 91%   Weight:  72.9 kg    Height:       Weight change: -1.207 kg  Intake/Output Summary (Last 24 hours) at 07/29/2019 1053 Last data filed at 07/29/2019 0315 Gross per 24 hour  Intake 600 ml   Output 1050 ml  Net -450 ml       Labs: Basic Metabolic Panel: Recent Labs  Lab 07/23/19 0554 07/27/19 1105 07/28/19 0617 07/29/19 0409  NA 140 134* 137 142  K 2.8* 3.8 3.8 4.0  CL 107 106 107 107  CO2 21* 18* 18* 23  GLUCOSE 130* 213* 104* 110*  BUN 13 43* 53* 57*  CREATININE 0.63 3.27* 4.12* 4.36*  CALCIUM 8.5* 8.1* 7.9* 7.9*  PHOS 2.5 5.1*  --  5.6*   Liver Function Tests: Recent Labs  Lab 07/23/19 0554 07/24/19 0336 07/27/19 1105 07/29/19 0409  AST 24 26  --   --   ALT 21 24  --   --   ALKPHOS 56 66  --   --   BILITOT 1.3* 1.5*  --   --   PROT 5.5* 6.1*  --   --   ALBUMIN 3.1* 3.5 3.0* 2.6*   No results for input(s): LIPASE, AMYLASE in the last 168 hours. No results for input(s): AMMONIA in the last 168 hours. CBC: Recent Labs  Lab 07/23/19 0554 07/24/19 0336 07/27/19 0444 07/28/19 0617  WBC 6.8 13.6* 10.4 6.8  HGB 12.8* 14.2 10.9* 9.7*  HCT 35.6* 39.4 30.8* 27.4*  MCV 93.7 94.0 97.8 97.2  PLT 216 329 190 142*   Cardiac Enzymes: No results for input(s): CKTOTAL, CKMB, CKMBINDEX, TROPONINI in the last 168 hours. CBG: Recent Labs  Lab 07/28/19 0758 07/28/19 1153 07/28/19 1632 07/28/19 1949 07/29/19 0727  GLUCAP 99 91 123* 116* 98  Iron Studies: No results for input(s): IRON, TIBC, TRANSFERRIN, FERRITIN in the last 72 hours. Studies/Results: No results found.  Medications: Infusions: . sodium chloride      Scheduled Medications: . aspirin  81 mg Oral Daily  . atorvastatin  40 mg Oral q1800  . bisacodyl  5 mg Oral BID  . carvedilol  12.5 mg Oral BID WC  . Chlorhexidine Gluconate Cloth  6 each Topical Daily  . cloNIDine  0.1 mg Transdermal Weekly  . enoxaparin (LOVENOX) injection  30 mg Subcutaneous Q24H  . finasteride  5 mg Oral Daily  . lidocaine  1 application Urethral Once  . mouth rinse  15 mL Mouth Rinse BID  . pantoprazole  40 mg Oral QHS  . polyethylene glycol  17 g Oral Daily  . sodium bicarbonate  1,300 mg Oral BID   . tamsulosin  0.4 mg Oral Daily    have reviewed scheduled and prn medications.  Physical Exam: General:NAD, comfortable Heart:RRR, s1s2 nl Lungs:clear b/l, no crackle Abdomen:soft, Non-tender, non-distended Extremities:No LE edema   Riley Eaton 07/29/2019,10:53 AM  LOS: 16 days  Pager: ID:[phone]

## 2019-07-29 NOTE — Progress Notes (Signed)
PROGRESS NOTE  ORLANDA MANUEL [id number] DOB: November 06, 1940 DOA: 07/13/2019 PCP: Aletha Halim., PA-C  Brief History   Jadd R Lawsonis a 78 y.o.malewith PMH including but not limited to thoracic aortic aneurysm, CAD, chronic back pain, chronic opioid use presented to [facility] ED 12/1 with altered mental status and abd pain. He had been seen in [facility] ED 1 day prior for abd pain. Patient had CT of the abd at the time that demonstrated cholelithiasis without cholecystitis. Surgery was consulted and recommended outpatient follow up. He was subsequently discharged home.  On 12/1 patient had worsening of pain along with AMS; therefore, came to [facility] ED. He was quite agitated per reports and received 1mg  dilaudid, 5mg  haldol, 1mg  ativan x 3 and had minimal response. PCCM was asked to see in consultation for consideration of precedex. Per chart review, he has no underlying hx of EtOH abuse or substance abuse. He does have hx of chronic low back pain and chronic opioid use. On review of outpatient med list, he had 5-325 norco prescribed for q6hrs PRN, 40mg  oxycontin TID, lyrica 200mg  TID, duloxetine 60mg  BID.  Per chart review, he had admission 09/24/18 through 10/06/18 for acute ascending cholangitis s/p ERCP and sphincterotomy with stenting of pancreatic duct (Dr. Therisa Doyne). During that admission, he did have severe delirium felt to be due to narcotic withdrawal. He did spend a few days in ICU and was briefly treated with precedex and fentanyl. He was discharged and supposed to be evaluated by surgery as an outpatient for cholecystectomy; however, this was postponed due to the [place] pandemic. Per discharge summary, his medication regimen was adjusted prior to d/c and pt had been tolerating it well (oxycodone weaned from 80mg  BID to BID, lyrica reduced from 200mg  TID to 100mg  TID, cymbalta from 60mg  BID to 60mg  daily).  Patient found to have acute metabolic encephalopathy, unknown cause.  Is  also noted to have acute respiratory failure and required intubation for airway protection.  Currently he is on room air.  There were complaints of abdominal pain however HIDA scan was unremarkable.  Patient also noted to have NSTEMI with elevated troponins over 2000.  Cardiology consulted, pending heart catheterization possibly later this week vs as an outpatient given his current renal failure and mental status. Nephrology has been consulted.  Plan is for the patient to go to inpatient rehab once work up is completed.  Consultants  . Cardiology . Nephrology . Gastroenterology . General Surgery . Interventional Radiation . PCCM  Procedures  . MRCP . Mechanical ventilation  Antibiotics   Anti-infectives (From admission, onward)   Start     Dose/Rate Route Frequency Ordered Stop   07/21/19 0100  vancomycin (VANCOCIN) 1,500 mg in sodium chloride 0.9 % 500 mL IVPB     1,500 mg 250 mL/hr over 120 Minutes Intravenous Every 12 hours 07/20/19 1608 07/26/19 1437   07/20/19 1018  cefTRIAXone (ROCEPHIN) 2 g in sodium chloride 0.9 % 100 mL IVPB  Status:  Discontinued     2 g 200 mL/hr over 30 Minutes Intravenous Every 24 hours 07/19/19 1115 07/20/19 0800   07/20/19 1018  cefTRIAXone (ROCEPHIN) 2 g in sodium chloride 0.9 % 100 mL IVPB  Status:  Discontinued     2 g 200 mL/hr over 30 Minutes Intravenous Every 24 hours 07/20/19 0800 07/20/19 0921   07/17/19 0100  vancomycin (VANCOCIN) IVPB 750 mg/150 ml premix  Status:  Discontinued     750 mg 150 mL/hr over 60 Minutes Intravenous  Every 12 hours 07/16/19 1240 07/20/19 1608   07/16/19 1400  acyclovir (ZOVIRAX) 700 mg in dextrose 5 % 100 mL IVPB  Status:  Discontinued     10 mg/kg  69.9 kg 114 mL/hr over 60 Minutes Intravenous Every 8 hours 07/16/19 1240 07/19/19 1115   07/16/19 1245  cefTRIAXone (ROCEPHIN) 2 g in sodium chloride 0.9 % 100 mL IVPB  Status:  Discontinued     2 g 200 mL/hr over 30 Minutes Intravenous Every 12 hours 07/16/19 1240  07/19/19 1115   07/16/19 1245  vancomycin (VANCOCIN) 1,500 mg in sodium chloride 0.9 % 500 mL IVPB     1,500 mg 250 mL/hr over 120 Minutes Intravenous  Once 07/16/19 1240 07/16/19 1609   07/16/19 1245  ampicillin (OMNIPEN) 2 g in sodium chloride 0.9 % 100 mL IVPB  Status:  Discontinued     2 g 300 mL/hr over 20 Minutes Intravenous Every 4 hours 07/16/19 1240 07/19/19 1115   07/13/19 1900  cefTRIAXone (ROCEPHIN) 1 g in sodium chloride 0.9 % 100 mL IVPB  Status:  Discontinued     1 g 200 mL/hr over 30 Minutes Intravenous Every 24 hours 07/13/19 1825 07/16/19 1240   07/13/19 1900  metroNIDAZOLE (FLAGYL) IVPB 500 mg  Status:  Discontinued     500 mg 100 mL/hr over 60 Minutes Intravenous Every 8 hours 07/13/19 1825 07/20/19 0921     Subjective  The patient is sitting up in a chair at bedside. No new complaints.  Objective   Vitals:  Vitals:   07/29/19 0937 07/29/19 1100  BP: 139/79 132/75  Pulse: 79 70  Resp:    Temp:  97.6 F (36.4 C)  SpO2:  91%   Exam:  Constitutional:  . The patient is awake, alert, and oriented x 3. He is fairly agitated. Respiratory:  . No increased work of breathing. . No wheezes, rales, or rhonchi . No tactile fremitus Cardiovascular:  . Regular rate and rhythm . No murmurs, ectopy, or gallups. . No lateral PMI. No thrills. Abdomen:  . Abdomen is soft, non-tender, non-distended . No hernias, masses, or organomegaly . Normoactive bowel sounds.  Musculoskeletal:  . No cyanosis, clubbing, or edema Skin:  . No rashes, lesions, ulcers . palpation of skin: no induration or nodules Neurologic:  . CN 2-12 intact . Sensation all 4 extremities intact . Moving all extremities Psychiatric:  . Mental status: Pt is agitated.    . judgment and insight not intact  I have personally reviewed the following:   Today's Data  . Vitals . BMP . CBC  Micro Data  . MRSA by PCR positive 07/19/2019.  Scheduled Meds: . aspirin  81 mg Oral Daily  .  atorvastatin  40 mg Oral q1800  . bisacodyl  5 mg Oral BID  . carvedilol  12.5 mg Oral BID WC  . Chlorhexidine Gluconate Cloth  6 each Topical Daily  . cloNIDine  0.1 mg Transdermal Weekly  . enoxaparin (LOVENOX) injection  30 mg Subcutaneous Q24H  . finasteride  5 mg Oral Daily  . lidocaine  1 application Urethral Once  . mouth rinse  15 mL Mouth Rinse BID  . pantoprazole  40 mg Oral QHS  . polyethylene glycol  17 g Oral Daily  . sodium bicarbonate  1,300 mg Oral BID  . tamsulosin  0.4 mg Oral Daily   Continuous Infusions: . sodium chloride      Active Problems:   Chronic low back pain   Choledocholithiasis  Acute metabolic encephalopathy   Mental status alteration   ACS (acute coronary syndrome) (HCC)   Endotracheal tube present   Alteration in nutrition   Encephalopathy   Abdominal pain   Non-ST elevation (NSTEMI) myocardial infarction (HCC)   Hypokalemia   DCM (dilated cardiomyopathy) ([place])   LOS: 16 days   A & P  Acute metabolic encephalopathy: Much improved from previous. Not sure what baseline mental status is for this gentleman. He almost seems a little manic now. Previous work up of his mental status included CT head/MRI, lactate, procalcitonin, TSH ammonium levels. Blood cultures have had no growth. He is on chronic oxycodone for chronic pain. He continues to require a Actuary. Considering psychiatric consult, although he seems a little calmer today.  Acute kidney injury and urinary retention: Creatinine 4.12 today. His baseline appears to be 1.7. Nephrology consulted as creatinine continues to rise.Losartan and spironolactone have been held. Renal ultrasound demonstrated mild to moderate hydronephrosis on the left. ; demonstrated. Urology has been consulted as well following a failed attempt to place a foley catheter following bladder scan with 999 of urine. 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 Coude catheter finally placed by catheter team with 1.8 L of  urine out. Cardiology had considered a possible LHC later this week, at this point they feel that he is a poor candidate for LHC. Possibly for outpatient after discharge.  Acute respiratory failure: Patient was intubated for airway protection. He also had aspiration pneumonia. Sputum culture was positive for MRSA. He had 10 days of IV vancomycin.  The patient is currently saturating at 98% on room air.   Abdominal pain: No current complaints of abdominal pain. The patient is s/p ERCP with sphincterotomy and pancreatic duct stenting in 09/25/2018. CT abdomen performed on this admission demonstrated cholelithiasis with no active disease. HIDA scan suggested chronic cholecystitis, no evidence for acute cholecystitis, and a patent cystic duct. LFT's are now within normal limits.   Prolonged QT: Avoid offending agents. Monitor potassium and magnesium.  Elevated troponin/NSTEMI: Troponin peaked at 2110 on 07/15/2019. EKG on 07/16/2019 showed deep T wave inversions in the inferior and anterior lateral leads. Echocardiogram 07/14/2019 shows an EF of 30 to 35%, mildly increased LV hypertrophy.  Grade 1 diastolic dysfunction. Cardiology consulted and appreciated. Patient noted to have coronary calcification on CT scan back in February 2020, but he has no current complaints of chest pain. Cardiology is considering LHC later this week, if renal insufficiency improves. Nephrology has been consulted.   Dilated cardiomyopathy/acute systolic heart failure: Echocardiogram demonstrated EF of 30-35% with Grade 1 diastolic dysfunction and mildly increased LV hypertrophy. He is continued on coreg and telemetry monitoring.  Essential hypertension: Blood pressures remain labile on Coreg. Well controlled this morning. High this afternoon. Losartan, spironolactone held. Will increase dose of coreg.  Hypokalemia: Resolved. Monitor.  Dysphagia/poor oral intake: Pt is obsessed with liberating his diet. SLP has advanced him  to a mechanical soft diet.   Deconditioning/debility: Plan is for the patient to go to CIR when work up is completed and bed is available.  I have seen and examined this patient myself. I have spent 32 minutes in his evaluation and care.  DVT Prophylaxis: Lovenox Code Status: Full Family Communication: None at bedside Disposition Plan: Admitted.  Patient now with urinary retention and acute kidney injury.  Disposition CIR when medically clear and bed is available.   Jarriel Papillion, DO [facility] Direct contact: see [email or web address]  7PM-7AM contact night coverage  as above 07/29/2019, 5:39 PM  LOS: 15 days

## 2019-07-29 NOTE — Progress Notes (Addendum)
Pt refuses to lay in the bed. He is sitting on the side of the bed. Educated pt that he is a fall risk and for his safety it would be best to lie down. Pt continues to refuse. Bed alarm is set, floor mats are in place and pt refuses to wear non-skid socks.

## 2019-07-29 NOTE — Progress Notes (Signed)
Occupational Therapy Treatment Patient Details Name: Riley Eaton MRN: [id number] DOB: 05-Feb-1941 Today's Date: 07/29/2019    History of present illness Riley Eaton is a 78 y.o. male who presented to ED 12/1 with abd pain and AMS.  Had been seen 1 day earlier and CT abd / pelv showed cholelithiasis without cholecystitis. Intubated 12/4-12/7. PHMx: Thoracic aortic aneurysm; Chronic low back pain; Choledocholithiasis; Chronic pain; Acute metabolic encephalopathy; ACS. 12/3 CT head: Significant motion limitations. No evidence of acute abnormality   OT comments  Patient eager to participate in therapy, perseverating on when he will be able to go to "the 4th floor." Min cues to redirect. Patient seated at edge of bed upon arrival, min guard for safety to don socks. Max verbal and tactile cues for body mechanics for sit to stand. Patient participate in functional ambulation using rolling walker with min A for safety. Patient has kyphotic posture and requires min A to safely navigate. Pt attempts to turn into nurses station requiring verbal cues to redirect. Patient return to room, mod cues to body mechanics upon sitting. Will continue acute OT patient to address self care and cognitive goals, patient is highly motivated to participate.    Follow Up Recommendations  CIR;Supervision/Assistance - 24 hour    Equipment Recommendations  Other (comment)(to be determined)       Precautions / Restrictions Precautions Precautions: Fall Precaution Comments: HOH Restrictions Weight Bearing Restrictions: No       Mobility Bed Mobility               General bed mobility comments: seated edge of bed upon arrival  Transfers Overall transfer level: Needs assistance Equipment used: Rolling walker (2 wheeled) Transfers: Sit to/from Stand Sit to Stand: Min assist         General transfer comment: cues for hand placement and sequencing, increased time to stabilize initial balance     Balance Overall balance assessment: Needs assistance Sitting-balance support: No upper extremity supported;Feet supported Sitting balance-Leahy Scale: Good Sitting balance - Comments: supervision for sitting at edge of bed   Standing balance support: Bilateral upper extremity supported;During functional activity Standing balance-Leahy Scale: Poor Standing balance comment: reliant on external support, min A for safety                           ADL either performed or assessed with clinical judgement   ADL Overall ADL's : Needs assistance/impaired                     Lower Body Dressing: Min guard;Sitting/lateral leans Lower Body Dressing Details (indicate cue type and reason): to don bilateral socks seated at edge of bed  Toilet Transfer: Minimal assistance;Ambulation;RW Toilet Transfer Details (indicate cue type and reason): simulated via functional mobility and sit to stand at edge of bed, max verbal + tactile cues for body mechanics          Functional mobility during ADLs: Minimal assistance;Rolling walker;Cueing for safety;Cueing for sequencing                 Cognition Arousal/Alertness: Awake/alert Behavior During Therapy: Impulsive Overall Cognitive Status: Impaired/Different from baseline Area of Impairment: Attention;Memory;Following commands;Safety/judgement                   Current Attention Level: Focused Memory: Decreased short-term memory Following Commands: Follows one step commands with increased time Safety/Judgement: Decreased awareness of safety;Decreased awareness of deficits  General Comments: easily distracted, stands impulsively before OT ready with equipment, attempts to ambulate into nurses station requiring cues to redirect                   Pertinent Vitals/ Pain       Pain Assessment: No/denies pain         Frequency  Min 2X/week        Progress Toward Goals  OT Goals(current goals can now  be found in the care plan section)  Progress towards OT goals: Progressing toward goals  Acute Rehab OT Goals Patient Stated Goal: "to get moving" OT Goal Formulation: With patient Time For Goal Achievement: 08/12/19 Potential to Achieve Goals: Good ADL Goals Pt Will Perform Grooming: with supervision;sitting;standing Pt Will Perform Upper Body Bathing: with supervision;sitting;standing Pt Will Perform Lower Body Bathing: with min assist;sit to/from stand Pt Will Perform Upper Body Dressing: with supervision;sitting;standing Pt Will Perform Lower Body Dressing: with min assist;sit to/from stand Pt Will Transfer to Toilet: with supervision;ambulating;regular height toilet;bedside commode;grab bars Pt Will Perform Toileting - Clothing Manipulation and hygiene: with min assist;sit to/from stand Pt/caregiver will Perform Home Exercise Program: Increased strength;Increased ROM;Right Upper extremity;With written HEP provided Additional ADL Goal #1: Pt will be able to locate items asked of him without VCs Additional ADL Goal #2: Pt will be min guard A in and OOB for basic ADLs  Plan Discharge plan remains appropriate       AM-PAC OT "6 Clicks" Daily Activity     Outcome Measure   Help from another person eating meals?: None Help from another person taking care of personal grooming?: A Little Help from another person toileting, which includes using toliet, bedpan, or urinal?: A Little Help from another person bathing (including washing, rinsing, drying)?: A Little Help from another person to put on and taking off regular upper body clothing?: A Little Help from another person to put on and taking off regular lower body clothing?: A Little 6 Click Score: 19    End of Session Equipment Utilized During Treatment: Gait belt;Rolling walker  OT Visit Diagnosis: Unsteadiness on feet (R26.81);Other abnormalities of gait and mobility (R26.89);Muscle weakness (generalized) (M62.81);Low vision, both  eyes (H54.2);Other symptoms and signs involving cognitive function;Hemiplegia and hemiparesis Hemiplegia - Right/Left: Right Hemiplegia - dominant/non-dominant: Dominant Hemiplegia - caused by: Unspecified   Activity Tolerance Patient tolerated treatment well   Patient Left in bed;with call bell/phone within reach;with nursing/sitter in room   Nurse Communication Mobility status        Time: [id number] OT Time Calculation (min): 19 min  Charges: OT General Charges $OT Visit: 1 Visit OT Treatments $Self Care/Home Management : 8-22 mins  Shon Millet OT OT office: [place] 07/29/2019, 11:08 AM

## 2019-07-29 NOTE — Progress Notes (Signed)
Primary Cardiologist:  Gwenlyn Found  Subjective:  No chest pain   Objective:  Vitals:   07/28/19 1704 07/28/19 1951 07/28/19 2326 07/29/19 0311  BP: (!) 152/101 111/73 135/82 (!) 139/92  Pulse: 83 73 65 95  Resp:  16 16 (!) 23  Temp:  (!) 97.5 F (36.4 C) (!) 97.5 F (36.4 C) (!) 97.4 F (36.3 C)  TempSrc:  Oral Oral Oral  SpO2:  99% 100% 90%  Weight:    72.9 kg  Height:        Intake/Output from previous day:  Intake/Output Summary (Last 24 hours) at 07/29/2019 0745 Last data filed at 07/29/2019 0315 Gross per 24 hour  Intake 1190 ml  Output 1450 ml  Net -260 ml    Physical Exam: General: Well developed, elderly, male in no acute distress Head: Eyes PERRLA, Head normocephalic and atraumatic Lungs: decreased BS bases bilaterally to auscultation. Heart: HRRR S1 S2, without rub or gallop. 2/6 murmur. 4/4 extremity pulses are 2+ & equal. JVD 10 cm Abdomen: Bowel sounds are present, abdomen soft and non-tender without masses or  hernias noted. Msk: Normal strength and tone for age. Extremities: No clubbing, cyanosis or edema.    Skin:  No rashes or lesions noted. Neuro: Alert and oriented X 3. Psych:  Good affect, responds appropriately  Lab Results: Basic Metabolic Panel: Recent Labs    07/27/19 0444 07/27/19 1105 07/28/19 0617 07/29/19 0409  NA 138 134* 137 142  K 4.4 3.8 3.8 4.0  CL 106 106 107 107  CO2 22 18* 18* 23  GLUCOSE 119* 213* 104* 110*  BUN 38* 43* 53* 57*  CREATININE 2.92* 3.27* 4.12* 4.36*  CALCIUM 8.3* 8.1* 7.9* 7.9*  MG 2.2  --   --   --   PHOS  --  5.1*  --  5.6*   Liver Function Tests: Recent Labs    07/27/19 1105 07/29/19 0409  ALBUMIN 3.0* 2.6*   No results for input(s): LIPASE, AMYLASE in the last 72 hours. CBC: Recent Labs    07/27/19 0444 07/28/19 0617  WBC 10.4 6.8  HGB 10.9* 9.7*  HCT 30.8* 27.4*  MCV 97.8 97.2  PLT 190 142*    Imaging: US RENAL  Result Date: 07/27/2019 CLINICAL DATA:  Acute renal injury. EXAM:  RENAL / URINARY TRACT ULTRASOUND COMPLETE COMPARISON:  CT 07/18/2019. FINDINGS: Right Kidney: Renal measurements: Not visualized due to bowel gas. Left Kidney: Renal measurements: [phone] cm = volume: 29.0 mL. Echogenicity within normal limits. No mass. Mild to moderate left hydronephrosis visualized. Bladder: Bladder is distended.  Patient could not void. Other: None. IMPRESSION: 1. Right kidney not visualized due to overlying bowel gas. Mild to moderate left hydronephrosis. 2.  Bladder distended patient could not void. Electronically Signed   By: Marcello Moores  Register   On: 07/27/2019 09:01    Cardiac Studies:  ECG: SR rate 100 lateral T wave inversions    Telemetry: SR PaC  Echo: EF 30-35% 07/14/19   1. Technically difficult study. Left ventricular ejection fraction, by visual estimation, is 30 to 35%. The left ventricle has grossly severely decreased function. Images are not sufficient to assess for regional wall motion abnormalities. There is  mildly increased left ventricular hypertrophy.  2. Left ventricular diastolic parameters are consistent with Grade I diastolic dysfunction (impaired relaxation).  3. Global right ventricle has normal systolic function.The right ventricular size is normal.  4. The mitral valve is normal in structure. No evidence of mitral valve regurgitation.  5.  The tricuspid valve is not well visualized. Tricuspid valve regurgitation is not demonstrated.  6. The aortic valve was not well visualized. Aortic valve regurgitation is not visualized.  7. The pulmonic valve was not well visualized. Pulmonic valve regurgitation is not visualized.    Medications:   . aspirin  81 mg Oral Daily  . atorvastatin  40 mg Oral q1800  . bisacodyl  5 mg Oral BID  . carvedilol  12.5 mg Oral BID WC  . Chlorhexidine Gluconate Cloth  6 each Topical Daily  . cloNIDine  0.1 mg Transdermal Weekly  . enoxaparin (LOVENOX) injection  30 mg Subcutaneous Q24H  . finasteride  5 mg Oral Daily    . lidocaine  1 application Urethral Once  . mouth rinse  15 mL Mouth Rinse BID  . pantoprazole  40 mg Oral QHS  . polyethylene glycol  17 g Oral Daily  . sodium bicarbonate  1,300 mg Oral BID  . tamsulosin  0.4 mg Oral Daily    Patient Profile:    78 y.o. male with PMH including  thoracic aortic aneurysm, CAD, chronic back pain, chronic opioid use  presented to [facility] ED 12/1 with altered mental status and abd pain. SEMI by enzymes , EF now 30-35%  Assessment/Plan:   1. SEMI: - no angina no acute ECG changes currently not a candidate for cath with MS Changes and acute renal failure will risk stratify as outpatient   2. DCM: aldactone/ACE held continue ASA/statin and beta blocker   3.ARF:  Had urinary retention  but Cr not improved after foley  Cr up to 4.36  nephrotoxic drugs held has prostate issues on flomax and proscar Not a cath candidate   4. MS:  Not clear what his baseline is not wanting to lay in bed this am nurse has set alarms    Jenkins Rouge 07/29/2019, 7:45 AM

## 2019-07-29 NOTE — Progress Notes (Signed)
Nutrition Follow-up  INTERVENTION:   -Magic cup TID with meals, each supplement provides 290 kcal and 9 grams of protein  NUTRITION DIAGNOSIS:   Increased nutrient needs related to acute illness as evidenced by estimated needs.  Ongoing.  GOAL:   Patient will meet greater than or equal to 90% of their needs  Progressing.  MONITOR:   PO intake, Supplement acceptance, Labs, Weight trends  ASSESSMENT:   Patient with PMH significant for chronic low back pain, chronic opoid abuse, PCM, CAD, and thoracic aortic aneurysm. Presents this admission with AMS from unclear etiology and chololithiasis.  12/01 Admit 12/04 Rapid Response, intubated 12/06 CT abdomen with moderate size hiatal hernia containing a portion of the stomach. Enteric tube with tip in the distal esophagus close to the GE junction within the hiatal hernia; trickle TF started 12/07 Extubated 12/09 Diet advanced to Dysphagia 1, Honey thick 12/14 s/p MBS -diet advanced to Dysphagia 2, nectar thick 12/16 SLP advanced diet to Dysphagia 3 diet, nectar thick  Patient consumed 100% of meals on 12/16. Pt tolerating upgraded dysphagia diet.   Admission weight: 154 lbs. Current weight: 160 lbs.  I/Os: +4.3L since 12/3 UOP: 1500 ml x 24 hrs  Medications reviewed. Labs reviewed: CBGs: 84-98 Phos 5.6  Diet Order:   Diet Order            DIET DYS 3 Room service appropriate? Yes; Fluid consistency: Nectar Thick  Diet effective now              EDUCATION NEEDS:   Not appropriate for education at this time  Skin:  Skin Assessment: Reviewed RN Assessment  Last BM:  12/17- type 6  Height:   Ht Readings from Last 1 Encounters:  07/16/19 5\' 7"  (1.702 m)    Weight:   Wt Readings from Last 1 Encounters:  07/29/19 72.9 kg    Ideal Body Weight:  67.3 kg  BMI:  Body mass index is 25.17 kg/m.  Estimated Nutritional Needs:   Kcal:  1950-2150 kcals  Protein:  98-108 g  Fluid:  >/= 1.7 L   Clayton Bibles, MS, RD, LDN Inpatient Clinical Dietitian Pager: [phone] After Hours Pager: [phone]

## 2019-07-30 LAB — GLUCOSE, CAPILLARY
Glucose-Capillary: 100 mg/dL — ABNORMAL HIGH (ref 70–99)
Glucose-Capillary: 115 mg/dL — ABNORMAL HIGH (ref 70–99)
Glucose-Capillary: 129 mg/dL — ABNORMAL HIGH (ref 70–99)
Glucose-Capillary: 160 mg/dL — ABNORMAL HIGH (ref 70–99)
Glucose-Capillary: 198 mg/dL — ABNORMAL HIGH (ref 70–99)
Glucose-Capillary: 95 mg/dL (ref 70–99)

## 2019-07-30 LAB — RENAL FUNCTION PANEL
Albumin: 3 g/dL — ABNORMAL LOW (ref 3.5–5.0)
Anion gap: 12 (ref 5–15)
BUN: 57 mg/dL — ABNORMAL HIGH (ref 8–23)
CO2: 23 mmol/L (ref 22–32)
Calcium: 8.3 mg/dL — ABNORMAL LOW (ref 8.9–10.3)
Chloride: 106 mmol/L (ref 98–111)
Creatinine, Ser: 4.36 mg/dL — ABNORMAL HIGH (ref 0.61–1.24)
GFR calc Af Amer: 14 mL/min — ABNORMAL LOW (ref >60)
GFR calc non Af Amer: 12 mL/min — ABNORMAL LOW (ref >60)
Glucose, Bld: 111 mg/dL — ABNORMAL HIGH (ref 70–99)
Phosphorus: 5.1 mg/dL — ABNORMAL HIGH (ref 2.5–4.6)
Potassium: 4.3 mmol/L (ref 3.5–5.1)
Sodium: 141 mmol/L (ref 135–145)

## 2019-07-30 NOTE — Progress Notes (Signed)
Progress Note  Patient Name: MAHLIK HLADKY Date of Encounter: 07/30/2019  Primary Cardiologist: Dr. Gwenlyn Found, MD   Subjective   No cardiac complaints still confused   Inpatient Medications    Scheduled Meds: . aspirin  81 mg Oral Daily  . atorvastatin  40 mg Oral q1800  . bisacodyl  5 mg Oral BID  . carvedilol  12.5 mg Oral BID WC  . Chlorhexidine Gluconate Cloth  6 each Topical Daily  . cloNIDine  0.1 mg Transdermal Weekly  . enoxaparin (LOVENOX) injection  30 mg Subcutaneous Q24H  . finasteride  5 mg Oral Daily  . lidocaine  1 application Urethral Once  . mouth rinse  15 mL Mouth Rinse BID  . pantoprazole  40 mg Oral QHS  . polyethylene glycol  17 g Oral Daily  . sodium bicarbonate  1,300 mg Oral BID  . tamsulosin  0.4 mg Oral Daily   Continuous Infusions:  PRN Meds: acetaminophen (TYLENOL) oral liquid 160 mg/5 mL, acetaminophen, docusate, haloperidol lactate, hydrALAZINE, LORazepam, morphine injection, naLOXone (NARCAN)  injection, ondansetron (ZOFRAN) IV, Resource ThickenUp Clear, traZODone   Vital Signs    Vitals:   07/29/19 1839 07/29/19 1943 07/30/19 0019 07/30/19 0417  BP: 137/87     Pulse: 74     Resp:      Temp:  98.1 F (36.7 C) 97.8 F (36.6 C) 98.1 F (36.7 C)  TempSrc:  Oral Oral Oral  SpO2:      Weight:      Height:        Intake/Output Summary (Last 24 hours) at 07/30/2019 0747 Last data filed at 07/29/2019 2200 Gross per 24 hour  Intake 860 ml  Output 1800 ml  Net -940 ml   Filed Weights   07/27/19 0338 07/28/19 0417 07/29/19 0311  Weight: 74.1 kg 74.1 kg 72.9 kg    Physical Exam   General: Well developed, well nourished, NAD Skin: Warm, dry, intact  Head: Normocephalic, atraumatic, sclera non-icteric, no xanthomas, clear, moist mucus membranes. Neck: Negative for carotid bruits. No JVD Lungs:Clear to ausculation bilaterally. No wheezes, rales, or rhonchi. Breathing is unlabored. Cardiovascular: RRR with S1 S2. No  murmurs, rubs, gallops, or LV heave appreciated. Abdomen: Soft, non-tender, non-distended with normoactive bowel sounds. No hepatomegaly, No rebound/guarding. No obvious abdominal masses. MSK: Strength and tone appear normal for age. 5/5 in all extremities Extremities: No edema. No clubbing or cyanosis. DP/PT pulses 2+ bilaterally Neuro: Alert and oriented. No focal deficits. No facial asymmetry. MAE spontaneously. Psych: Responds to questions appropriately with normal affect.    Labs    Chemistry Recent Labs  Lab 07/24/19 0336 07/27/19 1105 07/28/19 0617 07/29/19 0409 07/30/19 0413  NA 142 134* 137 142 141  K 3.5 3.8 3.8 4.0 4.3  CL 108 106 107 107 106  CO2 23 18* 18* 23 23  GLUCOSE 166* 213* 104* 110* 111*  BUN 15 43* 53* 57* 57*  CREATININE 0.74 3.27* 4.12* 4.36* 4.36*  CALCIUM 9.0 8.1* 7.9* 7.9* 8.3*  PROT 6.1*  --   --   --   --   ALBUMIN 3.5 3.0*  --  2.6* 3.0*  AST 26  --   --   --   --   ALT 24  --   --   --   --   ALKPHOS 66  --   --   --   --   BILITOT 1.5*  --   --   --   --  GFRNONAA >60 17* 13* 12* 12*  GFRAA >60 20* 15* 14* 14*  ANIONGAP 11 10 12 12 12      Hematology Recent Labs  Lab 07/24/19 0336 07/27/19 0444 07/28/19 0617  WBC 13.6* 10.4 6.8  RBC 4.19* 3.15* 2.82*  HGB 14.2 10.9* 9.7*  HCT 39.4 30.8* 27.4*  MCV 94.0 97.8 97.2  MCH 33.9 34.6* 34.4*  MCHC 36.0 35.4 35.4  RDW 13.8 14.3 14.1  PLT 329 190 142*    Cardiac EnzymesNo results for input(s): TROPONINI in the last 168 hours. No results for input(s): TROPIPOC in the last 168 hours.   BNPNo results for input(s): BNP, PROBNP in the last 168 hours.   DDimer No results for input(s): DDIMER in the last 168 hours.   Radiology    No results found.  Telemetry    NSR 07/30/2019  - Personally Reviewed  ECG    SR lateral T wave inversions  - Personally Reviewed  Cardiac Studies    Echo: EF 30-35% 07/14/19  1. Technically difficult study. Left ventricular ejection fraction, by visual  estimation, is 30 to 35%. The left ventricle has grossly severely decreased function. Images are not sufficient to assess for regional wall motion abnormalities. There is  mildly increased left ventricular hypertrophy. 2. Left ventricular diastolic parameters are consistent with Grade I diastolic dysfunction (impaired relaxation). 3. Global right ventricle has normal systolic function.The right ventricular size is normal. 4. The mitral valve is normal in structure. No evidence of mitral valve regurgitation. 5. The tricuspid valve is not well visualized. Tricuspid valve regurgitation is not demonstrated. 6. The aortic valve was not well visualized. Aortic valve regurgitation is not visualized. 7. The pulmonic valve was not well visualized. Pulmonic valve regurgitation is not visualized.   Patient Profile     78 y.o. male with PMH including  thoracic aortic aneurysm, CAD, chronic back pain, chronic opioid use presented to [facility] ED 12/1 with altered mental status and abd pain. SEMI by enzymes , EF now 30-35%  Assessment & Plan    1. SEMI: - no angina no acute ECG changes currently not a candidate for cath with MS Changes and acute renal failure will risk stratify as outpatient will arrange f/u with Dr Gwenlyn Found   2. DCM: aldactone/ACE held continue ASA/statin and beta blocker   3.ARF:  Had urinary retention  but Cr not improved after foley  Cr up to 4.36  nephrotoxic drugs held has prostate issues on flomax and proscar Not a cath candidate   4. MS:  Not clear what his baseline is not wanting to lay in bed this am nurse has set alarms   Cardiology will sign off   Jenkins Rouge

## 2019-07-30 NOTE — Progress Notes (Signed)
[facility] NEPHROLOGY PROGRESS NOTE  Assessment/ Plan: Pt is a 78 y.o. yo male   with history of CAD, chronic back pain, thoracic aortic aneurysm who was initially admitted on 12/1 for AMS and abdominal pain due to cholecystitis, seen as a consultation for AKI.  #Acute kidney injury, nonoliguric likely obstructive uropathy and may have some hemodynamic changes. -UA likely traumatic.  Kidney US consistent with hydronephrosis. -Creatinine level is stable.  No uremia.  Monitor BMP, hold for renal recovery.  No need for dialysis.  #Acute urinary retention/hydronephrosis: On Flomax, finasteride and has Foley catheter.  Seen by urologist.  #Metabolic acidosis: Continue sodium bicarbonate p.o.  #Hypertension: Blood pressure acceptable.  On carvedilol.  #Acute metabolic encephalopathy: Monitor mental status.  Per primary team.  #Acute respiratory failure with hypoxia: He required mechanical ventilation, now on room air.  #NSTEMI: Seen by cardiology.  Planned for outpatient follow-up for possible cardiac cath.  Subjective: Seen and examined at bedside.  Urine output of 1800 cc.  No new event.  Sitting on chair comfortable.  Objective Vital signs in last 24 hours: Vitals:   07/29/19 1943 07/30/19 0019 07/30/19 0417 07/30/19 0944  BP:    126/81  Pulse:    77  Resp:      Temp: 98.1 F (36.7 C) 97.8 F (36.6 C) 98.1 F (36.7 C)   TempSrc: Oral Oral Oral   SpO2:      Weight:      Height:       Weight change:   Intake/Output Summary (Last 24 hours) at 07/30/2019 1320 Last data filed at 07/29/2019 2200 Gross per 24 hour  Intake 250 ml  Output 1800 ml  Net -1550 ml       Labs: Basic Metabolic Panel: Recent Labs  Lab 07/27/19 1105 07/28/19 0617 07/29/19 0409 07/30/19 0413  NA 134* 137 142 141  K 3.8 3.8 4.0 4.3  CL 106 107 107 106  CO2 18* 18* 23 23  GLUCOSE 213* 104* 110* 111*  BUN 43* 53* 57* 57*  CREATININE 3.27* 4.12* 4.36* 4.36*  CALCIUM  8.1* 7.9* 7.9* 8.3*  PHOS 5.1*  --  5.6* 5.1*   Liver Function Tests: Recent Labs  Lab 07/24/19 0336 07/27/19 1105 07/29/19 0409 07/30/19 0413  AST 26  --   --   --   ALT 24  --   --   --   ALKPHOS 66  --   --   --   BILITOT 1.5*  --   --   --   PROT 6.1*  --   --   --   ALBUMIN 3.5 3.0* 2.6* 3.0*   No results for input(s): LIPASE, AMYLASE in the last 168 hours. No results for input(s): AMMONIA in the last 168 hours. CBC: Recent Labs  Lab 07/24/19 0336 07/27/19 0444 07/28/19 0617  WBC 13.6* 10.4 6.8  HGB 14.2 10.9* 9.7*  HCT 39.4 30.8* 27.4*  MCV 94.0 97.8 97.2  PLT 329 190 142*   Cardiac Enzymes: No results for input(s): CKTOTAL, CKMB, CKMBINDEX, TROPONINI in the last 168 hours. CBG: Recent Labs  Lab 07/29/19 1656 07/29/19 1948 07/30/19 0015 07/30/19 0416 07/30/19 0808  GLUCAP 138* 103* 129* 100* 198*    Iron Studies: No results for input(s): IRON, TIBC, TRANSFERRIN, FERRITIN in the last 72 hours. Studies/Results: No results found.  Medications: Infusions:   Scheduled Medications: . aspirin  81 mg Oral Daily  . atorvastatin  40 mg Oral q1800  . bisacodyl  5 mg Oral BID  . carvedilol  12.5 mg Oral BID WC  . Chlorhexidine Gluconate Cloth  6 each Topical Daily  . cloNIDine  0.1 mg Transdermal Weekly  . enoxaparin (LOVENOX) injection  30 mg Subcutaneous Q24H  . finasteride  5 mg Oral Daily  . lidocaine  1 application Urethral Once  . mouth rinse  15 mL Mouth Rinse BID  . pantoprazole  40 mg Oral QHS  . polyethylene glycol  17 g Oral Daily  . sodium bicarbonate  1,300 mg Oral BID  . tamsulosin  0.4 mg Oral Daily    have reviewed scheduled and prn medications.  Physical Exam: General:NAD, comfortable Heart:RRR, s1s2 nl Lungs: Clear b/l, no crackle Abdomen:soft, Non-tender, non-distended Extremities:No LE edema   Lilinoe Acklin Tanna Furry 07/30/2019,1:20 PM  LOS: 17 days  Pager: [phone]

## 2019-07-30 NOTE — Progress Notes (Signed)
Physical Therapy Treatment Patient Details Name: Riley Eaton MRN: [id number] DOB: 1940-10-09 Today's Date: 07/30/2019    History of Present Illness Riley Eaton is a 78 y.o. male who presented to ED 12/1 with abd pain and AMS.  Had been seen 1 day earlier and CT abd / pelv showed cholelithiasis without cholecystitis. Intubated 12/4-12/7. PHMx: Thoracic aortic aneurysm; Chronic low back pain; Choledocholithiasis; Chronic pain; Acute metabolic encephalopathy; ACS. 12/3 CT head: Significant motion limitations. No evidence of acute abnormality    PT Comments    Patient received sitting at EOB, restless and impulsive today but very motivated to work with therapy. Required constant cuing to prevent him getting up before therapist was ready with equipment/room. Able to complete functional transfers with RW/MinA and progressed gait training to approximately 71f with MinA/RW and cues for safety. He asked lots of appropriate questions about how much longer he would be in the hospital, how long he might be in rehab, and if his nurse could give him anything to help with sleep as he does not find the bed comfortable. He was left sitting at EOB with all needs met, bed alarm active this morning.     Follow Up Recommendations  CIR;Supervision/Assistance - 24 hour     Equipment Recommendations  Other (comment)(defer)    Recommendations for Other Services       Precautions / Restrictions Precautions Precautions: Fall Precaution Comments: HOH Restrictions Weight Bearing Restrictions: No    Mobility  Bed Mobility               General bed mobility comments: seated edge of bed upon arrival  Transfers Overall transfer level: Needs assistance Equipment used: Rolling walker (2 wheeled) Transfers: Sit to/from Stand Sit to Stand: Min assist         General transfer comment: MinA to boost to full standing position and gain balance  Ambulation/Gait Ambulation/Gait assistance:  Min assist Gait Distance (Feet): 70 Feet Assistive device: Rolling walker (2 wheeled) Gait Pattern/deviations: Step-through pattern;Decreased stride length;Trunk flexed;Drifts right/left Gait velocity: decreased   General Gait Details: MinA for balance and for safe management of RW, impulsively reaches out for hand sanitizer then leans on RW with forearms while walking, cues for safety and sequencing   Stairs             Wheelchair Mobility    Modified Rankin (Stroke Patients Only)       Balance Overall balance assessment: Needs assistance Sitting-balance support: No upper extremity supported;Feet supported Sitting balance-Leahy Scale: Normal Sitting balance - Comments: able to reach down to adjust socks while seated at EOB   Standing balance support: Bilateral upper extremity supported;During functional activity Standing balance-Leahy Scale: Poor Standing balance comment: reliant on external support, impulsively reaches out of BOS                            Cognition Arousal/Alertness: Awake/alert Behavior During Therapy: Impulsive Overall Cognitive Status: Impaired/Different from baseline Area of Impairment: Attention;Memory;Following commands;Safety/judgement                 Orientation Level: Disoriented to;Situation;Time Current Attention Level: Sustained Memory: Decreased short-term memory Following Commands: Follows one step commands with increased time;Follows one step commands consistently;Follows multi-step commands inconsistently Safety/Judgement: Decreased awareness of safety;Decreased awareness of deficits Awareness: Emergent Problem Solving: Requires verbal cues;Requires tactile cues General Comments: very easily distracted and requires constant cues to prevent him from getting up before therapist was ready,  poor safety awareness      Exercises      General Comments General comments (skin integrity, edema, etc.): VSS       Pertinent Vitals/Pain Pain Assessment: No/denies pain Pain Score: 0-No pain Faces Pain Scale: No hurt Pain Intervention(s): Limited activity within patient's tolerance;Monitored during session    Home Living                      Prior Function            PT Goals (current goals can now be found in the care plan section) Acute Rehab PT Goals Patient Stated Goal: "to get moving" PT Goal Formulation: With patient Time For Goal Achievement: 08/03/19 Potential to Achieve Goals: Good Progress towards PT goals: Progressing toward goals    Frequency    Min 3X/week      PT Plan Current plan remains appropriate    Co-evaluation              AM-PAC PT "6 Clicks" Mobility   Outcome Measure  Help needed turning from your back to your side while in a flat bed without using bedrails?: A Little Help needed moving from lying on your back to sitting on the side of a flat bed without using bedrails?: A Little Help needed moving to and from a bed to a chair (including a wheelchair)?: A Little Help needed standing up from a chair using your arms (e.g., wheelchair or bedside chair)?: A Little Help needed to walk in hospital room?: A Little Help needed climbing 3-5 steps with a railing? : A Lot 6 Click Score: 17    End of Session Equipment Utilized During Treatment: Gait belt Activity Tolerance: Patient tolerated treatment well Patient left: in bed;with call bell/phone within reach;with bed alarm set(sitting at EOB- refused up to chair)   PT Visit Diagnosis: Other abnormalities of gait and mobility (R26.89);Muscle weakness (generalized) (M62.81);Difficulty in walking, not elsewhere classified (R26.2)     Time: 3532-9924 PT Time Calculation (min) (ACUTE ONLY): 24 min  Charges:  $Gait Training: 8-22 mins $Therapeutic Activity: 8-22 mins                     Windell Norfolk, DPT, PN1   Supplemental Physical Therapist [place]    Pager [phone] Acute Rehab  Office [phone]

## 2019-07-30 NOTE — Progress Notes (Signed)
PROGRESS NOTE  Riley Eaton [id number] DOB: 1940/10/16 DOA: 07/13/2019 PCP: Aletha Halim., PA-C  Brief History   Riley R Lawsonis a 78 y.o.malewith PMH including but not limited to thoracic aortic aneurysm, CAD, chronic back pain, chronic opioid use presented to [facility] ED 12/1 with altered mental status and abd pain. He had been seen in [facility] ED 1 day prior for abd pain. Patient had CT of the abd at the time that demonstrated cholelithiasis without cholecystitis. Surgery was consulted and recommended outpatient follow up. He was subsequently discharged home.  On 12/1 patient had worsening of pain along with AMS; therefore, came to [facility] ED. He was quite agitated per reports and received 1mg  dilaudid, 5mg  haldol, 1mg  ativan x 3 and had minimal response. PCCM was asked to see in consultation for consideration of precedex. Per chart review, he has no underlying hx of EtOH abuse or substance abuse. He does have hx of chronic low back pain and chronic opioid use. On review of outpatient med list, he had 5-325 norco prescribed for q6hrs PRN, 40mg  oxycontin TID, lyrica 200mg  TID, duloxetine 60mg  BID.  Per chart review, he had admission 09/24/18 through 10/06/18 for acute ascending cholangitis s/p ERCP and sphincterotomy with stenting of pancreatic duct (Dr. Therisa Doyne). During that admission, he did have severe delirium felt to be due to narcotic withdrawal. He did spend a few days in ICU and was briefly treated with precedex and fentanyl. He was discharged and supposed to be evaluated by surgery as an outpatient for cholecystectomy; however, this was postponed due to the [place] pandemic. Per discharge summary, his medication regimen was adjusted prior to d/c and pt had been tolerating it well (oxycodone weaned from 80mg  BID to BID, lyrica reduced from 200mg  TID to 100mg  TID, cymbalta from 60mg  BID to 60mg  daily).  Patient found to have acute metabolic encephalopathy, unknown cause.  Is also  noted to have acute respiratory failure and required intubation for airway protection.  Currently he is on room air.  There were complaints of abdominal pain however HIDA scan was unremarkable.  Patient also noted to have NSTEMI with elevated troponins over 2000.  Cardiology consulted, pending heart catheterization possibly later this week vs as an outpatient given his current renal failure and mental status. Nephrology has been consulted.  Plan is for the patient to go toSNF once a bed is available.  Consultants  . Cardiology . Nephrology . Gastroenterology . General Surgery . Interventional Radiation . PCCM  Procedures  . MRCP . Mechanical ventilation  Antibiotics   Anti-infectives (From admission, onward)   Start     Dose/Rate Route Frequency Ordered Stop   07/21/19 0100  vancomycin (VANCOCIN) 1,500 mg in sodium chloride 0.9 % 500 mL IVPB     1,500 mg 250 mL/hr over 120 Minutes Intravenous Every 12 hours 07/20/19 1608 07/26/19 1437   07/20/19 1018  cefTRIAXone (ROCEPHIN) 2 g in sodium chloride 0.9 % 100 mL IVPB  Status:  Discontinued     2 g 200 mL/hr over 30 Minutes Intravenous Every 24 hours 07/19/19 1115 07/20/19 0800   07/20/19 1018  cefTRIAXone (ROCEPHIN) 2 g in sodium chloride 0.9 % 100 mL IVPB  Status:  Discontinued     2 g 200 mL/hr over 30 Minutes Intravenous Every 24 hours 07/20/19 0800 07/20/19 0921   07/17/19 0100  vancomycin (VANCOCIN) IVPB 750 mg/150 ml premix  Status:  Discontinued     750 mg 150 mL/hr over 60 Minutes Intravenous Every 12  hours 07/16/19 1240 07/20/19 1608   07/16/19 1400  acyclovir (ZOVIRAX) 700 mg in dextrose 5 % 100 mL IVPB  Status:  Discontinued     10 mg/kg  69.9 kg 114 mL/hr over 60 Minutes Intravenous Every 8 hours 07/16/19 1240 07/19/19 1115   07/16/19 1245  cefTRIAXone (ROCEPHIN) 2 g in sodium chloride 0.9 % 100 mL IVPB  Status:  Discontinued     2 g 200 mL/hr over 30 Minutes Intravenous Every 12 hours 07/16/19 1240 07/19/19 1115    07/16/19 1245  vancomycin (VANCOCIN) 1,500 mg in sodium chloride 0.9 % 500 mL IVPB     1,500 mg 250 mL/hr over 120 Minutes Intravenous  Once 07/16/19 1240 07/16/19 1609   07/16/19 1245  ampicillin (OMNIPEN) 2 g in sodium chloride 0.9 % 100 mL IVPB  Status:  Discontinued     2 g 300 mL/hr over 20 Minutes Intravenous Every 4 hours 07/16/19 1240 07/19/19 1115   07/13/19 1900  cefTRIAXone (ROCEPHIN) 1 g in sodium chloride 0.9 % 100 mL IVPB  Status:  Discontinued     1 g 200 mL/hr over 30 Minutes Intravenous Every 24 hours 07/13/19 1825 07/16/19 1240   07/13/19 1900  metroNIDAZOLE (FLAGYL) IVPB 500 mg  Status:  Discontinued     500 mg 100 mL/hr over 60 Minutes Intravenous Every 8 hours 07/13/19 1825 07/20/19 0921     Subjective  The patient is sitting up in a chair at bedside. No new complaints.  Objective   Vitals:  Vitals:   07/30/19 1546 07/30/19 1716  BP: 128/60 135/89  Pulse: 75 80  Resp: 15   Temp: 97.6 F (36.4 C)   SpO2: 93%    Exam:  Constitutional:  . The patient is awake, alert, and oriented x 3. He is in no acute distress. Respiratory:  . No increased work of breathing. . No wheezes, rales, or rhonchi . No tactile fremitus Cardiovascular:  . Regular rate and rhythm . No murmurs, ectopy, or gallups. . No lateral PMI. No thrills. Abdomen:  . Abdomen is soft, non-tender, non-distended . No hernias, masses, or organomegaly . Normoactive bowel sounds.  Musculoskeletal:  . No cyanosis, clubbing, or edema Skin:  . No rashes, lesions, ulcers . palpation of skin: no induration or nodules Neurologic:  . CN 2-12 intact . Sensation all 4 extremities intact . Moving all extremities Psychiatric:  . Mental status: Pt is agitated. . judgment and insight not intact  I have personally reviewed the following:   Today's Data  . Vitals . BMP . CBC  Micro Data  . MRSA by PCR positive 07/19/2019.  Scheduled Meds: . aspirin  81 mg Oral Daily  . atorvastatin  40 mg  Oral q1800  . bisacodyl  5 mg Oral BID  . carvedilol  12.5 mg Oral BID WC  . Chlorhexidine Gluconate Cloth  6 each Topical Daily  . cloNIDine  0.1 mg Transdermal Weekly  . enoxaparin (LOVENOX) injection  30 mg Subcutaneous Q24H  . finasteride  5 mg Oral Daily  . lidocaine  1 application Urethral Once  . mouth rinse  15 mL Mouth Rinse BID  . pantoprazole  40 mg Oral QHS  . polyethylene glycol  17 g Oral Daily  . sodium bicarbonate  1,300 mg Oral BID  . tamsulosin  0.4 mg Oral Daily   Continuous Infusions:   Active Problems:   Chronic low back pain   Choledocholithiasis   Acute metabolic encephalopathy   Mental status  alteration   ACS (acute coronary syndrome) (HCC)   Endotracheal tube present   Alteration in nutrition   Encephalopathy   Abdominal pain   Non-ST elevation (NSTEMI) myocardial infarction (HCC)   Hypokalemia   DCM (dilated cardiomyopathy) ([place])   LOS: 17 days   A & P  Acute metabolic encephalopathy: Much improved from previous. Not sure what baseline mental status is for this gentleman. He almost seems a little manic now. Previous work up of his mental status included CT head/MRI, lactate, procalcitonin, TSH ammonium levels. Blood cultures have had no growth. He is on chronic oxycodone for chronic pain. He continues to require a Actuary.   Acute kidney injury and urinary retention: Creatinine 4.12 today. His baseline appears to be 1.7. Nephrology consulted as creatinine continues to rise.Losartan and spironolactone have been held. Renal ultrasound demonstrated mild to moderate hydronephrosis on the left. ; demonstrated. Urology has been consulted as well following a failed attempt to place a foley catheter following bladder scan with 999 of urine. 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 Coude catheter finally placed by catheter team with 1.8 L of urine out. Cardiology had considered a possible LHC later this week, at this point they feel that he is a poor  candidate for LHC. Possibly for outpatient after discharge. Nephrology has signed off.  Acute respiratory failure: Patient was intubated for airway protection. He also had aspiration pneumonia. Sputum culture was positive for MRSA. He had 10 days of IV vancomycin.  The patient is currently saturating at 98% on room air.   Abdominal pain: No current complaints of abdominal pain. The patient is s/p ERCP with sphincterotomy and pancreatic duct stenting in 09/25/2018. CT abdomen performed on this admission demonstrated cholelithiasis with no active disease. HIDA scan suggested chronic cholecystitis, no evidence for acute cholecystitis, and a patent cystic duct. LFT's are now within normal limits.   Prolonged QT: Avoid offending agents. Monitor potassium and magnesium.  Elevated troponin/NSTEMI: Troponin peaked at 2110 on 07/15/2019. EKG on 07/16/2019 showed deep T wave inversions in the inferior and anterior lateral leads. Echocardiogram 07/14/2019 shows an EF of 30 to 35%, mildly increased LV hypertrophy.  Grade 1 diastolic dysfunction. Cardiology consulted and appreciated. Patient noted to have coronary calcification on CT scan back in February 2020, but he has no current complaints of chest pain. Cardiology has signed off.  Dilated cardiomyopathy/acute systolic heart failure: Echocardiogram demonstrated EF of 30-35% with Grade 1 diastolic dysfunction and mildly increased LV hypertrophy. He is continued on coreg and telemetry monitoring. Cardiology has signed off.  Essential hypertension: Blood pressures remain labile on Coreg. Well controlled this morning. High this afternoon. Losartan, spironolactone held. Will increase dose of coreg.  Hypokalemia: Resolved. Monitor.  Dysphagia/poor oral intake: Pt is obsessed with liberating his diet. SLP has advanced him to a mechanical soft diet.   Deconditioning/debility: Plan is for the patient to go to CIR when work up is completed and bed is  available.  I have seen and examined this patient myself. I have spent 30 minutes in his evaluation and care.  DVT Prophylaxis: Lovenox Code Status: Full Family Communication: None at bedside Disposition Plan: Admitted.  Patient now with urinary retention and acute kidney injury.  Disposition CIR when medically clear and bed is available.   Grizelda Piscopo, DO [facility] Direct contact: see [email or web address]  7PM-7AM contact night coverage as above 07/30/2019, 6:21 PM  LOS: 15 days

## 2019-07-30 NOTE — Plan of Care (Signed)
  Problem: Education: Goal: Knowledge of General Education information will improve Description: Including pain rating scale, medication(s)/side effects and non-pharmacologic comfort measures Outcome: Progressing   Problem: Clinical Measurements: Goal: Will remain free from infection Outcome: Progressing Goal: Diagnostic test results will improve Outcome: Progressing   Problem: Activity: Goal: Risk for activity intolerance will decrease Outcome: Progressing   Problem: Coping: Goal: Level of anxiety will decrease Outcome: Progressing   Problem: Pain Managment: Goal: General experience of comfort will improve Outcome: Progressing   Problem: Safety: Goal: Ability to remain free from injury will improve Outcome: Progressing   Problem: Skin Integrity: Goal: Risk for impaired skin integrity will decrease Outcome: Progressing   Problem: Role Relationship: Goal: Ability to interact with others will improve Outcome: Progressing   Problem: Safety: Goal: Ability to remain free from injury will improve Outcome: Progressing   Elesa Hacker, RN

## 2019-07-31 LAB — BASIC METABOLIC PANEL
Anion gap: 9 (ref 5–15)
BUN: 55 mg/dL — ABNORMAL HIGH (ref 8–23)
CO2: 25 mmol/L (ref 22–32)
Calcium: 8.2 mg/dL — ABNORMAL LOW (ref 8.9–10.3)
Chloride: 106 mmol/L (ref 98–111)
Creatinine, Ser: 4.3 mg/dL — ABNORMAL HIGH (ref 0.61–1.24)
GFR calc Af Amer: 14 mL/min — ABNORMAL LOW (ref >60)
GFR calc non Af Amer: 12 mL/min — ABNORMAL LOW (ref >60)
Glucose, Bld: 113 mg/dL — ABNORMAL HIGH (ref 70–99)
Potassium: 4.5 mmol/L (ref 3.5–5.1)
Sodium: 140 mmol/L (ref 135–145)

## 2019-07-31 LAB — GLUCOSE, CAPILLARY
Glucose-Capillary: 105 mg/dL — ABNORMAL HIGH (ref 70–99)
Glucose-Capillary: 87 mg/dL (ref 70–99)
Glucose-Capillary: 94 mg/dL (ref 70–99)
Glucose-Capillary: 105 mg/dL — ABNORMAL HIGH (ref 70–99)
Glucose-Capillary: 139 mg/dL — ABNORMAL HIGH (ref 70–99)

## 2019-07-31 MED ORDER — OLANZAPINE 2.5 MG PO TABS
2.5000 mg | ORAL_TABLET | Freq: Every day | ORAL | Status: DC
  Administered 2019-07-31 – 2019-08-01 (×2): 2.5 mg via ORAL
  Filled 2019-07-31 (×2): qty 1

## 2019-07-31 MED ORDER — SODIUM CHLORIDE 0.9 % IV SOLN: INTRAVENOUS | Status: DC

## 2019-07-31 NOTE — Progress Notes (Signed)
Inpatient Rehabilitation Admissions Coordinator  I signed off on this pt 12/14 and recommended SNF. I have alerted MD and SW, Lattie Haw that SNF recommended since 12/14.  Danne Baxter, RN, MSN Rehab Admissions Coordinator [phone] 07/31/2019 9:33 AM

## 2019-07-31 NOTE — Progress Notes (Signed)
[facility] PROGRESS NOTE  Assessment/ Plan: Pt is a 78 y.o. yo male   with history of CAD, chronic back pain, thoracic aortic aneurysm who was initially admitted on 12/1 for AMS and abdominal pain due to cholecystitis, seen as a consultation for AKI.  #Acute kidney injury, nonoliguric likely obstructive uropathy and may have some hemodynamic changes. -UA likely traumatic.  Kidney US consistent with hydronephrosis. -He is nonoliguric, creatinine is a stable for last 3 days.  We will hydrate gently today.  Monitor BMP.  #Acute urinary retention/hydronephrosis: On Flomax, finasteride and has Foley catheter.  Seen by urologist.  #Metabolic acidosis: Improved, discontinue sodium bicarbonate p.o.  #Hypertension: Blood pressure acceptable.  On carvedilol.  #Acute metabolic encephalopathy: Monitor mental status.  Per primary team.  #Acute respiratory failure with hypoxia: He required mechanical ventilation, now on room air.  #NSTEMI: Seen by cardiology.  Planned for outpatient follow-up for possible cardiac cath.  Subjective: Seen and examined at bedside.  Urine output 1475 cc.  No new event.  No nausea vomiting chest pain shortness of breath. Objective Vital signs in last 24 hours: Vitals:   07/31/19 0447 07/31/19 0700 07/31/19 0722 07/31/19 0850  BP: (!) 153/87  (!) 154/91 (!) 141/84  Pulse: 74  71 69  Resp: 15  19   Temp: 97.8 F (36.6 C)  98 F (36.7 C)   TempSrc: Oral  Oral   SpO2:      Weight:  73.7 kg    Height:       Weight change:   Intake/Output Summary (Last 24 hours) at 07/31/2019 1142 Last data filed at 07/31/2019 0946 Gross per 24 hour  Intake 240 ml  Output 2025 ml  Net -1785 ml       Labs: Basic Metabolic Panel: Recent Labs  Lab 07/27/19 1105 07/29/19 0409 07/30/19 0413 07/31/19 0451  NA 134* 142 141 140  K 3.8 4.0 4.3 4.5  CL 106 107 106 106  CO2 18* 23 23 25   GLUCOSE 213* 110* 111* 113*  BUN 43* 57* 57* 55*   CREATININE 3.27* 4.36* 4.36* 4.30*  CALCIUM 8.1* 7.9* 8.3* 8.2*  PHOS 5.1* 5.6* 5.1*  --    Liver Function Tests: Recent Labs  Lab 07/27/19 1105 07/29/19 0409 07/30/19 0413  ALBUMIN 3.0* 2.6* 3.0*   No results for input(s): LIPASE, AMYLASE in the last 168 hours. No results for input(s): AMMONIA in the last 168 hours. CBC: Recent Labs  Lab 07/27/19 0444 07/28/19 0617  WBC 10.4 6.8  HGB 10.9* 9.7*  HCT 30.8* 27.4*  MCV 97.8 97.2  PLT 190 142*   Cardiac Enzymes: No results for input(s): CKTOTAL, CKMB, CKMBINDEX, TROPONINI in the last 168 hours. CBG: Recent Labs  Lab 07/30/19 2102 07/30/19 2342 07/31/19 0443 07/31/19 0719 07/31/19 1101  GLUCAP 115* 95 105* 87 94    Iron Studies: No results for input(s): IRON, TIBC, TRANSFERRIN, FERRITIN in the last 72 hours. Studies/Results: No results found.  Medications: Infusions:   Scheduled Medications: . aspirin  81 mg Oral Daily  . atorvastatin  40 mg Oral q1800  . bisacodyl  5 mg Oral BID  . carvedilol  12.5 mg Oral BID WC  . Chlorhexidine Gluconate Cloth  6 each Topical Daily  . cloNIDine  0.1 mg Transdermal Weekly  . enoxaparin (LOVENOX) injection  30 mg Subcutaneous Q24H  . finasteride  5 mg Oral Daily  . lidocaine  1 application Urethral Once  . mouth rinse  15 mL  Mouth Rinse BID  . pantoprazole  40 mg Oral QHS  . polyethylene glycol  17 g Oral Daily  . sodium bicarbonate  1,300 mg Oral BID  . tamsulosin  0.4 mg Oral Daily    have reviewed scheduled and prn medications.  Physical Exam: General:NAD, comfortable, pleasant but confused Heart:RRR, s1s2 nl Lungs: Clear b/l, no crackle Abdomen:soft, Non-tender, non-distended Extremities:No LE edema   Carmeron Heady Tanna Furry 07/31/2019,11:42 AM  LOS: 18 days  Pager: ID:[phone]

## 2019-07-31 NOTE — Plan of Care (Signed)
  Problem: Clinical Measurements: Goal: Will remain free from infection Outcome: Progressing   Problem: Activity: Goal: Risk for activity intolerance will decrease Outcome: Progressing   Problem: Nutrition: Goal: Adequate nutrition will be maintained Outcome: Progressing   Problem: Coping: Goal: Level of anxiety will decrease Outcome: Progressing   Problem: Pain Managment: Goal: General experience of comfort will improve Outcome: Progressing   Problem: Safety: Goal: Ability to remain free from injury will improve Outcome: Progressing   Problem: Skin Integrity: Goal: Risk for impaired skin integrity will decrease Outcome: Progressing   Problem: Coping: Goal: Coping ability will improve Outcome: Progressing Goal: Will verbalize feelings Outcome: Progressing   Elesa Hacker, RN

## 2019-07-31 NOTE — TOC Progression Note (Signed)
Transition of Care [facility]) - Progression Note    Patient Details  Name: Riley Eaton MRN: [id number] Date of Birth: 06-Oct-1940  Transition of Care [facility]) CM/SW [place], LCSW Phone Number:[phone] 07/31/2019, 12:24 PM  Clinical Narrative:   CSW followed up with facility and they have received authorization. CSW inquired about sitter due to facility asking if patient was still receiving sitter. CSW followed up with MD and was notified that patient still has sitter. CSW was informed by facility that patent had to be sitter free for at least 24 hours.MD and RN was alerted of update about sitter.  Facility stated that they would check to see if patient could come tomorrow.  CSW will continue to follow for discharge planning needs.    Expected Discharge Plan: [place] Barriers to Discharge: Continued Medical Work up, Ship broker  Expected Discharge Plan and Services Expected Discharge Plan: [place] In-house Referral: NA Discharge Planning Services: NA Post Acute Care Choice: [place] arrangements for the past 2 months: Single Family Home                                       Social Determinants of Health (SDOH) Interventions    Readmission Risk Interventions No flowsheet data found.

## 2019-07-31 NOTE — Progress Notes (Signed)
PROGRESS NOTE  Riley Eaton [id number] DOB: [birth year] DOA: 07/13/2019 PCP: Aletha Halim., PA-C  Brief History   Riley R Lawsonis a 78 y.o.malewith PMH including but not limited to thoracic aortic aneurysm, CAD, chronic back pain, chronic opioid use presented to [facility] ED 12/1 with altered mental status and abd pain. He had been seen in [facility] ED 1 day prior for abd pain. Patient had CT of the abd at the time that demonstrated cholelithiasis without cholecystitis. Surgery was consulted and recommended outpatient follow up. He was subsequently discharged home.  On 12/1 patient had worsening of pain along with AMS; therefore, came to [facility] ED. He was quite agitated per reports and received 1mg  dilaudid, 5mg  haldol, 1mg  ativan x 3 and had minimal response. PCCM was asked to see in consultation for consideration of precedex. Per chart review, he has no underlying hx of EtOH abuse or substance abuse. He does have hx of chronic low back pain and chronic opioid use. On review of outpatient med list, he had 5-325 norco prescribed for q6hrs PRN, 40mg  oxycontin TID, lyrica 200mg  TID, duloxetine 60mg  BID.  Per chart review, he had admission 09/24/18 through 10/06/18 for acute ascending cholangitis s/p ERCP and sphincterotomy with stenting of pancreatic duct (Dr. Therisa Doyne). During that admission, he did have severe delirium felt to be due to narcotic withdrawal. He did spend a few days in ICU and was briefly treated with precedex and fentanyl. He was discharged and supposed to be evaluated by surgery as an outpatient for cholecystectomy; however, this was postponed due to the [place] pandemic. Per discharge summary, his medication regimen was adjusted prior to d/c and pt had been tolerating it well (oxycodone weaned from 80mg  BID to BID, lyrica reduced from 200mg  TID to 100mg  TID, cymbalta from 60mg  BID to 60mg  daily).  Patient found to have acute metabolic encephalopathy, unknown cause.  Is also  noted to have acute respiratory failure and required intubation for airway protection.  Currently he is on room air.  There were complaints of abdominal pain however HIDA scan was unremarkable.  Patient also noted to have NSTEMI with elevated troponins over 2000.  Cardiology consulted, pending heart catheterization possibly later this week vs as an outpatient given his current renal failure and mental status. Nephrology has been consulted.  Plan is for the patient to go toSNF once a bed is available. However the patient will need to be without 1 to 1 sitter for 24 hours before he can go.   Consultants  . Cardiology . Nephrology . Gastroenterology . General Surgery . Interventional Radiation . PCCM  Procedures  . MRCP . Mechanical ventilation  Antibiotics   Anti-infectives (From admission, onward)   Start     Dose/Rate Route Frequency Ordered Stop   07/21/19 0100  vancomycin (VANCOCIN) 1,500 mg in sodium chloride 0.9 % 500 mL IVPB     1,500 mg 250 mL/hr over 120 Minutes Intravenous Every 12 hours 07/20/19 1608 07/26/19 1437   07/20/19 1018  cefTRIAXone (ROCEPHIN) 2 g in sodium chloride 0.9 % 100 mL IVPB  Status:  Discontinued     2 g 200 mL/hr over 30 Minutes Intravenous Every 24 hours 07/19/19 1115 07/20/19 0800   07/20/19 1018  cefTRIAXone (ROCEPHIN) 2 g in sodium chloride 0.9 % 100 mL IVPB  Status:  Discontinued     2 g 200 mL/hr over 30 Minutes Intravenous Every 24 hours 07/20/19 0800 07/20/19 0921   07/17/19 0100  vancomycin (VANCOCIN) IVPB 750 mg/150  ml premix  Status:  Discontinued     750 mg 150 mL/hr over 60 Minutes Intravenous Every 12 hours 07/16/19 1240 07/20/19 1608   07/16/19 1400  acyclovir (ZOVIRAX) 700 mg in dextrose 5 % 100 mL IVPB  Status:  Discontinued     10 mg/kg  69.9 kg 114 mL/hr over 60 Minutes Intravenous Every 8 hours 07/16/19 1240 07/19/19 1115   07/16/19 1245  cefTRIAXone (ROCEPHIN) 2 g in sodium chloride 0.9 % 100 mL IVPB  Status:  Discontinued     2  g 200 mL/hr over 30 Minutes Intravenous Every 12 hours 07/16/19 1240 07/19/19 1115   07/16/19 1245  vancomycin (VANCOCIN) 1,500 mg in sodium chloride 0.9 % 500 mL IVPB     1,500 mg 250 mL/hr over 120 Minutes Intravenous  Once 07/16/19 1240 07/16/19 1609   07/16/19 1245  ampicillin (OMNIPEN) 2 g in sodium chloride 0.9 % 100 mL IVPB  Status:  Discontinued     2 g 300 mL/hr over 20 Minutes Intravenous Every 4 hours 07/16/19 1240 07/19/19 1115   07/13/19 1900  cefTRIAXone (ROCEPHIN) 1 g in sodium chloride 0.9 % 100 mL IVPB  Status:  Discontinued     1 g 200 mL/hr over 30 Minutes Intravenous Every 24 hours 07/13/19 1825 07/16/19 1240   07/13/19 1900  metroNIDAZOLE (FLAGYL) IVPB 500 mg  Status:  Discontinued     500 mg 100 mL/hr over 60 Minutes Intravenous Every 8 hours 07/13/19 1825 07/20/19 0921     Subjective  The patient is sitting up in a chair at bedside. No new complaints. He is quite anxious for discharge.  Objective   Vitals:  Vitals:   07/31/19 1152 07/31/19 1625  BP: 110/61 138/74  Pulse: 78 94  Resp: 19 17  Temp: 97.9 F (36.6 C) 98.1 F (36.7 C)  SpO2:     Exam:  Constitutional:  . The patient is awake, alert, and oriented x 3. He is in no acute distress. Respiratory:  . No increased work of breathing. . No wheezes, rales, or rhonchi . No tactile fremitus Cardiovascular:  . Regular rate and rhythm . No murmurs, ectopy, or gallups. . No lateral PMI. No thrills. Abdomen:  . Abdomen is soft, non-tender, non-distended . No hernias, masses, or organomegaly . Normoactive bowel sounds.  Musculoskeletal:  . No cyanosis, clubbing, or edema Skin:  . No rashes, lesions, ulcers . palpation of skin: no induration or nodules Neurologic:  . CN 2-12 intact . Sensation all 4 extremities intact . Moving all extremities Psychiatric:  . Mental status: Pt is agitated. . judgment and insight not intact  I have personally reviewed the following:   Today's Data   . Vitals . BMP . CBC  Micro Data  . MRSA by PCR positive 07/19/2019.  Scheduled Meds: . aspirin  81 mg Oral Daily  . atorvastatin  40 mg Oral q1800  . bisacodyl  5 mg Oral BID  . carvedilol  12.5 mg Oral BID WC  . Chlorhexidine Gluconate Cloth  6 each Topical Daily  . cloNIDine  0.1 mg Transdermal Weekly  . enoxaparin (LOVENOX) injection  30 mg Subcutaneous Q24H  . finasteride  5 mg Oral Daily  . lidocaine  1 application Urethral Once  . mouth rinse  15 mL Mouth Rinse BID  . OLANZapine  2.5 mg Oral Daily  . pantoprazole  40 mg Oral QHS  . polyethylene glycol  17 g Oral Daily  . tamsulosin  0.4 mg  Oral Daily   Continuous Infusions: . sodium chloride 100 mL/hr at 07/31/19 1359    Active Problems:   Chronic low back pain   Choledocholithiasis   Acute metabolic encephalopathy   Mental status alteration   ACS (acute coronary syndrome) (HCC)   Endotracheal tube present   Alteration in nutrition   Encephalopathy   Abdominal pain   Non-ST elevation (NSTEMI) myocardial infarction ([place])   Hypokalemia   DCM (dilated cardiomyopathy) ([place])   LOS: 18 days   A & P  Acute metabolic encephalopathy: Much improved from previous. Not sure what baseline mental status is for this gentleman. He almost seems a little manic now. Previous work up of his mental status included CT head/MRI, lactate, procalcitonin, TSH ammonium levels. Blood cultures have had no growth. He is on chronic oxycodone for chronic pain. He continues to require a Actuary. This complicates his discharge to SNF. I have added low dose zyprexa to calm agitation and allow [place] to discontinue sitter.   Acute kidney injury and urinary retention: Creatinine 4.12 today. His baseline appears to be 1.7. Nephrology consulted as creatinine continues to rise.Losartan and spironolactone have been held. Renal ultrasound demonstrated mild to moderate hydronephrosis on the left. ; demonstrated. Urology has been consulted as well following a  failed attempt to place a foley catheter following bladder scan with 999 of urine. 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 Coude catheter finally placed by catheter team with 1.8 L of urine out. Cardiology had considered a possible LHC later this week, at this point they feel that he is a poor candidate for LHC. Possibly for outpatient after discharge.   Acute respiratory failure: Patient was intubated for airway protection. He also had aspiration pneumonia. Sputum culture was positive for MRSA. He had 10 days of IV vancomycin.  The patient is currently saturating at 98% on room air.   Abdominal pain: No current complaints of abdominal pain. The patient is s/p ERCP with sphincterotomy and pancreatic duct stenting in 09/25/2018. CT abdomen performed on this admission demonstrated cholelithiasis with no active disease. HIDA scan suggested chronic cholecystitis, no evidence for acute cholecystitis, and a patent cystic duct. LFT's are now within normal limits.   Prolonged QT: Avoid offending agents. Monitor potassium and magnesium.  Elevated troponin/NSTEMI: Troponin peaked at 2110 on 07/15/2019. EKG on 07/16/2019 showed deep T wave inversions in the inferior and anterior lateral leads. Echocardiogram 07/14/2019 shows an EF of 30 to 35%, mildly increased LV hypertrophy.  Grade 1 diastolic dysfunction. Cardiology consulted and appreciated. Patient noted to have coronary calcification on CT scan back in February 2020, but he has no current complaints of chest pain. Cardiology has signed off.  Dilated cardiomyopathy/acute systolic heart failure: Echocardiogram demonstrated EF of 30-35% with Grade 1 diastolic dysfunction and mildly increased LV hypertrophy. He is continued on coreg and telemetry monitoring. Cardiology has signed off.  Essential hypertension: Blood pressures remain labile on Coreg. Well controlled this morning. High this afternoon. Losartan, spironolactone held. Will increase dose of  coreg.  Hypokalemia: Resolved. Monitor.  Dysphagia/poor oral intake: Pt is obsessed with liberating his diet. SLP has advanced him to a mechanical soft diet.   Deconditioning/debility: Plan is for the patient to go to CIR when work up is completed and bed is available.  I have seen and examined this patient myself. I have spent 32 minutes in his evaluation and care.  DVT Prophylaxis: Lovenox Code Status: Full Family Communication: None at bedside Disposition Plan: SNF. However  patient will need to be without 1 to 1 sitter for 24 hours before he can be discharged  Ember Henrikson, DO [facility] Direct contact: see [email or web address]  7PM-7AM contact night coverage as above 07/31/2019, 4:32 PM  LOS: 15 days

## 2019-08-01 LAB — RENAL FUNCTION PANEL
Albumin: 2.5 g/dL — ABNORMAL LOW (ref 3.5–5.0)
Anion gap: 10 (ref 5–15)
BUN: 53 mg/dL — ABNORMAL HIGH (ref 8–23)
CO2: 24 mmol/L (ref 22–32)
Calcium: 7.7 mg/dL — ABNORMAL LOW (ref 8.9–10.3)
Chloride: 106 mmol/L (ref 98–111)
Creatinine, Ser: 3.9 mg/dL — ABNORMAL HIGH (ref 0.61–1.24)
GFR calc Af Amer: 16 mL/min — ABNORMAL LOW (ref >60)
GFR calc non Af Amer: 14 mL/min — ABNORMAL LOW (ref >60)
Glucose, Bld: 104 mg/dL — ABNORMAL HIGH (ref 70–99)
Phosphorus: 4.6 mg/dL (ref 2.5–4.6)
Potassium: 4.4 mmol/L (ref 3.5–5.1)
Sodium: 140 mmol/L (ref 135–145)

## 2019-08-01 LAB — GLUCOSE, CAPILLARY
Glucose-Capillary: 84 mg/dL (ref 70–99)
Glucose-Capillary: 87 mg/dL (ref 70–99)
Glucose-Capillary: 126 mg/dL — ABNORMAL HIGH (ref 70–99)

## 2019-08-01 MED ORDER — OLANZAPINE 5 MG PO TABS
5.0000 mg | ORAL_TABLET | Freq: Every day | ORAL | Status: DC
  Administered 2019-08-02: 5 mg via ORAL
  Filled 2019-08-01: qty 1

## 2019-08-01 NOTE — TOC Progression Note (Signed)
Transition of Care [facility]) - Progression Note    Patient Details  Name: DEDRICK AVERA MRN: [id number] Date of Birth: March 11, 1941  Transition of Care [facility]) CM/SW [place], LCSW Phone Number:[phone] 08/01/2019, 10:50 AM  Clinical Narrative:     CSW followed up with RN to ascertain when the sitter was stopped yesterday. CSW was informed that it was not stopped until 7am this morning. CSW alerted facility of news and they stated that they cannot promise that they can hold the bed until tomorrow. CSW asked if they cannot hold the bed to alert CSW. CSW informed RN and MD of new information.  TOC will continue to follow for discharge planning needs.   Expected Discharge Plan: [place] Barriers to Discharge: Continued Medical Work up, Ship broker  Expected Discharge Plan and Services Expected Discharge Plan: [place] In-house Referral: NA Discharge Planning Services: NA Post Acute Care Choice: [place] arrangements for the past 2 months: Single Family Home                                       Social Determinants of Health (SDOH) Interventions    Readmission Risk Interventions No flowsheet data found.

## 2019-08-01 NOTE — Progress Notes (Signed)
[facility] NEPHROLOGY PROGRESS NOTE  Assessment/ Plan: Pt is a 78 y.o. yo male   with history of CAD, chronic back pain, thoracic aortic aneurysm who was initially admitted on 12/1 for AMS and abdominal pain due to cholecystitis, seen as a consultation for AKI.  #Acute kidney injury, nonoliguric likely obstructive uropathy and may have some hemodynamic changes. -UA likely traumatic.  Kidney US consistent with hydronephrosis. -Good urine output and creatinine level trending down. -Discontinue IVF, encourage oral intake. Expect full renal recovery.  #Acute urinary retention/hydronephrosis: On Flomax, finasteride and has Foley catheter.  Seen by urologist. Follow-up with urologist  #Metabolic acidosis: Improved, discontinue sodium bicarbonate p.o.  #Hypertension: Blood pressure acceptable.  On carvedilol.  #Acute metabolic encephalopathy: Monitor mental status.  Per primary team.  #Acute respiratory failure with hypoxia: He required mechanical ventilation, now on room air.  #NSTEMI: Seen by cardiology.  Planned for outpatient follow-up for possible cardiac cath.  Sign off, please call back with question.  Subjective: Seen and examined at bedside. Urine output is 3050 cc per. Reports feeling good. Denies nausea vomiting chest pain shortness of breath.  Objective Vital signs in last 24 hours: Vitals:   07/31/19 2123 08/01/19 0320 08/01/19 0438 08/01/19 1003  BP: 135/78  118/83 135/72  Pulse: 64  67 80  Resp: 12 17 13 18   Temp: 98.3 F (36.8 C)  97.8 F (36.6 C)   TempSrc: Oral  Oral   SpO2: 99%  99%   Weight:   74.3 kg   Height:       Weight change: 0.68 kg  Intake/Output Summary (Last 24 hours) at 08/01/2019 1235 Last data filed at 08/01/2019 0626 Gross per 24 hour  Intake 1613.31 ml  Output 2200 ml  Net -586.69 ml       Labs: Basic Metabolic Panel: Recent Labs  Lab 07/29/19 0409 07/30/19 0413 07/31/19 0451 08/01/19 0410  NA 142 141 140  140  K 4.0 4.3 4.5 4.4  CL 107 106 106 106  CO2 23 23 25 24   GLUCOSE 110* 111* 113* 104*  BUN 57* 57* 55* 53*  CREATININE 4.36* 4.36* 4.30* 3.90*  CALCIUM 7.9* 8.3* 8.2* 7.7*  PHOS 5.6* 5.1*  --  4.6   Liver Function Tests: Recent Labs  Lab 07/29/19 0409 07/30/19 0413 08/01/19 0410  ALBUMIN 2.6* 3.0* 2.5*   No results for input(s): LIPASE, AMYLASE in the last 168 hours. No results for input(s): AMMONIA in the last 168 hours. CBC: Recent Labs  Lab 07/27/19 0444 07/28/19 0617  WBC 10.4 6.8  HGB 10.9* 9.7*  HCT 30.8* 27.4*  MCV 97.8 97.2  PLT 190 142*   Cardiac Enzymes: No results for input(s): CKTOTAL, CKMB, CKMBINDEX, TROPONINI in the last 168 hours. CBG: Recent Labs  Lab 07/31/19 0719 07/31/19 1101 07/31/19 1623 07/31/19 2120 08/01/19 0808  GLUCAP 87 94 105* 139* 84    Iron Studies: No results for input(s): IRON, TIBC, TRANSFERRIN, FERRITIN in the last 72 hours. Studies/Results: No results found.  Medications: Infusions:   Scheduled Medications: . aspirin  81 mg Oral Daily  . atorvastatin  40 mg Oral q1800  . bisacodyl  5 mg Oral BID  . carvedilol  12.5 mg Oral BID WC  . Chlorhexidine Gluconate Cloth  6 each Topical Daily  . cloNIDine  0.1 mg Transdermal Weekly  . enoxaparin (LOVENOX) injection  30 mg Subcutaneous Q24H  . finasteride  5 mg Oral Daily  . lidocaine  1 application Urethral Once  .  mouth rinse  15 mL Mouth Rinse BID  . [START ON 08/02/2019] OLANZapine  5 mg Oral Daily  . pantoprazole  40 mg Oral QHS  . polyethylene glycol  17 g Oral Daily  . tamsulosin  0.4 mg Oral Daily    have reviewed scheduled and prn medications.  Physical Exam: General:NAD, comfortable, pleasant Heart:RRR, s1s2 nl Lungs: Clear b/l, no crackle Abdomen:soft, Non-tender, non-distended Extremities:No LE edema Foley catheter with clear urine.  Riley Eaton 08/01/2019,12:35 PM  LOS: 19 days  Pager: [phone]

## 2019-08-01 NOTE — Plan of Care (Signed)
  Problem: Education: Goal: Knowledge of General Education information will improve Description: Including pain rating scale, medication(s)/side effects and non-pharmacologic comfort measures Outcome: Progressing   Problem: Clinical Measurements: Goal: Will remain free from infection Outcome: Progressing Goal: Diagnostic test results will improve Outcome: Progressing Goal: Cardiovascular complication will be avoided Outcome: Progressing   Problem: Activity: Goal: Risk for activity intolerance will decrease Outcome: Progressing   Problem: Nutrition: Goal: Adequate nutrition will be maintained Outcome: Progressing   Problem: Coping: Goal: Level of anxiety will decrease Outcome: Progressing   Problem: Elimination: Goal: Will not experience complications related to bowel motility Outcome: Progressing   Problem: Pain Managment: Goal: General experience of comfort will improve Outcome: Progressing   Problem: Safety: Goal: Ability to remain free from injury will improve Outcome: Progressing   Problem: Skin Integrity: Goal: Risk for impaired skin integrity will decrease Outcome: Progressing   Problem: Coping: Goal: Coping ability will improve Outcome: Progressing   Elesa Hacker, RN

## 2019-08-01 NOTE — Progress Notes (Signed)
  Speech Language Pathology Treatment: Dysphagia  Patient Details Name: Riley Eaton MRN: [id number] DOB: [birth year] Today's Date: 08/01/2019 Time: 1117-3567 SLP Time Calculation (min) (ACUTE ONLY): 18 min  Assessment / Plan / Recommendation Clinical Impression  Visited pt during am meal. He was upright in chair. Prepared meal and used utensils appropriately. He is able to cut up his food, masticates solids without difficulty. Pt had thin water at bedside though on a nectar thick diet. He consumed both textures with single sips. SLp had provided one cue at the beginning of session to go "slow and steady" and pt was fully appropriate throughout the meal. Recommend upgrade to regular diet and thin liquids. Will sign off at this time as mentation and strength have dramatically improved and pt is now independent with PO.   HPI HPI: Riley Eaton is a 78 y.o. male who presented to ED 12/1 with abd pain and AMS.  Had been seen 1 day earlier and CT abd / pelv showed cholelithiasis without cholecystitis. He does have hx of chronic low back pain and chronic opioid use. Intubated 12/4-12/7       SLP Plan  All goals met       Recommendations  Diet recommendations: Regular;Thin liquid Liquids provided via: Cup;Straw Medication Administration: Whole meds with liquid Supervision: Patient able to self feed Compensations: Slow rate;Small sips/bites Postural Changes and/or Swallow Maneuvers: Seated upright 90 degrees                Plan: All goals met       GO               [name], MA CCC-SLP  Acute Rehabilitation Services Pager [phone] Office [phone]  Lynann Beaver 08/01/2019, 9:18 AM

## 2019-08-01 NOTE — Progress Notes (Signed)
PROGRESS NOTE  Riley Eaton [id number] DOB: 12/21/1940 DOA: 07/13/2019 PCP: Aletha Halim., PA-C  Brief History   Riley R Lawsonis a 78 y.o.malewith PMH including but not limited to thoracic aortic aneurysm, CAD, chronic back pain, chronic opioid use presented to [facility] ED 12/1 with altered mental status and abd pain. He had been seen in [facility] ED 1 day prior for abd pain. Patient had CT of the abd at the time that demonstrated cholelithiasis without cholecystitis. Surgery was consulted and recommended outpatient follow up. He was subsequently discharged home.  On 12/1 patient had worsening of pain along with AMS; therefore, came to [facility] ED. He was quite agitated per reports and received 1mg  dilaudid, 5mg  haldol, 1mg  ativan x 3 and had minimal response. PCCM was asked to see in consultation for consideration of precedex. Per chart review, he has no underlying hx of EtOH abuse or substance abuse. He does have hx of chronic low back pain and chronic opioid use. On review of outpatient med list, he had 5-325 norco prescribed for q6hrs PRN, 40mg  oxycontin TID, lyrica 200mg  TID, duloxetine 60mg  BID.  Per chart review, he had admission 09/24/18 through 10/06/18 for acute ascending cholangitis s/p ERCP and sphincterotomy with stenting of pancreatic duct (Dr. Therisa Doyne). During that admission, he did have severe delirium felt to be due to narcotic withdrawal. He did spend a few days in ICU and was briefly treated with precedex and fentanyl. He was discharged and supposed to be evaluated by surgery as an outpatient for cholecystectomy; however, this was postponed due to the [place] pandemic. Per discharge summary, his medication regimen was adjusted prior to d/c and pt had been tolerating it well (oxycodone weaned from 80mg  BID to BID, lyrica reduced from 200mg  TID to 100mg  TID, cymbalta from 60mg  BID to 60mg  daily).  Patient found to have acute metabolic encephalopathy, unknown cause.  Is also  noted to have acute respiratory failure and required intubation for airway protection.  Currently he is on room air.  There were complaints of abdominal pain however HIDA scan was unremarkable.  Patient also noted to have NSTEMI with elevated troponins over 2000.  Cardiology consulted, pending heart catheterization possibly later this week vs as an outpatient given his current renal failure and mental status. Nephrology has been consulted.  Plan is for the patient to go to SNF once a bed is available. He is no longer requiring a Actuary.  Consultants  . Cardiology . Nephrology . Gastroenterology . General Surgery . Interventional Radiation . PCCM  Procedures  . MRCP . Mechanical ventilation  Antibiotics   Anti-infectives (From admission, onward)   Start     Dose/Rate Route Frequency Ordered Stop   07/21/19 0100  vancomycin (VANCOCIN) 1,500 mg in sodium chloride 0.9 % 500 mL IVPB     1,500 mg 250 mL/hr over 120 Minutes Intravenous Every 12 hours 07/20/19 1608 07/26/19 1437   07/20/19 1018  cefTRIAXone (ROCEPHIN) 2 g in sodium chloride 0.9 % 100 mL IVPB  Status:  Discontinued     2 g 200 mL/hr over 30 Minutes Intravenous Every 24 hours 07/19/19 1115 07/20/19 0800   07/20/19 1018  cefTRIAXone (ROCEPHIN) 2 g in sodium chloride 0.9 % 100 mL IVPB  Status:  Discontinued     2 g 200 mL/hr over 30 Minutes Intravenous Every 24 hours 07/20/19 0800 07/20/19 0921   07/17/19 0100  vancomycin (VANCOCIN) IVPB 750 mg/150 ml premix  Status:  Discontinued     750 mg  150 mL/hr over 60 Minutes Intravenous Every 12 hours 07/16/19 1240 07/20/19 1608   07/16/19 1400  acyclovir (ZOVIRAX) 700 mg in dextrose 5 % 100 mL IVPB  Status:  Discontinued     10 mg/kg  69.9 kg 114 mL/hr over 60 Minutes Intravenous Every 8 hours 07/16/19 1240 07/19/19 1115   07/16/19 1245  cefTRIAXone (ROCEPHIN) 2 g in sodium chloride 0.9 % 100 mL IVPB  Status:  Discontinued     2 g 200 mL/hr over 30 Minutes Intravenous Every 12  hours 07/16/19 1240 07/19/19 1115   07/16/19 1245  vancomycin (VANCOCIN) 1,500 mg in sodium chloride 0.9 % 500 mL IVPB     1,500 mg 250 mL/hr over 120 Minutes Intravenous  Once 07/16/19 1240 07/16/19 1609   07/16/19 1245  ampicillin (OMNIPEN) 2 g in sodium chloride 0.9 % 100 mL IVPB  Status:  Discontinued     2 g 300 mL/hr over 20 Minutes Intravenous Every 4 hours 07/16/19 1240 07/19/19 1115   07/13/19 1900  cefTRIAXone (ROCEPHIN) 1 g in sodium chloride 0.9 % 100 mL IVPB  Status:  Discontinued     1 g 200 mL/hr over 30 Minutes Intravenous Every 24 hours 07/13/19 1825 07/16/19 1240   07/13/19 1900  metroNIDAZOLE (FLAGYL) IVPB 500 mg  Status:  Discontinued     500 mg 100 mL/hr over 60 Minutes Intravenous Every 8 hours 07/13/19 1825 07/20/19 0921     Subjective  The patient is sitting up in a chair at bedside. No new complaints.   Objective   Vitals:  Vitals:   08/01/19 0438 08/01/19 1003  BP: 118/83 135/72  Pulse: 67 80  Resp: 13 18  Temp: 97.8 F (36.6 C)   SpO2: 99%    Exam:  Constitutional:  . The patient is awake, alert, and oriented x 3. He is in no acute distress. Respiratory:  . No increased work of breathing. . No wheezes, rales, or rhonchi . No tactile fremitus Cardiovascular:  . Regular rate and rhythm . No murmurs, ectopy, or gallups. . No lateral PMI. No thrills. Abdomen:  . Abdomen is soft, non-tender, non-distended . No hernias, masses, or organomegaly . Normoactive bowel sounds.  Musculoskeletal:  . No cyanosis, clubbing, or edema Skin:  . No rashes, lesions, ulcers . palpation of skin: no induration or nodules Neurologic:  . CN 2-12 intact . Sensation all 4 extremities intact . Moving all extremities Psychiatric:  . Mental status: Pt is agitated. . judgment and insight not intact  I have personally reviewed the following:   Today's Data  . Vitals . BMP . CBC  Micro Data  . MRSA by PCR positive 07/19/2019.  Scheduled Meds: . aspirin   81 mg Oral Daily  . atorvastatin  40 mg Oral q1800  . bisacodyl  5 mg Oral BID  . carvedilol  12.5 mg Oral BID WC  . Chlorhexidine Gluconate Cloth  6 each Topical Daily  . cloNIDine  0.1 mg Transdermal Weekly  . enoxaparin (LOVENOX) injection  30 mg Subcutaneous Q24H  . finasteride  5 mg Oral Daily  . lidocaine  1 application Urethral Once  . mouth rinse  15 mL Mouth Rinse BID  . [START ON 08/02/2019] OLANZapine  5 mg Oral Daily  . pantoprazole  40 mg Oral QHS  . polyethylene glycol  17 g Oral Daily  . tamsulosin  0.4 mg Oral Daily   Continuous Infusions:   Active Problems:   Chronic low back pain  Choledocholithiasis   Acute metabolic encephalopathy   Mental status alteration   ACS (acute coronary syndrome) (HCC)   Endotracheal tube present   Alteration in nutrition   Encephalopathy   Abdominal pain   Non-ST elevation (NSTEMI) myocardial infarction ([place])   Hypokalemia   DCM (dilated cardiomyopathy) ([place])   LOS: 19 days   A & P  Acute metabolic encephalopathy: Much improved from previous. Not sure what baseline mental status is for this gentleman. He almost seems a little manic now. Previous work up of his mental status included CT head/MRI, lactate, procalcitonin, TSH ammonium levels. Blood cultures have had no growth. He is on chronic oxycodone for chronic pain. He continues to require a Actuary. This complicates his discharge to SNF. The patient is much better today and is not requiring a sitter.  Acute kidney injury and urinary retention: Creatinine 4.12 today. His baseline appears to be 1.7. Nephrology consulted as creatinine continues to rise.Losartan and spironolactone have been held. Renal ultrasound demonstrated mild to moderate hydronephrosis on the left. ; demonstrated. Urology has been consulted as well following a failed attempt to place a foley catheter following bladder scan with 999 of urine. 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 Coude catheter finally  placed by catheter team with 1.8 L of urine out. Cardiology had considered a possible LHC later this week, at this point they feel that he is a poor candidate for LHC. Possibly for outpatient after discharge.   Acute respiratory failure: Patient was intubated for airway protection. He also had aspiration pneumonia. Sputum culture was positive for MRSA. He had 10 days of IV vancomycin.  The patient is currently saturating at 98% on room air.   Abdominal pain: No current complaints of abdominal pain. The patient is s/p ERCP with sphincterotomy and pancreatic duct stenting in 09/25/2018. CT abdomen performed on this admission demonstrated cholelithiasis with no active disease. HIDA scan suggested chronic cholecystitis, no evidence for acute cholecystitis, and a patent cystic duct. LFT's are now within normal limits.   Prolonged QT: Avoid offending agents. Monitor potassium and magnesium.  Elevated troponin/NSTEMI: Troponin peaked at 2110 on 07/15/2019. EKG on 07/16/2019 showed deep T wave inversions in the inferior and anterior lateral leads. Echocardiogram 07/14/2019 shows an EF of 30 to 35%, mildly increased LV hypertrophy.  Grade 1 diastolic dysfunction. Cardiology consulted and appreciated. Patient noted to have coronary calcification on CT scan back in February 2020, but he has no current complaints of chest pain. Cardiology has signed off.  Dilated cardiomyopathy/acute systolic heart failure: Echocardiogram demonstrated EF of 30-35% with Grade 1 diastolic dysfunction and mildly increased LV hypertrophy. He is continued on coreg and telemetry monitoring. Cardiology has signed off.  Essential hypertension: Blood pressures remain labile on Coreg. Well controlled this morning. High this afternoon. Losartan, spironolactone held. Will increase dose of coreg.  Hypokalemia: Resolved. Monitor.  Dysphagia/poor oral intake: Pt is obsessed with liberating his diet. SLP has advanced him to a mechanical  soft diet.   Deconditioning/debility: Plan is for the patient to go to CIR when work up is completed and bed is available.  I have seen and examined this patient myself. I have spent 30 minutes in his evaluation and care.  DVT Prophylaxis: Lovenox Code Status: Full Family Communication: None at bedside Disposition Plan: SNF. However patient will need to be without 1 to 1 sitter for 24 hours before he can be discharged to SNF.  Brayleigh Rybacki, DO [facility] Direct contact: see [email or web address]  7PM-7AM contact night coverage as above 08/01/2019, 3:05 PM  LOS: 15 days

## 2019-08-02 DIAGNOSIS — M5440 Lumbago with sciatica, unspecified side: Secondary | ICD-10-CM

## 2019-08-02 LAB — BASIC METABOLIC PANEL
Anion gap: 11 (ref 5–15)
BUN: 51 mg/dL — ABNORMAL HIGH (ref 8–23)
CO2: 23 mmol/L (ref 22–32)
Calcium: 8.3 mg/dL — ABNORMAL LOW (ref 8.9–10.3)
Chloride: 104 mmol/L (ref 98–111)
Creatinine, Ser: 3.77 mg/dL — ABNORMAL HIGH (ref 0.61–1.24)
GFR calc Af Amer: 17 mL/min — ABNORMAL LOW (ref >60)
GFR calc non Af Amer: 14 mL/min — ABNORMAL LOW (ref >60)
Glucose, Bld: 103 mg/dL — ABNORMAL HIGH (ref 70–99)
Potassium: 4.4 mmol/L (ref 3.5–5.1)
Sodium: 138 mmol/L (ref 135–145)

## 2019-08-02 LAB — GLUCOSE, CAPILLARY
Glucose-Capillary: 227 mg/dL — ABNORMAL HIGH (ref 70–99)
Glucose-Capillary: 80 mg/dL (ref 70–99)
Glucose-Capillary: 99 mg/dL (ref 70–99)

## 2019-08-02 LAB — SARS CORONAVIRUS 2 (TAT 6-24 HRS): SARS Coronavirus 2: NEGATIVE

## 2019-08-02 MED ORDER — PANTOPRAZOLE SODIUM 40 MG PO TBEC: 40.0000 mg | DELAYED_RELEASE_TABLET | Freq: Every day | ORAL | 0 refills | Status: AC

## 2019-08-02 MED ORDER — CLONIDINE 0.1 MG/24HR TD PTWK: 0.1000 mg | MEDICATED_PATCH | TRANSDERMAL | 12 refills | Status: DC

## 2019-08-02 MED ORDER — ASPIRIN 81 MG PO CHEW: 81.0000 mg | CHEWABLE_TABLET | Freq: Every day | ORAL | 0 refills | Status: DC

## 2019-08-02 MED ORDER — POLYETHYLENE GLYCOL 3350 17 G PO PACK: 17.0000 g | PACK | Freq: Every day | ORAL | 0 refills | Status: DC

## 2019-08-02 MED ORDER — ATORVASTATIN CALCIUM 40 MG PO TABS: 40.0000 mg | ORAL_TABLET | Freq: Every day | ORAL | 0 refills | Status: AC

## 2019-08-02 MED ORDER — CARVEDILOL 12.5 MG PO TABS: 12.5000 mg | ORAL_TABLET | Freq: Two times a day (BID) | ORAL | 0 refills | Status: DC

## 2019-08-02 MED ORDER — OLANZAPINE 5 MG PO TABS: 5.0000 mg | ORAL_TABLET | Freq: Every day | ORAL | 0 refills | Status: DC

## 2019-08-02 NOTE — Discharge Summary (Addendum)
Physician Discharge Summary  VED MACKELLAR [id number] DOB: Apr 09, 1941 DOA: 07/13/2019  PCP: Aletha Halim., PA-C  Admit date: 07/13/2019 Discharge date: 08/02/2019  Recommendations for Outpatient Follow-up:  1. Discharge to SNF with PT/OT 2. Follow up with PCP in 7-10 days after discharge from SNF. 3. Follow up with urology in 2 weeks as outpatient 4. Follow up with nephrology as directed.   Contact information for after-discharge care    [facility] .   Service: Skilled Nursing Contact information: [place] [phone]             Discharge Diagnoses: Principal diagnosis is #1 Acute metabolic encephalopathy Acute kidney injury and urinary retention Acute hypoxic respiratory failure Abdominal pain Prolonged QT - resolved NSTEMI Dilated cardiomyopathy/Acute systolic heart failure Hypertension Hypokalemia Dysphagia/Poor oral intake Debility  Discharge Condition: Fair  Disposition: SNF  Diet recommendation: Heart healthy  Filed Weights   07/31/19 0700 08/01/19 0438 08/02/19 0508  Weight: 73.7 kg 74.3 kg 74.6 kg    History of present illness:  Riley Eaton is a 78 y.o. male with medical history significant of PMH/o chroniclow back pain, opioid use, thoracic aortic aneurysm, cholelithiasis who presents for evaluation via EMS for worsening abdominal pain. Per EMS, patient was seen at [facility] emergency department yesterday for abdominal pain. He had a CT scan at that time that showed cholelithiasis but no evidence of cholecystitis. Surgery was consulted and felt like he would be better managed for outpatient procedures. He was discharged home. EMS was called today when pain continue to worsen. He has had N/V, agitation along with increased and severe pain. Per his wife he is acting as he did last summer when he had cholelithiasis requiring ERCP and  stone retrieval.  Hemodynamically stable. Agitated requiring multiple doses of ativan and haldol. He has had marked pain treated with dilaudid. Due to agitation he was seen by CCM for possible admin of percedex. They did not think this was necessary but did order additional labs, including GGT, ordered, RUQ abdominal U/S,  which has been done revealing sludge but no evidence of acute cholecystitis,  and CT angio abdomen and CT head. CCM started Rocephin and Flagyl empirically. They recommended TR[facility] admit patient to step-down for continued evaluation: possible HIDA scan, repeat GS consult.  Hospital Course:  Riley Eaton a 78 y.o.malewith PMH including but not limited to thoracic aortic aneurysm, CAD, chronic back pain, chronic opioid use presented to [facility] ED 12/1 with altered mental status and abd pain. He had been seen in [facility] ED 1 day prior for abd pain. Patient had CT of the abd at the time that demonstrated cholelithiasis without cholecystitis. Surgery was consulted and recommended outpatient follow up. He was subsequently discharged home.  On 12/1 patient had worsening of pain along with AMS; therefore, came to [facility] ED. He was quite agitated per reports and received 1mg  dilaudid, 5mg  haldol, 1mg  ativan x 3 and had minimal response. PCCM was asked to see in consultation for consideration of precedex. Per chart review, he has no underlying hx of EtOH abuse or substance abuse. He does have hx of chronic low back pain and chronic opioid use. On review of outpatient med list, he had 5-325 norco prescribed for q6hrs PRN, 40mg  oxycontin TID, lyrica 200mg  TID, duloxetine 60mg  BID.  Per chart review, he had admission 09/24/18 through 10/06/18 for acute ascending cholangitis s/p ERCP and sphincterotomy with stenting of  pancreatic duct (Dr. Therisa Doyne). During that admission, he did have severe delirium felt to be due to narcotic withdrawal. He did spend a few days in ICU and was briefly treated with  precedex and fentanyl. He was discharged and supposed to be evaluated by surgery as an outpatient for cholecystectomy; however, this was postponed due to the [place] pandemic. Per discharge summary, his medication regimen was adjusted prior to d/c and pt had been tolerating it well (oxycodone weaned from 80mg  BID to BID, lyrica reduced from 200mg  TID to 100mg  TID, cymbalta from 60mg  BID to 60mg  daily).  Patient found to have acute metabolic encephalopathy, unknown cause. Is also noted to have acute respiratory failure and required intubation for airway protection. Currently he is on room air. There were complaints of abdominal pain however HIDA scan was unremarkable. Patient also noted to have NSTEMI with elevated troponins over 2000. Cardiology consulted, pending heart catheterization as outpatient after pt is recovered from acute illness. They have now signed off. Nephrology has been consulted as well due to the patient renal insufficiency which has stabilized.  Plan is for the patient to go to SNF once a bed is available. He is no longer requiring a Actuary.  Today's assessment: S: The patient is resting comfortably. No new complaints O: Vitals:  Vitals:   08/02/19 0508 08/02/19 0836  BP: 134/80 120/74  Pulse: 71 88  Resp: 14 20  Temp: 97.6 F (36.4 C) (!) 97.4 F (36.3 C)  SpO2:      Constitutional:   The patient is awake, alert, and oriented x 3. He is in no acute distress. Respiratory:   No increased work of breathing.  No wheezes, rales, or rhonchi  No tactile fremitus Cardiovascular:   Regular rate and rhythm  No murmurs, ectopy, or gallups.  No lateral PMI. No thrills. Abdomen:   Abdomen is soft, non-tender, non-distended  No hernias, masses, or organomegaly  Normoactive bowel sounds.  Musculoskeletal:   No cyanosis, clubbing, or edema Skin:   No rashes, lesions, ulcers  palpation of skin: no induration or nodules Neurologic:   CN 2-12  intact  Sensation all 4 extremities intact  Moving all extremities Psychiatric:   Mental status: Pt is calmer.  judgment and insight does not appear to be intact  Discharge Instructions  Discharge Instructions    Activity as tolerated - No restrictions   Complete by: As directed    Call MD for:  difficulty breathing, headache or visual disturbances   Complete by: As directed    Call MD for:  extreme fatigue   Complete by: As directed    Call MD for:  temperature >100.4   Complete by: As directed    Diet - low sodium heart healthy   Complete by: As directed    Discharge instructions   Complete by: As directed    Discharge to SNF with PT/OT Follow up with PCP in 7-10 days after discharge from SNF. Follow up with urology in 2 weeks as outpatient Follow up with nephrology as directed.   Increase activity slowly   Complete by: As directed      Allergies as of 08/02/2019   No Known Allergies     Medication List    STOP taking these medications   bisacodyl 10 MG suppository Commonly known as: DULCOLAX   calcium-vitamin D 500-200 MG-UNIT tablet Commonly known as: OSCAL WITH D   DULoxetine 60 MG capsule Commonly known as: CYMBALTA   HYDROcodone-acetaminophen 5-325 MG tablet Commonly  known as: NORCO/VICODIN   Movantik 25 MG Tabs tablet Generic drug: naloxegol oxalate   omeprazole 20 MG capsule Commonly known as: PRILOSEC   oxyCODONE 40 mg 12 hr tablet Commonly known as: OxyCONTIN   pregabalin 200 MG capsule Commonly known as: Lyrica   promethazine 25 MG tablet Commonly known as: PHENERGAN   TYLENOL PM EXTRA STRENGTH PO     TAKE these medications   allopurinol 100 MG tablet Commonly known as: ZYLOPRIM TAKE 1 TABLET BY MOUTH EVERY DAY What changed:   when to take this  reasons to take this   aspirin 81 MG chewable tablet Chew 1 tablet (81 mg total) by mouth daily.   atorvastatin 40 MG tablet Commonly known as: LIPITOR Take 1 tablet (40 mg  total) by mouth daily at 6 PM.   carvedilol 12.5 MG tablet Commonly known as: COREG Take 1 tablet (12.5 mg total) by mouth 2 (two) times daily with a meal.   cloNIDine 0.1 mg/24hr patch Commonly known as: CATAPRES - Dosed in mg/24 hr Place 1 patch (0.1 mg total) onto the skin once a week.   finasteride 5 MG tablet Commonly known as: PROSCAR Take 1 tablet (5 mg total) by mouth daily. For urination   OLANZapine 5 MG tablet Commonly known as: ZYPREXA Take 1 tablet (5 mg total) by mouth daily.   pantoprazole 40 MG tablet Commonly known as: PROTONIX Take 1 tablet (40 mg total) by mouth at bedtime.   polyethylene glycol 17 g packet Commonly known as: MIRALAX / GLYCOLAX Take 17 g by mouth daily.   tamsulosin 0.4 MG Caps capsule Commonly known as: FLOMAX Take 1 capsule (0.4 mg total) by mouth daily.   traZODone 50 MG tablet Commonly known as: DESYREL Take 50 mg by mouth at bedtime.      No Known Allergies  The results of significant diagnostics from this hospitalization (including imaging, microbiology, ancillary and laboratory) are listed below for reference.    Significant Diagnostic Studies: DG Abd 1 View  Result Date: 07/25/2019 CLINICAL DATA:  Generalized abdominal pain. EXAM: ABDOMEN - 1 VIEW COMPARISON:  July 16, 2019. FINDINGS: No abnormal bowel dilatation is noted. Large amount of stool seen throughout the colon. No abnormal calcifications are noted. IMPRESSION: Large stool burden is noted. No evidence of bowel obstruction or ileus. Electronically Signed   By: Marijo Conception M.D.   On: 07/25/2019 13:30   CT HEAD WO CONTRAST  Result Date: 07/15/2019 CLINICAL DATA:  Altered level of consciousness (LOC), unexplained EXAM: CT HEAD WITHOUT CONTRAST TECHNIQUE: Contiguous axial images were obtained from the base of the skull through the vertex without intravenous contrast. COMPARISON:  None. FINDINGS: Patient had difficulty tolerating the exam. Despite repeat  acquisition, there is moderate motion artifact. Brain: No evidence of intracranial hemorrhage, evaluation limited by motion artifact. No hydrocephalus or midline shift. No mass effect. No evidence of territorial ischemia motion limits more detailed assessment. Suggestion of chronic small vessel disease, however details obscured. Vascular: No gross hyperdense vessel. Skull: Significant motion limitations. No evidence of fracture or focal lesion. Sinuses/Orbits: No acute findings allowing for motion. Other: None. IMPRESSION: Significant motion limitations. No evidence of acute abnormality. Consider repeat exam based on clinical concern when patient is able to hold still. Electronically Signed   By: Keith Rake M.D.   On: 07/15/2019 13:00   MR BRAIN WO CONTRAST  Result Date: 07/20/2019 CLINICAL DATA:  Focal neuro deficit, greater than 6 hours, stroke suspected. EXAM: MRI HEAD WITHOUT  CONTRAST TECHNIQUE: Multiplanar, multiecho pulse sequences of the brain and surrounding structures were obtained without intravenous contrast. COMPARISON:  Brain MRI 07/17/2019, head CT 07/15/2019 FINDINGS: Brain: There is no evidence of acute infarct. No evidence of intracranial mass. No midline shift or extra-axial fluid collection. No chronic intracranial blood products. Mild scattered T2/FLAIR hyperintensity within cerebral white matter is nonspecific, but consistent with chronic small vessel ischemic disease. Tiny chronic lacunar infarct within the left caudate. Redemonstrated nonspecific T2/FLAIR hyperintensity within the deep gray nuclei. Mild generalized parenchymal atrophy. Vascular: Flow voids maintained within the proximal large arterial vessels. Skull and upper cervical spine: No focal marrow lesion. 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 Sinuses/Orbits: Visualized orbits demonstrate no acute abnormality. Trace ethmoid sinus mucosal thickening. Bilateral mastoid  effusions. IMPRESSION: 1. No evidence of acute intracranial abnormality, including acute infarct. 2. Mild generalized parenchymal atrophy and chronic small vessel ischemic disease. 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 3. Redemonstrated nonspecific T2/FLAIR hyperintensity within the bilateral deep gray nuclei. 4. Bilateral mastoid effusions. Electronically Signed   By: Kellie Simmering DO   On: 07/20/2019 21:19   MR BRAIN WO CONTRAST  Result Date: 07/17/2019 CLINICAL DATA:  78 year old male with recent unexplained altered mental status. Unresponsive this morning with periods of apnea. EXAM: MRI HEAD WITHOUT CONTRAST TECHNIQUE: Multiplanar, multiecho pulse sequences of the brain and surrounding structures were obtained without intravenous contrast. COMPARISON:  Head CT without contrast 08/11/2019. FINDINGS: Brain: No restricted diffusion to suggest acute infarction. No midline shift, mass effect, evidence of mass lesion, ventriculomegaly, extra-axial collection or acute intracranial hemorrhage. Cervicomedullary junction and pituitary are within normal limits. Cerebral volume is within normal limits for age. No cortical encephalomalacia or chronic cerebral blood products identified. 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 Other mild T2 heterogeneity in the deep gray nuclei is nonspecific, and not evident on DWI ir T1 weighted imaging. Negative brainstem and cerebellum. Minimal for age nonspecific cerebral white matter T2 and FLAIR hyperintensity. Vascular: Loss of the distal right vertebral artery flow void on series 10, image 1. Other Major intracranial vascular flow voids are preserved. Skull and upper cervical spine: Negative visible cervical spine. Visualized bone marrow signal is within normal limits. Sinuses/Orbits: Negative orbits. Trace paranasal sinus mucosal thickening. Other: Intubated. Small volume retained fluid in the pharynx. Trace mastoid fluid. Visible internal auditory  structures appear normal. Small suboccipital benign scalp lipoma. IMPRESSION: 1.  No acute intracranial abnormality identified. 2. Mild nonspecific T2 and FLAIR hyperintensity in the deep gray nuclei, with a tiny chronic lacune in the left caudate. 3. Otherwise negative brain for age. Electronically Signed   By: Genevie Ann M.D.   On: 07/17/2019 21:24   NM Hepatobiliary Liver Func  Result Date: 07/19/2019 CLINICAL DATA:  Right upper quadrant pain evaluate for cholecystitis. EXAM: NUCLEAR MEDICINE HEPATOBILIARY IMAGING TECHNIQUE: Sequential images of the abdomen were obtained out to 60 minutes following intravenous administration of radiopharmaceutical. RADIOPHARMACEUTICALS:  5.09 mCi Tc-40m  Choletec IV COMPARISON:  CT AP 07/18/2019 FINDINGS: Prompt uptake and biliary excretion of activity by the liver is seen. Biliary activity passes into small bowel, consistent with patent common bile duct. After 60 minutes of imaging no gallbladder activity visualized. The patient subsequently received 3 mg of morphine, IV. Imaging was carried out for an additional 30 minutes with subsequent visualization of the gallbladder reflecting patency of cystic duct. IMPRESSION: 1. Patent cystic duct without evidence for acute cholecystitis. 2. Delayed filling of  the gallbladder following morphine administration which may be seen with chronic cholecystitis. Electronically Signed   By: Kerby Moors M.D.   On: 07/19/2019 15:00   CT ABDOMEN PELVIS W CONTRAST  Result Date: 07/18/2019 CLINICAL DATA:  [age]-year-old male with abdominal pain and intermittent fevers. Gallstone. EXAM: CT ABDOMEN AND PELVIS WITH CONTRAST TECHNIQUE: Multidetector CT imaging of the abdomen and pelvis was performed using the standard protocol following bolus administration of intravenous contrast. CONTRAST:  15mL OMNIPAQUE IOHEXOL 300 MG/ML  SOLN COMPARISON:  CT abdomen pelvis dated 07/12/2019. FINDINGS: Evaluation is limited due to streak artifact caused by  patient's arms as well as due to respiratory motion artifact. Lower chest: Minimal bibasilar dependent atelectatic changes. Bilateral lower lobe streaky densities may represent atelectasis although developing infiltrate is not excluded. Clinical correlation is recommended. Multi vessel coronary vascular calcification primarily involving the LAD and left circumflex artery as well as involving the left main. No intra-abdominal free air or free fluid. Hepatobiliary: The liver is unremarkable. No intrahepatic biliary ductal dilatation. Small stones noted within the gallbladder. The gallbladder is mildly distended. No pericholecystic fluid or evidence of acute cholecystitis by CT. No calcified stone noted in the central CBD. Pancreas: There is fatty infiltration of the pancreas. No active inflammatory changes Spleen: Mild splenomegaly measuring 15 cm in craniocaudal length. Adrenals/Urinary Tract: The adrenal glands are unremarkable. There is no hydronephrosis on either side. There is symmetric enhancement and excretion of contrast by both kidneys. The visualized ureters appear unremarkable. The urinary bladder is predominantly decompressed around a Foley catheter. Minimal distension of the urinary bladder with excreted contrast. Mild trabeculated appearance of the bladder wall, likely related to chronic bladder outlet obstruction. Stomach/Bowel: Partially visualized enteric tube with tip in the distal esophagus. There is a moderate size hiatal hernia containing a portion of the stomach. There is mild thickened appearance of the wall the stomach which may be related to underdistention. Mild gastritis is not excluded. Clinical correlation is recommended. There is no bowel obstruction. The appendix is normal. Vascular/Lymphatic: Mild aortoiliac atherosclerotic disease. The aorta is tortuous. No aneurysmal dilatation or dissection. The IVC is grossly unremarkable. No portal venous gas. There is no adenopathy. Reproductive:  Enlarged prostate gland measuring approximately 6 cm in transverse axial diameter. The seminal vesicles are symmetric. Other: None Musculoskeletal: Osteopenia with scoliosis and degenerative changes of the spine. No acute osseous pathology. IMPRESSION: 1. Moderate size hiatal hernia containing a portion of the stomach. Mild thickened appearance of the wall of the stomach may be related to underdistention. Mild gastritis is not excluded. Clinical correlation is recommended. No bowel obstruction. Normal appendix. 2. Enteric tube with tip in the distal esophagus close to the GE junction within the hiatal hernia. 3. Cholelithiasis. 4. Mild splenomegaly. 5. Aortic Atherosclerosis (ICD10-I70.0). Electronically Signed   By: Anner Crete M.D.   On: 07/18/2019 15:35   CT ABDOMEN PELVIS W CONTRAST  Result Date: 07/12/2019 CLINICAL DATA:  Abdominal pain. Right upper quadrant abdominal pain with low back pain. EXAM: CT ABDOMEN AND PELVIS WITH CONTRAST TECHNIQUE: Multidetector CT imaging of the abdomen and pelvis was performed using the standard protocol following bolus administration of intravenous contrast. CONTRAST:  123mL OMNIPAQUE IOHEXOL 300 MG/ML  SOLN COMPARISON:  09/25/2018 FINDINGS: Lower chest: The lung bases are clear. The heart size is normal. Hepatobiliary: The liver is normal. The gallbladder is distended. There is cholelithiasis.There is no biliary ductal dilation. Pancreas: The pancreas is atrophic. Spleen: The spleen is borderline enlarged measuring approximately 12.5 cm  craniocaudad. The splenic vein is patent. Adrenals/Urinary Tract: --Adrenal glands: No adrenal hemorrhage. --Right kidney/ureter: No hydronephrosis or perinephric hematoma. --Left kidney/ureter: No hydronephrosis or perinephric hematoma. --Urinary bladder: The urinary bladder is distended. Stomach/Bowel: --Stomach/Duodenum: There is a large hiatal hernia. --Small bowel: No dilatation or inflammation. --Colon: No focal abnormality.  --Appendix: Normal. Vascular/Lymphatic: Atherosclerotic calcification is present within the non-aneurysmal abdominal aorta, without hemodynamically significant stenosis. --No retroperitoneal lymphadenopathy. --No mesenteric lymphadenopathy. --No pelvic or inguinal lymphadenopathy. Reproductive: The prostate gland is enlarged. Other: No ascites or free air. The abdominal wall is normal. Musculoskeletal. There are advanced degenerative changes throughout the visualized lumbar spine, greatest at the L3-L4 level. There is a degenerative dextroscoliosis centered at the L2-L3 level. There is no displaced fracture. IMPRESSION: 1. Distended gallbladder with cholelithiasis. No biliary ductal dilation. No CT evidence for acute cholecystitis. 2. Borderline splenomegaly. 3. Large hiatal hernia. 4. Aortic atherosclerosis. Aortic Atherosclerosis (ICD10-I70.0). Electronically Signed   By: Constance Holster M.D.   On: 07/12/2019 16:06   US RENAL  Result Date: 07/27/2019 CLINICAL DATA:  Acute renal injury. EXAM: RENAL / URINARY TRACT ULTRASOUND COMPLETE COMPARISON:  CT 07/18/2019. FINDINGS: Right Kidney: Renal measurements: Not visualized due to bowel gas. Left Kidney: Renal measurements: [phone] cm = volume: 29.0 mL. Echogenicity within normal limits. No mass. Mild to moderate left hydronephrosis visualized. Bladder: Bladder is distended.  Patient could not void. Other: None. IMPRESSION: 1. Right kidney not visualized due to overlying bowel gas. Mild to moderate left hydronephrosis. 2.  Bladder distended patient could not void. Electronically Signed   By: Marcello Moores  Register   On: 07/27/2019 09:01   DG CHEST PORT 1 VIEW  Result Date: 07/17/2019 CLINICAL DATA:  Orogastric tube placement EXAM: PORTABLE CHEST 1 VIEW COMPARISON:  Portable exam [id number] hours compared to 07/16/2019 Correlation: CT abdomen and pelvis 07/12/2019 FINDINGS: Tip of endotracheal tube projects 3.1 cm above carina. Tip of nasogastric tube projects over  medial LEFT lower chest, by prior CT likely within previously seen hiatal hernia. Minimal enlargement of cardiac silhouette. Atherosclerotic calcification aorta. Subsegmental atelectasis LEFT base. Remaining lungs clear. No pleural effusion or pneumothorax. IMPRESSION: Tip of orogastric tube projects over probable hiatal hernia. Subsegmental atelectasis LEFT lung base. Electronically Signed   By: Lavonia Dana M.D.   On: 07/17/2019 17:43   Portable Chest x-ray  Result Date: 07/16/2019 CLINICAL DATA:  Endotracheal tube. EXAM: PORTABLE CHEST 1 VIEW COMPARISON:  09/24/2018. FINDINGS: Endotracheal tube terminates 7.4 cm above the carina. Nasogastric tube is coiled within a hiatal hernia. Heart size stable. Lungs are clear. No pleural fluid. IMPRESSION: 1. Endotracheal tube terminates 7.4 cm above the carina. 2. Nasogastric tube is coiled in a hiatal hernia. 3. No acute pulmonary parenchymal findings. Electronically Signed   By: Lorin Picket M.D.   On: 07/16/2019 12:47   DG Abd Portable 1V  Result Date: 07/16/2019 CLINICAL DATA:  OG tube placement. EXAM: PORTABLE ABDOMEN - 1 VIEW COMPARISON:  07/13/2019 FINDINGS: Single abdominal radiograph excluding part of the pelvis shows an OG tube with the tip just below the left hemidiaphragm in terms of projection. The patient has a large hiatal hernia with inverted stomach, extending into the chest. Therefore it is difficult to determine whether this tube is at the EG junction or beyond. Injection of a small amount of water-soluble contrast with follow-up radiograph may be helpful to determine placement. Spinal degenerative changes are present. No signs of acute bone finding. Marked dextro convexity of the spine. IMPRESSION: 1. OG tube likely within  intrathoracic stomach. Exact side port and tip location difficult to determine given gastric position. Injection of a small amount of water-soluble contrast with follow-up radiograph could potentially be of benefit.  Electronically Signed   By: Zetta Bills M.D.   On: 07/16/2019 12:52   DG Abd Portable 1 View  Result Date: 07/13/2019 CLINICAL DATA:  Abdominal pain. EXAM: PORTABLE ABDOMEN - 1 VIEW COMPARISON:  09/28/2018 FINDINGS: Bowel gas pattern is normal. Severe degenerative changes in the lumbar spine at L3-4. Contrast in the bladder from the CT scan performed yesterday. IMPRESSION: Benign-appearing abdomen. Electronically Signed   By: Lorriane Shire M.D.   On: 07/13/2019 17:41   DG Swallowing Func-Speech Pathology  Result Date: 07/26/2019 Objective Swallowing Evaluation: Type of Study: MBS-Modified Barium Swallow Study  Patient Details Name: Riley Eaton MRN: [id number] Date of Birth: 1940/09/28 Today's Date: 07/26/2019 Time: SLP Start Time (ACUTE ONLY): 0940 -SLP Stop Time (ACUTE ONLY): 1010 SLP Time Calculation (min) (ACUTE ONLY): 30 min Past Medical History: Past Medical History: Diagnosis Date  Chronic back pain   Chronic low back pain 05/19/2017  Chronic, continuous use of opioids   for back pain  Coronary artery calcification seen on CT scan   Nerve pain   Thoracic aortic aneurysm [facility])  Past Surgical History: Past Surgical History: Procedure Laterality Date  BACK SURGERY    09-2016  back surgey    ENDOSCOPIC RETROGRADE CHOLANGIOPANCREATOGRAPHY (ERCP) WITH PROPOFOL N/A 09/25/2018  Procedure: ENDOSCOPIC RETROGRADE CHOLANGIOPANCREATOGRAPHY (ERCP) WITH PROPOFOL;  Surgeon: Ronnette Juniper, MD;  Location: [place];  Service: Gastroenterology;  Laterality: N/A;  PANCREATIC STENT PLACEMENT  09/25/2018  Procedure: PANCREATIC STENT PLACEMENT;  Surgeon: Ronnette Juniper, MD;  Location: [facility] ENDOSCOPY;  Service: Gastroenterology;;  REMOVAL OF STONES  09/25/2018  Procedure: REMOVAL OF STONES;  Surgeon: Ronnette Juniper, MD;  Location: [facility] ENDOSCOPY;  Service: Gastroenterology;;  [name]  09/25/2018  Procedure: [name];  Surgeon: Ronnette Juniper, MD;  [facility] ENDOSCOPY;  Service: Gastroenterology;; HPI:  DMAURI HUSSEIN is a 78 y.o. male who presented to ED 12/1 with abd pain and AMS.  Had been seen 1 day earlier and CT abd / pelv showed cholelithiasis without cholecystitis. He does have hx of chronic low back pain and chronic opioid use. Intubated 12/4-12/7  Subjective: Pt was seen in radiology for repeat MBS to determine appropriateness for advanced consistencies. Assessment / Plan / Recommendation CHL IP CLINICAL IMPRESSIONS 07/26/2019 Clinical Impression Pt demonstrates improvement in oropharyngeal swallow when compared to MBS completed 07/21/2019. Pt cognitive impairment continues to exacerbate sensory and motor issues. Pt requires cues for small bites/sips and slow rate. Orally, pt exhibited tolerance of all consistencies. Extended oral prep of solid texture was noted, as well as missing dentition. Pharyngeally, pt exhibits delayed swallow across consistencies, with trigger at the level of the pyriform sinus on thin and nectar thick liquids, and at the vallecula on puree and solid textures. Deep flash penetration to the level of the vocal folds was seen during the swallow of thin liquids via cup, with no cough response. No penetration or aspiration seen on nectar thick liquid, puree, or solid consistencies. Will advance diet to dys 2 with nectar thick liquids, crushed meds, full supervision during po intake for assistance with self feeding. Safe swallow precautions were updated and sent with transport to post at head of the bed. SLP will continue to follow to provide education and assess diet tolerance.  SLP Visit Diagnosis Dysphagia, oropharyngeal phase (R13.12)     Impact on safety and  function Moderate aspiration risk   CHL IP TREATMENT RECOMMENDATION 07/26/2019 Treatment Recommendations Therapy as outlined in treatment plan below   Prognosis 07/26/2019 Prognosis for Safe Diet Advancement Fair Barriers to Reach Goals Cognitive deficits   CHL IP DIET RECOMMENDATION 07/26/2019 SLP Diet Recommendations  Dysphagia 2 (Fine chop) solids;Nectar thick liquid Liquid Administration via Cup;Straw Medication Administration Crushed with puree Compensations Slow rate;Small sips/bites Postural Changes Remain semi-upright after after feeds/meals (Comment);Seated upright at 90 degrees   CHL IP OTHER RECOMMENDATIONS 07/26/2019   Oral Care Recommendations Oral care QID Other Recommendations Order thickener from pharmacy;Have oral suction available   CHL IP FOLLOW UP RECOMMENDATIONS 07/26/2019 Follow up Recommendations Skilled Nursing facility;24 hour supervision/assistance   CHL IP FREQUENCY AND DURATION 07/26/2019 Speech Therapy Frequency (ACUTE ONLY) min 2x/week Treatment Duration 2 weeks      CHL IP ORAL PHASE 07/26/2019 Oral Phase Impaired   Oral - Mech Soft Impaired mastication;Delayed oral transit    CHL IP PHARYNGEAL PHASE 07/26/2019 Pharyngeal Phase Impaired   Pharyngeal- Nectar Teaspoon Delayed swallow initiation-pyriform sinuses   Pharyngeal- Thin Cup Delayed swallow initiation-pyriform sinuses;Reduced airway/laryngeal closure;Penetration/Aspiration during swallow Pharyngeal Material enters airway, CONTACTS cords and not ejected out   Pharyngeal- Puree Delayed swallow initiation-vallecula   Pharyngeal- Mechanical Soft Delayed swallow initiation-vallecula  CHL IP CERVICAL ESOPHAGEAL PHASE 07/26/2019 Cervical Esophageal Phase [facility] Enriqueta Shutter, MSP, CCC-SLP Speech Language Pathologist Office: [phone] Pager: [phone] Shonna Chock 07/26/2019, 10:28 AM              DG Swallowing Func-Speech Pathology  Result Date: 07/21/2019 Objective Swallowing Evaluation: Type of Study: MBS-Modified Barium Swallow Study  Patient Details Name: Riley Eaton MRN: [id number] Date of Birth: 02/17/1941 Today's Date: 07/21/2019 Time: SLP Start Time (ACUTE ONLY): 0910 -SLP Stop Time (ACUTE ONLY): [id number] SLP Time Calculation (min) (ACUTE ONLY): 40 min Past Medical History: Past Medical History: Diagnosis Date  Chronic back pain    Chronic low back pain 05/19/2017  Chronic, continuous use of opioids   for back pain  Coronary artery calcification seen on CT scan   Nerve pain   Thoracic aortic aneurysm [facility])  Past Surgical History: Past Surgical History: Procedure Laterality Date  BACK SURGERY    09-2016  back surgey    ENDOSCOPIC RETROGRADE CHOLANGIOPANCREATOGRAPHY (ERCP) WITH PROPOFOL N/A 09/25/2018  Procedure: ENDOSCOPIC RETROGRADE CHOLANGIOPANCREATOGRAPHY (ERCP) WITH PROPOFOL;  Surgeon: Ronnette Juniper, MD;  Location: [place];  Service: Gastroenterology;  Laterality: N/A;  PANCREATIC STENT PLACEMENT  09/25/2018  Procedure: PANCREATIC STENT PLACEMENT;  Surgeon: Ronnette Juniper, MD;  Location: [facility] ENDOSCOPY;  Service: Gastroenterology;;  REMOVAL OF STONES  09/25/2018  Procedure: REMOVAL OF STONES;  Surgeon: Ronnette Juniper, MD;  Location: [facility] ENDOSCOPY;  Service: Gastroenterology;;  [name]  09/25/2018  Procedure: [name];  Surgeon: Ronnette Juniper, MD;  Location: [facility] ENDOSCOPY;  Service: Gastroenterology;; HPI: Riley Eaton is a 78 y.o. male who presented to ED 12/1 with abd pain and AMS.  Had been seen 1 day earlier and CT abd / pelv showed cholelithiasis without cholecystitis. He does have hx of chronic low back pain and chronic opioid use. Intubated 12/4-12/7. 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 Pt experiences silent aspiration with thin, nectar and honey textures when bolus arrives at the pyriform sinuses  just prior to swallow initiation, which pushes bolus into the laryngeal vestibule. Attempted controlling bolus size with all textures, only beneficial with honey teaspoon. Did not attempt positional strategies because pt was less  attentive today, needed max verbal cues to clear throat or swallow again to clear aspirate, he was also reporting nausea and trying to refuse boluses. Pt may demosntrate improved performance with further recovery. For now, recommend puree textuers and honey thick liquids.  SLP Visit Diagnosis Dysphagia, oropharyngeal phase (R13.12) Attention and concentration deficit following -- Frontal lobe and executive function deficit following -- Impact on safety and function Moderate aspiration risk   CHL IP TREATMENT RECOMMENDATION 07/21/2019 Treatment Recommendations Defer until completion of intrumental exam   Prognosis 09/29/2018 Prognosis for Safe Diet Advancement Good Barriers to Reach Goals -- Barriers/Prognosis Comment -- CHL IP DIET RECOMMENDATION 07/21/2019 SLP Diet Recommendations Dysphagia 1 (Puree) solids;Honey thick liquids Liquid Administration via Spoon Medication Administration -- Compensations Slow rate;Small sips/bites Postural Changes --   CHL IP OTHER RECOMMENDATIONS 07/21/2019 Recommended Consults -- Oral Care Recommendations Oral care QID Other Recommendations Order thickener from pharmacy;Have oral suction available   CHL IP FOLLOW UP RECOMMENDATIONS 07/21/2019 Follow up Recommendations Skilled Nursing facility   [facility] IP FREQUENCY AND DURATION 07/21/2019 Speech Therapy Frequency (ACUTE ONLY) min 2x/week Treatment Duration 2 weeks      CHL IP ORAL PHASE 07/21/2019 Oral Phase Impaired Oral - Pudding Teaspoon -- Oral - Pudding Cup -- Oral - Honey Teaspoon Right anterior bolus loss;Right pocketing in lateral sulci Oral - Honey Cup Right anterior bolus loss;Right pocketing in lateral sulci Oral - Nectar Teaspoon Right anterior bolus loss;Right pocketing in lateral sulci Oral - Nectar Cup Right anterior bolus loss;Right pocketing in lateral sulci Oral - Nectar Straw Right anterior bolus loss;Right pocketing in lateral sulci Oral - Thin Teaspoon -- Oral - Thin Cup Right anterior bolus loss;Right pocketing in lateral  sulci Oral - Thin Straw Right anterior bolus loss;Right pocketing in lateral sulci Oral - Puree Right anterior bolus loss;Right pocketing in lateral sulci Oral - Mech Soft -- Oral - Regular Right anterior bolus loss;Right pocketing in lateral sulci Oral - Multi-Consistency -- Oral - Pill -- Oral Phase - Comment --  CHL IP PHARYNGEAL PHASE 07/21/2019 Pharyngeal Phase Impaired Pharyngeal- Pudding Teaspoon -- Pharyngeal -- Pharyngeal- Pudding Cup -- Pharyngeal -- Pharyngeal- Honey Teaspoon Delayed swallow initiation-vallecula Pharyngeal Material does not enter airway Pharyngeal- Honey Cup Delayed swallow initiation-pyriform sinuses;Penetration/Aspiration during swallow Pharyngeal Material enters airway, passes BELOW cords without attempt by patient to eject out (silent aspiration) Pharyngeal- Nectar Teaspoon Penetration/Aspiration during swallow Pharyngeal Material enters airway, passes BELOW cords without attempt by patient to eject out (silent aspiration) Pharyngeal- Nectar Cup Penetration/Aspiration during swallow;Delayed swallow initiation-pyriform sinuses Pharyngeal Material enters airway, passes BELOW cords without attempt by patient to eject out (silent aspiration) Pharyngeal- Nectar Straw Penetration/Aspiration during swallow Pharyngeal Material enters airway, passes BELOW cords without attempt by patient to eject out (silent aspiration) Pharyngeal- Thin Teaspoon -- Pharyngeal -- Pharyngeal- Thin Cup Penetration/Aspiration during swallow Pharyngeal Material enters airway, passes BELOW cords without attempt by patient to eject out (silent aspiration) Pharyngeal- Thin Straw Penetration/Aspiration during swallow Pharyngeal Material enters airway, passes BELOW cords without attempt by patient to eject out (silent aspiration) Pharyngeal- Puree Delayed swallow initiation-vallecula Pharyngeal -- Pharyngeal- Mechanical Soft Delayed swallow initiation-vallecula Pharyngeal -- Pharyngeal- Regular -- Pharyngeal --  Pharyngeal- Multi-consistency -- Pharyngeal -- Pharyngeal- Pill -- Pharyngeal -- Pharyngeal Comment --  No flowsheet data found. DeBlois, Katherene Ponto 07/21/2019, 12:28 PM  ECHOCARDIOGRAM COMPLETE  Result Date: 07/15/2019   ECHOCARDIOGRAM REPORT   Patient Name:   KRISTAPHER SLAGOWSKI Date of Exam: 07/14/2019 Medical Rec #:  [id number]         Height:       67.0 in Accession #:    [id number]        Weight:       162.0 lb Date of Birth:  [birth year]         BSA:          1.85 m Patient Age:    29 years          BP:           160/99 mmHg Patient Gender: M                 HR:           76 bpm. Exam Location:  Inpatient Procedure: 2D Echo Indications:    Elevated Troponin  History:        Patient has no prior history of Echocardiogram examinations.                 Risk Factors:Hypertension. NSTEMI, delirium.  Sonographer:    Darlina Sicilian RDCS Referring Phys: [id number] [place]  Sonographer Comments: Very difficult images due to patients delirium IMPRESSIONS  1. Technically difficult study. Left ventricular ejection fraction, by visual estimation, is 30 to 35%. The left ventricle has grossly severely decreased function. Images are not sufficient to assess for regional wall motion abnormalities. There is mildly increased left ventricular hypertrophy.  2. Left ventricular diastolic parameters are consistent with Grade I diastolic dysfunction (impaired relaxation).  3. Global right ventricle has normal systolic function.The right ventricular size is normal.  4. The mitral valve is normal in structure. No evidence of mitral valve regurgitation.  5. The tricuspid valve is not well visualized. Tricuspid valve regurgitation is not demonstrated.  6. The aortic valve was not well visualized. Aortic valve regurgitation is not visualized.  7. The pulmonic valve was not well visualized. Pulmonic valve regurgitation is not visualized. FINDINGS  Left Ventricle: Left ventricular ejection fraction, by visual  estimation, is 30 to 35%. The left ventricle has moderate to severely decreased function. The left ventricular internal cavity size was the left ventricle is normal in size. There is mildly increased left ventricular hypertrophy. Left ventricular diastolic parameters are consistent with Grade I diastolic dysfunction (impaired relaxation). Right Ventricle: The right ventricular size is normal. No increase in right ventricular wall thickness. Global RV systolic function is has normal systolic function. Left Atrium: Left atrial size was not well visualized. Right Atrium: Right atrial size was not well visualized Pericardium: There is no evidence of pericardial effusion. Mitral Valve: The mitral valve is normal in structure. No evidence of mitral valve regurgitation. Tricuspid Valve: The tricuspid valve is not well visualized. Tricuspid valve regurgitation is not demonstrated. Aortic Valve: The aortic valve was not well visualized. Aortic valve regurgitation is not visualized. Pulmonic Valve: The pulmonic valve was not well visualized. Pulmonic valve regurgitation is not visualized. Aorta: The aortic root is normal in size and structure. IAS/Shunts: The interatrial septum was not well visualized.   Diastology LV e' lateral:   5.66 cm/s LV E/e' lateral: 8.2 LV e' medial:    4.68 cm/s LV E/e' medial:  10.0  MITRAL VALVE MV Area (PHT): 3.72 cm MV PHT:        59.16 msec MV Decel Time: 204 msec MV E velocity:  46.60 cm/s  103 cm/s MV A velocity: 110.00 cm/s 70.3 cm/s MV E/A ratio:  0.42        1.5  Oswaldo Milian MD Electronically signed by Oswaldo Milian MD Signature Date/Time: 07/15/2019/2:32:03 AM    Final    US Abdomen Limited RUQ  Result Date: 07/13/2019 CLINICAL DATA:  78 year old male with history of abdominal pain. EXAM: ULTRASOUND ABDOMEN LIMITED RIGHT UPPER QUADRANT COMPARISON:  Right upper quadrant abdominal ultrasound 09/25/2018. FINDINGS: Gallbladder: Amorphous nonshadowing echogenic material in  the gallbladder, compatible with biliary sludge. Additional 9 mm echogenic focus with no posterior acoustic shadowing lying dependently, compatible with a sludge ball. Gallbladder is moderately distended. Gallbladder wall thickness is normal at 2.7 mm. No pericholecystic fluid. Per report from the sonographer there was no sonographic Murphy's sign on examination. Common bile duct: Diameter: 3.7 mm Liver: No focal lesion identified. Within normal limits in parenchymal echogenicity. Portal vein is patent on color Doppler imaging with normal direction of blood flow towards the liver. Other: None. IMPRESSION: 1. Biliary sludge and biliary sludge ball in the gallbladder. No findings to suggest an acute cholecystitis at this time. Electronically Signed   By: Vinnie Langton M.D.   On: 07/13/2019 19:55    Microbiology: Recent Results (from the past 240 hour(s))  SARS CORONAVIRUS 2 (TAT 6-24 HRS) Nasopharyngeal Nasopharyngeal Swab     Status: None   Collection Time: 08/01/19  8:30 PM   Specimen: Nasopharyngeal Swab  Result Value Ref Range Status   SARS Coronavirus 2 NEGATIVE NEGATIVE Final    Comment: (NOTE) SARS-CoV-2 target nucleic acids are NOT DETECTED. The SARS-CoV-2 RNA is generally detectable in upper and lower respiratory specimens during the acute phase of infection. Negative results do not preclude SARS-CoV-2 infection, do not rule out co-infections with other pathogens, and should not be used as the sole basis for treatment or other patient management decisions. Negative results must be combined with clinical observations, patient history, and epidemiological information. The expected result is Negative. Fact Sheet for Patients: SugarRoll.be Fact Sheet for Healthcare Providers: https://www.woods-mathews.com/ This test is not yet approved or cleared by the [place] FDA and  has been authorized for detection and/or diagnosis of SARS-CoV-2  by FDA under an Emergency Use Authorization (EUA). This EUA will remain  in effect (meaning this test can be used) for the duration of the COVID-19 declaration under Section 56 4(b)(1) of the Act, 21 U.S.C. section 360bbb-3(b)(1), unless the authorization is terminated or revoked sooner. Performed at [facility], [place] [address] [place], [place] [address]      Labs: Basic Metabolic Panel: Recent Labs  Lab 07/27/19 0444 07/27/19 1105 07/29/19 0409 07/30/19 0413 07/31/19 0451 08/01/19 0410 08/02/19 0344  NA 138 134* 142 141 140 140 138  K 4.4 3.8 4.0 4.3 4.5 4.4 4.4  CL 106 106 107 106 106 106 104  CO2 22 18* 23 23 25 24 23   GLUCOSE 119* 213* 110* 111* 113* 104* 103*  BUN 38* 43* 57* 57* 55* 53* 51*  CREATININE 2.92* 3.27* 4.36* 4.36* 4.30* 3.90* 3.77*  CALCIUM 8.3* 8.1* 7.9* 8.3* 8.2* 7.7* 8.3*  MG 2.2  --   --   --   --   --   --   PHOS  --  5.1* 5.6* 5.1*  --  4.6  --    Liver Function Tests: Recent Labs  Lab 07/27/19 1105 07/29/19 0409 07/30/19 0413 08/01/19 0410  ALBUMIN 3.0* 2.6* 3.0* 2.5*   No results  for input(s): LIPASE, AMYLASE in the last 168 hours. No results for input(s): AMMONIA in the last 168 hours. CBC: Recent Labs  Lab 07/27/19 0444 07/28/19 0617  WBC 10.4 6.8  HGB 10.9* 9.7*  HCT 30.8* 27.4*  MCV 97.8 97.2  PLT 190 142*   Cardiac Enzymes: No results for input(s): CKTOTAL, CKMB, CKMBINDEX, TROPONINI in the last 168 hours. BNP: BNP (last 3 results) No results for input(s): BNP in the last 8760 hours.  ProBNP (last 3 results) No results for input(s): PROBNP in the last 8760 hours.  CBG: Recent Labs  Lab 08/01/19 1626 08/01/19 2058 08/02/19 0039 08/02/19 0504 08/02/19 0833  GLUCAP 87 126* 80 99 227*    Active Problems:   Chronic low back pain   Choledocholithiasis   Acute metabolic encephalopathy   Mental status alteration   ACS (acute coronary syndrome) (HCC)   Endotracheal tube present   Alteration in nutrition    Encephalopathy   Abdominal pain   Non-ST elevation (NSTEMI) myocardial infarction (HCC)   Hypokalemia   DCM (dilated cardiomyopathy) ([place])   Time coordinating discharge: 38 minutes.  Signed:        Zedric Deroy, DO [facility]  08/02/2019, 10:02 AM

## 2019-08-02 NOTE — Progress Notes (Signed)
Occupational Therapy Treatment Patient Details Name: ARVELL MALTEZ MRN: [id number] DOB: 1940-11-13 Today's Date: 08/02/2019    History of present illness Nimesh R Newhart is a 78 y.o. male who presented to ED 12/1 with abd pain and AMS.  Had been seen 1 day earlier and CT abd / pelv showed cholelithiasis without cholecystitis. Intubated 12/4-12/7. PHMx: Thoracic aortic aneurysm; Chronic low back pain; Choledocholithiasis; Chronic pain; Acute metabolic encephalopathy; ACS. 12/3 CT head: Significant motion limitations. No evidence of acute abnormality   OT comments  Patient seated in recliner upon arrival, asks therapist if he is going to rehab this afternoon. Pt agreeable to OT. Patient participate in functional ambulation this session using 4WW. Patient able to follow directions this session, less impulsive with standing and waiting for therapist to be ready before ambulating. Patient min guard with sit to stand, min A with ambulation for safety negotiating obstacles in his room. Patient much more appropriate this session, able to safety turn in hallway and locate his room number. Pt did require verbal cues for safety with transferring back to recliner as patient does not turn around with 4WW and attempts to sit, redirect patient that catheter bag is still hanging from 4WW. Pt progressing towards goals, POC updated. Will continue with acute OT services.    Follow Up Recommendations  SNF;Supervision/Assistance - 24 hour    Equipment Recommendations  Other (comment)(TBD at next venue)       Precautions / Restrictions Precautions Precautions: Fall Precaution Comments: HOH Restrictions Weight Bearing Restrictions: No       Mobility Bed Mobility               General bed mobility comments: seated in recliner upon arrival  Transfers Overall transfer level: Needs assistance Equipment used: 4-wheeled walker Transfers: Sit to/from Stand Sit to Stand: Min guard          General transfer comment: verbal cues for safety when transferring back to recliner, patient does not turn 4WW instead walks around it. OT redirect patient as his catheter bag is still attached to the 4WW.    Balance Overall balance assessment: Needs assistance Sitting-balance support: No upper extremity supported;Feet supported Sitting balance-Leahy Scale: Good     Standing balance support: Bilateral upper extremity supported;During functional activity Standing balance-Leahy Scale: Poor Standing balance comment: reliant on external support                           ADL either performed or assessed with clinical judgement   ADL                                       Functional mobility during ADLs: Min A [id number]) General ADL Comments: pt reports already performing g/h, used bathroom this AM.                Cognition Arousal/Alertness: Awake/alert Behavior During Therapy: WFL for tasks assessed/performed Overall Cognitive Status: Impaired/Different from baseline                           Safety/Judgement: Decreased awareness of safety     General Comments: patient much less impulsive this session, able to stay on task. pt able to recall today's date when asked. decreased safety with transfer back to recliner/awareness of catheter.  Pertinent Vitals/ Pain       Pain Assessment: Faces Faces Pain Scale: Hurts little more Pain Location: back Pain Descriptors / Indicators: Aching Pain Intervention(s): Limited activity within patient's tolerance;Monitored during session         Frequency  Min 2X/week        Progress Toward Goals  OT Goals(current goals can now be found in the care plan section)  Progress towards OT goals: Progressing toward goals  Acute Rehab OT Goals Patient Stated Goal: to go to rehab OT Goal Formulation: With patient Time For Goal Achievement: 08/12/19 Potential to Achieve Goals:  Good ADL Goals Pt Will Perform Grooming: with supervision;sitting;standing Pt Will Perform Upper Body Bathing: with supervision;sitting;standing Pt Will Perform Lower Body Bathing: sit to/from stand;with min guard assist Pt Will Perform Upper Body Dressing: with supervision;sitting;standing Pt Will Perform Lower Body Dressing: sit to/from stand;with min guard assist Pt Will Transfer to Toilet: with supervision;ambulating;regular height toilet;bedside commode;grab bars Pt Will Perform Toileting - Clothing Manipulation and hygiene: sit to/from stand;with min guard assist Pt/caregiver will Perform Home Exercise Program: Increased strength;Increased ROM;Right Upper extremity;With written HEP provided Additional ADL Goal #1: Pt will be able to locate items asked of him without VCs Additional ADL Goal #2: Pt will be min guard A in and OOB for basic ADLs  Plan Discharge plan updated       AM-PAC OT "6 Clicks" Daily Activity     Outcome Measure   Help from another person eating meals?: None Help from another person taking care of personal grooming?: A Little Help from another person toileting, which includes using toliet, bedpan, or urinal?: A Little Help from another person bathing (including washing, rinsing, drying)?: A Little Help from another person to put on and taking off regular upper body clothing?: A Little Help from another person to put on and taking off regular lower body clothing?: A Little 6 Click Score: 19    End of Session Equipment Utilized During Treatment: [id number])  OT Visit Diagnosis: Unsteadiness on feet (R26.81);Other abnormalities of gait and mobility (R26.89);Muscle weakness (generalized) (M62.81);Low vision, both eyes (H54.2);Other symptoms and signs involving cognitive function;Hemiplegia and hemiparesis Hemiplegia - Right/Left: Right Hemiplegia - dominant/non-dominant: Dominant Hemiplegia - caused by: Unspecified   Activity Tolerance Patient tolerated treatment  well   Patient Left in chair;with call bell/phone within reach   Nurse Communication Mobility status        Time: [id number] OT Time Calculation (min): 13 min  Charges: OT General Charges $OT Visit: 1 Visit OT Treatments $Self Care/Home Management : 8-22 mins  Shon Millet OT OT office: [place] 08/02/2019, 11:25 AM

## 2019-08-02 NOTE — Progress Notes (Signed)
Physical Therapy Treatment Patient Details Name: BENSEN CHADDERDON MRN: [id number] DOB: August 15, 1940 Today's Date: 08/02/2019    History of Present Illness Tymon R Coury is a 78 y.o. male who presented to ED 12/1 with abd pain and AMS.  Had been seen 1 day earlier and CT abd / pelv showed cholelithiasis without cholecystitis. Intubated 12/4-12/7. PHMx: Thoracic aortic aneurysm; Chronic low back pain; Choledocholithiasis; Chronic pain; Acute metabolic encephalopathy; ACS. 12/3 CT head: Significant motion limitations. No evidence of acute abnormality    PT Comments    Patient received in chair, cognition continues to improve although he was in general restless and in an impatient mood due to waiting to transfer to SNF today; daughter present and observed session/assisted in encouraging patient to participate. Able to complete functional transfers and gait approximately 6f with min guard and RW although continues to display intermittent impulsivity and requires Mod-max cues for safety and correct distance from RW. When back in his room, he pushed RW to the side and put both hands on the arms of the chair, putting himself in a modified quadruped position and required Min guard/Max cues to maintain safety while turning so the chair was behind him. He was left up in the chair with all needs met and daughter providing direct supervision, aware to let staff know when she leaves so chair alarm can be reactivated.     Follow Up Recommendations  SNF;Supervision/Assistance - 24 hour     Equipment Recommendations  Other (comment)(defer)    Recommendations for Other Services       Precautions / Restrictions Precautions Precautions: Fall Precaution Comments: HOH Restrictions Weight Bearing Restrictions: No    Mobility  Bed Mobility               General bed mobility comments: seated in recliner upon arrival  Transfers Overall transfer level: Needs assistance Equipment used: 4-wheeled  walker Transfers: Sit to/from Stand Sit to Stand: Supervision         General transfer comment: S for safety, VC and TC for safety and intermittent min guard as patient let go of 4WW and went into modified quadruped position at cAlexanderupon return to room. Cannot remember he is attached to urinary catheter  Ambulation/Gait Ambulation/Gait assistance: Min guard Gait Distance (Feet): 90 Feet Assistive device: Rolling walker (2 wheeled) Gait Pattern/deviations: Step-through pattern;Decreased stride length;Trunk flexed;Drifts right/left Gait velocity: decreased   General Gait Details: min guard for safety, Max VC for safe distance from and safe use of 4WW, poor carryover of cues, continues to lean on 4WW with forearms and resistant to TC to correct   Stairs             Wheelchair Mobility    Modified Rankin (Stroke Patients Only)       Balance Overall balance assessment: Needs assistance Sitting-balance support: No upper extremity supported;Feet supported Sitting balance-Leahy Scale: Good     Standing balance support: Bilateral upper extremity supported;During functional activity Standing balance-Leahy Scale: Poor Standing balance comment: reliant on external support                            Cognition Arousal/Alertness: Awake/alert Behavior During Therapy: WFL for tasks assessed/performed;Impulsive Overall Cognitive Status: Impaired/Different from baseline Area of Impairment: Attention;Memory;Following commands;Safety/judgement                 Orientation Level: Disoriented to;Situation;Time Current Attention Level: Sustained Memory: Decreased short-term memory Following Commands: Follows one step  commands with increased time;Follows one step commands consistently;Follows multi-step commands inconsistently Safety/Judgement: Decreased awareness of safety Awareness: Emergent Problem Solving: Requires verbal cues General Comments: continues with  intermittent impulsivity although overall cognition is improving. Able to stay on task and is aware that he is going to SNF today. Continues to have poor safety awareness      Exercises      General Comments        Pertinent Vitals/Pain Pain Assessment: Faces Faces Pain Scale: Hurts a little bit Pain Location: back Pain Descriptors / Indicators: Aching;Discomfort Pain Intervention(s): Limited activity within patient's tolerance;Monitored during session    Home Living                      Prior Function            PT Goals (current goals can now be found in the care plan section) Acute Rehab PT Goals Patient Stated Goal: to go to rehab PT Goal Formulation: With patient Time For Goal Achievement: 08/03/19 Potential to Achieve Goals: Good Progress towards PT goals: Progressing toward goals    Frequency    Min 3X/week      PT Plan Discharge plan needs to be updated    Co-evaluation              AM-PAC PT "6 Clicks" Mobility   Outcome Measure  Help needed turning from your back to your side while in a flat bed without using bedrails?: A Little Help needed moving from lying on your back to sitting on the side of a flat bed without using bedrails?: A Little Help needed moving to and from a bed to a chair (including a wheelchair)?: A Little Help needed standing up from a chair using your arms (e.g., wheelchair or bedside chair)?: A Little Help needed to walk in hospital room?: A Little Help needed climbing 3-5 steps with a railing? : A Lot 6 Click Score: 17    End of Session   Activity Tolerance: Patient tolerated treatment well Patient left: in chair;with call bell/phone within reach;with family/visitor present(family member providing direct supervision, aware to let staff know when she leaves so  chair/bed alarm can be put back on)   PT Visit Diagnosis: Other abnormalities of gait and mobility (R26.89);Muscle weakness (generalized)  (M62.81);Difficulty in walking, not elsewhere classified (R26.2)     Time: 1062-6948 PT Time Calculation (min) (ACUTE ONLY): 17 min  Charges:  $Gait Training: 8-22 mins                     Windell Norfolk, DPT, PN1   Supplemental Physical Therapist [place]    Pager [phone] Acute Rehab Office [phone]

## 2019-08-02 NOTE — TOC Transition Note (Signed)
Transition of Care [facility]) - CM/SW Discharge Note   Patient Details  Name: Riley Eaton MRN: [id number] Date of Birth: 07/21/1941  Transition of Care [facility]) CM/SW Contact:  Eileen Stanford, LCSW Phone Number: 08/02/2019, 10:21 AM   Clinical Narrative:  Clinical Social Worker facilitated patient discharge including contacting patient family and facility to confirm patient discharge plans.  Clinical information faxed to facility and family agreeable with plan.  CSW arranged ambulance transport via [facility] (2:30) to [facility].  RN to call [phone] for report prior to discharge.     Final next level of care: Skilled Nursing Facility Barriers to Discharge: No Barriers Identified   Patient Goals and CMS Choice Patient states their goals for this hospitalization and ongoing recovery are:: "to get him stronger"-daughter   Choice offered to / list presented to : Adult Children  Discharge Placement              Patient chooses bed at: [facility] Patient to be transferred to facility by: [facility] Name of family member notified: [name] Patient and family notified of of transfer: 08/02/19  Discharge Plan and Services In-house Referral: NA Discharge Planning Services: NA Post Acute Care Choice: [place]                               Social Determinants of Health (SDOH) Interventions     Readmission Risk Interventions No flowsheet data found.

## 2019-08-17 NOTE — Progress Notes (Signed)
Virtual Visit via Telephone Note   This visit type was conducted due to national recommendations for restrictions regarding the COVID-19 Pandemic (e.g. social distancing) in an effort to limit this patient's exposure and mitigate transmission in our community.  Due to his co-morbid illnesses, this patient is at least at moderate risk for complications without adequate follow up.  This format is felt to be most appropriate for this patient at this time.  The patient did not have access to video technology/had technical difficulties with video requiring transitioning to audio format only (telephone).  All issues noted in this document were discussed and addressed.  No physical exam could be performed with this format.  Please refer to the patient's chart for his  consent to telehealth for [facility].  Evaluation Performed:  Follow-up visit  This visit type was conducted due to national recommendations for restrictions regarding the COVID-19 Pandemic (e.g. social distancing).  This format is felt to be most appropriate for this patient at this time.  All issues noted in this document were discussed and addressed.  No physical exam was performed (except for noted visual exam findings with Video Visits).  Please refer to the patient's chart (MyChart message for video visits and phone note for telephone visits) for the patient's consent to telehealth for [facility]  Date:  08/18/2019   ID:  Riley Eaton, DOB March 15, 1941, MRN [id number]  Patient Location:  [place] [id number]   Provider location:      [address] Office [phone] Fax [phone]   PCP:  Aletha Halim., PA-C  Cardiologist:  Quay Burow, MD  Electrophysiologist:  None   Chief Complaint: Follow-up visit  History of Present Illness:    Riley Eaton is a 79 y.o. male who presents via audio/video conferencing for a  telehealth visit today.  Patient verified DOB and address.  The patient does not symptoms concerning for COVID-19 infection (fever, chills, cough, or new SHORTNESS OF BREATH).   Riley Eaton has a past medical history of thoracic aortic aneurysm, coronary artery disease, chronic back pain, chronic opioid use, dilated cardiac myopathy, AMS, pancreatic stent placement, and non-STEMI.  She was seen in hospital consultation on 07/13/2019 for elevated cardiac enzymes in the setting of altered mental status and cholelithiasis.  Consult was requested by Dr. Benny Lennert.  Her echocardiogram from 07/14/2019 showed an LVEF of 30 to 35%, LV grossly severely decreased function, mild LVH, and grade 1 diastolic dysfunction.  Troponin peak 2110.  He was not a candidate for cath due to AMS and acute renal failure. He was started on losartan and spironolactone however, medications were DC'd due to his acute renal failure 07/27/2019. BUN 51 and Creat 3.77 at DC on 08/02/19 and trending down.   He is seen virtually today for follow-up and states he has had some nauseousness since returning home from his rehab facility.  He also has chronic back pain.  He has been busy with activities of daily living but has not resumed his rehab exercises due to his nausea.  He plans to resume his rehab exercises as soon as he is able.  He states overall he feels well and has only noticed mild indigestion that is relieved with belching and ambulation.  He has an appointment with his PCP on August 25, 2018 and will have a BMP drawn there.  He states he has a urinary catheter that will be removed  by nephrology during his January 18 appointment.  I will also order a coronary CTA which has been reviewed with Dr. Gwenlyn Found.  He will follow up with Dr. Gwenlyn Found after his CTA.  He denies chest pain, shortness of breath, lower extremity edema, fatigue, palpitations, melena, hematuria, hemoptysis, diaphoresis, weakness, presyncope, syncope, orthopnea, and PND.     Prior CV studies:   The following studies were reviewed today:  EKG 07/24/2019 Sinus rhythm with PVCs ST and T wave abnormalities consider lateral ischemia, QT prolongation 100 bpm  Echocardiogram 07/14/2019 IMPRESSIONS    1. Technically difficult study. Left ventricular ejection fraction, by visual estimation, is 30 to 35%. The left ventricle has grossly severely decreased function. Images are not sufficient to assess for regional wall motion abnormalities. There is  mildly increased left ventricular hypertrophy.  2. Left ventricular diastolic parameters are consistent with Grade I diastolic dysfunction (impaired relaxation).  3. Global right ventricle has normal systolic function.The right ventricular size is normal.  4. The mitral valve is normal in structure. No evidence of mitral valve regurgitation.  5. The tricuspid valve is not well visualized. Tricuspid valve regurgitation is not demonstrated.  6. The aortic valve was not well visualized. Aortic valve regurgitation is not visualized.  7. The pulmonic valve was not well visualized. Pulmonic valve regurgitation is not visualized.  Nuclear stress stress 10/20/2015  The left ventricular ejection fraction is normal (55-65%).  Nuclear stress EF: 57%.  There was no ST segment deviation noted during stress.  The study is normal.  This is a low risk study.   Normal pharmacologic nuclear stress test with no prior infarct and no ischemia.  Past Medical History:  Diagnosis Date  . Chronic back pain   . 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 Chronic, continuous use of opioids    for back pain  . Coronary artery calcification seen on CT scan   . Nerve pain   . Thoracic aortic aneurysm [facility])    Past Surgical History:  Procedure Laterality Date  . BACK SURGERY     09-2016  . back surgey    . ENDOSCOPIC RETROGRADE CHOLANGIOPANCREATOGRAPHY (ERCP) WITH PROPOFOL N/A 09/25/2018   Procedure: ENDOSCOPIC RETROGRADE  CHOLANGIOPANCREATOGRAPHY (ERCP) WITH PROPOFOL;  Surgeon: Ronnette Juniper, MD;  Location: [place];  Service: Gastroenterology;  Laterality: N/A;  . PANCREATIC STENT PLACEMENT  09/25/2018   Procedure: PANCREATIC STENT PLACEMENT;  Surgeon: Ronnette Juniper, MD;  Location: [facility] ENDOSCOPY;  Service: Gastroenterology;;  . REMOVAL OF STONES  09/25/2018   Procedure: REMOVAL OF STONES;  Surgeon: Ronnette Juniper, MD;  Location: [place];  Service: Gastroenterology;;  . [name]  09/25/2018   Procedure: [name];  Surgeon: Ronnette Juniper, MD;  Location: [place];  Service: Gastroenterology;;     Current Meds  Medication Sig  . allopurinol (ZYLOPRIM) 100 MG tablet TAKE 1 TABLET BY MOUTH EVERY DAY (Patient taking differently: Take 100 mg by mouth daily as needed (gout flare up). )  . atorvastatin (LIPITOR) 40 MG tablet Take 1 tablet (40 mg total) by mouth daily at 6 PM.  . carvedilol (COREG) 12.5 MG tablet Take 1 tablet (12.5 mg total) by mouth 2 (two) times daily with a meal.  . cloNIDine (CATAPRES - DOSED IN MG/24 HR) 0.1 mg/24hr patch Place 1 patch (0.1 mg total) onto the skin once a week.  . finasteride (PROSCAR) 5 MG tablet Take 1 tablet (5 mg total) by mouth daily. For urination  . OLANZapine (ZYPREXA) 5 MG tablet Take 1 tablet (5  mg total) by mouth daily.  . pantoprazole (PROTONIX) 40 MG tablet Take 1 tablet (40 mg total) by mouth at bedtime.  . polyethylene glycol (MIRALAX / GLYCOLAX) 17 g packet Take 17 g by mouth daily.  . tamsulosin (FLOMAX) 0.4 MG CAPS capsule Take 1 capsule (0.4 mg total) by mouth daily.  . traZODone (DESYREL) 50 MG tablet Take 50 mg by mouth at bedtime.     Allergies:   Patient has no known allergies.   Social History   Tobacco Use  . Smoking status: Never Smoker  . Smokeless tobacco: Never Used  Substance Use Topics  . Alcohol use: No  . Drug use: No     Family Hx: The patient's family history includes Cancer in his mother; Heart attack in his father.   ROS:   Please see the history of present illness.     All other systems reviewed and are negative.   Labs/Other Tests and Data Reviewed:    Recent Labs: 07/16/2019: TSH 0.389 07/24/2019: ALT 24 07/27/2019: Magnesium 2.2 07/28/2019: Hemoglobin 9.7; Platelets 142 08/02/2019: BUN 51; Creatinine, Ser 3.77; Potassium 4.4; Sodium 138   Recent Lipid Panel Lab Results  Component Value Date/Time   TRIG 104 07/22/2019 03:48 AM    Wt Readings from Last 3 Encounters:  08/18/19 152 lb 9.6 oz (69.2 kg)  08/02/19 164 lb 8 oz (74.6 kg)  02/01/19 162 lb (73.5 kg)     Exam:    Vital Signs:  BP (!) 131/59   Pulse 60   Ht 5\' 7"  (1.702 m)   Wt 152 lb 9.6 oz (69.2 kg)   BMI 23.90 kg/m    Well nourished, well developed male in no  acute distress.   ASSESSMENT & PLAN:    1.  NSTEMI-07/16/2019 EKG showed deep T wave inversion in inferior and anterolateral leads.  07/24/2019 EKG showed sinus rhythm with PVCs ST and T wave abnormalities consider lateral ischemia, QT prolongation 100 bpm.  At bedtime troponin peaked at 2110 (07/15/2019).  Echocardiogram 07/14/2019 showed an LVEF of 30-35% with mild LV H and grade 1 diastolic dysfunction.  Unable to undergo cath during 12/20 hospital admission due to AMS, sepsis, and AKI. Order coronary CTA   Dilated cardiomyopathy-Echocardiogram 07/14/2019 showed an LVEF of 30-35% with mild LV H and grade 1 diastolic dysfunction.  Images were not sufficient to assess for wall motion abnormalities.  07/27/2019 losartan and spironolactone DC'd due to acute kidney injury. Repeat BMP-to be drawn at [facility] office.  (Request resolved) 08/25/2018 appointment Recommend restarting losartan versus Entresto and spironolactone based on BMP results. Repeat echocardiogram once medication optimized.  Acute kidney injury-creatinine 07/24/2019 0.74, BUN 15.  On 07/30/2019 creatinine peaked at 4.36, BUN 57.  At discharge 08/02/2019 creatinine 3.77, BUN 51. Order BMP  Monitored by nephrology.  08/29/2018 appointment  Disposition: Follow-up with Dr. Gwenlyn Found after coronary CTA.  COVID-19 Education: The signs and symptoms of COVID-19 were discussed with the patient and how to seek care for testing (follow up with PCP or arrange E-visit).  The importance of social distancing was discussed today.  Patient Risk:   After full review of this patients clinical status, I feel that they are at least moderate risk at this time.  Time:   Today, I have spent18 minutes with the patient with telehealth technology discussing hospital course, diet, exercise, medications, CTA.     Medication Adjustments/Labs and Tests Ordered: Current medicines are reviewed at length with the patient today.  Concerns regarding medicines  are outlined above.   Tests Ordered: Orders Placed This Encounter  Procedures  . CT CORONARY MORPH W/CTA COR W/SCORE W/CA W/CM &/OR WO/CM  . CT CORONARY FRACTIONAL FLOW RESERVE DATA PREP  . CT CORONARY FRACTIONAL FLOW RESERVE FLUID ANALYSIS  . BMET   Medication Changes: No orders of the defined types were placed in this encounter.   Disposition:  in 1 month(s)  Signed,  Jossie Ng. [facility] [place] [address] Office [phone] Fax [phone]

## 2019-08-18 ENCOUNTER — Telehealth (INDEPENDENT_AMBULATORY_CARE_PROVIDER_SITE_OTHER): Payer: Medicare PPO | Admitting: General Practice

## 2019-08-18 VITALS — BP 131/59 | HR 60 | Ht 67.0 in | Wt 152.6 lb

## 2019-08-18 DIAGNOSIS — I420 Dilated cardiomyopathy: Secondary | ICD-10-CM | POA: Diagnosis not present

## 2019-08-18 DIAGNOSIS — Z79899 Other long term (current) drug therapy: Secondary | ICD-10-CM

## 2019-08-18 DIAGNOSIS — R9439 Abnormal result of other cardiovascular function study: Secondary | ICD-10-CM

## 2019-08-18 DIAGNOSIS — Z8679 Personal history of other diseases of the circulatory system: Secondary | ICD-10-CM | POA: Diagnosis not present

## 2019-08-18 DIAGNOSIS — R778 Other specified abnormalities of plasma proteins: Secondary | ICD-10-CM

## 2019-08-18 DIAGNOSIS — R931 Abnormal findings on diagnostic imaging of heart and coronary circulation: Secondary | ICD-10-CM

## 2019-08-18 MED ORDER — METOPROLOL SUCCINATE ER 50 MG PO TB24: 50.0000 mg | ORAL_TABLET | Freq: Once | ORAL | 0 refills | Status: DC

## 2019-08-18 MED ORDER — METOPROLOL TARTRATE 50 MG PO TABS: 50.0000 mg | ORAL_TABLET | Freq: Once | ORAL | 0 refills | Status: DC

## 2019-08-18 NOTE — Patient Instructions (Addendum)
Follow-Up:  AFTER CTA   In Person Quay Burow, MD.    LABS: BMET-THIS IS NOT FASTING, HAVE THIS DONE AT YOUR APPOINTMENT WITH DR Deatra Ina 08-26-19  Reduce your risk of getting COVID-19 With your heart disease it is especially important for people at increased risk of severe illness from COVID-19, and those who live with them, to protect themselves from getting COVID-19. The best way to protect yourself and to help reduce the spread of the virus that causes COVID-19 is to: [name] Limit your interactions with other people as much as possible. . Take precautions to prevent getting COVID-19 when you do interact with others. If you start feeling sick and think you may have COVID-19, get in touch with your healthcare provider within 24  At [facility], you and your health needs are our priority.  As part of our continuing mission to provide you with exceptional heart care, we have created designated Provider Care Teams.  These Care Teams include your primary Cardiologist (physician) and Advanced Practice Providers (APPs -  Physician Assistants and Nurse Practitioners) who all work together to provide you with the care you need, when you need it.  Thank you for choosing [facility] at [facility]!!  Special Instructions: Your cardiac CT will be scheduled at one of the below locations:   [facility] [address] [place], [place] [address] (336) [place] [address] [place], [place] [address] [phone]  If scheduled at [facility], please arrive at the [facility] main entrance of [facility] 30-45 minutes prior to test start time. Proceed to the [facility] Radiology Department (first floor) to check-in and test prep.  If scheduled at [facility], please arrive 15 mins early for check-in and test prep.  Please follow these instructions carefully (unless otherwise  directed):  Hold all erectile dysfunction medications at least 3 days (72 hrs) prior to test.  On the Night Before the Test: . Be sure to Drink plenty of water. . Do not consume any caffeinated/decaffeinated beverages or chocolate 12 hours prior to your test. . Do not take any antihistamines 12 hours prior to your test.  On the Day of the Test: . Drink plenty of water. Do not drink any water within one hour of the test. . Do not eat any food 4 hours prior to the test. . You may take your regular medications prior to the test.  . Take metoprolol (Lopressor) 50MG  two hours prior to test.      After the Test: . Drink plenty of water. . After receiving IV contrast, you may experience a mild flushed feeling. This is normal. . On occasion, you may experience a mild rash up to 24 hours after the test. This is not dangerous. If this occurs, you can take Benadryl 25 mg and increase your fluid intake. . If you experience trouble breathing, this can be serious. If it is severe call 911 IMMEDIATELY. If it is mild, please call our office. . If you take any of these medications: Glipizide/Metformin, Avandament, Glucavance, please do not take 48 hours after completing test unless otherwise instructed.  Once we have confirmed authorization from your insurance company, we will call you to set up a date and time for your test.   For non-scheduling related questions, please contact the cardiac imaging nurse navigator should you have any questions/concerns: Marchia Bond, RN Navigator Cardiac Imaging [facility] [phone] Office

## 2019-08-27 ENCOUNTER — Telehealth: Payer: Self-pay

## 2019-08-27 NOTE — Telephone Encounter (Signed)
New Message    Pt c/o Shortness Of Breath: STAT if SOB developed within the last 24 hours or pt is noticeably SOB on the phone  1. Are you currently SOB (can you hear that pt is SOB on the phone)?  Yes   2. How long have you been experiencing SOB? Yes and chest pain   3. Are you SOB when sitting or when up moving around? Both   4. Are you currently experiencing any other symptoms?  ? Patient was yelling at me because his cardiac ct has not been authorized, if someone doesn't call him right away he is going to go the ER at 12:01am and have Dr Gwenlyn Found pulled out of bed to treat him.  Dr Gwenlyn Found told him to call him personally if he had any type of sob and chest pains.

## 2019-08-27 NOTE — Telephone Encounter (Signed)
Spoke to patient he stated he has been having sob,chest pain.Stated he was told by PCP to see Dr.Berry.He had a telephone visit 09/02/19.Stated he wants appointment with Dr.Berry.Appointment scheduled with Dr.Berry 08/31/19 at 8:45 am.

## 2019-08-31 ENCOUNTER — Ambulatory Visit: Payer: Medicare PPO | Admitting: Cardiovascular Disease

## 2019-08-31 ENCOUNTER — Encounter: Payer: Self-pay | Admitting: Cardiovascular Disease

## 2019-08-31 ENCOUNTER — Encounter (INDEPENDENT_AMBULATORY_CARE_PROVIDER_SITE_OTHER): Payer: Self-pay

## 2019-08-31 ENCOUNTER — Other Ambulatory Visit: Payer: Self-pay

## 2019-08-31 VITALS — BP 118/69 | HR 74 | Temp 96.4°F | Ht 67.0 in | Wt 164.0 lb

## 2019-08-31 DIAGNOSIS — R0989 Other specified symptoms and signs involving the circulatory and respiratory systems: Secondary | ICD-10-CM

## 2019-08-31 DIAGNOSIS — I2510 Atherosclerotic heart disease of native coronary artery without angina pectoris: Secondary | ICD-10-CM

## 2019-08-31 DIAGNOSIS — I420 Dilated cardiomyopathy: Secondary | ICD-10-CM

## 2019-08-31 DIAGNOSIS — E785 Hyperlipidemia, unspecified: Secondary | ICD-10-CM | POA: Insufficient documentation

## 2019-08-31 DIAGNOSIS — Z79899 Other long term (current) drug therapy: Secondary | ICD-10-CM | POA: Diagnosis not present

## 2019-08-31 DIAGNOSIS — E782 Mixed hyperlipidemia: Secondary | ICD-10-CM

## 2019-08-31 DIAGNOSIS — R931 Abnormal findings on diagnostic imaging of heart and coronary circulation: Secondary | ICD-10-CM

## 2019-08-31 NOTE — Assessment & Plan Note (Signed)
History of thoracic aortic aneurysm by CT measured 4.1 cm approximate 3 to 4 years ago however recent echo performed last month did not comment on this and suggested normal aortic dimensions.  In any event, he is not a candidate for thoracic aortic repair

## 2019-08-31 NOTE — Assessment & Plan Note (Signed)
Coronary artery calcification seen on CT scan in the past.  He was recently hospitalized early December last year for with altered mental status and had troponin leak up 2100 with EKG changes.  Because of significantly elevated creatinine and altered mental status cardiac catheterization was not performed pursued.  We talked about doing a coronary CTA but that it too requires contrast and given his renal function he is not a candidate for this.

## 2019-08-31 NOTE — Assessment & Plan Note (Signed)
EF was 30% visually by recent 2D echo performed 07/14/2019.  Of note, he did have a Myoview stress test performed 10/20/2015 which mentioned a ejection fraction of 60%.  He is not a candidate for ACE, or ARB given his renal dysfunction.  He is on carvedilol.  He has chronic shortness of breath which has had since I saw him almost 4 years ago.

## 2019-08-31 NOTE — Progress Notes (Signed)
Riley Eaton patient's so antibiotic her bunion need to step up the pace 12-12 30     08/31/2019 Riley Eaton   31-May-1941  [id number]  Primary Physician Aletha Halim., PA-C Primary Cardiologist: Lorretta Harp MD FACP, [place], [place], [place]  HPI:  Riley Eaton is a 79 y.o.  mildly overweight married Caucasian male who is accompanied by his daughter Riley Eaton today.  He is the father of 34, grandfather of 4 grandchildren. Retired from being in the [employer]. He is referred by Riley Reader PA-C at [facility] for cardiovascular evaluation because of an episode of shortness of breath, and an incidentally noted thoracic aortic aneurysm. I last saw him in the office 12/06/2016. He basically has no cardiac risk factors. He's never had a heart attack or stroke. He denies chest pain or shortness of breath. A Myoview stress test was performed 10/20/15 which was entirely normal. A CT scan revealed a thoracic aortic aneurysm measuring 4.1 cm.  Since I saw him in the office almost 3 years ago he was admitted in early December for altered mental status.  He did have a troponin of 2100 and EKG changes.  2D echo revealed EF of 30% which is new compared to the EF on Myoview stress test performed 10/20/2015 which was normal.  He was not a candidate for cardiac catheterization because of severe renal insufficiency nor he is a candidate for coronary CTA because of this as well.  He gets occasional although infrequent chest pain.  2D echo did not comment on thoracic aortic aneurysm.   No outpatient medications have been marked as taking for the 08/31/19 encounter (Office Visit) with Lorretta Harp, MD.     No Known Allergies  Social History   Socioeconomic History  . Marital status: Married    Spouse name: Not on file  . Number of children: 3  . Years of education: 50  . Highest education level: Not on file  Occupational History  . Not on file  Tobacco Use  . Smoking status: Never Smoker  .  Smokeless tobacco: Never Used  Substance and Sexual Activity  . Alcohol use: No  . Drug use: No  . Sexual activity: Not on file  Other Topics Concern  . Not on file  Social History Narrative   Lives with wife   Caffeine use: Drinks 6 cups coffee per day   Right handed    Social Determinants of Health   Financial Resource Strain:   . Difficulty of Paying Living Expenses: Not on file  Food Insecurity:   . Worried About Charity fundraiser in the Last Year: Not on file  . Ran Out of Food in the Last Year: Not on file  Transportation Needs:   . Lack of Transportation (Medical): Not on file  . Lack of Transportation (Non-Medical): Not on file  Physical Activity:   . Days of Exercise per Week: Not on file  . Minutes of Exercise per Session: Not on file  Stress:   . Feeling of Stress : Not on file  Social Connections:   . Frequency of Communication with Friends and Family: Not on file  . Frequency of Social Gatherings with Friends and Family: Not on file  . Attends Religious Services: Not on file  . Active Member of Clubs or Organizations: Not on file  . Attends Archivist Meetings: Not on file  . Marital Status: Not on file  Intimate Partner Violence:   . Fear of  Current or Ex-Partner: Not on file  . Emotionally Abused: Not on file  . Physically Abused: Not on file  . Sexually Abused: Not on file     Review of Systems: General: negative for chills, fever, night sweats or weight changes.  Cardiovascular: negative for chest pain, dyspnea on exertion, edema, orthopnea, palpitations, paroxysmal nocturnal dyspnea or shortness of breath Dermatological: negative for rash Respiratory: negative for cough or wheezing Urologic: negative for hematuria Abdominal: negative for nausea, vomiting, diarrhea, bright red blood per rectum, melena, or hematemesis Neurologic: negative for visual changes, syncope, or dizziness All other systems reviewed and are otherwise negative except  as noted above.    Blood pressure 118/69, pulse 74, temperature (!) 96.4 F (35.8 C), height 5\' 7"  (1.702 m), weight 164 lb (74.4 kg), SpO2 100 %.  General appearance: alert and no distress Neck: no adenopathy, no JVD, supple, symmetrical, trachea midline, thyroid not enlarged, symmetric, no tenderness/mass/nodules and Soft right carotid bruit Lungs: clear to auscultation bilaterally Heart: regular rate and rhythm, S1, S2 normal, no murmur, click, rub or gallop Extremities: extremities normal, atraumatic, no cyanosis or edema Pulses: 2+ and symmetric Skin: Skin color, texture, turgor normal. No rashes or lesions Neurologic: Alert and oriented X 3, normal strength and tone. Normal symmetric reflexes. Normal coordination and gait  EKG not performed today  ASSESSMENT AND PLAN:   Thoracic aortic aneurysm (HCC) History of thoracic aortic aneurysm by CT measured 4.1 cm approximate 3 to 4 years ago however recent echo performed last month did not comment on this and suggested normal aortic dimensions.  In any event, he is not a candidate for thoracic aortic repair  Coronary artery calcification seen on CT scan Coronary artery calcification seen on CT scan in the past.  He was recently hospitalized early December last year for with altered mental status and had troponin leak up 2100 with EKG changes.  Because of significantly elevated creatinine and altered mental status cardiac catheterization was not performed pursued.  We talked about doing a coronary CTA but that it too requires contrast and given his renal function he is not a candidate for this.  DCM (dilated cardiomyopathy) ([place]) EF was 30% visually by recent 2D echo performed 07/14/2019.  Of note, he did have a Myoview stress test performed 10/20/2015 which mentioned a ejection fraction of 60%.  He is not a candidate for ACE, or ARB given his renal dysfunction.  He is on carvedilol.  He has chronic shortness of breath which has had since I  saw him almost 4 years ago.  Hyperlipidemia History of hyperlipidemia on statin therapy.  We will recheck a lipid liver profile.      Lorretta Harp MD FACP,FACC,FAHA, [facility] 08/31/2019 9:07 AM

## 2019-08-31 NOTE — Assessment & Plan Note (Signed)
History of hyperlipidemia on statin therapy.  We will recheck a lipid liver profile 

## 2019-08-31 NOTE — Patient Instructions (Signed)
Medication Instructions:  Your physician recommends that you continue on your current medications as directed. Please refer to the Current Medication list given to you today.  If you need a refill on your cardiac medications before your next appointment, please call your pharmacy.   Lab work: Fasting Lipid and Hepatic Function If you have labs (blood work) drawn today and your tests are completely normal, you will receive your results only by: MyChart Message (if you have MyChart) OR A paper copy in the mail If you have any lab test that is abnormal or we need to change your treatment, we will call you to review the results.  Testing/Procedures: Your physician has requested that you have a carotid duplex. This test is an ultrasound of the carotid arteries in your neck. It looks at blood flow through these arteries that supply the brain with blood. Allow one hour for this exam. There are no restrictions or special instructions.  Follow-Up: At [facility], you and your health needs are our priority.  As part of our continuing mission to provide you with exceptional heart care, we have created designated Provider Care Teams.  These Care Teams include your primary Cardiologist (physician) and Advanced Practice Providers (APPs -  Physician Assistants and Nurse Practitioners) who all work together to provide you with the care you need, when you need it. You may see Quay Burow, MD or one of the following Advanced Practice Providers on your designated Care Team:    Kerin Ransom, PA-C  [place], [place]  Coletta Memos, [place]  Your physician wants you to follow-up in: 3 months with Coletta Memos, FNP.  Your physician wants you to follow-up in: 1 year with Dr. Gwenlyn Found

## 2019-09-03 ENCOUNTER — Other Ambulatory Visit: Payer: Self-pay

## 2019-09-03 ENCOUNTER — Ambulatory Visit (HOSPITAL_COMMUNITY)
Admission: RE | Admit: 2019-09-03 | Discharge: 2019-09-03 | Disposition: A | Discharge status: Home / Self Care | Payer: Medicare PPO | Source: Ambulatory Visit | Attending: Cardiology | Admitting: Cardiology

## 2019-09-03 DIAGNOSIS — R0989 Other specified symptoms and signs involving the circulatory and respiratory systems: Secondary | ICD-10-CM | POA: Insufficient documentation

## 2019-09-03 LAB — HEPATIC FUNCTION PANEL
ALT: 13 IU/L (ref 0–44)
AST: 18 IU/L (ref 0–40)
Albumin: 4.2 g/dL (ref 3.7–4.7)
Alkaline Phosphatase: 117 IU/L (ref 39–117)
Bilirubin Total: 0.5 mg/dL (ref 0.0–1.2)
Bilirubin, Direct: 0.17 mg/dL (ref 0.00–0.40)
Total Protein: 6.4 g/dL (ref 6.0–8.5)

## 2019-09-03 LAB — LIPID PANEL
Chol/HDL Ratio: 1.8 ratio (ref 0.0–5.0)
Cholesterol, Total: 103 mg/dL (ref 100–199)
HDL: 58 mg/dL (ref >39)
LDL Chol Calc (NIH): 32 mg/dL (ref 0–99)
Triglycerides: 57 mg/dL (ref 0–149)
VLDL Cholesterol Cal: 13 mg/dL (ref 5–40)

## 2019-09-28 ENCOUNTER — Telehealth: Payer: Self-pay | Admitting: Cardiovascular Disease

## 2019-09-28 NOTE — Telephone Encounter (Signed)
New Message     Pt c/o swelling: STAT is pt has developed SOB within 24 hours  1) How much weight have you gained and in what time span? 6 pounds in a week   2) If swelling, where is the swelling located? No Swelling   3) Are you currently taking a fluid pill? No   4) Are you currently SOB? Sob only when moving around, Nichole states the pts oxygen level went down to 80% when having him move around and after resting, he gained the oxygen back   5) Do you have a log of your daily weights (if so, list)? Yes   6) Have you gained 3 pounds in a day or 5 pounds in a week? 6 pounds in a week  Feb 5th 163  Feb 9th 163  Feb 12th 166  Feb 16th 170   7) Have you traveled recently? No    Please call

## 2019-09-28 NOTE — Telephone Encounter (Signed)
Spoke with Elmyra Ricks from [facility] who report today she noticed pt experiencing SOB with exertion. She report this is new as last week pt was fine. She state she walked pt around the house twice and O2 dropped to 88% but quickly increased with rest. Pt weight listed below.  2/5- 163 2/9- 163 2/12- 166 2/16- 170  Appointment scheduled for further evaluations for tomorrow 2/16. Pt agreeable to plan.

## 2019-09-29 ENCOUNTER — Other Ambulatory Visit: Payer: Self-pay

## 2019-09-29 ENCOUNTER — Encounter: Payer: Self-pay | Admitting: Cardiovascular Disease

## 2019-09-29 ENCOUNTER — Ambulatory Visit: Payer: Medicare PPO | Admitting: Cardiovascular Disease

## 2019-09-29 VITALS — BP 148/75 | HR 61 | Ht 67.0 in | Wt 173.6 lb

## 2019-09-29 DIAGNOSIS — I420 Dilated cardiomyopathy: Secondary | ICD-10-CM

## 2019-09-29 DIAGNOSIS — Z79899 Other long term (current) drug therapy: Secondary | ICD-10-CM | POA: Diagnosis not present

## 2019-09-29 MED ORDER — FUROSEMIDE 40 MG PO TABS: 40.0000 mg | ORAL_TABLET | Freq: Every day | ORAL | 3 refills | Status: DC

## 2019-09-29 NOTE — Assessment & Plan Note (Signed)
Severe LV dysfunction with an EF in the 30% range.  When I saw him back a month ago he was not complaining of dyspnea on exertion or orthopnea although he always is somewhat short of breath.  His creatinine is in the mid 4 range.  He is not a candidate for coronary CTA because of this.  He does not appear to be volume overloaded on exam.  His lungs are clear.  Jugular venous pressure is mildly elevated.  He has no peripheral edema.  I am adding furosemide 40 mg a day and we will check a basic metabolic panel in 1 week.  He will follow-up with Coletta Memos, FNP in 2 weeks and me back in 1 month.  He may need his diuretic dose adjusted when he sees [employer] in 2 weeks.

## 2019-09-29 NOTE — Progress Notes (Signed)
09/29/2019 SELIM TORUNO   24-Dec-1940  [id number]  Primary Physician Aletha Halim., PA-C Primary Cardiologist: Lorretta Harp MD FACP, [place], [place], [place]  HPI:  Riley Eaton is a 79 y.o.  mildly overweight married Caucasian male  father of 38, grandfather of 4 grandchildren. Retired from being in the [employer]. He is referred by Leonia Reader PA-C at [facility] for cardiovascular evaluation because of an episode of shortness of breath, and an incidentally noted thoracic aortic aneurysm.I last saw him in the office  08/31/2019.Marland KitchenHe basically has no cardiac risk factors. He's never had a heart attack or stroke. He denies chest pain or shortness of breath. A Myoview stress test was performed 10/20/15 which was entirely normal. A CT scan revealed a thoracic aortic aneurysm measuring 4.1 cm.  He  was admitted in early December for altered mental status.  He did have a troponin of 2100 and EKG changes.  2D echo revealed EF of 30% which is new compared to the EF on Myoview stress test performed 10/20/2015 which was normal.  He was not a candidate for cardiac catheterization because of severe renal insufficiency nor he is a candidate for coronary CTA because of this as well.  He gets occasional although infrequent chest pain.  2D echo did not comment on thoracic aortic aneurysm.  Since I saw him a month ago he is noticed increasing dyspnea on exertion and orthopnea.  He denies chest pain.  He was scheduled for coronary CTA but this will be canceled because of his severe renal insufficiency.    Current Meds  Medication Sig  . allopurinol (ZYLOPRIM) 100 MG tablet TAKE 1 TABLET BY MOUTH EVERY DAY (Patient taking differently: Take 100 mg by mouth daily as needed (gout flare up). )  . aspirin 81 MG chewable tablet Chew 1 tablet (81 mg total) by mouth daily.  [name] atorvastatin (LIPITOR) 40 MG tablet Take 1 tablet (40 mg total) by mouth daily at 6 PM.  . carvedilol (COREG) 12.5 MG tablet  Take 1 tablet (12.5 mg total) by mouth 2 (two) times daily with a meal.  . cloNIDine (CATAPRES - DOSED IN MG/24 HR) 0.1 mg/24hr patch Place 1 patch (0.1 mg total) onto the skin once a week.  . finasteride (PROSCAR) 5 MG tablet Take 1 tablet (5 mg total) by mouth daily. For urination  . metoprolol tartrate (LOPRESSOR) 50 MG tablet Take 1 tablet (50 mg total) by mouth once for 1 dose. 2 HOURS PRIOR TO CT  . OLANZapine (ZYPREXA) 5 MG tablet Take 1 tablet (5 mg total) by mouth daily.  . pantoprazole (PROTONIX) 40 MG tablet Take 1 tablet (40 mg total) by mouth at bedtime.  . polyethylene glycol (MIRALAX / GLYCOLAX) 17 g packet Take 17 g by mouth daily.  . tamsulosin (FLOMAX) 0.4 MG CAPS capsule Take 1 capsule (0.4 mg total) by mouth daily.  . traZODone (DESYREL) 50 MG tablet Take 50 mg by mouth at bedtime.     No Known Allergies  Social History   Socioeconomic History  . Marital status: Married    Spouse name: Not on file  . Number of children: 3  . Years of education: 53  . Highest education level: Not on file  Occupational History  . Not on file  Tobacco Use  . Smoking status: Never Smoker  . Smokeless tobacco: Never Used  Substance and Sexual Activity  . Alcohol use: No  . Drug use: No  . Sexual  activity: Not on file  Other Topics Concern  . Not on file  Social History Narrative   Lives with wife   Caffeine use: Drinks 6 cups coffee per day   Right handed    Social Determinants of Health   Financial Resource Strain:   . Difficulty of Paying Living Expenses: Not on file  Food Insecurity:   . Worried About Charity fundraiser in the Last Year: Not on file  . Ran Out of Food in the Last Year: Not on file  Transportation Needs:   . Lack of Transportation (Medical): Not on file  . Lack of Transportation (Non-Medical): Not on file  Physical Activity:   . Days of Exercise per Week: Not on file  . Minutes of Exercise per Session: Not on file  Stress:   . Feeling of Stress :  Not on file  Social Connections:   . Frequency of Communication with Friends and Family: Not on file  . Frequency of Social Gatherings with Friends and Family: Not on file  . Attends Religious Services: Not on file  . Active Member of Clubs or Organizations: Not on file  . Attends Archivist Meetings: Not on file  . Marital Status: Not on file  Intimate Partner Violence:   . Fear of Current or Ex-Partner: Not on file  . Emotionally Abused: Not on file  . Physically Abused: Not on file  . Sexually Abused: Not on file     Review of Systems: General: negative for chills, fever, night sweats or weight changes.  Cardiovascular: negative for chest pain, dyspnea on exertion, edema, orthopnea, palpitations, paroxysmal nocturnal dyspnea or shortness of breath Dermatological: negative for rash Respiratory: negative for cough or wheezing Urologic: negative for hematuria Abdominal: negative for nausea, vomiting, diarrhea, bright red blood per rectum, melena, or hematemesis Neurologic: negative for visual changes, syncope, or dizziness All other systems reviewed and are otherwise negative except as noted above.    Blood pressure (!) 148/75, pulse 61, height 5\' 7"  (1.702 m), weight 173 lb 9.6 oz (78.7 kg), SpO2 98 %.  General appearance: alert and no distress Neck: no adenopathy, no carotid bruit, no JVD, supple, symmetrical, trachea midline and thyroid not enlarged, symmetric, no tenderness/mass/nodules Lungs: clear to auscultation bilaterally Heart: regular rate and rhythm, S1, S2 normal, no murmur, click, rub or gallop Extremities: extremities normal, atraumatic, no cyanosis or edema Pulses: 2+ and symmetric Skin: Skin color, texture, turgor normal. No rashes or lesions Neurologic: Alert and oriented X 3, normal strength and tone. Normal symmetric reflexes. Normal coordination and gait  EKG sinus bradycardia at 50 without ST or T wave changes.  I personally reviewed this  EKG.  ASSESSMENT AND PLAN:   DCM (dilated cardiomyopathy) (HCC) Severe LV dysfunction with an EF in the 30% range.  When I saw him back a month ago he was not complaining of dyspnea on exertion or orthopnea although he always is somewhat short of breath.  His creatinine is in the mid 4 range.  He is not a candidate for coronary CTA because of this.  He does not appear to be volume overloaded on exam.  His lungs are clear.  Jugular venous pressure is mildly elevated.  He has no peripheral edema.  I am adding furosemide 40 mg a day and we will check a basic metabolic panel in 1 week.  He will follow-up with Coletta Memos, FNP in 2 weeks and me back in 1 month.  He may need  his diuretic dose adjusted when he sees Coletta Memos in 2 weeks.      Lorretta Harp MD FACP,FACC,FAHA, [facility] 09/29/2019 10:28 AM

## 2019-09-29 NOTE — Patient Instructions (Signed)
Medication Instructions:  Start taking 40mg  Lasix Daily  If you need a refill on your cardiac medications before your next appointment, please call your pharmacy.   Lab work: BMET in 1 week If you have labs (blood work) drawn today and your tests are completely normal, you will receive your results only by: [place] (if you have MyChart) OR A paper copy in the mail If you have any lab test that is abnormal or we need to change your treatment, we will call you to review the results.  Testing/Procedures: NONE  Follow-Up: At [facility], you and your health needs are our priority.  As part of our continuing mission to provide you with exceptional heart care, we have created designated Provider Care Teams.  These Care Teams include your primary Cardiologist (physician) and Advanced Practice Providers (APPs -  Physician Assistants and Nurse Practitioners) who all work together to provide you with the care you need, when you need it. You may see Quay Burow, MD or one of the following Advanced Practice Providers on your designated Care Team:    Kerin Ransom, PA-C  [place], [place]  Coletta Memos, [place]  Your physician wants you to follow-up in: 2 weeks with Coletta Memos, FNP Your physician wants you to follow-up in: 1 month with Dr. Gwenlyn Found

## 2019-10-01 ENCOUNTER — Ambulatory Visit (HOSPITAL_COMMUNITY): Payer: Medicare PPO

## 2019-10-06 LAB — BASIC METABOLIC PANEL
BUN/Creatinine Ratio: 23 (ref 10–24)
BUN: 31 mg/dL — ABNORMAL HIGH (ref 8–27)
CO2: 25 mmol/L (ref 20–29)
Calcium: 9.2 mg/dL (ref 8.6–10.2)
Chloride: 102 mmol/L (ref 96–106)
Creatinine, Ser: 1.33 mg/dL — ABNORMAL HIGH (ref 0.76–1.27)
GFR calc Af Amer: 58 mL/min/{1.73_m2} — ABNORMAL LOW (ref >59)
GFR calc non Af Amer: 50 mL/min/{1.73_m2} — ABNORMAL LOW (ref >59)
Glucose: 120 mg/dL — ABNORMAL HIGH (ref 65–99)
Potassium: 4.3 mmol/L (ref 3.5–5.2)
Sodium: 140 mmol/L (ref 134–144)

## 2019-10-11 NOTE — Progress Notes (Signed)
Cardiology Clinic Note   Patient Name: Riley Eaton Date of Encounter: 10/15/2019  Primary Care Provider:  Aletha Halim., PA-C Primary Cardiologist:  Quay Burow, MD  Patient Profile    Riley Eaton 79 year old male presents today for follow-up of his dilated cardiomyopathy and increased work of breathing.  Past Medical History    Past Medical History:  Diagnosis Date  . Chronic back pain   . 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 Chronic, continuous use of opioids    for back pain  . Coronary artery calcification seen on CT scan   . Nerve pain   . Thoracic aortic aneurysm [facility])    Past Surgical History:  Procedure Laterality Date  . BACK SURGERY     09-2016  . back surgey    . ENDOSCOPIC RETROGRADE CHOLANGIOPANCREATOGRAPHY (ERCP) WITH PROPOFOL N/A 09/25/2018   Procedure: ENDOSCOPIC RETROGRADE CHOLANGIOPANCREATOGRAPHY (ERCP) WITH PROPOFOL;  Surgeon: Ronnette Juniper, MD;  Location: [place];  Service: Gastroenterology;  Laterality: N/A;  . PANCREATIC STENT PLACEMENT  09/25/2018   Procedure: PANCREATIC STENT PLACEMENT;  Surgeon: Ronnette Juniper, MD;  Location: [facility] ENDOSCOPY;  Service: Gastroenterology;;  . REMOVAL OF STONES  09/25/2018   Procedure: REMOVAL OF STONES;  Surgeon: Ronnette Juniper, MD;  Location: [place];  Service: Gastroenterology;;  . [name]  09/25/2018   Procedure: [name];  Surgeon: Ronnette Juniper, MD;  Location: [facility] ENDOSCOPY;  Service: Gastroenterology;;    Allergies  No Known Allergies  History of Present Illness    Mr. Guillory has a past medical history of thoracic aortic aneurysm, coronary artery disease, chronic back pain, chronic opioid use, dilated cardiac myopathy, AMS, pancreatic stent placement, and non-STEMI.  He was seen in hospital consultation on 07/13/2019 for elevated cardiac enzymes in the setting of altered mental status and cholelithiasis.  Consult was requested by Dr. Benny Lennert.  Her echocardiogram from 07/14/2019 showed  an LVEF of 30 to 35%, LV grossly severely decreased function, mild LVH, and grade 1 diastolic dysfunction.  Troponin peak 2110.  He was not a candidate for cath due to AMS and acute renal failure. He was started on losartan and spironolactone however, medications were DC'd due to his acute renal failure 07/27/2019. BUN 51 and Creat 3.77 at DC on 08/02/19 and trending down.   He was seen virtually 08/18/2019 for follow-up and stated he  had some nauseousness since returning home from his rehab facility.  He had been busy with activities of daily living but had not resumed his rehab exercises due to his nausea.  He planed to resume his rehab exercises as soon as he was able.  He stated overall he felt well and had only noticed mild indigestion that is relieved with belching and ambulation.    I  ordered a coronary CTA which was canceled due to his renal status.  The triage nurse line was contacted 09/28/2019 due to increased work of breathing and weight gain.  He was seen by Dr. Gwenlyn Found on 09/29/2019.  At that time he was prescribed furosemide 40 mg daily.  His BMP 10/06/2019 showed a creatinine of 1.33 and a BUN of 31.  He was noticing increased dyspnea on exertion and orthopnea.  He denied chest pain.  He presents to the clinic today for follow-up and states he is feeling much better after taking increased dose of Lasix.  He has been monitoring his diet and staying away from sodium.  His activity is limited due to his back pain.  I will  order a BMP today, continue his furosemide 40 mg daily, and have him follow-up with me in 2 months.  Today he denies chest pain, shortness of breath, lower extremity edema, fatigue, palpitations, melena, hematuria, hemoptysis, diaphoresis, weakness, presyncope, syncope, orthopnea, and PND.  Home Medications    Prior to Admission medications   Medication Sig Start Date End Date Taking? Authorizing Provider  allopurinol (ZYLOPRIM) 100 MG tablet TAKE 1 TABLET BY MOUTH EVERY  DAY Patient taking differently: Take 100 mg by mouth daily as needed (gout flare up).  03/16/19   Kathrynn Ducking, MD  aspirin 81 MG chewable tablet Chew 1 tablet (81 mg total) by mouth daily. 08/02/19   Swayze, Ava, DO  atorvastatin (LIPITOR) 40 MG tablet Take 1 tablet (40 mg total) by mouth daily at 6 PM. 08/02/19   Swayze, Ava, DO  carvedilol (COREG) 12.5 MG tablet Take 1 tablet (12.5 mg total) by mouth 2 (two) times daily with a meal. 08/02/19   Swayze, Ava, DO  cloNIDine (CATAPRES - DOSED IN MG/24 HR) 0.1 mg/24hr patch Place 1 patch (0.1 mg total) onto the skin once a week. 08/02/19   Swayze, Ava, DO  finasteride (PROSCAR) 5 MG tablet Take 1 tablet (5 mg total) by mouth daily. For urination 07/27/19   Alexis Frock, MD  furosemide (LASIX) 40 MG tablet Take 1 tablet (40 mg total) by mouth daily. 09/29/19 12/28/19  Lorretta Harp, MD  metoprolol tartrate (LOPRESSOR) 50 MG tablet Take 1 tablet (50 mg total) by mouth once for 1 dose. 2 HOURS PRIOR TO CT 08/18/19 09/29/19  Deberah Pelton, NP  OLANZapine (ZYPREXA) 5 MG tablet Take 1 tablet (5 mg total) by mouth daily. 08/02/19   Swayze, Ava, DO  pantoprazole (PROTONIX) 40 MG tablet Take 1 tablet (40 mg total) by mouth at bedtime. 08/02/19   Swayze, Ava, DO  polyethylene glycol (MIRALAX / GLYCOLAX) 17 g packet Take 17 g by mouth daily. 08/02/19   Swayze, Ava, DO  tamsulosin (FLOMAX) 0.4 MG CAPS capsule Take 1 capsule (0.4 mg total) by mouth daily. 07/27/19   Alexis Frock, MD  traZODone (DESYREL) 50 MG tablet Take 50 mg by mouth at bedtime. 05/17/19   [provider]    Family History    Family History  Problem Relation Age of Onset  . Cancer Mother   . Heart attack Father    He indicated that his mother is deceased. He indicated that his father is deceased. He indicated that both of his sisters are alive.  Social History    Social History   Socioeconomic History  . Marital status: Married    Spouse name: Not on file  .  Number of children: 3  . Years of education: 61  . Highest education level: Not on file  Occupational History  . Not on file  Tobacco Use  . Smoking status: Never Smoker  . Smokeless tobacco: Never Used  Substance and Sexual Activity  . Alcohol use: No  . Drug use: No  . Sexual activity: Not on file  Other Topics Concern  . Not on file  Social History Narrative   Lives with wife   Caffeine use: Drinks 6 cups coffee per day   Right handed    Social Determinants of Health   Financial Resource Strain:   . Difficulty of Paying Living Expenses: Not on file  Food Insecurity:   . Worried About Charity fundraiser in the Last Year: Not on file  .  Ran Out of Food in the Last Year: Not on file  Transportation Needs:   . Lack of Transportation (Medical): Not on file  . Lack of Transportation (Non-Medical): Not on file  Physical Activity:   . Days of Exercise per Week: Not on file  . Minutes of Exercise per Session: Not on file  Stress:   . Feeling of Stress : Not on file  Social Connections:   . Frequency of Communication with Friends and Family: Not on file  . Frequency of Social Gatherings with Friends and Family: Not on file  . Attends Religious Services: Not on file  . Active Member of Clubs or Organizations: Not on file  . Attends Archivist Meetings: Not on file  . Marital Status: Not on file  Intimate Partner Violence:   . Fear of Current or Ex-Partner: Not on file  . Emotionally Abused: Not on file  . Physically Abused: Not on file  . Sexually Abused: Not on file     Review of Systems    General:  No chills, fever, night sweats or weight changes.  Cardiovascular:  No chest pain, dyspnea on exertion, edema, orthopnea, palpitations, paroxysmal nocturnal dyspnea. Dermatological: No rash, lesions/masses Respiratory: No cough, dyspnea Urologic: No hematuria, dysuria Abdominal:   No nausea, vomiting, diarrhea, bright red blood per rectum, melena, or  hematemesis Neurologic:  No visual changes, wkns, changes in mental status. All other systems reviewed and are otherwise negative except as noted above.  Physical Exam    VS:  BP 122/78   Pulse 76   Temp (!) 97.3 F (36.3 C)   Ht 5\' 7"  (1.702 m)   Wt 172 lb 12.8 oz (78.4 kg)   SpO2 93%   BMI 27.06 kg/m  , BMI Body mass index is 27.06 kg/m. GEN: Well nourished, well developed, in no acute distress. HEENT: normal. Neck: Supple, no JVD, carotid bruits, or masses. Cardiac: RRR, no murmurs, rubs, or gallops. No clubbing, cyanosis, edema.  Radials/DP/PT 2+ and equal bilaterally.  Respiratory:  Respirations regular and unlabored, clear to auscultation bilaterally. GI: Soft, nontender, nondistended, BS + x 4. MS: no deformity or atrophy. Skin: warm and dry, no rash. Neuro:  Strength and sensation are intact. Psych: Normal affect.  Accessory Clinical Findings    ECG personally reviewed by me today-normal sinus rhythm 76 bpm.  EKG 07/24/2019 Sinus rhythm with PVCs ST and T wave abnormalities consider lateral ischemia, QT prolongation 100 bpm  Echocardiogram 07/14/2019 IMPRESSIONS   1. Technically difficult study. Left ventricular ejection fraction, by visual estimation, is 30 to 35%. The left ventricle has grossly severely decreased function. Images are not sufficient to assess for regional wall motion abnormalities. There is  mildly increased left ventricular hypertrophy. 2. Left ventricular diastolic parameters are consistent with Grade I diastolic dysfunction (impaired relaxation). 3. Global right ventricle has normal systolic function.The right ventricular size is normal. 4. The mitral valve is normal in structure. No evidence of mitral valve regurgitation. 5. The tricuspid valve is not well visualized. Tricuspid valve regurgitation is not demonstrated. 6. The aortic valve was not well visualized. Aortic valve regurgitation is not visualized. 7. The pulmonic valve was  not well visualized. Pulmonic valve regurgitation is not visualized.  Nuclear stress stress 10/20/2015  The left ventricular ejection fraction is normal (55-65%).  Nuclear stress EF: 57%.  There was no ST segment deviation noted during stress.  The study is normal.  This is a low risk study.  Normal pharmacologic nuclear  stress test with no prior infarct and no ischemia.  Assessment & Plan   1.  Dilated cardiomyopathy- Echocardiogram 07/14/2019 showed an LVEF of 30-35% with mild LV H and grade 1 diastolic dysfunction.  07/27/2019 losartan and spironolactone DC'd due to acute kidney injury.  Furosemide 40 mg daily started on 09/28/2018 for increased work of breathing/oxygen desaturation/weight gain. Continue furosemide 40 mg daily Continue carvedilol 12.5 mg twice daily Continue clonidine patch 0.1 mg 1 to skin once a week. Heart healthy low-sodium diet-salty 6 given Daily weights Order BMP  NSTEMI-07/16/2019 EKG showed deep T wave inversion in inferior and anterolateral leads.  07/24/2019 EKG showed sinus rhythm with PVCs ST and T wave abnormalities consider lateral ischemia, QT prolongation 100 bpm.  At bedtime troponin peaked at 2110 (07/15/2019).  Echocardiogram 07/14/2019 showed an LVEF of 30-35% with mild LV H and grade 1 diastolic dysfunction.  Unable to undergo cath during 12/20 hospital admission due to AMS, sepsis, and AKI.  CTA canceled due to renal function.  Acute kidney injury-creatinine 07/24/2019 0.74, BUN 15.  On 07/30/2019 creatinine peaked at 4.36, BUN 57.  At discharge 08/02/2019 creatinine 3.77, BUN 51.  Follow-up BMP 10/06/2019 showed creatinine 1.33 with BUN 31.  Nephrology has signed off.    Disposition: Follow-up with me in 2 months.  Preoperative cardiac evaluation     Primary Cardiologist: Quay Burow, MD  Chart reviewed as part of pre-operative protocol coverage. Given past medical history and time since last visit, based on ACC/AHA guidelines, Derk  R Apostle would be at acceptable risk for the planned procedure without further cardiovascular testing.   His RCRI is a class IV risk, 11% risk of major cardiac event.  He is able to complete 4 METS of physical activity.  I will route this recommendation to the requesting party via Epic fax function and remove from pre-op pool.  Please call with questions.    Jossie Ng. [facility] [place] [address] Office [phone] Fax [phone]

## 2019-10-15 ENCOUNTER — Other Ambulatory Visit: Payer: Self-pay

## 2019-10-15 ENCOUNTER — Ambulatory Visit: Payer: Medicare PPO | Admitting: General Practice

## 2019-10-15 ENCOUNTER — Encounter: Payer: Self-pay | Admitting: General Practice

## 2019-10-15 ENCOUNTER — Telehealth: Payer: Self-pay | Admitting: Cardiovascular Disease

## 2019-10-15 ENCOUNTER — Other Ambulatory Visit: Payer: Self-pay | Admitting: Urology

## 2019-10-15 VITALS — BP 122/78 | HR 76 | Temp 97.3°F | Ht 67.0 in | Wt 172.8 lb

## 2019-10-15 DIAGNOSIS — I420 Dilated cardiomyopathy: Secondary | ICD-10-CM

## 2019-10-15 DIAGNOSIS — Z01818 Encounter for other preprocedural examination: Secondary | ICD-10-CM

## 2019-10-15 DIAGNOSIS — N179 Acute kidney failure, unspecified: Secondary | ICD-10-CM

## 2019-10-15 DIAGNOSIS — I214 Non-ST elevation (NSTEMI) myocardial infarction: Secondary | ICD-10-CM

## 2019-10-15 NOTE — Patient Instructions (Signed)
Medication Instructions:  CONTINUE LASIX 40MG   If you need a refill on your cardiac medications before your next appointment, please call your pharmacy.  Labwork: BMET TODAY HERE IN OUR OFFICE AT [facility]    If you have labs (blood work) drawn today and your tests are completely normal, you will receive your results only by: [name] MyChart Message (if you have MyChart) OR A paper copy in the mail If you have any lab test that is abnormal or we need to change your treatment, we will call you to review these results. You may go to any [facility] lab that is convenient for you however, we do have a lab in our office that is able to assist you. You do NOT need an appointment for our lab. Once in our office in our office lobby there is a podium where you sign-in and ring the doorbell to alert [place] that you are here. Lab is open 8:00am and closes at 4:00pm; closes for lunch from 12:45 - 1:45pm. PLEASE BRING A COPY OF YOUR INSURANCE CARD WITH YOU.  Special Instructions: TAKE AND LOG YOUR WEIGHT DAILY  YOU ARE CLEARED FOR YOUR SURGERY  PLEASE READ AND FOLLOW SALTY 6 ATTACHED  Follow-Up: END OF APRIL-MAY  In Person Coletta Memos, [place].    At [facility], you and your health needs are our priority.  As part of our continuing mission to provide you with exceptional heart care, we have created designated Provider Care Teams.  These Care Teams include your primary Cardiologist (physician) and Advanced Practice Providers (APPs -  Physician Assistants and Nurse Practitioners) who all work together to provide you with the care you need, when you need it.  Reduce your risk of getting COVID-19 With your heart disease it is especially important for people at increased risk of severe illness from COVID-19, and those who live with them, to protect themselves from getting COVID-19. The best way to protect yourself and to help reduce the spread of the virus that causes COVID-19 is to: [name] Limit your interactions with other  people as much as possible. . Take COVID-19 when you do interact with others. If you start feeling sick and think you may have COVID-19, get in touch with your healthcare provider within 24 hours.  Thank you for choosing [facility] at [facility]!!

## 2019-10-15 NOTE — Telephone Encounter (Signed)
   Primary Cardiologist: Quay Burow, MD  Chart reviewed as part of pre-operative protocol coverage.   Mr. Huso is scheduled to see Coletta Memos, NP in office today, therefore will defer preop assessment to him.   I will route this clearance request to [facility] and remove from pre-op pool.   Abigail Butts, PA-C 10/15/2019, 11:54 AM

## 2019-10-15 NOTE — Telephone Encounter (Signed)
   [facility] Pre-operative Risk Assessment    Request for surgical clearance:  1. What type of surgery is being performed? TURP   2. When is this surgery scheduled? 10/26/19   3. What type of clearance is required (medical clearance vs. Pharmacy clearance to hold med vs. Both)? Medical   4. Are there any medications that need to be held prior to surgery and how long? Asprin 5 days  5. Practice name and name of physician performing surgery? [facility], Dr. Irine Seal  6. What is your office phone number [phone]   7.   What is your office fax number [phone]  8.   Anesthesia type (None, local, MAC, general) ? [place] 10/15/2019, 10:02 AM  _________________________________________________________________   (provider comments below)

## 2019-10-16 LAB — BASIC METABOLIC PANEL
BUN/Creatinine Ratio: 21 (ref 10–24)
BUN: 25 mg/dL (ref 8–27)
CO2: 25 mmol/L (ref 20–29)
Calcium: 9.2 mg/dL (ref 8.6–10.2)
Chloride: 104 mmol/L (ref 96–106)
Creatinine, Ser: 1.21 mg/dL (ref 0.76–1.27)
GFR calc Af Amer: 65 mL/min/{1.73_m2} (ref >59)
GFR calc non Af Amer: 56 mL/min/{1.73_m2} — ABNORMAL LOW (ref >59)
Glucose: 103 mg/dL — ABNORMAL HIGH (ref 65–99)
Potassium: 4.5 mmol/L (ref 3.5–5.2)
Sodium: 142 mmol/L (ref 134–144)

## 2019-10-19 NOTE — Patient Instructions (Addendum)
DUE TO COVID-19 ONLY ONE VISITOR IS ALLOWED TO COME WITH YOU AND STAY IN THE WAITING ROOM ONLY DURING PRE OP AND PROCEDURE DAY OF SURGERY. THE 1 VISITOR MAY VISIT WITH YOU AFTER SURGERY IN YOUR PRIVATE ROOM DURING VISITING HOURS ONLY!  YOU NEED TO HAVE A COVID 19 TEST ON_  Friday  03/12/2021______ @_10 :15 AM______, THIS TEST MUST BE DONE BEFORE SURGERY, COME  [place], [address] [place] , [id number].  ([place]) ONCE YOUR COVID TEST IS COMPLETED, PLEASE BEGIN THE QUARANTINE INSTRUCTIONS AS OUTLINED IN YOUR HANDOUT.                [place]     Your procedure is scheduled on: Tuesday 10/26/2019   Report to [facility] Main  Entrance    Report to admitting at  [place]  AM     Call this number if you have problems the morning of surgery [phone]    Remember: Do not eat food or drink liquids :After Midnight.     BRUSH YOUR TEETH MORNING OF SURGERY AND RINSE YOUR MOUTH OUT, NO CHEWING GUM CANDY OR MINTS.     Take these medicines the morning of surgery with A SIP OF WATER: Gabapentin (Neurontin), Olanzapine (Zyprexa), Tamulosin (Flomax), Finesteride (Proscar)                                 You may not have any metal on your body including hair pins and              piercings  Do not wear jewelry, make-up, lotions, powders or perfumes, deodorant                         Men may shave face and neck.   Do not bring valuables to the hospital. [place].  Contacts, dentures or bridgework may not be worn into surgery.  Leave suitcase in the car. After surgery it may be brought to your room.                   Please read over the following fact sheets you were given: _____________________________________________________________________             [facility] - Preparing for Surgery Before surgery, you can play an important role.  Because skin is not sterile, your skin needs to be as free of germs  as possible.  You can reduce the number of germs on your skin by washing with CHG (chlorahexidine gluconate) soap before surgery.  CHG is an antiseptic cleaner which kills germs and bonds with the skin to continue killing germs even after washing. Please DO NOT use if you have an allergy to CHG or antibacterial soaps.  If your skin becomes reddened/irritated stop using the CHG and inform your nurse when you arrive at Short Stay. Do not shave (including legs and underarms) for at least 48 hours prior to the first CHG shower.  You may shave your face/neck. Please follow these instructions carefully:  1.  Shower with CHG Soap the night before surgery and the  morning of Surgery.  2.  If you choose to wash your hair, wash your hair first as usual with your  normal  shampoo.  3.  After you shampoo, rinse  your hair and body thoroughly to remove the  shampoo.                           4.  Use CHG as you would any other liquid soap.  You can apply chg directly  to the skin and wash                       Gently with a scrungie or clean washcloth.  5.  Apply the CHG Soap to your body ONLY FROM THE NECK DOWN.   Do not use on face/ open                           Wound or open sores. Avoid contact with eyes, ears mouth and genitals (private parts).                       Wash face,  Genitals (private parts) with your normal soap.             6.  Wash thoroughly, paying special attention to the area where your surgery  will be performed.  7.  Thoroughly rinse your body with warm water from the neck down.  8.  DO NOT shower/wash with your normal soap after using and rinsing off  the CHG Soap.                9.  Pat yourself dry with a clean towel.            10.  Wear clean pajamas.            11.  Place clean sheets on your bed the night of your first shower and do not  sleep with pets. Day of Surgery : Do not apply any lotions/deodorants the morning of surgery.  Please wear clean clothes to the hospital/surgery  center.  FAILURE TO FOLLOW THESE INSTRUCTIONS MAY RESULT IN THE CANCELLATION OF YOUR SURGERY PATIENT SIGNATURE_________________________________  NURSE SIGNATURE__________________________________  ________________________________________________________________________

## 2019-10-20 ENCOUNTER — Encounter (HOSPITAL_COMMUNITY)
Admission: RE | Admit: 2019-10-20 | Discharge: 2019-10-20 | Disposition: A | Discharge status: Home / Self Care | Payer: Medicare PPO | Source: Ambulatory Visit | Attending: Urology | Admitting: Urology

## 2019-10-20 ENCOUNTER — Other Ambulatory Visit: Payer: Self-pay

## 2019-10-20 ENCOUNTER — Encounter (HOSPITAL_COMMUNITY): Payer: Self-pay

## 2019-10-20 DIAGNOSIS — Z79899 Other long term (current) drug therapy: Secondary | ICD-10-CM | POA: Diagnosis not present

## 2019-10-20 DIAGNOSIS — N401 Enlarged prostate with lower urinary tract symptoms: Secondary | ICD-10-CM | POA: Insufficient documentation

## 2019-10-20 DIAGNOSIS — R338 Other retention of urine: Secondary | ICD-10-CM | POA: Diagnosis not present

## 2019-10-20 DIAGNOSIS — Z01812 Encounter for preprocedural laboratory examination: Secondary | ICD-10-CM | POA: Insufficient documentation

## 2019-10-20 DIAGNOSIS — Z7982 Long term (current) use of aspirin: Secondary | ICD-10-CM | POA: Diagnosis not present

## 2019-10-20 HISTORY — DX: Anxiety disorder, unspecified: F41.9

## 2019-10-20 HISTORY — DX: Heart failure, unspecified: I50.9

## 2019-10-20 HISTORY — DX: Depression, unspecified: F32.A

## 2019-10-20 LAB — BASIC METABOLIC PANEL
Anion gap: 10 (ref 5–15)
BUN: 27 mg/dL — ABNORMAL HIGH (ref 8–23)
CO2: 27 mmol/L (ref 22–32)
Calcium: 9.2 mg/dL (ref 8.9–10.3)
Chloride: 105 mmol/L (ref 98–111)
Creatinine, Ser: 1.16 mg/dL (ref 0.61–1.24)
GFR calc Af Amer: >60 mL/min (ref >60)
GFR calc non Af Amer: 59 mL/min — ABNORMAL LOW (ref >60)
Glucose, Bld: 102 mg/dL — ABNORMAL HIGH (ref 70–99)
Potassium: 3.9 mmol/L (ref 3.5–5.1)
Sodium: 142 mmol/L (ref 135–145)

## 2019-10-20 LAB — CBC
HCT: 33.7 % — ABNORMAL LOW (ref 39.0–52.0)
Hemoglobin: 11.4 g/dL — ABNORMAL LOW (ref 13.0–17.0)
MCH: 33.5 pg (ref 26.0–34.0)
MCHC: 33.8 g/dL (ref 30.0–36.0)
MCV: 99.1 fL (ref 80.0–100.0)
Platelets: 139 10*3/uL — ABNORMAL LOW (ref 150–400)
RBC: 3.4 MIL/uL — ABNORMAL LOW (ref 4.22–5.81)
RDW: 13 % (ref 11.5–15.5)
WBC: 4.1 10*3/uL (ref 4.0–10.5)
nRBC: 0 % (ref 0.0–0.2)

## 2019-10-20 LAB — SURGICAL PCR SCREEN
MRSA, PCR: NEGATIVE
Staphylococcus aureus: NEGATIVE

## 2019-10-20 NOTE — Progress Notes (Signed)
PCP - Bing Matter, PA Cardiologist - Dr. Quay Burow  LOV with Annamaria Boots on 10/15/2019 for pre-op evaluation  epic  Chest x-ray - 07/17/2019 epic EKG - 09/29/2019 epic Stress Test - n/a ECHO - n/a Cardiac Cath - n/a  Sleep Study - n/a CPAP - n/a  Fasting Blood Sugar - n/a Checks Blood Sugar ___0__ times a day  Blood Thinner Instructions:n/a Aspirin Instructions:Aspirin 81 mg. Instructed patient to call Dr. Gwenlyn Found to get instructions about stopping Aspirin. Patient verbalized understanding. Last Dose:  Anesthesia review:  Chart to be reviewed by Konrad Felix, PA.  Patient has a history of CHF (none for 2.5 months(, NSTEMI, carotid bruits, Thoracic aortic aneurysm.  Patient denies shortness of breath, fever, cough and chest pain at PAT appointment   Patient verbalized understanding of instructions that were given to them at the PAT appointment. Patient was also instructed that they will need to review over the PAT instructions again at home before surgery.

## 2019-10-21 NOTE — Progress Notes (Signed)
Anesthesia Chart Review   Case: [id number] Date/Time: 10/26/19 0930   Procedures:      TRANSURETHRAL RESECTION OF THE PROSTATE (TURP) (N/A )     INSERTION OF SUPRAPUBIC CATHETER (N/A )   Anesthesia type: General   Pre-op diagnosis: BENIGN PROSTATE HYPERPLASIA WITH RETENTION   Location: [facility] ROOM 08 / [facility]   Surgeons: Irine Seal, MD      DISCUSSION:79 y.o. former smoker with h/o thoracic aortic aneurysm, CAD, dilated cardiomyopathy, NSTEMI 07/2019, BPH with retention scheduled for above procedure 10/26/19 with Dr. Irine Seal.   Per cardiology, "He  was admitted in early December (2020) for altered mental status. He did have a troponin of 2100 and EKG changes. 2D echo revealed EF of 30% which is new compared to the EF on Myoview stress test performed 10/20/2015 which was normal. He was not a candidate for cardiac catheterization because of severe renal insufficiency nor he is a candidate for coronary CTA because of this as well. He gets occasional although infrequent chest pain. 2D echo did not comment on thoracic aortic aneurysm."  Pt last seen by cardiology 10/15/2019.  Stable at this visit.  Per OV note, "Chart reviewed as part of pre-operative protocol coverage. Given past medical history and time since last visit, based on ACC/AHA guidelines, Riley Eaton would be at acceptable risk for the planned procedure without further cardiovascular testing.  His RCRI is a class IV risk, 11% risk of major cardiac event.  He is able to complete 4 METS of physical activity."  Anticipate pt can proceed with planned procedure barring acute status change.   VS: BP 109/83   Pulse 69   Temp 36.6 C   Resp 18   Ht 5\' 7"  (1.702 m)   Wt 77.3 kg   SpO2 99%   BMI 26.69 kg/m   PROVIDERS: Aletha Halim., PA-C is PCP   Quay Burow, MD is Cardiologist  LABS: Labs reviewed: Acceptable for surgery. (all labs ordered are listed, but only abnormal results are displayed)  Labs Reviewed  BASIC  METABOLIC PANEL - Abnormal; Notable for the following components:      Result Value   Glucose, Bld 102 (*)    BUN 27 (*)    GFR calc non Af Amer 59 (*)    All other components within normal limits  CBC - Abnormal; Notable for the following components:   RBC 3.40 (*)    Hemoglobin 11.4 (*)    HCT 33.7 (*)    Platelets 139 (*)    All other components within normal limits  SURGICAL PCR SCREEN     IMAGES:   EKG: 09/29/2019 Rate 50 bpm Sinus bradycardia with marked sinus arrhythmia   CV: Echo 07/14/2019 IMPRESSIONS    1. Technically difficult study. Left ventricular ejection fraction, by  visual estimation, is 30 to 35%. The left ventricle has grossly severely  decreased function. Images are not sufficient to assess for regional wall  motion abnormalities. There is  mildly increased left ventricular hypertrophy.  2. Left ventricular diastolic parameters are consistent with Grade I  diastolic dysfunction (impaired relaxation).  3. Global right ventricle has normal systolic function.The right  ventricular size is normal.  4. The mitral valve is normal in structure. No evidence of mitral valve  regurgitation.  5. The tricuspid valve is not well visualized. Tricuspid valve  regurgitation is not demonstrated.  6. The aortic valve was not well visualized. Aortic valve regurgitation  is not visualized.  7.  The pulmonic valve was not well visualized. Pulmonic valve  regurgitation is not visualized.  Myocardial Perfusion 10/20/2015  The left ventricular ejection fraction is normal (55-65%).  Nuclear stress EF: 57%.  There was no ST segment deviation noted during stress.  The study is normal.  This is a low risk study.   Normal pharmacologic nuclear stress test with no prior infarct and no ischemia Past Medical History:  Diagnosis Date  . Anxiety   . CHF (congestive heart failure) (HCC)    not in last 2.5 months  . Chronic back pain   . Chronic low back pain  05/19/2017  . Chronic, continuous use of opioids    for back pain  . Coronary artery calcification seen on CT scan   . Depression   . Encephalopathy 07/14/2019  . Myocardial infarction ([place]) 07/2019  . Nerve pain   . Thoracic aortic aneurysm [facility])     Past Surgical History:  Procedure Laterality Date  . BACK SURGERY     09-2016  . back surgey    . ENDOSCOPIC RETROGRADE CHOLANGIOPANCREATOGRAPHY (ERCP) WITH PROPOFOL N/A 09/25/2018   Procedure: ENDOSCOPIC RETROGRADE CHOLANGIOPANCREATOGRAPHY (ERCP) WITH PROPOFOL;  Surgeon: Ronnette Juniper, MD;  Location: [facility];  Service: Gastroenterology;  Laterality: N/A;  . PANCREATIC STENT PLACEMENT  09/25/2018   Procedure: PANCREATIC STENT PLACEMENT;  Surgeon: Ronnette Juniper, MD;  Location: [facility] ENDOSCOPY;  Service: Gastroenterology;;  . REMOVAL OF STONES  09/25/2018   Procedure: REMOVAL OF STONES;  Surgeon: Ronnette Juniper, MD;  Location: [facility] ENDOSCOPY;  Service: Gastroenterology;;  . SPHINCTEROTOMY  09/25/2018   Procedure: [name];  Surgeon: Ronnette Juniper, MD;  Location: [facility];  Service: Gastroenterology;;    MEDICATIONS: . allopurinol (ZYLOPRIM) 100 MG tablet  . aspirin 81 MG chewable tablet  . atorvastatin (LIPITOR) 40 MG tablet  . carvedilol (COREG) 12.5 MG tablet  . cloNIDine (CATAPRES - DOSED IN MG/24 HR) 0.1 mg/24hr patch  . ferrous sulfate 325 (65 FE) MG EC tablet  . finasteride (PROSCAR) 5 MG tablet  . furosemide (LASIX) 40 MG tablet  . gabapentin (NEURONTIN) 300 MG capsule  . metoprolol tartrate (LOPRESSOR) 50 MG tablet  . OLANZapine (ZYPREXA) 5 MG tablet  . pantoprazole (PROTONIX) 40 MG tablet  . polyethylene glycol (MIRALAX / GLYCOLAX) 17 g packet  . tamsulosin (FLOMAX) 0.4 MG CAPS capsule  . traZODone (DESYREL) 50 MG tablet   No current facility-administered medications for this encounter.     Maia Plan [facility] Pre-Surgical Testing [phone] 10/22/19  11:46 AM

## 2019-10-22 ENCOUNTER — Other Ambulatory Visit (HOSPITAL_COMMUNITY)
Admission: RE | Admit: 2019-10-22 | Discharge: 2019-10-22 | Disposition: A | Discharge status: Home / Self Care | Payer: Medicare PPO | Source: Ambulatory Visit | Attending: Urology | Admitting: Urology

## 2019-10-22 DIAGNOSIS — Z01812 Encounter for preprocedural laboratory examination: Secondary | ICD-10-CM | POA: Diagnosis present

## 2019-10-22 DIAGNOSIS — Z20822 Contact with and (suspected) exposure to covid-19: Secondary | ICD-10-CM | POA: Diagnosis not present

## 2019-10-22 LAB — SARS CORONAVIRUS 2 (TAT 6-24 HRS): SARS Coronavirus 2: NEGATIVE

## 2019-10-25 MED ORDER — GENTAMICIN SULFATE 40 MG/ML IJ SOLN
360.0000 mg | Freq: Once | INTRAVENOUS | Status: AC
  Administered 2019-10-26: 360 mg via INTRAVENOUS
  Filled 2019-10-25: qty 9

## 2019-10-26 ENCOUNTER — Ambulatory Visit (HOSPITAL_COMMUNITY): Payer: Medicare PPO | Admitting: Physician Assistant

## 2019-10-26 ENCOUNTER — Encounter (HOSPITAL_COMMUNITY)
Admission: AD | Disposition: A | Discharge status: Home / Self Care | Payer: Self-pay | Source: Home / Self Care | Attending: Urology

## 2019-10-26 ENCOUNTER — Encounter (HOSPITAL_COMMUNITY): Payer: Self-pay | Admitting: Urology

## 2019-10-26 ENCOUNTER — Observation Stay (HOSPITAL_COMMUNITY)
Admission: AD | Admit: 2019-10-26 | Discharge: 2019-10-27 | Disposition: A | Discharge status: Home / Self Care | Payer: Medicare PPO | Attending: Urology | Admitting: Urology

## 2019-10-26 ENCOUNTER — Ambulatory Visit (HOSPITAL_COMMUNITY): Payer: Medicare PPO | Admitting: Certified Registered Nurse Anesthetist

## 2019-10-26 DIAGNOSIS — Z87442 Personal history of urinary calculi: Secondary | ICD-10-CM | POA: Insufficient documentation

## 2019-10-26 DIAGNOSIS — Z87891 Personal history of nicotine dependence: Secondary | ICD-10-CM | POA: Insufficient documentation

## 2019-10-26 DIAGNOSIS — K219 Gastro-esophageal reflux disease without esophagitis: Secondary | ICD-10-CM | POA: Insufficient documentation

## 2019-10-26 DIAGNOSIS — M109 Gout, unspecified: Secondary | ICD-10-CM | POA: Insufficient documentation

## 2019-10-26 DIAGNOSIS — F419 Anxiety disorder, unspecified: Secondary | ICD-10-CM | POA: Diagnosis not present

## 2019-10-26 DIAGNOSIS — R7303 Prediabetes: Secondary | ICD-10-CM | POA: Diagnosis not present

## 2019-10-26 DIAGNOSIS — R338 Other retention of urine: Secondary | ICD-10-CM | POA: Insufficient documentation

## 2019-10-26 DIAGNOSIS — I252 Old myocardial infarction: Secondary | ICD-10-CM | POA: Diagnosis not present

## 2019-10-26 DIAGNOSIS — I509 Heart failure, unspecified: Secondary | ICD-10-CM | POA: Diagnosis not present

## 2019-10-26 DIAGNOSIS — R339 Retention of urine, unspecified: Secondary | ICD-10-CM | POA: Diagnosis present

## 2019-10-26 DIAGNOSIS — Z9049 Acquired absence of other specified parts of digestive tract: Secondary | ICD-10-CM | POA: Diagnosis not present

## 2019-10-26 DIAGNOSIS — N401 Enlarged prostate with lower urinary tract symptoms: Principal | ICD-10-CM | POA: Insufficient documentation

## 2019-10-26 DIAGNOSIS — I2510 Atherosclerotic heart disease of native coronary artery without angina pectoris: Secondary | ICD-10-CM | POA: Insufficient documentation

## 2019-10-26 DIAGNOSIS — Z7982 Long term (current) use of aspirin: Secondary | ICD-10-CM | POA: Diagnosis not present

## 2019-10-26 DIAGNOSIS — G40909 Epilepsy, unspecified, not intractable, without status epilepticus: Secondary | ICD-10-CM | POA: Diagnosis not present

## 2019-10-26 DIAGNOSIS — F329 Major depressive disorder, single episode, unspecified: Secondary | ICD-10-CM | POA: Diagnosis not present

## 2019-10-26 DIAGNOSIS — N320 Bladder-neck obstruction: Secondary | ICD-10-CM | POA: Diagnosis not present

## 2019-10-26 DIAGNOSIS — Z79899 Other long term (current) drug therapy: Secondary | ICD-10-CM | POA: Diagnosis not present

## 2019-10-26 DIAGNOSIS — N138 Other obstructive and reflux uropathy: Secondary | ICD-10-CM | POA: Diagnosis present

## 2019-10-26 DIAGNOSIS — N312 Flaccid neuropathic bladder, not elsewhere classified: Secondary | ICD-10-CM | POA: Diagnosis not present

## 2019-10-26 HISTORY — PX: INSERTION OF SUPRAPUBIC CATHETER: SHX5870

## 2019-10-26 HISTORY — PX: TRANSURETHRAL RESECTION OF PROSTATE: SHX73

## 2019-10-26 LAB — GLUCOSE, CAPILLARY
Glucose-Capillary: 286 mg/dL — ABNORMAL HIGH (ref 70–99)
Glucose-Capillary: 151 mg/dL — ABNORMAL HIGH (ref 70–99)

## 2019-10-26 SURGERY — TURP (TRANSURETHRAL RESECTION OF PROSTATE)
Anesthesia: General

## 2019-10-26 MED ORDER — GABAPENTIN 300 MG PO CAPS
600.0000 mg | ORAL_CAPSULE | Freq: Three times a day (TID) | ORAL | Status: DC
  Administered 2019-10-26 – 2019-10-27 (×3): 600 mg via ORAL
  Filled 2019-10-26 (×3): qty 2

## 2019-10-26 MED ORDER — PANTOPRAZOLE SODIUM 40 MG PO TBEC
40.0000 mg | DELAYED_RELEASE_TABLET | Freq: Every day | ORAL | Status: DC
  Administered 2019-10-26: 40 mg via ORAL
  Filled 2019-10-26: qty 1

## 2019-10-26 MED ORDER — ACETAMINOPHEN 500 MG PO TABS
1000.0000 mg | ORAL_TABLET | Freq: Once | ORAL | Status: AC
  Administered 2019-10-26: 1000 mg via ORAL

## 2019-10-26 MED ORDER — FLEET ENEMA 7-19 GM/118ML RE ENEM: 1.0000 | ENEMA | Freq: Once | RECTAL | Status: DC | PRN

## 2019-10-26 MED ORDER — DEXAMETHASONE SODIUM PHOSPHATE 10 MG/ML IJ SOLN
INTRAMUSCULAR | Status: AC
  Filled 2019-10-26: qty 1

## 2019-10-26 MED ORDER — ACETAMINOPHEN 325 MG PO TABS: 650.0000 mg | ORAL_TABLET | ORAL | Status: DC | PRN

## 2019-10-26 MED ORDER — PHENYLEPHRINE 40 MCG/ML (10ML) SYRINGE FOR IV PUSH (FOR BLOOD PRESSURE SUPPORT)
PREFILLED_SYRINGE | INTRAVENOUS | Status: AC
  Filled 2019-10-26: qty 10

## 2019-10-26 MED ORDER — SENNOSIDES-DOCUSATE SODIUM 8.6-50 MG PO TABS: 1.0000 | ORAL_TABLET | Freq: Every evening | ORAL | Status: DC | PRN

## 2019-10-26 MED ORDER — SUGAMMADEX SODIUM 200 MG/2ML IV SOLN
INTRAVENOUS | Status: DC | PRN
  Administered 2019-10-26: 200 mg via INTRAVENOUS

## 2019-10-26 MED ORDER — ONDANSETRON HCL 4 MG/2ML IJ SOLN: 4.0000 mg | INTRAMUSCULAR | Status: DC | PRN

## 2019-10-26 MED ORDER — DIPHENHYDRAMINE HCL 50 MG/ML IJ SOLN: 12.5000 mg | Freq: Four times a day (QID) | INTRAMUSCULAR | Status: DC | PRN

## 2019-10-26 MED ORDER — FUROSEMIDE 40 MG PO TABS
40.0000 mg | ORAL_TABLET | Freq: Every day | ORAL | Status: DC
  Administered 2019-10-26 – 2019-10-27 (×2): 40 mg via ORAL
  Filled 2019-10-26 (×2): qty 1

## 2019-10-26 MED ORDER — DEXAMETHASONE SODIUM PHOSPHATE 4 MG/ML IJ SOLN
INTRAMUSCULAR | Status: DC | PRN
  Administered 2019-10-26: 4 mg via INTRAVENOUS

## 2019-10-26 MED ORDER — FENTANYL CITRATE (PF) 100 MCG/2ML IJ SOLN
INTRAMUSCULAR | Status: DC | PRN
  Administered 2019-10-26: 50 ug via INTRAVENOUS

## 2019-10-26 MED ORDER — SODIUM CHLORIDE 0.9 % IR SOLN: 3000.0000 mL | Status: DC

## 2019-10-26 MED ORDER — SODIUM CHLORIDE 0.9 % IR SOLN
Status: DC | PRN
  Administered 2019-10-26: 18000 mL

## 2019-10-26 MED ORDER — ROCURONIUM BROMIDE 10 MG/ML (PF) SYRINGE
PREFILLED_SYRINGE | INTRAVENOUS | Status: AC
  Filled 2019-10-26: qty 10

## 2019-10-26 MED ORDER — LACTATED RINGERS IV SOLN: INTRAVENOUS | Status: DC

## 2019-10-26 MED ORDER — FINASTERIDE 5 MG PO TABS
5.0000 mg | ORAL_TABLET | Freq: Every day | ORAL | Status: DC
  Administered 2019-10-27: 5 mg via ORAL
  Filled 2019-10-26: qty 1

## 2019-10-26 MED ORDER — DIPHENHYDRAMINE HCL 12.5 MG/5ML PO ELIX: 12.5000 mg | ORAL_SOLUTION | Freq: Four times a day (QID) | ORAL | Status: DC | PRN

## 2019-10-26 MED ORDER — ATORVASTATIN CALCIUM 40 MG PO TABS
40.0000 mg | ORAL_TABLET | Freq: Every day | ORAL | Status: DC
  Administered 2019-10-26: 40 mg via ORAL
  Filled 2019-10-26: qty 1

## 2019-10-26 MED ORDER — ACETAMINOPHEN 500 MG PO TABS
ORAL_TABLET | ORAL | Status: AC
  Filled 2019-10-26: qty 2

## 2019-10-26 MED ORDER — POLYETHYLENE GLYCOL 3350 17 G PO PACK
17.0000 g | PACK | Freq: Every day | ORAL | Status: DC
  Administered 2019-10-27: 17 g via ORAL
  Filled 2019-10-26 (×2): qty 1

## 2019-10-26 MED ORDER — ONDANSETRON HCL 4 MG/2ML IJ SOLN
INTRAMUSCULAR | Status: DC | PRN
  Administered 2019-10-26: 4 mg via INTRAVENOUS

## 2019-10-26 MED ORDER — STERILE WATER FOR IRRIGATION IR SOLN
Status: DC | PRN
  Administered 2019-10-26: 500 mL

## 2019-10-26 MED ORDER — FERROUS SULFATE 325 (65 FE) MG PO TABS
325.0000 mg | ORAL_TABLET | Freq: Two times a day (BID) | ORAL | Status: DC
  Administered 2019-10-26 – 2019-10-27 (×3): 325 mg via ORAL
  Filled 2019-10-26 (×3): qty 1

## 2019-10-26 MED ORDER — 0.9 % SODIUM CHLORIDE (POUR BTL) OPTIME
TOPICAL | Status: DC | PRN
  Administered 2019-10-26: 1000 mL

## 2019-10-26 MED ORDER — OXYCODONE HCL 5 MG PO TABS
5.0000 mg | ORAL_TABLET | ORAL | Status: DC | PRN
  Administered 2019-10-27: 5 mg via ORAL
  Filled 2019-10-26: qty 1

## 2019-10-26 MED ORDER — TRAZODONE HCL 50 MG PO TABS
50.0000 mg | ORAL_TABLET | Freq: Every day | ORAL | Status: DC
  Administered 2019-10-26: 50 mg via ORAL
  Filled 2019-10-26: qty 1

## 2019-10-26 MED ORDER — OLANZAPINE 5 MG PO TABS
5.0000 mg | ORAL_TABLET | Freq: Every day | ORAL | Status: DC
  Administered 2019-10-27: 5 mg via ORAL
  Filled 2019-10-26: qty 1

## 2019-10-26 MED ORDER — HYOSCYAMINE SULFATE 0.125 MG SL SUBL
0.1250 mg | SUBLINGUAL_TABLET | SUBLINGUAL | Status: DC | PRN
  Filled 2019-10-26: qty 1

## 2019-10-26 MED ORDER — FENTANYL CITRATE (PF) 100 MCG/2ML IJ SOLN
INTRAMUSCULAR | Status: AC
  Filled 2019-10-26: qty 2

## 2019-10-26 MED ORDER — FENTANYL CITRATE (PF) 100 MCG/2ML IJ SOLN: 25.0000 ug | INTRAMUSCULAR | Status: DC | PRN

## 2019-10-26 MED ORDER — BISACODYL 10 MG RE SUPP: 10.0000 mg | Freq: Every day | RECTAL | Status: DC | PRN

## 2019-10-26 MED ORDER — LIDOCAINE 2% (20 MG/ML) 5 ML SYRINGE
INTRAMUSCULAR | Status: AC
  Filled 2019-10-26: qty 5

## 2019-10-26 MED ORDER — PROPOFOL 10 MG/ML IV BOLUS
INTRAVENOUS | Status: AC
  Filled 2019-10-26: qty 20

## 2019-10-26 MED ORDER — POTASSIUM CHLORIDE IN NACL 20-0.45 MEQ/L-% IV SOLN
INTRAVENOUS | Status: DC
  Filled 2019-10-26 (×2): qty 1000

## 2019-10-26 MED ORDER — ASPIRIN 81 MG PO CHEW
81.0000 mg | CHEWABLE_TABLET | Freq: Every day | ORAL | Status: DC
  Administered 2019-10-26 – 2019-10-27 (×2): 81 mg via ORAL
  Filled 2019-10-26 (×2): qty 1

## 2019-10-26 MED ORDER — HYDROMORPHONE HCL 1 MG/ML IJ SOLN: 0.5000 mg | INTRAMUSCULAR | Status: DC | PRN

## 2019-10-26 MED ORDER — EPHEDRINE SULFATE-NACL 50-0.9 MG/10ML-% IV SOSY
PREFILLED_SYRINGE | INTRAVENOUS | Status: DC | PRN
  Administered 2019-10-26 (×3): 5 mg via INTRAVENOUS

## 2019-10-26 MED ORDER — SODIUM CHLORIDE 0.9 % IV SOLN
2.0000 g | INTRAVENOUS | Status: AC
  Administered 2019-10-26: 2 g via INTRAVENOUS
  Filled 2019-10-26: qty 20

## 2019-10-26 MED ORDER — LIDOCAINE 2% (20 MG/ML) 5 ML SYRINGE
INTRAMUSCULAR | Status: DC | PRN
  Administered 2019-10-26: 50 mg via INTRAVENOUS

## 2019-10-26 MED ORDER — CLONIDINE HCL 0.1 MG/24HR TD PTWK: 0.1000 mg | MEDICATED_PATCH | TRANSDERMAL | Status: DC

## 2019-10-26 MED ORDER — CHLORHEXIDINE GLUCONATE CLOTH 2 % EX PADS
6.0000 | MEDICATED_PAD | Freq: Every day | CUTANEOUS | Status: DC
  Administered 2019-10-27: 6 via TOPICAL

## 2019-10-26 MED ORDER — PHENYLEPHRINE 40 MCG/ML (10ML) SYRINGE FOR IV PUSH (FOR BLOOD PRESSURE SUPPORT)
PREFILLED_SYRINGE | INTRAVENOUS | Status: DC | PRN
  Administered 2019-10-26 (×5): 80 ug via INTRAVENOUS

## 2019-10-26 MED ORDER — PROPOFOL 10 MG/ML IV BOLUS
INTRAVENOUS | Status: DC | PRN
  Administered 2019-10-26: 50 mg via INTRAVENOUS

## 2019-10-26 MED ORDER — ROCURONIUM BROMIDE 10 MG/ML (PF) SYRINGE
PREFILLED_SYRINGE | INTRAVENOUS | Status: DC | PRN
  Administered 2019-10-26: 40 mg via INTRAVENOUS

## 2019-10-26 MED ORDER — ONDANSETRON HCL 4 MG/2ML IJ SOLN
INTRAMUSCULAR | Status: AC
  Filled 2019-10-26: qty 2

## 2019-10-26 MED ORDER — ALLOPURINOL 100 MG PO TABS
100.0000 mg | ORAL_TABLET | Freq: Every day | ORAL | Status: DC
  Administered 2019-10-26 – 2019-10-27 (×2): 100 mg via ORAL
  Filled 2019-10-26 (×2): qty 1

## 2019-10-26 MED ORDER — EPHEDRINE 5 MG/ML INJ
INTRAVENOUS | Status: AC
  Filled 2019-10-26: qty 10

## 2019-10-26 SURGICAL SUPPLY — 35 items
BAG URINE DRAIN 2000ML AR STRL (UROLOGICAL SUPPLIES) ×3 IMPLANT
BAG URINE LEG 500ML (DRAIN) ×1 IMPLANT
BAG URO CATCHER STRL LF (MISCELLANEOUS) ×3 IMPLANT
BLADE SURG 15 STRL LF DISP TIS (BLADE) ×1 IMPLANT
BLADE SURG 15 STRL SS (BLADE) ×3
CATH FOLEY 2WAY SLVR  5CC 20FR (CATHETERS) ×3
CATH FOLEY 2WAY SLVR 30CC 22FR (CATHETERS) ×2 IMPLANT
CATH FOLEY 2WAY SLVR 5CC 20FR (CATHETERS) ×1 IMPLANT
CATH FOLEY 3WAY 30CC 22FR (CATHETERS) IMPLANT
CATH URET 5FR 28IN OPEN ENDED (CATHETERS) IMPLANT
COVER WAND RF STERILE (DRAPES) IMPLANT
ELECT REM PT RETURN 15FT ADLT (MISCELLANEOUS) ×1 IMPLANT
GAUZE SPONGE 4X4 12PLY STRL (GAUZE/BANDAGES/DRESSINGS) ×2 IMPLANT
GLOVE SURG SS PI 8.0 STRL IVOR (GLOVE) ×2 IMPLANT
GOWN STRL REUS W/TWL XL LVL3 (GOWN DISPOSABLE) ×6 IMPLANT
HOLDER FOLEY CATH W/STRAP (MISCELLANEOUS) IMPLANT
KIT TURNOVER KIT A (KITS) IMPLANT
LOOP CUT BIPOLAR 24F LRG (ELECTROSURGICAL) IMPLANT
MANIFOLD NEPTUNE II (INSTRUMENTS) ×3 IMPLANT
NEEDLE HYPO 22GX1.5 SAFETY (NEEDLE) IMPLANT
NS IRRIG 1000ML POUR BTL (IV SOLUTION) ×3 IMPLANT
PACK CYSTO (CUSTOM PROCEDURE TRAY) ×3 IMPLANT
PENCIL SMOKE EVACUATOR (MISCELLANEOUS) IMPLANT
PLUG CATH AND CAP STER (CATHETERS) IMPLANT
SPONGE DRAIN TRACH 4X4 STRL 2S (GAUZE/BANDAGES/DRESSINGS) ×3 IMPLANT
SUT ETHILON 2 0 PS N (SUTURE) ×3 IMPLANT
SYR 30ML LL (SYRINGE) ×2 IMPLANT
SYR TOOMEY IRRIG 70ML (MISCELLANEOUS) ×3
SYRINGE TOOMEY IRRIG 70ML (MISCELLANEOUS) IMPLANT
TAPE CLOTH SURG 4X10 WHT LF (GAUZE/BANDAGES/DRESSINGS) ×2 IMPLANT
TOWEL OR 17X26 10 PK STRL BLUE (TOWEL DISPOSABLE) ×1 IMPLANT
TUBING CONNECTING 10 (TUBING) ×2 IMPLANT
TUBING CONNECTING 10' (TUBING) ×1
TUBING UROLOGY SET (TUBING) ×3 IMPLANT
WATER STERILE IRR 3000ML UROMA (IV SOLUTION) ×3 IMPLANT

## 2019-10-26 NOTE — Op Note (Signed)
Procedure: Cystoscopy with insertion of suprapubic cystotomy and transurethral resection of the prostate.  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  Postop diagnosis: Same.  Surgeon: Dr. Irine Seal.  Anesthesia: General.  Specimen: Prostate chips.  Drains: 73 French Foley suprapubic tube and 22 French urethral Foley catheter.  EBL: Approximately 100 mL.  Complications: None.  Indications: The patient is an 79 year old male with urinary retention who was found on urodynamics to have a weak detrusor and on cystoscopy visual evidence of outlet obstruction.  After discussing the options he is elected undergo transurethral resection of the prostate with insertion of a suprapubic tube.  Procedure: He was given Rocephin and gentamicin.  A general anesthetic was induced.  He was placed in lithotomy position and fitted with PAS hose.  His mons was clipped and he was prepped with Betadine solution and draped in usual sterile fashion.  He was placed in Trendelenburg position and cystoscopy was performed using the 23 [place] scope and 30 degree lens.  Examination revealed a normal urethra.  The external sphincter was intact.  The prostatic urethra trilobar hyperplasia with obstruction and was approximately 3 cm in length.  Examination of bladder revealed mild trabeculation and some catheter irritation but no tumors or stones.  The ureteral orifices were unremarkable.  The bladder was left full and the cystoscope was removed.  A curved Lowsley tractor was passed per urethra and positioned with the tip up against the midline anterior abdominal wall approximately 2 to 3 cm superior to the pubic bone.  A 15 blade was then used to incise down on the tip of the Lowsley which was pushed up through the bladder and abdominal wall.  The jaws were open and a 27 French Foley catheter was grasped and the jaws and drawn back into the bladder.  The balloon was filled with 10 mL of  sterile fluid.  The jaws were opened and the Lowsley was removed.  Cystoscopic inspection revealed the balloon well within the bladder.  The balloon was pulled up against the anterior bladder wall and the suprapubic tube was secured to the skin with a 2-0 nylon suture.  The continuous-flow resectoscope sheath was then placed the aid of the visual obturator which was replaced with an Beatrix Fetters handle with a bipolar loop and a 30 degree lens.  The prostate was then resected beginning with the middle lobe which was resected from 5-7 o'clock down to the capsular fibers.  The floor the prostate was resected from bladder neck to apex.  The left lateral lobe was resected from bladder neck to apex down to the capsular fibers followed by the right lobe.  The bladder was evacuated free of chips and residual apical and anterior tissue was resected to complete the procedure.  The bladder was then evacuated free of the remaining chips and final hemostasis was achieved.  Final inspection revealed a widely patent prostatic channel with an intact external sphincter and intact ureteral orifices.  The suprapubic catheter balloon was still in good position and a 22 [place] three-way Foley catheter was placed with the aid of a catheter guide.  The balloon was filled with 30 mL of sterile fluid and irrigated with clear return.  Continuous irrigation was placed in the suprapubic catheter with the urethral catheter to a drainage bag.  A dressing was applied to the suprapubic tube site.  He was taken down from lithotomy position, his anesthetic was reversed and he was moved to recovery in stable condition.  There were no complications.

## 2019-10-26 NOTE — Discharge Instructions (Signed)
Transurethral Resection of the Prostate, Care After This sheet gives you information about how to care for yourself after your procedure. Your health care provider may also give you more specific instructions. If you have problems or questions, contact your health care provider. What can I expect after the procedure? After the procedure, it is common to have:  Mild pain in your lower abdomen.  Soreness or mild discomfort in your penis from having the catheter inserted during the procedure.  A feeling of urgency when you need to urinate.  A small amount of blood in your urine. You may notice some small blood clots in your urine. These are normal. Follow these instructions at home: Medicines  Take over-the-counter and prescription medicines only as told by your health care provider.  If you were prescribed an antibiotic medicine, take it as told by your health care provider. Do not stop taking the antibiotic even if you start to feel better.  Ask your health care provider if the medicine prescribed to you: ? Requires you to avoid driving or using heavy machinery. ? Can cause constipation. You may need to take actions to prevent or treat constipation, such as:  Take over-the-counter or prescription medicines.  Eat foods that are high in fiber, such as fresh fruits and vegetables, whole grains, and beans.  Limit foods that are high in fat and processed sugars, such as fried or sweet foods.  Do not drive for 24 hours if you were given a sedative during your procedure. Activity   Return to your normal activities as told by your health care provider. Ask your health care provider what activities are safe for you.  Do not lift anything that is heavier than 10 lb (4.5 kg), or the limit that you are told, for 3 weeks after the procedure or until your health care provider says that it is safe.  Avoid intense physical activity for as long as told by your health care provider.  Avoid  sitting for a long time without moving. Get up and move around one or more times every few hours. This helps to prevent blood clots. You may increase your physical activity gradually as you start to feel better. Lifestyle  Do not drink alcohol for as long as told by your health care provider. This is especially important if you are taking prescription pain medicines.  Do not engage in sexual activity until your health care provider says that you can do this. General instructions   Do not take baths, swim, or use a hot tub until your health care provider approves.  Drink enough fluid to keep your urine pale yellow.  Urinate as soon as you feel the need to. Do not try to hold your urine for long periods of time.  If your health care provider approves, you may take a stool softener for 2-3 weeks to prevent you from straining to have a bowel movement.  Wear compression stockings as told by your health care provider. These stockings help to prevent blood clots and reduce swelling in your legs.  Keep all follow-up visits as told by your health care provider. This is important. Contact a health care provider if you have:  Difficulty urinating.  A fever.  Pain that gets worse or does not improve with medicine.  Blood in your urine that does not go away after 1 week of resting and drinking more fluids.  Swelling in your penis or testicles. Get help right away if:  You are unable   to urinate.  You are having more blood clots in your urine instead of fewer.  You have: ? Large blood clots. ? A lot of blood in your urine. ? Pain in your back or lower abdomen. ? Pain or swelling in your legs. ? Chills and you are shaking. ? Difficulty breathing or shortness of breath. Summary  After the procedure, it is common to have a small amount of blood in your urine.  Avoid heavy lifting and intense physical activity for as long as told by your health care provider.  Urinate as soon as you  feel the need to. Do not try to hold your urine for long periods of time.  Keep all follow-up visits as told by your health care provider. This is important. This information is not intended to replace advice given to you by your health care provider. Make sure you discuss any questions you have with your health care provider. Document Revised: 11/18/2018 Document Reviewed: 04/29/2018 Elsevier Patient Education  [place] A suprapubic catheter is a flexible tube that is used to drain urine from the bladder into a collection bag outside the body. The catheter is inserted into the bladder through a small opening in the lower abdomen, above the pubic bone (suprapubic area) and a few inches below your belly button (navel). A tiny balloon filled with germ-free (sterile) water helps to keep the catheter in place. The collection bag must be emptied at least once a day and cleaned at least every other day. The collection bag can be put beside your bed at night and attached to your leg during the day. You may have a large collection bag to use at night and a smaller one to use during the day. Your suprapubic catheter may need to be changed every 4-6 weeks, or as often as recommended by your health care provider. Healing of the tract where the catheter is placed can take 6 weeks to 6 months. During that time, your health care provider may change your catheter. Once the tract is well healed, you or a caregiver will change your suprapubic catheter at home. What are the risks? This catheter is safe to use. However, problems can occur, including:  Blocked urine flow. This can occur if the catheter stops working, or if you have a blood clot in your bladder or in the catheter.  Irritation of the skin around the catheter.  Infection. This can happen if bacteria gets into your bladder. Supplies needed:  Two pairs of sterile gloves.  Paper towels.  Catheter.  Two  syringes.  Sterile water.  Sterile cleaning solution.  Lubricant.  Collection bags. How to change the catheter  1. Drink plenty of fluids during the hours before you change the catheter. 2. Wash your hands with soap and water. If soap and water are not available, use hand sanitizer. 3. Draw up sterile water into a syringe to have ready to fill the new catheter balloon. The amount will depend on the size of the balloon. 4. Have all of your supplies ready and close to you on a paper towel. 5. Lie on your back, sitting slightly upright so that you can see the catheter and opening. 6. Put on sterile gloves. 7. Clean the skin around the catheter opening using the sterile cleaning solution. 8. Remove the water from the balloon in the catheter using a syringe. 9. Slowly remove the catheter. If the catheter seems stuck, or if you have difficulty removing  it: ? Do not pull on it. ? Call your health care provider right away. 10. Place the old catheter on a paper towel to discard later. 11. Take off the used gloves, and put on a new pair. 12. Put lubricant on the end of the new catheter that will go into your bladder. 13. Clean the skin around the catheter opening using the sterile cleaning solution. 14. Gently slide the catheter through the opening in your abdomen and into the tract that leads to your bladder. 15. Wait for some urine to start flowing through the catheter. 16. When urine starts to flow through the catheter, attach the collection bag to the end of the catheter. Make sure the connection is tight. 17. Use a syringe to fill the catheter balloon with sterile water. Fill to the amount directed by your health care provider. 18. Remove the gloves and wash your hands with soap and water. How to care for the skin around the catheter Follow your health care provider's instructions on caring for your skin.  Use a clean washcloth and soapy water to clean the skin around your catheter every  day. Pat the area dry with a clean paper towel.  Do not pull on the catheter.  Do not use ointment or lotion on this area, unless told by your health care provider.  Check the skin around the catheter every day for signs of infection. Check for: ? Redness, swelling, or pain. ? Fluid or blood. ? Warmth. ? Pus or a bad smell. How to empty and clean the collection bag Empty the large collection bag every 8 hours. Empty the small collection bag when it is about ? full. Clean the collection bag every 2-3 days, or as often as told by your health care provider. To do this: 1. Wash your hands with soap and water. If soap and water are not available, use hand sanitizer. 2. Disconnect the bag from the catheter and immediately attach a new bag to the catheter. 3. Hold the used bag over the toilet or another container. 4. Turn the valve (spigot) at the bottom of the bag to empty the urine. Empty the used bag completely. ? Do not touch the opening of the spigot. ? Do not let the opening touch the toilet or container. 5. Close the spigot tightly when the bag is empty. 6. Clean the used bag in one of the following methods: ? According to the manufacturer's instructions. ? As told by your health care provider. 7. Let the bag dry completely. Put it in a clean plastic bag before storing it. General tips   Always wash your hands before and after caring for your catheter and collection bag. Use a mild, fragrance-free soap. If soap and water are not available, use hand sanitizer.  Clean the outside of the catheter with soap and water as often as told by your health care provider.  Always make sure there are no twists or kinks in the catheter tube.  Always make sure there are no leaks in the catheter or collection bag.  Always wear the leg bag below your knee.  Make sure the overnight drainage bag is always lower than the level of your bladder, but do not let it touch the floor. Before you go to  sleep, hang the bag inside a wastebasket that is covered by a clean plastic bag.  Drink enough fluid to keep your urine pale yellow.  Do not take baths, swim, or use a hot tub until  your health care provider approves. Ask your health care provider if you may take showers. Contact a heath care provider if:  You leak urine.  You have redness, swelling, or pain around your catheter.  You have fluid or blood coming from your catheter opening.  Your catheter opening feels warm to the touch.  You have pus or a bad smell coming from your catheter opening.  You have a fever or chills.  Your urine flow slows down.  Your urine becomes cloudy or smelly. Get help right away if:  Your catheter comes out.  You have: ? Nausea. ? Back pain. ? Difficulty changing your catheter. ? Blood in your urine. ? No urine flow for 1 hour. Summary  A suprapubic catheter is a flexible tube that is used to drain urine from the bladder into a collection bag outside the body.  Your suprapubic catheter may need to be changed every 4-6 weeks, or as recommended by your health care provider.  Follow instructions on how to change the catheter and how to empty and clean the collection bag.  Always wash your hands before and after caring for your catheter and collection bag. Drink enough fluid to keep your urine pale yellow.  Get help right away if you have difficulty changing your catheter or if there is blood in your urine. This information is not intended to replace advice given to you by your health care provider. Make sure you discuss any questions you have with your health care provider. Document Revised: 11/19/2018 Document Reviewed: 09/02/2018 Elsevier Patient Education  [place].

## 2019-10-26 NOTE — Anesthesia Preprocedure Evaluation (Addendum)
Anesthesia Evaluation  Patient identified by MRN, date of birth, ID band Patient awake    Reviewed: Allergy & Precautions, H&P , NPO status , Patient's Chart, lab work & pertinent test results  Airway Mallampati: II  TM Distance: >3 FB Neck ROM: Full    Dental no notable dental hx. (+) Teeth Intact, Dental Advisory Given   Pulmonary neg pulmonary ROS, former smoker,    Pulmonary exam normal breath sounds clear to auscultation       Cardiovascular Exercise Tolerance: Good + Past MI and +CHF   Rhythm:Regular Rate:Normal     Neuro/Psych Anxiety Depression negative neurological ROS     GI/Hepatic negative GI ROS, Neg liver ROS,   Endo/Other  negative endocrine ROS  Renal/GU negative Renal ROS  negative genitourinary   Musculoskeletal   Abdominal   Peds  Hematology negative hematology ROS (+)   Anesthesia Other Findings   Reproductive/Obstetrics negative OB ROS                            Anesthesia Physical Anesthesia Plan  ASA: III  Anesthesia Plan: General   Post-op Pain Management:    Induction: Intravenous  PONV Risk Score and Plan: 3 and Ondansetron, Dexamethasone and Treatment may vary due to age or medical condition  Airway Management Planned: Oral ETT  Additional Equipment:   Intra-op Plan:   Post-operative Plan: Extubation in OR  Informed Consent: I have reviewed the patients History and Physical, chart, labs and discussed the procedure including the risks, benefits and alternatives for the proposed anesthesia with the patient or authorized representative who has indicated his/her understanding and acceptance.     Dental advisory given  Plan Discussed with: CRNA  Anesthesia Plan Comments:         Anesthesia Quick Evaluation

## 2019-10-26 NOTE — Anesthesia Procedure Notes (Signed)
Procedure Name: Intubation Date/Time: 10/26/2019 9:58 AM Performed by: Claudia Desanctis, CRNA Pre-anesthesia Checklist: Patient identified, Emergency Drugs available, Suction available and Patient being monitored Patient Re-evaluated:Patient Re-evaluated prior to induction Oxygen Delivery Method: Circle system utilized Preoxygenation: Pre-oxygenation with 100% oxygen Induction Type: IV induction Ventilation: Mask ventilation without difficulty and Oral airway inserted - appropriate to patient size Laryngoscope Size: 2 and Miller Grade View: Grade I Tube type: Oral Tube size: 7.5 mm Number of attempts: 1 Airway Equipment and Method: Stylet Placement Confirmation: ETT inserted through vocal cords under direct vision,  positive ETCO2 and breath sounds checked- equal and bilateral Secured at: 22 cm Tube secured with: Tape Dental Injury: Teeth and Oropharynx as per pre-operative assessment

## 2019-10-26 NOTE — Progress Notes (Signed)
Post-op note  Subjective: The patient is doing well.  No complaints. No pain. Tolerating regular diet, no nausea, emesis  Objective: Vital signs in last 24 hours: Temp:  [97.4 F (36.3 C)-97.9 F (36.6 C)] 97.6 F (36.4 C) (03/16 1246) Pulse Rate:  [55-72] 71 (03/16 1246) Resp:  [12-16] 15 (03/16 1230) BP: (113-143)/(72-84) 118/84 (03/16 1246) SpO2:  [96 %-100 %] 97 % (03/16 1246) Weight:  [77.3 kg] 77.3 kg (03/16 0746)  Intake/Output from previous day: No intake/output data recorded. Intake/Output this shift: Total I/O In: 1109 [I.V.:900; IV Piggyback:209] Out: 400 [Urine:400]  Physical Exam:  General: Alert and oriented. Abdomen: Soft, Nondistended. GU: Urine clear light red, no clots, on very slow drip irrigation.   Lab Results: No results for input(s): HGB, HCT in the last 72 hours.  Assessment/Plan: POD#0 s/p TURP and SP tube placement  - Continue current plan of care.      LOS: 0 days   Riley Eaton 10/26/2019, 3:50 PM

## 2019-10-26 NOTE — Transfer of Care (Signed)
Immediate Anesthesia Transfer of Care Note  Patient: Riley Eaton  Procedure(s) Performed: TRANSURETHRAL RESECTION OF THE PROSTATE (TURP) (N/A ) INSERTION OF SUPRAPUBIC CATHETER (N/A )  Patient Location: PACU  Anesthesia Type:General  Level of Consciousness: awake and patient cooperative  Airway & Oxygen Therapy: Patient Spontanous Breathing and Patient connected to face mask  Post-op Assessment: Report given to RN and Post -op Vital signs reviewed and stable  Post vital signs: Reviewed and stable  Last Vitals:  Vitals Value Taken Time  BP 136/72 10/26/19 1103  Temp    Pulse 67 10/26/19 1104  Resp 12 10/26/19 1104  SpO2 100 % 10/26/19 1104  Vitals shown include unvalidated device data.  Last Pain:  Vitals:   10/26/19 0806  TempSrc:   PainSc: 8       Patients Stated Pain Goal: 7 ([id number])  Complications: No apparent anesthesia complications

## 2019-10-26 NOTE — H&P (Signed)
I have urinary retention.  HPI: Riley Eaton is a 79 year-old male established patient who is here for urinary retention.  His problem was diagnosed 07/26/2020. His urinary retention is being treated with foley catheter. Patient denies suprapubic tube, intemittent catheterization, flomax, hytrin, cardura, uroxatrol, rapaflo, avodart, and proscar.   Riley Eaton returns today in f/u from UDS for cystoscopy for further assessment of his retention. He didn't have much detrusor activity on the urodynamics study and had delayed sensation.    UDS results: Riley Eaton held a max capacity of approx. 860 mls. There was a loss of compliance with an end filling pressure of approx. 18 cmH20. He was very uncomfortable at max capacity, rating the discomfort a 6/10 on the pain scale. His 1st sensation was felt at 489 mls. The bladder was stable. He tried for a long time to void as filling was continued. He appeared to generate two weak contractions on top of the loss of compliance, but they were not well sustained. He dibbled approx. 3 mls during his attempt to void. He became so uncomfortable that the study had to be stopped. Mild trabeculation and some elevation of the bladder base was noted. No reflux was seen. A 14 fr foley (size he arrived with) was inserted post UDS. He will return march 3rd for UDS f/u, sooner if needed.    Riley Eaton returns this afternoon following a voiding trial this morning. He was unable to void and a foley catheter was placed with the return of about 922ml of urine. He was seen in consultation by Dr. Tresa Moore on 12/15 during a hospitalization where he was found to have retention with a PVR of 1.8l. He saw me early in 2020 with lower volume retention and was able to resume voiding on tamsulosin which he continues to take. He had finasteride added by Dr. Tresa Moore at the time of the consult. 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 He has some discomfort from the foley.     ALLERGIES: None   MEDICATIONS: Allopurinol 100 mg tablet  Finasteride 5 mg tablet  Metoprolol Tartrate 50 mg tablet  Tamsulosin Hcl 0.4 mg capsule 1 capsule PO Daily  Ascorbic Acid  Aspirin Ec 81 mg tablet, delayed release  Atorvastatin Calcium 40 mg tablet  Carvedilol 12.5 mg tablet  Clindamycin Hcl 150 mg capsule  Clonidine 0.1 mg/24 hour patch, transdermal weekly  Gabapentin 300 mg capsule  Lasix 40 mg tablet  Miralax  Olanzapine 5 mg tablet  Pantoprazole Sodium 40 mg tablet, delayed release  Senna-Docusate Sodium  Trazodone Hcl 50 mg tablet  Vitamin D3     GU PSH: Complex cystometrogram, w/ void pressure and urethral pressure profile studies, any technique - 10/01/2019 Complex Uroflow - 10/01/2019 Cystoscopy - 09/25/2018 Emg surf Electrd - 10/01/2019 Inject For cystogram - 10/01/2019 Intrabd voidng Press - 10/01/2019     NON-GU PSH: Back surgery Cholecystectomy (laparoscopic)     GU PMH: BPH w/LUTS - 08/30/2019 Urinary Retention, He failed the voiding trial today. I will send a culture and have him return in about 2 weeks for Urodynamics with f/u a week later for possible cystoscopy. I discussed the likely bladder injury related to the overdistention event. - 08/30/2019    NON-GU PMH: Arthritis Depression GERD Gout Myocardial Infarction Prediabetes Seizure disorder Stroke/TIA  FAMILY HISTORY: No Family History    SOCIAL HISTORY: Marital Status: Married Preferred Language: English; Race: White Current Smoking Status: Patient does not smoke anymore.   Tobacco Use Assessment Completed: Used Tobacco in last 30 days? Drinks 4+ caffeinated drinks per day.     Notes: 2 sons, 1 daughter    REVIEW OF SYSTEMS:    GU Review Male:   Patient denies frequent urination, hard to postpone urination, burning/  pain with urination, get up at night to urinate, leakage of urine, stream starts and stops, trouble starting your stream, have to strain to urinate , erection problems, and penile pain.  Gastrointestinal (Upper):   Patient denies nausea, vomiting, and indigestion/ heartburn.  Gastrointestinal (Lower):   Patient denies diarrhea and constipation.  Constitutional:   Patient denies weight loss, night sweats, fatigue, and fever.  Skin:   Patient denies skin rash/ lesion and itching.  Eyes:   Patient denies blurred vision and double vision.  Ears/ Nose/ Throat:   Patient denies sore throat and sinus problems.  Hematologic/Lymphatic:   Patient denies swollen glands and easy bruising.  Cardiovascular:   Patient denies leg swelling and chest pains.  Respiratory:   Patient denies cough and shortness of breath.  Endocrine:   Patient denies excessive thirst.  Musculoskeletal:   Patient denies back pain and joint pain.  Neurological:   Patient denies headaches and dizziness.  Psychologic:   Patient denies depression and anxiety.   Notes: catheter in place    VITAL SIGNS:      10/13/2019 02:58 PM  BP 129/80 mmHg  Pulse 73 /min  Temperature 97.5 F / 36.3 C   MULTI-SYSTEM PHYSICAL EXAMINATION:    Constitutional: Well-nourished. No physical deformities. Normally developed. Good grooming.  Respiratory: Normal breath sounds. No labored breathing, no use of accessory muscles.   Cardiovascular: Regular rate and rhythm. No murmur, no gallop.      PAST DATA REVIEWED:  Source Of History:  Patient  Lab Test Review:   BMP  Records Review:   Previous Hospital Records  Urodynamics Review:   Review Urodynamics Tests   PROCEDURES:         Flexible Cystoscopy - 52000  Risks, benefits, and some of the potential complications of the procedure were discussed. He is on amoxicilin for the procedure. The foley was removed and 41ml of 2% lidocaine jelly was instilled intraurethrally.     Meatus:  Normal size.  Normal location. Normal condition.  Urethra:  No strictures. There is a false passage in the posterior bulb that has epithelialized.   External Sphincter:  Normal.  Verumontanum:  Normal.  Prostate:  Obstructing. Enlarged median lobe. Moderate hyperplasia.  Bladder Neck:  Non-obstructing.  Ureteral Orifices:  Normal location. Normal size. Normal shape. Effluxed clear urine.  Bladder:  Mild trabeculation. No tumors. Normal mucosa. No stones.      The procedure was well tolerated and there were no complications.        Simple Foley Catheterization - [id number]  A 16 French Coude Foley catheter was inserted into the bladder using sterile technique. The patient was taught routine catheter care. A leg bag was connected.   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 I reviewd the risks of a TURP including bleeding, infection, incontinence, stricture, need for secondary procedures, ejaculatory and erectile dysfunction, thrombotic events, fluid overload and anesthetic complications. I explained that 95% of men will have relief of the obstructive symptoms and about 70% will have relief of the irritative symptoms. I also reviewed the additional risk of an SP tube including injury to abdominal structures. I will get him cleared by cardiology for the procedure.   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    PLAN:           Schedule Return Visit/Planned Activity: Next Available Appointment - Schedule Surgery

## 2019-10-26 NOTE — Anesthesia Postprocedure Evaluation (Signed)
Anesthesia Post Note  Patient: Riley Eaton  Procedure(s) Performed: TRANSURETHRAL RESECTION OF THE PROSTATE (TURP) (N/A ) INSERTION OF SUPRAPUBIC CATHETER (N/A )     Patient location during evaluation: PACU Anesthesia Type: General Level of consciousness: awake and alert Pain management: pain level controlled Vital Signs Assessment: post-procedure vital signs reviewed and stable Respiratory status: spontaneous breathing, nonlabored ventilation and respiratory function stable Cardiovascular status: blood pressure returned to baseline and stable Postop Assessment: no apparent nausea or vomiting Anesthetic complications: no    Last Vitals:  Vitals:   10/26/19 1215 10/26/19 1230  BP: 132/81 116/73  Pulse: (!) 55 65  Resp: 13 15  Temp:  (!) 36.3 C  SpO2: 99% 100%    Last Pain:  Vitals:   10/26/19 1230  TempSrc:   PainSc: 0-No pain                 Delila Kuklinski,W. EDMOND

## 2019-10-27 ENCOUNTER — Other Ambulatory Visit: Payer: Self-pay

## 2019-10-27 ENCOUNTER — Ambulatory Visit: Payer: Medicare PPO | Admitting: Cardiovascular Disease

## 2019-10-27 DIAGNOSIS — N401 Enlarged prostate with lower urinary tract symptoms: Secondary | ICD-10-CM | POA: Diagnosis not present

## 2019-10-27 DIAGNOSIS — N312 Flaccid neuropathic bladder, not elsewhere classified: Secondary | ICD-10-CM

## 2019-10-27 LAB — SURGICAL PATHOLOGY

## 2019-10-27 NOTE — Progress Notes (Signed)
Pt to be discharged to home this afternoon. Pt to be discharged with suprapubic catheter connected to leg bag. Pt given leg bag teaching and Pt voiced understanding and returned demonstration. All other discharge including Medications and schedules reviewed with Pt. Pt verbalized understanding of all discharge teaching/instructions. Discharge Packet with Pt at time of discharge

## 2019-10-27 NOTE — Progress Notes (Signed)
1 Day Post-Op  Subjective: Riley Eaton is doing well s/p TURP and SP tube placement.   His urine has cleared nicely.   He has no complaints.  ROS:  Review of Systems  All other systems reviewed and are negative.   Anti-infectives: Anti-infectives (From admission, onward)   Start     Dose/Rate Route Frequency Ordered Stop   10/26/19 0745  cefTRIAXone (ROCEPHIN) 2 g in sodium chloride 0.9 % 100 mL IVPB     2 g 200 mL/hr over 30 Minutes Intravenous 30 min pre-op 10/26/19 0745 10/26/19 1941   10/26/19 0600  gentamicin (GARAMYCIN) 360 mg in dextrose 5 % 100 mL IVPB     360 mg 109 mL/hr over 60 Minutes Intravenous  Once 10/25/19 0854 10/26/19 1020      Current Facility-Administered Medications  Medication Dose Route Frequency Provider Last Rate Last Admin  . 0.45 % NaCl with KCl 20 mEq / L infusion   Intravenous Continuous Irine Seal, MD 100 mL/hr at 10/26/19 1442 New Bag at 10/26/19 1442  . acetaminophen (TYLENOL) tablet 650 mg  650 mg Oral Q4H PRN Irine Seal, MD      . allopurinol (ZYLOPRIM) tablet 100 mg  100 mg Oral Daily Irine Seal, MD   100 mg at 10/26/19 1452  . aspirin chewable tablet 81 mg  81 mg Oral Daily Irine Seal, MD   81 mg at 10/26/19 1452  . atorvastatin (LIPITOR) tablet 40 mg  40 mg Oral q1800 Irine Seal, MD   40 mg at 10/26/19 1751  . bisacodyl (DULCOLAX) suppository 10 mg  10 mg Rectal Daily PRN Irine Seal, MD      . Chlorhexidine Gluconate Cloth 2 % PADS 6 each  6 each Topical Daily Irine Seal, MD      . Derrill Memo ON 10/28/2019] cloNIDine (CATAPRES - Dosed in mg/24 hr) patch 0.1 mg  0.1 mg Transdermal Q Sanda Klein, Jenny Reichmann, MD      . diphenhydrAMINE (BENADRYL) injection 12.5 mg  12.5 mg Intravenous Q6H PRN Irine Seal, MD       Or  . diphenhydrAMINE (BENADRYL) 12.5 MG/5ML elixir 12.5 mg  12.5 mg Oral Q6H PRN Irine Seal, MD      . ferrous sulfate tablet 325 mg  325 mg Oral BID Irine Seal, MD   325 mg at 10/26/19 2050  . finasteride (PROSCAR) tablet 5 mg  5 mg Oral  Daily Irine Seal, MD      . furosemide (LASIX) tablet 40 mg  40 mg Oral Daily Irine Seal, MD   40 mg at 10/26/19 1452  . gabapentin (NEURONTIN) capsule 600 mg  600 mg Oral TID Irine Seal, MD   600 mg at 10/26/19 2050  . HYDROmorphone (DILAUDID) injection 0.5-1 mg  0.5-1 mg Intravenous Q2H PRN Irine Seal, MD      . hyoscyamine (LEVSIN SL) SL tablet 0.125 mg  0.125 mg Sublingual Q4H PRN Irine Seal, MD      . OLANZapine [facility]) tablet 5 mg  5 mg Oral Daily Irine Seal, MD      . ondansetron [facility]) injection 4 mg  4 mg Intravenous Q4H PRN Irine Seal, MD      . oxyCODONE (Oxy IR/ROXICODONE) immediate release tablet 5 mg  5 mg Oral Q4H PRN Irine Seal, MD   5 mg at 10/27/19 0741  . pantoprazole (PROTONIX) EC tablet 40 mg  40 mg Oral QHS Irine Seal, MD   40 mg at 10/26/19 2050  . polyethylene glycol (  MIRALAX / GLYCOLAX) packet 17 g  17 g Oral Daily Irine Seal, MD      . senna-docusate (Senokot-S) tablet 1 tablet  1 tablet Oral QHS PRN Irine Seal, MD      . sodium chloride irrigation 0.9 % 3,000 mL  3,000 mL Irrigation Continuous Irine Seal, MD   Continued from Pre-op at 10/26/19 1330  . sodium phosphate (FLEET) 7-19 GM/118ML enema 1 enema  1 enema Rectal Once PRN Irine Seal, MD      . traZODone (DESYREL) tablet 50 mg  50 mg Oral QHS Irine Seal, MD   50 mg at 10/26/19 2050     Objective: Vital signs in last 24 hours: Temp:  [97.3 F (36.3 C)-98.6 F (37 C)] 98.6 F (37 C) (03/17 0603) Pulse Rate:  [55-77] 68 (03/17 0603) Resp:  [12-18] 18 (03/17 0603) BP: (105-143)/(57-84) 105/57 (03/17 0603) SpO2:  [97 %-100 %] 98 % (03/17 0603)  Intake/Output from previous day: 03/16 0701 - 03/17 0700 In: 3236.5 [I.V.:2027.5; IV Piggyback:209] Out: 5025 [Urine:5025] Intake/Output this shift: No intake/output data recorded.   Physical Exam Vitals reviewed.  Constitutional:      Appearance: Normal appearance.  Neurological:     Mental Status: He is alert.     Lab Results:  No  results for input(s): WBC, HGB, HCT, PLT in the last 72 hours. BMET No results for input(s): NA, K, CL, CO2, GLUCOSE, BUN, CREATININE, CALCIUM in the last 72 hours. PT/INR No results for input(s): LABPROT, INR in the last 72 hours. ABG No results for input(s): PHART, HCO3 in the last 72 hours.  Invalid input(s): PCO2, PO2  Studies/Results: No results found.   Assessment and Plan: BPH with retention and hypotonic bladder doing well post op.    D/C urethral catheter and plug sp tube for a voiding trial.       LOS: 0 days    Irine Seal 10/27/2019 [id number] ID: Teryl Lucy, male   DOB: 08/03/1941, 79 y.o.   MRN: [id number]

## 2019-10-27 NOTE — Progress Notes (Signed)
Pt ambulated in the hallway and once returned to room walked to the bathroom to try to urinate post foley catheter removal. Pt onlt able to dribble a small amount of bloody urine Pt with c/o pressure and fullness. Suprapubic catheter unclamped with 300 cc amber urine drained. Pt reports relief of fullness and Pt placed in chair for lunch.

## 2019-10-27 NOTE — Discharge Summary (Signed)
Physician Discharge Summary  Patient ID: Riley Eaton MRN: [id number] DOB/AGE: [birth year] 80 y.o.  Admit date: 10/26/2019 Discharge date: 10/27/2019  Admission Diagnoses:  BPH with urinary obstruction  Discharge Diagnoses:  Principal Problem:   BPH with urinary obstruction Active Problems:   Urinary retention   Hypotonic bladder   Past Medical History:  Diagnosis Date  . Anxiety   . CHF (congestive heart failure) (HCC)    not in last 2.5 months  . Chronic back pain   . 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 Chronic, continuous use of opioids    for back pain  . Coronary artery calcification seen on CT scan   . Depression   . Encephalopathy 07/14/2019  . Myocardial infarction ([place]) 07/2019  . Nerve pain   . Thoracic aortic aneurysm ([place])     Surgeries: Procedure(s): TRANSURETHRAL RESECTION OF THE PROSTATE (TURP) INSERTION OF SUPRAPUBIC CATHETER on 10/26/2019   Consultants (if any):   Discharged Condition: Improved  Hospital Course: Riley Eaton is an 79 y.o. male who was admitted 10/26/2019 with a diagnosis of BPH with urinary obstruction and went to the operating room on 10/26/2019 and underwent the above named procedures.  The foley was removed and the SP plugged in the AM of 3/17 but he was unable to void.  He will be sent home with the SP tube to leg bag drainage.   He was given perioperative antibiotics:  Anti-infectives (From admission, onward)   Start     Dose/Rate Route Frequency Ordered Stop   10/26/19 0745  cefTRIAXone (ROCEPHIN) 2 g in sodium chloride 0.9 % 100 mL IVPB     2 g 200 mL/hr over 30 Minutes Intravenous 30 min pre-op 10/26/19 0745 10/26/19 1941   10/26/19 0600  gentamicin (GARAMYCIN) 360 mg in dextrose 5 % 100 mL IVPB     360 mg 109 mL/hr over 60 Minutes Intravenous  Once 10/25/19 0854 10/26/19 1020    .  He was given sequential compression devices for DVT prophylaxis.  He benefited maximally from the hospital stay and there were no  complications.    Recent vital signs:  Vitals:   10/27/19 0926 10/27/19 1000  BP: 95/72   Pulse: 70   Resp: 18   Temp: (!) 97.3 F (36.3 C)   SpO2:  98%    Recent laboratory studies:  Lab Results  Component Value Date   HGB 11.4 (L) 10/20/2019   HGB 9.7 (L) 07/28/2019   HGB 10.9 (L) 07/27/2019   Lab Results  Component Value Date   WBC 4.1 10/20/2019   PLT 139 (L) 10/20/2019   No results found for: INR Lab Results  Component Value Date   NA 142 10/20/2019   K 3.9 10/20/2019   CL 105 10/20/2019   CO2 27 10/20/2019   BUN 27 (H) 10/20/2019   CREATININE 1.16 10/20/2019   GLUCOSE 102 (H) 10/20/2019    Discharge Medications:   Allergies as of 10/27/2019   No Known Allergies     Medication List    TAKE these medications   allopurinol 100 MG tablet Commonly known as: ZYLOPRIM TAKE 1 TABLET BY MOUTH EVERY DAY   aspirin 81 MG chewable tablet Chew 1 tablet (81 mg total) by mouth daily.   atorvastatin 40 MG tablet Commonly known as: LIPITOR Take 1 tablet (40 mg total) by mouth daily at 6 PM.   cloNIDine 0.1 mg/24hr patch Commonly known as: CATAPRES - Dosed in mg/24 hr Place 1 patch (  0.1 mg total) onto the skin once a week.   ferrous sulfate 325 (65 FE) MG EC tablet Take 325 mg by mouth 2 (two) times daily.   finasteride 5 MG tablet Commonly known as: PROSCAR Take 1 tablet (5 mg total) by mouth daily. For urination   furosemide 40 MG tablet Commonly known as: LASIX Take 1 tablet (40 mg total) by mouth daily.   gabapentin 300 MG capsule Commonly known as: NEURONTIN Take 600 mg by mouth 3 (three) times daily.   metoprolol tartrate 50 MG tablet Commonly known as: LOPRESSOR Take 1 tablet (50 mg total) by mouth once for 1 dose. 2 HOURS PRIOR TO CT   OLANZapine 5 MG tablet Commonly known as: ZYPREXA Take 1 tablet (5 mg total) by mouth daily.   pantoprazole 40 MG tablet Commonly known as: PROTONIX Take 1 tablet (40 mg total) by mouth at bedtime.    polyethylene glycol 17 g packet Commonly known as: MIRALAX / GLYCOLAX Take 17 g by mouth daily.   traZODone 50 MG tablet Commonly known as: DESYREL Take 50 mg by mouth at bedtime.       Diagnostic Studies: No results found.  Disposition: Discharge disposition: 01-Home or Self Care         Follow-up Information    Karen Kays, NP Follow up on 11/04/2019.   Specialty: Nurse Practitioner Why: 1:45 Contact information: [address] [place] [address] [phone]            Signed: Irine Seal 10/27/2019, 12:25 PM

## 2019-11-04 ENCOUNTER — Ambulatory Visit: Payer: Medicare PPO | Admitting: General Practice

## 2019-12-05 NOTE — Progress Notes (Signed)
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  Past Medical History    Past Medical History:  Diagnosis Date  . Anxiety   . CHF (congestive heart failure) (HCC)    not in last 2.5 months  . Chronic back pain   . 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 Chronic, continuous use of opioids    for back pain  . Coronary artery calcification seen on CT scan   . Depression   . Encephalopathy 07/14/2019  . Myocardial infarction ([place]) 07/2019  . Nerve pain   . Thoracic aortic aneurysm [facility])    Past Surgical History:  Procedure Laterality Date  . BACK SURGERY     09-2016  . back surgey    . ENDOSCOPIC RETROGRADE CHOLANGIOPANCREATOGRAPHY (ERCP) WITH PROPOFOL N/A 09/25/2018   Procedure: ENDOSCOPIC RETROGRADE CHOLANGIOPANCREATOGRAPHY (ERCP) WITH PROPOFOL;  Surgeon: Riley Juniper, Eaton;  Location: [facility];  Service: Gastroenterology;  Laterality: N/A;  . INSERTION OF SUPRAPUBIC CATHETER N/A 10/26/2019   Procedure: INSERTION OF SUPRAPUBIC CATHETER;  Surgeon: Riley Eaton;  Location: [facility];  Service: Urology;  Laterality: N/A;  . PANCREATIC STENT PLACEMENT  09/25/2018   Procedure: PANCREATIC STENT PLACEMENT;  Surgeon: Riley Juniper, Eaton;  Location: [facility] ENDOSCOPY;  Service: Gastroenterology;;  . REMOVAL OF STONES  09/25/2018   Procedure: REMOVAL OF STONES;  Surgeon: Riley Juniper, Eaton;  Location: [place];  Service: Gastroenterology;;  . Riley Eaton  09/25/2018   Procedure: Riley Eaton;  Surgeon: Riley Juniper, Eaton;  Location[facility];  Service: Gastroenterology;;  . TRANSURETHRAL RESECTION OF PROSTATE N/A 10/26/2019   Procedure: TRANSURETHRAL RESECTION OF THE PROSTATE (TURP);  Surgeon: Riley Eaton;   Location: [facility];  Service: Urology;  Laterality: N/A;    Allergies  No Known Allergies  History of Present Illness    [name] has a past medical history of thoracic aortic aneurysm, coronary artery disease, chronic back pain, chronic opioid use, dilated cardiac myopathy, AMS, pancreatic stent placement, and non-STEMI.  He was seen in hospital consultation on 07/13/2019 for elevated cardiac enzymes in the setting of altered mental status and cholelithiasis. Consult was requested by Dr. Benny Eaton. Eaton echocardiogram from 07/14/2019 showed an LVEF of 30 to 35%, LV grossly severely decreased function, mild LVH, and grade 1 diastolic dysfunction.Troponin peak 2110. He wasnot a candidate for cath due to AMS and acute renal failure. He wasstarted on losartan and spironolactone however, medications were DC'd due to his acute renal failure 07/27/2019.BUN 51 and Creat 3.77 at DC on 08/02/19 and trending down.  He was seen virtually 08/18/2019 for follow-up and statedhe  had some nauseousness since returning home from his rehab facility. He had been busy with activities of daily living but had not resumed his rehab exercises due to his nausea. He planed to resume his rehab exercises as soon as he was able. He stated overall he felt well and had only noticed mild indigestion that is relieved with belching and ambulation.  I  ordered a coronary CTA which was canceled due to his renal status.  The triage nurse line was contacted 09/28/2019 due to increased work of breathing and weight gain.  He was seen by Riley Eaton on 09/29/2019.  At that time he was prescribed  furosemide 40 mg daily.  His BMP 10/06/2019 showed a creatinine of 1.33 and a BUN of 31.  He was noticing increased dyspnea on exertion and orthopnea.  He denied chest pain.  He presented to the clinic 10/15/19 for follow-up and stated he was feeling much better after taking increased dose of Lasix.  He had been monitoring his diet and staying  away from sodium.  His activity was limited due to his back pain.  I  ordered a BMP , continued his furosemide 40 mg daily, and had him follow-up with me in 2 months.  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  He underwent TURP on 10/26/2019 his Foley was removed and he was unable to void.  He was sent home with SP tube to light drainage bag.  He presents the clinic today and states he feels well.  He does state that he does not really like but is tolerating his SP tube.  He hopes that it may be reversed in the future but realizes that it may not be.  He has been emptying his leg bag about every 4 hours.  He continues to be compliant with low-sodium diet and states he has been slowly increasing his physical activity.  I will give him salty 6 sheet, have him continue to weigh daily, and schedule follow-up for 3 months.  Today hedenies chest pain, shortness of breath, lower extremity edema, fatigue, palpitations, melena, hematuria, hemoptysis, diaphoresis, weakness, presyncope, syncope, orthopnea, and PND.  Home Medications    Prior to Admission medications   Medication Sig Start Date End Date Taking? Authorizing Provider  allopurinol (ZYLOPRIM) 100 MG tablet TAKE 1 TABLET BY MOUTH EVERY DAY Patient taking differently: Take 100 mg by mouth daily.  03/16/19   Riley Ducking, Eaton  aspirin 81 MG chewable tablet Chew 1 tablet (81 mg total) by mouth daily. 08/02/19   Eaton, Riley Eaton  atorvastatin (LIPITOR) 40 MG tablet Take 1 tablet (40 mg total) by mouth daily at 6 PM. 08/02/19   Eaton, Riley Eaton  cloNIDine (CATAPRES - DOSED IN MG/24 HR) 0.1 mg/24hr patch Place 1 patch (0.1 mg total) onto the skin once a week. 08/02/19   Eaton, Riley Eaton  ferrous sulfate 325 (65 FE) MG EC tablet Take 325 mg by mouth 2 (two) times daily. 08/30/19   Provider, Historical, Eaton  finasteride (PROSCAR) 5 MG tablet Take 1 tablet (5 mg total) by mouth daily. For urination  07/27/19   Riley Frock, Eaton  furosemide (LASIX) 40 MG tablet Take 1 tablet (40 mg total) by mouth daily. 09/29/19 12/28/19  Lorretta Harp, Eaton  gabapentin (NEURONTIN) 300 MG capsule Take 600 mg by mouth 3 (three) times daily. 10/13/19   Provider, Historical, Eaton  metoprolol tartrate (LOPRESSOR) 50 MG tablet Take 1 tablet (50 mg total) by mouth once for 1 dose. 2 HOURS PRIOR TO CT Patient not taking: Reported on 10/19/2019 08/18/19 09/29/19  Deberah Pelton, NP  OLANZapine (ZYPREXA) 5 MG tablet Take 1 tablet (5 mg total) by mouth daily. 08/02/19   Eaton, Riley Eaton  pantoprazole (PROTONIX) 40 MG tablet Take 1 tablet (40 mg total) by mouth at bedtime. 08/02/19   Eaton, Riley Eaton  polyethylene glycol (MIRALAX / GLYCOLAX) 17 g packet Take 17 g by mouth daily. 08/02/19   Eaton, Riley Eaton  traZODone (DESYREL) 50 MG tablet Take 50 mg by mouth at bedtime. 05/17/19   Provider, Historical, Eaton  Family History    Family History  Problem Relation Age of Onset  . Cancer Mother   . Heart attack Father    He indicated that his mother is deceased. He indicated that his father is deceased. He indicated that both of his sisters are alive.  Social History    Social History   Socioeconomic History  . Marital status: Married    Spouse name: Not on file  . Number of children: 3  . Years of education: 62  . Highest education level: Not on file  Occupational History  . Not on file  Tobacco Use  . Smoking status: Former Research scientist (life sciences)  . Smokeless tobacco: Former Systems developer    Quit date: 12/12/1979  . Tobacco comment: quit 45 years ago  Substance and Sexual Activity  . Alcohol use: No  . Drug use: No  . Sexual activity: Not on file  Other Topics Concern  . Not on file  Social History Narrative   Lives with wife   Caffeine use: Drinks 6 cups coffee per day   Right handed    Social Determinants of Health   Financial Resource Strain:   . Difficulty of Paying Living Expenses:   Food Insecurity:   . Worried About  Charity fundraiser in the Last Year:   . Arboriculturist in the Last Year:   Transportation Needs:   . Film/video editor (Medical):   [name] Lack of Transportation (Non-Medical):   Physical Activity:   . Days of Exercise per Week:   . Minutes of Exercise per Session:   Stress:   . Feeling of Stress :   Social Connections:   . Frequency of Communication with Friends and Family:   . Frequency of Social Gatherings with Friends and Family:   . Attends Religious Services:   . Active Member of Clubs or Organizations:   . Attends Archivist Meetings:   [name] Marital Status:   Intimate Partner Violence:   . Fear of Current or Ex-Partner:   . Emotionally Abused:   [name] Physically Abused:   . Sexually Abused:      Review of Systems    General:  No chills, fever, night sweats or weight changes.  Cardiovascular:  No chest pain, dyspnea on exertion, edema, orthopnea, palpitations, paroxysmal nocturnal dyspnea. Dermatological: No rash, lesions/masses Respiratory: No cough, dyspnea Urologic: No hematuria, dysuria Abdominal:   No nausea, vomiting, diarrhea, bright red blood per rectum, melena, or hematemesis Neurologic:  No visual changes, wkns, changes in mental status. All other systems reviewed and are otherwise negative except as noted above.  Physical Exam    VS:  BP 120/78   Pulse 72   Temp (!) 97.2 F (36.2 C)   Ht 5\' 7"  (1.702 m)   Wt 166 lb 12.8 oz (75.7 kg)   SpO2 97%   BMI 26.12 kg/m  , BMI Body mass index is 26.12 kg/m. GEN: Well nourished, well developed, in no acute distress. HEENT: normal. Neck: Supple, no JVD, carotid bruits, or masses. Cardiac: RRR, no murmurs, rubs, or gallops. No clubbing, cyanosis, edema.  Radials/DP/PT 2+ and equal bilaterally.  Respiratory:  Respirations regular and unlabored, clear to auscultation bilaterally. GI: Soft, nontender, nondistended, BS + x 4. MS: no deformity or atrophy. Skin: warm and dry, no rash. Neuro:  Strength and  sensation are intact. Psych: Normal affect.  Accessory Clinical Findings    ECG personally reviewed by me today-none today.  EKG 10/15/2019 Normal sinus  rhythm 76 bpm no ST or T wave deviation  EKG 07/24/2019 Sinus rhythm with PVCs ST and T wave abnormalities consider lateral ischemia, QT prolongation 100 bpm  Echocardiogram 07/14/2019 IMPRESSIONS   1. Technically difficult study. Left ventricular ejection fraction, by visual estimation, is 30 to 35%. The left ventricle has grossly severely decreased function. Images are not sufficient to assess for regional wall motion abnormalities. There is  mildly increased left ventricular hypertrophy. 2. Left ventricular diastolic parameters are consistent with Grade I diastolic dysfunction (impaired relaxation). 3. Global right ventricle has normal systolic function.The right ventricular size is normal. 4. The mitral valve is normal in structure. No evidence of mitral valve regurgitation. 5. The tricuspid valve is not well visualized. Tricuspid valve regurgitation is not demonstrated. 6. The aortic valve was not well visualized. Aortic valve regurgitation is not visualized. 7. The pulmonic valve was not well visualized. Pulmonic valve regurgitation is not visualized.  Nuclear stress stress 10/20/2015  The left ventricular ejection fraction is normal (55-65%).  Nuclear stress EF: 57%.  There was no ST segment deviation noted during stress.  The study is normal.  This is a low risk study.  Normal pharmacologic nuclear stress test with no prior infarct and no ischemia.  Assessment & Plan   1.  Dilated cardiomyopathy-no increased work of breathing today and euvolemic.  Echocardiogram 07/14/2019 showed an LVEF of 30-35% with mild LV H and grade 1 diastolic dysfunction.07/27/2019 losartan and spironolactone DC'd due to acute kidney injury.  Furosemide 40 mg daily started on 09/28/2018 for increased work of breathing/oxygen  desaturation/weight gain. Continue furosemide 40 mg daily Continue carvedilol 12.5 mg twice daily Continue clonidine patch 0.1 mg 1 to skin once a week. Heart healthy low-sodium diet-reviewed and salty 6 given Continue Daily weights    NSTEMI-no chest pain today.  07/16/2019 EKG showed deep T wave inversion in inferior and anterolateral leads. 07/24/2019 EKG showed sinus rhythm with PVCs ST and T wave abnormalities consider lateral ischemia, QT prolongation 100 bpm. At bedtime troponin peaked at 2110 (07/15/2019). Echocardiogram 07/14/2019 showed an LVEF of 30-35% with mild LV H and grade 1 diastolic dysfunction. Unable to undergo cath during 12/20 hospital admission due to AMS, sepsis, and AKI.   Acute kidney injury-creatinine 07/24/2019 0.74, BUN 15. On 07/30/2019 creatinine peaked at 4.36, BUN 57. At discharge 08/02/2019 creatinine 3.77, BUN 51.  Follow-up BMP 10/06/2019 showed creatinine 1.33 with BUN 31.  Creatinine on 10/20/2019 was 1.16 and BUN was 27.  Followed by PCP  Disposition: Follow-up with Riley Eaton in 3 months.  Jossie Ng. Shaquasia Caponigro NP-C    12/06/2019, 2:13 PM [place] [address] Office [phone] Fax [phone]

## 2019-12-06 ENCOUNTER — Encounter: Payer: Self-pay | Admitting: General Practice

## 2019-12-06 ENCOUNTER — Ambulatory Visit: Payer: Medicare PPO | Admitting: General Practice

## 2019-12-06 ENCOUNTER — Other Ambulatory Visit: Payer: Self-pay

## 2019-12-06 VITALS — BP 120/78 | HR 72 | Temp 97.2°F | Ht 67.0 in | Wt 166.8 lb

## 2019-12-06 DIAGNOSIS — I214 Non-ST elevation (NSTEMI) myocardial infarction: Secondary | ICD-10-CM | POA: Diagnosis not present

## 2019-12-06 DIAGNOSIS — I420 Dilated cardiomyopathy: Secondary | ICD-10-CM | POA: Diagnosis not present

## 2019-12-06 DIAGNOSIS — N179 Acute kidney failure, unspecified: Secondary | ICD-10-CM

## 2019-12-06 NOTE — Patient Instructions (Signed)
Medication Instructions:  The current medical regimen is effective;  continue present plan and medications as directed. Please refer to the Current Medication list given to you today. *If you need a refill on your cardiac medications before your next appointment, please call your pharmacy*  Special Instructions PLEASE INCREASE PHYSICAL ACTIVITY AS TOLERATED  PLEASE READ AND FOLLOW SALTY 6-ATTACHED  Follow-Up: Your next appointment:  3 month(s)  In Person with Quay Burow, MD  At [facility], you and your health needs are our priority.  As part of our continuing mission to provide you with exceptional heart care, we have created designated Provider Care Teams.  These Care Teams include your primary Cardiologist (physician) and Advanced Practice Providers (APPs -  Physician Assistants and Nurse Practitioners) who all work together to provide you with the care you need, when you need it.  We recommend signing up for the patient portal called "MyChart".  Sign up information is provided on this After Visit Summary.  MyChart is used to connect with patients for Virtual Visits (Telemedicine).  Patients are able to view lab/test results, encounter notes, upcoming appointments, etc.  Non-urgent messages can be sent to your provider as well.   To learn more about what you can do with MyChart, go to [email or web address].

## 2020-03-03 ENCOUNTER — Encounter: Payer: Self-pay | Admitting: Cardiovascular Disease

## 2020-03-03 ENCOUNTER — Other Ambulatory Visit: Payer: Self-pay

## 2020-03-03 ENCOUNTER — Ambulatory Visit: Payer: Medicare PPO | Admitting: Cardiovascular Disease

## 2020-03-03 DIAGNOSIS — I712 Thoracic aortic aneurysm, without rupture, unspecified: Secondary | ICD-10-CM

## 2020-03-03 DIAGNOSIS — E782 Mixed hyperlipidemia: Secondary | ICD-10-CM

## 2020-03-03 DIAGNOSIS — I420 Dilated cardiomyopathy: Secondary | ICD-10-CM

## 2020-03-03 DIAGNOSIS — I2510 Atherosclerotic heart disease of native coronary artery without angina pectoris: Secondary | ICD-10-CM | POA: Diagnosis not present

## 2020-03-03 NOTE — Assessment & Plan Note (Signed)
Recent 2D echo performed 07/14/2019 did not show dilatation of the ascending thoracic aorta.

## 2020-03-03 NOTE — Assessment & Plan Note (Signed)
Calcification seen on chest CT but the patient denies chest pain.

## 2020-03-03 NOTE — Assessment & Plan Note (Signed)
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  The patient is asymptomatic.  He was not started on Entresto or an ACE inhibitor because of renal insufficiency.  He is not on a beta-blocker either and has a heart rate in the 70s.

## 2020-03-03 NOTE — Patient Instructions (Addendum)
Medication Instructions:  NO CHANGE *If you need a refill on your cardiac medications before your next appointment, please call your pharmacy*   Lab Work: If you have labs (blood work) drawn today and your tests are completely normal, you will receive your results only by: [name] MyChart Message (if you have MyChart) OR . A paper copy in the mail If you have any lab test that is abnormal or we need to change your treatment, we will call you to review the results.  Testing:  Your physician has requested that you have an echocardiogram. Echocardiography is a painless test that uses sound waves to create images of your heart. It provides your doctor with information about the size and shape of your heart and how well your heart's chambers and valves are working. This procedure takes approximately one hour. There are no restrictions for this procedure.[place]   Follow-Up: At [facility], you and your health needs are our priority.  As part of our continuing mission to provide you with exceptional heart care, we have created designated Provider Care Teams.  These Care Teams include your primary Cardiologist (physician) and Advanced Practice Providers (APPs -  Physician Assistants and Nurse Practitioners) who all work together to provide you with the care you need, when you need it.  We recommend signing up for the patient portal called "MyChart".  Sign up information is provided on this After Visit Summary.  MyChart is used to connect with patients for Virtual Visits (Telemedicine).  Patients are able to view lab/test results, encounter notes, upcoming appointments, etc.  Non-urgent messages can be sent to your provider as well.   To learn more about what you can do with MyChart, go to [email or web address].    Your next appointment:   6 month(s)  The format for your next appointment:   In Person  Provider:   You may see Quay Burow, MD or one of the following Advanced  Practice Providers on your designated Care Team:    Kerin Ransom, PA-C  [place], [place]  Coletta Memos, [place]

## 2020-03-03 NOTE — Assessment & Plan Note (Signed)
History of hyperlipidemia on statin therapy with lipid profile performed 09/03/2019 revealing a total cholesterol 103.

## 2020-03-03 NOTE — Progress Notes (Signed)
03/03/2020 Riley Eaton   11/03/1940  [id number]  Primary Physician Aletha Halim., PA-C Primary Cardiologist: Lorretta Harp MD FACP, [place], [place], [place]  HPI:  Riley Eaton is a 79 y.o.    mildly overweight married Caucasian male father of 26, grandfather of 4 grandchildren. Retired from being in the [employer]. He is referred by Leonia Reader PA-C at [facility] for cardiovascular evaluation because of an episode of shortness of breath, and an incidentally noted thoracic aortic aneurysm.I last saw him in the office  09/29/2019.Riley KitchenHe basically has no cardiac risk factors. He's never had a heart attack or stroke. He denies chest pain or shortness of breath. A Myoview stress test was performed 10/20/15 which was entirely normal. A CT scan revealed a thoracic aortic aneurysm measuring 4.1 cm.  He  was admitted in early December for altered mental status. He did have a troponin of 2100 and EKG changes. 2D echo revealed EF of 30% which is new compared to the EF on Myoview stress test performed 10/20/2015 which was normal. He was not a candidate for cardiac catheterization because of severe renal insufficiency nor he is a candidate for coronary CTA because of this as well. He gets occasional although infrequent chest pain. 2D echo did not comment on thoracic aortic aneurysm.  Since I saw him a month ago he is noticed increasing dyspnea on exertion and orthopnea.  He denies chest pain.  He was scheduled for coronary CTA but this will be canceled because of his severe renal insufficiency.  He underwent TURP by Dr. Jeffie Pollock 10/26/2019 and since that time his renal function has normalized.  He denies chest pain or shortness of breath.  His echo performed 07/14/2019 did not mention dilatation of his ascending thoracic aorta.   No outpatient medications have been marked as taking for the 03/03/20 encounter (Office Visit) with Lorretta Harp, MD.     No Known Allergies  Social  History   Socioeconomic History  . Marital status: Married    Spouse name: Not on file  . Number of children: 3  . Years of education: 62  . Highest education level: Not on file  Occupational History  . Not on file  Tobacco Use  . Smoking status: Former Research scientist (life sciences)  . Smokeless tobacco: Former Systems developer    Quit date: 12/12/1979  . Tobacco comment: quit 45 years ago  Vaping Use  . Vaping Use: Never used  Substance and Sexual Activity  . Alcohol use: No  . Drug use: No  . Sexual activity: Not on file  Other Topics Concern  . Not on file  Social History Narrative   Lives with wife   Caffeine use: Drinks 6 cups coffee per day   Right handed    Social Determinants of Health   Financial Resource Strain:   . Difficulty of Paying Living Expenses:   Food Insecurity:   . Worried About Charity fundraiser in the Last Year:   . Arboriculturist in the Last Year:   Transportation Needs:   . Film/video editor (Medical):   Riley Eaton Lack of Transportation (Non-Medical):   Physical Activity:   . Days of Exercise per Week:   . Minutes of Exercise per Session:   Stress:   . Feeling of Stress :   Social Connections:   . Frequency of Communication with Friends and Family:   . Frequency of Social Gatherings with Friends and Family:   . Attends  Religious Services:   . Active Member of Clubs or Organizations:   . Attends Archivist Meetings:   Riley Eaton Marital Status:   Intimate Partner Violence:   . Fear of Current or Ex-Partner:   . Emotionally Abused:   Riley Eaton Physically Abused:   . Sexually Abused:      Review of Systems: General: negative for chills, fever, night sweats or weight changes.  Cardiovascular: negative for chest pain, dyspnea on exertion, edema, orthopnea, palpitations, paroxysmal nocturnal dyspnea or shortness of breath Dermatological: negative for rash Respiratory: negative for cough or wheezing Urologic: negative for hematuria Abdominal: negative for nausea, vomiting,  diarrhea, bright red blood per rectum, melena, or hematemesis Neurologic: negative for visual changes, syncope, or dizziness All other systems reviewed and are otherwise negative except as noted above.    Blood pressure (!) 102/58, pulse 70, height 5\' 7"  (1.702 m), weight 170 lb 9.6 oz (77.4 kg), SpO2 96 %.  General appearance: alert and no distress Neck: no adenopathy, no carotid bruit, no JVD, supple, symmetrical, trachea midline and thyroid not enlarged, symmetric, no tenderness/mass/nodules Lungs: clear to auscultation bilaterally Heart: regular rate and rhythm, S1, S2 normal, no murmur, click, rub or gallop Extremities: extremities normal, atraumatic, no cyanosis or edema Pulses: 2+ and symmetric Skin: Skin color, texture, turgor normal. No rashes or lesions Neurologic: Alert and oriented X 3, normal strength and tone. Normal symmetric reflexes. Normal coordination and gait  EKG not performed today  ASSESSMENT AND PLAN:   Thoracic aortic aneurysm ([place]) Recent 2D echo performed 07/14/2019 did not show dilatation of the ascending thoracic aorta.  Coronary artery calcification seen on CT scan Calcification seen on chest CT but the patient denies chest pain.  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  The patient is asymptomatic.  He was not started on Entresto or an ACE inhibitor because of renal insufficiency.  He is not on a beta-blocker either and has a heart rate in the 70s.  Hyperlipidemia History of hyperlipidemia on statin therapy with lipid profile performed 09/03/2019 revealing a total cholesterol 103.      Lorretta Harp MD FACP,FACC,FAHA, [facility] 03/03/2020 11:54 AM

## 2020-03-20 ENCOUNTER — Ambulatory Visit (HOSPITAL_COMMUNITY): Payer: Medicare PPO | Attending: Cardiology

## 2020-03-20 ENCOUNTER — Other Ambulatory Visit: Payer: Self-pay

## 2020-03-20 DIAGNOSIS — I420 Dilated cardiomyopathy: Secondary | ICD-10-CM | POA: Insufficient documentation

## 2020-03-20 LAB — ECHOCARDIOGRAM COMPLETE
Area-P 1/2: 1.66 cm2
S' Lateral: 3.3 cm

## 2020-06-06 ENCOUNTER — Encounter: Payer: Self-pay | Admitting: Hematology & Oncology

## 2020-06-06 ENCOUNTER — Inpatient Hospital Stay: Payer: Medicare PPO | Attending: Hematology & Oncology | Admitting: Hematology & Oncology

## 2020-06-06 ENCOUNTER — Encounter: Payer: Self-pay | Admitting: *Deleted

## 2020-06-06 ENCOUNTER — Encounter (HOSPITAL_COMMUNITY): Payer: Self-pay

## 2020-06-06 ENCOUNTER — Ambulatory Visit (HOSPITAL_BASED_OUTPATIENT_CLINIC_OR_DEPARTMENT_OTHER)
Admission: RE | Admit: 2020-06-06 | Discharge: 2020-06-06 | Disposition: A | Discharge status: Home / Self Care | Payer: Medicare PPO | Source: Ambulatory Visit | Attending: Hematology & Oncology | Admitting: Hematology & Oncology

## 2020-06-06 ENCOUNTER — Other Ambulatory Visit: Payer: Self-pay

## 2020-06-06 ENCOUNTER — Inpatient Hospital Stay: Payer: Medicare PPO

## 2020-06-06 ENCOUNTER — Ambulatory Visit (HOSPITAL_COMMUNITY)
Admission: RE | Admit: 2020-06-06 | Discharge: 2020-06-06 | Disposition: A | Discharge status: Home / Self Care | Payer: Medicare PPO | Source: Ambulatory Visit | Attending: Hematology & Oncology | Admitting: Hematology & Oncology

## 2020-06-06 VITALS — BP 105/62 | HR 70 | Temp 98.2°F | Resp 18 | Ht 67.0 in | Wt 176.5 lb

## 2020-06-06 DIAGNOSIS — I509 Heart failure, unspecified: Secondary | ICD-10-CM | POA: Diagnosis not present

## 2020-06-06 DIAGNOSIS — D8989 Other specified disorders involving the immune mechanism, not elsewhere classified: Secondary | ICD-10-CM

## 2020-06-06 DIAGNOSIS — Z87891 Personal history of nicotine dependence: Secondary | ICD-10-CM | POA: Insufficient documentation

## 2020-06-06 DIAGNOSIS — D631 Anemia in chronic kidney disease: Secondary | ICD-10-CM | POA: Diagnosis not present

## 2020-06-06 DIAGNOSIS — R779 Abnormality of plasma protein, unspecified: Secondary | ICD-10-CM | POA: Insufficient documentation

## 2020-06-06 DIAGNOSIS — I429 Cardiomyopathy, unspecified: Secondary | ICD-10-CM | POA: Diagnosis not present

## 2020-06-06 DIAGNOSIS — N189 Chronic kidney disease, unspecified: Secondary | ICD-10-CM | POA: Diagnosis not present

## 2020-06-06 DIAGNOSIS — I712 Thoracic aortic aneurysm, without rupture: Secondary | ICD-10-CM | POA: Insufficient documentation

## 2020-06-06 DIAGNOSIS — I252 Old myocardial infarction: Secondary | ICD-10-CM | POA: Diagnosis not present

## 2020-06-06 DIAGNOSIS — F419 Anxiety disorder, unspecified: Secondary | ICD-10-CM | POA: Insufficient documentation

## 2020-06-06 LAB — CMP (CANCER CENTER ONLY)
ALT: 8 U/L (ref 0–44)
AST: 18 U/L (ref 15–41)
Albumin: 4.2 g/dL (ref 3.5–5.0)
Alkaline Phosphatase: 84 U/L (ref 38–126)
Anion gap: 7 (ref 5–15)
BUN: 29 mg/dL — ABNORMAL HIGH (ref 8–23)
CO2: 29 mmol/L (ref 22–32)
Calcium: 9.5 mg/dL (ref 8.9–10.3)
Chloride: 104 mmol/L (ref 98–111)
Creatinine: 1.38 mg/dL — ABNORMAL HIGH (ref 0.61–1.24)
GFR, Estimated: 52 mL/min — ABNORMAL LOW (ref >60)
Glucose, Bld: 127 mg/dL — ABNORMAL HIGH (ref 70–99)
Potassium: 4.1 mmol/L (ref 3.5–5.1)
Sodium: 140 mmol/L (ref 135–145)
Total Bilirubin: 0.8 mg/dL (ref 0.3–1.2)
Total Protein: 7 g/dL (ref 6.5–8.1)

## 2020-06-06 LAB — CBC WITH DIFFERENTIAL (CANCER CENTER ONLY)
Abs Immature Granulocytes: 0.03 10*3/uL (ref 0.00–0.07)
Basophils Absolute: 0 10*3/uL (ref 0.0–0.1)
Basophils Relative: 1 %
Eosinophils Absolute: 0.1 10*3/uL (ref 0.0–0.5)
Eosinophils Relative: 2 %
HCT: 34.8 % — ABNORMAL LOW (ref 39.0–52.0)
Hemoglobin: 12.1 g/dL — ABNORMAL LOW (ref 13.0–17.0)
Immature Granulocytes: 1 %
Lymphocytes Relative: 27 %
Lymphs Abs: 1.4 10*3/uL (ref 0.7–4.0)
MCH: 32.8 pg (ref 26.0–34.0)
MCHC: 34.8 g/dL (ref 30.0–36.0)
MCV: 94.3 fL (ref 80.0–100.0)
Monocytes Absolute: 0.3 10*3/uL (ref 0.1–1.0)
Monocytes Relative: 6 %
Neutro Abs: 3.2 10*3/uL (ref 1.7–7.7)
Neutrophils Relative %: 63 %
Platelet Count: 128 10*3/uL — ABNORMAL LOW (ref 150–400)
RBC: 3.69 MIL/uL — ABNORMAL LOW (ref 4.22–5.81)
RDW: 12.6 % (ref 11.5–15.5)
WBC Count: 5 10*3/uL (ref 4.0–10.5)
nRBC: 0 % (ref 0.0–0.2)

## 2020-06-06 LAB — SAVE SMEAR(SSMR), FOR PROVIDER SLIDE REVIEW

## 2020-06-06 LAB — LACTATE DEHYDROGENASE: LDH: 199 U/L — ABNORMAL HIGH (ref 98–192)

## 2020-06-06 IMAGING — DX DG BONE SURVEY MET
8 of 10 series · 8 of 10 positions shown · non-contrast
Comparison: CT [DATE].

CLINICAL DATA: M spike.  Question myeloma.

EXAM:
METASTATIC BONE SURVEY

[chest pa]
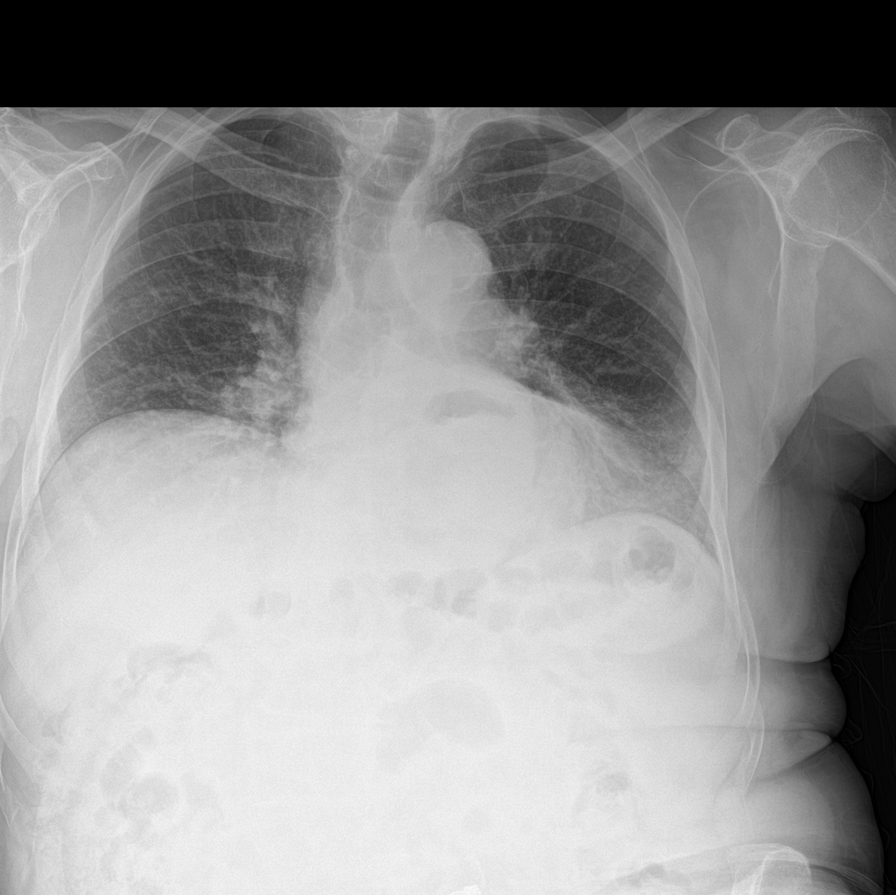

[c-spine lat]
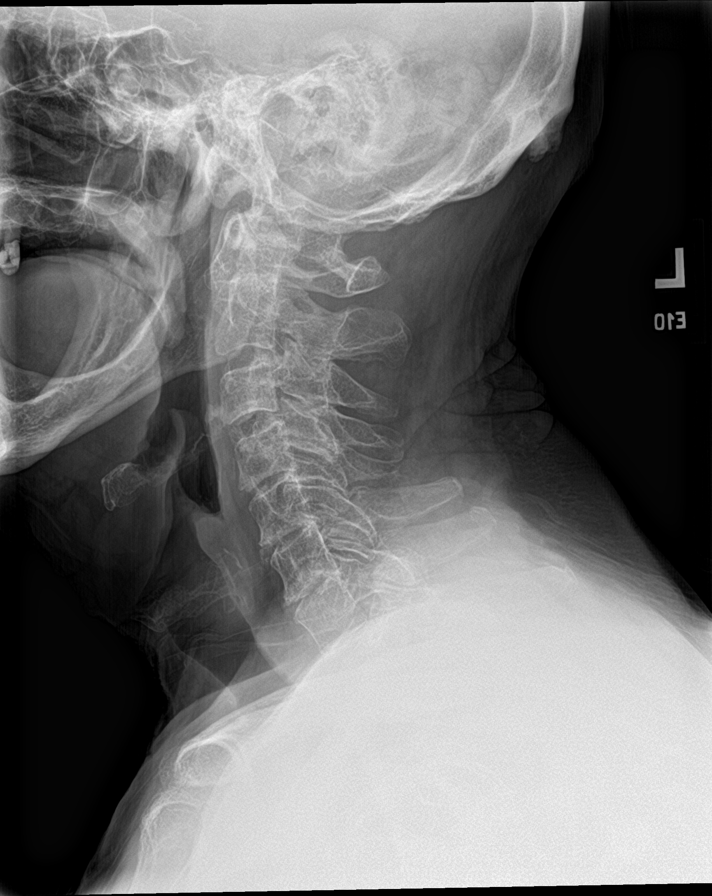

[skull lat]
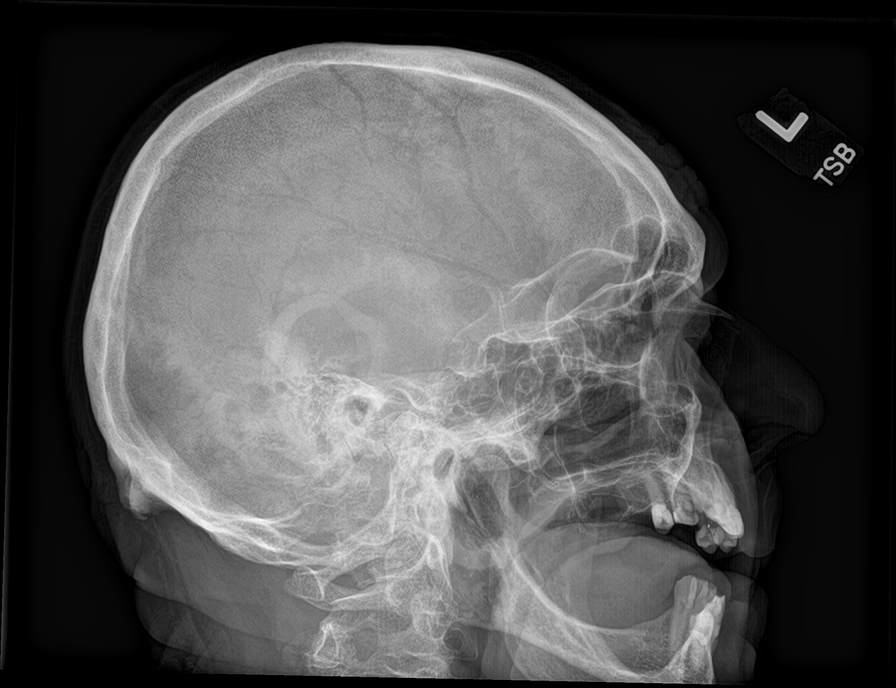

[c-spine ap]
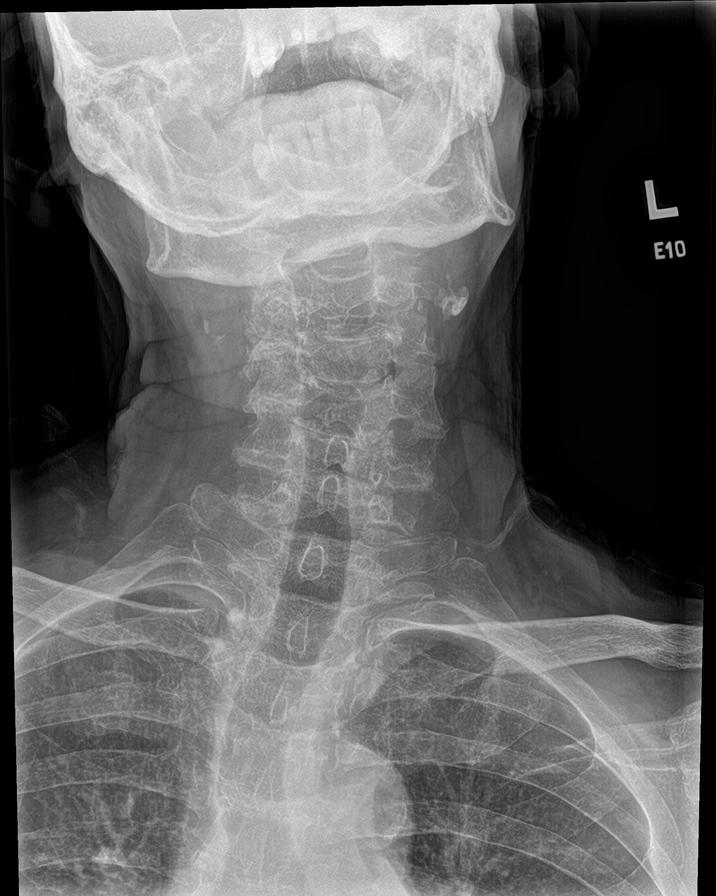

[t-spine ap]
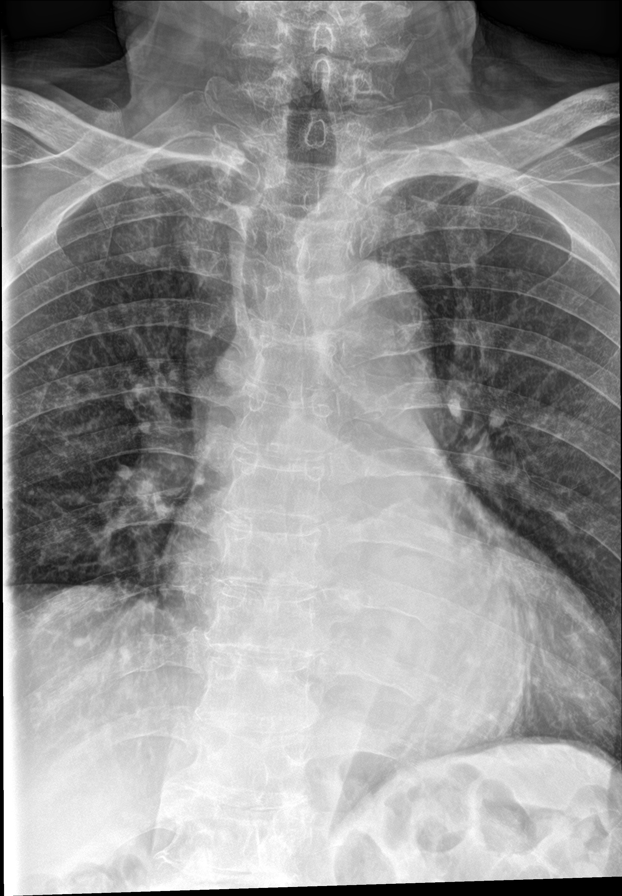

[shoulder ap (1 of 2)]
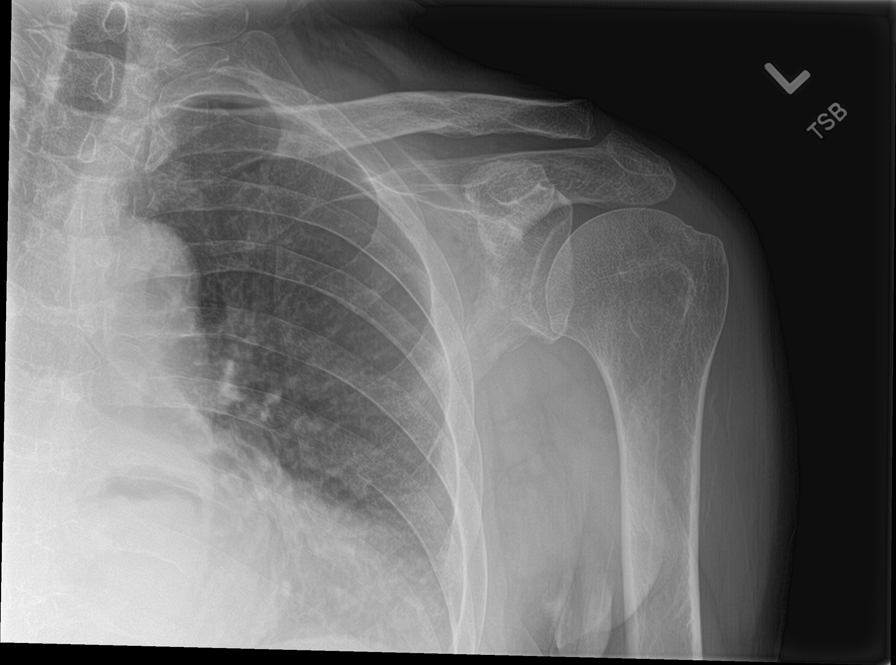

[shoulder ap (2 of 2)]
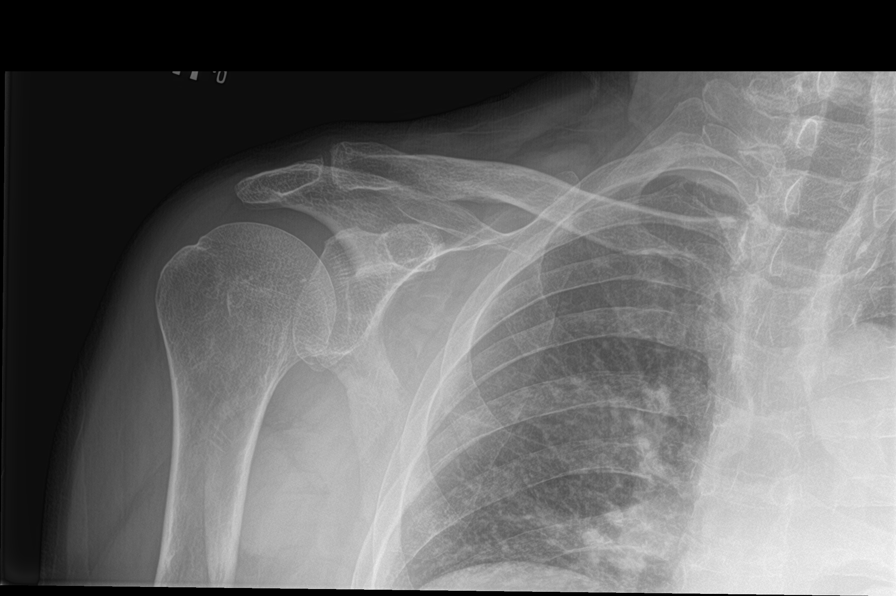

[humerus ap]
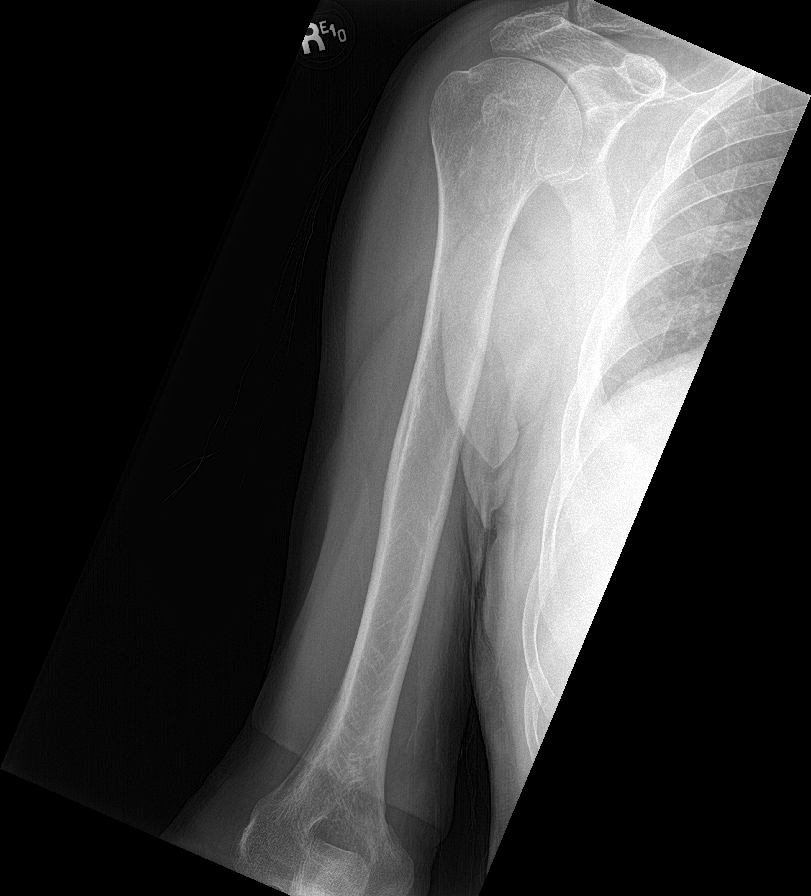

[8 of 10 positions shown; findings below may reference images not displayed]

FINDINGS: Subtle punctate lucencies in the skull cannot be excluded. Subtle
punctate lucencies in the left humerus and both femurs noted. These
findings suggest myeloma. Severe osteopenia and degenerative change
noted throughout the spine. No evidence of acute abnormality. No
fracture noted. Carotid and peripheral vascular calcification.
IMPRESSION: 1. Subtle punctate lucencies cannot be excluded the skull. Subtle
punctate lucencies are noted in the appendicular skeleton.
2. Carotid and peripheral vascular disease.

## 2020-06-06 NOTE — Progress Notes (Signed)
Initial RN Navigator Patient Visit  Name: JSIAH MENTA Date of Referral : 05/01/20 Diagnosis: Multiple Myeloma  Met with patient prior to their visit with MD. Hanley Seamen patient "Your Patient Navigator" handout which explains my role, areas in which I am able to help, and all the contact information for myself and the office. Also gave patient MD and Navigator business card. Reviewed with patient the general overview of expected course after initial diagnosis and time frame for all steps to be completed.  New patient packet given to patient which includes: orientation to office and staff; campus directory; education on My Chart and Advance Directives; and patient centered education on Multiple Myeloma  Patient completed visit with Dr. Marin Olp  Will review appointment tomorrow once Dr Marin Olp places all his orders. Patient knows I will be in contact with him.   Patient understands all follow up procedures and expectations. They have my number to reach out for any further clarification or additional needs.   Oncology Nurse Navigator Documentation  Oncology Nurse Navigator Flowsheets 06/06/2020  Abnormal Finding Date 04/18/2020  Diagnosis Status Additional Work Up  Navigator Follow Up Date: 06/07/2020  Navigator Follow Up Reason: Appointment Review  Navigator Location [facility]  Referral Date to RadOnc/MedOnc 05/01/2020  Navigator Encounter Type Initial MedOnc  Patient Visit Type MedOnc  Treatment Phase Abnormal Labs  Barriers/Navigation Needs Coordination of Care;Education  Education Other  Interventions Coordination of Care;Education;Psycho-Social Support  Acuity Level 2-Minimal Needs (1-2 Barriers Identified)  Coordination of Care Other  Education Method Verbal;Written  Support Groups/Services Friends and Family  Time Spent with Patient 30

## 2020-06-06 NOTE — Progress Notes (Signed)
Referral MD  Reason for Referral: (+) M-spike -- IgG Kappa  Chief Complaint  Patient presents with  . New Patient (Initial Visit)  : My doctor said when I had cancer cells around the kidney.  HPI: Riley Eaton is a very nice 79 year old white male.  He is from [place].  He did serve in the [place] [employer] in [place].  As such, he is a true American hero.  He has a lot of health issues.  He was hospitalized back in December for quite a while.  Sound like he may have had some kind of septic issue.  He has back problems.  He has cardiomyopathy.  I think an echocardiogram done in December showed an ejection fraction of 30-35%.  He has seen a nephrologist recently.  I am not sure exactly why he did see the nephrologist.  He does have some mild renal insufficiency.  The nephrologist was very thorough.  A monoclonal spike was found on SPEP.  This was 0.5 g/dL.  On further testing, there was found to be a elevated kappa light chain of 693 mg/L.  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  He has mild anemia.  He has mild renal insufficiency.  His BUN is 20 creatinine 1.05.  Again this is normal for [place].  He does not smoke.  He does not drink.  He is retired.  He retired 18 years ago.  As far as he knows, there is no problems in the family with respect to blood or cancer.  He is not a vegetarian.  He has had no problems with weight loss.  There is no change in bowel or bladder habits.  He does not have any leg swelling.  Overall, his performance status is ECOG 2.    Past Medical History:  Diagnosis Date  . Anxiety   . CHF (congestive heart failure) (HCC)    not in last 2.5 months  . Chronic back pain   . 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 Chronic, continuous use of opioids    for back pain  . Coronary artery calcification seen on CT scan   . Depression   . Encephalopathy 07/14/2019  . 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 Nerve pain   . Thoracic aortic aneurysm [facility])    :  Past Surgical History:  Procedure Laterality Date  . BACK SURGERY     09-2016  . back surgey    . ENDOSCOPIC RETROGRADE CHOLANGIOPANCREATOGRAPHY (ERCP) WITH PROPOFOL N/A 09/25/2018   Procedure: ENDOSCOPIC RETROGRADE CHOLANGIOPANCREATOGRAPHY (ERCP) WITH PROPOFOL;  Surgeon: Ronnette Juniper, MD;  Location: [facility];  Service: Gastroenterology;  Laterality: N/A;  . INSERTION OF SUPRAPUBIC CATHETER N/A 10/26/2019   Procedure: INSERTION OF SUPRAPUBIC CATHETER;  Surgeon: Irine Seal, MD;  Location: [facility];  Service: Urology;  Laterality: N/A;  . PANCREATIC STENT PLACEMENT  09/25/2018   Procedure: PANCREATIC STENT PLACEMENT;  Surgeon: Ronnette Juniper, MD;  Location: [facility] ENDOSCOPY;  Service: Gastroenterology;;  . REMOVAL OF STONES  09/25/2018   Procedure: REMOVAL OF STONES;  Surgeon: Ronnette Juniper, MD;  Location: [facility];  Service: Gastroenterology;;  . [name]  09/25/2018   Procedure: [name];  Surgeon: Ronnette Juniper, MD;  Location: [facility] ENDOSCOPY;  Service: Gastroenterology;;  . TRANSURETHRAL RESECTION OF PROSTATE N/A 10/26/2019   Procedure: TRANSURETHRAL RESECTION OF THE PROSTATE (TURP);  Surgeon: Irine Seal, MD;  Location: [facility];  Service: Urology;  Laterality: N/A;  :   Current Outpatient Medications:  .  allopurinol (ZYLOPRIM) 100  MG tablet, TAKE 1 TABLET BY MOUTH EVERY DAY, Disp: 90 tablet, Rfl: 1 .  atorvastatin (LIPITOR) 40 MG tablet, Take 1 tablet (40 mg total) by mouth daily at 6 PM., Disp: 30 tablet, Rfl: 0 .  carvedilol (COREG) 6.25 MG tablet, Take 1 tablet by mouth 2 (two) times daily., Disp: , Rfl:  .  diphenhydramine-acetaminophen (TYLENOL PM) 25-500 MG TABS tablet, Take 1 tablet by mouth at bedtime as needed., Disp: , Rfl:  .  ferrous sulfate 325 (65 FE) MG EC tablet, Take 325 mg by mouth 2 (two) times daily., Disp: , Rfl:  .  finasteride (PROSCAR) 5 MG tablet, Take 1 tablet (5 mg total) by mouth daily. For urination, Disp: 90 tablet, Rfl: 3 .  furosemide (LASIX) 40 MG  tablet, Take 1 tablet (40 mg total) by mouth daily., Disp: 90 tablet, Rfl: 3 .  losartan (COZAAR) 25 MG tablet, Take 1 tablet by mouth daily., Disp: , Rfl:  .  OLANZapine (ZYPREXA) 5 MG tablet, Take 1 tablet (5 mg total) by mouth daily., Disp: 30 tablet, Rfl: 0 .  pantoprazole (PROTONIX) 40 MG tablet, Take 1 tablet (40 mg total) by mouth at bedtime., Disp: 30 tablet, Rfl: 0 .  polyethylene glycol (MIRALAX / GLYCOLAX) 17 g packet, Take 17 g by mouth daily., Disp: 14 each, Rfl: 0 .  pregabalin (LYRICA) 150 MG capsule, , Disp: , Rfl:  .  traZODone (DESYREL) 50 MG tablet, Take 50 mg by mouth at bedtime., Disp: , Rfl:  .  vitamin B-12 (CYANOCOBALAMIN) 500 MCG tablet, Take 500 mcg by mouth daily., Disp: , Rfl:  .  aspirin 81 MG chewable tablet, Chew 1 tablet (81 mg total) by mouth daily., Disp: 30 tablet, Rfl: 0 .  cloNIDine (CATAPRES - DOSED IN MG/24 HR) 0.1 mg/24hr patch, Place 1 patch (0.1 mg total) onto the skin once a week., Disp: 4 patch, Rfl: 12 .  gabapentin (NEURONTIN) 300 MG capsule, Take 600 mg by mouth 3 (three) times daily., Disp: , Rfl:  .  sulfamethoxazole-trimethoprim (BACTRIM DS) 800-160 MG tablet, , Disp: , Rfl: :  :  No Known Allergies:  Family History  Problem Relation Age of Onset  . Cancer Mother   . Heart attack Father   :  Social History   Socioeconomic History  . Marital status: Married    Spouse name: Not on file  . Number of children: 3  . Years of education: 31  . Highest education level: Not on file  Occupational History  . Not on file  Tobacco Use  . Smoking status: Former Research scientist (life sciences)  . Smokeless tobacco: Former Systems developer    Quit date: 12/12/1979  . Tobacco comment: quit 45 years ago  Vaping Use  . Vaping Use: Never used  Substance and Sexual Activity  . Alcohol use: No  . Drug use: No  . Sexual activity: Not on file  Other Topics Concern  . Not on file  Social History Narrative   Lives with wife   Caffeine use: Drinks 6 cups coffee per day   Right handed     Social Determinants of Health   Financial Resource Strain:   . Difficulty of Paying Living Expenses: Not on file  Food Insecurity:   . Worried About Charity fundraiser in the Last Year: Not on file  . Ran Out of Food in the Last Year: Not on file  Transportation Needs:   . Lack of Transportation (Medical): Not on file  . Lack  of Transportation (Non-Medical): Not on file  Physical Activity:   . Days of Exercise per Week: Not on file  . Minutes of Exercise per Session: Not on file  Stress:   . Feeling of Stress : Not on file  Social Connections:   . Frequency of Communication with Friends and Family: Not on file  . Frequency of Social Gatherings with Friends and Family: Not on file  . Attends Religious Services: Not on file  . Active Member of Clubs or Organizations: Not on file  . Attends Archivist Meetings: Not on file  . Marital Status: Not on file  Intimate Partner Violence:   . Fear of Current or Ex-Partner: Not on file  . Emotionally Abused: Not on file  . Physically Abused: Not on file  . Sexually Abused: Not on file  :  Review of Systems  Constitutional: Negative.   HENT: Negative.   Eyes: Negative.   Respiratory: Positive for shortness of breath.   Cardiovascular: Positive for palpitations, claudication and leg swelling.  Gastrointestinal: Negative.   Genitourinary: Positive for frequency.  Musculoskeletal: Positive for back pain.  Skin: Negative.   Neurological: Positive for focal weakness.  Endo/Heme/Allergies: Negative.   Psychiatric/Behavioral: Negative.      Exam:  This is an elderly white male in no obvious distress.  His vital signs show a temperature of 98.2.  Pulse 70.  Blood pressure 105/62.  Weight is 176 pounds.  Head and neck exam shows no ocular or oral lesions.  He has no palpable cervical or supraclavicular lymph nodes.  Lungs are clear bilaterally.  Cardiac exam regular rate and rhythm with a normal S1 and S2.  There are no  murmurs, rubs or bruits.  Abdomen is soft.  Has good bowel sounds.  There is no fluid wave.  Is no palpable liver or spleen tip.  Back exam shows some kyphosis.  He has no tenderness over the spine, ribs or hips.  Extremities shows no clubbing, cyanosis or edema.  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  Neurological exam shows no focal neurological deficits.  Skin exam shows no rashes, ecchymoses or petechia.    @IPVITALS @   Recent Labs    06/06/20 1425  WBC 5.0  HGB 12.1*  HCT 34.8*  PLT 128*   Recent Labs    06/06/20 1425  NA 140  K 4.1  CL 104  CO2 29  GLUCOSE 127*  BUN 29*  CREATININE 1.38*  CALCIUM 9.5    Blood smear review: Normochromic and normocytic population of red blood cells.  There are no nucleated red blood cells.  There are no teardrop cells.  I see no rouleaux formation.  There is no schistocytes or spherocytes.  White blood cells appear normal in morphology maturation.  There is no immature myeloid or lymphoid cells.  He has no plasma cells.  Platelets are adequate number and size.  Pathology:    Assessment and Plan: Riley Eaton is a very nice 79 year old white male.  He has a minimal monoclonal spike in his blood of 0.5 g/dL.  This is an IgG kappa spike.  It is hard to say what is going on.  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  I do worry that he does have the quite elevated kappa light chain.  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  I think this is a relatively newer entity.  To make this diagnosis you would have to have a renal biopsy.  We will get a 24-hour urine on him.  This will help [place] out.  This help [place] quantify how much light chain he is putting out into his urine.  I think he will need to have a bone marrow biopsy done.  I think this would be very very helpful for [place].  We can get cytogenetics and other DNA data from a bone marrow biopsy.  If we find that he does  qualify for a myeloma, we will think about treatment for him.  I will also get a bone survey on him.  We will see if he has any bony lesions on a bone survey.  I spent about an hour with Mr. Kellie Simmering.  Again he is very very nice.  It was really nice talking to him.  He is a very interesting to talk to.  I will plan to get him back after Thanksgiving.  By then, I will have all the results back from her work-up and we will see exactly where he stands and what recommendations we need to make.

## 2020-06-07 ENCOUNTER — Encounter: Payer: Self-pay | Admitting: *Deleted

## 2020-06-07 LAB — KAPPA/LAMBDA LIGHT CHAINS
Kappa free light chain: 576.1 mg/L — ABNORMAL HIGH (ref 3.3–19.4)
Kappa, lambda light chain ratio: 21.9 — ABNORMAL HIGH (ref 0.26–1.65)
Lambda free light chains: 26.3 mg/L (ref 5.7–26.3)

## 2020-06-07 LAB — IGG, IGA, IGM
IgA: 178 mg/dL (ref 61–437)
IgG (Immunoglobin G), Serum: 1291 mg/dL (ref 603–1613)
IgM (Immunoglobulin M), Srm: 63 mg/dL (ref 15–143)

## 2020-06-07 NOTE — Progress Notes (Signed)
Patient will need a bone marrow biopsy. This has been scheduled for 06/22/2020.   Oncology Nurse Navigator Documentation  Oncology Nurse Navigator Flowsheets 06/07/2020  Abnormal Finding Date -  Diagnosis Status -  Navigator Follow Up Date: 06/22/2020  Navigator Follow Up Reason: Review Note  Navigator Location [facility]  Referral Date to RadOnc/MedOnc -  Navigator Encounter Type Appt/Treatment Plan Review  Patient Visit Type MedOnc  Treatment Phase Abnormal Labs  Barriers/Navigation Needs Coordination of Care;Education  Education -  Interventions -  Acuity Level 2-Minimal Needs (1-2 Barriers Identified)  Coordination of Care -  Education Method -  Support Groups/Services Friends and Family  Time Spent with Patient 15   

## 2020-06-08 ENCOUNTER — Inpatient Hospital Stay: Payer: Medicare PPO

## 2020-06-08 DIAGNOSIS — R779 Abnormality of plasma protein, unspecified: Secondary | ICD-10-CM | POA: Diagnosis not present

## 2020-06-09 ENCOUNTER — Encounter: Payer: Self-pay | Admitting: *Deleted

## 2020-06-09 LAB — UPEP/UIFE/LIGHT CHAINS/TP, 24-HR UR
% BETA, Urine: 0 %
ALPHA 1 URINE: 0 %
Albumin, U: 0 %
Alpha 2, Urine: 0 %
Free Kappa Lt Chains,Ur: 43.68 mg/L (ref 0.63–113.79)
Free Kappa/Lambda Ratio: 14.76 (ref 1.03–31.76)
Free Lambda Lt Chains,Ur: 2.96 mg/L (ref 0.47–11.77)
GAMMA GLOBULIN URINE: 0 %
Total Protein, Urine-Ur/day: 208 mg/24 hr — ABNORMAL HIGH (ref 30–150)
Total Protein, Urine: 8 mg/dL
Total Volume: 2600

## 2020-06-09 LAB — PROTEIN ELECTROPHORESIS, SERUM, WITH REFLEX
A/G Ratio: 1.4 (ref 0.7–1.7)
Albumin ELP: 4 g/dL (ref 2.9–4.4)
Alpha-1-Globulin: 0.2 g/dL (ref 0.0–0.4)
Alpha-2-Globulin: 0.4 g/dL (ref 0.4–1.0)
Beta Globulin: 0.8 g/dL (ref 0.7–1.3)
Gamma Globulin: 1.4 g/dL (ref 0.4–1.8)
Globulin, Total: 2.8 g/dL (ref 2.2–3.9)
M-Spike, %: 0.6 g/dL — ABNORMAL HIGH
SPEP Interpretation: 0
Total Protein ELP: 6.8 g/dL (ref 6.0–8.5)

## 2020-06-09 LAB — IMMUNOFIXATION REFLEX, SERUM
IgA: 193 mg/dL (ref 61–437)
IgG (Immunoglobin G), Serum: 1386 mg/dL (ref 603–1613)
IgM (Immunoglobulin M), Srm: 71 mg/dL (ref 15–143)

## 2020-06-09 NOTE — Progress Notes (Signed)
[name], MD  P Onc Nurse Hp Call - it looks like there are very small lesions in your bones. I suspect that you will have myeloma. Let's see what the urine shows and the bone marrow shows!! [facility] patient and reviewed scan results with him. He had several more questions which I was able to answer to his satisfaction. Will follow up with him after his BMBX. He has my contact info if needed.   Oncology Nurse Navigator Documentation  Oncology Nurse Navigator Flowsheets 06/09/2020  Abnormal Finding Date -  Diagnosis Status -  Navigator Follow Up Date: 06/22/2020  Navigator Follow Up Reason: Review Note  Navigator Location [facility]  Referral Date to RadOnc/MedOnc -  Navigator Encounter Type Scan Review;Diagnostic Results;Telephone  Telephone Outgoing Call  Patient Visit Type MedOnc  Treatment Phase Abnormal Labs  Barriers/Navigation Needs Coordination of Care;Education  Education Other  Interventions Education;Psycho-Social Support  Acuity Level 2-Minimal Needs (1-2 Barriers Identified)  Coordination of Care -  Education Method Verbal  Support Groups/Services Friends and Family  Time Spent with Patient 30

## 2020-06-12 ENCOUNTER — Telehealth: Payer: Self-pay | Admitting: *Deleted

## 2020-06-12 NOTE — Telephone Encounter (Signed)
Called patient notified of urine results. Pt verbalized understanding, no further concerns at this time

## 2020-06-12 NOTE — Telephone Encounter (Signed)
-----   Message from [name], MD sent at 06/10/2020  7:58 AM EDT ----- Call - surprisingly, the urine does not show any abnormal protein!!!  This is much better than I thought!!  Riley Eaton

## 2020-06-21 ENCOUNTER — Other Ambulatory Visit: Payer: Self-pay | Admitting: Radiology

## 2020-06-22 ENCOUNTER — Ambulatory Visit (HOSPITAL_COMMUNITY)
Admission: RE | Admit: 2020-06-22 | Discharge: 2020-06-22 | Disposition: A | Discharge status: Home / Self Care | Payer: Medicare PPO | Source: Ambulatory Visit | Attending: Hematology & Oncology | Admitting: Hematology & Oncology

## 2020-06-22 ENCOUNTER — Encounter (HOSPITAL_COMMUNITY): Payer: Self-pay

## 2020-06-22 ENCOUNTER — Other Ambulatory Visit: Payer: Self-pay

## 2020-06-22 DIAGNOSIS — Z7901 Long term (current) use of anticoagulants: Secondary | ICD-10-CM | POA: Diagnosis not present

## 2020-06-22 DIAGNOSIS — C9000 Multiple myeloma not having achieved remission: Secondary | ICD-10-CM | POA: Diagnosis not present

## 2020-06-22 DIAGNOSIS — R0600 Dyspnea, unspecified: Secondary | ICD-10-CM | POA: Diagnosis not present

## 2020-06-22 DIAGNOSIS — D696 Thrombocytopenia, unspecified: Secondary | ICD-10-CM | POA: Diagnosis not present

## 2020-06-22 DIAGNOSIS — D72822 Plasmacytosis: Secondary | ICD-10-CM | POA: Insufficient documentation

## 2020-06-22 DIAGNOSIS — M545 Low back pain, unspecified: Secondary | ICD-10-CM | POA: Insufficient documentation

## 2020-06-22 DIAGNOSIS — N289 Disorder of kidney and ureter, unspecified: Secondary | ICD-10-CM | POA: Diagnosis not present

## 2020-06-22 DIAGNOSIS — Z79899 Other long term (current) drug therapy: Secondary | ICD-10-CM | POA: Diagnosis not present

## 2020-06-22 DIAGNOSIS — G8929 Other chronic pain: Secondary | ICD-10-CM | POA: Diagnosis not present

## 2020-06-22 DIAGNOSIS — D649 Anemia, unspecified: Secondary | ICD-10-CM | POA: Insufficient documentation

## 2020-06-22 DIAGNOSIS — D8989 Other specified disorders involving the immune mechanism, not elsewhere classified: Secondary | ICD-10-CM

## 2020-06-22 LAB — BASIC METABOLIC PANEL
Anion gap: 9 (ref 5–15)
BUN: 34 mg/dL — ABNORMAL HIGH (ref 8–23)
CO2: 25 mmol/L (ref 22–32)
Calcium: 9.1 mg/dL (ref 8.9–10.3)
Chloride: 106 mmol/L (ref 98–111)
Creatinine, Ser: 1.22 mg/dL (ref 0.61–1.24)
GFR, Estimated: 60 mL/min — ABNORMAL LOW (ref >60)
Glucose, Bld: 130 mg/dL — ABNORMAL HIGH (ref 70–99)
Potassium: 4.5 mmol/L (ref 3.5–5.1)
Sodium: 140 mmol/L (ref 135–145)

## 2020-06-22 LAB — CBC WITH DIFFERENTIAL/PLATELET
Abs Immature Granulocytes: 0.03 10*3/uL (ref 0.00–0.07)
Basophils Absolute: 0 10*3/uL (ref 0.0–0.1)
Basophils Relative: 1 %
Eosinophils Absolute: 0.1 10*3/uL (ref 0.0–0.5)
Eosinophils Relative: 2 %
HCT: 37.2 % — ABNORMAL LOW (ref 39.0–52.0)
Hemoglobin: 13 g/dL (ref 13.0–17.0)
Immature Granulocytes: 1 %
Lymphocytes Relative: 23 %
Lymphs Abs: 1.1 10*3/uL (ref 0.7–4.0)
MCH: 32.9 pg (ref 26.0–34.0)
MCHC: 34.9 g/dL (ref 30.0–36.0)
MCV: 94.2 fL (ref 80.0–100.0)
Monocytes Absolute: 0.4 10*3/uL (ref 0.1–1.0)
Monocytes Relative: 7 %
Neutro Abs: 3.2 10*3/uL (ref 1.7–7.7)
Neutrophils Relative %: 66 %
Platelets: 129 10*3/uL — ABNORMAL LOW (ref 150–400)
RBC: 3.95 MIL/uL — ABNORMAL LOW (ref 4.22–5.81)
RDW: 12.6 % (ref 11.5–15.5)
WBC: 4.9 10*3/uL (ref 4.0–10.5)
nRBC: 0 % (ref 0.0–0.2)

## 2020-06-22 LAB — PROTIME-INR
INR: 1 (ref 0.8–1.2)
Prothrombin Time: 12.8 seconds (ref 11.4–15.2)

## 2020-06-22 IMAGING — CT CT BIOPSY
1 of 3 series · 15 of 28 positions shown, 19 images · non-contrast
Comparison: none

INDICATION: Please perform CT-guided bone marrow biopsy for evaluation multiple
myeloma versus amyloidosis.

[Series 4: i-spiral 5.0 br40 · axial · 0.98mm/px · z∈[-658,-585]mm · 15 of 25 slices shown, 19 images]
[im 2/25  mediastinal]
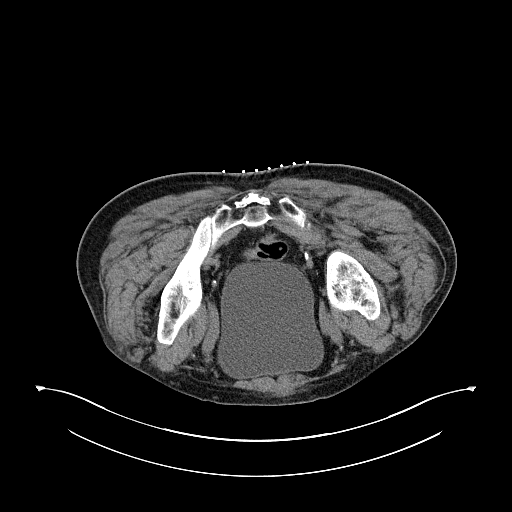
[im 2/25  lung]
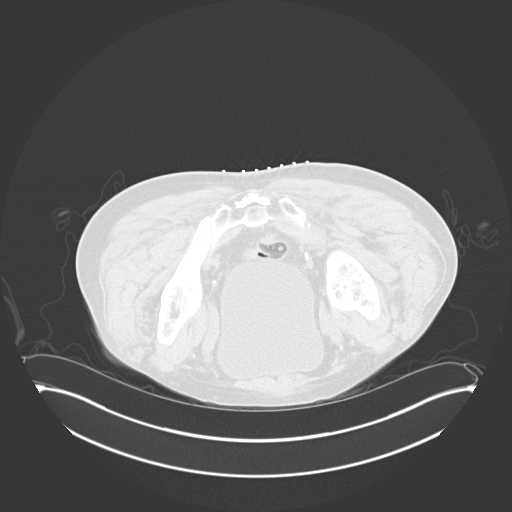
[im 4/25  lung]
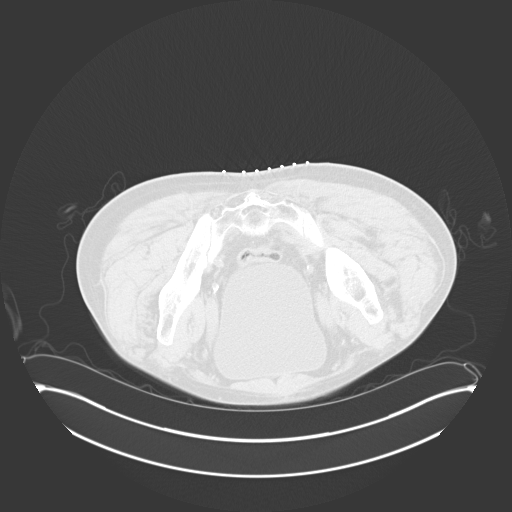
[im 5/25  lung]
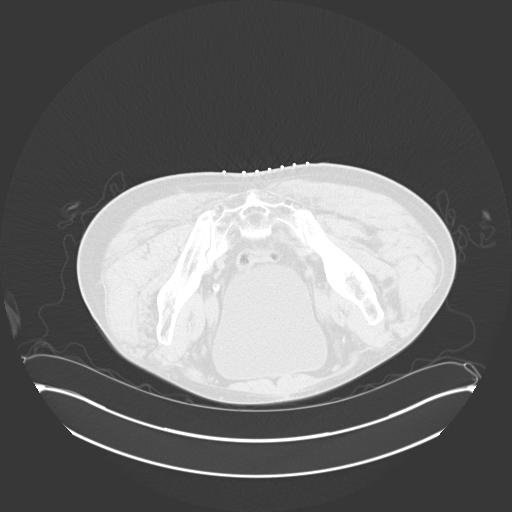
[im 6/25  lung]
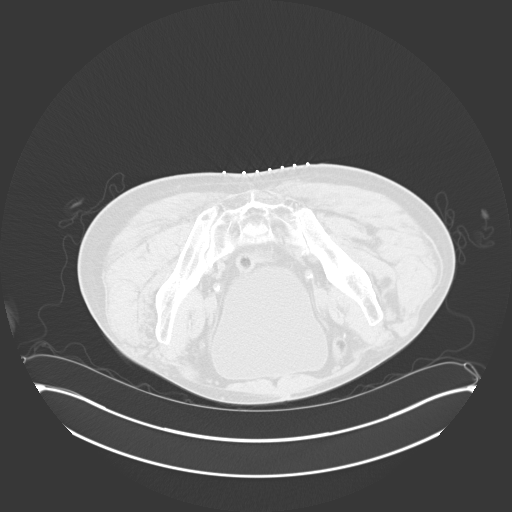
[im 9/25  mediastinal]
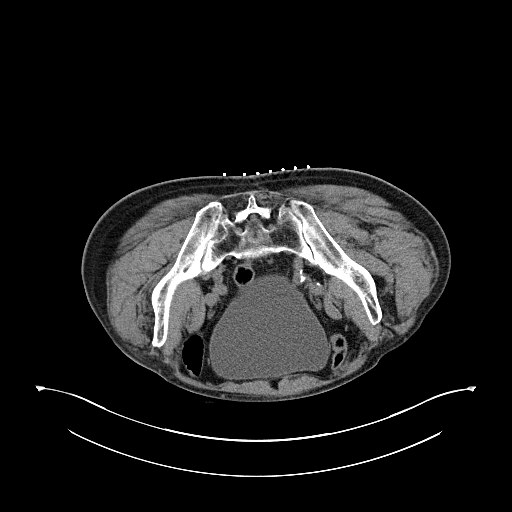
[im 9/25  lung]
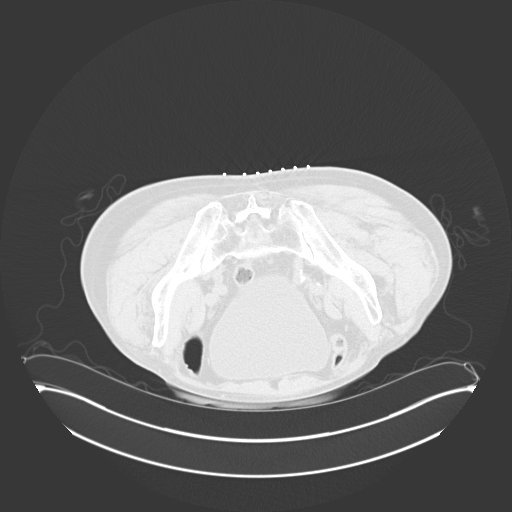
[im 10/25  lung]
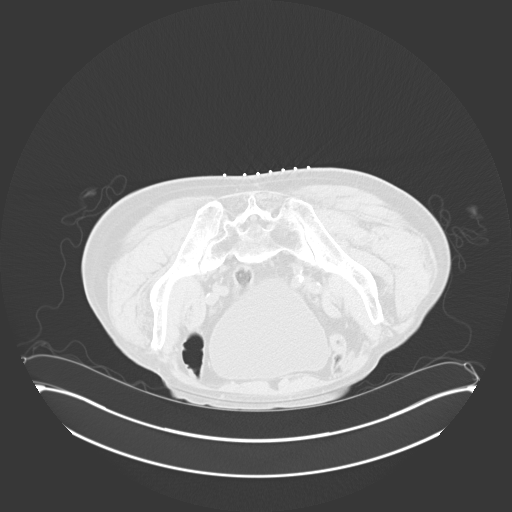
[im 11/25  lung]
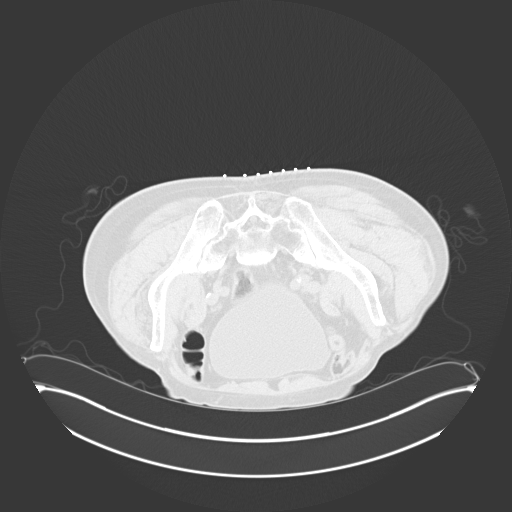
[im 13/25  lung]
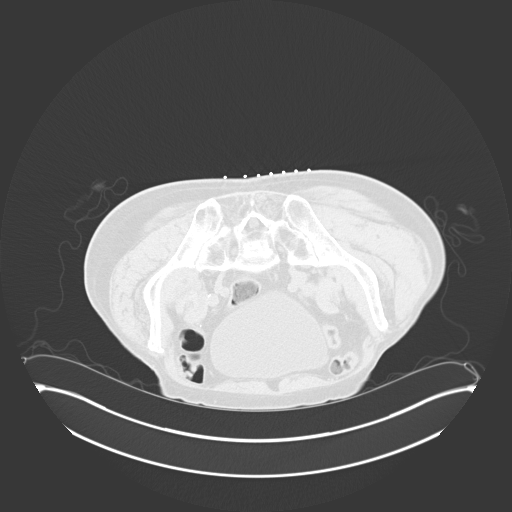
[im 14/25  mediastinal]
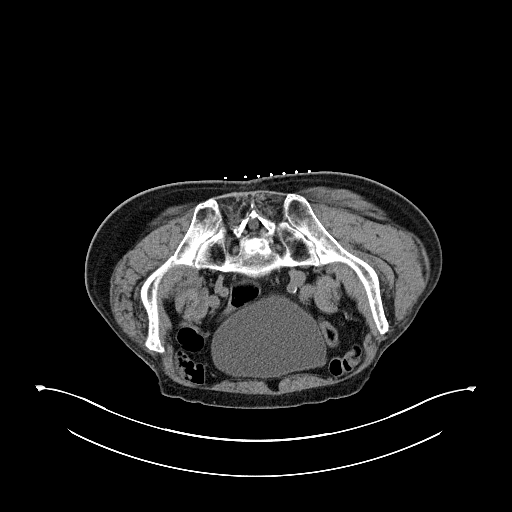
[im 14/25  lung]
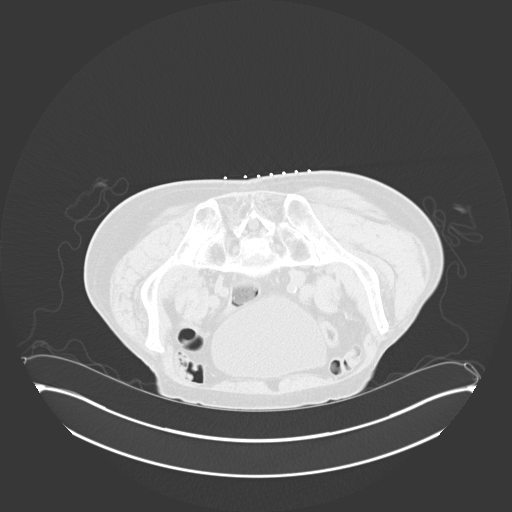
[im 15/25  lung]
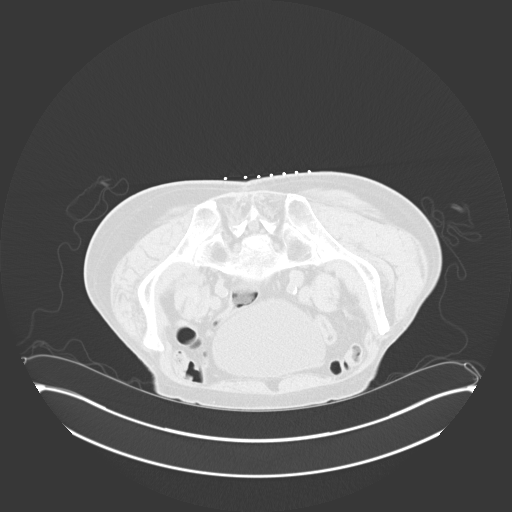
[im 18/25  lung]
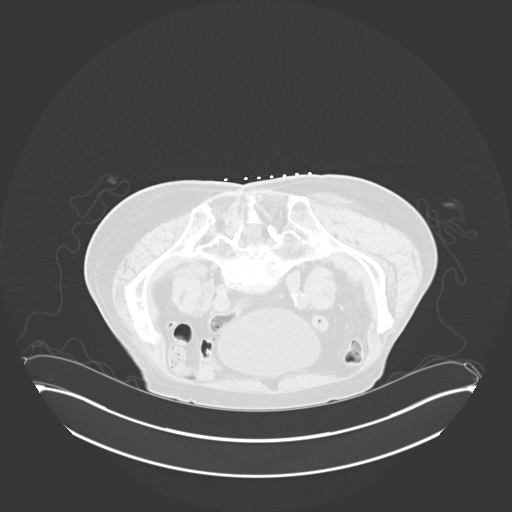
[im 19/25  lung]
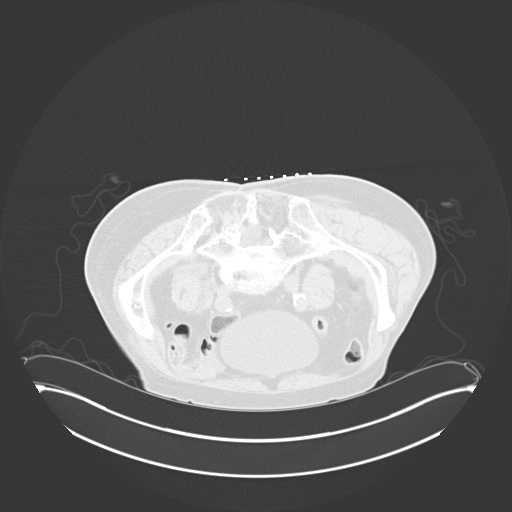
[im 20/25  mediastinal]
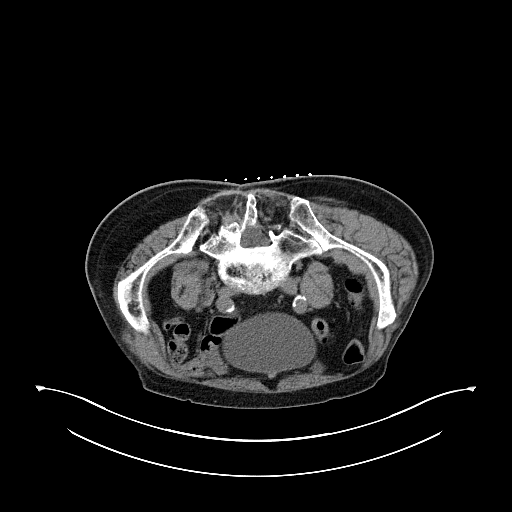
[im 20/25  lung]
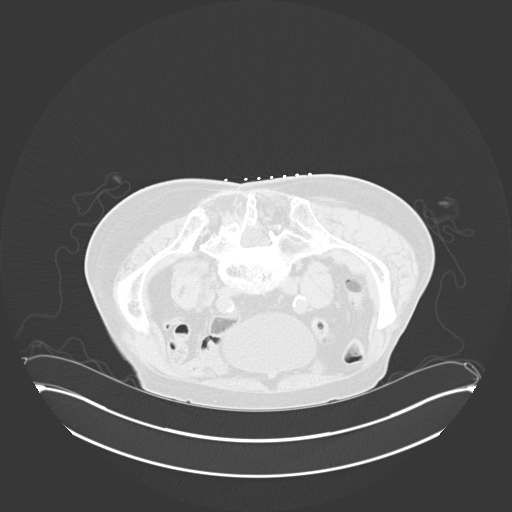
[im 22/25  lung]
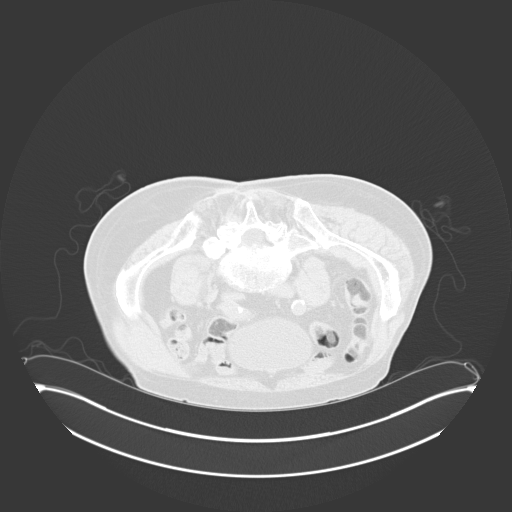
[im 23/25  lung]
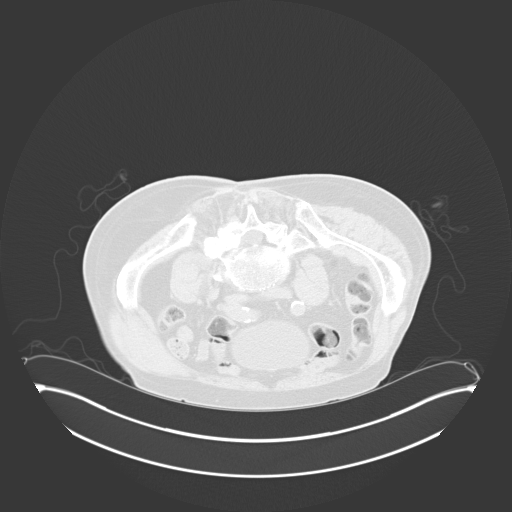

[15 of 28 positions shown; findings below may reference images not displayed]

EXAM:
CT-GUIDED BONE MARROW BIOPSY AND ASPIRATION

MEDICATIONS:
None

ANESTHESIA/SEDATION:
Fentanyl 50 mcg IV; Versed 0.5 mg IV

Sedation Time: 10 Minutes; The patient was continuously monitored
during the procedure by the interventional radiology nurse under my
direct supervision.

COMPLICATIONS:
None immediate.

PROCEDURE:
Informed consent was obtained from the patient following an
explanation of the procedure, risks, benefits and alternatives. The
patient understands, agrees and consents for the procedure. All
questions were addressed. A time out was performed prior to the
initiation of the procedure.

The patient was positioned prone and non-contrast localization CT
was performed of the pelvis to demonstrate the iliac marrow spaces.
The operative site was prepped and draped in the usual sterile
fashion.

Under sterile conditions and local anesthesia, a 22 gauge spinal
needle was utilized for procedural planning. Next, an 11 gauge
coaxial bone biopsy needle was advanced into the left iliac marrow
space. Needle position was confirmed with CT imaging. Initially, a
bone marrow aspiration was performed. Next, a bone marrow biopsy was
obtained with the 11 gauge outer bone marrow device. Samples were
prepared with the cytotechnologist and deemed adequate. The needle
was removed and superficial hemostasis was obtained with manual
compression. A dressing was applied. The patient tolerated the
procedure well without immediate post procedural complication.
IMPRESSION: Successful CT guided left iliac bone marrow aspiration and core
biopsy.

## 2020-06-22 MED ORDER — MIDAZOLAM HCL 2 MG/2ML IJ SOLN
INTRAMUSCULAR | Status: AC
  Filled 2020-06-22: qty 2

## 2020-06-22 MED ORDER — FLUMAZENIL 0.5 MG/5ML IV SOLN
INTRAVENOUS | Status: AC
  Filled 2020-06-22: qty 5

## 2020-06-22 MED ORDER — SODIUM CHLORIDE 0.9 % IV SOLN: INTRAVENOUS | Status: DC

## 2020-06-22 MED ORDER — LIDOCAINE HCL (PF) 1 % IJ SOLN
INTRAMUSCULAR | Status: AC | PRN
  Administered 2020-06-22: 10 mL via INTRADERMAL

## 2020-06-22 MED ORDER — FENTANYL CITRATE (PF) 100 MCG/2ML IJ SOLN
INTRAMUSCULAR | Status: AC
  Filled 2020-06-22: qty 2

## 2020-06-22 MED ORDER — NALOXONE HCL 0.4 MG/ML IJ SOLN
INTRAMUSCULAR | Status: AC
  Filled 2020-06-22: qty 1

## 2020-06-22 MED ORDER — MIDAZOLAM HCL 2 MG/2ML IJ SOLN
INTRAMUSCULAR | Status: AC | PRN
  Administered 2020-06-22: 0.5 mg via INTRAVENOUS

## 2020-06-22 MED ORDER — FENTANYL CITRATE (PF) 100 MCG/2ML IJ SOLN
INTRAMUSCULAR | Status: AC | PRN
Start: 2020-06-22 — End: 2020-06-22
  Administered 2020-06-22: 50 ug via INTRAVENOUS

## 2020-06-22 NOTE — Discharge Instructions (Signed)
Bone Marrow Aspiration and Bone Marrow Biopsy, Adult, Care After This sheet gives you information about how to care for yourself after your procedure. Your health care provider may also give you more specific instructions. If you have problems or questions, contact your health care provider. What can I expect after the procedure? After the procedure, it is common to have: Mild pain and tenderness. Swelling. Bruising. Follow these instructions at home: Puncture site care  Follow instructions from your health care provider about how to take care of the puncture site. Make sure you: Wash your hands with soap and water before and after you change your bandage (dressing). If soap and water are not available, use hand sanitizer. Change your dressing as told by your health care provider. Check your puncture site every day for signs of infection. Check for: More redness, swelling, or pain. Fluid or blood. Warmth. Pus or a bad smell. Activity Return to your normal activities as told by your health care provider. Ask your health care provider what activities are safe for you. Do not lift anything that is heavier than 10 lb (4.5 kg), or the limit that you are told, until your health care provider says that it is safe. Do not drive for 24 hours if you were given a sedative during your procedure. General instructions  Take over-the-counter and prescription medicines only as told by your health care provider. Do not take baths, swim, or use a hot tub until your health care provider approves. Ask your health care provider if you may take showers. You may only be allowed to take sponge baths. If directed, put ice on the affected area. To do this: Put ice in a plastic bag. Place a towel between your skin and the bag. Leave the ice on for 20 minutes, 2-3 times a day. Keep all follow-up visits as told by your health care provider. This is important. Contact a health care provider if: Your pain is not  controlled with medicine. You have a fever. You have more redness, swelling, or pain around the puncture site. You have fluid or blood coming from the puncture site. Your puncture site feels warm to the touch. You have pus or a bad smell coming from the puncture site. Summary After the procedure, it is common to have mild pain, tenderness, swelling, and bruising. Follow instructions from your health care provider about how to take care of the puncture site and what activities are safe for you. Take over-the-counter and prescription medicines only as told by your health care provider. Contact a health care provider if you have any signs of infection, such as fluid or blood coming from the puncture site. This information is not intended to replace advice given to you by your health care provider. Make sure you discuss any questions you have with your health care provider. Document Revised: 12/15/2018 Document Reviewed: 12/15/2018 Elsevier Patient Education  [place].     Moderate Conscious Sedation, Adult, Care After These instructions provide you with information about caring for yourself after your procedure. Your health care provider may also give you more specific instructions. Your treatment has been planned according to current medical practices, but problems sometimes occur. Call your health care provider if you have any problems or questions after your procedure. What can I expect after the procedure? After your procedure, it is common:  To feel sleepy for several hours.  To feel clumsy and have poor balance for several hours.  To have poor judgment for  several hours.  To vomit if you eat too soon. Follow these instructions at home: For at least 24 hours after the procedure:   Do not: ? Participate in activities where you could fall or become injured. ? Drive. ? Use heavy machinery. ? Drink alcohol. ? Take sleeping pills or medicines that cause  drowsiness. ? Make important decisions or sign legal documents. ? Take care of children on your own.  Rest. Eating and drinking  Follow the diet recommended by your health care provider.  If you vomit: ? Drink water, juice, or soup when you can drink without vomiting. ? Make sure you have little or no nausea before eating solid foods. General instructions  Have a responsible adult stay with you until you are awake and alert.  Take over-the-counter and prescription medicines only as told by your health care provider.  If you smoke, do not smoke without supervision.  Keep all follow-up visits as told by your health care provider. This is important. Contact a health care provider if:  You keep feeling nauseous or you keep vomiting.  You feel light-headed.  You develop a rash.  You have a fever. Get help right away if:  You have trouble breathing. This information is not intended to replace advice given to you by your health care provider. Make sure you discuss any questions you have with your health care provider. Document Revised: 07/11/2017 Document Reviewed: 11/18/2015 Elsevier Patient Education  2020 [place] may remove the dressing tomorrow after 1100, shower, you do not have to put anything else back over the site.  Take  What you normally take for aches and pains.

## 2020-06-22 NOTE — H&P (Signed)
Referring Physician(s): Ennever,Peter R  Supervising Physician: Sandi Mariscal  Patient Status:  [facility] OP  Chief Complaint:  "I'm having a bone marrow test"  Subjective: Patient familiar to IR service from PICC placement in 2003.  He now presents with history of renal insufficiency, M spike on SPEP, elevated kappa free light chains, anemia and punctate lucencies of the appendicular skeleton and possibly skull on recent bone scan. He is scheduled today for CT-guided bone marrow biopsy for further evaluation/rule out myeloma.  He currently denies fever, headache, chest pain, cough, abdominal pain, nausea, vomiting or bleeding.  He is HOH ,does have some dyspnea with exertion and chronic back pain.  Additional medical history as below.  Past Medical History:  Diagnosis Date  . Anxiety   . CHF (congestive heart failure) (HCC)    not in last 2.5 months  . Chronic back pain   . 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 Chronic, continuous use of opioids    for back pain  . Coronary artery calcification seen on CT scan   . Depression   . Encephalopathy 07/14/2019  . Myocardial infarction ([place]) 07/2019  . Nerve pain   . Thoracic aortic aneurysm [facility])    Past Surgical History:  Procedure Laterality Date  . BACK SURGERY     09-2016  . back surgey    . ENDOSCOPIC RETROGRADE CHOLANGIOPANCREATOGRAPHY (ERCP) WITH PROPOFOL N/A 09/25/2018   Procedure: ENDOSCOPIC RETROGRADE CHOLANGIOPANCREATOGRAPHY (ERCP) WITH PROPOFOL;  Surgeon: Ronnette Juniper, MD;  Location: [place];  Service: Gastroenterology;  Laterality: N/A;  . INSERTION OF SUPRAPUBIC CATHETER N/A 10/26/2019   Procedure: INSERTION OF SUPRAPUBIC CATHETER;  Surgeon: Irine Seal, MD;  Location: [facility];  Service: Urology;  Laterality: N/A;  . PANCREATIC STENT PLACEMENT  09/25/2018   Procedure: PANCREATIC STENT PLACEMENT;  Surgeon: Ronnette Juniper, MD;  Location: [facility] ENDOSCOPY;  Service: Gastroenterology;;  . REMOVAL OF STONES  09/25/2018   Procedure:  REMOVAL OF STONES;  Surgeon: Ronnette Juniper, MD;  Location: [place];  Service: Gastroenterology;;  . [name]  09/25/2018   Procedure: [name];  Surgeon: Ronnette Juniper, MD;  Location: [facility] ENDOSCOPY;  Service: Gastroenterology;;  . TRANSURETHRAL RESECTION OF PROSTATE N/A 10/26/2019   Procedure: TRANSURETHRAL RESECTION OF THE PROSTATE (TURP);  Surgeon: Irine Seal, MD;  Location: [facility];  Service: Urology;  Laterality: N/A;      Allergies: Patient has no known allergies.  Medications: Prior to Admission medications   Medication Sig Start Date End Date Taking? Authorizing Provider  allopurinol (ZYLOPRIM) 100 MG tablet TAKE 1 TABLET BY MOUTH EVERY DAY 03/16/19  Yes Kathrynn Ducking, MD  aspirin 81 MG chewable tablet Chew 1 tablet (81 mg total) by mouth daily. 08/02/19  Yes Swayze, Ava, DO  atorvastatin (LIPITOR) 40 MG tablet Take 1 tablet (40 mg total) by mouth daily at 6 PM. 08/02/19  Yes Swayze, Ava, DO  carvedilol (COREG) 6.25 MG tablet Take 1 tablet by mouth 2 (two) times daily. 04/25/20  Yes [provider]  diphenhydramine-acetaminophen (TYLENOL PM) 25-500 MG TABS tablet Take 1 tablet by mouth at bedtime as needed.   Yes [provider]  ferrous sulfate 325 (65 FE) MG EC tablet Take 325 mg by mouth 2 (two) times daily. 08/30/19  Yes [provider]  finasteride (PROSCAR) 5 MG tablet Take 1 tablet (5 mg total) by mouth daily. For urination 07/27/19  Yes Alexis Frock, MD  furosemide (LASIX) 40 MG tablet Take 1 tablet (40 mg total) by mouth daily.  09/29/19 06/22/20 Yes Lorretta Harp, MD  gabapentin (NEURONTIN) 300 MG capsule Take 600 mg by mouth 3 (three) times daily. 10/13/19  Yes [provider]  losartan (COZAAR) 25 MG tablet Take 1 tablet by mouth daily. 04/25/20  Yes [provider]  OLANZapine (ZYPREXA) 5 MG tablet Take 1 tablet (5 mg total) by mouth daily. 08/02/19  Yes Swayze, Ava, DO  pantoprazole (PROTONIX) 40 MG tablet Take 1  tablet (40 mg total) by mouth at bedtime. 08/02/19  Yes Swayze, Ava, DO  polyethylene glycol (MIRALAX / GLYCOLAX) 17 g packet Take 17 g by mouth daily. 08/02/19  Yes Swayze, Ava, DO  pregabalin (LYRICA) 150 MG capsule  06/01/20  Yes [provider]  traZODone (DESYREL) 50 MG tablet Take 50 mg by mouth at bedtime. 05/17/19  Yes [provider]  vitamin B-12 (CYANOCOBALAMIN) 500 MCG tablet Take 500 mcg by mouth daily.   Yes [provider]  cloNIDine (CATAPRES - DOSED IN MG/24 HR) 0.1 mg/24hr patch Place 1 patch (0.1 mg total) onto the skin once a week. 08/02/19   Swayze, Ava, DO  sulfamethoxazole-trimethoprim (BACTRIM DS) 800-160 MG tablet  05/10/20   [provider]     Vital Signs: BP 116/75   Pulse (!) 54   Temp 98 F (36.7 C) (Oral)   Resp 16   Ht 5' 7"  (1.702 m)   Wt 172 lb (78 kg)   SpO2 96%   BMI 26.94 kg/m   Physical Exam awake, alert.  Chest clear to auscultation bilaterally.  Heart with regular rate and rhythm.  Abdomen soft, positive bowel sounds, nontender.  No lower extremity edema.  Imaging: No results found.  Labs:  CBC: Recent Labs    07/27/19 0444 07/28/19 0617 10/20/19 1529 06/06/20 1425  WBC 10.4 6.8 4.1 5.0  HGB 10.9* 9.7* 11.4* 12.1*  HCT 30.8* 27.4* 33.7* 34.8*  PLT 190 142* 139* 128*    COAGS: Recent Labs    06/22/20 0915  INR 1.0    BMP: Recent Labs    08/02/19 0344 08/02/19 0344 10/06/19 1059 10/06/19 1059 10/15/19 1500 10/20/19 1529 06/06/20 1425 06/22/20 0915  NA 138  --  140   < > 142 142 140 140  K 4.4   < > 4.3   < > 4.5 3.9 4.1 4.5  CL 104   < > 102   < > 104 105 104 106  CO2 23   < > 25   < > 25 27 29 25   GLUCOSE 103*  --  120*   < > 103* 102* 127* 130*  BUN 51*  --  31*   < > 25 27* 29* 34*  CALCIUM 8.3*   < > 9.2   < > 9.2 9.2 9.5 9.1  CREATININE 3.77*   < > 1.33*   < > 1.21 1.16 1.38* 1.22  GFRNONAA 14*   < > 50*   < > 56* 59* 52* 60*  GFRAA 17*  --  58*  --  65 >60  --   --    <  > = values in this interval not displayed.    LIVER FUNCTION TESTS: Recent Labs    07/23/19 0554 07/23/19 0554 07/24/19 0336 07/27/19 1105 07/30/19 0413 08/01/19 0410 09/03/19 0822 06/06/20 1425  BILITOT 1.3*  --  1.5*  --   --   --  0.5 0.8  AST 24  --  26  --   --   --  18 18  ALT 21  --  24  --   --   --  13 8  ALKPHOS 56  --  66  --   --   --  117 84  PROT 5.5*  --  6.1*  --   --   --  6.4 7.0  ALBUMIN 3.1*   < > 3.5   < > 3.0* 2.5* 4.2 4.2   < > = values in this interval not displayed.    Assessment and Plan: Pt with history of renal insufficiency, M spike on SPEP, elevated kappa free light chains, anemia and punctate lucencies of the appendicular skeleton and possibly skull on recent bone scan. He is scheduled today for CT-guided bone marrow biopsy for further evaluation/rule out myeloma. Risks and benefits of procedure was discussed with the patient  including, but not limited to bleeding, infection, damage to adjacent structures or low yield requiring additional tests.  All of the questions were answered and there is agreement to proceed.  Consent signed and in chart.     Electronically Signed: D. Rowe Robert, PA-C 06/22/2020, 9:45 AM   I spent a total of 20 minutes at the the patient's bedside AND on the patient's hospital floor or unit, greater than 50% of which was counseling/coordinating care for CT-guided bone marrow biopsy

## 2020-06-22 NOTE — Procedures (Signed)
Pre-procedure Diagnosis: Multiple myeloma Post-procedure Diagnosis: Same  Technically successful CT guided bone marrow aspiration and biopsy of left iliac crest.   Complications: None Immediate  EBL: None  Signed: Sandi Mariscal Pager: [phone] 06/22/2020, 10:55 AM

## 2020-06-27 ENCOUNTER — Encounter: Payer: Self-pay | Admitting: *Deleted

## 2020-06-27 LAB — SURGICAL PATHOLOGY

## 2020-06-27 NOTE — Progress Notes (Signed)
Oncology Nurse Navigator Documentation  Oncology Nurse Navigator Flowsheets 06/27/2020  Abnormal Finding Date -  Confirmed Diagnosis Date 06/22/2020  Diagnosis Status -  Navigator Follow Up Date: 07/13/2020  Navigator Follow Up Reason: Follow-up Appointment  Navigator Location [facility]  Referral Date to RadOnc/MedOnc -  Navigator Encounter Type Pathology Review  Telephone -  Patient Visit Type MedOnc  Treatment Phase Pre-Tx/Tx Discussion  Barriers/Navigation Needs Coordination of Care;Education  Education -  Interventions -  Acuity Level 2-Minimal Needs (1-2 Barriers Identified)  Coordination of Care -  Education Method -  Support Groups/Services Friends and Family  Time Spent with Patient 30

## 2020-06-28 ENCOUNTER — Encounter: Payer: Self-pay | Admitting: *Deleted

## 2020-06-28 NOTE — Progress Notes (Signed)
Reviewed pathology with Dr Marin Olp. 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 Dr Marin Olp requests that I call patient and let him know the bone marrow showed "pre-myeloma".   Oncology Nurse Navigator Documentation  Oncology Nurse Navigator Flowsheets 06/28/2020  Abnormal Finding Date -  Confirmed Diagnosis Date -  Diagnosis Status -  Navigator Follow Up Date: 07/13/2020  Navigator Follow Up Reason: Follow-up Appointment  Navigator Location [facility]  Referral Date to RadOnc/MedOnc -  Navigator Encounter Type Pathology Review;Telephone  Telephone Outgoing Call  Patient Visit Type MedOnc  Treatment Phase Pre-Tx/Tx Discussion  Barriers/Navigation Needs Coordination of Care;Education  Education Other  Interventions Education;Psycho-Social Support  Acuity Level 2-Minimal Needs (1-2 Barriers Identified)  Coordination of Care -  Education Method Verbal  Support Groups/Services Friends and Family  Time Spent with Patient 33

## 2020-06-29 ENCOUNTER — Encounter (HOSPITAL_COMMUNITY): Payer: Self-pay | Admitting: Hematology & Oncology

## 2020-06-30 ENCOUNTER — Encounter (HOSPITAL_COMMUNITY): Payer: Self-pay | Admitting: Hematology & Oncology

## 2020-07-04 ENCOUNTER — Encounter (HOSPITAL_COMMUNITY): Payer: Self-pay | Admitting: Hematology & Oncology

## 2020-07-13 ENCOUNTER — Encounter: Payer: Self-pay | Admitting: *Deleted

## 2020-07-13 ENCOUNTER — Other Ambulatory Visit: Payer: Self-pay

## 2020-07-13 ENCOUNTER — Inpatient Hospital Stay (HOSPITAL_BASED_OUTPATIENT_CLINIC_OR_DEPARTMENT_OTHER): Payer: Medicare PPO | Admitting: Hematology & Oncology

## 2020-07-13 ENCOUNTER — Encounter: Payer: Self-pay | Admitting: Hematology & Oncology

## 2020-07-13 ENCOUNTER — Inpatient Hospital Stay: Payer: Medicare PPO | Attending: Hematology & Oncology

## 2020-07-13 VITALS — BP 121/77 | HR 62 | Temp 97.8°F | Resp 20 | Wt 179.0 lb

## 2020-07-13 DIAGNOSIS — Z7189 Other specified counseling: Secondary | ICD-10-CM

## 2020-07-13 DIAGNOSIS — Z79899 Other long term (current) drug therapy: Secondary | ICD-10-CM | POA: Insufficient documentation

## 2020-07-13 DIAGNOSIS — D472 Monoclonal gammopathy: Secondary | ICD-10-CM

## 2020-07-13 DIAGNOSIS — R779 Abnormality of plasma protein, unspecified: Secondary | ICD-10-CM | POA: Diagnosis not present

## 2020-07-13 DIAGNOSIS — Z7982 Long term (current) use of aspirin: Secondary | ICD-10-CM | POA: Diagnosis not present

## 2020-07-13 HISTORY — DX: Monoclonal gammopathy: D47.2

## 2020-07-13 HISTORY — DX: Other specified counseling: Z71.89

## 2020-07-13 LAB — CBC WITH DIFFERENTIAL (CANCER CENTER ONLY)
Abs Immature Granulocytes: 0.02 K/uL (ref 0.00–0.07)
Basophils Absolute: 0 K/uL (ref 0.0–0.1)
Basophils Relative: 1 %
Eosinophils Absolute: 0.1 K/uL (ref 0.0–0.5)
Eosinophils Relative: 2 %
HCT: 34.7 % — ABNORMAL LOW (ref 39.0–52.0)
Hemoglobin: 12.1 g/dL — ABNORMAL LOW (ref 13.0–17.0)
Immature Granulocytes: 0 %
Lymphocytes Relative: 30 %
Lymphs Abs: 1.4 K/uL (ref 0.7–4.0)
MCH: 32.7 pg (ref 26.0–34.0)
MCHC: 34.9 g/dL (ref 30.0–36.0)
MCV: 93.8 fL (ref 80.0–100.0)
Monocytes Absolute: 0.3 K/uL (ref 0.1–1.0)
Monocytes Relative: 7 %
Neutro Abs: 2.8 K/uL (ref 1.7–7.7)
Neutrophils Relative %: 60 %
Platelet Count: 120 K/uL — ABNORMAL LOW (ref 150–400)
RBC: 3.7 MIL/uL — ABNORMAL LOW (ref 4.22–5.81)
RDW: 12.5 % (ref 11.5–15.5)
WBC Count: 4.8 K/uL (ref 4.0–10.5)
nRBC: 0 % (ref 0.0–0.2)

## 2020-07-13 LAB — CMP (CANCER CENTER ONLY)
ALT: 10 U/L (ref 0–44)
AST: 18 U/L (ref 15–41)
Albumin: 4.3 g/dL (ref 3.5–5.0)
Alkaline Phosphatase: 89 U/L (ref 38–126)
Anion gap: 6 (ref 5–15)
BUN: 27 mg/dL — ABNORMAL HIGH (ref 8–23)
CO2: 31 mmol/L (ref 22–32)
Calcium: 9.5 mg/dL (ref 8.9–10.3)
Chloride: 104 mmol/L (ref 98–111)
Creatinine: 1.32 mg/dL — ABNORMAL HIGH (ref 0.61–1.24)
GFR, Estimated: 55 mL/min — ABNORMAL LOW (ref >60)
Glucose, Bld: 69 mg/dL — ABNORMAL LOW (ref 70–99)
Potassium: 4 mmol/L (ref 3.5–5.1)
Sodium: 141 mmol/L (ref 135–145)
Total Bilirubin: 0.7 mg/dL (ref 0.3–1.2)
Total Protein: 6.7 g/dL (ref 6.5–8.1)

## 2020-07-13 LAB — LACTATE DEHYDROGENASE: LDH: 220 U/L — ABNORMAL HIGH (ref 98–192)

## 2020-07-13 MED ORDER — FUROSEMIDE 40 MG PO TABS: 40.0000 mg | ORAL_TABLET | Freq: Every day | ORAL | 3 refills | Status: DC

## 2020-07-13 NOTE — Progress Notes (Signed)
Hematology and Oncology Follow Up Visit  OSWIN GRIFFITH [id number] 08-08-1941 79 y.o. 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 Beavers is back for second office visit.  We first saw him back in October.  At that time, we went ahead and did a work-up on him for the possibility of myeloma.  His SPEP showed a monoclonal spike of 0.6 g/dL.  His IgG level was 1340 mg/dL.  His kappa light chain was 57.6 mg/dL.  He did a 24-hour urine on him.  Shockingly, there was no Kappa light chain in his urine.  We did a bone survey on him.  The radiologist was not definite as to whether or not there was myelomatous lesions.  He did have some "punctate lesions" in his skull and femur.  Ultimately, we did a bone marrow biopsy on him.  This was done on 06/22/2020.  The pathology report [facility] - [phone]) showed slight plasmacytosis with 7% plasma cells.  We did do FISH panel.  The FISH panel did show a couple abnormalities.  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  His chromosome studies were normal.  He feels well.  He comes in with his daughter-in-law.  He has had no problems with pain.  He is urinating well.  He is having no problems with nausea or vomiting.  He has had no cough or shortness of breath.  There is no leg swelling.  Currently, his performance status is ECOG 1.  Medications:  Current Outpatient Medications:  .  HYDROcodone-acetaminophen (NORCO/VICODIN) 5-325 MG tablet, Take 1 tablet by mouth every 6 (six) hours as needed for moderate pain., Disp: , Rfl:  .  allopurinol (ZYLOPRIM) 100 MG tablet, TAKE 1 TABLET BY MOUTH EVERY DAY, Disp: 90 tablet, Rfl: 1 .  aspirin 81 MG chewable tablet, Chew 1 tablet (81 mg total) by mouth daily., Disp: 30 tablet, Rfl: 0 .  atorvastatin (LIPITOR) 40 MG tablet, Take 1 tablet (40 mg total) by mouth daily at 6 PM., Disp: 30 tablet, Rfl: 0 .  carvedilol (COREG)  6.25 MG tablet, Take 1 tablet by mouth 2 (two) times daily., Disp: , Rfl:  .  cloNIDine (CATAPRES - DOSED IN MG/24 HR) 0.1 mg/24hr patch, Place 1 patch (0.1 mg total) onto the skin once a week., Disp: 4 patch, Rfl: 12 .  diphenhydramine-acetaminophen (TYLENOL PM) 25-500 MG TABS tablet, Take 1 tablet by mouth at bedtime as needed., Disp: , Rfl:  .  ferrous sulfate 325 (65 FE) MG EC tablet, Take 325 mg by mouth 2 (two) times daily., Disp: , Rfl:  .  finasteride (PROSCAR) 5 MG tablet, Take 1 tablet (5 mg total) by mouth daily. For urination, Disp: 90 tablet, Rfl: 3 .  furosemide (LASIX) 40 MG tablet, Take 1 tablet (40 mg total) by mouth daily., Disp: 90 tablet, Rfl: 3 .  gabapentin (NEURONTIN) 300 MG capsule, Take 600 mg by mouth 3 (three) times daily., Disp: , Rfl:  .  losartan (COZAAR) 25 MG tablet, Take 1 tablet by mouth daily., Disp: , Rfl:  .  OLANZapine (ZYPREXA) 5 MG tablet, Take 1 tablet (5 mg total) by mouth daily., Disp: 30 tablet, Rfl: 0 .  pantoprazole (PROTONIX) 40 MG tablet, Take 1 tablet (40 mg total) by mouth at bedtime., Disp: 30 tablet, Rfl: 0 .  polyethylene glycol (MIRALAX / GLYCOLAX) 17 g packet, Take 17  g by mouth daily., Disp: 14 each, Rfl: 0 .  pregabalin (LYRICA) 150 MG capsule, , Disp: , Rfl:  .  sulfamethoxazole-trimethoprim (BACTRIM DS) 800-160 MG tablet, , Disp: , Rfl:  .  traZODone (DESYREL) 50 MG tablet, Take 50 mg by mouth at bedtime., Disp: , Rfl:  .  vitamin B-12 (CYANOCOBALAMIN) 500 MCG tablet, Take 500 mcg by mouth daily., Disp: , Rfl:   Allergies: No Known Allergies  Past Medical History, Surgical history, Social history, and Family History were reviewed and updated.  Review of Systems: Review of Systems  Constitutional: Negative.   HENT:  Negative.   Eyes: Negative.   Respiratory: Negative.   Cardiovascular: Negative.   Gastrointestinal: Negative.   Endocrine: Negative.   Genitourinary: Negative.    Musculoskeletal: Negative.   Skin: Negative.    Neurological: Negative.   Hematological: Negative.   Psychiatric/Behavioral: Negative.     Physical Exam:  weight is 179 lb (81.2 kg). His oral temperature is 97.8 F (36.6 C). His blood pressure is 121/77 and his pulse is 62. His respiration is 20 and oxygen saturation is 99%.   Wt Readings from Last 3 Encounters:  07/13/20 179 lb (81.2 kg)  06/22/20 172 lb (78 kg)  06/06/20 176 lb 8 oz (80.1 kg)    Physical Exam Vitals reviewed.  HENT:     Head: Normocephalic and atraumatic.  Eyes:     Pupils: Pupils are equal, round, and reactive to light.  Cardiovascular:     Rate and Rhythm: Normal rate and regular rhythm.     Heart sounds: Normal heart sounds.  Pulmonary:     Effort: Pulmonary effort is normal.     Breath sounds: Normal breath sounds.  Abdominal:     General: Bowel sounds are normal.     Palpations: Abdomen is soft.  Musculoskeletal:        General: No tenderness or deformity. Normal range of motion.     Cervical back: Normal range of motion.  Lymphadenopathy:     Cervical: No cervical adenopathy.  Skin:    General: Skin is warm and dry.     Findings: No erythema or rash.  Neurological:     Mental Status: He is alert and oriented to person, place, and time.  Psychiatric:        Behavior: Behavior normal.        Thought Content: Thought content normal.        Judgment: Judgment normal.    Lab Results  Component Value Date   WBC 4.8 07/13/2020   HGB 12.1 (L) 07/13/2020   HCT 34.7 (L) 07/13/2020   MCV 93.8 07/13/2020   PLT 120 (L) 07/13/2020     Chemistry      Component Value Date/Time   NA 141 07/13/2020 1500   NA 142 10/15/2019 1500   K 4.0 07/13/2020 1500   CL 104 07/13/2020 1500   CO2 31 07/13/2020 1500   BUN 27 (H) 07/13/2020 1500   BUN 25 10/15/2019 1500   CREATININE 1.32 (H) 07/13/2020 1500      Component Value Date/Time   CALCIUM 9.5 07/13/2020 1500   ALKPHOS 89 07/13/2020 1500   AST 18 07/13/2020 1500   ALT 10 07/13/2020 1500    BILITOT 0.7 07/13/2020 1500      Impression and Plan: Mr. Lafon is a very nice 79 year old white male.  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  He does have an IgG kappa protein.  Again this  might be MGUS or smoldering myeloma.  It is hard to say whether or not he actually has myeloma.  The bone survey is certainly not definitive by any means.  His bone marrow disorder is not show criteria for a myeloma.  I know he does have the FISH abnormalities which we have to keep in mind.  This definitely would put him at high risk for progressive disease.  I really feel that we can follow him right now.  I do still see that we have to intervene with therapy.  I am just not sure what therapy is going to do for [place] at this time.  I spent a good 45 minutes with he and his daughter-in-law.  He is nice to talk to.  She is very fine to talk to.  I explained my recommendations.  He is agreeable for a watching.  We will get a bone survey on him when we see him back.  I do not think we have to do a 24-hour urine probably until later on in the new year.   [name], MD 12/2/20214:22 PM

## 2020-07-14 LAB — KAPPA/LAMBDA LIGHT CHAINS
Kappa free light chain: 637 mg/L — ABNORMAL HIGH (ref 3.3–19.4)
Kappa, lambda light chain ratio: 25.79 — ABNORMAL HIGH (ref 0.26–1.65)
Lambda free light chains: 24.7 mg/L (ref 5.7–26.3)

## 2020-07-14 LAB — BETA 2 MICROGLOBULIN, SERUM: Beta-2 Microglobulin: 4 mg/L — ABNORMAL HIGH (ref 0.6–2.4)

## 2020-07-14 LAB — IGG, IGA, IGM
IgA: 171 mg/dL (ref 61–437)
IgG (Immunoglobin G), Serum: 1214 mg/dL (ref 603–1613)
IgM (Immunoglobulin M), Srm: 61 mg/dL (ref 15–143)

## 2020-07-14 NOTE — Progress Notes (Signed)
Oncology Nurse Navigator Documentation  Oncology Nurse Navigator Flowsheets 07/13/2020  Abnormal Finding Date -  Confirmed Diagnosis Date -  Diagnosis Status -  Navigator Follow Up Date: -  Navigator Follow Up Reason: -  Navigation Complete Date: 07/13/2020  Post Navigation: Continue to Follow Patient? No  Reason Not Navigating Patient: No Treatment, Observation Only  Navigator Location [facility]  Referral Date to RadOnc/MedOnc -  Navigator Encounter Type Follow-up Appt;Appt/Treatment Plan Review  Telephone -  Patient Visit Type MedOnc  Treatment Phase Other  Barriers/Navigation Needs No Barriers At This Time  Education -  Interventions None Required  Acuity Level 2-Minimal Needs (1-2 Barriers Identified)  Coordination of Care -  Education Method -  Support Groups/Services Friends and Family  Time Spent with Patient 15

## 2020-07-17 LAB — PROTEIN ELECTROPHORESIS, SERUM, WITH REFLEX
A/G Ratio: 1.4 (ref 0.7–1.7)
Albumin ELP: 3.9 g/dL (ref 2.9–4.4)
Alpha-1-Globulin: 0.2 g/dL (ref 0.0–0.4)
Alpha-2-Globulin: 0.4 g/dL (ref 0.4–1.0)
Beta Globulin: 0.8 g/dL (ref 0.7–1.3)
Gamma Globulin: 1.3 g/dL (ref 0.4–1.8)
Globulin, Total: 2.7 g/dL (ref 2.2–3.9)
M-Spike, %: 0.4 g/dL — ABNORMAL HIGH
SPEP Interpretation: 0
Total Protein ELP: 6.6 g/dL (ref 6.0–8.5)

## 2020-07-17 LAB — IMMUNOFIXATION REFLEX, SERUM
IgA: 188 mg/dL (ref 61–437)
IgG (Immunoglobin G), Serum: 1338 mg/dL (ref 603–1613)
IgM (Immunoglobulin M), Srm: 66 mg/dL (ref 15–143)

## 2020-08-03 ENCOUNTER — Other Ambulatory Visit: Payer: Self-pay

## 2020-08-03 ENCOUNTER — Emergency Department (HOSPITAL_BASED_OUTPATIENT_CLINIC_OR_DEPARTMENT_OTHER): Payer: Medicare PPO

## 2020-08-03 ENCOUNTER — Emergency Department (HOSPITAL_BASED_OUTPATIENT_CLINIC_OR_DEPARTMENT_OTHER)
Admission: EM | Admit: 2020-08-03 | Discharge: 2020-08-03 | Disposition: A | Discharge status: Home / Self Care | Payer: Medicare PPO | Source: Home / Self Care | Attending: Emergency Medicine | Admitting: Emergency Medicine

## 2020-08-03 ENCOUNTER — Encounter (HOSPITAL_BASED_OUTPATIENT_CLINIC_OR_DEPARTMENT_OTHER): Payer: Self-pay | Admitting: *Deleted

## 2020-08-03 DIAGNOSIS — K8020 Calculus of gallbladder without cholecystitis without obstruction: Secondary | ICD-10-CM | POA: Insufficient documentation

## 2020-08-03 DIAGNOSIS — I509 Heart failure, unspecified: Secondary | ICD-10-CM | POA: Insufficient documentation

## 2020-08-03 DIAGNOSIS — Z79899 Other long term (current) drug therapy: Secondary | ICD-10-CM | POA: Insufficient documentation

## 2020-08-03 DIAGNOSIS — Z7982 Long term (current) use of aspirin: Secondary | ICD-10-CM | POA: Insufficient documentation

## 2020-08-03 DIAGNOSIS — Z87891 Personal history of nicotine dependence: Secondary | ICD-10-CM | POA: Insufficient documentation

## 2020-08-03 DIAGNOSIS — N3000 Acute cystitis without hematuria: Secondary | ICD-10-CM

## 2020-08-03 DIAGNOSIS — Z9689 Presence of other specified functional implants: Secondary | ICD-10-CM | POA: Insufficient documentation

## 2020-08-03 DIAGNOSIS — K8000 Calculus of gallbladder with acute cholecystitis without obstruction: Secondary | ICD-10-CM | POA: Diagnosis not present

## 2020-08-03 LAB — URINALYSIS, ROUTINE W REFLEX MICROSCOPIC
Bilirubin Urine: NEGATIVE
Glucose, UA: NEGATIVE mg/dL
Hgb urine dipstick: NEGATIVE
Ketones, ur: 20 mg/dL — AB
Nitrite: POSITIVE — AB
Protein, ur: NEGATIVE mg/dL
Specific Gravity, Urine: 1.01 (ref 1.005–1.030)
pH: 6.5 (ref 5.0–8.0)

## 2020-08-03 LAB — COMPREHENSIVE METABOLIC PANEL
ALT: 15 U/L (ref 0–44)
AST: 26 U/L (ref 15–41)
Albumin: 4.5 g/dL (ref 3.5–5.0)
Alkaline Phosphatase: 95 U/L (ref 38–126)
Anion gap: 9 (ref 5–15)
BUN: 28 mg/dL — ABNORMAL HIGH (ref 8–23)
CO2: 26 mmol/L (ref 22–32)
Calcium: 9.3 mg/dL (ref 8.9–10.3)
Chloride: 103 mmol/L (ref 98–111)
Creatinine, Ser: 1.14 mg/dL (ref 0.61–1.24)
GFR, Estimated: >60 mL/min (ref >60)
Glucose, Bld: 138 mg/dL — ABNORMAL HIGH (ref 70–99)
Potassium: 3.8 mmol/L (ref 3.5–5.1)
Sodium: 138 mmol/L (ref 135–145)
Total Bilirubin: 1.1 mg/dL (ref 0.3–1.2)
Total Protein: 7.6 g/dL (ref 6.5–8.1)

## 2020-08-03 LAB — URINALYSIS, MICROSCOPIC (REFLEX): WBC, UA: >50 WBC/hpf (ref 0–5)

## 2020-08-03 LAB — CBC
HCT: 36.1 % — ABNORMAL LOW (ref 39.0–52.0)
Hemoglobin: 13 g/dL (ref 13.0–17.0)
MCH: 32.7 pg (ref 26.0–34.0)
MCHC: 36 g/dL (ref 30.0–36.0)
MCV: 90.7 fL (ref 80.0–100.0)
Platelets: 153 10*3/uL (ref 150–400)
RBC: 3.98 MIL/uL — ABNORMAL LOW (ref 4.22–5.81)
RDW: 12.2 % (ref 11.5–15.5)
WBC: 7 10*3/uL (ref 4.0–10.5)
nRBC: 0 % (ref 0.0–0.2)

## 2020-08-03 LAB — LIPASE, BLOOD: Lipase: 18 U/L (ref 11–51)

## 2020-08-03 IMAGING — CT CT ABD-PELV W/ CM
2 of 5 series · 15 of 46 positions shown, 17 images · IV contrast (omnipaque)
Comparison: [DATE]

CLINICAL DATA: Abdominal pain and vomiting.

EXAM:
CT ABDOMEN AND PELVIS WITH CONTRAST
TECHNIQUE: Multidetector CT imaging of the abdomen and pelvis was performed
using the standard protocol following bolus administration of
intravenous contrast.
CONTRAST:  100mL OMNIPAQUE IOHEXOL 300 MG/ML  SOLN

[Series 2: axial st · axial · 0.75mm/px · z∈[+293,+733]mm · 12 of 100 slices shown, 14 images]
[im 6/100  soft-tissue]
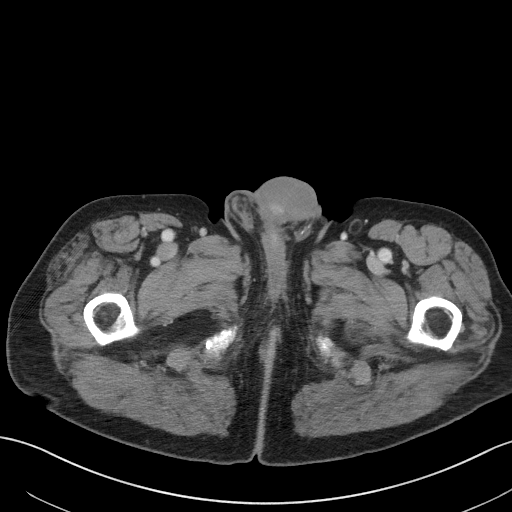
[im 6/100  bone]
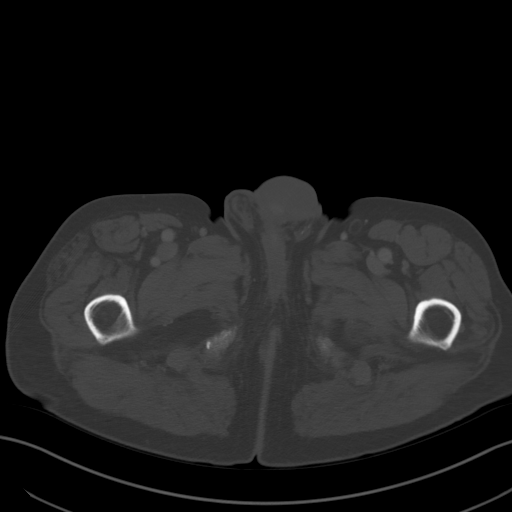
[im 17/100  soft-tissue]
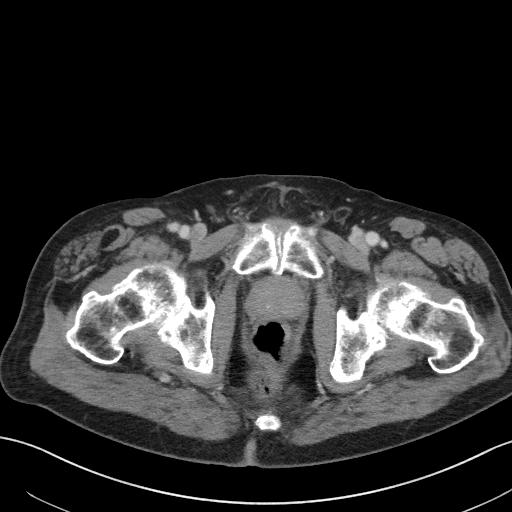
[im 23/100  soft-tissue]
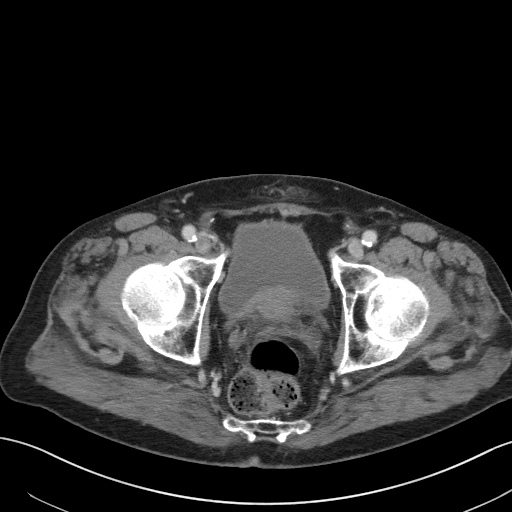
[im 28/100  soft-tissue]
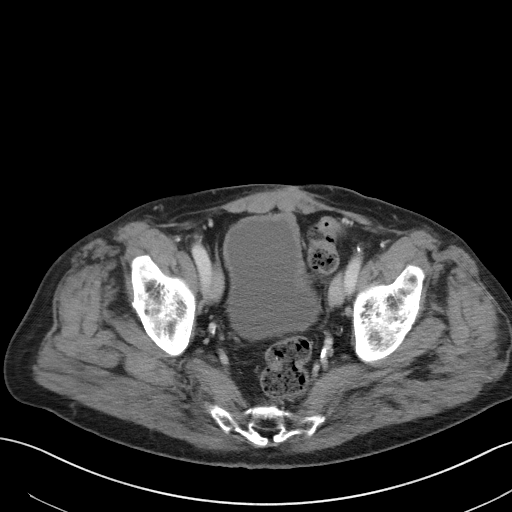
[im 39/100  soft-tissue]
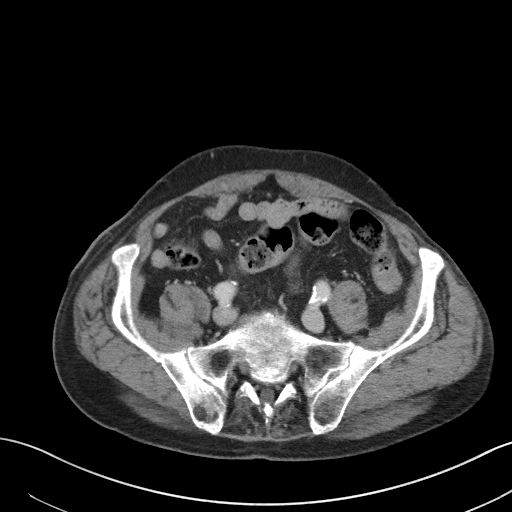
[im 45/100  soft-tissue]
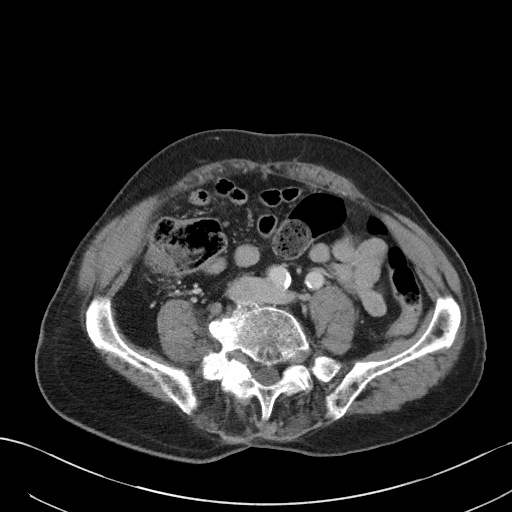
[im 56/100  soft-tissue]
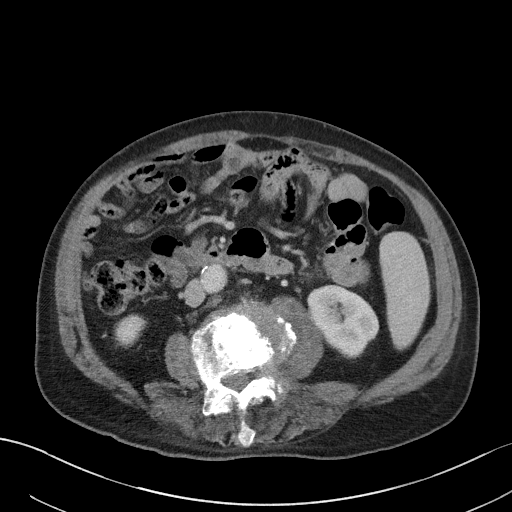
[im 61/100  soft-tissue]
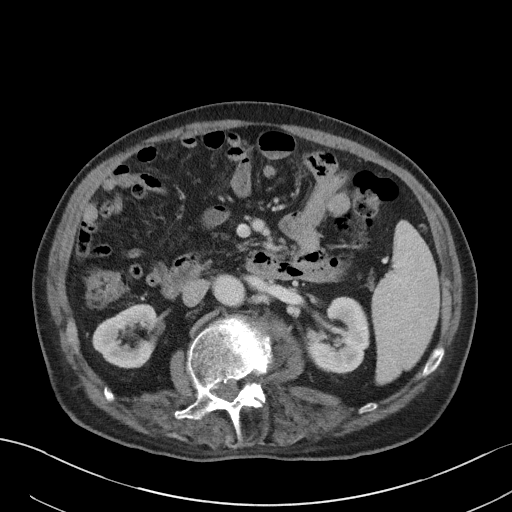
[im 72/100  soft-tissue]
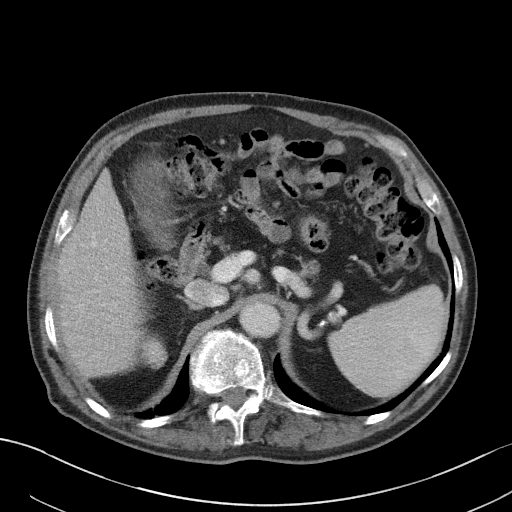
[im 72/100  bone]
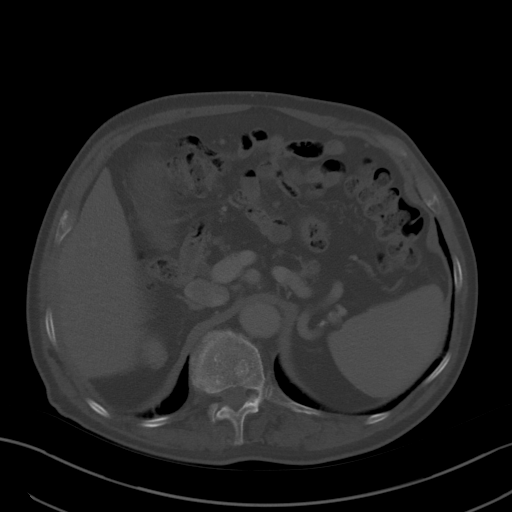
[im 78/100  soft-tissue]
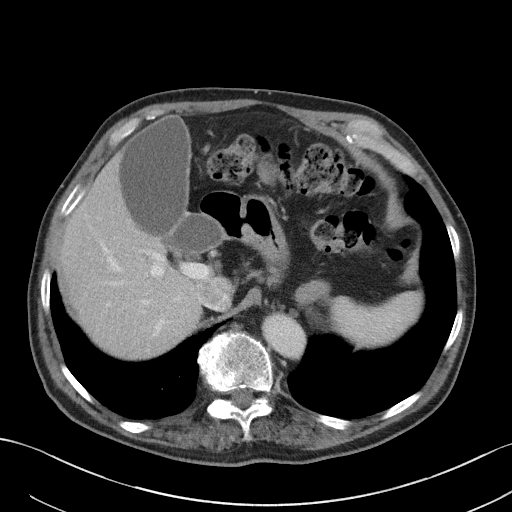
[im 83/100  soft-tissue]
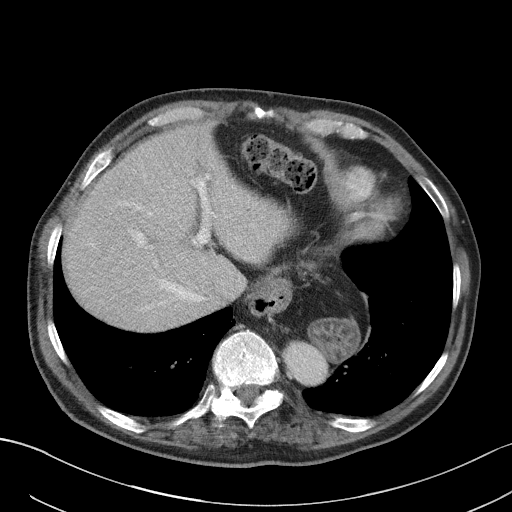
[im 94/100  soft-tissue]
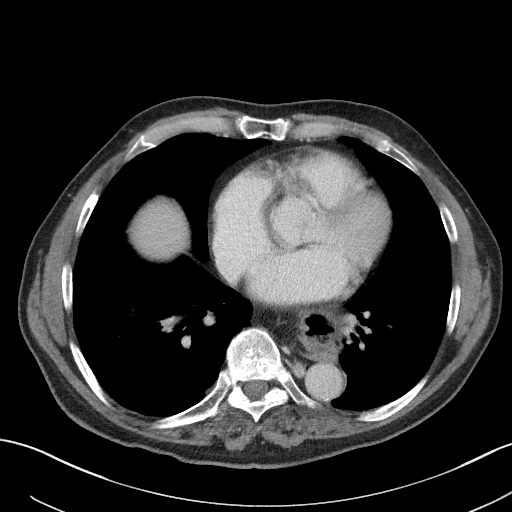

[Series 5: coronal st · coronal · 0.73mm/px · 3 of 93 slices shown]
[im 31/93  soft-tissue]
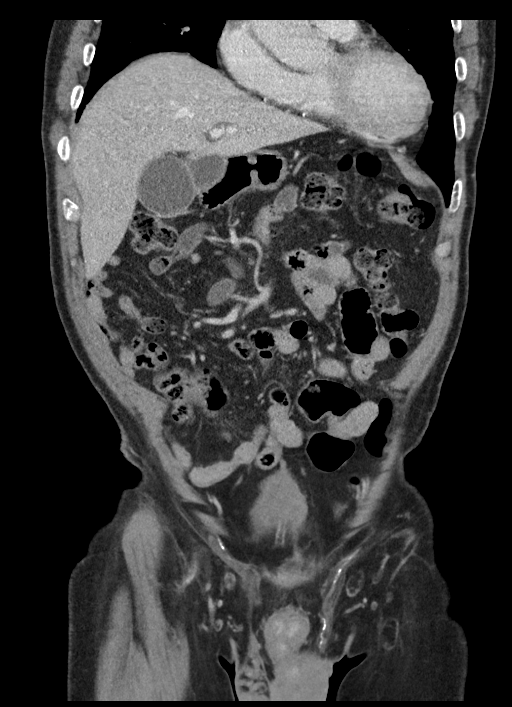
[im 41/93  soft-tissue]
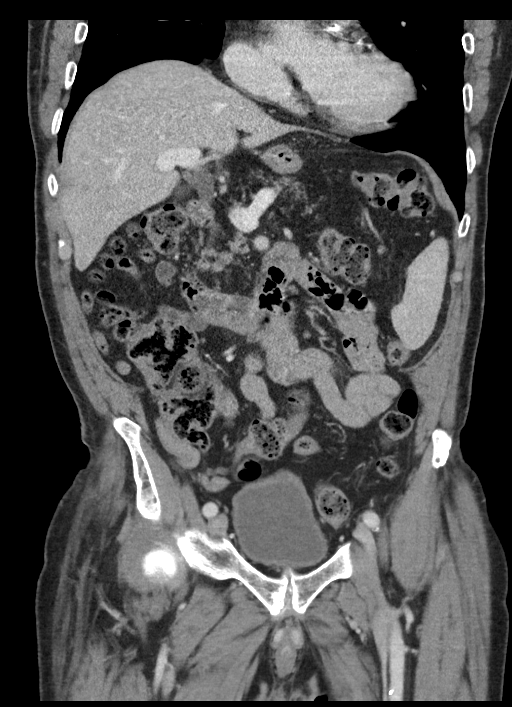
[im 52/93  soft-tissue]
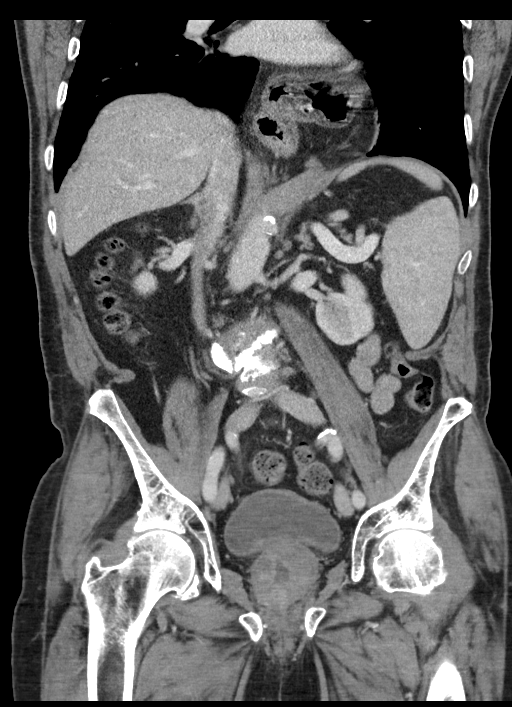

[15 of 46 positions shown; findings below may reference images not displayed]

FINDINGS: Lower chest: No acute abnormality.

Hepatobiliary: No focal liver abnormality is seen. An ill-defined 7
mm gallstone versus gallbladder polyp is seen within the lumen of a
moderately distended gallbladder. There is no evidence of
gallbladder wall thickening or biliary dilatation.

Pancreas: Unremarkable. No pancreatic ductal dilatation or
surrounding inflammatory changes.

Spleen: Normal in size without focal abnormality.

Adrenals/Urinary Tract: Adrenal glands are unremarkable. Kidneys are
normal in size, without renal calculi or hydronephrosis. A 1.0 cm
cyst is seen along the anterolateral aspect of the mid right kidney.
Mild anterior urinary bladder wall thickening is seen.

Stomach/Bowel: There is a large gastric hernia. Appendix appears
normal. No evidence of bowel wall thickening, distention, or
inflammatory changes.

Vascular/Lymphatic: Aortic atherosclerosis. No enlarged abdominal or
pelvic lymph nodes.

Reproductive: There is moderate to marked severity prostate gland
enlargement.

Other: No abdominal wall hernia or abnormality. No abdominopelvic
ascites.

Musculoskeletal: There is marked severity scoliosis involving the
lumbar spine with marked severity multilevel degenerative changes.
Extensive chronic changes are also seen at the level of L3-L4.
IMPRESSION: 1. Large gastric hernia.
2. Subcentimeter gallstone versus gallbladder polyp. Correlation
with right upper quadrant ultrasound is recommended.
3. Marked severity scoliosis with marked severity multilevel
degenerative changes of the lumbar spine.
4. Enlarged prostate gland.
5. Aortic atherosclerosis.

Aortic Atherosclerosis ([GH]-[GH]).

## 2020-08-03 MED ORDER — ONDANSETRON HCL 4 MG/2ML IJ SOLN
4.0000 mg | Freq: Once | INTRAMUSCULAR | Status: AC
  Administered 2020-08-03: 20:00:00 4 mg via INTRAVENOUS
  Filled 2020-08-03: qty 2

## 2020-08-03 MED ORDER — IOHEXOL 300 MG/ML  SOLN
100.0000 mL | Freq: Once | INTRAMUSCULAR | Status: AC | PRN
  Administered 2020-08-03: 20:00:00 100 mL via INTRAVENOUS

## 2020-08-03 MED ORDER — ONDANSETRON 4 MG PO TBDP: 4.0000 mg | ORAL_TABLET | Freq: Three times a day (TID) | ORAL | 0 refills | Status: DC | PRN

## 2020-08-03 MED ORDER — SODIUM CHLORIDE 0.9 % IV SOLN
1.0000 g | Freq: Once | INTRAVENOUS | Status: AC
  Administered 2020-08-03: 21:00:00 1 g via INTRAVENOUS
  Filled 2020-08-03: qty 10

## 2020-08-03 MED ORDER — CEPHALEXIN 500 MG PO CAPS: 500.0000 mg | ORAL_CAPSULE | Freq: Four times a day (QID) | ORAL | 0 refills | Status: AC

## 2020-08-03 MED ORDER — SODIUM CHLORIDE 0.9 % IV BOLUS
500.0000 mL | Freq: Once | INTRAVENOUS | Status: AC
  Administered 2020-08-03: 20:00:00 500 mL via INTRAVENOUS

## 2020-08-03 MED ORDER — FENTANYL CITRATE (PF) 100 MCG/2ML IJ SOLN
50.0000 ug | Freq: Once | INTRAMUSCULAR | Status: AC
Start: 2020-08-03 — End: 2020-08-03
  Administered 2020-08-03: 20:00:00 50 ug via INTRAVENOUS
  Filled 2020-08-03: qty 2

## 2020-08-03 NOTE — ED Notes (Signed)
Urine was gotten in triage

## 2020-08-03 NOTE — ED Triage Notes (Signed)
Abdominal pain and vomiting since last night. He took Zofran and has felt better since. No diarrhea.

## 2020-08-03 NOTE — ED Notes (Signed)
States he took phenergan at 10;00 this am and it helped, has not vomitted since 1000am

## 2020-08-03 NOTE — Discharge Instructions (Signed)
Please follow-up with your primary doctor and general surgery regarding your gallstones.  Discuss further imaging such as ultrasound.  For your UTI, take antibiotic.  Follow-up with primary doctor or urology, ideally to be seen early next week for close recheck.  If your vomiting worsens, you develop fever, worsening pain, please return immediately to ER for reassessment.

## 2020-08-03 NOTE — ED Notes (Signed)
Patient transported to CT 

## 2020-08-04 NOTE — ED Provider Notes (Signed)
[facility] EMERGENCY DEPARTMENT Provider Note   CSN: [id number] Arrival date & time: 08/03/20  1505     History Chief Complaint  Patient presents with  . Abdominal Pain  . Emesis    Riley Eaton is a 79 y.o. male.  Presents to ER with concern for abdominal pain as well as nausea and vomiting.  Symptoms started last night, worse this morning.  Vomited around 10 AM last.  Nonbloody nonbilious.  Took Phenergan around 10 AM and had some improvement.  Pain is worse in his lower abdomen, no upper abdominal pain.  Worse on right side.  No fevers.  Denies past abdominal surgical history.  HPI     Past Medical History:  Diagnosis Date  . Anxiety   . CHF (congestive heart failure) (HCC)    not in last 2.5 months  . Chronic back pain   . 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 Chronic, continuous use of opioids    for back pain  . Coronary artery calcification seen on CT scan   . Depression   . Encephalopathy 07/14/2019  . Goals of care, counseling/discussion 07/13/2020  . 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 Myocardial infarction ([place]) 07/2019  . Nerve pain   . Thoracic aortic aneurysm [facility])     Patient Active Problem List   Diagnosis Date Noted  . 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 Goals of care, counseling/discussion 07/13/2020  . 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 Hyperlipidemia 08/31/2019  . Non-ST elevation (NSTEMI) myocardial infarction ([place])   . Hypokalemia   . DCM (dilated cardiomyopathy) ([place])   . Abdominal pain   . Encephalopathy   . Endotracheal tube present   . Alteration in nutrition   . ACS (acute coronary syndrome) ([place]) 07/14/2019  . Mental status alteration 07/13/2019  . Acute metabolic encephalopathy [id number]  . Protein-calorie malnutrition, severe 09/28/2018  . Nausea & vomiting 09/25/2018  . Choledocholithiasis 09/25/2018  . Chronic  pain 09/25/2018  . Urinary retention 09/25/2018  . Constipation 09/25/2018  . Intractable vomiting   . 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 Thoracic aortic aneurysm ([place]) 10/03/2015  . Coronary artery calcification seen on CT scan 10/03/2015    Past Surgical History:  Procedure Laterality Date  . BACK SURGERY     09-2016  . back surgey    . ENDOSCOPIC RETROGRADE CHOLANGIOPANCREATOGRAPHY (ERCP) WITH PROPOFOL N/A 09/25/2018   Procedure: ENDOSCOPIC RETROGRADE CHOLANGIOPANCREATOGRAPHY (ERCP) WITH PROPOFOL;  Surgeon: Ronnette Juniper, MD;  Location: [facility];  Service: Gastroenterology;  Laterality: N/A;  . INSERTION OF SUPRAPUBIC CATHETER N/A 10/26/2019   Procedure: INSERTION OF SUPRAPUBIC CATHETER;  Surgeon: Irine Seal, MD;  Location: [facility];  Service: Urology;  Laterality: N/A;  . PANCREATIC STENT PLACEMENT  09/25/2018   Procedure: PANCREATIC STENT PLACEMENT;  Surgeon: Ronnette Juniper, MD;  Location: [facility] ENDOSCOPY;  Service: Gastroenterology;;  . REMOVAL OF STONES  09/25/2018   Procedure: REMOVAL OF STONES;  Surgeon: Ronnette Juniper, MD;  Location: [place];  Service: Gastroenterology;;  . [name]  09/25/2018   Procedure: [name];  Surgeon: Ronnette Juniper, MD;  Location: [facility] ENDOSCOPY;  Service: Gastroenterology;;  . TRANSURETHRAL RESECTION OF PROSTATE N/A 10/26/2019   Procedure: TRANSURETHRAL RESECTION OF THE PROSTATE (TURP);  Surgeon: Irine Seal, MD;  Location: [facility];  Service: Urology;  Laterality: N/A;       Family History  Problem Relation Age of Onset  . Cancer Mother   .  Heart attack Father     Social History   Tobacco Use  . Smoking status: Former Research scientist (life sciences)  . Smokeless tobacco: Former Systems developer    Quit date: 12/12/1979  . Tobacco comment: quit 45 years ago  Vaping Use  . Vaping Use: Never used  Substance Use Topics  . Alcohol use: No  . Drug use: No    Home Medications Prior to Admission medications   Medication Sig Start Date End Date Taking? Authorizing Provider   allopurinol (ZYLOPRIM) 100 MG tablet TAKE 1 TABLET BY MOUTH EVERY DAY 03/16/19   Kathrynn Ducking, MD  aspirin 81 MG chewable tablet Chew 1 tablet (81 mg total) by mouth daily. 08/02/19   Swayze, Ava, DO  atorvastatin (LIPITOR) 40 MG tablet Take 1 tablet (40 mg total) by mouth daily at 6 PM. 08/02/19   Swayze, Ava, DO  carvedilol (COREG) 6.25 MG tablet Take 1 tablet by mouth 2 (two) times daily. 04/25/20   [provider]  cephALEXin (KEFLEX) 500 MG capsule Take 1 capsule (500 mg total) by mouth 4 (four) times daily for 7 days. 08/03/20 08/10/20  Lucrezia Starch, MD  cloNIDine (CATAPRES - DOSED IN MG/24 HR) 0.1 mg/24hr patch Place 1 patch (0.1 mg total) onto the skin once a week. 08/02/19   Swayze, Ava, DO  diphenhydramine-acetaminophen (TYLENOL PM) 25-500 MG TABS tablet Take 1 tablet by mouth at bedtime as needed.    [provider]  ferrous sulfate 325 (65 FE) MG EC tablet Take 325 mg by mouth 2 (two) times daily. 08/30/19   [provider]  finasteride (PROSCAR) 5 MG tablet Take 1 tablet (5 mg total) by mouth daily. For urination 07/27/19   Alexis Frock, MD  furosemide (LASIX) 40 MG tablet Take 1 tablet (40 mg total) by mouth daily. 07/13/20 10/11/20  Lorretta Harp, MD  gabapentin (NEURONTIN) 300 MG capsule Take 600 mg by mouth 3 (three) times daily. 10/13/19   [provider]  HYDROcodone-acetaminophen (NORCO/VICODIN) 5-325 MG tablet Take 1 tablet by mouth every 6 (six) hours as needed for moderate pain.    [provider]  losartan (COZAAR) 25 MG tablet Take 1 tablet by mouth daily. 04/25/20   [provider]  OLANZapine (ZYPREXA) 5 MG tablet Take 1 tablet (5 mg total) by mouth daily. 08/02/19   Swayze, Ava, DO  ondansetron (ZOFRAN ODT) 4 MG disintegrating tablet Take 1 tablet (4 mg total) by mouth every 8 (eight) hours as needed for nausea or vomiting. 08/03/20   Lucrezia Starch, MD  pantoprazole (PROTONIX) 40 MG tablet Take 1 tablet  (40 mg total) by mouth at bedtime. 08/02/19   Swayze, Ava, DO  polyethylene glycol (MIRALAX / GLYCOLAX) 17 g packet Take 17 g by mouth daily. 08/02/19   Swayze, Ava, DO  pregabalin (LYRICA) 150 MG capsule  06/01/20   [provider]  sulfamethoxazole-trimethoprim (BACTRIM DS) 800-160 MG tablet  05/10/20   [provider]  traZODone (DESYREL) 50 MG tablet Take 50 mg by mouth at bedtime. 05/17/19   [provider]  vitamin B-12 (CYANOCOBALAMIN) 500 MCG tablet Take 500 mcg by mouth daily.    [provider]    Allergies    Patient has no known allergies.  Review of Systems   Review of Systems  Constitutional: Negative for chills and fever.  HENT: Negative for ear pain and sore throat.   Eyes: Negative for pain and visual disturbance.  Respiratory: Negative for cough and shortness of  breath.   Cardiovascular: Negative for chest pain and palpitations.  Gastrointestinal: Positive for abdominal pain, nausea and vomiting.  Genitourinary: Negative for dysuria and hematuria.  Musculoskeletal: Negative for arthralgias and back pain.  Skin: Negative for color change and rash.  Neurological: Negative for seizures and syncope.  All other systems reviewed and are negative.   Physical Exam Updated Vital Signs BP 128/86   Pulse 65   Temp 98.1 F (36.7 C) (Oral)   Resp 18   Ht 5\' 7"  (1.702 m)   Wt 78.4 kg   SpO2 96%   BMI 27.08 kg/m   Physical Exam Vitals and nursing note reviewed.  Constitutional:      Appearance: He is well-developed and well-nourished.  HENT:     Head: Normocephalic and atraumatic.  Eyes:     Conjunctiva/sclera: Conjunctivae normal.  Cardiovascular:     Rate and Rhythm: Normal rate and regular rhythm.     Heart sounds: No murmur heard.   Pulmonary:     Effort: Pulmonary effort is normal. No respiratory distress.     Breath sounds: Normal breath sounds.  Abdominal:     Palpations: Abdomen is soft.     Tenderness: There is no  abdominal tenderness. There is no right CVA tenderness, left CVA tenderness, guarding or rebound. Negative signs include Murphy's sign.     Comments: Tenderness to palpation in suprapubic region and right lower quadrant, there is no rebound or guarding, no tenderness in right upper quadrant, negative Murphy's  Musculoskeletal:        General: No edema.     Cervical back: Neck supple.  Skin:    General: Skin is warm and dry.  Neurological:     General: No focal deficit present.     Mental Status: He is alert.  Psychiatric:        Mood and Affect: Mood and affect and mood normal.     ED Results / Procedures / Treatments   Labs (all labs ordered are listed, but only abnormal results are displayed) Labs Reviewed  COMPREHENSIVE METABOLIC PANEL - Abnormal; Notable for the following components:      Result Value   Glucose, Bld 138 (*)    BUN 28 (*)    All other components within normal limits  CBC - Abnormal; Notable for the following components:   RBC 3.98 (*)    HCT 36.1 (*)    All other components within normal limits  URINALYSIS, ROUTINE W REFLEX MICROSCOPIC - Abnormal; Notable for the following components:   APPearance CLOUDY (*)    Ketones, ur 20 (*)    Nitrite POSITIVE (*)    Leukocytes,Ua LARGE (*)    All other components within normal limits  URINALYSIS, MICROSCOPIC (REFLEX) - Abnormal; Notable for the following components:   Bacteria, UA MANY (*)    All other components within normal limits  LIPASE, BLOOD    EKG None  Radiology CT ABDOMEN PELVIS W CONTRAST  Result Date: 08/03/2020 CLINICAL DATA:  Abdominal pain and vomiting. EXAM: CT ABDOMEN AND PELVIS WITH CONTRAST TECHNIQUE: Multidetector CT imaging of the abdomen and pelvis was performed using the standard protocol following bolus administration of intravenous contrast. CONTRAST:  168mL OMNIPAQUE IOHEXOL 300 MG/ML  SOLN COMPARISON:  July 18, 2019 FINDINGS: Lower chest: No acute abnormality. Hepatobiliary: No  focal liver abnormality is seen. An ill-defined 7 mm gallstone versus gallbladder polyp is seen within the lumen of a moderately distended gallbladder. There is no evidence of gallbladder wall  thickening or biliary dilatation. Pancreas: Unremarkable. No pancreatic ductal dilatation or surrounding inflammatory changes. Spleen: Normal in size without focal abnormality. Adrenals/Urinary Tract: Adrenal glands are unremarkable. Kidneys are normal in size, without renal calculi or hydronephrosis. A 1.0 cm cyst is seen along the anterolateral aspect of the mid right kidney. Mild anterior urinary bladder wall thickening is seen. 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 Appendix appears normal. No evidence of bowel wall thickening, distention, or inflammatory changes. Vascular/Lymphatic: Aortic atherosclerosis. No enlarged abdominal or pelvic lymph nodes. Reproductive: There is moderate to marked severity prostate gland enlargement. Other: No abdominal wall hernia or abnormality. No abdominopelvic ascites. Musculoskeletal: There is marked severity scoliosis involving the lumbar spine with marked severity multilevel degenerative changes. Extensive chronic changes are also seen at the level of L3-L4. IMPRESSION: 1. Large gastric hernia. 2. Subcentimeter gallstone versus gallbladder polyp. Correlation with right upper quadrant ultrasound is recommended. 3. Marked severity scoliosis with marked severity multilevel degenerative changes of the lumbar spine. 4. Enlarged prostate gland. 5. Aortic atherosclerosis. Aortic Atherosclerosis (ICD10-I70.0). Electronically Signed   By: Virgina Norfolk M.D.   On: 08/03/2020 20:54    Procedures Procedures (including critical care time)  Medications Ordered in ED Medications  ondansetron (ZOFRAN) injection 4 mg (4 mg Intravenous Given 08/03/20 1959)  fentaNYL (SUBLIMAZE) injection 50 mcg (50 mcg Intravenous Given 08/03/20 1959)  sodium chloride 0.9 % bolus 500 mL (0 mLs  Intravenous Stopped 08/03/20 2059)  iohexol (OMNIPAQUE) 300 MG/ML solution 100 mL (100 mLs Intravenous Contrast Given 08/03/20 2021)  cefTRIAXone (ROCEPHIN) 1 g in sodium chloride 0.9 % 100 mL IVPB ( Intravenous Stopped 08/03/20 2201)    ED Course  I have reviewed the triage vital signs and the nursing notes.  Pertinent labs & imaging results that were available during my care of the patient were reviewed by me and considered in my medical decision making (see chart for details).    MDM Rules/Calculators/A&P                         79 year old male presents to ER with concern for abdominal pain, nausea and vomiting.  On exam patient well-appearing with stable vital signs, afebrile.  Noted some tenderness in his lower abdomen.  Labs noted for urinary tract infection.  Otherwise grossly stable.  CT scan ordered to further assess.  Normal-appearing appendix.  Gallstone versus gallbladder polyp noted.  No evidence for gallbladder wall thickening or biliary dilatation.  Some bladder wall thickening.  Regarding the gallbladder findings, patient does not have any focal right upper quadrant pain.  Prior ultrasound 1 year ago demonstrated gallbladder sludge ball.  Given LFTs normal and no focal pain in this region, low suspicion that this finding is related to patient's acute presentation today.  Suspect most likely patient symptoms related to cystitis, urinary tract infection.  Treated with Rocephin, fluids, pain and nausea medicine.  Symptoms resolved, patient tolerating p.o. without difficulty.  At present believe he is appropriate for outpatient management.  Recommended close follow-up with his primary doctor for close recheck.  Discussed gallbladder findings with patient and offered to arrange for follow-up for imaging here however patient states he would prefer to follow-up with his primary doctor.  Additionally provided information for general surgery as well regarding his gallbladder.  Reviewed strict  return precautions and discharge patient home with daughter.  After the discussed management above, the patient was determined to be safe for discharge.  The patient was in agreement  with this plan and all questions regarding their care were answered.  ED return precautions were discussed and the patient will return to the ED with any significant worsening of condition.  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

## 2020-08-06 ENCOUNTER — Emergency Department (HOSPITAL_COMMUNITY): Payer: Medicare PPO

## 2020-08-06 ENCOUNTER — Other Ambulatory Visit: Payer: Self-pay

## 2020-08-06 ENCOUNTER — Encounter (HOSPITAL_COMMUNITY): Payer: Self-pay | Admitting: Emergency Medicine

## 2020-08-06 ENCOUNTER — Other Ambulatory Visit (HOSPITAL_COMMUNITY): Payer: Self-pay

## 2020-08-06 ENCOUNTER — Inpatient Hospital Stay (HOSPITAL_COMMUNITY)
Admission: EM | Admit: 2020-08-06 | Discharge: 2020-08-08 | DRG: 418 | Disposition: A | Discharge status: Home Health | Payer: Medicare PPO | Attending: Internal Medicine | Admitting: Internal Medicine

## 2020-08-06 DIAGNOSIS — Z7982 Long term (current) use of aspirin: Secondary | ICD-10-CM

## 2020-08-06 DIAGNOSIS — G894 Chronic pain syndrome: Secondary | ICD-10-CM

## 2020-08-06 DIAGNOSIS — Z20822 Contact with and (suspected) exposure to covid-19: Secondary | ICD-10-CM | POA: Diagnosis present

## 2020-08-06 DIAGNOSIS — E876 Hypokalemia: Secondary | ICD-10-CM | POA: Diagnosis present

## 2020-08-06 DIAGNOSIS — D472 Monoclonal gammopathy: Secondary | ICD-10-CM | POA: Diagnosis not present

## 2020-08-06 DIAGNOSIS — Z8249 Family history of ischemic heart disease and other diseases of the circulatory system: Secondary | ICD-10-CM

## 2020-08-06 DIAGNOSIS — R1011 Right upper quadrant pain: Secondary | ICD-10-CM

## 2020-08-06 DIAGNOSIS — E782 Mixed hyperlipidemia: Secondary | ICD-10-CM | POA: Diagnosis not present

## 2020-08-06 DIAGNOSIS — N1831 Chronic kidney disease, stage 3a: Secondary | ICD-10-CM | POA: Diagnosis present

## 2020-08-06 DIAGNOSIS — G8929 Other chronic pain: Secondary | ICD-10-CM | POA: Diagnosis present

## 2020-08-06 DIAGNOSIS — M47816 Spondylosis without myelopathy or radiculopathy, lumbar region: Secondary | ICD-10-CM | POA: Diagnosis present

## 2020-08-06 DIAGNOSIS — R1013 Epigastric pain: Secondary | ICD-10-CM | POA: Diagnosis not present

## 2020-08-06 DIAGNOSIS — M5440 Lumbago with sciatica, unspecified side: Secondary | ICD-10-CM | POA: Diagnosis not present

## 2020-08-06 DIAGNOSIS — R109 Unspecified abdominal pain: Secondary | ICD-10-CM | POA: Diagnosis present

## 2020-08-06 DIAGNOSIS — E860 Dehydration: Secondary | ICD-10-CM | POA: Diagnosis present

## 2020-08-06 DIAGNOSIS — I130 Hypertensive heart and chronic kidney disease with heart failure and stage 1 through stage 4 chronic kidney disease, or unspecified chronic kidney disease: Secondary | ICD-10-CM | POA: Diagnosis present

## 2020-08-06 DIAGNOSIS — Z87891 Personal history of nicotine dependence: Secondary | ICD-10-CM | POA: Diagnosis not present

## 2020-08-06 DIAGNOSIS — K8020 Calculus of gallbladder without cholecystitis without obstruction: Secondary | ICD-10-CM | POA: Diagnosis present

## 2020-08-06 DIAGNOSIS — M419 Scoliosis, unspecified: Secondary | ICD-10-CM | POA: Diagnosis present

## 2020-08-06 DIAGNOSIS — K8000 Calculus of gallbladder with acute cholecystitis without obstruction: Principal | ICD-10-CM | POA: Diagnosis present

## 2020-08-06 DIAGNOSIS — Z79899 Other long term (current) drug therapy: Secondary | ICD-10-CM

## 2020-08-06 DIAGNOSIS — Z66 Do not resuscitate: Secondary | ICD-10-CM | POA: Diagnosis present

## 2020-08-06 DIAGNOSIS — I712 Thoracic aortic aneurysm, without rupture: Secondary | ICD-10-CM | POA: Diagnosis present

## 2020-08-06 DIAGNOSIS — I252 Old myocardial infarction: Secondary | ICD-10-CM

## 2020-08-06 DIAGNOSIS — I429 Cardiomyopathy, unspecified: Secondary | ICD-10-CM | POA: Diagnosis present

## 2020-08-06 DIAGNOSIS — Z79891 Long term (current) use of opiate analgesic: Secondary | ICD-10-CM

## 2020-08-06 DIAGNOSIS — I5022 Chronic systolic (congestive) heart failure: Secondary | ICD-10-CM | POA: Diagnosis present

## 2020-08-06 DIAGNOSIS — E785 Hyperlipidemia, unspecified: Secondary | ICD-10-CM | POA: Diagnosis present

## 2020-08-06 DIAGNOSIS — N3000 Acute cystitis without hematuria: Secondary | ICD-10-CM | POA: Diagnosis present

## 2020-08-06 DIAGNOSIS — I2510 Atherosclerotic heart disease of native coronary artery without angina pectoris: Secondary | ICD-10-CM | POA: Diagnosis present

## 2020-08-06 DIAGNOSIS — M545 Low back pain, unspecified: Secondary | ICD-10-CM | POA: Diagnosis present

## 2020-08-06 DIAGNOSIS — G3184 Mild cognitive impairment, so stated: Secondary | ICD-10-CM | POA: Diagnosis present

## 2020-08-06 DIAGNOSIS — Z01810 Encounter for preprocedural cardiovascular examination: Secondary | ICD-10-CM

## 2020-08-06 DIAGNOSIS — N400 Enlarged prostate without lower urinary tract symptoms: Secondary | ICD-10-CM | POA: Diagnosis present

## 2020-08-06 HISTORY — DX: Calculus of gallbladder without cholecystitis without obstruction: K80.20

## 2020-08-06 HISTORY — DX: Chronic kidney disease, stage 3a: N18.31

## 2020-08-06 HISTORY — DX: Cystitis, unspecified without hematuria: N30.90

## 2020-08-06 LAB — URINALYSIS, ROUTINE W REFLEX MICROSCOPIC
Bacteria, UA: NONE SEEN
Bilirubin Urine: NEGATIVE
Glucose, UA: NEGATIVE mg/dL
Hgb urine dipstick: NEGATIVE
Ketones, ur: 20 mg/dL — AB
Nitrite: NEGATIVE
Protein, ur: NEGATIVE mg/dL
Specific Gravity, Urine: 1.023 (ref 1.005–1.030)
pH: 5 (ref 5.0–8.0)

## 2020-08-06 LAB — CBC
HCT: 35.3 % — ABNORMAL LOW (ref 39.0–52.0)
Hemoglobin: 12.4 g/dL — ABNORMAL LOW (ref 13.0–17.0)
MCH: 32.5 pg (ref 26.0–34.0)
MCHC: 35.1 g/dL (ref 30.0–36.0)
MCV: 92.4 fL (ref 80.0–100.0)
Platelets: 136 10*3/uL — ABNORMAL LOW (ref 150–400)
RBC: 3.82 MIL/uL — ABNORMAL LOW (ref 4.22–5.81)
RDW: 12.2 % (ref 11.5–15.5)
WBC: 5.3 10*3/uL (ref 4.0–10.5)
nRBC: 0 % (ref 0.0–0.2)

## 2020-08-06 LAB — COMPREHENSIVE METABOLIC PANEL
ALT: 17 U/L (ref 0–44)
AST: 33 U/L (ref 15–41)
Albumin: 4.2 g/dL (ref 3.5–5.0)
Alkaline Phosphatase: 90 U/L (ref 38–126)
Anion gap: 15 (ref 5–15)
BUN: 26 mg/dL — ABNORMAL HIGH (ref 8–23)
CO2: 18 mmol/L — ABNORMAL LOW (ref 22–32)
Calcium: 9.2 mg/dL (ref 8.9–10.3)
Chloride: 105 mmol/L (ref 98–111)
Creatinine, Ser: 1.39 mg/dL — ABNORMAL HIGH (ref 0.61–1.24)
GFR, Estimated: 51 mL/min — ABNORMAL LOW (ref >60)
Glucose, Bld: 108 mg/dL — ABNORMAL HIGH (ref 70–99)
Potassium: 3.8 mmol/L (ref 3.5–5.1)
Sodium: 138 mmol/L (ref 135–145)
Total Bilirubin: 1.4 mg/dL — ABNORMAL HIGH (ref 0.3–1.2)
Total Protein: 6.7 g/dL (ref 6.5–8.1)

## 2020-08-06 LAB — RESP PANEL BY RT-PCR (FLU A&B, COVID) ARPGX2
Influenza A by PCR: NEGATIVE
Influenza B by PCR: NEGATIVE
SARS Coronavirus 2 by RT PCR: NEGATIVE

## 2020-08-06 LAB — SURGICAL PCR SCREEN
MRSA, PCR: NEGATIVE
Staphylococcus aureus: NEGATIVE

## 2020-08-06 LAB — LIPASE, BLOOD: Lipase: 19 U/L (ref 11–51)

## 2020-08-06 IMAGING — US US ABDOMEN LIMITED
1 series · 13 of 25 positions shown · non-contrast
Comparison: [DATE] and CT [DATE]

CLINICAL DATA: 79-year-old with right upper quadrant pain.

EXAM:
ULTRASOUND ABDOMEN LIMITED RIGHT UPPER QUADRANT

[Series 1: us abdomen limited ruq (liver/gb) · 13 of 44 slices shown]
[im 1/44]
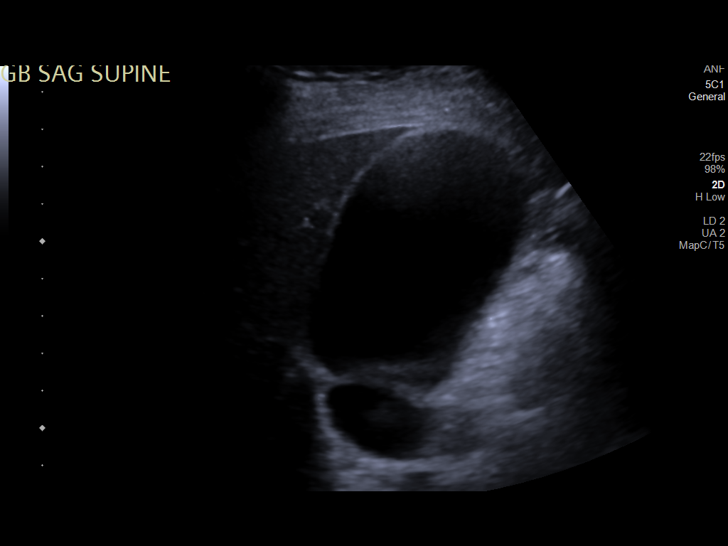
[im 4/44]
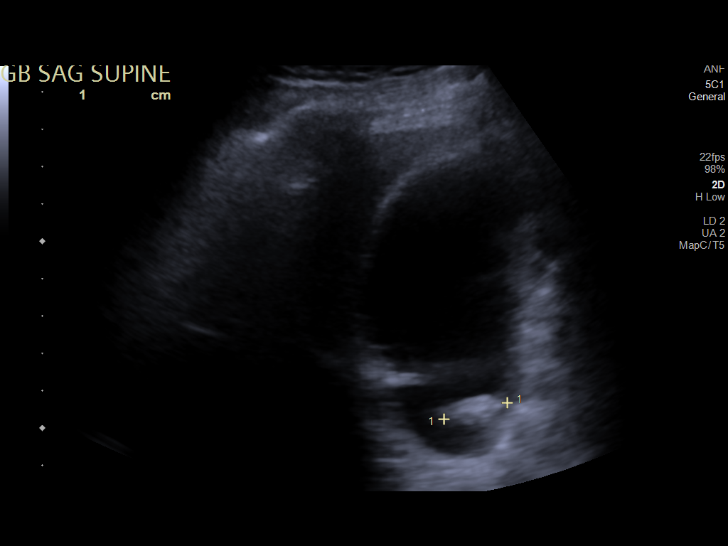
[im 8/44]
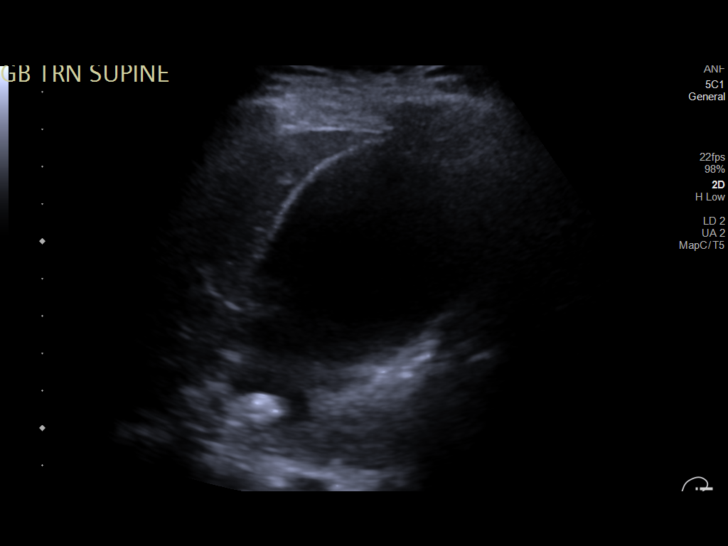
[im 11/44]
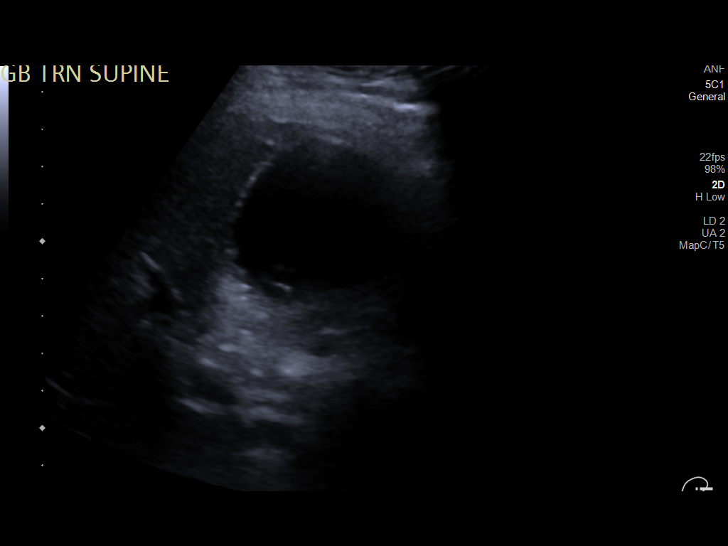
[im 15/44]
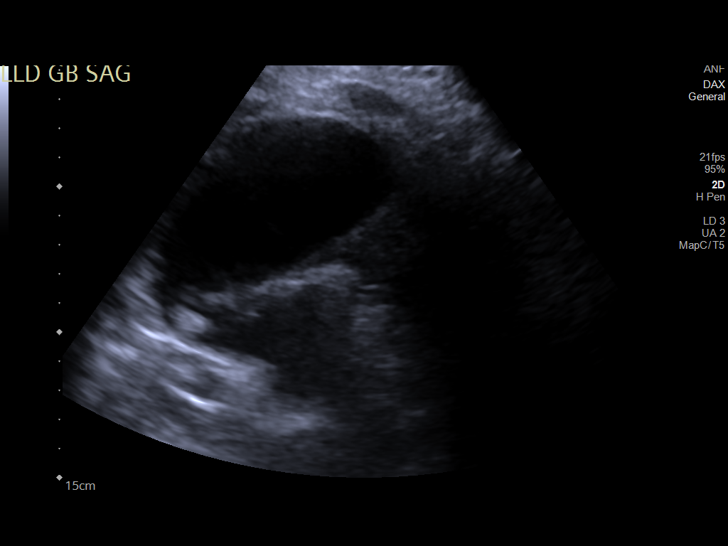
[im 18/44]
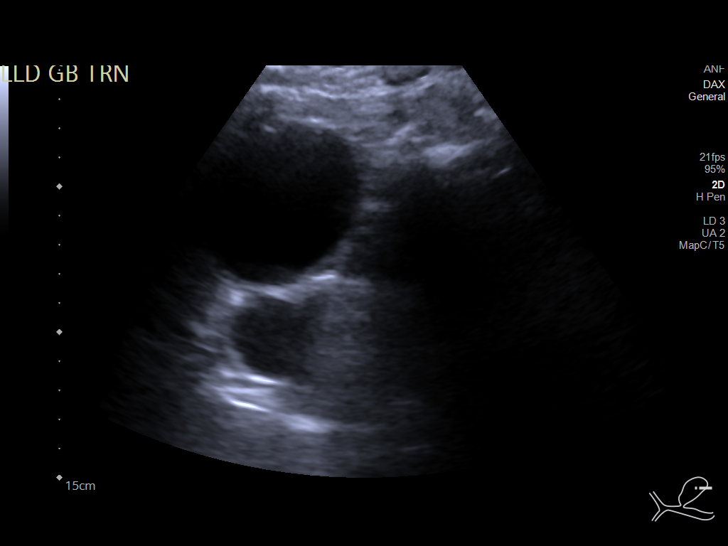
[im 22/44]
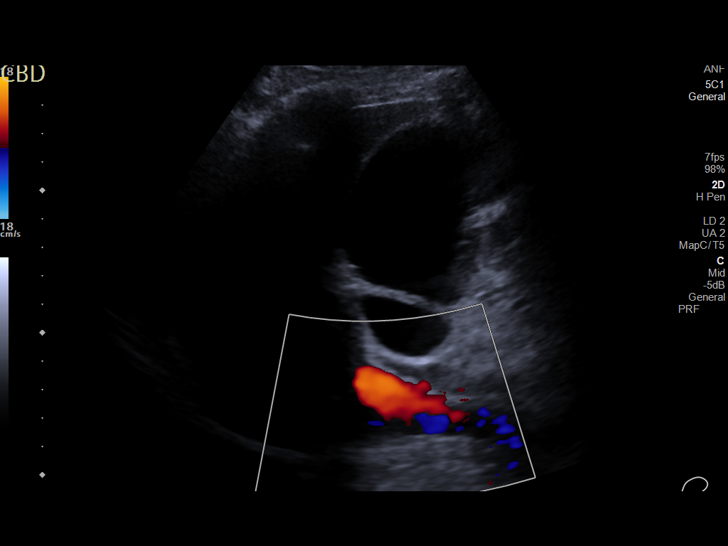
[im 26/44]
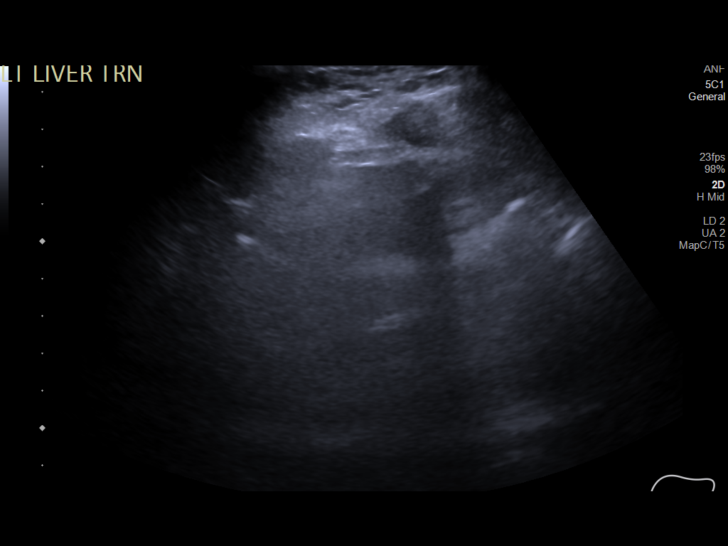
[im 29/44]
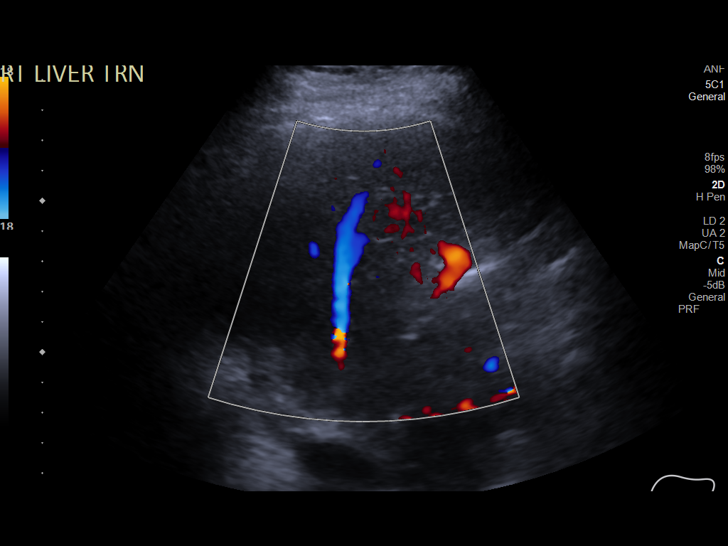
[im 33/44]
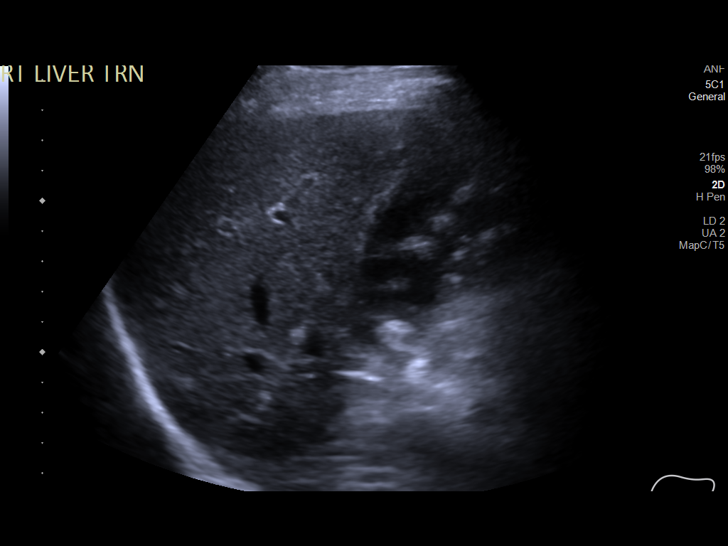
[im 36/44]
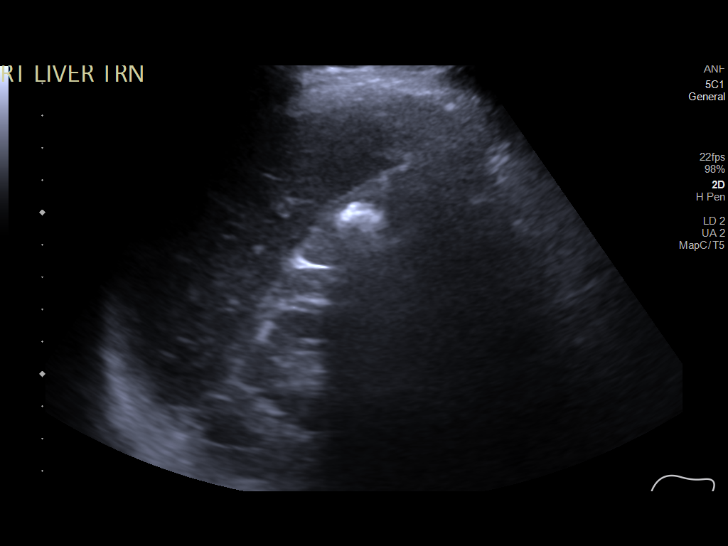
[im 40/44]
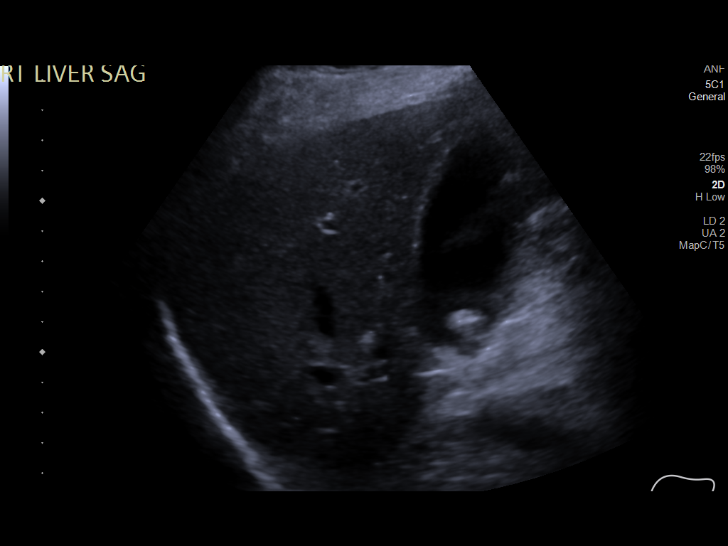
[im 44/44]
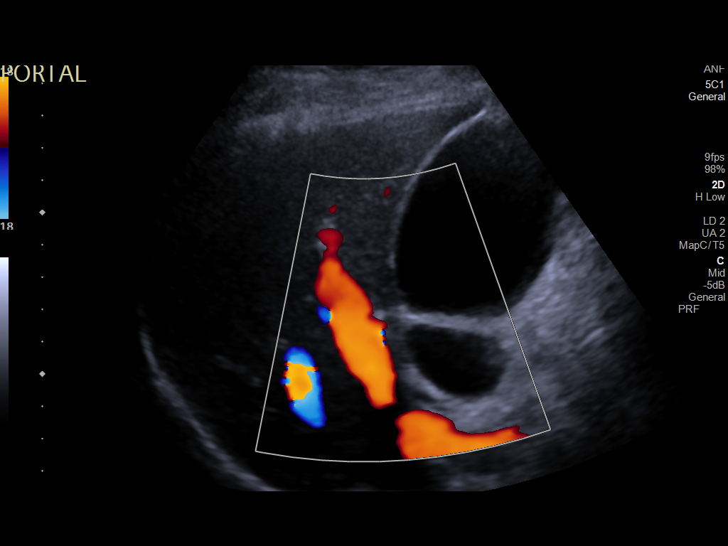

[13 of 25 positions shown; findings below may reference images not displayed]

FINDINGS: Gallbladder:

Mild to moderate distention of the gallbladder is similar to the
recent CT. No gallbladder wall thickening. Reportedly, the patient
does not have a sonographic Murphy sign. Non mobile and non
shadowing echogenic structure near the base of the gallbladder
measures up to 1.8 cm. There was a 0.9 cm echogenic structure in a
similar location on the exam from [DATE].

Common bile duct:

Diameter: 0.3 cm

Liver:

Limited evaluation of the liver due to bowel-gas. Liver is mildly
heterogeneous. No discrete liver lesion. Portal vein is patent on
color Doppler imaging with normal direction of blood flow towards
the liver.

Other: None.
IMPRESSION: 1. Stable distention of the gallbladder without wall thickening or
sonographic Murphy sign. No evidence for acute cholecystitis.
2. 1.8 cm non mobile and non shadowing echogenic structure in the
gallbladder. Differential diagnosis includes gallstone versus sludge
ball versus large polyp. Since a polyp cannot be excluded, recommend
surgical consultation.
3. No biliary dilatation.

## 2020-08-06 MED ORDER — OLANZAPINE 5 MG PO TABS
5.0000 mg | ORAL_TABLET | Freq: Every day | ORAL | Status: DC
  Administered 2020-08-07 – 2020-08-08 (×2): 5 mg via ORAL
  Filled 2020-08-06 (×2): qty 1

## 2020-08-06 MED ORDER — LACTATED RINGERS IV SOLN: INTRAVENOUS | Status: DC

## 2020-08-06 MED ORDER — PREGABALIN 75 MG PO CAPS
150.0000 mg | ORAL_CAPSULE | Freq: Two times a day (BID) | ORAL | Status: DC
  Administered 2020-08-06 – 2020-08-08 (×4): 150 mg via ORAL
  Filled 2020-08-06 (×4): qty 2

## 2020-08-06 MED ORDER — ALLOPURINOL 100 MG PO TABS
100.0000 mg | ORAL_TABLET | Freq: Every day | ORAL | Status: DC
  Administered 2020-08-07 – 2020-08-08 (×2): 100 mg via ORAL
  Filled 2020-08-06 (×2): qty 1

## 2020-08-06 MED ORDER — MORPHINE SULFATE (PF) 2 MG/ML IV SOLN
2.0000 mg | INTRAVENOUS | Status: DC | PRN
  Administered 2020-08-07 – 2020-08-08 (×3): 2 mg via INTRAVENOUS
  Filled 2020-08-06 (×3): qty 1

## 2020-08-06 MED ORDER — FENTANYL CITRATE (PF) 100 MCG/2ML IJ SOLN
50.0000 ug | Freq: Once | INTRAMUSCULAR | Status: AC
  Administered 2020-08-06: 13:00:00 50 ug via INTRAVENOUS
  Filled 2020-08-06: qty 2

## 2020-08-06 MED ORDER — SODIUM CHLORIDE 0.9 % IV BOLUS
500.0000 mL | Freq: Once | INTRAVENOUS | Status: AC
  Administered 2020-08-06: 13:00:00 500 mL via INTRAVENOUS

## 2020-08-06 MED ORDER — TRAZODONE HCL 50 MG PO TABS
50.0000 mg | ORAL_TABLET | Freq: Every day | ORAL | Status: DC
  Administered 2020-08-06 – 2020-08-07 (×2): 50 mg via ORAL
  Filled 2020-08-06 (×2): qty 1

## 2020-08-06 MED ORDER — ONDANSETRON HCL 4 MG/2ML IJ SOLN: 4.0000 mg | Freq: Four times a day (QID) | INTRAMUSCULAR | Status: DC | PRN

## 2020-08-06 MED ORDER — ONDANSETRON 4 MG PO TBDP: 4.0000 mg | ORAL_TABLET | Freq: Four times a day (QID) | ORAL | Status: DC | PRN

## 2020-08-06 MED ORDER — ACETAMINOPHEN 325 MG PO TABS: 650.0000 mg | ORAL_TABLET | Freq: Four times a day (QID) | ORAL | Status: DC | PRN

## 2020-08-06 MED ORDER — ATORVASTATIN CALCIUM 40 MG PO TABS
40.0000 mg | ORAL_TABLET | Freq: Every day | ORAL | Status: DC
  Administered 2020-08-06 – 2020-08-07 (×2): 40 mg via ORAL
  Filled 2020-08-06 (×2): qty 1

## 2020-08-06 MED ORDER — ONDANSETRON HCL 4 MG/2ML IJ SOLN
4.0000 mg | Freq: Once | INTRAMUSCULAR | Status: AC
  Administered 2020-08-06: 06:00:00 4 mg via INTRAVENOUS
  Filled 2020-08-06: qty 2

## 2020-08-06 MED ORDER — HYDROCODONE-ACETAMINOPHEN 5-325 MG PO TABS
1.0000 | ORAL_TABLET | Freq: Four times a day (QID) | ORAL | Status: DC
  Administered 2020-08-06 – 2020-08-08 (×6): 1 via ORAL
  Filled 2020-08-06 (×6): qty 1

## 2020-08-06 MED ORDER — FINASTERIDE 5 MG PO TABS
5.0000 mg | ORAL_TABLET | Freq: Every day | ORAL | Status: DC
  Administered 2020-08-07 – 2020-08-08 (×2): 5 mg via ORAL
  Filled 2020-08-06 (×2): qty 1

## 2020-08-06 MED ORDER — PANTOPRAZOLE SODIUM 40 MG PO TBEC
40.0000 mg | DELAYED_RELEASE_TABLET | Freq: Every day | ORAL | Status: DC
  Administered 2020-08-06 – 2020-08-07 (×2): 40 mg via ORAL
  Filled 2020-08-06 (×2): qty 1

## 2020-08-06 MED ORDER — SODIUM CHLORIDE 0.9 % IV SOLN
1.0000 g | Freq: Once | INTRAVENOUS | Status: AC
  Administered 2020-08-06: 13:00:00 1 g via INTRAVENOUS
  Filled 2020-08-06: qty 10

## 2020-08-06 MED ORDER — CARVEDILOL 6.25 MG PO TABS
6.2500 mg | ORAL_TABLET | Freq: Two times a day (BID) | ORAL | Status: DC
  Administered 2020-08-06 – 2020-08-08 (×3): 6.25 mg via ORAL
  Filled 2020-08-06 (×3): qty 1

## 2020-08-06 MED ORDER — ACETAMINOPHEN 650 MG RE SUPP: 650.0000 mg | Freq: Four times a day (QID) | RECTAL | Status: DC | PRN

## 2020-08-06 MED ORDER — HYDRALAZINE HCL 20 MG/ML IJ SOLN: 10.0000 mg | INTRAMUSCULAR | Status: DC | PRN

## 2020-08-06 MED ORDER — ONDANSETRON HCL 4 MG/2ML IJ SOLN
4.0000 mg | Freq: Once | INTRAMUSCULAR | Status: AC
  Administered 2020-08-06: 13:00:00 4 mg via INTRAVENOUS
  Filled 2020-08-06: qty 2

## 2020-08-06 MED ORDER — OXYCODONE HCL 5 MG PO TABS: 5.0000 mg | ORAL_TABLET | ORAL | Status: DC | PRN

## 2020-08-06 NOTE — Consult Note (Signed)
Cardiology Consultation:   Patient ID: Riley BRADWAY MRN: [id number]; DOB: 1941/03/13  Admit date: 08/06/2020 Date of Consult: 08/06/2020  Primary Care Provider: Aletha Halim., PA-C [facility] Cardiologist: Quay Burow, MD  [facility] Electrophysiologist:  None    Patient Profile:   Riley Eaton is a 79 y.o. male with a hx of presumed NICM, coronary artery disease, MGUS, CKD III, and BPH   who is being seen today for the evaluation of preoperative risk stratification at the request of Dr. Lorin Mercy.  History of Present Illness:   Riley Eaton was originally referred to Dr. Gwenlyn Found in Feb 2017 for evaluation of shortness of breath.  He was noted to have coronary calcifications on CT scan, however he did not have symptoms beyond shortness of breath of obstructive coronary disease.  A pharmacologic nuclear stress test was ordered, and was negative for stress-induced ischemia with normal left ventricular systolic function.  In late 2020 he was admitted for altered mental status, and during his evaluation was found to have an elevated troponin.  An echocardiogram at that time revealed new LV systolic dysfunction, with an ejection fraction of 30%.  At that time, he had significant renal dysfunction and left heart catheterization was deferred.  He has followed closely with Dr. Alvester Chou since, and given the absence of symptoms of obstructive coronary artery disease, he has not had any further evaluation for ischemic heart disease.  He was started on guideline directed medical therapy for his cardiomyopathy, and a repeat echocardiogram in August of this year revealed some improvement in his left ventricular systolic dysfunction.  Today Riley Eaton tells me that he is doing reasonably well from a cardiovascular standpoint.  He is not terribly active at home, but does note that it is his back pain that limits him from being as active as he would like.  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  When doing this work outside, he will occasionally get short of breath.  When he does become short of breath, he will sit down and this will resolve within a few minutes.  He does not become short of breath at rest and has not had any significant swelling recently.  He notes occasional chest pain although this typically occurs in the setting of epigastric/right upper quadrant abdominal pain.  He refers to this as his "indigestion" pain and feels it is related to his gallbladder.  There have been a few episodes where he has had similar discomfort when ambulating, but this is very rare.  He is having yearly daily episodes of right upper quadrant/epigastric discomfort almost always when eating.  His gallbladder issues have significantly impacted his quality of life, and he really feels that having his gallbladder taken out will make him feel much better.  Past Medical History:  Diagnosis Date  . Anxiety   . CHF (congestive heart failure) (HCC)    not in last 2.5 months  . Chronic back pain   . 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 Chronic, continuous use of opioids    for back pain  . Coronary artery calcification seen on CT scan   . Cystitis   . Depression   . Encephalopathy 07/14/2019  . Gallstones   . Goals of care, counseling/discussion 07/13/2020  . 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 Nerve pain   . Stage 3a chronic kidney disease ([place]) 08/06/2020  .  Thoracic aortic aneurysm [facility])     Past Surgical History:  Procedure Laterality Date  . BACK SURGERY     09-2016  . back surgey    . ENDOSCOPIC RETROGRADE CHOLANGIOPANCREATOGRAPHY (ERCP) WITH PROPOFOL N/A 09/25/2018   Procedure: ENDOSCOPIC RETROGRADE CHOLANGIOPANCREATOGRAPHY (ERCP) WITH PROPOFOL;  Surgeon: Ronnette Juniper, MD;  Location: [facility];  Service: Gastroenterology;  Laterality: N/A;  . INSERTION OF SUPRAPUBIC CATHETER N/A  10/26/2019   Procedure: INSERTION OF SUPRAPUBIC CATHETER;  Surgeon: Irine Seal, MD;  Location: [facility];  Service: Urology;  Laterality: N/A;  . PANCREATIC STENT PLACEMENT  09/25/2018   Procedure: PANCREATIC STENT PLACEMENT;  Surgeon: Ronnette Juniper, MD;  Location[facility] ENDOSCOPY;  Service: Gastroenterology;;  . REMOVAL OF STONES  09/25/2018   Procedure: REMOVAL OF STONES;  Surgeon: Ronnette Juniper, MD;  Location: [place];  Service: Gastroenterology;;  . [name]  09/25/2018   Procedure: [name];  Surgeon: Ronnette Juniper, MD;  Location: [facility] ENDOSCOPY;  Service: Gastroenterology;;  . TRANSURETHRAL RESECTION OF PROSTATE N/A 10/26/2019   Procedure: TRANSURETHRAL RESECTION OF THE PROSTATE (TURP);  Surgeon: Irine Seal, MD;  Location: [facility];  Service: Urology;  Laterality: N/A;     Home Medications:  Prior to Admission medications   Medication Sig Start Date End Date Taking? Authorizing Provider  allopurinol (ZYLOPRIM) 100 MG tablet TAKE 1 TABLET BY MOUTH EVERY DAY Patient taking differently: Take 100 mg by mouth daily. 03/16/19  Yes Kathrynn Ducking, MD  aspirin 81 MG chewable tablet Chew 1 tablet (81 mg total) by mouth daily. 08/02/19  Yes Swayze, Ava, DO  atorvastatin (LIPITOR) 40 MG tablet Take 1 tablet (40 mg total) by mouth daily at 6 PM. Patient taking differently: Take 40 mg by mouth at bedtime. 08/02/19  Yes Swayze, Ava, DO  carvedilol (COREG) 6.25 MG tablet Take 1 tablet by mouth 2 (two) times daily. 04/25/20  Yes [provider]  cephALEXin (KEFLEX) 500 MG capsule Take 1 capsule (500 mg total) by mouth 4 (four) times daily for 7 days. 08/03/20 08/10/20 Yes Dykstra, Ellwood Dense, MD  diphenhydramine-acetaminophen (TYLENOL PM) 25-500 MG TABS tablet Take 1 tablet by mouth at bedtime.   Yes [provider]  ferrous sulfate 325 (65 FE) MG EC tablet Take 325 mg by mouth daily. 08/30/19  Yes [provider]  finasteride (PROSCAR) 5 MG tablet Take 1 tablet (5 mg total) by  mouth daily. For urination 07/27/19  Yes Alexis Frock, MD  furosemide (LASIX) 40 MG tablet Take 1 tablet (40 mg total) by mouth daily. 07/13/20 10/11/20 Yes Lorretta Harp, MD  HYDROcodone-acetaminophen (NORCO/VICODIN) 5-325 MG tablet Take 1 tablet by mouth 4 (four) times daily.   Yes [provider]  losartan (COZAAR) 25 MG tablet Take 25 mg by mouth daily. 04/25/20  Yes [provider]  OLANZapine (ZYPREXA) 5 MG tablet Take 1 tablet (5 mg total) by mouth daily. 08/02/19  Yes Swayze, Ava, DO  ondansetron (ZOFRAN ODT) 4 MG disintegrating tablet Take 1 tablet (4 mg total) by mouth every 8 (eight) hours as needed for nausea or vomiting. 08/03/20  Yes Lucrezia Starch, MD  pantoprazole (PROTONIX) 40 MG tablet Take 1 tablet (40 mg total) by mouth at bedtime. Patient taking differently: Take 40 mg by mouth daily. 08/02/19  Yes Swayze, Ava, DO  polyethylene glycol (MIRALAX / GLYCOLAX) 17 g packet Take 17 g by mouth daily. Patient taking differently: Take 17 g by mouth daily as needed (constipation). 08/02/19  Yes Swayze, Ava, DO  pregabalin (  LYRICA) 150 MG capsule Take 150 mg by mouth 2 (two) times daily. 06/01/20  Yes [provider]  traZODone (DESYREL) 50 MG tablet Take 50 mg by mouth at bedtime. 05/17/19  Yes [provider]  vitamin B-12 (CYANOCOBALAMIN) 500 MCG tablet Take 500 mcg by mouth daily.   Yes [provider]    Inpatient Medications: Scheduled Meds: . [START ON 08/07/2020] allopurinol  100 mg Oral Daily  . atorvastatin  40 mg Oral QHS  . carvedilol  6.25 mg Oral BID  . [START ON 08/07/2020] finasteride  5 mg Oral Daily  . HYDROcodone-acetaminophen  1 tablet Oral QID  . [START ON 08/07/2020] OLANZapine  5 mg Oral Daily  . pantoprazole  40 mg Oral QHS  . pregabalin  150 mg Oral BID  . traZODone  50 mg Oral QHS   Continuous Infusions: . lactated ringers     PRN Meds: acetaminophen **OR** acetaminophen, hydrALAZINE, morphine  injection, ondansetron **OR** ondansetron (ZOFRAN) IV  Allergies:   No Known Allergies  Social History:   Social History   Socioeconomic History  . Marital status: Married    Spouse name: Not on file  . Number of children: 3  . Years of education: 93  . Highest education level: Not on file  Occupational History  . Occupation: retired  Tobacco Use  . Smoking status: Former Smoker    Packs/day: 1.00    Years: 20.00    Pack years: 20.00    Quit date: 1976    Years since quitting: 46.0  . Smokeless tobacco: Former Systems developer    Quit date: 12/12/1979  . Tobacco comment: quit 45 years ago  Vaping Use  . Vaping Use: Never used  Substance and Sexual Activity  . Alcohol use: No  . Drug use: No  . Sexual activity: Not on file  Other Topics Concern  . Not on file  Social History Narrative   Lives with wife   Caffeine use: Drinks 6 cups coffee per day   Right handed    Social Determinants of Health   Financial Resource Strain: Not on file  Food Insecurity: Not on file  Transportation Needs: Not on file  Physical Activity: Not on file  Stress: Not on file  Social Connections: Not on file  Intimate Partner Violence: Not on file    Family History:    Family History  Problem Relation Age of Onset  . Cancer Mother   . Heart attack Father      ROS:  Please see the history of present illness.   All other ROS reviewed and negative.     Physical Exam/Data:   Vitals:   08/06/20 0731 08/06/20 0908 08/06/20 1145 08/06/20 1705  BP: 121/89 112/78 127/85 (!) 156/94  Pulse: 78 81 74 64  Resp: 18 16 18 16   Temp:  (!) 97.5 F (36.4 C) 97.7 F (36.5 C) 97.7 F (36.5 C)  TempSrc:  Oral Oral Oral  SpO2: 97% 95% 97% 97%  Weight:      Height:        Intake/Output Summary (Last 24 hours) at 08/06/2020 1809 Last data filed at 08/06/2020 1413 Gross per 24 hour  Intake 600 ml  Output --  Net 600 ml   Last 3 Weights 08/06/2020 08/03/2020 07/13/2020  Weight (lbs) 187 lb 6.3 oz 172  lb 14.4 oz 179 lb  Weight (kg) 85 kg 78.427 kg 81.194 kg     Body mass index is 29.35 kg/m.  General:  Well nourished, well developed, in no acute distress HEENT: normal Lymph: no adenopathy Neck: no JVD Endocrine:  No thryomegaly Vascular: No carotid bruits; FA pulses 2+ bilaterally without bruits  Cardiac:  normal S1, S2; RRR; no murmur  Lungs:  clear to auscultation bilaterally, no wheezing, rhonchi or rales  Abd: soft, mildly tender in the RUQ/LUQ, no hepatomegaly  Ext: no edema Musculoskeletal:  No deformities, BUE and BLE strength normal and equal Skin: warm and dry  Neuro:  CNs 2-12 intact, no focal abnormalities noted Psych:  Normal affect   EKG:  The EKG was personally reviewed and demonstrates:  Sinus rhythm, 1st degree AVB, incomplete RBBB Telemetry:  Telemetry was personally reviewed and demonstrates:  N/A  Relevant CV Studies: TTE (03/2020): 1. Left ventricular ejection fraction, by estimation, is 40 to 45%. The  left ventricle has mildly decreased function. The left ventricle  demonstrates global hypokinesis. There is mild left ventricular  hypertrophy. Left ventricular diastolic parameters  are consistent with Grade I diastolic dysfunction (impaired relaxation).  2. Right ventricular systolic function is normal. The right ventricular  size is normal. There is normal pulmonary artery systolic pressure.  3. Left atrial size was mildly dilated.  4. The mitral valve is normal in structure. Trivial mitral valve  regurgitation. No evidence of mitral stenosis.  5. The aortic valve is normal in structure. Aortic valve regurgitation is  mild. Mild aortic valve sclerosis is present, with no evidence of aortic  valve stenosis.  6. Aortic dilatation noted. There is mild dilatation of the ascending  aorta measuring 41 mm.  7. The inferior vena cava is normal in size with greater than 50%  respiratory variability, suggesting right atrial pressure of 3 mmHg.   Nuc  (10/2015):  The left ventricular ejection fraction is normal (55-65%).  Nuclear stress EF: 57%.  There was no ST segment deviation noted during stress.  The study is normal.  This is a low risk study.  Laboratory Data:  High Sensitivity Troponin:  No results for input(s): TROPONINIHS in the last 720 hours.   Chemistry Recent Labs  Lab 08/03/20 1536 08/06/20 0422  NA 138 138  K 3.8 3.8  CL 103 105  CO2 26 18*  GLUCOSE 138* 108*  BUN 28* 26*  CREATININE 1.14 1.39*  CALCIUM 9.3 9.2  GFRNONAA >60 51*  ANIONGAP 9 15    Recent Labs  Lab 08/03/20 1536 08/06/20 0422  PROT 7.6 6.7  ALBUMIN 4.5 4.2  AST 26 33  ALT 15 17  ALKPHOS 95 90  BILITOT 1.1 1.4*   Hematology Recent Labs  Lab 08/03/20 1536 08/06/20 0422  WBC 7.0 5.3  RBC 3.98* 3.82*  HGB 13.0 12.4*  HCT 36.1* 35.3*  MCV 90.7 92.4  MCH 32.7 32.5  MCHC 36.0 35.1  RDW 12.2 12.2  PLT 153 136*   BNPNo results for input(s): BNP, PROBNP in the last 168 hours.  DDimer No results for input(s): DDIMER in the last 168 hours.   Radiology/Studies:  CT ABDOMEN PELVIS W CONTRAST  Result Date: 08/03/2020 CLINICAL DATA:  Abdominal pain and vomiting. EXAM: CT ABDOMEN AND PELVIS WITH CONTRAST TECHNIQUE: Multidetector CT imaging of the abdomen and pelvis was performed using the standard protocol following bolus administration of intravenous contrast. CONTRAST:  111mL OMNIPAQUE IOHEXOL 300 MG/ML  SOLN COMPARISON:  July 18, 2019 FINDINGS: Lower chest: No acute abnormality. Hepatobiliary: No focal liver abnormality is seen. An ill-defined 7 mm gallstone versus gallbladder polyp is seen within the lumen  of a moderately distended gallbladder. There is no evidence of gallbladder wall thickening or biliary dilatation. Pancreas: Unremarkable. No pancreatic ductal dilatation or surrounding inflammatory changes. Spleen: Normal in size without focal abnormality. Adrenals/Urinary Tract: Adrenal glands are unremarkable. Kidneys are  normal in size, without renal calculi or hydronephrosis. A 1.0 cm cyst is seen along the anterolateral aspect of the mid right kidney. Mild anterior urinary bladder wall thickening is seen. 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 Appendix appears normal. No evidence of bowel wall thickening, distention, or inflammatory changes. Vascular/Lymphatic: Aortic atherosclerosis. No enlarged abdominal or pelvic lymph nodes. Reproductive: There is moderate to marked severity prostate gland enlargement. Other: No abdominal wall hernia or abnormality. No abdominopelvic ascites. Musculoskeletal: There is marked severity scoliosis involving the lumbar spine with marked severity multilevel degenerative changes. Extensive chronic changes are also seen at the level of L3-L4. IMPRESSION: 1. Large gastric hernia. 2. Subcentimeter gallstone versus gallbladder polyp. Correlation with right upper quadrant ultrasound is recommended. 3. Marked severity scoliosis with marked severity multilevel degenerative changes of the lumbar spine. 4. Enlarged prostate gland. 5. Aortic atherosclerosis. Aortic Atherosclerosis (ICD10-I70.0). Electronically Signed   By: Virgina Norfolk M.D.   On: 08/03/2020 20:54   US Abdomen Limited RUQ (LIVER/GB)  Result Date: 08/06/2020 CLINICAL DATA:  79 year old with right upper quadrant pain. EXAM: ULTRASOUND ABDOMEN LIMITED RIGHT UPPER QUADRANT COMPARISON:  07/13/2019 and CT 08/03/2020 FINDINGS: Gallbladder: Mild to moderate distention of the gallbladder is similar to the recent CT. No gallbladder wall thickening. Reportedly, the patient does not have a sonographic Murphy sign. Non mobile and non shadowing echogenic structure near the base of the gallbladder measures up to 1.8 cm. There was a 0.9 cm echogenic structure in a similar location on the exam from 07/13/2019. Common bile duct: Diameter: 0.3 cm Liver: Limited evaluation of the liver due to bowel-gas. Liver is mildly heterogeneous. No discrete  liver lesion. Portal vein is patent on color Doppler imaging with normal direction of blood flow towards the liver. Other: None. IMPRESSION: 1. Stable distention of the gallbladder without wall thickening or sonographic Murphy sign. No evidence for acute cholecystitis. 2. 1.8 cm non mobile and non shadowing echogenic structure in the gallbladder. Differential diagnosis includes gallstone versus sludge ball versus large polyp. Since a polyp cannot be excluded, recommend surgical consultation. 3. No biliary dilatation. Electronically Signed   By: Markus Daft M.D.   On: 08/06/2020 13:40     Assessment and Plan:   Riley Eaton presents today with recurrent RUQ pain. He has a history of recurrent cholelithiasis/choledocholithiasis, and from a symptomatic standpoint, certainly seems to warrant surgical intervention. This is planned for tomorrow morning. Today, he appears to be reasonably well optimized. He appears euvolemic on exam with what seem like class II symptoms (assessment somewhat limited by his back pain). Riley Eaton has coronary artery disease (extensive calcifications on CT), but has not undergone an invasive evaluation due to prior renal dysfunction. He did undergo a pharmacologic stress in 2017 which was negative for stress induced ischemia. I am unclear as to the etiology of his cardiomyopathy. His function has improved with GDMT. There were no focal wall motion abnormalities previously.   Riley Eaton is at intermediate risk for perioperative cardiac events (RCRI II). Given his relatively poor functional status, I shared with Riley Eaton that we would normally recommend stress testing in this situation. I also shared with him that the results of this could potentially lead to more testing (ie LHC) w/ possible stenting.  This might further delay his surgery. He confirmed to me that he is DNR and hopes to minimize procedures/surgeries. I explained that absent this testing, we cannot properly risk stratify  him prior to surgery, and this may put him at higher risk for perioperative cardiac events. He would prefer to "just get the surgery." I do not think that proceeding with surgery (with no ischemic evaluation) is unreasonable, provided Riley Eaton accepts the potential increased risk associated with this strategy.   From an optimization standpoint, I would recommended continuing his BB + ARB. Given he has known coronary disease (with questionable cardiac symptoms), I would recommend continuing his aspirin as long as this does not pose a prohibitive surgical bleeding risk. He appears euvolemic and I agree with holding his diuretic. I would recommend checking a proBNP prior to surgical intervention.    New York Heart Association (NYHA) Functional Class NYHA Class II  [facility] will sign off.   Medication Recommendations:  Cont ASA, atorvastatin, coreg, losartan Other recommendations (labs, testing, etc):  Please check a proBNP Follow up as an outpatient:  As scheduled with Dr. Gwenlyn Found on 2/1  For questions or updates, please contact [facility] Please consult [email or web address] for contact info under   Signed, Ronaldo Miyamoto, MD  08/06/2020 6:09 PM

## 2020-08-06 NOTE — ED Notes (Addendum)
Patient not taken to floor and bed removed d/t inadequate staffing per floor CN.   Report had already been called.  ER CN made aware.

## 2020-08-06 NOTE — Consult Note (Signed)
Reason for Consult/Chief Complaint: abdominal pain Consultant: Ralene Bathe, MD  Riley Eaton is an 79 y.o. male.   HPI: 27M with a history of choledocholithiasis 09/2019 who underwent ERCP and sphincterotomy at that time by Dr. Therisa Doyne presents today with persistent RUQ abdominal pain that has been intermittently present for the last month and worse in the past two weeks. He reports symptoms of nausea and vomiting, but denies fever or chills. He localizes his pain to the right upper quadrant. He was scheduled as an outpatient with a surgeon at [facility] for cholecystectomy in March 2021, but has not followed up since then.  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   Reports only abdominal surgery was an SP catheter placed 01/2020 and removed 04/2020. He is unable to tell me why this was performed.   Past Medical History:  Diagnosis Date  . Anxiety   . CHF (congestive heart failure) (HCC)    not in last 2.5 months  . Chronic back pain   . 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 Chronic, continuous use of opioids    for back pain  . Coronary artery calcification seen on CT scan   . Cystitis   . Depression   . Encephalopathy 07/14/2019  . Gallstones   . Goals of care, counseling/discussion 07/13/2020  . 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 Myocardial infarction ([place]) 07/2019  . Nerve pain   . Thoracic aortic aneurysm [facility])     Past Surgical History:  Procedure Laterality Date  . BACK SURGERY     09-2016  . back surgey    . ENDOSCOPIC RETROGRADE CHOLANGIOPANCREATOGRAPHY (ERCP) WITH PROPOFOL N/A 09/25/2018   Procedure: ENDOSCOPIC RETROGRADE CHOLANGIOPANCREATOGRAPHY (ERCP) WITH PROPOFOL;  Surgeon: Ronnette Juniper, MD;  Location: [place];  Service: Gastroenterology;  Laterality: N/A;  . INSERTION OF SUPRAPUBIC CATHETER N/A 10/26/2019   Procedure:  INSERTION OF SUPRAPUBIC CATHETER;  Surgeon: Irine Seal, MD;  Location: [facility];  Service: Urology;  Laterality: N/A;  . PANCREATIC STENT PLACEMENT  09/25/2018   Procedure: PANCREATIC STENT PLACEMENT;  Surgeon: Ronnette Juniper, MD;  Location: [facility] ENDOSCOPY;  Service: Gastroenterology;;  . REMOVAL OF STONES  09/25/2018   Procedure: REMOVAL OF STONES;  Surgeon: Ronnette Juniper, MD;  Location: [place];  Service: Gastroenterology;;  . [name]  09/25/2018   Procedure: [name];  Surgeon: Ronnette Juniper, MD;  Location: [facility] ENDOSCOPY;  Service: Gastroenterology;;  . TRANSURETHRAL RESECTION OF PROSTATE N/A 10/26/2019   Procedure: TRANSURETHRAL RESECTION OF THE PROSTATE (TURP);  Surgeon: Irine Seal, MD;  Location: [facility];  Service: Urology;  Laterality: N/A;    Family History  Problem Relation Age of Onset  . Cancer Mother   . Heart attack Father     Social History:  reports that he has quit smoking. He quit smokeless tobacco use about 40 years ago. He reports that he does not drink alcohol and does not use drugs.  Allergies: No Known Allergies  Medications: I have reviewed the patient's current medications.  Results for orders placed or performed during the hospital encounter of 08/06/20 (from the past 48 hour(s))  Lipase, blood     Status: None   Collection Time: 08/06/20  4:22 AM  Result Value Ref Range   Lipase 19 11 - 51 U/L    Comment: Performed at [facility], [place] [address] [place], [place] [address]  Comprehensive metabolic panel     Status: Abnormal   Collection Time: 08/06/20  4:22 AM  Result Value Ref Range   Sodium 138 135 - 145 mmol/L   Potassium 3.8 3.5 - 5.1 mmol/L   Chloride 105 98 - 111 mmol/L   CO2 18 (L) 22 - 32 mmol/L   Glucose, Bld 108 (H) 70 - 99 mg/dL    Comment: Glucose reference range applies only to samples taken after fasting for at least 8 hours.   BUN 26 (H) 8 - 23 mg/dL   Creatinine, Ser 1.39 (H) 0.61 - 1.24 mg/dL   Calcium 9.2 8.9 - 10.3 mg/dL    Total Protein 6.7 6.5 - 8.1 g/dL   Albumin 4.2 3.5 - 5.0 g/dL   AST 33 15 - 41 U/L   ALT 17 0 - 44 U/L   Alkaline Phosphatase 90 38 - 126 U/L   Total Bilirubin 1.4 (H) 0.3 - 1.2 mg/dL   GFR, Estimated 51 (L) >60 mL/min    Comment: (NOTE) Calculated using the CKD-EPI Creatinine Equation (2021)    Anion gap 15 5 - 15    Comment: Performed at [place] [address], [place], [place] [address]  CBC     Status: Abnormal   Collection Time: 08/06/20  4:22 AM  Result Value Ref Range   WBC 5.3 4.0 - 10.5 K/uL   RBC 3.82 (L) 4.22 - 5.81 MIL/uL   Hemoglobin 12.4 (L) 13.0 - 17.0 g/dL   HCT 35.3 (L) 39.0 - 52.0 %   MCV 92.4 80.0 - 100.0 fL   MCH 32.5 26.0 - 34.0 pg   MCHC 35.1 30.0 - 36.0 g/dL   RDW 12.2 11.5 - 15.5 %   Platelets 136 (L) 150 - 400 K/uL    Comment: REPEATED TO VERIFY   nRBC 0.0 0.0 - 0.2 %    Comment: Performed at [facility], [place] [address] [place], [place] [address]  Urinalysis, Routine w reflex microscopic Urine, Clean Catch     Status: Abnormal   Collection Time: 08/06/20  1:26 PM  Result Value Ref Range   Color, Urine YELLOW YELLOW   APPearance HAZY (A) CLEAR   Specific Gravity, Urine 1.023 1.005 - 1.030   pH 5.0 5.0 - 8.0   Glucose, UA NEGATIVE NEGATIVE mg/dL   Hgb urine dipstick NEGATIVE NEGATIVE   Bilirubin Urine NEGATIVE NEGATIVE   Ketones, ur 20 (A) NEGATIVE mg/dL   Protein, ur NEGATIVE NEGATIVE mg/dL   Nitrite NEGATIVE NEGATIVE   Leukocytes,Ua SMALL (A) NEGATIVE   RBC / HPF 0-5 0 - 5 RBC/hpf   WBC, UA 11-20 0 - 5 WBC/hpf   Bacteria, UA NONE SEEN NONE SEEN   Squamous Epithelial / LPF 0-5 0 - 5   Mucus PRESENT    Hyaline Casts, UA PRESENT     Comment: Performed at [facility], [place] [address] [place], [place] [address]    US Abdomen Limited RUQ (LIVER/GB)  Result Date: 08/06/2020 CLINICAL DATA:  79 year old with right upper quadrant pain. EXAM: ULTRASOUND ABDOMEN LIMITED RIGHT UPPER QUADRANT COMPARISON:  07/13/2019 and CT  08/03/2020 FINDINGS: Gallbladder: Mild to moderate distention of the gallbladder is similar to the recent CT. No gallbladder wall thickening. Reportedly, the patient does not have a sonographic Murphy sign. Non mobile and non shadowing echogenic structure near the base of the gallbladder measures up to 1.8 cm. There was a 0.9 cm echogenic structure in a similar location  on the exam from 07/13/2019. Common bile duct: Diameter: 0.3 cm Liver: Limited evaluation of the liver due to bowel-gas. Liver is mildly heterogeneous. No discrete liver lesion. Portal vein is patent on color Doppler imaging with normal direction of blood flow towards the liver. Other: None. IMPRESSION: 1. Stable distention of the gallbladder without wall thickening or sonographic Murphy sign. No evidence for acute cholecystitis. 2. 1.8 cm non mobile and non shadowing echogenic structure in the gallbladder. Differential diagnosis includes gallstone versus sludge ball versus large polyp. Since a polyp cannot be excluded, recommend surgical consultation. 3. No biliary dilatation. Electronically Signed   By: Markus Daft M.D.   On: 08/06/2020 13:40    ROS 10 point review of systems is negative except as listed above in HPI.   Physical Exam Blood pressure 127/85, pulse 74, temperature 97.7 F (36.5 C), temperature source Oral, resp. rate 18, height _0  (1.702 m), weight 85 kg, SpO2 97 %. Constitutional: well-developed, well-nourished HEENT: pupils equal, round, reactive to light, 35m b/l, moist conjunctiva, external inspection of ears and nose normal, hearing intact Oropharynx: normal oropharyngeal mucosa, normal dentition Neck: no thyromegaly, trachea midline, no midline cervical tenderness to palpation Chest: breath sounds equal bilaterally, normal respiratory effort, no midline or lateral chest wall tenderness to palpation/deformity Abdomen: soft, RUQ TTP, well-healed prior  cath site, no bruising, no hepatosplenomegaly GU: no blood at  urethral meatus of penis, no scrotal masses or abnormality  Back: no wounds, no thoracic/lumbar spine tenderness to palpation, no thoracic/lumbar spine stepoffs Rectal: deferred Extremities: 2+ radial and pedal pulses bilaterally, motor and sensation intact to bilateral UE and LE, no peripheral edema MSK: unable to assess gait/station, no clubbing/cyanosis of fingers/toes, normal ROM of all four extremities Skin: warm, dry, no rashes Psych: normal memory, normal mood/affect    Assessment/Plan: 67M with a history of choledocholithiasis in 09/2019 and persistant RUQ abdominal pain.  Recommend lap chole. Informed consent was obtained after detailed explanation of risks, including bleeding, infection, biloma, need for IOC to delineate anatomy, and need for conversion to open procedure. All questions answered to the patient's satisfaction. Patient to be admitted to internal medicine service. Recommend pre-operative risk stratification/optimization by cardiology and this was communicated to the primary team. NPO at midnight.     AJesusita Oka MD [facility] and [facility]

## 2020-08-06 NOTE — ED Notes (Addendum)
Patient moved to H012 in ER   VSS  No issues noted

## 2020-08-06 NOTE — ED Notes (Signed)
Attempted to give report, asked to call back in 15 min

## 2020-08-06 NOTE — ED Notes (Signed)
Consents signed at bedside

## 2020-08-06 NOTE — H&P (Signed)
History and Physical    Riley Eaton DQQ:[id number] DOB: 03-Oct-1940 DOA: 08/06/2020  PCP: Aletha Halim., PA-C Consultants:  Marin Olp - oncology; Gwenlyn Found - cardiology; Jeffie Pollock - urology Patient coming from: Home - lives alone; NOK: Daughter, Sharyn Lull, [phone]  Chief Complaint: Abdominal pain  HPI: MONTERIUS ROLF is a 79 y.o. male with medical history significant of MGUS; CAD; thoracic aortic aneurysm; CHF; BPH s/p TURP; and chronic pain presenting with abdominal pain.  He has been feeling nauseated for about 4-5 days.  He went to [facility] Thursday and they gave him antibiotics and a nausea pill.  He didn't seem to notice much improvement.  The last 2 nights he was very nauseated without emesis.  He was having upper abdominal pain.  Nothing made it better or worse.  No fever.  He has had anorexia.  He is tolerating water.  2 years ago, he had cholecystitis; they removed gallstones but they couldn't get his BP under good control and decided to do it as an outpatient.  COVID happened after that; his BP improved but surgeries weren't happening.He took 4 antibiotics pills Friday and Saturday.  He also had Rocephin at [facility] and today.  He was not noticing urinary symptoms.  He had a foley followed by a suprapubic catheter but has not had that in about 3 months.     ED Course:  H/o gallstones, abdominal pain and nausea x 1 week.  Seen at [facility] - UTI, given Keflex.  Worsening nausea, can't tolerate Keflex, worsening pain.  Generalized discomfort, nothing focal.  RUQ [place] with stone vs. Polyp - needs surgical consultation.  Surgery to see, recommends HIDA, no cholecystitis.  Given Rocephin.  Review of Systems: As per HPI; otherwise review of systems reviewed and negative.   Ambulatory Status:  Ambulates with a cane or walker  COVID Vaccine Status:  Complete plus booster  Past Medical History:  Diagnosis Date  . Anxiety   . CHF (congestive heart failure) (HCC)    not in last 2.5 months  .  Chronic back pain   . 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 Chronic, continuous use of opioids    for back pain  . Coronary artery calcification seen on CT scan   . Cystitis   . Depression   . Encephalopathy 07/14/2019  . Gallstones   . Goals of care, counseling/discussion 07/13/2020  . 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 Myocardial infarction ([place]) 07/2019  . Nerve pain   . Stage 3a chronic kidney disease ([place]) 08/06/2020  . Thoracic aortic aneurysm [facility])     Past Surgical History:  Procedure Laterality Date  . BACK SURGERY     09-2016  . back surgey    . ENDOSCOPIC RETROGRADE CHOLANGIOPANCREATOGRAPHY (ERCP) WITH PROPOFOL N/A 09/25/2018   Procedure: ENDOSCOPIC RETROGRADE CHOLANGIOPANCREATOGRAPHY (ERCP) WITH PROPOFOL;  Surgeon: Ronnette Juniper, MD;  Location: [place];  Service: Gastroenterology;  Laterality: N/A;  . INSERTION OF SUPRAPUBIC CATHETER N/A 10/26/2019   Procedure: INSERTION OF SUPRAPUBIC CATHETER;  Surgeon: Irine Seal, MD;  Location: [facility];  Service: Urology;  Laterality: N/A;  . PANCREATIC STENT PLACEMENT  09/25/2018   Procedure: PANCREATIC STENT PLACEMENT;  Surgeon: Ronnette Juniper, MD;  Location: [facility] ENDOSCOPY;  Service: Gastroenterology;;  . REMOVAL OF STONES  09/25/2018   Procedure: REMOVAL OF STONES;  Surgeon: Ronnette Juniper, MD;  Location: [facility] ENDOSCOPY;  Service: Gastroenterology;;  . [name]  09/25/2018   Procedure: [name];  Surgeon: Ronnette Juniper, MD;  Location: [facility]  ENDOSCOPY;  Service: Gastroenterology;;  . TRANSURETHRAL RESECTION OF PROSTATE N/A 10/26/2019   Procedure: TRANSURETHRAL RESECTION OF THE PROSTATE (TURP);  Surgeon: Irine Seal, MD;  Location: [facility];  Service: Urology;  Laterality: N/A;    Social History   Socioeconomic History  . Marital status: Married    Spouse name: Not on file  . Number of children: 3  . Years of education: 50  . Highest education level: Not on file  Occupational History  . Occupation:  retired  Tobacco Use  . Smoking status: Former Smoker    Packs/day: 1.00    Years: 20.00    Pack years: 20.00    Quit date: 1976    Years since quitting: 46.0  . Smokeless tobacco: Former Systems developer    Quit date: 12/12/1979  . Tobacco comment: quit 45 years ago  Vaping Use  . Vaping Use: Never used  Substance and Sexual Activity  . Alcohol use: No  . Drug use: No  . Sexual activity: Not on file  Other Topics Concern  . Not on file  Social History Narrative   Lives with wife   Caffeine use: Drinks 6 cups coffee per day   Right handed    Social Determinants of Health   Financial Resource Strain: Not on file  Food Insecurity: Not on file  Transportation Needs: Not on file  Physical Activity: Not on file  Stress: Not on file  Social Connections: Not on file  Intimate Partner Violence: Not on file    No Known Allergies  Family History  Problem Relation Age of Onset  . Cancer Mother   . Heart attack Father     Prior to Admission medications   Medication Sig Start Date End Date Taking? Authorizing Provider  allopurinol (ZYLOPRIM) 100 MG tablet TAKE 1 TABLET BY MOUTH EVERY DAY Patient taking differently: Take 100 mg by mouth daily. 03/16/19  Yes Kathrynn Ducking, MD  aspirin 81 MG chewable tablet Chew 1 tablet (81 mg total) by mouth daily. 08/02/19  Yes Swayze, Ava, DO  atorvastatin (LIPITOR) 40 MG tablet Take 1 tablet (40 mg total) by mouth daily at 6 PM. Patient taking differently: Take 40 mg by mouth at bedtime. 08/02/19  Yes Swayze, Ava, DO  carvedilol (COREG) 6.25 MG tablet Take 1 tablet by mouth 2 (two) times daily. 04/25/20  Yes [provider]  cephALEXin (KEFLEX) 500 MG capsule Take 1 capsule (500 mg total) by mouth 4 (four) times daily for 7 days. 08/03/20 08/10/20 Yes Dykstra, Ellwood Dense, MD  diphenhydramine-acetaminophen (TYLENOL PM) 25-500 MG TABS tablet Take 1 tablet by mouth at bedtime.   Yes [provider]  ferrous sulfate 325 (65 FE) MG EC tablet  Take 325 mg by mouth daily. 08/30/19  Yes [provider]  finasteride (PROSCAR) 5 MG tablet Take 1 tablet (5 mg total) by mouth daily. For urination 07/27/19  Yes Alexis Frock, MD  furosemide (LASIX) 40 MG tablet Take 1 tablet (40 mg total) by mouth daily. 07/13/20 10/11/20 Yes Lorretta Harp, MD  HYDROcodone-acetaminophen (NORCO/VICODIN) 5-325 MG tablet Take 1 tablet by mouth 4 (four) times daily.   Yes [provider]  losartan (COZAAR) 25 MG tablet Take 25 mg by mouth daily. 04/25/20  Yes [provider]  OLANZapine (ZYPREXA) 5 MG tablet Take 1 tablet (5 mg total) by mouth daily. 08/02/19  Yes Swayze, Ava, DO  ondansetron (ZOFRAN ODT) 4 MG disintegrating tablet Take 1 tablet (4 mg total) by mouth every  8 (eight) hours as needed for nausea or vomiting. 08/03/20  Yes Lucrezia Starch, MD  pantoprazole (PROTONIX) 40 MG tablet Take 1 tablet (40 mg total) by mouth at bedtime. Patient taking differently: Take 40 mg by mouth daily. 08/02/19  Yes Swayze, Ava, DO  polyethylene glycol (MIRALAX / GLYCOLAX) 17 g packet Take 17 g by mouth daily. Patient taking differently: Take 17 g by mouth daily as needed (constipation). 08/02/19  Yes Swayze, Ava, DO  pregabalin (LYRICA) 150 MG capsule Take 150 mg by mouth 2 (two) times daily. 06/01/20  Yes [provider]  traZODone (DESYREL) 50 MG tablet Take 50 mg by mouth at bedtime. 05/17/19  Yes [provider]  vitamin B-12 (CYANOCOBALAMIN) 500 MCG tablet Take 500 mcg by mouth daily.   Yes [provider]  cloNIDine (CATAPRES - DOSED IN MG/24 HR) 0.1 mg/24hr patch Place 1 patch (0.1 mg total) onto the skin once a week. Patient not taking: No sig reported 08/02/19   Karie Kirks, DO    Physical Exam: Vitals:   08/06/20 0731 08/06/20 0908 08/06/20 1145 08/06/20 1705  BP: 121/89 112/78 127/85 (!) 156/94  Pulse: 78 81 74 64  Resp: 18 16 18 16   Temp:  (!) 97.5 F (36.4 C) 97.7 F (36.5 C) 97.7 F (36.5 C)   TempSrc:  Oral Oral Oral  SpO2: 97% 95% 97% 97%  Weight:      Height:         . General:  Appears calm and comfortable and is in NAD . Eyes:   EOMI, normal lids, iris . ENT:  grossly normal hearing, lips & tongue, mmm; appropriate dentition . Neck:  no LAD, masses or thyromegaly . Cardiovascular:  RRR, no m/r/g. No LE edema.  [name] Respiratory:   CTA bilaterally with no wheezes/rales/rhonchi.  Normal respiratory effort. . Abdomen:  soft, diffusely tender but clearly worse in upper abdomen and even worse in RUQ, ND . Skin:  no rash or induration seen on limited exam . Musculoskeletal:  grossly normal tone BUE/BLE, good ROM, no bony abnormality . Psychiatric:  grossly normal mood and affect, speech fluent and appropriate, AOx3, ?mild cognitive impairment . Neurologic:  CN 2-12 grossly intact, moves all extremities in coordinated fashion    Radiological Exams on Admission: Independently reviewed - see discussion in A/P where applicable  US Abdomen Limited RUQ (LIVER/GB)  Result Date: 08/06/2020 CLINICAL DATA:  79 year old with right upper quadrant pain. EXAM: ULTRASOUND ABDOMEN LIMITED RIGHT UPPER QUADRANT COMPARISON:  07/13/2019 and CT 08/03/2020 FINDINGS: Gallbladder: Mild to moderate distention of the gallbladder is similar to the recent CT. No gallbladder wall thickening. Reportedly, the patient does not have a sonographic Murphy sign. Non mobile and non shadowing echogenic structure near the base of the gallbladder measures up to 1.8 cm. There was a 0.9 cm echogenic structure in a similar location on the exam from 07/13/2019. Common bile duct: Diameter: 0.3 cm Liver: Limited evaluation of the liver due to bowel-gas. Liver is mildly heterogeneous. No discrete liver lesion. Portal vein is patent on color Doppler imaging with normal direction of blood flow towards the liver. Other: None. IMPRESSION: 1. Stable distention of the gallbladder without wall thickening or sonographic Murphy sign. No  evidence for acute cholecystitis. 2. 1.8 cm non mobile and non shadowing echogenic structure in the gallbladder. Differential diagnosis includes gallstone versus sludge ball versus large polyp. Since a polyp cannot be excluded, recommend surgical consultation. 3. No biliary dilatation. Electronically Signed   By:  Markus Daft M.D.   On: 08/06/2020 13:40    EKG: Independently reviewed.  NSR with rate 74; incomplete RBBB with no evidence of acute ischemia   Labs on Admission: I have personally reviewed the available labs and imaging studies at the time of the admission.  Pertinent labs:   CO2 18 Glucose 108 BUN 26/Creatinine 1.39/GFR 51 - stable Bili 1.4 Unremarkable CBC UA: 20 ketones, small LE; on 12/23, 20 ketones, large LE, +nitrite, many bacteria Urine culture pending   Assessment/Plan Active Problems:   Coronary artery calcification seen on CT scan   Chronic low back pain   Abdominal pain   Hyperlipidemia   MGUS (monoclonal gammopathy of unknown significance)   Chronic systolic CHF (congestive heart failure) (HCC)   Stage 3a chronic kidney disease (HCC)   Abdominal pain due to cholelithiasis -Patient was admitted in 2.2020 with ascending cholangitis requiring sphincterotomy with stenting of the pancreatic duct; he was planned for outpatient cholecystectomy but this was postponed due to the [place] pandemic -He presented again in 07/2019 with the same issue and was again discharged with plan for outpatient surgery f/u -He returned to [facility] on 12/23 with abdominal pain and emesis and CT showed gallstone vs. Polyp; however, symptoms were thought to be related to cystitis rather than gallstones and he was discharged -He has continued to have pain, refractory nausea, and anorexia -[place] today shows a 1.8 cm mobile and non-shadowing echogenic structure in the gallbladder - gallstone vs. Sludge ball vs. Large polyp -HIDA scan was considered but given recurrent episodic abdominal pain, he  appears to need cholecystectomy at this time -Surgery has been consulted and the patient is posted for lap chole tomorrow at 0815 -Clear liquids, NPO after midnight -Morphine for pain, Zofran for nausea -No current evidence of infection; will not continue antibiotics currently  Recent UTI -Patient was seen at [facility] on 12/23 and empirically treated with Rocephin and then Keflex; culture was not obtained -He denies urinary symptoms then or now -Currently with ketonuria associated with decreased PO intake -He has received 2 doses of Rocephin (including 1 today) and 2 days of Keflex -This is likely adequate treatment for uncomplicated UTI, if present -Will not continue antibiotics at this time  BPH -Prior urinary obstruction -He required long-term foley and then suprapubic catheter -He also had TURP procedure -He has since resumed regular voiding and no longer has a urinary catheter -Continue Proscar  MGUS -Confirmed on bone marrow biopsy on 11/11 -Followed by Dr. Marin Olp -Surveillance only at this time  CAD/Chronic combined CHF -03/20/20 echo with EF 40-45% and grade 1 diastolic dysfunction -Appears to be compensated at this time, no complaint of chest pain -Pre-op cardiology clearance requested by surgery; Dr. Clayton Bibles requested to see overnight (sent Secure Chat and text page) -Hold ASA until after surgery -Hold Lasix for now - has ketonuria suggestive of dehydration  HLD -Continue Lipitor  HTN -Continue Coreg -Hold Cozaar, resume post-operatively  Stage 3a CKD -Appears to be stable at this time -Attempt to avoid nephrotoxic medications  Chronic pain -I have reviewed this patient in the [place] Controlled Substances Reporting System.  He is receiving medications from only one provider and appears to be taking them as prescribed. -He is at significantly increased risk of opioid misuse, diversion, or overdose. 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 The patient has been fully vaccinated against COVID-19.    DVT prophylaxis:  SCDs Code Status:  DNR - confirmed with patient/family Family Communication: Son was present throughout evaluation Disposition Plan:  The patient is from: home  Anticipated d/c is to: home without [facility] services   Anticipated d/c date will depend on clinical response to treatment, likely post-operatively  Patient is currently: acutely ill Consults called: Surgery; cardiology  Admission status:  Admit - It is my clinical opinion that admission to INPATIENT is reasonable and necessary because of the expectation that this patient will require hospital care that crosses at least 2 midnights to treat this condition based on the medical complexity of the problems presented.  Given the aforementioned information, the predictability of an adverse outcome is felt to be significant.    Karmen Bongo MD [facility]   How to contact the [facility] Attending or Consulting provider [place] or covering provider during after hours [place], for this patient?  1. Check the care team in [facility] and look for a) attending/consulting [facility] provider listed and b) the [facility] team listed 2. Log into [email or web address] and use [place]'s universal password to access. If you do not have the password, please contact the hospital operator. 3. Locate the [facility] provider you are looking for under [facility] and page to a number that you can be directly reached. 4. If you still have difficulty reaching the provider, please page the [facility] (Director on Call) for the Hospitalists listed on  amion for assistance.   08/06/2020, 5:15 PM

## 2020-08-06 NOTE — ED Triage Notes (Addendum)
Patient reports persistent low abdominal pain onset Thursday this week with emesis , seen at [place] 08/03/20 diagnosed with gallstones/cystitis , no fever or chills , respirations unlabored /no chest pain . He received Zofran 4 mg IV and Fentanyl 100 mcg IV by EMS with relief.

## 2020-08-06 NOTE — ED Notes (Signed)
Returned from [facility]

## 2020-08-06 NOTE — ED Provider Notes (Signed)
[facility] EMERGENCY DEPARTMENT Provider Note   CSN: [id number] Arrival date & time: 08/06/20  0411     History Chief Complaint  Patient presents with  . Abdominal Pain    Riley Eaton is a 79 y.o. male.  The history is provided by the patient and medical records.  Abdominal Pain  Riley Eaton is a 79 y.o. male who presents to the Emergency Department complaining of abdominal pain and gallbladder problem. He presents the emergency department stating that he needs his gallbladder removed. He reports four days of central and generalized abdominal pain. Pain is described as constant in nature and nonradiating. He has associated nausea. He did have vomiting four days ago, now resolved. He was evaluated in the med center [facility] emergency department on December 23 where he had a CT abdomen pelvis performed as well as labs. His urinalysis was consistent with urinary tract infection and he was treated with one dose of Rocephin IV as well as Keflex BID. He has been taking his meds since ED discharge. He states that his last dose of keflex was last night. He states that early last night/this morning he developed severe worsening of his nausea and he did not take any additional medications. He does take Norco for chronic back pain and states that this does not help his abdominal pain. He has no dysuria, chest pain, shortness of breath. No significant change in his pain with food but he is unable to eat for the last three days secondary to nausea.    Past Medical History:  Diagnosis Date  . Anxiety   . CHF (congestive heart failure) (HCC)    not in last 2.5 months  . Chronic back pain   . 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 Chronic, continuous use of opioids    for back pain  . Coronary artery calcification seen on CT scan   . Cystitis   . Depression   . Encephalopathy 07/14/2019  . Gallstones   . Goals of care, counseling/discussion 07/13/2020  . 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 Myocardial infarction ([place]) 07/2019  . Nerve pain   . Thoracic aortic aneurysm [facility])     Patient Active Problem List   Diagnosis Date Noted  . 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 Goals of care, counseling/discussion 07/13/2020  . 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 Hyperlipidemia 08/31/2019  . Non-ST elevation (NSTEMI) myocardial infarction ([place])   . Hypokalemia   . DCM (dilated cardiomyopathy) ([place])   . Abdominal pain   . Encephalopathy   . Endotracheal tube present   . Alteration in nutrition   . ACS (acute coronary syndrome) ([place]) 07/14/2019  . Mental status alteration 07/13/2019  . Acute metabolic encephalopathy [id number]  . Protein-calorie malnutrition, severe 09/28/2018  . Nausea & vomiting 09/25/2018  . Choledocholithiasis 09/25/2018  . Chronic pain 09/25/2018  . Urinary retention 09/25/2018  . Constipation 09/25/2018  . Intractable vomiting   . 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 Thoracic aortic aneurysm ([place]) 10/03/2015  . Coronary artery calcification seen on CT scan 10/03/2015    Past Surgical History:  Procedure Laterality Date  . BACK SURGERY     09-2016  . back surgey    . ENDOSCOPIC RETROGRADE CHOLANGIOPANCREATOGRAPHY (ERCP) WITH PROPOFOL N/A 09/25/2018   Procedure: ENDOSCOPIC RETROGRADE CHOLANGIOPANCREATOGRAPHY (ERCP) WITH PROPOFOL;  Surgeon: Ronnette Juniper, MD;  Location: [facility];  Service: Gastroenterology;  Laterality: N/A;  . INSERTION OF SUPRAPUBIC CATHETER N/A 10/26/2019   Procedure: INSERTION OF SUPRAPUBIC CATHETER;  Surgeon: Irine Seal, MD;  Location: [facility];  Service: Urology;  Laterality: N/A;  . PANCREATIC STENT PLACEMENT  09/25/2018   Procedure: PANCREATIC STENT PLACEMENT;  Surgeon: Ronnette Juniper, MD;  Location: [facility] ENDOSCOPY;  Service: Gastroenterology;;  . REMOVAL OF STONES  09/25/2018   Procedure: REMOVAL OF  STONES;  Surgeon: Ronnette Juniper, MD;  Location: [place];  Service: Gastroenterology;;  . [name]  09/25/2018   Procedure: [name];  Surgeon: Ronnette Juniper, MD;  Location: [facility] ENDOSCOPY;  Service: Gastroenterology;;  . TRANSURETHRAL RESECTION OF PROSTATE N/A 10/26/2019   Procedure: TRANSURETHRAL RESECTION OF THE PROSTATE (TURP);  Surgeon: Irine Seal, MD;  Location: [facility];  Service: Urology;  Laterality: N/A;       Family History  Problem Relation Age of Onset  . Cancer Mother   . Heart attack Father     Social History   Tobacco Use  . Smoking status: Former Research scientist (life sciences)  . Smokeless tobacco: Former Systems developer    Quit date: 12/12/1979  . Tobacco comment: quit 45 years ago  Vaping Use  . Vaping Use: Never used  Substance Use Topics  . Alcohol use: No  . Drug use: No    Home Medications Prior to Admission medications   Medication Sig Start Date End Date Taking? Authorizing Provider  allopurinol (ZYLOPRIM) 100 MG tablet TAKE 1 TABLET BY MOUTH EVERY DAY Patient taking differently: Take 100 mg by mouth daily. 03/16/19  Yes Kathrynn Ducking, MD  aspirin 81 MG chewable tablet Chew 1 tablet (81 mg total) by mouth daily. 08/02/19  Yes Swayze, Ava, DO  atorvastatin (LIPITOR) 40 MG tablet Take 1 tablet (40 mg total) by mouth daily at 6 PM. Patient taking differently: Take 40 mg by mouth at bedtime. 08/02/19  Yes Swayze, Ava, DO  carvedilol (COREG) 6.25 MG tablet Take 1 tablet by mouth 2 (two) times daily. 04/25/20  Yes [provider]  cephALEXin (KEFLEX) 500 MG capsule Take 1 capsule (500 mg total) by mouth 4 (four) times daily for 7 days. 08/03/20 08/10/20 Yes Dykstra, Ellwood Dense, MD  diphenhydramine-acetaminophen (TYLENOL PM) 25-500 MG TABS tablet Take 1 tablet by mouth at bedtime.   Yes [provider]  ferrous sulfate 325 (65 FE) MG EC tablet Take 325 mg by mouth daily. 08/30/19  Yes [provider]  finasteride (PROSCAR) 5 MG tablet Take 1 tablet (5 mg total)  by mouth daily. For urination 07/27/19  Yes Alexis Frock, MD  furosemide (LASIX) 40 MG tablet Take 1 tablet (40 mg total) by mouth daily. 07/13/20 10/11/20 Yes Lorretta Harp, MD  HYDROcodone-acetaminophen (NORCO/VICODIN) 5-325 MG tablet Take 1 tablet by mouth 4 (four) times daily.   Yes [provider]  losartan (COZAAR) 25 MG tablet Take 25 mg by mouth daily. 04/25/20  Yes [provider]  OLANZapine (ZYPREXA) 5 MG tablet Take 1 tablet (5 mg total) by mouth daily. 08/02/19  Yes Swayze, Ava, DO  ondansetron (ZOFRAN ODT) 4 MG disintegrating tablet Take 1 tablet (4 mg total) by mouth every 8 (eight) hours as needed for nausea or vomiting. 08/03/20  Yes Lucrezia Starch, MD  pantoprazole (PROTONIX) 40 MG tablet Take 1 tablet (40 mg total) by mouth at bedtime. Patient taking differently: Take 40 mg by mouth daily. 08/02/19  Yes Swayze, Ava, DO  polyethylene glycol (MIRALAX / GLYCOLAX) 17 g packet Take 17 g by mouth daily. Patient  taking differently: Take 17 g by mouth daily as needed (constipation). 08/02/19  Yes Swayze, Ava, DO  pregabalin (LYRICA) 150 MG capsule Take 150 mg by mouth 2 (two) times daily. 06/01/20  Yes [provider]  traZODone (DESYREL) 50 MG tablet Take 50 mg by mouth at bedtime. 05/17/19  Yes [provider]  vitamin B-12 (CYANOCOBALAMIN) 500 MCG tablet Take 500 mcg by mouth daily.   Yes [provider]  cloNIDine (CATAPRES - DOSED IN MG/24 HR) 0.1 mg/24hr patch Place 1 patch (0.1 mg total) onto the skin once a week. Patient not taking: No sig reported 08/02/19   Swayze, Ava, DO    Allergies    Patient has no known allergies.  Review of Systems   Review of Systems  Gastrointestinal: Positive for abdominal pain.  All other systems reviewed and are negative.   Physical Exam Updated Vital Signs BP 127/85 (BP Location: Left Arm)   Pulse 74   Temp 97.7 F (36.5 C) (Oral)   Resp 18   Ht 5\' 7"  (1.702 m)   Wt 85 kg   SpO2  97%   BMI 29.35 kg/m   Physical Exam Vitals and nursing note reviewed.  Constitutional:      Appearance: He is well-developed and well-nourished.  HENT:     Head: Normocephalic and atraumatic.  Cardiovascular:     Rate and Rhythm: Normal rate and regular rhythm.     Heart sounds: No murmur heard.   Pulmonary:     Effort: Pulmonary effort is normal. No respiratory distress.     Breath sounds: Normal breath sounds.  Abdominal:     Palpations: Abdomen is soft.     Tenderness: There is no guarding or rebound.     Comments: Moderate generalized abdominal tenderness. Negative Murphy's.  Musculoskeletal:        General: No tenderness or edema.  Skin:    General: Skin is warm and dry.  Neurological:     Mental Status: He is alert and oriented to person, place, and time.  Psychiatric:        Mood and Affect: Mood and affect normal.        Behavior: Behavior normal.     ED Results / Procedures / Treatments   Labs (all labs ordered are listed, but only abnormal results are displayed) Labs Reviewed  COMPREHENSIVE METABOLIC PANEL - Abnormal; Notable for the following components:      Result Value   CO2 18 (*)    Glucose, Bld 108 (*)    BUN 26 (*)    Creatinine, Ser 1.39 (*)    Total Bilirubin 1.4 (*)    GFR, Estimated 51 (*)    All other components within normal limits  CBC - Abnormal; Notable for the following components:   RBC 3.82 (*)    Hemoglobin 12.4 (*)    HCT 35.3 (*)    Platelets 136 (*)    All other components within normal limits  URINALYSIS, ROUTINE W REFLEX MICROSCOPIC - Abnormal; Notable for the following components:   APPearance HAZY (*)    Ketones, ur 20 (*)    Leukocytes,Ua SMALL (*)    All other components within normal limits  URINE CULTURE  RESP PANEL BY RT-PCR (FLU A&B, COVID) ARPGX2  LIPASE, BLOOD    EKG None  Radiology US Abdomen Limited RUQ (LIVER/GB)  Result Date: 08/06/2020 CLINICAL DATA:  79 year old with right upper quadrant pain.  EXAM: ULTRASOUND ABDOMEN LIMITED RIGHT UPPER QUADRANT COMPARISON:  07/13/2019 and CT 08/03/2020 FINDINGS: Gallbladder: Mild to moderate distention of the gallbladder is similar to the recent CT. No gallbladder wall thickening. Reportedly, the patient does not have a sonographic Murphy sign. Non mobile and non shadowing echogenic structure near the base of the gallbladder measures up to 1.8 cm. There was a 0.9 cm echogenic structure in a similar location on the exam from 07/13/2019. Common bile duct: Diameter: 0.3 cm Liver: Limited evaluation of the liver due to bowel-gas. Liver is mildly heterogeneous. No discrete liver lesion. Portal vein is patent on color Doppler imaging with normal direction of blood flow towards the liver. Other: None. IMPRESSION: 1. Stable distention of the gallbladder without wall thickening or sonographic Murphy sign. No evidence for acute cholecystitis. 2. 1.8 cm non mobile and non shadowing echogenic structure in the gallbladder. Differential diagnosis includes gallstone versus sludge ball versus large polyp. Since a polyp cannot be excluded, recommend surgical consultation. 3. No biliary dilatation. Electronically Signed   By: Markus Daft M.D.   On: 08/06/2020 13:40    Procedures Procedures (including critical care time)  Medications Ordered in ED Medications  ondansetron (ZOFRAN) injection 4 mg (4 mg Intravenous Given 08/06/20 0619)  ondansetron (ZOFRAN) injection 4 mg (4 mg Intravenous Given 08/06/20 1244)  fentaNYL (SUBLIMAZE) injection 50 mcg (50 mcg Intravenous Given 08/06/20 1244)  sodium chloride 0.9 % bolus 500 mL (0 mLs Intravenous Stopped 08/06/20 1413)  cefTRIAXone (ROCEPHIN) 1 g in sodium chloride 0.9 % 100 mL IVPB (0 g Intravenous Stopped 08/06/20 1342)    ED Course  I have reviewed the triage vital signs and the nursing notes.  Pertinent labs & imaging results that were available during my care of the patient were reviewed by me and considered in my medical  decision making (see chart for details).    MDM Rules/Calculators/A&P                         patient presents the emergency department with complaints of abdominal pain. He was recently seen in the emergency department with CT abdomen pelvis and labs obtain it was started on antibiotics for urinary tract infection. He is non-toxic appearing on evaluation. He has tenderness on exam without peritoneal findings. UA today with small leukocytes. Given his recent UTI and inability to tolerate antibiotics today he was treated with a one-time dose of Rocephin. CBC without leukocytosis. CMP with mild worsening in his renal function. Right upper quadrant ultrasound is negative for cholecystitis but does demonstrate gallstone versus polyp. Discussed with on-call surgeon, does not feel current picture is consistent with cholecystitis, recommends HIDA scan. Given patient's ongoing pain, medicine consulted for admission for HIDA scan and further workup and treatment.  Final Clinical Impression(s) / ED Diagnoses Final diagnoses:  RUQ abdominal pain    Rx / DC Orders ED Discharge Orders    None       Quintella Reichert, MD 08/06/20 1542

## 2020-08-07 ENCOUNTER — Inpatient Hospital Stay (HOSPITAL_COMMUNITY): Payer: Medicare PPO | Admitting: Anesthesiology

## 2020-08-07 ENCOUNTER — Encounter (HOSPITAL_COMMUNITY)
Admission: EM | Disposition: A | Discharge status: Home / Self Care | Payer: Self-pay | Source: Home / Self Care | Attending: Internal Medicine

## 2020-08-07 DIAGNOSIS — R1011 Right upper quadrant pain: Secondary | ICD-10-CM

## 2020-08-07 DIAGNOSIS — R1013 Epigastric pain: Secondary | ICD-10-CM | POA: Diagnosis not present

## 2020-08-07 HISTORY — PX: CHOLECYSTECTOMY: SHX55

## 2020-08-07 LAB — COMPREHENSIVE METABOLIC PANEL
ALT: 14 U/L (ref 0–44)
AST: 23 U/L (ref 15–41)
Albumin: 3.5 g/dL (ref 3.5–5.0)
Alkaline Phosphatase: 78 U/L (ref 38–126)
Anion gap: 9 (ref 5–15)
BUN: 22 mg/dL (ref 8–23)
CO2: 26 mmol/L (ref 22–32)
Calcium: 8.8 mg/dL — ABNORMAL LOW (ref 8.9–10.3)
Chloride: 103 mmol/L (ref 98–111)
Creatinine, Ser: 1.2 mg/dL (ref 0.61–1.24)
GFR, Estimated: >60 mL/min (ref >60)
Glucose, Bld: 102 mg/dL — ABNORMAL HIGH (ref 70–99)
Potassium: 3.1 mmol/L — ABNORMAL LOW (ref 3.5–5.1)
Sodium: 138 mmol/L (ref 135–145)
Total Bilirubin: 1 mg/dL (ref 0.3–1.2)
Total Protein: 5.9 g/dL — ABNORMAL LOW (ref 6.5–8.1)

## 2020-08-07 LAB — URINE CULTURE: Culture: <10000 — AB

## 2020-08-07 LAB — MAGNESIUM: Magnesium: 2.2 mg/dL (ref 1.7–2.4)

## 2020-08-07 LAB — CBC
HCT: 30.5 % — ABNORMAL LOW (ref 39.0–52.0)
Hemoglobin: 10.8 g/dL — ABNORMAL LOW (ref 13.0–17.0)
MCH: 32 pg (ref 26.0–34.0)
MCHC: 35.4 g/dL (ref 30.0–36.0)
MCV: 90.5 fL (ref 80.0–100.0)
Platelets: 126 10*3/uL — ABNORMAL LOW (ref 150–400)
RBC: 3.37 MIL/uL — ABNORMAL LOW (ref 4.22–5.81)
RDW: 12.3 % (ref 11.5–15.5)
WBC: 4.7 10*3/uL (ref 4.0–10.5)
nRBC: 0 % (ref 0.0–0.2)

## 2020-08-07 SURGERY — LAPAROSCOPIC CHOLECYSTECTOMY WITH INTRAOPERATIVE CHOLANGIOGRAM
Anesthesia: General

## 2020-08-07 MED ORDER — PHENYLEPHRINE HCL-NACL 10-0.9 MG/250ML-% IV SOLN
INTRAVENOUS | Status: DC | PRN
  Administered 2020-08-07: 20 ug/min via INTRAVENOUS

## 2020-08-07 MED ORDER — CEFAZOLIN SODIUM-DEXTROSE 2-3 GM-%(50ML) IV SOLR
INTRAVENOUS | Status: DC | PRN
  Administered 2020-08-07: 2 g via INTRAVENOUS

## 2020-08-07 MED ORDER — FENTANYL CITRATE (PF) 100 MCG/2ML IJ SOLN
INTRAMUSCULAR | Status: AC
  Filled 2020-08-07: qty 2

## 2020-08-07 MED ORDER — CHLORHEXIDINE GLUCONATE 0.12 % MT SOLN: 15.0000 mL | Freq: Once | OROMUCOSAL | Status: AC

## 2020-08-07 MED ORDER — ONDANSETRON HCL 4 MG/2ML IJ SOLN: 4.0000 mg | Freq: Once | INTRAMUSCULAR | Status: DC | PRN

## 2020-08-07 MED ORDER — POTASSIUM CHLORIDE 10 MEQ/100ML IV SOLN
10.0000 meq | INTRAVENOUS | Status: AC
Start: 2020-08-07 — End: 2020-08-07
  Administered 2020-08-07 (×3): 10 meq via INTRAVENOUS
  Filled 2020-08-07 (×4): qty 100

## 2020-08-07 MED ORDER — LACTATED RINGERS IV SOLN: INTRAVENOUS | Status: DC

## 2020-08-07 MED ORDER — POTASSIUM CHLORIDE 10 MEQ/100ML IV SOLN
10.0000 meq | INTRAVENOUS | Status: AC
  Filled 2020-08-07: qty 100

## 2020-08-07 MED ORDER — SODIUM CHLORIDE 0.9 % IR SOLN
Status: DC | PRN
  Administered 2020-08-07: 1000 mL

## 2020-08-07 MED ORDER — DEXAMETHASONE SODIUM PHOSPHATE 10 MG/ML IJ SOLN
INTRAMUSCULAR | Status: DC | PRN
  Administered 2020-08-07: 10 mg via INTRAVENOUS

## 2020-08-07 MED ORDER — 0.9 % SODIUM CHLORIDE (POUR BTL) OPTIME
TOPICAL | Status: DC | PRN
  Administered 2020-08-07: 10:00:00 1000 mL

## 2020-08-07 MED ORDER — BUPIVACAINE HCL (PF) 0.25 % IJ SOLN
INTRAMUSCULAR | Status: DC | PRN
  Administered 2020-08-07: 10 mL
  Administered 2020-08-07: 15 mL

## 2020-08-07 MED ORDER — OXYCODONE HCL 5 MG/5ML PO SOLN
5.0000 mg | Freq: Once | ORAL | Status: DC | PRN
Start: 2020-08-07 — End: 2020-08-07

## 2020-08-07 MED ORDER — POTASSIUM CHLORIDE 10 MEQ/100ML IV SOLN
10.0000 meq | Freq: Once | INTRAVENOUS | Status: AC
  Administered 2020-08-07: 21:00:00 10 meq via INTRAVENOUS

## 2020-08-07 MED ORDER — FENTANYL CITRATE (PF) 250 MCG/5ML IJ SOLN
INTRAMUSCULAR | Status: DC | PRN
  Administered 2020-08-07: 100 ug via INTRAVENOUS
  Administered 2020-08-07: 50 ug via INTRAVENOUS

## 2020-08-07 MED ORDER — SUGAMMADEX SODIUM 200 MG/2ML IV SOLN
INTRAVENOUS | Status: DC | PRN
  Administered 2020-08-07: 200 mg via INTRAVENOUS

## 2020-08-07 MED ORDER — ONDANSETRON HCL 4 MG/2ML IJ SOLN
INTRAMUSCULAR | Status: DC | PRN
  Administered 2020-08-07: 4 mg via INTRAVENOUS

## 2020-08-07 MED ORDER — SUCCINYLCHOLINE CHLORIDE 200 MG/10ML IV SOSY
PREFILLED_SYRINGE | INTRAVENOUS | Status: DC | PRN
  Administered 2020-08-07: 100 mg via INTRAVENOUS

## 2020-08-07 MED ORDER — OXYCODONE HCL 5 MG PO TABS: 5.0000 mg | ORAL_TABLET | Freq: Once | ORAL | Status: DC | PRN

## 2020-08-07 MED ORDER — HEMOSTATIC AGENTS (NO CHARGE) OPTIME
TOPICAL | Status: DC | PRN
  Administered 2020-08-07: 1 via TOPICAL

## 2020-08-07 MED ORDER — PROPOFOL 10 MG/ML IV BOLUS
INTRAVENOUS | Status: DC | PRN
  Administered 2020-08-07: 100 mg via INTRAVENOUS

## 2020-08-07 MED ORDER — EPHEDRINE SULFATE-NACL 50-0.9 MG/10ML-% IV SOSY
PREFILLED_SYRINGE | INTRAVENOUS | Status: DC | PRN
  Administered 2020-08-07: 10 mg via INTRAVENOUS

## 2020-08-07 MED ORDER — LIDOCAINE 2% (20 MG/ML) 5 ML SYRINGE
INTRAMUSCULAR | Status: DC | PRN
  Administered 2020-08-07: 60 mg via INTRAVENOUS

## 2020-08-07 MED ORDER — PROPOFOL 10 MG/ML IV BOLUS
INTRAVENOUS | Status: AC
  Filled 2020-08-07: qty 20

## 2020-08-07 MED ORDER — FENTANYL CITRATE (PF) 250 MCG/5ML IJ SOLN
INTRAMUSCULAR | Status: AC
  Filled 2020-08-07: qty 5

## 2020-08-07 MED ORDER — ROCURONIUM BROMIDE 10 MG/ML (PF) SYRINGE
PREFILLED_SYRINGE | INTRAVENOUS | Status: DC | PRN
  Administered 2020-08-07: 50 mg via INTRAVENOUS

## 2020-08-07 MED ORDER — AMISULPRIDE (ANTIEMETIC) 5 MG/2ML IV SOLN: 10.0000 mg | Freq: Once | INTRAVENOUS | Status: DC | PRN

## 2020-08-07 MED ORDER — FENTANYL CITRATE (PF) 100 MCG/2ML IJ SOLN
25.0000 ug | INTRAMUSCULAR | Status: DC | PRN
  Administered 2020-08-07: 25 ug via INTRAVENOUS

## 2020-08-07 MED ORDER — CHLORHEXIDINE GLUCONATE 0.12 % MT SOLN
OROMUCOSAL | Status: AC
  Administered 2020-08-07: 09:00:00 15 mL via OROMUCOSAL
  Filled 2020-08-07: qty 15

## 2020-08-07 MED ORDER — BUPIVACAINE HCL (PF) 0.25 % IJ SOLN
INTRAMUSCULAR | Status: AC
  Filled 2020-08-07: qty 30

## 2020-08-07 SURGICAL SUPPLY — 41 items
APPLIER CLIP 5 13 M/L LIGAMAX5 (MISCELLANEOUS) ×2
CANISTER SUCT 3000ML PPV (MISCELLANEOUS) ×2 IMPLANT
CHLORAPREP W/TINT 26 (MISCELLANEOUS) ×2 IMPLANT
CLIP APPLIE 5 13 M/L LIGAMAX5 (MISCELLANEOUS) ×1 IMPLANT
CNTNR URN SCR LID CUP LEK RST (MISCELLANEOUS) ×1 IMPLANT
CONT SPEC 4OZ STRL OR WHT (MISCELLANEOUS) ×2
COVER MAYO STAND STRL (DRAPES) IMPLANT
COVER SURGICAL LIGHT HANDLE (MISCELLANEOUS) ×2 IMPLANT
COVER WAND RF STERILE (DRAPES) ×2 IMPLANT
DERMABOND ADVANCED (GAUZE/BANDAGES/DRESSINGS) ×1
DERMABOND ADVANCED .7 DNX12 (GAUZE/BANDAGES/DRESSINGS) ×1 IMPLANT
DRAPE C-ARM 42X120 X-RAY (DRAPES) IMPLANT
ELECT REM PT RETURN 9FT ADLT (ELECTROSURGICAL) ×2
ELECTRODE REM PT RTRN 9FT ADLT (ELECTROSURGICAL) ×1 IMPLANT
GLOVE BIO SURGEON STRL SZ 6 (GLOVE) ×2 IMPLANT
GLOVE INDICATOR 6.5 STRL GRN (GLOVE) ×2 IMPLANT
GOWN STRL REUS W/ TWL LRG LVL3 (GOWN DISPOSABLE) ×3 IMPLANT
GOWN STRL REUS W/TWL LRG LVL3 (GOWN DISPOSABLE) ×6
GRASPER SUT TROCAR 14GX15 (MISCELLANEOUS) ×2 IMPLANT
HEMOSTAT SNOW SURGICEL 2X4 (HEMOSTASIS) ×2 IMPLANT
KIT BASIN OR (CUSTOM PROCEDURE TRAY) ×2 IMPLANT
KIT TURNOVER KIT B (KITS) ×2 IMPLANT
NEEDLE INSUFFLATION 14GA 120MM (NEEDLE) IMPLANT
NS IRRIG 1000ML POUR BTL (IV SOLUTION) ×2 IMPLANT
PAD ARMBOARD 7.5X6 YLW CONV (MISCELLANEOUS) ×2 IMPLANT
POUCH SPECIMEN RETRIEVAL 10MM (ENDOMECHANICALS) ×2 IMPLANT
SCISSORS LAP 5X35 DISP (ENDOMECHANICALS) ×2 IMPLANT
SET CHOLANGIOGRAPH 5 50 .035 (SET/KITS/TRAYS/PACK) IMPLANT
SET IRRIG TUBING LAPAROSCOPIC (IRRIGATION / IRRIGATOR) ×2 IMPLANT
SET TUBE SMOKE EVAC HIGH FLOW (TUBING) ×2 IMPLANT
SLEEVE ENDOPATH XCEL 5M (ENDOMECHANICALS) ×4 IMPLANT
SUT MNCRL AB 4-0 PS2 18 (SUTURE) ×2 IMPLANT
SUT MON AB 4-0 PC3 18 (SUTURE) ×2 IMPLANT
SYR CONTROL 10ML LL (SYRINGE) ×2 IMPLANT
TOWEL GREEN STERILE (TOWEL DISPOSABLE) ×2 IMPLANT
TOWEL GREEN STERILE FF (TOWEL DISPOSABLE) ×2 IMPLANT
TRAY LAPAROSCOPIC MC (CUSTOM PROCEDURE TRAY) ×2 IMPLANT
TROCAR XCEL NON-BLD 11X100MML (ENDOMECHANICALS) ×2 IMPLANT
TROCAR XCEL NON-BLD 5MMX100MML (ENDOMECHANICALS) ×2 IMPLANT
WARMER LAPAROSCOPE (MISCELLANEOUS) ×2 IMPLANT
WATER STERILE IRR 1000ML POUR (IV SOLUTION) IMPLANT

## 2020-08-07 NOTE — Plan of Care (Signed)
  Problem: Coping: Goal: Level of anxiety will decrease Outcome: Progressing   Problem: Pain Managment: Goal: General experience of comfort will improve Outcome: Progressing   Problem: Safety: Goal: Ability to remain free from injury will improve Outcome: Progressing   Problem: Skin Integrity: Goal: Risk for impaired skin integrity will decrease Outcome: Progressing   

## 2020-08-07 NOTE — Transfer of Care (Signed)
Immediate Anesthesia Transfer of Care Note  Patient: Riley Eaton  Procedure(s) Performed: LAPAROSCOPIC CHOLECYSTECTOMY (N/A )  Patient Location: PACU  Anesthesia Type:General  Level of Consciousness: drowsy and patient cooperative  Airway & Oxygen Therapy: Patient Spontanous Breathing and Patient connected to face mask oxygen  Post-op Assessment: Report given to RN and Post -op Vital signs reviewed and stable  Post vital signs: Reviewed and stable  Last Vitals:  Vitals Value Taken Time  BP 168/99 08/07/20 1036  Temp    Pulse 67 08/07/20 1040  Resp 16 08/07/20 1040  SpO2 99 % 08/07/20 1040  Vitals shown include unvalidated device data.  Last Pain:  Vitals:   08/07/20 0820  TempSrc: Oral  PainSc: 0-No pain         Complications: No complications documented.

## 2020-08-07 NOTE — Discharge Instructions (Signed)
[facility] ° °Please arrive at least 30 min before your appointment to complete your check in paperwork.  If you are unable to arrive 30 min prior to your appointment time we may have to cancel or reschedule you. °LAPAROSCOPIC SURGERY: POST OP INSTRUCTIONS °Always review your discharge instruction sheet given to you by the facility where your surgery was performed. °IF YOU HAVE DISABILITY OR FAMILY LEAVE FORMS, YOU MUST BRING THEM TO THE OFFICE FOR PROCESSING.   °DO NOT GIVE THEM TO YOUR DOCTOR. ° °PAIN CONTROL ° °1. First take acetaminophen (Tylenol) AND/or ibuprofen (Advil) to control your pain after surgery.  Follow directions on package.  Taking acetaminophen (Tylenol) and/or ibuprofen (Advil) regularly after surgery will help to control your pain and lower the amount of prescription pain medication you may need.  You should not take more than 4,000 mg (4 grams) of acetaminophen (Tylenol) in 24 hours.  You should not take ibuprofen (Advil), aleve, motrin, naprosyn or other NSAIDS if you have a history of stomach ulcers or chronic kidney disease.  °2. A prescription for pain medication may be given to you upon discharge.  Take your pain medication as prescribed, if you still have uncontrolled pain after taking acetaminophen (Tylenol) or ibuprofen (Advil). °3. Use ice packs to help control pain. °4. If you need a refill on your pain medication, please contact your pharmacy.  They will contact our office to request authorization. Prescriptions will not be filled after 5pm or on week-ends. ° °HOME MEDICATIONS °5. Take your usually prescribed medications unless otherwise directed. ° °DIET °6. You should follow a light diet the first few days after arrival home.  Be sure to include lots of fluids daily. Avoid fatty, fried foods.  ° °CONSTIPATION °7. It is common to experience some constipation after surgery and if you are taking pain medication.  Increasing fluid intake and taking a stool  softener (such as Colace) will usually help or prevent this problem from occurring.  A mild laxative (Milk of Magnesia or Miralax) should be taken according to package instructions if there are no bowel movements after 48 hours. ° °WOUND/INCISION CARE °8. Most patients will experience some swelling and bruising in the area of the incisions.  Ice packs will help.  Swelling and bruising can take several days to resolve.  °9. Unless discharge instructions indicate otherwise, follow guidelines below  °a. STERI-STRIPS - you may remove your outer bandages 48 hours after surgery, and you may shower at that time.  You have steri-strips (small skin tapes) in place directly over the incision.  These strips should be left on the skin for 7-10 days.   °b. DERMABOND/SKIN GLUE - you may shower in 24 hours.  The glue will flake off over the next 2-3 weeks. °10. Any sutures or staples will be removed at the office during your follow-up visit. ° °ACTIVITIES °11. You may resume regular (light) daily activities beginning the next day--such as daily self-care, walking, climbing stairs--gradually increasing activities as tolerated.  You may have sexual intercourse when it is comfortable.  Refrain from any heavy lifting or straining until approved by your doctor. °a. You may drive when you are no longer taking prescription pain medication, you can comfortably wear a seatbelt, and you can safely maneuver your car and apply brakes. ° °FOLLOW-UP °12. You should see your doctor in the office for a follow-up appointment approximately 2-3 weeks after your surgery.  You should have been given your post-op/follow-up appointment when   your surgery was scheduled.  If you did not receive a post-op/follow-up appointment, make sure that you call for this appointment within a day or two after you arrive home to insure a convenient appointment time.   WHEN TO CALL YOUR DOCTOR: 1. Fever over 101.0 2. Inability to urinate 3. Continued bleeding from  incision. 4. Increased pain, redness, or drainage from the incision. 5. Increasing abdominal pain  The clinic staff is available to answer your questions during regular business hours.  Please don't hesitate to call and ask to speak to one of the nurses for clinical concerns.  If you have a medical emergency, go to the nearest emergency room or call 911.  A surgeon from [facility] is always on call at the hospital. [address], [address], [place], [place]  [address] ? [address] [address], [place], [place]   [address] [phone] ? [phone] ? FAX [phone]    Gallbladder Eating Plan If you have a gallbladder condition, you may have trouble digesting fats. Eating a low-fat diet can help reduce your symptoms, and may be helpful before and after having surgery to remove your gallbladder (cholecystectomy). Your health care provider may recommend that you work with a diet and nutrition specialist (dietitian) to help you reduce the amount of fat in your diet. What are tips for following this plan? General guidelines  Limit your fat intake to less than 30% of your total daily calories. If you eat around 1,800 calories each day, this is less than 60 grams (g) of fat per day.  Fat is an important part of a healthy diet. Eating a low-fat diet can make it hard to maintain a healthy body weight. Ask your dietitian how much fat, calories, and other nutrients you need each day.  Eat small, frequent meals throughout the day instead of three large meals.  Drink at least 8-10 cups of fluid a day. Drink enough fluid to keep your urine clear or pale yellow.  Limit alcohol intake to no more than 1 drink a day for nonpregnant women and 2 drinks a day for men. One drink equals 12 oz of beer, 5 oz of wine, or 1 oz of hard liquor. Reading food labels  Check Nutrition Facts on food labels for the amount of fat per serving. Choose foods with less than 3 grams of fat per  serving. Shopping  Choose nonfat and low-fat healthy foods. Look for the words "nonfat," "low fat," or "fat free."  Avoid buying processed or prepackaged foods. Cooking  Cook using low-fat methods, such as baking, broiling, grilling, or boiling.  Cook with small amounts of healthy fats, such as olive oil, grapeseed oil, canola oil, or sunflower oil. What foods are recommended?   All fresh, frozen, or canned fruits and vegetables.  Whole grains.  Low-fat or non-fat (skim) milk and yogurt.  Lean meat, skinless poultry, fish, eggs, and beans.  Low-fat protein supplement powders or drinks.  Spices and herbs. What foods are not recommended?  High-fat foods. These include baked goods, fast food, fatty cuts of meat, ice cream, french toast, sweet rolls, pizza, cheese bread, foods covered with butter, creamy sauces, or cheese.  Fried foods. These include french fries, tempura, battered fish, breaded chicken, fried breads, and sweets.  Foods with strong odors.  Foods that cause bloating and gas. Summary  A low-fat diet can be helpful if you have a gallbladder condition, or before and after gallbladder surgery.  Limit your fat intake to   less than 30% of your total daily calories. This is about 60 g of fat if you eat 1,800 calories each day.  Eat small, frequent meals throughout the day instead of three large meals. This information is not intended to replace advice given to you by your health care provider. Make sure you discuss any questions you have with your health care provider. Document Revised: 11/19/2018 Document Reviewed: 09/05/2016 Elsevier Patient Education  2020 [employer]  

## 2020-08-07 NOTE — Op Note (Signed)
Operative Note  Riley Eaton 79 y.o. male [id number]  08/07/2020  Surgeon: Clovis Riley MD FACS  Procedure performed: Laparoscopic Cholecystectomy  Procedure classification: urgent  Preop diagnosis: acute cholecystitis Post-op diagnosis/intraop findings: same  Specimens: gallbladder  Retained items: none  EBL: 62GB  Complications: none  Description of procedure: After obtaining informed consent the patient was brought to the operating room. Antibiotics were administered. SCD's were applied. General endotracheal anesthesia was initiated and a formal time-out was performed. The abdomen was prepped and draped in the usual sterile fashion and the abdomen was entered using an infraumbilical veress needle after instilling the site with local. Insufflation to 2mmHg was obtained, 110mm trocar and camera inserted, and gross inspection revealed no evidence of injury from our entry or other intraabdominal abnormalities.  The bladder is quite distended but well below the umbilical trocar.  Two 48mm trocars were introduced in the right midclavicular and right anterior axillary lines under direct visualization and following infiltration with local. An 83mm trocar was placed in the epigastrium. The gallbladder was retracted cephalad and the infundibulum was retracted laterally.  There was a small amount of bleeding from avulsion of the distal gallbladder from the liver bed from retraction which was controlled with cautery at the end of the case.  A combination of hook electrocautery and blunt dissection was utilized to clear the peritoneum from the neck and cystic duct, circumferentially isolating the cystic artery and cystic duct and lifting the gallbladder from the cystic plate. The critical view of safety was achieved with the cystic artery, cystic duct, and liver bed visualized between them with no other structures. The artery was clipped with a double clip proximally and one distally and divided  as was the cystic duct with three clips on the proximal end. The gallbladder was dissected from the liver plate using electrocautery. Once freed the gallbladder was placed in an endocatch bag and removed through the epigastric trocar site. A small amount of bleeding on the liver bed was controlled with cautery.  Minimal bile had been spilled from the gallbladder during its dissection from the liver bed. This was aspirated and the right upper quadrant was irrigated copiously until the effluent was clear. Hemostasis was once again confirmed, and reinspection of the abdomen revealed no injuries. The clips were well opposed without any bile leak from the duct or the liver bed.  Surgicel snow was placed in the liver bed.  The 43mm trocar site in the epigastrium was closed with a 0 vicryl in the fascia under direct visualization using a PMI device. The abdomen was desufflated and all trocars removed. The skin incisions were closed with subcuticular monocryl and Dermabond. The patient was awakened, extubated and transported to the recovery room in stable condition.    All counts were correct at the completion of the case.

## 2020-08-07 NOTE — Progress Notes (Signed)
Subjective/Chief Complaint: Feeling ok this morning, a little nauseated, not much pain   Objective: Vital signs in last 24 hours: Temp:  [97.4 F (36.3 C)-97.9 F (36.6 C)] 97.6 F (36.4 C) (12/27 0343) Pulse Rate:  [55-81] 55 (12/27 0343) Resp:  [16-19] 17 (12/27 0343) BP: (112-156)/(67-94) 141/79 (12/27 0343) SpO2:  [94 %-99 %] 94 % (12/27 0343) Last BM Date: 08/04/20  Intake/Output from previous day: 12/26 0701 - 12/27 0700 In: 1220 [I.V.:620; IV Piggyback:600] Out: -  Intake/Output this shift: No intake/output data recorded.  General appearance: alert and cooperative Resp: clear to auscultation bilaterally Cardio: regular rate and rhythm GI: soft, nondistended, tender in epigastrium and RUQ without peritoneal signs Skin: Skin color, texture, turgor normal. No rashes or lesions  Lab Results:  Recent Labs    08/06/20 0422 08/07/20 0232  WBC 5.3 4.7  HGB 12.4* 10.8*  HCT 35.3* 30.5*  PLT 136* 126*   BMET Recent Labs    08/06/20 0422 08/07/20 0232  NA 138 138  K 3.8 3.1*  CL 105 103  CO2 18* 26  GLUCOSE 108* 102*  BUN 26* 22  CREATININE 1.39* 1.20  CALCIUM 9.2 8.8*   PT/INR No results for input(s): LABPROT, INR in the last 72 hours. ABG No results for input(s): PHART, HCO3 in the last 72 hours.  Invalid input(s): PCO2, PO2  Studies/Results: US Abdomen Limited RUQ (LIVER/GB)  Result Date: 08/06/2020 CLINICAL DATA:  79 year old with right upper quadrant pain. EXAM: ULTRASOUND ABDOMEN LIMITED RIGHT UPPER QUADRANT COMPARISON:  07/13/2019 and CT 08/03/2020 FINDINGS: Gallbladder: Mild to moderate distention of the gallbladder is similar to the recent CT. No gallbladder wall thickening. Reportedly, the patient does not have a sonographic Murphy sign. Non mobile and non shadowing echogenic structure near the base of the gallbladder measures up to 1.8 cm. There was a 0.9 cm echogenic structure in a similar location on the exam from 07/13/2019. Common bile  duct: Diameter: 0.3 cm Liver: Limited evaluation of the liver due to bowel-gas. Liver is mildly heterogeneous. No discrete liver lesion. Portal vein is patent on color Doppler imaging with normal direction of blood flow towards the liver. Other: None. IMPRESSION: 1. Stable distention of the gallbladder without wall thickening or sonographic Murphy sign. No evidence for acute cholecystitis. 2. 1.8 cm non mobile and non shadowing echogenic structure in the gallbladder. Differential diagnosis includes gallstone versus sludge ball versus large polyp. Since a polyp cannot be excluded, recommend surgical consultation. 3. No biliary dilatation. Electronically Signed   By: Markus Daft M.D.   On: 08/06/2020 13:40    Anti-infectives: Anti-infectives (From admission, onward)   Start     Dose/Rate Route Frequency Ordered Stop   08/06/20 1230  cefTRIAXone (ROCEPHIN) 1 g in sodium chloride 0.9 % 100 mL IVPB        1 g 200 mL/hr over 30 Minutes Intravenous  Once 08/06/20 1224 08/06/20 1342      Assessment/Plan: 79yo man with multiple medical problems including cardiac hx/CHF, admitted with clinical cholecystitis and known hx choledocholithiasis s/p ercp/sphincterotomy last year. He has been evaluated by cardiology and is deemed intermediate risk, but after thorough counseling of risks/benefits of invasive cardiac workup the conclusion is to proceed with lap chole without further cardiac testing. Discussed technique and risks of surgery including bleeding, pain, scarring, intraabdominal injury specifically to the common bile duct and sequelae, bile leak, conversion to open surgery, blood clot, pneumonia, heart attack or other cardiac complication, stroke, failure to resolve symptoms,  exacerbation of other medical issues, etc. Questions welcomed and answered. He wishes to proceed.   LOS: 1 day    Berna Bue 08/07/2020

## 2020-08-07 NOTE — Progress Notes (Addendum)
PROGRESS NOTE    Riley Eaton  ZOX:[id number] DOB: 06-Jan-1941 DOA: 08/06/2020 PCP: Riley Eaton., PA-C    Brief Narrative: HPI per Dr. Ophelia Eaton on 08/06/2020   Riley Eaton is a 79 y.o. male with medical history significant of MGUS; CAD; thoracic aortic aneurysm; CHF; BPH s/p TURP; and chronic pain presenting with abdominal pain.  He has been feeling nauseated for about 4-5 days.  He went to [facility] Thursday and they gave him antibiotics and a nausea pill.  He didn't seem to notice much improvement.  The last 2 nights he was very nauseated without emesis.  He was having upper abdominal pain.  Nothing made it better or worse.  No fever.  He has had anorexia.  He is tolerating water.  2 years ago, he had cholecystitis; they removed gallstones but they couldn't get his BP under good control and decided to do it as an outpatient.  COVID happened after that; his BP improved but surgeries weren't happening.He took 4 antibiotics pills Friday and Saturday.  He also had Rocephin at [facility] and today.  He was not noticing urinary symptoms.  He had a foley followed by a suprapubic catheter but has not had that in about 3 months.     ED Course:  H/o gallstones, abdominal pain and nausea x 1 week.  Seen at [facility] - UTI, given Keflex.  Worsening nausea, can't tolerate Keflex, worsening pain.  Generalized discomfort, nothing focal.  RUQ [place] with stone vs. Polyp - needs surgical consultation.  Surgery to see, recommends HIDA, no cholecystitis.  Given Rocephin.  Assessment & Plan:   Active Problems:   Coronary artery calcification seen on CT scan   Chronic low back pain   Abdominal pain   Hyperlipidemia   MGUS (monoclonal gammopathy of unknown significance)   Chronic systolic CHF (congestive heart failure) (HCC)   Stage 3a chronic kidney disease (HCC)   Cholelithiasis without obstruction   #1 cholecystitis he is now status post laparoscopic cholecystectomy.  He was supposed to have this procedure  done 2 years ago.  I have seen him after the surgery he does not appear to be in any distress he is feeling better other than incisional pain.  He is passing gas and urinating. Ultrasound of the gallbladder showed 1.8 cm mobile nonshadowing echogenic structure in the gallbladder stone versus polyp versus sludge.  #2 BPH and TURP patient has history of urinary obstruction and has had suprapubic catheter in the past.  Currently he is able to urinate well.  He just would like to be out of bed and sit in the bedside commode to urinate. Continue Proscar.  #3 MGUS followed by Dr. Myna Eaton  #4 history of CAD and combined congestive heart failure ejection fraction 40 to 45 % 8/21 Cardiology following. Lasix was on hold due to ketonuria and dehydration at the time of admission.  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  #9hypokalemia-k 3.1 replete recheck in am Mag 2.2    Estimated body mass index is 29.35 kg/m as calculated from the following:   Height as of this encounter: 5\' 7"  (1.702 m).   Weight as of this encounter: 85 kg.  DVT prophylaxis: SCD  code Status: DNR  family Communication none at  bedside  disposition Plan:  Status is: Inpatient  Dispo: The patient is from: Home              Anticipated d/c is to: Home              Anticipated d/c date is: 2 days 1 to 2 days              Patient currently is not medically stable to d/c.   Consultants:   Cardiology and general surgery  Procedures: Laparoscopic cholecystectomy 08/07/2020 Antimicrobials: None  Subjective: Patient is resting in bed eating clear liquids tolerating well no nausea vomiting reported , Having flatus Urinated no Foley in place Objective: Vitals:   08/07/20 1105 08/07/20 1120 08/07/20  1135 08/07/20 1200  BP: (!) 174/88 (!) 173/95 (!) 175/83 (!) 166/91  Pulse: 62 (!) 57 (!) 55 62  Resp: 12 (!) 9 (!) 8 14  Temp:    (!) 97.3 F (36.3 C)  TempSrc:    Oral  SpO2: 98% 97% 96% 95%  Weight:      Height:        Intake/Output Summary (Last 24 hours) at 08/07/2020 1359 Last data filed at 08/07/2020 1033 Gross per 24 hour  Intake 1820 ml  Output 20 ml  Net 1800 ml   Filed Weights   08/06/20 0416  Weight: 85 kg    Examination:  General exam: Appears calm and comfortable  Respiratory system: Clear to auscultation. Respiratory effort normal. Cardiovascular system: S1 & S2 heard, RRR. No JVD, murmurs, rubs, gallops or clicks. No pedal edema. Gastrointestinal system: Abdomen is nondistended, soft and nontender. No organomegaly or masses felt. Normal bowel sounds heard.  Laparoscopy incision sites clean and tender. Central nervous system: Alert and oriented. No focal neurological deficits. Extremities: Symmetric 5 x 5 power. Skin: No rashes, lesions or ulcers Psychiatry: Judgement and insight appear normal. Mood & affect appropriate.     Data Reviewed: I have personally reviewed following labs and imaging studies  CBC: Recent Labs  Lab 08/03/20 1536 08/06/20 0422 08/07/20 0232  WBC 7.0 5.3 4.7  HGB 13.0 12.4* 10.8*  HCT 36.1* 35.3* 30.5*  MCV 90.7 92.4 90.5  PLT 153 136* 123XX123*   Basic Metabolic Panel: Recent Labs  Lab 08/03/20 1536 08/06/20 0422 08/07/20 0232  NA 138 138 138  K 3.8 3.8 3.1*  CL 103 105 103  CO2 26 18* 26  GLUCOSE 138* 108* 102*  BUN 28* 26* 22  CREATININE 1.14 1.39* 1.20  CALCIUM 9.3 9.2 8.8*  MG  --   --  2.2   GFR: Estimated Creatinine Clearance: 51.2 mL/min (by C-G formula based on SCr of 1.2 mg/dL). Liver Function Tests: Recent Labs  Lab 08/03/20 1536 08/06/20 0422 08/07/20 0232  AST 26 33 23  ALT 15 17 14   ALKPHOS 95 90 78  BILITOT 1.1 1.4* 1.0  PROT 7.6 6.7 5.9*  ALBUMIN 4.5 4.2 3.5   Recent Labs  Lab  08/03/20 1536 08/06/20 0422  LIPASE 18 19   No results for input(s): AMMONIA in the last 168 hours. Coagulation Profile: No results for input(s): INR, PROTIME in the last 168 hours. Cardiac Enzymes: No results for input(s): CKTOTAL, CKMB, CKMBINDEX, TROPONINI in the last 168 hours. BNP (last 3 results) No results for input(s): PROBNP in the last 8760 hours. HbA1C: No results for input(s): HGBA1C in the last 72 hours. CBG: No results for input(s): GLUCAP in the last 168 hours. Lipid Profile: No results for  input(s): CHOL, HDL, LDLCALC, TRIG, CHOLHDL, LDLDIRECT in the last 72 hours. Thyroid Function Tests: No results for input(s): TSH, T4TOTAL, FREET4, T3FREE, THYROIDAB in the last 72 hours. Anemia Panel: No results for input(s): VITAMINB12, FOLATE, FERRITIN, TIBC, IRON, RETICCTPCT in the last 72 hours. Sepsis Labs: No results for input(s): PROCALCITON, LATICACIDVEN in the last 168 hours.  Recent Results (from the past 240 hour(s))  Resp Panel by RT-PCR (Flu A&B, Covid) Nasopharyngeal Swab     Status: None   Collection Time: 08/06/20  2:52 PM   Specimen: Nasopharyngeal Swab; Nasopharyngeal(NP) swabs in vial transport medium  Result Value Ref Range Status   SARS Coronavirus 2 by RT PCR NEGATIVE NEGATIVE Final    Comment: (NOTE) SARS-CoV-2 target nucleic acids are NOT DETECTED.  The SARS-CoV-2 RNA is generally detectable in upper respiratory specimens during the acute phase of infection. The lowest concentration of SARS-CoV-2 viral copies this assay can detect is 138 copies/mL. A negative result does not preclude SARS-Cov-2 infection and should not be used as the sole basis for treatment or other patient management decisions. A negative result may occur with  improper specimen collection/handling, submission of specimen other than nasopharyngeal swab, presence of viral mutation(s) within the areas targeted by this assay, and inadequate number of viral copies(<138 copies/mL). A  negative result must be combined with clinical observations, patient history, and epidemiological information. The expected result is Negative.  Fact Sheet for Patients:  EntrepreneurPulse.com.au  Fact Sheet for Healthcare Providers:  IncredibleEmployment.be  This test is no t yet approved or cleared by the [place] FDA and  has been authorized for detection and/or diagnosis of SARS-CoV-2 by FDA under an Emergency Use Authorization (EUA). This EUA will remain  in effect (meaning this test can be used) for the duration of the COVID-19 declaration under Section 564(b)(1) of the Act, 21 U.S.C.section 360bbb-3(b)(1), unless the authorization is terminated  or revoked sooner.       Influenza A by PCR NEGATIVE NEGATIVE Final   Influenza B by PCR NEGATIVE NEGATIVE Final    Comment: (NOTE) The Xpert Xpress SARS-CoV-2/FLU/RSV plus assay is intended as an aid in the diagnosis of influenza from Nasopharyngeal swab specimens and should not be used as a sole basis for treatment. Nasal washings and aspirates are unacceptable for Xpert Xpress SARS-CoV-2/FLU/RSV testing.  Fact Sheet for Patients: EntrepreneurPulse.com.au  Fact Sheet for Healthcare Providers: IncredibleEmployment.be  This test is not yet approved or cleared by the [place] FDA and has been authorized for detection and/or diagnosis of SARS-CoV-2 by FDA under an Emergency Use Authorization (EUA). This EUA will remain in effect (meaning this test can be used) for the duration of the COVID-19 declaration under Section 564(b)(1) of the Act, 21 U.S.C. section 360bbb-3(b)(1), unless the authorization is terminated or revoked.  Performed at [facility], [place] [address] [place], [place] [address]   Surgical pcr screen     Status: None   Collection Time: 08/06/20 10:02 PM   Specimen: Nasal Mucosa; Nasal Swab  Result Value Ref Range Status    MRSA, PCR NEGATIVE NEGATIVE Final   Staphylococcus aureus NEGATIVE NEGATIVE Final    Comment: (NOTE) The Xpert SA Assay (FDA approved for NASAL specimens in patients 28 years of age and older), is one component of a comprehensive surveillance program. It is not intended to diagnose infection nor to guide or monitor treatment. Performed at [facility], [place] [address], [place], [place] [address]  Radiology Studies: US Abdomen Limited RUQ (LIVER/GB)  Result Date: 08/06/2020 CLINICAL DATA:  79 year old with right upper quadrant pain. EXAM: ULTRASOUND ABDOMEN LIMITED RIGHT UPPER QUADRANT COMPARISON:  07/13/2019 and CT 08/03/2020 FINDINGS: Gallbladder: Mild to moderate distention of the gallbladder is similar to the recent CT. No gallbladder wall thickening. Reportedly, the patient does not have a sonographic Murphy sign. Non mobile and non shadowing echogenic structure near the base of the gallbladder measures up to 1.8 cm. There was a 0.9 cm echogenic structure in a similar location on the exam from 07/13/2019. Common bile duct: Diameter: 0.3 cm Liver: Limited evaluation of the liver due to bowel-gas. Liver is mildly heterogeneous. No discrete liver lesion. Portal vein is patent on color Doppler imaging with normal direction of blood flow towards the liver. Other: None. IMPRESSION: 1. Stable distention of the gallbladder without wall thickening or sonographic Murphy sign. No evidence for acute cholecystitis. 2. 1.8 cm non mobile and non shadowing echogenic structure in the gallbladder. Differential diagnosis includes gallstone versus sludge ball versus large polyp. Since a polyp cannot be excluded, recommend surgical consultation. 3. No biliary dilatation. Electronically Signed   By: Markus Daft M.D.   On: 08/06/2020 13:40        Scheduled Meds: . allopurinol  100 mg Oral Daily  . atorvastatin  40 mg Oral QHS  . carvedilol  6.25 mg Oral BID  . fentaNYL      . finasteride  5  mg Oral Daily  . HYDROcodone-acetaminophen  1 tablet Oral QID  . OLANZapine  5 mg Oral Daily  . pantoprazole  40 mg Oral QHS  . pregabalin  150 mg Oral BID  . traZODone  50 mg Oral QHS   Continuous Infusions: . lactated ringers 75 mL/hr at 08/07/20 1206  . potassium chloride 10 mEq (08/07/20 1337)     LOS: 1 day     Georgette Shell, MD 08/07/2020, 1:59 PM

## 2020-08-07 NOTE — Progress Notes (Signed)
Pt tolerated clear liquid diet well, advanced to heart healthy diet per order.

## 2020-08-07 NOTE — Plan of Care (Signed)

## 2020-08-07 NOTE — Anesthesia Procedure Notes (Signed)
Procedure Name: Intubation Date/Time: 08/07/2020 9:30 AM Performed by: Thelma Comp, CRNA Pre-anesthesia Checklist: Patient identified, Emergency Drugs available, Suction available and Patient being monitored Patient Re-evaluated:Patient Re-evaluated prior to induction Oxygen Delivery Method: Circle System Utilized Preoxygenation: Pre-oxygenation with 100% oxygen Induction Type: IV induction Ventilation: Mask ventilation without difficulty Laryngoscope Size: Mac and 4 Grade View: Grade I Tube type: Oral Tube size: 7.5 mm Number of attempts: 1 Airway Equipment and Method: Stylet and Oral airway Placement Confirmation: ETT inserted through vocal cords under direct vision,  positive ETCO2 and breath sounds checked- equal and bilateral Secured at: 22 cm Tube secured with: Tape Dental Injury: Teeth and Oropharynx as per pre-operative assessment

## 2020-08-07 NOTE — Anesthesia Preprocedure Evaluation (Addendum)
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  Normal cardiovascular exam     Neuro/Psych negative neurological ROS  negative psych ROS   GI/Hepatic negative GI ROS, Neg liver ROS,   Endo/Other  negative endocrine ROS  Renal/GU Renal InsufficiencyRenal disease Bladder dysfunction (BPH)      Musculoskeletal negative musculoskeletal ROS (+)   Abdominal   Peds  Hematology MGUS   Anesthesia Other Findings  Echo 03/20/20: EF 40-45%, mild LVH, g1dd, normal RV function, mild AR, mild dilatation of the ascending aorta measuring 41 mm  Stress test 2017:  The left ventricular ejection fraction is normal (55-65%).  Nuclear stress EF: 57%.  There was no ST segment deviation noted during stress.  The study is normal.  This is a low risk study.   Normal pharmacologic nuclear stress test with no prior infarct and no ischemia.   Reproductive/Obstetrics                            Anesthesia Physical Anesthesia Plan  ASA: III  Anesthesia Plan: General   Post-op Pain Management:    Induction: Intravenous  PONV Risk Score and Plan: 2 and Ondansetron, Dexamethasone, Treatment may vary due to age or medical condition and Midazolam  Airway Management Planned: Oral ETT  Additional Equipment: None  Intra-op Plan:   Post-operative Plan: Extubation in OR  Informed Consent: I have reviewed the patients History and Physical, chart, labs and discussed the  procedure including the risks, benefits and alternatives for the proposed anesthesia with the patient or authorized representative who has indicated his/her understanding and acceptance.   Patient has DNR.  Discussed DNR with patient and Suspend DNR.   Dental advisory given  Plan Discussed with:   Anesthesia Plan Comments:        Anesthesia Quick Evaluation

## 2020-08-08 ENCOUNTER — Encounter (HOSPITAL_COMMUNITY): Payer: Self-pay | Admitting: Surgery

## 2020-08-08 DIAGNOSIS — R1013 Epigastric pain: Secondary | ICD-10-CM | POA: Diagnosis not present

## 2020-08-08 DIAGNOSIS — I2510 Atherosclerotic heart disease of native coronary artery without angina pectoris: Secondary | ICD-10-CM | POA: Diagnosis not present

## 2020-08-08 DIAGNOSIS — M5440 Lumbago with sciatica, unspecified side: Secondary | ICD-10-CM

## 2020-08-08 DIAGNOSIS — K8020 Calculus of gallbladder without cholecystitis without obstruction: Secondary | ICD-10-CM | POA: Diagnosis not present

## 2020-08-08 DIAGNOSIS — G8929 Other chronic pain: Secondary | ICD-10-CM

## 2020-08-08 DIAGNOSIS — E782 Mixed hyperlipidemia: Secondary | ICD-10-CM

## 2020-08-08 LAB — CBC
HCT: 30.6 % — ABNORMAL LOW (ref 39.0–52.0)
Hemoglobin: 10.8 g/dL — ABNORMAL LOW (ref 13.0–17.0)
MCH: 32.2 pg (ref 26.0–34.0)
MCHC: 35.3 g/dL (ref 30.0–36.0)
MCV: 91.3 fL (ref 80.0–100.0)
Platelets: 120 10*3/uL — ABNORMAL LOW (ref 150–400)
RBC: 3.35 MIL/uL — ABNORMAL LOW (ref 4.22–5.81)
RDW: 12.3 % (ref 11.5–15.5)
WBC: 9.9 10*3/uL (ref 4.0–10.5)
nRBC: 0 % (ref 0.0–0.2)

## 2020-08-08 LAB — COMPREHENSIVE METABOLIC PANEL
ALT: 23 U/L (ref 0–44)
AST: 36 U/L (ref 15–41)
Albumin: 3.2 g/dL — ABNORMAL LOW (ref 3.5–5.0)
Alkaline Phosphatase: 70 U/L (ref 38–126)
Anion gap: 6 (ref 5–15)
BUN: 15 mg/dL (ref 8–23)
CO2: 24 mmol/L (ref 22–32)
Calcium: 8.5 mg/dL — ABNORMAL LOW (ref 8.9–10.3)
Chloride: 107 mmol/L (ref 98–111)
Creatinine, Ser: 1.11 mg/dL (ref 0.61–1.24)
GFR, Estimated: >60 mL/min (ref >60)
Glucose, Bld: 121 mg/dL — ABNORMAL HIGH (ref 70–99)
Potassium: 3.9 mmol/L (ref 3.5–5.1)
Sodium: 137 mmol/L (ref 135–145)
Total Bilirubin: 0.9 mg/dL (ref 0.3–1.2)
Total Protein: 5.9 g/dL — ABNORMAL LOW (ref 6.5–8.1)

## 2020-08-08 LAB — SURGICAL PATHOLOGY

## 2020-08-08 MED ORDER — ACETAMINOPHEN 325 MG PO TABS: 650.0000 mg | ORAL_TABLET | Freq: Four times a day (QID) | ORAL | 0 refills | Status: AC | PRN

## 2020-08-08 NOTE — Progress Notes (Addendum)
1 Day Post-Op   Subjective/Chief Complaint: Complains of incisional pain Able to walk to bathroom Tolerated breakfast   Objective: Vital signs in last 24 hours: Temp:  [97.3 F (36.3 C)-97.9 F (36.6 C)] 97.9 F (36.6 C) (12/28 0803) Pulse Rate:  [55-82] 82 (12/28 0803) Resp:  [8-20] 18 (12/28 0803) BP: (109-175)/(68-99) 112/72 (12/28 0803) SpO2:  [92 %-100 %] 95 % (12/28 0803) Last BM Date: 08/04/20  Intake/Output from previous day: 12/27 0701 - 12/28 0700 In: 1416.9 [I.V.:1182.3; IV Piggyback:234.6] Out: 20 [Blood:20] Intake/Output this shift: No intake/output data recorded.  Exam: Awake and alert Looks comfortable Abdomen soft, minimally tender  Lab Results:  Recent Labs    08/07/20 0232 08/08/20 0119  WBC 4.7 9.9  HGB 10.8* 10.8*  HCT 30.5* 30.6*  PLT 126* 120*   BMET Recent Labs    08/07/20 0232 08/08/20 0119  NA 138 137  K 3.1* 3.9  CL 103 107  CO2 26 24  GLUCOSE 102* 121*  BUN 22 15  CREATININE 1.20 1.11  CALCIUM 8.8* 8.5*   PT/INR No results for input(s): LABPROT, INR in the last 72 hours. ABG No results for input(s): PHART, HCO3 in the last 72 hours.  Invalid input(s): PCO2, PO2  Studies/Results: US Abdomen Limited RUQ (LIVER/GB)  Result Date: 08/06/2020 CLINICAL DATA:  79 year old with right upper quadrant pain. EXAM: ULTRASOUND ABDOMEN LIMITED RIGHT UPPER QUADRANT COMPARISON:  07/13/2019 and CT 08/03/2020 FINDINGS: Gallbladder: Mild to moderate distention of the gallbladder is similar to the recent CT. No gallbladder wall thickening. Reportedly, the patient does not have a sonographic Murphy sign. Non mobile and non shadowing echogenic structure near the base of the gallbladder measures up to 1.8 cm. There was a 0.9 cm echogenic structure in a similar location on the exam from 07/13/2019. Common bile duct: Diameter: 0.3 cm Liver: Limited evaluation of the liver due to bowel-gas. Liver is mildly heterogeneous. No discrete liver lesion.  Portal vein is patent on color Doppler imaging with normal direction of blood flow towards the liver. Other: None. IMPRESSION: 1. Stable distention of the gallbladder without wall thickening or sonographic Murphy sign. No evidence for acute cholecystitis. 2. 1.8 cm non mobile and non shadowing echogenic structure in the gallbladder. Differential diagnosis includes gallstone versus sludge ball versus large polyp. Since a polyp cannot be excluded, recommend surgical consultation. 3. No biliary dilatation. Electronically Signed   By: Richarda Overlie M.D.   On: 08/06/2020 13:40    Anti-infectives: Anti-infectives (From admission, onward)   Start     Dose/Rate Route Frequency Ordered Stop   08/06/20 1230  cefTRIAXone (ROCEPHIN) 1 g in sodium chloride 0.9 % 100 mL IVPB        1 g 200 mL/hr over 30 Minutes Intravenous  Once 08/06/20 1224 08/06/20 1342      Assessment/Plan: s/p Procedure(s): LAPAROSCOPIC CHOLECYSTECTOMY (N/A)  Doing well post op Ok from a surgical standpoint for discharge.  Issue is his medical status and living arrangements (he lives alone) Post op labs ok  LOS: 2 days    Abigail Miyamoto MD 08/08/2020

## 2020-08-08 NOTE — Discharge Summary (Signed)
Physician Discharge Summary  Riley Eaton [id number] DOB: Feb 23, 1941 DOA: 08/06/2020  PCP: Aletha Halim., PA-C  Admit date: 08/06/2020 Discharge date: 08/08/2020  Admitted From: Home Disposition: Home with Home Health  Recommendations for Outpatient Follow-up:  1. Follow up with PCP in 1-2 weeks 2. Follow up with General Surgery within 1-2 weeks 3. Follow-up with cardiology in 1 to 2 weeks as an outpatient 4. Please obtain CMP/CBC, Mag, Phos in one week 5. Please follow up on the following pending results:  Home Health:  YES Equipment/Devices: Conservation officer, nature with 5" Wheels    Discharge Condition: Stable CODE STATUS: DO NOT RESUSCITATE  Diet recommendation: Heart healthy diet  Brief/Interim Summary: HPI per Dr. Karmen Bongo on 08/06/20 Riley Eaton is a 79 y.o. male with medical history significant of MGUS; CAD; thoracic aortic aneurysm; CHF; BPH s/p TURP; and chronic pain presenting with abdominal pain.  He has been feeling nauseated for about 4-5 days.  He went to [facility] Thursday and they gave him antibiotics and a nausea pill.  He didn't seem to notice much improvement.  The last 2 nights he was very nauseated without emesis.  He was having upper abdominal pain.  Nothing made it better or worse.  No fever.  He has had anorexia.  He is tolerating water.  2 years ago, he had cholecystitis; they removed gallstones but they couldn't get his BP under good control and decided to do it as an outpatient.  COVID happened after that; his BP improved but surgeries weren't happening.He took 4 antibiotics pills Friday and Saturday.  He also had Rocephin at [facility] and today.  He was not noticing urinary symptoms.  He had a foley followed by a suprapubic catheter but has not had that in about 3 months.     ED Course:  H/o gallstones, abdominal pain and nausea x 1 week.  Seen at [facility] - UTI, given Keflex.  Worsening nausea, can't tolerate Keflex, worsening pain.  Generalized  discomfort, nothing focal.  RUQ [place] with stone vs. Polyp - needs surgical consultation.  Surgery to see, recommends HIDA, no cholecystitis.  Given Rocephin.  **Interim History Patient underwent a laparoscopic cholecystectomy and is doing well postoperatively.  His labs has improved and stabilized since his surgery.  PT OT recommending home health and he is stable for discharge as general surgery is cleared the patient for discharge.  He will need to follow-up with them within 1 to 2 weeks.  He did complain of some incisional pain but no real abdominal pain and felt much better and tolerated his diet without issues.  Discharge Diagnoses:  Active Problems:   Coronary artery calcification seen on CT scan   Chronic low back pain   Abdominal pain   Hyperlipidemia   MGUS (monoclonal gammopathy of unknown significance)   Chronic systolic CHF (congestive heart failure) (HCC)   Stage 3a chronic kidney disease (HCC)   Cholelithiasis without obstruction   RUQ abdominal pain  Acute Cholecystitis he is now status post laparoscopic cholecystectomy.   -He was supposed to have this procedure done 2 years ago. Has Incisional Pain.   -He is passing gas and urinating. -Ultrasound of the gallbladder showed 1.8 cm mobile nonshadowing echogenic structure in the gallbladder stone versus polyp versus sludge. -Underwent Cholecystectomy -General Surgery cleared the patient for Discharge and PT/OT recommending Home Health  BPH and TURP  -patient has history of urinary obstruction and has had suprapubic catheter in the past.  Currently he is able  to urinate well.  He just would like to be out of bed and sit in the bedside commode to urinate. -Continue Proscar.  MGUS  -followed by Dr. Marin Olp  History of CAD and combined congestive heart failure  -ejection fraction 40 to 45 % 8/21 -Cardiology following. 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  Hypokalemia -Resolved after repletion -Continue monitor and replete as necessary -Repeat CMP within 1 week   Discharge Instructions  Discharge Instructions    Call MD for:  difficulty breathing, headache or visual disturbances   Complete by: As directed    Call MD for:  extreme fatigue   Complete by: As directed    Call MD for:  hives   Complete by: As directed    Call MD for:  persistant dizziness or light-headedness   Complete by: As directed    Call MD for:  persistant nausea and vomiting   Complete by: As directed    Call MD for:  redness, tenderness, or signs of infection (pain, swelling, redness, odor or green/yellow discharge around incision site)   Complete by: As directed    Call MD for:  severe uncontrolled pain   Complete by: As directed    Call MD for:  temperature >100.4   Complete by: As directed    Diet - low sodium heart healthy   Complete by: As directed    Discharge instructions   Complete by: As directed    You were cared for by a hospitalist during your hospital stay. If you have any questions about your discharge medications or the care you received while you were in the hospital after you are discharged, you can call the unit and ask to speak with the hospitalist on call if the hospitalist that took care of you is not available. Once you are discharged, your primary care physician will handle any further medical issues. Please note that NO REFILLS for any discharge medications will be authorized once you are discharged, as it is imperative that you return to your primary care physician (or establish a relationship with a primary care physician if you do not have one) for your aftercare needs so that they can  reassess your need for medications and monitor your lab values.  Follow up with PCP, General Surgery, and Cardiology as an outpatient. Take all medications as prescribed. If symptoms change or worsen please return to the ED for evaluation   Discharge wound care:   Complete by: As directed    Per General Surgery   Increase activity slowly   Complete by: As directed      Allergies as of 08/08/2020   No Known Allergies     Medication List    TAKE these medications   acetaminophen 325 MG tablet Commonly known as: TYLENOL Take 2 tablets (650 mg total) by mouth every 6 (six) hours as needed for mild pain (or temp > 100).   allopurinol 100 MG tablet Commonly known as: ZYLOPRIM TAKE 1 TABLET BY MOUTH EVERY DAY   aspirin 81 MG chewable tablet Chew 1 tablet (81 mg total) by mouth daily.   atorvastatin 40 MG tablet Commonly known as: LIPITOR Take 1 tablet (  40 mg total) by mouth daily at 6 PM. What changed: when to take this   carvedilol 6.25 MG tablet Commonly known as: COREG Take 1 tablet by mouth 2 (two) times daily.   cephALEXin 500 MG capsule Commonly known as: KEFLEX Take 1 capsule (500 mg total) by mouth 4 (four) times daily for 7 days.   diphenhydramine-acetaminophen 25-500 MG Tabs tablet Commonly known as: TYLENOL PM Take 1 tablet by mouth at bedtime.   ferrous sulfate 325 (65 FE) MG EC tablet Take 325 mg by mouth daily.   finasteride 5 MG tablet Commonly known as: PROSCAR Take 1 tablet (5 mg total) by mouth daily. For urination   furosemide 40 MG tablet Commonly known as: LASIX Take 1 tablet (40 mg total) by mouth daily.   HYDROcodone-acetaminophen 5-325 MG tablet Commonly known as: NORCO/VICODIN Take 1 tablet by mouth 4 (four) times daily.   losartan 25 MG tablet Commonly known as: COZAAR Take 25 mg by mouth daily.   OLANZapine 5 MG tablet Commonly known as: ZYPREXA Take 1 tablet (5 mg total) by mouth daily.   ondansetron 4 MG disintegrating  tablet Commonly known as: Zofran ODT Take 1 tablet (4 mg total) by mouth every 8 (eight) hours as needed for nausea or vomiting.   pantoprazole 40 MG tablet Commonly known as: PROTONIX Take 1 tablet (40 mg total) by mouth at bedtime. What changed: when to take this   polyethylene glycol 17 g packet Commonly known as: MIRALAX / GLYCOLAX Take 17 g by mouth daily. What changed:   when to take this  reasons to take this   pregabalin 150 MG capsule Commonly known as: LYRICA Take 150 mg by mouth 2 (two) times daily.   traZODone 50 MG tablet Commonly known as: DESYREL Take 50 mg by mouth at bedtime.   vitamin B-12 500 MCG tablet Commonly known as: CYANOCOBALAMIN Take 500 mcg by mouth daily.            Durable Medical Equipment  (From admission, onward)         Start     Ordered   08/08/20 1356  For home use only DME Walker rolling  Once       Question Answer Comment  Walker: With [place] Wheels   Patient needs a walker to treat with the following condition Generalized weakness      08/08/20 1355           Discharge Care Instructions  (From admission, onward)         Start     Ordered   08/08/20 0000  Discharge wound care:       Comments: Per General Surgery   08/08/20 1402          Follow-up [facility] Go on 08/29/2020.   Specialty: General Surgery Why: 01/18 at 8:45 am. Please bring a copy of your photo ID and insurance card to the appointment. Please arrive 30 minutes early for paperwork.  Contact information: [place], [facility] Follow up.   Specialty: Home Health Services Why: [facility] will be providing you with home health Pt/OT.  They will call you in the next 24-48 hours to set up therapy times. Contact information: [address] [address] [place] [address] [phone]        Aletha Halim., PA-C.  Schedule an appointment  as soon as possible for a visit.   Specialty: Family Medicine Why: Please call and schedule a follow up appointment in the nexy 7-10 days with your primary care physician for a hospital follow up. Contact information: [address] [place] [address] [phone]              No Known Allergies  Consultations:  General Surgery  Cardiology   Procedures/Studies: CT ABDOMEN PELVIS W CONTRAST  Result Date: 08/03/2020 CLINICAL DATA:  Abdominal pain and vomiting. EXAM: CT ABDOMEN AND PELVIS WITH CONTRAST TECHNIQUE: Multidetector CT imaging of the abdomen and pelvis was performed using the standard protocol following bolus administration of intravenous contrast. CONTRAST:  182mL OMNIPAQUE IOHEXOL 300 MG/ML  SOLN COMPARISON:  July 18, 2019 FINDINGS: Lower chest: No acute abnormality. Hepatobiliary: No focal liver abnormality is seen. An ill-defined 7 mm gallstone versus gallbladder polyp is seen within the lumen of a moderately distended gallbladder. There is no evidence of gallbladder wall thickening or biliary dilatation. Pancreas: Unremarkable. No pancreatic ductal dilatation or surrounding inflammatory changes. Spleen: Normal in size without focal abnormality. Adrenals/Urinary Tract: Adrenal glands are unremarkable. Kidneys are normal in size, without renal calculi or hydronephrosis. A 1.0 cm cyst is seen along the anterolateral aspect of the mid right kidney. Mild anterior urinary bladder wall thickening is seen. 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 Appendix appears normal. No evidence of bowel wall thickening, distention, or inflammatory changes. Vascular/Lymphatic: Aortic atherosclerosis. No enlarged abdominal or pelvic lymph nodes. Reproductive: There is moderate to marked severity prostate gland enlargement. Other: No abdominal wall hernia or abnormality. No abdominopelvic ascites. Musculoskeletal: There is marked severity scoliosis involving  the lumbar spine with marked severity multilevel degenerative changes. Extensive chronic changes are also seen at the level of L3-L4. IMPRESSION: 1. Large gastric hernia. 2. Subcentimeter gallstone versus gallbladder polyp. Correlation with right upper quadrant ultrasound is recommended. 3. Marked severity scoliosis with marked severity multilevel degenerative changes of the lumbar spine. 4. Enlarged prostate gland. 5. Aortic atherosclerosis. Aortic Atherosclerosis (ICD10-I70.0). Electronically Signed   By: Virgina Norfolk M.D.   On: 08/03/2020 20:54   US Abdomen Limited RUQ (LIVER/GB)  Result Date: 08/06/2020 CLINICAL DATA:  79 year old with right upper quadrant pain. EXAM: ULTRASOUND ABDOMEN LIMITED RIGHT UPPER QUADRANT COMPARISON:  07/13/2019 and CT 08/03/2020 FINDINGS: Gallbladder: Mild to moderate distention of the gallbladder is similar to the recent CT. No gallbladder wall thickening. Reportedly, the patient does not have a sonographic Murphy sign. Non mobile and non shadowing echogenic structure near the base of the gallbladder measures up to 1.8 cm. There was a 0.9 cm echogenic structure in a similar location on the exam from 07/13/2019. Common bile duct: Diameter: 0.3 cm Liver: Limited evaluation of the liver due to bowel-gas. Liver is mildly heterogeneous. No discrete liver lesion. Portal vein is patent on color Doppler imaging with normal direction of blood flow towards the liver. Other: None. IMPRESSION: 1. Stable distention of the gallbladder without wall thickening or sonographic Murphy sign. No evidence for acute cholecystitis. 2. 1.8 cm non mobile and non shadowing echogenic structure in the gallbladder. Differential diagnosis includes gallstone versus sludge ball versus large polyp. Since a polyp cannot be excluded, recommend surgical consultation. 3. No biliary dilatation. Electronically Signed   By: Markus Daft M.D.   On: 08/06/2020 13:40    Subjective: Patient was seen and examined and  doing well postoperatively and tolerated his diet.  No nausea or vomiting and only complaint of some incisional pain.  Felt well and ambulated with physical therapy.  Ready to go home  Discharge Exam: Vitals:   08/08/20 0803 08/08/20 1524  BP: 112/72 113/75  Pulse: 82 86  Resp: 18 18  Temp: 97.9 F (36.6 C) 98 F (36.7 C)  SpO2: 95% 96%   Vitals:   08/07/20 2206 08/08/20 0632 08/08/20 0803 08/08/20 1524  BP: 116/68 109/70 112/72 113/75  Pulse: 63 61 82 86  Resp: 18 18 18 18   Temp: 97.9 F (36.6 C) 97.7 F (36.5 C) 97.9 F (36.6 C) 98 F (36.7 C)  TempSrc: Oral Oral Oral Oral  SpO2: 95% 92% 95% 96%  Weight:      Height:        General: Pt is alert, awake, not in acute distress Cardiovascular: RRR, S1/S2 +, no rubs, no gallops Respiratory: Slightly diminished bilaterally, no wheezing, no rhonchi Abdominal: Soft, NT, distended secondary to body habitus and does have some incisions noted from his laparoscopic cholecystectomy, bowel sounds + Extremities: no edema, no cyanosis   The results of significant diagnostics from this hospitalization (including imaging, microbiology, ancillary and laboratory) are listed below for reference.     Microbiology: Recent Results (from the past 240 hour(s))  Urine culture     Status: Abnormal   Collection Time: 08/06/20 12:24 PM   Specimen: Urine, Random  Result Value Ref Range Status   Specimen Description URINE, RANDOM  Final   Special Requests NONE  Final   Culture (A)  Final    <10,000 COLONIES/mL INSIGNIFICANT GROWTH Performed at [facility], [place] [address] [place], [place] [address]    Report Status 08/07/2020 FINAL  Final  Resp Panel by RT-PCR (Flu A&B, Covid) Nasopharyngeal Swab     Status: None   Collection Time: 08/06/20  2:52 PM   Specimen: Nasopharyngeal Swab; Nasopharyngeal(NP) swabs in vial transport medium  Result Value Ref Range Status   SARS Coronavirus 2 by RT PCR NEGATIVE NEGATIVE Final    Comment:  (NOTE) SARS-CoV-2 target nucleic acids are NOT DETECTED.  The SARS-CoV-2 RNA is generally detectable in upper respiratory specimens during the acute phase of infection. The lowest concentration of SARS-CoV-2 viral copies this assay can detect is 138 copies/mL. A negative result does not preclude SARS-Cov-2 infection and should not be used as the sole basis for treatment or other patient management decisions. A negative result may occur with  improper specimen collection/handling, submission of specimen other than nasopharyngeal swab, presence of viral mutation(s) within the areas targeted by this assay, and inadequate number of viral copies(<138 copies/mL). A negative result must be combined with clinical observations, patient history, and epidemiological information. The expected result is Negative.  Fact Sheet for Patients:  EntrepreneurPulse.com.au  Fact Sheet for Healthcare Providers:  IncredibleEmployment.be  This test is no t yet approved or cleared by the [place] FDA and  has been authorized for detection and/or diagnosis of SARS-CoV-2 by FDA under an Emergency Use Authorization (EUA). This EUA will remain  in effect (meaning this test can be used) for the duration of the COVID-19 declaration under Section 564(b)(1) of the Act, 21 U.S.C.section 360bbb-3(b)(1), unless the authorization is terminated  or revoked sooner.       Influenza A by PCR NEGATIVE NEGATIVE Final   Influenza B by PCR NEGATIVE NEGATIVE Final    Comment: (NOTE) The Xpert Xpress SARS-CoV-2/FLU/RSV plus assay is intended as an aid in the diagnosis of influenza from Nasopharyngeal swab specimens and should not be used as a  sole basis for treatment. Nasal washings and aspirates are unacceptable for Xpert Xpress SARS-CoV-2/FLU/RSV testing.  Fact Sheet for Patients: EntrepreneurPulse.com.au  Fact Sheet for Healthcare  Providers: IncredibleEmployment.be  This test is not yet approved or cleared by the [place] FDA and has been authorized for detection and/or diagnosis of SARS-CoV-2 by FDA under an Emergency Use Authorization (EUA). This EUA will remain in effect (meaning this test can be used) for the duration of the COVID-19 declaration under Section 564(b)(1) of the Act, 21 U.S.C. section 360bbb-3(b)(1), unless the authorization is terminated or revoked.  Performed at [facility], [place] [address] [place], [place] [address]   Surgical pcr screen     Status: None   Collection Time: 08/06/20 10:02 PM   Specimen: Nasal Mucosa; Nasal Swab  Result Value Ref Range Status   MRSA, PCR NEGATIVE NEGATIVE Final   Staphylococcus aureus NEGATIVE NEGATIVE Final    Comment: (NOTE) The Xpert SA Assay (FDA approved for NASAL specimens in patients 62 years of age and older), is one component of a comprehensive surveillance program. It is not intended to diagnose infection nor to guide or monitor treatment. Performed at [facility], [place] [address] [place], [place] [address]    Labs: BNP (last 3 results) No results for input(s): BNP in the last 8760 hours. Basic Metabolic Panel: Recent Labs  Lab 08/03/20 1536 08/06/20 0422 08/07/20 0232 08/08/20 0119  NA 138 138 138 137  K 3.8 3.8 3.1* 3.9  CL 103 105 103 107  CO2 26 18* 26 24  GLUCOSE 138* 108* 102* 121*  BUN 28* 26* 22 15  CREATININE 1.14 1.39* 1.20 1.11  CALCIUM 9.3 9.2 8.8* 8.5*  MG  --   --  2.2  --    Liver Function Tests: Recent Labs  Lab 08/03/20 1536 08/06/20 0422 08/07/20 0232 08/08/20 0119  AST 26 33 23 36  ALT 15 17 14 23   ALKPHOS 95 90 78 70  BILITOT 1.1 1.4* 1.0 0.9  PROT 7.6 6.7 5.9* 5.9*  ALBUMIN 4.5 4.2 3.5 3.2*   Recent Labs  Lab 08/03/20 1536 08/06/20 0422  LIPASE 18 19   No results for input(s): AMMONIA in the last 168 hours. CBC: Recent Labs  Lab 08/03/20 1536  08/06/20 0422 08/07/20 0232 08/08/20 0119  WBC 7.0 5.3 4.7 9.9  HGB 13.0 12.4* 10.8* 10.8*  HCT 36.1* 35.3* 30.5* 30.6*  MCV 90.7 92.4 90.5 91.3  PLT 153 136* 126* 120*   Cardiac Enzymes: No results for input(s): CKTOTAL, CKMB, CKMBINDEX, TROPONINI in the last 168 hours. BNP: Invalid input(s): POCBNP CBG: No results for input(s): GLUCAP in the last 168 hours. D-Dimer No results for input(s): DDIMER in the last 72 hours. Hgb A1c No results for input(s): HGBA1C in the last 72 hours. Lipid Profile No results for input(s): CHOL, HDL, LDLCALC, TRIG, CHOLHDL, LDLDIRECT in the last 72 hours. Thyroid function studies No results for input(s): TSH, T4TOTAL, T3FREE, THYROIDAB in the last 72 hours.  Invalid input(s): FREET3 Anemia work up No results for input(s): VITAMINB12, FOLATE, FERRITIN, TIBC, IRON, RETICCTPCT in the last 72 hours. Urinalysis    Component Value Date/Time   COLORURINE YELLOW 08/06/2020 1326   APPEARANCEUR HAZY (A) 08/06/2020 1326   LABSPEC 1.023 08/06/2020 1326   PHURINE 5.0 08/06/2020 1326   GLUCOSEU NEGATIVE 08/06/2020 1326   HGBUR NEGATIVE 08/06/2020 1326   BILIRUBINUR NEGATIVE 08/06/2020 1326   KETONESUR 20 (A) 08/06/2020 1326   PROTEINUR NEGATIVE 08/06/2020  1326   NITRITE NEGATIVE 08/06/2020 1326   LEUKOCYTESUR SMALL (A) 08/06/2020 1326   Sepsis Labs Invalid input(s): PROCALCITONIN,  WBC,  LACTICIDVEN Microbiology Recent Results (from the past 240 hour(s))  Urine culture     Status: Abnormal   Collection Time: 08/06/20 12:24 PM   Specimen: Urine, Random  Result Value Ref Range Status   Specimen Description URINE, RANDOM  Final   Special Requests NONE  Final   Culture (A)  Final    <10,000 COLONIES/mL INSIGNIFICANT GROWTH Performed at [facility], [address] [place], [place] [address]    Report Status 08/07/2020 FINAL  Final  Resp Panel by RT-PCR (Flu A&B, Covid) Nasopharyngeal Swab     Status: None   Collection Time: 08/06/20  2:52  PM   Specimen: Nasopharyngeal Swab; Nasopharyngeal(NP) swabs in vial transport medium  Result Value Ref Range Status   SARS Coronavirus 2 by RT PCR NEGATIVE NEGATIVE Final    Comment: (NOTE) SARS-CoV-2 target nucleic acids are NOT DETECTED.  The SARS-CoV-2 RNA is generally detectable in upper respiratory specimens during the acute phase of infection. The lowest concentration of SARS-CoV-2 viral copies this assay can detect is 138 copies/mL. A negative result does not preclude SARS-Cov-2 infection and should not be used as the sole basis for treatment or other patient management decisions. A negative result may occur with  improper specimen collection/handling, submission of specimen other than nasopharyngeal swab, presence of viral mutation(s) within the areas targeted by this assay, and inadequate number of viral copies(<138 copies/mL). A negative result must be combined with clinical observations, patient history, and epidemiological information. The expected result is Negative.  Fact Sheet for Patients:  BloggerCourse.com  Fact Sheet for Healthcare Providers:  SeriousBroker.it  This test is no t yet approved or cleared by the [place] FDA and  has been authorized for detection and/or diagnosis of SARS-CoV-2 by FDA under an Emergency Use Authorization (EUA). This EUA will remain  in effect (meaning this test can be used) for the duration of the COVID-19 declaration under Section 564(b)(1) of the Act, 21 U.S.C.section 360bbb-3(b)(1), unless the authorization is terminated  or revoked sooner.       Influenza A by PCR NEGATIVE NEGATIVE Final   Influenza B by PCR NEGATIVE NEGATIVE Final    Comment: (NOTE) The Xpert Xpress SARS-CoV-2/FLU/RSV plus assay is intended as an aid in the diagnosis of influenza from Nasopharyngeal swab specimens and should not be used as a sole basis for treatment. Nasal washings and aspirates are  unacceptable for Xpert Xpress SARS-CoV-2/FLU/RSV testing.  Fact Sheet for Patients: BloggerCourse.com  Fact Sheet for Healthcare Providers: SeriousBroker.it  This test is not yet approved or cleared by the [place] FDA and has been authorized for detection and/or diagnosis of SARS-CoV-2 by FDA under an Emergency Use Authorization (EUA). This EUA will remain in effect (meaning this test can be used) for the duration of the COVID-19 declaration under Section 564(b)(1) of the Act, 21 U.S.C. section 360bbb-3(b)(1), unless the authorization is terminated or revoked.  Performed at [facility], [address] [place], [place] [address]   Surgical pcr screen     Status: None   Collection Time: 08/06/20 10:02 PM   Specimen: Nasal Mucosa; Nasal Swab  Result Value Ref Range Status   MRSA, PCR NEGATIVE NEGATIVE Final   Staphylococcus aureus NEGATIVE NEGATIVE Final    Comment: (NOTE) The Xpert SA Assay (FDA approved for NASAL specimens in patients 22 years of  age and older), is one component of a comprehensive surveillance program. It is not intended to diagnose infection nor to guide or monitor treatment. Performed at [facility], [place] [address], [place], [place] [address]    Time coordinating discharge: 35 minutes  SIGNED:  Kerney Elbe, DO [facility] 08/08/2020, 7:30 PM Pager is on AMION  If 7PM-7AM, please contact night-coverage [email or web address]

## 2020-08-08 NOTE — TOC Initial Note (Signed)
Transition of Care [facility]) - Initial/Assessment Note    Patient Details  Name: Riley Eaton MRN: [id number] Date of Birth: 09-11-1940  Transition of Care [facility]) CM/SW Contact:    Curlene Labrum, RN Phone Number: 08/08/2020, 2:41 PM  Clinical Narrative:                 Case management met with the patient at the bedside regarding transitions of care to home.  The patient is a S/P Lap cholecystectomy and plans to discharge home and will have family assistance and 24 hour supervision at home.  The patient was given medicare choice regarding home health and did not have a preference - I called [facility] and spoke with Tommi Rumps and patient was set up with home health PT/OT and they will call the patient's home number to clarify visit times.  Patient has a rolling walker at home.  The patient is ready for discharge to home and will have family pick him up for discharge.  Expected Discharge Plan: [place] Barriers to Discharge: No Barriers Identified   Patient Goals and CMS Choice Patient states their goals for this hospitalization and ongoing recovery are:: Patient plans to discharge home today with family. CMS Medicare.gov Compare Post Acute Care list provided to:: Patient Choice offered to / list presented to : Patient  Expected Discharge Plan and Services Expected Discharge Plan: [place]   Discharge Planning Services: CM Consult Post Acute Care Choice: [place] arrangements for the past 2 months: Single Family Home Expected Discharge Date: 08/08/20                         HH Arranged: PT,OT [place] Agency: [facility] Agency Contacted: 08/08/20 Time HH Agency Contacted: [phone] Representative spoke with at [place]: Tommi Rumps, [place] with [facility]  Prior Living Arrangements/Services Living arrangements for the past 2 months: Single Family Home Lives with:: Self (family available for 24 hour supervision.) Patient  language and need for interpreter reviewed:: Yes Do you feel safe going back to the place where you live?: Yes      Need for Family Participation in Patient Care: Yes (Comment) Care giver support system in place?: Yes (comment) Current home services: DME (has rolling walker at home.) Criminal Activity/Legal Involvement Pertinent to Current Situation/Hospitalization: No - Comment as needed  Activities of Daily Living      Permission Sought/Granted Permission sought to share information with : Case Manager Permission granted to share information with : Yes, Verbal Permission Granted     Permission granted to share info w AGENCY: home health company  Permission granted to share info w Relationship: family     Emotional Assessment Appearance:: Appears stated age Attitude/Demeanor/Rapport: Gracious Affect (typically observed): Accepting Orientation: : Oriented to Self,Oriented to Place,Oriented to  Time,Oriented to Situation Alcohol / Substance Use: Not Applicable Psych Involvement: No (comment)  Admission diagnosis:  Cholelithiasis without obstruction [K80.20] RUQ abdominal pain [R10.11] Abdominal pain [R10.9] Patient Active Problem List   Diagnosis Date Noted  . RUQ abdominal pain   . Chronic systolic CHF (congestive heart failure) ([place]) 08/06/2020  . Stage 3a chronic kidney disease ([place]) 08/06/2020  . Cholelithiasis without obstruction 08/06/2020  . 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 Goals of care, counseling/discussion 07/13/2020  . 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 Hyperlipidemia 08/31/2019  . Non-ST elevation (NSTEMI) myocardial infarction (  HCC)   . Hypokalemia   . DCM (dilated cardiomyopathy) ([place])   . Abdominal pain   . Encephalopathy   . Endotracheal tube present   . Alteration in nutrition   . ACS (acute coronary syndrome) ([place]) 07/14/2019  . Mental status alteration 07/13/2019  . Acute  metabolic encephalopathy 57/49/3552  . Protein-calorie malnutrition, severe 09/28/2018  . Nausea & vomiting 09/25/2018  . Choledocholithiasis 09/25/2018  . Chronic pain 09/25/2018  . Urinary retention 09/25/2018  . Constipation 09/25/2018  . Intractable vomiting   . 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 Thoracic aortic aneurysm ([place]) 10/03/2015  . Coronary artery calcification seen on CT scan 10/03/2015   PCP:  Aletha Halim., PA-C Pharmacy:   [id number] [place], [place] - [address] [place] [address]Phone: [phone]: [phone]e: [phone]: [phone]    Social Determinants of Health ([place] Interventions    Readmission Risk Interventions Readmission Risk Prevention Plan 08/08/2020  Transportation Screening Complete  PCP or Specialist Appt within 5-7 Days Complete  Home Care Screening Complete  Medication Review (RN CM) Complete  Some recent data might be hidden

## 2020-08-08 NOTE — Evaluation (Signed)
Physical Therapy Evaluation Patient Details Name: Riley Eaton MRN: [id number] DOB: May 12, 1941 Today's Date: 08/08/2020   History of Present Illness  Willliam R Eaton is a 79 y.o. male with medical history significant of MGUS; CAD; thoracic aortic aneurysm; CHF; BPH s/p TURP; and chronic pain presenting with abdominal pain.  He has been feeling nauseated for about 4-5 days. pt diagnosed with cholecystitis and is now status post laparoscopic cholecystectomy.  Clinical Impression   Patient received up in room walking with OT, pleasant and cooperative. Does have a tendency to push 2-wheeled RW too far ahead of him but in context is used to using rollator at baseline. Needed multiple cues for proximity to device, but did well with walking in the hallway. Can be a little quick with his movements, question if this is baseline. Left in bed with all needs met this afternoon. Will benefit from HHPT and S for OOB mobility moving forward.     Follow Up Recommendations Home health PT;Supervision for mobility/OOB    Equipment Recommendations  Rolling walker with 5" wheels    Recommendations for Other Services       Precautions / Restrictions Precautions Precautions: Fall;Other (comment) Precaution Comments: abdominal incision Restrictions Weight Bearing Restrictions: No      Mobility  Bed Mobility Overal bed mobility: Needs Assistance Bed Mobility: Sit to Supine     Supine to sit: Supervision Sit to supine: Supervision   General bed mobility comments: Supervision for safety with lines due to quick movements    Transfers Overall transfer level: Needs assistance Equipment used: Rolling walker (2 wheeled) Transfers: Sit to/from Stand Sit to Stand: Min guard         General transfer comment: min guard for safety due to impulsivity and cues for RW use as pt tendency to leave RW behind or push too far in front  Ambulation/Gait Ambulation/Gait assistance: Min guard Gait  Distance (Feet): 160 Feet Assistive device: Rolling walker (2 wheeled) Gait Pattern/deviations: Step-through pattern;Decreased step length - right;Decreased step length - left;Decreased stride length;Trunk flexed;Wide base of support Gait velocity: decreased   General Gait Details: slow but steady with RW, needed multiple cues for keeping RW at an appropriate distance as he tends to push it far ahead of him (but he is used to using a rollator at baseline)  Financial trader Rankin (Stroke Patients Only)       Balance Overall balance assessment: Needs assistance Sitting-balance support: No upper extremity supported;Feet supported Sitting balance-Leahy Scale: Good     Standing balance support: Single extremity supported;Bilateral upper extremity supported;During functional activity Standing balance-Leahy Scale: Fair Standing balance comment: fair static standing unsupported, benefits from UE support for dynamic tasks                             Pertinent Vitals/Pain Pain Assessment: No/denies pain    Home Living Family/patient expects to be discharged to:: Private residence Living Arrangements: Alone Available Help at Discharge: Family;Available PRN/intermittently Type of Home: House Home Access: Ramped entrance     Home Layout: One level Home Equipment: [place] - 4 wheels;Cane - single point;Shower seat Additional Comments: Sons live nearby, one works and one is retired from Event organ[employer]. One son stayed with pt overnight prior to admission and they frequently check on pt    Prior Function Level of Independence: Independent with assistive device(s)  Comments: Uses Rollator or cane for mobility pending back pain. Pt reports Modified Independence with ADLs, IADLs. Was driving and grocery shopping     Hand Dominance   Dominant Hand: Right    Extremity/Trunk Assessment   Upper Extremity Assessment Upper  Extremity Assessment: Defer to OT evaluation    Lower Extremity Assessment Lower Extremity Assessment: Overall WFL for tasks assessed    Cervical / Trunk Assessment Cervical / Trunk Assessment: Kyphotic  Communication   Communication: HOH  Cognition Arousal/Alertness: Awake/alert Behavior During Therapy: WFL for tasks assessed/performed Overall Cognitive Status: Within Functional Limits for tasks assessed                                 General Comments: A&Ox4, pleasant. A bit quick with movements, decreased awareness of DME need and safety      General Comments General comments (skin integrity, edema, etc.): encouraged patient to have his son stay with him for a few days/nights for increased safety    Exercises     Assessment/Plan    PT Assessment Patient needs continued PT services  PT Problem List Decreased strength;Decreased knowledge of use of DME;Decreased activity tolerance;Decreased safety awareness;Decreased balance;Decreased mobility;Decreased coordination       PT Treatment Interventions DME instruction;Balance training;Gait training;Stair training;Cognitive remediation;Functional mobility training;Patient/family education;Therapeutic activities;Therapeutic exercise    PT Goals (Current goals can be found in the Care Plan section)  Acute Rehab PT Goals Patient Stated Goal: be able to go home PT Goal Formulation: With patient Time For Goal Achievement: 08/22/20 Potential to Achieve Goals: Good    Frequency Min 3X/week   Barriers to discharge        Co-evaluation               AM-PAC PT "6 Clicks" Mobility  Outcome Measure Help needed turning from your back to your side while in a flat bed without using bedrails?: None Help needed moving from lying on your back to sitting on the side of a flat bed without using bedrails?: A Little Help needed moving to and from a bed to a chair (including a wheelchair)?: A Little Help needed standing  up from a chair using your arms (e.g., wheelchair or bedside chair)?: A Little Help needed to walk in hospital room?: A Little Help needed climbing 3-5 steps with a railing? : A Little 6 Click Score: 19    End of Session Equipment Utilized During Treatment: Gait belt Activity Tolerance: Patient tolerated treatment well Patient left: in bed;with call bell/phone within reach;with bed alarm set Nurse Communication: Mobility status PT Visit Diagnosis: Unsteadiness on feet (R26.81);Other abnormalities of gait and mobility (R26.89);Muscle weakness (generalized) (M62.81)    Time: 7616-0737 PT Time Calculation (min) (ACUTE ONLY): 10 min   Charges:   PT Evaluation $PT Eval Low Complexity: 1 Low          Windell Norfolk, DPT, PN1   Supplemental Physical Therapist [place]    Pager [phone] Acute Rehab Office [phone]

## 2020-08-08 NOTE — Progress Notes (Signed)
Eval complete, formal note pending. Will benefit from HHPT and S for mobility as well as RW.   Madelaine Etienne, DPT, PN1   Supplemental Physical Therapist [facility]    Pager [phone] Acute Rehab Office [phone]

## 2020-08-08 NOTE — Progress Notes (Signed)
Patient discharged off the floor with the assistance of NT. Patient received all necessary AVS information regarding follow-up appointments and any changes to medications.  Patient verbalized understanding of information with no questions at this time.  Patient's family will be providing transportation home upon arrival to facility.

## 2020-08-08 NOTE — Evaluation (Signed)
Occupational Therapy Evaluation/Discharge Patient Details Name: Riley Eaton MRN: [id number] DOB: [birth year] Today's Date: 08/08/2020    History of Present Illness Riley Eaton is a 79 y.o. male with medical history significant of MGUS; CAD; thoracic aortic aneurysm; CHF; BPH s/p TURP; and chronic pain presenting with abdominal pain.  He has been feeling nauseated for about 4-5 days. pt diagnosed with cholecystitis and is now status post laparoscopic cholecystectomy.   Clinical Impression   PTA, pt lives alone and reports Modified Independence with ADLs, IADLs and mobility using Rollator vs cane pending back pain. Pt presents now with mild deficits in dynamic standing balance and safety with DME. Pt overall Supervision for bed mobility, min guard for mobility using RW. Pt benefits from safety cues for pacing and RW use. Pt requires no more than Supervision for ADLs at this time. Recommend initial supervision for mobility and assistance with IADLs at home to ensure safety in returning to PLOF. Recommend HHOT follow-up to further address home setup and IADLs. All further rehab needs can be met in next venue. OT to sign off.     Follow Up Recommendations  Home health OT;Other (comment) (24/7 supervision/assistance with IADLs for first few days once discharged home)    Equipment Recommendations  Other (comment) (RW if does not already have)    Recommendations for Other Services       Precautions / Restrictions Precautions Precautions: Fall Restrictions Weight Bearing Restrictions: No      Mobility Bed Mobility Overal bed mobility: Needs Assistance Bed Mobility: Supine to Sit;Sit to Supine     Supine to sit: Supervision Sit to supine: Supervision   General bed mobility comments: Supervision for safety with lines due to quick movements    Transfers Overall transfer level: Needs assistance Equipment used: Rolling walker (2 wheeled) Transfers: Sit to/from Stand Sit to  Stand: Min guard         General transfer comment: min guard for safety due to impulsivity and cues for RW use as pt tendency to leave RW behind or push too far in front    Balance Overall balance assessment: Needs assistance Sitting-balance support: No upper extremity supported;Feet supported Sitting balance-Leahy Scale: Good     Standing balance support: Single extremity supported;Bilateral upper extremity supported;During functional activity Standing balance-Leahy Scale: Fair Standing balance comment: fair static standing unsupported, benefits from UE support for dynamic tasks                           ADL either performed or assessed with clinical judgement   ADL Overall ADL's : Needs assistance/impaired Eating/Feeding: Independent;Sitting   Grooming: Supervision/safety;Standing   Upper Body Bathing: Independent;Sitting   Lower Body Bathing: Supervison/ safety;Sit to/from stand;Sitting/lateral leans   Upper Body Dressing : Set up;Sitting   Lower Body Dressing: Supervision/safety;Sit to/from stand   Toilet Transfer: Min guard;Ambulation;Comfort height toilet;RW Armed forces technical officer Details (indicate cue type and reason): simulated in room Toileting- Clothing Manipulation and Hygiene: Supervision/safety;Sitting/lateral lean;Sit to/from stand       Functional mobility during ADLs: Min guard;Rolling walker;Cueing for safety General ADL Comments: Pt with mild deficits in dynamic standing balance and safety with DME use     Vision Baseline Vision/History: Wears glasses Wears Glasses: At all times Patient Visual Report: No change from baseline Vision Assessment?: No apparent visual deficits     Perception     Praxis      Pertinent Vitals/Pain Pain Assessment: No/denies pain  Hand Dominance Right   Extremity/Trunk Assessment Upper Extremity Assessment Upper Extremity Assessment: Overall WFL for tasks assessed   Lower Extremity Assessment Lower  Extremity Assessment: Defer to PT evaluation   Cervical / Trunk Assessment Cervical / Trunk Assessment: Kyphotic   Communication Communication Communication: HOH   Cognition Arousal/Alertness: Awake/alert Behavior During Therapy: WFL for tasks assessed/performed Overall Cognitive Status: Within Functional Limits for tasks assessed                                 General Comments: A&Ox4, pleasant. A bit quick with movements, decreased awareness of DME need   General Comments  Encouraged pt to use walk in shower with seat to maximize safety during showering tasks. Encouraged pt's son to stay with pt for a few nights and assist with IADLs until return to [facility]    Exercises     Shoulder Instructions      Home Living Family/patient expects to be discharged to:: Private residence Living Arrangements: Alone Available Help at Discharge: Family;Available PRN/intermittently Type of Home: House Home Access: Ramped entrance     Home Layout: One level     Bathroom Shower/Tub: Tub/shower unit;Walk-in shower   Bathroom Toilet: Handicapped height     Home Equipment: Environmental consultant - 4 wheels;Cane - single point;Shower seat   Additional Comments: Sons live nearby, one works and one is retired from Event organiser. One son stayed with pt overnight prior to admission and they frequently check on pt      Prior Functioning/Environment Level of Independence: Independent with assistive device(s)        Comments: Uses Rollator or cane for mobility pending back pain. Pt reports Modified [place] with ADLs, IADLs. Was driving and grocery shopping        OT Problem List: Decreased activity tolerance;Impaired balance (sitting and/or standing);Decreased knowledge of use of DME or AE;Decreased safety awareness      OT Treatment/Interventions:      OT Goals(Current goals can be found in the care plan section) Acute Rehab OT Goals Patient Stated Goal: be able to go home OT Goal  Formulation: With patient  OT Frequency:     Barriers to D/C:            Co-evaluation              AM-PAC OT "6 Clicks" Daily Activity     Outcome Measure Help from another person eating meals?: None Help from another person taking care of personal grooming?: A Little Help from another person toileting, which includes using toliet, bedpan, or urinal?: A Little Help from another person bathing (including washing, rinsing, drying)?: A Little Help from another person to put on and taking off regular upper body clothing?: A Little Help from another person to put on and taking off regular lower body clothing?: A Little 6 Click Score: 19   End of Session Equipment Utilized During Treatment: Gait belt;Rolling walker Nurse Communication: Mobility status  Activity Tolerance: Patient tolerated treatment well Patient left: in bed;with call bell/phone within reach;with bed alarm set  OT Visit Diagnosis: Unsteadiness on feet (R26.81);Other abnormalities of gait and mobility (R26.89)                Time: 1255-1316 OT Time Calculation (min): 21 min Charges:  OT General Charges $OT Visit: 1 Visit OT Evaluation $OT Eval Low Complexity: 1 Low  Layla Maw, OTR/L  Layla Maw 08/08/2020, 1:29 PM

## 2020-08-08 NOTE — Anesthesia Postprocedure Evaluation (Signed)
Anesthesia Post Note  Patient: Riley Eaton  Procedure(s) Performed: LAPAROSCOPIC CHOLECYSTECTOMY (N/A )     Patient location during evaluation: PACU Anesthesia Type: General Level of consciousness: awake and alert Pain management: pain level controlled Vital Signs Assessment: post-procedure vital signs reviewed and stable Respiratory status: spontaneous breathing, nonlabored ventilation and respiratory function stable Cardiovascular status: blood pressure returned to baseline and stable Postop Assessment: no apparent nausea or vomiting Anesthetic complications: no   No complications documented.  Last Vitals:  Vitals:   08/08/20 0632 08/08/20 0803  BP: 109/70 112/72  Pulse: 61 82  Resp: 18 18  Temp: 36.5 C 36.6 C  SpO2: 92% 95%    Last Pain:  Vitals:   08/08/20 0803  TempSrc: Oral  PainSc:                  Lucretia Kern

## 2020-09-12 ENCOUNTER — Other Ambulatory Visit: Payer: Self-pay

## 2020-09-12 ENCOUNTER — Ambulatory Visit: Payer: Medicare PPO | Admitting: Cardiovascular Disease

## 2020-09-12 ENCOUNTER — Encounter: Payer: Self-pay | Admitting: Cardiovascular Disease

## 2020-09-12 DIAGNOSIS — I5022 Chronic systolic (congestive) heart failure: Secondary | ICD-10-CM | POA: Diagnosis not present

## 2020-09-12 DIAGNOSIS — I712 Thoracic aortic aneurysm, without rupture, unspecified: Secondary | ICD-10-CM

## 2020-09-12 DIAGNOSIS — E782 Mixed hyperlipidemia: Secondary | ICD-10-CM | POA: Diagnosis not present

## 2020-09-12 NOTE — Assessment & Plan Note (Signed)
History of hyperlipidemia on statin therapy with lipid profile performed 09/03/2019 revealing total cholesterol 103, LDL 32 and HDL 58.

## 2020-09-12 NOTE — Patient Instructions (Signed)
Medication Instructions:  Your physician recommends that you continue on your current medications as directed. Please refer to the Current Medication list given to you today.  *If you need a refill on your cardiac medications before your next appointment, please call your pharmacy*   Follow-Up: At [facility], you and your health needs are our priority.  As part of our continuing mission to provide you with exceptional heart care, we have created designated Provider Care Teams.  These Care Teams include your primary Cardiologist (physician) and Advanced Practice Providers (APPs -  Physician Assistants and Nurse Practitioners) who all work together to provide you with the care you need, when you need it.  We recommend signing up for the patient portal called "MyChart".  Sign up information is provided on this After Visit Summary.  MyChart is used to connect with patients for Virtual Visits (Telemedicine).  Patients are able to view lab/test results, encounter notes, upcoming appointments, etc.  Non-urgent messages can be sent to your provider as well.   To learn more about what you can do with MyChart, go to [email or web address].    Your next appointment:   6 month(s)  The format for your next appointment:   In Person  Provider:   You will see one of the following Advanced Practice Providers on your designated Care Team:    Luke Kilroy, PA-C  Callie Goodrich, PA-C  Jesse Cleaver, FNP  Then, Jonathan Berry, MD will plan to see you again in 12 month(s). 

## 2020-09-12 NOTE — Assessment & Plan Note (Signed)
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

## 2020-09-12 NOTE — Progress Notes (Signed)
09/12/2020 [name]   November 07, 1940  [id number]  Primary Physician Aletha Halim., PA-C Primary Cardiologist: Lorretta Harp MD FACP, [place], [place], [place]  HPI:  Riley Eaton is a 80 y.o.  mildly overweight married Caucasian male father of 23, grandfather of 4 grandchildren. Retired from being in the [employer]. He is referred by Leonia Reader PA-C at [facility] for cardiovascular evaluation because of an episode of shortness of breath, and an incidentally noted thoracic aortic aneurysm.I last saw him in the office  03/03/2020.Riley KitchenHe basically has no cardiac risk factors. He's never had a heart attack or stroke. He denies chest pain or shortness of breath. A Myoview stress test was performed 10/20/15 which was entirely normal. A CT scan revealed a thoracic aortic aneurysm measuring 4.1 cm.  Hewas admitted in early December for altered mental status. He did have a troponin of 2100 and EKG changes. 2D echo revealed EF of 30% which is new compared to the EF on Myoview stress test performed 10/20/2015 which was normal. He was not a candidate for cardiac catheterization because of severe renal insufficiency nor he is a candidate for coronary CTA because of this as well. He gets occasional although infrequent chest pain. 2D echo did not comment on thoracic aortic aneurysm.  Since I saw him a month ago he is noticed increasing dyspnea on exertion and orthopnea. He denies chest pain. He was scheduled for coronary CTA but this will be canceled because of his severe renal insufficiency.  He underwent TURP by Dr. Jeffie Pollock 10/26/2019 and since that time his renal function has normalized.  He denies chest pain or shortness of breath.  His echo performed 07/14/2019 did not mention dilatation of his ascending thoracic aorta, although recent echo performed 03/20/2020 suggested his ascending thoracic aorta measured 41 mm.  He was hospitalized in late December with abdominal pain and  ultimately underwent cholecystectomy.  He is felt well since that time.  He denies chest pain or shortness of breath.     No outpatient medications have been marked as taking for the 09/12/20 encounter (Office Visit) with Lorretta Harp, MD.     No Known Allergies  Social History   Socioeconomic History  . Marital status: Married    Spouse name: Not on file  . Number of children: 3  . Years of education: 73  . Highest education level: Not on file  Occupational History  . Occupation: retired  Tobacco Use  . Smoking status: Former Smoker    Packs/day: 1.00    Years: 20.00    Pack years: 20.00    Quit date: 1976    Years since quitting: 46.1  . Smokeless tobacco: Former Systems developer    Quit date: 12/12/1979  . Tobacco comment: quit 45 years ago  Vaping Use  . Vaping Use: Never used  Substance and Sexual Activity  . Alcohol use: No  . Drug use: No  . Sexual activity: Not on file  Other Topics Concern  . Not on file  Social History Narrative   Lives with wife   Caffeine use: Drinks 6 cups coffee per day   Right handed    Social Determinants of Health   Financial Resource Strain: Not on file  Food Insecurity: Not on file  Transportation Needs: Not on file  Physical Activity: Not on file  Stress: Not on file  Social Connections: Not on file  Intimate Partner Violence: Not on file     Review of  Systems: General: negative for chills, fever, night sweats or weight changes.  Cardiovascular: negative for chest pain, dyspnea on exertion, edema, orthopnea, palpitations, paroxysmal nocturnal dyspnea or shortness of breath Dermatological: negative for rash Respiratory: negative for cough or wheezing Urologic: negative for hematuria Abdominal: negative for nausea, vomiting, diarrhea, bright red blood per rectum, melena, or hematemesis Neurologic: negative for visual changes, syncope, or dizziness All other systems reviewed and are otherwise negative except as noted  above.    Blood pressure 118/82, pulse 60, height 5\' 7"  (1.702 m), weight 178 lb 9.6 oz (81 kg).  General appearance: alert and no distress Neck: no adenopathy, no carotid bruit, no JVD, supple, symmetrical, trachea midline and thyroid not enlarged, symmetric, no tenderness/mass/nodules Lungs: clear to auscultation bilaterally Heart: regular rate and rhythm, S1, S2 normal, no murmur, click, rub or gallop Extremities: extremities normal, atraumatic, no cyanosis or edema Pulses: 2+ and symmetric Skin: Skin color, texture, turgor normal. No rashes or lesions Neurologic: Alert and oriented X 3, normal strength and tone. Normal symmetric reflexes. Normal coordination and gait  EKG not performed today  ASSESSMENT AND PLAN:   Chronic systolic CHF (congestive heart failure) ([place]) Recent 2D echo revealed EF of 45% performed 03/20/2020.  Patient denies symptoms of heart failure.  He is on carvedilol but has not been put on ACE or ARB because of prior renal insufficiency.  Thoracic aortic aneurysm ([place]) Recent 2D echo performed 03/20/2020 revealed a mild dilatation of the ascending thoracic aorta measuring 41 mm.  Hyperlipidemia History of hyperlipidemia on statin therapy with lipid profile performed 09/03/2019 revealing total cholesterol 103, LDL 32 and HDL 58.      Lorretta Harp MD FACP,FACC,FAHA, [facility] 09/12/2020 11:56 AM

## 2020-09-12 NOTE — Assessment & Plan Note (Signed)
Recent 2D echo revealed EF of 45% performed 03/20/2020.  Patient denies symptoms of heart failure.  He is on carvedilol but has not been put on ACE or ARB because of prior renal insufficiency.

## 2020-10-11 ENCOUNTER — Ambulatory Visit (HOSPITAL_BASED_OUTPATIENT_CLINIC_OR_DEPARTMENT_OTHER)
Admission: RE | Admit: 2020-10-11 | Discharge: 2020-10-11 | Disposition: A | Discharge status: Home / Self Care | Payer: Medicare PPO | Source: Ambulatory Visit | Attending: Hematology & Oncology | Admitting: Hematology & Oncology

## 2020-10-11 ENCOUNTER — Inpatient Hospital Stay: Payer: Medicare PPO | Attending: Hematology & Oncology

## 2020-10-11 ENCOUNTER — Telehealth: Payer: Self-pay

## 2020-10-11 ENCOUNTER — Other Ambulatory Visit: Payer: Self-pay

## 2020-10-11 ENCOUNTER — Encounter: Payer: Self-pay | Admitting: Hematology & Oncology

## 2020-10-11 ENCOUNTER — Inpatient Hospital Stay (HOSPITAL_BASED_OUTPATIENT_CLINIC_OR_DEPARTMENT_OTHER): Payer: Medicare PPO | Admitting: Hematology & Oncology

## 2020-10-11 VITALS — BP 125/76 | HR 57 | Temp 98.2°F | Resp 18 | Ht 67.0 in | Wt 175.8 lb

## 2020-10-11 DIAGNOSIS — Z7982 Long term (current) use of aspirin: Secondary | ICD-10-CM | POA: Diagnosis not present

## 2020-10-11 DIAGNOSIS — D472 Monoclonal gammopathy: Secondary | ICD-10-CM | POA: Diagnosis present

## 2020-10-11 DIAGNOSIS — R779 Abnormality of plasma protein, unspecified: Secondary | ICD-10-CM | POA: Insufficient documentation

## 2020-10-11 DIAGNOSIS — Q999 Chromosomal abnormality, unspecified: Secondary | ICD-10-CM | POA: Diagnosis not present

## 2020-10-11 DIAGNOSIS — Z79899 Other long term (current) drug therapy: Secondary | ICD-10-CM | POA: Diagnosis not present

## 2020-10-11 LAB — CBC WITH DIFFERENTIAL (CANCER CENTER ONLY)
Abs Immature Granulocytes: 0.03 10*3/uL (ref 0.00–0.07)
Basophils Absolute: 0 10*3/uL (ref 0.0–0.1)
Basophils Relative: 1 %
Eosinophils Absolute: 0.2 10*3/uL (ref 0.0–0.5)
Eosinophils Relative: 4 %
HCT: 34.7 % — ABNORMAL LOW (ref 39.0–52.0)
Hemoglobin: 12.4 g/dL — ABNORMAL LOW (ref 13.0–17.0)
Immature Granulocytes: 1 %
Lymphocytes Relative: 21 %
Lymphs Abs: 1.1 10*3/uL (ref 0.7–4.0)
MCH: 33.1 pg (ref 26.0–34.0)
MCHC: 35.7 g/dL (ref 30.0–36.0)
MCV: 92.5 fL (ref 80.0–100.0)
Monocytes Absolute: 0.3 10*3/uL (ref 0.1–1.0)
Monocytes Relative: 6 %
Neutro Abs: 3.5 10*3/uL (ref 1.7–7.7)
Neutrophils Relative %: 67 %
Platelet Count: 119 10*3/uL — ABNORMAL LOW (ref 150–400)
RBC: 3.75 MIL/uL — ABNORMAL LOW (ref 4.22–5.81)
RDW: 13 % (ref 11.5–15.5)
WBC Count: 5.1 10*3/uL (ref 4.0–10.5)
nRBC: 0 % (ref 0.0–0.2)

## 2020-10-11 LAB — CMP (CANCER CENTER ONLY)
ALT: 11 U/L (ref 0–44)
AST: 19 U/L (ref 15–41)
Albumin: 4.4 g/dL (ref 3.5–5.0)
Alkaline Phosphatase: 82 U/L (ref 38–126)
Anion gap: 8 (ref 5–15)
BUN: 23 mg/dL (ref 8–23)
CO2: 29 mmol/L (ref 22–32)
Calcium: 9.5 mg/dL (ref 8.9–10.3)
Chloride: 105 mmol/L (ref 98–111)
Creatinine: 1.1 mg/dL (ref 0.61–1.24)
GFR, Estimated: >60 mL/min (ref >60)
Glucose, Bld: 106 mg/dL — ABNORMAL HIGH (ref 70–99)
Potassium: 4.1 mmol/L (ref 3.5–5.1)
Sodium: 142 mmol/L (ref 135–145)
Total Bilirubin: 0.6 mg/dL (ref 0.3–1.2)
Total Protein: 6.7 g/dL (ref 6.5–8.1)

## 2020-10-11 LAB — LACTATE DEHYDROGENASE: LDH: 231 U/L — ABNORMAL HIGH (ref 98–192)

## 2020-10-11 IMAGING — CR DG BONE SURVEY MET
8 of 10 series · 8 of 10 positions shown · non-contrast
Comparison: [DATE].  Abdomen and pelvis CT dated [DATE].

CLINICAL DATA: Monoclonal gammopathy.  Follow-up bone lesions.

EXAM:
METASTATIC BONE SURVEY

[w chest pa]
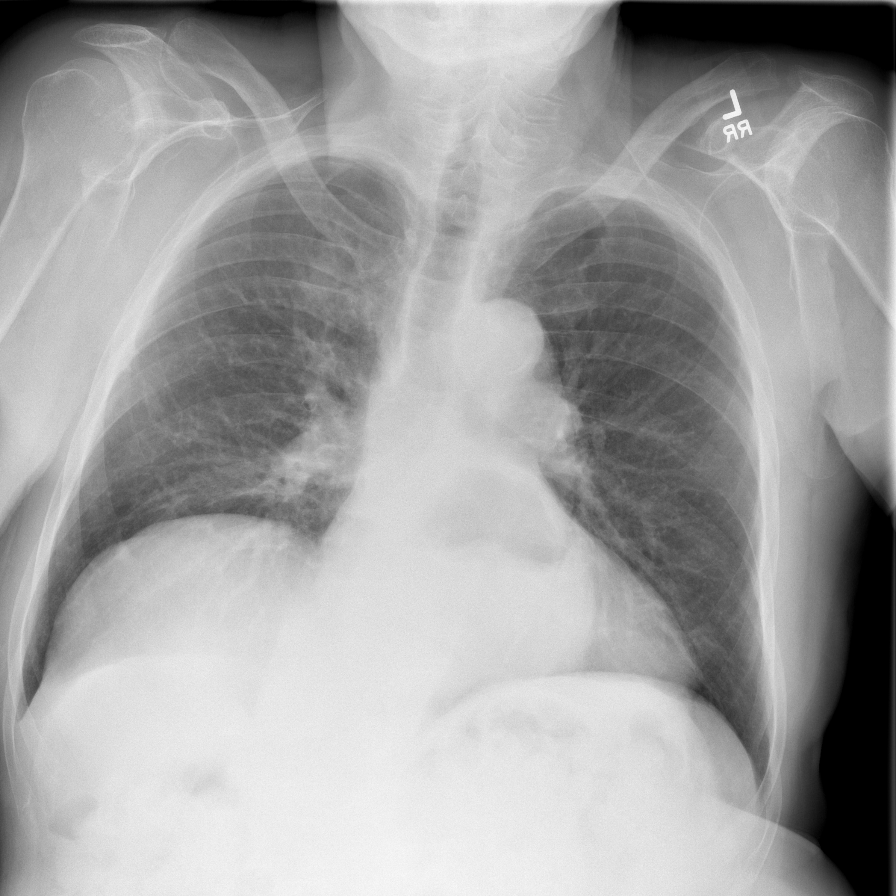

[w c-spine lat]
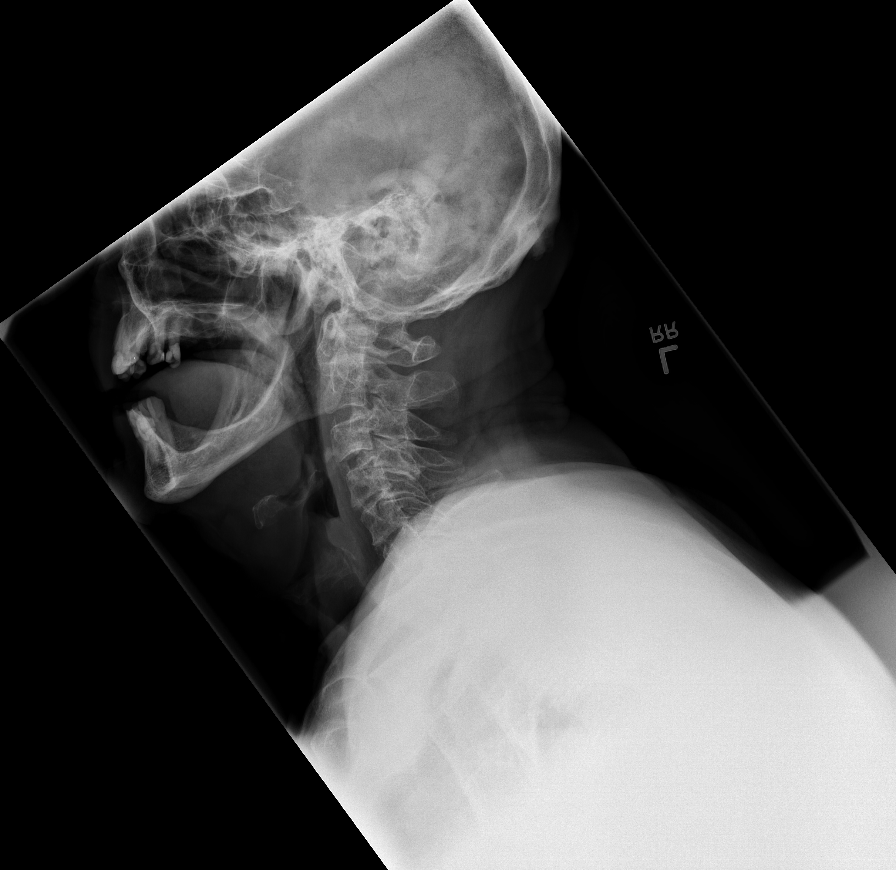

[w skull lat]
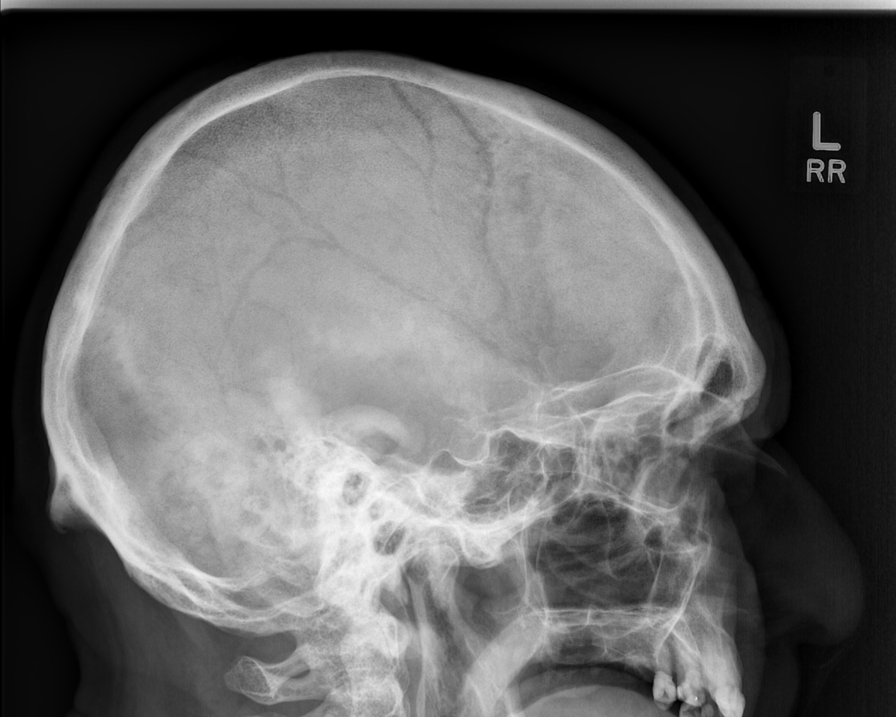

[w c-spine a.p.]
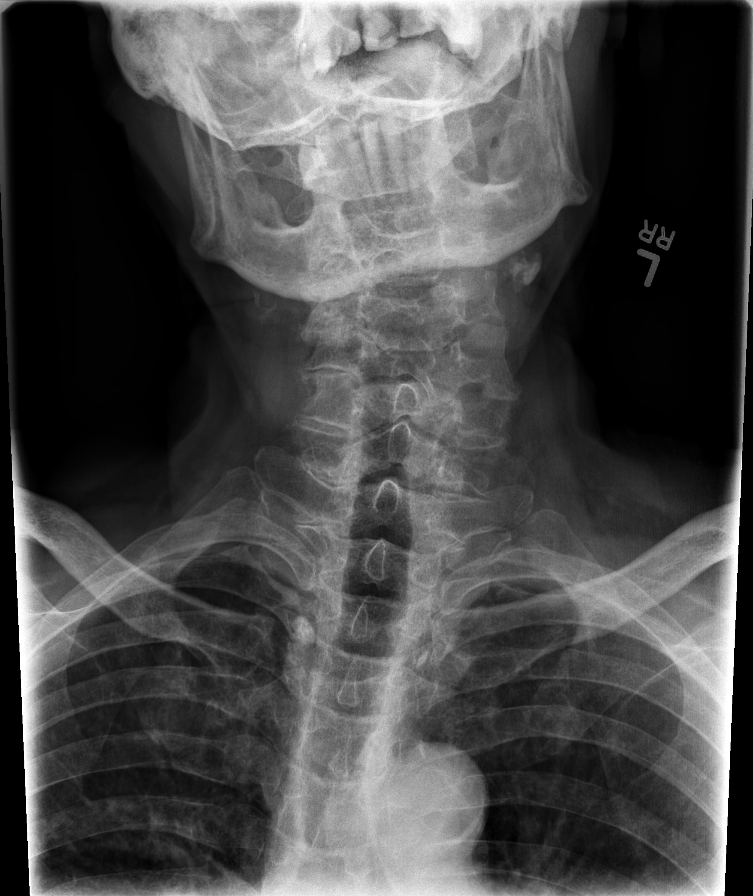

[w t-spine a.p. *]
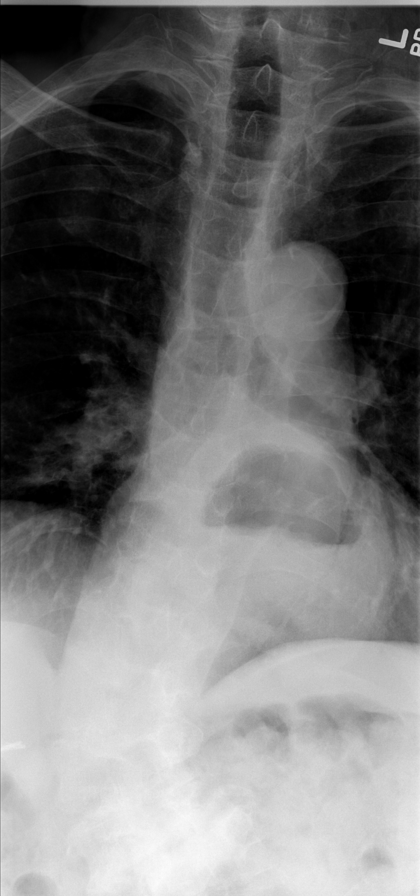

[w shoulder ap external left]
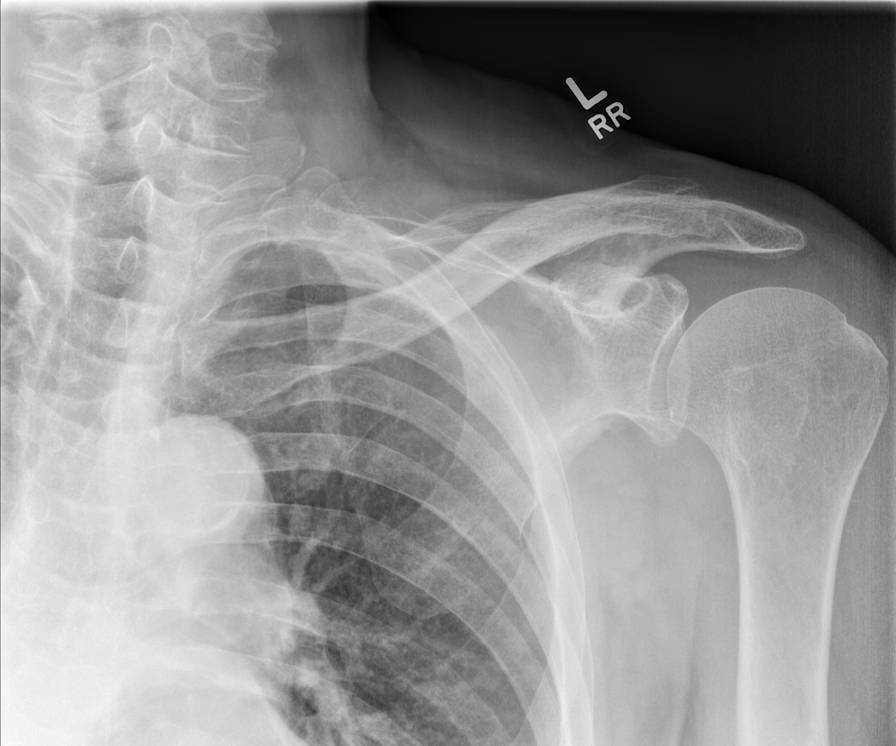

[w humerus ap left *]
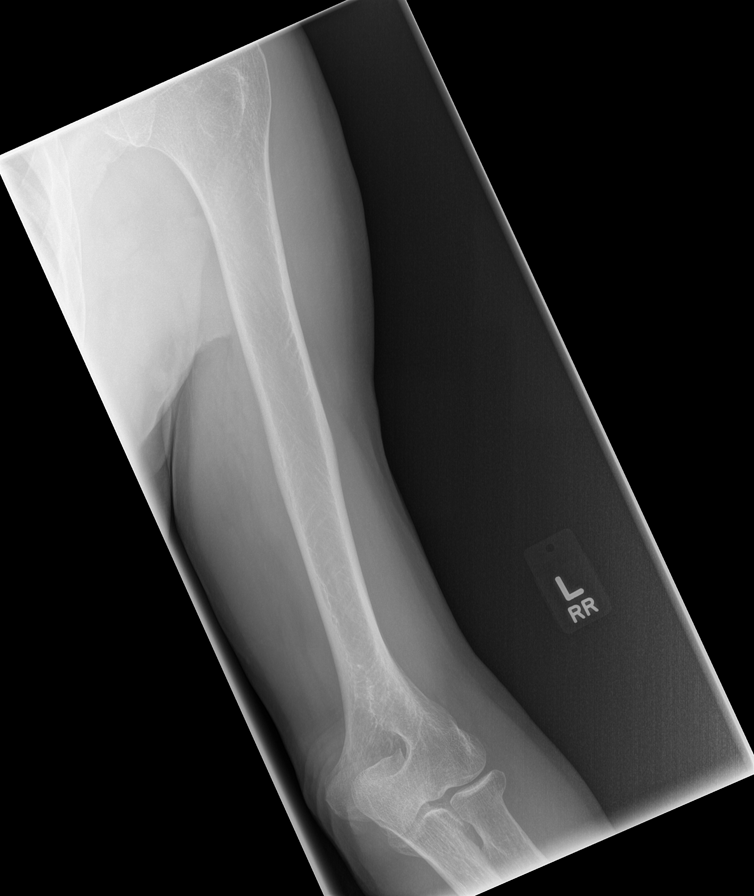

[w shoulder ap external righ]
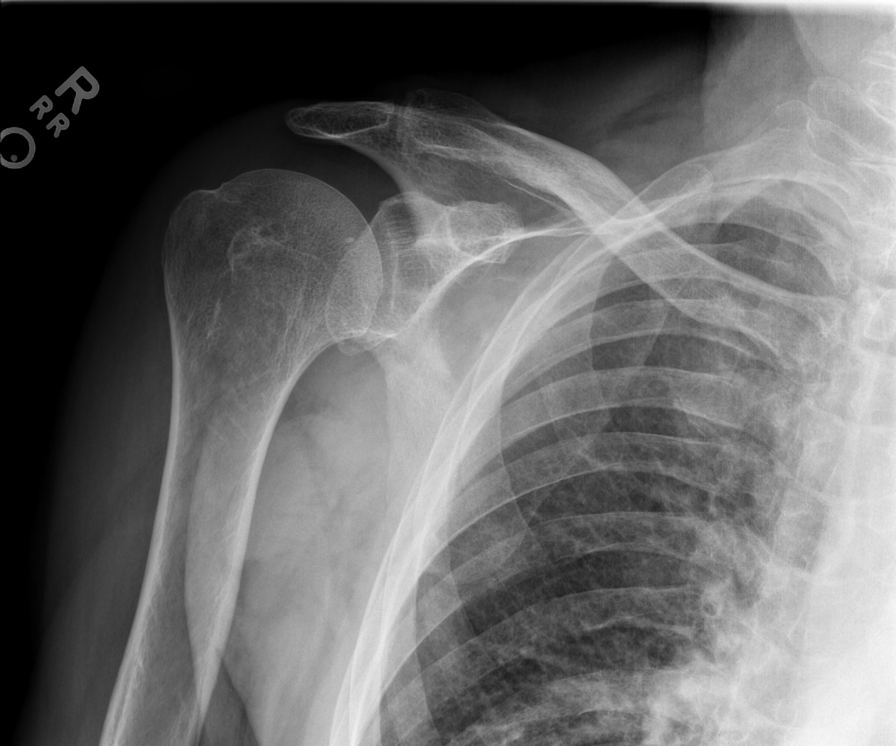

[8 of 10 positions shown; findings below may reference images not displayed]

FINDINGS: Skull:  No lytic lesions.

Cervical spine: Degenerative changes. No lytic lesions. Bilateral
carotid artery calcifications, greater on the left.

Thoracic spine:  Mild scoliosis.  No lytic lesions.

Lumbar spine: Scoliosis and extensive degenerative changes, stable.
No lytic lesions.

Chest: Interval small nodular densities in the medial right upper
lobe in lateral mid right lower lung zone as well as in the left
lower lung zone adjacent to the heart. Stable large hiatal hernia.
No lytic lesions.

Bilateral shoulders: Interval 2 small oval lucencies in the region
of the greater tuberosity of the humerus on the left. Interval
similar changes involving the right humeral head. Mild bilateral
inferior glenohumeral spur formation.

Bilateral humeri: Bilateral proximal lucencies as described above.
No other lytic lesions.

Bilateral forearms:  No lytic lesions.

Pelvis: Stable probable synovial pit in the medial right femoral
neck. No new lytic lesions.

Bilateral hips: Stable probable small synovial pit in the medial
right femoral neck with no new lytic lesions on either side.

Bilateral femurs:  No interval lytic lesions.

Bilateral lower legs:  No lytic lesions.
IMPRESSION: 1. Interval small bilateral lung nodular densities. Further
evaluation with a chest CT with contrast is recommended.
2. Interval 2 small oval lucencies in the region of the greater
tuberosity of the left humerus. These could represent small myeloma
lesions.
3. Similar interval 2 small oval lucencies in the right humeral
head. These could also represent small myeloma lesions or these
could represent small cysts.
4. Stable large hiatal hernia.

These results will be called to the ordering clinician or
representative by the Radiologist Assistant, and communication
documented in the PACS or [REDACTED].

## 2020-10-11 NOTE — Progress Notes (Signed)
Hematology and Oncology Follow Up Visit  Riley Eaton [id number] 1940/11/30 80 y.o. 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  Since we last saw him, which was in early December, he underwent a cholecystectomy.  This was right after Christmas.  He had gall stones.  Thankfully, the surgery went well.  He had no problems with healing.  Otherwise, he is doing pretty well.  He has had no specific complaints.  We last saw him in December, his monoclonal studies showed an M spike of 0.4 g/dL.  His IgG level was 1300 mg/dL.  The Kappa light chain was 6 3.7 mg/dL.  He has had no change in bowel or bladder habits.  He has had no leg swelling.  He has had no nausea or vomiting.  He had no cough.  Again we do know that he does have the abnormal chromosomes.  As such, we are watching this closely.  Currently, his performance status is ECOG 2.    Medications:  Current Outpatient Medications:  .  acetaminophen (TYLENOL) 325 MG tablet, Take 2 tablets (650 mg total) by mouth every 6 (six) hours as needed for mild pain (or temp > 100)., Disp: 20 tablet, Rfl: 0 .  allopurinol (ZYLOPRIM) 100 MG tablet, TAKE 1 TABLET BY MOUTH EVERY DAY (Patient taking differently: Take 100 mg by mouth daily.), Disp: 90 tablet, Rfl: 1 .  aspirin 81 MG chewable tablet, Chew 1 tablet (81 mg total) by mouth daily., Disp: 30 tablet, Rfl: 0 .  atorvastatin (LIPITOR) 40 MG tablet, Take 1 tablet (40 mg total) by mouth daily at 6 PM. (Patient taking differently: Take 40 mg by mouth at bedtime.), Disp: 30 tablet, Rfl: 0 .  carvedilol (COREG) 6.25 MG tablet, Take 1 tablet by mouth 2 (two) times daily., Disp: , Rfl:  .  diphenhydramine-acetaminophen (TYLENOL PM) 25-500 MG TABS tablet, Take 1 tablet by mouth at bedtime., Disp: , Rfl:  .  ferrous sulfate 325 (65 FE) MG EC tablet, Take 325  mg by mouth daily., Disp: , Rfl:  .  finasteride (PROSCAR) 5 MG tablet, Take 1 tablet (5 mg total) by mouth daily. For urination, Disp: 90 tablet, Rfl: 3 .  furosemide (LASIX) 40 MG tablet, Take 1 tablet (40 mg total) by mouth daily., Disp: 90 tablet, Rfl: 3 .  HYDROcodone-acetaminophen (NORCO/VICODIN) 5-325 MG tablet, Take 1 tablet by mouth 4 (four) times daily., Disp: , Rfl:  .  losartan (COZAAR) 25 MG tablet, Take 25 mg by mouth daily., Disp: , Rfl:  .  OLANZapine (ZYPREXA) 5 MG tablet, Take 1 tablet (5 mg total) by mouth daily., Disp: 30 tablet, Rfl: 0 .  ondansetron (ZOFRAN ODT) 4 MG disintegrating tablet, Take 1 tablet (4 mg total) by mouth every 8 (eight) hours as needed for nausea or vomiting. (Patient not taking: Reported on 09/12/2020), Disp: 20 tablet, Rfl: 0 .  pantoprazole (PROTONIX) 40 MG tablet, Take 1 tablet (40 mg total) by mouth at bedtime. (Patient taking differently: Take 40 mg by mouth daily.), Disp: 30 tablet, Rfl: 0 .  polyethylene glycol (MIRALAX / GLYCOLAX) 17 g packet, Take 17 g by mouth daily. (Patient taking differently: Take 17 g by mouth daily as needed (constipation).), Disp: 14 each, Rfl: 0 .  pregabalin (LYRICA) 150 MG capsule, Take 150 mg by mouth 2 (  two) times daily., Disp: , Rfl:  .  traZODone (DESYREL) 50 MG tablet, Take 50 mg by mouth at bedtime., Disp: , Rfl:  .  vitamin B-12 (CYANOCOBALAMIN) 500 MCG tablet, Take 500 mcg by mouth daily., Disp: , Rfl:   Allergies: No Known Allergies  Past Medical History, Surgical history, Social history, and Family History were reviewed and updated.  Review of Systems: Review of Systems  Constitutional: Negative.   HENT:  Negative.   Eyes: Negative.   Respiratory: Negative.   Cardiovascular: Negative.   Gastrointestinal: Negative.   Endocrine: Negative.   Genitourinary: Negative.    Musculoskeletal: Negative.   Skin: Negative.   Neurological: Negative.   Hematological: Negative.   Psychiatric/Behavioral: Negative.      Physical Exam:  vitals were not taken for this visit.   Wt Readings from Last 3 Encounters:  09/12/20 178 lb 9.6 oz (81 kg)  08/06/20 187 lb 6.3 oz (85 kg)  08/03/20 172 lb 14.4 oz (78.4 kg)    Physical Exam Vitals reviewed.  HENT:     Head: Normocephalic and atraumatic.  Eyes:     Pupils: Pupils are equal, round, and reactive to light.  Cardiovascular:     Rate and Rhythm: Normal rate and regular rhythm.     Heart sounds: Normal heart sounds.  Pulmonary:     Effort: Pulmonary effort is normal.     Breath sounds: Normal breath sounds.  Abdominal:     General: Bowel sounds are normal.     Palpations: Abdomen is soft.  Musculoskeletal:        General: No tenderness or deformity. Normal range of motion.     Cervical back: Normal range of motion.  Lymphadenopathy:     Cervical: No cervical adenopathy.  Skin:    General: Skin is warm and dry.     Findings: No erythema or rash.  Neurological:     Mental Status: He is alert and oriented to person, place, and time.  Psychiatric:        Behavior: Behavior normal.        Thought Content: Thought content normal.        Judgment: Judgment normal.    Lab Results  Component Value Date   WBC 5.1 10/11/2020   HGB 12.4 (L) 10/11/2020   HCT 34.7 (L) 10/11/2020   MCV 92.5 10/11/2020   PLT 119 (L) 10/11/2020     Chemistry      Component Value Date/Time   NA 137 08/08/2020 0119   NA 142 10/15/2019 1500   K 3.9 08/08/2020 0119   CL 107 08/08/2020 0119   CO2 24 08/08/2020 0119   BUN 15 08/08/2020 0119   BUN 25 10/15/2019 1500   CREATININE 1.11 08/08/2020 0119   CREATININE 1.32 (H) 07/13/2020 1500      Component Value Date/Time   CALCIUM 8.5 (L) 08/08/2020 0119   ALKPHOS 70 08/08/2020 0119   AST 36 08/08/2020 0119   AST 18 07/13/2020 1500   ALT 23 08/08/2020 0119   ALT 10 07/13/2020 1500   BILITOT 0.9 08/08/2020 0119   BILITOT 0.7 07/13/2020 1500      Impression and Plan: Riley Eaton is a very nice 80 year old  white male.  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  He does have an IgG kappa protein.  Again this might be MGUS or smoldering myeloma.  He does have the abnormal chromosome so this certainly does increase his risk of at least developing myeloma.  For right now, we will just follow him along.  I really do not think we have to do any additional studies on him.  We did a fairly thorough work-up back in the fall 2021.  I will see him back in about 3 months.  I am glad that he got through his cholecystectomy without any complications.    [name], MD 3/2/20222:05 PM

## 2020-10-11 NOTE — Telephone Encounter (Signed)
appts made and printed for pt per los   Riley Eaton 

## 2020-10-12 ENCOUNTER — Telehealth: Payer: Self-pay | Admitting: *Deleted

## 2020-10-12 LAB — KAPPA/LAMBDA LIGHT CHAINS
Kappa free light chain: 502.1 mg/L — ABNORMAL HIGH (ref 3.3–19.4)
Kappa, lambda light chain ratio: 21.37 — ABNORMAL HIGH (ref 0.26–1.65)
Lambda free light chains: 23.5 mg/L (ref 5.7–26.3)

## 2020-10-12 LAB — BETA 2 MICROGLOBULIN, SERUM: Beta-2 Microglobulin: 3.1 mg/L — ABNORMAL HIGH (ref 0.6–2.4)

## 2020-10-12 LAB — IGG, IGA, IGM
IgA: 191 mg/dL (ref 61–437)
IgG (Immunoglobin G), Serum: 1183 mg/dL (ref 603–1613)
IgM (Immunoglobulin M), Srm: 65 mg/dL (ref 15–143)

## 2020-10-12 NOTE — Telephone Encounter (Signed)
Metastatic Bone Survery results handed to Dr Marin Olp per [facility] request.  No orders received.

## 2020-10-16 LAB — PROTEIN ELECTROPHORESIS, SERUM, WITH REFLEX
A/G Ratio: 1.6 (ref 0.7–1.7)
Albumin ELP: 4 g/dL (ref 2.9–4.4)
Alpha-1-Globulin: 0.2 g/dL (ref 0.0–0.4)
Alpha-2-Globulin: 0.4 g/dL (ref 0.4–1.0)
Beta Globulin: 0.8 g/dL (ref 0.7–1.3)
Gamma Globulin: 1.1 g/dL (ref 0.4–1.8)
Globulin, Total: 2.5 g/dL (ref 2.2–3.9)
M-Spike, %: 0.4 g/dL — ABNORMAL HIGH
SPEP Interpretation: 0
Total Protein ELP: 6.5 g/dL (ref 6.0–8.5)

## 2020-10-16 LAB — IMMUNOFIXATION REFLEX, SERUM
IgA: 194 mg/dL (ref 61–437)
IgG (Immunoglobin G), Serum: 1193 mg/dL (ref 603–1613)
IgM (Immunoglobulin M), Srm: 68 mg/dL (ref 15–143)

## 2021-01-12 ENCOUNTER — Encounter: Payer: Self-pay | Admitting: Hematology & Oncology

## 2021-01-12 ENCOUNTER — Inpatient Hospital Stay (HOSPITAL_BASED_OUTPATIENT_CLINIC_OR_DEPARTMENT_OTHER): Payer: Medicare PPO | Admitting: Hematology & Oncology

## 2021-01-12 ENCOUNTER — Inpatient Hospital Stay: Payer: Medicare PPO | Attending: Hematology & Oncology

## 2021-01-12 ENCOUNTER — Other Ambulatory Visit: Payer: Self-pay

## 2021-01-12 VITALS — BP 105/66 | HR 57 | Temp 97.8°F | Resp 20 | Wt 181.0 lb

## 2021-01-12 DIAGNOSIS — Q999 Chromosomal abnormality, unspecified: Secondary | ICD-10-CM | POA: Diagnosis not present

## 2021-01-12 DIAGNOSIS — R779 Abnormality of plasma protein, unspecified: Secondary | ICD-10-CM | POA: Insufficient documentation

## 2021-01-12 DIAGNOSIS — G8929 Other chronic pain: Secondary | ICD-10-CM | POA: Insufficient documentation

## 2021-01-12 DIAGNOSIS — R918 Other nonspecific abnormal finding of lung field: Secondary | ICD-10-CM

## 2021-01-12 DIAGNOSIS — M419 Scoliosis, unspecified: Secondary | ICD-10-CM | POA: Diagnosis not present

## 2021-01-12 DIAGNOSIS — D472 Monoclonal gammopathy: Secondary | ICD-10-CM | POA: Diagnosis not present

## 2021-01-12 DIAGNOSIS — M549 Dorsalgia, unspecified: Secondary | ICD-10-CM | POA: Insufficient documentation

## 2021-01-12 DIAGNOSIS — Z79899 Other long term (current) drug therapy: Secondary | ICD-10-CM | POA: Diagnosis not present

## 2021-01-12 DIAGNOSIS — Z7982 Long term (current) use of aspirin: Secondary | ICD-10-CM | POA: Diagnosis not present

## 2021-01-12 LAB — CBC WITH DIFFERENTIAL (CANCER CENTER ONLY)
Abs Immature Granulocytes: 0.05 10*3/uL (ref 0.00–0.07)
Basophils Absolute: 0 10*3/uL (ref 0.0–0.1)
Basophils Relative: 1 %
Eosinophils Absolute: 0.2 10*3/uL (ref 0.0–0.5)
Eosinophils Relative: 3 %
HCT: 34.2 % — ABNORMAL LOW (ref 39.0–52.0)
Hemoglobin: 12.5 g/dL — ABNORMAL LOW (ref 13.0–17.0)
Immature Granulocytes: 1 %
Lymphocytes Relative: 28 %
Lymphs Abs: 1.4 10*3/uL (ref 0.7–4.0)
MCH: 33.9 pg (ref 26.0–34.0)
MCHC: 36.5 g/dL — ABNORMAL HIGH (ref 30.0–36.0)
MCV: 92.7 fL (ref 80.0–100.0)
Monocytes Absolute: 0.3 10*3/uL (ref 0.1–1.0)
Monocytes Relative: 6 %
Neutro Abs: 2.9 10*3/uL (ref 1.7–7.7)
Neutrophils Relative %: 61 %
Platelet Count: 103 10*3/uL — ABNORMAL LOW (ref 150–400)
RBC: 3.69 MIL/uL — ABNORMAL LOW (ref 4.22–5.81)
RDW: 12.6 % (ref 11.5–15.5)
WBC Count: 4.8 10*3/uL (ref 4.0–10.5)
nRBC: 0 % (ref 0.0–0.2)

## 2021-01-12 LAB — CMP (CANCER CENTER ONLY)
ALT: 12 U/L (ref 0–44)
AST: 20 U/L (ref 15–41)
Albumin: 4.2 g/dL (ref 3.5–5.0)
Alkaline Phosphatase: 86 U/L (ref 38–126)
Anion gap: 7 (ref 5–15)
BUN: 28 mg/dL — ABNORMAL HIGH (ref 8–23)
CO2: 29 mmol/L (ref 22–32)
Calcium: 9.8 mg/dL (ref 8.9–10.3)
Chloride: 102 mmol/L (ref 98–111)
Creatinine: 1.38 mg/dL — ABNORMAL HIGH (ref 0.61–1.24)
GFR, Estimated: 51 mL/min — ABNORMAL LOW (ref >60)
Glucose, Bld: 121 mg/dL — ABNORMAL HIGH (ref 70–99)
Potassium: 4.3 mmol/L (ref 3.5–5.1)
Sodium: 138 mmol/L (ref 135–145)
Total Bilirubin: 0.7 mg/dL (ref 0.3–1.2)
Total Protein: 6.5 g/dL (ref 6.5–8.1)

## 2021-01-12 LAB — LACTATE DEHYDROGENASE: LDH: 236 U/L — ABNORMAL HIGH (ref 98–192)

## 2021-01-12 NOTE — Progress Notes (Signed)
Hematology and Oncology Follow Up Visit  Riley Eaton [id number] [birth year] 80 y.o. 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  So far, he is doing pretty well.  He did have a bone survey in March.  This showed some small oval lucencies in the left humerus.  It is hard to know if this is truly myeloma.  However, was also noted were some densities in the lung.  I think were going to have to set him up with a CT scan to see what is going on with the lung densities.  When we last saw him, his myeloma studies showed a M spike of 0.4 g/dL.  His IgG level was 1185 mg/dL.  The Kappa light chain was since we last saw him, which was in early December, he underwent a cholecystectomy.  This was right after Christmas.  He had gall stones.  Thankfully, the surgery went well.  He had no problems with healing.  Otherwise, he is doing pretty well.  He has had no specific complaints.  We last saw him in December, his monoclonal studies showed an M spike of 0.4 g/dL.  His IgG level was 1300 mg/dL.  The Kappa light chain was 50.2 mg/dL.   There is been no problems with nausea or vomiting.  He has had no change in bowel or bladder habits.  He has had no rashes.  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  Overall, his performance status is ECOG 2.  Medications:  Current Outpatient Medications:  .  acetaminophen (TYLENOL) 325 MG tablet, Take 2 tablets (650 mg total) by mouth every 6 (six) hours as needed for mild pain (or temp > 100)., Disp: 20 tablet, Rfl: 0 .  allopurinol (ZYLOPRIM) 100 MG tablet, TAKE 1 TABLET BY MOUTH EVERY DAY (Patient taking differently: Take 100 mg by mouth daily.), Disp: 90 tablet, Rfl: 1 .  aspirin 81 MG chewable tablet, Chew 1 tablet (81 mg total) by mouth daily., Disp: 30 tablet, Rfl: 0 .  atorvastatin (LIPITOR) 40 MG tablet, Take 1 tablet  (40 mg total) by mouth daily at 6 PM. (Patient taking differently: Take 40 mg by mouth at bedtime.), Disp: 30 tablet, Rfl: 0 .  carvedilol (COREG) 6.25 MG tablet, Take 1 tablet by mouth 2 (two) times daily., Disp: , Rfl:  .  diphenhydramine-acetaminophen (TYLENOL PM) 25-500 MG TABS tablet, Take 1 tablet by mouth at bedtime., Disp: , Rfl:  .  ferrous sulfate 325 (65 FE) MG EC tablet, Take 325 mg by mouth daily., Disp: , Rfl:  .  finasteride (PROSCAR) 5 MG tablet, Take 1 tablet (5 mg total) by mouth daily. For urination, Disp: 90 tablet, Rfl: 3 .  furosemide (LASIX) 40 MG tablet, Take by mouth., Disp: , Rfl:  .  gabapentin (NEURONTIN) 300 MG capsule, TAKE 2 CAPSULES BY MOUTH THREE TIMES A DAY, Disp: , Rfl:  .  HYDROcodone-acetaminophen (NORCO/VICODIN) 5-325 MG tablet, Take 1 tablet by mouth 4 (four) times daily., Disp: , Rfl:  .  losartan (COZAAR) 25 MG tablet, Take 25 mg by mouth daily., Disp: , Rfl:  .  OLANZapine (ZYPREXA) 5 MG tablet, Take 1 tablet (5 mg total) by mouth daily., Disp: 30 tablet, Rfl: 0 .  pantoprazole (PROTONIX) 40 MG tablet, Take 1 tablet (40 mg total) by mouth at bedtime. (Patient taking  differently: Take 40 mg by mouth daily.), Disp: 30 tablet, Rfl: 0 .  polyethylene glycol (MIRALAX / GLYCOLAX) 17 g packet, Take 17 g by mouth daily. (Patient taking differently: Take 17 g by mouth daily as needed (constipation).), Disp: 14 each, Rfl: 0 .  pregabalin (LYRICA) 150 MG capsule, Take 150 mg by mouth 2 (two) times daily., Disp: , Rfl:  .  traZODone (DESYREL) 50 MG tablet, Take 50 mg by mouth at bedtime., Disp: , Rfl:  .  vitamin B-12 (CYANOCOBALAMIN) 500 MCG tablet, Take 500 mcg by mouth daily., Disp: , Rfl:  .  colchicine 0.6 MG tablet, TAKE TWO TABLETS BY MOUTH AT ONSET AND  THEN TAKE ONE TABLET ONE HOUR LATER AS NEEDED FOR GOUT ATTACK. (MAXIMUM 3  TABLETS IN 3 DAYS) AND   THEN TAKE ONE TABLET ONE HOUR LATER AS NEEDED FOR GOUT ATTACK. (MAXIMUM 3   TABLETS IN 3 DAYS) (Patient not  taking: Reported on 01/12/2021), Disp: , Rfl:  .  ondansetron (ZOFRAN ODT) 4 MG disintegrating tablet, Take 1 tablet (4 mg total) by mouth every 8 (eight) hours as needed for nausea or vomiting. (Patient not taking: No sig reported), Disp: 20 tablet, Rfl: 0  Allergies: No Known Allergies  Past Medical History, Surgical history, Social history, and Family History were reviewed and updated.  Review of Systems: Review of Systems  Constitutional: Negative.   HENT:  Negative.   Eyes: Negative.   Respiratory: Negative.   Cardiovascular: Negative.   Gastrointestinal: Negative.   Endocrine: Negative.   Genitourinary: Negative.    Musculoskeletal: Negative.   Skin: Negative.   Neurological: Negative.   Hematological: Negative.   Psychiatric/Behavioral: Negative.     Physical Exam:  weight is 82.1 kg. His oral temperature is 97.8 F (36.6 C). His blood pressure is 105/66 and his pulse is 57 (abnormal). His respiration is 20 and oxygen saturation is 98%.   Wt Readings from Last 3 Encounters:  01/12/21 82.1 kg  10/11/20 79.7 kg  09/12/20 81 kg    Physical Exam Vitals reviewed.  HENT:     Head: Normocephalic and atraumatic.  Eyes:     Pupils: Pupils are equal, round, and reactive to light.  Cardiovascular:     Rate and Rhythm: Normal rate and regular rhythm.     Heart sounds: Normal heart sounds.  Pulmonary:     Effort: Pulmonary effort is normal.     Breath sounds: Normal breath sounds.  Abdominal:     General: Bowel sounds are normal.     Palpations: Abdomen is soft.  Musculoskeletal:        General: No tenderness or deformity. Normal range of motion.     Cervical back: Normal range of motion.  Lymphadenopathy:     Cervical: No cervical adenopathy.  Skin:    General: Skin is warm and dry.     Findings: No erythema or rash.  Neurological:     Mental Status: He is alert and oriented to person, place, and time.  Psychiatric:        Behavior: Behavior normal.         Thought Content: Thought content normal.        Judgment: Judgment normal.    Lab Results  Component Value Date   WBC 4.8 01/12/2021   HGB 12.5 (L) 01/12/2021   HCT 34.2 (L) 01/12/2021   MCV 92.7 01/12/2021   PLT 103 (L) 01/12/2021     Chemistry      Component Value  Date/Time   NA 138 01/12/2021 1318   NA 142 10/15/2019 1500   K 4.3 01/12/2021 1318   CL 102 01/12/2021 1318   CO2 29 01/12/2021 1318   BUN 28 (H) 01/12/2021 1318   BUN 25 10/15/2019 1500   CREATININE 1.38 (H) 01/12/2021 1318      Component Value Date/Time   CALCIUM 9.8 01/12/2021 1318   ALKPHOS 86 01/12/2021 1318   AST 20 01/12/2021 1318   ALT 12 01/12/2021 1318   BILITOT 0.7 01/12/2021 1318      Impression and Plan: Riley Eaton is a very nice 80 year old white male.  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  He does have an IgG kappa protein.  Again this might be MGUS or smoldering myeloma.  He does have the abnormal chromosome so this certainly does increase his risk of at least developing myeloma.  We will see what his myeloma studies look like.  We will also have to get the CT scan set up for him.  Again I am and I cannot imagine anything in the lung that would be significant with respect to myeloma.  I would like to see him back in another 3 months.    [name], MD 6/3/20222:05 PM

## 2021-01-13 LAB — IGG, IGA, IGM
IgA: 187 mg/dL (ref 61–437)
IgG (Immunoglobin G), Serum: 1099 mg/dL (ref 603–1613)
IgM (Immunoglobulin M), Srm: 59 mg/dL (ref 15–143)

## 2021-01-15 LAB — KAPPA/LAMBDA LIGHT CHAINS
Kappa free light chain: 539.5 mg/L — ABNORMAL HIGH (ref 3.3–19.4)
Kappa, lambda light chain ratio: 20.91 — ABNORMAL HIGH (ref 0.26–1.65)
Lambda free light chains: 25.8 mg/L (ref 5.7–26.3)

## 2021-01-16 LAB — PROTEIN ELECTROPHORESIS, SERUM, WITH REFLEX
A/G Ratio: 1.4 (ref 0.7–1.7)
Albumin ELP: 3.9 g/dL (ref 2.9–4.4)
Alpha-1-Globulin: 0.2 g/dL (ref 0.0–0.4)
Alpha-2-Globulin: 0.4 g/dL (ref 0.4–1.0)
Beta Globulin: 0.8 g/dL (ref 0.7–1.3)
Gamma Globulin: 1.2 g/dL (ref 0.4–1.8)
Globulin, Total: 2.7 g/dL (ref 2.2–3.9)
M-Spike, %: 0.6 g/dL — ABNORMAL HIGH
SPEP Interpretation: 0
Total Protein ELP: 6.6 g/dL (ref 6.0–8.5)

## 2021-01-16 LAB — IMMUNOFIXATION REFLEX, SERUM
IgA: 185 mg/dL (ref 61–437)
IgG (Immunoglobin G), Serum: 1228 mg/dL (ref 603–1613)
IgM (Immunoglobulin M), Srm: 59 mg/dL (ref 15–143)

## 2021-01-17 ENCOUNTER — Ambulatory Visit (HOSPITAL_BASED_OUTPATIENT_CLINIC_OR_DEPARTMENT_OTHER)
Admission: RE | Admit: 2021-01-17 | Discharge: 2021-01-17 | Disposition: A | Discharge status: Home / Self Care | Payer: Medicare PPO | Source: Ambulatory Visit | Attending: Hematology & Oncology | Admitting: Hematology & Oncology

## 2021-01-17 ENCOUNTER — Other Ambulatory Visit: Payer: Self-pay

## 2021-01-17 DIAGNOSIS — R918 Other nonspecific abnormal finding of lung field: Secondary | ICD-10-CM | POA: Insufficient documentation

## 2021-01-17 IMAGING — CT CT CHEST W/O CM
2 of 3 series · 15 of 36 positions shown, 18 images · non-contrast
Comparison: [DATE].

CLINICAL DATA: Lung nodule.

EXAM:
CT CHEST WITHOUT CONTRAST
TECHNIQUE: Multidetector CT imaging of the chest was performed following the
standard protocol without IV contrast.

[Series 2: thorax · axial · 0.79mm/px · z∈[-345,-71]mm · 12 of 161 slices shown, 15 images]
[im 12/161  mediastinal]
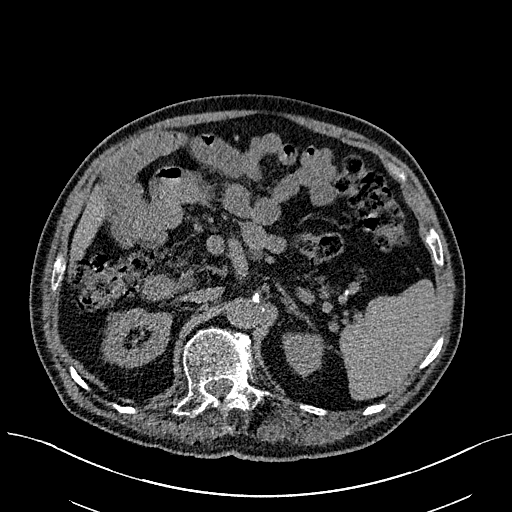
[im 12/161  lung]
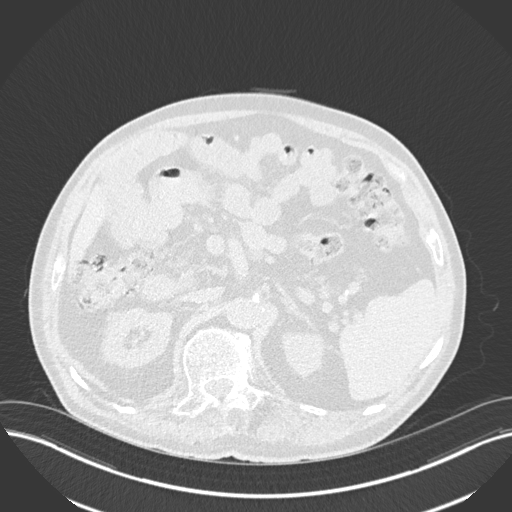
[im 24/161  lung]
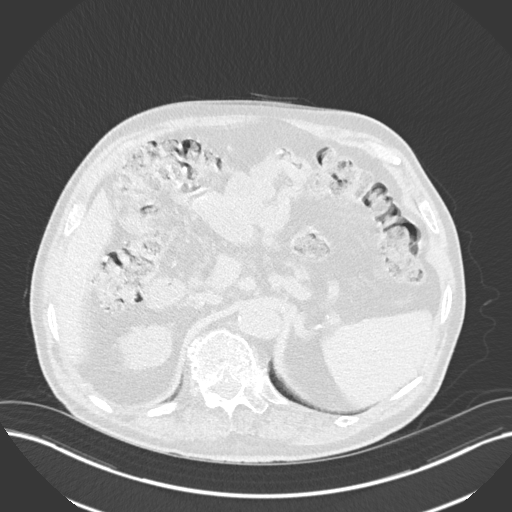
[im 36/161  lung]
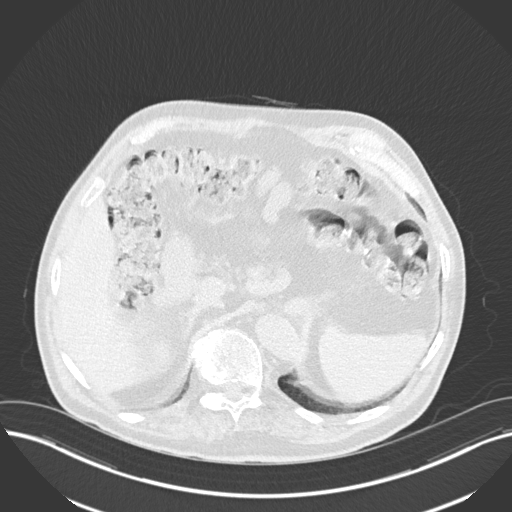
[im 48/161  lung]
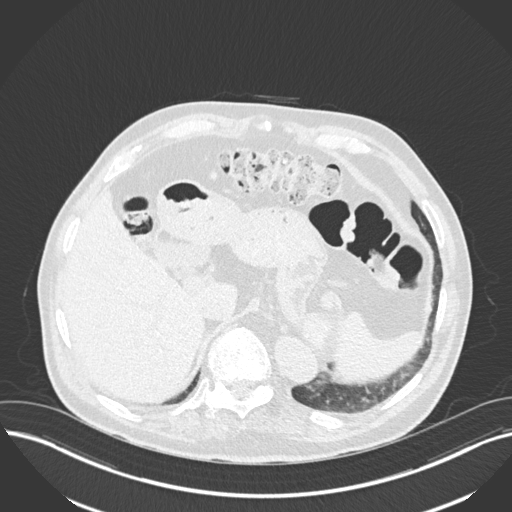
[im 60/161  mediastinal]
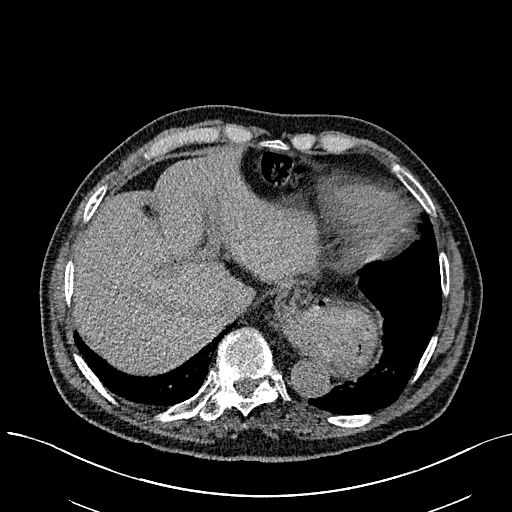
[im 60/161  lung]
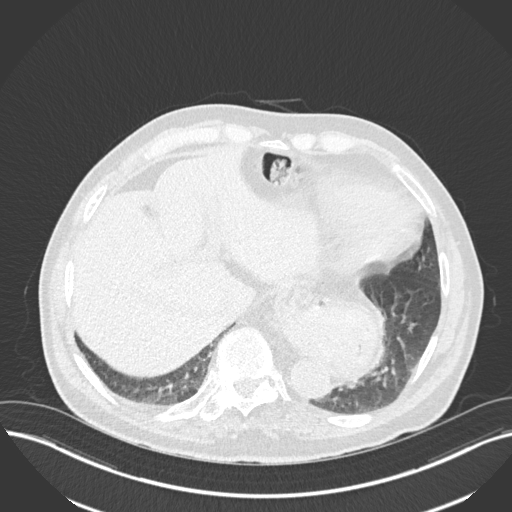
[im 72/161  lung]
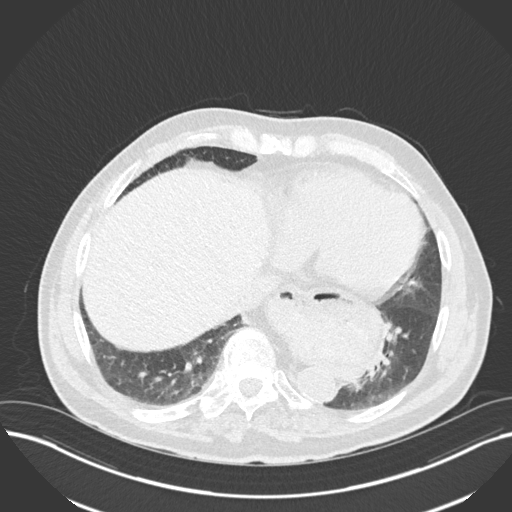
[im 89/161  lung]
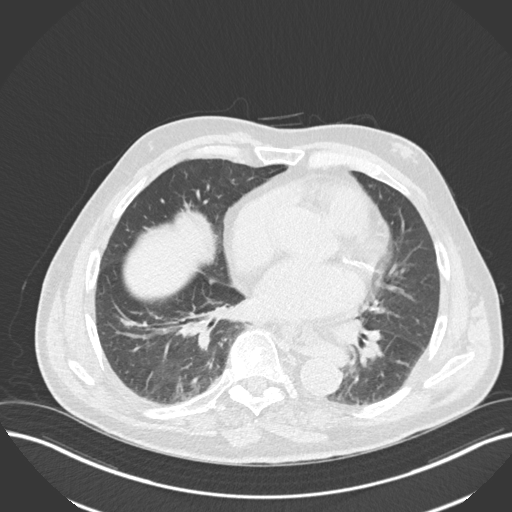
[im 101/161  lung]
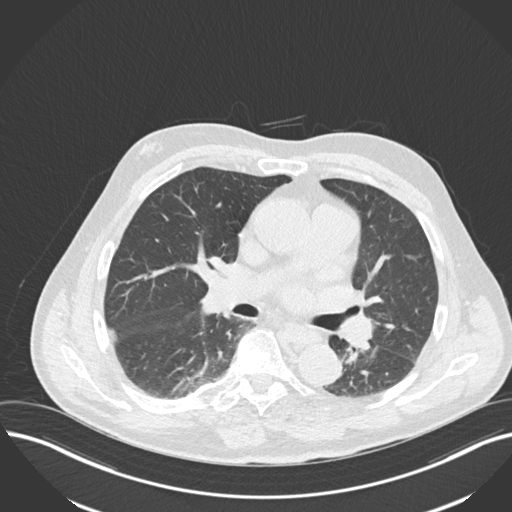
[im 113/161  mediastinal]
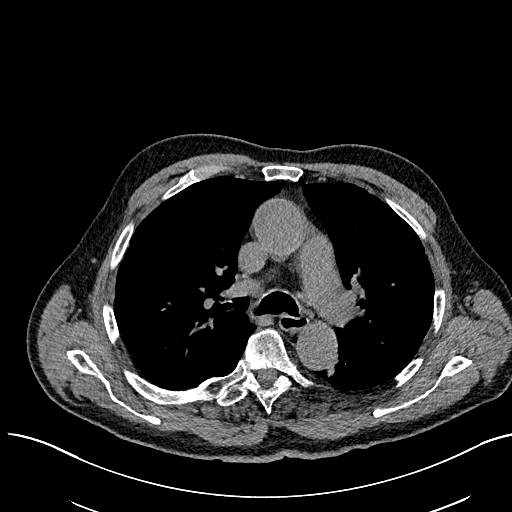
[im 113/161  lung]
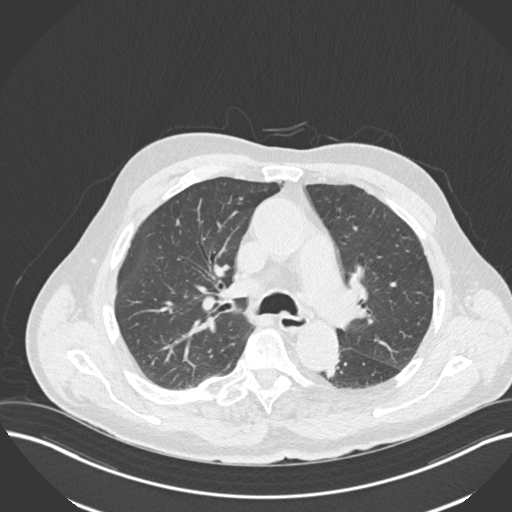
[im 125/161  lung]
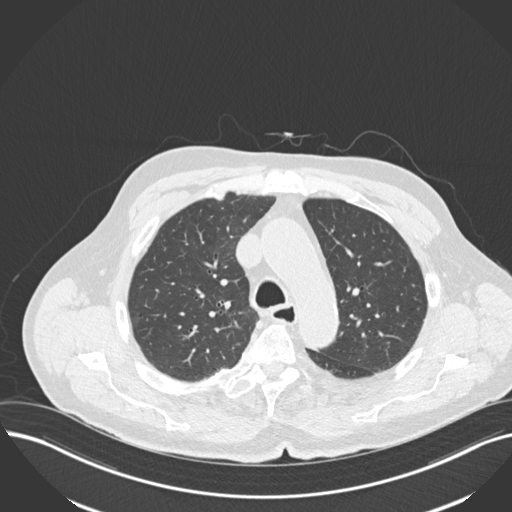
[im 137/161  lung]
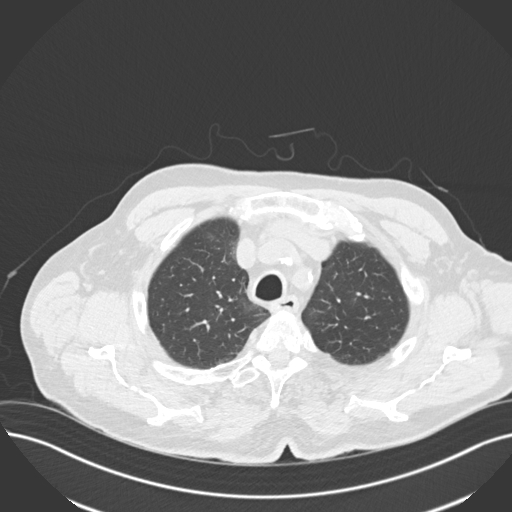
[im 149/161  lung]
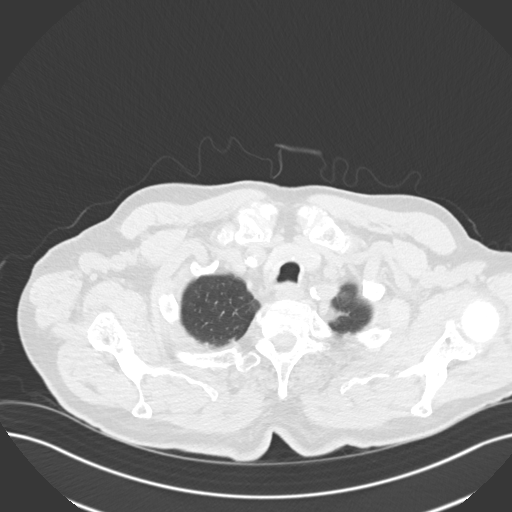

[Series 5: coronal · coronal · 0.66mm/px · 3 of 139 slices shown]
[im 28/139  lung]
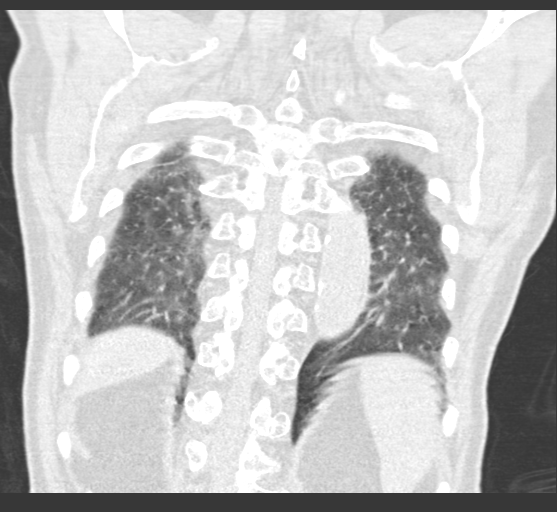
[im 56/139  lung]
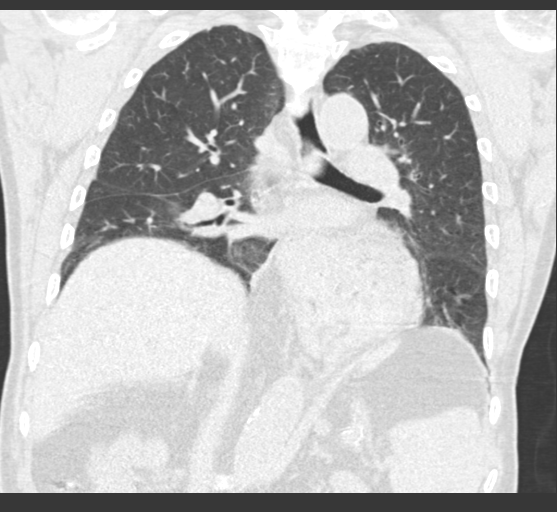
[im 83/139  lung]
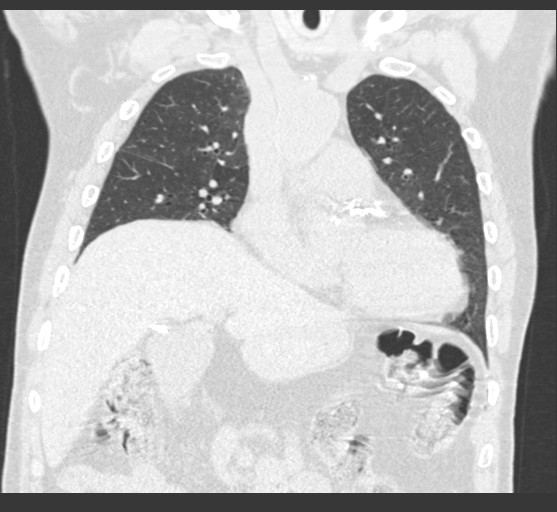

[15 of 36 positions shown; findings below may reference images not displayed]

FINDINGS: Cardiovascular: Atherosclerotic calcification of the aorta, aortic
valve and coronary arteries. Ascending aorta measures 4.2 cm,
unchanged. Heart is enlarged. No pericardial effusion.

Mediastinum/Nodes: Subcentimeter low-attenuation lesion in the left
thyroid. No follow-up recommended. (Ref: [HOSPITAL]. [DATE]): 143-50).No pathologically enlarged mediastinal or
axillary lymph nodes. Hilar regions are difficult to definitively
evaluate without IV contrast. Esophagus is grossly unremarkable.
Large hiatal hernia.

Lungs/Pleura: Areas of new clustered peribronchovascular nodularity
in the posterior segment right upper lobe, individually measuring up
to 3 mm (4/53). Image quality is degraded by respiratory motion and
expiratory phase imaging. 6 mm right upper lobe nodule (4/40),
unchanged and benign. 9 mm lingular nodule is new (4/66). Lungs are
otherwise clear. Debris is seen in the airway.

Upper Abdomen: Liver and adrenal glands are unremarkable.
Subcentimeter low-attenuation lesion off the right kidney is too
small to characterize. Visualized portions of the kidneys, spleen
and pancreas are unremarkable. Large hiatal hernia. Visualized
portions of the bowel are unremarkable. Cholecystectomy.

Musculoskeletal: Degenerative changes in the spine. No worrisome
lytic or sclerotic lesions. Bone island in the anterolateral right
sixth rib and well-circumscribed lucent lesion in the lateral right
seventh rib, unchanged from [DATE].
IMPRESSION: 1. New 9 mm lingular nodule, indeterminate. Continued attention on
follow-up exams is warranted.
2. New clustered areas of peribronchovascular nodularity in the
posterior segment right upper lobe, likely post infectious or
inflammatory in etiology. Again, continued attention on follow-up is
recommended.
3. Ascending aortic aneurysm, stable. Recommend annual imaging
followup by CTA or MRA. This recommendation follows [PC]
ACCF/AHA/AATS/ACR/ASA/SCA/ROEBROEK/ROEBROEK/ROEBROEK/ROEBROEK Guidelines for the
Diagnosis and Management of Patients with Thoracic Aortic Disease.
Circulation. [PC]; 121: [id number]. Aortic aneurysm NOS
([PC]-[PC]).
4. Large hiatal hernia.
5. Aortic atherosclerosis ([PC]-[PC]). Coronary artery
calcification.

## 2021-01-18 ENCOUNTER — Telehealth: Payer: Self-pay | Admitting: *Deleted

## 2021-01-18 NOTE — Telephone Encounter (Addendum)
-----   Message from [name], MD sent at 01/18/2021  1:25 PM EDT ----- Called patient to tell him that the CT scan of the chest shows there is a possible new spot in the left lung.  Is still very small.  We just have to follow this along. Also let hm know that the myeloma studies are still stable.  Patient appreciates the call

## 2021-02-22 ENCOUNTER — Telehealth: Payer: Self-pay

## 2021-03-20 ENCOUNTER — Encounter: Payer: Self-pay | Admitting: Cardiovascular Disease

## 2021-03-20 ENCOUNTER — Other Ambulatory Visit: Payer: Self-pay

## 2021-03-20 ENCOUNTER — Ambulatory Visit: Payer: Medicare PPO | Admitting: Cardiovascular Disease

## 2021-03-20 VITALS — BP 102/56 | HR 53 | Ht 67.0 in | Wt 184.0 lb

## 2021-03-20 DIAGNOSIS — I712 Thoracic aortic aneurysm, without rupture, unspecified: Secondary | ICD-10-CM

## 2021-03-20 DIAGNOSIS — I5022 Chronic systolic (congestive) heart failure: Secondary | ICD-10-CM | POA: Diagnosis not present

## 2021-03-20 DIAGNOSIS — I214 Non-ST elevation (NSTEMI) myocardial infarction: Secondary | ICD-10-CM | POA: Diagnosis not present

## 2021-03-20 DIAGNOSIS — I2510 Atherosclerotic heart disease of native coronary artery without angina pectoris: Secondary | ICD-10-CM

## 2021-03-20 NOTE — Patient Instructions (Signed)

## 2021-03-20 NOTE — Assessment & Plan Note (Signed)
History of mild dilatation of thoracic aorta measuring 41 mm by 2D echo 1 year ago.

## 2021-03-20 NOTE — Assessment & Plan Note (Signed)
Chest CT performed 01/18/2021 did show coronary calcification.  He had a negative Myoview 10/20/2015.  He denies chest pain.

## 2021-03-20 NOTE — Assessment & Plan Note (Signed)
History of chronic systolic heart failure with echo performed most recently 03/20/2020 revealing an ejection fraction of 40 to 45% with grade 1 diastolic dysfunction.  There is no valvular abnormality noted.  He does have mild dyspnea on exertion.  He is on carvedilol, furosemide and low-dose losartan.

## 2021-03-20 NOTE — Progress Notes (Signed)
03/20/2021 Riley Eaton   [birth year]  [id number]  Primary Physician Aletha Halim., PA-C Primary Cardiologist: Lorretta Harp MD FACP, [place], [place], [place]  HPI:  Riley Eaton is a 80 y.o.  mildly overweight married Caucasian male  father of 59, grandfather of 4 grandchildren. Retired from being in the [employer]. He is referred by Leonia Reader PA-C at [facility] for cardiovascular evaluation because of an episode of shortness of breath, and an incidentally noted thoracic aortic aneurysm. I last saw him in the office  09/12/2020.[name] He basically has no cardiac risk factors. He's never had a heart attack or stroke. He denies chest pain or shortness of breath. A Myoview stress test was performed 10/20/15 which was entirely normal. A CT scan revealed a thoracic aortic aneurysm measuring 4.1 cm.   He  was admitted in early December for altered mental status.  He did have a troponin of 2100 and EKG changes.  2D echo revealed EF of 30% which is new compared to the EF on Myoview stress test performed 10/20/2015 which was normal.  He was not a candidate for cardiac catheterization because of severe renal insufficiency nor he is a candidate for coronary CTA because of this as well.  He gets occasional although infrequent chest pain.  2D echo did not comment on thoracic aortic aneurysm.   Since I saw him a month ago he is noticed increasing dyspnea on exertion and orthopnea.  He denies chest pain.  He was scheduled for coronary CTA but this will be canceled because of his severe renal insufficiency.   He underwent TURP by Dr. Jeffie Pollock 10/26/2019 and since that time his renal function has normalized.  He denies chest pain or shortness of breath.  His echo performed 07/14/2019 did not mention dilatation of his ascending thoracic aorta, although recent echo performed 03/20/2020 suggested his ascending thoracic aorta measured 41 mm.   He was hospitalized in late December 2021 with abdominal pain and  ultimately underwent cholecystectomy.  He is felt well since that time.  He denies chest pain or shortness of breath.  Since I saw him 6 months ago he is remained stable.  He does complain of some dyspnea but denies chest pain.   Current Meds  Medication Sig   acetaminophen (TYLENOL) 325 MG tablet Take 2 tablets (650 mg total) by mouth every 6 (six) hours as needed for mild pain (or temp > 100).   allopurinol (ZYLOPRIM) 100 MG tablet TAKE 1 TABLET BY MOUTH EVERY DAY (Patient taking differently: Take 100 mg by mouth daily.)   aspirin 81 MG chewable tablet Chew 1 tablet (81 mg total) by mouth daily.   atorvastatin (LIPITOR) 40 MG tablet Take 1 tablet (40 mg total) by mouth daily at 6 PM. (Patient taking differently: Take 40 mg by mouth at bedtime.)   carvedilol (COREG) 6.25 MG tablet Take 1 tablet by mouth 2 (two) times daily.   colchicine 0.6 MG tablet    diphenhydramine-acetaminophen (TYLENOL PM) 25-500 MG TABS tablet Take 1 tablet by mouth at bedtime.   ferrous sulfate 325 (65 FE) MG EC tablet Take 325 mg by mouth daily.   finasteride (PROSCAR) 5 MG tablet Take 1 tablet (5 mg total) by mouth daily. For urination   furosemide (LASIX) 40 MG tablet Take by mouth.   gabapentin (NEURONTIN) 300 MG capsule TAKE 2 CAPSULES BY MOUTH THREE TIMES A DAY   HYDROcodone-acetaminophen (NORCO/VICODIN) 5-325 MG tablet Take 1 tablet by mouth 4 (  four) times daily.   losartan (COZAAR) 25 MG tablet Take 25 mg by mouth daily.   OLANZapine (ZYPREXA) 5 MG tablet Take 1 tablet (5 mg total) by mouth daily.   ondansetron (ZOFRAN ODT) 4 MG disintegrating tablet Take 1 tablet (4 mg total) by mouth every 8 (eight) hours as needed for nausea or vomiting.   pantoprazole (PROTONIX) 40 MG tablet Take 1 tablet (40 mg total) by mouth at bedtime. (Patient taking differently: Take 40 mg by mouth daily.)   polyethylene glycol (MIRALAX / GLYCOLAX) 17 g packet Take 17 g by mouth daily. (Patient taking differently: Take 17 g by mouth  daily as needed (constipation).)   pregabalin (LYRICA) 150 MG capsule Take 150 mg by mouth 2 (two) times daily.   traZODone (DESYREL) 50 MG tablet Take 50 mg by mouth at bedtime.   vitamin B-12 (CYANOCOBALAMIN) 500 MCG tablet Take 500 mcg by mouth daily.     No Known Allergies  Social History   Socioeconomic History   Marital status: Married    Spouse name: Not on file   Number of children: 3   Years of education: 12   Highest education level: Not on file  Occupational History   Occupation: retired  Tobacco Use   Smoking status: Former    Packs/day: 1.00    Years: 20.00    Pack years: 20.00    Types: Cigarettes    Quit date: 1976    Years since quitting: 46.6   Smokeless tobacco: Former    Quit date: 12/12/1979   Tobacco comments:    quit 45 years ago  Vaping Use   Vaping Use: Never used  Substance and Sexual Activity   Alcohol use: No   Drug use: No   Sexual activity: Not on file  Other Topics Concern   Not on file  Social History Narrative   Lives with wife   Caffeine use: Drinks 6 cups coffee per day   Right handed    Social Determinants of Health   Financial Resource Strain: Not on file  Food Insecurity: Not on file  Transportation Needs: Not on file  Physical Activity: Not on file  Stress: Not on file  Social Connections: Not on file  Intimate Partner Violence: Not on file     Review of Systems: General: negative for chills, fever, night sweats or weight changes.  Cardiovascular: negative for chest pain, dyspnea on exertion, edema, orthopnea, palpitations, paroxysmal nocturnal dyspnea or shortness of breath Dermatological: negative for rash Respiratory: negative for cough or wheezing Urologic: negative for hematuria Abdominal: negative for nausea, vomiting, diarrhea, bright red blood per rectum, melena, or hematemesis Neurologic: negative for visual changes, syncope, or dizziness All other systems reviewed and are otherwise negative except as noted  above.    Blood pressure (!) 102/56, pulse (!) 53, height '5\' 7"'$  (1.702 m), weight 184 lb (83.5 kg).  General appearance: alert and no distress Neck: no adenopathy, no carotid bruit, no JVD, supple, symmetrical, trachea midline, and thyroid not enlarged, symmetric, no tenderness/mass/nodules Lungs: clear to auscultation bilaterally Heart: regular rate and rhythm, S1, S2 normal, no murmur, click, rub or gallop Extremities: extremities normal, atraumatic, no cyanosis or edema Pulses: 2+ and symmetric Skin: Skin color, texture, turgor normal. No rashes or lesions Neurologic: Grossly normal  EKG sinus bradycardia 53 with first-degree AV block.  I personally reviewed this EKG.  ASSESSMENT AND PLAN:   Chronic systolic CHF (congestive heart failure) (HCC) History of chronic systolic heart failure with  echo performed most recently 03/20/2020 revealing an ejection fraction of 40 to 45% with grade 1 diastolic dysfunction.  There is no valvular abnormality noted.  He does have mild dyspnea on exertion.  He is on carvedilol, furosemide and low-dose losartan.  Thoracic aortic aneurysm (HCC) History of mild dilatation of thoracic aorta measuring 41 mm by 2D echo 1 year ago.  Coronary artery calcification seen on CT scan Chest CT performed 01/18/2021 did show coronary calcification.  He had a negative Myoview 10/20/2015.  He denies chest pain.  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  He was felt not to be a catheterization candidate because of renal insufficiency although Myoview at that time was nonischemic.     Lorretta Harp MD FACP,FACC,FAHA, [facility] 03/20/2021 2:01 PM

## 2021-03-20 NOTE — Assessment & Plan Note (Signed)
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  He was felt not to be a catheterization candidate because of renal insufficiency although Myoview at that time was nonischemic.

## 2021-03-28 ENCOUNTER — Telehealth: Payer: Self-pay

## 2021-04-13 ENCOUNTER — Ambulatory Visit: Payer: Medicare PPO | Admitting: Hematology & Oncology

## 2021-04-13 ENCOUNTER — Other Ambulatory Visit: Payer: Medicare PPO

## 2021-04-23 ENCOUNTER — Other Ambulatory Visit: Payer: Medicare PPO

## 2021-04-23 ENCOUNTER — Ambulatory Visit: Payer: Medicare PPO | Admitting: Hematology & Oncology

## 2021-05-07 ENCOUNTER — Inpatient Hospital Stay: Payer: Medicare PPO | Attending: Hematology & Oncology

## 2021-05-07 ENCOUNTER — Encounter: Payer: Self-pay | Admitting: Hematology & Oncology

## 2021-05-07 ENCOUNTER — Inpatient Hospital Stay: Payer: Medicare PPO | Admitting: Hematology & Oncology

## 2021-05-07 ENCOUNTER — Other Ambulatory Visit: Payer: Self-pay

## 2021-05-07 VITALS — BP 117/70 | HR 56 | Temp 98.5°F | Resp 18 | Ht 67.0 in | Wt 181.1 lb

## 2021-05-07 DIAGNOSIS — D472 Monoclonal gammopathy: Secondary | ICD-10-CM

## 2021-05-07 DIAGNOSIS — R918 Other nonspecific abnormal finding of lung field: Secondary | ICD-10-CM

## 2021-05-07 LAB — CBC WITH DIFFERENTIAL (CANCER CENTER ONLY)
Abs Immature Granulocytes: 0.03 10*3/uL (ref 0.00–0.07)
Basophils Absolute: 0 10*3/uL (ref 0.0–0.1)
Basophils Relative: 0 %
Eosinophils Absolute: 0.1 10*3/uL (ref 0.0–0.5)
Eosinophils Relative: 2 %
HCT: 31.1 % — ABNORMAL LOW (ref 39.0–52.0)
Hemoglobin: 11.2 g/dL — ABNORMAL LOW (ref 13.0–17.0)
Immature Granulocytes: 1 %
Lymphocytes Relative: 24 %
Lymphs Abs: 0.9 10*3/uL (ref 0.7–4.0)
MCH: 34 pg (ref 26.0–34.0)
MCHC: 36 g/dL (ref 30.0–36.0)
MCV: 94.5 fL (ref 80.0–100.0)
Monocytes Absolute: 0.3 10*3/uL (ref 0.1–1.0)
Monocytes Relative: 9 %
Neutro Abs: 2.4 10*3/uL (ref 1.7–7.7)
Neutrophils Relative %: 64 %
Platelet Count: 120 10*3/uL — ABNORMAL LOW (ref 150–400)
RBC: 3.29 MIL/uL — ABNORMAL LOW (ref 4.22–5.81)
RDW: 12.8 % (ref 11.5–15.5)
WBC Count: 3.8 10*3/uL — ABNORMAL LOW (ref 4.0–10.5)
nRBC: 0 % (ref 0.0–0.2)

## 2021-05-07 LAB — CMP (CANCER CENTER ONLY)
ALT: 13 U/L (ref 0–44)
AST: 21 U/L (ref 15–41)
Albumin: 4.1 g/dL (ref 3.5–5.0)
Alkaline Phosphatase: 95 U/L (ref 38–126)
Anion gap: 8 (ref 5–15)
BUN: 29 mg/dL — ABNORMAL HIGH (ref 8–23)
CO2: 28 mmol/L (ref 22–32)
Calcium: 9.2 mg/dL (ref 8.9–10.3)
Chloride: 105 mmol/L (ref 98–111)
Creatinine: 1.44 mg/dL — ABNORMAL HIGH (ref 0.61–1.24)
GFR, Estimated: 49 mL/min — ABNORMAL LOW (ref >60)
Glucose, Bld: 90 mg/dL (ref 70–99)
Potassium: 4 mmol/L (ref 3.5–5.1)
Sodium: 141 mmol/L (ref 135–145)
Total Bilirubin: 0.8 mg/dL (ref 0.3–1.2)
Total Protein: 7 g/dL (ref 6.5–8.1)

## 2021-05-07 LAB — LACTATE DEHYDROGENASE: LDH: 229 U/L — ABNORMAL HIGH (ref 98–192)

## 2021-05-07 NOTE — Progress Notes (Signed)
Hematology and Oncology Follow Up Visit  Riley Eaton [id number] [birth year] 80 y.o. 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 Riley Eaton is back for follow-up.  Overall, he is doing okay.  We last saw him back in June.  He really had no problems since we last saw him.  He does have some back issues.  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  He has had no problems with cough or shortness of breath.  Has been no change in bowel or bladder habits.  When we saw him, we did do a CT of his chest.  This was because of some lung density.  This was done on 01/17/2021.  There was a 9 mm lingular nodule which was indeterminate.  He has some peribronchovascular nodularity in the right upper lung.  He had a ascending aortic aneurysm which was stable.  Not sure what exactly these indicate.  I think this is some that will have to be followed.  He did have myeloma studies the last time that we saw him.  His monoclonal spike was 0.6 g/dL.  His IgG level was 1160 mg/dL.  However, his kappa light chain was 54 mg/dL.  This is what bothers me.  We are doing a 24-hour urine on him today.  This will hopefully shows if there is a need for further studies.  We actually may have to do a bone marrow biopsy on him.  The last one was done back in November 2021.  Overall, I would have to say that his performance status is probably ECOG 1.    Medications:  Current Outpatient Medications:    acetaminophen (TYLENOL) 325 MG tablet, Take 2 tablets (650 mg total) by mouth every 6 (six) hours as needed for mild pain (or temp > 100)., Disp: 20 tablet, Rfl: 0   allopurinol (ZYLOPRIM) 100 MG tablet, TAKE 1 TABLET BY MOUTH EVERY DAY (Patient taking differently: Take 100 mg by mouth daily.), Disp: 90 tablet, Rfl: 1   aspirin 81 MG chewable tablet, Chew 1 tablet (81 mg total) by mouth daily.,  Disp: 30 tablet, Rfl: 0   atorvastatin (LIPITOR) 40 MG tablet, Take 1 tablet (40 mg total) by mouth daily at 6 PM. (Patient taking differently: Take 40 mg by mouth at bedtime.), Disp: 30 tablet, Rfl: 0   carvedilol (COREG) 6.25 MG tablet, Take 1 tablet by mouth 2 (two) times daily., Disp: , Rfl:    colchicine 0.6 MG tablet, , Disp: , Rfl:    diphenhydramine-acetaminophen (TYLENOL PM) 25-500 MG TABS tablet, Take 1 tablet by mouth at bedtime., Disp: , Rfl:    ferrous sulfate 325 (65 FE) MG EC tablet, Take 325 mg by mouth daily., Disp: , Rfl:    finasteride (PROSCAR) 5 MG tablet, Take 1 tablet (5 mg total) by mouth daily. For urination, Disp: 90 tablet, Rfl: 3   furosemide (LASIX) 40 MG tablet, Take by mouth., Disp: , Rfl:    gabapentin (NEURONTIN) 300 MG capsule, TAKE 2 CAPSULES BY MOUTH THREE TIMES A DAY, Disp: , Rfl:    HYDROcodone-acetaminophen (NORCO/VICODIN) 5-325 MG tablet, Take 1 tablet by mouth 4 (four) times daily., Disp: , Rfl:    losartan (COZAAR) 25 MG tablet, Take 25 mg by mouth daily., Disp: , Rfl:    OLANZapine (ZYPREXA) 5 MG tablet, Take  1 tablet (5 mg total) by mouth daily., Disp: 30 tablet, Rfl: 0   ondansetron (ZOFRAN ODT) 4 MG disintegrating tablet, Take 1 tablet (4 mg total) by mouth every 8 (eight) hours as needed for nausea or vomiting., Disp: 20 tablet, Rfl: 0   pantoprazole (PROTONIX) 40 MG tablet, Take 1 tablet (40 mg total) by mouth at bedtime. (Patient taking differently: Take 40 mg by mouth daily.), Disp: 30 tablet, Rfl: 0   polyethylene glycol (MIRALAX / GLYCOLAX) 17 g packet, Take 17 g by mouth daily. (Patient taking differently: Take 17 g by mouth daily as needed (constipation).), Disp: 14 each, Rfl: 0   pregabalin (LYRICA) 150 MG capsule, Take 150 mg by mouth 2 (two) times daily., Disp: , Rfl:    traZODone (DESYREL) 50 MG tablet, Take 50 mg by mouth at bedtime., Disp: , Rfl:    vitamin B-12 (CYANOCOBALAMIN) 500 MCG tablet, Take 500 mcg by mouth daily., Disp: , Rfl:    Allergies: No Known Allergies  Past Medical History, Surgical history, Social history, and Family History were reviewed and updated.  Review of Systems: Review of Systems  Constitutional: Negative.   HENT:  Negative.    Eyes: Negative.   Respiratory: Negative.    Cardiovascular: Negative.   Gastrointestinal: Negative.   Endocrine: Negative.   Genitourinary: Negative.    Musculoskeletal: Negative.   Skin: Negative.   Neurological: Negative.   Hematological: Negative.   Psychiatric/Behavioral: Negative.     Physical Exam:  height is 5' 7" (1.702 m) and weight is 181 lb 1.9 oz (82.2 kg). His oral temperature is 98.5 F (36.9 C). His blood pressure is 117/70 and his pulse is 56 (abnormal). His respiration is 18 and oxygen saturation is 98%.   Wt Readings from Last 3 Encounters:  05/07/21 181 lb 1.9 oz (82.2 kg)  03/20/21 184 lb (83.5 kg)  01/12/21 181 lb (82.1 kg)    Physical Exam Vitals reviewed.  HENT:     Head: Normocephalic and atraumatic.  Eyes:     Pupils: Pupils are equal, round, and reactive to light.  Cardiovascular:     Rate and Rhythm: Normal rate and regular rhythm.     Heart sounds: Normal heart sounds.  Pulmonary:     Effort: Pulmonary effort is normal.     Breath sounds: Normal breath sounds.  Abdominal:     General: Bowel sounds are normal.     Palpations: Abdomen is soft.  Musculoskeletal:        General: No tenderness or deformity. Normal range of motion.     Cervical back: Normal range of motion.  Lymphadenopathy:     Cervical: No cervical adenopathy.  Skin:    General: Skin is warm and dry.     Findings: No erythema or rash.  Neurological:     Mental Status: He is alert and oriented to person, place, and time.  Psychiatric:        Behavior: Behavior normal.        Thought Content: Thought content normal.        Judgment: Judgment normal.   Lab Results  Component Value Date   WBC 3.8 (L) 05/07/2021   HGB 11.2 (L) 05/07/2021   HCT 31.1  (L) 05/07/2021   MCV 94.5 05/07/2021   PLT 120 (L) 05/07/2021     Chemistry      Component Value Date/Time   NA 141 05/07/2021 1451   NA 142 10/15/2019 1500   K 4.0 05/07/2021 1451     CL 105 05/07/2021 1451   CO2 28 05/07/2021 1451   BUN 29 (H) 05/07/2021 1451   BUN 25 10/15/2019 1500   CREATININE 1.44 (H) 05/07/2021 1451      Component Value Date/Time   CALCIUM 9.2 05/07/2021 1451   ALKPHOS 95 05/07/2021 1451   AST 21 05/07/2021 1451   ALT 13 05/07/2021 1451   BILITOT 0.8 05/07/2021 1451      Impression and Plan: Mr. Godinho is a very nice 80-year-old white male.  He is a veteran.  He served in [place].  I have a feeling that were going to have to probably institute some kind of therapy on him at some point.  I do worry about this Kappa light chain.  I noted that his BUN and creatinine are a little bit higher.  Are going to have to watch this closely.  I do not want to see him going to kidney insufficiency because of light chain deposition.  The 24-hour urine will certainly help us out with the decision about treatment.  We may have to do another bone marrow biopsy on him.  I will see what the 24-hour urine shows.  I will see what his myeloma studies show.  We will then plan to get him back according to the results.  He does have an IgG kappa protein.  Again this might be MGUS or smoldering myeloma.  He does have the abnormal chromosome so this certainly does increase his risk of at least developing myeloma.  We will see what his myeloma studies look like.  We will also have to get the CT scan set up for him.  Again I am and I cannot imagine anything in the lung that would be significant with respect to myeloma.  I would like to see him back in another 3 months.     R , MD 9/26/20223:58 PM 

## 2021-05-08 LAB — IGG, IGA, IGM
IgA: 192 mg/dL (ref 61–437)
IgG (Immunoglobin G), Serum: 1363 mg/dL (ref 603–1613)
IgM (Immunoglobulin M), Srm: 65 mg/dL (ref 15–143)

## 2021-05-08 LAB — KAPPA/LAMBDA LIGHT CHAINS
Kappa free light chain: 706.7 mg/L — ABNORMAL HIGH (ref 3.3–19.4)
Kappa, lambda light chain ratio: 23.64 — ABNORMAL HIGH (ref 0.26–1.65)
Lambda free light chains: 29.9 mg/L — ABNORMAL HIGH (ref 5.7–26.3)

## 2021-05-09 ENCOUNTER — Other Ambulatory Visit: Payer: Self-pay | Admitting: *Deleted

## 2021-05-09 DIAGNOSIS — R918 Other nonspecific abnormal finding of lung field: Secondary | ICD-10-CM

## 2021-05-09 DIAGNOSIS — D472 Monoclonal gammopathy: Secondary | ICD-10-CM | POA: Diagnosis not present

## 2021-05-10 LAB — PROTEIN ELECTROPHORESIS, SERUM, WITH REFLEX
A/G Ratio: 1.4 (ref 0.7–1.7)
Albumin ELP: 3.8 g/dL (ref 2.9–4.4)
Alpha-1-Globulin: 0.2 g/dL (ref 0.0–0.4)
Alpha-2-Globulin: 0.4 g/dL (ref 0.4–1.0)
Beta Globulin: 0.8 g/dL (ref 0.7–1.3)
Gamma Globulin: 1.3 g/dL (ref 0.4–1.8)
Globulin, Total: 2.8 g/dL (ref 2.2–3.9)
M-Spike, %: 0.6 g/dL — ABNORMAL HIGH
SPEP Interpretation: 0
Total Protein ELP: 6.6 g/dL (ref 6.0–8.5)

## 2021-05-10 LAB — IMMUNOFIXATION REFLEX, SERUM
IgA: 210 mg/dL (ref 61–437)
IgG (Immunoglobin G), Serum: 1424 mg/dL (ref 603–1613)
IgM (Immunoglobulin M), Srm: 67 mg/dL (ref 15–143)

## 2021-05-14 LAB — UPEP/UIFE/LIGHT CHAINS/TP, 24-HR UR
% BETA, Urine: 62.6 %
ALPHA 1 URINE: 2.8 %
Albumin, U: 13.9 %
Alpha 2, Urine: 7 %
Free Kappa Lt Chains,Ur: 120.9 mg/L — ABNORMAL HIGH (ref 1.17–86.46)
Free Kappa/Lambda Ratio: 13.82 (ref 1.83–14.26)
Free Lambda Lt Chains,Ur: 8.75 mg/L (ref 0.27–15.21)
GAMMA GLOBULIN URINE: 13.8 %
M-SPIKE %, Urine: 17.6 % — ABNORMAL HIGH
M-Spike, Mg/24 Hr: 35 mg/24 hr — ABNORMAL HIGH
Total Protein, Urine-Ur/day: 201 mg/24 hr — ABNORMAL HIGH (ref 30–150)
Total Protein, Urine: 13.4 mg/dL
Total Volume: 1500

## 2021-06-06 ENCOUNTER — Telehealth: Payer: Self-pay | Admitting: *Deleted

## 2021-06-06 NOTE — Telephone Encounter (Signed)
Per Antonietta Barcelona - called and scheduled appointment with Dr. Marin Olp - last OV 05/07/21

## 2021-08-03 ENCOUNTER — Other Ambulatory Visit: Payer: Self-pay | Admitting: *Deleted

## 2021-08-03 DIAGNOSIS — D8989 Other specified disorders involving the immune mechanism, not elsewhere classified: Secondary | ICD-10-CM

## 2021-08-03 DIAGNOSIS — Z7189 Other specified counseling: Secondary | ICD-10-CM

## 2021-08-03 DIAGNOSIS — R918 Other nonspecific abnormal finding of lung field: Secondary | ICD-10-CM

## 2021-08-03 DIAGNOSIS — D472 Monoclonal gammopathy: Secondary | ICD-10-CM

## 2021-08-07 ENCOUNTER — Inpatient Hospital Stay: Payer: Medicare PPO | Attending: Hematology & Oncology

## 2021-08-07 ENCOUNTER — Encounter: Payer: Self-pay | Admitting: Hematology & Oncology

## 2021-08-07 ENCOUNTER — Other Ambulatory Visit: Payer: Self-pay

## 2021-08-07 ENCOUNTER — Inpatient Hospital Stay: Payer: Medicare PPO | Admitting: Hematology & Oncology

## 2021-08-07 VITALS — BP 96/66 | HR 61 | Temp 98.0°F | Resp 19 | Wt 182.0 lb

## 2021-08-07 DIAGNOSIS — D472 Monoclonal gammopathy: Secondary | ICD-10-CM

## 2021-08-07 DIAGNOSIS — Z87891 Personal history of nicotine dependence: Secondary | ICD-10-CM | POA: Insufficient documentation

## 2021-08-07 DIAGNOSIS — R911 Solitary pulmonary nodule: Secondary | ICD-10-CM | POA: Diagnosis not present

## 2021-08-07 DIAGNOSIS — C9000 Multiple myeloma not having achieved remission: Secondary | ICD-10-CM | POA: Diagnosis not present

## 2021-08-07 DIAGNOSIS — R918 Other nonspecific abnormal finding of lung field: Secondary | ICD-10-CM

## 2021-08-07 DIAGNOSIS — Z7189 Other specified counseling: Secondary | ICD-10-CM

## 2021-08-07 DIAGNOSIS — D8989 Other specified disorders involving the immune mechanism, not elsewhere classified: Secondary | ICD-10-CM

## 2021-08-07 HISTORY — DX: Multiple myeloma not having achieved remission: C90.00

## 2021-08-07 LAB — CBC WITH DIFFERENTIAL (CANCER CENTER ONLY)
Abs Immature Granulocytes: 0.04 10*3/uL (ref 0.00–0.07)
Basophils Absolute: 0 10*3/uL (ref 0.0–0.1)
Basophils Relative: 0 %
Eosinophils Absolute: 0.3 10*3/uL (ref 0.0–0.5)
Eosinophils Relative: 6 %
HCT: 32.6 % — ABNORMAL LOW (ref 39.0–52.0)
Hemoglobin: 11.7 g/dL — ABNORMAL LOW (ref 13.0–17.0)
Immature Granulocytes: 1 %
Lymphocytes Relative: 20 %
Lymphs Abs: 1 10*3/uL (ref 0.7–4.0)
MCH: 34.1 pg — ABNORMAL HIGH (ref 26.0–34.0)
MCHC: 35.9 g/dL (ref 30.0–36.0)
MCV: 95 fL (ref 80.0–100.0)
Monocytes Absolute: 0.3 10*3/uL (ref 0.1–1.0)
Monocytes Relative: 6 %
Neutro Abs: 3.3 10*3/uL (ref 1.7–7.7)
Neutrophils Relative %: 67 %
Platelet Count: 127 10*3/uL — ABNORMAL LOW (ref 150–400)
RBC: 3.43 MIL/uL — ABNORMAL LOW (ref 4.22–5.81)
RDW: 13 % (ref 11.5–15.5)
WBC Count: 4.9 10*3/uL (ref 4.0–10.5)
nRBC: 0 % (ref 0.0–0.2)

## 2021-08-07 LAB — LACTATE DEHYDROGENASE: LDH: 241 U/L — ABNORMAL HIGH (ref 98–192)

## 2021-08-07 LAB — CMP (CANCER CENTER ONLY)
ALT: 12 U/L (ref 0–44)
AST: 19 U/L (ref 15–41)
Albumin: 4 g/dL (ref 3.5–5.0)
Alkaline Phosphatase: 108 U/L (ref 38–126)
Anion gap: 6 (ref 5–15)
BUN: 26 mg/dL — ABNORMAL HIGH (ref 8–23)
CO2: 30 mmol/L (ref 22–32)
Calcium: 9.5 mg/dL (ref 8.9–10.3)
Chloride: 104 mmol/L (ref 98–111)
Creatinine: 1.09 mg/dL (ref 0.61–1.24)
GFR, Estimated: >60 mL/min (ref >60)
Glucose, Bld: 125 mg/dL — ABNORMAL HIGH (ref 70–99)
Potassium: 4.1 mmol/L (ref 3.5–5.1)
Sodium: 140 mmol/L (ref 135–145)
Total Bilirubin: 0.7 mg/dL (ref 0.3–1.2)
Total Protein: 6.8 g/dL (ref 6.5–8.1)

## 2021-08-07 NOTE — Progress Notes (Addendum)
Hematology and Oncology Follow Up Visit  FRIEND DORFMAN [id number] [birth year] 80 y.o. 08/07/2021   Principle Diagnosis:  IgG Kappa myeloma  -- +1q and t(11:14)  Current Therapy:   Velcade/Decadron  ( 3 wk on/1 wk off) -- start on 08/24/2021     Interim History:  Mr. Eslick is back for follow-up.  Unfortunately, it looks like we may be dealing with myeloma now.  We did do a 24-hour urine on him.  He does have a lot higher kappa light chain excretion now.  He has 120 mg/L of Kappa light chain.  He does have an M spike in the urine.  With his serum studies, his M spike of 0.6 g/dL.  The IgG level is 1700 mg/dL.  However, it is the serum light chain that I worry about.  His serum kappa light chain is 71 mg/dL.  So far, has been no evidence of renal insufficiency.  However, I worry that if this myeloma does become more progressive, he will have issues.  I think that now is the time that we have to treat him.  Given his age, and his other ailments, I really think we have to be cautious.  I think that single agent Velcade along with low-dose Decadron would not be a bad idea.   I believe that he could tolerate this.  I do think that it would work.  I talked to Mr. Hazelrigg about this.  He does understand quite well while we have to do treatment.  I told him that if he has problems, he could always stop it.  He did have a nice Thanksgiving and Christmas.  He is eating okay.  He does have a lot of back issues, which is not from myeloma.  Overall, he has had no change in bowel or bladder habits.  His performance status probably ECOG 2.    Medications:  Current Outpatient Medications:    doxycycline (VIBRAMYCIN) 100 MG capsule, Take by mouth., Disp: , Rfl:    acetaminophen (TYLENOL) 325 MG tablet, Take 2 tablets (650 mg total) by mouth every 6 (six) hours as needed for mild pain (or temp > 100)., Disp: 20 tablet, Rfl: 0   allopurinol (ZYLOPRIM) 100 MG tablet, TAKE 1 TABLET BY MOUTH EVERY DAY  (Patient taking differently: Take 100 mg by mouth daily.), Disp: 90 tablet, Rfl: 1   aspirin 81 MG chewable tablet, Chew 1 tablet (81 mg total) by mouth daily., Disp: 30 tablet, Rfl: 0   atorvastatin (LIPITOR) 40 MG tablet, Take 1 tablet (40 mg total) by mouth daily at 6 PM. (Patient taking differently: Take 40 mg by mouth at bedtime.), Disp: 30 tablet, Rfl: 0   carvedilol (COREG) 6.25 MG tablet, Take 1 tablet by mouth 2 (two) times daily., Disp: , Rfl:    colchicine 0.6 MG tablet, , Disp: , Rfl:    diphenhydramine-acetaminophen (TYLENOL PM) 25-500 MG TABS tablet, Take 1 tablet by mouth at bedtime., Disp: , Rfl:    ferrous sulfate 325 (65 FE) MG EC tablet, Take 325 mg by mouth daily., Disp: , Rfl:    finasteride (PROSCAR) 5 MG tablet, Take 1 tablet (5 mg total) by mouth daily. For urination, Disp: 90 tablet, Rfl: 3   furosemide (LASIX) 40 MG tablet, Take by mouth., Disp: , Rfl:    gabapentin (NEURONTIN) 300 MG capsule, TAKE 2 CAPSULES BY MOUTH THREE TIMES A DAY, Disp: , Rfl:    HYDROcodone-acetaminophen (NORCO/VICODIN) 5-325 MG tablet, Take 1 tablet by mouth  4 (four) times daily., Disp: , Rfl:    losartan (COZAAR) 25 MG tablet, Take 25 mg by mouth daily., Disp: , Rfl:    MOVANTIK 25 MG TABS tablet, Take 25 mg by mouth daily., Disp: , Rfl:    OLANZapine (ZYPREXA) 5 MG tablet, Take 1 tablet (5 mg total) by mouth daily., Disp: 30 tablet, Rfl: 0   ondansetron (ZOFRAN ODT) 4 MG disintegrating tablet, Take 1 tablet (4 mg total) by mouth every 8 (eight) hours as needed for nausea or vomiting., Disp: 20 tablet, Rfl: 0   pantoprazole (PROTONIX) 40 MG tablet, Take 1 tablet (40 mg total) by mouth at bedtime. (Patient taking differently: Take 40 mg by mouth daily.), Disp: 30 tablet, Rfl: 0   polyethylene glycol (MIRALAX / GLYCOLAX) 17 g packet, Take 17 g by mouth daily. (Patient taking differently: Take 17 g by mouth daily as needed (constipation).), Disp: 14 each, Rfl: 0   pregabalin (LYRICA) 150 MG capsule,  Take 150 mg by mouth 2 (two) times daily., Disp: , Rfl:    traZODone (DESYREL) 50 MG tablet, Take 50 mg by mouth at bedtime., Disp: , Rfl:    vitamin B-12 (CYANOCOBALAMIN) 500 MCG tablet, Take 500 mcg by mouth daily., Disp: , Rfl:   Allergies: No Known Allergies  Past Medical History, Surgical history, Social history, and Family History were reviewed and updated.  Review of Systems: Review of Systems  Constitutional: Negative.   HENT:  Negative.    Eyes: Negative.   Respiratory: Negative.    Cardiovascular: Negative.   Gastrointestinal: Negative.   Endocrine: Negative.   Genitourinary: Negative.    Musculoskeletal: Negative.   Skin: Negative.   Neurological: Negative.   Hematological: Negative.   Psychiatric/Behavioral: Negative.     Physical Exam:  weight is 182 lb (82.6 kg). His oral temperature is 98 F (36.7 C). His blood pressure is 96/66 and his pulse is 61. His respiration is 19 and oxygen saturation is 97%.   Wt Readings from Last 3 Encounters:  08/07/21 182 lb (82.6 kg)  05/07/21 181 lb 1.9 oz (82.2 kg)  03/20/21 184 lb (83.5 kg)    Physical Exam Vitals reviewed.  HENT:     Head: Normocephalic and atraumatic.  Eyes:     Pupils: Pupils are equal, round, and reactive to light.  Cardiovascular:     Rate and Rhythm: Normal rate and regular rhythm.     Heart sounds: Normal heart sounds.  Pulmonary:     Effort: Pulmonary effort is normal.     Breath sounds: Normal breath sounds.  Abdominal:     General: Bowel sounds are normal.     Palpations: Abdomen is soft.  Musculoskeletal:        General: No tenderness or deformity. Normal range of motion.     Cervical back: Normal range of motion.  Lymphadenopathy:     Cervical: No cervical adenopathy.  Skin:    General: Skin is warm and dry.     Findings: No erythema or rash.  Neurological:     Mental Status: He is alert and oriented to person, place, and time.  Psychiatric:        Behavior: Behavior normal.         Thought Content: Thought content normal.        Judgment: Judgment normal.   Lab Results  Component Value Date   WBC 4.9 08/07/2021   HGB 11.7 (L) 08/07/2021   HCT 32.6 (L) 08/07/2021   MCV 95.0  08/07/2021   PLT 127 (L) 08/07/2021     Chemistry      Component Value Date/Time   NA 140 08/07/2021 1200   NA 142 10/15/2019 1500   K 4.1 08/07/2021 1200   CL 104 08/07/2021 1200   CO2 30 08/07/2021 1200   BUN 26 (H) 08/07/2021 1200   BUN 25 10/15/2019 1500   CREATININE 1.09 08/07/2021 1200      Component Value Date/Time   CALCIUM 9.5 08/07/2021 1200   ALKPHOS 108 08/07/2021 1200   AST 19 08/07/2021 1200   ALT 12 08/07/2021 1200   BILITOT 0.7 08/07/2021 1200      Impression and Plan: Mr. Marston is a very nice 80 year old white male.  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  I believe that he does now have myeloma.  As such, we will have to get him started on treatment.  I think that this would be reasonable given that he has a fairly significant light chain excretion and I do not want to see his kidneys be compromised.  Again we will get started in January.  Another issue we have is that when we last did scans on him, there was a nodule in the lingula.  This is 9 mm.  He did have a remote history of tobacco use.  We will have to follow-up with this.  I will do a CT scan the next week.  If there is any problems with this nodule, we will have to pursue this.  I would like to see him back myself when he starts his second cycle of treatment in February.  I believe that we will know relatively quickly how well treatment is working.  [name], MD 12/27/20221:22 PM      START ON PATHWAY REGIMEN - Multiple Myeloma and Other Plasma Cell Dyscrasias     A cycle is every 21 days:     Bortezomib      Lenalidomide      Dexamethasone   **Always confirm dose/schedule in your pharmacy ordering system**  Patient Characteristics: Multiple Myeloma, Newly Diagnosed, Transplant  Ineligible or Refused, Standard Risk Disease Classification: Multiple Myeloma R-ISS Staging: III Therapeutic Status: Newly Diagnosed Is Patient Eligible for Transplant<= Transplant Ineligible or Refused Risk Status: Standard Risk Intent of Therapy: Curative Intent, Discussed with Patient

## 2021-08-08 ENCOUNTER — Telehealth: Payer: Self-pay | Admitting: Hematology & Oncology

## 2021-08-08 LAB — IGG, IGA, IGM
IgA: 184 mg/dL (ref 61–437)
IgG (Immunoglobin G), Serum: 1343 mg/dL (ref 603–1613)
IgM (Immunoglobulin M), Srm: 61 mg/dL (ref 15–143)

## 2021-08-08 LAB — KAPPA/LAMBDA LIGHT CHAINS
Kappa free light chain: 778 mg/L — ABNORMAL HIGH (ref 3.3–19.4)
Kappa, lambda light chain ratio: 28.29 — ABNORMAL HIGH (ref 0.26–1.65)
Lambda free light chains: 27.5 mg/L — ABNORMAL HIGH (ref 5.7–26.3)

## 2021-08-08 NOTE — Telephone Encounter (Signed)
Scheduled appt per 12/27 los - patient is aware of appts on 1/13 .

## 2021-08-09 LAB — PROTEIN ELECTROPHORESIS, SERUM
A/G Ratio: 1.2 (ref 0.7–1.7)
Albumin ELP: 3.6 g/dL (ref 2.9–4.4)
Alpha-1-Globulin: 0.3 g/dL (ref 0.0–0.4)
Alpha-2-Globulin: 0.5 g/dL (ref 0.4–1.0)
Beta Globulin: 0.8 g/dL (ref 0.7–1.3)
Gamma Globulin: 1.4 g/dL (ref 0.4–1.8)
Globulin, Total: 3 g/dL (ref 2.2–3.9)
M-Spike, %: 0.6 g/dL — ABNORMAL HIGH
Total Protein ELP: 6.6 g/dL (ref 6.0–8.5)

## 2021-08-14 LAB — PROTEIN ELECTROPHORESIS, SERUM, WITH REFLEX
A/G Ratio: 1.2 (ref 0.7–1.7)
Albumin ELP: 3.6 g/dL (ref 2.9–4.4)
Alpha-1-Globulin: 0.3 g/dL (ref 0.0–0.4)
Alpha-2-Globulin: 0.5 g/dL (ref 0.4–1.0)
Beta Globulin: 0.9 g/dL (ref 0.7–1.3)
Gamma Globulin: 1.4 g/dL (ref 0.4–1.8)
Globulin, Total: 3 g/dL (ref 2.2–3.9)
M-Spike, %: 0.6 g/dL — ABNORMAL HIGH
SPEP Interpretation: 0
Total Protein ELP: 6.6 g/dL (ref 6.0–8.5)

## 2021-08-14 LAB — IMMUNOFIXATION REFLEX, SERUM
IgA: 203 mg/dL (ref 61–437)
IgG (Immunoglobin G), Serum: 1367 mg/dL (ref 603–1613)
IgM (Immunoglobulin M), Srm: 61 mg/dL (ref 15–143)

## 2021-08-16 NOTE — Progress Notes (Signed)
Pharmacist Chemotherapy Monitoring - Initial Assessment    Anticipated start date: 08/24/21   The following has been reviewed per standard work regarding the patient's treatment regimen: The patient's diagnosis, treatment plan and drug doses, and organ/hematologic function Lab orders and baseline tests specific to treatment regimen  The treatment plan start date, drug sequencing, and pre-medications Prior authorization status  Patient's documented medication list, including drug-drug interaction screen and prescriptions for anti-emetics and supportive care specific to the treatment regimen The drug concentrations, fluid compatibility, administration routes, and timing of the medications to be used The patient's access for treatment and lifetime cumulative dose history, if applicable  The patient's medication allergies and previous infusion related reactions, if applicable   Changes made to treatment plan:  N/A  Follow up needed:  N/A   Judge Stall, [place], 08/16/2021  3:11 PM

## 2021-08-21 ENCOUNTER — Ambulatory Visit (HOSPITAL_BASED_OUTPATIENT_CLINIC_OR_DEPARTMENT_OTHER)
Admission: RE | Admit: 2021-08-21 | Discharge: 2021-08-21 | Disposition: A | Discharge status: Home / Self Care | Payer: Medicare PPO | Source: Ambulatory Visit | Attending: Hematology & Oncology | Admitting: Hematology & Oncology

## 2021-08-21 ENCOUNTER — Other Ambulatory Visit: Payer: Self-pay

## 2021-08-21 ENCOUNTER — Telehealth: Payer: Self-pay | Admitting: *Deleted

## 2021-08-21 DIAGNOSIS — R911 Solitary pulmonary nodule: Secondary | ICD-10-CM | POA: Insufficient documentation

## 2021-08-21 IMAGING — CT CT CHEST W/O CM
2 of 3 series · 15 of 36 positions shown, 18 images · non-contrast
Comparison: Chest CT dated [DATE]

CLINICAL DATA: Follow-up pulmonary nodule

EXAM:
CT CHEST WITHOUT CONTRAST
TECHNIQUE: Multidetector CT imaging of the chest was performed following the
standard protocol without IV contrast.

[Series 2: thorax · axial · 0.78mm/px · z∈[-284,-24]mm · 12 of 154 slices shown, 15 images]
[im 12/154  mediastinal]
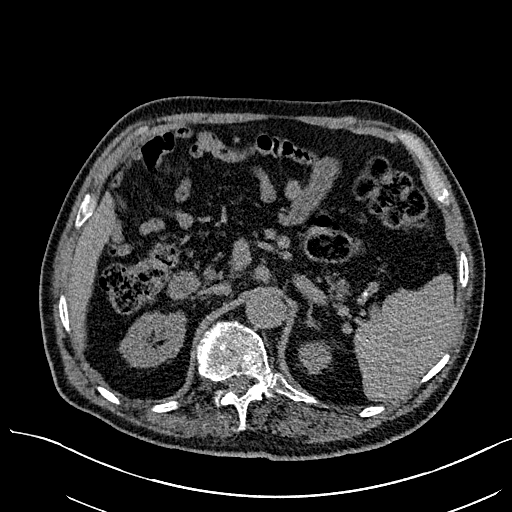
[im 12/154  lung]
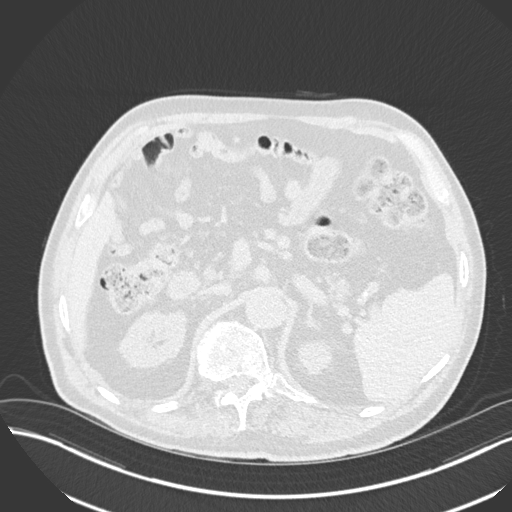
[im 23/154  lung]
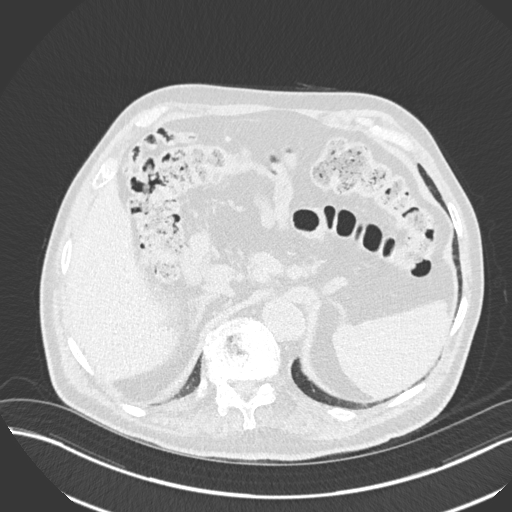
[im 35/154  lung]
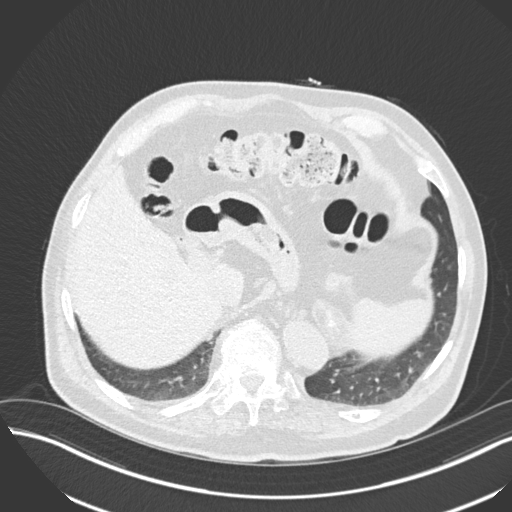
[im 46/154  lung]
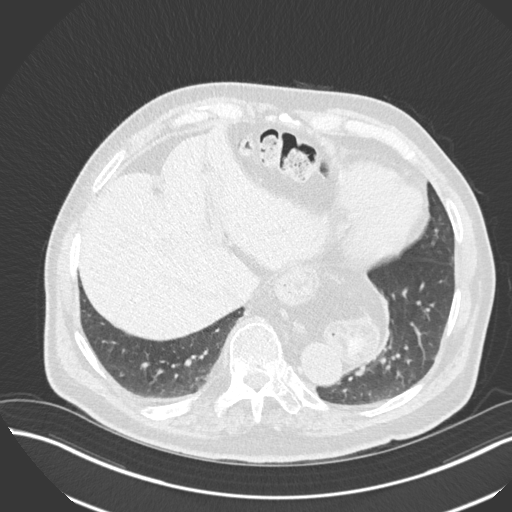
[im 57/154  mediastinal]
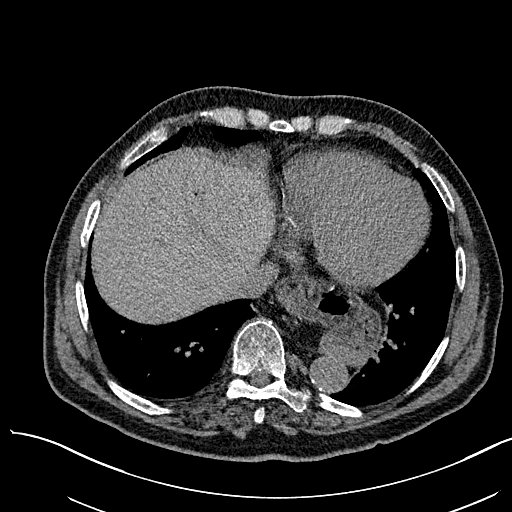
[im 57/154  lung]
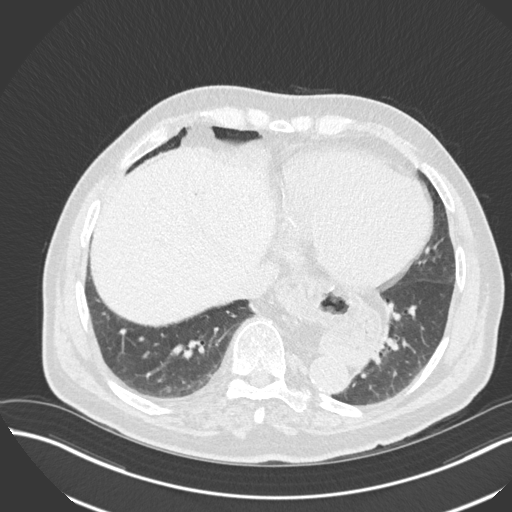
[im 69/154  lung]
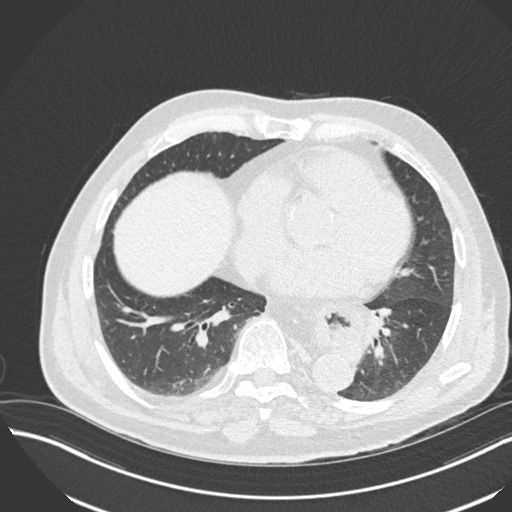
[im 86/154  lung]
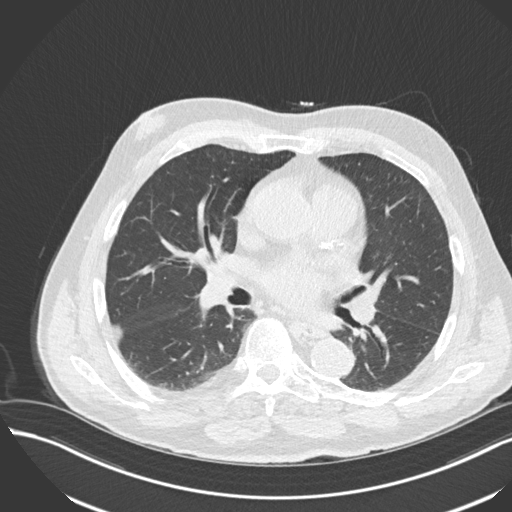
[im 97/154  lung]
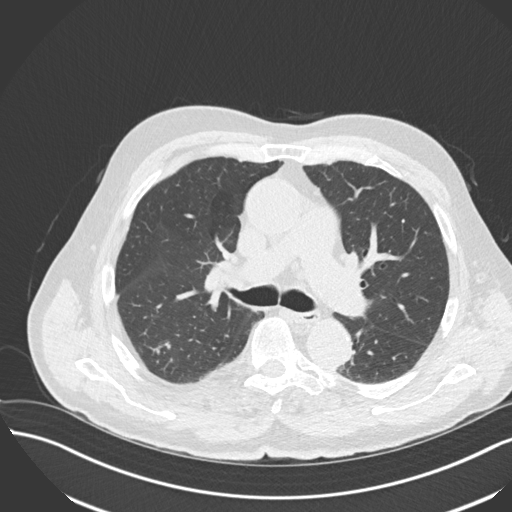
[im 108/154  mediastinal]
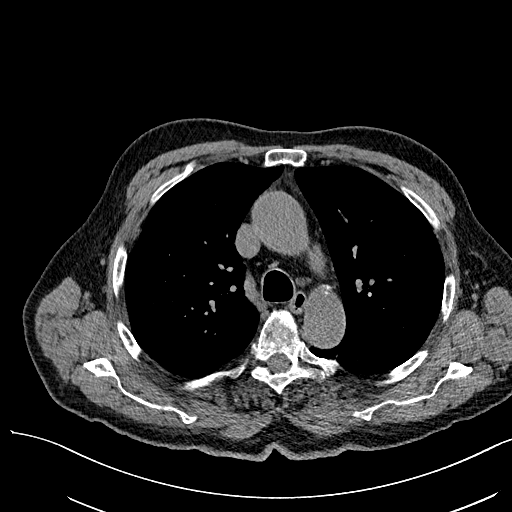
[im 108/154  lung]
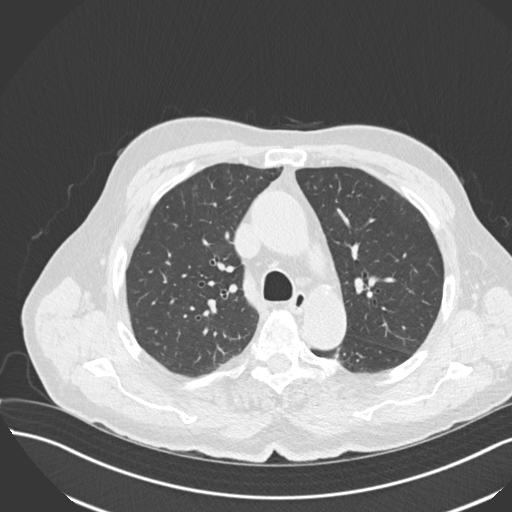
[im 120/154  lung]
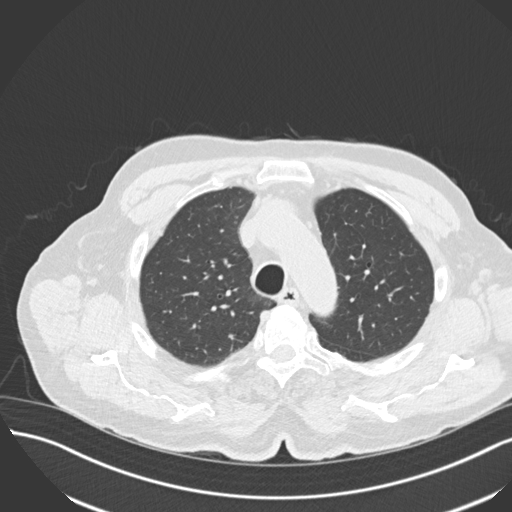
[im 131/154  lung]
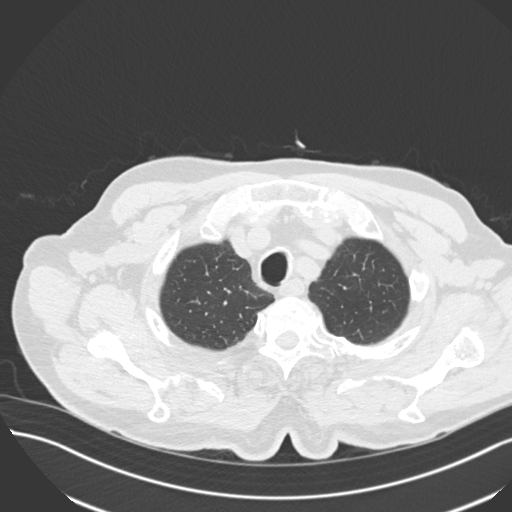
[im 142/154  lung]
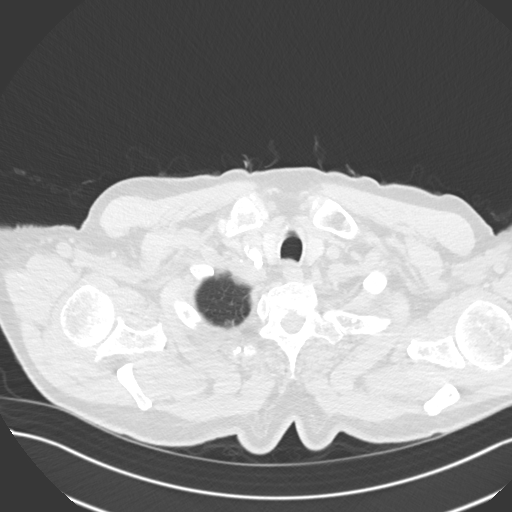

[Series 5: coronal · coronal · 0.61mm/px · 3 of 151 slices shown]
[im 31/151  lung]
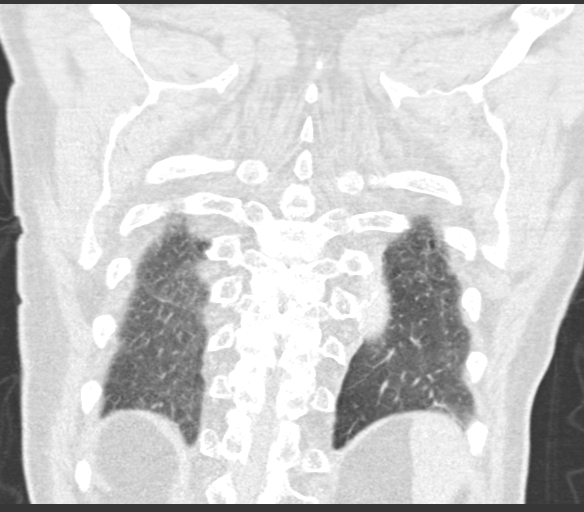
[im 61/151  lung]
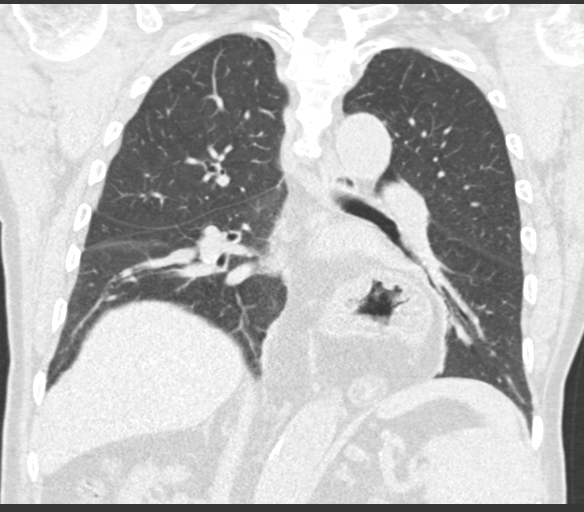
[im 91/151  lung]
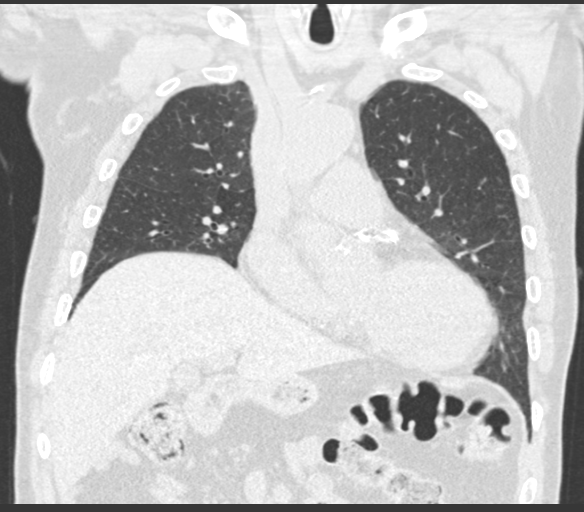

[15 of 36 positions shown; findings below may reference images not displayed]

FINDINGS: Cardiovascular: Cardiomegaly. No pericardial effusion. Left main and
three-vessel coronary artery calcifications. Atherosclerotic disease
of the thoracic aorta. Stable dilation of the ascending thoracic
aorta measuring up to 4.2 cm

Mediastinum/Nodes: No pathologically enlarged lymph nodes seen in
the chest. Large hiatal hernia.

Lungs/Pleura: Central airways are patent bibasilar atelectasis. No
consolidation, pleural effusion or pneumothorax. Irregular lingular
nodule lower is no longer present. Additional previously seen
nodules are stable or resolved. For example, solid pulmonary nodule
the right upper lobe measuring 6 mm on series 3, image 45. Clustered
solid pulmonary nodules of the posterior right upper lobe located on
image 60, decreased in number when compared to prior exam. No new or
enlarging pulmonary nodules.

Upper Abdomen: Cholecystectomy clips and pneumobilia. No acute
abnormality.

Musculoskeletal: No chest wall mass or suspicious bone lesions
identified.
IMPRESSION: 1. Irregular lingular nodule lower is no longer present, favor
resolved infectious or inflammatory process.
2. Decreased clustered nodules of the posterior right upper lobe,
likely infectious or inflammatory. No new or enlarging pulmonary
nodules.
3. Stable dilation of the ascending thoracic aorta. Recommend annual
imaging followup by CTA or MRA. This recommendation follows [2B]
ACCF/AHA/AATS/ACR/ASA/SCA/ANDRAUD/ANDRAUD/ANDRAUD/ANDRAUD Guidelines for the
Diagnosis and Management of Patients with Thoracic Aortic Disease.
Circulation. [2B]; 121: [id number]. Aortic aneurysm NOS ([2B]-[2B])
4. Large hiatal hernia.
5. Coronary artery calcifications and aortic Atherosclerosis
([2B]-[2B]).

## 2021-08-21 NOTE — Telephone Encounter (Signed)
As noted below by Dr. Marin Olp, I informed the patient that the lung nodule is gone! He verbalized understanding.

## 2021-08-21 NOTE — Telephone Encounter (Signed)
-----   Message from [name], MD sent at 08/21/2021  1:29 PM EST ----- Call - the lung nodule is gone!!!!  This is wonderful news!!  Laurey Arrow

## 2021-08-23 ENCOUNTER — Other Ambulatory Visit: Payer: Self-pay | Admitting: *Deleted

## 2021-08-23 DIAGNOSIS — D472 Monoclonal gammopathy: Secondary | ICD-10-CM

## 2021-08-23 DIAGNOSIS — C9000 Multiple myeloma not having achieved remission: Secondary | ICD-10-CM

## 2021-08-23 DIAGNOSIS — R911 Solitary pulmonary nodule: Secondary | ICD-10-CM

## 2021-08-24 ENCOUNTER — Other Ambulatory Visit: Payer: Self-pay

## 2021-08-24 ENCOUNTER — Inpatient Hospital Stay: Payer: Medicare PPO

## 2021-08-24 ENCOUNTER — Inpatient Hospital Stay: Payer: Medicare PPO | Attending: Hematology & Oncology

## 2021-08-24 VITALS — BP 123/75 | HR 51 | Temp 97.6°F | Resp 18

## 2021-08-24 DIAGNOSIS — C9000 Multiple myeloma not having achieved remission: Secondary | ICD-10-CM | POA: Diagnosis not present

## 2021-08-24 DIAGNOSIS — Z5111 Encounter for antineoplastic chemotherapy: Secondary | ICD-10-CM | POA: Diagnosis present

## 2021-08-24 DIAGNOSIS — D472 Monoclonal gammopathy: Secondary | ICD-10-CM

## 2021-08-24 DIAGNOSIS — R911 Solitary pulmonary nodule: Secondary | ICD-10-CM

## 2021-08-24 LAB — CBC WITH DIFFERENTIAL (CANCER CENTER ONLY)
Abs Immature Granulocytes: 0.04 10*3/uL (ref 0.00–0.07)
Basophils Absolute: 0 10*3/uL (ref 0.0–0.1)
Basophils Relative: 0 %
Eosinophils Absolute: 0.3 10*3/uL (ref 0.0–0.5)
Eosinophils Relative: 6 %
HCT: 35.2 % — ABNORMAL LOW (ref 39.0–52.0)
Hemoglobin: 12.2 g/dL — ABNORMAL LOW (ref 13.0–17.0)
Immature Granulocytes: 1 %
Lymphocytes Relative: 22 %
Lymphs Abs: 1.1 10*3/uL (ref 0.7–4.0)
MCH: 33.2 pg (ref 26.0–34.0)
MCHC: 34.7 g/dL (ref 30.0–36.0)
MCV: 95.7 fL (ref 80.0–100.0)
Monocytes Absolute: 0.3 10*3/uL (ref 0.1–1.0)
Monocytes Relative: 7 %
Neutro Abs: 3.3 10*3/uL (ref 1.7–7.7)
Neutrophils Relative %: 64 %
Platelet Count: 100 10*3/uL — ABNORMAL LOW (ref 150–400)
RBC: 3.68 MIL/uL — ABNORMAL LOW (ref 4.22–5.81)
RDW: 13.3 % (ref 11.5–15.5)
WBC Count: 5.1 10*3/uL (ref 4.0–10.5)
nRBC: 0 % (ref 0.0–0.2)

## 2021-08-24 LAB — CMP (CANCER CENTER ONLY)
ALT: 11 U/L (ref 0–44)
AST: 19 U/L (ref 15–41)
Albumin: 4.2 g/dL (ref 3.5–5.0)
Alkaline Phosphatase: 83 U/L (ref 38–126)
Anion gap: 5 (ref 5–15)
BUN: 23 mg/dL (ref 8–23)
CO2: 32 mmol/L (ref 22–32)
Calcium: 9.6 mg/dL (ref 8.9–10.3)
Chloride: 103 mmol/L (ref 98–111)
Creatinine: 1.09 mg/dL (ref 0.61–1.24)
GFR, Estimated: >60 mL/min (ref >60)
Glucose, Bld: 109 mg/dL — ABNORMAL HIGH (ref 70–99)
Potassium: 4.3 mmol/L (ref 3.5–5.1)
Sodium: 140 mmol/L (ref 135–145)
Total Bilirubin: 0.8 mg/dL (ref 0.3–1.2)
Total Protein: 6.5 g/dL (ref 6.5–8.1)

## 2021-08-24 MED ORDER — LIDOCAINE-PRILOCAINE 2.5-2.5 % EX CREA: TOPICAL_CREAM | CUTANEOUS | 3 refills | Status: DC

## 2021-08-24 MED ORDER — BORTEZOMIB CHEMO SQ INJECTION 3.5 MG (2.5MG/ML)
1.3000 mg/m2 | Freq: Once | INTRAMUSCULAR | Status: AC
  Administered 2021-08-24: 2.5 mg via SUBCUTANEOUS
  Filled 2021-08-24: qty 1

## 2021-08-24 MED ORDER — ACYCLOVIR 400 MG PO TABS: 400.0000 mg | ORAL_TABLET | Freq: Two times a day (BID) | ORAL | 3 refills | Status: DC

## 2021-08-24 MED ORDER — LORAZEPAM 0.5 MG PO TABS: 0.5000 mg | ORAL_TABLET | Freq: Four times a day (QID) | ORAL | 0 refills | Status: DC | PRN

## 2021-08-24 MED ORDER — PROCHLORPERAZINE MALEATE 10 MG PO TABS: 10.0000 mg | ORAL_TABLET | Freq: Four times a day (QID) | ORAL | 1 refills | Status: DC | PRN

## 2021-08-24 MED ORDER — ONDANSETRON HCL 8 MG PO TABS: 8.0000 mg | ORAL_TABLET | Freq: Two times a day (BID) | ORAL | 1 refills | Status: DC | PRN

## 2021-08-24 MED ORDER — DEXAMETHASONE 4 MG PO TABS
20.0000 mg | ORAL_TABLET | Freq: Once | ORAL | Status: AC
  Administered 2021-08-24: 20 mg via ORAL
  Filled 2021-08-24: qty 5

## 2021-08-24 NOTE — Patient Instructions (Signed)
Bortezomib injection What is this medication? BORTEZOMIB (bor TEZ oh mib) targets proteins in cancer cells and stops thecancer cells from growing. It treats multiple myeloma and mantle cell lymphoma. This medicine may be used for other purposes; ask your health care provider orpharmacist if you have questions. COMMON BRAND NAME(S): Velcade What should I tell my care team before I take this medication? They need to know if you have any of these conditions: dehydration diabetes (high blood sugar) heart disease liver disease tingling of the fingers or toes or other nerve disorder an unusual or allergic reaction to bortezomib, mannitol, boron, other medicines, foods, dyes, or preservatives pregnant or trying to get pregnant breast-feeding How should I use this medication? This medicine is injected into a vein or under the skin. It is given by ahealth care provider in a hospital or clinic setting. Talk to your health care provider about the use of this medicine in children.Special care may be needed. Overdosage: If you think you have taken too much of this medicine contact apoison control center or emergency room at once. NOTE: This medicine is only for you. Do not share this medicine with others. What if I miss a dose? Keep appointments for follow-up doses. It is important not to miss your dose.Call your health care provider if you are unable to keep an appointment. What may interact with this medication? This medicine may interact with the following medications: ketoconazole rifampin This list may not describe all possible interactions. Give your health care provider a list of all the medicines, herbs, non-prescription drugs, or dietary supplements you use. Also tell them if you smoke, drink alcohol, or use illegaldrugs. Some items may interact with your medicine. What should I watch for while using this medication? Your condition will be monitored carefully while you are receiving  thismedicine. You may need blood work done while you are taking this medicine. You may get drowsy or dizzy. Do not drive, use machinery, or do anything that needs mental alertness until you know how this medicine affects you. Do not stand up or sit up quickly, especially if you are an older patient. Thisreduces the risk of dizzy or fainting spells This medicine may increase your risk of getting an infection. Call your health care provider for advice if you get a fever, chills, sore throat, or other symptoms of a cold or flu. Do not treat yourself. Try to avoid being aroundpeople who are sick. Check with your health care provider if you have severe diarrhea, nausea, and vomiting, or if you sweat a lot. The loss of too much body fluid may make itdangerous for you to take this medicine. Do not become pregnant while taking this medicine or for 7 months after stopping it. Women should inform their health care provider if they wish to become pregnant or think they might be pregnant. Men should not father a child while taking this medicine and for 4 months after stopping it. There is a potential for serious harm to an unborn child. Talk to your health care provider for more information. Do not breast-feed an infant while taking thismedicine or for 2 months after stopping it. This medicine may make it more difficult to get pregnant or father a child.Talk to your health care provider if you are concerned about your fertility. What side effects may I notice from receiving this medication? Side effects that you should report to your doctor or health care professionalas soon as possible: allergic reactions (skin rash; itching or hives; swelling   care professional as soon as possible: ?allergic reactions (skin rash; itching or hives; swelling of the face, lips, or tongue) ?bleeding (bloody or black, tarry stools; red or dark brown urine; spitting up blood or brown material that looks like coffee grounds; red spots on the skin; unusual bruising or bleeding from the eye, gums, or nose) ?blurred vision or changes  in vision ?confusion ?constipation ?headache ?heart failure (trouble breathing; fast, irregular heartbeat; sudden weight gain; swelling of the ankles, feet, hands) ?infection (fever, chills, cough, sore throat, pain or trouble passing urine) ?lack or loss of appetite ?liver injury (dark yellow or brown urine; general ill feeling or flu-like symptoms; loss of appetite, right upper belly pain; yellowing of the eyes or skin) ?low blood pressure (dizziness; feeling faint or lightheaded, falls; unusually weak or tired) ?muscle cramps ?pain, redness, or irritation at site where injected ?pain, tingling, numbness in the hands or feet ?seizures ?trouble breathing ?unusual bruising or bleeding ?Side effects that usually do not require medical attention (report to your doctor or health care professional if they continue or are bothersome): ?diarrhea ?nausea ?stomach pain ?trouble sleeping ?vomiting ?This list may not describe all possible side effects. Call your doctor for medical advice about side effects. You may report side effects to FDA at 1-800-FDA-1088. ?Where should I keep my medication? ?This medicine is given in a hospital or clinic. It will not be stored at home. ?NOTE: This sheet is a summary. It may not cover all possible information. If you have questions about this medicine, talk to your doctor, pharmacist, or health care provider. ?? 2022 [employer]vier/Gold Standard (2020-07-20 00:00:00) ?

## 2021-08-31 ENCOUNTER — Inpatient Hospital Stay: Payer: Medicare PPO

## 2021-08-31 ENCOUNTER — Other Ambulatory Visit: Payer: Self-pay

## 2021-08-31 VITALS — BP 98/63 | HR 54 | Temp 98.0°F | Resp 19

## 2021-08-31 DIAGNOSIS — C9000 Multiple myeloma not having achieved remission: Secondary | ICD-10-CM | POA: Diagnosis not present

## 2021-08-31 DIAGNOSIS — D472 Monoclonal gammopathy: Secondary | ICD-10-CM

## 2021-08-31 LAB — CMP (CANCER CENTER ONLY)
ALT: 19 U/L (ref 0–44)
AST: 28 U/L (ref 15–41)
Albumin: 4.4 g/dL (ref 3.5–5.0)
Alkaline Phosphatase: 100 U/L (ref 38–126)
Anion gap: 6 (ref 5–15)
BUN: 28 mg/dL — ABNORMAL HIGH (ref 8–23)
CO2: 31 mmol/L (ref 22–32)
Calcium: 9.6 mg/dL (ref 8.9–10.3)
Chloride: 102 mmol/L (ref 98–111)
Creatinine: 1.2 mg/dL (ref 0.61–1.24)
GFR, Estimated: >60 mL/min (ref >60)
Glucose, Bld: 112 mg/dL — ABNORMAL HIGH (ref 70–99)
Potassium: 4.3 mmol/L (ref 3.5–5.1)
Sodium: 139 mmol/L (ref 135–145)
Total Bilirubin: 0.8 mg/dL (ref 0.3–1.2)
Total Protein: 7 g/dL (ref 6.5–8.1)

## 2021-08-31 LAB — CBC WITH DIFFERENTIAL (CANCER CENTER ONLY)
Abs Immature Granulocytes: 0.08 10*3/uL — ABNORMAL HIGH (ref 0.00–0.07)
Basophils Absolute: 0 10*3/uL (ref 0.0–0.1)
Basophils Relative: 0 %
Eosinophils Absolute: 0.4 10*3/uL (ref 0.0–0.5)
Eosinophils Relative: 6 %
HCT: 36.1 % — ABNORMAL LOW (ref 39.0–52.0)
Hemoglobin: 12.5 g/dL — ABNORMAL LOW (ref 13.0–17.0)
Immature Granulocytes: 1 %
Lymphocytes Relative: 17 %
Lymphs Abs: 1.1 10*3/uL (ref 0.7–4.0)
MCH: 33.6 pg (ref 26.0–34.0)
MCHC: 34.6 g/dL (ref 30.0–36.0)
MCV: 97 fL (ref 80.0–100.0)
Monocytes Absolute: 0.4 10*3/uL (ref 0.1–1.0)
Monocytes Relative: 6 %
Neutro Abs: 4.3 10*3/uL (ref 1.7–7.7)
Neutrophils Relative %: 70 %
Platelet Count: 90 10*3/uL — ABNORMAL LOW (ref 150–400)
RBC: 3.72 MIL/uL — ABNORMAL LOW (ref 4.22–5.81)
RDW: 13.2 % (ref 11.5–15.5)
WBC Count: 6.2 10*3/uL (ref 4.0–10.5)
nRBC: 0 % (ref 0.0–0.2)

## 2021-08-31 MED ORDER — DEXAMETHASONE 4 MG PO TABS
20.0000 mg | ORAL_TABLET | Freq: Once | ORAL | Status: AC
  Administered 2021-08-31: 20 mg via ORAL
  Filled 2021-08-31: qty 5

## 2021-08-31 MED ORDER — BORTEZOMIB CHEMO SQ INJECTION 3.5 MG (2.5MG/ML)
1.3000 mg/m2 | Freq: Once | INTRAMUSCULAR | Status: AC
  Administered 2021-08-31: 2.5 mg via SUBCUTANEOUS
  Filled 2021-08-31: qty 1

## 2021-08-31 NOTE — Progress Notes (Signed)
Ok to treat with today's lab results values per Lottie Dawson, NP.

## 2021-08-31 NOTE — Patient Instructions (Signed)
Bortezomib injection What is this medication? BORTEZOMIB (bor TEZ oh mib) targets proteins in cancer cells and stops thecancer cells from growing. It treats multiple myeloma and mantle cell lymphoma. This medicine may be used for other purposes; ask your health care provider orpharmacist if you have questions. COMMON BRAND NAME(S): Velcade What should I tell my care team before I take this medication? They need to know if you have any of these conditions: dehydration diabetes (high blood sugar) heart disease liver disease tingling of the fingers or toes or other nerve disorder an unusual or allergic reaction to bortezomib, mannitol, boron, other medicines, foods, dyes, or preservatives pregnant or trying to get pregnant breast-feeding How should I use this medication? This medicine is injected into a vein or under the skin. It is given by ahealth care provider in a hospital or clinic setting. Talk to your health care provider about the use of this medicine in children.Special care may be needed. Overdosage: If you think you have taken too much of this medicine contact apoison control center or emergency room at once. NOTE: This medicine is only for you. Do not share this medicine with others. What if I miss a dose? Keep appointments for follow-up doses. It is important not to miss your dose.Call your health care provider if you are unable to keep an appointment. What may interact with this medication? This medicine may interact with the following medications: ketoconazole rifampin This list may not describe all possible interactions. Give your health care provider a list of all the medicines, herbs, non-prescription drugs, or dietary supplements you use. Also tell them if you smoke, drink alcohol, or use illegaldrugs. Some items may interact with your medicine. What should I watch for while using this medication? Your condition will be monitored carefully while you are receiving  thismedicine. You may need blood work done while you are taking this medicine. You may get drowsy or dizzy. Do not drive, use machinery, or do anything that needs mental alertness until you know how this medicine affects you. Do not stand up or sit up quickly, especially if you are an older patient. Thisreduces the risk of dizzy or fainting spells This medicine may increase your risk of getting an infection. Call your health care provider for advice if you get a fever, chills, sore throat, or other symptoms of a cold or flu. Do not treat yourself. Try to avoid being aroundpeople who are sick. Check with your health care provider if you have severe diarrhea, nausea, and vomiting, or if you sweat a lot. The loss of too much body fluid may make itdangerous for you to take this medicine. Do not become pregnant while taking this medicine or for 7 months after stopping it. Women should inform their health care provider if they wish to become pregnant or think they might be pregnant. Men should not father a child while taking this medicine and for 4 months after stopping it. There is a potential for serious harm to an unborn child. Talk to your health care provider for more information. Do not breast-feed an infant while taking thismedicine or for 2 months after stopping it. This medicine may make it more difficult to get pregnant or father a child.Talk to your health care provider if you are concerned about your fertility. What side effects may I notice from receiving this medication? Side effects that you should report to your doctor or health care professionalas soon as possible: allergic reactions (skin rash; itching or hives; swelling   care professional as soon as possible: ?allergic reactions (skin rash; itching or hives; swelling of the face, lips, or tongue) ?bleeding (bloody or black, tarry stools; red or dark brown urine; spitting up blood or brown material that looks like coffee grounds; red spots on the skin; unusual bruising or bleeding from the eye, gums, or nose) ?blurred vision or changes  in vision ?confusion ?constipation ?headache ?heart failure (trouble breathing; fast, irregular heartbeat; sudden weight gain; swelling of the ankles, feet, hands) ?infection (fever, chills, cough, sore throat, pain or trouble passing urine) ?lack or loss of appetite ?liver injury (dark yellow or brown urine; general ill feeling or flu-like symptoms; loss of appetite, right upper belly pain; yellowing of the eyes or skin) ?low blood pressure (dizziness; feeling faint or lightheaded, falls; unusually weak or tired) ?muscle cramps ?pain, redness, or irritation at site where injected ?pain, tingling, numbness in the hands or feet ?seizures ?trouble breathing ?unusual bruising or bleeding ?Side effects that usually do not require medical attention (report to your doctor or health care professional if they continue or are bothersome): ?diarrhea ?nausea ?stomach pain ?trouble sleeping ?vomiting ?This list may not describe all possible side effects. Call your doctor for medical advice about side effects. You may report side effects to FDA at 1-800-FDA-1088. ?Where should I keep my medication? ?This medicine is given in a hospital or clinic. It will not be stored at home. ?NOTE: This sheet is a summary. It may not cover all possible information. If you have questions about this medicine, talk to your doctor, pharmacist, or health care provider. ?? 2022 [employer]vier/Gold Standard (2020-07-20 00:00:00) ?

## 2021-09-03 ENCOUNTER — Other Ambulatory Visit: Payer: Self-pay | Admitting: Cardiovascular Disease

## 2021-09-07 ENCOUNTER — Inpatient Hospital Stay: Payer: Medicare PPO

## 2021-09-07 ENCOUNTER — Other Ambulatory Visit: Payer: Self-pay

## 2021-09-07 VITALS — BP 120/77 | HR 58 | Temp 97.8°F | Resp 18

## 2021-09-07 DIAGNOSIS — D472 Monoclonal gammopathy: Secondary | ICD-10-CM

## 2021-09-07 DIAGNOSIS — C9000 Multiple myeloma not having achieved remission: Secondary | ICD-10-CM | POA: Diagnosis not present

## 2021-09-07 LAB — CMP (CANCER CENTER ONLY)
ALT: 16 U/L (ref 0–44)
AST: 19 U/L (ref 15–41)
Albumin: 4.2 g/dL (ref 3.5–5.0)
Alkaline Phosphatase: 91 U/L (ref 38–126)
Anion gap: 6 (ref 5–15)
BUN: 24 mg/dL — ABNORMAL HIGH (ref 8–23)
CO2: 29 mmol/L (ref 22–32)
Calcium: 9.5 mg/dL (ref 8.9–10.3)
Chloride: 105 mmol/L (ref 98–111)
Creatinine: 1.19 mg/dL (ref 0.61–1.24)
GFR, Estimated: >60 mL/min (ref >60)
Glucose, Bld: 100 mg/dL — ABNORMAL HIGH (ref 70–99)
Potassium: 4.1 mmol/L (ref 3.5–5.1)
Sodium: 140 mmol/L (ref 135–145)
Total Bilirubin: 0.8 mg/dL (ref 0.3–1.2)
Total Protein: 6.6 g/dL (ref 6.5–8.1)

## 2021-09-07 LAB — CBC WITH DIFFERENTIAL (CANCER CENTER ONLY)
Abs Immature Granulocytes: 0.06 10*3/uL (ref 0.00–0.07)
Basophils Absolute: 0 10*3/uL (ref 0.0–0.1)
Basophils Relative: 0 %
Eosinophils Absolute: 0.3 10*3/uL (ref 0.0–0.5)
Eosinophils Relative: 6 %
HCT: 34.8 % — ABNORMAL LOW (ref 39.0–52.0)
Hemoglobin: 12.4 g/dL — ABNORMAL LOW (ref 13.0–17.0)
Immature Granulocytes: 1 %
Lymphocytes Relative: 20 %
Lymphs Abs: 1 10*3/uL (ref 0.7–4.0)
MCH: 33.7 pg (ref 26.0–34.0)
MCHC: 35.6 g/dL (ref 30.0–36.0)
MCV: 94.6 fL (ref 80.0–100.0)
Monocytes Absolute: 0.3 10*3/uL (ref 0.1–1.0)
Monocytes Relative: 6 %
Neutro Abs: 3.3 10*3/uL (ref 1.7–7.7)
Neutrophils Relative %: 67 %
Platelet Count: 100 10*3/uL — ABNORMAL LOW (ref 150–400)
RBC: 3.68 MIL/uL — ABNORMAL LOW (ref 4.22–5.81)
RDW: 13.1 % (ref 11.5–15.5)
WBC Count: 5 10*3/uL (ref 4.0–10.5)
nRBC: 0 % (ref 0.0–0.2)

## 2021-09-07 MED ORDER — DEXAMETHASONE 4 MG PO TABS
20.0000 mg | ORAL_TABLET | Freq: Once | ORAL | Status: AC
  Administered 2021-09-07: 20 mg via ORAL
  Filled 2021-09-07: qty 5

## 2021-09-07 MED ORDER — BORTEZOMIB CHEMO SQ INJECTION 3.5 MG (2.5MG/ML)
1.3000 mg/m2 | Freq: Once | INTRAMUSCULAR | Status: AC
  Administered 2021-09-07: 2.5 mg via SUBCUTANEOUS
  Filled 2021-09-07: qty 1

## 2021-09-07 NOTE — Patient Instructions (Signed)
Bortezomib injection What is this medication? BORTEZOMIB (bor TEZ oh mib) targets proteins in cancer cells and stops thecancer cells from growing. It treats multiple myeloma and mantle cell lymphoma. This medicine may be used for other purposes; ask your health care provider orpharmacist if you have questions. COMMON BRAND NAME(S): Velcade What should I tell my care team before I take this medication? They need to know if you have any of these conditions: dehydration diabetes (high blood sugar) heart disease liver disease tingling of the fingers or toes or other nerve disorder an unusual or allergic reaction to bortezomib, mannitol, boron, other medicines, foods, dyes, or preservatives pregnant or trying to get pregnant breast-feeding How should I use this medication? This medicine is injected into a vein or under the skin. It is given by ahealth care provider in a hospital or clinic setting. Talk to your health care provider about the use of this medicine in children.Special care may be needed. Overdosage: If you think you have taken too much of this medicine contact apoison control center or emergency room at once. NOTE: This medicine is only for you. Do not share this medicine with others. What if I miss a dose? Keep appointments for follow-up doses. It is important not to miss your dose.Call your health care provider if you are unable to keep an appointment. What may interact with this medication? This medicine may interact with the following medications: ketoconazole rifampin This list may not describe all possible interactions. Give your health care provider a list of all the medicines, herbs, non-prescription drugs, or dietary supplements you use. Also tell them if you smoke, drink alcohol, or use illegaldrugs. Some items may interact with your medicine. What should I watch for while using this medication? Your condition will be monitored carefully while you are receiving  thismedicine. You may need blood work done while you are taking this medicine. You may get drowsy or dizzy. Do not drive, use machinery, or do anything that needs mental alertness until you know how this medicine affects you. Do not stand up or sit up quickly, especially if you are an older patient. Thisreduces the risk of dizzy or fainting spells This medicine may increase your risk of getting an infection. Call your health care provider for advice if you get a fever, chills, sore throat, or other symptoms of a cold or flu. Do not treat yourself. Try to avoid being aroundpeople who are sick. Check with your health care provider if you have severe diarrhea, nausea, and vomiting, or if you sweat a lot. The loss of too much body fluid may make itdangerous for you to take this medicine. Do not become pregnant while taking this medicine or for 7 months after stopping it. Women should inform their health care provider if they wish to become pregnant or think they might be pregnant. Men should not father a child while taking this medicine and for 4 months after stopping it. There is a potential for serious harm to an unborn child. Talk to your health care provider for more information. Do not breast-feed an infant while taking thismedicine or for 2 months after stopping it. This medicine may make it more difficult to get pregnant or father a child.Talk to your health care provider if you are concerned about your fertility. What side effects may I notice from receiving this medication? Side effects that you should report to your doctor or health care professionalas soon as possible: allergic reactions (skin rash; itching or hives; swelling   care professional as soon as possible: ?allergic reactions (skin rash; itching or hives; swelling of the face, lips, or tongue) ?bleeding (bloody or black, tarry stools; red or dark brown urine; spitting up blood or brown material that looks like coffee grounds; red spots on the skin; unusual bruising or bleeding from the eye, gums, or nose) ?blurred vision or changes  in vision ?confusion ?constipation ?headache ?heart failure (trouble breathing; fast, irregular heartbeat; sudden weight gain; swelling of the ankles, feet, hands) ?infection (fever, chills, cough, sore throat, pain or trouble passing urine) ?lack or loss of appetite ?liver injury (dark yellow or brown urine; general ill feeling or flu-like symptoms; loss of appetite, right upper belly pain; yellowing of the eyes or skin) ?low blood pressure (dizziness; feeling faint or lightheaded, falls; unusually weak or tired) ?muscle cramps ?pain, redness, or irritation at site where injected ?pain, tingling, numbness in the hands or feet ?seizures ?trouble breathing ?unusual bruising or bleeding ?Side effects that usually do not require medical attention (report to your doctor or health care professional if they continue or are bothersome): ?diarrhea ?nausea ?stomach pain ?trouble sleeping ?vomiting ?This list may not describe all possible side effects. Call your doctor for medical advice about side effects. You may report side effects to FDA at 1-800-FDA-1088. ?Where should I keep my medication? ?This medicine is given in a hospital or clinic. It will not be stored at home. ?NOTE: This sheet is a summary. It may not cover all possible information. If you have questions about this medicine, talk to your doctor, pharmacist, or health care provider. ?? 2022 [employer]vier/Gold Standard (2020-07-20 00:00:00) ?

## 2021-09-14 ENCOUNTER — Ambulatory Visit: Payer: Medicare PPO

## 2021-09-14 ENCOUNTER — Other Ambulatory Visit: Payer: Medicare PPO

## 2021-09-21 ENCOUNTER — Encounter: Payer: Self-pay | Admitting: Hematology & Oncology

## 2021-09-21 ENCOUNTER — Inpatient Hospital Stay: Payer: Medicare PPO | Attending: Hematology & Oncology

## 2021-09-21 ENCOUNTER — Inpatient Hospital Stay: Payer: Medicare PPO | Admitting: Hematology & Oncology

## 2021-09-21 ENCOUNTER — Other Ambulatory Visit: Payer: Self-pay

## 2021-09-21 ENCOUNTER — Inpatient Hospital Stay: Payer: Medicare PPO

## 2021-09-21 VITALS — BP 102/68 | HR 58 | Temp 97.9°F | Resp 20 | Wt 187.0 lb

## 2021-09-21 DIAGNOSIS — C9000 Multiple myeloma not having achieved remission: Secondary | ICD-10-CM | POA: Diagnosis present

## 2021-09-21 DIAGNOSIS — D472 Monoclonal gammopathy: Secondary | ICD-10-CM

## 2021-09-21 DIAGNOSIS — Z5111 Encounter for antineoplastic chemotherapy: Secondary | ICD-10-CM | POA: Diagnosis present

## 2021-09-21 LAB — CBC WITH DIFFERENTIAL (CANCER CENTER ONLY)
Abs Immature Granulocytes: 0.03 10*3/uL (ref 0.00–0.07)
Basophils Absolute: 0 10*3/uL (ref 0.0–0.1)
Basophils Relative: 0 %
Eosinophils Absolute: 0.3 10*3/uL (ref 0.0–0.5)
Eosinophils Relative: 6 %
HCT: 35.9 % — ABNORMAL LOW (ref 39.0–52.0)
Hemoglobin: 12.5 g/dL — ABNORMAL LOW (ref 13.0–17.0)
Immature Granulocytes: 1 %
Lymphocytes Relative: 20 %
Lymphs Abs: 0.9 10*3/uL (ref 0.7–4.0)
MCH: 33.8 pg (ref 26.0–34.0)
MCHC: 34.8 g/dL (ref 30.0–36.0)
MCV: 97 fL (ref 80.0–100.0)
Monocytes Absolute: 0.4 10*3/uL (ref 0.1–1.0)
Monocytes Relative: 8 %
Neutro Abs: 3.1 10*3/uL (ref 1.7–7.7)
Neutrophils Relative %: 65 %
Platelet Count: 95 10*3/uL — ABNORMAL LOW (ref 150–400)
RBC: 3.7 MIL/uL — ABNORMAL LOW (ref 4.22–5.81)
RDW: 13.2 % (ref 11.5–15.5)
WBC Count: 4.7 10*3/uL (ref 4.0–10.5)
nRBC: 0 % (ref 0.0–0.2)

## 2021-09-21 LAB — CMP (CANCER CENTER ONLY)
ALT: 12 U/L (ref 0–44)
AST: 20 U/L (ref 15–41)
Albumin: 3.9 g/dL (ref 3.5–5.0)
Alkaline Phosphatase: 84 U/L (ref 38–126)
Anion gap: 5 (ref 5–15)
BUN: 24 mg/dL — ABNORMAL HIGH (ref 8–23)
CO2: 31 mmol/L (ref 22–32)
Calcium: 8.8 mg/dL — ABNORMAL LOW (ref 8.9–10.3)
Chloride: 102 mmol/L (ref 98–111)
Creatinine: 1.2 mg/dL (ref 0.61–1.24)
GFR, Estimated: >60 mL/min (ref >60)
Glucose, Bld: 113 mg/dL — ABNORMAL HIGH (ref 70–99)
Potassium: 4.3 mmol/L (ref 3.5–5.1)
Sodium: 138 mmol/L (ref 135–145)
Total Bilirubin: 0.7 mg/dL (ref 0.3–1.2)
Total Protein: 6.6 g/dL (ref 6.5–8.1)

## 2021-09-21 LAB — LACTATE DEHYDROGENASE: LDH: 248 U/L — ABNORMAL HIGH (ref 98–192)

## 2021-09-21 MED ORDER — BORTEZOMIB CHEMO SQ INJECTION 3.5 MG (2.5MG/ML)
1.3000 mg/m2 | Freq: Once | INTRAMUSCULAR | Status: AC
  Administered 2021-09-21: 2.5 mg via SUBCUTANEOUS
  Filled 2021-09-21: qty 1

## 2021-09-21 MED ORDER — DEXAMETHASONE 4 MG PO TABS
20.0000 mg | ORAL_TABLET | Freq: Once | ORAL | Status: AC
  Administered 2021-09-21: 20 mg via ORAL
  Filled 2021-09-21: qty 5

## 2021-09-21 NOTE — Progress Notes (Signed)
Hematology and Oncology Follow Up Visit  Riley Eaton [id number] Oct 17, 1940 81 y.o. 09/21/2021   Principle Diagnosis:  IgG Kappa myeloma  -- +1q and t(11:14)  Current Therapy:   Velcade/Decadron -- ( 3 wk on/1 wk off) -- s/p cycle #1 -- start on 08/24/2021     Interim History:  Riley Eaton is back for follow-up.  He is doing quite well.  He is tolerated his first cycle of treatment with Velcade/Decadron.  He had no problems with nausea or vomiting.  He has had no change in bowel or bladder habits.  He has had no issues with cough or shortness of breath.  He has had no leg swelling.  He has had no bleeding.  His appetite has been pretty good.  Overall, his performance status is ECOG 2.    Medications:  Current Outpatient Medications:    acetaminophen (TYLENOL) 325 MG tablet, Take 2 tablets (650 mg total) by mouth every 6 (six) hours as needed for mild pain (or temp > 100)., Disp: 20 tablet, Rfl: 0   acyclovir (ZOVIRAX) 400 MG tablet, Take 1 tablet (400 mg total) by mouth 2 (two) times daily., Disp: 60 tablet, Rfl: 3   allopurinol (ZYLOPRIM) 100 MG tablet, TAKE 1 TABLET BY MOUTH EVERY DAY (Patient taking differently: Take 100 mg by mouth daily.), Disp: 90 tablet, Rfl: 1   aspirin 81 MG chewable tablet, Chew 1 tablet (81 mg total) by mouth daily., Disp: 30 tablet, Rfl: 0   atorvastatin (LIPITOR) 40 MG tablet, Take 1 tablet (40 mg total) by mouth daily at 6 PM. (Patient taking differently: Take 40 mg by mouth at bedtime.), Disp: 30 tablet, Rfl: 0   carvedilol (COREG) 6.25 MG tablet, Take 1 tablet by mouth 2 (two) times daily., Disp: , Rfl:    colchicine 0.6 MG tablet, , Disp: , Rfl:    diphenhydramine-acetaminophen (TYLENOL PM) 25-500 MG TABS tablet, Take 1 tablet by mouth at bedtime., Disp: , Rfl:    doxycycline (VIBRAMYCIN) 100 MG capsule, Take by mouth., Disp: , Rfl:    ferrous sulfate 325 (65 FE) MG EC tablet, Take 325 mg by mouth daily., Disp: , Rfl:    finasteride (PROSCAR) 5 MG  tablet, Take 1 tablet (5 mg total) by mouth daily. For urination, Disp: 90 tablet, Rfl: 3   furosemide (LASIX) 40 MG tablet, TAKE 1 TABLET BY MOUTH EVERY DAY, Disp: 90 tablet, Rfl: 3   gabapentin (NEURONTIN) 300 MG capsule, TAKE 2 CAPSULES BY MOUTH THREE TIMES A DAY, Disp: , Rfl:    HYDROcodone-acetaminophen (NORCO/VICODIN) 5-325 MG tablet, Take 1 tablet by mouth 4 (four) times daily., Disp: , Rfl:    lidocaine-prilocaine (EMLA) cream, Apply to affected area once, Disp: 30 g, Rfl: 3   LORazepam (ATIVAN) 0.5 MG tablet, Take 1 tablet (0.5 mg total) by mouth every 6 (six) hours as needed (Nausea or vomiting)., Disp: 30 tablet, Rfl: 0   losartan (COZAAR) 25 MG tablet, Take 25 mg by mouth daily., Disp: , Rfl:    MOVANTIK 25 MG TABS tablet, Take 25 mg by mouth daily., Disp: , Rfl:    OLANZapine (ZYPREXA) 5 MG tablet, Take 1 tablet (5 mg total) by mouth daily., Disp: 30 tablet, Rfl: 0   ondansetron (ZOFRAN ODT) 4 MG disintegrating tablet, Take 1 tablet (4 mg total) by mouth every 8 (eight) hours as needed for nausea or vomiting., Disp: 20 tablet, Rfl: 0   ondansetron (ZOFRAN) 8 MG tablet, Take 1 tablet (8 mg total)  by mouth 2 (two) times daily as needed (Nausea or vomiting)., Disp: 30 tablet, Rfl: 1   pantoprazole (PROTONIX) 40 MG tablet, Take 1 tablet (40 mg total) by mouth at bedtime. (Patient taking differently: Take 40 mg by mouth daily.), Disp: 30 tablet, Rfl: 0   polyethylene glycol (MIRALAX / GLYCOLAX) 17 g packet, Take 17 g by mouth daily. (Patient taking differently: Take 17 g by mouth daily as needed (constipation).), Disp: 14 each, Rfl: 0   pregabalin (LYRICA) 150 MG capsule, Take 150 mg by mouth 2 (two) times daily., Disp: , Rfl:    prochlorperazine (COMPAZINE) 10 MG tablet, Take 1 tablet (10 mg total) by mouth every 6 (six) hours as needed (Nausea or vomiting)., Disp: 30 tablet, Rfl: 1   traZODone (DESYREL) 50 MG tablet, Take 50 mg by mouth at bedtime., Disp: , Rfl:    vitamin B-12  (CYANOCOBALAMIN) 500 MCG tablet, Take 500 mcg by mouth daily., Disp: , Rfl:   Allergies: No Known Allergies  Past Medical History, Surgical history, Social history, and Family History were reviewed and updated.  Review of Systems: Review of Systems  Constitutional: Negative.   HENT:  Negative.    Eyes: Negative.   Respiratory: Negative.    Cardiovascular: Negative.   Gastrointestinal: Negative.   Endocrine: Negative.   Genitourinary: Negative.    Musculoskeletal: Negative.   Skin: Negative.   Neurological: Negative.   Hematological: Negative.   Psychiatric/Behavioral: Negative.     Physical Exam:  weight is 187 lb (84.8 kg). His oral temperature is 97.9 F (36.6 C). His blood pressure is 102/68 and his pulse is 58 (abnormal). His respiration is 20 and oxygen saturation is 97%.   Wt Readings from Last 3 Encounters:  09/21/21 187 lb (84.8 kg)  08/07/21 182 lb (82.6 kg)  05/07/21 181 lb 1.9 oz (82.2 kg)    Physical Exam Vitals reviewed.  HENT:     Head: Normocephalic and atraumatic.  Eyes:     Pupils: Pupils are equal, round, and reactive to light.  Cardiovascular:     Rate and Rhythm: Normal rate and regular rhythm.     Heart sounds: Normal heart sounds.  Pulmonary:     Effort: Pulmonary effort is normal.     Breath sounds: Normal breath sounds.  Abdominal:     General: Bowel sounds are normal.     Palpations: Abdomen is soft.  Musculoskeletal:        General: No tenderness or deformity. Normal range of motion.     Cervical back: Normal range of motion.  Lymphadenopathy:     Cervical: No cervical adenopathy.  Skin:    General: Skin is warm and dry.     Findings: No erythema or rash.  Neurological:     Mental Status: He is alert and oriented to person, place, and time.  Psychiatric:        Behavior: Behavior normal.        Thought Content: Thought content normal.        Judgment: Judgment normal.   Lab Results  Component Value Date   WBC 4.7 09/21/2021    HGB 12.5 (L) 09/21/2021   HCT 35.9 (L) 09/21/2021   MCV 97.0 09/21/2021   PLT 95 (L) 09/21/2021     Chemistry      Component Value Date/Time   NA 138 09/21/2021 0920   NA 142 10/15/2019 1500   K 4.3 09/21/2021 0920   CL 102 09/21/2021 0920   CO2 31 09/21/2021  0920   BUN 24 (H) 09/21/2021 0920   BUN 25 10/15/2019 1500   CREATININE 1.20 09/21/2021 0920      Component Value Date/Time   CALCIUM 8.8 (L) 09/21/2021 0920   ALKPHOS 84 09/21/2021 0920   AST 20 09/21/2021 0920   ALT 12 09/21/2021 0920   BILITOT 0.7 09/21/2021 0920      Impression and Plan: Mr. Fermin is a very nice 81 year old white male.  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  We will go ahead with a second cycle of Velcade/Decadron.  Hopefully, we will see that his light chains have come down nicely.  Again, his quality of life is good.  I want to make sure that this is still our main focus.  I forgot to mention that we did do a CT of his chest back in January.  This was because of a lingular nodule that was seen previously.  This was no longer present on the CT scan.  We will go with his second cycle of treatment.  I will then plan to get him back in 1 more month.  We will see how he is doing.  [name], MD 2/10/202310:49 AM      START ON PATHWAY REGIMEN - Multiple Myeloma and Other Plasma Cell Dyscrasias     A cycle is every 21 days:     Bortezomib      Lenalidomide      Dexamethasone   **Always confirm dose/schedule in your pharmacy ordering system**  Patient Characteristics: Multiple Myeloma, Newly Diagnosed, Transplant Ineligible or Refused, Standard Risk Disease Classification: Multiple Myeloma R-ISS Staging: III Therapeutic Status: Newly Diagnosed Is Patient Eligible for Transplant<= Transplant Ineligible or Refused Risk Status: Standard Risk Intent of Therapy: Curative Intent, Discussed with Patient

## 2021-09-21 NOTE — Patient Instructions (Signed)
[facility]  Discharge Instructions: Thank you for choosing [facility] to provide your oncology and hematology care.   If you have a lab appointment with the [facility], please go directly to the [facility] and check in at the registration area.  Wear comfortable clothing and clothing appropriate for easy access to any Portacath or PICC line.   We strive to give you quality time with your provider. You may need to reschedule your appointment if you arrive late (15 or more minutes).  Arriving late affects you and other patients whose appointments are after yours.  Also, if you miss three or more appointments without notifying the office, you may be dismissed from the clinic at the providers discretion.      For prescription refill requests, have your pharmacy contact our office and allow 72 hours for refills to be completed.    Today you received the following chemotherapy and/or immunotherapy agents Velcade      To help prevent nausea and vomiting after your treatment, we encourage you to take your nausea medication as directed.  BELOW ARE SYMPTOMS THAT SHOULD BE REPORTED IMMEDIATELY: *FEVER GREATER THAN 100.4 F (38 C) OR HIGHER *CHILLS OR SWEATING *NAUSEA AND VOMITING THAT IS NOT CONTROLLED WITH YOUR NAUSEA MEDICATION *UNUSUAL SHORTNESS OF BREATH *UNUSUAL BRUISING OR BLEEDING *URINARY PROBLEMS (pain or burning when urinating, or frequent urination) *BOWEL PROBLEMS (unusual diarrhea, constipation, pain near the anus) TENDERNESS IN MOUTH AND THROAT WITH OR WITHOUT PRESENCE OF ULCERS (sore throat, sores in mouth, or a toothache) UNUSUAL RASH, SWELLING OR PAIN  UNUSUAL VAGINAL DISCHARGE OR ITCHING   Items with * indicate a potential emergency and should be followed up as soon as possible or go to the Emergency Department if any problems should occur.  Please show the CHEMOTHERAPY ALERT CARD or IMMUNOTHERAPY ALERT CARD at check-in to the  Emergency Department and triage nurse. Should you have questions after your visit or need to cancel or reschedule your appointment, please contact [facility]  [phone] and follow the prompts.  Office hours are 8:00 a.m. to 4:30 p.m. Monday - Friday. Please note that voicemails left after 4:00 p.m. may not be returned until the following business day.  We are closed weekends and major holidays. You have access to a nurse at all times for urgent questions. Please call the main number to the clinic [phone] and follow the prompts.  For any non-urgent questions, you may also contact your provider using MyChart. We now offer e-Visits for anyone 54 and older to request care online for non-urgent symptoms. For details visit [email or web address].   Also download the MyChart app! Go to the app store, search "MyChart", open the app, select [facility], and log in with your MyChart username and password.  Due to Covid, a mask is required upon entering the hospital/clinic. If you do not have a mask, one will be given to you upon arrival. For doctor visits, patients may have 1 support person aged 17 or older with them. For treatment visits, patients cannot have anyone with them due to current Covid guidelines and our immunocompromised population.

## 2021-09-21 NOTE — Progress Notes (Signed)
Ok to treat with platelets of 95 per Dr Ennever. dph 

## 2021-09-22 LAB — IGG, IGA, IGM
IgA: 156 mg/dL (ref 61–437)
IgG (Immunoglobin G), Serum: 1061 mg/dL (ref 603–1613)
IgM (Immunoglobulin M), Srm: 58 mg/dL (ref 15–143)

## 2021-09-24 ENCOUNTER — Telehealth: Payer: Self-pay

## 2021-09-24 LAB — KAPPA/LAMBDA LIGHT CHAINS
Kappa free light chain: 674.8 mg/L — ABNORMAL HIGH (ref 3.3–19.4)
Kappa, lambda light chain ratio: 23.68 — ABNORMAL HIGH (ref 0.26–1.65)
Lambda free light chains: 28.5 mg/L — ABNORMAL HIGH (ref 5.7–26.3)

## 2021-09-24 NOTE — Telephone Encounter (Signed)
Called pt per Dr. Antonieta Pert request to let him know that his kappa light chain came down by 100 points. Pt appreciative of call and verbalized understanding.   Reviewed pt next appointment and time and pt aware to call with any questions or concerns. Pt had no further questions.

## 2021-09-24 NOTE — Telephone Encounter (Signed)
-----   Message from [name], MD sent at 09/24/2021  3:58 PM EST ----- Call - the kappa light chain came down by 100 points!!  Laurey Arrow

## 2021-09-26 LAB — IMMUNOFIXATION REFLEX, SERUM
IgA: 161 mg/dL (ref 61–437)
IgG (Immunoglobin G), Serum: 1077 mg/dL (ref 603–1613)
IgM (Immunoglobulin M), Srm: 62 mg/dL (ref 15–143)

## 2021-09-26 LAB — PROTEIN ELECTROPHORESIS, SERUM, WITH REFLEX
A/G Ratio: 1.5 (ref 0.7–1.7)
Albumin ELP: 3.8 g/dL (ref 2.9–4.4)
Alpha-1-Globulin: 0.2 g/dL (ref 0.0–0.4)
Alpha-2-Globulin: 0.4 g/dL (ref 0.4–1.0)
Beta Globulin: 0.8 g/dL (ref 0.7–1.3)
Gamma Globulin: 1 g/dL (ref 0.4–1.8)
Globulin, Total: 2.5 g/dL (ref 2.2–3.9)
M-Spike, %: 0.4 g/dL — ABNORMAL HIGH
SPEP Interpretation: 0
Total Protein ELP: 6.3 g/dL (ref 6.0–8.5)

## 2021-09-28 ENCOUNTER — Inpatient Hospital Stay: Payer: Medicare PPO

## 2021-09-28 ENCOUNTER — Other Ambulatory Visit: Payer: Self-pay

## 2021-09-28 VITALS — BP 119/70 | HR 58 | Temp 97.2°F | Resp 19

## 2021-09-28 DIAGNOSIS — Z5111 Encounter for antineoplastic chemotherapy: Secondary | ICD-10-CM | POA: Diagnosis not present

## 2021-09-28 DIAGNOSIS — D472 Monoclonal gammopathy: Secondary | ICD-10-CM

## 2021-09-28 LAB — COMPREHENSIVE METABOLIC PANEL
ALT: 15 U/L (ref 0–44)
AST: 22 U/L (ref 15–41)
Albumin: 3.8 g/dL (ref 3.5–5.0)
Alkaline Phosphatase: 82 U/L (ref 38–126)
Anion gap: 4 — ABNORMAL LOW (ref 5–15)
BUN: 22 mg/dL (ref 8–23)
CO2: 31 mmol/L (ref 22–32)
Calcium: 8.5 mg/dL — ABNORMAL LOW (ref 8.9–10.3)
Chloride: 103 mmol/L (ref 98–111)
Creatinine, Ser: 1.13 mg/dL (ref 0.61–1.24)
GFR, Estimated: >60 mL/min (ref >60)
Glucose, Bld: 151 mg/dL — ABNORMAL HIGH (ref 70–99)
Potassium: 4.4 mmol/L (ref 3.5–5.1)
Sodium: 138 mmol/L (ref 135–145)
Total Bilirubin: 0.8 mg/dL (ref 0.3–1.2)
Total Protein: 6.4 g/dL — ABNORMAL LOW (ref 6.5–8.1)

## 2021-09-28 LAB — CBC WITH DIFFERENTIAL/PLATELET
Abs Immature Granulocytes: 0.05 10*3/uL (ref 0.00–0.07)
Basophils Absolute: 0 10*3/uL (ref 0.0–0.1)
Basophils Relative: 1 %
Eosinophils Absolute: 0.3 10*3/uL (ref 0.0–0.5)
Eosinophils Relative: 8 %
HCT: 35.2 % — ABNORMAL LOW (ref 39.0–52.0)
Hemoglobin: 12.2 g/dL — ABNORMAL LOW (ref 13.0–17.0)
Immature Granulocytes: 1 %
Lymphocytes Relative: 25 %
Lymphs Abs: 0.9 10*3/uL (ref 0.7–4.0)
MCH: 33.7 pg (ref 26.0–34.0)
MCHC: 34.7 g/dL (ref 30.0–36.0)
MCV: 97.2 fL (ref 80.0–100.0)
Monocytes Absolute: 0.3 10*3/uL (ref 0.1–1.0)
Monocytes Relative: 7 %
Neutro Abs: 2 10*3/uL (ref 1.7–7.7)
Neutrophils Relative %: 58 %
Platelets: 80 10*3/uL — ABNORMAL LOW (ref 150–400)
RBC: 3.62 MIL/uL — ABNORMAL LOW (ref 4.22–5.81)
RDW: 13.1 % (ref 11.5–15.5)
WBC: 3.5 10*3/uL — ABNORMAL LOW (ref 4.0–10.5)
nRBC: 0 % (ref 0.0–0.2)

## 2021-09-28 MED ORDER — BORTEZOMIB CHEMO SQ INJECTION 3.5 MG (2.5MG/ML)
1.3000 mg/m2 | Freq: Once | INTRAMUSCULAR | Status: AC
  Administered 2021-09-28: 2.5 mg via SUBCUTANEOUS
  Filled 2021-09-28: qty 1

## 2021-09-28 MED ORDER — DEXAMETHASONE 4 MG PO TABS
20.0000 mg | ORAL_TABLET | Freq: Once | ORAL | Status: AC
  Administered 2021-09-28: 20 mg via ORAL
  Filled 2021-09-28: qty 5

## 2021-09-28 NOTE — Patient Instructions (Signed)
[facility]  Discharge Instructions: Thank you for choosing [facility] to provide your oncology and hematology care.   If you have a lab appointment with the [facility], please go directly to the [facility] and check in at the registration area.  Wear comfortable clothing and clothing appropriate for easy access to any Portacath or PICC line.   We strive to give you quality time with your provider. You may need to reschedule your appointment if you arrive late (15 or more minutes).  Arriving late affects you and other patients whose appointments are after yours.  Also, if you miss three or more appointments without notifying the office, you may be dismissed from the clinic at the providers discretion.      For prescription refill requests, have your pharmacy contact our office and allow 72 hours for refills to be completed.    Today you received the following chemotherapy and/or immunotherapy agents Velcade      To help prevent nausea and vomiting after your treatment, we encourage you to take your nausea medication as directed.  BELOW ARE SYMPTOMS THAT SHOULD BE REPORTED IMMEDIATELY: *FEVER GREATER THAN 100.4 F (38 C) OR HIGHER *CHILLS OR SWEATING *NAUSEA AND VOMITING THAT IS NOT CONTROLLED WITH YOUR NAUSEA MEDICATION *UNUSUAL SHORTNESS OF BREATH *UNUSUAL BRUISING OR BLEEDING *URINARY PROBLEMS (pain or burning when urinating, or frequent urination) *BOWEL PROBLEMS (unusual diarrhea, constipation, pain near the anus) TENDERNESS IN MOUTH AND THROAT WITH OR WITHOUT PRESENCE OF ULCERS (sore throat, sores in mouth, or a toothache) UNUSUAL RASH, SWELLING OR PAIN  UNUSUAL VAGINAL DISCHARGE OR ITCHING   Items with * indicate a potential emergency and should be followed up as soon as possible or go to the Emergency Department if any problems should occur.  Please show the CHEMOTHERAPY ALERT CARD or IMMUNOTHERAPY ALERT CARD at check-in to the  Emergency Department and triage nurse. Should you have questions after your visit or need to cancel or reschedule your appointment, please contact [facility]  [phone] and follow the prompts.  Office hours are 8:00 a.m. to 4:30 p.m. Monday - Friday. Please note that voicemails left after 4:00 p.m. may not be returned until the following business day.  We are closed weekends and major holidays. You have access to a nurse at all times for urgent questions. Please call the main number to the clinic [phone] and follow the prompts.  For any non-urgent questions, you may also contact your provider using MyChart. We now offer e-Visits for anyone 4 and older to request care online for non-urgent symptoms. For details visit [email or web address].   Also download the MyChart app! Go to the app store, search "MyChart", open the app, select [facility], and log in with your MyChart username and password.  Due to Covid, a mask is required upon entering the hospital/clinic. If you do not have a mask, one will be given to you upon arrival. For doctor visits, patients may have 1 support person aged 53 or older with them. For treatment visits, patients cannot have anyone with them due to current Covid guidelines and our immunocompromised population.

## 2021-09-28 NOTE — Progress Notes (Signed)
Ok to treat with platelets of 80 per Dr Marin Olp. dph

## 2021-10-05 ENCOUNTER — Other Ambulatory Visit: Payer: Self-pay

## 2021-10-05 ENCOUNTER — Inpatient Hospital Stay: Payer: Medicare PPO

## 2021-10-05 VITALS — BP 120/70 | HR 55 | Temp 97.7°F | Resp 18

## 2021-10-05 DIAGNOSIS — D472 Monoclonal gammopathy: Secondary | ICD-10-CM

## 2021-10-05 DIAGNOSIS — Z5111 Encounter for antineoplastic chemotherapy: Secondary | ICD-10-CM | POA: Diagnosis not present

## 2021-10-05 LAB — CBC WITH DIFFERENTIAL (CANCER CENTER ONLY)
Abs Immature Granulocytes: 0.08 10*3/uL — ABNORMAL HIGH (ref 0.00–0.07)
Basophils Absolute: 0 10*3/uL (ref 0.0–0.1)
Basophils Relative: 0 %
Eosinophils Absolute: 0.2 10*3/uL (ref 0.0–0.5)
Eosinophils Relative: 5 %
HCT: 36.1 % — ABNORMAL LOW (ref 39.0–52.0)
Hemoglobin: 12.7 g/dL — ABNORMAL LOW (ref 13.0–17.0)
Immature Granulocytes: 2 %
Lymphocytes Relative: 22 %
Lymphs Abs: 1 10*3/uL (ref 0.7–4.0)
MCH: 33.9 pg (ref 26.0–34.0)
MCHC: 35.2 g/dL (ref 30.0–36.0)
MCV: 96.3 fL (ref 80.0–100.0)
Monocytes Absolute: 0.4 10*3/uL (ref 0.1–1.0)
Monocytes Relative: 7 %
Neutro Abs: 3 10*3/uL (ref 1.7–7.7)
Neutrophils Relative %: 64 %
Platelet Count: 81 10*3/uL — ABNORMAL LOW (ref 150–400)
RBC: 3.75 MIL/uL — ABNORMAL LOW (ref 4.22–5.81)
RDW: 13 % (ref 11.5–15.5)
WBC Count: 4.7 10*3/uL (ref 4.0–10.5)
nRBC: 0 % (ref 0.0–0.2)

## 2021-10-05 LAB — CMP (CANCER CENTER ONLY)
ALT: 14 U/L (ref 0–44)
AST: 19 U/L (ref 15–41)
Albumin: 4 g/dL (ref 3.5–5.0)
Alkaline Phosphatase: 74 U/L (ref 38–126)
Anion gap: 3 — ABNORMAL LOW (ref 5–15)
BUN: 27 mg/dL — ABNORMAL HIGH (ref 8–23)
CO2: 32 mmol/L (ref 22–32)
Calcium: 8.8 mg/dL — ABNORMAL LOW (ref 8.9–10.3)
Chloride: 104 mmol/L (ref 98–111)
Creatinine: 1.18 mg/dL (ref 0.61–1.24)
GFR, Estimated: >60 mL/min (ref >60)
Glucose, Bld: 113 mg/dL — ABNORMAL HIGH (ref 70–99)
Potassium: 4.3 mmol/L (ref 3.5–5.1)
Sodium: 139 mmol/L (ref 135–145)
Total Bilirubin: 0.9 mg/dL (ref 0.3–1.2)
Total Protein: 6.4 g/dL — ABNORMAL LOW (ref 6.5–8.1)

## 2021-10-05 MED ORDER — DEXAMETHASONE 4 MG PO TABS
20.0000 mg | ORAL_TABLET | Freq: Once | ORAL | Status: AC
  Administered 2021-10-05: 20 mg via ORAL
  Filled 2021-10-05: qty 5

## 2021-10-05 MED ORDER — BORTEZOMIB CHEMO SQ INJECTION 3.5 MG (2.5MG/ML)
1.3000 mg/m2 | Freq: Once | INTRAMUSCULAR | Status: AC
  Administered 2021-10-05: 2.5 mg via SUBCUTANEOUS
  Filled 2021-10-05: qty 1

## 2021-10-05 NOTE — Patient Instructions (Signed)
Bortezomib injection What is this medication? BORTEZOMIB (bor TEZ oh mib) targets proteins in cancer cells and stops thecancer cells from growing. It treats multiple myeloma and mantle cell lymphoma. This medicine may be used for other purposes; ask your health care provider orpharmacist if you have questions. COMMON BRAND NAME(S): Velcade What should I tell my care team before I take this medication? They need to know if you have any of these conditions: dehydration diabetes (high blood sugar) heart disease liver disease tingling of the fingers or toes or other nerve disorder an unusual or allergic reaction to bortezomib, mannitol, boron, other medicines, foods, dyes, or preservatives pregnant or trying to get pregnant breast-feeding How should I use this medication? This medicine is injected into a vein or under the skin. It is given by ahealth care provider in a hospital or clinic setting. Talk to your health care provider about the use of this medicine in children.Special care may be needed. Overdosage: If you think you have taken too much of this medicine contact apoison control center or emergency room at once. NOTE: This medicine is only for you. Do not share this medicine with others. What if I miss a dose? Keep appointments for follow-up doses. It is important not to miss your dose.Call your health care provider if you are unable to keep an appointment. What may interact with this medication? This medicine may interact with the following medications: ketoconazole rifampin This list may not describe all possible interactions. Give your health care provider a list of all the medicines, herbs, non-prescription drugs, or dietary supplements you use. Also tell them if you smoke, drink alcohol, or use illegaldrugs. Some items may interact with your medicine. What should I watch for while using this medication? Your condition will be monitored carefully while you are receiving  thismedicine. You may need blood work done while you are taking this medicine. You may get drowsy or dizzy. Do not drive, use machinery, or do anything that needs mental alertness until you know how this medicine affects you. Do not stand up or sit up quickly, especially if you are an older patient. Thisreduces the risk of dizzy or fainting spells This medicine may increase your risk of getting an infection. Call your health care provider for advice if you get a fever, chills, sore throat, or other symptoms of a cold or flu. Do not treat yourself. Try to avoid being aroundpeople who are sick. Check with your health care provider if you have severe diarrhea, nausea, and vomiting, or if you sweat a lot. The loss of too much body fluid may make itdangerous for you to take this medicine. Do not become pregnant while taking this medicine or for 7 months after stopping it. Women should inform their health care provider if they wish to become pregnant or think they might be pregnant. Men should not father a child while taking this medicine and for 4 months after stopping it. There is a potential for serious harm to an unborn child. Talk to your health care provider for more information. Do not breast-feed an infant while taking thismedicine or for 2 months after stopping it. This medicine may make it more difficult to get pregnant or father a child.Talk to your health care provider if you are concerned about your fertility. What side effects may I notice from receiving this medication? Side effects that you should report to your doctor or health care professionalas soon as possible: allergic reactions (skin rash; itching or hives; swelling   care professional as soon as possible: ?allergic reactions (skin rash; itching or hives; swelling of the face, lips, or tongue) ?bleeding (bloody or black, tarry stools; red or dark brown urine; spitting up blood or brown material that looks like coffee grounds; red spots on the skin; unusual bruising or bleeding from the eye, gums, or nose) ?blurred vision or changes  in vision ?confusion ?constipation ?headache ?heart failure (trouble breathing; fast, irregular heartbeat; sudden weight gain; swelling of the ankles, feet, hands) ?infection (fever, chills, cough, sore throat, pain or trouble passing urine) ?lack or loss of appetite ?liver injury (dark yellow or brown urine; general ill feeling or flu-like symptoms; loss of appetite, right upper belly pain; yellowing of the eyes or skin) ?low blood pressure (dizziness; feeling faint or lightheaded, falls; unusually weak or tired) ?muscle cramps ?pain, redness, or irritation at site where injected ?pain, tingling, numbness in the hands or feet ?seizures ?trouble breathing ?unusual bruising or bleeding ?Side effects that usually do not require medical attention (report to your doctor or health care professional if they continue or are bothersome): ?diarrhea ?nausea ?stomach pain ?trouble sleeping ?vomiting ?This list may not describe all possible side effects. Call your doctor for medical advice about side effects. You may report side effects to FDA at 1-800-FDA-1088. ?Where should I keep my medication? ?This medicine is given in a hospital or clinic. It will not be stored at home. ?NOTE: This sheet is a summary. It may not cover all possible information. If you have questions about this medicine, talk to your doctor, pharmacist, or health care provider. ?? 2022 [employer]vier/Gold Standard (2020-07-20 00:00:00) ?

## 2021-10-19 ENCOUNTER — Encounter: Payer: Self-pay | Admitting: Hematology & Oncology

## 2021-10-19 ENCOUNTER — Inpatient Hospital Stay: Payer: Medicare PPO | Attending: Hematology & Oncology

## 2021-10-19 ENCOUNTER — Inpatient Hospital Stay: Payer: Medicare PPO | Admitting: Hematology & Oncology

## 2021-10-19 ENCOUNTER — Inpatient Hospital Stay: Payer: Medicare PPO

## 2021-10-19 ENCOUNTER — Other Ambulatory Visit: Payer: Self-pay

## 2021-10-19 VITALS — BP 104/66 | HR 56 | Temp 97.5°F | Resp 20 | Wt 188.0 lb

## 2021-10-19 DIAGNOSIS — C9000 Multiple myeloma not having achieved remission: Secondary | ICD-10-CM

## 2021-10-19 DIAGNOSIS — Z5111 Encounter for antineoplastic chemotherapy: Secondary | ICD-10-CM | POA: Diagnosis present

## 2021-10-19 DIAGNOSIS — D472 Monoclonal gammopathy: Secondary | ICD-10-CM

## 2021-10-19 LAB — CBC WITH DIFFERENTIAL (CANCER CENTER ONLY)
Abs Immature Granulocytes: 0.02 10*3/uL (ref 0.00–0.07)
Basophils Absolute: 0 10*3/uL (ref 0.0–0.1)
Basophils Relative: 1 %
Eosinophils Absolute: 0.3 10*3/uL (ref 0.0–0.5)
Eosinophils Relative: 7 %
HCT: 34.9 % — ABNORMAL LOW (ref 39.0–52.0)
Hemoglobin: 12.3 g/dL — ABNORMAL LOW (ref 13.0–17.0)
Immature Granulocytes: 1 %
Lymphocytes Relative: 24 %
Lymphs Abs: 0.9 10*3/uL (ref 0.7–4.0)
MCH: 34.3 pg — ABNORMAL HIGH (ref 26.0–34.0)
MCHC: 35.2 g/dL (ref 30.0–36.0)
MCV: 97.2 fL (ref 80.0–100.0)
Monocytes Absolute: 0.2 10*3/uL (ref 0.1–1.0)
Monocytes Relative: 6 %
Neutro Abs: 2.4 10*3/uL (ref 1.7–7.7)
Neutrophils Relative %: 61 %
Platelet Count: 84 10*3/uL — ABNORMAL LOW (ref 150–400)
RBC: 3.59 MIL/uL — ABNORMAL LOW (ref 4.22–5.81)
RDW: 13.2 % (ref 11.5–15.5)
WBC Count: 3.9 10*3/uL — ABNORMAL LOW (ref 4.0–10.5)
nRBC: 0 % (ref 0.0–0.2)

## 2021-10-19 LAB — CMP (CANCER CENTER ONLY)
ALT: 13 U/L (ref 0–44)
AST: 20 U/L (ref 15–41)
Albumin: 3.8 g/dL (ref 3.5–5.0)
Alkaline Phosphatase: 78 U/L (ref 38–126)
Anion gap: 6 (ref 5–15)
BUN: 26 mg/dL — ABNORMAL HIGH (ref 8–23)
CO2: 30 mmol/L (ref 22–32)
Calcium: 8.7 mg/dL — ABNORMAL LOW (ref 8.9–10.3)
Chloride: 104 mmol/L (ref 98–111)
Creatinine: 1.15 mg/dL (ref 0.61–1.24)
GFR, Estimated: >60 mL/min (ref >60)
Glucose, Bld: 185 mg/dL — ABNORMAL HIGH (ref 70–99)
Potassium: 4.6 mmol/L (ref 3.5–5.1)
Sodium: 140 mmol/L (ref 135–145)
Total Bilirubin: 0.7 mg/dL (ref 0.3–1.2)
Total Protein: 6.1 g/dL — ABNORMAL LOW (ref 6.5–8.1)

## 2021-10-19 LAB — LACTATE DEHYDROGENASE: LDH: 245 U/L — ABNORMAL HIGH (ref 98–192)

## 2021-10-19 MED ORDER — BORTEZOMIB CHEMO SQ INJECTION 3.5 MG (2.5MG/ML)
1.3000 mg/m2 | Freq: Once | INTRAMUSCULAR | Status: AC
  Administered 2021-10-19: 2.5 mg via SUBCUTANEOUS
  Filled 2021-10-19: qty 1

## 2021-10-19 MED ORDER — DEXAMETHASONE 4 MG PO TABS
20.0000 mg | ORAL_TABLET | Freq: Once | ORAL | Status: AC
  Administered 2021-10-19: 20 mg via ORAL
  Filled 2021-10-19: qty 5

## 2021-10-19 NOTE — Patient Instructions (Signed)
[facility]  Discharge Instructions: ?Thank you for choosing [facility] to provide your oncology and hematology care.  ? ?If you have a lab appointment with the [facility], please go directly to the [facility] and check in at the registration area. ? ?Wear comfortable clothing and clothing appropriate for easy access to any Portacath or PICC line.  ? ?We strive to give you quality time with your provider. You may need to reschedule your appointment if you arrive late (15 or more minutes).  Arriving late affects you and other patients whose appointments are after yours.  Also, if you miss three or more appointments without notifying the office, you may be dismissed from the clinic at the provider?s discretion.    ?  ?For prescription refill requests, have your pharmacy contact our office and allow 72 hours for refills to be completed.   ? ?Today you received the following chemotherapy and/or immunotherapy agents Vidaza    ?  ?To help prevent nausea and vomiting after your treatment, we encourage you to take your nausea medication as directed. ? ?BELOW ARE SYMPTOMS THAT SHOULD BE REPORTED IMMEDIATELY: ?*FEVER GREATER THAN 100.4 F (38 ?C) OR HIGHER ?*CHILLS OR SWEATING ?*NAUSEA AND VOMITING THAT IS NOT CONTROLLED WITH YOUR NAUSEA MEDICATION ?*UNUSUAL SHORTNESS OF BREATH ?*UNUSUAL BRUISING OR BLEEDING ?*URINARY PROBLEMS (pain or burning when urinating, or frequent urination) ?*BOWEL PROBLEMS (unusual diarrhea, constipation, pain near the anus) ?TENDERNESS IN MOUTH AND THROAT WITH OR WITHOUT PRESENCE OF ULCERS (sore throat, sores in mouth, or a toothache) ?UNUSUAL RASH, SWELLING OR PAIN  ?UNUSUAL VAGINAL DISCHARGE OR ITCHING  ? ?Items with * indicate a potential emergency and should be followed up as soon as possible or go to the Emergency Department if any problems should occur. ? ?Please show the CHEMOTHERAPY ALERT CARD or IMMUNOTHERAPY ALERT CARD at check-in to the  Emergency Department and triage nurse. ?Should you have questions after your visit or need to cancel or reschedule your appointment, please contact [facility]  [phone] and follow the prompts.  Office hours are 8:00 a.m. to 4:30 p.m. Monday - Friday. Please note that voicemails left after 4:00 p.m. may not be returned until the following business day.  We are closed weekends and major holidays. You have access to a nurse at all times for urgent questions. Please call the main number to the clinic [phone] and follow the prompts. ? ?For any non-urgent questions, you may also contact your provider using MyChart. We now offer e-Visits for anyone 29 and older to request care online for non-urgent symptoms. For details visit [email or web address]. ?  ?Also download the MyChart app! Go to the app store, search "MyChart", open the app, select [facility], and log in with your MyChart username and password. ? ?Due to Covid, a mask is required upon entering the hospital/clinic. If you do not have a mask, one will be given to you upon arrival. For doctor visits, patients may have 1 support person aged 52 or older with them. For treatment visits, patients cannot have anyone with them due to current Covid guidelines and our immunocompromised population.  ?

## 2021-10-19 NOTE — Progress Notes (Signed)
Ok to treat with platelet count of 84 per order of Dr. Marin Olp ?

## 2021-10-19 NOTE — Progress Notes (Signed)
?Hematology and Oncology Follow Up Visit ? ?Riley Eaton ?[id number] ?1940-09-20 81 y.o. ?10/19/2021 ? ? 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 ?Current Therapy:   ?Velcade/Decadron -- ( 3 wk on/1 wk off) -- s/p cycle #2 -- start on 08/24/2021 ?    ?Interim History:  Riley Eaton is back for follow-up.  He is responding to treatment.  His IgG level and kappa light chain levels are both come down.  His last IgG level was down to 1080 mg/dL.  The last kappa light chain was down to 650 mg/dL. ? ?He has had no problems with treatment.  He has had no nausea or vomiting.  His blood sugar is up a little bit today. ? ?He has had no diarrhea.  He has had no nausea or vomiting.  He has had no rashes.  There is been no leg swelling. ? ?He has chronic back pain.  This is not gotten worse. ? ?He has had no cough.  He has had no fever.  He has had no bleeding. ? ?Overall, I would say his performance status is probably ECOG 2.   ? ?Medications:  ?Current Outpatient Medications:  ?  acetaminophen (TYLENOL) 325 MG tablet, Take 2 tablets (650 mg total) by mouth every 6 (six) hours as needed for mild pain (or temp > 100)., Disp: 20 tablet, Rfl: 0 ?  acyclovir (ZOVIRAX) 400 MG tablet, Take 1 tablet (400 mg total) by mouth 2 (two) times daily., Disp: 60 tablet, Rfl: 3 ?  allopurinol (ZYLOPRIM) 100 MG tablet, TAKE 1 TABLET BY MOUTH EVERY DAY (Patient taking differently: Take 100 mg by mouth daily.), Disp: 90 tablet, Rfl: 1 ?  aspirin 81 MG chewable tablet, Chew 1 tablet (81 mg total) by mouth daily., Disp: 30 tablet, Rfl: 0 ?  atorvastatin (LIPITOR) 40 MG tablet, Take 1 tablet (40 mg total) by mouth daily at 6 PM. (Patient taking differently: Take 40 mg by mouth at bedtime.), Disp: 30 tablet, Rfl: 0 ?  carvedilol (COREG) 6.25 MG tablet, Take 1 tablet by mouth 2 (two) times daily., Disp: , Rfl:  ?  colchicine 0.6 MG tablet, , Disp: , Rfl:  ?  diphenhydramine-acetaminophen (TYLENOL PM) 25-500 MG TABS tablet, Take  1 tablet by mouth at bedtime., Disp: , Rfl:  ?  doxycycline (VIBRAMYCIN) 100 MG capsule, Take by mouth., Disp: , Rfl:  ?  ferrous sulfate 325 (65 FE) MG EC tablet, Take 325 mg by mouth daily., Disp: , Rfl:  ?  finasteride (PROSCAR) 5 MG tablet, Take 1 tablet (5 mg total) by mouth daily. For urination, Disp: 90 tablet, Rfl: 3 ?  furosemide (LASIX) 40 MG tablet, TAKE 1 TABLET BY MOUTH EVERY DAY, Disp: 90 tablet, Rfl: 3 ?  gabapentin (NEURONTIN) 300 MG capsule, TAKE 2 CAPSULES BY MOUTH THREE TIMES A DAY, Disp: , Rfl:  ?  HYDROcodone-acetaminophen (NORCO/VICODIN) 5-325 MG tablet, Take 1 tablet by mouth 4 (four) times daily., Disp: , Rfl:  ?  lidocaine-prilocaine (EMLA) cream, Apply to affected area once, Disp: 30 g, Rfl: 3 ?  LORazepam (ATIVAN) 0.5 MG tablet, Take 1 tablet (0.5 mg total) by mouth every 6 (six) hours as needed (Nausea or vomiting)., Disp: 30 tablet, Rfl: 0 ?  losartan (COZAAR) 25 MG tablet, Take 25 mg by mouth daily., Disp: , Rfl:  ?  MOVANTIK 25 MG TABS tablet, Take 25 mg by mouth daily., Disp: , Rfl:  ?  OLANZapine (ZYPREXA) 5 MG tablet, Take  1 tablet (5 mg total) by mouth daily., Disp: 30 tablet, Rfl: 0 ?  ondansetron (ZOFRAN ODT) 4 MG disintegrating tablet, Take 1 tablet (4 mg total) by mouth every 8 (eight) hours as needed for nausea or vomiting., Disp: 20 tablet, Rfl: 0 ?  ondansetron (ZOFRAN) 8 MG tablet, Take 1 tablet (8 mg total) by mouth 2 (two) times daily as needed (Nausea or vomiting)., Disp: 30 tablet, Rfl: 1 ?  pantoprazole (PROTONIX) 40 MG tablet, Take 1 tablet (40 mg total) by mouth at bedtime. (Patient taking differently: Take 40 mg by mouth daily.), Disp: 30 tablet, Rfl: 0 ?  polyethylene glycol (MIRALAX / GLYCOLAX) 17 g packet, Take 17 g by mouth daily. (Patient taking differently: Take 17 g by mouth daily as needed (constipation).), Disp: 14 each, Rfl: 0 ?  pregabalin (LYRICA) 150 MG capsule, Take 150 mg by mouth 2 (two) times daily., Disp: , Rfl:  ?  prochlorperazine (COMPAZINE) 10  MG tablet, Take 1 tablet (10 mg total) by mouth every 6 (six) hours as needed (Nausea or vomiting)., Disp: 30 tablet, Rfl: 1 ?  traZODone (DESYREL) 50 MG tablet, Take 50 mg by mouth at bedtime., Disp: , Rfl:  ?  vitamin B-12 (CYANOCOBALAMIN) 500 MCG tablet, Take 500 mcg by mouth daily., Disp: , Rfl:  ?No current facility-administered medications for this visit. ? ?Facility-Administered Medications Ordered in Other Visits:  ?  bortezomib SQ (VELCADE) chemo injection (2.'5mg'$ /mL concentration) 2.5 mg, 1.3 mg/m2 (Treatment Plan Recorded), Subcutaneous, Once, Riley Eaton, Riley Cobb, MD ? ?Allergies: No Known Allergies ? ?Past Medical History, Surgical history, Social history, and Family History were reviewed and updated. ? ?Review of Systems: ?Review of Systems  ?Constitutional: Negative.   ?HENT:  Negative.    ?Eyes: Negative.   ?Respiratory: Negative.    ?Cardiovascular: Negative.   ?Gastrointestinal: Negative.   ?Endocrine: Negative.   ?Genitourinary: Negative.    ?Musculoskeletal: Negative.   ?Skin: Negative.   ?Neurological: Negative.   ?Hematological: Negative.   ?Psychiatric/Behavioral: Negative.    ? ?Physical Exam: ? weight is 188 lb (85.3 kg). His oral temperature is 97.5 ?F (36.4 ?C) (abnormal). His blood pressure is 104/66 and his pulse is 56 (abnormal). His respiration is 20 and oxygen saturation is 97%.  ? ?Wt Readings from Last 3 Encounters:  ?10/19/21 188 lb (85.3 kg)  ?09/21/21 187 lb (84.8 kg)  ?08/07/21 182 lb (82.6 kg)  ? ? ?Physical Exam ?Vitals reviewed.  ?HENT:  ?   Head: Normocephalic and atraumatic.  ?Eyes:  ?   Pupils: Pupils are equal, round, and reactive to light.  ?Cardiovascular:  ?   Rate and Rhythm: Normal rate and regular rhythm.  ?   Heart sounds: Normal heart sounds.  ?Pulmonary:  ?   Effort: Pulmonary effort is normal.  ?   Breath sounds: Normal breath sounds.  ?Abdominal:  ?   General: Bowel sounds are normal.  ?   Palpations: Abdomen is soft.  ?Musculoskeletal:     ?   General: No  tenderness or deformity. Normal range of motion.  ?   Cervical back: Normal range of motion.  ?Lymphadenopathy:  ?   Cervical: No cervical adenopathy.  ?Skin: ?   General: Skin is warm and dry.  ?   Findings: No erythema or rash.  ?Neurological:  ?   Mental Status: He is alert and oriented to person, place, and time.  ?Psychiatric:     ?   Behavior: Behavior normal.     ?  Thought Content: Thought content normal.     ?   Judgment: Judgment normal.  ? ?Lab Results  ?Component Value Date  ? WBC 3.9 (L) 10/19/2021  ? HGB 12.3 (L) 10/19/2021  ? HCT 34.9 (L) 10/19/2021  ? MCV 97.2 10/19/2021  ? PLT 84 (L) 10/19/2021  ? ?  Chemistry   ?   ?Component Value Date/Time  ? NA 140 10/19/2021 1141  ? NA 142 10/15/2019 1500  ? K 4.6 10/19/2021 1141  ? CL 104 10/19/2021 1141  ? CO2 30 10/19/2021 1141  ? BUN 26 (H) 10/19/2021 1141  ? BUN 25 10/15/2019 1500  ? CREATININE 1.15 10/19/2021 1141  ?    ?Component Value Date/Time  ? CALCIUM 8.7 (L) 10/19/2021 1141  ? ALKPHOS 78 10/19/2021 1141  ? AST 20 10/19/2021 1141  ? ALT 13 10/19/2021 1141  ? BILITOT 0.7 10/19/2021 1141  ?  ? ? ?Impression and Plan: ?Riley Eaton is a very nice 81 year old white male.  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 ? ?We will go ahead with a 3rd cycle of Velcade/Decadron.  Hopefully, we will see that his light chains continue to come down nicely.  Hopefully, the IgG level will also come down. ? ?I will plan for follow-up in 1 month.  Every see continued improvement with his myeloma levels, hopefully we can start to spread his appointments out a little bit longer. ? ? ? ?[name], MD ?3/10/20231:14 PM ? ? ? ? ? ? ?

## 2021-10-20 LAB — IGG, IGA, IGM
IgA: 149 mg/dL (ref 61–437)
IgG (Immunoglobin G), Serum: 990 mg/dL (ref 603–1613)
IgM (Immunoglobulin M), Srm: 52 mg/dL (ref 15–143)

## 2021-10-22 LAB — KAPPA/LAMBDA LIGHT CHAINS
Kappa free light chain: 454 mg/L — ABNORMAL HIGH (ref 3.3–19.4)
Kappa, lambda light chain ratio: 22.25 — ABNORMAL HIGH (ref 0.26–1.65)
Lambda free light chains: 20.4 mg/L (ref 5.7–26.3)

## 2021-10-24 LAB — PROTEIN ELECTROPHORESIS, SERUM, WITH REFLEX
A/G Ratio: 1.4 (ref 0.7–1.7)
Albumin ELP: 3.7 g/dL (ref 2.9–4.4)
Alpha-1-Globulin: 0.3 g/dL (ref 0.0–0.4)
Alpha-2-Globulin: 0.5 g/dL (ref 0.4–1.0)
Beta Globulin: 0.8 g/dL (ref 0.7–1.3)
Gamma Globulin: 1 g/dL (ref 0.4–1.8)
Globulin, Total: 2.6 g/dL (ref 2.2–3.9)
M-Spike, %: 0.4 g/dL — ABNORMAL HIGH
SPEP Interpretation: 0
Total Protein ELP: 6.3 g/dL (ref 6.0–8.5)

## 2021-10-24 LAB — IMMUNOFIXATION REFLEX, SERUM
IgA: 149 mg/dL (ref 61–437)
IgG (Immunoglobin G), Serum: 956 mg/dL (ref 603–1613)
IgM (Immunoglobulin M), Srm: 48 mg/dL (ref 15–143)

## 2021-10-26 ENCOUNTER — Inpatient Hospital Stay: Payer: Medicare PPO

## 2021-10-26 ENCOUNTER — Other Ambulatory Visit: Payer: Self-pay

## 2021-10-26 VITALS — BP 116/60 | HR 54 | Temp 97.7°F | Resp 18

## 2021-10-26 DIAGNOSIS — D472 Monoclonal gammopathy: Secondary | ICD-10-CM

## 2021-10-26 DIAGNOSIS — Z5111 Encounter for antineoplastic chemotherapy: Secondary | ICD-10-CM | POA: Diagnosis not present

## 2021-10-26 LAB — CMP (CANCER CENTER ONLY)
ALT: 14 U/L (ref 0–44)
AST: 21 U/L (ref 15–41)
Albumin: 4.2 g/dL (ref 3.5–5.0)
Alkaline Phosphatase: 82 U/L (ref 38–126)
Anion gap: 6 (ref 5–15)
BUN: 29 mg/dL — ABNORMAL HIGH (ref 8–23)
CO2: 31 mmol/L (ref 22–32)
Calcium: 9.5 mg/dL (ref 8.9–10.3)
Chloride: 105 mmol/L (ref 98–111)
Creatinine: 1.15 mg/dL (ref 0.61–1.24)
GFR, Estimated: >60 mL/min (ref >60)
Glucose, Bld: 107 mg/dL — ABNORMAL HIGH (ref 70–99)
Potassium: 4.6 mmol/L (ref 3.5–5.1)
Sodium: 142 mmol/L (ref 135–145)
Total Bilirubin: 0.9 mg/dL (ref 0.3–1.2)
Total Protein: 6.6 g/dL (ref 6.5–8.1)

## 2021-10-26 LAB — CBC WITH DIFFERENTIAL (CANCER CENTER ONLY)
Abs Immature Granulocytes: 0.05 10*3/uL (ref 0.00–0.07)
Basophils Absolute: 0 10*3/uL (ref 0.0–0.1)
Basophils Relative: 0 %
Eosinophils Absolute: 0.3 10*3/uL (ref 0.0–0.5)
Eosinophils Relative: 5 %
HCT: 36.1 % — ABNORMAL LOW (ref 39.0–52.0)
Hemoglobin: 12.6 g/dL — ABNORMAL LOW (ref 13.0–17.0)
Immature Granulocytes: 1 %
Lymphocytes Relative: 19 %
Lymphs Abs: 1.1 10*3/uL (ref 0.7–4.0)
MCH: 34.1 pg — ABNORMAL HIGH (ref 26.0–34.0)
MCHC: 34.9 g/dL (ref 30.0–36.0)
MCV: 97.8 fL (ref 80.0–100.0)
Monocytes Absolute: 0.5 10*3/uL (ref 0.1–1.0)
Monocytes Relative: 8 %
Neutro Abs: 3.8 10*3/uL (ref 1.7–7.7)
Neutrophils Relative %: 67 %
Platelet Count: 80 10*3/uL — ABNORMAL LOW (ref 150–400)
RBC: 3.69 MIL/uL — ABNORMAL LOW (ref 4.22–5.81)
RDW: 13.3 % (ref 11.5–15.5)
WBC Count: 5.8 10*3/uL (ref 4.0–10.5)
nRBC: 0 % (ref 0.0–0.2)

## 2021-10-26 MED ORDER — DEXAMETHASONE 4 MG PO TABS
20.0000 mg | ORAL_TABLET | Freq: Once | ORAL | Status: AC
  Administered 2021-10-26: 20 mg via ORAL
  Filled 2021-10-26: qty 5

## 2021-10-26 MED ORDER — BORTEZOMIB CHEMO SQ INJECTION 3.5 MG (2.5MG/ML)
1.3000 mg/m2 | Freq: Once | INTRAMUSCULAR | Status: AC
  Administered 2021-10-26: 2.5 mg via SUBCUTANEOUS
  Filled 2021-10-26: qty 1

## 2021-10-26 NOTE — Patient Instructions (Addendum)
Riley Eaton  Discharge Instructions: ?Thank you for choosing [facility] to provide your oncology and hematology care.  ? ?If you have a lab appointment with the [facility], please go directly to the [facility] and check in at the registration area. ? ?Wear comfortable clothing and clothing appropriate for easy access to any Portacath or PICC line.  ? ?We strive to give you quality time with your provider. You may need to reschedule your appointment if you arrive late (15 or more minutes).  Arriving late affects you and other patients whose appointments are after yours.  Also, if you miss three or more appointments without notifying the office, you may be dismissed from the clinic at the provider?s discretion.    ?  ?For prescription refill requests, have your pharmacy contact our office and allow 72 hours for refills to be completed.   ? ?Today you received the following chemotherapy and/or immunotherapy agents Velcade ?  ?To help prevent nausea and vomiting after your treatment, we encourage you to take your nausea medication as directed. ? ?BELOW ARE SYMPTOMS THAT SHOULD BE REPORTED IMMEDIATELY: ?*FEVER GREATER THAN 100.4 F (38 ?C) OR HIGHER ?*CHILLS OR SWEATING ?*NAUSEA AND VOMITING THAT IS NOT CONTROLLED WITH YOUR NAUSEA MEDICATION ?*UNUSUAL SHORTNESS OF BREATH ?*UNUSUAL BRUISING OR BLEEDING ?*URINARY PROBLEMS (pain or burning when urinating, or frequent urination) ?*BOWEL PROBLEMS (unusual diarrhea, constipation, pain near the anus) ?TENDERNESS IN MOUTH AND THROAT WITH OR WITHOUT PRESENCE OF ULCERS (sore throat, sores in mouth, or a toothache) ?UNUSUAL RASH, SWELLING OR PAIN  ?UNUSUAL VAGINAL DISCHARGE OR ITCHING  ? ?Items with * indicate a potential emergency and should be followed up as soon as possible or go to the Emergency Department if any problems should occur. ? ?Please show the CHEMOTHERAPY ALERT CARD or IMMUNOTHERAPY ALERT CARD at check-in to the  Emergency Department and triage nurse. ?Should you have questions after your visit or need to cancel or reschedule your appointment, please contact [facility]  [phone] and follow the prompts.  Office hours are 8:00 a.m. to 4:30 p.m. Monday - Friday. Please note that voicemails left after 4:00 p.m. may not be returned until the following business day.  We are closed weekends and major holidays. You have access to a nurse at all times for urgent questions. Please call the main number to the clinic [phone] and follow the prompts. ? ?For any non-urgent questions, you may also contact your provider using MyChart. We now offer e-Visits for anyone 75 and older to request care online for non-urgent symptoms. For details visit [email or web address]. ?  ?Also download the MyChart app! Go to the app store, search "MyChart", open the app, select [facility], and log in with your MyChart username and password. ? ?Due to Covid, a mask is required upon entering the hospital/clinic. If you do not have a mask, one will be given to you upon arrival. For doctor visits, patients may have 1 support person aged 52 or older with them. For treatment visits, patients cannot have anyone with them due to current Covid guidelines and our immunocompromised population.  ?

## 2021-10-26 NOTE — Progress Notes (Signed)
Reviewed patient labs with Dr. Marin Olp and pt ok to treat with platelets 80 ?

## 2021-11-02 ENCOUNTER — Inpatient Hospital Stay: Payer: Medicare PPO

## 2021-11-02 ENCOUNTER — Other Ambulatory Visit: Payer: Self-pay

## 2021-11-02 VITALS — BP 109/62 | HR 55 | Temp 97.6°F | Resp 17

## 2021-11-02 DIAGNOSIS — Z5111 Encounter for antineoplastic chemotherapy: Secondary | ICD-10-CM | POA: Diagnosis not present

## 2021-11-02 DIAGNOSIS — D472 Monoclonal gammopathy: Secondary | ICD-10-CM

## 2021-11-02 LAB — CBC WITH DIFFERENTIAL/PLATELET
Abs Immature Granulocytes: 0.03 10*3/uL (ref 0.00–0.07)
Basophils Absolute: 0 10*3/uL (ref 0.0–0.1)
Basophils Relative: 1 %
Eosinophils Absolute: 0.2 10*3/uL (ref 0.0–0.5)
Eosinophils Relative: 5 %
HCT: 35 % — ABNORMAL LOW (ref 39.0–52.0)
Hemoglobin: 12.3 g/dL — ABNORMAL LOW (ref 13.0–17.0)
Immature Granulocytes: 1 %
Lymphocytes Relative: 23 %
Lymphs Abs: 1 10*3/uL (ref 0.7–4.0)
MCH: 34.5 pg — ABNORMAL HIGH (ref 26.0–34.0)
MCHC: 35.1 g/dL (ref 30.0–36.0)
MCV: 98 fL (ref 80.0–100.0)
Monocytes Absolute: 0.4 10*3/uL (ref 0.1–1.0)
Monocytes Relative: 8 %
Neutro Abs: 2.7 10*3/uL (ref 1.7–7.7)
Neutrophils Relative %: 62 %
Platelets: 78 10*3/uL — ABNORMAL LOW (ref 150–400)
RBC: 3.57 MIL/uL — ABNORMAL LOW (ref 4.22–5.81)
RDW: 13.1 % (ref 11.5–15.5)
WBC: 4.3 10*3/uL (ref 4.0–10.5)
nRBC: 0 % (ref 0.0–0.2)

## 2021-11-02 LAB — COMPREHENSIVE METABOLIC PANEL
ALT: 21 U/L (ref 0–44)
AST: 29 U/L (ref 15–41)
Albumin: 4.1 g/dL (ref 3.5–5.0)
Alkaline Phosphatase: 84 U/L (ref 38–126)
Anion gap: 5 (ref 5–15)
BUN: 25 mg/dL — ABNORMAL HIGH (ref 8–23)
CO2: 32 mmol/L (ref 22–32)
Calcium: 9.4 mg/dL (ref 8.9–10.3)
Chloride: 102 mmol/L (ref 98–111)
Creatinine, Ser: 1.16 mg/dL (ref 0.61–1.24)
GFR, Estimated: >60 mL/min (ref >60)
Glucose, Bld: 105 mg/dL — ABNORMAL HIGH (ref 70–99)
Potassium: 4.4 mmol/L (ref 3.5–5.1)
Sodium: 139 mmol/L (ref 135–145)
Total Bilirubin: 0.9 mg/dL (ref 0.3–1.2)
Total Protein: 6.5 g/dL (ref 6.5–8.1)

## 2021-11-02 MED ORDER — BORTEZOMIB CHEMO SQ INJECTION 3.5 MG (2.5MG/ML)
1.3000 mg/m2 | Freq: Once | INTRAMUSCULAR | Status: AC
  Administered 2021-11-02: 2.5 mg via SUBCUTANEOUS
  Filled 2021-11-02: qty 1

## 2021-11-02 MED ORDER — DEXAMETHASONE 4 MG PO TABS
20.0000 mg | ORAL_TABLET | Freq: Once | ORAL | Status: AC
  Administered 2021-11-02: 20 mg via ORAL
  Filled 2021-11-02: qty 5

## 2021-11-02 NOTE — Patient Instructions (Signed)
Riley Eaton [place]  Discharge Instructions: ?Thank you for choosing [facility] to provide your oncology and hematology care.  ? ?If you have a lab appointment with the [facility], please go directly to the [facility] and check in [place] the registration area. ? ?Wear comfortable clothing and clothing appropriate for easy access to any Portacath or PICC line.  ? ?We strive to give you quality time with your provider. You may need to reschedule your appointment if you arrive late (15 or more minutes).  Arriving late affects you and other patients whose appointments are after yours.  Also, if you miss three or more appointments without notifying the office, you may be dismissed from the clinic [place] the provider?s discretion.    ?  ?For prescription refill requests, have your pharmacy contact our office and allow 72 hours for refills to be completed.   ? ?Today you received the following chemotherapy and/or immunotherapy agents Velcade    ?  ?To help prevent nausea and vomiting after your treatment, we encourage you to take your nausea medication as directed. ? ?BELOW ARE SYMPTOMS THAT SHOULD BE REPORTED IMMEDIATELY: ?*FEVER GREATER THAN 100.4 F (38 ?C) OR HIGHER ?*CHILLS OR SWEATING ?*NAUSEA AND VOMITING THAT IS NOT CONTROLLED WITH YOUR NAUSEA MEDICATION ?*UNUSUAL SHORTNESS OF BREATH ?*UNUSUAL BRUISING OR BLEEDING ?*URINARY PROBLEMS (pain or burning when urinating, or frequent urination) ?*BOWEL PROBLEMS (unusual diarrhea, constipation, pain near the anus) ?TENDERNESS IN MOUTH AND THROAT WITH OR WITHOUT PRESENCE OF ULCERS (sore throat, sores in mouth, or a toothache) ?UNUSUAL RASH, SWELLING OR PAIN  ?UNUSUAL VAGINAL DISCHARGE OR ITCHING  ? ?Items with * indicate a potential emergency and should be followed up as soon as possible or go to the Emergency Department if any problems should occur. ? ?Please show the CHEMOTHERAPY ALERT CARD or IMMUNOTHERAPY ALERT CARD [place] check-in to the  Emergency Department and triage nurse. ?Should you have questions after your visit or need to cancel or reschedule your appointment, please contact [facility]  [phone] and follow the prompts.  Office hours are 8:00 a.m. to 4:30 p.m. Monday - Friday. Please note that voicemails left after 4:00 p.m. may not be returned until the following business day.  We are closed weekends and major holidays. You have access to a nurse [place] all times for urgent questions. Please call the main number to the clinic [phone] and follow the prompts. ? ?For any non-urgent questions, you may also contact your provider using MyChart. We now offer e-Visits for anyone 25 and older to request care online for non-urgent symptoms. For details visit [email or web address]. ?  ?Also download the MyChart app! Go to the app store, search "MyChart", open the app, select [facility], and log in with your MyChart username and password. ? ?Due to Covid, a mask is required upon entering the hospital/clinic. If you do not have a mask, one will be given to you upon arrival. For doctor visits, patients may have 1 support person aged 45 or older with them. For treatment visits, patients cannot have anyone with them due to current Covid guidelines and our immunocompromised population.  ?

## 2021-11-02 NOTE — Progress Notes (Signed)
Ok to treat with platelets of 78 per Dr Ennever. dph 

## 2021-11-05 ENCOUNTER — Other Ambulatory Visit: Payer: Self-pay | Admitting: Hematology & Oncology

## 2021-11-05 DIAGNOSIS — D472 Monoclonal gammopathy: Secondary | ICD-10-CM

## 2021-11-08 ENCOUNTER — Encounter: Payer: Self-pay | Admitting: Hematology & Oncology

## 2021-11-20 ENCOUNTER — Other Ambulatory Visit: Payer: Self-pay

## 2021-11-20 ENCOUNTER — Inpatient Hospital Stay: Payer: Medicare PPO | Admitting: Family

## 2021-11-20 ENCOUNTER — Inpatient Hospital Stay: Payer: Medicare PPO | Attending: Hematology & Oncology

## 2021-11-20 ENCOUNTER — Inpatient Hospital Stay: Payer: Medicare PPO

## 2021-11-20 ENCOUNTER — Encounter: Payer: Self-pay | Admitting: Family

## 2021-11-20 VITALS — BP 107/63 | HR 59 | Temp 98.2°F | Resp 18 | Ht 66.93 in | Wt 187.1 lb

## 2021-11-20 DIAGNOSIS — Z5111 Encounter for antineoplastic chemotherapy: Secondary | ICD-10-CM | POA: Diagnosis not present

## 2021-11-20 DIAGNOSIS — D472 Monoclonal gammopathy: Secondary | ICD-10-CM

## 2021-11-20 DIAGNOSIS — C9000 Multiple myeloma not having achieved remission: Secondary | ICD-10-CM | POA: Insufficient documentation

## 2021-11-20 LAB — CBC WITH DIFFERENTIAL/PLATELET
Abs Immature Granulocytes: 0.04 10*3/uL (ref 0.00–0.07)
Basophils Absolute: 0 10*3/uL (ref 0.0–0.1)
Basophils Relative: 0 %
Eosinophils Absolute: 0.3 10*3/uL (ref 0.0–0.5)
Eosinophils Relative: 6 %
HCT: 36 % — ABNORMAL LOW (ref 39.0–52.0)
Hemoglobin: 12.8 g/dL — ABNORMAL LOW (ref 13.0–17.0)
Immature Granulocytes: 1 %
Lymphocytes Relative: 18 %
Lymphs Abs: 0.8 10*3/uL (ref 0.7–4.0)
MCH: 34.4 pg — ABNORMAL HIGH (ref 26.0–34.0)
MCHC: 35.6 g/dL (ref 30.0–36.0)
MCV: 96.8 fL (ref 80.0–100.0)
Monocytes Absolute: 0.3 10*3/uL (ref 0.1–1.0)
Monocytes Relative: 7 %
Neutro Abs: 2.8 10*3/uL (ref 1.7–7.7)
Neutrophils Relative %: 68 %
Platelets: 93 10*3/uL — ABNORMAL LOW (ref 150–400)
RBC: 3.72 MIL/uL — ABNORMAL LOW (ref 4.22–5.81)
RDW: 13.1 % (ref 11.5–15.5)
WBC: 4.2 10*3/uL (ref 4.0–10.5)
nRBC: 0 % (ref 0.0–0.2)

## 2021-11-20 LAB — LACTATE DEHYDROGENASE: LDH: 262 U/L — ABNORMAL HIGH (ref 98–192)

## 2021-11-20 LAB — COMPREHENSIVE METABOLIC PANEL
ALT: 16 U/L (ref 0–44)
AST: 24 U/L (ref 15–41)
Albumin: 4.3 g/dL (ref 3.5–5.0)
Alkaline Phosphatase: 73 U/L (ref 38–126)
Anion gap: 5 (ref 5–15)
BUN: 26 mg/dL — ABNORMAL HIGH (ref 8–23)
CO2: 31 mmol/L (ref 22–32)
Calcium: 9.8 mg/dL (ref 8.9–10.3)
Chloride: 104 mmol/L (ref 98–111)
Creatinine, Ser: 1.18 mg/dL (ref 0.61–1.24)
GFR, Estimated: >60 mL/min (ref >60)
Glucose, Bld: 117 mg/dL — ABNORMAL HIGH (ref 70–99)
Potassium: 3.9 mmol/L (ref 3.5–5.1)
Sodium: 140 mmol/L (ref 135–145)
Total Bilirubin: 1 mg/dL (ref 0.3–1.2)
Total Protein: 6.7 g/dL (ref 6.5–8.1)

## 2021-11-20 MED ORDER — DEXAMETHASONE 4 MG PO TABS: 20.0000 mg | ORAL_TABLET | Freq: Once | ORAL | Status: DC

## 2021-11-20 MED ORDER — BORTEZOMIB CHEMO SQ INJECTION 3.5 MG (2.5MG/ML)
1.3000 mg/m2 | Freq: Once | INTRAMUSCULAR | Status: AC
  Administered 2021-11-20: 2.5 mg via SUBCUTANEOUS
  Filled 2021-11-20: qty 1

## 2021-11-20 NOTE — Progress Notes (Signed)
OK to treat with platelet count-93 per order of Dr. Marin Olp.  ?

## 2021-11-20 NOTE — Patient Instructions (Signed)
[facility] [place]  Discharge Instructions: ?Thank you for choosing [facility] to provide your oncology and hematology care.  ? ?If you have a lab appointment with the [facility], please go directly to the [facility] and check in at the registration area. ? ?Wear comfortable clothing and clothing appropriate for easy access to any Portacath or PICC line.  ? ?We strive to give you quality time with your provider. You may need to reschedule your appointment if you arrive late (15 or more minutes).  Arriving late affects you and other patients whose appointments are after yours.  Also, if you miss three or more appointments without notifying the office, you may be dismissed from the clinic at the provider?s discretion.    ?  ?For prescription refill requests, have your pharmacy contact our office and allow 72 hours for refills to be completed.   ? ?Today you received the following chemotherapy and/or immunotherapy agents:  Velcade    ?  ?To help prevent nausea and vomiting after your treatment, we encourage you to take your nausea medication as directed. ? ?BELOW ARE SYMPTOMS THAT SHOULD BE REPORTED IMMEDIATELY: ?*FEVER GREATER THAN 100.4 F (38 ?C) OR HIGHER ?*CHILLS OR SWEATING ?*NAUSEA AND VOMITING THAT IS NOT CONTROLLED WITH YOUR NAUSEA MEDICATION ?*UNUSUAL SHORTNESS OF BREATH ?*UNUSUAL BRUISING OR BLEEDING ?*URINARY PROBLEMS (pain or burning when urinating, or frequent urination) ?*BOWEL PROBLEMS (unusual diarrhea, constipation, pain near the anus) ?TENDERNESS IN MOUTH AND THROAT WITH OR WITHOUT PRESENCE OF ULCERS (sore throat, sores in mouth, or a toothache) ?UNUSUAL RASH, SWELLING OR PAIN  ?UNUSUAL VAGINAL DISCHARGE OR ITCHING  ? ?Items with * indicate a potential emergency and should be followed up as soon as possible or go to the Emergency Department if any problems should occur. ? ?Please show the CHEMOTHERAPY ALERT CARD or IMMUNOTHERAPY ALERT CARD at check-in to the  Emergency Department and triage nurse. ?Should you have questions after your visit or need to cancel or reschedule your appointment, please contact [facility]  [phone] and follow the prompts.  Office hours are 8:00 a.m. to 4:30 p.m. Monday - Friday. Please note that voicemails left after 4:00 p.m. may not be returned until the following business day.  We are closed weekends and major holidays. You have access to a nurse at all times for urgent questions. Please call the main number to the clinic [phone] and follow the prompts. ? ?For any non-urgent questions, you may also contact your provider using MyChart. We now offer e-Visits for anyone 65 and older to request care online for non-urgent symptoms. For details visit [email or web address] ?  ?Also download the MyChart app! Go to the app store, search "MyChart", open the app, select [facility], and log in with your MyChart username and password. ? ?Due to Covid, a mask is required upon entering the hospital/clinic. If you do not have a mask, one will be given to you upon arrival. For doctor visits, patients may have 1 support person aged 79 or older with them. For treatment visits, patients cannot have anyone with them due to current Covid guidelines and our immunocompromised population.  ?

## 2021-11-20 NOTE — Progress Notes (Signed)
?Hematology and Oncology Follow Up Visit ? ?Riley Eaton ?[id number] ?Apr 18, 1941 81 y.o. ?11/20/2021 ? ? 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  ?Current Therapy:        ?Velcade/Decadron -- ( 3 wk on/1 wk off) -- s/p cycle 3 -- started on 08/24/2021 ?  ?Interim History:  Riley Eaton is here today for follow-up and treatment. He is doing well  but notes some mild SOB at times with over exertion. He takes a break to rest as needed and this will resolve.  ?March M-spike was 0.4 g/dL, IgG level was 990 mg/dL and kappa light chains 45.50 mg/dL.  ?No episodes of bleeding. No bruising or petechiae.  ?No fever, chills, n/v, cough, rash, dizziness, chest pain, palpitations, abdominal pain or changes in bowel or bladder habits.  ?No swelling,  numbness or tingling in his extremities at this time. 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   ?No falls or syncope to report.  ?He has been eating well and is doing his best to stay well hydrated. His weight is  ? ?ECOG Performance Status: 1 - Symptomatic but completely ambulatory ? ?Medications:  ?Allergies as of 11/20/2021   ?No Known Allergies ?  ? ?  ?Medication List  ?  ? ?  ? Accurate as of November 20, 2021  9:06 AM. If you have any questions, ask your nurse or doctor.  ?  ?  ? ?  ? ?acetaminophen 325 MG tablet ?Commonly known as: TYLENOL ?Take 2 tablets (650 mg total) by mouth every 6 (six) hours as needed for mild pain (or temp > 100). ?  ?acyclovir 400 MG tablet ?Commonly known as: ZOVIRAX ?TAKE 1 TABLET BY MOUTH TWICE A DAY ?  ?allopurinol 100 MG tablet ?Commonly known as: ZYLOPRIM ?TAKE 1 TABLET BY MOUTH EVERY DAY ?  ?aspirin 81 MG chewable tablet ?Chew 1 tablet (81 mg total) by mouth daily. ?  ?atorvastatin 40 MG tablet ?Commonly known as: LIPITOR ?Take 1 tablet (40 mg total) by mouth daily at 6 PM. ?What changed: when to take this ?  ?carvedilol 6.25 MG tablet ?Commonly known as: COREG ?Take 1 tablet by mouth 2 (two) times daily. ?   ?colchicine 0.6 MG tablet ?  ?diphenhydramine-acetaminophen 25-500 MG Tabs tablet ?Commonly known as: TYLENOL PM ?Take 1 tablet by mouth at bedtime. ?  ?doxycycline 100 MG capsule ?Commonly known as: VIBRAMYCIN ?Take by mouth. ?  ?ferrous sulfate 325 (65 FE) MG EC tablet ?Take 325 mg by mouth daily. ?  ?finasteride 5 MG tablet ?Commonly known as: PROSCAR ?Take 1 tablet (5 mg total) by mouth daily. For urination ?  ?furosemide 40 MG tablet ?Commonly known as: LASIX ?TAKE 1 TABLET BY MOUTH EVERY DAY ?  ?gabapentin 300 MG capsule ?Commonly known as: NEURONTIN ?TAKE 2 CAPSULES BY MOUTH THREE TIMES A DAY ?  ?HYDROcodone-acetaminophen 5-325 MG tablet ?Commonly known as: NORCO/VICODIN ?Take 1 tablet by mouth 4 (four) times daily. ?  ?lidocaine-prilocaine cream ?Commonly known as: EMLA ?Apply to affected area once ?  ?LORazepam 0.5 MG tablet ?Commonly known as: Ativan ?Take 1 tablet (0.5 mg total) by mouth every 6 (six) hours as needed (Nausea or vomiting). ?  ?losartan 25 MG tablet ?Commonly known as: COZAAR ?Take 25 mg by mouth daily. ?  ?Movantik 25 MG Tabs tablet ?Generic drug: naloxegol oxalate ?Take 25 mg by mouth daily. ?  ?OLANZapine 5 MG tablet ?Commonly known as: ZYPREXA ?Take 1 tablet (5 mg  total) by mouth daily. ?  ?ondansetron 4 MG disintegrating tablet ?Commonly known as: Zofran ODT ?Take 1 tablet (4 mg total) by mouth every 8 (eight) hours as needed for nausea or vomiting. ?  ?ondansetron 8 MG tablet ?Commonly known as: Zofran ?Take 1 tablet (8 mg total) by mouth 2 (two) times daily as needed (Nausea or vomiting). ?  ?pantoprazole 40 MG tablet ?Commonly known as: PROTONIX ?Take 1 tablet (40 mg total) by mouth at bedtime. ?What changed: when to take this ?  ?polyethylene glycol 17 g packet ?Commonly known as: MIRALAX / GLYCOLAX ?Take 17 g by mouth daily. ?What changed:  ?when to take this ?reasons to take this ?  ?pregabalin 150 MG capsule ?Commonly known as: LYRICA ?Take 150 mg by mouth 2 (two) times  daily. ?  ?prochlorperazine 10 MG tablet ?Commonly known as: COMPAZINE ?Take 1 tablet (10 mg total) by mouth every 6 (six) hours as needed (Nausea or vomiting). ?  ?traZODone 50 MG tablet ?Commonly known as: DESYREL ?Take 50 mg by mouth at bedtime. ?  ?vitamin B-12 500 MCG tablet ?Commonly known as: CYANOCOBALAMIN ?Take 500 mcg by mouth daily. ?  ? ?  ? ? ?Allergies: No Known Allergies ? ?Past Medical History, Surgical history, Social history, and Family History were reviewed and updated. ? ?Review of Systems: ?All other 10 point review of systems is negative.  ? ?Physical Exam: ? vitals were not taken for this visit.  ? ?Wt Readings from Last 3 Encounters:  ?10/19/21 188 lb (85.3 kg)  ?09/21/21 187 lb (84.8 kg)  ?08/07/21 182 lb (82.6 kg)  ? ? ?Ocular: Sclerae unicteric, pupils equal, round and reactive to light ?Ear-nose-throat: Oropharynx clear, dentition fair ?Lymphatic: No cervical or supraclavicular adenopathy ?Lungs no rales or rhonchi, good excursion bilaterally ?Heart regular rate and rhythm, no murmur appreciated ?Abd soft, nontender, positive bowel sounds ?MSK no focal spinal tenderness, no joint edema ?Neuro: non-focal, well-oriented, appropriate affect ?Breasts: Deferred  ? ?Lab Results  ?Component Value Date  ? WBC 4.3 11/02/2021  ? HGB 12.3 (L) 11/02/2021  ? HCT 35.0 (L) 11/02/2021  ? MCV 98.0 11/02/2021  ? PLT 78 (L) 11/02/2021  ? ?No results found for: FERRITIN, IRON, TIBC, UIBC, IRONPCTSAT ?Lab Results  ?Component Value Date  ? RBC 3.57 (L) 11/02/2021  ? ?Lab Results  ?Component Value Date  ? KPAFRELGTCHN 454.0 (H) 10/19/2021  ? LAMBDASER 20.4 10/19/2021  ? KAPLAMBRATIO 22.25 (H) 10/19/2021  ? ?Lab Results  ?Component Value Date  ? IGGSERUM 956 10/19/2021  ? IGA 149 10/19/2021  ? IGMSERUM 48 10/19/2021  ? ?Lab Results  ?Component Value Date  ? TOTALPROTELP 6.3 10/19/2021  ? ALBUMINELP 3.7 10/19/2021  ? A1GS 0.3 10/19/2021  ? A2GS 0.5 10/19/2021  ? BETS 0.8 10/19/2021  ? GAMS 1.0 10/19/2021  ?  MSPIKE 0.4 (H) 10/19/2021  ? SPEI Comment 08/07/2021  ? ?  Chemistry   ?   ?Component Value Date/Time  ? NA 139 11/02/2021 0908  ? NA 142 10/15/2019 1500  ? K 4.4 11/02/2021 0908  ? CL 102 11/02/2021 0908  ? CO2 32 11/02/2021 0908  ? BUN 25 (H) 11/02/2021 0908  ? BUN 25 10/15/2019 1500  ? CREATININE 1.16 11/02/2021 0908  ? CREATININE 1.15 10/26/2021 0855  ?    ?Component Value Date/Time  ? CALCIUM 9.4 11/02/2021 0908  ? ALKPHOS 84 11/02/2021 0908  ? AST 29 11/02/2021 0908  ? AST 21 10/26/2021 0855  ? ALT 21 11/02/2021 0908  ?  ALT 14 10/26/2021 0855  ? BILITOT 0.9 11/02/2021 0908  ? BILITOT 0.9 10/26/2021 0855  ?  ? ? ? ?Impression and Plan: Riley Eaton is a very pleasant 81 yo caucasian gentleman with IgG kappa myeloma.  ?We will proceed with cycle 5 of treatment today as planned.  ?Protein studies pending.  ?Follow-up in 1 month.  ? ?Lottie Dawson, NP ?4/11/20239:06 AM ? ?

## 2021-11-21 LAB — KAPPA/LAMBDA LIGHT CHAINS
Kappa free light chain: 503.1 mg/L — ABNORMAL HIGH (ref 3.3–19.4)
Kappa, lambda light chain ratio: 21.97 — ABNORMAL HIGH (ref 0.26–1.65)
Lambda free light chains: 22.9 mg/L (ref 5.7–26.3)

## 2021-11-21 LAB — IGG, IGA, IGM
IgA: 173 mg/dL (ref 61–437)
IgG (Immunoglobin G), Serum: 1049 mg/dL (ref 603–1613)
IgM (Immunoglobulin M), Srm: 52 mg/dL (ref 15–143)

## 2021-11-23 LAB — PROTEIN ELECTROPHORESIS, SERUM, WITH REFLEX
A/G Ratio: 1.3 (ref 0.7–1.7)
Albumin ELP: 3.7 g/dL (ref 2.9–4.4)
Alpha-1-Globulin: 0.3 g/dL (ref 0.0–0.4)
Alpha-2-Globulin: 0.5 g/dL (ref 0.4–1.0)
Beta Globulin: 0.9 g/dL (ref 0.7–1.3)
Gamma Globulin: 1.1 g/dL (ref 0.4–1.8)
Globulin, Total: 2.8 g/dL (ref 2.2–3.9)
M-Spike, %: 0.5 g/dL — ABNORMAL HIGH
SPEP Interpretation: 0
Total Protein ELP: 6.5 g/dL (ref 6.0–8.5)

## 2021-11-23 LAB — IMMUNOFIXATION REFLEX, SERUM
IgA: 190 mg/dL (ref 61–437)
IgG (Immunoglobin G), Serum: 1241 mg/dL (ref 603–1613)
IgM (Immunoglobulin M), Srm: 65 mg/dL (ref 15–143)

## 2021-12-20 ENCOUNTER — Other Ambulatory Visit: Payer: Self-pay

## 2021-12-20 ENCOUNTER — Inpatient Hospital Stay: Payer: Medicare PPO | Admitting: Hematology & Oncology

## 2021-12-20 ENCOUNTER — Inpatient Hospital Stay: Payer: Medicare PPO

## 2021-12-20 ENCOUNTER — Encounter: Payer: Self-pay | Admitting: Hematology & Oncology

## 2021-12-20 ENCOUNTER — Inpatient Hospital Stay: Payer: Medicare PPO | Attending: Hematology & Oncology

## 2021-12-20 VITALS — BP 111/61 | HR 56 | Temp 97.8°F | Resp 17 | Wt 189.0 lb

## 2021-12-20 DIAGNOSIS — D472 Monoclonal gammopathy: Secondary | ICD-10-CM

## 2021-12-20 DIAGNOSIS — Z5111 Encounter for antineoplastic chemotherapy: Secondary | ICD-10-CM | POA: Diagnosis present

## 2021-12-20 DIAGNOSIS — C9000 Multiple myeloma not having achieved remission: Secondary | ICD-10-CM | POA: Insufficient documentation

## 2021-12-20 LAB — CBC WITH DIFFERENTIAL (CANCER CENTER ONLY)
Abs Immature Granulocytes: 0.02 10*3/uL (ref 0.00–0.07)
Basophils Absolute: 0 10*3/uL (ref 0.0–0.1)
Basophils Relative: 1 %
Eosinophils Absolute: 0.3 10*3/uL (ref 0.0–0.5)
Eosinophils Relative: 7 %
HCT: 34.2 % — ABNORMAL LOW (ref 39.0–52.0)
Hemoglobin: 12.3 g/dL — ABNORMAL LOW (ref 13.0–17.0)
Immature Granulocytes: 1 %
Lymphocytes Relative: 20 %
Lymphs Abs: 0.7 10*3/uL (ref 0.7–4.0)
MCH: 35.1 pg — ABNORMAL HIGH (ref 26.0–34.0)
MCHC: 36 g/dL (ref 30.0–36.0)
MCV: 97.7 fL (ref 80.0–100.0)
Monocytes Absolute: 0.2 10*3/uL (ref 0.1–1.0)
Monocytes Relative: 7 %
Neutro Abs: 2.4 10*3/uL (ref 1.7–7.7)
Neutrophils Relative %: 64 %
Platelet Count: 87 10*3/uL — ABNORMAL LOW (ref 150–400)
RBC: 3.5 MIL/uL — ABNORMAL LOW (ref 4.22–5.81)
RDW: 12.4 % (ref 11.5–15.5)
WBC Count: 3.7 10*3/uL — ABNORMAL LOW (ref 4.0–10.5)
nRBC: 0 % (ref 0.0–0.2)

## 2021-12-20 LAB — CMP (CANCER CENTER ONLY)
ALT: 14 U/L (ref 0–44)
AST: 25 U/L (ref 15–41)
Albumin: 4.2 g/dL (ref 3.5–5.0)
Alkaline Phosphatase: 82 U/L (ref 38–126)
Anion gap: 7 (ref 5–15)
BUN: 28 mg/dL — ABNORMAL HIGH (ref 8–23)
CO2: 28 mmol/L (ref 22–32)
Calcium: 9.1 mg/dL (ref 8.9–10.3)
Chloride: 104 mmol/L (ref 98–111)
Creatinine: 1.09 mg/dL (ref 0.61–1.24)
GFR, Estimated: >60 mL/min (ref >60)
Glucose, Bld: 119 mg/dL — ABNORMAL HIGH (ref 70–99)
Potassium: 4 mmol/L (ref 3.5–5.1)
Sodium: 139 mmol/L (ref 135–145)
Total Bilirubin: 0.9 mg/dL (ref 0.3–1.2)
Total Protein: 6.7 g/dL (ref 6.5–8.1)

## 2021-12-20 LAB — LACTATE DEHYDROGENASE: LDH: 288 U/L — ABNORMAL HIGH (ref 98–192)

## 2021-12-20 MED ORDER — DEXAMETHASONE 4 MG PO TABS
20.0000 mg | ORAL_TABLET | Freq: Once | ORAL | Status: AC
  Administered 2021-12-20: 20 mg via ORAL
  Filled 2021-12-20: qty 5

## 2021-12-20 MED ORDER — BORTEZOMIB CHEMO SQ INJECTION 3.5 MG (2.5MG/ML)
1.3000 mg/m2 | Freq: Once | INTRAMUSCULAR | Status: AC
  Administered 2021-12-20: 2.5 mg via SUBCUTANEOUS
  Filled 2021-12-20: qty 1

## 2021-12-20 NOTE — Progress Notes (Signed)
?Hematology and Oncology Follow Up Visit ? ?Riley Eaton ?[id number] ?[birth year] 81 y.o. ?12/20/2021 ? ? 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 ?Current Therapy:   ?Velcade/Decadron -- ( 3 wk on/1 wk off) -- s/p cycle #3 -- start on 08/24/2021 ?    ?Interim History:  Riley Eaton is back for follow-up.  He feels well.  He has had no complaints.  There has been no cough or shortness of breath.  There has been no nausea or vomiting.  There has been no change in bowel or bladder habits.  He takes something to help with constipation.  He has had no bony pain.  There has been no leg swelling. ? ?His last M spike was 0.5 g/dL.  The IgG level was 1100 mg/dL.  Kappa light chain was a little bit on the higher side.  It was '500mg'$ /L.  We will get have to watch this closely. ? ?He has had no fever.  There has been no rashes.  He has had no bleeding.  There is been no headaches. ? ?He has a chronic back issues which are stable. ? ?His blood sugars have been holding fairly well. ? ?Overall, I would say his performance status is ECOG 1. ? ? ?Medications:  ?Current Outpatient Medications:  ?  acetaminophen (TYLENOL) 325 MG tablet, Take 2 tablets (650 mg total) by mouth every 6 (six) hours as needed for mild pain (or temp > 100)., Disp: 20 tablet, Rfl: 0 ?  acyclovir (ZOVIRAX) 400 MG tablet, TAKE 1 TABLET BY MOUTH TWICE A DAY, Disp: 180 tablet, Rfl: 1 ?  allopurinol (ZYLOPRIM) 100 MG tablet, TAKE 1 TABLET BY MOUTH EVERY DAY (Patient taking differently: Take 100 mg by mouth daily.), Disp: 90 tablet, Rfl: 1 ?  aspirin 81 MG chewable tablet, Chew 1 tablet (81 mg total) by mouth daily., Disp: 30 tablet, Rfl: 0 ?  atorvastatin (LIPITOR) 40 MG tablet, Take 1 tablet (40 mg total) by mouth daily at 6 PM. (Patient taking differently: Take 40 mg by mouth at bedtime.), Disp: 30 tablet, Rfl: 0 ?  carvedilol (COREG) 6.25 MG tablet, Take 1 tablet by mouth 2 (two) times daily., Disp: , Rfl:  ?  colchicine 0.6 MG tablet,  , Disp: , Rfl:  ?  diphenhydramine-acetaminophen (TYLENOL PM) 25-500 MG TABS tablet, Take 1 tablet by mouth at bedtime., Disp: , Rfl:  ?  doxycycline (VIBRAMYCIN) 100 MG capsule, Take by mouth., Disp: , Rfl:  ?  ferrous sulfate 325 (65 FE) MG EC tablet, Take 325 mg by mouth daily., Disp: , Rfl:  ?  finasteride (PROSCAR) 5 MG tablet, Take 1 tablet (5 mg total) by mouth daily. For urination, Disp: 90 tablet, Rfl: 3 ?  furosemide (LASIX) 40 MG tablet, TAKE 1 TABLET BY MOUTH EVERY DAY, Disp: 90 tablet, Rfl: 3 ?  gabapentin (NEURONTIN) 300 MG capsule, TAKE 2 CAPSULES BY MOUTH THREE TIMES A DAY, Disp: , Rfl:  ?  HYDROcodone-acetaminophen (NORCO/VICODIN) 5-325 MG tablet, Take 1 tablet by mouth 4 (four) times daily., Disp: , Rfl:  ?  lidocaine-prilocaine (EMLA) cream, Apply to affected area once, Disp: 30 g, Rfl: 3 ?  LORazepam (ATIVAN) 0.5 MG tablet, Take 1 tablet (0.5 mg total) by mouth every 6 (six) hours as needed (Nausea or vomiting)., Disp: 30 tablet, Rfl: 0 ?  losartan (COZAAR) 25 MG tablet, Take 25 mg by mouth daily., Disp: , Rfl:  ?  MOVANTIK 25 MG TABS tablet, Take 25 mg  by mouth daily., Disp: , Rfl:  ?  OLANZapine (ZYPREXA) 5 MG tablet, Take 1 tablet (5 mg total) by mouth daily., Disp: 30 tablet, Rfl: 0 ?  ondansetron (ZOFRAN ODT) 4 MG disintegrating tablet, Take 1 tablet (4 mg total) by mouth every 8 (eight) hours as needed for nausea or vomiting., Disp: 20 tablet, Rfl: 0 ?  ondansetron (ZOFRAN) 8 MG tablet, Take 1 tablet (8 mg total) by mouth 2 (two) times daily as needed (Nausea or vomiting)., Disp: 30 tablet, Rfl: 1 ?  pantoprazole (PROTONIX) 40 MG tablet, Take 1 tablet (40 mg total) by mouth at bedtime. (Patient taking differently: Take 40 mg by mouth daily.), Disp: 30 tablet, Rfl: 0 ?  polyethylene glycol (MIRALAX / GLYCOLAX) 17 g packet, Take 17 g by mouth daily. (Patient taking differently: Take 17 g by mouth daily as needed (constipation).), Disp: 14 each, Rfl: 0 ?  pregabalin (LYRICA) 150 MG capsule,  Take 150 mg by mouth 2 (two) times daily., Disp: , Rfl:  ?  prochlorperazine (COMPAZINE) 10 MG tablet, Take 1 tablet (10 mg total) by mouth every 6 (six) hours as needed (Nausea or vomiting)., Disp: 30 tablet, Rfl: 1 ?  traZODone (DESYREL) 50 MG tablet, Take 50 mg by mouth at bedtime., Disp: , Rfl:  ?  vitamin B-12 (CYANOCOBALAMIN) 500 MCG tablet, Take 500 mcg by mouth daily., Disp: , Rfl:  ? ?Allergies: No Known Allergies ? ?Past Medical History, Surgical history, Social history, and Family History were reviewed and updated. ? ?Review of Systems: ?Review of Systems  ?Constitutional: Negative.   ?HENT:  Negative.    ?Eyes: Negative.   ?Respiratory: Negative.    ?Cardiovascular: Negative.   ?Gastrointestinal: Negative.   ?Endocrine: Negative.   ?Genitourinary: Negative.    ?Musculoskeletal: Negative.   ?Skin: Negative.   ?Neurological: Negative.   ?Hematological: Negative.   ?Psychiatric/Behavioral: Negative.    ? ?Physical Exam: ? weight is 189 lb (85.7 kg). His oral temperature is 97.8 ?F (36.6 ?C). His blood pressure is 111/61 and his pulse is 56 (abnormal). His respiration is 17 and oxygen saturation is 97%.  ? ?Wt Readings from Last 3 Encounters:  ?12/20/21 189 lb (85.7 kg)  ?11/20/21 187 lb 1.9 oz (84.9 kg)  ?10/19/21 188 lb (85.3 kg)  ? ? ?Physical Exam ?Vitals reviewed.  ?HENT:  ?   Head: Normocephalic and atraumatic.  ?Eyes:  ?   Pupils: Pupils are equal, round, and reactive to light.  ?Cardiovascular:  ?   Rate and Rhythm: Normal rate and regular rhythm.  ?   Heart sounds: Normal heart sounds.  ?Pulmonary:  ?   Effort: Pulmonary effort is normal.  ?   Breath sounds: Normal breath sounds.  ?Abdominal:  ?   General: Bowel sounds are normal.  ?   Palpations: Abdomen is soft.  ?Musculoskeletal:     ?   General: No tenderness or deformity. Normal range of motion.  ?   Cervical back: Normal range of motion.  ?Lymphadenopathy:  ?   Cervical: No cervical adenopathy.  ?Skin: ?   General: Skin is warm and dry.  ?    Findings: No erythema or rash.  ?Neurological:  ?   Mental Status: He is alert and oriented to person, place, and time.  ?Psychiatric:     ?   Behavior: Behavior normal.     ?   Thought Content: Thought content normal.     ?   Judgment: Judgment normal.  ? ?Lab Results  ?  Component Value Date  ? WBC 3.7 (L) 12/20/2021  ? HGB 12.3 (L) 12/20/2021  ? HCT 34.2 (L) 12/20/2021  ? MCV 97.7 12/20/2021  ? PLT 87 (L) 12/20/2021  ? ?  Chemistry   ?   ?Component Value Date/Time  ? NA 139 12/20/2021 0819  ? NA 142 10/15/2019 1500  ? K 4.0 12/20/2021 0819  ? CL 104 12/20/2021 0819  ? CO2 28 12/20/2021 0819  ? BUN 28 (H) 12/20/2021 0819  ? BUN 25 10/15/2019 1500  ? CREATININE 1.09 12/20/2021 0819  ?    ?Component Value Date/Time  ? CALCIUM 9.1 12/20/2021 0819  ? ALKPHOS 82 12/20/2021 0819  ? AST 25 12/20/2021 0819  ? ALT 14 12/20/2021 0819  ? BILITOT 0.9 12/20/2021 0819  ?  ? ? ?Impression and Plan: ?Mr. Loescher is a very nice 81 year old white male.  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 ? ?We will go ahead with a 34th cycle of Velcade/Decadron.  We will have to see what the monoclonal studies show.  If they are worsening, then we will have to add Faspro. ? ?I just want to make sure that we are effective but not overly intrusive with his life. ? ?I will plan to get him back to see me in another month.  ? ? ?[name], MD ?5/11/20239:09 AM ? ? ? ? ? ? ?

## 2021-12-20 NOTE — Progress Notes (Signed)
Ok to treat with platelets of 87 per Dr Ennever. dph 

## 2021-12-20 NOTE — Patient Instructions (Signed)
[facility]  Discharge Instructions: ?Thank you for choosing [facility] to provide your oncology and hematology care.  ? ?If you have a lab appointment with the [facility], please go directly to the [facility] and check in at the registration area. ? ?Wear comfortable clothing and clothing appropriate for easy access to any Portacath or PICC line.  ? ?We strive to give you quality time with your provider. You may need to reschedule your appointment if you arrive late (15 or more minutes).  Arriving late affects you and other patients whose appointments are after yours.  Also, if you miss three or more appointments without notifying the office, you may be dismissed from the clinic at the provider?s discretion.    ?  ?For prescription refill requests, have your pharmacy contact our office and allow 72 hours for refills to be completed.   ? ?Today you received the following chemotherapy and/or immunotherapy agents Velcade    ?  ?To help prevent nausea and vomiting after your treatment, we encourage you to take your nausea medication as directed. ? ?BELOW ARE SYMPTOMS THAT SHOULD BE REPORTED IMMEDIATELY: ?*FEVER GREATER THAN 100.4 F (38 ?C) OR HIGHER ?*CHILLS OR SWEATING ?*NAUSEA AND VOMITING THAT IS NOT CONTROLLED WITH YOUR NAUSEA MEDICATION ?*UNUSUAL SHORTNESS OF BREATH ?*UNUSUAL BRUISING OR BLEEDING ?*URINARY PROBLEMS (pain or burning when urinating, or frequent urination) ?*BOWEL PROBLEMS (unusual diarrhea, constipation, pain near the anus) ?TENDERNESS IN MOUTH AND THROAT WITH OR WITHOUT PRESENCE OF ULCERS (sore throat, sores in mouth, or a toothache) ?UNUSUAL RASH, SWELLING OR PAIN  ?UNUSUAL VAGINAL DISCHARGE OR ITCHING  ? ?Items with * indicate a potential emergency and should be followed up as soon as possible or go to the Emergency Department if any problems should occur. ? ?Please show the CHEMOTHERAPY ALERT CARD or IMMUNOTHERAPY ALERT CARD at check-in to the  Emergency Department and triage nurse. ?Should you have questions after your visit or need to cancel or reschedule your appointment, please contact [facility]  [phone] and follow the prompts.  Office hours are 8:00 a.m. to 4:30 p.m. Monday - Friday. Please note that voicemails left after 4:00 p.m. may not be returned until the following business day.  We are closed weekends and major holidays. You have access to a nurse at all times for urgent questions. Please call the main number to the clinic [phone] and follow the prompts. ? ?For any non-urgent questions, you may also contact your provider using MyChart. We now offer e-Visits for anyone 36 and older to request care online for non-urgent symptoms. For details visit [email or web address]. ?  ?Also download the MyChart app! Go to the app store, search "MyChart", open the app, select , and log in with your MyChart username and password. ? ?Due to Covid, a mask is required upon entering the hospital/clinic. If you do not have a mask, one will be given to you upon arrival. For doctor visits, patients may have 1 support person aged 54 or older with them. For treatment visits, patients cannot have anyone with them due to current Covid guidelines and our immunocompromised population.  ?

## 2021-12-21 LAB — IGG, IGA, IGM
IgA: 173 mg/dL (ref 61–437)
IgG (Immunoglobin G), Serum: 1157 mg/dL (ref 603–1613)
IgM (Immunoglobulin M), Srm: 55 mg/dL (ref 15–143)

## 2021-12-21 LAB — KAPPA/LAMBDA LIGHT CHAINS
Kappa free light chain: 580.5 mg/L — ABNORMAL HIGH (ref 3.3–19.4)
Kappa, lambda light chain ratio: 22.94 — ABNORMAL HIGH (ref 0.26–1.65)
Lambda free light chains: 25.3 mg/L (ref 5.7–26.3)

## 2021-12-24 LAB — PROTEIN ELECTROPHORESIS, SERUM
A/G Ratio: 1.3 (ref 0.7–1.7)
Albumin ELP: 3.9 g/dL (ref 2.9–4.4)
Alpha-1-Globulin: 0.3 g/dL (ref 0.0–0.4)
Alpha-2-Globulin: 0.5 g/dL (ref 0.4–1.0)
Beta Globulin: 0.8 g/dL (ref 0.7–1.3)
Gamma Globulin: 1.2 g/dL (ref 0.4–1.8)
Globulin, Total: 2.9 g/dL (ref 2.2–3.9)
M-Spike, %: 0.6 g/dL — ABNORMAL HIGH
Total Protein ELP: 6.8 g/dL (ref 6.0–8.5)

## 2021-12-27 ENCOUNTER — Inpatient Hospital Stay: Payer: Medicare PPO

## 2021-12-27 VITALS — BP 118/71 | HR 56 | Temp 97.6°F | Resp 19

## 2021-12-27 DIAGNOSIS — D472 Monoclonal gammopathy: Secondary | ICD-10-CM

## 2021-12-27 DIAGNOSIS — Z5111 Encounter for antineoplastic chemotherapy: Secondary | ICD-10-CM | POA: Diagnosis not present

## 2021-12-27 LAB — COMPREHENSIVE METABOLIC PANEL
ALT: 16 U/L (ref 0–44)
AST: 22 U/L (ref 15–41)
Albumin: 4.3 g/dL (ref 3.5–5.0)
Alkaline Phosphatase: 70 U/L (ref 38–126)
Anion gap: 5 (ref 5–15)
BUN: 29 mg/dL — ABNORMAL HIGH (ref 8–23)
CO2: 31 mmol/L (ref 22–32)
Calcium: 9.1 mg/dL (ref 8.9–10.3)
Chloride: 101 mmol/L (ref 98–111)
Creatinine, Ser: 1.28 mg/dL — ABNORMAL HIGH (ref 0.61–1.24)
GFR, Estimated: 56 mL/min — ABNORMAL LOW (ref >60)
Glucose, Bld: 103 mg/dL — ABNORMAL HIGH (ref 70–99)
Potassium: 4.4 mmol/L (ref 3.5–5.1)
Sodium: 137 mmol/L (ref 135–145)
Total Bilirubin: 0.9 mg/dL (ref 0.3–1.2)
Total Protein: 6.7 g/dL (ref 6.5–8.1)

## 2021-12-27 LAB — CBC WITH DIFFERENTIAL/PLATELET
Abs Immature Granulocytes: 0.04 10*3/uL (ref 0.00–0.07)
Basophils Absolute: 0 10*3/uL (ref 0.0–0.1)
Basophils Relative: 0 %
Eosinophils Absolute: 0.3 10*3/uL (ref 0.0–0.5)
Eosinophils Relative: 5 %
HCT: 34.8 % — ABNORMAL LOW (ref 39.0–52.0)
Hemoglobin: 12.1 g/dL — ABNORMAL LOW (ref 13.0–17.0)
Immature Granulocytes: 1 %
Lymphocytes Relative: 28 %
Lymphs Abs: 1.3 10*3/uL (ref 0.7–4.0)
MCH: 34.5 pg — ABNORMAL HIGH (ref 26.0–34.0)
MCHC: 34.8 g/dL (ref 30.0–36.0)
MCV: 99.1 fL (ref 80.0–100.0)
Monocytes Absolute: 0.4 10*3/uL (ref 0.1–1.0)
Monocytes Relative: 8 %
Neutro Abs: 2.7 10*3/uL (ref 1.7–7.7)
Neutrophils Relative %: 58 %
Platelets: 86 10*3/uL — ABNORMAL LOW (ref 150–400)
RBC: 3.51 MIL/uL — ABNORMAL LOW (ref 4.22–5.81)
RDW: 12.6 % (ref 11.5–15.5)
WBC: 4.7 10*3/uL (ref 4.0–10.5)
nRBC: 0 % (ref 0.0–0.2)

## 2021-12-27 MED ORDER — BORTEZOMIB CHEMO SQ INJECTION 3.5 MG (2.5MG/ML)
1.3000 mg/m2 | Freq: Once | INTRAMUSCULAR | Status: AC
  Administered 2021-12-27: 2.5 mg via SUBCUTANEOUS
  Filled 2021-12-27: qty 1

## 2021-12-27 MED ORDER — DEXAMETHASONE 4 MG PO TABS
20.0000 mg | ORAL_TABLET | Freq: Once | ORAL | Status: AC
  Administered 2021-12-27: 20 mg via ORAL
  Filled 2021-12-27: qty 5

## 2021-12-27 NOTE — Progress Notes (Signed)
Reviewed pt labs with Dr. Marin Olp and pt ok to treat with platelets 86.

## 2021-12-27 NOTE — Patient Instructions (Signed)
[facility]  Discharge Instructions: Thank you for choosing [facility] to provide your oncology and hematology care.   If you have a lab appointment with the [facility], please go directly to the [facility] and check in at the registration area.  Wear comfortable clothing and clothing appropriate for easy access to any Portacath or PICC line.   We strive to give you quality time with your provider. You may need to reschedule your appointment if you arrive late (15 or more minutes).  Arriving late affects you and other patients whose appointments are after yours.  Also, if you miss three or more appointments without notifying the office, you may be dismissed from the clinic at the provider's discretion.      For prescription refill requests, have your pharmacy contact our office and allow 72 hours for refills to be completed.    Today you received the following chemotherapy and/or immunotherapy agents Velcade      To help prevent nausea and vomiting after your treatment, we encourage you to take your nausea medication as directed.  BELOW ARE SYMPTOMS THAT SHOULD BE REPORTED IMMEDIATELY: *FEVER GREATER THAN 100.4 F (38 C) OR HIGHER *CHILLS OR SWEATING *NAUSEA AND VOMITING THAT IS NOT CONTROLLED WITH YOUR NAUSEA MEDICATION *UNUSUAL SHORTNESS OF BREATH *UNUSUAL BRUISING OR BLEEDING *URINARY PROBLEMS (pain or burning when urinating, or frequent urination) *BOWEL PROBLEMS (unusual diarrhea, constipation, pain near the anus) TENDERNESS IN MOUTH AND THROAT WITH OR WITHOUT PRESENCE OF ULCERS (sore throat, sores in mouth, or a toothache) UNUSUAL RASH, SWELLING OR PAIN  UNUSUAL VAGINAL DISCHARGE OR ITCHING   Items with * indicate a potential emergency and should be followed up as soon as possible or go to the Emergency Department if any problems should occur.  Please show the CHEMOTHERAPY ALERT CARD or IMMUNOTHERAPY ALERT CARD at check-in to the  Emergency Department and triage nurse. Should you have questions after your visit or need to cancel or reschedule your appointment, please contact [facility]  [phone] and follow the prompts.  Office hours are 8:00 a.m. to 4:30 p.m. Monday - Friday. Please note that voicemails left after 4:00 p.m. may not be returned until the following business day.  We are closed weekends and major holidays. You have access to a nurse at all times for urgent questions. Please call the main number to the clinic [phone] and follow the prompts.  For any non-urgent questions, you may also contact your provider using MyChart. We now offer e-Visits for anyone 64 and older to request care online for non-urgent symptoms. For details visit [email or web address].   Also download the MyChart app! Go to the app store, search "MyChart", open the app, select [facility], and log in with your MyChart username and password.  Due to Covid, a mask is required upon entering the hospital/clinic. If you do not have a mask, one will be given to you upon arrival. For doctor visits, patients may have 1 support person aged 47 or older with them. For treatment visits, patients cannot have anyone with them due to current Covid guidelines and our immunocompromised population.

## 2022-01-03 ENCOUNTER — Inpatient Hospital Stay: Payer: Medicare PPO

## 2022-01-03 VITALS — BP 113/63 | HR 59 | Temp 97.5°F | Resp 18

## 2022-01-03 DIAGNOSIS — Z5111 Encounter for antineoplastic chemotherapy: Secondary | ICD-10-CM | POA: Diagnosis not present

## 2022-01-03 DIAGNOSIS — D472 Monoclonal gammopathy: Secondary | ICD-10-CM

## 2022-01-03 LAB — CBC WITH DIFFERENTIAL (CANCER CENTER ONLY)
Abs Immature Granulocytes: 0.04 10*3/uL (ref 0.00–0.07)
Basophils Absolute: 0 10*3/uL (ref 0.0–0.1)
Basophils Relative: 0 %
Eosinophils Absolute: 0.2 10*3/uL (ref 0.0–0.5)
Eosinophils Relative: 4 %
HCT: 33.3 % — ABNORMAL LOW (ref 39.0–52.0)
Hemoglobin: 11.8 g/dL — ABNORMAL LOW (ref 13.0–17.0)
Immature Granulocytes: 1 %
Lymphocytes Relative: 25 %
Lymphs Abs: 1.2 10*3/uL (ref 0.7–4.0)
MCH: 34.9 pg — ABNORMAL HIGH (ref 26.0–34.0)
MCHC: 35.4 g/dL (ref 30.0–36.0)
MCV: 98.5 fL (ref 80.0–100.0)
Monocytes Absolute: 0.3 10*3/uL (ref 0.1–1.0)
Monocytes Relative: 6 %
Neutro Abs: 3 10*3/uL (ref 1.7–7.7)
Neutrophils Relative %: 64 %
Platelet Count: 75 10*3/uL — ABNORMAL LOW (ref 150–400)
RBC: 3.38 MIL/uL — ABNORMAL LOW (ref 4.22–5.81)
RDW: 12.5 % (ref 11.5–15.5)
WBC Count: 4.7 10*3/uL (ref 4.0–10.5)
nRBC: 0 % (ref 0.0–0.2)

## 2022-01-03 LAB — CMP (CANCER CENTER ONLY)
ALT: 17 U/L (ref 0–44)
AST: 24 U/L (ref 15–41)
Albumin: 4.1 g/dL (ref 3.5–5.0)
Alkaline Phosphatase: 70 U/L (ref 38–126)
Anion gap: 6 (ref 5–15)
BUN: 29 mg/dL — ABNORMAL HIGH (ref 8–23)
CO2: 29 mmol/L (ref 22–32)
Calcium: 9.1 mg/dL (ref 8.9–10.3)
Chloride: 103 mmol/L (ref 98–111)
Creatinine: 1.25 mg/dL — ABNORMAL HIGH (ref 0.61–1.24)
GFR, Estimated: 57 mL/min — ABNORMAL LOW (ref >60)
Glucose, Bld: 146 mg/dL — ABNORMAL HIGH (ref 70–99)
Potassium: 4.2 mmol/L (ref 3.5–5.1)
Sodium: 138 mmol/L (ref 135–145)
Total Bilirubin: 1 mg/dL (ref 0.3–1.2)
Total Protein: 6.5 g/dL (ref 6.5–8.1)

## 2022-01-03 MED ORDER — BORTEZOMIB CHEMO SQ INJECTION 3.5 MG (2.5MG/ML)
1.3000 mg/m2 | Freq: Once | INTRAMUSCULAR | Status: AC
  Administered 2022-01-03: 2.5 mg via SUBCUTANEOUS
  Filled 2022-01-03: qty 1

## 2022-01-03 MED ORDER — DEXAMETHASONE 4 MG PO TABS
20.0000 mg | ORAL_TABLET | Freq: Once | ORAL | Status: AC
  Administered 2022-01-03: 20 mg via ORAL
  Filled 2022-01-03: qty 5

## 2022-01-03 NOTE — Progress Notes (Signed)
Ok to treat with platelet count 75 per Dr. Marin Olp.

## 2022-01-17 ENCOUNTER — Ambulatory Visit: Payer: Medicare PPO | Admitting: Hematology & Oncology

## 2022-01-17 ENCOUNTER — Inpatient Hospital Stay: Payer: Medicare PPO | Attending: Hematology & Oncology

## 2022-01-17 ENCOUNTER — Inpatient Hospital Stay: Payer: Medicare PPO

## 2022-01-17 ENCOUNTER — Other Ambulatory Visit: Payer: Self-pay

## 2022-01-17 ENCOUNTER — Other Ambulatory Visit: Payer: Self-pay | Admitting: Oncology

## 2022-01-17 ENCOUNTER — Encounter: Payer: Self-pay | Admitting: Hematology & Oncology

## 2022-01-17 ENCOUNTER — Ambulatory Visit: Payer: Medicare PPO

## 2022-01-17 ENCOUNTER — Other Ambulatory Visit: Payer: Medicare PPO

## 2022-01-17 ENCOUNTER — Inpatient Hospital Stay: Payer: Medicare PPO | Admitting: Hematology & Oncology

## 2022-01-17 VITALS — BP 110/70 | HR 67 | Temp 97.5°F | Resp 18 | Ht 66.0 in | Wt 188.1 lb

## 2022-01-17 DIAGNOSIS — Z5111 Encounter for antineoplastic chemotherapy: Secondary | ICD-10-CM | POA: Insufficient documentation

## 2022-01-17 DIAGNOSIS — C9000 Multiple myeloma not having achieved remission: Secondary | ICD-10-CM

## 2022-01-17 DIAGNOSIS — D472 Monoclonal gammopathy: Secondary | ICD-10-CM

## 2022-01-17 LAB — CMP (CANCER CENTER ONLY)
ALT: 16 U/L (ref 0–44)
AST: 24 U/L (ref 15–41)
Albumin: 4.3 g/dL (ref 3.5–5.0)
Alkaline Phosphatase: 75 U/L (ref 38–126)
Anion gap: 7 (ref 5–15)
BUN: 25 mg/dL — ABNORMAL HIGH (ref 8–23)
CO2: 31 mmol/L (ref 22–32)
Calcium: 9.3 mg/dL (ref 8.9–10.3)
Chloride: 102 mmol/L (ref 98–111)
Creatinine: 1.2 mg/dL (ref 0.61–1.24)
GFR, Estimated: >60 mL/min (ref >60)
Glucose, Bld: 103 mg/dL — ABNORMAL HIGH (ref 70–99)
Potassium: 4.1 mmol/L (ref 3.5–5.1)
Sodium: 140 mmol/L (ref 135–145)
Total Bilirubin: 1 mg/dL (ref 0.3–1.2)
Total Protein: 6.7 g/dL (ref 6.5–8.1)

## 2022-01-17 LAB — CBC WITH DIFFERENTIAL (CANCER CENTER ONLY)
Abs Immature Granulocytes: 0.04 10*3/uL (ref 0.00–0.07)
Basophils Absolute: 0 10*3/uL (ref 0.0–0.1)
Basophils Relative: 0 %
Eosinophils Absolute: 0.2 10*3/uL (ref 0.0–0.5)
Eosinophils Relative: 3 %
HCT: 35.7 % — ABNORMAL LOW (ref 39.0–52.0)
Hemoglobin: 12.3 g/dL — ABNORMAL LOW (ref 13.0–17.0)
Immature Granulocytes: 1 %
Lymphocytes Relative: 23 %
Lymphs Abs: 1 10*3/uL (ref 0.7–4.0)
MCH: 34.2 pg — ABNORMAL HIGH (ref 26.0–34.0)
MCHC: 34.5 g/dL (ref 30.0–36.0)
MCV: 99.2 fL (ref 80.0–100.0)
Monocytes Absolute: 0.3 10*3/uL (ref 0.1–1.0)
Monocytes Relative: 6 %
Neutro Abs: 2.9 10*3/uL (ref 1.7–7.7)
Neutrophils Relative %: 67 %
Platelet Count: 82 10*3/uL — ABNORMAL LOW (ref 150–400)
RBC: 3.6 MIL/uL — ABNORMAL LOW (ref 4.22–5.81)
RDW: 12.3 % (ref 11.5–15.5)
WBC Count: 4.4 10*3/uL (ref 4.0–10.5)
nRBC: 0 % (ref 0.0–0.2)

## 2022-01-17 LAB — LACTATE DEHYDROGENASE: LDH: 248 U/L — ABNORMAL HIGH (ref 98–192)

## 2022-01-17 MED ORDER — BORTEZOMIB CHEMO SQ INJECTION 3.5 MG (2.5MG/ML)
1.3000 mg/m2 | Freq: Once | INTRAMUSCULAR | Status: AC
  Administered 2022-01-17: 2.5 mg via SUBCUTANEOUS
  Filled 2022-01-17: qty 1

## 2022-01-17 MED ORDER — DEXAMETHASONE 4 MG PO TABS
20.0000 mg | ORAL_TABLET | Freq: Once | ORAL | Status: AC
  Administered 2022-01-17: 20 mg via ORAL
  Filled 2022-01-17: qty 5

## 2022-01-17 NOTE — Progress Notes (Signed)
OK to treat with platelets value from today per Dr. Marin Olp.

## 2022-01-17 NOTE — Patient Instructions (Signed)
[facility]  Discharge Instructions: Thank you for choosing [facility] to provide your oncology and hematology care.   If you have a lab appointment with the [facility], please go directly to the [facility] and check in at the registration area.  Wear comfortable clothing and clothing appropriate for easy access to any Portacath or PICC line.   We strive to give you quality time with your provider. You may need to reschedule your appointment if you arrive late (15 or more minutes).  Arriving late affects you and other patients whose appointments are after yours.  Also, if you miss three or more appointments without notifying the office, you may be dismissed from the clinic at the provider's discretion.      For prescription refill requests, have your pharmacy contact our office and allow 72 hours for refills to be completed.    Today you received the following chemotherapy and/or immunotherapy agents Velcade.      To help prevent nausea and vomiting after your treatment, we encourage you to take your nausea medication as directed.  BELOW ARE SYMPTOMS THAT SHOULD BE REPORTED IMMEDIATELY: *FEVER GREATER THAN 100.4 F (38 C) OR HIGHER *CHILLS OR SWEATING *NAUSEA AND VOMITING THAT IS NOT CONTROLLED WITH YOUR NAUSEA MEDICATION *UNUSUAL SHORTNESS OF BREATH *UNUSUAL BRUISING OR BLEEDING *URINARY PROBLEMS (pain or burning when urinating, or frequent urination) *BOWEL PROBLEMS (unusual diarrhea, constipation, pain near the anus) TENDERNESS IN MOUTH AND THROAT WITH OR WITHOUT PRESENCE OF ULCERS (sore throat, sores in mouth, or a toothache) UNUSUAL RASH, SWELLING OR PAIN  UNUSUAL VAGINAL DISCHARGE OR ITCHING   Items with * indicate a potential emergency and should be followed up as soon as possible or go to the Emergency Department if any problems should occur.  Please show the CHEMOTHERAPY ALERT CARD or IMMUNOTHERAPY ALERT CARD at check-in to the  Emergency Department and triage nurse. Should you have questions after your visit or need to cancel or reschedule your appointment, please contact [facility]  [phone] and follow the prompts.  Office hours are 8:00 a.m. to 4:30 p.m. Monday - Friday. Please note that voicemails left after 4:00 p.m. may not be returned until the following business day.  We are closed weekends and major holidays. You have access to a nurse at all times for urgent questions. Please call the main number to the clinic [phone] and follow the prompts.  For any non-urgent questions, you may also contact your provider using MyChart. We now offer e-Visits for anyone 71 and older to request care online for non-urgent symptoms. For details visit [email or web address].   Also download the MyChart app! Go to the app store, search "MyChart", open the app, select [facility], and log in with your MyChart username and password.  Due to Covid, a mask is required upon entering the hospital/clinic. If you do not have a mask, one will be given to you upon arrival. For doctor visits, patients may have 1 support person aged 33 or older with them. For treatment visits, patients cannot have anyone with them due to current Covid guidelines and our immunocompromised population.

## 2022-01-17 NOTE — Progress Notes (Signed)
Hematology and Oncology Follow Up Visit  Riley Eaton [id number] 04-07-1941 81 y.o. 01/17/2022   Principle Diagnosis:  IgG Kappa myeloma  -- +1q and t(11:14)  Current Therapy:   Velcade/Decadron -- ( 3 wk on/1 wk off) -- s/p cycle #4 -- start on 08/24/2021     Interim History:  Riley Eaton is back for follow-up.  He feels well.  He has had no complaints.  He had a pretty quiet Memorial Day weekend.  The issue that we might have is at his Kappa light chain has been going up slowly but surely.  When we last saw him, the Kappa light chain was 580 mg/L.  In addition, the monoclonal spike has been slowly going up.  It may monoclonal spike was 0.6 g/dL.  The IgG level was 1157 mg/dL.  I told him that if the level continues to go up, we can have to add daratumumab to his protocol.  I think this would be a reasonable way of trying to help him.  He has had no issues with bowels or bladder.  He has had no bleeding.  He has had no rashes.  There has been no leg swelling.  He has had no cough or shortness of breath.  He has had no headache.  There has been no cough or shortness of breath.  There has been no nausea or vomiting.  There has been no change in bowel or bladder habits.  He takes something to help with constipation.  He has had no bony pain.  There has been no leg swelling.  Currently, I would say his performance status is probably ECOG 1.   Medications:  Current Outpatient Medications:    acetaminophen (TYLENOL) 325 MG tablet, Take 2 tablets (650 mg total) by mouth every 6 (six) hours as needed for mild pain (or temp > 100)., Disp: 20 tablet, Rfl: 0   acyclovir (ZOVIRAX) 400 MG tablet, TAKE 1 TABLET BY MOUTH TWICE A DAY, Disp: 180 tablet, Rfl: 1   allopurinol (ZYLOPRIM) 100 MG tablet, TAKE 1 TABLET BY MOUTH EVERY DAY (Patient taking differently: Take 100 mg by mouth daily.), Disp: 90 tablet, Rfl: 1   aspirin 81 MG chewable tablet, Chew 1 tablet (81 mg total) by mouth daily., Disp:  30 tablet, Rfl: 0   atorvastatin (LIPITOR) 40 MG tablet, Take 1 tablet (40 mg total) by mouth daily at 6 PM. (Patient taking differently: Take 40 mg by mouth at bedtime.), Disp: 30 tablet, Rfl: 0   carvedilol (COREG) 6.25 MG tablet, Take 1 tablet by mouth 2 (two) times daily., Disp: , Rfl:    colchicine 0.6 MG tablet, , Disp: , Rfl:    diphenhydramine-acetaminophen (TYLENOL PM) 25-500 MG TABS tablet, Take 1 tablet by mouth at bedtime., Disp: , Rfl:    ferrous sulfate 325 (65 FE) MG EC tablet, Take 325 mg by mouth daily., Disp: , Rfl:    finasteride (PROSCAR) 5 MG tablet, Take 1 tablet (5 mg total) by mouth daily. For urination, Disp: 90 tablet, Rfl: 3   furosemide (LASIX) 40 MG tablet, TAKE 1 TABLET BY MOUTH EVERY DAY, Disp: 90 tablet, Rfl: 3   gabapentin (NEURONTIN) 300 MG capsule, TAKE 2 CAPSULES BY MOUTH THREE TIMES A DAY, Disp: , Rfl:    HYDROcodone-acetaminophen (NORCO/VICODIN) 5-325 MG tablet, Take 1 tablet by mouth 4 (four) times daily., Disp: , Rfl:    lidocaine-prilocaine (EMLA) cream, Apply to affected area once, Disp: 30 g, Rfl: 3   LORazepam (ATIVAN)  0.5 MG tablet, Take 1 tablet (0.5 mg total) by mouth every 6 (six) hours as needed (Nausea or vomiting)., Disp: 30 tablet, Rfl: 0   losartan (COZAAR) 25 MG tablet, Take 25 mg by mouth daily., Disp: , Rfl:    MOVANTIK 25 MG TABS tablet, Take 25 mg by mouth daily., Disp: , Rfl:    OLANZapine (ZYPREXA) 5 MG tablet, Take 1 tablet (5 mg total) by mouth daily., Disp: 30 tablet, Rfl: 0   ondansetron (ZOFRAN ODT) 4 MG disintegrating tablet, Take 1 tablet (4 mg total) by mouth every 8 (eight) hours as needed for nausea or vomiting., Disp: 20 tablet, Rfl: 0   ondansetron (ZOFRAN) 8 MG tablet, Take 1 tablet (8 mg total) by mouth 2 (two) times daily as needed (Nausea or vomiting)., Disp: 30 tablet, Rfl: 1   pantoprazole (PROTONIX) 40 MG tablet, Take 1 tablet (40 mg total) by mouth at bedtime. (Patient taking differently: Take 40 mg by mouth daily.),  Disp: 30 tablet, Rfl: 0   polyethylene glycol (MIRALAX / GLYCOLAX) 17 g packet, Take 17 g by mouth daily. (Patient taking differently: Take 17 g by mouth daily as needed (constipation).), Disp: 14 each, Rfl: 0   pregabalin (LYRICA) 150 MG capsule, Take 150 mg by mouth 2 (two) times daily., Disp: , Rfl:    prochlorperazine (COMPAZINE) 10 MG tablet, Take 1 tablet (10 mg total) by mouth every 6 (six) hours as needed (Nausea or vomiting)., Disp: 30 tablet, Rfl: 1   traZODone (DESYREL) 50 MG tablet, Take 50 mg by mouth at bedtime., Disp: , Rfl:    vitamin B-12 (CYANOCOBALAMIN) 500 MCG tablet, Take 500 mcg by mouth daily., Disp: , Rfl:   Allergies: No Known Allergies  Past Medical History, Surgical history, Social history, and Family History were reviewed and updated.  Review of Systems: Review of Systems  Constitutional: Negative.   HENT:  Negative.    Eyes: Negative.   Respiratory: Negative.    Cardiovascular: Negative.   Gastrointestinal: Negative.   Endocrine: Negative.   Genitourinary: Negative.    Musculoskeletal: Negative.   Skin: Negative.   Neurological: Negative.   Hematological: Negative.   Psychiatric/Behavioral: Negative.      Physical Exam:  height is '5\' 6"'$  (1.676 m) and weight is 188 lb 1.9 oz (85.3 kg). His oral temperature is 97.5 F (36.4 C) (abnormal). His blood pressure is 110/70 and his pulse is 67. His respiration is 18 and oxygen saturation is 99%.   Wt Readings from Last 3 Encounters:  01/17/22 188 lb 1.9 oz (85.3 kg)  12/20/21 189 lb (85.7 kg)  11/20/21 187 lb 1.9 oz (84.9 kg)    Physical Exam Vitals reviewed.  HENT:     Head: Normocephalic and atraumatic.  Eyes:     Pupils: Pupils are equal, round, and reactive to light.  Cardiovascular:     Rate and Rhythm: Normal rate and regular rhythm.     Heart sounds: Normal heart sounds.  Pulmonary:     Effort: Pulmonary effort is normal.     Breath sounds: Normal breath sounds.  Abdominal:     General:  Bowel sounds are normal.     Palpations: Abdomen is soft.  Musculoskeletal:        General: No tenderness or deformity. Normal range of motion.     Cervical back: Normal range of motion.  Lymphadenopathy:     Cervical: No cervical adenopathy.  Skin:    General: Skin is warm and dry.  Findings: No erythema or rash.  Neurological:     Mental Status: He is alert and oriented to person, place, and time.  Psychiatric:        Behavior: Behavior normal.        Thought Content: Thought content normal.        Judgment: Judgment normal.    Lab Results  Component Value Date   WBC 4.4 01/17/2022   HGB 12.3 (L) 01/17/2022   HCT 35.7 (L) 01/17/2022   MCV 99.2 01/17/2022   PLT 82 (L) 01/17/2022     Chemistry      Component Value Date/Time   NA 140 01/17/2022 1011   NA 142 10/15/2019 1500   K 4.1 01/17/2022 1011   CL 102 01/17/2022 1011   CO2 31 01/17/2022 1011   BUN 25 (H) 01/17/2022 1011   BUN 25 10/15/2019 1500   CREATININE 1.20 01/17/2022 1011      Component Value Date/Time   CALCIUM 9.3 01/17/2022 1011   ALKPHOS 75 01/17/2022 1011   AST 24 01/17/2022 1011   ALT 16 01/17/2022 1011   BILITOT 1.0 01/17/2022 1011      Impression and Plan: Riley Eaton is a very nice 81 year old white male.  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  For right now, I do not want to make a change in his protocol.  However, if the Kappa light chain or the monoclonal spike are higher, going to have to make a change.  Again I think adding daratumumab would be reasonable.  Riley Eaton has done a great job.  He is doing all that we have asked him to do.  He is a real tough guy.   [name], MD 6/8/202311:40 AM

## 2022-01-18 ENCOUNTER — Telehealth: Payer: Self-pay | Admitting: *Deleted

## 2022-01-18 LAB — KAPPA/LAMBDA LIGHT CHAINS
Kappa free light chain: 476.3 mg/L — ABNORMAL HIGH (ref 3.3–19.4)
Kappa, lambda light chain ratio: 21.08 — ABNORMAL HIGH (ref 0.26–1.65)
Lambda free light chains: 22.6 mg/L (ref 5.7–26.3)

## 2022-01-18 LAB — IGG, IGA, IGM
IgA: 155 mg/dL (ref 61–437)
IgG (Immunoglobin G), Serum: 1108 mg/dL (ref 603–1613)
IgM (Immunoglobulin M), Srm: 54 mg/dL (ref 15–143)

## 2022-01-18 NOTE — Telephone Encounter (Signed)
As noted below by Dr. Marin Olp, I informed  the patient that the Kappa Light chain is better. As such, we do not need to make any change in your protocol. He verbalized understanding.

## 2022-01-18 NOTE — Telephone Encounter (Signed)
-----   Message from [name], MD sent at 01/18/2022  3:53 PM EDT ----- Call and let him know that the Kappa light chain is actually better.  As such, we do not have to make any change in his protocol.  Thanks.  Laurey Arrow

## 2022-01-23 LAB — IMMUNOFIXATION REFLEX, SERUM
IgA: 169 mg/dL (ref 61–437)
IgG (Immunoglobin G), Serum: 1107 mg/dL (ref 603–1613)
IgM (Immunoglobulin M), Srm: 55 mg/dL (ref 15–143)

## 2022-01-23 LAB — PROTEIN ELECTROPHORESIS, SERUM, WITH REFLEX
A/G Ratio: 1.6 (ref 0.7–1.7)
Albumin ELP: 4 g/dL (ref 2.9–4.4)
Alpha-1-Globulin: 0.2 g/dL (ref 0.0–0.4)
Alpha-2-Globulin: 0.5 g/dL (ref 0.4–1.0)
Beta Globulin: 0.7 g/dL (ref 0.7–1.3)
Gamma Globulin: 1 g/dL (ref 0.4–1.8)
Globulin, Total: 2.5 g/dL (ref 2.2–3.9)
M-Spike, %: 0.3 g/dL — ABNORMAL HIGH
SPEP Interpretation: 0
Total Protein ELP: 6.5 g/dL (ref 6.0–8.5)

## 2022-01-24 ENCOUNTER — Inpatient Hospital Stay: Payer: Medicare PPO

## 2022-01-24 ENCOUNTER — Other Ambulatory Visit: Payer: Self-pay | Admitting: Lab

## 2022-01-24 VITALS — BP 103/66 | HR 64 | Temp 98.0°F | Resp 19

## 2022-01-24 DIAGNOSIS — D472 Monoclonal gammopathy: Secondary | ICD-10-CM

## 2022-01-24 DIAGNOSIS — Z5111 Encounter for antineoplastic chemotherapy: Secondary | ICD-10-CM | POA: Diagnosis not present

## 2022-01-24 LAB — COMPREHENSIVE METABOLIC PANEL
ALT: 15 U/L (ref 0–44)
AST: 22 U/L (ref 15–41)
Albumin: 4.2 g/dL (ref 3.5–5.0)
Alkaline Phosphatase: 64 U/L (ref 38–126)
Anion gap: 7 (ref 5–15)
BUN: 25 mg/dL — ABNORMAL HIGH (ref 8–23)
CO2: 30 mmol/L (ref 22–32)
Calcium: 9.1 mg/dL (ref 8.9–10.3)
Chloride: 103 mmol/L (ref 98–111)
Creatinine, Ser: 1.24 mg/dL (ref 0.61–1.24)
GFR, Estimated: 58 mL/min — ABNORMAL LOW (ref >60)
Glucose, Bld: 115 mg/dL — ABNORMAL HIGH (ref 70–99)
Potassium: 4.4 mmol/L (ref 3.5–5.1)
Sodium: 140 mmol/L (ref 135–145)
Total Bilirubin: 0.8 mg/dL (ref 0.3–1.2)
Total Protein: 6.6 g/dL (ref 6.5–8.1)

## 2022-01-24 LAB — CBC WITH DIFFERENTIAL/PLATELET
Abs Immature Granulocytes: 0.05 10*3/uL (ref 0.00–0.07)
Basophils Absolute: 0 10*3/uL (ref 0.0–0.1)
Basophils Relative: 0 %
Eosinophils Absolute: 0.2 10*3/uL (ref 0.0–0.5)
Eosinophils Relative: 4 %
HCT: 34 % — ABNORMAL LOW (ref 39.0–52.0)
Hemoglobin: 12 g/dL — ABNORMAL LOW (ref 13.0–17.0)
Immature Granulocytes: 1 %
Lymphocytes Relative: 22 %
Lymphs Abs: 0.9 10*3/uL (ref 0.7–4.0)
MCH: 34.9 pg — ABNORMAL HIGH (ref 26.0–34.0)
MCHC: 35.3 g/dL (ref 30.0–36.0)
MCV: 98.8 fL (ref 80.0–100.0)
Monocytes Absolute: 0.3 10*3/uL (ref 0.1–1.0)
Monocytes Relative: 8 %
Neutro Abs: 2.8 10*3/uL (ref 1.7–7.7)
Neutrophils Relative %: 65 %
Platelets: 88 10*3/uL — ABNORMAL LOW (ref 150–400)
RBC: 3.44 MIL/uL — ABNORMAL LOW (ref 4.22–5.81)
RDW: 12.6 % (ref 11.5–15.5)
WBC: 4.2 10*3/uL (ref 4.0–10.5)
nRBC: 0 % (ref 0.0–0.2)

## 2022-01-24 MED ORDER — DEXAMETHASONE 4 MG PO TABS
20.0000 mg | ORAL_TABLET | Freq: Once | ORAL | Status: AC
  Administered 2022-01-24: 20 mg via ORAL
  Filled 2022-01-24: qty 5

## 2022-01-24 MED ORDER — BORTEZOMIB CHEMO SQ INJECTION 3.5 MG (2.5MG/ML)
1.3000 mg/m2 | Freq: Once | INTRAMUSCULAR | Status: AC
  Administered 2022-01-24: 2.5 mg via SUBCUTANEOUS
  Filled 2022-01-24: qty 1

## 2022-01-24 NOTE — Patient Instructions (Signed)
[facility]  Discharge Instructions: Thank you for choosing [facility] to provide your oncology and hematology care.   If you have a lab appointment with the [facility], please go directly to the [facility] and check in at the registration area.  Wear comfortable clothing and clothing appropriate for easy access to any Portacath or PICC line.   We strive to give you quality time with your provider. You may need to reschedule your appointment if you arrive late (15 or more minutes).  Arriving late affects you and other patients whose appointments are after yours.  Also, if you miss three or more appointments without notifying the office, you may be dismissed from the clinic at the provider's discretion.      For prescription refill requests, have your pharmacy contact our office and allow 72 hours for refills to be completed.    Today you received the following chemotherapy and/or immunotherapy agents Velcade.      To help prevent nausea and vomiting after your treatment, we encourage you to take your nausea medication as directed.  BELOW ARE SYMPTOMS THAT SHOULD BE REPORTED IMMEDIATELY: *FEVER GREATER THAN 100.4 F (38 C) OR HIGHER *CHILLS OR SWEATING *NAUSEA AND VOMITING THAT IS NOT CONTROLLED WITH YOUR NAUSEA MEDICATION *UNUSUAL SHORTNESS OF BREATH *UNUSUAL BRUISING OR BLEEDING *URINARY PROBLEMS (pain or burning when urinating, or frequent urination) *BOWEL PROBLEMS (unusual diarrhea, constipation, pain near the anus) TENDERNESS IN MOUTH AND THROAT WITH OR WITHOUT PRESENCE OF ULCERS (sore throat, sores in mouth, or a toothache) UNUSUAL RASH, SWELLING OR PAIN  UNUSUAL VAGINAL DISCHARGE OR ITCHING   Items with * indicate a potential emergency and should be followed up as soon as possible or go to the Emergency Department if any problems should occur.  Please show the CHEMOTHERAPY ALERT CARD or IMMUNOTHERAPY ALERT CARD at check-in to the  Emergency Department and triage nurse. Should you have questions after your visit or need to cancel or reschedule your appointment, please contact [facility]  [phone] and follow the prompts.  Office hours are 8:00 a.m. to 4:30 p.m. Monday - Friday. Please note that voicemails left after 4:00 p.m. may not be returned until the following business day.  We are closed weekends and major holidays. You have access to a nurse at all times for urgent questions. Please call the main number to the clinic [phone] and follow the prompts.  For any non-urgent questions, you may also contact your provider using MyChart. We now offer e-Visits for anyone 82 and older to request care online for non-urgent symptoms. For details visit [email or web address].   Also download the MyChart app! Go to the app store, search "MyChart", open the app, select [facility], and log in with your MyChart username and password.  Due to Covid, a mask is required upon entering the hospital/clinic. If you do not have a mask, one will be given to you upon arrival. For doctor visits, patients may have 1 support person aged 7 or older with them. For treatment visits, patients cannot have anyone with them due to current Covid guidelines and our immunocompromised population.

## 2022-01-30 ENCOUNTER — Ambulatory Visit: Payer: Medicare PPO | Admitting: Cardiovascular Disease

## 2022-01-30 ENCOUNTER — Encounter: Payer: Self-pay | Admitting: Cardiovascular Disease

## 2022-01-30 VITALS — BP 110/60 | HR 53 | Ht 67.0 in | Wt 190.8 lb

## 2022-01-30 DIAGNOSIS — E782 Mixed hyperlipidemia: Secondary | ICD-10-CM

## 2022-01-30 DIAGNOSIS — I5022 Chronic systolic (congestive) heart failure: Secondary | ICD-10-CM

## 2022-01-30 DIAGNOSIS — I2510 Atherosclerotic heart disease of native coronary artery without angina pectoris: Secondary | ICD-10-CM

## 2022-01-30 DIAGNOSIS — I7121 Aneurysm of the ascending aorta, without rupture: Secondary | ICD-10-CM

## 2022-01-30 NOTE — Assessment & Plan Note (Signed)
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

## 2022-01-30 NOTE — Assessment & Plan Note (Signed)
History of hyperlipidemia on statin therapy lipid profile performed 09/03/2019 revealing total cholesterol 103, LDL 32 and HDL 58.

## 2022-01-30 NOTE — Patient Instructions (Signed)

## 2022-01-30 NOTE — Assessment & Plan Note (Signed)
Moderate LV dysfunction with an EF by 2D echo performed 03/20/2020 of 40 to 45% with global hypokinesia and grade 1 diastolic dysfunction.  He is on carvedilol and losartan.

## 2022-01-30 NOTE — Progress Notes (Signed)
01/30/2022 Riley Eaton   Feb 28, 1941  [id number]  Primary Physician Aletha Halim., PA-C Primary Cardiologist: Lorretta Harp MD [facility], [place], [place]  HPI:  Riley Eaton is a 81 y.o.  mildly overweight married Caucasian male  father of 5, grandfather of 4 grandchildren. Retired from being in the [employer]. He is referred by Leonia Reader PA-C at [facility] for cardiovascular evaluation because of an episode of shortness of breath, and an incidentally noted thoracic aortic aneurysm. I last saw him in the office 03/20/2021.[name] He basically has no cardiac risk factors. He's never had a heart attack or stroke. He denies chest pain or shortness of breath. A Myoview stress test was performed 10/20/15 which was entirely normal. A CT scan revealed a thoracic aortic aneurysm measuring 4.1 cm.   He  was admitted in early December for altered mental status.  He did have a troponin of 2100 and EKG changes.  2D echo revealed EF of 30% which is new compared to the EF on Myoview stress test performed 10/20/2015 which was normal.  He was not a candidate for cardiac catheterization because of severe renal insufficiency nor he is a candidate for coronary CTA because of this as well.  He gets occasional although infrequent chest pain.  2D echo did not comment on thoracic aortic aneurysm.   Since I saw him a month ago he is noticed increasing dyspnea on exertion and orthopnea.  He denies chest pain.  He was scheduled for coronary CTA but this will be canceled because of his severe renal insufficiency.   He underwent TURP by Dr. Jeffie Pollock 10/26/2019 and since that time his renal function has normalized.  He denies chest pain or shortness of breath.  His echo performed 07/14/2019 did not mention dilatation of his ascending thoracic aorta, although recent echo performed 03/20/2020 suggested his ascending thoracic aorta measured 41 mm.   He was hospitalized in late December 2021 with abdominal pain and  ultimately underwent cholecystectomy.  He is felt well since that time.  He denies chest pain or shortness of breath.  Since I saw him 1 year ago he has done well.  He complains of some dyspnea but denies chest pain.   Current Meds  Medication Sig   acetaminophen (TYLENOL) 325 MG tablet Take 2 tablets (650 mg total) by mouth every 6 (six) hours as needed for mild pain (or temp > 100).   acyclovir (ZOVIRAX) 400 MG tablet TAKE 1 TABLET BY MOUTH TWICE A DAY   allopurinol (ZYLOPRIM) 100 MG tablet TAKE 1 TABLET BY MOUTH EVERY DAY (Patient taking differently: Take 100 mg by mouth daily.)   aspirin 81 MG chewable tablet Chew 1 tablet (81 mg total) by mouth daily.   atorvastatin (LIPITOR) 40 MG tablet Take 1 tablet (40 mg total) by mouth daily at 6 PM. (Patient taking differently: Take 40 mg by mouth at bedtime.)   carvedilol (COREG) 6.25 MG tablet Take 1 tablet by mouth 2 (two) times daily.   colchicine 0.6 MG tablet    diphenhydramine-acetaminophen (TYLENOL PM) 25-500 MG TABS tablet Take 1 tablet by mouth at bedtime.   ferrous sulfate 325 (65 FE) MG EC tablet Take 325 mg by mouth daily.   finasteride (PROSCAR) 5 MG tablet Take 1 tablet (5 mg total) by mouth daily. For urination   furosemide (LASIX) 40 MG tablet TAKE 1 TABLET BY MOUTH EVERY DAY   gabapentin (NEURONTIN) 300 MG capsule TAKE 2 CAPSULES BY MOUTH  THREE TIMES A DAY   HYDROcodone-acetaminophen (NORCO/VICODIN) 5-325 MG tablet Take 1 tablet by mouth 4 (four) times daily.   lidocaine-prilocaine (EMLA) cream Apply to affected area once   LORazepam (ATIVAN) 0.5 MG tablet Take 1 tablet (0.5 mg total) by mouth every 6 (six) hours as needed (Nausea or vomiting).   losartan (COZAAR) 25 MG tablet Take 25 mg by mouth daily.   MOVANTIK 25 MG TABS tablet Take 25 mg by mouth daily.   OLANZapine (ZYPREXA) 5 MG tablet Take 1 tablet (5 mg total) by mouth daily.   ondansetron (ZOFRAN ODT) 4 MG disintegrating tablet Take 1 tablet (4 mg total) by mouth every  8 (eight) hours as needed for nausea or vomiting.   ondansetron (ZOFRAN) 8 MG tablet Take 1 tablet (8 mg total) by mouth 2 (two) times daily as needed (Nausea or vomiting).   pantoprazole (PROTONIX) 40 MG tablet Take 1 tablet (40 mg total) by mouth at bedtime. (Patient taking differently: Take 40 mg by mouth daily.)   polyethylene glycol (MIRALAX / GLYCOLAX) 17 g packet Take 17 g by mouth daily. (Patient taking differently: Take 17 g by mouth daily as needed (constipation).)   pregabalin (LYRICA) 150 MG capsule Take 150 mg by mouth 2 (two) times daily.   prochlorperazine (COMPAZINE) 10 MG tablet Take 1 tablet (10 mg total) by mouth every 6 (six) hours as needed (Nausea or vomiting).   traZODone (DESYREL) 50 MG tablet Take 50 mg by mouth at bedtime.   vitamin B-12 (CYANOCOBALAMIN) 500 MCG tablet Take 500 mcg by mouth daily.     No Known Allergies  Social History   Socioeconomic History   Marital status: Married    Spouse name: Not on file   Number of children: 3   Years of education: 12   Highest education level: Not on file  Occupational History   Occupation: retired  Tobacco Use   Smoking status: Former    Packs/day: 1.00    Years: 20.00    Total pack years: 20.00    Types: Cigarettes    Quit date: 1976    Years since quitting: 47.5   Smokeless tobacco: Former    Quit date: 12/12/1979   Tobacco comments:    quit 45 years ago  Vaping Use   Vaping Use: Never used  Substance and Sexual Activity   Alcohol use: No   Drug use: No   Sexual activity: Not on file  Other Topics Concern   Not on file  Social History Narrative   Lives with wife   Caffeine use: Drinks 6 cups coffee per day   Right handed    Social Determinants of Health   Financial Resource Strain: Not on file  Food Insecurity: Not on file  Transportation Needs: Not on file  Physical Activity: Not on file  Stress: Not on file  Social Connections: Not on file  Intimate Partner Violence: Not on file      Review of Systems: General: negative for chills, fever, night sweats or weight changes.  Cardiovascular: negative for chest pain, dyspnea on exertion, edema, orthopnea, palpitations, paroxysmal nocturnal dyspnea or shortness of breath Dermatological: negative for rash Respiratory: negative for cough or wheezing Urologic: negative for hematuria Abdominal: negative for nausea, vomiting, diarrhea, bright red blood per rectum, melena, or hematemesis Neurologic: negative for visual changes, syncope, or dizziness All other systems reviewed and are otherwise negative except as noted above.    Blood pressure 110/60, pulse (!) 53, height '5\' 7"'$  (  1.702 m), weight 190 lb 12.8 oz (86.5 kg), SpO2 98 %.  General appearance: alert and no distress Neck: no adenopathy, no JVD, supple, symmetrical, trachea midline, thyroid not enlarged, symmetric, no tenderness/mass/nodules, and soft right carotid bruit Lungs: clear to auscultation bilaterally Heart: regular rate and rhythm, S1, S2 normal, no murmur, click, rub or gallop Extremities: extremities normal, atraumatic, no cyanosis or edema Pulses: 2+ and symmetric Skin: Skin color, texture, turgor normal. No rashes or lesions Neurologic: Grossly normal  EKG sinus bradycardia 53 without ST or T wave changes.  Personally reviewed this EKG.  ASSESSMENT AND PLAN:   Chronic systolic CHF (congestive heart failure) (HCC) Moderate LV dysfunction with an EF by 2D echo performed 03/20/2020 of 40 to 45% with global hypokinesia and grade 1 diastolic dysfunction.  He is on carvedilol and losartan.  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  Hyperlipidemia History of hyperlipidemia on statin therapy lipid profile performed 09/03/2019 revealing total cholesterol 103, LDL 32 and HDL 58.     Lorretta Harp MD FACP,FACC,FAHA, [facility] 01/30/2022 2:50 PM

## 2022-01-31 ENCOUNTER — Inpatient Hospital Stay: Payer: Medicare PPO

## 2022-01-31 VITALS — BP 131/71 | HR 56 | Temp 97.9°F | Resp 18

## 2022-01-31 DIAGNOSIS — D472 Monoclonal gammopathy: Secondary | ICD-10-CM

## 2022-01-31 DIAGNOSIS — Z5111 Encounter for antineoplastic chemotherapy: Secondary | ICD-10-CM | POA: Diagnosis not present

## 2022-01-31 DIAGNOSIS — C9000 Multiple myeloma not having achieved remission: Secondary | ICD-10-CM

## 2022-01-31 LAB — CBC WITH DIFFERENTIAL (CANCER CENTER ONLY)
Abs Immature Granulocytes: 0.05 10*3/uL (ref 0.00–0.07)
Basophils Absolute: 0 10*3/uL (ref 0.0–0.1)
Basophils Relative: 0 %
Eosinophils Absolute: 0.2 10*3/uL (ref 0.0–0.5)
Eosinophils Relative: 3 %
HCT: 35.5 % — ABNORMAL LOW (ref 39.0–52.0)
Hemoglobin: 12.5 g/dL — ABNORMAL LOW (ref 13.0–17.0)
Immature Granulocytes: 1 %
Lymphocytes Relative: 18 %
Lymphs Abs: 1 10*3/uL (ref 0.7–4.0)
MCH: 34.8 pg — ABNORMAL HIGH (ref 26.0–34.0)
MCHC: 35.2 g/dL (ref 30.0–36.0)
MCV: 98.9 fL (ref 80.0–100.0)
Monocytes Absolute: 0.4 10*3/uL (ref 0.1–1.0)
Monocytes Relative: 7 %
Neutro Abs: 3.7 10*3/uL (ref 1.7–7.7)
Neutrophils Relative %: 71 %
Platelet Count: 83 10*3/uL — ABNORMAL LOW (ref 150–400)
RBC: 3.59 MIL/uL — ABNORMAL LOW (ref 4.22–5.81)
RDW: 12.2 % (ref 11.5–15.5)
WBC Count: 5.3 10*3/uL (ref 4.0–10.5)
nRBC: 0 % (ref 0.0–0.2)

## 2022-01-31 LAB — COMPREHENSIVE METABOLIC PANEL
ALT: 16 U/L (ref 0–44)
AST: 20 U/L (ref 15–41)
Albumin: 4.3 g/dL (ref 3.5–5.0)
Alkaline Phosphatase: 72 U/L (ref 38–126)
Anion gap: 6 (ref 5–15)
BUN: 25 mg/dL — ABNORMAL HIGH (ref 8–23)
CO2: 31 mmol/L (ref 22–32)
Calcium: 9.2 mg/dL (ref 8.9–10.3)
Chloride: 103 mmol/L (ref 98–111)
Creatinine, Ser: 1.21 mg/dL (ref 0.61–1.24)
GFR, Estimated: 60 mL/min — ABNORMAL LOW (ref >60)
Glucose, Bld: 108 mg/dL — ABNORMAL HIGH (ref 70–99)
Potassium: 4.3 mmol/L (ref 3.5–5.1)
Sodium: 140 mmol/L (ref 135–145)
Total Bilirubin: 1 mg/dL (ref 0.3–1.2)
Total Protein: 6.7 g/dL (ref 6.5–8.1)

## 2022-01-31 MED ORDER — DEXAMETHASONE 4 MG PO TABS
20.0000 mg | ORAL_TABLET | Freq: Once | ORAL | Status: AC
  Administered 2022-01-31: 20 mg via ORAL
  Filled 2022-01-31: qty 5

## 2022-01-31 MED ORDER — BORTEZOMIB CHEMO SQ INJECTION 3.5 MG (2.5MG/ML)
1.3000 mg/m2 | Freq: Once | INTRAMUSCULAR | Status: AC
  Administered 2022-01-31: 2.5 mg via SUBCUTANEOUS
  Filled 2022-01-31: qty 1

## 2022-01-31 NOTE — Progress Notes (Signed)
Ok to treat with today's platelets value per Dr. Marin Olp.

## 2022-01-31 NOTE — Patient Instructions (Signed)
[facility]  Discharge Instructions: Thank you for choosing [facility] to provide your oncology and hematology care.   If you have a lab appointment with the Cancer Center, please go directly to the Cancer Center and check in at the registration area.  Wear comfortable clothing and clothing appropriate for easy access to any Portacath or PICC line.   We strive to give you quality time with your provider. You may need to reschedule your appointment if you arrive late (15 or more minutes).  Arriving late affects you and other patients whose appointments are after yours.  Also, if you miss three or more appointments without notifying the office, you may be dismissed from the clinic at the provider's discretion.      For prescription refill requests, have your pharmacy contact our office and allow 72 hours for refills to be completed.    Today you received the following chemotherapy and/or immunotherapy agents Velcade       To help prevent nausea and vomiting after your treatment, we encourage you to take your nausea medication as directed.  BELOW ARE SYMPTOMS THAT SHOULD BE REPORTED IMMEDIATELY: *FEVER GREATER THAN 100.4 F (38 C) OR HIGHER *CHILLS OR SWEATING *NAUSEA AND VOMITING THAT IS NOT CONTROLLED WITH YOUR NAUSEA MEDICATION *UNUSUAL SHORTNESS OF BREATH *UNUSUAL BRUISING OR BLEEDING *URINARY PROBLEMS (pain or burning when urinating, or frequent urination) *BOWEL PROBLEMS (unusual diarrhea, constipation, pain near the anus) TENDERNESS IN MOUTH AND THROAT WITH OR WITHOUT PRESENCE OF ULCERS (sore throat, sores in mouth, or a toothache) UNUSUAL RASH, SWELLING OR PAIN  UNUSUAL VAGINAL DISCHARGE OR ITCHING   Items with * indicate a potential emergency and should be followed up as soon as possible or go to the Emergency Department if any problems should occur.  Please show the CHEMOTHERAPY ALERT CARD or IMMUNOTHERAPY ALERT CARD at check-in to the  Emergency Department and triage nurse. Should you have questions after your visit or need to cancel or reschedule your appointment, please contact [facility]  [phone] and follow the prompts.  Office hours are 8:00 a.m. to 4:30 p.m. Monday - Friday. Please note that voicemails left after 4:00 p.m. may not be returned until the following business day.  We are closed weekends and major holidays. You have access to a nurse at all times for urgent questions. Please call the main number to the clinic [phone] and follow the prompts.  For any non-urgent questions, you may also contact your provider using MyChart. We now offer e-Visits for anyone 18 and older to request care online for non-urgent symptoms. For details visit [email or web address].   Also download the MyChart app! Go to the app store, search "MyChart", open the app, select [facility], and log in with your MyChart username and password.  Masks are optional in the cancer centers. If you would like for your care team to wear a mask while they are taking care of you, please let them know. For doctor visits, patients may have with them one support person who is at least 82 years old. At this time, visitors are not allowed in the infusion area. 

## 2022-02-08 ENCOUNTER — Ambulatory Visit: Payer: Medicare PPO | Admitting: Cardiovascular Disease

## 2022-02-13 ENCOUNTER — Inpatient Hospital Stay: Payer: Medicare PPO

## 2022-02-13 ENCOUNTER — Inpatient Hospital Stay: Payer: Medicare PPO | Admitting: Hematology & Oncology

## 2022-02-13 ENCOUNTER — Inpatient Hospital Stay: Payer: Medicare PPO | Attending: Hematology & Oncology

## 2022-02-13 ENCOUNTER — Encounter: Payer: Self-pay | Admitting: Hematology & Oncology

## 2022-02-13 VITALS — BP 97/60 | HR 57 | Temp 97.6°F | Resp 22 | Wt 191.8 lb

## 2022-02-13 DIAGNOSIS — C9000 Multiple myeloma not having achieved remission: Secondary | ICD-10-CM | POA: Insufficient documentation

## 2022-02-13 DIAGNOSIS — Z5111 Encounter for antineoplastic chemotherapy: Secondary | ICD-10-CM | POA: Diagnosis present

## 2022-02-13 DIAGNOSIS — D472 Monoclonal gammopathy: Secondary | ICD-10-CM

## 2022-02-13 LAB — CMP (CANCER CENTER ONLY)
ALT: 23 U/L (ref 0–44)
AST: 24 U/L (ref 15–41)
Albumin: 4.3 g/dL (ref 3.5–5.0)
Alkaline Phosphatase: 76 U/L (ref 38–126)
Anion gap: 7 (ref 5–15)
BUN: 26 mg/dL — ABNORMAL HIGH (ref 8–23)
CO2: 31 mmol/L (ref 22–32)
Calcium: 9.2 mg/dL (ref 8.9–10.3)
Chloride: 103 mmol/L (ref 98–111)
Creatinine: 1.23 mg/dL (ref 0.61–1.24)
GFR, Estimated: 59 mL/min — ABNORMAL LOW (ref >60)
Glucose, Bld: 127 mg/dL — ABNORMAL HIGH (ref 70–99)
Potassium: 4.1 mmol/L (ref 3.5–5.1)
Sodium: 141 mmol/L (ref 135–145)
Total Bilirubin: 1 mg/dL (ref 0.3–1.2)
Total Protein: 6.6 g/dL (ref 6.5–8.1)

## 2022-02-13 LAB — CBC WITH DIFFERENTIAL (CANCER CENTER ONLY)
Abs Immature Granulocytes: 0.03 10*3/uL (ref 0.00–0.07)
Basophils Absolute: 0 10*3/uL (ref 0.0–0.1)
Basophils Relative: 0 %
Eosinophils Absolute: 0.1 10*3/uL (ref 0.0–0.5)
Eosinophils Relative: 3 %
HCT: 34.7 % — ABNORMAL LOW (ref 39.0–52.0)
Hemoglobin: 12.3 g/dL — ABNORMAL LOW (ref 13.0–17.0)
Immature Granulocytes: 1 %
Lymphocytes Relative: 21 %
Lymphs Abs: 0.8 10*3/uL (ref 0.7–4.0)
MCH: 35.1 pg — ABNORMAL HIGH (ref 26.0–34.0)
MCHC: 35.4 g/dL (ref 30.0–36.0)
MCV: 99.1 fL (ref 80.0–100.0)
Monocytes Absolute: 0.3 10*3/uL (ref 0.1–1.0)
Monocytes Relative: 7 %
Neutro Abs: 2.6 10*3/uL (ref 1.7–7.7)
Neutrophils Relative %: 68 %
Platelet Count: 82 10*3/uL — ABNORMAL LOW (ref 150–400)
RBC: 3.5 MIL/uL — ABNORMAL LOW (ref 4.22–5.81)
RDW: 12.5 % (ref 11.5–15.5)
WBC Count: 3.8 10*3/uL — ABNORMAL LOW (ref 4.0–10.5)
nRBC: 0 % (ref 0.0–0.2)

## 2022-02-13 LAB — LACTATE DEHYDROGENASE: LDH: 250 U/L — ABNORMAL HIGH (ref 98–192)

## 2022-02-13 MED ORDER — BORTEZOMIB CHEMO SQ INJECTION 3.5 MG (2.5MG/ML)
1.3000 mg/m2 | Freq: Once | INTRAMUSCULAR | Status: AC
  Administered 2022-02-13: 2.5 mg via SUBCUTANEOUS
  Filled 2022-02-13: qty 1

## 2022-02-13 MED ORDER — DEXAMETHASONE 4 MG PO TABS
20.0000 mg | ORAL_TABLET | Freq: Once | ORAL | Status: AC
  Administered 2022-02-13: 20 mg via ORAL
  Filled 2022-02-13: qty 5

## 2022-02-13 NOTE — Progress Notes (Signed)
Hematology and Oncology Follow Up Visit  Riley Eaton [id number] 1941/05/10 81 y.o. 02/13/2022   Principle Diagnosis:  IgG Kappa myeloma  -- +1q and t(11:14)  Current Therapy:   Velcade/Decadron -- ( 3 wk on/1 wk off) -- s/p cycle #5 -- start on 08/24/2021     Interim History:  Riley Eaton is back for follow-up.  He had a nice July 4 holiday.  He is with his family.  He did not do all that much because the hot weather.  His last myeloma studies did look better.  His monoclonal spike was down to 0.3 g/dL.  The Kappa light chain was 480 mg/L.  His last IgG level was 1107 mg/dL.  I am happy about that.  He is tolerating treatment quite well.  He has had no problems with cough or shortness of breath.  He has had no nausea or vomiting.  There has been no leg swelling.   His appetite has been pretty good.  There is been no bleeding.  He has had no issues with headache.  Overall, I would say his performance status is probably ECOG 1.    Medications:  Current Outpatient Medications:    acetaminophen (TYLENOL) 325 MG tablet, Take 2 tablets (650 mg total) by mouth every 6 (six) hours as needed for mild pain (or temp > 100)., Disp: 20 tablet, Rfl: 0   acyclovir (ZOVIRAX) 400 MG tablet, TAKE 1 TABLET BY MOUTH TWICE A DAY, Disp: 180 tablet, Rfl: 1   allopurinol (ZYLOPRIM) 100 MG tablet, TAKE 1 TABLET BY MOUTH EVERY DAY (Patient taking differently: Take 100 mg by mouth daily.), Disp: 90 tablet, Rfl: 1   aspirin 81 MG chewable tablet, Chew 1 tablet (81 mg total) by mouth daily., Disp: 30 tablet, Rfl: 0   atorvastatin (LIPITOR) 40 MG tablet, Take 1 tablet (40 mg total) by mouth daily at 6 PM. (Patient taking differently: Take 40 mg by mouth at bedtime.), Disp: 30 tablet, Rfl: 0   carvedilol (COREG) 6.25 MG tablet, Take 1 tablet by mouth 2 (two) times daily., Disp: , Rfl:    colchicine 0.6 MG tablet, , Disp: , Rfl:    diphenhydramine-acetaminophen (TYLENOL PM) 25-500 MG TABS tablet, Take 1 tablet  by mouth at bedtime., Disp: , Rfl:    ferrous sulfate 325 (65 FE) MG EC tablet, Take 325 mg by mouth daily., Disp: , Rfl:    finasteride (PROSCAR) 5 MG tablet, Take 1 tablet (5 mg total) by mouth daily. For urination, Disp: 90 tablet, Rfl: 3   furosemide (LASIX) 40 MG tablet, TAKE 1 TABLET BY MOUTH EVERY DAY, Disp: 90 tablet, Rfl: 3   HYDROcodone-acetaminophen (NORCO/VICODIN) 5-325 MG tablet, Take 1 tablet by mouth 4 (four) times daily., Disp: , Rfl:    lidocaine-prilocaine (EMLA) cream, Apply to affected area once, Disp: 30 g, Rfl: 3   LORazepam (ATIVAN) 0.5 MG tablet, Take 1 tablet (0.5 mg total) by mouth every 6 (six) hours as needed (Nausea or vomiting)., Disp: 30 tablet, Rfl: 0   losartan (COZAAR) 25 MG tablet, Take 25 mg by mouth daily., Disp: , Rfl:    MOVANTIK 25 MG TABS tablet, Take 25 mg by mouth daily., Disp: , Rfl:    OLANZapine (ZYPREXA) 5 MG tablet, Take 1 tablet (5 mg total) by mouth daily., Disp: 30 tablet, Rfl: 0   ondansetron (ZOFRAN ODT) 4 MG disintegrating tablet, Take 1 tablet (4 mg total) by mouth every 8 (eight) hours as needed for nausea or vomiting.,  Disp: 20 tablet, Rfl: 0   ondansetron (ZOFRAN) 8 MG tablet, Take 1 tablet (8 mg total) by mouth 2 (two) times daily as needed (Nausea or vomiting)., Disp: 30 tablet, Rfl: 1   pantoprazole (PROTONIX) 40 MG tablet, Take 1 tablet (40 mg total) by mouth at bedtime. (Patient taking differently: Take 40 mg by mouth daily.), Disp: 30 tablet, Rfl: 0   polyethylene glycol (MIRALAX / GLYCOLAX) 17 g packet, Take 17 g by mouth daily. (Patient taking differently: Take 17 g by mouth daily as needed (constipation).), Disp: 14 each, Rfl: 0   pregabalin (LYRICA) 150 MG capsule, Take 150 mg by mouth 2 (two) times daily., Disp: , Rfl:    prochlorperazine (COMPAZINE) 10 MG tablet, Take 1 tablet (10 mg total) by mouth every 6 (six) hours as needed (Nausea or vomiting)., Disp: 30 tablet, Rfl: 1   traZODone (DESYREL) 50 MG tablet, Take 50 mg by mouth  at bedtime., Disp: , Rfl:    vitamin B-12 (CYANOCOBALAMIN) 500 MCG tablet, Take 500 mcg by mouth daily., Disp: , Rfl:   Allergies: No Known Allergies  Past Medical History, Surgical history, Social history, and Family History were reviewed and updated.  Review of Systems: Review of Systems  Constitutional: Negative.   HENT:  Negative.    Eyes: Negative.   Respiratory: Negative.    Cardiovascular: Negative.   Gastrointestinal: Negative.   Endocrine: Negative.   Genitourinary: Negative.    Musculoskeletal: Negative.   Skin: Negative.   Neurological: Negative.   Hematological: Negative.   Psychiatric/Behavioral: Negative.      Physical Exam:  weight is 191 lb 12 oz (87 kg). His oral temperature is 97.6 F (36.4 C). His blood pressure is 97/60 and his pulse is 57 (abnormal). His respiration is 22 (abnormal) and oxygen saturation is 95%.   Wt Readings from Last 3 Encounters:  02/13/22 191 lb 12 oz (87 kg)  01/30/22 190 lb 12.8 oz (86.5 kg)  01/17/22 188 lb 1.9 oz (85.3 kg)    Physical Exam Vitals reviewed.  HENT:     Head: Normocephalic and atraumatic.  Eyes:     Pupils: Pupils are equal, round, and reactive to light.  Cardiovascular:     Rate and Rhythm: Normal rate and regular rhythm.     Heart sounds: Normal heart sounds.  Pulmonary:     Effort: Pulmonary effort is normal.     Breath sounds: Normal breath sounds.  Abdominal:     General: Bowel sounds are normal.     Palpations: Abdomen is soft.  Musculoskeletal:        General: No tenderness or deformity. Normal range of motion.     Cervical back: Normal range of motion.  Lymphadenopathy:     Cervical: No cervical adenopathy.  Skin:    General: Skin is warm and dry.     Findings: No erythema or rash.  Neurological:     Mental Status: He is alert and oriented to person, place, and time.  Psychiatric:        Behavior: Behavior normal.        Thought Content: Thought content normal.        Judgment: Judgment  normal.    Lab Results  Component Value Date   WBC 3.8 (L) 02/13/2022   HGB 12.3 (L) 02/13/2022   HCT 34.7 (L) 02/13/2022   MCV 99.1 02/13/2022   PLT 82 (L) 02/13/2022     Chemistry      Component Value Date/Time  NA 141 02/13/2022 0827   NA 142 10/15/2019 1500   K 4.1 02/13/2022 0827   CL 103 02/13/2022 0827   CO2 31 02/13/2022 0827   BUN 26 (H) 02/13/2022 0827   BUN 25 10/15/2019 1500   CREATININE 1.23 02/13/2022 0827      Component Value Date/Time   CALCIUM 9.2 02/13/2022 0827   ALKPHOS 76 02/13/2022 0827   AST 24 02/13/2022 0827   ALT 23 02/13/2022 0827   BILITOT 1.0 02/13/2022 0827      Impression and Plan: Mr. Haddix is a very nice 81 year old white male.  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  We will continue him on the Velcade protocol.  Again we will see what his numbers look like this time.  Hopefully, they will continue to improve.  His quality of life is really what is important.  He is able to do what he likes to do.  We will plan to get him back in another month.   [name], MD 7/5/20239:05 AM

## 2022-02-13 NOTE — Progress Notes (Signed)
Ok to treat with platelets of 82 per Dr Marin Olp. dph

## 2022-02-13 NOTE — Patient Instructions (Signed)
[facility]  Discharge Instructions: Thank you for choosing [facility] to provide your oncology and hematology care.   If you have a lab appointment with the [facility], please go directly to the [facility] and check in at the registration area.  Wear comfortable clothing and clothing appropriate for easy access to any Portacath or PICC line.   We strive to give you quality time with your provider. You may need to reschedule your appointment if you arrive late (15 or more minutes).  Arriving late affects you and other patients whose appointments are after yours.  Also, if you miss three or more appointments without notifying the office, you may be dismissed from the clinic at the provider's discretion.      For prescription refill requests, have your pharmacy contact our office and allow 72 hours for refills to be completed.    Today you received the following chemotherapy and/or immunotherapy agents Velcade      To help prevent nausea and vomiting after your treatment, we encourage you to take your nausea medication as directed.  BELOW ARE SYMPTOMS THAT SHOULD BE REPORTED IMMEDIATELY: *FEVER GREATER THAN 100.4 F (38 C) OR HIGHER *CHILLS OR SWEATING *NAUSEA AND VOMITING THAT IS NOT CONTROLLED WITH YOUR NAUSEA MEDICATION *UNUSUAL SHORTNESS OF BREATH *UNUSUAL BRUISING OR BLEEDING *URINARY PROBLEMS (pain or burning when urinating, or frequent urination) *BOWEL PROBLEMS (unusual diarrhea, constipation, pain near the anus) TENDERNESS IN MOUTH AND THROAT WITH OR WITHOUT PRESENCE OF ULCERS (sore throat, sores in mouth, or a toothache) UNUSUAL RASH, SWELLING OR PAIN  UNUSUAL VAGINAL DISCHARGE OR ITCHING   Items with * indicate a potential emergency and should be followed up as soon as possible or go to the Emergency Department if any problems should occur.  Please show the CHEMOTHERAPY ALERT CARD or IMMUNOTHERAPY ALERT CARD at check-in to the  Emergency Department and triage nurse. Should you have questions after your visit or need to cancel or reschedule your appointment, please contact [facility]  [phone] and follow the prompts.  Office hours are 8:00 a.m. to 4:30 p.m. Monday - Friday. Please note that voicemails left after 4:00 p.m. may not be returned until the following business day.  We are closed weekends and major holidays. You have access to a nurse at all times for urgent questions. Please call the main number to the clinic [phone] and follow the prompts.  For any non-urgent questions, you may also contact your provider using MyChart. We now offer e-Visits for anyone 82 and older to request care online for non-urgent symptoms. For details visit [email or web address].   Also download the MyChart app! Go to the app store, search "MyChart", open the app, select [facility], and log in with your MyChart username and password.  Masks are optional in the cancer centers. If you would like for your care team to wear a mask while they are taking care of you, please let them know. For doctor visits, patients may have with them one support person who is at least 82 years old. At this time, visitors are not allowed in the infusion area.

## 2022-02-14 ENCOUNTER — Telehealth: Payer: Self-pay

## 2022-02-14 LAB — IGG, IGA, IGM
IgA: 158 mg/dL (ref 61–437)
IgG (Immunoglobin G), Serum: 1007 mg/dL (ref 603–1613)
IgM (Immunoglobulin M), Srm: 49 mg/dL (ref 15–143)

## 2022-02-14 LAB — KAPPA/LAMBDA LIGHT CHAINS
Kappa free light chain: 478 mg/L — ABNORMAL HIGH (ref 3.3–19.4)
Kappa, lambda light chain ratio: 21.24 — ABNORMAL HIGH (ref 0.26–1.65)
Lambda free light chains: 22.5 mg/L (ref 5.7–26.3)

## 2022-02-14 NOTE — Telephone Encounter (Signed)
-----   Message from [name], MD sent at 02/14/2022  3:25 PM EDT ----- Call and let him know that the light chains are holding steady right now.  I still do not think we have to make any adjustment to his protocol.

## 2022-02-14 NOTE — Telephone Encounter (Signed)
Called and informed patient of lab results, patient verbalized understanding and denies any questions or concerns at this time.   

## 2022-02-18 LAB — IMMUNOFIXATION REFLEX, SERUM
IgA: 152 mg/dL (ref 61–437)
IgG (Immunoglobin G), Serum: 1006 mg/dL (ref 603–1613)
IgM (Immunoglobulin M), Srm: 46 mg/dL (ref 15–143)

## 2022-02-18 LAB — PROTEIN ELECTROPHORESIS, SERUM, WITH REFLEX
A/G Ratio: 1.7 (ref 0.7–1.7)
Albumin ELP: 3.9 g/dL (ref 2.9–4.4)
Alpha-1-Globulin: 0.2 g/dL (ref 0.0–0.4)
Alpha-2-Globulin: 0.4 g/dL (ref 0.4–1.0)
Beta Globulin: 0.7 g/dL (ref 0.7–1.3)
Gamma Globulin: 0.9 g/dL (ref 0.4–1.8)
Globulin, Total: 2.3 g/dL (ref 2.2–3.9)
M-Spike, %: 0.5 g/dL — ABNORMAL HIGH
SPEP Interpretation: 0
Total Protein ELP: 6.2 g/dL (ref 6.0–8.5)

## 2022-02-21 ENCOUNTER — Inpatient Hospital Stay: Payer: Medicare PPO

## 2022-02-21 VITALS — BP 99/54 | HR 61 | Temp 97.9°F | Resp 18

## 2022-02-21 DIAGNOSIS — D472 Monoclonal gammopathy: Secondary | ICD-10-CM

## 2022-02-21 DIAGNOSIS — Z5111 Encounter for antineoplastic chemotherapy: Secondary | ICD-10-CM | POA: Diagnosis not present

## 2022-02-21 LAB — CBC WITH DIFFERENTIAL/PLATELET
Abs Immature Granulocytes: 0.04 10*3/uL (ref 0.00–0.07)
Basophils Absolute: 0 10*3/uL (ref 0.0–0.1)
Basophils Relative: 1 %
Eosinophils Absolute: 0.2 10*3/uL (ref 0.0–0.5)
Eosinophils Relative: 5 %
HCT: 34.1 % — ABNORMAL LOW (ref 39.0–52.0)
Hemoglobin: 12 g/dL — ABNORMAL LOW (ref 13.0–17.0)
Immature Granulocytes: 1 %
Lymphocytes Relative: 23 %
Lymphs Abs: 1 10*3/uL (ref 0.7–4.0)
MCH: 34.7 pg — ABNORMAL HIGH (ref 26.0–34.0)
MCHC: 35.2 g/dL (ref 30.0–36.0)
MCV: 98.6 fL (ref 80.0–100.0)
Monocytes Absolute: 0.4 10*3/uL (ref 0.1–1.0)
Monocytes Relative: 8 %
Neutro Abs: 2.7 10*3/uL (ref 1.7–7.7)
Neutrophils Relative %: 62 %
Platelets: 73 10*3/uL — ABNORMAL LOW (ref 150–400)
RBC: 3.46 MIL/uL — ABNORMAL LOW (ref 4.22–5.81)
RDW: 12.7 % (ref 11.5–15.5)
WBC: 4.3 10*3/uL (ref 4.0–10.5)
nRBC: 0 % (ref 0.0–0.2)

## 2022-02-21 LAB — CMP (CANCER CENTER ONLY)
ALT: 15 U/L (ref 0–44)
AST: 21 U/L (ref 15–41)
Albumin: 4.3 g/dL (ref 3.5–5.0)
Alkaline Phosphatase: 63 U/L (ref 38–126)
Anion gap: 7 (ref 5–15)
BUN: 24 mg/dL — ABNORMAL HIGH (ref 8–23)
CO2: 29 mmol/L (ref 22–32)
Calcium: 9 mg/dL (ref 8.9–10.3)
Chloride: 106 mmol/L (ref 98–111)
Creatinine: 1.16 mg/dL (ref 0.61–1.24)
GFR, Estimated: >60 mL/min (ref >60)
Glucose, Bld: 141 mg/dL — ABNORMAL HIGH (ref 70–99)
Potassium: 3.8 mmol/L (ref 3.5–5.1)
Sodium: 142 mmol/L (ref 135–145)
Total Bilirubin: 0.9 mg/dL (ref 0.3–1.2)
Total Protein: 6.2 g/dL — ABNORMAL LOW (ref 6.5–8.1)

## 2022-02-21 MED ORDER — DEXAMETHASONE 4 MG PO TABS
20.0000 mg | ORAL_TABLET | Freq: Once | ORAL | Status: AC
  Administered 2022-02-21: 20 mg via ORAL
  Filled 2022-02-21: qty 5

## 2022-02-21 MED ORDER — BORTEZOMIB CHEMO SQ INJECTION 3.5 MG (2.5MG/ML)
1.3000 mg/m2 | Freq: Once | INTRAMUSCULAR | Status: AC
  Administered 2022-02-21: 2.5 mg via SUBCUTANEOUS
  Filled 2022-02-21: qty 1

## 2022-02-21 NOTE — Progress Notes (Signed)
Labs reviewed with NP, ok to treat with PLT 73.

## 2022-02-21 NOTE — Patient Instructions (Signed)
Bortezomib injection What is this medication? BORTEZOMIB (bor TEZ oh mib) targets proteins in cancer cells and stops the cancer cells from growing. It treats multiple myeloma and mantle cell lymphoma. This medicine may be used for other purposes; ask your health care provider or pharmacist if you have questions. COMMON BRAND NAME(S): Velcade What should I tell my care team before I take this medication? They need to know if you have any of these conditions: dehydration diabetes (high blood sugar) heart disease liver disease tingling of the fingers or toes or other nerve disorder an unusual or allergic reaction to bortezomib, mannitol, boron, other medicines, foods, dyes, or preservatives pregnant or trying to get pregnant breast-feeding How should I use this medication? This medicine is injected into a vein or under the skin. It is given by a health care provider in a hospital or clinic setting. Talk to your health care provider about the use of this medicine in children. Special care may be needed. Overdosage: If you think you have taken too much of this medicine contact a poison control center or emergency room at once. NOTE: This medicine is only for you. Do not share this medicine with others. What if I miss a dose? Keep appointments for follow-up doses. It is important not to miss your dose. Call your health care provider if you are unable to keep an appointment. What may interact with this medication? This medicine may interact with the following medications: ketoconazole rifampin This list may not describe all possible interactions. Give your health care provider a list of all the medicines, herbs, non-prescription drugs, or dietary supplements you use. Also tell them if you smoke, drink alcohol, or use illegal drugs. Some items may interact with your medicine. What should I watch for while using this medication? Your condition will be monitored carefully while you are receiving  this medicine. You may need blood work done while you are taking this medicine. You may get drowsy or dizzy. Do not drive, use machinery, or do anything that needs mental alertness until you know how this medicine affects you. Do not stand up or sit up quickly, especially if you are an older patient. This reduces the risk of dizzy or fainting spells This medicine may increase your risk of getting an infection. Call your health care provider for advice if you get a fever, chills, sore throat, or other symptoms of a cold or flu. Do not treat yourself. Try to avoid being around people who are sick. Check with your health care provider if you have severe diarrhea, nausea, and vomiting, or if you sweat a lot. The loss of too much body fluid may make it dangerous for you to take this medicine. Do not become pregnant while taking this medicine or for 7 months after stopping it. Women should inform their health care provider if they wish to become pregnant or think they might be pregnant. Men should not father a child while taking this medicine and for 4 months after stopping it. There is a potential for serious harm to an unborn child. Talk to your health care provider for more information. Do not breast-feed an infant while taking this medicine or for 2 months after stopping it. This medicine may make it more difficult to get pregnant or father a child. Talk to your health care provider if you are concerned about your fertility. What side effects may I notice from receiving this medication? Side effects that you should report to your doctor or health  care professional as soon as possible: allergic reactions (skin rash; itching or hives; swelling of the face, lips, or tongue) bleeding (bloody or black, tarry stools; red or dark brown urine; spitting up blood or brown material that looks like coffee grounds; red spots on the skin; unusual bruising or bleeding from the eye, gums, or nose) blurred vision or changes  in vision confusion constipation headache heart failure (trouble breathing; fast, irregular heartbeat; sudden weight gain; swelling of the ankles, feet, hands) infection (fever, chills, cough, sore throat, pain or trouble passing urine) lack or loss of appetite liver injury (dark yellow or brown urine; general ill feeling or flu-like symptoms; loss of appetite, right upper belly pain; yellowing of the eyes or skin) low blood pressure (dizziness; feeling faint or lightheaded, falls; unusually weak or tired) muscle cramps pain, redness, or irritation at site where injected pain, tingling, numbness in the hands or feet seizures trouble breathing unusual bruising or bleeding Side effects that usually do not require medical attention (report to your doctor or health care professional if they continue or are bothersome): diarrhea nausea stomach pain trouble sleeping vomiting This list may not describe all possible side effects. Call your doctor for medical advice about side effects. You may report side effects to FDA at 1-800-FDA-1088. Where should I keep my medication? This medicine is given in a hospital or clinic. It will not be stored at home. NOTE: This sheet is a summary. It may not cover all possible information. If you have questions about this medicine, talk to your doctor, pharmacist, or health care provider.  2023 Elsevier/Gold Standard (2020-07-20 00:00:00)

## 2022-02-28 ENCOUNTER — Inpatient Hospital Stay: Payer: Medicare PPO

## 2022-02-28 VITALS — BP 106/72 | HR 56 | Temp 97.7°F | Resp 17

## 2022-02-28 DIAGNOSIS — D472 Monoclonal gammopathy: Secondary | ICD-10-CM

## 2022-02-28 DIAGNOSIS — Z5111 Encounter for antineoplastic chemotherapy: Secondary | ICD-10-CM | POA: Diagnosis not present

## 2022-02-28 LAB — COMPREHENSIVE METABOLIC PANEL
ALT: 14 U/L (ref 0–44)
AST: 19 U/L (ref 15–41)
Albumin: 4.2 g/dL (ref 3.5–5.0)
Alkaline Phosphatase: 68 U/L (ref 38–126)
Anion gap: 5 (ref 5–15)
BUN: 23 mg/dL (ref 8–23)
CO2: 31 mmol/L (ref 22–32)
Calcium: 9.1 mg/dL (ref 8.9–10.3)
Chloride: 104 mmol/L (ref 98–111)
Creatinine, Ser: 1.13 mg/dL (ref 0.61–1.24)
GFR, Estimated: >60 mL/min (ref >60)
Glucose, Bld: 118 mg/dL — ABNORMAL HIGH (ref 70–99)
Potassium: 4 mmol/L (ref 3.5–5.1)
Sodium: 140 mmol/L (ref 135–145)
Total Bilirubin: 1 mg/dL (ref 0.3–1.2)
Total Protein: 6.2 g/dL — ABNORMAL LOW (ref 6.5–8.1)

## 2022-02-28 LAB — CBC WITH DIFFERENTIAL/PLATELET
Abs Immature Granulocytes: 0.06 10*3/uL (ref 0.00–0.07)
Basophils Absolute: 0 10*3/uL (ref 0.0–0.1)
Basophils Relative: 0 %
Eosinophils Absolute: 0.2 10*3/uL (ref 0.0–0.5)
Eosinophils Relative: 4 %
HCT: 34.7 % — ABNORMAL LOW (ref 39.0–52.0)
Hemoglobin: 12.2 g/dL — ABNORMAL LOW (ref 13.0–17.0)
Immature Granulocytes: 1 %
Lymphocytes Relative: 17 %
Lymphs Abs: 0.9 10*3/uL (ref 0.7–4.0)
MCH: 34.9 pg — ABNORMAL HIGH (ref 26.0–34.0)
MCHC: 35.2 g/dL (ref 30.0–36.0)
MCV: 99.1 fL (ref 80.0–100.0)
Monocytes Absolute: 0.4 10*3/uL (ref 0.1–1.0)
Monocytes Relative: 8 %
Neutro Abs: 3.6 10*3/uL (ref 1.7–7.7)
Neutrophils Relative %: 70 %
Platelets: 75 10*3/uL — ABNORMAL LOW (ref 150–400)
RBC: 3.5 MIL/uL — ABNORMAL LOW (ref 4.22–5.81)
RDW: 12.7 % (ref 11.5–15.5)
WBC: 5.2 10*3/uL (ref 4.0–10.5)
nRBC: 0 % (ref 0.0–0.2)

## 2022-02-28 MED ORDER — DEXAMETHASONE 4 MG PO TABS
20.0000 mg | ORAL_TABLET | Freq: Once | ORAL | Status: AC
  Administered 2022-02-28: 20 mg via ORAL
  Filled 2022-02-28: qty 5

## 2022-02-28 MED ORDER — BORTEZOMIB CHEMO SQ INJECTION 3.5 MG (2.5MG/ML)
1.3000 mg/m2 | Freq: Once | INTRAMUSCULAR | Status: AC
  Administered 2022-02-28: 2.5 mg via SUBCUTANEOUS
  Filled 2022-02-28: qty 1

## 2022-02-28 NOTE — Progress Notes (Signed)
Ok to treat with platelets of 75 per Dr Marin Olp. dph

## 2022-03-04 ENCOUNTER — Other Ambulatory Visit: Payer: Self-pay

## 2022-03-12 ENCOUNTER — Other Ambulatory Visit: Payer: Self-pay

## 2022-03-13 ENCOUNTER — Other Ambulatory Visit: Payer: Self-pay

## 2022-03-13 ENCOUNTER — Inpatient Hospital Stay: Payer: Medicare PPO | Admitting: Hematology & Oncology

## 2022-03-13 ENCOUNTER — Encounter: Payer: Self-pay | Admitting: Hematology & Oncology

## 2022-03-13 ENCOUNTER — Inpatient Hospital Stay: Payer: Medicare PPO | Attending: Hematology & Oncology

## 2022-03-13 ENCOUNTER — Inpatient Hospital Stay: Payer: Medicare PPO

## 2022-03-13 VITALS — BP 119/69 | HR 57 | Temp 97.6°F | Resp 20 | Wt 192.0 lb

## 2022-03-13 DIAGNOSIS — D472 Monoclonal gammopathy: Secondary | ICD-10-CM | POA: Diagnosis not present

## 2022-03-13 DIAGNOSIS — C9000 Multiple myeloma not having achieved remission: Secondary | ICD-10-CM | POA: Insufficient documentation

## 2022-03-13 DIAGNOSIS — Z5111 Encounter for antineoplastic chemotherapy: Secondary | ICD-10-CM | POA: Diagnosis not present

## 2022-03-13 LAB — CBC WITH DIFFERENTIAL (CANCER CENTER ONLY)
Abs Immature Granulocytes: 0.03 10*3/uL (ref 0.00–0.07)
Basophils Absolute: 0 10*3/uL (ref 0.0–0.1)
Basophils Relative: 1 %
Eosinophils Absolute: 0.2 10*3/uL (ref 0.0–0.5)
Eosinophils Relative: 6 %
HCT: 35.9 % — ABNORMAL LOW (ref 39.0–52.0)
Hemoglobin: 12.7 g/dL — ABNORMAL LOW (ref 13.0–17.0)
Immature Granulocytes: 1 %
Lymphocytes Relative: 17 %
Lymphs Abs: 0.7 10*3/uL (ref 0.7–4.0)
MCH: 34.9 pg — ABNORMAL HIGH (ref 26.0–34.0)
MCHC: 35.4 g/dL (ref 30.0–36.0)
MCV: 98.6 fL (ref 80.0–100.0)
Monocytes Absolute: 0.3 10*3/uL (ref 0.1–1.0)
Monocytes Relative: 7 %
Neutro Abs: 2.9 10*3/uL (ref 1.7–7.7)
Neutrophils Relative %: 68 %
Platelet Count: 90 10*3/uL — ABNORMAL LOW (ref 150–400)
RBC: 3.64 MIL/uL — ABNORMAL LOW (ref 4.22–5.81)
RDW: 12.3 % (ref 11.5–15.5)
WBC Count: 4.2 10*3/uL (ref 4.0–10.5)
nRBC: 0 % (ref 0.0–0.2)

## 2022-03-13 LAB — CMP (CANCER CENTER ONLY)
ALT: 17 U/L (ref 0–44)
AST: 22 U/L (ref 15–41)
Albumin: 4.5 g/dL (ref 3.5–5.0)
Alkaline Phosphatase: 74 U/L (ref 38–126)
Anion gap: 7 (ref 5–15)
BUN: 25 mg/dL — ABNORMAL HIGH (ref 8–23)
CO2: 31 mmol/L (ref 22–32)
Calcium: 9.7 mg/dL (ref 8.9–10.3)
Chloride: 102 mmol/L (ref 98–111)
Creatinine: 1.2 mg/dL (ref 0.61–1.24)
GFR, Estimated: >60 mL/min (ref >60)
Glucose, Bld: 138 mg/dL — ABNORMAL HIGH (ref 70–99)
Potassium: 4.2 mmol/L (ref 3.5–5.1)
Sodium: 140 mmol/L (ref 135–145)
Total Bilirubin: 1 mg/dL (ref 0.3–1.2)
Total Protein: 6.7 g/dL (ref 6.5–8.1)

## 2022-03-13 LAB — LACTATE DEHYDROGENASE: LDH: 305 U/L — ABNORMAL HIGH (ref 98–192)

## 2022-03-13 MED ORDER — DEXAMETHASONE 4 MG PO TABS
20.0000 mg | ORAL_TABLET | Freq: Once | ORAL | Status: AC
  Administered 2022-03-13: 20 mg via ORAL
  Filled 2022-03-13: qty 5

## 2022-03-13 MED ORDER — BORTEZOMIB CHEMO SQ INJECTION 3.5 MG (2.5MG/ML)
1.3000 mg/m2 | Freq: Once | INTRAMUSCULAR | Status: AC
  Administered 2022-03-13: 2.5 mg via SUBCUTANEOUS
  Filled 2022-03-13: qty 1

## 2022-03-13 NOTE — Progress Notes (Signed)
OK to treat with platelet count-90 per order of Dr. Marin Olp.

## 2022-03-13 NOTE — Progress Notes (Signed)
Hematology and Oncology Follow Up Visit  Riley Eaton [id number] 1940-09-24 81 y.o. 03/13/2022   Principle Diagnosis:  IgG Kappa myeloma  -- +1q and t(11:14)  Current Therapy:   Velcade/Decadron -- ( 3 wk on/1 wk off) -- s/p cycle #6 -- start on 08/24/2021     Interim History:  Mr. Riley Eaton is back for follow-up.  So far, he is tolerated treatment quite nicely.  However, I am just not getting the response that I would like so far.  His light chains are coming down to the coming down incredibly slowly.  Last saw him, his Kappa light chain was 48 mg/dL.  His monoclonal spike is also trending down very slowly.  Over last saw him, the M spike was 0.5 g/dL.  His IgG level was 1000 mg/dL.  I think we may have to think about adding Revlimid to the protocol.  I just do not want to make this too complicated for him.  However, I want to make sure that we continue to work on his myeloma.  He does have some adverse prognostic factors so it may be the reason why he is not responding as briskly as I would like.  We will see what his numbers look like this time.  Again, I will have a very low threshold for adding Revlimid.  He has had no cough or shortness of breath.  He has had no nausea or vomiting.  There is no change in bowel or bladder habits.  He has had no bleeding.  He has had no bony pain.  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  His appetite has been quite good.  Overall, I would say his performance status is probably ECOG 1.      Medications:  Current Outpatient Medications:    acetaminophen (TYLENOL) 325 MG tablet, Take 2 tablets (650 mg total) by mouth every 6 (six) hours as needed for mild pain (or temp > 100)., Disp: 20 tablet, Rfl: 0   acyclovir (ZOVIRAX) 400 MG tablet, TAKE 1 TABLET BY MOUTH TWICE A DAY, Disp: 180 tablet, Rfl: 1   allopurinol (ZYLOPRIM) 100 MG tablet, TAKE 1 TABLET BY MOUTH EVERY DAY (Patient taking differently: Take 100 mg by mouth daily.), Disp:  90 tablet, Rfl: 1   aspirin 81 MG chewable tablet, Chew 1 tablet (81 mg total) by mouth daily., Disp: 30 tablet, Rfl: 0   atorvastatin (LIPITOR) 40 MG tablet, Take 1 tablet (40 mg total) by mouth daily at 6 PM. (Patient taking differently: Take 40 mg by mouth at bedtime.), Disp: 30 tablet, Rfl: 0   carvedilol (COREG) 6.25 MG tablet, Take 1 tablet by mouth 2 (two) times daily., Disp: , Rfl:    colchicine 0.6 MG tablet, , Disp: , Rfl:    diphenhydramine-acetaminophen (TYLENOL PM) 25-500 MG TABS tablet, Take 1 tablet by mouth at bedtime., Disp: , Rfl:    ferrous sulfate 325 (65 FE) MG EC tablet, Take 325 mg by mouth daily., Disp: , Rfl:    finasteride (PROSCAR) 5 MG tablet, Take 1 tablet (5 mg total) by mouth daily. For urination, Disp: 90 tablet, Rfl: 3   furosemide (LASIX) 40 MG tablet, TAKE 1 TABLET BY MOUTH EVERY DAY, Disp: 90 tablet, Rfl: 3   HYDROcodone-acetaminophen (NORCO/VICODIN) 5-325 MG tablet, Take 1 tablet by mouth 4 (four) times daily., Disp: , Rfl:    lidocaine-prilocaine (EMLA) cream, Apply to affected area once, Disp: 30 g, Rfl: 3   LORazepam (ATIVAN) 0.5 MG  tablet, Take 1 tablet (0.5 mg total) by mouth every 6 (six) hours as needed (Nausea or vomiting)., Disp: 30 tablet, Rfl: 0   losartan (COZAAR) 25 MG tablet, Take 25 mg by mouth daily., Disp: , Rfl:    MOVANTIK 25 MG TABS tablet, Take 25 mg by mouth daily., Disp: , Rfl:    OLANZapine (ZYPREXA) 5 MG tablet, Take 1 tablet (5 mg total) by mouth daily., Disp: 30 tablet, Rfl: 0   ondansetron (ZOFRAN ODT) 4 MG disintegrating tablet, Take 1 tablet (4 mg total) by mouth every 8 (eight) hours as needed for nausea or vomiting., Disp: 20 tablet, Rfl: 0   ondansetron (ZOFRAN) 8 MG tablet, Take 1 tablet (8 mg total) by mouth 2 (two) times daily as needed (Nausea or vomiting)., Disp: 30 tablet, Rfl: 1   pantoprazole (PROTONIX) 40 MG tablet, Take 1 tablet (40 mg total) by mouth at bedtime. (Patient taking differently: Take 40 mg by mouth daily.),  Disp: 30 tablet, Rfl: 0   polyethylene glycol (MIRALAX / GLYCOLAX) 17 g packet, Take 17 g by mouth daily. (Patient taking differently: Take 17 g by mouth daily as needed (constipation).), Disp: 14 each, Rfl: 0   pregabalin (LYRICA) 150 MG capsule, Take 150 mg by mouth 2 (two) times daily., Disp: , Rfl:    prochlorperazine (COMPAZINE) 10 MG tablet, Take 1 tablet (10 mg total) by mouth every 6 (six) hours as needed (Nausea or vomiting)., Disp: 30 tablet, Rfl: 1   traZODone (DESYREL) 50 MG tablet, Take 50 mg by mouth at bedtime., Disp: , Rfl:    vitamin B-12 (CYANOCOBALAMIN) 500 MCG tablet, Take 500 mcg by mouth daily., Disp: , Rfl:   Allergies: No Known Allergies  Past Medical History, Surgical history, Social history, and Family History were reviewed and updated.  Review of Systems: Review of Systems  Constitutional: Negative.   HENT:  Negative.    Eyes: Negative.   Respiratory: Negative.    Cardiovascular: Negative.   Gastrointestinal: Negative.   Endocrine: Negative.   Genitourinary: Negative.    Musculoskeletal: Negative.   Skin: Negative.   Neurological: Negative.   Hematological: Negative.   Psychiatric/Behavioral: Negative.      Physical Exam:  weight is 192 lb (87.1 kg). His oral temperature is 97.6 F (36.4 C). His blood pressure is 119/69 and his pulse is 57 (abnormal). His respiration is 20 and oxygen saturation is 95%.   Wt Readings from Last 3 Encounters:  03/13/22 192 lb (87.1 kg)  02/13/22 191 lb 12 oz (87 kg)  01/30/22 190 lb 12.8 oz (86.5 kg)    Physical Exam Vitals reviewed.  HENT:     Head: Normocephalic and atraumatic.  Eyes:     Pupils: Pupils are equal, round, and reactive to light.  Cardiovascular:     Rate and Rhythm: Normal rate and regular rhythm.     Heart sounds: Normal heart sounds.  Pulmonary:     Effort: Pulmonary effort is normal.     Breath sounds: Normal breath sounds.  Abdominal:     General: Bowel sounds are normal.     Palpations:  Abdomen is soft.  Musculoskeletal:        General: No tenderness or deformity. Normal range of motion.     Cervical back: Normal range of motion.  Lymphadenopathy:     Cervical: No cervical adenopathy.  Skin:    General: Skin is warm and dry.     Findings: No erythema or rash.  Neurological:  Mental Status: He is alert and oriented to person, place, and time.  Psychiatric:        Behavior: Behavior normal.        Thought Content: Thought content normal.        Judgment: Judgment normal.    Lab Results  Component Value Date   WBC 4.2 03/13/2022   HGB 12.7 (L) 03/13/2022   HCT 35.9 (L) 03/13/2022   MCV 98.6 03/13/2022   PLT 90 (L) 03/13/2022     Chemistry      Component Value Date/Time   NA 140 03/13/2022 0917   NA 142 10/15/2019 1500   K 4.2 03/13/2022 0917   CL 102 03/13/2022 0917   CO2 31 03/13/2022 0917   BUN 25 (H) 03/13/2022 0917   BUN 25 10/15/2019 1500   CREATININE 1.20 03/13/2022 0917      Component Value Date/Time   CALCIUM 9.7 03/13/2022 0917   ALKPHOS 74 03/13/2022 0917   AST 22 03/13/2022 0917   ALT 17 03/13/2022 0917   BILITOT 1.0 03/13/2022 0917      Impression and Plan: Mr. Ladnier is a very nice 81 year old white male.  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  We will go ahead with his seventh cycle of treatment.  Again, if we do not see a significant improvement in the light chains, we may have to think about adding Revlimid.  I think this would be the easiest way to help him.  I think would be effective.  I do not see any reason why he could not be on Revlimid.  I know that he is doing all that we have asked him to do.  He is such a good man.  Again he served our country.  We need to make sure that we continue to serve him in the best manner possible.  We will plan to get him back to see [place] in another month.   [name], MD 8/2/202310:16 AM

## 2022-03-14 ENCOUNTER — Other Ambulatory Visit: Payer: Self-pay

## 2022-03-14 LAB — KAPPA/LAMBDA LIGHT CHAINS
Kappa free light chain: 429.6 mg/L — ABNORMAL HIGH (ref 3.3–19.4)
Kappa, lambda light chain ratio: 19.18 — ABNORMAL HIGH (ref 0.26–1.65)
Lambda free light chains: 22.4 mg/L (ref 5.7–26.3)

## 2022-03-15 ENCOUNTER — Other Ambulatory Visit: Payer: Self-pay

## 2022-03-15 LAB — IGG, IGA, IGM
IgA: 159 mg/dL (ref 61–437)
IgG (Immunoglobin G), Serum: 1017 mg/dL (ref 603–1613)
IgM (Immunoglobulin M), Srm: 54 mg/dL (ref 15–143)

## 2022-03-18 LAB — IMMUNOFIXATION REFLEX, SERUM
IgA: 181 mg/dL (ref 61–437)
IgG (Immunoglobin G), Serum: 1118 mg/dL (ref 603–1613)
IgM (Immunoglobulin M), Srm: 63 mg/dL (ref 15–143)

## 2022-03-18 LAB — PROTEIN ELECTROPHORESIS, SERUM, WITH REFLEX
A/G Ratio: 1.3 (ref 0.7–1.7)
Albumin ELP: 3.7 g/dL (ref 2.9–4.4)
Alpha-1-Globulin: 0.3 g/dL (ref 0.0–0.4)
Alpha-2-Globulin: 0.5 g/dL (ref 0.4–1.0)
Beta Globulin: 0.9 g/dL (ref 0.7–1.3)
Gamma Globulin: 1.1 g/dL (ref 0.4–1.8)
Globulin, Total: 2.8 g/dL (ref 2.2–3.9)
M-Spike, %: 0.6 g/dL — ABNORMAL HIGH
SPEP Interpretation: 0
Total Protein ELP: 6.5 g/dL (ref 6.0–8.5)

## 2022-03-20 ENCOUNTER — Inpatient Hospital Stay: Payer: Medicare PPO

## 2022-03-20 VITALS — BP 100/60 | HR 60 | Temp 97.7°F | Resp 20

## 2022-03-20 DIAGNOSIS — D472 Monoclonal gammopathy: Secondary | ICD-10-CM

## 2022-03-20 DIAGNOSIS — Z5111 Encounter for antineoplastic chemotherapy: Secondary | ICD-10-CM | POA: Diagnosis not present

## 2022-03-20 LAB — CBC WITH DIFFERENTIAL/PLATELET
Abs Immature Granulocytes: 0.05 10*3/uL (ref 0.00–0.07)
Basophils Absolute: 0 10*3/uL (ref 0.0–0.1)
Basophils Relative: 1 %
Eosinophils Absolute: 0.3 10*3/uL (ref 0.0–0.5)
Eosinophils Relative: 7 %
HCT: 35.4 % — ABNORMAL LOW (ref 39.0–52.0)
Hemoglobin: 12.4 g/dL — ABNORMAL LOW (ref 13.0–17.0)
Immature Granulocytes: 1 %
Lymphocytes Relative: 18 %
Lymphs Abs: 0.8 10*3/uL (ref 0.7–4.0)
MCH: 34.6 pg — ABNORMAL HIGH (ref 26.0–34.0)
MCHC: 35 g/dL (ref 30.0–36.0)
MCV: 98.9 fL (ref 80.0–100.0)
Monocytes Absolute: 0.3 10*3/uL (ref 0.1–1.0)
Monocytes Relative: 8 %
Neutro Abs: 2.9 10*3/uL (ref 1.7–7.7)
Neutrophils Relative %: 65 %
Platelets: 79 10*3/uL — ABNORMAL LOW (ref 150–400)
RBC: 3.58 MIL/uL — ABNORMAL LOW (ref 4.22–5.81)
RDW: 12.5 % (ref 11.5–15.5)
WBC: 4.4 10*3/uL (ref 4.0–10.5)
nRBC: 0 % (ref 0.0–0.2)

## 2022-03-20 LAB — COMPREHENSIVE METABOLIC PANEL
ALT: 15 U/L (ref 0–44)
AST: 21 U/L (ref 15–41)
Albumin: 4.2 g/dL (ref 3.5–5.0)
Alkaline Phosphatase: 64 U/L (ref 38–126)
Anion gap: 6 (ref 5–15)
BUN: 27 mg/dL — ABNORMAL HIGH (ref 8–23)
CO2: 30 mmol/L (ref 22–32)
Calcium: 9.1 mg/dL (ref 8.9–10.3)
Chloride: 103 mmol/L (ref 98–111)
Creatinine, Ser: 1.12 mg/dL (ref 0.61–1.24)
GFR, Estimated: >60 mL/min (ref >60)
Glucose, Bld: 118 mg/dL — ABNORMAL HIGH (ref 70–99)
Potassium: 4.2 mmol/L (ref 3.5–5.1)
Sodium: 139 mmol/L (ref 135–145)
Total Bilirubin: 0.7 mg/dL (ref 0.3–1.2)
Total Protein: 6.1 g/dL — ABNORMAL LOW (ref 6.5–8.1)

## 2022-03-20 MED ORDER — BORTEZOMIB CHEMO SQ INJECTION 3.5 MG (2.5MG/ML)
1.3000 mg/m2 | Freq: Once | INTRAMUSCULAR | Status: AC
  Administered 2022-03-20: 2.5 mg via SUBCUTANEOUS
  Filled 2022-03-20: qty 1

## 2022-03-20 MED ORDER — DEXAMETHASONE 4 MG PO TABS
20.0000 mg | ORAL_TABLET | Freq: Once | ORAL | Status: AC
  Administered 2022-03-20: 20 mg via ORAL

## 2022-03-20 NOTE — Progress Notes (Signed)
Reviewed labs with Dr Marin Olp.  Ok to treat with Platelets of 79,000

## 2022-03-20 NOTE — Patient Instructions (Signed)
[facility]  Discharge Instructions: Thank you for choosing [facility] to provide your oncology and hematology care.   If you have a lab appointment with the Cancer Center, please go directly to the Cancer Center and check in at the registration area.  Wear comfortable clothing and clothing appropriate for easy access to any Portacath or PICC line.   We strive to give you quality time with your provider. You may need to reschedule your appointment if you arrive late (15 or more minutes).  Arriving late affects you and other patients whose appointments are after yours.  Also, if you miss three or more appointments without notifying the office, you may be dismissed from the clinic at the provider's discretion.      For prescription refill requests, have your pharmacy contact our office and allow 72 hours for refills to be completed.    Today you received the following chemotherapy and/or immunotherapy agents Velcade      To help prevent nausea and vomiting after your treatment, we encourage you to take your nausea medication as directed.  BELOW ARE SYMPTOMS THAT SHOULD BE REPORTED IMMEDIATELY: *FEVER GREATER THAN 100.4 F (38 C) OR HIGHER *CHILLS OR SWEATING *NAUSEA AND VOMITING THAT IS NOT CONTROLLED WITH YOUR NAUSEA MEDICATION *UNUSUAL SHORTNESS OF BREATH *UNUSUAL BRUISING OR BLEEDING *URINARY PROBLEMS (pain or burning when urinating, or frequent urination) *BOWEL PROBLEMS (unusual diarrhea, constipation, pain near the anus) TENDERNESS IN MOUTH AND THROAT WITH OR WITHOUT PRESENCE OF ULCERS (sore throat, sores in mouth, or a toothache) UNUSUAL RASH, SWELLING OR PAIN  UNUSUAL VAGINAL DISCHARGE OR ITCHING   Items with * indicate a potential emergency and should be followed up as soon as possible or go to the Emergency Department if any problems should occur.  Please show the CHEMOTHERAPY ALERT CARD or IMMUNOTHERAPY ALERT CARD at check-in to the  Emergency Department and triage nurse. Should you have questions after your visit or need to cancel or reschedule your appointment, please contact [facility]  [phone] and follow the prompts.  Office hours are 8:00 a.m. to 4:30 p.m. Monday - Friday. Please note that voicemails left after 4:00 p.m. may not be returned until the following business day.  We are closed weekends and major holidays. You have access to a nurse at all times for urgent questions. Please call the main number to the clinic [phone] and follow the prompts.  For any non-urgent questions, you may also contact your provider using MyChart. We now offer e-Visits for anyone 18 and older to request care online for non-urgent symptoms. For details visit [email or web address].   Also download the MyChart app! Go to the app store, search "MyChart", open the app, select [facility], and log in with your MyChart username and password.  Masks are optional in the cancer centers. If you would like for your care team to wear a mask while they are taking care of you, please let them know. You may have one support person who is at least 82 years old accompany you for your appointments. 

## 2022-03-27 ENCOUNTER — Inpatient Hospital Stay: Payer: Medicare PPO

## 2022-03-27 VITALS — BP 110/74 | HR 53 | Temp 97.7°F | Resp 17

## 2022-03-27 DIAGNOSIS — D472 Monoclonal gammopathy: Secondary | ICD-10-CM

## 2022-03-27 DIAGNOSIS — Z5111 Encounter for antineoplastic chemotherapy: Secondary | ICD-10-CM | POA: Diagnosis not present

## 2022-03-27 LAB — COMPREHENSIVE METABOLIC PANEL
ALT: 16 U/L (ref 0–44)
AST: 19 U/L (ref 15–41)
Albumin: 4.3 g/dL (ref 3.5–5.0)
Alkaline Phosphatase: 66 U/L (ref 38–126)
Anion gap: 7 (ref 5–15)
BUN: 27 mg/dL — ABNORMAL HIGH (ref 8–23)
CO2: 30 mmol/L (ref 22–32)
Calcium: 9.1 mg/dL (ref 8.9–10.3)
Chloride: 104 mmol/L (ref 98–111)
Creatinine, Ser: 1.21 mg/dL (ref 0.61–1.24)
GFR, Estimated: 60 mL/min — ABNORMAL LOW (ref >60)
Glucose, Bld: 171 mg/dL — ABNORMAL HIGH (ref 70–99)
Potassium: 4.5 mmol/L (ref 3.5–5.1)
Sodium: 141 mmol/L (ref 135–145)
Total Bilirubin: 0.9 mg/dL (ref 0.3–1.2)
Total Protein: 6.7 g/dL (ref 6.5–8.1)

## 2022-03-27 LAB — CBC WITH DIFFERENTIAL/PLATELET
Abs Immature Granulocytes: 0.06 10*3/uL (ref 0.00–0.07)
Basophils Absolute: 0 10*3/uL (ref 0.0–0.1)
Basophils Relative: 0 %
Eosinophils Absolute: 0.3 10*3/uL (ref 0.0–0.5)
Eosinophils Relative: 5 %
HCT: 35.2 % — ABNORMAL LOW (ref 39.0–52.0)
Hemoglobin: 12.3 g/dL — ABNORMAL LOW (ref 13.0–17.0)
Immature Granulocytes: 1 %
Lymphocytes Relative: 18 %
Lymphs Abs: 1 10*3/uL (ref 0.7–4.0)
MCH: 34.6 pg — ABNORMAL HIGH (ref 26.0–34.0)
MCHC: 34.9 g/dL (ref 30.0–36.0)
MCV: 98.9 fL (ref 80.0–100.0)
Monocytes Absolute: 0.3 10*3/uL (ref 0.1–1.0)
Monocytes Relative: 7 %
Neutro Abs: 3.6 10*3/uL (ref 1.7–7.7)
Neutrophils Relative %: 69 %
Platelets: 86 10*3/uL — ABNORMAL LOW (ref 150–400)
RBC: 3.56 MIL/uL — ABNORMAL LOW (ref 4.22–5.81)
RDW: 12.5 % (ref 11.5–15.5)
WBC: 5.2 10*3/uL (ref 4.0–10.5)
nRBC: 0 % (ref 0.0–0.2)

## 2022-03-27 MED ORDER — BORTEZOMIB CHEMO SQ INJECTION 3.5 MG (2.5MG/ML)
1.3000 mg/m2 | Freq: Once | INTRAMUSCULAR | Status: AC
  Administered 2022-03-27: 2.5 mg via SUBCUTANEOUS
  Filled 2022-03-27: qty 1

## 2022-03-27 MED ORDER — DEXAMETHASONE 4 MG PO TABS
20.0000 mg | ORAL_TABLET | Freq: Once | ORAL | Status: AC
  Administered 2022-03-27: 20 mg via ORAL

## 2022-03-27 NOTE — Progress Notes (Signed)
Per Dr. Ennever, okay to treat today despite labs ?

## 2022-04-01 ENCOUNTER — Other Ambulatory Visit: Payer: Self-pay | Admitting: Hematology & Oncology

## 2022-04-01 DIAGNOSIS — D472 Monoclonal gammopathy: Secondary | ICD-10-CM

## 2022-04-10 ENCOUNTER — Inpatient Hospital Stay: Payer: Medicare PPO

## 2022-04-10 ENCOUNTER — Inpatient Hospital Stay: Payer: Medicare PPO | Admitting: Hematology & Oncology

## 2022-04-10 ENCOUNTER — Encounter: Payer: Self-pay | Admitting: Hematology & Oncology

## 2022-04-10 VITALS — BP 111/72 | HR 56 | Resp 18

## 2022-04-10 VITALS — BP 107/62 | HR 58 | Temp 97.5°F | Resp 18 | Wt 192.1 lb

## 2022-04-10 DIAGNOSIS — D472 Monoclonal gammopathy: Secondary | ICD-10-CM

## 2022-04-10 DIAGNOSIS — Z5111 Encounter for antineoplastic chemotherapy: Secondary | ICD-10-CM | POA: Diagnosis not present

## 2022-04-10 DIAGNOSIS — C9000 Multiple myeloma not having achieved remission: Secondary | ICD-10-CM

## 2022-04-10 LAB — CMP (CANCER CENTER ONLY)
ALT: 14 U/L (ref 0–44)
AST: 20 U/L (ref 15–41)
Albumin: 4.3 g/dL (ref 3.5–5.0)
Alkaline Phosphatase: 67 U/L (ref 38–126)
Anion gap: 7 (ref 5–15)
BUN: 25 mg/dL — ABNORMAL HIGH (ref 8–23)
CO2: 31 mmol/L (ref 22–32)
Calcium: 9.4 mg/dL (ref 8.9–10.3)
Chloride: 103 mmol/L (ref 98–111)
Creatinine: 1.14 mg/dL (ref 0.61–1.24)
GFR, Estimated: >60 mL/min (ref >60)
Glucose, Bld: 139 mg/dL — ABNORMAL HIGH (ref 70–99)
Potassium: 4.1 mmol/L (ref 3.5–5.1)
Sodium: 141 mmol/L (ref 135–145)
Total Bilirubin: 1.1 mg/dL (ref 0.3–1.2)
Total Protein: 6.7 g/dL (ref 6.5–8.1)

## 2022-04-10 LAB — CBC WITH DIFFERENTIAL (CANCER CENTER ONLY)
Abs Immature Granulocytes: 0.02 10*3/uL (ref 0.00–0.07)
Basophils Absolute: 0 10*3/uL (ref 0.0–0.1)
Basophils Relative: 0 %
Eosinophils Absolute: 0.2 10*3/uL (ref 0.0–0.5)
Eosinophils Relative: 4 %
HCT: 35 % — ABNORMAL LOW (ref 39.0–52.0)
Hemoglobin: 12.4 g/dL — ABNORMAL LOW (ref 13.0–17.0)
Immature Granulocytes: 1 %
Lymphocytes Relative: 18 %
Lymphs Abs: 0.8 10*3/uL (ref 0.7–4.0)
MCH: 34.9 pg — ABNORMAL HIGH (ref 26.0–34.0)
MCHC: 35.4 g/dL (ref 30.0–36.0)
MCV: 98.6 fL (ref 80.0–100.0)
Monocytes Absolute: 0.3 10*3/uL (ref 0.1–1.0)
Monocytes Relative: 6 %
Neutro Abs: 3.1 10*3/uL (ref 1.7–7.7)
Neutrophils Relative %: 71 %
Platelet Count: 93 10*3/uL — ABNORMAL LOW (ref 150–400)
RBC: 3.55 MIL/uL — ABNORMAL LOW (ref 4.22–5.81)
RDW: 12.3 % (ref 11.5–15.5)
WBC Count: 4.3 10*3/uL (ref 4.0–10.5)
nRBC: 0 % (ref 0.0–0.2)

## 2022-04-10 LAB — LACTATE DEHYDROGENASE: LDH: 271 U/L — ABNORMAL HIGH (ref 98–192)

## 2022-04-10 MED ORDER — DEXAMETHASONE 4 MG PO TABS
20.0000 mg | ORAL_TABLET | Freq: Once | ORAL | Status: AC
  Administered 2022-04-10: 20 mg via ORAL
  Filled 2022-04-10: qty 5

## 2022-04-10 MED ORDER — BORTEZOMIB CHEMO SQ INJECTION 3.5 MG (2.5MG/ML)
1.3000 mg/m2 | Freq: Once | INTRAMUSCULAR | Status: AC
  Administered 2022-04-10: 2.5 mg via SUBCUTANEOUS
  Filled 2022-04-10: qty 1

## 2022-04-10 NOTE — Patient Instructions (Signed)
[facility]  Discharge Instructions: Thank you for choosing [facility] to provide your oncology and hematology care.   If you have a lab appointment with the Cancer Center, please go directly to the Cancer Center and check in at the registration area.  Wear comfortable clothing and clothing appropriate for easy access to any Portacath or PICC line.   We strive to give you quality time with your provider. You may need to reschedule your appointment if you arrive late (15 or more minutes).  Arriving late affects you and other patients whose appointments are after yours.  Also, if you miss three or more appointments without notifying the office, you may be dismissed from the clinic at the provider's discretion.      For prescription refill requests, have your pharmacy contact our office and allow 72 hours for refills to be completed.    Today you received the following chemotherapy and/or immunotherapy agents Velcade      To help prevent nausea and vomiting after your treatment, we encourage you to take your nausea medication as directed.  BELOW ARE SYMPTOMS THAT SHOULD BE REPORTED IMMEDIATELY: *FEVER GREATER THAN 100.4 F (38 C) OR HIGHER *CHILLS OR SWEATING *NAUSEA AND VOMITING THAT IS NOT CONTROLLED WITH YOUR NAUSEA MEDICATION *UNUSUAL SHORTNESS OF BREATH *UNUSUAL BRUISING OR BLEEDING *URINARY PROBLEMS (pain or burning when urinating, or frequent urination) *BOWEL PROBLEMS (unusual diarrhea, constipation, pain near the anus) TENDERNESS IN MOUTH AND THROAT WITH OR WITHOUT PRESENCE OF ULCERS (sore throat, sores in mouth, or a toothache) UNUSUAL RASH, SWELLING OR PAIN  UNUSUAL VAGINAL DISCHARGE OR ITCHING   Items with * indicate a potential emergency and should be followed up as soon as possible or go to the Emergency Department if any problems should occur.  Please show the CHEMOTHERAPY ALERT CARD or IMMUNOTHERAPY ALERT CARD at check-in to the  Emergency Department and triage nurse. Should you have questions after your visit or need to cancel or reschedule your appointment, please contact [facility]  [phone] and follow the prompts.  Office hours are 8:00 a.m. to 4:30 p.m. Monday - Friday. Please note that voicemails left after 4:00 p.m. may not be returned until the following business day.  We are closed weekends and major holidays. You have access to a nurse at all times for urgent questions. Please call the main number to the clinic [phone] and follow the prompts.  For any non-urgent questions, you may also contact your provider using MyChart. We now offer e-Visits for anyone 18 and older to request care online for non-urgent symptoms. For details visit [email or web address].   Also download the MyChart app! Go to the app store, search "MyChart", open the app, select [facility], and log in with your MyChart username and password.  Masks are optional in the cancer centers. If you would like for your care team to wear a mask while they are taking care of you, please let them know. You may have one support person who is at least 82 years old accompany you for your appointments. 

## 2022-04-10 NOTE — Progress Notes (Signed)
Hematology and Oncology Follow Up Visit  Riley Eaton [id number] Jul 24, 1941 81 y.o. 04/10/2022   Principle Diagnosis:  IgG Kappa myeloma  -- +1q and t(11:14)  Current Therapy:   Velcade/Decadron -- ( 3 wk on/1 wk off) -- s/p cycle #6 -- start on 08/24/2021     Interim History:  Riley Eaton is back for follow-up.  Unfortunately, we just not making much headway with the Velcade/Decadron combination.  As such, I think we are going to have to add Faspro.  I think this would be a definite improvement.  I really wish that he would have responded to the Velcade/Decadron.  His light chain, which is really what we monitor, really has not come down that much.  His Kappa light chain back in May was 580 mg/L.  Most recently, the Kappa light chain early August was 430 mg/L.  Again, I think going to have to add Faspro to the Velcade.  We can still try to avoid having to put an IV in.  He still has the back issues.  He has bad scoliosis.  He has had no issues with bowels or bladder.  He has had no cough or shortness of breath.  There has been no rashes.  He has had no leg swelling.  He has had no fever.  Overall, I would have said that his performance status is probably ECOG 1.   Medications:  Current Outpatient Medications:    acetaminophen (TYLENOL) 325 MG tablet, Take 2 tablets (650 mg total) by mouth every 6 (six) hours as needed for mild pain (or temp > 100)., Disp: 20 tablet, Rfl: 0   acyclovir (ZOVIRAX) 400 MG tablet, TAKE 1 TABLET BY MOUTH TWICE A DAY, Disp: 180 tablet, Rfl: 1   allopurinol (ZYLOPRIM) 100 MG tablet, TAKE 1 TABLET BY MOUTH EVERY DAY (Patient taking differently: Take 100 mg by mouth daily.), Disp: 90 tablet, Rfl: 1   aspirin 81 MG chewable tablet, Chew 1 tablet (81 mg total) by mouth daily., Disp: 30 tablet, Rfl: 0   atorvastatin (LIPITOR) 40 MG tablet, Take 1 tablet (40 mg total) by mouth daily at 6 PM. (Patient taking differently: Take 40 mg by mouth at bedtime.), Disp:  30 tablet, Rfl: 0   carvedilol (COREG) 6.25 MG tablet, Take 1 tablet by mouth 2 (two) times daily., Disp: , Rfl:    colchicine 0.6 MG tablet, , Disp: , Rfl:    diphenhydramine-acetaminophen (TYLENOL PM) 25-500 MG TABS tablet, Take 1 tablet by mouth at bedtime., Disp: , Rfl:    ferrous sulfate 325 (65 FE) MG EC tablet, Take 325 mg by mouth daily., Disp: , Rfl:    finasteride (PROSCAR) 5 MG tablet, Take 1 tablet (5 mg total) by mouth daily. For urination, Disp: 90 tablet, Rfl: 3   furosemide (LASIX) 40 MG tablet, TAKE 1 TABLET BY MOUTH EVERY DAY, Disp: 90 tablet, Rfl: 3   HYDROcodone-acetaminophen (NORCO/VICODIN) 5-325 MG tablet, Take 1 tablet by mouth 4 (four) times daily., Disp: , Rfl:    lidocaine-prilocaine (EMLA) cream, Apply to affected area once, Disp: 30 g, Rfl: 3   LORazepam (ATIVAN) 0.5 MG tablet, TAKE 1 TABLET (0.5 MG TOTAL) BY MOUTH EVERY 6 (SIX) HOURS AS NEEDED (NAUSEA OR VOMITING)., Disp: 30 tablet, Rfl: 0   losartan (COZAAR) 25 MG tablet, Take 25 mg by mouth daily., Disp: , Rfl:    MOVANTIK 25 MG TABS tablet, Take 25 mg by mouth daily., Disp: , Rfl:    OLANZapine (ZYPREXA) 5  MG tablet, Take 1 tablet (5 mg total) by mouth daily., Disp: 30 tablet, Rfl: 0   ondansetron (ZOFRAN ODT) 4 MG disintegrating tablet, Take 1 tablet (4 mg total) by mouth every 8 (eight) hours as needed for nausea or vomiting., Disp: 20 tablet, Rfl: 0   ondansetron (ZOFRAN) 8 MG tablet, Take 1 tablet (8 mg total) by mouth 2 (two) times daily as needed (Nausea or vomiting)., Disp: 30 tablet, Rfl: 1   pantoprazole (PROTONIX) 40 MG tablet, Take 1 tablet (40 mg total) by mouth at bedtime. (Patient taking differently: Take 40 mg by mouth daily.), Disp: 30 tablet, Rfl: 0   polyethylene glycol (MIRALAX / GLYCOLAX) 17 g packet, Take 17 g by mouth daily. (Patient taking differently: Take 17 g by mouth daily as needed (constipation).), Disp: 14 each, Rfl: 0   pregabalin (LYRICA) 150 MG capsule, Take 150 mg by mouth 2 (two)  times daily., Disp: , Rfl:    prochlorperazine (COMPAZINE) 10 MG tablet, Take 1 tablet (10 mg total) by mouth every 6 (six) hours as needed (Nausea or vomiting)., Disp: 30 tablet, Rfl: 1   traZODone (DESYREL) 50 MG tablet, Take 50 mg by mouth at bedtime., Disp: , Rfl:    vitamin B-12 (CYANOCOBALAMIN) 500 MCG tablet, Take 500 mcg by mouth daily., Disp: , Rfl:   Allergies: No Known Allergies  Past Medical History, Surgical history, Social history, and Family History were reviewed and updated.  Review of Systems: Review of Systems  Constitutional: Negative.   HENT:  Negative.    Eyes: Negative.   Respiratory: Negative.    Cardiovascular: Negative.   Gastrointestinal: Negative.   Endocrine: Negative.   Genitourinary: Negative.    Musculoskeletal: Negative.   Skin: Negative.   Neurological: Negative.   Hematological: Negative.   Psychiatric/Behavioral: Negative.      Physical Exam:  weight is 192 lb 1.9 oz (87.1 kg). His oral temperature is 97.5 F (36.4 C) (abnormal). His blood pressure is 107/62 and his pulse is 58 (abnormal). His respiration is 18 and oxygen saturation is 94%.   Wt Readings from Last 3 Encounters:  04/10/22 192 lb 1.9 oz (87.1 kg)  03/13/22 192 lb (87.1 kg)  02/13/22 191 lb 12 oz (87 kg)    Physical Exam Vitals reviewed.  HENT:     Head: Normocephalic and atraumatic.  Eyes:     Pupils: Pupils are equal, round, and reactive to light.  Cardiovascular:     Rate and Rhythm: Normal rate and regular rhythm.     Heart sounds: Normal heart sounds.  Pulmonary:     Effort: Pulmonary effort is normal.     Breath sounds: Normal breath sounds.  Abdominal:     General: Bowel sounds are normal.     Palpations: Abdomen is soft.  Musculoskeletal:        General: No tenderness or deformity. Normal range of motion.     Cervical back: Normal range of motion.  Lymphadenopathy:     Cervical: No cervical adenopathy.  Skin:    General: Skin is warm and dry.      Findings: No erythema or rash.  Neurological:     Mental Status: He is alert and oriented to person, place, and time.  Psychiatric:        Behavior: Behavior normal.        Thought Content: Thought content normal.        Judgment: Judgment normal.   Lab Results  Component Value Date   WBC  4.3 04/10/2022   HGB 12.4 (L) 04/10/2022   HCT 35.0 (L) 04/10/2022   MCV 98.6 04/10/2022   PLT 93 (L) 04/10/2022     Chemistry      Component Value Date/Time   NA 141 04/10/2022 0854   NA 142 10/15/2019 1500   K 4.1 04/10/2022 0854   CL 103 04/10/2022 0854   CO2 31 04/10/2022 0854   BUN 25 (H) 04/10/2022 0854   BUN 25 10/15/2019 1500   CREATININE 1.14 04/10/2022 0854      Component Value Date/Time   CALCIUM 9.4 04/10/2022 0854   ALKPHOS 67 04/10/2022 0854   AST 20 04/10/2022 0854   ALT 14 04/10/2022 0854   BILITOT 1.1 04/10/2022 0854      Impression and Plan: Riley Eaton is a very nice 81 year old white male.  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  Again, we will have to make a change.  I just think that adding Faspro to the Velcade will make a difference.  I think he could tolerate this.  For today, we will release go with the Velcade.  We will plan to the new protocol probably in 2 or 3 weeks.  It be nice to give him some time off from treatment.  I know that Riley Eaton is doing a great job.  He really has done all that we have asked him to do.  I just wish that we would be seeing a better response.  I would like to see him back when he starts his next protocol.    [name], MD 8/30/202310:19 AM

## 2022-04-10 NOTE — Progress Notes (Signed)
DISCONTINUE ON PATHWAY REGIMEN - Multiple Myeloma and Other Plasma Cell Dyscrasias     A cycle is every 21 days:     Bortezomib      Lenalidomide      Dexamethasone   **Always confirm dose/schedule in your pharmacy ordering system**  REASON: Disease Progression PRIOR TREATMENT: [id number]: VRd (Bortezomib 1.3 mg/m2 SUBQ D1, 8, 15 + Lenalidomide 25 mg + Dexamethasone 40 mg) q21 Days x 8 Cycles TREATMENT RESPONSE: Progressive Disease (PD)  START ON PATHWAY REGIMEN - Multiple Myeloma and Other Plasma Cell Dyscrasias     Cycles 1 and 2: A cycle is every 28 days:     Lenalidomide      Dexamethasone      Daratumumab and hyaluronidase-fihj    Cycles 3 through 6: A cycle is every 28 days:     Lenalidomide      Dexamethasone      Daratumumab and hyaluronidase-fihj    Cycles 7 and beyond: A cycle is every 28 days:     Lenalidomide      Dexamethasone      Daratumumab and hyaluronidase-fihj   **Always confirm dose/schedule in your pharmacy ordering system**  Patient Characteristics: Multiple Myeloma, Newly Diagnosed, Transplant Ineligible or Refused, Standard Risk Disease Classification: Multiple Myeloma R-ISS Staging: III Therapeutic Status: Newly Diagnosed Is Patient Eligible for Transplant<= Transplant Ineligible or Refused Risk Status: Standard Risk Intent of Therapy: Curative Intent, Discussed with Patient

## 2022-04-10 NOTE — Progress Notes (Signed)
OK to treat with today's platelets value per Dr. Ennever. 

## 2022-04-11 LAB — KAPPA/LAMBDA LIGHT CHAINS
Kappa free light chain: 415.8 mg/L — ABNORMAL HIGH (ref 3.3–19.4)
Kappa, lambda light chain ratio: 19.34 — ABNORMAL HIGH (ref 0.26–1.65)
Lambda free light chains: 21.5 mg/L (ref 5.7–26.3)

## 2022-04-11 LAB — IGG, IGA, IGM
IgA: 143 mg/dL (ref 61–437)
IgG (Immunoglobin G), Serum: 971 mg/dL (ref 603–1613)
IgM (Immunoglobulin M), Srm: 49 mg/dL (ref 15–143)

## 2022-04-13 ENCOUNTER — Other Ambulatory Visit: Payer: Self-pay

## 2022-04-14 ENCOUNTER — Other Ambulatory Visit: Payer: Self-pay

## 2022-04-18 LAB — PROTEIN ELECTROPHORESIS, SERUM, WITH REFLEX
A/G Ratio: 1.7 (ref 0.7–1.7)
Albumin ELP: 3.9 g/dL (ref 2.9–4.4)
Alpha-1-Globulin: 0.2 g/dL (ref 0.0–0.4)
Alpha-2-Globulin: 0.5 g/dL (ref 0.4–1.0)
Beta Globulin: 0.7 g/dL (ref 0.7–1.3)
Gamma Globulin: 0.9 g/dL (ref 0.4–1.8)
Globulin, Total: 2.3 g/dL (ref 2.2–3.9)
M-Spike, %: 0.4 g/dL — ABNORMAL HIGH
SPEP Interpretation: 0
Total Protein ELP: 6.2 g/dL (ref 6.0–8.5)

## 2022-04-18 LAB — IMMUNOFIXATION REFLEX, SERUM
IgA: 160 mg/dL (ref 61–437)
IgG (Immunoglobin G), Serum: 1080 mg/dL (ref 603–1613)
IgM (Immunoglobulin M), Srm: 51 mg/dL (ref 15–143)

## 2022-04-23 NOTE — Progress Notes (Signed)
Pharmacist Chemotherapy Monitoring - Initial Assessment    Anticipated start date: 04/30/22   The following has been reviewed per standard work regarding the patient's treatment regimen: The patient's diagnosis, treatment plan and drug doses, and organ/hematologic function Lab orders and baseline tests specific to treatment regimen  The treatment plan start date, drug sequencing, and pre-medications Prior authorization status  Patient's documented medication list, including drug-drug interaction screen and prescriptions for anti-emetics and supportive care specific to the treatment regimen The drug concentrations, fluid compatibility, administration routes, and timing of the medications to be used The patient's access for treatment and lifetime cumulative dose history, if applicable  The patient's medication allergies and previous infusion related reactions, if applicable   Changes made to treatment plan:  treatment plan date  Follow up needed:  RBC phenotype and cross and type prior to tx needed.   Riley Eaton, [facility], 04/23/2022  11:46 AM

## 2022-04-30 ENCOUNTER — Inpatient Hospital Stay: Payer: Medicare PPO

## 2022-04-30 ENCOUNTER — Inpatient Hospital Stay: Payer: Medicare PPO | Admitting: Hematology & Oncology

## 2022-04-30 ENCOUNTER — Inpatient Hospital Stay: Payer: Medicare PPO | Attending: Hematology & Oncology

## 2022-04-30 ENCOUNTER — Encounter: Payer: Self-pay | Admitting: Hematology & Oncology

## 2022-04-30 VITALS — BP 140/80 | HR 73 | Temp 97.7°F | Resp 18

## 2022-04-30 DIAGNOSIS — D472 Monoclonal gammopathy: Secondary | ICD-10-CM

## 2022-04-30 DIAGNOSIS — Z5111 Encounter for antineoplastic chemotherapy: Secondary | ICD-10-CM | POA: Diagnosis present

## 2022-04-30 DIAGNOSIS — C9000 Multiple myeloma not having achieved remission: Secondary | ICD-10-CM | POA: Diagnosis not present

## 2022-04-30 DIAGNOSIS — Z5112 Encounter for antineoplastic immunotherapy: Secondary | ICD-10-CM | POA: Diagnosis not present

## 2022-04-30 LAB — CMP (CANCER CENTER ONLY)
ALT: 13 U/L (ref 0–44)
AST: 19 U/L (ref 15–41)
Albumin: 4.2 g/dL (ref 3.5–5.0)
Alkaline Phosphatase: 58 U/L (ref 38–126)
Anion gap: 8 (ref 5–15)
BUN: 23 mg/dL (ref 8–23)
CO2: 30 mmol/L (ref 22–32)
Calcium: 9.1 mg/dL (ref 8.9–10.3)
Chloride: 103 mmol/L (ref 98–111)
Creatinine: 1.18 mg/dL (ref 0.61–1.24)
GFR, Estimated: >60 mL/min (ref >60)
Glucose, Bld: 126 mg/dL — ABNORMAL HIGH (ref 70–99)
Potassium: 3.7 mmol/L (ref 3.5–5.1)
Sodium: 141 mmol/L (ref 135–145)
Total Bilirubin: 0.9 mg/dL (ref 0.3–1.2)
Total Protein: 6.6 g/dL (ref 6.5–8.1)

## 2022-04-30 LAB — CBC WITH DIFFERENTIAL (CANCER CENTER ONLY)
Abs Immature Granulocytes: 0.03 10*3/uL (ref 0.00–0.07)
Basophils Absolute: 0 10*3/uL (ref 0.0–0.1)
Basophils Relative: 1 %
Eosinophils Absolute: 0.1 10*3/uL (ref 0.0–0.5)
Eosinophils Relative: 3 %
HCT: 34.9 % — ABNORMAL LOW (ref 39.0–52.0)
Hemoglobin: 12.4 g/dL — ABNORMAL LOW (ref 13.0–17.0)
Immature Granulocytes: 1 %
Lymphocytes Relative: 22 %
Lymphs Abs: 0.9 10*3/uL (ref 0.7–4.0)
MCH: 34.8 pg — ABNORMAL HIGH (ref 26.0–34.0)
MCHC: 35.5 g/dL (ref 30.0–36.0)
MCV: 98 fL (ref 80.0–100.0)
Monocytes Absolute: 0.3 10*3/uL (ref 0.1–1.0)
Monocytes Relative: 8 %
Neutro Abs: 2.7 10*3/uL (ref 1.7–7.7)
Neutrophils Relative %: 65 %
Platelet Count: 83 10*3/uL — ABNORMAL LOW (ref 150–400)
RBC: 3.56 MIL/uL — ABNORMAL LOW (ref 4.22–5.81)
RDW: 12.3 % (ref 11.5–15.5)
WBC Count: 4.1 10*3/uL (ref 4.0–10.5)
nRBC: 0 % (ref 0.0–0.2)

## 2022-04-30 LAB — LACTATE DEHYDROGENASE: LDH: 264 U/L — ABNORMAL HIGH (ref 98–192)

## 2022-04-30 MED ORDER — ACETAMINOPHEN 325 MG PO TABS
650.0000 mg | ORAL_TABLET | Freq: Once | ORAL | Status: AC
  Administered 2022-04-30: 650 mg via ORAL
  Filled 2022-04-30: qty 2

## 2022-04-30 MED ORDER — DEXAMETHASONE 4 MG PO TABS
20.0000 mg | ORAL_TABLET | Freq: Once | ORAL | Status: AC
  Administered 2022-04-30: 20 mg via ORAL
  Filled 2022-04-30: qty 5

## 2022-04-30 MED ORDER — BORTEZOMIB CHEMO SQ INJECTION 3.5 MG (2.5MG/ML)
1.3000 mg/m2 | Freq: Once | INTRAMUSCULAR | Status: AC
  Administered 2022-04-30: 2.75 mg via SUBCUTANEOUS
  Filled 2022-04-30: qty 1.1

## 2022-04-30 MED ORDER — DARATUMUMAB-HYALURONIDASE-FIHJ 1800-30000 MG-UT/15ML ~~LOC~~ SOLN
1800.0000 mg | Freq: Once | SUBCUTANEOUS | Status: AC
  Administered 2022-04-30: 1800 mg via SUBCUTANEOUS
  Filled 2022-04-30: qty 15

## 2022-04-30 MED ORDER — DIPHENHYDRAMINE HCL 25 MG PO CAPS
50.0000 mg | ORAL_CAPSULE | Freq: Once | ORAL | Status: AC
  Administered 2022-04-30: 25 mg via ORAL
  Filled 2022-04-30: qty 2

## 2022-04-30 MED ORDER — MONTELUKAST SODIUM 10 MG PO TABS
10.0000 mg | ORAL_TABLET | Freq: Once | ORAL | Status: AC
  Administered 2022-04-30: 10 mg via ORAL
  Filled 2022-04-30: qty 1

## 2022-04-30 NOTE — Progress Notes (Signed)
Pharmacist Chemotherapy Monitoring - Initial Assessment    Anticipated start date: 05/07/22   The following has been reviewed per standard work regarding the patient's treatment regimen: The patient's diagnosis, treatment plan and drug doses, and organ/hematologic function Lab orders and baseline tests specific to treatment regimen  The treatment plan start date, drug sequencing, and pre-medications Prior authorization status  Patient's documented medication list, including drug-drug interaction screen and prescriptions for anti-emetics and supportive care specific to the treatment regimen The drug concentrations, fluid compatibility, administration routes, and timing of the medications to be used The patient's access for treatment and lifetime cumulative dose history, if applicable  The patient's medication allergies and previous infusion related reactions, if applicable   Changes made to treatment plan:  N/A  Follow up needed:  Make sure cross and screen and rbc phenotype drawn prior to first treatment.   Riley Eaton, [place], 04/30/2022  9:05 AM

## 2022-04-30 NOTE — Patient Instructions (Signed)
[facility]  Discharge Instructions: Thank you for choosing [facility] to provide your oncology and hematology care.   If you have a lab appointment with the Cancer Center, please go directly to the Cancer Center and check in at the registration area.  Wear comfortable clothing and clothing appropriate for easy access to any Portacath or PICC line.   We strive to give you quality time with your provider. You may need to reschedule your appointment if you arrive late (15 or more minutes).  Arriving late affects you and other patients whose appointments are after yours.  Also, if you miss three or more appointments without notifying the office, you may be dismissed from the clinic at the provider's discretion.      For prescription refill requests, have your pharmacy contact our office and allow 72 hours for refills to be completed.    Today you received the following chemotherapy and/or immunotherapy agents Velcade and Faspro    To help prevent nausea and vomiting after your treatment, we encourage you to take your nausea medication as directed.  BELOW ARE SYMPTOMS THAT SHOULD BE REPORTED IMMEDIATELY: *FEVER GREATER THAN 100.4 F (38 C) OR HIGHER *CHILLS OR SWEATING *NAUSEA AND VOMITING THAT IS NOT CONTROLLED WITH YOUR NAUSEA MEDICATION *UNUSUAL SHORTNESS OF BREATH *UNUSUAL BRUISING OR BLEEDING *URINARY PROBLEMS (pain or burning when urinating, or frequent urination) *BOWEL PROBLEMS (unusual diarrhea, constipation, pain near the anus) TENDERNESS IN MOUTH AND THROAT WITH OR WITHOUT PRESENCE OF ULCERS (sore throat, sores in mouth, or a toothache) UNUSUAL RASH, SWELLING OR PAIN  UNUSUAL VAGINAL DISCHARGE OR ITCHING   Items with * indicate a potential emergency and should be followed up as soon as possible or go to the Emergency Department if any problems should occur.  Please show the CHEMOTHERAPY ALERT CARD or IMMUNOTHERAPY ALERT CARD at check-in  to the Emergency Department and triage nurse. Should you have questions after your visit or need to cancel or reschedule your appointment, please contact [facility]  [phone] and follow the prompts.  Office hours are 8:00 a.m. to 4:30 p.m. Monday - Friday. Please note that voicemails left after 4:00 p.m. may not be returned until the following business day.  We are closed weekends and major holidays. You have access to a nurse at all times for urgent questions. Please call the main number to the clinic [phone] and follow the prompts.  For any non-urgent questions, you may also contact your provider using MyChart. We now offer e-Visits for anyone 18 and older to request care online for non-urgent symptoms. For details visit [email or web address].   Also download the MyChart app! Go to the app store, search "MyChart", open the app, select [facility], and log in with your MyChart username and password.  Masks are optional in the cancer centers. If you would like for your care team to wear a mask while they are taking care of you, please let them know. You may have one support person who is at least 82 years old accompany you for your appointments. 

## 2022-04-30 NOTE — Progress Notes (Signed)
OK to treat with platelet count of 83 per order of Dr. Marin Olp.

## 2022-05-01 ENCOUNTER — Telehealth: Payer: Self-pay

## 2022-05-01 ENCOUNTER — Encounter: Payer: Self-pay | Admitting: Hematology & Oncology

## 2022-05-01 LAB — IGG, IGA, IGM
IgA: 139 mg/dL (ref 61–437)
IgG (Immunoglobin G), Serum: 941 mg/dL (ref 603–1613)
IgM (Immunoglobulin M), Srm: 51 mg/dL (ref 15–143)

## 2022-05-01 LAB — TYPE AND SCREEN
ABO/RH(D): A POS
Antibody Screen: NEGATIVE
DAT, IgG: NEGATIVE

## 2022-05-01 LAB — KAPPA/LAMBDA LIGHT CHAINS
Kappa free light chain: 370.2 mg/L — ABNORMAL HIGH (ref 3.3–19.4)
Kappa, lambda light chain ratio: 16.1 — ABNORMAL HIGH (ref 0.26–1.65)
Lambda free light chains: 23 mg/L (ref 5.7–26.3)

## 2022-05-01 LAB — PRETREATMENT RBC PHENOTYPE

## 2022-05-01 NOTE — Progress Notes (Signed)
Hematology and Oncology Follow Up Visit  Riley Eaton [id number] 1941-01-12 81 y.o. 05/01/2022   Principle Diagnosis:  IgG Kappa myeloma  -- +1q and t(11:14)  Current Therapy:   Velcade/Decadron -- ( 3 wk on/1 wk off) -- s/p cycle #7 -- start on 08/24/2021 Faspro -  cycle #1 started on 04/30/2022     Interim History:  Riley Eaton is back for follow-up.  We are going to add Faspro to the Velcade/Decadron.  When we last saw him in August, his Kappa light chain was 42 mg/dL.  This just is not good enough after how much chemotherapy has had so far.  I really think that Huel Cote will be able to help decrease the myeloma burden.  His IgG level has been doing okay.  He is still stable at 1080 mg/dL.    His monoclonal spike back in August was 0.4 g/dL.  He feels okay.  He has had no problems with nausea or vomiting.  He has had no cough or shortness of breath.  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  He has had no leg swelling.  He has had no problems with bowels or bladder.  He has had no fever.  He has had no bleeding.  There has been no rashes.  Overall, I would say his performance status is probably ECOG 2.    Medications:  Current Outpatient Medications:    acetaminophen (TYLENOL) 325 MG tablet, Take 2 tablets (650 mg total) by mouth every 6 (six) hours as needed for mild pain (or temp > 100)., Disp: 20 tablet, Rfl: 0   allopurinol (ZYLOPRIM) 100 MG tablet, TAKE 1 TABLET BY MOUTH EVERY DAY (Patient taking differently: Take 100 mg by mouth daily.), Disp: 90 tablet, Rfl: 1   aspirin 81 MG chewable tablet, Chew 1 tablet (81 mg total) by mouth daily., Disp: 30 tablet, Rfl: 0   atorvastatin (LIPITOR) 40 MG tablet, Take 1 tablet (40 mg total) by mouth daily at 6 PM. (Patient taking differently: Take 40 mg by mouth at bedtime.), Disp: 30 tablet, Rfl: 0   carvedilol (COREG) 6.25 MG tablet, Take 1 tablet by mouth 2 (two) times daily., Disp: , Rfl:    diphenhydramine-acetaminophen (TYLENOL PM)  25-500 MG TABS tablet, Take 1 tablet by mouth at bedtime., Disp: , Rfl:    ferrous sulfate 325 (65 FE) MG EC tablet, Take 325 mg by mouth daily., Disp: , Rfl:    finasteride (PROSCAR) 5 MG tablet, Take 1 tablet (5 mg total) by mouth daily. For urination, Disp: 90 tablet, Rfl: 3   furosemide (LASIX) 40 MG tablet, TAKE 1 TABLET BY MOUTH EVERY DAY, Disp: 90 tablet, Rfl: 3   HYDROcodone-acetaminophen (NORCO/VICODIN) 5-325 MG tablet, Take 1 tablet by mouth 4 (four) times daily., Disp: , Rfl:    losartan (COZAAR) 25 MG tablet, Take 25 mg by mouth daily., Disp: , Rfl:    MOVANTIK 25 MG TABS tablet, Take 25 mg by mouth daily., Disp: , Rfl:    OLANZapine (ZYPREXA) 5 MG tablet, Take 1 tablet (5 mg total) by mouth daily., Disp: 30 tablet, Rfl: 0   ondansetron (ZOFRAN ODT) 4 MG disintegrating tablet, Take 1 tablet (4 mg total) by mouth every 8 (eight) hours as needed for nausea or vomiting., Disp: 20 tablet, Rfl: 0   pantoprazole (PROTONIX) 40 MG tablet, Take 1 tablet (40 mg total) by mouth at bedtime. (Patient taking differently: Take 40 mg by mouth daily.), Disp: 30 tablet, Rfl: 0   pregabalin (  LYRICA) 150 MG capsule, Take 150 mg by mouth 2 (two) times daily., Disp: , Rfl:    traZODone (DESYREL) 50 MG tablet, Take 50 mg by mouth at bedtime., Disp: , Rfl:    vitamin B-12 (CYANOCOBALAMIN) 500 MCG tablet, Take 500 mcg by mouth daily., Disp: , Rfl:    colchicine 0.6 MG tablet, , Disp: , Rfl:    polyethylene glycol (MIRALAX / GLYCOLAX) 17 g packet, Take 17 g by mouth daily. (Patient taking differently: Take 17 g by mouth daily as needed (constipation).), Disp: 14 each, Rfl: 0  Allergies: No Known Allergies  Past Medical History, Surgical history, Social history, and Family History were reviewed and updated.  Review of Systems: Review of Systems  Constitutional: Negative.   HENT:  Negative.    Eyes: Negative.   Respiratory: Negative.    Cardiovascular: Negative.   Gastrointestinal: Negative.   Endocrine:  Negative.   Genitourinary: Negative.    Musculoskeletal: Negative.   Skin: Negative.   Neurological: Negative.   Hematological: Negative.   Psychiatric/Behavioral: Negative.      Physical Exam:  height is '5\' 7"'$  (1.702 m) and weight is 191 lb 4 oz (86.8 kg). His oral temperature is 97.5 F (36.4 C) (abnormal). His blood pressure is 94/55 (abnormal) and his pulse is 58 (abnormal). His respiration is 18 and oxygen saturation is 95%.   Wt Readings from Last 3 Encounters:  04/30/22 191 lb 4 oz (86.8 kg)  04/10/22 192 lb 1.9 oz (87.1 kg)  03/13/22 192 lb (87.1 kg)    Physical Exam Vitals reviewed.  HENT:     Head: Normocephalic and atraumatic.  Eyes:     Pupils: Pupils are equal, round, and reactive to light.  Cardiovascular:     Rate and Rhythm: Normal rate and regular rhythm.     Heart sounds: Normal heart sounds.  Pulmonary:     Effort: Pulmonary effort is normal.     Breath sounds: Normal breath sounds.  Abdominal:     General: Bowel sounds are normal.     Palpations: Abdomen is soft.  Musculoskeletal:        General: No tenderness or deformity. Normal range of motion.     Cervical back: Normal range of motion.  Lymphadenopathy:     Cervical: No cervical adenopathy.  Skin:    General: Skin is warm and dry.     Findings: No erythema or rash.  Neurological:     Mental Status: He is alert and oriented to person, place, and time.  Psychiatric:        Behavior: Behavior normal.        Thought Content: Thought content normal.        Judgment: Judgment normal.    Lab Results  Component Value Date   WBC 4.1 04/30/2022   HGB 12.4 (L) 04/30/2022   HCT 34.9 (L) 04/30/2022   MCV 98.0 04/30/2022   PLT 83 (L) 04/30/2022     Chemistry      Component Value Date/Time   NA 141 04/30/2022 0832   NA 142 10/15/2019 1500   K 3.7 04/30/2022 0832   CL 103 04/30/2022 0832   CO2 30 04/30/2022 0832   BUN 23 04/30/2022 0832   BUN 25 10/15/2019 1500   CREATININE 1.18 04/30/2022  0832      Component Value Date/Time   CALCIUM 9.1 04/30/2022 0832   ALKPHOS 58 04/30/2022 0832   AST 19 04/30/2022 0832   ALT 13 04/30/2022 7915  BILITOT 0.9 04/30/2022 2956      Impression and Plan: Riley Eaton is a very nice 82 year old white male.  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  We will start the Faspro today.  I really think this will help [place] out.  It is subcutaneous so this will make life easier for him.  We will have to follow-up with his myeloma levels.  Clearly, it is a Kappa light chain that tells [place] how things are working with treatment.  We will plan to get him back in another month.  I know he is trying hard.  He really has a good attitude.  He has a lot of support at home which makes it easier for him.    [name], MD 9/20/20237:18 AM

## 2022-05-01 NOTE — Telephone Encounter (Addendum)
Patient notified of attached message and verbalizes understanding and appreciation. dph  ----- Message from [name], MD sent at 05/01/2022  2:59 PM EDT ----- Call let him know that the light chain is slowly coming down.  Hopefully with the new Faspro that we added, the level will come down even quicker.  Riley Eaton

## 2022-05-02 ENCOUNTER — Other Ambulatory Visit: Payer: Self-pay

## 2022-05-07 ENCOUNTER — Inpatient Hospital Stay: Payer: Medicare PPO

## 2022-05-07 VITALS — BP 120/81 | HR 55 | Temp 97.9°F | Resp 19

## 2022-05-07 DIAGNOSIS — D472 Monoclonal gammopathy: Secondary | ICD-10-CM

## 2022-05-07 DIAGNOSIS — Z5111 Encounter for antineoplastic chemotherapy: Secondary | ICD-10-CM | POA: Diagnosis not present

## 2022-05-07 LAB — PROTEIN ELECTROPHORESIS, SERUM, WITH REFLEX
A/G Ratio: 1.7 (ref 0.7–1.7)
Albumin ELP: 3.8 g/dL (ref 2.9–4.4)
Alpha-1-Globulin: 0.2 g/dL (ref 0.0–0.4)
Alpha-2-Globulin: 0.4 g/dL (ref 0.4–1.0)
Beta Globulin: 0.7 g/dL (ref 0.7–1.3)
Gamma Globulin: 0.9 g/dL (ref 0.4–1.8)
Globulin, Total: 2.3 g/dL (ref 2.2–3.9)
M-Spike, %: 0.4 g/dL — ABNORMAL HIGH
SPEP Interpretation: 0
Total Protein ELP: 6.1 g/dL (ref 6.0–8.5)

## 2022-05-07 LAB — CBC WITH DIFFERENTIAL (CANCER CENTER ONLY)
Abs Immature Granulocytes: 0.02 10*3/uL (ref 0.00–0.07)
Basophils Absolute: 0 10*3/uL (ref 0.0–0.1)
Basophils Relative: 0 %
Eosinophils Absolute: 0.3 10*3/uL (ref 0.0–0.5)
Eosinophils Relative: 8 %
HCT: 33.6 % — ABNORMAL LOW (ref 39.0–52.0)
Hemoglobin: 11.9 g/dL — ABNORMAL LOW (ref 13.0–17.0)
Immature Granulocytes: 1 %
Lymphocytes Relative: 21 %
Lymphs Abs: 0.9 10*3/uL (ref 0.7–4.0)
MCH: 35 pg — ABNORMAL HIGH (ref 26.0–34.0)
MCHC: 35.4 g/dL (ref 30.0–36.0)
MCV: 98.8 fL (ref 80.0–100.0)
Monocytes Absolute: 0.4 10*3/uL (ref 0.1–1.0)
Monocytes Relative: 10 %
Neutro Abs: 2.5 10*3/uL (ref 1.7–7.7)
Neutrophils Relative %: 60 %
Platelet Count: 81 10*3/uL — ABNORMAL LOW (ref 150–400)
RBC: 3.4 MIL/uL — ABNORMAL LOW (ref 4.22–5.81)
RDW: 12.4 % (ref 11.5–15.5)
WBC Count: 4.1 10*3/uL (ref 4.0–10.5)
nRBC: 0 % (ref 0.0–0.2)

## 2022-05-07 LAB — CMP (CANCER CENTER ONLY)
ALT: 17 U/L (ref 0–44)
AST: 18 U/L (ref 15–41)
Albumin: 4 g/dL (ref 3.5–5.0)
Alkaline Phosphatase: 62 U/L (ref 38–126)
Anion gap: 6 (ref 5–15)
BUN: 27 mg/dL — ABNORMAL HIGH (ref 8–23)
CO2: 30 mmol/L (ref 22–32)
Calcium: 8.9 mg/dL (ref 8.9–10.3)
Chloride: 103 mmol/L (ref 98–111)
Creatinine: 1.26 mg/dL — ABNORMAL HIGH (ref 0.61–1.24)
GFR, Estimated: 57 mL/min — ABNORMAL LOW (ref >60)
Glucose, Bld: 171 mg/dL — ABNORMAL HIGH (ref 70–99)
Potassium: 4.1 mmol/L (ref 3.5–5.1)
Sodium: 139 mmol/L (ref 135–145)
Total Bilirubin: 0.8 mg/dL (ref 0.3–1.2)
Total Protein: 6 g/dL — ABNORMAL LOW (ref 6.5–8.1)

## 2022-05-07 LAB — IMMUNOFIXATION REFLEX, SERUM
IgA: 145 mg/dL (ref 61–437)
IgG (Immunoglobin G), Serum: 1006 mg/dL (ref 603–1613)
IgM (Immunoglobulin M), Srm: 52 mg/dL (ref 15–143)

## 2022-05-07 MED ORDER — DEXAMETHASONE 4 MG PO TABS
20.0000 mg | ORAL_TABLET | Freq: Once | ORAL | Status: AC
  Administered 2022-05-07: 20 mg via ORAL
  Filled 2022-05-07: qty 5

## 2022-05-07 MED ORDER — DIPHENHYDRAMINE HCL 25 MG PO CAPS
25.0000 mg | ORAL_CAPSULE | Freq: Once | ORAL | Status: AC
  Administered 2022-05-07: 25 mg via ORAL
  Filled 2022-05-07: qty 1

## 2022-05-07 MED ORDER — DARATUMUMAB-HYALURONIDASE-FIHJ 1800-30000 MG-UT/15ML ~~LOC~~ SOLN
1800.0000 mg | Freq: Once | SUBCUTANEOUS | Status: AC
  Administered 2022-05-07: 1800 mg via SUBCUTANEOUS
  Filled 2022-05-07: qty 15

## 2022-05-07 MED ORDER — ACETAMINOPHEN 325 MG PO TABS
650.0000 mg | ORAL_TABLET | Freq: Once | ORAL | Status: AC
  Administered 2022-05-07: 650 mg via ORAL
  Filled 2022-05-07: qty 2

## 2022-05-07 MED ORDER — BORTEZOMIB CHEMO SQ INJECTION 3.5 MG (2.5MG/ML)
1.3000 mg/m2 | Freq: Once | INTRAMUSCULAR | Status: AC
  Administered 2022-05-07: 2.75 mg via SUBCUTANEOUS
  Filled 2022-05-07: qty 1.1

## 2022-05-07 MED ORDER — MONTELUKAST SODIUM 10 MG PO TABS
10.0000 mg | ORAL_TABLET | Freq: Once | ORAL | Status: AC
  Administered 2022-05-07: 10 mg via ORAL
  Filled 2022-05-07: qty 1

## 2022-05-07 NOTE — Progress Notes (Signed)
Ok to treat with platelets of 81 per Dr Ennever. dph 

## 2022-05-13 ENCOUNTER — Other Ambulatory Visit: Payer: Self-pay

## 2022-05-14 ENCOUNTER — Inpatient Hospital Stay: Payer: Medicare PPO | Attending: Hematology & Oncology

## 2022-05-14 ENCOUNTER — Inpatient Hospital Stay: Payer: Medicare PPO

## 2022-05-14 VITALS — BP 100/69 | HR 49 | Temp 97.8°F | Resp 18

## 2022-05-14 DIAGNOSIS — C9000 Multiple myeloma not having achieved remission: Secondary | ICD-10-CM | POA: Insufficient documentation

## 2022-05-14 DIAGNOSIS — D472 Monoclonal gammopathy: Secondary | ICD-10-CM

## 2022-05-14 DIAGNOSIS — Z5111 Encounter for antineoplastic chemotherapy: Secondary | ICD-10-CM | POA: Insufficient documentation

## 2022-05-14 DIAGNOSIS — Z5112 Encounter for antineoplastic immunotherapy: Secondary | ICD-10-CM | POA: Insufficient documentation

## 2022-05-14 LAB — CBC WITH DIFFERENTIAL (CANCER CENTER ONLY)
Abs Immature Granulocytes: 0.01 10*3/uL (ref 0.00–0.07)
Basophils Absolute: 0 10*3/uL (ref 0.0–0.1)
Basophils Relative: 0 %
Eosinophils Absolute: 0.2 10*3/uL (ref 0.0–0.5)
Eosinophils Relative: 5 %
HCT: 34.6 % — ABNORMAL LOW (ref 39.0–52.0)
Hemoglobin: 12.3 g/dL — ABNORMAL LOW (ref 13.0–17.0)
Immature Granulocytes: 0 %
Lymphocytes Relative: 21 %
Lymphs Abs: 0.8 10*3/uL (ref 0.7–4.0)
MCH: 35.2 pg — ABNORMAL HIGH (ref 26.0–34.0)
MCHC: 35.5 g/dL (ref 30.0–36.0)
MCV: 99.1 fL (ref 80.0–100.0)
Monocytes Absolute: 0.3 10*3/uL (ref 0.1–1.0)
Monocytes Relative: 9 %
Neutro Abs: 2.4 10*3/uL (ref 1.7–7.7)
Neutrophils Relative %: 65 %
Platelet Count: 81 10*3/uL — ABNORMAL LOW (ref 150–400)
RBC: 3.49 MIL/uL — ABNORMAL LOW (ref 4.22–5.81)
RDW: 12.6 % (ref 11.5–15.5)
WBC Count: 3.7 10*3/uL — ABNORMAL LOW (ref 4.0–10.5)
nRBC: 0 % (ref 0.0–0.2)

## 2022-05-14 LAB — CMP (CANCER CENTER ONLY)
ALT: 16 U/L (ref 0–44)
AST: 19 U/L (ref 15–41)
Albumin: 4.1 g/dL (ref 3.5–5.0)
Alkaline Phosphatase: 59 U/L (ref 38–126)
Anion gap: 6 (ref 5–15)
BUN: 29 mg/dL — ABNORMAL HIGH (ref 8–23)
CO2: 29 mmol/L (ref 22–32)
Calcium: 9.2 mg/dL (ref 8.9–10.3)
Chloride: 104 mmol/L (ref 98–111)
Creatinine: 1.45 mg/dL — ABNORMAL HIGH (ref 0.61–1.24)
GFR, Estimated: 48 mL/min — ABNORMAL LOW (ref >60)
Glucose, Bld: 133 mg/dL — ABNORMAL HIGH (ref 70–99)
Potassium: 4.4 mmol/L (ref 3.5–5.1)
Sodium: 139 mmol/L (ref 135–145)
Total Bilirubin: 0.8 mg/dL (ref 0.3–1.2)
Total Protein: 6.1 g/dL — ABNORMAL LOW (ref 6.5–8.1)

## 2022-05-14 MED ORDER — DIPHENHYDRAMINE HCL 25 MG PO CAPS
25.0000 mg | ORAL_CAPSULE | Freq: Once | ORAL | Status: AC
  Administered 2022-05-14: 25 mg via ORAL
  Filled 2022-05-14: qty 1

## 2022-05-14 MED ORDER — MONTELUKAST SODIUM 10 MG PO TABS
10.0000 mg | ORAL_TABLET | Freq: Once | ORAL | Status: AC
  Administered 2022-05-14: 10 mg via ORAL
  Filled 2022-05-14: qty 1

## 2022-05-14 MED ORDER — ACETAMINOPHEN 325 MG PO TABS
650.0000 mg | ORAL_TABLET | Freq: Once | ORAL | Status: AC
  Administered 2022-05-14: 650 mg via ORAL
  Filled 2022-05-14: qty 2

## 2022-05-14 MED ORDER — DARATUMUMAB-HYALURONIDASE-FIHJ 1800-30000 MG-UT/15ML ~~LOC~~ SOLN
1800.0000 mg | Freq: Once | SUBCUTANEOUS | Status: AC
  Administered 2022-05-14: 1800 mg via SUBCUTANEOUS
  Filled 2022-05-14: qty 15

## 2022-05-14 MED ORDER — DEXAMETHASONE 4 MG PO TABS
20.0000 mg | ORAL_TABLET | Freq: Once | ORAL | Status: AC
  Administered 2022-05-14: 20 mg via ORAL
  Filled 2022-05-14: qty 5

## 2022-05-14 NOTE — Progress Notes (Signed)
Ok to treat with platelets of 81 per Dr Ennever. dph 

## 2022-05-14 NOTE — Patient Instructions (Signed)
[facility]  Discharge Instructions: Thank you for choosing [facility] to provide your oncology and hematology care.   If you have a lab appointment with the Cancer Center, please go directly to the Cancer Center and check in at the registration area.  Wear comfortable clothing and clothing appropriate for easy access to any Portacath or PICC line.   We strive to give you quality time with your provider. You may need to reschedule your appointment if you arrive late (15 or more minutes).  Arriving late affects you and other patients whose appointments are after yours.  Also, if you miss three or more appointments without notifying the office, you may be dismissed from the clinic at the provider's discretion.      For prescription refill requests, have your pharmacy contact our office and allow 72 hours for refills to be completed.    Today you received the following chemotherapy and/or immunotherapy agents Darzalex      To help prevent nausea and vomiting after your treatment, we encourage you to take your nausea medication as directed.  BELOW ARE SYMPTOMS THAT SHOULD BE REPORTED IMMEDIATELY: *FEVER GREATER THAN 100.4 F (38 C) OR HIGHER *CHILLS OR SWEATING *NAUSEA AND VOMITING THAT IS NOT CONTROLLED WITH YOUR NAUSEA MEDICATION *UNUSUAL SHORTNESS OF BREATH *UNUSUAL BRUISING OR BLEEDING *URINARY PROBLEMS (pain or burning when urinating, or frequent urination) *BOWEL PROBLEMS (unusual diarrhea, constipation, pain near the anus) TENDERNESS IN MOUTH AND THROAT WITH OR WITHOUT PRESENCE OF ULCERS (sore throat, sores in mouth, or a toothache) UNUSUAL RASH, SWELLING OR PAIN  UNUSUAL VAGINAL DISCHARGE OR ITCHING   Items with * indicate a potential emergency and should be followed up as soon as possible or go to the Emergency Department if any problems should occur.  Please show the CHEMOTHERAPY ALERT CARD or IMMUNOTHERAPY ALERT CARD at check-in to the  Emergency Department and triage nurse. Should you have questions after your visit or need to cancel or reschedule your appointment, please contact [facility]  [phone] and follow the prompts.  Office hours are 8:00 a.m. to 4:30 p.m. Monday - Friday. Please note that voicemails left after 4:00 p.m. may not be returned until the following business day.  We are closed weekends and major holidays. You have access to a nurse at all times for urgent questions. Please call the main number to the clinic [phone] and follow the prompts.  For any non-urgent questions, you may also contact your provider using MyChart. We now offer e-Visits for anyone 18 and older to request care online for non-urgent symptoms. For details visit [email or web address].   Also download the MyChart app! Go to the app store, search "MyChart", open the app, select [facility], and log in with your MyChart username and password.  Masks are optional in the cancer centers. If you would like for your care team to wear a mask while they are taking care of you, please let them know. You may have one support person who is at least 82 years old accompany you for your appointments. 

## 2022-05-28 ENCOUNTER — Other Ambulatory Visit: Payer: Self-pay

## 2022-05-28 ENCOUNTER — Inpatient Hospital Stay: Payer: Medicare PPO | Admitting: Hematology & Oncology

## 2022-05-28 ENCOUNTER — Encounter: Payer: Self-pay | Admitting: Hematology & Oncology

## 2022-05-28 ENCOUNTER — Inpatient Hospital Stay: Payer: Medicare PPO

## 2022-05-28 VITALS — BP 96/62 | HR 56

## 2022-05-28 VITALS — BP 88/58 | HR 57 | Temp 98.2°F | Resp 19 | Ht 67.0 in | Wt 190.8 lb

## 2022-05-28 DIAGNOSIS — C9000 Multiple myeloma not having achieved remission: Secondary | ICD-10-CM | POA: Diagnosis not present

## 2022-05-28 DIAGNOSIS — D472 Monoclonal gammopathy: Secondary | ICD-10-CM

## 2022-05-28 DIAGNOSIS — Z5111 Encounter for antineoplastic chemotherapy: Secondary | ICD-10-CM | POA: Diagnosis not present

## 2022-05-28 LAB — CMP (CANCER CENTER ONLY)
ALT: 16 U/L (ref 0–44)
AST: 22 U/L (ref 15–41)
Albumin: 4.2 g/dL (ref 3.5–5.0)
Alkaline Phosphatase: 85 U/L (ref 38–126)
Anion gap: 10 (ref 5–15)
BUN: 22 mg/dL (ref 8–23)
CO2: 27 mmol/L (ref 22–32)
Calcium: 9 mg/dL (ref 8.9–10.3)
Chloride: 103 mmol/L (ref 98–111)
Creatinine: 1.23 mg/dL (ref 0.61–1.24)
GFR, Estimated: 59 mL/min — ABNORMAL LOW (ref >60)
Glucose, Bld: 110 mg/dL — ABNORMAL HIGH (ref 70–99)
Potassium: 4 mmol/L (ref 3.5–5.1)
Sodium: 140 mmol/L (ref 135–145)
Total Bilirubin: 0.8 mg/dL (ref 0.3–1.2)
Total Protein: 6.2 g/dL — ABNORMAL LOW (ref 6.5–8.1)

## 2022-05-28 LAB — CBC WITH DIFFERENTIAL (CANCER CENTER ONLY)
Abs Immature Granulocytes: 0.03 10*3/uL (ref 0.00–0.07)
Basophils Absolute: 0 10*3/uL (ref 0.0–0.1)
Basophils Relative: 0 %
Eosinophils Absolute: 0.2 10*3/uL (ref 0.0–0.5)
Eosinophils Relative: 4 %
HCT: 34.3 % — ABNORMAL LOW (ref 39.0–52.0)
Hemoglobin: 12.2 g/dL — ABNORMAL LOW (ref 13.0–17.0)
Immature Granulocytes: 1 %
Lymphocytes Relative: 21 %
Lymphs Abs: 0.8 10*3/uL (ref 0.7–4.0)
MCH: 34.8 pg — ABNORMAL HIGH (ref 26.0–34.0)
MCHC: 35.6 g/dL (ref 30.0–36.0)
MCV: 97.7 fL (ref 80.0–100.0)
Monocytes Absolute: 0.3 10*3/uL (ref 0.1–1.0)
Monocytes Relative: 7 %
Neutro Abs: 2.6 10*3/uL (ref 1.7–7.7)
Neutrophils Relative %: 67 %
Platelet Count: 67 10*3/uL — ABNORMAL LOW (ref 150–400)
RBC: 3.51 MIL/uL — ABNORMAL LOW (ref 4.22–5.81)
RDW: 12.6 % (ref 11.5–15.5)
WBC Count: 3.9 10*3/uL — ABNORMAL LOW (ref 4.0–10.5)
nRBC: 0 % (ref 0.0–0.2)

## 2022-05-28 LAB — LACTATE DEHYDROGENASE: LDH: 295 U/L — ABNORMAL HIGH (ref 98–192)

## 2022-05-28 MED ORDER — DIPHENHYDRAMINE HCL 25 MG PO CAPS
25.0000 mg | ORAL_CAPSULE | Freq: Once | ORAL | Status: AC
  Administered 2022-05-28: 25 mg via ORAL
  Filled 2022-05-28: qty 1

## 2022-05-28 MED ORDER — BORTEZOMIB CHEMO SQ INJECTION 3.5 MG (2.5MG/ML)
1.3000 mg/m2 | Freq: Once | INTRAMUSCULAR | Status: AC
  Administered 2022-05-28: 2.75 mg via SUBCUTANEOUS
  Filled 2022-05-28: qty 1.1

## 2022-05-28 MED ORDER — ACETAMINOPHEN 325 MG PO TABS
650.0000 mg | ORAL_TABLET | Freq: Once | ORAL | Status: AC
  Administered 2022-05-28: 650 mg via ORAL
  Filled 2022-05-28: qty 2

## 2022-05-28 MED ORDER — DARATUMUMAB-HYALURONIDASE-FIHJ 1800-30000 MG-UT/15ML ~~LOC~~ SOLN
1800.0000 mg | Freq: Once | SUBCUTANEOUS | Status: AC
  Administered 2022-05-28: 1800 mg via SUBCUTANEOUS
  Filled 2022-05-28: qty 15

## 2022-05-28 MED ORDER — DEXAMETHASONE 4 MG PO TABS
20.0000 mg | ORAL_TABLET | Freq: Once | ORAL | Status: AC
  Administered 2022-05-28: 20 mg via ORAL
  Filled 2022-05-28: qty 5

## 2022-05-28 NOTE — Patient Instructions (Signed)
[facility]  Discharge Instructions: Thank you for choosing [facility] to provide your oncology and hematology care.   If you have a lab appointment with the Cancer Center, please go directly to the Cancer Center and check in at the registration area.  Wear comfortable clothing and clothing appropriate for easy access to any Portacath or PICC line.   We strive to give you quality time with your provider. You may need to reschedule your appointment if you arrive late (15 or more minutes).  Arriving late affects you and other patients whose appointments are after yours.  Also, if you miss three or more appointments without notifying the office, you may be dismissed from the clinic at the provider's discretion.      For prescription refill requests, have your pharmacy contact our office and allow 72 hours for refills to be completed.    Today you received the following chemotherapy and/or immunotherapy agents Velcade, Faspro      To help prevent nausea and vomiting after your treatment, we encourage you to take your nausea medication as directed.  BELOW ARE SYMPTOMS THAT SHOULD BE REPORTED IMMEDIATELY: *FEVER GREATER THAN 100.4 F (38 C) OR HIGHER *CHILLS OR SWEATING *NAUSEA AND VOMITING THAT IS NOT CONTROLLED WITH YOUR NAUSEA MEDICATION *UNUSUAL SHORTNESS OF BREATH *UNUSUAL BRUISING OR BLEEDING *URINARY PROBLEMS (pain or burning when urinating, or frequent urination) *BOWEL PROBLEMS (unusual diarrhea, constipation, pain near the anus) TENDERNESS IN MOUTH AND THROAT WITH OR WITHOUT PRESENCE OF ULCERS (sore throat, sores in mouth, or a toothache) UNUSUAL RASH, SWELLING OR PAIN  UNUSUAL VAGINAL DISCHARGE OR ITCHING   Items with * indicate a potential emergency and should be followed up as soon as possible or go to the Emergency Department if any problems should occur.  Please show the CHEMOTHERAPY ALERT CARD or IMMUNOTHERAPY ALERT CARD at check-in to  the Emergency Department and triage nurse. Should you have questions after your visit or need to cancel or reschedule your appointment, please contact [facility]  [phone] and follow the prompts.  Office hours are 8:00 a.m. to 4:30 p.m. Monday - Friday. Please note that voicemails left after 4:00 p.m. may not be returned until the following business day.  We are closed weekends and major holidays. You have access to a nurse at all times for urgent questions. Please call the main number to the clinic [phone] and follow the prompts.  For any non-urgent questions, you may also contact your provider using MyChart. We now offer e-Visits for anyone 18 and older to request care online for non-urgent symptoms. For details visit [email or web address].   Also download the MyChart app! Go to the app store, search "MyChart", open the app, select [facility], and log in with your MyChart username and password.  Masks are optional in the cancer centers. If you would like for your care team to wear a mask while they are taking care of you, please let them know. You may have one support person who is at least 82 years old accompany you for your appointments. 

## 2022-05-28 NOTE — Progress Notes (Signed)
Hematology and Oncology Follow Up Visit  Riley Eaton [id number] 26-Jan-1941 81 y.o. 05/28/2022   Principle Diagnosis:  IgG Kappa myeloma  -- +1q and t(11:14)  Current Therapy:   Velcade/Decadron -- ( 3 wk on/1 wk off) -- s/p cycle #7 -- start on 08/24/2021 Faspro - s/p cycle #1 -  started on 04/30/2022     Interim History:  Riley Eaton is back for follow-up.  [name] is deftly working.  His Kappa light chain is now down to 370 mg/L.  The M spike is still 0.4 g/dL.  His IgG level is 1000 mg/dL.  He is feeling well.  He has had no real complaints.  He has had no nausea or vomiting.  He has had no change in bowel or bladder habits.  There is been no diarrhea or constipation.  His blood pressure is on the low side.  I told him to stop the losartan that he is on.  He is on Lasix and Coreg.  He does have a chronic back issues.  This is I think from scoliosis.  He has had no cough.  He has had no rashes.  There is been no bleeding.  He has had no fever.  Overall, I would say his performance status is probably ECOG 1.     Medications:  Current Outpatient Medications:    acetaminophen (TYLENOL) 325 MG tablet, Take 2 tablets (650 mg total) by mouth every 6 (six) hours as needed for mild pain (or temp > 100)., Disp: 20 tablet, Rfl: 0   allopurinol (ZYLOPRIM) 100 MG tablet, TAKE 1 TABLET BY MOUTH EVERY DAY (Patient taking differently: Take 100 mg by mouth daily.), Disp: 90 tablet, Rfl: 1   aspirin 81 MG chewable tablet, Chew 1 tablet (81 mg total) by mouth daily., Disp: 30 tablet, Rfl: 0   atorvastatin (LIPITOR) 40 MG tablet, Take 1 tablet (40 mg total) by mouth daily at 6 PM. (Patient taking differently: Take 40 mg by mouth at bedtime.), Disp: 30 tablet, Rfl: 0   carvedilol (COREG) 6.25 MG tablet, Take 1 tablet by mouth 2 (two) times daily., Disp: , Rfl:    colchicine 0.6 MG tablet, , Disp: , Rfl:    diphenhydramine-acetaminophen (TYLENOL PM) 25-500 MG TABS tablet, Take 1 tablet by  mouth at bedtime., Disp: , Rfl:    ferrous sulfate 325 (65 FE) MG EC tablet, Take 325 mg by mouth daily., Disp: , Rfl:    finasteride (PROSCAR) 5 MG tablet, Take 1 tablet (5 mg total) by mouth daily. For urination, Disp: 90 tablet, Rfl: 3   furosemide (LASIX) 40 MG tablet, TAKE 1 TABLET BY MOUTH EVERY DAY, Disp: 90 tablet, Rfl: 3   HYDROcodone-acetaminophen (NORCO/VICODIN) 5-325 MG tablet, Take 1 tablet by mouth 4 (four) times daily., Disp: , Rfl:    losartan (COZAAR) 25 MG tablet, Take 25 mg by mouth daily., Disp: , Rfl:    MOVANTIK 25 MG TABS tablet, Take 25 mg by mouth daily., Disp: , Rfl:    OLANZapine (ZYPREXA) 5 MG tablet, Take 1 tablet (5 mg total) by mouth daily., Disp: 30 tablet, Rfl: 0   ondansetron (ZOFRAN ODT) 4 MG disintegrating tablet, Take 1 tablet (4 mg total) by mouth every 8 (eight) hours as needed for nausea or vomiting., Disp: 20 tablet, Rfl: 0   pantoprazole (PROTONIX) 40 MG tablet, Take 1 tablet (40 mg total) by mouth at bedtime. (Patient taking differently: Take 40 mg by mouth daily.), Disp: 30 tablet, Rfl: 0  polyethylene glycol (MIRALAX / GLYCOLAX) 17 g packet, Take 17 g by mouth daily. (Patient taking differently: Take 17 g by mouth daily as needed (constipation).), Disp: 14 each, Rfl: 0   pregabalin (LYRICA) 150 MG capsule, Take 150 mg by mouth 2 (two) times daily., Disp: , Rfl:    traZODone (DESYREL) 50 MG tablet, Take 50 mg by mouth at bedtime., Disp: , Rfl:    vitamin B-12 (CYANOCOBALAMIN) 500 MCG tablet, Take 500 mcg by mouth daily., Disp: , Rfl:   Allergies: No Known Allergies  Past Medical History, Surgical history, Social history, and Family History were reviewed and updated.  Review of Systems: Review of Systems  Constitutional: Negative.   HENT:  Negative.    Eyes: Negative.   Respiratory: Negative.    Cardiovascular: Negative.   Gastrointestinal: Negative.   Endocrine: Negative.   Genitourinary: Negative.    Musculoskeletal: Negative.   Skin:  Negative.   Neurological: Negative.   Hematological: Negative.   Psychiatric/Behavioral: Negative.      Physical Exam:  height is '5\' 7"'$  (1.702 m) and weight is 190 lb 12.8 oz (86.5 kg). His oral temperature is 98.2 F (36.8 C). His blood pressure is 88/58 (abnormal) and his pulse is 57 (abnormal). His respiration is 19 and oxygen saturation is 96%.   Wt Readings from Last 3 Encounters:  05/28/22 190 lb 12.8 oz (86.5 kg)  04/30/22 191 lb 4 oz (86.8 kg)  04/10/22 192 lb 1.9 oz (87.1 kg)    Physical Exam Vitals reviewed.  HENT:     Head: Normocephalic and atraumatic.  Eyes:     Pupils: Pupils are equal, round, and reactive to light.  Cardiovascular:     Rate and Rhythm: Normal rate and regular rhythm.     Heart sounds: Normal heart sounds.  Pulmonary:     Effort: Pulmonary effort is normal.     Breath sounds: Normal breath sounds.  Abdominal:     General: Bowel sounds are normal.     Palpations: Abdomen is soft.  Musculoskeletal:        General: No tenderness or deformity. Normal range of motion.     Cervical back: Normal range of motion.  Lymphadenopathy:     Cervical: No cervical adenopathy.  Skin:    General: Skin is warm and dry.     Findings: No erythema or rash.  Neurological:     Mental Status: He is alert and oriented to person, place, and time.  Psychiatric:        Behavior: Behavior normal.        Thought Content: Thought content normal.        Judgment: Judgment normal.    Lab Results  Component Value Date   WBC 3.9 (L) 05/28/2022   HGB 12.2 (L) 05/28/2022   HCT 34.3 (L) 05/28/2022   MCV 97.7 05/28/2022   PLT 67 (L) 05/28/2022     Chemistry      Component Value Date/Time   NA 140 05/28/2022 1007   NA 142 10/15/2019 1500   K 4.0 05/28/2022 1007   CL 103 05/28/2022 1007   CO2 27 05/28/2022 1007   BUN 22 05/28/2022 1007   BUN 25 10/15/2019 1500   CREATININE 1.23 05/28/2022 1007      Component Value Date/Time   CALCIUM 9.0 05/28/2022 1007    ALKPHOS 85 05/28/2022 1007   AST 22 05/28/2022 1007   ALT 16 05/28/2022 1007   BILITOT 0.8 05/28/2022 1007  Impression and Plan: Riley Eaton is a very nice 81 year old white male.  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  Again, I think [name] is making a difference.  We will see what the light chain level is today.  I am glad that his quality life is doing well.  I want to be able to enjoy the holidays that are coming up.   [name], MD 10/17/202311:47 AM

## 2022-05-28 NOTE — Progress Notes (Signed)
Ok to treat with platelets of 67 per Dr Marin Olp. dph

## 2022-05-29 ENCOUNTER — Other Ambulatory Visit: Payer: Self-pay

## 2022-05-29 LAB — KAPPA/LAMBDA LIGHT CHAINS
Kappa free light chain: 62.4 mg/L — ABNORMAL HIGH (ref 3.3–19.4)
Kappa, lambda light chain ratio: 3.53 — ABNORMAL HIGH (ref 0.26–1.65)
Lambda free light chains: 17.7 mg/L (ref 5.7–26.3)

## 2022-05-29 LAB — IGG, IGA, IGM
IgA: 53 mg/dL — ABNORMAL LOW (ref 61–437)
IgG (Immunoglobin G), Serum: 746 mg/dL (ref 603–1613)
IgM (Immunoglobulin M), Srm: 29 mg/dL (ref 15–143)

## 2022-05-30 ENCOUNTER — Telehealth: Payer: Self-pay

## 2022-05-30 ENCOUNTER — Other Ambulatory Visit: Payer: Self-pay

## 2022-05-30 NOTE — Telephone Encounter (Signed)
-----   Message from [name], MD sent at 05/30/2022  6:07 AM EDT ----- Call - the myeloma level went from 370 down to 62!!!   This is great news!!  Laurey Arrow

## 2022-05-30 NOTE — Telephone Encounter (Signed)
Attempted to call patient with no answer. Will attempt to call patient back at later time.

## 2022-05-30 NOTE — Telephone Encounter (Signed)
Per Dr. Antonieta Pert request patient called and informed that the myeloma level went from 370 down to 62. Pt informed that this is fabulous news!!  Pt appreciative of call and had no further questions.

## 2022-05-31 LAB — PROTEIN ELECTROPHORESIS, SERUM, WITH REFLEX
A/G Ratio: 1.6 (ref 0.7–1.7)
Albumin ELP: 3.6 g/dL (ref 2.9–4.4)
Alpha-1-Globulin: 0.3 g/dL (ref 0.0–0.4)
Alpha-2-Globulin: 0.5 g/dL (ref 0.4–1.0)
Beta Globulin: 0.7 g/dL (ref 0.7–1.3)
Gamma Globulin: 0.7 g/dL (ref 0.4–1.8)
Globulin, Total: 2.2 g/dL (ref 2.2–3.9)
M-Spike, %: 0.3 g/dL — ABNORMAL HIGH
SPEP Interpretation: 0
Total Protein ELP: 5.8 g/dL — ABNORMAL LOW (ref 6.0–8.5)

## 2022-05-31 LAB — IMMUNOFIXATION REFLEX, SERUM
IgA: 54 mg/dL — ABNORMAL LOW (ref 61–437)
IgG (Immunoglobin G), Serum: 761 mg/dL (ref 603–1613)
IgM (Immunoglobulin M), Srm: 26 mg/dL (ref 15–143)

## 2022-06-04 ENCOUNTER — Inpatient Hospital Stay: Payer: Medicare PPO

## 2022-06-04 VITALS — BP 98/62 | HR 59 | Temp 98.0°F | Resp 16

## 2022-06-04 DIAGNOSIS — D472 Monoclonal gammopathy: Secondary | ICD-10-CM

## 2022-06-04 DIAGNOSIS — Z5111 Encounter for antineoplastic chemotherapy: Secondary | ICD-10-CM | POA: Diagnosis not present

## 2022-06-04 LAB — CBC WITH DIFFERENTIAL (CANCER CENTER ONLY)
Abs Immature Granulocytes: 0.02 10*3/uL (ref 0.00–0.07)
Basophils Absolute: 0 10*3/uL (ref 0.0–0.1)
Basophils Relative: 0 %
Eosinophils Absolute: 0.2 10*3/uL (ref 0.0–0.5)
Eosinophils Relative: 5 %
HCT: 34.2 % — ABNORMAL LOW (ref 39.0–52.0)
Hemoglobin: 12.3 g/dL — ABNORMAL LOW (ref 13.0–17.0)
Immature Granulocytes: 1 %
Lymphocytes Relative: 21 %
Lymphs Abs: 0.9 10*3/uL (ref 0.7–4.0)
MCH: 35 pg — ABNORMAL HIGH (ref 26.0–34.0)
MCHC: 36 g/dL (ref 30.0–36.0)
MCV: 97.4 fL (ref 80.0–100.0)
Monocytes Absolute: 0.4 10*3/uL (ref 0.1–1.0)
Monocytes Relative: 10 %
Neutro Abs: 2.5 10*3/uL (ref 1.7–7.7)
Neutrophils Relative %: 63 %
Platelet Count: 78 10*3/uL — ABNORMAL LOW (ref 150–400)
RBC: 3.51 MIL/uL — ABNORMAL LOW (ref 4.22–5.81)
RDW: 12.8 % (ref 11.5–15.5)
WBC Count: 4 10*3/uL (ref 4.0–10.5)
nRBC: 0 % (ref 0.0–0.2)

## 2022-06-04 LAB — CMP (CANCER CENTER ONLY)
ALT: 14 U/L (ref 0–44)
AST: 20 U/L (ref 15–41)
Albumin: 4.2 g/dL (ref 3.5–5.0)
Alkaline Phosphatase: 79 U/L (ref 38–126)
Anion gap: 6 (ref 5–15)
BUN: 22 mg/dL (ref 8–23)
CO2: 30 mmol/L (ref 22–32)
Calcium: 9.2 mg/dL (ref 8.9–10.3)
Chloride: 103 mmol/L (ref 98–111)
Creatinine: 1.11 mg/dL (ref 0.61–1.24)
GFR, Estimated: >60 mL/min (ref >60)
Glucose, Bld: 120 mg/dL — ABNORMAL HIGH (ref 70–99)
Potassium: 4.5 mmol/L (ref 3.5–5.1)
Sodium: 139 mmol/L (ref 135–145)
Total Bilirubin: 0.9 mg/dL (ref 0.3–1.2)
Total Protein: 6 g/dL — ABNORMAL LOW (ref 6.5–8.1)

## 2022-06-04 MED ORDER — DEXAMETHASONE 4 MG PO TABS
20.0000 mg | ORAL_TABLET | Freq: Once | ORAL | Status: AC
  Administered 2022-06-04: 20 mg via ORAL
  Filled 2022-06-04: qty 5

## 2022-06-04 MED ORDER — DIPHENHYDRAMINE HCL 25 MG PO CAPS
25.0000 mg | ORAL_CAPSULE | Freq: Once | ORAL | Status: AC
  Administered 2022-06-04: 25 mg via ORAL
  Filled 2022-06-04: qty 1

## 2022-06-04 MED ORDER — DARATUMUMAB-HYALURONIDASE-FIHJ 1800-30000 MG-UT/15ML ~~LOC~~ SOLN
1800.0000 mg | Freq: Once | SUBCUTANEOUS | Status: AC
  Administered 2022-06-04: 1800 mg via SUBCUTANEOUS
  Filled 2022-06-04: qty 15

## 2022-06-04 MED ORDER — ACETAMINOPHEN 325 MG PO TABS
650.0000 mg | ORAL_TABLET | Freq: Once | ORAL | Status: AC
  Administered 2022-06-04: 650 mg via ORAL
  Filled 2022-06-04: qty 2

## 2022-06-04 MED ORDER — BORTEZOMIB CHEMO SQ INJECTION 3.5 MG (2.5MG/ML)
1.3000 mg/m2 | Freq: Once | INTRAMUSCULAR | Status: AC
  Administered 2022-06-04: 2.75 mg via SUBCUTANEOUS
  Filled 2022-06-04: qty 1.1

## 2022-06-04 NOTE — Progress Notes (Signed)
Ok per Nelwyn Salisbury, PA to treat with platelets of 71. dph

## 2022-06-04 NOTE — Patient Instructions (Signed)
[facility]  Discharge Instructions: Thank you for choosing [facility] to provide your oncology and hematology care.   If you have a lab appointment with the [facility], please go directly to the [facility] and check in at the registration area.  Wear comfortable clothing and clothing appropriate for easy access to any Portacath or PICC line.   We strive to give you quality time with your provider. You may need to reschedule your appointment if you arrive late (15 or more minutes).  Arriving late affects you and other patients whose appointments are after yours.  Also, if you miss three or more appointments without notifying the office, you may be dismissed from the clinic at the provider's discretion.      For prescription refill requests, have your pharmacy contact our office and allow 72 hours for refills to be completed.    Today you received the following chemotherapy and/or immunotherapy agents velcade, faspro      To help prevent nausea and vomiting after your treatment, we encourage you to take your nausea medication as directed.  BELOW ARE SYMPTOMS THAT SHOULD BE REPORTED IMMEDIATELY: *FEVER GREATER THAN 100.4 F (38 C) OR HIGHER *CHILLS OR SWEATING *NAUSEA AND VOMITING THAT IS NOT CONTROLLED WITH YOUR NAUSEA MEDICATION *UNUSUAL SHORTNESS OF BREATH *UNUSUAL BRUISING OR BLEEDING *URINARY PROBLEMS (pain or burning when urinating, or frequent urination) *BOWEL PROBLEMS (unusual diarrhea, constipation, pain near the anus) TENDERNESS IN MOUTH AND THROAT WITH OR WITHOUT PRESENCE OF ULCERS (sore throat, sores in mouth, or a toothache) UNUSUAL RASH, SWELLING OR PAIN  UNUSUAL VAGINAL DISCHARGE OR ITCHING   Items with * indicate a potential emergency and should be followed up as soon as possible or go to the Emergency Department if any problems should occur.  Please show the CHEMOTHERAPY ALERT CARD or IMMUNOTHERAPY ALERT CARD at check-in to  the Emergency Department and triage nurse. Should you have questions after your visit or need to cancel or reschedule your appointment, please contact [facility]  [phone] and follow the prompts.  Office hours are 8:00 a.m. to 4:30 p.m. Monday - Friday. Please note that voicemails left after 4:00 p.m. may not be returned until the following business day.  We are closed weekends and major holidays. You have access to a nurse at all times for urgent questions. Please call the main number to the clinic [phone] and follow the prompts.  For any non-urgent questions, you may also contact your provider using MyChart. We now offer e-Visits for anyone 69 and older to request care online for non-urgent symptoms. For details visit [email or web address].   Also download the MyChart app! Go to the app store, search "MyChart", open the app, select [facility], and log in with your MyChart username and password.  Masks are optional in the cancer centers. If you would like for your care team to wear a mask while they are taking care of you, please let them know. You may have one support person who is at least 82 years old accompany you for your appointments.

## 2022-06-11 ENCOUNTER — Inpatient Hospital Stay: Payer: Medicare PPO

## 2022-06-11 VITALS — BP 99/56 | HR 50 | Temp 97.6°F | Resp 17

## 2022-06-11 DIAGNOSIS — D472 Monoclonal gammopathy: Secondary | ICD-10-CM

## 2022-06-11 DIAGNOSIS — Z5111 Encounter for antineoplastic chemotherapy: Secondary | ICD-10-CM | POA: Diagnosis not present

## 2022-06-11 LAB — CBC WITH DIFFERENTIAL (CANCER CENTER ONLY)
Abs Immature Granulocytes: 0.03 10*3/uL (ref 0.00–0.07)
Basophils Absolute: 0 10*3/uL (ref 0.0–0.1)
Basophils Relative: 0 %
Eosinophils Absolute: 0.2 10*3/uL (ref 0.0–0.5)
Eosinophils Relative: 4 %
HCT: 34.8 % — ABNORMAL LOW (ref 39.0–52.0)
Hemoglobin: 12.1 g/dL — ABNORMAL LOW (ref 13.0–17.0)
Immature Granulocytes: 1 %
Lymphocytes Relative: 21 %
Lymphs Abs: 1.1 10*3/uL (ref 0.7–4.0)
MCH: 34.9 pg — ABNORMAL HIGH (ref 26.0–34.0)
MCHC: 34.8 g/dL (ref 30.0–36.0)
MCV: 100.3 fL — ABNORMAL HIGH (ref 80.0–100.0)
Monocytes Absolute: 0.5 10*3/uL (ref 0.1–1.0)
Monocytes Relative: 10 %
Neutro Abs: 3.2 10*3/uL (ref 1.7–7.7)
Neutrophils Relative %: 64 %
Platelet Count: 78 10*3/uL — ABNORMAL LOW (ref 150–400)
RBC: 3.47 MIL/uL — ABNORMAL LOW (ref 4.22–5.81)
RDW: 12.9 % (ref 11.5–15.5)
WBC Count: 5 10*3/uL (ref 4.0–10.5)
nRBC: 0 % (ref 0.0–0.2)

## 2022-06-11 MED ORDER — DIPHENHYDRAMINE HCL 25 MG PO CAPS
25.0000 mg | ORAL_CAPSULE | Freq: Once | ORAL | Status: AC
  Administered 2022-06-11: 25 mg via ORAL
  Filled 2022-06-11: qty 1

## 2022-06-11 MED ORDER — ACETAMINOPHEN 325 MG PO TABS
650.0000 mg | ORAL_TABLET | Freq: Once | ORAL | Status: AC
  Administered 2022-06-11: 650 mg via ORAL
  Filled 2022-06-11: qty 2

## 2022-06-11 MED ORDER — DEXAMETHASONE 4 MG PO TABS
20.0000 mg | ORAL_TABLET | Freq: Once | ORAL | Status: AC
  Administered 2022-06-11: 20 mg via ORAL
  Filled 2022-06-11: qty 5

## 2022-06-11 MED ORDER — DARATUMUMAB-HYALURONIDASE-FIHJ 1800-30000 MG-UT/15ML ~~LOC~~ SOLN
1800.0000 mg | Freq: Once | SUBCUTANEOUS | Status: AC
  Administered 2022-06-11: 1800 mg via SUBCUTANEOUS
  Filled 2022-06-11: qty 15

## 2022-06-11 NOTE — Progress Notes (Signed)
Per Dr. Marin Olp, okay to treat despite labs

## 2022-06-11 NOTE — Patient Instructions (Signed)
[facility]  Discharge Instructions: Thank you for choosing [facility] to provide your oncology and hematology care.   If you have a lab appointment with the [facility], please go directly to the [facility] and check in at the registration area.  Wear comfortable clothing and clothing appropriate for easy access to any Portacath or PICC line.   We strive to give you quality time with your provider. You may need to reschedule your appointment if you arrive late (15 or more minutes).  Arriving late affects you and other patients whose appointments are after yours.  Also, if you miss three or more appointments without notifying the office, you may be dismissed from the clinic at the provider's discretion.      For prescription refill requests, have your pharmacy contact our office and allow 72 hours for refills to be completed.    Today you received the following chemotherapy and/or immunotherapy agents Faspro     To help prevent nausea and vomiting after your treatment, we encourage you to take your nausea medication as directed.  BELOW ARE SYMPTOMS THAT SHOULD BE REPORTED IMMEDIATELY: *FEVER GREATER THAN 100.4 F (38 C) OR HIGHER *CHILLS OR SWEATING *NAUSEA AND VOMITING THAT IS NOT CONTROLLED WITH YOUR NAUSEA MEDICATION *UNUSUAL SHORTNESS OF BREATH *UNUSUAL BRUISING OR BLEEDING *URINARY PROBLEMS (pain or burning when urinating, or frequent urination) *BOWEL PROBLEMS (unusual diarrhea, constipation, pain near the anus) TENDERNESS IN MOUTH AND THROAT WITH OR WITHOUT PRESENCE OF ULCERS (sore throat, sores in mouth, or a toothache) UNUSUAL RASH, SWELLING OR PAIN  UNUSUAL VAGINAL DISCHARGE OR ITCHING   Items with * indicate a potential emergency and should be followed up as soon as possible or go to the Emergency Department if any problems should occur.  Please show the CHEMOTHERAPY ALERT CARD or IMMUNOTHERAPY ALERT CARD at check-in to the  Emergency Department and triage nurse. Should you have questions after your visit or need to cancel or reschedule your appointment, please contact [facility]  [phone] and follow the prompts.  Office hours are 8:00 a.m. to 4:30 p.m. Monday - Friday. Please note that voicemails left after 4:00 p.m. may not be returned until the following business day.  We are closed weekends and major holidays. You have access to a nurse at all times for urgent questions. Please call the main number to the clinic [phone] and follow the prompts.  For any non-urgent questions, you may also contact your provider using MyChart. We now offer e-Visits for anyone 72 and older to request care online for non-urgent symptoms. For details visit [email or web address].   Also download the MyChart app! Go to the app store, search "MyChart", open the app, select [facility], and log in with your MyChart username and password.  Masks are optional in the cancer centers. If you would like for your care team to wear a mask while they are taking care of you, please let them know. You may have one support person who is at least 82 years old accompany you for your appointments.

## 2022-06-14 ENCOUNTER — Other Ambulatory Visit: Payer: Self-pay | Admitting: Cardiovascular Disease

## 2022-06-18 ENCOUNTER — Other Ambulatory Visit: Payer: Self-pay

## 2022-06-25 ENCOUNTER — Inpatient Hospital Stay: Payer: Medicare PPO

## 2022-06-25 ENCOUNTER — Other Ambulatory Visit: Payer: Self-pay

## 2022-06-25 ENCOUNTER — Inpatient Hospital Stay: Payer: Medicare PPO | Attending: Hematology & Oncology

## 2022-06-25 ENCOUNTER — Inpatient Hospital Stay: Payer: Medicare PPO | Admitting: Hematology & Oncology

## 2022-06-25 ENCOUNTER — Encounter: Payer: Self-pay | Admitting: Hematology & Oncology

## 2022-06-25 VITALS — BP 115/75 | HR 63 | Temp 97.5°F | Resp 20 | Wt 193.0 lb

## 2022-06-25 VITALS — BP 132/85 | HR 56 | Resp 19

## 2022-06-25 DIAGNOSIS — C9000 Multiple myeloma not having achieved remission: Secondary | ICD-10-CM | POA: Diagnosis not present

## 2022-06-25 DIAGNOSIS — Z79899 Other long term (current) drug therapy: Secondary | ICD-10-CM | POA: Diagnosis not present

## 2022-06-25 DIAGNOSIS — Z5112 Encounter for antineoplastic immunotherapy: Secondary | ICD-10-CM | POA: Diagnosis not present

## 2022-06-25 DIAGNOSIS — Z5111 Encounter for antineoplastic chemotherapy: Secondary | ICD-10-CM | POA: Insufficient documentation

## 2022-06-25 DIAGNOSIS — D472 Monoclonal gammopathy: Secondary | ICD-10-CM

## 2022-06-25 LAB — CMP (CANCER CENTER ONLY)
ALT: 21 U/L (ref 0–44)
AST: 27 U/L (ref 15–41)
Albumin: 4.1 g/dL (ref 3.5–5.0)
Alkaline Phosphatase: 93 U/L (ref 38–126)
Anion gap: 7 (ref 5–15)
BUN: 20 mg/dL (ref 8–23)
CO2: 31 mmol/L (ref 22–32)
Calcium: 9.1 mg/dL (ref 8.9–10.3)
Chloride: 102 mmol/L (ref 98–111)
Creatinine: 1.18 mg/dL (ref 0.61–1.24)
GFR, Estimated: >60 mL/min (ref >60)
Glucose, Bld: 166 mg/dL — ABNORMAL HIGH (ref 70–99)
Potassium: 4 mmol/L (ref 3.5–5.1)
Sodium: 140 mmol/L (ref 135–145)
Total Bilirubin: 0.8 mg/dL (ref 0.3–1.2)
Total Protein: 6.3 g/dL — ABNORMAL LOW (ref 6.5–8.1)

## 2022-06-25 LAB — CBC WITH DIFFERENTIAL (CANCER CENTER ONLY)
Abs Immature Granulocytes: 0.04 10*3/uL (ref 0.00–0.07)
Basophils Absolute: 0 10*3/uL (ref 0.0–0.1)
Basophils Relative: 0 %
Eosinophils Absolute: 0.2 10*3/uL (ref 0.0–0.5)
Eosinophils Relative: 5 %
HCT: 34.5 % — ABNORMAL LOW (ref 39.0–52.0)
Hemoglobin: 12.3 g/dL — ABNORMAL LOW (ref 13.0–17.0)
Immature Granulocytes: 1 %
Lymphocytes Relative: 19 %
Lymphs Abs: 0.8 10*3/uL (ref 0.7–4.0)
MCH: 35.1 pg — ABNORMAL HIGH (ref 26.0–34.0)
MCHC: 35.7 g/dL (ref 30.0–36.0)
MCV: 98.6 fL (ref 80.0–100.0)
Monocytes Absolute: 0.4 10*3/uL (ref 0.1–1.0)
Monocytes Relative: 8 %
Neutro Abs: 2.9 10*3/uL (ref 1.7–7.7)
Neutrophils Relative %: 67 %
Platelet Count: 88 10*3/uL — ABNORMAL LOW (ref 150–400)
RBC: 3.5 MIL/uL — ABNORMAL LOW (ref 4.22–5.81)
RDW: 12.7 % (ref 11.5–15.5)
WBC Count: 4.3 10*3/uL (ref 4.0–10.5)
nRBC: 0 % (ref 0.0–0.2)

## 2022-06-25 LAB — LACTATE DEHYDROGENASE: LDH: 313 U/L — ABNORMAL HIGH (ref 98–192)

## 2022-06-25 MED ORDER — BORTEZOMIB CHEMO SQ INJECTION 3.5 MG (2.5MG/ML)
1.3000 mg/m2 | Freq: Once | INTRAMUSCULAR | Status: AC
  Administered 2022-06-25: 2.75 mg via SUBCUTANEOUS
  Filled 2022-06-25: qty 1.1

## 2022-06-25 MED ORDER — DARATUMUMAB-HYALURONIDASE-FIHJ 1800-30000 MG-UT/15ML ~~LOC~~ SOLN
1800.0000 mg | Freq: Once | SUBCUTANEOUS | Status: AC
  Administered 2022-06-25: 1800 mg via SUBCUTANEOUS
  Filled 2022-06-25: qty 15

## 2022-06-25 MED ORDER — ACETAMINOPHEN 325 MG PO TABS
650.0000 mg | ORAL_TABLET | Freq: Once | ORAL | Status: AC
  Administered 2022-06-25: 650 mg via ORAL
  Filled 2022-06-25: qty 2

## 2022-06-25 MED ORDER — DIPHENHYDRAMINE HCL 25 MG PO CAPS
25.0000 mg | ORAL_CAPSULE | Freq: Once | ORAL | Status: AC
  Administered 2022-06-25: 25 mg via ORAL
  Filled 2022-06-25: qty 1

## 2022-06-25 MED ORDER — DEXAMETHASONE 4 MG PO TABS
20.0000 mg | ORAL_TABLET | Freq: Once | ORAL | Status: AC
  Administered 2022-06-25: 20 mg via ORAL
  Filled 2022-06-25: qty 5

## 2022-06-25 NOTE — Progress Notes (Signed)
Ok to treat with platelets 88 per Dr. Marin Olp

## 2022-06-25 NOTE — Patient Instructions (Signed)
[facility]  Discharge Instructions: Thank you for choosing [facility] to provide your oncology and hematology care.   If you have a lab appointment with the [facility], please go directly to the [facility] and check in at the registration area.  Wear comfortable clothing and clothing appropriate for easy access to any Portacath or PICC line.   We strive to give you quality time with your provider. You may need to reschedule your appointment if you arrive late (15 or more minutes).  Arriving late affects you and other patients whose appointments are after yours.  Also, if you miss three or more appointments without notifying the office, you may be dismissed from the clinic at the provider's discretion.      For prescription refill requests, have your pharmacy contact our office and allow 72 hours for refills to be completed.    Today you received the following chemotherapy and/or immunotherapy agents Velcade/ Faspro      To help prevent nausea and vomiting after your treatment, we encourage you to take your nausea medication as directed.  BELOW ARE SYMPTOMS THAT SHOULD BE REPORTED IMMEDIATELY: *FEVER GREATER THAN 100.4 F (38 C) OR HIGHER *CHILLS OR SWEATING *NAUSEA AND VOMITING THAT IS NOT CONTROLLED WITH YOUR NAUSEA MEDICATION *UNUSUAL SHORTNESS OF BREATH *UNUSUAL BRUISING OR BLEEDING *URINARY PROBLEMS (pain or burning when urinating, or frequent urination) *BOWEL PROBLEMS (unusual diarrhea, constipation, pain near the anus) TENDERNESS IN MOUTH AND THROAT WITH OR WITHOUT PRESENCE OF ULCERS (sore throat, sores in mouth, or a toothache) UNUSUAL RASH, SWELLING OR PAIN  UNUSUAL VAGINAL DISCHARGE OR ITCHING   Items with * indicate a potential emergency and should be followed up as soon as possible or go to the Emergency Department if any problems should occur.  Please show the CHEMOTHERAPY ALERT CARD or IMMUNOTHERAPY ALERT CARD at check-in to  the Emergency Department and triage nurse. Should you have questions after your visit or need to cancel or reschedule your appointment, please contact [facility]  [phone] and follow the prompts.  Office hours are 8:00 a.m. to 4:30 p.m. Monday - Friday. Please note that voicemails left after 4:00 p.m. may not be returned until the following business day.  We are closed weekends and major holidays. You have access to a nurse at all times for urgent questions. Please call the main number to the clinic [phone] and follow the prompts.  For any non-urgent questions, you may also contact your provider using MyChart. We now offer e-Visits for anyone 75 and older to request care online for non-urgent symptoms. For details visit [email or web address].   Also download the MyChart app! Go to the app store, search "MyChart", open the app, select [facility], and log in with your MyChart username and password.  Masks are optional in the cancer centers. If you would like for your care team to wear a mask while they are taking care of you, please let them know. You may have one support person who is at least 82 years old accompany you for your appointments.

## 2022-06-25 NOTE — Progress Notes (Signed)
Hematology and Oncology Follow Up Visit  Riley Eaton [id number] 1940/09/21 81 y.o. 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 Riley Eaton is back for follow-up.  Riley Riley Eaton is deftly working incredibly well.  Riley Eaton light chain is now down to 62 mg/L.  Riley M spike is 0.3 g/dL.  Riley IgG level is.  Riley Eaton light chain is now down to 370 mg/L.  Riley M spike is still 0.4 g/dL.  Riley IgG level is 750 mg/dL.  He has done well.  He has tolerated treatment well.  He has had no nausea or vomiting.  He has had no cough or shortness of breath.  He has had no change in bowel or bladder habits.  There has been no leg swelling.  He has had no headache.  I am just glad that Riley quality life has done so well.  We have adjusted some of Riley blood pressure medications.  Now, he is only on Lasix and Coreg.  Riley blood pressure is still doing quite nicely.  He is got have a very nice Thanksgiving.  He will be with Riley family.  Overall, I would say Riley performance status is probably ECOG 1.   Medications:  Current Outpatient Medications:    losartan (COZAAR) 25 MG tablet, Take 25 mg by mouth daily., Disp: , Rfl:    acetaminophen (TYLENOL) 325 MG tablet, Take 2 tablets (650 mg total) by mouth every 6 (six) hours as needed for mild pain (or temp > 100)., Disp: 20 tablet, Rfl: 0   allopurinol (ZYLOPRIM) 100 MG tablet, TAKE 1 TABLET BY MOUTH EVERY DAY (Patient taking differently: Take 100 mg by mouth daily.), Disp: 90 tablet, Rfl: 1   aspirin 81 MG chewable tablet, Chew 1 tablet (81 mg total) by mouth daily., Disp: 30 tablet, Rfl: 0   atorvastatin (LIPITOR) 40 MG tablet, Take 1 tablet (40 mg total) by mouth daily at 6 PM. (Patient taking differently: Take 40 mg by mouth at bedtime.), Disp: 30 tablet, Rfl:  0   carvedilol (COREG) 6.25 MG tablet, Take 1 tablet by mouth 2 (two) times daily., Disp: , Rfl:    colchicine 0.6 MG tablet, , Disp: , Rfl:    diphenhydramine-acetaminophen (TYLENOL PM) 25-500 MG TABS tablet, Take 1 tablet by mouth at bedtime., Disp: , Rfl:    ferrous sulfate 325 (65 FE) MG EC tablet, Take 325 mg by mouth daily., Disp: , Rfl:    finasteride (PROSCAR) 5 MG tablet, Take 1 tablet (5 mg total) by mouth daily. For urination, Disp: 90 tablet, Rfl: 3   furosemide (LASIX) 40 MG tablet, TAKE 1 TABLET BY MOUTH EVERY DAY, Disp: 90 tablet, Rfl: 3   HYDROcodone-acetaminophen (NORCO/VICODIN) 5-325 MG tablet, Take 1 tablet by mouth 4 (four) times daily., Disp: , Rfl:    MOVANTIK 25 MG TABS tablet, Take 25 mg by mouth daily., Disp: , Rfl:    OLANZapine (ZYPREXA) 5 MG tablet, Take 1 tablet (5 mg total) by mouth daily., Disp: 30 tablet, Rfl: 0   ondansetron (ZOFRAN ODT) 4 MG disintegrating tablet, Take 1 tablet (4 mg total) by mouth every 8 (eight) hours as needed for nausea or vomiting., Disp: 20 tablet, Rfl: 0   pantoprazole (PROTONIX)  40 MG tablet, Take 1 tablet (40 mg total) by mouth at bedtime. (Patient taking differently: Take 40 mg by mouth daily.), Disp: 30 tablet, Rfl: 0   polyethylene glycol (MIRALAX / GLYCOLAX) 17 g packet, Take 17 g by mouth daily. (Patient taking differently: Take 17 g by mouth daily as needed (constipation).), Disp: 14 each, Rfl: 0   pregabalin (LYRICA) 150 MG capsule, Take 150 mg by mouth 2 (two) times daily., Disp: , Rfl:    traZODone (DESYREL) 50 MG tablet, Take 50 mg by mouth at bedtime., Disp: , Rfl:    vitamin B-12 (CYANOCOBALAMIN) 500 MCG tablet, Take 500 mcg by mouth daily., Disp: , Rfl:   Allergies: No Known Allergies  Past Medical History, Surgical history, Social history, and Family History were reviewed and updated.  Review of Systems: Review of Systems  Constitutional: Negative.   HENT:  Negative.    Eyes: Negative.   Respiratory: Negative.     Cardiovascular: Negative.   Gastrointestinal: Negative.   Endocrine: Negative.   Genitourinary: Negative.    Musculoskeletal: Negative.   Skin: Negative.   Neurological: Negative.   Hematological: Negative.   Psychiatric/Behavioral: Negative.      Physical Exam:  weight is 193 lb (87.5 kg). Riley oral temperature is 97.5 F (36.4 C) (abnormal). Riley blood pressure is 115/75 and Riley pulse is 63. Riley respiration is 20 and oxygen saturation is 96%.   Wt Readings from Last 3 Encounters:  06/25/22 193 lb (87.5 kg)  05/28/22 190 lb 12.8 oz (86.5 kg)  04/30/22 191 lb 4 oz (86.8 kg)    Physical Exam Vitals reviewed.  HENT:     Head: Normocephalic and atraumatic.  Eyes:     Pupils: Pupils are equal, round, and reactive to light.  Cardiovascular:     Rate and Rhythm: Normal rate and regular rhythm.     Heart sounds: Normal heart sounds.  Pulmonary:     Effort: Pulmonary effort is normal.     Breath sounds: Normal breath sounds.  Abdominal:     General: Bowel sounds are normal.     Palpations: Abdomen is soft.  Musculoskeletal:        General: No tenderness or deformity. Normal range of motion.     Cervical back: Normal range of motion.  Lymphadenopathy:     Cervical: No cervical adenopathy.  Skin:    General: Skin is warm and dry.     Findings: No erythema or rash.  Neurological:     Mental Status: He is alert and oriented to person, place, and time.  Psychiatric:        Behavior: Behavior normal.        Thought Content: Thought content normal.        Judgment: Judgment normal.    Lab Results  Component Value Date   WBC 4.3 06/25/2022   HGB 12.3 (L) 06/25/2022   HCT 34.5 (L) 06/25/2022   MCV 98.6 06/25/2022   PLT 88 (L) 06/25/2022     Chemistry      Component Value Date/Time   NA 140 06/25/2022 0959   NA 142 10/15/2019 1500   K 4.0 06/25/2022 0959   CL 102 06/25/2022 0959   CO2 31 06/25/2022 0959   BUN 20 06/25/2022 0959   BUN 25 10/15/2019 1500   CREATININE  1.18 06/25/2022 0959      Component Value Date/Time   CALCIUM 9.1 06/25/2022 0959   ALKPHOS 93 06/25/2022 0959   AST 27 06/25/2022 0959  ALT 21 06/25/2022 0959   BILITOT 0.8 06/25/2022 0959      Impression and Plan: Mr. Riley Eaton is a very nice 81 year old white male.  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  Riley response has been as expected.  Riley Faspro really has helped quite a bit.  Hopefully, we will be able to pull back on Riley treatments a little bit in Riley future.  He gets Riley Faspro every other week for right now.  I am sure that once we go to once a month, he will continue to improve.  We will go ahead with Riley treatment protocol today.  We will plan to get him back to see [place] in another month.   [name], MD 11/14/202311:39 AM

## 2022-06-26 LAB — KAPPA/LAMBDA LIGHT CHAINS
Kappa free light chain: 47.8 mg/L — ABNORMAL HIGH (ref 3.3–19.4)
Kappa, lambda light chain ratio: 3.12 — ABNORMAL HIGH (ref 0.26–1.65)
Lambda free light chains: 15.3 mg/L (ref 5.7–26.3)

## 2022-06-26 LAB — IGG, IGA, IGM
IgA: 44 mg/dL — ABNORMAL LOW (ref 61–437)
IgG (Immunoglobin G), Serum: 697 mg/dL (ref 603–1613)
IgM (Immunoglobulin M), Srm: 28 mg/dL (ref 15–143)

## 2022-06-27 ENCOUNTER — Telehealth: Payer: Self-pay

## 2022-06-27 NOTE — Telephone Encounter (Signed)
Attempted to call patient with no answer. Unable to leave voicemail. Will attempt at a later time.

## 2022-06-27 NOTE — Telephone Encounter (Signed)
-----   Message from [name], MD sent at 06/26/2022  4:29 PM EST ----- Call and let him know that the myeloma continues to improve.  After Thanksgiving.  Laurey Arrow

## 2022-06-28 ENCOUNTER — Telehealth: Payer: Self-pay | Admitting: *Deleted

## 2022-06-28 NOTE — Telephone Encounter (Signed)
Pt notified per order of Dr. Marin Olp "that the myeloma continues to improve. Happy Thanksgiving.  Pete" Pt is appreciative of call and has no questions or concerns at this time.

## 2022-06-28 NOTE — Telephone Encounter (Signed)
-----   Message from Riley Napoleon, MD sent at 06/26/2022  4:29 PM EST ----- Call and let him know that the myeloma continues to improve.  After Thanksgiving.  Riley Eaton

## 2022-06-29 ENCOUNTER — Other Ambulatory Visit: Payer: Self-pay

## 2022-07-01 ENCOUNTER — Other Ambulatory Visit: Payer: Self-pay

## 2022-07-02 ENCOUNTER — Inpatient Hospital Stay: Payer: Medicare PPO

## 2022-07-02 VITALS — BP 126/67 | HR 72 | Temp 96.0°F | Resp 17

## 2022-07-02 DIAGNOSIS — Z5111 Encounter for antineoplastic chemotherapy: Secondary | ICD-10-CM | POA: Diagnosis not present

## 2022-07-02 DIAGNOSIS — D472 Monoclonal gammopathy: Secondary | ICD-10-CM

## 2022-07-02 LAB — CBC WITH DIFFERENTIAL (CANCER CENTER ONLY)
Abs Immature Granulocytes: 0.03 10*3/uL (ref 0.00–0.07)
Basophils Absolute: 0 10*3/uL (ref 0.0–0.1)
Basophils Relative: 0 %
Eosinophils Absolute: 0.1 10*3/uL (ref 0.0–0.5)
Eosinophils Relative: 3 %
HCT: 35.9 % — ABNORMAL LOW (ref 39.0–52.0)
Hemoglobin: 12.6 g/dL — ABNORMAL LOW (ref 13.0–17.0)
Immature Granulocytes: 1 %
Lymphocytes Relative: 24 %
Lymphs Abs: 1 10*3/uL (ref 0.7–4.0)
MCH: 34.6 pg — ABNORMAL HIGH (ref 26.0–34.0)
MCHC: 35.1 g/dL (ref 30.0–36.0)
MCV: 98.6 fL (ref 80.0–100.0)
Monocytes Absolute: 0.5 10*3/uL (ref 0.1–1.0)
Monocytes Relative: 11 %
Neutro Abs: 2.7 10*3/uL (ref 1.7–7.7)
Neutrophils Relative %: 61 %
Platelet Count: 87 10*3/uL — ABNORMAL LOW (ref 150–400)
RBC: 3.64 MIL/uL — ABNORMAL LOW (ref 4.22–5.81)
RDW: 12.6 % (ref 11.5–15.5)
WBC Count: 4.4 10*3/uL (ref 4.0–10.5)
nRBC: 0 % (ref 0.0–0.2)

## 2022-07-02 LAB — CMP (CANCER CENTER ONLY)
ALT: 21 U/L (ref 0–44)
AST: 23 U/L (ref 15–41)
Albumin: 4.3 g/dL (ref 3.5–5.0)
Alkaline Phosphatase: 84 U/L (ref 38–126)
Anion gap: 5 (ref 5–15)
BUN: 22 mg/dL (ref 8–23)
CO2: 31 mmol/L (ref 22–32)
Calcium: 9.2 mg/dL (ref 8.9–10.3)
Chloride: 103 mmol/L (ref 98–111)
Creatinine: 1.13 mg/dL (ref 0.61–1.24)
GFR, Estimated: >60 mL/min (ref >60)
Glucose, Bld: 153 mg/dL — ABNORMAL HIGH (ref 70–99)
Potassium: 4.4 mmol/L (ref 3.5–5.1)
Sodium: 139 mmol/L (ref 135–145)
Total Bilirubin: 1 mg/dL (ref 0.3–1.2)
Total Protein: 6.2 g/dL — ABNORMAL LOW (ref 6.5–8.1)

## 2022-07-02 MED ORDER — ACETAMINOPHEN 325 MG PO TABS
650.0000 mg | ORAL_TABLET | Freq: Once | ORAL | Status: AC
  Administered 2022-07-02: 650 mg via ORAL
  Filled 2022-07-02: qty 2

## 2022-07-02 MED ORDER — BORTEZOMIB CHEMO SQ INJECTION 3.5 MG (2.5MG/ML)
1.3000 mg/m2 | Freq: Once | INTRAMUSCULAR | Status: AC
  Administered 2022-07-02: 2.75 mg via SUBCUTANEOUS
  Filled 2022-07-02: qty 1.1

## 2022-07-02 MED ORDER — DIPHENHYDRAMINE HCL 25 MG PO CAPS
25.0000 mg | ORAL_CAPSULE | Freq: Once | ORAL | Status: AC
  Administered 2022-07-02: 25 mg via ORAL
  Filled 2022-07-02: qty 1

## 2022-07-02 MED ORDER — DEXAMETHASONE 4 MG PO TABS
20.0000 mg | ORAL_TABLET | Freq: Once | ORAL | Status: AC
  Administered 2022-07-02: 20 mg via ORAL
  Filled 2022-07-02: qty 5

## 2022-07-02 NOTE — Progress Notes (Signed)
Velcade added to day 15 of cycle 3 and cycle 4 per Dr. Antonieta Pert instructions.

## 2022-07-02 NOTE — Progress Notes (Signed)
Per Dr. Ennever, okay to treat today despite labs ?

## 2022-07-02 NOTE — Patient Instructions (Signed)
[facility]  Discharge Instructions: Thank you for choosing [facility] to provide your oncology and hematology care.   If you have a lab appointment with the [facility], please go directly to the [facility] and check in at the registration area.  Wear comfortable clothing and clothing appropriate for easy access to any Portacath or PICC line.   We strive to give you quality time with your provider. You may need to reschedule your appointment if you arrive late (15 or more minutes).  Arriving late affects you and other patients whose appointments are after yours.  Also, if you miss three or more appointments without notifying the office, you may be dismissed from the clinic at the provider's discretion.      For prescription refill requests, have your pharmacy contact our office and allow 72 hours for refills to be completed.    Today you received the following chemotherapy and/or immunotherapy agents velcade     To help prevent nausea and vomiting after your treatment, we encourage you to take your nausea medication as directed.  BELOW ARE SYMPTOMS THAT SHOULD BE REPORTED IMMEDIATELY: *FEVER GREATER THAN 100.4 F (38 C) OR HIGHER *CHILLS OR SWEATING *NAUSEA AND VOMITING THAT IS NOT CONTROLLED WITH YOUR NAUSEA MEDICATION *UNUSUAL SHORTNESS OF BREATH *UNUSUAL BRUISING OR BLEEDING *URINARY PROBLEMS (pain or burning when urinating, or frequent urination) *BOWEL PROBLEMS (unusual diarrhea, constipation, pain near the anus) TENDERNESS IN MOUTH AND THROAT WITH OR WITHOUT PRESENCE OF ULCERS (sore throat, sores in mouth, or a toothache) UNUSUAL RASH, SWELLING OR PAIN  UNUSUAL VAGINAL DISCHARGE OR ITCHING   Items with * indicate a potential emergency and should be followed up as soon as possible or go to the Emergency Department if any problems should occur.  Please show the CHEMOTHERAPY ALERT CARD or IMMUNOTHERAPY ALERT CARD at check-in to the  Emergency Department and triage nurse. Should you have questions after your visit or need to cancel or reschedule your appointment, please contact [facility]  [phone] and follow the prompts.  Office hours are 8:00 a.m. to 4:30 p.m. Monday - Friday. Please note that voicemails left after 4:00 p.m. may not be returned until the following business day.  We are closed weekends and major holidays. You have access to a nurse at all times for urgent questions. Please call the main number to the clinic [phone] and follow the prompts.  For any non-urgent questions, you may also contact your provider using MyChart. We now offer e-Visits for anyone 16 and older to request care online for non-urgent symptoms. For details visit [email or web address].   Also download the MyChart app! Go to the app store, search "MyChart", open the app, select [facility], and log in with your MyChart username and password.  Masks are optional in the cancer centers. If you would like for your care team to wear a mask while they are taking care of you, please let them know. You may have one support person who is at least 82 years old accompany you for your appointments.

## 2022-07-03 ENCOUNTER — Telehealth: Payer: Self-pay | Admitting: *Deleted

## 2022-07-03 LAB — PROTEIN ELECTROPHORESIS, SERUM, WITH REFLEX
A/G Ratio: 1.9 — ABNORMAL HIGH (ref 0.7–1.7)
Albumin ELP: 3.8 g/dL (ref 2.9–4.4)
Alpha-1-Globulin: 0.2 g/dL (ref 0.0–0.4)
Alpha-2-Globulin: 0.5 g/dL (ref 0.4–1.0)
Beta Globulin: 0.7 g/dL (ref 0.7–1.3)
Gamma Globulin: 0.6 g/dL (ref 0.4–1.8)
Globulin, Total: 2 g/dL — ABNORMAL LOW (ref 2.2–3.9)
M-Spike, %: 0.2 g/dL — ABNORMAL HIGH
SPEP Interpretation: 0
Total Protein ELP: 5.8 g/dL — ABNORMAL LOW (ref 6.0–8.5)

## 2022-07-03 LAB — IMMUNOFIXATION REFLEX, SERUM
IgA: 47 mg/dL — ABNORMAL LOW (ref 61–437)
IgG (Immunoglobin G), Serum: 764 mg/dL (ref 603–1613)
IgM (Immunoglobulin M), Srm: 27 mg/dL (ref 15–143)

## 2022-07-03 NOTE — Telephone Encounter (Signed)
As noted below by Dr. Marin Olp, I left a message for the patient informing him that the myeloma is still getting better. The levels keep coming down. Instructed him to call the office if he had any questions or concerns.

## 2022-07-03 NOTE — Telephone Encounter (Signed)
-----   Message from [name], MD sent at 07/03/2022  2:10 PM EST ----- Please call and let him know that the myeloma is still getting better.  Levels keep coming down.  Laurey Arrow

## 2022-07-04 ENCOUNTER — Other Ambulatory Visit: Payer: Self-pay

## 2022-07-09 ENCOUNTER — Inpatient Hospital Stay: Payer: Medicare PPO

## 2022-07-09 VITALS — BP 123/75 | HR 64 | Temp 98.1°F | Resp 18

## 2022-07-09 DIAGNOSIS — Z5111 Encounter for antineoplastic chemotherapy: Secondary | ICD-10-CM | POA: Diagnosis not present

## 2022-07-09 DIAGNOSIS — D472 Monoclonal gammopathy: Secondary | ICD-10-CM

## 2022-07-09 LAB — CBC WITH DIFFERENTIAL (CANCER CENTER ONLY)
Abs Immature Granulocytes: 0.04 10*3/uL (ref 0.00–0.07)
Basophils Absolute: 0 10*3/uL (ref 0.0–0.1)
Basophils Relative: 0 %
Eosinophils Absolute: 0.2 10*3/uL (ref 0.0–0.5)
Eosinophils Relative: 3 %
HCT: 36.8 % — ABNORMAL LOW (ref 39.0–52.0)
Hemoglobin: 12.9 g/dL — ABNORMAL LOW (ref 13.0–17.0)
Immature Granulocytes: 1 %
Lymphocytes Relative: 23 %
Lymphs Abs: 1.2 10*3/uL (ref 0.7–4.0)
MCH: 34.8 pg — ABNORMAL HIGH (ref 26.0–34.0)
MCHC: 35.1 g/dL (ref 30.0–36.0)
MCV: 99.2 fL (ref 80.0–100.0)
Monocytes Absolute: 0.5 10*3/uL (ref 0.1–1.0)
Monocytes Relative: 10 %
Neutro Abs: 3.4 10*3/uL (ref 1.7–7.7)
Neutrophils Relative %: 63 %
Platelet Count: 87 10*3/uL — ABNORMAL LOW (ref 150–400)
RBC: 3.71 MIL/uL — ABNORMAL LOW (ref 4.22–5.81)
RDW: 12.6 % (ref 11.5–15.5)
WBC Count: 5.3 10*3/uL (ref 4.0–10.5)
nRBC: 0 % (ref 0.0–0.2)

## 2022-07-09 LAB — CMP (CANCER CENTER ONLY)
ALT: 56 U/L — ABNORMAL HIGH (ref 0–44)
AST: 62 U/L — ABNORMAL HIGH (ref 15–41)
Albumin: 4.4 g/dL (ref 3.5–5.0)
Alkaline Phosphatase: 94 U/L (ref 38–126)
Anion gap: 6 (ref 5–15)
BUN: 30 mg/dL — ABNORMAL HIGH (ref 8–23)
CO2: 32 mmol/L (ref 22–32)
Calcium: 9.2 mg/dL (ref 8.9–10.3)
Chloride: 102 mmol/L (ref 98–111)
Creatinine: 1.26 mg/dL — ABNORMAL HIGH (ref 0.61–1.24)
GFR, Estimated: 57 mL/min — ABNORMAL LOW (ref >60)
Glucose, Bld: 142 mg/dL — ABNORMAL HIGH (ref 70–99)
Potassium: 4.1 mmol/L (ref 3.5–5.1)
Sodium: 140 mmol/L (ref 135–145)
Total Bilirubin: 1 mg/dL (ref 0.3–1.2)
Total Protein: 6.1 g/dL — ABNORMAL LOW (ref 6.5–8.1)

## 2022-07-09 MED ORDER — ACETAMINOPHEN 325 MG PO TABS
650.0000 mg | ORAL_TABLET | Freq: Once | ORAL | Status: AC
  Administered 2022-07-09: 650 mg via ORAL
  Filled 2022-07-09: qty 2

## 2022-07-09 MED ORDER — DEXAMETHASONE 4 MG PO TABS
20.0000 mg | ORAL_TABLET | Freq: Once | ORAL | Status: AC
  Administered 2022-07-09: 20 mg via ORAL
  Filled 2022-07-09: qty 5

## 2022-07-09 MED ORDER — BORTEZOMIB CHEMO SQ INJECTION 3.5 MG (2.5MG/ML)
1.3000 mg/m2 | Freq: Once | INTRAMUSCULAR | Status: AC
  Administered 2022-07-09: 2.75 mg via SUBCUTANEOUS
  Filled 2022-07-09: qty 1.1

## 2022-07-09 MED ORDER — DARATUMUMAB-HYALURONIDASE-FIHJ 1800-30000 MG-UT/15ML ~~LOC~~ SOLN
1800.0000 mg | Freq: Once | SUBCUTANEOUS | Status: AC
  Administered 2022-07-09: 1800 mg via SUBCUTANEOUS
  Filled 2022-07-09: qty 15

## 2022-07-09 MED ORDER — DIPHENHYDRAMINE HCL 25 MG PO CAPS
25.0000 mg | ORAL_CAPSULE | Freq: Once | ORAL | Status: AC
  Administered 2022-07-09: 25 mg via ORAL
  Filled 2022-07-09: qty 1

## 2022-07-09 NOTE — Patient Instructions (Signed)
[facility]  Discharge Instructions: Thank you for choosing [facility] to provide your oncology and hematology care.   If you have a lab appointment with the [facility], please go directly to the [facility] and check in at the registration area.  Wear comfortable clothing and clothing appropriate for easy access to any Portacath or PICC line.   We strive to give you quality time with your provider. You may need to reschedule your appointment if you arrive late (15 or more minutes).  Arriving late affects you and other patients whose appointments are after yours.  Also, if you miss three or more appointments without notifying the office, you may be dismissed from the clinic at the provider's discretion.      For prescription refill requests, have your pharmacy contact our office and allow 72 hours for refills to be completed.    Today you received the following chemotherapy and/or immunotherapy agents Velcade/Darzalex      To help prevent nausea and vomiting after your treatment, we encourage you to take your nausea medication as directed.  BELOW ARE SYMPTOMS THAT SHOULD BE REPORTED IMMEDIATELY: *FEVER GREATER THAN 100.4 F (38 C) OR HIGHER *CHILLS OR SWEATING *NAUSEA AND VOMITING THAT IS NOT CONTROLLED WITH YOUR NAUSEA MEDICATION *UNUSUAL SHORTNESS OF BREATH *UNUSUAL BRUISING OR BLEEDING *URINARY PROBLEMS (pain or burning when urinating, or frequent urination) *BOWEL PROBLEMS (unusual diarrhea, constipation, pain near the anus) TENDERNESS IN MOUTH AND THROAT WITH OR WITHOUT PRESENCE OF ULCERS (sore throat, sores in mouth, or a toothache) UNUSUAL RASH, SWELLING OR PAIN  UNUSUAL VAGINAL DISCHARGE OR ITCHING   Items with * indicate a potential emergency and should be followed up as soon as possible or go to the Emergency Department if any problems should occur.  Please show the CHEMOTHERAPY ALERT CARD or IMMUNOTHERAPY ALERT CARD at check-in  to the Emergency Department and triage nurse. Should you have questions after your visit or need to cancel or reschedule your appointment, please contact [facility]  [phone] and follow the prompts.  Office hours are 8:00 a.m. to 4:30 p.m. Monday - Friday. Please note that voicemails left after 4:00 p.m. may not be returned until the following business day.  We are closed weekends and major holidays. You have access to a nurse at all times for urgent questions. Please call the main number to the clinic [phone] and follow the prompts.  For any non-urgent questions, you may also contact your provider using MyChart. We now offer e-Visits for anyone 42 and older to request care online for non-urgent symptoms. For details visit [email or web address].   Also download the MyChart app! Go to the app store, search "MyChart", open the app, select [facility], and log in with your MyChart username and password.  Masks are optional in the cancer centers. If you would like for your care team to wear a mask while they are taking care of you, please let them know. You may have one support person who is at least 82 years old accompany you for your appointments.

## 2022-07-09 NOTE — Progress Notes (Signed)
Ok to treat with platelets 87 per Dr. Marin Olp.

## 2022-07-23 ENCOUNTER — Inpatient Hospital Stay: Payer: Medicare PPO | Admitting: Family

## 2022-07-23 ENCOUNTER — Inpatient Hospital Stay: Payer: Medicare PPO | Attending: Hematology & Oncology

## 2022-07-23 ENCOUNTER — Inpatient Hospital Stay: Payer: Medicare PPO

## 2022-07-23 ENCOUNTER — Encounter: Payer: Self-pay | Admitting: Family

## 2022-07-23 VITALS — BP 126/78 | HR 56 | Temp 97.6°F | Resp 20 | Wt 193.1 lb

## 2022-07-23 DIAGNOSIS — Z5112 Encounter for antineoplastic immunotherapy: Secondary | ICD-10-CM | POA: Insufficient documentation

## 2022-07-23 DIAGNOSIS — D472 Monoclonal gammopathy: Secondary | ICD-10-CM | POA: Diagnosis not present

## 2022-07-23 DIAGNOSIS — Z5111 Encounter for antineoplastic chemotherapy: Secondary | ICD-10-CM | POA: Insufficient documentation

## 2022-07-23 DIAGNOSIS — C9000 Multiple myeloma not having achieved remission: Secondary | ICD-10-CM | POA: Diagnosis not present

## 2022-07-23 LAB — CBC WITH DIFFERENTIAL (CANCER CENTER ONLY)
Abs Immature Granulocytes: 0.05 10*3/uL (ref 0.00–0.07)
Basophils Absolute: 0 10*3/uL (ref 0.0–0.1)
Basophils Relative: 0 %
Eosinophils Absolute: 0.2 10*3/uL (ref 0.0–0.5)
Eosinophils Relative: 3 %
HCT: 35.4 % — ABNORMAL LOW (ref 39.0–52.0)
Hemoglobin: 12.5 g/dL — ABNORMAL LOW (ref 13.0–17.0)
Immature Granulocytes: 1 %
Lymphocytes Relative: 17 %
Lymphs Abs: 0.9 10*3/uL (ref 0.7–4.0)
MCH: 35.1 pg — ABNORMAL HIGH (ref 26.0–34.0)
MCHC: 35.3 g/dL (ref 30.0–36.0)
MCV: 99.4 fL (ref 80.0–100.0)
Monocytes Absolute: 0.5 10*3/uL (ref 0.1–1.0)
Monocytes Relative: 9 %
Neutro Abs: 3.9 10*3/uL (ref 1.7–7.7)
Neutrophils Relative %: 70 %
Platelet Count: 80 10*3/uL — ABNORMAL LOW (ref 150–400)
RBC: 3.56 MIL/uL — ABNORMAL LOW (ref 4.22–5.81)
RDW: 12.4 % (ref 11.5–15.5)
WBC Count: 5.5 10*3/uL (ref 4.0–10.5)
nRBC: 0 % (ref 0.0–0.2)

## 2022-07-23 LAB — CMP (CANCER CENTER ONLY)
ALT: 19 U/L (ref 0–44)
AST: 22 U/L (ref 15–41)
Albumin: 4.3 g/dL (ref 3.5–5.0)
Alkaline Phosphatase: 92 U/L (ref 38–126)
Anion gap: 6 (ref 5–15)
BUN: 28 mg/dL — ABNORMAL HIGH (ref 8–23)
CO2: 33 mmol/L — ABNORMAL HIGH (ref 22–32)
Calcium: 9.2 mg/dL (ref 8.9–10.3)
Chloride: 102 mmol/L (ref 98–111)
Creatinine: 1.12 mg/dL (ref 0.61–1.24)
GFR, Estimated: >60 mL/min (ref >60)
Glucose, Bld: 131 mg/dL — ABNORMAL HIGH (ref 70–99)
Potassium: 4.2 mmol/L (ref 3.5–5.1)
Sodium: 141 mmol/L (ref 135–145)
Total Bilirubin: 1.1 mg/dL (ref 0.3–1.2)
Total Protein: 6.5 g/dL (ref 6.5–8.1)

## 2022-07-23 LAB — LACTATE DEHYDROGENASE: LDH: 335 U/L — ABNORMAL HIGH (ref 98–192)

## 2022-07-23 MED ORDER — DARATUMUMAB-HYALURONIDASE-FIHJ 1800-30000 MG-UT/15ML ~~LOC~~ SOLN
1800.0000 mg | Freq: Once | SUBCUTANEOUS | Status: AC
  Administered 2022-07-23: 1800 mg via SUBCUTANEOUS
  Filled 2022-07-23: qty 15

## 2022-07-23 MED ORDER — ACETAMINOPHEN 325 MG PO TABS
650.0000 mg | ORAL_TABLET | Freq: Once | ORAL | Status: AC
  Administered 2022-07-23: 650 mg via ORAL
  Filled 2022-07-23: qty 2

## 2022-07-23 MED ORDER — BORTEZOMIB CHEMO SQ INJECTION 3.5 MG (2.5MG/ML)
1.3000 mg/m2 | Freq: Once | INTRAMUSCULAR | Status: AC
  Administered 2022-07-23: 2.75 mg via SUBCUTANEOUS
  Filled 2022-07-23: qty 1.1

## 2022-07-23 MED ORDER — DIPHENHYDRAMINE HCL 25 MG PO CAPS
25.0000 mg | ORAL_CAPSULE | Freq: Once | ORAL | Status: AC
  Administered 2022-07-23: 25 mg via ORAL
  Filled 2022-07-23: qty 1

## 2022-07-23 MED ORDER — DEXAMETHASONE 4 MG PO TABS
20.0000 mg | ORAL_TABLET | Freq: Once | ORAL | Status: AC
  Administered 2022-07-23: 20 mg via ORAL
  Filled 2022-07-23: qty 5

## 2022-07-23 NOTE — Progress Notes (Signed)
Hematology and Oncology Follow Up Visit  Riley Eaton [id number] 1940-10-09 81 y.o. 07/23/2022   Principle Diagnosis:  IgG Kappa myeloma  -- +1q and t(11:14)   Current Therapy:        Velcade/Decadron -- ( 3 wk on/1 wk off) -- s/p cycle #8 -- start on 08/24/2021 Faspro - s/p cycle 3 -  started on 04/30/2022   Interim History:  Riley Eaton is here today for follow-up. He is doing quite well but does note fatigue at times.  He has mild SOB at times with over exertion. He will take a break to rest when needed.  No fever, chills, n/v, cough, rash, dizziness, chest pain, palpitations, abdominal pain or changes in bowel or bladder habits.  M-spike last month was stable at 0.2 g/dL, IgG level was 697 mg/dL and 4.78 mg/dL.  He has not noted any blood loss. No abnormal bruising, no petechiae.  No swelling, tenderness, numbness or tingling in her extremities.  No falls or syncope.  Appetite and hydration are good. Weight is stable at 193 lbs.   ECOG Performance Status: 1 - Symptomatic but completely ambulatory  Medications:  Allergies as of 07/23/2022   No Known Allergies      Medication List        Accurate as of July 23, 2022  9:13 AM. If you have any questions, ask your nurse or doctor.          acetaminophen 325 MG tablet Commonly known as: TYLENOL Take 2 tablets (650 mg total) by mouth every 6 (six) hours as needed for mild pain (or temp > 100).   allopurinol 100 MG tablet Commonly known as: ZYLOPRIM TAKE 1 TABLET BY MOUTH EVERY DAY   aspirin 81 MG chewable tablet Chew 1 tablet (81 mg total) by mouth daily.   atorvastatin 40 MG tablet Commonly known as: LIPITOR Take 1 tablet (40 mg total) by mouth daily at 6 PM. What changed: when to take this   carvedilol 6.25 MG tablet Commonly known as: COREG Take 1 tablet by mouth 2 (two) times daily.   colchicine 0.6 MG tablet   cyanocobalamin 500 MCG tablet Commonly known as: VITAMIN B12 Take 500 mcg by mouth  daily.   diphenhydramine-acetaminophen 25-500 MG Tabs tablet Commonly known as: TYLENOL PM Take 1 tablet by mouth at bedtime.   finasteride 5 MG tablet Commonly known as: PROSCAR Take 1 tablet (5 mg total) by mouth daily. For urination   furosemide 40 MG tablet Commonly known as: LASIX TAKE 1 TABLET BY MOUTH EVERY DAY   HYDROcodone-acetaminophen 5-325 MG tablet Commonly known as: NORCO/VICODIN Take 1 tablet by mouth 4 (four) times daily.   Movantik 25 MG Tabs tablet Generic drug: naloxegol oxalate Take 25 mg by mouth daily.   OLANZapine 5 MG tablet Commonly known as: ZYPREXA Take 1 tablet (5 mg total) by mouth daily.   ondansetron 4 MG disintegrating tablet Commonly known as: Zofran ODT Take 1 tablet (4 mg total) by mouth every 8 (eight) hours as needed for nausea or vomiting.   pantoprazole 40 MG tablet Commonly known as: PROTONIX Take 1 tablet (40 mg total) by mouth at bedtime. What changed: when to take this   polyethylene glycol 17 g packet Commonly known as: MIRALAX / GLYCOLAX Take 17 g by mouth daily. What changed:  when to take this reasons to take this   pregabalin 150 MG capsule Commonly known as: LYRICA Take 150 mg by mouth 2 (two) times daily.  traZODone 50 MG tablet Commonly known as: DESYREL Take 50 mg by mouth at bedtime.        Allergies: No Known Allergies  Past Medical History, Surgical history, Social history, and Family History were reviewed and updated.  Review of Systems: All other 10 point review of systems is negative.   Physical Exam:  weight is 193 lb 1.3 oz (87.6 kg). His oral temperature is 97.6 F (36.4 C). His blood pressure is 126/78 and his pulse is 56 (abnormal). His respiration is 20 and oxygen saturation is 97%.   Wt Readings from Last 3 Encounters:  07/23/22 193 lb 1.3 oz (87.6 kg)  06/25/22 193 lb (87.5 kg)  05/28/22 190 lb 12.8 oz (86.5 kg)    Ocular: Sclerae unicteric, pupils equal, round and reactive to  light Ear-nose-throat: Oropharynx clear, dentition fair Lymphatic: No cervical or supraclavicular adenopathy Lungs no rales or rhonchi, good excursion bilaterally Heart regular rate and rhythm, no murmur appreciated Abd soft, nontender, positive bowel sounds MSK no focal spinal tenderness, no joint edema Neuro: non-focal, well-oriented, appropriate affect Breasts: Deferred   Lab Results  Component Value Date   WBC 5.5 07/23/2022   HGB 12.5 (L) 07/23/2022   HCT 35.4 (L) 07/23/2022   MCV 99.4 07/23/2022   PLT 80 (L) 07/23/2022   No results found for: "FERRITIN", "IRON", "TIBC", "UIBC", "IRONPCTSAT" Lab Results  Component Value Date   RBC 3.56 (L) 07/23/2022   Lab Results  Component Value Date   KPAFRELGTCHN 47.8 (H) 06/25/2022   LAMBDASER 15.3 06/25/2022   KAPLAMBRATIO 3.12 (H) 06/25/2022   Lab Results  Component Value Date   IGGSERUM 697 06/25/2022   IGGSERUM 764 06/25/2022   IGA 44 (L) 06/25/2022   IGA 47 (L) 06/25/2022   IGMSERUM 28 06/25/2022   IGMSERUM 27 06/25/2022   Lab Results  Component Value Date   TOTALPROTELP 5.8 (L) 06/25/2022   ALBUMINELP 3.8 06/25/2022   A1GS 0.2 06/25/2022   A2GS 0.5 06/25/2022   BETS 0.7 06/25/2022   GAMS 0.6 06/25/2022   MSPIKE 0.2 (H) 06/25/2022   SPEI Comment 12/20/2021     Chemistry      Component Value Date/Time   NA 140 07/09/2022 1031   NA 142 10/15/2019 1500   K 4.1 07/09/2022 1031   CL 102 07/09/2022 1031   CO2 32 07/09/2022 1031   BUN 30 (H) 07/09/2022 1031   BUN 25 10/15/2019 1500   CREATININE 1.26 (H) 07/09/2022 1031      Component Value Date/Time   CALCIUM 9.2 07/09/2022 1031   ALKPHOS 94 07/09/2022 1031   AST 62 (H) 07/09/2022 1031   ALT 56 (H) 07/09/2022 1031   BILITOT 1.0 07/09/2022 1031       Impression and Plan: Riley Eaton is a very pleasant 81 yo caucasian gentleman with IgG Kappa myeloma.  He is doing well on Faspro so far.  We will proceed with treatment today as planned.  Follow-up in 1  month.  Lottie Dawson, NP 12/12/20239:13 AM

## 2022-07-23 NOTE — Progress Notes (Signed)
Ok to treat with platelets 80 per Lottie Dawson, NP

## 2022-07-24 LAB — KAPPA/LAMBDA LIGHT CHAINS
Kappa free light chain: 45 mg/L — ABNORMAL HIGH (ref 3.3–19.4)
Kappa, lambda light chain ratio: 2.68 — ABNORMAL HIGH (ref 0.26–1.65)
Lambda free light chains: 16.8 mg/L (ref 5.7–26.3)

## 2022-07-25 ENCOUNTER — Other Ambulatory Visit: Payer: Self-pay

## 2022-07-27 LAB — IGG, IGA, IGM
IgA: 48 mg/dL — ABNORMAL LOW (ref 61–437)
IgG (Immunoglobin G), Serum: 686 mg/dL (ref 603–1613)
IgM (Immunoglobulin M), Srm: 21 mg/dL (ref 15–143)

## 2022-07-30 ENCOUNTER — Other Ambulatory Visit: Payer: Self-pay

## 2022-07-31 LAB — PROTEIN ELECTROPHORESIS, SERUM, WITH REFLEX
A/G Ratio: 1.9 — ABNORMAL HIGH (ref 0.7–1.7)
Albumin ELP: 3.9 g/dL (ref 2.9–4.4)
Alpha-1-Globulin: 0.3 g/dL (ref 0.0–0.4)
Alpha-2-Globulin: 0.5 g/dL (ref 0.4–1.0)
Beta Globulin: 0.8 g/dL (ref 0.7–1.3)
Gamma Globulin: 0.6 g/dL (ref 0.4–1.8)
Globulin, Total: 2.1 g/dL — ABNORMAL LOW (ref 2.2–3.9)
M-Spike, %: 0.2 g/dL — ABNORMAL HIGH
SPEP Interpretation: 0
Total Protein ELP: 6 g/dL (ref 6.0–8.5)

## 2022-07-31 LAB — IMMUNOFIXATION REFLEX, SERUM
IgA: 45 mg/dL — ABNORMAL LOW (ref 61–437)
IgG (Immunoglobin G), Serum: 669 mg/dL (ref 603–1613)
IgM (Immunoglobulin M), Srm: 21 mg/dL (ref 15–143)

## 2022-08-01 ENCOUNTER — Other Ambulatory Visit: Payer: Self-pay

## 2022-08-14 NOTE — Progress Notes (Signed)
Faspro added to cycle 5 and 6 per Dr. Antonieta Pert instructions.

## 2022-08-15 ENCOUNTER — Other Ambulatory Visit: Payer: Self-pay | Admitting: Physical Medicine and Rehabilitation

## 2022-08-15 ENCOUNTER — Ambulatory Visit
Admission: RE | Admit: 2022-08-15 | Discharge: 2022-08-15 | Disposition: A | Discharge status: Home / Self Care | Payer: Medicare PPO | Source: Ambulatory Visit | Attending: Physical Medicine and Rehabilitation | Admitting: Physical Medicine and Rehabilitation

## 2022-08-15 DIAGNOSIS — M25511 Pain in right shoulder: Secondary | ICD-10-CM

## 2022-08-18 ENCOUNTER — Other Ambulatory Visit: Payer: Self-pay | Admitting: Hematology & Oncology

## 2022-08-18 DIAGNOSIS — D472 Monoclonal gammopathy: Secondary | ICD-10-CM

## 2022-08-21 ENCOUNTER — Other Ambulatory Visit: Payer: Self-pay

## 2022-08-21 ENCOUNTER — Inpatient Hospital Stay: Payer: Medicare PPO | Attending: Hematology & Oncology

## 2022-08-21 ENCOUNTER — Inpatient Hospital Stay: Payer: Medicare PPO | Admitting: Family

## 2022-08-21 ENCOUNTER — Inpatient Hospital Stay: Payer: Medicare PPO

## 2022-08-21 ENCOUNTER — Encounter: Payer: Self-pay | Admitting: Family

## 2022-08-21 VITALS — BP 105/61 | HR 57 | Temp 97.9°F | Resp 19 | Ht 67.0 in | Wt 193.8 lb

## 2022-08-21 DIAGNOSIS — Z5112 Encounter for antineoplastic immunotherapy: Secondary | ICD-10-CM | POA: Diagnosis not present

## 2022-08-21 DIAGNOSIS — D472 Monoclonal gammopathy: Secondary | ICD-10-CM

## 2022-08-21 DIAGNOSIS — M549 Dorsalgia, unspecified: Secondary | ICD-10-CM | POA: Diagnosis not present

## 2022-08-21 DIAGNOSIS — C9000 Multiple myeloma not having achieved remission: Secondary | ICD-10-CM | POA: Insufficient documentation

## 2022-08-21 DIAGNOSIS — Z5111 Encounter for antineoplastic chemotherapy: Secondary | ICD-10-CM | POA: Diagnosis not present

## 2022-08-21 DIAGNOSIS — G8929 Other chronic pain: Secondary | ICD-10-CM | POA: Diagnosis not present

## 2022-08-21 LAB — CBC WITH DIFFERENTIAL (CANCER CENTER ONLY)
Abs Immature Granulocytes: 0.02 10*3/uL (ref 0.00–0.07)
Basophils Absolute: 0 10*3/uL (ref 0.0–0.1)
Basophils Relative: 1 %
Eosinophils Absolute: 0.2 10*3/uL (ref 0.0–0.5)
Eosinophils Relative: 4 %
HCT: 36.3 % — ABNORMAL LOW (ref 39.0–52.0)
Hemoglobin: 12.8 g/dL — ABNORMAL LOW (ref 13.0–17.0)
Immature Granulocytes: 1 %
Lymphocytes Relative: 27 %
Lymphs Abs: 1.2 10*3/uL (ref 0.7–4.0)
MCH: 34.5 pg — ABNORMAL HIGH (ref 26.0–34.0)
MCHC: 35.3 g/dL (ref 30.0–36.0)
MCV: 97.8 fL (ref 80.0–100.0)
Monocytes Absolute: 0.3 10*3/uL (ref 0.1–1.0)
Monocytes Relative: 7 %
Neutro Abs: 2.6 10*3/uL (ref 1.7–7.7)
Neutrophils Relative %: 60 %
Platelet Count: 95 10*3/uL — ABNORMAL LOW (ref 150–400)
RBC: 3.71 MIL/uL — ABNORMAL LOW (ref 4.22–5.81)
RDW: 11.9 % (ref 11.5–15.5)
WBC Count: 4.3 10*3/uL (ref 4.0–10.5)
nRBC: 0 % (ref 0.0–0.2)

## 2022-08-21 LAB — CMP (CANCER CENTER ONLY)
ALT: 16 U/L (ref 0–44)
AST: 27 U/L (ref 15–41)
Albumin: 4 g/dL (ref 3.5–5.0)
Alkaline Phosphatase: 83 U/L (ref 38–126)
Anion gap: 9 (ref 5–15)
BUN: 21 mg/dL (ref 8–23)
CO2: 26 mmol/L (ref 22–32)
Calcium: 9.2 mg/dL (ref 8.9–10.3)
Chloride: 102 mmol/L (ref 98–111)
Creatinine: 0.96 mg/dL (ref 0.61–1.24)
GFR, Estimated: >60 mL/min (ref >60)
Glucose, Bld: 131 mg/dL — ABNORMAL HIGH (ref 70–99)
Potassium: 4 mmol/L (ref 3.5–5.1)
Sodium: 137 mmol/L (ref 135–145)
Total Bilirubin: 1.1 mg/dL (ref 0.3–1.2)
Total Protein: 6.5 g/dL (ref 6.5–8.1)

## 2022-08-21 LAB — LACTATE DEHYDROGENASE: LDH: 311 U/L — ABNORMAL HIGH (ref 98–192)

## 2022-08-21 MED ORDER — BORTEZOMIB CHEMO SQ INJECTION 3.5 MG (2.5MG/ML)
1.3000 mg/m2 | Freq: Once | INTRAMUSCULAR | Status: AC
  Administered 2022-08-21: 2.75 mg via SUBCUTANEOUS
  Filled 2022-08-21: qty 1.1

## 2022-08-21 MED ORDER — DARATUMUMAB-HYALURONIDASE-FIHJ 1800-30000 MG-UT/15ML ~~LOC~~ SOLN
1800.0000 mg | Freq: Once | SUBCUTANEOUS | Status: AC
  Administered 2022-08-21: 1800 mg via SUBCUTANEOUS
  Filled 2022-08-21: qty 15

## 2022-08-21 MED ORDER — ACETAMINOPHEN 325 MG PO TABS
650.0000 mg | ORAL_TABLET | Freq: Once | ORAL | Status: AC
  Administered 2022-08-21: 650 mg via ORAL
  Filled 2022-08-21: qty 2

## 2022-08-21 MED ORDER — DEXAMETHASONE 4 MG PO TABS
20.0000 mg | ORAL_TABLET | Freq: Once | ORAL | Status: AC
  Administered 2022-08-21: 20 mg via ORAL
  Filled 2022-08-21: qty 5

## 2022-08-21 MED ORDER — DIPHENHYDRAMINE HCL 25 MG PO CAPS
25.0000 mg | ORAL_CAPSULE | Freq: Once | ORAL | Status: AC
  Administered 2022-08-21: 25 mg via ORAL
  Filled 2022-08-21: qty 1

## 2022-08-21 NOTE — Progress Notes (Signed)
Hematology and Oncology Follow Up Visit  Riley Eaton [id number] 1941-07-10 82 y.o. 08/21/2022   Principle Diagnosis:  IgG Kappa myeloma  -- +1q and t(11:14)   Current Therapy:        Velcade/Decadron -- ( 3 wk on/1 wk off) -- s/p cycle #8 -- start on 08/24/2021 Faspro - s/p cycle 4 -  started on 04/30/2022   Interim History:  Riley Eaton is here today for follow-up and treatment. He is doing fairly well. He has chronic lower back pain.  Last month, M-spike was 0.2 g/dL, IgG level 686 mg/dL and kappa light chains 4.50 mg/dL.  No fever, chills, n/v, cough, rash, dizziness, SOB, chest pain, palpitations, abdominal pain or changes in bowel or bladder habits.  No blood loss noted. No bruising or petechiae.  No swelling in his extremities.  No falls or syncope.  Appetite and hydration are good. Weight is stable at 193 lbs.    ECOG Performance Status: 1 - Symptomatic but completely ambulatory  Medications:  Allergies as of 08/21/2022   No Known Allergies      Medication List        Accurate as of August 21, 2022  9:32 AM. If you have any questions, ask your nurse or doctor.          acetaminophen 325 MG tablet Commonly known as: TYLENOL Take 2 tablets (650 mg total) by mouth every 6 (six) hours as needed for mild pain (or temp > 100).   allopurinol 100 MG tablet Commonly known as: ZYLOPRIM TAKE 1 TABLET BY MOUTH EVERY DAY   aspirin 81 MG chewable tablet Chew 1 tablet (81 mg total) by mouth daily.   atorvastatin 40 MG tablet Commonly known as: LIPITOR Take 1 tablet (40 mg total) by mouth daily at 6 PM. What changed: when to take this   carvedilol 6.25 MG tablet Commonly known as: COREG Take 1 tablet by mouth 2 (two) times daily.   colchicine 0.6 MG tablet   cyanocobalamin 500 MCG tablet Commonly known as: VITAMIN B12 Take 500 mcg by mouth daily.   diphenhydramine-acetaminophen 25-500 MG Tabs tablet Commonly known as: TYLENOL PM Take 1 tablet by mouth  at bedtime.   finasteride 5 MG tablet Commonly known as: PROSCAR Take 1 tablet (5 mg total) by mouth daily. For urination   furosemide 40 MG tablet Commonly known as: LASIX TAKE 1 TABLET BY MOUTH EVERY DAY   HYDROcodone-acetaminophen 5-325 MG tablet Commonly known as: NORCO/VICODIN Take 1 tablet by mouth 4 (four) times daily.   HYDROcodone-acetaminophen 7.5-325 MG tablet Commonly known as: NORCO Take 1 tablet by mouth 4 (four) times daily as needed for moderate pain.   lidocaine-prilocaine cream Commonly known as: EMLA Apply 1 Application topically.   LORazepam 0.5 MG tablet Commonly known as: ATIVAN TAKE 1 TABLET (0.5 MG TOTAL) BY MOUTH EVERY 6 (SIX) HOURS AS NEEDED (NAUSEA OR VOMITING).   losartan 25 MG tablet Commonly known as: COZAAR Take 25 mg by mouth daily.   Movantik 25 MG Tabs tablet Generic drug: naloxegol oxalate Take 25 mg by mouth daily.   OLANZapine 5 MG tablet Commonly known as: ZYPREXA Take 1 tablet (5 mg total) by mouth daily.   ondansetron 4 MG disintegrating tablet Commonly known as: Zofran ODT Take 1 tablet (4 mg total) by mouth every 8 (eight) hours as needed for nausea or vomiting.   pantoprazole 40 MG tablet Commonly known as: PROTONIX Take 1 tablet (40 mg total) by mouth at bedtime.  What changed: when to take this   polyethylene glycol 17 g packet Commonly known as: MIRALAX / GLYCOLAX Take 17 g by mouth daily. What changed:  when to take this reasons to take this   pregabalin 150 MG capsule Commonly known as: LYRICA Take 150 mg by mouth 2 (two) times daily.   traZODone 50 MG tablet Commonly known as: DESYREL Take 50 mg by mouth at bedtime.        Allergies: No Known Allergies  Past Medical History, Surgical history, Social history, and Family History were reviewed and updated.  Review of Systems: All other 10 point review of systems is negative.   Physical Exam:  height is '5\' 7"'$  (1.702 m) and weight is 193 lb 12.8 oz  (87.9 kg). His oral temperature is 97.9 F (36.6 C). His blood pressure is 105/61 and his pulse is 57 (abnormal). His respiration is 19 and oxygen saturation is 99%.   Wt Readings from Last 3 Encounters:  08/21/22 193 lb 12.8 oz (87.9 kg)  07/23/22 193 lb 1.3 oz (87.6 kg)  06/25/22 193 lb (87.5 kg)    Ocular: Sclerae unicteric, pupils equal, round and reactive to light Ear-nose-throat: Oropharynx clear, dentition fair Lymphatic: No cervical or supraclavicular adenopathy Lungs no rales or rhonchi, good excursion bilaterally Heart regular rate and rhythm, no murmur appreciated Abd soft, nontender, positive bowel sounds MSK no focal spinal tenderness, no joint edema Neuro: non-focal, well-oriented, appropriate affect Breasts: Deferred   Lab Results  Component Value Date   WBC 4.3 08/21/2022   HGB 12.8 (L) 08/21/2022   HCT 36.3 (L) 08/21/2022   MCV 97.8 08/21/2022   PLT 95 (L) 08/21/2022   No results found for: "FERRITIN", "IRON", "TIBC", "UIBC", "IRONPCTSAT" Lab Results  Component Value Date   RBC 3.71 (L) 08/21/2022   Lab Results  Component Value Date   KPAFRELGTCHN 45.0 (H) 07/23/2022   LAMBDASER 16.8 07/23/2022   KAPLAMBRATIO 2.68 (H) 07/23/2022   Lab Results  Component Value Date   IGGSERUM 686 07/23/2022   IGGSERUM 669 07/23/2022   IGA 48 (L) 07/23/2022   IGA 45 (L) 07/23/2022   IGMSERUM 21 07/23/2022   IGMSERUM 21 07/23/2022   Lab Results  Component Value Date   TOTALPROTELP 6.0 07/23/2022   ALBUMINELP 3.9 07/23/2022   A1GS 0.3 07/23/2022   A2GS 0.5 07/23/2022   BETS 0.8 07/23/2022   GAMS 0.6 07/23/2022   MSPIKE 0.2 (H) 07/23/2022   SPEI Comment 12/20/2021     Chemistry      Component Value Date/Time   NA 141 07/23/2022 0847   NA 142 10/15/2019 1500   K 4.2 07/23/2022 0847   CL 102 07/23/2022 0847   CO2 33 (H) 07/23/2022 0847   BUN 28 (H) 07/23/2022 0847   BUN 25 10/15/2019 1500   CREATININE 1.12 07/23/2022 0847      Component Value Date/Time    CALCIUM 9.2 07/23/2022 0847   ALKPHOS 92 07/23/2022 0847   AST 22 07/23/2022 0847   ALT 19 07/23/2022 0847   BILITOT 1.1 07/23/2022 0847       Impression and Plan: Riley Eaton is a very pleasant 82 yo caucasian gentleman with IgG Kappa myeloma.  He is doing well on Faspro so far.  We will proceed with treatment today as planned.  Follow-up in 1 month.   Lottie Dawson, NP 1/10/20249:32 AM

## 2022-08-21 NOTE — Addendum Note (Signed)
Addended by: Burney Gauze R on: 08/21/2022 10:20 AM   Modules accepted: Orders

## 2022-08-22 ENCOUNTER — Other Ambulatory Visit: Payer: Self-pay

## 2022-08-22 LAB — KAPPA/LAMBDA LIGHT CHAINS
Kappa free light chain: 54.8 mg/L — ABNORMAL HIGH (ref 3.3–19.4)
Kappa, lambda light chain ratio: 3.15 — ABNORMAL HIGH (ref 0.26–1.65)
Lambda free light chains: 17.4 mg/L (ref 5.7–26.3)

## 2022-08-23 ENCOUNTER — Other Ambulatory Visit: Payer: Self-pay

## 2022-08-23 LAB — PROTEIN ELECTROPHORESIS, SERUM
A/G Ratio: 1.8 — ABNORMAL HIGH (ref 0.7–1.7)
Albumin ELP: 3.8 g/dL (ref 2.9–4.4)
Alpha-1-Globulin: 0.3 g/dL (ref 0.0–0.4)
Alpha-2-Globulin: 0.5 g/dL (ref 0.4–1.0)
Beta Globulin: 0.7 g/dL (ref 0.7–1.3)
Gamma Globulin: 0.7 g/dL (ref 0.4–1.8)
Globulin, Total: 2.1 g/dL — ABNORMAL LOW (ref 2.2–3.9)
M-Spike, %: 0.2 g/dL — ABNORMAL HIGH
Total Protein ELP: 5.9 g/dL — ABNORMAL LOW (ref 6.0–8.5)

## 2022-08-23 LAB — IGG, IGA, IGM
IgA: 48 mg/dL — ABNORMAL LOW (ref 61–437)
IgG (Immunoglobin G), Serum: 698 mg/dL (ref 603–1613)
IgM (Immunoglobulin M), Srm: 21 mg/dL (ref 15–143)

## 2022-09-12 ENCOUNTER — Other Ambulatory Visit: Payer: Self-pay | Admitting: Hematology & Oncology

## 2022-09-12 ENCOUNTER — Other Ambulatory Visit: Payer: Self-pay | Admitting: Pharmacist

## 2022-09-12 NOTE — Progress Notes (Signed)
Clarification of treatment careplan:  Patient to receive Faspro 28 days, Velcade on days 1 and 8 of each cycle.  Dr. Marin Olp has amended the careplan to reflect this. Cycle 6 to start on 09/19/22.

## 2022-09-13 ENCOUNTER — Other Ambulatory Visit: Payer: Self-pay

## 2022-09-19 ENCOUNTER — Inpatient Hospital Stay (HOSPITAL_BASED_OUTPATIENT_CLINIC_OR_DEPARTMENT_OTHER): Payer: Medicare PPO | Admitting: Medical Oncology

## 2022-09-19 ENCOUNTER — Encounter: Payer: Self-pay | Admitting: Medical Oncology

## 2022-09-19 ENCOUNTER — Inpatient Hospital Stay: Payer: Medicare PPO | Attending: Hematology & Oncology

## 2022-09-19 ENCOUNTER — Encounter: Payer: Self-pay | Admitting: Hematology & Oncology

## 2022-09-19 ENCOUNTER — Inpatient Hospital Stay: Payer: Medicare PPO

## 2022-09-19 VITALS — BP 119/78 | HR 61 | Temp 97.7°F | Resp 19

## 2022-09-19 DIAGNOSIS — D472 Monoclonal gammopathy: Secondary | ICD-10-CM | POA: Diagnosis not present

## 2022-09-19 DIAGNOSIS — D709 Neutropenia, unspecified: Secondary | ICD-10-CM | POA: Diagnosis not present

## 2022-09-19 DIAGNOSIS — Z5112 Encounter for antineoplastic immunotherapy: Secondary | ICD-10-CM | POA: Insufficient documentation

## 2022-09-19 DIAGNOSIS — Z5111 Encounter for antineoplastic chemotherapy: Secondary | ICD-10-CM | POA: Diagnosis present

## 2022-09-19 DIAGNOSIS — C9000 Multiple myeloma not having achieved remission: Secondary | ICD-10-CM | POA: Diagnosis not present

## 2022-09-19 LAB — CBC WITH DIFFERENTIAL (CANCER CENTER ONLY)
Abs Immature Granulocytes: 0.02 10*3/uL (ref 0.00–0.07)
Basophils Absolute: 0 10*3/uL (ref 0.0–0.1)
Basophils Relative: 1 %
Eosinophils Absolute: 0.3 10*3/uL (ref 0.0–0.5)
Eosinophils Relative: 7 %
HCT: 38.1 % — ABNORMAL LOW (ref 39.0–52.0)
Hemoglobin: 13.3 g/dL (ref 13.0–17.0)
Immature Granulocytes: 1 %
Lymphocytes Relative: 35 %
Lymphs Abs: 1.3 10*3/uL (ref 0.7–4.0)
MCH: 33.7 pg (ref 26.0–34.0)
MCHC: 34.9 g/dL (ref 30.0–36.0)
MCV: 96.5 fL (ref 80.0–100.0)
Monocytes Absolute: 0.3 10*3/uL (ref 0.1–1.0)
Monocytes Relative: 9 %
Neutro Abs: 1.7 10*3/uL (ref 1.7–7.7)
Neutrophils Relative %: 47 %
Platelet Count: 91 10*3/uL — ABNORMAL LOW (ref 150–400)
RBC: 3.95 MIL/uL — ABNORMAL LOW (ref 4.22–5.81)
RDW: 11.8 % (ref 11.5–15.5)
WBC Count: 3.6 10*3/uL — ABNORMAL LOW (ref 4.0–10.5)
nRBC: 0 % (ref 0.0–0.2)

## 2022-09-19 LAB — CMP (CANCER CENTER ONLY)
ALT: 16 U/L (ref 0–44)
AST: 25 U/L (ref 15–41)
Albumin: 4.6 g/dL (ref 3.5–5.0)
Alkaline Phosphatase: 95 U/L (ref 38–126)
Anion gap: 8 (ref 5–15)
BUN: 27 mg/dL — ABNORMAL HIGH (ref 8–23)
CO2: 30 mmol/L (ref 22–32)
Calcium: 9.6 mg/dL (ref 8.9–10.3)
Chloride: 103 mmol/L (ref 98–111)
Creatinine: 1.16 mg/dL (ref 0.61–1.24)
GFR, Estimated: >60 mL/min (ref >60)
Glucose, Bld: 99 mg/dL (ref 70–99)
Potassium: 4 mmol/L (ref 3.5–5.1)
Sodium: 141 mmol/L (ref 135–145)
Total Bilirubin: 0.9 mg/dL (ref 0.3–1.2)
Total Protein: 6.7 g/dL (ref 6.5–8.1)

## 2022-09-19 LAB — LACTATE DEHYDROGENASE: LDH: 335 U/L — ABNORMAL HIGH (ref 98–192)

## 2022-09-19 MED ORDER — DEXAMETHASONE 4 MG PO TABS
20.0000 mg | ORAL_TABLET | Freq: Once | ORAL | Status: AC
  Administered 2022-09-19: 20 mg via ORAL
  Filled 2022-09-19: qty 5

## 2022-09-19 MED ORDER — DIPHENHYDRAMINE HCL 25 MG PO CAPS
25.0000 mg | ORAL_CAPSULE | Freq: Once | ORAL | Status: AC
  Administered 2022-09-19: 25 mg via ORAL
  Filled 2022-09-19: qty 1

## 2022-09-19 MED ORDER — ACETAMINOPHEN 325 MG PO TABS
650.0000 mg | ORAL_TABLET | Freq: Once | ORAL | Status: AC
  Administered 2022-09-19: 650 mg via ORAL
  Filled 2022-09-19: qty 2

## 2022-09-19 MED ORDER — DARATUMUMAB-HYALURONIDASE-FIHJ 1800-30000 MG-UT/15ML ~~LOC~~ SOLN
1800.0000 mg | Freq: Once | SUBCUTANEOUS | Status: AC
  Administered 2022-09-19: 1800 mg via SUBCUTANEOUS
  Filled 2022-09-19: qty 15

## 2022-09-19 MED ORDER — BORTEZOMIB CHEMO SQ INJECTION 3.5 MG (2.5MG/ML)
1.3000 mg/m2 | Freq: Once | INTRAMUSCULAR | Status: AC
  Administered 2022-09-19: 2.75 mg via SUBCUTANEOUS
  Filled 2022-09-19: qty 1.1

## 2022-09-19 NOTE — Progress Notes (Signed)
OK to treat with platelets value from today per Nelwyn Salisbury, NP.

## 2022-09-19 NOTE — Progress Notes (Signed)
Hematology and Oncology Follow Up Visit  TIMMOTHY BARANOWSKI [id number] September 13, 1940 82 y.o. 09/19/2022   Principle Diagnosis:  IgG Kappa myeloma  -- +1q and t(11:14)   Current Therapy:        Velcade/Decadron -- ( 3 wk on/1 wk off) -- s/p cycle #8 -- start on 08/24/2021 Faspro - s/p cycle 5 -  started on 04/30/2022   Interim History:  Mr. Hayashi is here today for follow-up and treatment. He reports that overall he is doing pretty well. Still getting around with assistance of his rolling walker. He denies any side effects from treatment. No fevers, night sweats or unintentional weight loss.    In January, M-spike was 0.2 g/dL, IgG level 698 mg/dL and kappa light chains 5.48 mg/dL.    No fever, chills, n/v, cough, rash, dizziness, SOB, chest pain, palpitations, abdominal pain or changes in bowel or bladder habits.  No blood loss noted. No bruising or petechiae.  No swelling in his extremities.  No falls or syncope.  Appetite and hydration are good. Weight is stable at 193 lbs.    ECOG Performance Status: 1 - Symptomatic but completely ambulatory  Medications:  Allergies as of 09/19/2022   No Known Allergies      Medication List        Accurate as of September 19, 2022 10:51 AM. If you have any questions, ask your nurse or doctor.          STOP taking these medications    lidocaine-prilocaine cream Commonly known as: EMLA Stopped by: Hughie Closs, PA-C       TAKE these medications    acetaminophen 325 MG tablet Commonly known as: TYLENOL Take 2 tablets (650 mg total) by mouth every 6 (six) hours as needed for mild pain (or temp > 100).   allopurinol 100 MG tablet Commonly known as: ZYLOPRIM TAKE 1 TABLET BY MOUTH EVERY DAY   aspirin 81 MG chewable tablet Chew 1 tablet (81 mg total) by mouth daily.   atorvastatin 40 MG tablet Commonly known as: LIPITOR Take 1 tablet (40 mg total) by mouth daily at 6 PM. What changed: when to take this   carvedilol 6.25  MG tablet Commonly known as: COREG Take 1 tablet by mouth 2 (two) times daily.   colchicine 0.6 MG tablet   cyanocobalamin 500 MCG tablet Commonly known as: VITAMIN B12 Take 500 mcg by mouth daily.   diphenhydramine-acetaminophen 25-500 MG Tabs tablet Commonly known as: TYLENOL PM Take 1 tablet by mouth at bedtime.   finasteride 5 MG tablet Commonly known as: PROSCAR Take 1 tablet (5 mg total) by mouth daily. For urination   furosemide 40 MG tablet Commonly known as: LASIX TAKE 1 TABLET BY MOUTH EVERY DAY   HYDROcodone-acetaminophen 7.5-325 MG tablet Commonly known as: NORCO Take 1 tablet by mouth 4 (four) times daily as needed for moderate pain.   LORazepam 0.5 MG tablet Commonly known as: ATIVAN TAKE 1 TABLET (0.5 MG TOTAL) BY MOUTH EVERY 6 (SIX) HOURS AS NEEDED (NAUSEA OR VOMITING).   losartan 25 MG tablet Commonly known as: COZAAR Take 25 mg by mouth daily.   Movantik 25 MG Tabs tablet Generic drug: naloxegol oxalate Take 25 mg by mouth daily.   OLANZapine 5 MG tablet Commonly known as: ZYPREXA Take 1 tablet (5 mg total) by mouth daily.   ondansetron 4 MG disintegrating tablet Commonly known as: Zofran ODT Take 1 tablet (4 mg total) by mouth every 8 (eight) hours as  needed for nausea or vomiting.   pantoprazole 40 MG tablet Commonly known as: PROTONIX Take 1 tablet (40 mg total) by mouth at bedtime. What changed: when to take this   polyethylene glycol 17 g packet Commonly known as: MIRALAX / GLYCOLAX Take 17 g by mouth daily. What changed:  when to take this reasons to take this   pregabalin 150 MG capsule Commonly known as: LYRICA Take 150 mg by mouth 2 (two) times daily.   traZODone 50 MG tablet Commonly known as: DESYREL Take 50 mg by mouth at bedtime.        Allergies: No Known Allergies  Past Medical History, Surgical history, Social history, and Family History were reviewed and updated.  Review of Systems: All other 10 point review  of systems is negative.   Physical Exam:  oral temperature is 97.7 F (36.5 C). His blood pressure is 119/78 and his pulse is 61. His respiration is 19 and oxygen saturation is 97%.   Wt Readings from Last 3 Encounters:  08/21/22 193 lb 12.8 oz (87.9 kg)  07/23/22 193 lb 1.3 oz (87.6 kg)  06/25/22 193 lb (87.5 kg)   General: Using rolling walker well  Ocular: Sclerae unicteric, pupils equal, round and reactive to light Ear-nose-throat: Oropharynx clear, dentition fair Lymphatic: No cervical or supraclavicular adenopathy Lungs no rales or rhonchi, good excursion bilaterally Heart regular rate and rhythm, no murmur appreciated Abd soft, nontender, positive bowel sounds MSK no focal spinal tenderness, no joint edema Neuro: non-focal, well-oriented, appropriate affect  Lab Results  Component Value Date   WBC 3.6 (L) 09/19/2022   HGB 13.3 09/19/2022   HCT 38.1 (L) 09/19/2022   MCV 96.5 09/19/2022   PLT 91 (L) 09/19/2022   No results found for: "FERRITIN", "IRON", "TIBC", "UIBC", "IRONPCTSAT" Lab Results  Component Value Date   RBC 3.95 (L) 09/19/2022   Lab Results  Component Value Date   KPAFRELGTCHN 54.8 (H) 08/21/2022   LAMBDASER 17.4 08/21/2022   KAPLAMBRATIO 3.15 (H) 08/21/2022   Lab Results  Component Value Date   IGGSERUM 698 08/21/2022   IGA 48 (L) 08/21/2022   IGMSERUM 21 08/21/2022   Lab Results  Component Value Date   TOTALPROTELP 5.9 (L) 08/21/2022   ALBUMINELP 3.8 08/21/2022   A1GS 0.3 08/21/2022   A2GS 0.5 08/21/2022   BETS 0.7 08/21/2022   GAMS 0.7 08/21/2022   MSPIKE 0.2 (H) 08/21/2022   SPEI Comment 08/21/2022     Chemistry      Component Value Date/Time   NA 141 09/19/2022 0957   NA 142 10/15/2019 1500   K 4.0 09/19/2022 0957   CL 103 09/19/2022 0957   CO2 30 09/19/2022 0957   BUN 27 (H) 09/19/2022 0957   BUN 25 10/15/2019 1500   CREATININE 1.16 09/19/2022 0957      Component Value Date/Time   CALCIUM 9.6 09/19/2022 0957   ALKPHOS 95  09/19/2022 0957   AST 25 09/19/2022 0957   ALT 16 09/19/2022 0957   BILITOT 0.9 09/19/2022 0957       Impression and Plan: Mr. Nazaire is a very pleasant 82 yo caucasian gentleman with IgG Kappa myeloma.   Currently on once monthly Faspro which he is tolerating well so far. Reviewed labs which show slight neutropenia. Still in range for treatment however we will monitor this at his next visit and will hold treatment if needed.    We will proceed with treatment today as planned.  Follow-up in 1 month.  Hughie Closs, PA-C 2/8/202410:51 AM

## 2022-09-20 ENCOUNTER — Other Ambulatory Visit: Payer: Self-pay

## 2022-09-20 LAB — IGG, IGA, IGM
IgA: 47 mg/dL — ABNORMAL LOW (ref 61–437)
IgG (Immunoglobin G), Serum: 689 mg/dL (ref 603–1613)
IgM (Immunoglobulin M), Srm: 23 mg/dL (ref 15–143)

## 2022-09-20 LAB — KAPPA/LAMBDA LIGHT CHAINS
Kappa free light chain: 43.8 mg/L — ABNORMAL HIGH (ref 3.3–19.4)
Kappa, lambda light chain ratio: 2.81 — ABNORMAL HIGH (ref 0.26–1.65)
Lambda free light chains: 15.6 mg/L (ref 5.7–26.3)

## 2022-09-23 LAB — PROTEIN ELECTROPHORESIS, SERUM
A/G Ratio: 1.8 — ABNORMAL HIGH (ref 0.7–1.7)
Albumin ELP: 4 g/dL (ref 2.9–4.4)
Alpha-1-Globulin: 0.3 g/dL (ref 0.0–0.4)
Alpha-2-Globulin: 0.5 g/dL (ref 0.4–1.0)
Beta Globulin: 0.8 g/dL (ref 0.7–1.3)
Gamma Globulin: 0.6 g/dL (ref 0.4–1.8)
Globulin, Total: 2.2 g/dL (ref 2.2–3.9)
M-Spike, %: 0.3 g/dL — ABNORMAL HIGH
Total Protein ELP: 6.2 g/dL (ref 6.0–8.5)

## 2022-09-26 ENCOUNTER — Inpatient Hospital Stay: Payer: Medicare PPO

## 2022-09-26 VITALS — BP 113/81 | HR 56 | Temp 97.6°F | Resp 18

## 2022-09-26 DIAGNOSIS — Z5111 Encounter for antineoplastic chemotherapy: Secondary | ICD-10-CM | POA: Diagnosis not present

## 2022-09-26 DIAGNOSIS — D472 Monoclonal gammopathy: Secondary | ICD-10-CM

## 2022-09-26 LAB — CBC WITH DIFFERENTIAL (CANCER CENTER ONLY)
Abs Immature Granulocytes: 0.02 10*3/uL (ref 0.00–0.07)
Basophils Absolute: 0 10*3/uL (ref 0.0–0.1)
Basophils Relative: 0 %
Eosinophils Absolute: 0.3 10*3/uL (ref 0.0–0.5)
Eosinophils Relative: 6 %
HCT: 36.6 % — ABNORMAL LOW (ref 39.0–52.0)
Hemoglobin: 13 g/dL (ref 13.0–17.0)
Immature Granulocytes: 1 %
Lymphocytes Relative: 30 %
Lymphs Abs: 1.3 10*3/uL (ref 0.7–4.0)
MCH: 34.3 pg — ABNORMAL HIGH (ref 26.0–34.0)
MCHC: 35.5 g/dL (ref 30.0–36.0)
MCV: 96.6 fL (ref 80.0–100.0)
Monocytes Absolute: 0.4 10*3/uL (ref 0.1–1.0)
Monocytes Relative: 9 %
Neutro Abs: 2.3 10*3/uL (ref 1.7–7.7)
Neutrophils Relative %: 54 %
Platelet Count: 83 10*3/uL — ABNORMAL LOW (ref 150–400)
RBC: 3.79 MIL/uL — ABNORMAL LOW (ref 4.22–5.81)
RDW: 12.1 % (ref 11.5–15.5)
WBC Count: 4.2 10*3/uL (ref 4.0–10.5)
nRBC: 0 % (ref 0.0–0.2)

## 2022-09-26 LAB — CMP (CANCER CENTER ONLY)
ALT: 15 U/L (ref 0–44)
AST: 22 U/L (ref 15–41)
Albumin: 4.3 g/dL (ref 3.5–5.0)
Alkaline Phosphatase: 94 U/L (ref 38–126)
Anion gap: 7 (ref 5–15)
BUN: 16 mg/dL (ref 8–23)
CO2: 31 mmol/L (ref 22–32)
Calcium: 9.1 mg/dL (ref 8.9–10.3)
Chloride: 102 mmol/L (ref 98–111)
Creatinine: 1.25 mg/dL — ABNORMAL HIGH (ref 0.61–1.24)
GFR, Estimated: 57 mL/min — ABNORMAL LOW (ref >60)
Glucose, Bld: 142 mg/dL — ABNORMAL HIGH (ref 70–99)
Potassium: 4 mmol/L (ref 3.5–5.1)
Sodium: 140 mmol/L (ref 135–145)
Total Bilirubin: 0.7 mg/dL (ref 0.3–1.2)
Total Protein: 6.3 g/dL — ABNORMAL LOW (ref 6.5–8.1)

## 2022-09-26 MED ORDER — PROCHLORPERAZINE MALEATE 10 MG PO TABS
10.0000 mg | ORAL_TABLET | Freq: Once | ORAL | Status: AC
  Administered 2022-09-26: 10 mg via ORAL
  Filled 2022-09-26: qty 1

## 2022-09-26 MED ORDER — BORTEZOMIB CHEMO SQ INJECTION 3.5 MG (2.5MG/ML)
1.3000 mg/m2 | Freq: Once | INTRAMUSCULAR | Status: AC
  Administered 2022-09-26: 2.75 mg via SUBCUTANEOUS
  Filled 2022-09-26: qty 1.1

## 2022-09-26 NOTE — Patient Instructions (Signed)
[facility] [place]  Discharge Instructions: Thank you for choosing [facility] to provide your oncology and hematology care.   If you have a lab appointment with the [facility], please go directly to the [facility] and check in at the registration area.  Wear comfortable clothing and clothing appropriate for easy access to any Portacath or PICC line.   We strive to give you quality time with your provider. You may need to reschedule your appointment if you arrive late (15 or more minutes).  Arriving late affects you and other patients whose appointments are after yours.  Also, if you miss three or more appointments without notifying the office, you may be dismissed from the clinic at the provider's discretion.      For prescription refill requests, have your pharmacy contact our office and allow 72 hours for refills to be completed.    Today you received the following chemotherapy and/or immunotherapy agents Velcade.      To help prevent nausea and vomiting after your treatment, we encourage you to take your nausea medication as directed.  BELOW ARE SYMPTOMS THAT SHOULD BE REPORTED IMMEDIATELY: *FEVER GREATER THAN 100.4 F (38 C) OR HIGHER *CHILLS OR SWEATING *NAUSEA AND VOMITING THAT IS NOT CONTROLLED WITH YOUR NAUSEA MEDICATION *UNUSUAL SHORTNESS OF BREATH *UNUSUAL BRUISING OR BLEEDING *URINARY PROBLEMS (pain or burning when urinating, or frequent urination) *BOWEL PROBLEMS (unusual diarrhea, constipation, pain near the anus) TENDERNESS IN MOUTH AND THROAT WITH OR WITHOUT PRESENCE OF ULCERS (sore throat, sores in mouth, or a toothache) UNUSUAL RASH, SWELLING OR PAIN  UNUSUAL VAGINAL DISCHARGE OR ITCHING   Items with * indicate a potential emergency and should be followed up as soon as possible or go to the Emergency Department if any problems should occur.  Please show the CHEMOTHERAPY ALERT CARD or IMMUNOTHERAPY ALERT CARD at check-in  to the Emergency Department and triage nurse. Should you have questions after your visit or need to cancel or reschedule your appointment, please contact [facility]  [phone] and follow the prompts.  Office hours are 8:00 a.m. to 4:30 p.m. Monday - Friday. Please note that voicemails left after 4:00 p.m. may not be returned until the following business day.  We are closed weekends and major holidays. You have access to a nurse at all times for urgent questions. Please call the main number to the clinic [phone] and follow the prompts.  For any non-urgent questions, you may also contact your provider using MyChart. We now offer e-Visits for anyone 98 and older to request care online for non-urgent symptoms. For details visit [email or web address].   Also download the MyChart app! Go to the app store, search "MyChart", open the app, select [facility], and log in with your MyChart username and password.

## 2022-09-26 NOTE — Progress Notes (Signed)
OK to treat with today's platelets value per Dr. Ennever. 

## 2022-10-07 ENCOUNTER — Other Ambulatory Visit: Payer: Self-pay

## 2022-10-17 ENCOUNTER — Inpatient Hospital Stay: Payer: Medicare PPO | Attending: Hematology & Oncology

## 2022-10-17 ENCOUNTER — Encounter: Payer: Self-pay | Admitting: Medical Oncology

## 2022-10-17 ENCOUNTER — Inpatient Hospital Stay (HOSPITAL_BASED_OUTPATIENT_CLINIC_OR_DEPARTMENT_OTHER): Payer: Medicare PPO | Admitting: Medical Oncology

## 2022-10-17 ENCOUNTER — Inpatient Hospital Stay: Payer: Medicare PPO

## 2022-10-17 ENCOUNTER — Other Ambulatory Visit: Payer: Self-pay

## 2022-10-17 DIAGNOSIS — Z5111 Encounter for antineoplastic chemotherapy: Secondary | ICD-10-CM | POA: Diagnosis present

## 2022-10-17 DIAGNOSIS — Z5112 Encounter for antineoplastic immunotherapy: Secondary | ICD-10-CM | POA: Diagnosis not present

## 2022-10-17 DIAGNOSIS — C9000 Multiple myeloma not having achieved remission: Secondary | ICD-10-CM | POA: Insufficient documentation

## 2022-10-17 DIAGNOSIS — D472 Monoclonal gammopathy: Secondary | ICD-10-CM

## 2022-10-17 LAB — CMP (CANCER CENTER ONLY)
ALT: 12 U/L (ref 0–44)
AST: 23 U/L (ref 15–41)
Albumin: 4.4 g/dL (ref 3.5–5.0)
Alkaline Phosphatase: 90 U/L (ref 38–126)
Anion gap: 7 (ref 5–15)
BUN: 24 mg/dL — ABNORMAL HIGH (ref 8–23)
CO2: 31 mmol/L (ref 22–32)
Calcium: 9.1 mg/dL (ref 8.9–10.3)
Chloride: 102 mmol/L (ref 98–111)
Creatinine: 1.16 mg/dL (ref 0.61–1.24)
GFR, Estimated: >60 mL/min (ref >60)
Glucose, Bld: 150 mg/dL — ABNORMAL HIGH (ref 70–99)
Potassium: 4.2 mmol/L (ref 3.5–5.1)
Sodium: 140 mmol/L (ref 135–145)
Total Bilirubin: 1 mg/dL (ref 0.3–1.2)
Total Protein: 6.4 g/dL — ABNORMAL LOW (ref 6.5–8.1)

## 2022-10-17 LAB — CBC WITH DIFFERENTIAL (CANCER CENTER ONLY)
Abs Immature Granulocytes: 0.03 10*3/uL (ref 0.00–0.07)
Basophils Absolute: 0 10*3/uL (ref 0.0–0.1)
Basophils Relative: 0 %
Eosinophils Absolute: 0.1 10*3/uL (ref 0.0–0.5)
Eosinophils Relative: 3 %
HCT: 36.1 % — ABNORMAL LOW (ref 39.0–52.0)
Hemoglobin: 12.7 g/dL — ABNORMAL LOW (ref 13.0–17.0)
Immature Granulocytes: 1 %
Lymphocytes Relative: 32 %
Lymphs Abs: 1.3 10*3/uL (ref 0.7–4.0)
MCH: 33.7 pg (ref 26.0–34.0)
MCHC: 35.2 g/dL (ref 30.0–36.0)
MCV: 95.8 fL (ref 80.0–100.0)
Monocytes Absolute: 0.3 10*3/uL (ref 0.1–1.0)
Monocytes Relative: 8 %
Neutro Abs: 2.3 10*3/uL (ref 1.7–7.7)
Neutrophils Relative %: 56 %
Platelet Count: 77 10*3/uL — ABNORMAL LOW (ref 150–400)
RBC: 3.77 MIL/uL — ABNORMAL LOW (ref 4.22–5.81)
RDW: 12.3 % (ref 11.5–15.5)
WBC Count: 4.1 10*3/uL (ref 4.0–10.5)
nRBC: 0 % (ref 0.0–0.2)

## 2022-10-17 MED ORDER — ACETAMINOPHEN 325 MG PO TABS
650.0000 mg | ORAL_TABLET | Freq: Once | ORAL | Status: AC
  Administered 2022-10-17: 650 mg via ORAL
  Filled 2022-10-17: qty 2

## 2022-10-17 MED ORDER — DARATUMUMAB-HYALURONIDASE-FIHJ 1800-30000 MG-UT/15ML ~~LOC~~ SOLN
1800.0000 mg | Freq: Once | SUBCUTANEOUS | Status: AC
  Administered 2022-10-17: 1800 mg via SUBCUTANEOUS
  Filled 2022-10-17: qty 15

## 2022-10-17 MED ORDER — BORTEZOMIB CHEMO SQ INJECTION 3.5 MG (2.5MG/ML)
1.3000 mg/m2 | Freq: Once | INTRAMUSCULAR | Status: AC
  Administered 2022-10-17: 2.75 mg via SUBCUTANEOUS
  Filled 2022-10-17: qty 1.1

## 2022-10-17 MED ORDER — DIPHENHYDRAMINE HCL 25 MG PO CAPS
25.0000 mg | ORAL_CAPSULE | Freq: Once | ORAL | Status: AC
  Administered 2022-10-17: 25 mg via ORAL
  Filled 2022-10-17: qty 1

## 2022-10-17 MED ORDER — DEXAMETHASONE 4 MG PO TABS
20.0000 mg | ORAL_TABLET | Freq: Once | ORAL | Status: AC
  Administered 2022-10-17: 20 mg via ORAL
  Filled 2022-10-17: qty 5

## 2022-10-17 NOTE — Patient Instructions (Signed)
[facility] [place]  Discharge Instructions: Thank you for choosing [facility] to provide your oncology and hematology care.   If you have a lab appointment with the [facility], please go directly to the [facility] and check in at the registration area.  Wear comfortable clothing and clothing appropriate for easy access to any Portacath or PICC line.   We strive to give you quality time with your provider. You may need to reschedule your appointment if you arrive late (15 or more minutes).  Arriving late affects you and other patients whose appointments are after yours.  Also, if you miss three or more appointments without notifying the office, you may be dismissed from the clinic at the provider's discretion.      For prescription refill requests, have your pharmacy contact our office and allow 72 hours for refills to be completed.    Today you received the following chemotherapy and/or immunotherapy agents Darzalex/Velcade       To help prevent nausea and vomiting after your treatment, we encourage you to take your nausea medication as directed.  BELOW ARE SYMPTOMS THAT SHOULD BE REPORTED IMMEDIATELY: *FEVER GREATER THAN 100.4 F (38 C) OR HIGHER *CHILLS OR SWEATING *NAUSEA AND VOMITING THAT IS NOT CONTROLLED WITH YOUR NAUSEA MEDICATION *UNUSUAL SHORTNESS OF BREATH *UNUSUAL BRUISING OR BLEEDING *URINARY PROBLEMS (pain or burning when urinating, or frequent urination) *BOWEL PROBLEMS (unusual diarrhea, constipation, pain near the anus) TENDERNESS IN MOUTH AND THROAT WITH OR WITHOUT PRESENCE OF ULCERS (sore throat, sores in mouth, or a toothache) UNUSUAL RASH, SWELLING OR PAIN  UNUSUAL VAGINAL DISCHARGE OR ITCHING   Items with * indicate a potential emergency and should be followed up as soon as possible or go to the Emergency Department if any problems should occur.  Please show the CHEMOTHERAPY ALERT CARD or IMMUNOTHERAPY ALERT CARD at  check-in to the Emergency Department and triage nurse. Should you have questions after your visit or need to cancel or reschedule your appointment, please contact [facility]  [phone] and follow the prompts.  Office hours are 8:00 a.m. to 4:30 p.m. Monday - Friday. Please note that voicemails left after 4:00 p.m. may not be returned until the following business day.  We are closed weekends and major holidays. You have access to a nurse at all times for urgent questions. Please call the main number to the clinic [phone] and follow the prompts.  For any non-urgent questions, you may also contact your provider using MyChart. We now offer e-Visits for anyone 39 and older to request care online for non-urgent symptoms. For details visit [email or web address].   Also download the MyChart app! Go to the app store, search "MyChart", open the app, select [facility], and log in with your MyChart username and password.

## 2022-10-17 NOTE — Progress Notes (Signed)
Hematology and Oncology Follow Up Visit  Riley Eaton [id number] October 05, 1940 82 y.o. 10/17/2022   Principle Diagnosis:  IgG Kappa myeloma  -- +1q and t(11:14)   Current Therapy:        Velcade/Decadron -- ( 3 wk on/1 wk off) -- s/p cycle #9 -- start on 08/24/2021 Faspro - s/p cycle 6 -  started on 04/30/2022   Interim History:  Riley Eaton is here today for follow-up and treatment.   He reports that he is doing well and feeling well. No concerns today. Has follow up with his cardiologist in June. He denies any fatigue, dizziness, weakness.   In Feb, M-spike was 0.3 g/dL, IgG level 689 mg/dL and kappa light chains 4.38 mg/dL.   No fever, chills, n/v, cough, rash, dizziness, SOB, chest pain, palpitations, abdominal pain or changes in bowel or bladder habits.  No blood loss noted. No bruising or petechiae.  No swelling in his extremities.  No falls or syncope.  Appetite and hydration are good.   Wt Readings from Last 3 Encounters:  10/17/22 194 lb (88 kg)  08/21/22 193 lb 12.8 oz (87.9 kg)  07/23/22 193 lb 1.3 oz (87.6 kg)     ECOG Performance Status: 1 - Symptomatic but completely ambulatory  Medications:  Allergies as of 10/17/2022   No Known Allergies      Medication List        Accurate as of October 17, 2022 10:29 AM. If you have any questions, ask your nurse or doctor.          acetaminophen 325 MG tablet Commonly known as: TYLENOL Take 2 tablets (650 mg total) by mouth every 6 (six) hours as needed for mild pain (or temp > 100).   allopurinol 100 MG tablet Commonly known as: ZYLOPRIM TAKE 1 TABLET BY MOUTH EVERY DAY   aspirin 81 MG chewable tablet Chew 1 tablet (81 mg total) by mouth daily.   atorvastatin 40 MG tablet Commonly known as: LIPITOR Take 1 tablet (40 mg total) by mouth daily at 6 PM. What changed: when to take this   carvedilol 6.25 MG tablet Commonly known as: COREG Take 1 tablet by mouth 2 (two) times daily.   colchicine 0.6 MG  tablet   cyanocobalamin 500 MCG tablet Commonly known as: VITAMIN B12 Take 500 mcg by mouth daily.   diphenhydramine-acetaminophen 25-500 MG Tabs tablet Commonly known as: TYLENOL PM Take 1 tablet by mouth at bedtime.   finasteride 5 MG tablet Commonly known as: PROSCAR Take 1 tablet (5 mg total) by mouth daily. For urination   furosemide 40 MG tablet Commonly known as: LASIX TAKE 1 TABLET BY MOUTH EVERY DAY   HYDROcodone-acetaminophen 7.5-325 MG tablet Commonly known as: NORCO Take 1 tablet by mouth 4 (four) times daily as needed for moderate pain.   LORazepam 0.5 MG tablet Commonly known as: ATIVAN TAKE 1 TABLET (0.5 MG TOTAL) BY MOUTH EVERY 6 (SIX) HOURS AS NEEDED (NAUSEA OR VOMITING).   losartan 25 MG tablet Commonly known as: COZAAR Take 25 mg by mouth daily.   Movantik 25 MG Tabs tablet Generic drug: naloxegol oxalate Take 25 mg by mouth daily.   OLANZapine 5 MG tablet Commonly known as: ZYPREXA Take 1 tablet (5 mg total) by mouth daily.   ondansetron 4 MG disintegrating tablet Commonly known as: Zofran ODT Take 1 tablet (4 mg total) by mouth every 8 (eight) hours as needed for nausea or vomiting.   pantoprazole 40 MG tablet Commonly  known as: PROTONIX Take 1 tablet (40 mg total) by mouth at bedtime. What changed: when to take this   polyethylene glycol 17 g packet Commonly known as: MIRALAX / GLYCOLAX Take 17 g by mouth daily. What changed:  when to take this reasons to take this   pregabalin 150 MG capsule Commonly known as: LYRICA Take 150 mg by mouth 2 (two) times daily.   traZODone 50 MG tablet Commonly known as: DESYREL Take 50 mg by mouth at bedtime.        Allergies: No Known Allergies  Past Medical History, Surgical history, Social history, and Family History were reviewed and updated.  Review of Systems: All other 10 point review of systems is negative.   Physical Exam:  height is '5\' 7"'$  (1.702 m) and weight is 194 lb (88 kg).  His oral temperature is 98.2 F (36.8 C). His blood pressure is 103/58 (abnormal) and his pulse is 58 (abnormal). His respiration is 20 and oxygen saturation is 95%.   Wt Readings from Last 3 Encounters:  10/17/22 194 lb (88 kg)  08/21/22 193 lb 12.8 oz (87.9 kg)  07/23/22 193 lb 1.3 oz (87.6 kg)   General: Using rolling walker well. Kyphotic  Ocular: Sclerae unicteric, pupils equal, round and reactive to light Ear-nose-throat: Oropharynx clear, dentition fair Lymphatic: No cervical or supraclavicular adenopathy Lungs no rales or rhonchi, good excursion bilaterally Heart regular rate and rhythm, no murmur appreciated Abd soft, nontender MSK no focal spinal tenderness, no joint edema Neuro: non-focal, well-oriented, appropriate affect  Lab Results  Component Value Date   WBC 4.1 10/17/2022   HGB 12.7 (L) 10/17/2022   HCT 36.1 (L) 10/17/2022   MCV 95.8 10/17/2022   PLT 77 (L) 10/17/2022   No results found for: "FERRITIN", "IRON", "TIBC", "UIBC", "IRONPCTSAT" Lab Results  Component Value Date   RBC 3.77 (L) 10/17/2022   Lab Results  Component Value Date   KPAFRELGTCHN 43.8 (H) 09/19/2022   LAMBDASER 15.6 09/19/2022   KAPLAMBRATIO 2.81 (H) 09/19/2022   Lab Results  Component Value Date   IGGSERUM 689 09/19/2022   IGA 47 (L) 09/19/2022   IGMSERUM 23 09/19/2022   Lab Results  Component Value Date   TOTALPROTELP 6.2 09/19/2022   ALBUMINELP 4.0 09/19/2022   A1GS 0.3 09/19/2022   A2GS 0.5 09/19/2022   BETS 0.8 09/19/2022   GAMS 0.6 09/19/2022   MSPIKE 0.3 (H) 09/19/2022   SPEI Comment 09/19/2022     Chemistry      Component Value Date/Time   NA 140 09/26/2022 1105   NA 142 10/15/2019 1500   K 4.0 09/26/2022 1105   CL 102 09/26/2022 1105   CO2 31 09/26/2022 1105   BUN 16 09/26/2022 1105   BUN 25 10/15/2019 1500   CREATININE 1.25 (H) 09/26/2022 1105      Component Value Date/Time   CALCIUM 9.1 09/26/2022 1105   ALKPHOS 94 09/26/2022 1105   AST 22 09/26/2022  1105   ALT 15 09/26/2022 1105   BILITOT 0.7 09/26/2022 1105       Impression and Plan: Riley Eaton is a very pleasant 82 yo caucasian gentleman with IgG Kappa myeloma.   Currently on once monthly Faspro which he is tolerating well. CBC and CMP reviewed and stable. BP/pulse a bit soft but he reports being asymptomatic. We discussed that should he have any symptoms to see his PCP or his cardiologist sooner than June. He reports being asymptomatic in terms of side effects from his  myeloma medications. 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   We will proceed with treatment today as planned.  Follow-up in 1 month MD, labs + Max Sane, [place] 3/7/202410:29 AM

## 2022-10-17 NOTE — Progress Notes (Signed)
Okay to treat with pltc 77 per Nelwyn Salisbury, NP.

## 2022-11-07 NOTE — Progress Notes (Signed)
Deleting cycle 7 day 8 per Dr. Antonieta Pert instructions. Patient will receive cycle 8 day 1 on 11/14/22, has appt scheduled.

## 2022-11-12 ENCOUNTER — Other Ambulatory Visit: Payer: Self-pay

## 2022-11-14 ENCOUNTER — Inpatient Hospital Stay: Payer: Medicare PPO | Attending: Hematology & Oncology

## 2022-11-14 ENCOUNTER — Inpatient Hospital Stay: Payer: Medicare PPO

## 2022-11-14 VITALS — BP 126/70 | HR 53 | Temp 97.5°F | Resp 17

## 2022-11-14 DIAGNOSIS — C9000 Multiple myeloma not having achieved remission: Secondary | ICD-10-CM | POA: Diagnosis not present

## 2022-11-14 DIAGNOSIS — Z5111 Encounter for antineoplastic chemotherapy: Secondary | ICD-10-CM | POA: Insufficient documentation

## 2022-11-14 DIAGNOSIS — Z5112 Encounter for antineoplastic immunotherapy: Secondary | ICD-10-CM | POA: Insufficient documentation

## 2022-11-14 DIAGNOSIS — D472 Monoclonal gammopathy: Secondary | ICD-10-CM

## 2022-11-14 LAB — CBC WITH DIFFERENTIAL (CANCER CENTER ONLY)
Abs Immature Granulocytes: 0.01 10*3/uL (ref 0.00–0.07)
Basophils Absolute: 0 10*3/uL (ref 0.0–0.1)
Basophils Relative: 0 %
Eosinophils Absolute: 0.1 10*3/uL (ref 0.0–0.5)
Eosinophils Relative: 2 %
HCT: 35.2 % — ABNORMAL LOW (ref 39.0–52.0)
Hemoglobin: 12.5 g/dL — ABNORMAL LOW (ref 13.0–17.0)
Immature Granulocytes: 0 %
Lymphocytes Relative: 25 %
Lymphs Abs: 0.7 10*3/uL (ref 0.7–4.0)
MCH: 34 pg (ref 26.0–34.0)
MCHC: 35.5 g/dL (ref 30.0–36.0)
MCV: 95.7 fL (ref 80.0–100.0)
Monocytes Absolute: 0.3 10*3/uL (ref 0.1–1.0)
Monocytes Relative: 9 %
Neutro Abs: 1.9 10*3/uL (ref 1.7–7.7)
Neutrophils Relative %: 64 %
Platelet Count: 75 10*3/uL — ABNORMAL LOW (ref 150–400)
RBC: 3.68 MIL/uL — ABNORMAL LOW (ref 4.22–5.81)
RDW: 12.6 % (ref 11.5–15.5)
WBC Count: 2.9 10*3/uL — ABNORMAL LOW (ref 4.0–10.5)
nRBC: 0 % (ref 0.0–0.2)

## 2022-11-14 LAB — CMP (CANCER CENTER ONLY)
ALT: 16 U/L (ref 0–44)
AST: 29 U/L (ref 15–41)
Albumin: 4 g/dL (ref 3.5–5.0)
Alkaline Phosphatase: 87 U/L (ref 38–126)
Anion gap: 9 (ref 5–15)
BUN: 20 mg/dL (ref 8–23)
CO2: 28 mmol/L (ref 22–32)
Calcium: 8.5 mg/dL — ABNORMAL LOW (ref 8.9–10.3)
Chloride: 100 mmol/L (ref 98–111)
Creatinine: 1.03 mg/dL (ref 0.61–1.24)
GFR, Estimated: >60 mL/min (ref >60)
Glucose, Bld: 129 mg/dL — ABNORMAL HIGH (ref 70–99)
Potassium: 4 mmol/L (ref 3.5–5.1)
Sodium: 137 mmol/L (ref 135–145)
Total Bilirubin: 1 mg/dL (ref 0.3–1.2)
Total Protein: 6.4 g/dL — ABNORMAL LOW (ref 6.5–8.1)

## 2022-11-14 LAB — LACTATE DEHYDROGENASE: LDH: 395 U/L — ABNORMAL HIGH (ref 98–192)

## 2022-11-14 MED ORDER — DEXAMETHASONE 4 MG PO TABS
20.0000 mg | ORAL_TABLET | Freq: Once | ORAL | Status: AC
  Administered 2022-11-14: 20 mg via ORAL
  Filled 2022-11-14: qty 5

## 2022-11-14 MED ORDER — ACETAMINOPHEN 325 MG PO TABS
650.0000 mg | ORAL_TABLET | Freq: Once | ORAL | Status: AC
  Administered 2022-11-14: 650 mg via ORAL
  Filled 2022-11-14: qty 2

## 2022-11-14 MED ORDER — DIPHENHYDRAMINE HCL 25 MG PO CAPS
25.0000 mg | ORAL_CAPSULE | Freq: Once | ORAL | Status: AC
  Administered 2022-11-14: 25 mg via ORAL
  Filled 2022-11-14: qty 1

## 2022-11-14 MED ORDER — DARATUMUMAB-HYALURONIDASE-FIHJ 1800-30000 MG-UT/15ML ~~LOC~~ SOLN
1800.0000 mg | Freq: Once | SUBCUTANEOUS | Status: AC
  Administered 2022-11-14: 1800 mg via SUBCUTANEOUS
  Filled 2022-11-14: qty 15

## 2022-11-14 NOTE — Progress Notes (Signed)
Reviewed pt labs with Dr. Marin Olp and pt to receive Faspro only today; no velcade due to platelets 75

## 2022-11-14 NOTE — Patient Instructions (Signed)
[facility]  Discharge Instructions: Thank you for choosing [facility] to provide your oncology and hematology care.   If you have a lab appointment with the Cancer Center, please go directly to the Cancer Center and check in at the registration area.  Wear comfortable clothing and clothing appropriate for easy access to any Portacath or PICC line.   We strive to give you quality time with your provider. You may need to reschedule your appointment if you arrive late (15 or more minutes).  Arriving late affects you and other patients whose appointments are after yours.  Also, if you miss three or more appointments without notifying the office, you may be dismissed from the clinic at the provider's discretion.      For prescription refill requests, have your pharmacy contact our office and allow 72 hours for refills to be completed.    Today you received the following chemotherapy and/or immunotherapy agents Darzalex/Velcade       To help prevent nausea and vomiting after your treatment, we encourage you to take your nausea medication as directed.  BELOW ARE SYMPTOMS THAT SHOULD BE REPORTED IMMEDIATELY: *FEVER GREATER THAN 100.4 F (38 C) OR HIGHER *CHILLS OR SWEATING *NAUSEA AND VOMITING THAT IS NOT CONTROLLED WITH YOUR NAUSEA MEDICATION *UNUSUAL SHORTNESS OF BREATH *UNUSUAL BRUISING OR BLEEDING *URINARY PROBLEMS (pain or burning when urinating, or frequent urination) *BOWEL PROBLEMS (unusual diarrhea, constipation, pain near the anus) TENDERNESS IN MOUTH AND THROAT WITH OR WITHOUT PRESENCE OF ULCERS (sore throat, sores in mouth, or a toothache) UNUSUAL RASH, SWELLING OR PAIN  UNUSUAL VAGINAL DISCHARGE OR ITCHING   Items with * indicate a potential emergency and should be followed up as soon as possible or go to the Emergency Department if any problems should occur.  Please show the CHEMOTHERAPY ALERT CARD or IMMUNOTHERAPY ALERT CARD at  check-in to the Emergency Department and triage nurse. Should you have questions after your visit or need to cancel or reschedule your appointment, please contact [facility]  [phone] and follow the prompts.  Office hours are 8:00 a.m. to 4:30 p.m. Monday - Friday. Please note that voicemails left after 4:00 p.m. may not be returned until the following business day.  We are closed weekends and major holidays. You have access to a nurse at all times for urgent questions. Please call the main number to the clinic [phone] and follow the prompts.  For any non-urgent questions, you may also contact your provider using MyChart. We now offer e-Visits for anyone 18 and older to request care online for non-urgent symptoms. For details visit [email or web address].   Also download the MyChart app! Go to the app store, search "MyChart", open the app, select [facility], and log in with your MyChart username and password.   

## 2022-11-15 LAB — KAPPA/LAMBDA LIGHT CHAINS
Kappa free light chain: 35.4 mg/L — ABNORMAL HIGH (ref 3.3–19.4)
Kappa, lambda light chain ratio: 2.25 — ABNORMAL HIGH (ref 0.26–1.65)
Lambda free light chains: 15.7 mg/L (ref 5.7–26.3)

## 2022-11-18 LAB — MULTIPLE MYELOMA PANEL, SERUM
Albumin SerPl Elph-Mcnc: 4.1 g/dL (ref 2.9–4.4)
Albumin/Glob SerPl: 2.3 — ABNORMAL HIGH (ref 0.7–1.7)
Alpha 1: 0.2 g/dL (ref 0.0–0.4)
Alpha2 Glob SerPl Elph-Mcnc: 0.4 g/dL (ref 0.4–1.0)
B-Globulin SerPl Elph-Mcnc: 0.6 g/dL — ABNORMAL LOW (ref 0.7–1.3)
Gamma Glob SerPl Elph-Mcnc: 0.6 g/dL (ref 0.4–1.8)
Globulin, Total: 1.8 g/dL — ABNORMAL LOW (ref 2.2–3.9)
IgA: 41 mg/dL — ABNORMAL LOW (ref 61–437)
IgG (Immunoglobin G), Serum: 666 mg/dL (ref 603–1613)
IgM (Immunoglobulin M), Srm: 20 mg/dL (ref 15–143)
M Protein SerPl Elph-Mcnc: 0.3 g/dL — ABNORMAL HIGH
Total Protein ELP: 5.9 g/dL — ABNORMAL LOW (ref 6.0–8.5)

## 2022-11-19 ENCOUNTER — Other Ambulatory Visit: Payer: Self-pay

## 2022-11-20 ENCOUNTER — Inpatient Hospital Stay: Payer: Medicare PPO

## 2022-11-20 ENCOUNTER — Encounter: Payer: Self-pay | Admitting: Family

## 2022-11-20 ENCOUNTER — Inpatient Hospital Stay: Payer: Medicare PPO | Admitting: Family

## 2022-11-20 VITALS — BP 116/77 | HR 52 | Temp 97.6°F | Resp 18 | Wt 193.0 lb

## 2022-11-20 DIAGNOSIS — D472 Monoclonal gammopathy: Secondary | ICD-10-CM

## 2022-11-20 DIAGNOSIS — Z5111 Encounter for antineoplastic chemotherapy: Secondary | ICD-10-CM | POA: Diagnosis not present

## 2022-11-20 LAB — CMP (CANCER CENTER ONLY)
ALT: 15 U/L (ref 0–44)
AST: 20 U/L (ref 15–41)
Albumin: 4.2 g/dL (ref 3.5–5.0)
Alkaline Phosphatase: 91 U/L (ref 38–126)
Anion gap: 6 (ref 5–15)
BUN: 21 mg/dL (ref 8–23)
CO2: 31 mmol/L (ref 22–32)
Calcium: 8.8 mg/dL — ABNORMAL LOW (ref 8.9–10.3)
Chloride: 102 mmol/L (ref 98–111)
Creatinine: 1.04 mg/dL (ref 0.61–1.24)
GFR, Estimated: >60 mL/min (ref >60)
Glucose, Bld: 137 mg/dL — ABNORMAL HIGH (ref 70–99)
Potassium: 4 mmol/L (ref 3.5–5.1)
Sodium: 139 mmol/L (ref 135–145)
Total Bilirubin: 0.9 mg/dL (ref 0.3–1.2)
Total Protein: 6.2 g/dL — ABNORMAL LOW (ref 6.5–8.1)

## 2022-11-20 LAB — CBC WITH DIFFERENTIAL (CANCER CENTER ONLY)
Abs Immature Granulocytes: 0.02 10*3/uL (ref 0.00–0.07)
Basophils Absolute: 0 10*3/uL (ref 0.0–0.1)
Basophils Relative: 0 %
Eosinophils Absolute: 0.1 10*3/uL (ref 0.0–0.5)
Eosinophils Relative: 2 %
HCT: 36.4 % — ABNORMAL LOW (ref 39.0–52.0)
Hemoglobin: 12.8 g/dL — ABNORMAL LOW (ref 13.0–17.0)
Immature Granulocytes: 1 %
Lymphocytes Relative: 28 %
Lymphs Abs: 1 10*3/uL (ref 0.7–4.0)
MCH: 33.9 pg (ref 26.0–34.0)
MCHC: 35.2 g/dL (ref 30.0–36.0)
MCV: 96.3 fL (ref 80.0–100.0)
Monocytes Absolute: 0.3 10*3/uL (ref 0.1–1.0)
Monocytes Relative: 8 %
Neutro Abs: 2.2 10*3/uL (ref 1.7–7.7)
Neutrophils Relative %: 61 %
Platelet Count: 84 10*3/uL — ABNORMAL LOW (ref 150–400)
RBC: 3.78 MIL/uL — ABNORMAL LOW (ref 4.22–5.81)
RDW: 12.8 % (ref 11.5–15.5)
WBC Count: 3.6 10*3/uL — ABNORMAL LOW (ref 4.0–10.5)
nRBC: 0 % (ref 0.0–0.2)

## 2022-11-20 MED ORDER — PROCHLORPERAZINE MALEATE 10 MG PO TABS
10.0000 mg | ORAL_TABLET | Freq: Once | ORAL | Status: AC
  Administered 2022-11-20: 10 mg via ORAL
  Filled 2022-11-20: qty 1

## 2022-11-20 MED ORDER — BORTEZOMIB CHEMO SQ INJECTION 3.5 MG (2.5MG/ML)
1.3000 mg/m2 | Freq: Once | INTRAMUSCULAR | Status: AC
  Administered 2022-11-20: 2.75 mg via SUBCUTANEOUS
  Filled 2022-11-20: qty 1.1

## 2022-11-20 NOTE — Patient Instructions (Signed)
[facility]  Discharge Instructions: Thank you for choosing [facility] to provide your oncology and hematology care.   If you have a lab appointment with the Cancer Center, please go directly to the Cancer Center and check in at the registration area.  Wear comfortable clothing and clothing appropriate for easy access to any Portacath or PICC line.   We strive to give you quality time with your provider. You may need to reschedule your appointment if you arrive late (15 or more minutes).  Arriving late affects you and other patients whose appointments are after yours.  Also, if you miss three or more appointments without notifying the office, you may be dismissed from the clinic at the provider's discretion.      For prescription refill requests, have your pharmacy contact our office and allow 72 hours for refills to be completed.    Today you received the following chemotherapy and/or immunotherapy agents Velcade.      To help prevent nausea and vomiting after your treatment, we encourage you to take your nausea medication as directed.  BELOW ARE SYMPTOMS THAT SHOULD BE REPORTED IMMEDIATELY: *FEVER GREATER THAN 100.4 F (38 C) OR HIGHER *CHILLS OR SWEATING *NAUSEA AND VOMITING THAT IS NOT CONTROLLED WITH YOUR NAUSEA MEDICATION *UNUSUAL SHORTNESS OF BREATH *UNUSUAL BRUISING OR BLEEDING *URINARY PROBLEMS (pain or burning when urinating, or frequent urination) *BOWEL PROBLEMS (unusual diarrhea, constipation, pain near the anus) TENDERNESS IN MOUTH AND THROAT WITH OR WITHOUT PRESENCE OF ULCERS (sore throat, sores in mouth, or a toothache) UNUSUAL RASH, SWELLING OR PAIN  UNUSUAL VAGINAL DISCHARGE OR ITCHING   Items with * indicate a potential emergency and should be followed up as soon as possible or go to the Emergency Department if any problems should occur.  Please show the CHEMOTHERAPY ALERT CARD or IMMUNOTHERAPY ALERT CARD at check-in  to the Emergency Department and triage nurse. Should you have questions after your visit or need to cancel or reschedule your appointment, please contact [facility]  [phone] and follow the prompts.  Office hours are 8:00 a.m. to 4:30 p.m. Monday - Friday. Please note that voicemails left after 4:00 p.m. may not be returned until the following business day.  We are closed weekends and major holidays. You have access to a nurse at all times for urgent questions. Please call the main number to the clinic [phone] and follow the prompts.  For any non-urgent questions, you may also contact your provider using MyChart. We now offer e-Visits for anyone 18 and older to request care online for non-urgent symptoms. For details visit [email or web address].   Also download the MyChart app! Go to the app store, search "MyChart", open the app, select [place], and log in with your MyChart username and password.   

## 2022-11-20 NOTE — Progress Notes (Signed)
Hematology and Oncology Follow Up Visit  Riley Eaton [id number] 08/31/1940 82 y.o. 11/20/2022   Principle Diagnosis:  IgG Kappa myeloma  -- +1q and t(11:14)   Current Therapy:        Velcade/Decadron -- ( 3 wk on/1 wk off) -- s/p cycle #9 -- start on 08/24/2021 Faspro - s/p cycle 6 -  started on 04/30/2022   Interim History:  Riley Eaton is here today for follow-up and treatment. He is doing fairly well and has no new complaints at this time. He notes fatigue.  Last week his M-spike was 0.3, IgG level 666 mg/dL and kappa light chains 3.54 mg/dL.  No issue with infections. No fever, chills, n/v, cough, rash, dizziness, chest pain, palpitations, abdominal pain or changes in bowel or bladder habits.  Mild SOB with over exertion. He rests throughout the day as needed.  No swelling, tenderness, numbness or tingling in his extremities.  No falls or syncope reported.  He ambulates with a Rolator for added support.  Appetite and hydration are good. Weight is 193 lbs.   ECOG Performance Status: 1 - Symptomatic but completely ambulatory  Medications:  Allergies as of 11/20/2022   No Known Allergies      Medication List        Accurate as of November 20, 2022 10:44 AM. If you have any questions, ask your nurse or doctor.          acetaminophen 325 MG tablet Commonly known as: TYLENOL Take 2 tablets (650 mg total) by mouth every 6 (six) hours as needed for mild pain (or temp > 100).   allopurinol 100 MG tablet Commonly known as: ZYLOPRIM TAKE 1 TABLET BY MOUTH EVERY DAY   aspirin 81 MG chewable tablet Chew 1 tablet (81 mg total) by mouth daily.   atorvastatin 40 MG tablet Commonly known as: LIPITOR Take 1 tablet (40 mg total) by mouth daily at 6 PM. What changed: when to take this   carvedilol 6.25 MG tablet Commonly known as: COREG Take 1 tablet by mouth 2 (two) times daily.   colchicine 0.6 MG tablet   cyanocobalamin 500 MCG tablet Commonly known as: VITAMIN  B12 Take 500 mcg by mouth daily.   diphenhydramine-acetaminophen 25-500 MG Tabs tablet Commonly known as: TYLENOL PM Take 1 tablet by mouth at bedtime.   finasteride 5 MG tablet Commonly known as: PROSCAR Take 1 tablet (5 mg total) by mouth daily. For urination   furosemide 40 MG tablet Commonly known as: LASIX TAKE 1 TABLET BY MOUTH EVERY DAY   HYDROcodone-acetaminophen 7.5-325 MG tablet Commonly known as: NORCO Take 1 tablet by mouth 4 (four) times daily as needed for moderate pain.   LORazepam 0.5 MG tablet Commonly known as: ATIVAN TAKE 1 TABLET (0.5 MG TOTAL) BY MOUTH EVERY 6 (SIX) HOURS AS NEEDED (NAUSEA OR VOMITING).   losartan 25 MG tablet Commonly known as: COZAAR Take 25 mg by mouth daily.   Movantik 25 MG Tabs tablet Generic drug: naloxegol oxalate Take 25 mg by mouth daily.   OLANZapine 5 MG tablet Commonly known as: ZYPREXA Take 1 tablet (5 mg total) by mouth daily.   ondansetron 4 MG disintegrating tablet Commonly known as: Zofran ODT Take 1 tablet (4 mg total) by mouth every 8 (eight) hours as needed for nausea or vomiting.   pantoprazole 40 MG tablet Commonly known as: PROTONIX Take 1 tablet (40 mg total) by mouth at bedtime. What changed: when to take this   polyethylene  glycol 17 g packet Commonly known as: MIRALAX / GLYCOLAX Take 17 g by mouth daily. What changed:  when to take this reasons to take this   pregabalin 150 MG capsule Commonly known as: LYRICA Take 150 mg by mouth 2 (two) times daily.   traZODone 50 MG tablet Commonly known as: DESYREL Take 50 mg by mouth at bedtime.        Allergies: No Known Allergies  Past Medical History, Surgical history, Social history, and Family History were reviewed and updated.  Review of Systems: All other 10 point review of systems is negative.   Physical Exam:  vitals were not taken for this visit.   Wt Readings from Last 3 Encounters:  10/17/22 194 lb (88 kg)  08/21/22 193 lb 12.8  oz (87.9 kg)  07/23/22 193 lb 1.3 oz (87.6 kg)    Ocular: Sclerae unicteric, pupils equal, round and reactive to light Ear-nose-throat: Oropharynx clear, dentition fair Lymphatic: No cervical or supraclavicular adenopathy Lungs no rales or rhonchi, good excursion bilaterally Heart regular rate and rhythm, no murmur appreciated Abd soft, nontender, positive bowel sounds MSK no focal spinal tenderness, no joint edema Neuro: non-focal, well-oriented, appropriate affect Breasts: Deferred   Lab Results  Component Value Date   WBC 3.6 (L) 11/20/2022   HGB 12.8 (L) 11/20/2022   HCT 36.4 (L) 11/20/2022   MCV 96.3 11/20/2022   PLT 84 (L) 11/20/2022   No results found for: "FERRITIN", "IRON", "TIBC", "UIBC", "IRONPCTSAT" Lab Results  Component Value Date   RBC 3.78 (L) 11/20/2022   Lab Results  Component Value Date   KPAFRELGTCHN 35.4 (H) 11/14/2022   LAMBDASER 15.7 11/14/2022   KAPLAMBRATIO 2.25 (H) 11/14/2022   Lab Results  Component Value Date   IGGSERUM 666 11/14/2022   IGA 41 (L) 11/14/2022   IGMSERUM 20 11/14/2022   Lab Results  Component Value Date   TOTALPROTELP 5.9 (L) 11/14/2022   ALBUMINELP 4.0 09/19/2022   A1GS 0.3 09/19/2022   A2GS 0.5 09/19/2022   BETS 0.8 09/19/2022   GAMS 0.6 09/19/2022   MSPIKE 0.3 (H) 09/19/2022   SPEI Comment 09/19/2022     Chemistry      Component Value Date/Time   NA 137 11/14/2022 1031   NA 142 10/15/2019 1500   K 4.0 11/14/2022 1031   CL 100 11/14/2022 1031   CO2 28 11/14/2022 1031   BUN 20 11/14/2022 1031   BUN 25 10/15/2019 1500   CREATININE 1.03 11/14/2022 1031      Component Value Date/Time   CALCIUM 8.5 (L) 11/14/2022 1031   ALKPHOS 87 11/14/2022 1031   AST 29 11/14/2022 1031   ALT 16 11/14/2022 1031   BILITOT 1.0 11/14/2022 1031       Impression and Plan: Riley Eaton is a very pleasant 82 yo caucasian gentleman with IgG Kappa myeloma.  CBC and CMP reviewed with Dr. Myna Hidalgo.  We will proceed with treatment  today as planned.  Follow-up in 1 months.   Eileen Stanford, NP 4/10/202410:44 AM

## 2022-11-20 NOTE — Progress Notes (Signed)
Ok to treat with Platelets 84 per Eileen Stanford, NP

## 2022-11-22 ENCOUNTER — Other Ambulatory Visit: Payer: Self-pay | Admitting: Hematology & Oncology

## 2022-11-22 DIAGNOSIS — D472 Monoclonal gammopathy: Secondary | ICD-10-CM

## 2022-12-12 ENCOUNTER — Inpatient Hospital Stay: Payer: Medicare PPO

## 2022-12-12 ENCOUNTER — Inpatient Hospital Stay: Payer: Medicare PPO | Attending: Hematology & Oncology

## 2022-12-12 VITALS — BP 112/61 | HR 54 | Temp 98.1°F | Resp 17

## 2022-12-12 DIAGNOSIS — Z5111 Encounter for antineoplastic chemotherapy: Secondary | ICD-10-CM | POA: Diagnosis present

## 2022-12-12 DIAGNOSIS — D472 Monoclonal gammopathy: Secondary | ICD-10-CM

## 2022-12-12 DIAGNOSIS — C9000 Multiple myeloma not having achieved remission: Secondary | ICD-10-CM | POA: Diagnosis not present

## 2022-12-12 DIAGNOSIS — Z5112 Encounter for antineoplastic immunotherapy: Secondary | ICD-10-CM | POA: Diagnosis not present

## 2022-12-12 LAB — CBC WITH DIFFERENTIAL (CANCER CENTER ONLY)
Abs Immature Granulocytes: 0.02 10*3/uL (ref 0.00–0.07)
Basophils Absolute: 0 10*3/uL (ref 0.0–0.1)
Basophils Relative: 0 %
Eosinophils Absolute: 0.1 10*3/uL (ref 0.0–0.5)
Eosinophils Relative: 2 %
HCT: 35.2 % — ABNORMAL LOW (ref 39.0–52.0)
Hemoglobin: 12.5 g/dL — ABNORMAL LOW (ref 13.0–17.0)
Immature Granulocytes: 0 %
Lymphocytes Relative: 27 %
Lymphs Abs: 1.2 10*3/uL (ref 0.7–4.0)
MCH: 34.3 pg — ABNORMAL HIGH (ref 26.0–34.0)
MCHC: 35.5 g/dL (ref 30.0–36.0)
MCV: 96.7 fL (ref 80.0–100.0)
Monocytes Absolute: 0.3 10*3/uL (ref 0.1–1.0)
Monocytes Relative: 7 %
Neutro Abs: 2.9 10*3/uL (ref 1.7–7.7)
Neutrophils Relative %: 64 %
Platelet Count: 87 10*3/uL — ABNORMAL LOW (ref 150–400)
RBC: 3.64 MIL/uL — ABNORMAL LOW (ref 4.22–5.81)
RDW: 12.9 % (ref 11.5–15.5)
WBC Count: 4.5 10*3/uL (ref 4.0–10.5)
nRBC: 0 % (ref 0.0–0.2)

## 2022-12-12 LAB — CMP (CANCER CENTER ONLY)
ALT: 14 U/L (ref 0–44)
AST: 23 U/L (ref 15–41)
Albumin: 4.1 g/dL (ref 3.5–5.0)
Alkaline Phosphatase: 100 U/L (ref 38–126)
Anion gap: 4 — ABNORMAL LOW (ref 5–15)
BUN: 19 mg/dL (ref 8–23)
CO2: 32 mmol/L (ref 22–32)
Calcium: 8.9 mg/dL (ref 8.9–10.3)
Chloride: 104 mmol/L (ref 98–111)
Creatinine: 1.13 mg/dL (ref 0.61–1.24)
GFR, Estimated: >60 mL/min (ref >60)
Glucose, Bld: 102 mg/dL — ABNORMAL HIGH (ref 70–99)
Potassium: 3.6 mmol/L (ref 3.5–5.1)
Sodium: 140 mmol/L (ref 135–145)
Total Bilirubin: 0.9 mg/dL (ref 0.3–1.2)
Total Protein: 6.3 g/dL — ABNORMAL LOW (ref 6.5–8.1)

## 2022-12-12 MED ORDER — ACETAMINOPHEN 325 MG PO TABS
650.0000 mg | ORAL_TABLET | Freq: Once | ORAL | Status: AC
  Administered 2022-12-12: 650 mg via ORAL
  Filled 2022-12-12: qty 2

## 2022-12-12 MED ORDER — DIPHENHYDRAMINE HCL 25 MG PO CAPS
25.0000 mg | ORAL_CAPSULE | Freq: Once | ORAL | Status: AC
  Administered 2022-12-12: 25 mg via ORAL
  Filled 2022-12-12: qty 1

## 2022-12-12 MED ORDER — DEXAMETHASONE 4 MG PO TABS
20.0000 mg | ORAL_TABLET | Freq: Once | ORAL | Status: AC
  Administered 2022-12-12: 20 mg via ORAL
  Filled 2022-12-12: qty 5

## 2022-12-12 MED ORDER — BORTEZOMIB CHEMO SQ INJECTION 3.5 MG (2.5MG/ML)
1.3000 mg/m2 | Freq: Once | INTRAMUSCULAR | Status: AC
  Administered 2022-12-12: 2.75 mg via SUBCUTANEOUS
  Filled 2022-12-12: qty 1.1

## 2022-12-12 MED ORDER — DARATUMUMAB-HYALURONIDASE-FIHJ 1800-30000 MG-UT/15ML ~~LOC~~ SOLN
1800.0000 mg | Freq: Once | SUBCUTANEOUS | Status: AC
  Administered 2022-12-12: 1800 mg via SUBCUTANEOUS
  Filled 2022-12-12: qty 15

## 2022-12-12 NOTE — Patient Instructions (Signed)
[facility]  Discharge Instructions: Thank you for choosing [facility] to provide your oncology and hematology care.   If you have a lab appointment with the Cancer Center, please go directly to the Cancer Center and check in at the registration area.  Wear comfortable clothing and clothing appropriate for easy access to any Portacath or PICC line.   We strive to give you quality time with your provider. You may need to reschedule your appointment if you arrive late (15 or more minutes).  Arriving late affects you and other patients whose appointments are after yours.  Also, if you miss three or more appointments without notifying the office, you may be dismissed from the clinic at the provider's discretion.      For prescription refill requests, have your pharmacy contact our office and allow 72 hours for refills to be completed.    Today you received the following chemotherapy and/or immunotherapy agents velcade, darzalex      To help prevent nausea and vomiting after your treatment, we encourage you to take your nausea medication as directed.  BELOW ARE SYMPTOMS THAT SHOULD BE REPORTED IMMEDIATELY: *FEVER GREATER THAN 100.4 F (38 C) OR HIGHER *CHILLS OR SWEATING *NAUSEA AND VOMITING THAT IS NOT CONTROLLED WITH YOUR NAUSEA MEDICATION *UNUSUAL SHORTNESS OF BREATH *UNUSUAL BRUISING OR BLEEDING *URINARY PROBLEMS (pain or burning when urinating, or frequent urination) *BOWEL PROBLEMS (unusual diarrhea, constipation, pain near the anus) TENDERNESS IN MOUTH AND THROAT WITH OR WITHOUT PRESENCE OF ULCERS (sore throat, sores in mouth, or a toothache) UNUSUAL RASH, SWELLING OR PAIN  UNUSUAL VAGINAL DISCHARGE OR ITCHING   Items with * indicate a potential emergency and should be followed up as soon as possible or go to the Emergency Department if any problems should occur.  Please show the CHEMOTHERAPY ALERT CARD or IMMUNOTHERAPY ALERT CARD at  check-in to the Emergency Department and triage nurse. Should you have questions after your visit or need to cancel or reschedule your appointment, please contact [facility]  [phone] and follow the prompts.  Office hours are 8:00 a.m. to 4:30 p.m. Monday - Friday. Please note that voicemails left after 4:00 p.m. may not be returned until the following business day.  We are closed weekends and major holidays. You have access to a nurse at all times for urgent questions. Please call the main number to the clinic [phone] and follow the prompts.  For any non-urgent questions, you may also contact your provider using MyChart. We now offer e-Visits for anyone 23 and older to request care online for non-urgent symptoms. For details visit [email or web address].   Also download the MyChart app! Go to the app store, search "MyChart", open the app, select [place], and log in with your MyChart username and password.

## 2022-12-12 NOTE — Progress Notes (Signed)
Ok to treat with platelets of 87 per DR Ennever. dph

## 2022-12-23 ENCOUNTER — Inpatient Hospital Stay (HOSPITAL_BASED_OUTPATIENT_CLINIC_OR_DEPARTMENT_OTHER): Payer: Medicare PPO | Admitting: Family

## 2022-12-23 ENCOUNTER — Inpatient Hospital Stay: Payer: Medicare PPO

## 2022-12-23 ENCOUNTER — Encounter: Payer: Self-pay | Admitting: Family

## 2022-12-23 VITALS — BP 108/64 | HR 60 | Temp 97.6°F | Resp 20 | Wt 189.1 lb

## 2022-12-23 DIAGNOSIS — D472 Monoclonal gammopathy: Secondary | ICD-10-CM

## 2022-12-23 DIAGNOSIS — Z5111 Encounter for antineoplastic chemotherapy: Secondary | ICD-10-CM | POA: Diagnosis not present

## 2022-12-23 LAB — CBC WITH DIFFERENTIAL (CANCER CENTER ONLY)
Abs Immature Granulocytes: 0.03 10*3/uL (ref 0.00–0.07)
Basophils Absolute: 0 10*3/uL (ref 0.0–0.1)
Basophils Relative: 0 %
Eosinophils Absolute: 0.1 10*3/uL (ref 0.0–0.5)
Eosinophils Relative: 1 %
HCT: 36 % — ABNORMAL LOW (ref 39.0–52.0)
Hemoglobin: 12.8 g/dL — ABNORMAL LOW (ref 13.0–17.0)
Immature Granulocytes: 1 %
Lymphocytes Relative: 28 %
Lymphs Abs: 1.2 10*3/uL (ref 0.7–4.0)
MCH: 34.3 pg — ABNORMAL HIGH (ref 26.0–34.0)
MCHC: 35.6 g/dL (ref 30.0–36.0)
MCV: 96.5 fL (ref 80.0–100.0)
Monocytes Absolute: 0.4 10*3/uL (ref 0.1–1.0)
Monocytes Relative: 10 %
Neutro Abs: 2.5 10*3/uL (ref 1.7–7.7)
Neutrophils Relative %: 60 %
Platelet Count: 75 10*3/uL — ABNORMAL LOW (ref 150–400)
RBC: 3.73 MIL/uL — ABNORMAL LOW (ref 4.22–5.81)
RDW: 13 % (ref 11.5–15.5)
WBC Count: 4.2 10*3/uL (ref 4.0–10.5)
nRBC: 0 % (ref 0.0–0.2)

## 2022-12-23 LAB — CMP (CANCER CENTER ONLY)
ALT: 13 U/L (ref 0–44)
AST: 21 U/L (ref 15–41)
Albumin: 4.6 g/dL (ref 3.5–5.0)
Alkaline Phosphatase: 85 U/L (ref 38–126)
Anion gap: 6 (ref 5–15)
BUN: 22 mg/dL (ref 8–23)
CO2: 31 mmol/L (ref 22–32)
Calcium: 9.4 mg/dL (ref 8.9–10.3)
Chloride: 103 mmol/L (ref 98–111)
Creatinine: 1.1 mg/dL (ref 0.61–1.24)
GFR, Estimated: >60 mL/min
Glucose, Bld: 184 mg/dL — ABNORMAL HIGH (ref 70–99)
Potassium: 3.7 mmol/L (ref 3.5–5.1)
Sodium: 140 mmol/L (ref 135–145)
Total Bilirubin: 1 mg/dL (ref 0.3–1.2)
Total Protein: 6.4 g/dL — ABNORMAL LOW (ref 6.5–8.1)

## 2022-12-23 LAB — LACTATE DEHYDROGENASE: LDH: 358 U/L — ABNORMAL HIGH (ref 98–192)

## 2022-12-23 MED ORDER — PROCHLORPERAZINE MALEATE 10 MG PO TABS
10.0000 mg | ORAL_TABLET | Freq: Once | ORAL | Status: AC
  Administered 2022-12-23: 10 mg via ORAL
  Filled 2022-12-23: qty 1

## 2022-12-23 MED ORDER — BORTEZOMIB CHEMO SQ INJECTION 3.5 MG (2.5MG/ML)
1.3000 mg/m2 | Freq: Once | INTRAMUSCULAR | Status: AC
  Administered 2022-12-23: 2.75 mg via SUBCUTANEOUS
  Filled 2022-12-23: qty 1.1

## 2022-12-23 NOTE — Progress Notes (Signed)
Ok to treat with platelets of 75 per Maralyn Sago, NP.

## 2022-12-23 NOTE — Progress Notes (Signed)
Hematology and Oncology Follow Up Visit  Riley Eaton [id number] 04/17/1941 82 y.o. 12/23/2022   Principle Diagnosis:  IgG Kappa myeloma  -- +1q and t(11:14)   Current Therapy:        Velcade/Decadron -- (3 wk on/1 wk off) -- s/p cycle 10 -- started on 08/24/2021 Faspro - s/p cycle 6 -  started on 04/30/2022   Interim History:  Mr. Riley Eaton is here today for follow-up and treatment. He is doing fairly well. He has chronic lower back pain that waxes and wanes and effects his mobility.  He had a fall from a chair a few weeks ago but thankfully states that he was not seriously injured. No syncope No blood loss, bruising or petechiae.  Last month her M-spike was 0.3 g/dL, IgG level was 409 mg/dL and kappa light chains 3.54 mg/dL.  No fever, chills, n/v, cough, rash, dizziness, SOB, chest pain, palpitations, abdominal pain or changes in bowel or bladder habits.  No swelling, tenderness, numbness or tingling in her extremities.  No appetite and hydration are good. Weight is stable at 189 lbs.   ECOG Performance Status: 1 - Symptomatic but completely ambulatory  Medications:  Allergies as of 12/23/2022   No Known Allergies      Medication List        Accurate as of Dec 23, 2022 10:39 AM. If you have any questions, ask your nurse or doctor.          acetaminophen 325 MG tablet Commonly known as: TYLENOL Take 2 tablets (650 mg total) by mouth every 6 (six) hours as needed for mild pain (or temp > 100).   acyclovir 400 MG tablet Commonly known as: ZOVIRAX TAKE 1 TABLET BY MOUTH TWICE A DAY   allopurinol 100 MG tablet Commonly known as: ZYLOPRIM TAKE 1 TABLET BY MOUTH EVERY DAY   aspirin 81 MG chewable tablet Chew 1 tablet (81 mg total) by mouth daily.   atorvastatin 40 MG tablet Commonly known as: LIPITOR Take 1 tablet (40 mg total) by mouth daily at 6 PM. What changed: when to take this   carvedilol 6.25 MG tablet Commonly known as: COREG Take 1 tablet by mouth  2 (two) times daily.   colchicine 0.6 MG tablet   cyanocobalamin 500 MCG tablet Commonly known as: VITAMIN B12 Take 500 mcg by mouth daily.   diphenhydramine-acetaminophen 25-500 MG Tabs tablet Commonly known as: TYLENOL PM Take 1 tablet by mouth at bedtime.   finasteride 5 MG tablet Commonly known as: PROSCAR Take 1 tablet (5 mg total) by mouth daily. For urination   furosemide 40 MG tablet Commonly known as: LASIX TAKE 1 TABLET BY MOUTH EVERY DAY   HYDROcodone-acetaminophen 7.5-325 MG tablet Commonly known as: NORCO Take 1 tablet by mouth 4 (four) times daily as needed for moderate pain.   LORazepam 0.5 MG tablet Commonly known as: ATIVAN TAKE 1 TABLET (0.5 MG TOTAL) BY MOUTH EVERY 6 (SIX) HOURS AS NEEDED (NAUSEA OR VOMITING).   losartan 25 MG tablet Commonly known as: COZAAR Take 25 mg by mouth daily.   Movantik 25 MG Tabs tablet Generic drug: naloxegol oxalate Take 25 mg by mouth daily.   OLANZapine 5 MG tablet Commonly known as: ZYPREXA Take 1 tablet (5 mg total) by mouth daily.   ondansetron 4 MG disintegrating tablet Commonly known as: Zofran ODT Take 1 tablet (4 mg total) by mouth every 8 (eight) hours as needed for nausea or vomiting.   pantoprazole 40 MG tablet  Commonly known as: PROTONIX Take 1 tablet (40 mg total) by mouth at bedtime. What changed: when to take this   polyethylene glycol 17 g packet Commonly known as: MIRALAX / GLYCOLAX Take 17 g by mouth daily. What changed:  when to take this reasons to take this   pregabalin 150 MG capsule Commonly known as: LYRICA Take 150 mg by mouth 2 (two) times daily.   traZODone 50 MG tablet Commonly known as: DESYREL Take 50 mg by mouth at bedtime.        Allergies: No Known Allergies  Past Medical History, Surgical history, Social history, and Family History were reviewed and updated.  Review of Systems: All other 10 point review of systems is negative.   Physical Exam:  vitals were  not taken for this visit.   Wt Readings from Last 3 Encounters:  11/20/22 193 lb (87.5 kg)  10/17/22 194 lb (88 kg)  08/21/22 193 lb 12.8 oz (87.9 kg)    Ocular: Sclerae unicteric, pupils equal, round and reactive to light Ear-nose-throat: Oropharynx clear, dentition fair Lymphatic: No cervical or supraclavicular adenopathy Lungs no rales or rhonchi, good excursion bilaterally Heart regular rate and rhythm, no murmur appreciated Abd soft, nontender, positive bowel sounds MSK no focal spinal tenderness, no joint edema Neuro: non-focal, well-oriented, appropriate affect Breasts: Deferred   Lab Results  Component Value Date   WBC 4.5 12/12/2022   HGB 12.5 (L) 12/12/2022   HCT 35.2 (L) 12/12/2022   MCV 96.7 12/12/2022   PLT 87 (L) 12/12/2022   No results found for: "FERRITIN", "IRON", "TIBC", "UIBC", "IRONPCTSAT" Lab Results  Component Value Date   RBC 3.64 (L) 12/12/2022   Lab Results  Component Value Date   KPAFRELGTCHN 35.4 (H) 11/14/2022   LAMBDASER 15.7 11/14/2022   KAPLAMBRATIO 2.25 (H) 11/14/2022   Lab Results  Component Value Date   IGGSERUM 666 11/14/2022   IGA 41 (L) 11/14/2022   IGMSERUM 20 11/14/2022   Lab Results  Component Value Date   TOTALPROTELP 5.9 (L) 11/14/2022   ALBUMINELP 4.0 09/19/2022   A1GS 0.3 09/19/2022   A2GS 0.5 09/19/2022   BETS 0.8 09/19/2022   GAMS 0.6 09/19/2022   MSPIKE 0.3 (H) 09/19/2022   SPEI Comment 09/19/2022     Chemistry      Component Value Date/Time   NA 140 12/12/2022 1017   NA 142 10/15/2019 1500   K 3.6 12/12/2022 1017   CL 104 12/12/2022 1017   CO2 32 12/12/2022 1017   BUN 19 12/12/2022 1017   BUN 25 10/15/2019 1500   CREATININE 1.13 12/12/2022 1017      Component Value Date/Time   CALCIUM 8.9 12/12/2022 1017   ALKPHOS 100 12/12/2022 1017   AST 23 12/12/2022 1017   ALT 14 12/12/2022 1017   BILITOT 0.9 12/12/2022 1017       Impression and Plan: Mr. Riley Eaton is a very pleasant 82 yo caucasian gentleman  with IgG Kappa myeloma.  CBC and CMP reviewed with Dr. Myna Hidalgo.  We will proceed with Velcade as planned.  Follow-up in 1 month.   Eileen Stanford, NP 5/13/202410:39 AM

## 2022-12-23 NOTE — Patient Instructions (Signed)
[facility]  Discharge Instructions: Thank you for choosing [facility] to provide your oncology and hematology care.   If you have a lab appointment with the Cancer Center, please go directly to the Cancer Center and check in at the registration area.  Wear comfortable clothing and clothing appropriate for easy access to any Portacath or PICC line.   We strive to give you quality time with your provider. You may need to reschedule your appointment if you arrive late (15 or more minutes).  Arriving late affects you and other patients whose appointments are after yours.  Also, if you miss three or more appointments without notifying the office, you may be dismissed from the clinic at the provider's discretion.      For prescription refill requests, have your pharmacy contact our office and allow 72 hours for refills to be completed.    Today you received the following chemotherapy and/or immunotherapy agents Velcade.      To help prevent nausea and vomiting after your treatment, we encourage you to take your nausea medication as directed.  BELOW ARE SYMPTOMS THAT SHOULD BE REPORTED IMMEDIATELY: *FEVER GREATER THAN 100.4 F (38 C) OR HIGHER *CHILLS OR SWEATING *NAUSEA AND VOMITING THAT IS NOT CONTROLLED WITH YOUR NAUSEA MEDICATION *UNUSUAL SHORTNESS OF BREATH *UNUSUAL BRUISING OR BLEEDING *URINARY PROBLEMS (pain or burning when urinating, or frequent urination) *BOWEL PROBLEMS (unusual diarrhea, constipation, pain near the anus) TENDERNESS IN MOUTH AND THROAT WITH OR WITHOUT PRESENCE OF ULCERS (sore throat, sores in mouth, or a toothache) UNUSUAL RASH, SWELLING OR PAIN  UNUSUAL VAGINAL DISCHARGE OR ITCHING   Items with * indicate a potential emergency and should be followed up as soon as possible or go to the Emergency Department if any problems should occur.  Please show the CHEMOTHERAPY ALERT CARD or IMMUNOTHERAPY ALERT CARD at check-in  to the Emergency Department and triage nurse. Should you have questions after your visit or need to cancel or reschedule your appointment, please contact [facility]  [phone] and follow the prompts.  Office hours are 8:00 a.m. to 4:30 p.m. Monday - Friday. Please note that voicemails left after 4:00 p.m. may not be returned until the following business day.  We are closed weekends and major holidays. You have access to a nurse at all times for urgent questions. Please call the main number to the clinic [phone] and follow the prompts.  For any non-urgent questions, you may also contact your provider using MyChart. We now offer e-Visits for anyone 18 and older to request care online for non-urgent symptoms. For details visit [email or web address].   Also download the MyChart app! Go to the app store, search "MyChart", open the app, select , and log in with your MyChart username and password.   

## 2022-12-24 LAB — KAPPA/LAMBDA LIGHT CHAINS
Kappa free light chain: 40.7 mg/L — ABNORMAL HIGH (ref 3.3–19.4)
Kappa, lambda light chain ratio: 2.39 — ABNORMAL HIGH (ref 0.26–1.65)
Lambda free light chains: 17 mg/L (ref 5.7–26.3)

## 2022-12-24 LAB — IGG, IGA, IGM
IgA: 41 mg/dL — ABNORMAL LOW (ref 61–437)
IgG (Immunoglobin G), Serum: 694 mg/dL (ref 603–1613)
IgM (Immunoglobulin M), Srm: 23 mg/dL (ref 15–143)

## 2022-12-25 ENCOUNTER — Other Ambulatory Visit: Payer: Self-pay

## 2022-12-26 LAB — PROTEIN ELECTROPHORESIS, SERUM
A/G Ratio: 1.9 — ABNORMAL HIGH (ref 0.7–1.7)
Albumin ELP: 3.9 g/dL (ref 2.9–4.4)
Alpha-1-Globulin: 0.3 g/dL (ref 0.0–0.4)
Alpha-2-Globulin: 0.5 g/dL (ref 0.4–1.0)
Beta Globulin: 0.7 g/dL (ref 0.7–1.3)
Gamma Globulin: 0.6 g/dL (ref 0.4–1.8)
Globulin, Total: 2.1 g/dL — ABNORMAL LOW (ref 2.2–3.9)
M-Spike, %: 0.3 g/dL — ABNORMAL HIGH
Total Protein ELP: 6 g/dL (ref 6.0–8.5)

## 2023-01-05 ENCOUNTER — Other Ambulatory Visit: Payer: Self-pay

## 2023-01-09 ENCOUNTER — Inpatient Hospital Stay: Payer: Medicare PPO

## 2023-01-09 VITALS — BP 115/75 | HR 58 | Temp 97.8°F | Resp 20

## 2023-01-09 DIAGNOSIS — D472 Monoclonal gammopathy: Secondary | ICD-10-CM

## 2023-01-09 DIAGNOSIS — Z5111 Encounter for antineoplastic chemotherapy: Secondary | ICD-10-CM | POA: Diagnosis not present

## 2023-01-09 LAB — CBC WITH DIFFERENTIAL (CANCER CENTER ONLY)
Abs Immature Granulocytes: 0.02 10*3/uL (ref 0.00–0.07)
Basophils Absolute: 0 10*3/uL (ref 0.0–0.1)
Basophils Relative: 0 %
Eosinophils Absolute: 0.1 10*3/uL (ref 0.0–0.5)
Eosinophils Relative: 2 %
HCT: 35.9 % — ABNORMAL LOW (ref 39.0–52.0)
Hemoglobin: 12.8 g/dL — ABNORMAL LOW (ref 13.0–17.0)
Immature Granulocytes: 1 %
Lymphocytes Relative: 29 %
Lymphs Abs: 1.2 10*3/uL (ref 0.7–4.0)
MCH: 34.4 pg — ABNORMAL HIGH (ref 26.0–34.0)
MCHC: 35.7 g/dL (ref 30.0–36.0)
MCV: 96.5 fL (ref 80.0–100.0)
Monocytes Absolute: 0.4 10*3/uL (ref 0.1–1.0)
Monocytes Relative: 9 %
Neutro Abs: 2.4 10*3/uL (ref 1.7–7.7)
Neutrophils Relative %: 59 %
Platelet Count: 84 10*3/uL — ABNORMAL LOW (ref 150–400)
RBC: 3.72 MIL/uL — ABNORMAL LOW (ref 4.22–5.81)
RDW: 12.6 % (ref 11.5–15.5)
WBC Count: 4.1 10*3/uL (ref 4.0–10.5)
nRBC: 0 % (ref 0.0–0.2)

## 2023-01-09 LAB — CMP (CANCER CENTER ONLY)
ALT: 12 U/L (ref 0–44)
AST: 22 U/L (ref 15–41)
Albumin: 4.5 g/dL (ref 3.5–5.0)
Alkaline Phosphatase: 98 U/L (ref 38–126)
Anion gap: 7 (ref 5–15)
BUN: 20 mg/dL (ref 8–23)
CO2: 31 mmol/L (ref 22–32)
Calcium: 9.2 mg/dL (ref 8.9–10.3)
Chloride: 102 mmol/L (ref 98–111)
Creatinine: 1.07 mg/dL (ref 0.61–1.24)
GFR, Estimated: >60 mL/min (ref >60)
Glucose, Bld: 121 mg/dL — ABNORMAL HIGH (ref 70–99)
Potassium: 3.9 mmol/L (ref 3.5–5.1)
Sodium: 140 mmol/L (ref 135–145)
Total Bilirubin: 1.1 mg/dL (ref 0.3–1.2)
Total Protein: 6.5 g/dL (ref 6.5–8.1)

## 2023-01-09 MED ORDER — DIPHENHYDRAMINE HCL 25 MG PO CAPS
25.0000 mg | ORAL_CAPSULE | Freq: Once | ORAL | Status: AC
  Administered 2023-01-09: 25 mg via ORAL
  Filled 2023-01-09: qty 1

## 2023-01-09 MED ORDER — DARATUMUMAB-HYALURONIDASE-FIHJ 1800-30000 MG-UT/15ML ~~LOC~~ SOLN
1800.0000 mg | Freq: Once | SUBCUTANEOUS | Status: AC
  Administered 2023-01-09: 1800 mg via SUBCUTANEOUS
  Filled 2023-01-09: qty 15

## 2023-01-09 MED ORDER — BORTEZOMIB CHEMO SQ INJECTION 3.5 MG (2.5MG/ML)
1.3000 mg/m2 | Freq: Once | INTRAMUSCULAR | Status: AC
  Administered 2023-01-09: 2.75 mg via SUBCUTANEOUS
  Filled 2023-01-09: qty 1.1

## 2023-01-09 MED ORDER — ACETAMINOPHEN 325 MG PO TABS
650.0000 mg | ORAL_TABLET | Freq: Once | ORAL | Status: AC
  Administered 2023-01-09: 650 mg via ORAL
  Filled 2023-01-09: qty 2

## 2023-01-09 MED ORDER — DEXAMETHASONE 4 MG PO TABS
20.0000 mg | ORAL_TABLET | Freq: Once | ORAL | Status: AC
  Administered 2023-01-09: 20 mg via ORAL
  Filled 2023-01-09: qty 5

## 2023-01-09 NOTE — Progress Notes (Signed)
Ok to treat today with platelets of 84,000 per dR Ennever.

## 2023-01-09 NOTE — Patient Instructions (Signed)
[facility]  Discharge Instructions: Thank you for choosing [facility] to provide your oncology and hematology care.   If you have a lab appointment with the Cancer Center, please go directly to the Cancer Center and check in at the registration area.  Wear comfortable clothing and clothing appropriate for easy access to any Portacath or PICC line.   We strive to give you quality time with your provider. You may need to reschedule your appointment if you arrive late (15 or more minutes).  Arriving late affects you and other patients whose appointments are after yours.  Also, if you miss three or more appointments without notifying the office, you may be dismissed from the clinic at the provider's discretion.      For prescription refill requests, have your pharmacy contact our office and allow 72 hours for refills to be completed.    Today you received the following chemotherapy and/or immunotherapy agents Darzalex, Velcade.   To help prevent nausea and vomiting after your treatment, we encourage you to take your nausea medication as directed.  BELOW ARE SYMPTOMS THAT SHOULD BE REPORTED IMMEDIATELY: *FEVER GREATER THAN 100.4 F (38 C) OR HIGHER *CHILLS OR SWEATING *NAUSEA AND VOMITING THAT IS NOT CONTROLLED WITH YOUR NAUSEA MEDICATION *UNUSUAL SHORTNESS OF BREATH *UNUSUAL BRUISING OR BLEEDING *URINARY PROBLEMS (pain or burning when urinating, or frequent urination) *BOWEL PROBLEMS (unusual diarrhea, constipation, pain near the anus) TENDERNESS IN MOUTH AND THROAT WITH OR WITHOUT PRESENCE OF ULCERS (sore throat, sores in mouth, or a toothache) UNUSUAL RASH, SWELLING OR PAIN  UNUSUAL VAGINAL DISCHARGE OR ITCHING   Items with * indicate a potential emergency and should be followed up as soon as possible or go to the Emergency Department if any problems should occur.  Please show the CHEMOTHERAPY ALERT CARD or IMMUNOTHERAPY ALERT CARD at  check-in to the Emergency Department and triage nurse. Should you have questions after your visit or need to cancel or reschedule your appointment, please contact [facility]  [phone] and follow the prompts.  Office hours are 8:00 a.m. to 4:30 p.m. Monday - Friday. Please note that voicemails left after 4:00 p.m. may not be returned until the following business day.  We are closed weekends and major holidays. You have access to a nurse at all times for urgent questions. Please call the main number to the clinic [phone] and follow the prompts.  For any non-urgent questions, you may also contact your provider using MyChart. We now offer e-Visits for anyone 18 and older to request care online for non-urgent symptoms. For details visit [email or web address].   Also download the MyChart app! Go to the app store, search "MyChart", open the app, select , and log in with your MyChart username and password.   

## 2023-01-16 ENCOUNTER — Inpatient Hospital Stay: Payer: Medicare PPO

## 2023-01-16 ENCOUNTER — Encounter: Payer: Self-pay | Admitting: Medical Oncology

## 2023-01-16 ENCOUNTER — Inpatient Hospital Stay: Payer: Medicare PPO | Attending: Hematology & Oncology | Admitting: Medical Oncology

## 2023-01-16 DIAGNOSIS — Z5112 Encounter for antineoplastic immunotherapy: Secondary | ICD-10-CM | POA: Diagnosis not present

## 2023-01-16 DIAGNOSIS — Z5111 Encounter for antineoplastic chemotherapy: Secondary | ICD-10-CM | POA: Insufficient documentation

## 2023-01-16 DIAGNOSIS — D472 Monoclonal gammopathy: Secondary | ICD-10-CM

## 2023-01-16 DIAGNOSIS — C9000 Multiple myeloma not having achieved remission: Secondary | ICD-10-CM | POA: Insufficient documentation

## 2023-01-16 LAB — CMP (CANCER CENTER ONLY)
ALT: 11 U/L (ref 0–44)
AST: 20 U/L (ref 15–41)
Albumin: 4.3 g/dL (ref 3.5–5.0)
Alkaline Phosphatase: 90 U/L (ref 38–126)
Anion gap: 8 (ref 5–15)
BUN: 22 mg/dL (ref 8–23)
CO2: 30 mmol/L (ref 22–32)
Calcium: 9.1 mg/dL (ref 8.9–10.3)
Chloride: 104 mmol/L (ref 98–111)
Creatinine: 1.01 mg/dL (ref 0.61–1.24)
GFR, Estimated: >60 mL/min (ref >60)
Glucose, Bld: 133 mg/dL — ABNORMAL HIGH (ref 70–99)
Potassium: 3.7 mmol/L (ref 3.5–5.1)
Sodium: 142 mmol/L (ref 135–145)
Total Bilirubin: 1 mg/dL (ref 0.3–1.2)
Total Protein: 6.2 g/dL — ABNORMAL LOW (ref 6.5–8.1)

## 2023-01-16 LAB — CBC WITH DIFFERENTIAL (CANCER CENTER ONLY)
Abs Immature Granulocytes: 0.02 10*3/uL (ref 0.00–0.07)
Basophils Absolute: 0 10*3/uL (ref 0.0–0.1)
Basophils Relative: 0 %
Eosinophils Absolute: 0.1 10*3/uL (ref 0.0–0.5)
Eosinophils Relative: 2 %
HCT: 34.3 % — ABNORMAL LOW (ref 39.0–52.0)
Hemoglobin: 12.1 g/dL — ABNORMAL LOW (ref 13.0–17.0)
Immature Granulocytes: 1 %
Lymphocytes Relative: 20 %
Lymphs Abs: 0.7 10*3/uL (ref 0.7–4.0)
MCH: 34.1 pg — ABNORMAL HIGH (ref 26.0–34.0)
MCHC: 35.3 g/dL (ref 30.0–36.0)
MCV: 96.6 fL (ref 80.0–100.0)
Monocytes Absolute: 0.3 10*3/uL (ref 0.1–1.0)
Monocytes Relative: 8 %
Neutro Abs: 2.5 10*3/uL (ref 1.7–7.7)
Neutrophils Relative %: 69 %
Platelet Count: 75 10*3/uL — ABNORMAL LOW (ref 150–400)
RBC: 3.55 MIL/uL — ABNORMAL LOW (ref 4.22–5.81)
RDW: 12.6 % (ref 11.5–15.5)
WBC Count: 3.6 10*3/uL — ABNORMAL LOW (ref 4.0–10.5)
nRBC: 0 % (ref 0.0–0.2)

## 2023-01-16 LAB — LACTATE DEHYDROGENASE: LDH: 366 U/L — ABNORMAL HIGH (ref 98–192)

## 2023-01-16 MED ORDER — BORTEZOMIB CHEMO SQ INJECTION 3.5 MG (2.5MG/ML)
1.3000 mg/m2 | Freq: Once | INTRAMUSCULAR | Status: AC
  Administered 2023-01-16: 2.75 mg via SUBCUTANEOUS
  Filled 2023-01-16: qty 1.1

## 2023-01-16 MED ORDER — PROCHLORPERAZINE MALEATE 10 MG PO TABS
10.0000 mg | ORAL_TABLET | Freq: Once | ORAL | Status: AC
  Administered 2023-01-16: 10 mg via ORAL
  Filled 2023-01-16: qty 1

## 2023-01-16 NOTE — Patient Instructions (Signed)
[facility]  Discharge Instructions: Thank you for choosing [facility] to provide your oncology and hematology care.   If you have a lab appointment with the Cancer Center, please go directly to the Cancer Center and check in at the registration area.  Wear comfortable clothing and clothing appropriate for easy access to any Portacath or PICC line.   We strive to give you quality time with your provider. You may need to reschedule your appointment if you arrive late (15 or more minutes).  Arriving late affects you and other patients whose appointments are after yours.  Also, if you miss three or more appointments without notifying the office, you may be dismissed from the clinic at the provider's discretion.      For prescription refill requests, have your pharmacy contact our office and allow 72 hours for refills to be completed.    Today you received the following chemotherapy and/or immunotherapy agents Velcade.      To help prevent nausea and vomiting after your treatment, we encourage you to take your nausea medication as directed.  BELOW ARE SYMPTOMS THAT SHOULD BE REPORTED IMMEDIATELY: *FEVER GREATER THAN 100.4 F (38 C) OR HIGHER *CHILLS OR SWEATING *NAUSEA AND VOMITING THAT IS NOT CONTROLLED WITH YOUR NAUSEA MEDICATION *UNUSUAL SHORTNESS OF BREATH *UNUSUAL BRUISING OR BLEEDING *URINARY PROBLEMS (pain or burning when urinating, or frequent urination) *BOWEL PROBLEMS (unusual diarrhea, constipation, pain near the anus) TENDERNESS IN MOUTH AND THROAT WITH OR WITHOUT PRESENCE OF ULCERS (sore throat, sores in mouth, or a toothache) UNUSUAL RASH, SWELLING OR PAIN  UNUSUAL VAGINAL DISCHARGE OR ITCHING   Items with * indicate a potential emergency and should be followed up as soon as possible or go to the Emergency Department if any problems should occur.  Please show the CHEMOTHERAPY ALERT CARD or IMMUNOTHERAPY ALERT CARD at check-in  to the Emergency Department and triage nurse. Should you have questions after your visit or need to cancel or reschedule your appointment, please contact [facility]  [phone] and follow the prompts.  Office hours are 8:00 a.m. to 4:30 p.m. Monday - Friday. Please note that voicemails left after 4:00 p.m. may not be returned until the following business day.  We are closed weekends and major holidays. You have access to a nurse at all times for urgent questions. Please call the main number to the clinic [phone] and follow the prompts.  For any non-urgent questions, you may also contact your provider using MyChart. We now offer e-Visits for anyone 18 and older to request care online for non-urgent symptoms. For details visit [email or web address].   Also download the MyChart app! Go to the app store, search "MyChart", open the app, select [facility], and log in with your MyChart username and password.   

## 2023-01-16 NOTE — Progress Notes (Signed)
Hematology and Oncology Follow Up Visit  Riley Eaton [id number] Nov 05, 1940 82 y.o. 01/16/2023   Principle Diagnosis:  IgG Kappa myeloma  -- +1q and t(11:14)   Current Therapy:        Velcade/Decadron -- (3 wk on/1 wk off) -- s/p cycle 10 -- started on 08/24/2021 Faspro - s/p cycle 6 -  started on 04/30/2022   Interim History:  Riley Eaton is here today for follow-up and treatment. He is doing fairly well. He has chronic lower back pain that waxes and wanes and effects his mobility. He uses a rolling walker well.    No blood loss, bruising or petechiae.  Last month his M-spike was 0.3 g/dL, IgG level was 409 mg/dL and kappa light chains 4.07 mg/dL.  No fever, chills, n/v, cough, rash, dizziness, SOB, chest pain, palpitations, abdominal pain or changes in bowel or bladder habits.  No swelling, tenderness, numbness or tingling in her extremities.  No appetite and hydration are good.   Wt Readings from Last 3 Encounters:  01/16/23 193 lb (87.5 kg)  12/23/22 189 lb 1.9 oz (85.8 kg)  11/20/22 193 lb (87.5 kg)     ECOG Performance Status: 1 - Symptomatic but completely ambulatory  Medications:  Allergies as of 01/16/2023   No Known Allergies      Medication List        Accurate as of January 16, 2023  9:19 AM. If you have any questions, ask your nurse or doctor.          acetaminophen 325 MG tablet Commonly known as: TYLENOL Take 2 tablets (650 mg total) by mouth every 6 (six) hours as needed for mild pain (or temp > 100).   acyclovir 400 MG tablet Commonly known as: ZOVIRAX TAKE 1 TABLET BY MOUTH TWICE A DAY   allopurinol 100 MG tablet Commonly known as: ZYLOPRIM TAKE 1 TABLET BY MOUTH EVERY DAY   aspirin 81 MG chewable tablet Chew 1 tablet (81 mg total) by mouth daily.   atorvastatin 40 MG tablet Commonly known as: LIPITOR Take 1 tablet (40 mg total) by mouth daily at 6 PM. What changed: when to take this   carvedilol 6.25 MG tablet Commonly known as:  COREG Take 1 tablet by mouth 2 (two) times daily.   colchicine 0.6 MG tablet   cyanocobalamin 500 MCG tablet Commonly known as: VITAMIN B12 Take 500 mcg by mouth daily.   diphenhydramine-acetaminophen 25-500 MG Tabs tablet Commonly known as: TYLENOL PM Take 1 tablet by mouth at bedtime.   finasteride 5 MG tablet Commonly known as: PROSCAR Take 1 tablet (5 mg total) by mouth daily. For urination   furosemide 40 MG tablet Commonly known as: LASIX TAKE 1 TABLET BY MOUTH EVERY DAY   HYDROcodone-acetaminophen 7.5-325 MG tablet Commonly known as: NORCO Take 1 tablet by mouth 4 (four) times daily as needed for moderate pain.   LORazepam 0.5 MG tablet Commonly known as: ATIVAN TAKE 1 TABLET (0.5 MG TOTAL) BY MOUTH EVERY 6 (SIX) HOURS AS NEEDED (NAUSEA OR VOMITING).   losartan 25 MG tablet Commonly known as: COZAAR Take 25 mg by mouth daily.   Movantik 25 MG Tabs tablet Generic drug: naloxegol oxalate Take 25 mg by mouth daily.   OLANZapine 5 MG tablet Commonly known as: ZYPREXA Take 1 tablet (5 mg total) by mouth daily.   ondansetron 4 MG disintegrating tablet Commonly known as: Zofran ODT Take 1 tablet (4 mg total) by mouth every 8 (eight) hours as  needed for nausea or vomiting.   pantoprazole 40 MG tablet Commonly known as: PROTONIX Take 1 tablet (40 mg total) by mouth at bedtime. What changed: when to take this   polyethylene glycol 17 g packet Commonly known as: MIRALAX / GLYCOLAX Take 17 g by mouth daily. What changed:  when to take this reasons to take this   pregabalin 150 MG capsule Commonly known as: LYRICA Take 150 mg by mouth 2 (two) times daily.   traZODone 50 MG tablet Commonly known as: DESYREL Take 50 mg by mouth at bedtime.        Allergies: No Known Allergies  Past Medical History, Surgical history, Social history, and Family History were reviewed and updated.  Review of Systems: All other 10 point review of systems is negative.    Physical Exam:  weight is 193 lb (87.5 kg). His oral temperature is 97.6 F (36.4 C). His blood pressure is 105/76 and his pulse is 55 (abnormal). His respiration is 20 and oxygen saturation is 97%.   Wt Readings from Last 3 Encounters:  01/16/23 193 lb (87.5 kg)  12/23/22 189 lb 1.9 oz (85.8 kg)  11/20/22 193 lb (87.5 kg)    Ocular: Sclerae unicteric, pupils equal, round and reactive to light Ear-nose-throat: Oropharynx clear, dentition fair Lymphatic: No cervical or supraclavicular adenopathy Lungs no rales or rhonchi, good excursion bilaterally Heart regular rate and rhythm, no murmur appreciated Abd soft, nontender, positive bowel sounds MSK no focal spinal tenderness, no joint edema Neuro: non-focal, well-oriented, appropriate affect  Lab Results  Component Value Date   WBC 3.6 (L) 01/16/2023   HGB 12.1 (L) 01/16/2023   HCT 34.3 (L) 01/16/2023   MCV 96.6 01/16/2023   PLT 75 (L) 01/16/2023   No results found for: "FERRITIN", "IRON", "TIBC", "UIBC", "IRONPCTSAT" Lab Results  Component Value Date   RBC 3.55 (L) 01/16/2023   Lab Results  Component Value Date   KPAFRELGTCHN 40.7 (H) 12/23/2022   LAMBDASER 17.0 12/23/2022   KAPLAMBRATIO 2.39 (H) 12/23/2022   Lab Results  Component Value Date   IGGSERUM 694 12/23/2022   IGA 41 (L) 12/23/2022   IGMSERUM 23 12/23/2022   Lab Results  Component Value Date   TOTALPROTELP 6.0 12/23/2022   ALBUMINELP 3.9 12/23/2022   A1GS 0.3 12/23/2022   A2GS 0.5 12/23/2022   BETS 0.7 12/23/2022   GAMS 0.6 12/23/2022   MSPIKE 0.3 (H) 12/23/2022   SPEI Comment 12/23/2022     Chemistry      Component Value Date/Time   NA 140 01/09/2023 1002   NA 142 10/15/2019 1500   K 3.9 01/09/2023 1002   CL 102 01/09/2023 1002   CO2 31 01/09/2023 1002   BUN 20 01/09/2023 1002   BUN 25 10/15/2019 1500   CREATININE 1.07 01/09/2023 1002      Component Value Date/Time   CALCIUM 9.2 01/09/2023 1002   ALKPHOS 98 01/09/2023 1002   AST 22  01/09/2023 1002   ALT 12 01/09/2023 1002   BILITOT 1.1 01/09/2023 1002       Impression and Plan: Riley Eaton is a very pleasant 82 yo caucasian gentleman with IgG Kappa myeloma.  CBC and CMP reviewed- very similar to last month prior to treatment which he did well with and count actually improved thereafter treatment. For this reason we will proceed forward with treatment of Velcade today  Disposition: Velcade/decadron today RTC 1 month APP/MD, labs (CBC w/, CMP,SPEP, QIG, Light chains, LDH ), velcade  [facility] M[facility]  [place], [place] 6/6/20249:19 AM

## 2023-01-16 NOTE — Progress Notes (Signed)
Ok to treat with platelets of 75 per Riley Eaton, Riley Eaton. dph

## 2023-01-17 LAB — KAPPA/LAMBDA LIGHT CHAINS
Kappa free light chain: 36.2 mg/L — ABNORMAL HIGH (ref 3.3–19.4)
Kappa, lambda light chain ratio: 2.28 — ABNORMAL HIGH (ref 0.26–1.65)
Lambda free light chains: 15.9 mg/L (ref 5.7–26.3)

## 2023-01-18 LAB — IGG, IGA, IGM
IgA: 41 mg/dL — ABNORMAL LOW (ref 61–437)
IgG (Immunoglobin G), Serum: 707 mg/dL (ref 603–1613)
IgM (Immunoglobulin M), Srm: 26 mg/dL (ref 15–143)

## 2023-01-21 LAB — PROTEIN ELECTROPHORESIS, SERUM
A/G Ratio: 1.9 — ABNORMAL HIGH (ref 0.7–1.7)
Albumin ELP: 3.8 g/dL (ref 2.9–4.4)
Alpha-1-Globulin: 0.3 g/dL (ref 0.0–0.4)
Alpha-2-Globulin: 0.4 g/dL (ref 0.4–1.0)
Beta Globulin: 0.7 g/dL (ref 0.7–1.3)
Gamma Globulin: 0.6 g/dL (ref 0.4–1.8)
Globulin, Total: 2 g/dL — ABNORMAL LOW (ref 2.2–3.9)
M-Spike, %: 0.3 g/dL — ABNORMAL HIGH
Total Protein ELP: 5.8 g/dL — ABNORMAL LOW (ref 6.0–8.5)

## 2023-01-22 ENCOUNTER — Other Ambulatory Visit: Payer: Self-pay

## 2023-01-24 NOTE — Progress Notes (Unsigned)
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/2019   Nerve pain    Stage 3a chronic kidney disease (HCC) 08/06/2020   Thoracic aortic aneurysm [facility])    Past Surgical History:  Procedure Laterality Date   BACK SURGERY     09-2016   back surgey     CHOLECYSTECTOMY N/A 08/07/2020   Procedure: LAPAROSCOPIC CHOLECYSTECTOMY;  Surgeon: Berna Bue, MD;  Location: [facility] OR;  Service: General;  Laterality: N/A;   ENDOSCOPIC RETROGRADE CHOLANGIOPANCREATOGRAPHY (ERCP) WITH PROPOFOL N/A 09/25/2018   Procedure: ENDOSCOPIC RETROGRADE CHOLANGIOPANCREATOGRAPHY (ERCP) WITH PROPOFOL;  Surgeon: Kerin Salen, MD;  Location: [facility] ENDOSCOPY;  Service: Gastroenterology;  Laterality: N/A;   INSERTION OF SUPRAPUBIC CATHETER N/A 10/26/2019   Procedure: INSERTION OF SUPRAPUBIC CATHETER;  Surgeon: Bjorn Pippin, MD;  Location: [facility] ORS;  Service: Urology;  Laterality: N/A;   PANCREATIC STENT PLACEMENT  09/25/2018   Procedure: PANCREATIC STENT PLACEMENT;  Surgeon: Kerin Salen, MD;  Location: [facility] ENDOSCOPY;  Service: Gastroenterology;;   REMOVAL OF STONES  09/25/2018   Procedure: REMOVAL OF STONES;  Surgeon: Kerin Salen,  MD;  Location: [facility] ENDOSCOPY;  Service: Gastroenterology;;   Dennison Mascot  09/25/2018   Procedure: Dennison Mascot;  Surgeon: Kerin Salen, MD;  Location: [facility] ENDOSCOPY;  Service: Gastroenterology;;   TRANSURETHRAL RESECTION OF PROSTATE N/A 10/26/2019   Procedure: TRANSURETHRAL RESECTION OF THE PROSTATE (TURP);  Surgeon: Bjorn Pippin, MD;  Location: [facility] ORS;  Service: Urology;  Laterality: N/A;    Allergies  No Known Allergies  History of Present Illness    Riley Eaton has a past medical history of thoracic aortic aneurysm, coronary artery disease, chronic back pain, chronic opioid use, dilated cardiac myopathy, AMS, pancreatic stent placement, and non-STEMI.   He was seen in hospital consultation on 07/13/2019 for elevated cardiac enzymes in the setting of altered mental status and cholelithiasis.  Consult was requested by Dr. Gerri Lins.  Her echocardiogram from 07/14/2019 showed an LVEF of 30 to 35%, LV grossly severely decreased function, mild LVH, and grade 1 diastolic dysfunction.  Troponin peak 2110.  He was not a candidate for cath due to AMS and acute renal failure. He was started on losartan and spironolactone however, medications were DC'd due to his acute renal failure 07/27/2019. BUN 51 and Creat 3.77 at DC on 08/02/19 and trending down.    He was seen virtually 08/18/2019 for follow-up and stated he  had some nauseousness since returning home from his rehab facility.  He had been busy with activities of daily living but had not resumed his rehab exercises due to his nausea.  He planed to  resume his rehab exercises as soon as he was able.  He stated overall he felt well and had only noticed mild indigestion that is relieved with belching and ambulation.    I  ordered a coronary CTA which was canceled due to his renal status.   The triage nurse line was contacted 09/28/2019 due to increased work of breathing and weight gain.  He was seen by Dr. Allyson Sabal on 09/29/2019.  At that time he was prescribed  furosemide 40 mg daily.  His BMP 10/06/2019 showed a creatinine of 1.33 and a BUN of 31.  He was noticing increased dyspnea on exertion and orthopnea.  He denied chest pain.   He presented to the clinic 10/15/19 for follow-up and stated he was feeling much better after taking increased dose of Lasix.  He had been monitoring his diet and staying away from sodium.  His activity was limited due to his back pain.  I  ordered a BMP , continued his furosemide 40 mg daily, and had him follow-up with me in 2 months.   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  He underwent TURP on 10/26/2019 his Foley was removed and he was unable to void.  He was sent home with SP tube to light drainage bag.   He presented to the clinic 12/06/2019 and stated he felt well.  He did state that he did not really like, but was tolerating his SP tube.  He hoped that it may be reversed.  He continued to be compliant with low-sodium diet and stated he had been slowly increasing his physical activity.     He had been hospitalized 12/21 with abdominal pain and underwent cholecystectomy.  He was last seen in follow-up by Dr. Allyson Sabal 01/30/2022.  During that time he continued to be stable from a cardiac standpoint he did note some dyspnea but denied chest pain.  He presents to the clinic today for follow-up evaluation and states he continues to be challenged in his physical activity due to back pain.  His pain is managed by pain management.  He reports that he was previously taking an increased dose of pain medication but had an opiate attack which affected his mentation.  Since that time he has been taking a much reduced rate of pain medication.  His blood pressure today is 98/50.  He denies lightheadedness, presyncope and syncope.  We reviewed his cardiomyopathy and aortic dilation.  He expressed understanding.  I will repeat his echocardiogram and plan follow-up in 6 to 9  months.   Today he denies chest pain, shortness of breath, lower extremity edema, fatigue, palpitations, melena, hematuria, hemoptysis, diaphoresis, weakness, presyncope, syncope, orthopnea, and PND.  Home Medications    Prior to Admission medications   Medication Sig Start Date End Date Taking? Authorizing Provider  acetaminophen (TYLENOL) 325 MG tablet Take 2 tablets (650 mg total) by mouth every 6 (six) hours as needed for mild pain (or temp > 100). 08/08/20   Marguerita Merles Latif, DO  acyclovir (ZOVIRAX) 400 MG tablet TAKE 1 TABLET BY MOUTH TWICE A DAY 11/22/22   Josph Macho, MD  allopurinol (ZYLOPRIM) 100 MG tablet TAKE 1 TABLET BY MOUTH EVERY DAY Patient taking differently: Take 100 mg by mouth daily. 03/16/19   York Spaniel, MD  aspirin 81 MG chewable tablet Chew 1 tablet (81 mg total) by mouth daily. 08/02/19   Swayze, Ava, DO  atorvastatin (LIPITOR) 40 MG  tablet Take 1 tablet (40 mg total) by mouth daily at 6 PM. Patient taking differently: Take 40 mg by mouth at bedtime. 08/02/19   Swayze, Ava, DO  carvedilol (COREG) 6.25 MG tablet Take 1 tablet by mouth 2 (two) times daily. 04/25/20   [provider]  colchicine 0.6 MG tablet  07/18/20   [provider]  diphenhydramine-acetaminophen (TYLENOL PM) 25-500 MG TABS tablet Take 1 tablet by mouth at bedtime.    [provider]  finasteride (PROSCAR) 5 MG tablet Take 1 tablet (5 mg total) by mouth daily. For urination 07/27/19   Loletta Parish., MD  furosemide (LASIX) 40 MG tablet TAKE 1 TABLET BY MOUTH EVERY DAY 06/14/22   Runell Gess, MD  HYDROcodone-acetaminophen (NORCO) 7.5-325 MG tablet Take 1 tablet by mouth 4 (four) times daily as needed for moderate pain. 08/15/22   [provider]  LORazepam (ATIVAN) 0.5 MG tablet TAKE 1 TABLET (0.5 MG TOTAL) BY MOUTH EVERY 6 (SIX) HOURS AS NEEDED (NAUSEA OR VOMITING). 11/22/22   Josph Macho, MD  losartan (COZAAR) 25 MG tablet Take 25 mg by  mouth daily. 08/20/22   [provider]  MOVANTIK 25 MG TABS tablet Take 25 mg by mouth daily. 08/01/21   [provider]  OLANZapine (ZYPREXA) 5 MG tablet Take 1 tablet (5 mg total) by mouth daily. 08/02/19   Swayze, Ava, DO  ondansetron (ZOFRAN ODT) 4 MG disintegrating tablet Take 1 tablet (4 mg total) by mouth every 8 (eight) hours as needed for nausea or vomiting. 08/03/20   Milagros Loll, MD  pantoprazole (PROTONIX) 40 MG tablet Take 1 tablet (40 mg total) by mouth at bedtime. Patient taking differently: Take 40 mg by mouth daily. 08/02/19   Swayze, Ava, DO  polyethylene glycol (MIRALAX / GLYCOLAX) 17 g packet Take 17 g by mouth daily. Patient taking differently: Take 17 g by mouth daily as needed (constipation). 08/02/19   Swayze, Ava, DO  pregabalin (LYRICA) 150 MG capsule Take 150 mg by mouth 2 (two) times daily. 06/01/20   [provider]  traZODone (DESYREL) 50 MG tablet Take 50 mg by mouth at bedtime. 05/17/19   [provider]  vitamin B-12 (CYANOCOBALAMIN) 500 MCG tablet Take 500 mcg by mouth daily.    [provider]  prochlorperazine (COMPAZINE) 10 MG tablet Take 1 tablet (10 mg total) by mouth every 6 (six) hours as needed (Nausea or vomiting). 08/24/21 04/10/22  Josph Macho, MD    Family History    Family History  Problem Relation Age of Onset   Cancer Mother    Heart attack Father    He indicated that his mother is deceased. He indicated that his father is deceased. He indicated that both of his sisters are alive.  Social History    Social History   Socioeconomic History   Marital status: Married    Spouse name: Not on file   Number of children: 3   Years of education: 12   Highest education level: Not on file  Occupational History   Occupation: retired  Tobacco Use   Smoking status: Former    Packs/day: 1.00    Years: 20.00    Additional pack years: 0.00    Total pack years: 20.00    Types: Cigarettes     Quit date: 1976    Years since quitting: 48.4   Smokeless tobacco: Former    Quit date: 12/12/1979   Tobacco comments:  quit 45 years ago  Vaping Use   Vaping Use: Never used  Substance and Sexual Activity   Alcohol use: No   Drug use: No   Sexual activity: Not on file  Other Topics Concern   Not on file  Social History Narrative   Lives with wife   Caffeine use: Drinks 6 cups coffee per day   Right handed    Social Determinants of Health   Financial Resource Strain: Not on file  Food Insecurity: Not on file  Transportation Needs: Not on file  Physical Activity: Not on file  Stress: Not on file  Social Connections: Not on file  Intimate Partner Violence: Not on file     Review of Systems    General:  No chills, fever, night sweats or weight changes.  Cardiovascular:  No chest pain, dyspnea on exertion, edema, orthopnea, palpitations, paroxysmal nocturnal dyspnea. Dermatological: No rash, lesions/masses Respiratory: No cough, dyspnea Urologic: No hematuria, dysuria Abdominal:   No nausea, vomiting, diarrhea, bright red blood per rectum, melena, or hematemesis Neurologic:  No visual changes, wkns, changes in mental status. All other systems reviewed and are otherwise negative except as noted above.  Physical Exam    VS:  BP (!) 98/50 (BP Location: Left Arm, Patient Position: Sitting, Cuff Size: Normal)   Pulse (!) 58   Ht 5\' 7"  (1.702 m)   Wt 194 lb (88 kg)   BMI 30.38 kg/m  , BMI Body mass index is 30.38 kg/m. GEN: Well nourished, well developed, in no acute distress. HEENT: normal. Neck: Supple, no JVD, carotid bruits, or masses. Cardiac: RRR, no murmurs, rubs, or gallops. No clubbing, cyanosis, edema.  Radials/DP/PT 2+ and equal bilaterally.  Respiratory:  Respirations regular and unlabored, clear to auscultation bilaterally. GI: Soft, nontender, nondistended, BS + x 4. MS: no deformity or atrophy. Skin: warm and dry, no rash. Neuro:  Strength and sensation  are intact. Psych: Normal affect.  Accessory Clinical Findings    Recent Labs: 01/16/2023: ALT 11; BUN 22; Creatinine 1.01; Hemoglobin 12.1; Platelet Count 75; Potassium 3.7; Sodium 142   Recent Lipid Panel    Component Value Date/Time   CHOL 103 09/03/2019 0822   TRIG 57 09/03/2019 0822   HDL 58 09/03/2019 0822   CHOLHDL 1.8 09/03/2019 0822   LDLCALC 32 09/03/2019 0822         ECG personally reviewed by me today-sinus bradycardia with first-degree AV block 58 bpm- No acute changes  EKG 10/15/2019 Normal sinus rhythm 76 bpm no ST or T wave deviation   EKG 07/24/2019 Sinus rhythm with PVCs ST and T wave abnormalities consider lateral ischemia, QT prolongation 100 bpm   Echocardiogram 07/14/2019 IMPRESSIONS      1. Technically difficult study. Left ventricular ejection fraction, by visual estimation, is 30 to 35%. The left ventricle has grossly severely decreased function. Images are not sufficient to assess for regional wall motion abnormalities. There is  mildly increased left ventricular hypertrophy.  2. Left ventricular diastolic parameters are consistent with Grade I diastolic dysfunction (impaired relaxation).  3. Global right ventricle has normal systolic function.The right ventricular size is normal.  4. The mitral valve is normal in structure. No evidence of mitral valve regurgitation.  5. The tricuspid valve is not well visualized. Tricuspid valve regurgitation is not demonstrated.  6. The aortic valve was not well visualized. Aortic valve regurgitation is not visualized.  7. The pulmonic valve was not well visualized. Pulmonic valve regurgitation is not visualized.  Nuclear stress stress 10/20/2015 The left ventricular ejection fraction is normal (55-65%). Nuclear stress EF: 57%. There was no ST segment deviation noted during stress. The study is normal. This is a low risk study.   Normal pharmacologic nuclear stress test with no prior infarct and no  ischemia.  Echocardiogram 03/20/2020  IMPRESSIONS     1. Left ventricular ejection fraction, by estimation, is 40 to 45%. The  left ventricle has mildly decreased function. The left ventricle  demonstrates global hypokinesis. There is mild left ventricular  hypertrophy. Left ventricular diastolic parameters  are consistent with Grade I diastolic dysfunction (impaired relaxation).   2. Right ventricular systolic function is normal. The right ventricular  size is normal. There is normal pulmonary artery systolic pressure.   3. Left atrial size was mildly dilated.   4. The mitral valve is normal in structure. Trivial mitral valve  regurgitation. No evidence of mitral stenosis.   5. The aortic valve is normal in structure. Aortic valve regurgitation is  mild. Mild aortic valve sclerosis is present, with no evidence of aortic  valve stenosis.   6. Aortic dilatation noted. There is mild dilatation of the ascending  aorta measuring 41 mm.   7. The inferior vena cava is normal in size with greater than 50%  respiratory variability, suggesting right atrial pressure of 3 mmHg.   FINDINGS   Left Ventricle: Left ventricular ejection fraction, by estimation, is 40  to 45%. The left ventricle has mildly decreased function. The left  ventricle demonstrates global hypokinesis. The left ventricular internal  cavity size was normal in size. There is   mild left ventricular hypertrophy. Left ventricular diastolic parameters  are consistent with Grade I diastolic dysfunction (impaired relaxation).   Right Ventricle: The right ventricular size is normal. No increase in  right ventricular wall thickness. Right ventricular systolic function is  normal. There is normal pulmonary artery systolic pressure. The tricuspid  regurgitant velocity is 1.93 m/s, and   with an assumed right atrial pressure of 10 mmHg, the estimated right  ventricular systolic pressure is 24.9 mmHg.   Left Atrium: Left atrial size  was mildly dilated.   Right Atrium: Right atrial size was normal in size.   Pericardium: There is no evidence of pericardial effusion.   Mitral Valve: The mitral valve is normal in structure. Normal mobility of  the mitral valve leaflets. Trivial mitral valve regurgitation. No evidence  of mitral valve stenosis.   Tricuspid Valve: The tricuspid valve is normal in structure. Tricuspid  valve regurgitation is trivial. No evidence of tricuspid stenosis.   Aortic Valve: The aortic valve is normal in structure. Aortic valve  regurgitation is mild. Mild aortic valve sclerosis is present, with no  evidence of aortic valve stenosis.   Pulmonic Valve: The pulmonic valve was normal in structure. Pulmonic valve  regurgitation is trivial. No evidence of pulmonic stenosis.   Aorta: Aortic dilatation noted. There is mild dilatation of the ascending  aorta measuring 41 mm.   Venous: The inferior vena cava is normal in size with greater than 50%  respiratory variability, suggesting right atrial pressure of 3 mmHg.   IAS/Shunts: No atrial level shunt detected by color flow Doppler.    Assessment & Plan   1.   Dilated cardiomyopathy-Notes occasional episodes of labored breathing that are short-lived and dissipate without intervention.  Echocardiogram 07/14/2019 showed an LVEF of 30-35% with mild LV H and grade 1 diastolic dysfunction.  07/27/2019 losartan and spironolactone DC'd due to  acute kidney injury.  Furosemide 40 mg daily started on 09/28/2018 for increased work of breathing/oxygen desaturation/weight gain.  Echocardiogram 03/20/2020 showed improved EF of 40-45% and G1 DD with ascending aorta measuring 41 mm Continue furosemide, carvedilol, losartan Heart healthy low-sodium diet Continue Daily weights Elevate lower extremities when not active Lower extremity support stockings Chair exercises given  Ascending aortic Aneurysm-denies recent episodes of back or chest discomfort.  Noted to be 41  mm on last echo. Maintain good B/P control Repeat echocardiogram  Coronary artery disease-denies recent episodes of arm neck or back discomfort.  07/16/2019 EKG showed deep T wave inversion in inferior and anterolateral leads.  07/24/2019 EKG showed sinus rhythm with PVCs ST and T wave abnormalities consider lateral ischemia, QT prolongation 100 bpm.  At bedtime troponin peaked at 2110 (07/15/2019).  Echocardiogram 07/14/2019 showed an LVEF of 30-35% with mild LV H and grade 1 diastolic dysfunction.  Unable to undergo cath during 12/20 hospital admission due to AMS, sepsis, and AKI.  Continue current medical therapy Increase physical activity as tolerated   CKD stage IIIa-creatinine 1.01 on 01/16/2023.   Follows with PCP    disposition: Follow-up with Dr. Allyson Sabal or me in 6-9 months.   Thomasene Ripple. Kerryn Tennant NP-C     01/27/2023, 10:52 AM [facility] [address] Office [phone] Fax [phone]    I spent 14 minutes examining this patient, reviewing medications, and using patient centered shared decision making involving her cardiac care.  Prior to her visit I spent greater than 20 minutes reviewing her past medical history,  medications, and prior cardiac tests.

## 2023-01-27 ENCOUNTER — Encounter: Payer: Self-pay | Admitting: General Practice

## 2023-01-27 ENCOUNTER — Ambulatory Visit: Payer: Medicare PPO | Attending: General Practice | Admitting: General Practice

## 2023-01-27 VITALS — BP 98/50 | HR 58 | Ht 67.0 in | Wt 194.0 lb

## 2023-01-27 DIAGNOSIS — I2510 Atherosclerotic heart disease of native coronary artery without angina pectoris: Secondary | ICD-10-CM | POA: Diagnosis not present

## 2023-01-27 DIAGNOSIS — I5022 Chronic systolic (congestive) heart failure: Secondary | ICD-10-CM

## 2023-01-27 DIAGNOSIS — I7121 Aneurysm of the ascending aorta, without rupture: Secondary | ICD-10-CM | POA: Diagnosis not present

## 2023-01-27 NOTE — Patient Instructions (Signed)
Medication Instructions:  The current medical regimen is effective;  continue present plan and medications as directed. Please refer to the Current Medication list given to you today.  *If you need a refill on your cardiac medications before your next appointment, please call your pharmacy*  Lab Work: NONE If you have labs (blood work) drawn today and your tests are completely normal, you will receive your results only by: MyChart Message (if you have MyChart) OR A paper copy in the mail If you have any lab test that is abnormal or we need to change your treatment, we will call you to review the results.  Testing/Procedures: Echocardiogram - Your physician has requested that you have an echocardiogram. Echocardiography is a painless test that uses sound waves to create images of your heart. It provides your doctor with information about the size and shape of your heart and how well your heart's chambers and valves are working. This procedure takes approximately one hour. There are no restrictions for this procedure.   Follow-Up: At [facility], you and your health needs are our priority.  As part of our continuing mission to provide you with exceptional heart care, we have created designated Provider Care Teams.  These Care Teams include your primary Cardiologist (physician) and Advanced Practice Providers (APPs -  Physician Assistants and Nurse Practitioners) who all work together to provide you with the care you need, when you need it.  We recommend signing up for the patient portal called "MyChart".  Sign up information is provided on this After Visit Summary.  MyChart is used to connect with patients for Virtual Visits (Telemedicine).  Patients are able to view lab/test results, encounter notes, upcoming appointments, etc.  Non-urgent messages can be sent to your provider as well.   To learn more about what you can do with MyChart, go to [email or web address].    Your next  appointment:   6-9 month(s)  Provider:   Nanetta Batty, MD     Other Instructions  DASH Eating Plan DASH stands for Dietary Approaches to Stop Hypertension. The DASH eating plan is a healthy eating plan that has been shown to: Lower high blood pressure (hypertension). Reduce your risk for type 2 diabetes, heart disease, and stroke. Help with weight loss. What are tips for following this plan? Reading food labels Check food labels for the amount of salt (sodium) per serving. Choose foods with less than 5 percent of the Daily Value (DV) of sodium. In general, foods with less than 300 milligrams (mg) of sodium per serving fit into this eating plan. To find whole grains, look for the word "whole" as the first word in the ingredient list. Shopping Buy products labeled as "low-sodium" or "no salt added." Buy fresh foods. Avoid canned foods and pre-made or frozen meals. Cooking Try not to add salt when you cook. Use salt-free seasonings or herbs instead of table salt or sea salt. Check with your health care provider or pharmacist before using salt substitutes. Do not fry foods. Cook foods in healthy ways, such as baking, boiling, grilling, roasting, or broiling. Cook using oils that are good for your heart. These include olive, canola, avocado, soybean, and sunflower oil. Meal planning  Eat a balanced diet. This should include: 4 or more servings of fruits and 4 or more servings of vegetables each day. Try to fill half of your plate with fruits and vegetables. 6-8 servings of whole grains each day. 6 or less servings of lean meat, poultry, or  fish each day. 1 oz is 1 serving. A 3 oz (85 g) serving of meat is about the same size as the palm of your hand. One egg is 1 oz (28 g). 2-3 servings of low-fat dairy each day. One serving is 1 cup (237 mL). 1 serving of nuts, seeds, or beans 5 times each week. 2-3 servings of heart-healthy fats. Healthy fats called omega-3 fatty acids are found in  foods such as walnuts, flaxseeds, fortified milks, and eggs. These fats are also found in cold-water fish, such as sardines, salmon, and mackerel. Limit how much you eat of: Canned or prepackaged foods. Food that is high in trans fat, such as fried foods. Food that is high in saturated fat, such as fatty meat. Desserts and other sweets, sugary drinks, and other foods with added sugar. Full-fat dairy products. Do not salt foods before eating. Do not eat more than 4 egg yolks a week. Try to eat at least 2 vegetarian meals a week. Eat more home-cooked food and less restaurant, buffet, and fast food. Lifestyle When eating at a restaurant, ask if your food can be made with less salt or no salt. If you drink alcohol: Limit how much you have to: 0-1 drink a day if you are male. 0-2 drinks a day if you are male. Know how much alcohol is in your drink. In the [place] one drink is one 12 oz bottle of beer (355 mL), one 5 oz glass of wine (148 mL), or one 1 oz glass of hard liquor (44 mL). General information Avoid eating more than 2,300 mg of salt a day. If you have hypertension, you may need to reduce your sodium intake to 1,500 mg a day. Work with your provider to stay at a healthy body weight or lose weight. Ask what the best weight range is for you. On most days of the week, get at least 30 minutes of exercise that causes your heart to beat faster. This may include walking, swimming, or biking. Work with your provider or dietitian to adjust your eating plan to meet your specific calorie needs. What foods should I eat? Fruits All fresh, dried, or frozen fruit. Canned fruits that are in their natural juice and do not have sugar added to them. Vegetables Fresh or frozen vegetables that are raw, steamed, roasted, or grilled. Low-sodium or reduced-sodium tomato and vegetable juice. Low-sodium or reduced-sodium tomato sauce and tomato paste. Low-sodium or reduced-sodium canned  vegetables. Grains Whole-grain or whole-wheat bread. Whole-grain or whole-wheat pasta. Brown rice. Orpah Cobb. Bulgur. Whole-grain and low-sodium cereals. Pita bread. Low-fat, low-sodium crackers. Whole-wheat flour tortillas. Meats and other proteins Skinless chicken or [place]. Ground chicken or [place]. Pork with fat trimmed off. Fish and seafood. Egg whites. Dried beans, peas, or lentils. Unsalted nuts, nut butters, and seeds. Unsalted canned beans. Lean cuts of beef with fat trimmed off. Low-sodium, lean precooked or cured meat, such as sausages or meat loaves. Dairy Low-fat (1%) or fat-free (skim) milk. Reduced-fat, low-fat, or fat-free cheeses. Nonfat, low-sodium ricotta or cottage cheese. Low-fat or nonfat yogurt. Low-fat, low-sodium cheese. Fats and oils Soft margarine without trans fats. Vegetable oil. Reduced-fat, low-fat, or light mayonnaise and salad dressings (reduced-sodium). Canola, safflower, olive, avocado, soybean, and sunflower oils. Avocado. Seasonings and condiments Herbs. Spices. Seasoning mixes without salt. Other foods Unsalted popcorn and pretzels. Fat-free sweets. The items listed above may not be all the foods and drinks you can have. Talk to a dietitian to learn more. What foods should  I avoid? Fruits Canned fruit in a light or heavy syrup. Fried fruit. Fruit in cream or butter sauce. Vegetables Creamed or fried vegetables. Vegetables in a cheese sauce. Regular canned vegetables that are not marked as low-sodium or reduced-sodium. Regular canned tomato sauce and paste that are not marked as low-sodium or reduced-sodium. Regular tomato and vegetable juices that are not marked as low-sodium or reduced-sodium. Rosita Fire. Olives. Grains Baked goods made with fat, such as croissants, muffins, or some breads. Dry pasta or rice meal packs. Meats and other proteins Fatty cuts of meat. Ribs. Fried meat. Tomasa Blase. Bologna, salami, and other precooked or cured meats, such as  sausages or meat loaves, that are not lean and low in sodium. Fat from the back of a pig (fatback). Bratwurst. Salted nuts and seeds. Canned beans with added salt. Canned or smoked fish. Whole eggs or egg yolks. Chicken or [place] with skin. Dairy Whole or 2% milk, cream, and half-and-half. Whole or full-fat cream cheese. Whole-fat or sweetened yogurt. Full-fat cheese. Nondairy creamers. Whipped toppings. Processed cheese and cheese spreads. Fats and oils Butter. Stick margarine. Lard. Shortening. Ghee. Bacon fat. Tropical oils, such as coconut, palm kernel, or palm oil. Seasonings and condiments Onion salt, garlic salt, seasoned salt, table salt, and sea salt. Worcestershire sauce. Tartar sauce. Barbecue sauce. Teriyaki sauce. Soy sauce, including reduced-sodium soy sauce. Steak sauce. Canned and packaged gravies. Fish sauce. Oyster sauce. Cocktail sauce. Store-bought horseradish. Ketchup. Mustard. Meat flavorings and tenderizers. Bouillon cubes. Hot sauces. Pre-made or packaged marinades. Pre-made or packaged taco seasonings. Relishes. Regular salad dressings. Other foods Salted popcorn and pretzels. The items listed above may not be all the foods and drinks you should avoid. Talk to a dietitian to learn more. Where to find more information National Heart, Lung, and Blood Institute (NHLBI): BuffaloDryCleaner.gl American Heart Association (AHA): heart.org Academy of Nutrition and Dietetics: eatright.org National Kidney Foundation (NKF): kidney.org This information is not intended to replace advice given to you by your health care provider. Make sure you discuss any questions you have with your health care provider. Document Revised: 08/15/2022 Document Reviewed: 08/15/2022 Elsevier Patient Education  2024 Elsevier Inc.   Exercises to do While Sitting  Exercises that you do while sitting (chair exercises) can give you many of the same benefits as full exercise. Benefits include strengthening your  heart, burning calories, and keeping muscles and joints healthy. Exercise can also improve your mood and help with depression and anxiety. You may benefit from chair exercises if you are unable to do standing exercises due to: Diabetic foot pain. Obesity. Illness. Arthritis. Recovery from surgery or injury. Breathing problems. Balance problems. Another type of disability. Before starting chair exercises, check with your health care provider or a physical therapist to find out how much exercise you can tolerate and which exercises are safe for you. If your health care provider approves: Start out slowly and build up over time. Aim to work up to about 10-20 minutes for each exercise session. Make exercise part of your daily routine. Drink water when you exercise. Do not wait until you are thirsty. Drink every 10-15 minutes. Stop exercising right away if you have pain, nausea, shortness of breath, or dizziness. If you are exercising in a wheelchair, make sure to lock the wheels. Ask your health care provider whether you can do tai chi or yoga. Many positions in these mind-body exercises can be modified to do while seated. Warm-up Before starting other exercises: Sit up as straight as you can.  Have your knees bent at 90 degrees, which is the shape of the capital letter "L." Keep your feet flat on the floor. Sit at the front edge of your chair, if you can. Pull in (tighten) the muscles in your abdomen and stretch your spine and neck as straight as you can. Hold this position for a few minutes. Breathe in and out evenly. Try to concentrate on your breathing, and relax your mind. Stretching Exercise A: Arm stretch Hold your arms out straight in front of your body. Bend your hands at the wrist with your fingers pointing up, as if signaling someone to stop. Notice the slight tension in your forearms as you hold the position. Keeping your arms out and your hands bent, rotate your hands outward as  far as you can and hold this stretch. Aim to have your thumbs pointing up and your pinkie fingers pointing down. Slowly repeat arm stretches for one minute as tolerated. Exercise B: Leg stretch If you can move your legs, try to "draw" letters on the floor with the toes of your foot. Write your name with one foot. Write your name with the toes of your other foot. Slowly repeat the movements for one minute as tolerated. Exercise C: Reach for the sky Reach your hands as far over your head as you can to stretch your spine. Move your hands and arms as if you are climbing a rope. Slowly repeat the movements for one minute as tolerated. Range of motion exercises Exercise A: Shoulder roll Let your arms hang loosely at your sides. Lift just your shoulders up toward your ears, then let them relax back down. When your shoulders feel loose, rotate your shoulders in backward and forward circles. Do shoulder rolls slowly for one minute as tolerated. Exercise B: March in place As if you are marching, pump your arms and lift your legs up and down. Lift your knees as high as you can. If you are unable to lift your knees, just pump your arms and move your ankles and feet up and down. March in place for one minute as tolerated. Exercise C: Seated jumping jacks Let your arms hang down straight. Keeping your arms straight, lift them up over your head. Aim to point your fingers to the ceiling. While you lift your arms, straighten your legs and slide your heels along the floor to your sides, as wide as you can. As you bring your arms back down to your sides, slide your legs back together. If you are unable to use your legs, just move your arms. Slowly repeat seated jumping jacks for one minute as tolerated. Strengthening exercises Exercise A: Shoulder squeeze Hold your arms straight out from your body to your sides, with your elbows bent and your fists pointed at the ceiling. Keeping your arms in the bent  position, move them forward so your elbows and forearms meet in front of your face. Open your arms back out as wide as you can with your elbows still bent, until you feel your shoulder blades squeezing together. Hold for 5 seconds. Slowly repeat the movements forward and backward for one minute as tolerated. Contact a health care provider if: You have to stop exercising due to any of the following: Pain. Nausea. Shortness of breath. Dizziness. Fatigue. You have significant pain or soreness after exercising. Get help right away if: You have chest pain. You have difficulty breathing. These symptoms may represent a serious problem that is an emergency. Do not wait to  see if the symptoms will go away. Get medical help right away. Call your local emergency services (911 in the [place] Do not drive yourself to the hospital. Summary Exercises that you do while sitting (chair exercises) can strengthen your heart, burn calories, and keep muscles and joints healthy. You may benefit from chair exercises if you are unable to do standing exercises due to diabetic foot pain, obesity, recovery from surgery or injury, or other conditions. Before starting chair exercises, check with your health care provider or a physical therapist to find out how much exercise you can tolerate and which exercises are safe for you. This information is not intended to replace advice given to you by your health care provider. Make sure you discuss any questions you have with your health care provider. Document Revised: 09/24/2020 Document Reviewed: 09/24/2020 Elsevier Patient Education  2024 [employer].

## 2023-01-28 ENCOUNTER — Other Ambulatory Visit: Payer: Self-pay

## 2023-02-06 ENCOUNTER — Other Ambulatory Visit: Payer: Self-pay

## 2023-02-06 ENCOUNTER — Encounter: Payer: Self-pay | Admitting: Medical Oncology

## 2023-02-06 ENCOUNTER — Inpatient Hospital Stay (HOSPITAL_BASED_OUTPATIENT_CLINIC_OR_DEPARTMENT_OTHER): Payer: Medicare PPO | Admitting: Medical Oncology

## 2023-02-06 ENCOUNTER — Inpatient Hospital Stay: Payer: Medicare PPO

## 2023-02-06 VITALS — BP 106/63 | HR 53 | Temp 97.6°F | Resp 19 | Ht 67.0 in | Wt 192.1 lb

## 2023-02-06 DIAGNOSIS — R1013 Epigastric pain: Secondary | ICD-10-CM

## 2023-02-06 DIAGNOSIS — I420 Dilated cardiomyopathy: Secondary | ICD-10-CM

## 2023-02-06 DIAGNOSIS — C9000 Multiple myeloma not having achieved remission: Secondary | ICD-10-CM

## 2023-02-06 DIAGNOSIS — R339 Retention of urine, unspecified: Secondary | ICD-10-CM

## 2023-02-06 DIAGNOSIS — E43 Unspecified severe protein-calorie malnutrition: Secondary | ICD-10-CM

## 2023-02-06 DIAGNOSIS — G9341 Metabolic encephalopathy: Secondary | ICD-10-CM

## 2023-02-06 DIAGNOSIS — Z7189 Other specified counseling: Secondary | ICD-10-CM

## 2023-02-06 DIAGNOSIS — I5022 Chronic systolic (congestive) heart failure: Secondary | ICD-10-CM

## 2023-02-06 DIAGNOSIS — R638 Other symptoms and signs concerning food and fluid intake: Secondary | ICD-10-CM

## 2023-02-06 DIAGNOSIS — I249 Acute ischemic heart disease, unspecified: Secondary | ICD-10-CM

## 2023-02-06 DIAGNOSIS — G9340 Encephalopathy, unspecified: Secondary | ICD-10-CM

## 2023-02-06 DIAGNOSIS — D472 Monoclonal gammopathy: Secondary | ICD-10-CM

## 2023-02-06 DIAGNOSIS — G894 Chronic pain syndrome: Secondary | ICD-10-CM

## 2023-02-06 DIAGNOSIS — E782 Mixed hyperlipidemia: Secondary | ICD-10-CM

## 2023-02-06 DIAGNOSIS — N312 Flaccid neuropathic bladder, not elsewhere classified: Secondary | ICD-10-CM

## 2023-02-06 DIAGNOSIS — K8050 Calculus of bile duct without cholangitis or cholecystitis without obstruction: Secondary | ICD-10-CM

## 2023-02-06 DIAGNOSIS — R410 Disorientation, unspecified: Secondary | ICD-10-CM

## 2023-02-06 DIAGNOSIS — Z978 Presence of other specified devices: Secondary | ICD-10-CM

## 2023-02-06 DIAGNOSIS — R112 Nausea with vomiting, unspecified: Secondary | ICD-10-CM

## 2023-02-06 DIAGNOSIS — N138 Other obstructive and reflux uropathy: Secondary | ICD-10-CM

## 2023-02-06 DIAGNOSIS — I214 Non-ST elevation (NSTEMI) myocardial infarction: Secondary | ICD-10-CM

## 2023-02-06 DIAGNOSIS — G8929 Other chronic pain: Secondary | ICD-10-CM

## 2023-02-06 DIAGNOSIS — N1831 Chronic kidney disease, stage 3a: Secondary | ICD-10-CM

## 2023-02-06 DIAGNOSIS — Z5111 Encounter for antineoplastic chemotherapy: Secondary | ICD-10-CM | POA: Diagnosis not present

## 2023-02-06 DIAGNOSIS — R1011 Right upper quadrant pain: Secondary | ICD-10-CM

## 2023-02-06 DIAGNOSIS — I7121 Aneurysm of the ascending aorta, without rupture: Secondary | ICD-10-CM

## 2023-02-06 DIAGNOSIS — E876 Hypokalemia: Secondary | ICD-10-CM

## 2023-02-06 DIAGNOSIS — I2510 Atherosclerotic heart disease of native coronary artery without angina pectoris: Secondary | ICD-10-CM

## 2023-02-06 DIAGNOSIS — R1110 Vomiting, unspecified: Secondary | ICD-10-CM

## 2023-02-06 DIAGNOSIS — K8020 Calculus of gallbladder without cholecystitis without obstruction: Secondary | ICD-10-CM

## 2023-02-06 LAB — CMP (CANCER CENTER ONLY)
ALT: 11 U/L (ref 0–44)
AST: 22 U/L (ref 15–41)
Albumin: 4.3 g/dL (ref 3.5–5.0)
Alkaline Phosphatase: 86 U/L (ref 38–126)
Anion gap: 6 (ref 5–15)
BUN: 23 mg/dL (ref 8–23)
CO2: 32 mmol/L (ref 22–32)
Calcium: 9.2 mg/dL (ref 8.9–10.3)
Chloride: 102 mmol/L (ref 98–111)
Creatinine: 1.11 mg/dL (ref 0.61–1.24)
GFR, Estimated: >60 mL/min (ref >60)
Glucose, Bld: 109 mg/dL — ABNORMAL HIGH (ref 70–99)
Potassium: 4 mmol/L (ref 3.5–5.1)
Sodium: 140 mmol/L (ref 135–145)
Total Bilirubin: 0.9 mg/dL (ref 0.3–1.2)
Total Protein: 5.9 g/dL — ABNORMAL LOW (ref 6.5–8.1)

## 2023-02-06 LAB — CBC WITH DIFFERENTIAL (CANCER CENTER ONLY)
Abs Immature Granulocytes: 0.02 10*3/uL (ref 0.00–0.07)
Basophils Absolute: 0 10*3/uL (ref 0.0–0.1)
Basophils Relative: 0 %
Eosinophils Absolute: 0.1 10*3/uL (ref 0.0–0.5)
Eosinophils Relative: 3 %
HCT: 34.6 % — ABNORMAL LOW (ref 39.0–52.0)
Hemoglobin: 12.2 g/dL — ABNORMAL LOW (ref 13.0–17.0)
Immature Granulocytes: 1 %
Lymphocytes Relative: 33 %
Lymphs Abs: 1.2 10*3/uL (ref 0.7–4.0)
MCH: 34.5 pg — ABNORMAL HIGH (ref 26.0–34.0)
MCHC: 35.3 g/dL (ref 30.0–36.0)
MCV: 97.7 fL (ref 80.0–100.0)
Monocytes Absolute: 0.3 10*3/uL (ref 0.1–1.0)
Monocytes Relative: 9 %
Neutro Abs: 2 10*3/uL (ref 1.7–7.7)
Neutrophils Relative %: 54 %
Platelet Count: 80 10*3/uL — ABNORMAL LOW (ref 150–400)
RBC: 3.54 MIL/uL — ABNORMAL LOW (ref 4.22–5.81)
RDW: 12.4 % (ref 11.5–15.5)
WBC Count: 3.6 10*3/uL — ABNORMAL LOW (ref 4.0–10.5)
nRBC: 0 % (ref 0.0–0.2)

## 2023-02-06 MED ORDER — DEXAMETHASONE 4 MG PO TABS
20.0000 mg | ORAL_TABLET | Freq: Once | ORAL | Status: AC
  Administered 2023-02-06: 20 mg via ORAL
  Filled 2023-02-06: qty 5

## 2023-02-06 MED ORDER — DARATUMUMAB-HYALURONIDASE-FIHJ 1800-30000 MG-UT/15ML ~~LOC~~ SOLN
1800.0000 mg | Freq: Once | SUBCUTANEOUS | Status: AC
  Administered 2023-02-06: 1800 mg via SUBCUTANEOUS
  Filled 2023-02-06: qty 15

## 2023-02-06 MED ORDER — DIPHENHYDRAMINE HCL 25 MG PO CAPS
25.0000 mg | ORAL_CAPSULE | Freq: Once | ORAL | Status: AC
  Administered 2023-02-06: 25 mg via ORAL
  Filled 2023-02-06: qty 1

## 2023-02-06 MED ORDER — ACETAMINOPHEN 325 MG PO TABS
650.0000 mg | ORAL_TABLET | Freq: Once | ORAL | Status: AC
  Administered 2023-02-06: 650 mg via ORAL
  Filled 2023-02-06: qty 2

## 2023-02-06 NOTE — Patient Instructions (Signed)
[facility]  Discharge Instructions: Thank you for choosing [facility] to provide your oncology and hematology care.   If you have a lab appointment with the Cancer Center, please go directly to the Cancer Center and check in at the registration area.  Wear comfortable clothing and clothing appropriate for easy access to any Portacath or PICC line.   We strive to give you quality time with your provider. You may need to reschedule your appointment if you arrive late (15 or more minutes).  Arriving late affects you and other patients whose appointments are after yours.  Also, if you miss three or more appointments without notifying the office, you may be dismissed from the clinic at the provider's discretion.      For prescription refill requests, have your pharmacy contact our office and allow 72 hours for refills to be completed.    Today you received the following chemotherapy and/or immunotherapy agents Darzalex      To help prevent nausea and vomiting after your treatment, we encourage you to take your nausea medication as directed.  BELOW ARE SYMPTOMS THAT SHOULD BE REPORTED IMMEDIATELY: *FEVER GREATER THAN 100.4 F (38 C) OR HIGHER *CHILLS OR SWEATING *NAUSEA AND VOMITING THAT IS NOT CONTROLLED WITH YOUR NAUSEA MEDICATION *UNUSUAL SHORTNESS OF BREATH *UNUSUAL BRUISING OR BLEEDING *URINARY PROBLEMS (pain or burning when urinating, or frequent urination) *BOWEL PROBLEMS (unusual diarrhea, constipation, pain near the anus) TENDERNESS IN MOUTH AND THROAT WITH OR WITHOUT PRESENCE OF ULCERS (sore throat, sores in mouth, or a toothache) UNUSUAL RASH, SWELLING OR PAIN  UNUSUAL VAGINAL DISCHARGE OR ITCHING   Items with * indicate a potential emergency and should be followed up as soon as possible or go to the Emergency Department if any problems should occur.  Please show the CHEMOTHERAPY ALERT CARD or IMMUNOTHERAPY ALERT CARD at check-in  to the Emergency Department and triage nurse. Should you have questions after your visit or need to cancel or reschedule your appointment, please contact [facility]  [phone] and follow the prompts.  Office hours are 8:00 a.m. to 4:30 p.m. Monday - Friday. Please note that voicemails left after 4:00 p.m. may not be returned until the following business day.  We are closed weekends and major holidays. You have access to a nurse at all times for urgent questions. Please call the main number to the clinic [phone] and follow the prompts.  For any non-urgent questions, you may also contact your provider using MyChart. We now offer e-Visits for anyone 18 and older to request care online for non-urgent symptoms. For details visit [email or web address].   Also download the MyChart app! Go to the app store, search "MyChart", open the app, select , and log in with your MyChart username and password.   

## 2023-02-06 NOTE — Progress Notes (Signed)
Dr Myna Hidalgo aware of platelets of 80. Per MD, hold Velcade today. Ok to treat with darzalex. dph

## 2023-02-06 NOTE — Progress Notes (Signed)
Hematology and Oncology Follow Up Visit  Riley Eaton [id number] Jan 10, 1941 82 y.o. 02/06/2023   Principle Diagnosis:  IgG Kappa myeloma  -- +1q and t(11:14)   Current Therapy:        Velcade/Decadron -- (3 wk on/1 wk off) -- s/p cycle 10 -- started on 08/24/2021 Faspro - s/p cycle 6 -  started on 04/30/2022   Interim History:  Mr. Riley Eaton is here today for follow-up and treatment. He has chronic lower back pain that waxes and wanes and effects his mobility. He uses a rolling walker well.   He reports that he is doing well. No concerns at this time. Tolerating treatment well.  No blood loss, bruising or petechiae.  Earlier this month his M-spike was 0.3 g/dL, IgG level was 469 mg/dL and kappa light chains 3.62 mg/dL.   No fever, chills, n/v, cough, rash, dizziness, SOB, chest pain, palpitations, abdominal pain or changes in bowel or bladder habits.  No swelling, tenderness, numbness or tingling in her extremities.  No appetite and hydration are good.   Wt Readings from Last 3 Encounters:  02/06/23 192 lb 1.3 oz (87.1 kg)  01/27/23 194 lb (88 kg)  01/16/23 193 lb (87.5 kg)     ECOG Performance Status: 1 - Symptomatic but completely ambulatory  Medications:  Allergies as of 02/06/2023   No Known Allergies      Medication List        Accurate as of February 06, 2023  9:16 AM. If you have any questions, ask your nurse or doctor.          acetaminophen 325 MG tablet Commonly known as: TYLENOL Take 2 tablets (650 mg total) by mouth every 6 (six) hours as needed for mild pain (or temp > 100).   acyclovir 400 MG tablet Commonly known as: ZOVIRAX TAKE 1 TABLET BY MOUTH TWICE A DAY   allopurinol 100 MG tablet Commonly known as: ZYLOPRIM TAKE 1 TABLET BY MOUTH EVERY DAY   aspirin 81 MG chewable tablet Chew 1 tablet (81 mg total) by mouth daily.   atorvastatin 40 MG tablet Commonly known as: LIPITOR Take 1 tablet (40 mg total) by mouth daily at 6 PM. What  changed: when to take this   carvedilol 6.25 MG tablet Commonly known as: COREG Take 1 tablet by mouth 2 (two) times daily.   colchicine 0.6 MG tablet   cyanocobalamin 500 MCG tablet Commonly known as: VITAMIN B12 Take 500 mcg by mouth daily.   diphenhydramine-acetaminophen 25-500 MG Tabs tablet Commonly known as: TYLENOL PM Take 1 tablet by mouth at bedtime.   finasteride 5 MG tablet Commonly known as: PROSCAR Take 1 tablet (5 mg total) by mouth daily. For urination   furosemide 40 MG tablet Commonly known as: LASIX TAKE 1 TABLET BY MOUTH EVERY DAY   HYDROcodone-acetaminophen 7.5-325 MG tablet Commonly known as: NORCO Take 1 tablet by mouth 4 (four) times daily as needed for moderate pain.   LORazepam 0.5 MG tablet Commonly known as: ATIVAN TAKE 1 TABLET (0.5 MG TOTAL) BY MOUTH EVERY 6 (SIX) HOURS AS NEEDED (NAUSEA OR VOMITING).   losartan 25 MG tablet Commonly known as: COZAAR Take 25 mg by mouth daily.   Movantik 25 MG Tabs tablet Generic drug: naloxegol oxalate Take 25 mg by mouth daily.   OLANZapine 5 MG tablet Commonly known as: ZYPREXA Take 1 tablet (5 mg total) by mouth daily.   ondansetron 4 MG disintegrating tablet Commonly known as: Zofran ODT Take  1 tablet (4 mg total) by mouth every 8 (eight) hours as needed for nausea or vomiting.   pantoprazole 40 MG tablet Commonly known as: PROTONIX Take 1 tablet (40 mg total) by mouth at bedtime. What changed: when to take this   polyethylene glycol 17 g packet Commonly known as: MIRALAX / GLYCOLAX Take 17 g by mouth daily. What changed:  when to take this reasons to take this   pregabalin 150 MG capsule Commonly known as: LYRICA Take 150 mg by mouth 2 (two) times daily.   traZODone 50 MG tablet Commonly known as: DESYREL Take 50 mg by mouth at bedtime.        Allergies: No Known Allergies  Past Medical History, Surgical history, Social history, and Family History were reviewed and  updated.  Review of Systems: All other 10 point review of systems is negative.   Physical Exam:  height is 5\' 7"  (1.702 m) and weight is 192 lb 1.3 oz (87.1 kg). His oral temperature is 97.6 F (36.4 C). His blood pressure is 106/63 and his pulse is 53 (abnormal). His respiration is 19 and oxygen saturation is 95%.   Wt Readings from Last 3 Encounters:  02/06/23 192 lb 1.3 oz (87.1 kg)  01/27/23 194 lb (88 kg)  01/16/23 193 lb (87.5 kg)   Constitutional: Ambulating well with his rolling walker Ocular: Sclerae unicteric, pupils equal, round and reactive to light Ear-nose-throat: Oropharynx clear, dentition fair Lymphatic: No cervical or supraclavicular adenopathy Lungs no rales or rhonchi, good excursion bilaterally Heart regular rate and rhythm, no murmur appreciated Abd soft, nontender MSK no focal spinal tenderness, no joint edema Neuro: non-focal, well-oriented, appropriate affect  Lab Results  Component Value Date   WBC 3.6 (L) 02/06/2023   HGB 12.2 (L) 02/06/2023   HCT 34.6 (L) 02/06/2023   MCV 97.7 02/06/2023   PLT 80 (L) 02/06/2023   No results found for: "FERRITIN", "IRON", "TIBC", "UIBC", "IRONPCTSAT" Lab Results  Component Value Date   RBC 3.54 (L) 02/06/2023   Lab Results  Component Value Date   KPAFRELGTCHN 36.2 (H) 01/16/2023   LAMBDASER 15.9 01/16/2023   KAPLAMBRATIO 2.28 (H) 01/16/2023   Lab Results  Component Value Date   IGGSERUM 707 01/16/2023   IGA 41 (L) 01/16/2023   IGMSERUM 26 01/16/2023   Lab Results  Component Value Date   TOTALPROTELP 5.8 (L) 01/16/2023   ALBUMINELP 3.8 01/16/2023   A1GS 0.3 01/16/2023   A2GS 0.4 01/16/2023   BETS 0.7 01/16/2023   GAMS 0.6 01/16/2023   MSPIKE 0.3 (H) 01/16/2023   SPEI Comment 01/16/2023     Chemistry      Component Value Date/Time   NA 142 01/16/2023 0857   NA 142 10/15/2019 1500   K 3.7 01/16/2023 0857   CL 104 01/16/2023 0857   CO2 30 01/16/2023 0857   BUN 22 01/16/2023 0857   BUN 25  10/15/2019 1500   CREATININE 1.01 01/16/2023 0857      Component Value Date/Time   CALCIUM 9.1 01/16/2023 0857   ALKPHOS 90 01/16/2023 0857   AST 20 01/16/2023 0857   ALT 11 01/16/2023 0857   BILITOT 1.0 01/16/2023 0857     Impression and Plan: Mr. Riley Eaton is a very pleasant 82 yo caucasian gentleman with IgG Kappa myeloma.   CBC and CMP reviewed-Slightly improved platelets, hemoglobin. He is tolerating treatment well. For this reason we will proceed forward with treatment of Velcade today.   Disposition: Velcade/decadron today RTC 1 month  APP/MD, labs (CBC w/, CMP,SPEP, QIG, Light chains, LDH ), velcade/decadron  Rushie Chestnut, PA-C 6/27/20249:16 AM

## 2023-02-14 ENCOUNTER — Inpatient Hospital Stay: Payer: Medicare PPO

## 2023-02-14 ENCOUNTER — Encounter: Payer: Self-pay | Admitting: Family

## 2023-02-14 ENCOUNTER — Inpatient Hospital Stay: Payer: Medicare PPO | Attending: Hematology & Oncology | Admitting: Family

## 2023-02-14 ENCOUNTER — Other Ambulatory Visit: Payer: Self-pay

## 2023-02-14 VITALS — BP 98/54

## 2023-02-14 VITALS — BP 88/51 | HR 56 | Temp 97.7°F | Resp 17 | Ht 67.0 in | Wt 193.0 lb

## 2023-02-14 DIAGNOSIS — D472 Monoclonal gammopathy: Secondary | ICD-10-CM | POA: Diagnosis not present

## 2023-02-14 DIAGNOSIS — Z5111 Encounter for antineoplastic chemotherapy: Secondary | ICD-10-CM | POA: Diagnosis present

## 2023-02-14 DIAGNOSIS — Z5112 Encounter for antineoplastic immunotherapy: Secondary | ICD-10-CM | POA: Insufficient documentation

## 2023-02-14 DIAGNOSIS — C9000 Multiple myeloma not having achieved remission: Secondary | ICD-10-CM | POA: Diagnosis not present

## 2023-02-14 LAB — CBC WITH DIFFERENTIAL (CANCER CENTER ONLY)
Abs Immature Granulocytes: 0.02 10*3/uL (ref 0.00–0.07)
Basophils Absolute: 0 10*3/uL (ref 0.0–0.1)
Basophils Relative: 0 %
Eosinophils Absolute: 0.1 10*3/uL (ref 0.0–0.5)
Eosinophils Relative: 2 %
HCT: 34.1 % — ABNORMAL LOW (ref 39.0–52.0)
Hemoglobin: 12.1 g/dL — ABNORMAL LOW (ref 13.0–17.0)
Immature Granulocytes: 1 %
Lymphocytes Relative: 31 %
Lymphs Abs: 1.2 10*3/uL (ref 0.7–4.0)
MCH: 34.2 pg — ABNORMAL HIGH (ref 26.0–34.0)
MCHC: 35.5 g/dL (ref 30.0–36.0)
MCV: 96.3 fL (ref 80.0–100.0)
Monocytes Absolute: 0.4 10*3/uL (ref 0.1–1.0)
Monocytes Relative: 10 %
Neutro Abs: 2.2 10*3/uL (ref 1.7–7.7)
Neutrophils Relative %: 56 %
Platelet Count: 79 10*3/uL — ABNORMAL LOW (ref 150–400)
RBC: 3.54 MIL/uL — ABNORMAL LOW (ref 4.22–5.81)
RDW: 12.3 % (ref 11.5–15.5)
WBC Count: 3.9 10*3/uL — ABNORMAL LOW (ref 4.0–10.5)
nRBC: 0 % (ref 0.0–0.2)

## 2023-02-14 LAB — CMP (CANCER CENTER ONLY)
ALT: 14 U/L (ref 0–44)
AST: 26 U/L (ref 15–41)
Albumin: 4.2 g/dL (ref 3.5–5.0)
Alkaline Phosphatase: 85 U/L (ref 38–126)
Anion gap: 5 (ref 5–15)
BUN: 24 mg/dL — ABNORMAL HIGH (ref 8–23)
CO2: 33 mmol/L — ABNORMAL HIGH (ref 22–32)
Calcium: 9.2 mg/dL (ref 8.9–10.3)
Chloride: 102 mmol/L (ref 98–111)
Creatinine: 1.2 mg/dL (ref 0.61–1.24)
GFR, Estimated: >60 mL/min (ref >60)
Glucose, Bld: 115 mg/dL — ABNORMAL HIGH (ref 70–99)
Potassium: 4.5 mmol/L (ref 3.5–5.1)
Sodium: 140 mmol/L (ref 135–145)
Total Bilirubin: 0.9 mg/dL (ref 0.3–1.2)
Total Protein: 6.2 g/dL — ABNORMAL LOW (ref 6.5–8.1)

## 2023-02-14 MED ORDER — PROCHLORPERAZINE MALEATE 10 MG PO TABS
10.0000 mg | ORAL_TABLET | Freq: Once | ORAL | Status: AC
  Administered 2023-02-14: 10 mg via ORAL
  Filled 2023-02-14: qty 1

## 2023-02-14 MED ORDER — BORTEZOMIB CHEMO SQ INJECTION 3.5 MG (2.5MG/ML)
1.3000 mg/m2 | Freq: Once | INTRAMUSCULAR | Status: AC
  Administered 2023-02-14: 2.75 mg via SUBCUTANEOUS
  Filled 2023-02-14: qty 1.1

## 2023-02-14 NOTE — Progress Notes (Signed)
Ok to treat with platelets of 79 per Maralyn Sago, NP. dph

## 2023-02-14 NOTE — Patient Instructions (Signed)
[facility]  Discharge Instructions: Thank you for choosing [facility] to provide your oncology and hematology care.   If you have a lab appointment with the Cancer Center, please go directly to the Cancer Center and check in at the registration area.  Wear comfortable clothing and clothing appropriate for easy access to any Portacath or PICC line.   We strive to give you quality time with your provider. You may need to reschedule your appointment if you arrive late (15 or more minutes).  Arriving late affects you and other patients whose appointments are after yours.  Also, if you miss three or more appointments without notifying the office, you may be dismissed from the clinic at the provider's discretion.      For prescription refill requests, have your pharmacy contact our office and allow 72 hours for refills to be completed.    Today you received the following chemotherapy and/or immunotherapy agents Velcade.      To help prevent nausea and vomiting after your treatment, we encourage you to take your nausea medication as directed.  BELOW ARE SYMPTOMS THAT SHOULD BE REPORTED IMMEDIATELY: *FEVER GREATER THAN 100.4 F (38 C) OR HIGHER *CHILLS OR SWEATING *NAUSEA AND VOMITING THAT IS NOT CONTROLLED WITH YOUR NAUSEA MEDICATION *UNUSUAL SHORTNESS OF BREATH *UNUSUAL BRUISING OR BLEEDING *URINARY PROBLEMS (pain or burning when urinating, or frequent urination) *BOWEL PROBLEMS (unusual diarrhea, constipation, pain near the anus) TENDERNESS IN MOUTH AND THROAT WITH OR WITHOUT PRESENCE OF ULCERS (sore throat, sores in mouth, or a toothache) UNUSUAL RASH, SWELLING OR PAIN  UNUSUAL VAGINAL DISCHARGE OR ITCHING   Items with * indicate a potential emergency and should be followed up as soon as possible or go to the Emergency Department if any problems should occur.  Please show the CHEMOTHERAPY ALERT CARD or IMMUNOTHERAPY ALERT CARD at check-in  to the Emergency Department and triage nurse. Should you have questions after your visit or need to cancel or reschedule your appointment, please contact [facility]  [phone] and follow the prompts.  Office hours are 8:00 a.m. to 4:30 p.m. Monday - Friday. Please note that voicemails left after 4:00 p.m. may not be returned until the following business day.  We are closed weekends and major holidays. You have access to a nurse at all times for urgent questions. Please call the main number to the clinic [phone] and follow the prompts.  For any non-urgent questions, you may also contact your provider using MyChart. We now offer e-Visits for anyone 18 and older to request care online for non-urgent symptoms. For details visit [email or web address].   Also download the MyChart app! Go to the app store, search "MyChart", open the app, select [facility], and log in with your MyChart username and password.   

## 2023-02-14 NOTE — Progress Notes (Signed)
Hematology and Oncology Follow Up Visit  Riley Eaton [id number] [birth year] 82 y.o. 02/14/2023   Principle Diagnosis:  IgG Kappa myeloma  -- +1q and t(11:14)   Current Therapy:        Velcade/Decadron -- (3 wk on/1 wk off) -- s/p cycle 10 -- started on 08/24/2021 Faspro - s/p cycle 6 -  started on 04/30/2022   Interim History:  Riley Eaton is here today for follow-up and treatment. He is doing well outside his chronic back pain. This is unchanged from baseline.  Last month his M-spike was stable at 0.3 g/dL, 409 mg/dL and kappa light chains 3.62 mg/dL. No issue with infection. No fever, chills, n/v, cough, rash, dizziness, SOB, chest pain, palpitations, abdominal pain or changes in bowel or bladder habits.  No swelling in his extremities.  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  No falls or syncope. He is ambulating with a Rolator for added support.  Appetite and hydration have been good per patient. Weight is 193 lbs.   ECOG Performance Status: 1 - Symptomatic but completely ambulatory  Medications:  Allergies as of 02/14/2023   No Known Allergies      Medication List        Accurate as of February 14, 2023 10:45 AM. If you have any questions, ask your nurse or doctor.          acetaminophen 325 MG tablet Commonly known as: TYLENOL Take 2 tablets (650 mg total) by mouth every 6 (six) hours as needed for mild pain (or temp > 100).   acyclovir 400 MG tablet Commonly known as: ZOVIRAX TAKE 1 TABLET BY MOUTH TWICE A DAY   allopurinol 100 MG tablet Commonly known as: ZYLOPRIM TAKE 1 TABLET BY MOUTH EVERY DAY   aspirin 81 MG chewable tablet Chew 1 tablet (81 mg total) by mouth daily.   atorvastatin 40 MG tablet Commonly known as: LIPITOR Take 1 tablet (40 mg total) by mouth daily at 6 PM. What changed: when to take this   carvedilol 6.25 MG tablet Commonly known as: COREG Take 1 tablet by mouth 2 (two) times daily.   colchicine 0.6 MG  tablet   cyanocobalamin 500 MCG tablet Commonly known as: VITAMIN B12 Take 500 mcg by mouth daily.   diphenhydramine-acetaminophen 25-500 MG Tabs tablet Commonly known as: TYLENOL PM Take 1 tablet by mouth at bedtime.   finasteride 5 MG tablet Commonly known as: PROSCAR Take 1 tablet (5 mg total) by mouth daily. For urination   furosemide 40 MG tablet Commonly known as: LASIX TAKE 1 TABLET BY MOUTH EVERY DAY   HYDROcodone-acetaminophen 7.5-325 MG tablet Commonly known as: NORCO Take 1 tablet by mouth 4 (four) times daily as needed for moderate pain.   LORazepam 0.5 MG tablet Commonly known as: ATIVAN TAKE 1 TABLET (0.5 MG TOTAL) BY MOUTH EVERY 6 (SIX) HOURS AS NEEDED (NAUSEA OR VOMITING).   losartan 25 MG tablet Commonly known as: COZAAR Take 25 mg by mouth daily.   Movantik 25 MG Tabs tablet Generic drug: naloxegol oxalate Take 25 mg by mouth daily.   OLANZapine 5 MG tablet Commonly known as: ZYPREXA Take 1 tablet (5 mg total) by mouth daily.   ondansetron 4 MG disintegrating tablet Commonly known as: Zofran ODT Take 1 tablet (4 mg total) by mouth every 8 (eight) hours as needed for nausea or vomiting.   pantoprazole 40 MG tablet Commonly known as: PROTONIX Take 1 tablet (40 mg total) by mouth at  bedtime. What changed: when to take this   polyethylene glycol 17 g packet Commonly known as: MIRALAX / GLYCOLAX Take 17 g by mouth daily. What changed:  when to take this reasons to take this   pregabalin 150 MG capsule Commonly known as: LYRICA Take 150 mg by mouth 2 (two) times daily.   traZODone 50 MG tablet Commonly known as: DESYREL Take 50 mg by mouth at bedtime.        Allergies: No Known Allergies  Past Medical History, Surgical history, Social history, and Family History were reviewed and updated.  Review of Systems: All other 10 point review of systems is negative.   Physical Exam:  vitals were not taken for this visit.   Wt Readings  from Last 3 Encounters:  02/06/23 192 lb 1.3 oz (87.1 kg)  01/27/23 194 lb (88 kg)  01/16/23 193 lb (87.5 kg)    Ocular: Sclerae unicteric, pupils equal, round and reactive to light Ear-nose-throat: Oropharynx clear, dentition fair Lymphatic: No cervical or supraclavicular adenopathy Lungs no rales or rhonchi, good excursion bilaterally Heart regular rate and rhythm, no murmur appreciated Abd soft, nontender, positive bowel sounds MSK no focal spinal tenderness, no joint edema Neuro: non-focal, well-oriented, appropriate affect Breasts: Deferred   Lab Results  Component Value Date   WBC 3.9 (L) 02/14/2023   HGB 12.1 (L) 02/14/2023   HCT 34.1 (L) 02/14/2023   MCV 96.3 02/14/2023   PLT 79 (L) 02/14/2023   No results found for: "FERRITIN", "IRON", "TIBC", "UIBC", "IRONPCTSAT" Lab Results  Component Value Date   RBC 3.54 (L) 02/14/2023   Lab Results  Component Value Date   KPAFRELGTCHN 36.2 (H) 01/16/2023   LAMBDASER 15.9 01/16/2023   KAPLAMBRATIO 2.28 (H) 01/16/2023   Lab Results  Component Value Date   IGGSERUM 707 01/16/2023   IGA 41 (L) 01/16/2023   IGMSERUM 26 01/16/2023   Lab Results  Component Value Date   TOTALPROTELP 5.8 (L) 01/16/2023   ALBUMINELP 3.8 01/16/2023   A1GS 0.3 01/16/2023   A2GS 0.4 01/16/2023   BETS 0.7 01/16/2023   GAMS 0.6 01/16/2023   MSPIKE 0.3 (H) 01/16/2023   SPEI Comment 01/16/2023     Chemistry      Component Value Date/Time   NA 140 02/14/2023 1011   NA 142 10/15/2019 1500   K 4.5 02/14/2023 1011   CL 102 02/14/2023 1011   CO2 33 (H) 02/14/2023 1011   BUN 24 (H) 02/14/2023 1011   BUN 25 10/15/2019 1500   CREATININE 1.20 02/14/2023 1011      Component Value Date/Time   CALCIUM 9.2 02/14/2023 1011   ALKPHOS 85 02/14/2023 1011   AST 26 02/14/2023 1011   ALT 14 02/14/2023 1011   BILITOT 0.9 02/14/2023 1011       Impression and Plan: Riley Eaton is a very pleasant 82 yo caucasian gentleman with IgG Kappa myeloma.  We  will proceed with treatment today as planned.  CBC and CMP remain stable.  Follow-up in 1 month.    Eileen Stanford, NP 7/5/202410:45 AM

## 2023-02-25 ENCOUNTER — Other Ambulatory Visit (HOSPITAL_COMMUNITY): Payer: Self-pay

## 2023-02-25 ENCOUNTER — Ambulatory Visit (HOSPITAL_COMMUNITY): Payer: Medicare PPO | Attending: Cardiology

## 2023-02-25 DIAGNOSIS — I7121 Aneurysm of the ascending aorta, without rupture: Secondary | ICD-10-CM | POA: Insufficient documentation

## 2023-02-25 LAB — ECHOCARDIOGRAM COMPLETE
Area-P 1/2: 3.23 cm2
S' Lateral: 3.6 cm

## 2023-03-06 ENCOUNTER — Inpatient Hospital Stay: Payer: Medicare PPO

## 2023-03-06 VITALS — BP 112/70 | HR 54 | Temp 97.8°F | Resp 19

## 2023-03-06 DIAGNOSIS — D472 Monoclonal gammopathy: Secondary | ICD-10-CM

## 2023-03-06 DIAGNOSIS — Z5111 Encounter for antineoplastic chemotherapy: Secondary | ICD-10-CM | POA: Diagnosis not present

## 2023-03-06 LAB — CMP (CANCER CENTER ONLY)
ALT: 11 U/L (ref 0–44)
AST: 22 U/L (ref 15–41)
Albumin: 4.4 g/dL (ref 3.5–5.0)
Alkaline Phosphatase: 94 U/L (ref 38–126)
Anion gap: 7 (ref 5–15)
BUN: 23 mg/dL (ref 8–23)
CO2: 32 mmol/L (ref 22–32)
Calcium: 9.2 mg/dL (ref 8.9–10.3)
Chloride: 102 mmol/L (ref 98–111)
Creatinine: 1.11 mg/dL (ref 0.61–1.24)
GFR, Estimated: >60 mL/min (ref >60)
Glucose, Bld: 112 mg/dL — ABNORMAL HIGH (ref 70–99)
Potassium: 3.8 mmol/L (ref 3.5–5.1)
Sodium: 141 mmol/L (ref 135–145)
Total Bilirubin: 1.1 mg/dL (ref 0.3–1.2)
Total Protein: 6.1 g/dL — ABNORMAL LOW (ref 6.5–8.1)

## 2023-03-06 LAB — CBC WITH DIFFERENTIAL (CANCER CENTER ONLY)
Abs Immature Granulocytes: 0.01 10*3/uL (ref 0.00–0.07)
Basophils Absolute: 0 10*3/uL (ref 0.0–0.1)
Basophils Relative: 0 %
Eosinophils Absolute: 0.1 10*3/uL (ref 0.0–0.5)
Eosinophils Relative: 3 %
HCT: 35.3 % — ABNORMAL LOW (ref 39.0–52.0)
Hemoglobin: 12.3 g/dL — ABNORMAL LOW (ref 13.0–17.0)
Immature Granulocytes: 0 %
Lymphocytes Relative: 30 %
Lymphs Abs: 1 10*3/uL (ref 0.7–4.0)
MCH: 34.1 pg — ABNORMAL HIGH (ref 26.0–34.0)
MCHC: 34.8 g/dL (ref 30.0–36.0)
MCV: 97.8 fL (ref 80.0–100.0)
Monocytes Absolute: 0.3 10*3/uL (ref 0.1–1.0)
Monocytes Relative: 9 %
Neutro Abs: 1.9 10*3/uL (ref 1.7–7.7)
Neutrophils Relative %: 58 %
Platelet Count: 87 10*3/uL — ABNORMAL LOW (ref 150–400)
RBC: 3.61 MIL/uL — ABNORMAL LOW (ref 4.22–5.81)
RDW: 12.5 % (ref 11.5–15.5)
WBC Count: 3.3 10*3/uL — ABNORMAL LOW (ref 4.0–10.5)
nRBC: 0 % (ref 0.0–0.2)

## 2023-03-06 LAB — LACTATE DEHYDROGENASE: LDH: 362 U/L — ABNORMAL HIGH (ref 98–192)

## 2023-03-06 MED ORDER — DEXAMETHASONE 4 MG PO TABS
20.0000 mg | ORAL_TABLET | Freq: Once | ORAL | Status: AC
  Administered 2023-03-06: 20 mg via ORAL
  Filled 2023-03-06: qty 5

## 2023-03-06 MED ORDER — ACETAMINOPHEN 325 MG PO TABS
650.0000 mg | ORAL_TABLET | Freq: Once | ORAL | Status: AC
  Administered 2023-03-06: 650 mg via ORAL
  Filled 2023-03-06: qty 2

## 2023-03-06 MED ORDER — DARATUMUMAB-HYALURONIDASE-FIHJ 1800-30000 MG-UT/15ML ~~LOC~~ SOLN
1800.0000 mg | Freq: Once | SUBCUTANEOUS | Status: AC
  Administered 2023-03-06: 1800 mg via SUBCUTANEOUS
  Filled 2023-03-06: qty 15

## 2023-03-06 MED ORDER — DIPHENHYDRAMINE HCL 25 MG PO CAPS
25.0000 mg | ORAL_CAPSULE | Freq: Once | ORAL | Status: AC
  Administered 2023-03-06: 25 mg via ORAL
  Filled 2023-03-06: qty 1

## 2023-03-06 MED ORDER — BORTEZOMIB CHEMO SQ INJECTION 3.5 MG (2.5MG/ML)
1.3000 mg/m2 | Freq: Once | INTRAMUSCULAR | Status: AC
  Administered 2023-03-06: 2.75 mg via SUBCUTANEOUS
  Filled 2023-03-06: qty 1.1

## 2023-03-06 NOTE — Progress Notes (Signed)
Ok to treat with platelets of 87 per DR Ennever. dph

## 2023-03-13 ENCOUNTER — Inpatient Hospital Stay: Payer: Medicare PPO

## 2023-03-13 ENCOUNTER — Other Ambulatory Visit: Payer: Self-pay

## 2023-03-13 ENCOUNTER — Inpatient Hospital Stay (HOSPITAL_BASED_OUTPATIENT_CLINIC_OR_DEPARTMENT_OTHER): Payer: Medicare PPO | Admitting: Hematology & Oncology

## 2023-03-13 ENCOUNTER — Encounter: Payer: Self-pay | Admitting: Hematology & Oncology

## 2023-03-13 ENCOUNTER — Inpatient Hospital Stay: Payer: Medicare PPO | Attending: Hematology & Oncology

## 2023-03-13 VITALS — BP 103/56 | HR 56 | Temp 97.8°F | Resp 20 | Ht 67.0 in | Wt 190.4 lb

## 2023-03-13 DIAGNOSIS — D61818 Other pancytopenia: Secondary | ICD-10-CM | POA: Insufficient documentation

## 2023-03-13 DIAGNOSIS — C9000 Multiple myeloma not having achieved remission: Secondary | ICD-10-CM | POA: Insufficient documentation

## 2023-03-13 DIAGNOSIS — M549 Dorsalgia, unspecified: Secondary | ICD-10-CM | POA: Diagnosis not present

## 2023-03-13 DIAGNOSIS — D472 Monoclonal gammopathy: Secondary | ICD-10-CM

## 2023-03-13 LAB — CBC WITH DIFFERENTIAL (CANCER CENTER ONLY)
Abs Immature Granulocytes: 0.02 10*3/uL (ref 0.00–0.07)
Basophils Absolute: 0 10*3/uL (ref 0.0–0.1)
Basophils Relative: 0 %
Eosinophils Absolute: 0.1 10*3/uL (ref 0.0–0.5)
Eosinophils Relative: 2 %
HCT: 35.1 % — ABNORMAL LOW (ref 39.0–52.0)
Hemoglobin: 12.4 g/dL — ABNORMAL LOW (ref 13.0–17.0)
Immature Granulocytes: 1 %
Lymphocytes Relative: 26 %
Lymphs Abs: 1 10*3/uL (ref 0.7–4.0)
MCH: 34.9 pg — ABNORMAL HIGH (ref 26.0–34.0)
MCHC: 35.3 g/dL (ref 30.0–36.0)
MCV: 98.9 fL (ref 80.0–100.0)
Monocytes Absolute: 0.3 10*3/uL (ref 0.1–1.0)
Monocytes Relative: 9 %
Neutro Abs: 2.4 10*3/uL (ref 1.7–7.7)
Neutrophils Relative %: 62 %
Platelet Count: 73 10*3/uL — ABNORMAL LOW (ref 150–400)
RBC: 3.55 MIL/uL — ABNORMAL LOW (ref 4.22–5.81)
RDW: 12.6 % (ref 11.5–15.5)
WBC Count: 3.8 10*3/uL — ABNORMAL LOW (ref 4.0–10.5)
nRBC: 0 % (ref 0.0–0.2)

## 2023-03-13 LAB — CMP (CANCER CENTER ONLY)
ALT: 12 U/L (ref 0–44)
AST: 22 U/L (ref 15–41)
Albumin: 4.2 g/dL (ref 3.5–5.0)
Alkaline Phosphatase: 92 U/L (ref 38–126)
Anion gap: 6 (ref 5–15)
BUN: 21 mg/dL (ref 8–23)
CO2: 32 mmol/L (ref 22–32)
Calcium: 9 mg/dL (ref 8.9–10.3)
Chloride: 103 mmol/L (ref 98–111)
Creatinine: 1.11 mg/dL (ref 0.61–1.24)
GFR, Estimated: >60 mL/min (ref >60)
Glucose, Bld: 134 mg/dL — ABNORMAL HIGH (ref 70–99)
Potassium: 4.4 mmol/L (ref 3.5–5.1)
Sodium: 141 mmol/L (ref 135–145)
Total Bilirubin: 0.9 mg/dL (ref 0.3–1.2)
Total Protein: 6.2 g/dL — ABNORMAL LOW (ref 6.5–8.1)

## 2023-03-13 NOTE — Progress Notes (Signed)
Hematology and Oncology Follow Up Visit  Riley Eaton [id number] Jul 05, 1941 82 y.o. 03/13/2023   Principle Diagnosis:  IgG Kappa myeloma  -- +1q and t(11:14)   Current Therapy:        Velcade/Decadron -- (3 wk on/1 wk off) -- s/p cycle #12 -- started on 08/24/2021 Faspro - s/p cycle 6 -  started on 04/30/2022   Interim History:  Riley Eaton is here today for follow-up and treatment.  He is doing okay.  However, his platelet count is just on the low side.  Given this, I really think we have to give him a break from treatment.  His monoclonal studies have been holding pretty steady.  His last monoclonal spike was 0.3 g/dL.  His IgG level was 731 mg/dL.  His Kappa light chain was 3.8 mg/dL.  He is eating okay.  He is having no problems with nausea or vomiting.  He is having no issues with bowels or bladder.  He has horrible back pain.  He uses Vicodin.  He has had no bleeding.  There is been no leg swelling.  Overall, I would say his performance status is probably ECOG 2.    Medications:  Allergies as of 03/13/2023   No Known Allergies      Medication List        Accurate as of March 13, 2023 10:56 AM. If you have any questions, ask your nurse or doctor.          acetaminophen 325 MG tablet Commonly known as: TYLENOL Take 2 tablets (650 mg total) by mouth every 6 (six) hours as needed for mild pain (or temp > 100).   acyclovir 400 MG tablet Commonly known as: ZOVIRAX TAKE 1 TABLET BY MOUTH TWICE A DAY   allopurinol 100 MG tablet Commonly known as: ZYLOPRIM TAKE 1 TABLET BY MOUTH EVERY DAY   aspirin 81 MG chewable tablet Chew 1 tablet (81 mg total) by mouth daily.   atorvastatin 40 MG tablet Commonly known as: LIPITOR Take 1 tablet (40 mg total) by mouth daily at 6 PM. What changed: when to take this   carvedilol 6.25 MG tablet Commonly known as: COREG Take 1 tablet by mouth 2 (two) times daily.   colchicine 0.6 MG tablet   cyanocobalamin 500 MCG  tablet Commonly known as: VITAMIN B12 Take 500 mcg by mouth daily.   diphenhydramine-acetaminophen 25-500 MG Tabs tablet Commonly known as: TYLENOL PM Take 1 tablet by mouth at bedtime.   finasteride 5 MG tablet Commonly known as: PROSCAR Take 1 tablet (5 mg total) by mouth daily. For urination   furosemide 40 MG tablet Commonly known as: LASIX TAKE 1 TABLET BY MOUTH EVERY DAY   HYDROcodone-acetaminophen 7.5-325 MG tablet Commonly known as: NORCO Take 1 tablet by mouth 4 (four) times daily as needed for moderate pain.   LORazepam 0.5 MG tablet Commonly known as: ATIVAN TAKE 1 TABLET (0.5 MG TOTAL) BY MOUTH EVERY 6 (SIX) HOURS AS NEEDED (NAUSEA OR VOMITING).   losartan 25 MG tablet Commonly known as: COZAAR Take 25 mg by mouth daily.   Movantik 25 MG Tabs tablet Generic drug: naloxegol oxalate Take 25 mg by mouth daily.   OLANZapine 5 MG tablet Commonly known as: ZYPREXA Take 1 tablet (5 mg total) by mouth daily.   ondansetron 4 MG disintegrating tablet Commonly known as: Zofran ODT Take 1 tablet (4 mg total) by mouth every 8 (eight) hours as needed for nausea or vomiting.   pantoprazole  40 MG tablet Commonly known as: PROTONIX Take 1 tablet (40 mg total) by mouth at bedtime. What changed: when to take this   polyethylene glycol 17 g packet Commonly known as: MIRALAX / GLYCOLAX Take 17 g by mouth daily. What changed:  when to take this reasons to take this   pregabalin 150 MG capsule Commonly known as: LYRICA Take 150 mg by mouth 2 (two) times daily.   promethazine 25 MG tablet Commonly known as: PHENERGAN Take 25 mg by mouth every 4 (four) hours as needed.   traZODone 50 MG tablet Commonly known as: DESYREL Take 50 mg by mouth at bedtime.        Allergies: No Known Allergies  Past Medical History, Surgical history, Social history, and Family History were reviewed and updated.  Review of Systems: All other 10 point review of systems is negative.    Physical Exam:  height is 5\' 7"  (1.702 m) and weight is 190 lb 6.4 oz (86.4 kg). His oral temperature is 97.8 F (36.6 C). His blood pressure is 103/56 (abnormal) and his pulse is 56 (abnormal). His respiration is 20 and oxygen saturation is 98%.   Wt Readings from Last 3 Encounters:  03/13/23 190 lb 6.4 oz (86.4 kg)  02/14/23 193 lb (87.5 kg)  02/06/23 192 lb 1.3 oz (87.1 kg)    Ocular: Sclerae unicteric, pupils equal, round and reactive to light Ear-nose-throat: Oropharynx clear, dentition fair Lymphatic: No cervical or supraclavicular adenopathy Lungs no rales or rhonchi, good excursion bilaterally Heart regular rate and rhythm, no murmur appreciated Abd soft, nontender, positive bowel sounds MSK no focal spinal tenderness, no joint edema Neuro: non-focal, well-oriented, appropriate affect Breasts: Deferred   Lab Results  Component Value Date   WBC 3.8 (L) 03/13/2023   HGB 12.4 (L) 03/13/2023   HCT 35.1 (L) 03/13/2023   MCV 98.9 03/13/2023   PLT 73 (L) 03/13/2023   No results found for: "FERRITIN", "IRON", "TIBC", "UIBC", "IRONPCTSAT" Lab Results  Component Value Date   RBC 3.55 (L) 03/13/2023   Lab Results  Component Value Date   KPAFRELGTCHN 38.5 (H) 03/06/2023   LAMBDASER 17.5 03/06/2023   KAPLAMBRATIO 2.20 (H) 03/06/2023   Lab Results  Component Value Date   IGGSERUM 731 03/06/2023   IGA 43 (L) 03/06/2023   IGMSERUM 24 03/06/2023   Lab Results  Component Value Date   TOTALPROTELP 5.9 (L) 03/06/2023   ALBUMINELP 3.9 03/06/2023   A1GS 0.2 03/06/2023   A2GS 0.4 03/06/2023   BETS 0.7 03/06/2023   GAMS 0.7 03/06/2023   MSPIKE 0.3 (H) 03/06/2023   SPEI Comment 03/06/2023     Chemistry      Component Value Date/Time   NA 141 03/13/2023 0950   NA 142 10/15/2019 1500   K 4.4 03/13/2023 0950   CL 103 03/13/2023 0950   CO2 32 03/13/2023 0950   BUN 21 03/13/2023 0950   BUN 25 10/15/2019 1500   CREATININE 1.11 03/13/2023 0950      Component Value  Date/Time   CALCIUM 9.0 03/13/2023 0950   ALKPHOS 92 03/13/2023 0950   AST 22 03/13/2023 0950   ALT 12 03/13/2023 0950   BILITOT 0.9 03/13/2023 0950       Impression and Plan: Riley Eaton is a very pleasant 82 yo caucasian gentleman with IgG Kappa myeloma.   I will give him a break from treatment right now.  I will give him 3 weeks off.  Hopefully this will help with his blood  counts.  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  As long as it stays low level, he should never have a problem.     Josph Macho, MD 8/1/202410:56 AM

## 2023-03-17 ENCOUNTER — Ambulatory Visit: Payer: Medicare PPO

## 2023-03-17 ENCOUNTER — Other Ambulatory Visit: Payer: Medicare PPO

## 2023-03-17 ENCOUNTER — Ambulatory Visit: Payer: Medicare PPO | Admitting: Family

## 2023-04-03 ENCOUNTER — Inpatient Hospital Stay: Payer: Medicare PPO

## 2023-04-03 ENCOUNTER — Encounter: Payer: Self-pay | Admitting: Medical Oncology

## 2023-04-03 ENCOUNTER — Other Ambulatory Visit: Payer: Self-pay

## 2023-04-03 ENCOUNTER — Inpatient Hospital Stay (HOSPITAL_BASED_OUTPATIENT_CLINIC_OR_DEPARTMENT_OTHER): Payer: Medicare PPO | Admitting: Medical Oncology

## 2023-04-03 VITALS — BP 114/64 | HR 50 | Temp 97.5°F | Resp 18 | Wt 190.0 lb

## 2023-04-03 DIAGNOSIS — C9000 Multiple myeloma not having achieved remission: Secondary | ICD-10-CM

## 2023-04-03 DIAGNOSIS — D472 Monoclonal gammopathy: Secondary | ICD-10-CM

## 2023-04-03 LAB — CBC WITH DIFFERENTIAL (CANCER CENTER ONLY)
Abs Immature Granulocytes: 0.02 10*3/uL (ref 0.00–0.07)
Basophils Absolute: 0 10*3/uL (ref 0.0–0.1)
Basophils Relative: 0 %
Eosinophils Absolute: 0.1 10*3/uL (ref 0.0–0.5)
Eosinophils Relative: 3 %
HCT: 34.4 % — ABNORMAL LOW (ref 39.0–52.0)
Hemoglobin: 12.2 g/dL — ABNORMAL LOW (ref 13.0–17.0)
Immature Granulocytes: 1 %
Lymphocytes Relative: 30 %
Lymphs Abs: 0.9 10*3/uL (ref 0.7–4.0)
MCH: 34.4 pg — ABNORMAL HIGH (ref 26.0–34.0)
MCHC: 35.5 g/dL (ref 30.0–36.0)
MCV: 96.9 fL (ref 80.0–100.0)
Monocytes Absolute: 0.3 10*3/uL (ref 0.1–1.0)
Monocytes Relative: 9 %
Neutro Abs: 1.8 10*3/uL (ref 1.7–7.7)
Neutrophils Relative %: 57 %
Platelet Count: 73 10*3/uL — ABNORMAL LOW (ref 150–400)
RBC: 3.55 MIL/uL — ABNORMAL LOW (ref 4.22–5.81)
RDW: 12.5 % (ref 11.5–15.5)
WBC Count: 3.1 10*3/uL — ABNORMAL LOW (ref 4.0–10.5)
nRBC: 0 % (ref 0.0–0.2)

## 2023-04-03 LAB — CMP (CANCER CENTER ONLY)
ALT: 11 U/L (ref 0–44)
AST: 20 U/L (ref 15–41)
Albumin: 4.2 g/dL (ref 3.5–5.0)
Alkaline Phosphatase: 101 U/L (ref 38–126)
Anion gap: 5 (ref 5–15)
BUN: 20 mg/dL (ref 8–23)
CO2: 34 mmol/L — ABNORMAL HIGH (ref 22–32)
Calcium: 8.9 mg/dL (ref 8.9–10.3)
Chloride: 102 mmol/L (ref 98–111)
Creatinine: 1.1 mg/dL (ref 0.61–1.24)
GFR, Estimated: >60 mL/min (ref >60)
Glucose, Bld: 110 mg/dL — ABNORMAL HIGH (ref 70–99)
Potassium: 3.8 mmol/L (ref 3.5–5.1)
Sodium: 141 mmol/L (ref 135–145)
Total Bilirubin: 0.9 mg/dL (ref 0.3–1.2)
Total Protein: 6.1 g/dL — ABNORMAL LOW (ref 6.5–8.1)

## 2023-04-03 LAB — LACTATE DEHYDROGENASE: LDH: 347 U/L — ABNORMAL HIGH (ref 98–192)

## 2023-04-03 NOTE — Progress Notes (Signed)
Hematology and Oncology Follow Up Visit  Riley Eaton [id number] [birth year] 82 y.o. 04/03/2023   Principle Diagnosis:  IgG Kappa myeloma  -- +1q and t(11:14)   Current Therapy:        Velcade/Decadron -- (3 wk on/1 wk off) -- s/p cycle #12 -- started on 08/24/2021 Faspro - s/p cycle 6 -  started on 04/30/2022- currently on hold    Interim History:  Riley Eaton is here today for follow-up and treatment.His platelet values did drop so he had a 3 week holiday from treatment. He is here for follow up labs and consideration of additional treatment.   He reports that he has been feeling ok overall. Fatigue, pain are about the same. No unintentional weight loss or night sweats.   His monoclonal studies have been holding pretty steady.  His last monoclonal spike in July was 0.3 g/dL.  His IgG level was 731 mg/dL.  His Kappa light chain was 3.8 mg/dL.  He is eating okay.  He is having no problems with nausea or vomiting.  He is having no issues with bowels or bladder.  He has horrible back pain.  He uses Vicodin.  He has had no bleeding.  There is been no leg swelling.  Overall, I would say his performance status is probably ECOG 2.    Wt Readings from Last 3 Encounters:  04/03/23 190 lb (86.2 kg)  03/13/23 190 lb 6.4 oz (86.4 kg)  02/14/23 193 lb (87.5 kg)     Medications:  Allergies as of 04/03/2023   No Known Allergies      Medication List        Accurate as of April 03, 2023 10:50 AM. If you have any questions, ask your nurse or doctor.          acetaminophen 325 MG tablet Commonly known as: TYLENOL Take 2 tablets (650 mg total) by mouth every 6 (six) hours as needed for mild pain (or temp > 100).   acyclovir 400 MG tablet Commonly known as: ZOVIRAX TAKE 1 TABLET BY MOUTH TWICE A DAY   allopurinol 100 MG tablet Commonly known as: ZYLOPRIM TAKE 1 TABLET BY MOUTH EVERY DAY   aspirin 81 MG chewable tablet Chew 1 tablet (81 mg total) by mouth daily.    atorvastatin 40 MG tablet Commonly known as: LIPITOR Take 1 tablet (40 mg total) by mouth daily at 6 PM. What changed: when to take this   carvedilol 6.25 MG tablet Commonly known as: COREG Take 1 tablet by mouth 2 (two) times daily.   colchicine 0.6 MG tablet   cyanocobalamin 500 MCG tablet Commonly known as: VITAMIN B12 Take 500 mcg by mouth daily.   diphenhydramine-acetaminophen 25-500 MG Tabs tablet Commonly known as: TYLENOL PM Take 1 tablet by mouth at bedtime.   ferrous sulfate 325 (65 FE) MG tablet Take 325 mg by mouth daily.   finasteride 5 MG tablet Commonly known as: PROSCAR Take 1 tablet (5 mg total) by mouth daily. For urination   furosemide 40 MG tablet Commonly known as: LASIX TAKE 1 TABLET BY MOUTH EVERY DAY   HYDROcodone-acetaminophen 7.5-325 MG tablet Commonly known as: NORCO Take 1 tablet by mouth 4 (four) times daily as needed for moderate pain.   LORazepam 0.5 MG tablet Commonly known as: ATIVAN TAKE 1 TABLET (0.5 MG TOTAL) BY MOUTH EVERY 6 (SIX) HOURS AS NEEDED (NAUSEA OR VOMITING).   losartan 25 MG tablet Commonly known as: COZAAR Take 25 mg by mouth  daily.   Movantik 25 MG Tabs tablet Generic drug: naloxegol oxalate Take 25 mg by mouth daily.   OLANZapine 5 MG tablet Commonly known as: ZYPREXA Take 1 tablet (5 mg total) by mouth daily.   ondansetron 4 MG disintegrating tablet Commonly known as: Zofran ODT Take 1 tablet (4 mg total) by mouth every 8 (eight) hours as needed for nausea or vomiting.   pantoprazole 40 MG tablet Commonly known as: PROTONIX Take 1 tablet (40 mg total) by mouth at bedtime. What changed: when to take this   polyethylene glycol 17 g packet Commonly known as: MIRALAX / GLYCOLAX Take 17 g by mouth daily. What changed:  when to take this reasons to take this   pregabalin 150 MG capsule Commonly known as: LYRICA Take 150 mg by mouth 2 (two) times daily.   promethazine 25 MG tablet Commonly known as:  PHENERGAN Take 25 mg by mouth every 4 (four) hours as needed.   traZODone 50 MG tablet Commonly known as: DESYREL Take 50 mg by mouth at bedtime.        Allergies: No Known Allergies  Past Medical History, Surgical history, Social history, and Family History were reviewed and updated.  Review of Systems: All other 10 point review of systems is negative.   Physical Exam:  weight is 190 lb (86.2 kg). His oral temperature is 97.5 F (36.4 C) (abnormal). His blood pressure is 114/64 and his pulse is 50 (abnormal). His respiration is 18 and oxygen saturation is 96%.   Wt Readings from Last 3 Encounters:  04/03/23 190 lb (86.2 kg)  03/13/23 190 lb 6.4 oz (86.4 kg)  02/14/23 193 lb (87.5 kg)   Constitutional: Using a rolling walker. Kyphotic  Ocular: Sclerae unicteric, pupils equal, round and reactive to light Ear-nose-throat: Oropharynx clear, dentition fair Lymphatic: No cervical or supraclavicular adenopathy Lungs no rales or rhonchi, good excursion bilaterally Heart regular rate and rhythm, no murmur appreciated Abd soft, nontender, positive bowel sounds MSK no focal spinal tenderness, no joint edema Neuro: non-focal, well-oriented, appropriate affect  Lab Results  Component Value Date   WBC 3.1 (L) 04/03/2023   HGB 12.2 (L) 04/03/2023   HCT 34.4 (L) 04/03/2023   MCV 96.9 04/03/2023   PLT 73 (L) 04/03/2023   No results found for: "FERRITIN", "IRON", "TIBC", "UIBC", "IRONPCTSAT" Lab Results  Component Value Date   RBC 3.55 (L) 04/03/2023   Lab Results  Component Value Date   KPAFRELGTCHN 38.5 (H) 03/06/2023   LAMBDASER 17.5 03/06/2023   KAPLAMBRATIO 2.20 (H) 03/06/2023   Lab Results  Component Value Date   IGGSERUM 731 03/06/2023   IGA 43 (L) 03/06/2023   IGMSERUM 24 03/06/2023   Lab Results  Component Value Date   TOTALPROTELP 5.9 (L) 03/06/2023   ALBUMINELP 3.9 03/06/2023   A1GS 0.2 03/06/2023   A2GS 0.4 03/06/2023   BETS 0.7 03/06/2023   GAMS 0.7  03/06/2023   MSPIKE 0.3 (H) 03/06/2023   SPEI Comment 03/06/2023     Chemistry      Component Value Date/Time   NA 141 04/03/2023 1015   NA 142 10/15/2019 1500   K 3.8 04/03/2023 1015   CL 102 04/03/2023 1015   CO2 34 (H) 04/03/2023 1015   BUN 20 04/03/2023 1015   BUN 25 10/15/2019 1500   CREATININE 1.10 04/03/2023 1015      Component Value Date/Time   CALCIUM 8.9 04/03/2023 1015   ALKPHOS 101 04/03/2023 1015   AST 20 04/03/2023 1015  ALT 11 04/03/2023 1015   BILITOT 0.9 04/03/2023 1015      Encounter Diagnosis  Name Primary?   MGUS (monoclonal gammopathy of unknown significance)     Impression and Plan: Mr. Rugar is a very pleasant 82 yo caucasian gentleman with IgG Kappa myeloma. He is currently off of treatment given pancytopenia.   Counts are down slightly from where they were. Clinically he feels ok. Discussed with Dr. Myna Hidalgo and we have decided to give him an additional 1 month holiday to allow counts to rebound. If unimproved he may require a follow up bone marrow biopsy.   Disposition No treatment today- cancel treatments/visits in between now and 1 month RTC 1 month MD, labs, infusion  Rushie Chestnut, PA-C 8/22/202410:50 AM

## 2023-04-05 LAB — IGG, IGA, IGM
IgA: 38 mg/dL — ABNORMAL LOW (ref 61–437)
IgG (Immunoglobin G), Serum: 688 mg/dL (ref 603–1613)
IgM (Immunoglobulin M), Srm: 27 mg/dL (ref 15–143)

## 2023-04-07 LAB — KAPPA/LAMBDA LIGHT CHAINS
Kappa free light chain: 37.3 mg/L — ABNORMAL HIGH (ref 3.3–19.4)
Kappa, lambda light chain ratio: 2.25 — ABNORMAL HIGH (ref 0.26–1.65)
Lambda free light chains: 16.6 mg/L (ref 5.7–26.3)

## 2023-04-10 ENCOUNTER — Inpatient Hospital Stay: Payer: Medicare PPO

## 2023-04-10 LAB — PROTEIN ELECTROPHORESIS, SERUM, WITH REFLEX
A/G Ratio: 1.9 — ABNORMAL HIGH (ref 0.7–1.7)
Albumin ELP: 3.7 g/dL (ref 2.9–4.4)
Alpha-1-Globulin: 0.2 g/dL (ref 0.0–0.4)
Alpha-2-Globulin: 0.4 g/dL (ref 0.4–1.0)
Beta Globulin: 0.7 g/dL (ref 0.7–1.3)
Gamma Globulin: 0.6 g/dL (ref 0.4–1.8)
Globulin, Total: 2 g/dL — ABNORMAL LOW (ref 2.2–3.9)
M-Spike, %: 0.4 g/dL — ABNORMAL HIGH
SPEP Interpretation: 0
Total Protein ELP: 5.7 g/dL — ABNORMAL LOW (ref 6.0–8.5)

## 2023-04-10 LAB — IMMUNOFIXATION REFLEX, SERUM
IgA: 38 mg/dL — ABNORMAL LOW (ref 61–437)
IgG (Immunoglobin G), Serum: 686 mg/dL (ref 603–1613)
IgM (Immunoglobulin M), Srm: 23 mg/dL (ref 15–143)

## 2023-05-01 ENCOUNTER — Encounter: Payer: Self-pay | Admitting: Medical Oncology

## 2023-05-01 ENCOUNTER — Other Ambulatory Visit: Payer: Self-pay

## 2023-05-01 ENCOUNTER — Inpatient Hospital Stay (HOSPITAL_BASED_OUTPATIENT_CLINIC_OR_DEPARTMENT_OTHER): Payer: Medicare PPO | Admitting: Medical Oncology

## 2023-05-01 ENCOUNTER — Inpatient Hospital Stay: Payer: Medicare PPO | Attending: Hematology & Oncology

## 2023-05-01 ENCOUNTER — Inpatient Hospital Stay: Payer: Medicare PPO

## 2023-05-01 VITALS — BP 96/59 | HR 55 | Temp 97.6°F | Resp 18 | Ht 67.0 in | Wt 190.0 lb

## 2023-05-01 DIAGNOSIS — D61810 Antineoplastic chemotherapy induced pancytopenia: Secondary | ICD-10-CM | POA: Diagnosis not present

## 2023-05-01 DIAGNOSIS — D472 Monoclonal gammopathy: Secondary | ICD-10-CM

## 2023-05-01 DIAGNOSIS — C9000 Multiple myeloma not having achieved remission: Secondary | ICD-10-CM | POA: Diagnosis present

## 2023-05-01 DIAGNOSIS — T451X5A Adverse effect of antineoplastic and immunosuppressive drugs, initial encounter: Secondary | ICD-10-CM | POA: Diagnosis not present

## 2023-05-01 LAB — CBC WITH DIFFERENTIAL (CANCER CENTER ONLY)
Abs Immature Granulocytes: 0.02 10*3/uL (ref 0.00–0.07)
Basophils Absolute: 0 10*3/uL (ref 0.0–0.1)
Basophils Relative: 0 %
Eosinophils Absolute: 0.1 10*3/uL (ref 0.0–0.5)
Eosinophils Relative: 3 %
HCT: 35.4 % — ABNORMAL LOW (ref 39.0–52.0)
Hemoglobin: 12.5 g/dL — ABNORMAL LOW (ref 13.0–17.0)
Immature Granulocytes: 1 %
Lymphocytes Relative: 33 %
Lymphs Abs: 1.2 10*3/uL (ref 0.7–4.0)
MCH: 34 pg (ref 26.0–34.0)
MCHC: 35.3 g/dL (ref 30.0–36.0)
MCV: 96.2 fL (ref 80.0–100.0)
Monocytes Absolute: 0.3 10*3/uL (ref 0.1–1.0)
Monocytes Relative: 8 %
Neutro Abs: 2 10*3/uL (ref 1.7–7.7)
Neutrophils Relative %: 55 %
Platelet Count: 77 10*3/uL — ABNORMAL LOW (ref 150–400)
RBC: 3.68 MIL/uL — ABNORMAL LOW (ref 4.22–5.81)
RDW: 12.6 % (ref 11.5–15.5)
WBC Count: 3.5 10*3/uL — ABNORMAL LOW (ref 4.0–10.5)
nRBC: 0 % (ref 0.0–0.2)

## 2023-05-01 LAB — CMP (CANCER CENTER ONLY)
ALT: 12 U/L (ref 0–44)
AST: 21 U/L (ref 15–41)
Albumin: 4.3 g/dL (ref 3.5–5.0)
Alkaline Phosphatase: 95 U/L (ref 38–126)
Anion gap: 6 (ref 5–15)
BUN: 19 mg/dL (ref 8–23)
CO2: 32 mmol/L (ref 22–32)
Calcium: 9.1 mg/dL (ref 8.9–10.3)
Chloride: 104 mmol/L (ref 98–111)
Creatinine: 1.02 mg/dL (ref 0.61–1.24)
GFR, Estimated: >60 mL/min (ref >60)
Glucose, Bld: 123 mg/dL — ABNORMAL HIGH (ref 70–99)
Potassium: 3.7 mmol/L (ref 3.5–5.1)
Sodium: 142 mmol/L (ref 135–145)
Total Bilirubin: 1.1 mg/dL (ref 0.3–1.2)
Total Protein: 6 g/dL — ABNORMAL LOW (ref 6.5–8.1)

## 2023-05-01 NOTE — Progress Notes (Signed)
Hematology and Oncology Follow Up Visit  Riley Eaton [id number] [birth year] 82 y.o. 05/01/2023   Principle Diagnosis:  IgG Kappa myeloma  -- +1q and t(11:14)   Current Therapy:        Velcade/Decadron -- (3 wk on/1 wk off) -- s/p cycle #12 -- started on 08/24/2021- currently on hold Faspro - s/p cycle 6 -  started on 04/30/2022- currently on hold    Interim History:  Riley Eaton is here today for follow-up and treatment. He has been on a treatment holiday since the end of July due to falling blood counts.   Today he states that he is feeling well. Fatigue, pain are about the same to maybe slightly improved. He uses Vicodin for pain. No unintentional weight loss or night sweats.   His monoclonal studies have been holding pretty steady.  His last monoclonal spike in August was 0.4 g/dL.  His IgG level was 688 mg/dL.  His Kappa light chain was 3.7 mg/dL.  He is eating okay.  He is having no problems with nausea or vomiting.  He is having no issues with bowels or bladder.  He has had no bleeding.  There is been no leg swelling.  Overall, I would say his performance status is probably ECOG 2.    Wt Readings from Last 3 Encounters:  05/01/23 190 lb (86.2 kg)  04/03/23 190 lb (86.2 kg)  03/13/23 190 lb 6.4 oz (86.4 kg)     Medications:  Allergies as of 05/01/2023   No Known Allergies      Medication List        Accurate as of May 01, 2023 10:26 AM. If you have any questions, ask your nurse or doctor.          acetaminophen 325 MG tablet Commonly known as: TYLENOL Take 2 tablets (650 mg total) by mouth every 6 (six) hours as needed for mild pain (or temp > 100).   acyclovir 400 MG tablet Commonly known as: ZOVIRAX TAKE 1 TABLET BY MOUTH TWICE A DAY   allopurinol 100 MG tablet Commonly known as: ZYLOPRIM TAKE 1 TABLET BY MOUTH EVERY DAY   aspirin 81 MG chewable tablet Chew 1 tablet (81 mg total) by mouth daily.   atorvastatin 40 MG tablet Commonly  known as: LIPITOR Take 1 tablet (40 mg total) by mouth daily at 6 PM. What changed: when to take this   carvedilol 6.25 MG tablet Commonly known as: COREG Take 1 tablet by mouth 2 (two) times daily.   colchicine 0.6 MG tablet   cyanocobalamin 500 MCG tablet Commonly known as: VITAMIN B12 Take 500 mcg by mouth daily.   diphenhydramine-acetaminophen 25-500 MG Tabs tablet Commonly known as: TYLENOL PM Take 1 tablet by mouth at bedtime.   ferrous sulfate 325 (65 FE) MG tablet Take 325 mg by mouth daily.   finasteride 5 MG tablet Commonly known as: PROSCAR Take 1 tablet (5 mg total) by mouth daily. For urination   furosemide 40 MG tablet Commonly known as: LASIX TAKE 1 TABLET BY MOUTH EVERY DAY   HYDROcodone-acetaminophen 7.5-325 MG tablet Commonly known as: NORCO Take 1 tablet by mouth 4 (four) times daily as needed for moderate pain.   LORazepam 0.5 MG tablet Commonly known as: ATIVAN TAKE 1 TABLET (0.5 MG TOTAL) BY MOUTH EVERY 6 (SIX) HOURS AS NEEDED (NAUSEA OR VOMITING).   losartan 25 MG tablet Commonly known as: COZAAR Take 25 mg by mouth daily.   Movantik 25 MG Tabs  tablet Generic drug: naloxegol oxalate Take 25 mg by mouth daily.   OLANZapine 5 MG tablet Commonly known as: ZYPREXA Take 1 tablet (5 mg total) by mouth daily.   ondansetron 4 MG disintegrating tablet Commonly known as: Zofran ODT Take 1 tablet (4 mg total) by mouth every 8 (eight) hours as needed for nausea or vomiting.   pantoprazole 40 MG tablet Commonly known as: PROTONIX Take 1 tablet (40 mg total) by mouth at bedtime. What changed: when to take this   polyethylene glycol 17 g packet Commonly known as: MIRALAX / GLYCOLAX Take 17 g by mouth daily. What changed:  when to take this reasons to take this   pregabalin 150 MG capsule Commonly known as: LYRICA Take 150 mg by mouth 2 (two) times daily.   promethazine 25 MG tablet Commonly known as: PHENERGAN Take 25 mg by mouth every 4  (four) hours as needed.   traZODone 50 MG tablet Commonly known as: DESYREL Take 50 mg by mouth at bedtime.        Allergies: No Known Allergies  Past Medical History, Surgical history, Social history, and Family History were reviewed and updated.  Review of Systems: All other 10 point review of systems is negative.   Physical Exam:  height is 5\' 7"  (1.702 m) and weight is 190 lb (86.2 kg). His oral temperature is 97.6 F (36.4 C). His blood pressure is 96/59 (abnormal) and his pulse is 55 (abnormal). His respiration is 18 and oxygen saturation is 98%.   Wt Readings from Last 3 Encounters:  05/01/23 190 lb (86.2 kg)  04/03/23 190 lb (86.2 kg)  03/13/23 190 lb 6.4 oz (86.4 kg)   Constitutional: Using a rolling walker. Kyphotic  Ocular: Sclerae unicteric, pupils equal, round and reactive to light Ear-nose-throat: Oropharynx clear, dentition fair Lymphatic: No cervical or supraclavicular adenopathy Lungs no rales or rhonchi, good excursion bilaterally Heart regular rate and rhythm, no murmur appreciated Abd soft, nontender, positive bowel sounds MSK no focal spinal tenderness, no joint edema Neuro: non-focal, well-oriented, appropriate affect  Lab Results  Component Value Date   WBC 3.5 (L) 05/01/2023   HGB 12.5 (L) 05/01/2023   HCT 35.4 (L) 05/01/2023   MCV 96.2 05/01/2023   PLT 77 (L) 05/01/2023   No results found for: "FERRITIN", "IRON", "TIBC", "UIBC", "IRONPCTSAT" Lab Results  Component Value Date   RBC 3.68 (L) 05/01/2023   Lab Results  Component Value Date   KPAFRELGTCHN 37.3 (H) 04/03/2023   LAMBDASER 16.6 04/03/2023   KAPLAMBRATIO 2.25 (H) 04/03/2023   Lab Results  Component Value Date   IGGSERUM 686 04/03/2023   IGA 38 (L) 04/03/2023   IGMSERUM 23 04/03/2023   Lab Results  Component Value Date   TOTALPROTELP 5.7 (L) 04/03/2023   ALBUMINELP 3.7 04/03/2023   A1GS 0.2 04/03/2023   A2GS 0.4 04/03/2023   BETS 0.7 04/03/2023   GAMS 0.6 04/03/2023    MSPIKE 0.4 (H) 04/03/2023   SPEI Comment 03/06/2023     Chemistry      Component Value Date/Time   NA 141 04/03/2023 1015   NA 142 10/15/2019 1500   K 3.8 04/03/2023 1015   CL 102 04/03/2023 1015   CO2 34 (H) 04/03/2023 1015   BUN 20 04/03/2023 1015   BUN 25 10/15/2019 1500   CREATININE 1.10 04/03/2023 1015      Component Value Date/Time   CALCIUM 8.9 04/03/2023 1015   ALKPHOS 101 04/03/2023 1015   AST 20 04/03/2023 1015  ALT 11 04/03/2023 1015   BILITOT 0.9 04/03/2023 1015      Encounter Diagnoses  Name Primary?   MGUS (monoclonal gammopathy of unknown significance)    IgG myeloma (HCC) Yes   Antineoplastic chemotherapy induced pancytopenia (HCC)     Impression and Plan: Mr. Oelke is a very pleasant 82 yo caucasian gentleman with IgG Kappa myeloma. He is currently off of treatment given pancytopenia.   I al so happy to see that his pancytopenia is improving off of treatment. His myeloma studies have been improving. Platelets still lower than ideal for treatment. I discussed case with Dr. Myna Hidalgo who is agreeable to patient holding treatment for another month to allow his platelets/WBC/Hgb rebound further.   Disposition No treatment today- cancel treatments/visits in between now and 1 month RTC 1 month MD, labs, infusion  Brand Males Buford, PA-C 9/19/202410:26 AM

## 2023-05-08 ENCOUNTER — Inpatient Hospital Stay: Payer: Medicare PPO | Admitting: Hematology & Oncology

## 2023-05-08 ENCOUNTER — Inpatient Hospital Stay: Payer: Medicare PPO

## 2023-05-29 ENCOUNTER — Encounter: Payer: Self-pay | Admitting: Hematology & Oncology

## 2023-05-29 ENCOUNTER — Other Ambulatory Visit: Payer: Self-pay

## 2023-05-29 ENCOUNTER — Inpatient Hospital Stay: Payer: Medicare PPO | Admitting: Hematology & Oncology

## 2023-05-29 ENCOUNTER — Inpatient Hospital Stay: Payer: Medicare PPO

## 2023-05-29 ENCOUNTER — Inpatient Hospital Stay: Payer: Medicare PPO | Attending: Hematology & Oncology

## 2023-05-29 VITALS — BP 103/65 | HR 52 | Temp 97.7°F | Resp 20 | Ht 67.0 in | Wt 190.0 lb

## 2023-05-29 DIAGNOSIS — D472 Monoclonal gammopathy: Secondary | ICD-10-CM

## 2023-05-29 DIAGNOSIS — D61818 Other pancytopenia: Secondary | ICD-10-CM | POA: Diagnosis not present

## 2023-05-29 DIAGNOSIS — K5900 Constipation, unspecified: Secondary | ICD-10-CM | POA: Diagnosis not present

## 2023-05-29 DIAGNOSIS — C9000 Multiple myeloma not having achieved remission: Secondary | ICD-10-CM | POA: Insufficient documentation

## 2023-05-29 LAB — LACTATE DEHYDROGENASE: LDH: 419 U/L — ABNORMAL HIGH (ref 98–192)

## 2023-05-29 LAB — CBC WITH DIFFERENTIAL (CANCER CENTER ONLY)
Abs Immature Granulocytes: 0.03 10*3/uL (ref 0.00–0.07)
Basophils Absolute: 0 10*3/uL (ref 0.0–0.1)
Basophils Relative: 0 %
Eosinophils Absolute: 0.1 10*3/uL (ref 0.0–0.5)
Eosinophils Relative: 2 %
HCT: 35 % — ABNORMAL LOW (ref 39.0–52.0)
Hemoglobin: 12.4 g/dL — ABNORMAL LOW (ref 13.0–17.0)
Immature Granulocytes: 1 %
Lymphocytes Relative: 29 %
Lymphs Abs: 1 10*3/uL (ref 0.7–4.0)
MCH: 34 pg (ref 26.0–34.0)
MCHC: 35.4 g/dL (ref 30.0–36.0)
MCV: 95.9 fL (ref 80.0–100.0)
Monocytes Absolute: 0.3 10*3/uL (ref 0.1–1.0)
Monocytes Relative: 7 %
Neutro Abs: 2.2 10*3/uL (ref 1.7–7.7)
Neutrophils Relative %: 61 %
Platelet Count: 86 10*3/uL — ABNORMAL LOW (ref 150–400)
RBC: 3.65 MIL/uL — ABNORMAL LOW (ref 4.22–5.81)
RDW: 12.7 % (ref 11.5–15.5)
WBC Count: 3.5 10*3/uL — ABNORMAL LOW (ref 4.0–10.5)
nRBC: 0 % (ref 0.0–0.2)

## 2023-05-29 LAB — CMP (CANCER CENTER ONLY)
ALT: 10 U/L (ref 0–44)
AST: 25 U/L (ref 15–41)
Albumin: 4.2 g/dL (ref 3.5–5.0)
Alkaline Phosphatase: 91 U/L (ref 38–126)
Anion gap: 7 (ref 5–15)
BUN: 21 mg/dL (ref 8–23)
CO2: 32 mmol/L (ref 22–32)
Calcium: 9 mg/dL (ref 8.9–10.3)
Chloride: 104 mmol/L (ref 98–111)
Creatinine: 1.09 mg/dL (ref 0.61–1.24)
GFR, Estimated: >60 mL/min (ref >60)
Glucose, Bld: 117 mg/dL — ABNORMAL HIGH (ref 70–99)
Potassium: 4.1 mmol/L (ref 3.5–5.1)
Sodium: 143 mmol/L (ref 135–145)
Total Bilirubin: 1.2 mg/dL (ref 0.3–1.2)
Total Protein: 6.2 g/dL — ABNORMAL LOW (ref 6.5–8.1)

## 2023-05-29 NOTE — Progress Notes (Signed)
Hematology and Oncology Follow Up Visit  Riley Eaton [id number] 08/17/1940 82 y.o. 05/29/2023   Principle Diagnosis:  IgG Kappa myeloma  -- +1q and t(11:14)   Current Therapy:        Velcade/Decadron -- (3 wk on/1 wk off) -- s/p cycle #12 -- started on 08/24/2021- currently on hold Faspro - s/p cycle 6 -  started on 04/30/2022- currently on hold    Interim History:  Mr. Valliant is here today for follow-up.  He is doing quite well.  He really has no specific complaints since we last saw him.  He has been off treatment since July I am glad that we been able to hold his treatment.  I will make some adjustments with his medications.  He really is on a lot of medications.  I think there is a few that we can get him off of.  He feels more energetic.  Has a little bit more stamina.  His appetite is doing better.  As far as the myeloma is concerned, this really has not shown any evidence of progressing.  His last monoclonal studies showed an M spike of 0.4 g/dL.  His IgG level was 687 mg/dL.  The Kappa light chain was 3.7 mg/dL.  He is still constipated.  I told her to stop taking iron.  This may help the constipation.  He has had no fever.  He has had no rashes.  There is been no leg swelling.  Overall, I would say his performance status is probably ECOG 2..     Wt Readings from Last 3 Encounters:  05/29/23 190 lb (86.2 kg)  05/01/23 190 lb (86.2 kg)  04/03/23 190 lb (86.2 kg)     Medications:  Allergies as of 05/29/2023   No Known Allergies      Medication List        Accurate as of May 29, 2023 10:19 AM. If you have any questions, ask your nurse or doctor.          acetaminophen 325 MG tablet Commonly known as: TYLENOL Take 2 tablets (650 mg total) by mouth every 6 (six) hours as needed for mild pain (or temp > 100).   acyclovir 400 MG tablet Commonly known as: ZOVIRAX TAKE 1 TABLET BY MOUTH TWICE A DAY   allopurinol 100 MG tablet Commonly known as:  ZYLOPRIM TAKE 1 TABLET BY MOUTH EVERY DAY   aspirin 81 MG chewable tablet Chew 1 tablet (81 mg total) by mouth daily.   atorvastatin 40 MG tablet Commonly known as: LIPITOR Take 1 tablet (40 mg total) by mouth daily at 6 PM. What changed: when to take this   carvedilol 6.25 MG tablet Commonly known as: COREG Take 1 tablet by mouth 2 (two) times daily.   colchicine 0.6 MG tablet   cyanocobalamin 500 MCG tablet Commonly known as: VITAMIN B12 Take 500 mcg by mouth daily.   diphenhydramine-acetaminophen 25-500 MG Tabs tablet Commonly known as: TYLENOL PM Take 1 tablet by mouth at bedtime.   ferrous sulfate 325 (65 FE) MG tablet Take 325 mg by mouth daily.   finasteride 5 MG tablet Commonly known as: PROSCAR Take 1 tablet (5 mg total) by mouth daily. For urination   furosemide 40 MG tablet Commonly known as: LASIX TAKE 1 TABLET BY MOUTH EVERY DAY   HYDROcodone-acetaminophen 7.5-325 MG tablet Commonly known as: NORCO Take 1 tablet by mouth 4 (four) times daily as needed for moderate pain.   LORazepam 0.5 MG  tablet Commonly known as: ATIVAN TAKE 1 TABLET (0.5 MG TOTAL) BY MOUTH EVERY 6 (SIX) HOURS AS NEEDED (NAUSEA OR VOMITING).   losartan 25 MG tablet Commonly known as: COZAAR Take 25 mg by mouth daily.   Movantik 25 MG Tabs tablet Generic drug: naloxegol oxalate Take 25 mg by mouth daily.   OLANZapine 5 MG tablet Commonly known as: ZYPREXA Take 1 tablet (5 mg total) by mouth daily.   ondansetron 4 MG disintegrating tablet Commonly known as: Zofran ODT Take 1 tablet (4 mg total) by mouth every 8 (eight) hours as needed for nausea or vomiting.   pantoprazole 40 MG tablet Commonly known as: PROTONIX Take 1 tablet (40 mg total) by mouth at bedtime. What changed: when to take this   polyethylene glycol 17 g packet Commonly known as: MIRALAX / GLYCOLAX Take 17 g by mouth daily. What changed:  when to take this reasons to take this   pregabalin 150 MG  capsule Commonly known as: LYRICA Take 150 mg by mouth 2 (two) times daily.   promethazine 25 MG tablet Commonly known as: PHENERGAN Take 25 mg by mouth every 4 (four) hours as needed.   traZODone 50 MG tablet Commonly known as: DESYREL Take 50 mg by mouth at bedtime.        Allergies: No Known Allergies  Past Medical History, Surgical history, Social history, and Family History were reviewed and updated.  Review of Systems: Review of Systems  Constitutional: Negative.   HENT: Negative.    Eyes: Negative.   Respiratory: Negative.    Cardiovascular: Negative.   Gastrointestinal: Negative.   Genitourinary: Negative.   Musculoskeletal:  Positive for back pain.  Skin: Negative.   Neurological: Negative.   Endo/Heme/Allergies: Negative.   Psychiatric/Behavioral: Negative.     [name]   Physical Exam:  height is 5\' 7"  (1.702 m) and weight is 190 lb (86.2 kg). His oral temperature is 97.7 F (36.5 C). His blood pressure is 103/65 and his pulse is 52 (abnormal). His respiration is 20 and oxygen saturation is 98%.   Wt Readings from Last 3 Encounters:  05/29/23 190 lb (86.2 kg)  05/01/23 190 lb (86.2 kg)  04/03/23 190 lb (86.2 kg)   Physical Exam Vitals reviewed.  HENT:     Head: Normocephalic and atraumatic.  Eyes:     Pupils: Pupils are equal, round, and reactive to light.  Cardiovascular:     Rate and Rhythm: Normal rate and regular rhythm.     Heart sounds: Normal heart sounds.  Pulmonary:     Effort: Pulmonary effort is normal.     Breath sounds: Normal breath sounds.  Abdominal:     General: Bowel sounds are normal.     Palpations: Abdomen is soft.  Musculoskeletal:        General: No tenderness or deformity. Normal range of motion.     Cervical back: Normal range of motion.  Lymphadenopathy:     Cervical: No cervical adenopathy.  Skin:    General: Skin is warm and dry.     Findings: No erythema or rash.  Neurological:     Mental Status: He is alert and  oriented to person, place, and time.  Psychiatric:        Behavior: Behavior normal.        Thought Content: Thought content normal.        Judgment: Judgment normal.      Lab Results  Component Value Date   WBC 3.5 (L)  05/29/2023   HGB 12.4 (L) 05/29/2023   HCT 35.0 (L) 05/29/2023   MCV 95.9 05/29/2023   PLT 86 (L) 05/29/2023   No results found for: "FERRITIN", "IRON", "TIBC", "UIBC", "IRONPCTSAT" Lab Results  Component Value Date   RBC 3.65 (L) 05/29/2023   Lab Results  Component Value Date   KPAFRELGTCHN 37.3 (H) 04/03/2023   LAMBDASER 16.6 04/03/2023   KAPLAMBRATIO 2.25 (H) 04/03/2023   Lab Results  Component Value Date   IGGSERUM 686 04/03/2023   IGA 38 (L) 04/03/2023   IGMSERUM 23 04/03/2023   Lab Results  Component Value Date   TOTALPROTELP 5.7 (L) 04/03/2023   ALBUMINELP 3.7 04/03/2023   A1GS 0.2 04/03/2023   A2GS 0.4 04/03/2023   BETS 0.7 04/03/2023   GAMS 0.6 04/03/2023   MSPIKE 0.4 (H) 04/03/2023   SPEI Comment 03/06/2023     Chemistry      Component Value Date/Time   NA 143 05/29/2023 0935   NA 142 10/15/2019 1500   K 4.1 05/29/2023 0935   CL 104 05/29/2023 0935   CO2 32 05/29/2023 0935   BUN 21 05/29/2023 0935   BUN 25 10/15/2019 1500   CREATININE 1.09 05/29/2023 0935      Component Value Date/Time   CALCIUM 9.0 05/29/2023 0935   ALKPHOS 91 05/29/2023 0935   AST 25 05/29/2023 0935   ALT 10 05/29/2023 0935   BILITOT 1.2 05/29/2023 0935        Impression and Plan: Mr. Burris is a very pleasant 82 yo caucasian gentleman with IgG Kappa myeloma. He is currently off of treatment given pancytopenia.  His platelet count is slowly trending upward..  I still feel that we can hold his treatments.  I do not see anything that looks like myeloma that is active.  Again, this is all about quality of life.  I just want him to have a good quality of life, proceed with the holidays coming up.  We will now get him back after Thanksgiving.  We will see  how his levels look then.     Josph Macho, MD 10/17/202410:19 AM

## 2023-05-30 LAB — KAPPA/LAMBDA LIGHT CHAINS
Kappa free light chain: 40.6 mg/L — ABNORMAL HIGH (ref 3.3–19.4)
Kappa, lambda light chain ratio: 2.8 — ABNORMAL HIGH (ref 0.26–1.65)
Lambda free light chains: 14.5 mg/L (ref 5.7–26.3)

## 2023-05-31 LAB — IGG, IGA, IGM
IgA: 43 mg/dL — ABNORMAL LOW (ref 61–437)
IgG (Immunoglobin G), Serum: 740 mg/dL (ref 603–1613)
IgM (Immunoglobulin M), Srm: 27 mg/dL (ref 15–143)

## 2023-06-02 LAB — PROTEIN ELECTROPHORESIS, SERUM
A/G Ratio: 2.2 — ABNORMAL HIGH (ref 0.7–1.7)
Albumin ELP: 4.1 g/dL (ref 2.9–4.4)
Alpha-1-Globulin: 0.2 g/dL (ref 0.0–0.4)
Alpha-2-Globulin: 0.4 g/dL (ref 0.4–1.0)
Beta Globulin: 0.6 g/dL — ABNORMAL LOW (ref 0.7–1.3)
Gamma Globulin: 0.6 g/dL (ref 0.4–1.8)
Globulin, Total: 1.9 g/dL — ABNORMAL LOW (ref 2.2–3.9)
M-Spike, %: 0.3 g/dL — ABNORMAL HIGH
Total Protein ELP: 6 g/dL (ref 6.0–8.5)

## 2023-06-05 ENCOUNTER — Inpatient Hospital Stay: Payer: Medicare PPO

## 2023-06-17 ENCOUNTER — Other Ambulatory Visit: Payer: Self-pay

## 2023-06-27 ENCOUNTER — Other Ambulatory Visit: Payer: Self-pay | Admitting: Cardiovascular Disease

## 2023-07-15 ENCOUNTER — Ambulatory Visit: Payer: Medicare PPO | Admitting: Hematology & Oncology

## 2023-07-15 ENCOUNTER — Other Ambulatory Visit: Payer: Medicare PPO

## 2023-07-23 ENCOUNTER — Other Ambulatory Visit: Payer: Self-pay

## 2023-07-25 ENCOUNTER — Encounter: Payer: Self-pay | Admitting: Hematology & Oncology

## 2023-07-25 ENCOUNTER — Inpatient Hospital Stay: Payer: Medicare PPO | Attending: Hematology & Oncology

## 2023-07-25 ENCOUNTER — Inpatient Hospital Stay: Payer: Medicare PPO | Admitting: Hematology & Oncology

## 2023-07-25 VITALS — BP 96/55 | HR 48 | Temp 97.6°F | Resp 19 | Ht 67.0 in | Wt 183.0 lb

## 2023-07-25 DIAGNOSIS — D472 Monoclonal gammopathy: Secondary | ICD-10-CM

## 2023-07-25 DIAGNOSIS — D61818 Other pancytopenia: Secondary | ICD-10-CM | POA: Diagnosis not present

## 2023-07-25 DIAGNOSIS — K5900 Constipation, unspecified: Secondary | ICD-10-CM | POA: Insufficient documentation

## 2023-07-25 DIAGNOSIS — C9000 Multiple myeloma not having achieved remission: Secondary | ICD-10-CM | POA: Diagnosis present

## 2023-07-25 LAB — CBC WITH DIFFERENTIAL (CANCER CENTER ONLY)
Abs Immature Granulocytes: 0.02 10*3/uL (ref 0.00–0.07)
Basophils Absolute: 0 10*3/uL (ref 0.0–0.1)
Basophils Relative: 0 %
Eosinophils Absolute: 0 10*3/uL (ref 0.0–0.5)
Eosinophils Relative: 1 %
HCT: 33 % — ABNORMAL LOW (ref 39.0–52.0)
Hemoglobin: 11.6 g/dL — ABNORMAL LOW (ref 13.0–17.0)
Immature Granulocytes: 1 %
Lymphocytes Relative: 22 %
Lymphs Abs: 0.9 10*3/uL (ref 0.7–4.0)
MCH: 34.2 pg — ABNORMAL HIGH (ref 26.0–34.0)
MCHC: 35.2 g/dL (ref 30.0–36.0)
MCV: 97.3 fL (ref 80.0–100.0)
Monocytes Absolute: 0.3 10*3/uL (ref 0.1–1.0)
Monocytes Relative: 7 %
Neutro Abs: 2.8 10*3/uL (ref 1.7–7.7)
Neutrophils Relative %: 69 %
Platelet Count: 74 10*3/uL — ABNORMAL LOW (ref 150–400)
RBC: 3.39 MIL/uL — ABNORMAL LOW (ref 4.22–5.81)
RDW: 13.1 % (ref 11.5–15.5)
WBC Count: 4 10*3/uL (ref 4.0–10.5)
nRBC: 0 % (ref 0.0–0.2)

## 2023-07-25 LAB — CMP (CANCER CENTER ONLY)
ALT: 13 U/L (ref 0–44)
AST: 24 U/L (ref 15–41)
Albumin: 4.2 g/dL (ref 3.5–5.0)
Alkaline Phosphatase: 81 U/L (ref 38–126)
Anion gap: 7 (ref 5–15)
BUN: 19 mg/dL (ref 8–23)
CO2: 32 mmol/L (ref 22–32)
Calcium: 9.2 mg/dL (ref 8.9–10.3)
Chloride: 103 mmol/L (ref 98–111)
Creatinine: 1.08 mg/dL (ref 0.61–1.24)
GFR, Estimated: >60 mL/min (ref >60)
Glucose, Bld: 134 mg/dL — ABNORMAL HIGH (ref 70–99)
Potassium: 3.7 mmol/L (ref 3.5–5.1)
Sodium: 142 mmol/L (ref 135–145)
Total Bilirubin: 1.2 mg/dL — ABNORMAL HIGH (ref <1.2)
Total Protein: 6.1 g/dL — ABNORMAL LOW (ref 6.5–8.1)

## 2023-07-25 LAB — LACTATE DEHYDROGENASE: LDH: 448 U/L — ABNORMAL HIGH (ref 98–192)

## 2023-07-25 NOTE — Progress Notes (Signed)
Hematology and Oncology Follow Up Visit  Riley Eaton [id number] 02-15-1941 82 y.o. 07/25/2023   Principle Diagnosis:  IgG Kappa myeloma  -- +1q and t(11:14)   Current Therapy:        Velcade/Decadron -- (3 wk on/1 wk off) -- s/p cycle #12 -- started on 08/24/2021- currently on hold Faspro - s/p cycle 6 -  started on 04/30/2022- currently on hold    Interim History:  Riley Eaton is here today for follow-up.  He is managing pretty well.  We have him off treatment now for probably 3 to 4 months.  I am glad that we have been able to stop treatment on him.  He really was becoming more affected by the treatment.  His quality life is going down.  When we last checked his myeloma studies, they are holding steady.  His monoclonal spike was 0.3 g/dL.  The IgG level was 740 mg/dL.  The Kappa light chain was 4.1 mg/dL.  His appetite has been good.  He had a very nice Thanksgiving with his family.  He has had no fever.  He has had no cough.  He has had no bleeding.  There has been no change in bowel or bladder habits.  He has always had constipation.  Currently, I would say that his performance status is probably ECOG 2.      Wt Readings from Last 3 Encounters:  07/25/23 183 lb (83 kg)  05/29/23 190 lb (86.2 kg)  05/01/23 190 lb (86.2 kg)     Medications:  Allergies as of 07/25/2023   No Known Allergies      Medication List        Accurate as of July 25, 2023  9:33 AM. If you have any questions, ask your nurse or doctor.          acetaminophen 325 MG tablet Commonly known as: TYLENOL Take 2 tablets (650 mg total) by mouth every 6 (six) hours as needed for mild pain (or temp > 100).   allopurinol 100 MG tablet Commonly known as: ZYLOPRIM TAKE 1 TABLET BY MOUTH EVERY DAY   aspirin 81 MG chewable tablet Chew 1 tablet (81 mg total) by mouth daily.   atorvastatin 40 MG tablet Commonly known as: LIPITOR Take 1 tablet (40 mg total) by mouth daily at 6 PM. What  changed: when to take this   carvedilol 6.25 MG tablet Commonly known as: COREG Take 1 tablet by mouth 2 (two) times daily.   colchicine 0.6 MG tablet   cyanocobalamin 500 MCG tablet Commonly known as: VITAMIN B12 Take 500 mcg by mouth daily.   diphenhydramine-acetaminophen 25-500 MG Tabs tablet Commonly known as: TYLENOL PM Take 1 tablet by mouth at bedtime.   finasteride 5 MG tablet Commonly known as: PROSCAR Take 1 tablet (5 mg total) by mouth daily. For urination   furosemide 40 MG tablet Commonly known as: LASIX TAKE 1 TABLET BY MOUTH EVERY DAY   HYDROcodone-acetaminophen 7.5-325 MG tablet Commonly known as: NORCO Take 1 tablet by mouth 4 (four) times daily as needed for moderate pain.   LORazepam 0.5 MG tablet Commonly known as: ATIVAN TAKE 1 TABLET (0.5 MG TOTAL) BY MOUTH EVERY 6 (SIX) HOURS AS NEEDED (NAUSEA OR VOMITING).   losartan 25 MG tablet Commonly known as: COZAAR Take 25 mg by mouth daily.   Movantik 25 MG Tabs tablet Generic drug: naloxegol oxalate Take 25 mg by mouth daily.   ondansetron 4 MG disintegrating tablet Commonly known  as: Zofran ODT Take 1 tablet (4 mg total) by mouth every 8 (eight) hours as needed for nausea or vomiting.   pantoprazole 40 MG tablet Commonly known as: PROTONIX Take 1 tablet (40 mg total) by mouth at bedtime. What changed: when to take this   polyethylene glycol 17 g packet Commonly known as: MIRALAX / GLYCOLAX Take 17 g by mouth daily. What changed:  when to take this reasons to take this   pregabalin 150 MG capsule Commonly known as: LYRICA Take 150 mg by mouth 2 (two) times daily.   traZODone 50 MG tablet Commonly known as: DESYREL Take 50 mg by mouth at bedtime.        Allergies: No Known Allergies  Past Medical History, Surgical history, Social history, and Family History were reviewed and updated.  Review of Systems: Review of Systems  Constitutional: Negative.   HENT: Negative.    Eyes:  Negative.   Respiratory: Negative.    Cardiovascular: Negative.   Gastrointestinal: Negative.   Genitourinary: Negative.   Musculoskeletal:  Positive for back pain.  Skin: Negative.   Neurological: Negative.   Endo/Heme/Allergies: Negative.   Psychiatric/Behavioral: Negative.     [name]   Physical Exam:  height is 5\' 7"  (1.702 m) and weight is 183 lb (83 kg). His oral temperature is 97.6 F (36.4 C). His blood pressure is 96/55 (abnormal) and his pulse is 48 (abnormal). His respiration is 19 and oxygen saturation is 96%.   Wt Readings from Last 3 Encounters:  07/25/23 183 lb (83 kg)  05/29/23 190 lb (86.2 kg)  05/01/23 190 lb (86.2 kg)   Physical Exam Vitals reviewed.  HENT:     Head: Normocephalic and atraumatic.  Eyes:     Pupils: Pupils are equal, round, and reactive to light.  Cardiovascular:     Rate and Rhythm: Normal rate and regular rhythm.     Heart sounds: Normal heart sounds.  Pulmonary:     Effort: Pulmonary effort is normal.     Breath sounds: Normal breath sounds.  Abdominal:     General: Bowel sounds are normal.     Palpations: Abdomen is soft.  Musculoskeletal:        General: No tenderness or deformity. Normal range of motion.     Cervical back: Normal range of motion.  Lymphadenopathy:     Cervical: No cervical adenopathy.  Skin:    General: Skin is warm and dry.     Findings: No erythema or rash.  Neurological:     Mental Status: He is alert and oriented to person, place, and time.  Psychiatric:        Behavior: Behavior normal.        Thought Content: Thought content normal.        Judgment: Judgment normal.      Lab Results  Component Value Date   WBC 4.0 07/25/2023   HGB 11.6 (L) 07/25/2023   HCT 33.0 (L) 07/25/2023   MCV 97.3 07/25/2023   PLT 74 (L) 07/25/2023   No results found for: "FERRITIN", "IRON", "TIBC", "UIBC", "IRONPCTSAT" Lab Results  Component Value Date   RBC 3.39 (L) 07/25/2023   Lab Results  Component Value Date    KPAFRELGTCHN 40.6 (H) 05/29/2023   LAMBDASER 14.5 05/29/2023   KAPLAMBRATIO 2.80 (H) 05/29/2023   Lab Results  Component Value Date   IGGSERUM 740 05/29/2023   IGA 43 (L) 05/29/2023   IGMSERUM 27 05/29/2023   Lab Results  Component Value Date  TOTALPROTELP 6.0 05/29/2023   ALBUMINELP 4.1 05/29/2023   A1GS 0.2 05/29/2023   A2GS 0.4 05/29/2023   BETS 0.6 (L) 05/29/2023   GAMS 0.6 05/29/2023   MSPIKE 0.3 (H) 05/29/2023   SPEI Comment 05/29/2023     Chemistry      Component Value Date/Time   NA 143 05/29/2023 0935   NA 142 10/15/2019 1500   K 4.1 05/29/2023 0935   CL 104 05/29/2023 0935   CO2 32 05/29/2023 0935   BUN 21 05/29/2023 0935   BUN 25 10/15/2019 1500   CREATININE 1.09 05/29/2023 0935      Component Value Date/Time   CALCIUM 9.0 05/29/2023 0935   ALKPHOS 91 05/29/2023 0935   AST 25 05/29/2023 0935   ALT 10 05/29/2023 0935   BILITOT 1.2 05/29/2023 0935        Impression and Plan: Riley Eaton is a very pleasant 82 yo caucasian gentleman with IgG Kappa myeloma. He is currently off of treatment given pancytopenia.  Currently, his blood counts are still about the same.  His white cell count is a little bit better.  Again his quality of life is doing better.  To me, this is what is important.  We will still plan to get him back in about 2 months now.  I think this would be reasonable.  We will try to keep him off treatment as long as possible.     Josph Macho, MD 12/13/20249:33 AM

## 2023-07-27 LAB — IGG, IGA, IGM
IgA: 41 mg/dL — ABNORMAL LOW (ref 61–437)
IgG (Immunoglobin G), Serum: 678 mg/dL (ref 603–1613)
IgM (Immunoglobulin M), Srm: 30 mg/dL (ref 15–143)

## 2023-07-28 LAB — KAPPA/LAMBDA LIGHT CHAINS
Kappa free light chain: 41.1 mg/L — ABNORMAL HIGH (ref 3.3–19.4)
Kappa, lambda light chain ratio: 2.85 — ABNORMAL HIGH (ref 0.26–1.65)
Lambda free light chains: 14.4 mg/L (ref 5.7–26.3)

## 2023-07-29 ENCOUNTER — Other Ambulatory Visit: Payer: Self-pay

## 2023-07-29 ENCOUNTER — Ambulatory Visit: Payer: Medicare PPO | Admitting: Cardiovascular Disease

## 2023-07-30 ENCOUNTER — Other Ambulatory Visit: Payer: Self-pay | Admitting: Hematology & Oncology

## 2023-07-30 DIAGNOSIS — D472 Monoclonal gammopathy: Secondary | ICD-10-CM

## 2023-07-31 ENCOUNTER — Encounter: Payer: Self-pay | Admitting: Hematology & Oncology

## 2023-08-01 ENCOUNTER — Inpatient Hospital Stay: Payer: Medicare PPO

## 2023-08-01 DIAGNOSIS — D472 Monoclonal gammopathy: Secondary | ICD-10-CM

## 2023-08-01 DIAGNOSIS — C9000 Multiple myeloma not having achieved remission: Secondary | ICD-10-CM | POA: Diagnosis not present

## 2023-08-05 LAB — PROTEIN ELECTROPHORESIS, SERUM, WITH REFLEX
A/G Ratio: 2.2 — ABNORMAL HIGH (ref 0.7–1.7)
Albumin ELP: 3.9 g/dL (ref 2.9–4.4)
Alpha-1-Globulin: 0.2 g/dL (ref 0.0–0.4)
Alpha-2-Globulin: 0.4 g/dL (ref 0.4–1.0)
Beta Globulin: 0.6 g/dL — ABNORMAL LOW (ref 0.7–1.3)
Gamma Globulin: 0.6 g/dL (ref 0.4–1.8)
Globulin, Total: 1.8 g/dL — ABNORMAL LOW (ref 2.2–3.9)
M-Spike, %: 0.3 g/dL — ABNORMAL HIGH
SPEP Interpretation: 0
Total Protein ELP: 5.7 g/dL — ABNORMAL LOW (ref 6.0–8.5)

## 2023-08-05 LAB — IMMUNOFIXATION REFLEX, SERUM
IgA: 47 mg/dL — ABNORMAL LOW (ref 61–437)
IgG (Immunoglobin G), Serum: 822 mg/dL (ref 603–1613)
IgM (Immunoglobulin M), Srm: 26 mg/dL (ref 15–143)

## 2023-08-07 LAB — UPEP/UIFE/LIGHT CHAINS/TP, 24-HR UR
% BETA, Urine: 32.3 %
ALPHA 1 URINE: 4.2 %
Albumin, U: 46.1 %
Alpha 2, Urine: 9.3 %
Free Kappa Lt Chains,Ur: 81.73 mg/L (ref 1.17–86.46)
Free Kappa/Lambda Ratio: 15.31 — ABNORMAL HIGH (ref 1.83–14.26)
Free Lambda Lt Chains,Ur: 5.34 mg/L (ref 0.27–15.21)
GAMMA GLOBULIN URINE: 8 %
M-SPIKE %, Urine: 13.7 % — ABNORMAL HIGH
M-Spike, Mg/24 Hr: 22 mg/(24.h) — ABNORMAL HIGH
Total Protein, Urine-Ur/day: 160 mg/(24.h) — ABNORMAL HIGH (ref 30–150)
Total Protein, Urine: 18.8 mg/dL
Total Volume: 850

## 2023-08-14 ENCOUNTER — Inpatient Hospital Stay (HOSPITAL_BASED_OUTPATIENT_CLINIC_OR_DEPARTMENT_OTHER): Payer: Medicare PPO | Admitting: Hematology & Oncology

## 2023-08-14 ENCOUNTER — Encounter: Payer: Self-pay | Admitting: Hematology & Oncology

## 2023-08-14 ENCOUNTER — Inpatient Hospital Stay: Payer: Medicare PPO

## 2023-08-14 ENCOUNTER — Inpatient Hospital Stay: Payer: Medicare PPO | Attending: Hematology & Oncology

## 2023-08-14 VITALS — BP 115/69 | HR 63 | Temp 97.6°F | Resp 20 | Ht 67.0 in | Wt 182.0 lb

## 2023-08-14 DIAGNOSIS — D61818 Other pancytopenia: Secondary | ICD-10-CM | POA: Insufficient documentation

## 2023-08-14 DIAGNOSIS — C9000 Multiple myeloma not having achieved remission: Secondary | ICD-10-CM | POA: Insufficient documentation

## 2023-08-14 DIAGNOSIS — K5900 Constipation, unspecified: Secondary | ICD-10-CM | POA: Insufficient documentation

## 2023-08-14 LAB — CBC WITH DIFFERENTIAL (CANCER CENTER ONLY)
Abs Immature Granulocytes: 0.02 10*3/uL (ref 0.00–0.07)
Basophils Absolute: 0 10*3/uL (ref 0.0–0.1)
Basophils Relative: 0 %
Eosinophils Absolute: 0 10*3/uL (ref 0.0–0.5)
Eosinophils Relative: 1 %
HCT: 34.7 % — ABNORMAL LOW (ref 39.0–52.0)
Hemoglobin: 12.2 g/dL — ABNORMAL LOW (ref 13.0–17.0)
Immature Granulocytes: 1 %
Lymphocytes Relative: 28 %
Lymphs Abs: 1.2 10*3/uL (ref 0.7–4.0)
MCH: 34.8 pg — ABNORMAL HIGH (ref 26.0–34.0)
MCHC: 35.2 g/dL (ref 30.0–36.0)
MCV: 98.9 fL (ref 80.0–100.0)
Monocytes Absolute: 0.3 10*3/uL (ref 0.1–1.0)
Monocytes Relative: 7 %
Neutro Abs: 2.6 10*3/uL (ref 1.7–7.7)
Neutrophils Relative %: 63 %
Platelet Count: 87 10*3/uL — ABNORMAL LOW (ref 150–400)
RBC: 3.51 MIL/uL — ABNORMAL LOW (ref 4.22–5.81)
RDW: 13.2 % (ref 11.5–15.5)
WBC Count: 4.2 10*3/uL (ref 4.0–10.5)
nRBC: 0 % (ref 0.0–0.2)

## 2023-08-14 LAB — CMP (CANCER CENTER ONLY)
ALT: 13 U/L (ref 0–44)
AST: 32 U/L (ref 15–41)
Albumin: 4.4 g/dL (ref 3.5–5.0)
Alkaline Phosphatase: 80 U/L (ref 38–126)
Anion gap: 8 (ref 5–15)
BUN: 23 mg/dL (ref 8–23)
CO2: 30 mmol/L (ref 22–32)
Calcium: 9.1 mg/dL (ref 8.9–10.3)
Chloride: 102 mmol/L (ref 98–111)
Creatinine: 1.1 mg/dL (ref 0.61–1.24)
GFR, Estimated: >60 mL/min (ref >60)
Glucose, Bld: 115 mg/dL — ABNORMAL HIGH (ref 70–99)
Potassium: 4 mmol/L (ref 3.5–5.1)
Sodium: 140 mmol/L (ref 135–145)
Total Bilirubin: 1.3 mg/dL — ABNORMAL HIGH (ref 0.0–1.2)
Total Protein: 6.2 g/dL — ABNORMAL LOW (ref 6.5–8.1)

## 2023-08-14 LAB — LACTATE DEHYDROGENASE: LDH: 549 U/L — ABNORMAL HIGH (ref 98–192)

## 2023-08-14 NOTE — Progress Notes (Signed)
 Hematology and Oncology Follow Up Visit  Riley Eaton [id number] 10/12/1940 83 y.o. 08/14/2023   Principle Diagnosis:  IgG Kappa myeloma  -- +1q and t(11:14)   Current Therapy:        Velcade /Decadron  -- (3 wk on/1 wk off) -- s/p cycle #12 -- started on 08/24/2021-DC on 03/06/2023  Riley Eaton - s/p cycle 6 -  started on 04/30/2022-DC on 03/06/2023     Interim History:  Riley Eaton is here today for follow-up.  He has now been off treatment for 5 months.  He has done incredibly well.  His myeloma levels really have not progressed.  He feels good.  He is getting around little bit better.  When we last saw him, his monoclonal spike was 0.3 g/dL.  His IgG level was 740 mg/dL.  This is holding steady.  His kappa light chain was 4.1 mg/dL.  Everything is holding nice and stable and low.    His appetite has been good.  He had a very nice Christmas and New Years.  He enjoyed being with his family.   Thankfully, he has had no problems with COVID.  He has had no cough or shortness of breath.  He has had no change in bowel or bladder habits.  He has had no rashes.  He has had no tingling in the hands or feet.  Overall, I would say that his performance status is probably ECOG 1.  Thanksgiving with his family.  He has had no fever.  He has had no cough.  He has had no bleeding.  There has been no change in bowel or bladder habits.  He has always had constipation.  Currently, I would say that his performance status is probably ECOG 2.      Wt Readings from Last 3 Encounters:  08/14/23 182 lb (82.6 kg)  07/25/23 183 lb (83 kg)  05/29/23 190 lb (86.2 kg)     Medications:  Allergies as of 08/14/2023   No Known Allergies      Medication List        Accurate as of August 14, 2023 10:57 AM. If you have any questions, ask your nurse or doctor.          acetaminophen  325 MG tablet Commonly known as: TYLENOL  Take 2 tablets (650 mg total) by mouth every 6 (six) hours as needed for mild pain  (or temp > 100).   allopurinol  100 MG tablet Commonly known as: ZYLOPRIM  TAKE 1 TABLET BY MOUTH EVERY DAY   aspirin  81 MG chewable tablet Chew 1 tablet (81 mg total) by mouth daily.   atorvastatin  40 MG tablet Commonly known as: LIPITOR Take 1 tablet (40 mg total) by mouth daily at 6 PM. What changed: when to take this   carvedilol  6.25 MG tablet Commonly known as: COREG  Take 1 tablet by mouth 2 (two) times daily.   colchicine 0.6 MG tablet   cyanocobalamin 500 MCG tablet Commonly known as: VITAMIN B12 Take 500 mcg by mouth daily.   diphenhydramine -acetaminophen  25-500 MG Tabs tablet Commonly known as: TYLENOL  PM Take 1 tablet by mouth at bedtime.   finasteride  5 MG tablet Commonly known as: PROSCAR  Take 1 tablet (5 mg total) by mouth daily. For urination   furosemide  40 MG tablet Commonly known as: LASIX  TAKE 1 TABLET BY MOUTH EVERY DAY   HYDROcodone -acetaminophen  7.5-325 MG tablet Commonly known as: NORCO Take 1 tablet by mouth 4 (four) times daily as needed for moderate pain.  LORazepam  0.5 MG tablet Commonly known as: ATIVAN  TAKE 1 TABLET (0.5 MG TOTAL) BY MOUTH EVERY 6 (SIX) HOURS AS NEEDED (NAUSEA OR VOMITING).   losartan  25 MG tablet Commonly known as: COZAAR  Take 25 mg by mouth daily.   Movantik  25 MG Tabs tablet Generic drug: naloxegol  oxalate Take 25 mg by mouth daily.   ondansetron  4 MG disintegrating tablet Commonly known as: Zofran  ODT Take 1 tablet (4 mg total) by mouth every 8 (eight) hours as needed for nausea or vomiting.   pantoprazole  40 MG tablet Commonly known as: PROTONIX  Take 1 tablet (40 mg total) by mouth at bedtime. What changed: when to take this   polyethylene glycol 17 g packet Commonly known as: MIRALAX  / GLYCOLAX  Take 17 g by mouth daily.   pregabalin  150 MG capsule Commonly known as: LYRICA  Take 150 mg by mouth 2 (two) times daily.   traZODone  50 MG tablet Commonly known as: DESYREL  Take 50 mg by mouth at  bedtime.        Allergies: No Known Allergies  Past Medical History, Surgical history, Social history, and Family History were reviewed and updated.  Review of Systems: Review of Systems  Constitutional: Negative.   HENT: Negative.    Eyes: Negative.   Respiratory: Negative.    Cardiovascular: Negative.   Gastrointestinal: Negative.   Genitourinary: Negative.   Musculoskeletal:  Positive for back pain.  Skin: Negative.   Neurological: Negative.   Endo/Heme/Allergies: Negative.   Psychiatric/Behavioral: Negative.     Riley Eaton   Physical Exam:  height is 5' 7 (1.702 m) and weight is 182 lb (82.6 kg). His oral temperature is 97.6 F (36.4 C). His blood pressure is 115/69 and his pulse is 63. His respiration is 20 and oxygen  saturation is 98%.   Wt Readings from Last 3 Encounters:  08/14/23 182 lb (82.6 kg)  07/25/23 183 lb (83 kg)  05/29/23 190 lb (86.2 kg)   Physical Exam Vitals reviewed.  HENT:     Head: Normocephalic and atraumatic.  Eyes:     Pupils: Pupils are equal, round, and reactive to light.  Cardiovascular:     Rate and Rhythm: Normal rate and regular rhythm.     Heart sounds: Normal heart sounds.  Pulmonary:     Effort: Pulmonary effort is normal.     Breath sounds: Normal breath sounds.  Abdominal:     General: Bowel sounds are normal.     Palpations: Abdomen is soft.  Musculoskeletal:        General: No tenderness or deformity. Normal range of motion.     Cervical back: Normal range of motion.  Lymphadenopathy:     Cervical: No cervical adenopathy.  Skin:    General: Skin is warm and dry.     Findings: No erythema or rash.  Neurological:     Mental Status: He is alert and oriented to person, place, and time.  Psychiatric:        Behavior: Behavior normal.        Thought Content: Thought content normal.        Judgment: Judgment normal.      Lab Results  Component Value Date   WBC 4.2 08/14/2023   HGB 12.2 (L) 08/14/2023   HCT 34.7 (L)  08/14/2023   MCV 98.9 08/14/2023   PLT 87 (L) 08/14/2023   No results found for: FERRITIN, IRON, TIBC, UIBC, IRONPCTSAT Lab Results  Component Value Date   RBC 3.51 (L) 08/14/2023   Lab  Results  Component Value Date   KPAFRELGTCHN 41.1 (H) 07/25/2023   LAMBDASER 14.4 07/25/2023   KAPLAMBRATIO 15.31 (H) 08/01/2023   Lab Results  Component Value Date   IGGSERUM 678 07/25/2023   IGGSERUM 822 07/25/2023   IGA 41 (L) 07/25/2023   IGA 47 (L) 07/25/2023   IGMSERUM 30 07/25/2023   IGMSERUM 26 07/25/2023   Lab Results  Component Value Date   TOTALPROTELP 5.7 (L) 07/25/2023   ALBUMINELP 3.9 07/25/2023   A1GS 0.2 07/25/2023   A2GS 0.4 07/25/2023   BETS 0.6 (L) 07/25/2023   GAMS 0.6 07/25/2023   MSPIKE 0.3 (H) 07/25/2023   SPEI Comment 05/29/2023     Chemistry      Component Value Date/Time   NA 140 08/14/2023 0947   NA 142 10/15/2019 1500   K 4.0 08/14/2023 0947   CL 102 08/14/2023 0947   CO2 30 08/14/2023 0947   BUN 23 08/14/2023 0947   BUN 25 10/15/2019 1500   CREATININE 1.10 08/14/2023 0947      Component Value Date/Time   CALCIUM  9.1 08/14/2023 0947   ALKPHOS 80 08/14/2023 0947   AST 32 08/14/2023 0947   ALT 13 08/14/2023 0947   BILITOT 1.3 (H) 08/14/2023 0947        Impression and Plan: Mr. Ramsay is a very pleasant 83 yo caucasian gentleman with IgG Kappa myeloma. He is currently off of treatment given pancytopenia.  Currently, his blood counts are still about the same.  His white cell count is a little bit better.  His platelet count is also a little bit better.  I really think that he has been a good idea to keep him off treatment.  Will try to keep him off treatment as long as possible.  We will plan to get him back in 6 weeks.  I think this would be a reasonable.   Maude JONELLE Crease, MD 1/2/202510:57 AM

## 2023-08-15 LAB — IGG, IGA, IGM
IgA: 44 mg/dL — ABNORMAL LOW (ref 61–437)
IgG (Immunoglobin G), Serum: 728 mg/dL (ref 603–1613)
IgM (Immunoglobulin M), Srm: 28 mg/dL (ref 15–143)

## 2023-08-15 LAB — KAPPA/LAMBDA LIGHT CHAINS
Kappa free light chain: 49 mg/L — ABNORMAL HIGH (ref 3.3–19.4)
Kappa, lambda light chain ratio: 3.27 — ABNORMAL HIGH (ref 0.26–1.65)
Lambda free light chains: 15 mg/L (ref 5.7–26.3)

## 2023-08-25 LAB — IMMUNOFIXATION REFLEX, SERUM
IgA: 51 mg/dL — ABNORMAL LOW (ref 61–437)
IgG (Immunoglobin G), Serum: 824 mg/dL (ref 603–1613)
IgM (Immunoglobulin M), Srm: 35 mg/dL (ref 15–143)

## 2023-08-25 LAB — PROTEIN ELECTROPHORESIS, SERUM, WITH REFLEX
A/G Ratio: 2 — ABNORMAL HIGH (ref 0.7–1.7)
Albumin ELP: 3.9 g/dL (ref 2.9–4.4)
Alpha-1-Globulin: 0.2 g/dL (ref 0.0–0.4)
Alpha-2-Globulin: 0.4 g/dL (ref 0.4–1.0)
Beta Globulin: 0.7 g/dL (ref 0.7–1.3)
Gamma Globulin: 0.6 g/dL (ref 0.4–1.8)
Globulin, Total: 2 g/dL — ABNORMAL LOW (ref 2.2–3.9)
M-Spike, %: 0.3 g/dL — ABNORMAL HIGH
SPEP Interpretation: 0
Total Protein ELP: 5.9 g/dL — ABNORMAL LOW (ref 6.0–8.5)

## 2023-09-24 ENCOUNTER — Ambulatory Visit: Payer: Medicare PPO | Attending: Cardiovascular Disease | Admitting: Cardiovascular Disease

## 2023-09-24 ENCOUNTER — Encounter: Payer: Self-pay | Admitting: Cardiovascular Disease

## 2023-09-24 VITALS — BP 98/54 | HR 48 | Ht 67.0 in | Wt 179.8 lb

## 2023-09-24 DIAGNOSIS — I5022 Chronic systolic (congestive) heart failure: Secondary | ICD-10-CM

## 2023-09-24 DIAGNOSIS — E782 Mixed hyperlipidemia: Secondary | ICD-10-CM

## 2023-09-24 DIAGNOSIS — Z136 Encounter for screening for cardiovascular disorders: Secondary | ICD-10-CM | POA: Diagnosis not present

## 2023-09-24 DIAGNOSIS — I7121 Aneurysm of the ascending aorta, without rupture: Secondary | ICD-10-CM | POA: Diagnosis not present

## 2023-09-24 MED ORDER — CARVEDILOL 6.25 MG PO TABS: 6.2500 mg | ORAL_TABLET | Freq: Two times a day (BID) | ORAL | 3 refills | Status: DC

## 2023-09-24 NOTE — Assessment & Plan Note (Addendum)
History of hyperlipidemia on atorvastatin with lipid profile performed 3 months ago revealed total cholesterol 93, LDL 38 and HDL of 36.

## 2023-09-24 NOTE — Progress Notes (Signed)
09/24/2023 [name]   1941/05/17  [id number]  Primary Physician Richmond Campbell., PA-C Primary Cardiologist: Runell Gess MD Milagros Loll, [place], [place]  HPI:  Riley Eaton is a 83 y.o.  mildly overweight married Caucasian male  father of 3, grandfather of 4 grandchildren. Retired from being in the [employer]. He is referred by Karmen Stabs PA-C at [facility] for cardiovascular evaluation because of an episode of shortness of breath, and an incidentally noted thoracic aortic aneurysm. I last saw him in the office 01/30/2022.[name] He basically has no cardiac risk factors. He's never had a heart attack or stroke. He denies chest pain or shortness of breath. A Myoview stress test was performed 10/20/15 which was entirely normal. A CT scan revealed a thoracic aortic aneurysm measuring 4.1 cm.   He  was admitted in early December for altered mental status.  He did have a troponin of 2100 and EKG changes.  2D echo revealed EF of 30% which is new compared to the EF on Myoview stress test performed 10/20/2015 which was normal.  He was not a candidate for cardiac catheterization because of severe renal insufficiency nor he is a candidate for coronary CTA because of this as well.  He gets occasional although infrequent chest pain.  2D echo did not comment on thoracic aortic aneurysm.   Since I saw him a month ago he is noticed increasing dyspnea on exertion and orthopnea.  He denies chest pain.  He was scheduled for coronary CTA but this will be canceled because of his severe renal insufficiency.   He underwent TURP by Dr. Annabell Howells 10/26/2019 and since that time his renal function has normalized.  He denies chest pain or shortness of breath.  His echo performed 07/14/2019 did not mention dilatation of his ascending thoracic aorta, although recent echo performed 03/20/2020 suggested his ascending thoracic aorta measured 41 mm.   He was hospitalized in late December 2021 with abdominal pain and  ultimately underwent cholecystectomy.  He is felt well since that time.  He denies chest pain or shortness of breath.  Since I saw him a year and a half ago he continues to do well.  He is completely asymptomatic.  His most recent 2D echo performed 02/25/2023 revealed normal LV systolic function, grade 1 diastolic dysfunction and an ascending aorta measuring 41 mm which is stable.  He gets out of the house somewhat but is limited by back pain.   Current Meds  Medication Sig   acetaminophen (TYLENOL) 325 MG tablet Take 2 tablets (650 mg total) by mouth every 6 (six) hours as needed for mild pain (or temp > 100).   allopurinol (ZYLOPRIM) 100 MG tablet TAKE 1 TABLET BY MOUTH EVERY DAY (Patient taking differently: Take 100 mg by mouth daily.)   aspirin 81 MG chewable tablet Chew 1 tablet (81 mg total) by mouth daily.   atorvastatin (LIPITOR) 40 MG tablet Take 1 tablet (40 mg total) by mouth daily at 6 PM. (Patient taking differently: Take 40 mg by mouth at bedtime.)   carvedilol (COREG) 6.25 MG tablet Take 1 tablet by mouth 2 (two) times daily.   colchicine 0.6 MG tablet    diphenhydramine-acetaminophen (TYLENOL PM) 25-500 MG TABS tablet Take 1 tablet by mouth at bedtime.   finasteride (PROSCAR) 5 MG tablet Take 1 tablet (5 mg total) by mouth daily. For urination   furosemide (LASIX) 40 MG tablet TAKE 1 TABLET BY MOUTH EVERY DAY   HYDROcodone-acetaminophen (  NORCO) 7.5-325 MG tablet Take 1 tablet by mouth 4 (four) times daily as needed for moderate pain.   LORazepam (ATIVAN) 0.5 MG tablet TAKE 1 TABLET (0.5 MG TOTAL) BY MOUTH EVERY 6 (SIX) HOURS AS NEEDED (NAUSEA OR VOMITING).   losartan (COZAAR) 25 MG tablet Take 25 mg by mouth daily.   MOVANTIK 25 MG TABS tablet Take 25 mg by mouth daily.   ondansetron (ZOFRAN ODT) 4 MG disintegrating tablet Take 1 tablet (4 mg total) by mouth every 8 (eight) hours as needed for nausea or vomiting.   pantoprazole (PROTONIX) 40 MG tablet Take 1 tablet (40 mg total)  by mouth at bedtime. (Patient taking differently: Take 40 mg by mouth daily.)   polyethylene glycol (MIRALAX / GLYCOLAX) 17 g packet Take 17 g by mouth daily.   pregabalin (LYRICA) 150 MG capsule Take 150 mg by mouth 2 (two) times daily.   traZODone (DESYREL) 50 MG tablet Take 50 mg by mouth at bedtime.   vitamin B-12 (CYANOCOBALAMIN) 500 MCG tablet Take 500 mcg by mouth daily.     No Known Allergies  Social History   Socioeconomic History   Marital status: Married    Spouse name: Not on file   Number of children: 3   Years of education: 12   Highest education level: Not on file  Occupational History   Occupation: retired  Tobacco Use   Smoking status: Former    Current packs/day: 0.00    Average packs/day: 1 pack/day for 20.0 years (20.0 ttl pk-yrs)    Types: Cigarettes    Start date: 46    Quit date: 1976    Years since quitting: 49.1   Smokeless tobacco: Former    Quit date: 12/12/1979   Tobacco comments:    quit 45 years ago  Vaping Use   Vaping status: Never Used  Substance and Sexual Activity   Alcohol use: No   Drug use: No   Sexual activity: Not Currently  Other Topics Concern   Not on file  Social History Narrative   Lives with wife   Caffeine use: Drinks 6 cups coffee per day   Right handed    Social Drivers of Health   Financial Resource Strain: Not on file  Food Insecurity: Not on file  Transportation Needs: Not on file  Physical Activity: Not on file  Stress: No Stress Concern Present (09/28/2020)   Received from [facility], [facility]   [employer] [facility] - Occupational Stress Questionnaire    Feeling of Stress : Not at all  Social Connections: Unknown (12/24/2021)   Received from [facility], [facility]   Social Network    Social Network: Not on file  Intimate Partner Violence: Unknown (11/15/2021)   Received from [facility], [facility]   HITS    Physically Hurt: Not on file    Insult or Talk Down To: Not  on file    Threaten Physical Harm: Not on file    Scream or Curse: Not on file     Review of Systems: General: negative for chills, fever, night sweats or weight changes.  Cardiovascular: negative for chest pain, dyspnea on exertion, edema, orthopnea, palpitations, paroxysmal nocturnal dyspnea or shortness of breath Dermatological: negative for rash Respiratory: negative for cough or wheezing Urologic: negative for hematuria Abdominal: negative for nausea, vomiting, diarrhea, bright red blood per rectum, melena, or hematemesis Neurologic: negative for visual changes, syncope, or dizziness All other systems reviewed and are otherwise negative  except as noted above.    Blood pressure (!) 98/54, pulse (!) 48, height 5\' 7"  (1.702 m), weight 179 lb 12.8 oz (81.6 kg), SpO2 96%.  General appearance: alert and no distress Neck: no adenopathy, no carotid bruit, no JVD, supple, symmetrical, trachea midline, and thyroid not enlarged, symmetric, no tenderness/mass/nodules Lungs: clear to auscultation bilaterally Heart: regular rate and rhythm, S1, S2 normal, no murmur, click, rub or gallop Extremities: extremities normal, atraumatic, no cyanosis or edema Pulses: 2+ and symmetric Skin: Skin color, texture, turgor normal. No rashes or lesions Neurologic: Grossly normal  EKG EKG Interpretation Date/Time:  Wednesday September 24 2023 11:29:58 EST Ventricular Rate:  48 PR Interval:  286 QRS Duration:  96 QT Interval:  466 QTC Calculation: 416 R Axis:   17  Text Interpretation: Sinus bradycardia with 1st degree A-V block When compared with ECG of 06-Aug-2020 04:10, Vent. 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  His most recent echo however performed  02/25/2023 revealed an EF of 60 to 65% with grade 1 diastolic dysfunction.  He had a mildly dilated aorta measuring 41 mm.  He is on carvedilol and losartan.  He is completely asymptomatic.  Thoracic aortic aneurysm (HCC) History of small thoracic aortic aneurysm measuring 41 mm by 2D echo performed 02/25/2023.  No further evaluation is warranted at this time given the patient's age.  Hyperlipidemia History of hyperlipidemia on atorvastatin with lipid profile performed 3 months ago revealed total cholesterol 93, LDL 38 and HDL of 36.       Runell Gess MD FACP,FACC,FAHA, [facility] 09/24/2023 12:04 PM

## 2023-09-24 NOTE — Patient Instructions (Signed)

## 2023-09-24 NOTE — Assessment & Plan Note (Signed)
History of small thoracic aortic aneurysm measuring 41 mm by 2D echo performed 02/25/2023.  No further evaluation is warranted at this time given the patient's age.

## 2023-09-24 NOTE — Assessment & Plan Note (Signed)
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  His most recent echo however performed 02/25/2023 revealed an EF of 60 to 65% with grade 1 diastolic dysfunction.  He had a mildly dilated aorta measuring 41 mm.  He is on carvedilol and losartan.  He is completely asymptomatic.

## 2023-09-26 ENCOUNTER — Encounter: Payer: Self-pay | Admitting: Hematology & Oncology

## 2023-09-26 ENCOUNTER — Inpatient Hospital Stay: Payer: Medicare PPO | Attending: Hematology & Oncology

## 2023-09-26 ENCOUNTER — Inpatient Hospital Stay: Payer: Medicare PPO | Admitting: Hematology & Oncology

## 2023-09-26 VITALS — BP 98/66 | HR 57 | Temp 97.6°F | Ht 67.0 in | Wt 178.4 lb

## 2023-09-26 DIAGNOSIS — D61818 Other pancytopenia: Secondary | ICD-10-CM | POA: Diagnosis not present

## 2023-09-26 DIAGNOSIS — C9000 Multiple myeloma not having achieved remission: Secondary | ICD-10-CM

## 2023-09-26 LAB — CBC WITH DIFFERENTIAL (CANCER CENTER ONLY)
Abs Immature Granulocytes: 0.02 10*3/uL (ref 0.00–0.07)
Basophils Absolute: 0 10*3/uL (ref 0.0–0.1)
Basophils Relative: 0 %
Eosinophils Absolute: 0 10*3/uL (ref 0.0–0.5)
Eosinophils Relative: 1 %
HCT: 35.3 % — ABNORMAL LOW (ref 39.0–52.0)
Hemoglobin: 12.5 g/dL — ABNORMAL LOW (ref 13.0–17.0)
Immature Granulocytes: 1 %
Lymphocytes Relative: 27 %
Lymphs Abs: 0.9 10*3/uL (ref 0.7–4.0)
MCH: 35 pg — ABNORMAL HIGH (ref 26.0–34.0)
MCHC: 35.4 g/dL (ref 30.0–36.0)
MCV: 98.9 fL (ref 80.0–100.0)
Monocytes Absolute: 0.2 10*3/uL (ref 0.1–1.0)
Monocytes Relative: 7 %
Neutro Abs: 2.1 10*3/uL (ref 1.7–7.7)
Neutrophils Relative %: 64 %
Platelet Count: 85 10*3/uL — ABNORMAL LOW (ref 150–400)
RBC: 3.57 MIL/uL — ABNORMAL LOW (ref 4.22–5.81)
RDW: 12.4 % (ref 11.5–15.5)
WBC Count: 3.3 10*3/uL — ABNORMAL LOW (ref 4.0–10.5)
nRBC: 0 % (ref 0.0–0.2)

## 2023-09-26 LAB — CMP (CANCER CENTER ONLY)
ALT: 13 U/L (ref 0–44)
AST: 26 U/L (ref 15–41)
Albumin: 4.5 g/dL (ref 3.5–5.0)
Alkaline Phosphatase: 98 U/L (ref 38–126)
Anion gap: 8 (ref 5–15)
BUN: 19 mg/dL (ref 8–23)
CO2: 30 mmol/L (ref 22–32)
Calcium: 9.3 mg/dL (ref 8.9–10.3)
Chloride: 103 mmol/L (ref 98–111)
Creatinine: 1.01 mg/dL (ref 0.61–1.24)
GFR, Estimated: >60 mL/min (ref >60)
Glucose, Bld: 104 mg/dL — ABNORMAL HIGH (ref 70–99)
Potassium: 3.8 mmol/L (ref 3.5–5.1)
Sodium: 141 mmol/L (ref 135–145)
Total Bilirubin: 1.2 mg/dL (ref 0.0–1.2)
Total Protein: 6.2 g/dL — ABNORMAL LOW (ref 6.5–8.1)

## 2023-09-26 LAB — LACTATE DEHYDROGENASE: LDH: 377 U/L — ABNORMAL HIGH (ref 98–192)

## 2023-09-26 NOTE — Progress Notes (Signed)
Hematology and Oncology Follow Up Visit  Riley Eaton [id number] Dec 27, 1940 83 y.o. 09/26/2023   Principle Diagnosis:  IgG Kappa myeloma  -- +1q and t(11:14)   Current Therapy:        Velcade/Decadron -- (3 wk on/1 wk off) -- s/p cycle #12 -- started on 08/24/2021-DC on 03/06/2023  Faspro - s/p cycle 6 -  started on 04/30/2022-DC on 03/06/2023     Interim History:  Riley Eaton is here today for follow-up.  He has now been off treatment for 7 months.  He has done incredibly well.  His myeloma levels really have not progressed.  He feels good.  He is getting around little bit better.  When we last saw him, his monoclonal spike was 0.3 g/dL.  His IgG level was 740 mg/dL.  This is holding steady.  His kappa light chain was 4.9 mg/dL.  Everything is holding nice and stable and low.    His appetite has been good.  He has had no nausea or vomiting.  He has had no change in bowel or bladder habits.  He has had no rashes.  There has been no bleeding.  He has had no issues with COVID.  He has had no cough or shortness of breath.  Overall, I would say his performance status is probably ECOG 2.      Wt Readings from Last 3 Encounters:  09/26/23 178 lb 6.4 oz (80.9 kg)  09/24/23 179 lb 12.8 oz (81.6 kg)  08/14/23 182 lb (82.6 kg)     Medications:  Allergies as of 09/26/2023   No Known Allergies      Medication List        Accurate as of September 26, 2023 11:52 AM. If you have any questions, ask your nurse or doctor.          acetaminophen 325 MG tablet Commonly known as: TYLENOL Take 2 tablets (650 mg total) by mouth every 6 (six) hours as needed for mild pain (or temp > 100).   allopurinol 100 MG tablet Commonly known as: ZYLOPRIM TAKE 1 TABLET BY MOUTH EVERY DAY   aspirin 81 MG chewable tablet Chew 1 tablet (81 mg total) by mouth daily.   atorvastatin 40 MG tablet Commonly known as: LIPITOR Take 1 tablet (40 mg total) by mouth daily at 6 PM. What changed: when to  take this   carvedilol 6.25 MG tablet Commonly known as: COREG Take 1 tablet (6.25 mg total) by mouth 2 (two) times daily.   colchicine 0.6 MG tablet   cyanocobalamin 500 MCG tablet Commonly known as: VITAMIN B12 Take 500 mcg by mouth daily.   diphenhydramine-acetaminophen 25-500 MG Tabs tablet Commonly known as: TYLENOL PM Take 1 tablet by mouth at bedtime.   finasteride 5 MG tablet Commonly known as: PROSCAR Take 1 tablet (5 mg total) by mouth daily. For urination   furosemide 40 MG tablet Commonly known as: LASIX TAKE 1 TABLET BY MOUTH EVERY DAY   HYDROcodone-acetaminophen 7.5-325 MG tablet Commonly known as: NORCO Take 1 tablet by mouth 4 (four) times daily as needed for moderate pain.   LORazepam 0.5 MG tablet Commonly known as: ATIVAN TAKE 1 TABLET (0.5 MG TOTAL) BY MOUTH EVERY 6 (SIX) HOURS AS NEEDED (NAUSEA OR VOMITING).   losartan 25 MG tablet Commonly known as: COZAAR Take 25 mg by mouth daily.   Movantik 25 MG Tabs tablet Generic drug: naloxegol oxalate Take 25 mg by mouth daily.   ondansetron 4 MG  disintegrating tablet Commonly known as: Zofran ODT Take 1 tablet (4 mg total) by mouth every 8 (eight) hours as needed for nausea or vomiting.   pantoprazole 40 MG tablet Commonly known as: PROTONIX Take 1 tablet (40 mg total) by mouth at bedtime. What changed: when to take this   polyethylene glycol 17 g packet Commonly known as: MIRALAX / GLYCOLAX Take 17 g by mouth daily.   pregabalin 150 MG capsule Commonly known as: LYRICA Take 150 mg by mouth 2 (two) times daily.   traZODone 50 MG tablet Commonly known as: DESYREL Take 50 mg by mouth at bedtime.        Allergies: No Known Allergies  Past Medical History, Surgical history, Social history, and Family History were reviewed and updated.  Review of Systems: Review of Systems  Constitutional: Negative.   HENT: Negative.    Eyes: Negative.   Respiratory: Negative.    Cardiovascular:  Negative.   Gastrointestinal: Negative.   Genitourinary: Negative.   Musculoskeletal:  Positive for back pain.  Skin: Negative.   Neurological: Negative.   Endo/Heme/Allergies: Negative.   Psychiatric/Behavioral: Negative.     [name]   Physical Exam:  height is 5\' 7"  (1.702 m) and weight is 178 lb 6.4 oz (80.9 kg). His oral temperature is 97.6 F (36.4 C). His blood pressure is 98/66 and his pulse is 57 (abnormal). His oxygen saturation is 100%.   Wt Readings from Last 3 Encounters:  09/26/23 178 lb 6.4 oz (80.9 kg)  09/24/23 179 lb 12.8 oz (81.6 kg)  08/14/23 182 lb (82.6 kg)   Physical Exam Vitals reviewed.  HENT:     Head: Normocephalic and atraumatic.  Eyes:     Pupils: Pupils are equal, round, and reactive to light.  Cardiovascular:     Rate and Rhythm: Normal rate and regular rhythm.     Heart sounds: Normal heart sounds.  Pulmonary:     Effort: Pulmonary effort is normal.     Breath sounds: Normal breath sounds.  Abdominal:     General: Bowel sounds are normal.     Palpations: Abdomen is soft.  Musculoskeletal:        General: No tenderness or deformity. Normal range of motion.     Cervical back: Normal range of motion.  Lymphadenopathy:     Cervical: No cervical adenopathy.  Skin:    General: Skin is warm and dry.     Findings: No erythema or rash.  Neurological:     Mental Status: He is alert and oriented to person, place, and time.  Psychiatric:        Behavior: Behavior normal.        Thought Content: Thought content normal.        Judgment: Judgment normal.      Lab Results  Component Value Date   WBC 3.3 (L) 09/26/2023   HGB 12.5 (L) 09/26/2023   HCT 35.3 (L) 09/26/2023   MCV 98.9 09/26/2023   PLT 85 (L) 09/26/2023   No results found for: "FERRITIN", "IRON", "TIBC", "UIBC", "IRONPCTSAT" Lab Results  Component Value Date   RBC 3.57 (L) 09/26/2023   Lab Results  Component Value Date   KPAFRELGTCHN 49.0 (H) 08/14/2023   LAMBDASER 15.0  08/14/2023   KAPLAMBRATIO 3.27 (H) 08/14/2023   Lab Results  Component Value Date   IGGSERUM 728 08/14/2023   IGGSERUM 824 08/14/2023   IGA 44 (L) 08/14/2023   IGA 51 (L) 08/14/2023   IGMSERUM 28 08/14/2023  IGMSERUM 35 08/14/2023   Lab Results  Component Value Date   TOTALPROTELP 5.9 (L) 08/14/2023   ALBUMINELP 3.9 08/14/2023   A1GS 0.2 08/14/2023   A2GS 0.4 08/14/2023   BETS 0.7 08/14/2023   GAMS 0.6 08/14/2023   MSPIKE 0.3 (H) 08/14/2023   SPEI Comment 05/29/2023     Chemistry      Component Value Date/Time   NA 141 09/26/2023 1004   NA 142 10/15/2019 1500   K 3.8 09/26/2023 1004   CL 103 09/26/2023 1004   CO2 30 09/26/2023 1004   BUN 19 09/26/2023 1004   BUN 25 10/15/2019 1500   CREATININE 1.01 09/26/2023 1004      Component Value Date/Time   CALCIUM 9.3 09/26/2023 1004   ALKPHOS 98 09/26/2023 1004   AST 26 09/26/2023 1004   ALT 13 09/26/2023 1004   BILITOT 1.2 09/26/2023 1004        Impression and Plan: Riley Eaton is a very pleasant 83 yo caucasian gentleman with IgG Kappa myeloma. He is currently off of treatment given pancytopenia.  His white cell count still on the lower side.  His platelets still on the lower side but holding steady.  I still think we have to keep him off treatment.  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  We still plan to get him back in 6 weeks.  I think as long as his myeloma is steady, and he is asymptomatic, we can hold off on doing any treatment.     Josph Macho, MD 2/14/202511:52 AM

## 2023-09-28 LAB — IGG, IGA, IGM
IgA: 48 mg/dL — ABNORMAL LOW (ref 61–437)
IgG (Immunoglobin G), Serum: 792 mg/dL (ref 603–1613)
IgM (Immunoglobulin M), Srm: 35 mg/dL (ref 15–143)

## 2023-09-29 LAB — KAPPA/LAMBDA LIGHT CHAINS
Kappa free light chain: 44.1 mg/L — ABNORMAL HIGH (ref 3.3–19.4)
Kappa, lambda light chain ratio: 3.15 — ABNORMAL HIGH (ref 0.26–1.65)
Lambda free light chains: 14 mg/L (ref 5.7–26.3)

## 2023-09-30 ENCOUNTER — Telehealth: Payer: Self-pay

## 2023-09-30 NOTE — Telephone Encounter (Signed)
-----   Message from Josph Macho sent at 09/29/2023  4:43 PM EST ----- Please call and let him know that the myeloma labs look very stable.  Great job.  Cindee Lame

## 2023-09-30 NOTE — Telephone Encounter (Signed)
 Pt notified of results

## 2023-10-02 LAB — PROTEIN ELECTROPHORESIS, SERUM, WITH REFLEX
A/G Ratio: 2 — ABNORMAL HIGH (ref 0.7–1.7)
Albumin ELP: 4 g/dL (ref 2.9–4.4)
Alpha-1-Globulin: 0.2 g/dL (ref 0.0–0.4)
Alpha-2-Globulin: 0.4 g/dL (ref 0.4–1.0)
Beta Globulin: 0.7 g/dL (ref 0.7–1.3)
Gamma Globulin: 0.7 g/dL (ref 0.4–1.8)
Globulin, Total: 2 g/dL — ABNORMAL LOW (ref 2.2–3.9)
M-Spike, %: 0.4 g/dL — ABNORMAL HIGH
SPEP Interpretation: 0
Total Protein ELP: 6 g/dL (ref 6.0–8.5)

## 2023-10-02 LAB — IMMUNOFIXATION REFLEX, SERUM
IgA: 51 mg/dL — ABNORMAL LOW (ref 61–437)
IgG (Immunoglobin G), Serum: 850 mg/dL (ref 603–1613)
IgM (Immunoglobulin M), Srm: 36 mg/dL (ref 15–143)

## 2023-11-10 ENCOUNTER — Encounter: Payer: Self-pay | Admitting: Hematology & Oncology

## 2023-11-10 ENCOUNTER — Inpatient Hospital Stay: Payer: Medicare PPO | Admitting: Hematology & Oncology

## 2023-11-10 ENCOUNTER — Inpatient Hospital Stay: Payer: Medicare PPO | Attending: Hematology & Oncology

## 2023-11-10 ENCOUNTER — Other Ambulatory Visit: Payer: Self-pay

## 2023-11-10 VITALS — BP 109/59 | HR 43 | Temp 97.4°F | Resp 17 | Ht 67.0 in | Wt 175.0 lb

## 2023-11-10 DIAGNOSIS — D61818 Other pancytopenia: Secondary | ICD-10-CM | POA: Diagnosis not present

## 2023-11-10 DIAGNOSIS — C9000 Multiple myeloma not having achieved remission: Secondary | ICD-10-CM | POA: Insufficient documentation

## 2023-11-10 LAB — CMP (CANCER CENTER ONLY)
ALT: 15 U/L (ref 0–44)
AST: 28 U/L (ref 15–41)
Albumin: 4.3 g/dL (ref 3.5–5.0)
Alkaline Phosphatase: 95 U/L (ref 38–126)
Anion gap: 7 (ref 5–15)
BUN: 22 mg/dL (ref 8–23)
CO2: 30 mmol/L (ref 22–32)
Calcium: 8.8 mg/dL — ABNORMAL LOW (ref 8.9–10.3)
Chloride: 105 mmol/L (ref 98–111)
Creatinine: 1.05 mg/dL (ref 0.61–1.24)
GFR, Estimated: >60 mL/min (ref >60)
Glucose, Bld: 101 mg/dL — ABNORMAL HIGH (ref 70–99)
Potassium: 4.1 mmol/L (ref 3.5–5.1)
Sodium: 142 mmol/L (ref 135–145)
Total Bilirubin: 0.9 mg/dL (ref 0.0–1.2)
Total Protein: 6.1 g/dL — ABNORMAL LOW (ref 6.5–8.1)

## 2023-11-10 LAB — CBC WITH DIFFERENTIAL (CANCER CENTER ONLY)
Abs Immature Granulocytes: 0.02 10*3/uL (ref 0.00–0.07)
Basophils Absolute: 0 10*3/uL (ref 0.0–0.1)
Basophils Relative: 0 %
Eosinophils Absolute: 0.1 10*3/uL (ref 0.0–0.5)
Eosinophils Relative: 2 %
HCT: 32.4 % — ABNORMAL LOW (ref 39.0–52.0)
Hemoglobin: 11.5 g/dL — ABNORMAL LOW (ref 13.0–17.0)
Immature Granulocytes: 1 %
Lymphocytes Relative: 35 %
Lymphs Abs: 1.1 10*3/uL (ref 0.7–4.0)
MCH: 35 pg — ABNORMAL HIGH (ref 26.0–34.0)
MCHC: 35.5 g/dL (ref 30.0–36.0)
MCV: 98.5 fL (ref 80.0–100.0)
Monocytes Absolute: 0.2 10*3/uL (ref 0.1–1.0)
Monocytes Relative: 7 %
Neutro Abs: 1.8 10*3/uL (ref 1.7–7.7)
Neutrophils Relative %: 55 %
Platelet Count: 76 10*3/uL — ABNORMAL LOW (ref 150–400)
RBC: 3.29 MIL/uL — ABNORMAL LOW (ref 4.22–5.81)
RDW: 12.7 % (ref 11.5–15.5)
WBC Count: 3.2 10*3/uL — ABNORMAL LOW (ref 4.0–10.5)
nRBC: 0 % (ref 0.0–0.2)

## 2023-11-10 LAB — LACTATE DEHYDROGENASE: LDH: 361 U/L — ABNORMAL HIGH (ref 98–192)

## 2023-11-10 NOTE — Progress Notes (Signed)
 Hematology and Oncology Follow Up Visit  Riley Riley [id number] [birth year] 83 y.o. 11/10/2023   Principle Diagnosis:  IgG Kappa myeloma  -- +1q and t(11:14)   Current Therapy:        Velcade/Decadron -- (3 wk on/1 wk off) -- s/p cycle #12 -- started on 08/24/2021-DC on 03/06/2023  Faspro - s/p cycle 6 -  started on 04/30/2022-DC on 03/06/2023     Interim History:  Riley Riley is here today for follow-up.  He has now been off treatment for 9 months.  He has done incredibly well.  His myeloma levels really have not progressed.  He feels good.  He is getting around little bit better.  When we last saw him, his monoclonal spike was 0.4 g/dL.  His IgG level was 850 mg/dL.  This is holding steady.  His kappa light chain was 4.4 mg/dL.  Everything is holding nice and stable and low.    His appetite has been good.  He has had no nausea or vomiting.  He has had no change in bowel or bladder habits.  He has had no rashes.  There has been no bleeding.  He has had no issues with COVID.  He has had no cough or shortness of breath.  Overall, I would say his performance status is probably ECOG 2.      Wt Readings from Last 3 Encounters:  11/10/23 175 lb (79.4 kg)  09/26/23 178 lb 6.4 oz (80.9 kg)  09/24/23 179 lb 12.8 oz (81.6 kg)     Medications:  Allergies as of 11/10/2023   No Known Allergies      Medication List        Accurate as of November 10, 2023 10:49 AM. If you have any questions, ask your nurse or doctor.          acetaminophen 325 MG tablet Commonly known as: TYLENOL Take 2 tablets (650 mg total) by mouth every 6 (six) hours as needed for mild pain (or temp > 100).   allopurinol 100 MG tablet Commonly known as: ZYLOPRIM TAKE 1 TABLET BY MOUTH EVERY DAY   aspirin 81 MG chewable tablet Chew 1 tablet (81 mg total) by mouth daily.   atorvastatin 40 MG tablet Commonly known as: LIPITOR Take 1 tablet (40 mg total) by mouth daily at 6 PM. What changed: when to take  this   carvedilol 6.25 MG tablet Commonly known as: COREG Take 1 tablet (6.25 mg total) by mouth 2 (two) times daily.   colchicine 0.6 MG tablet   cyanocobalamin 500 MCG tablet Commonly known as: VITAMIN B12 Take 500 mcg by mouth daily.   diphenhydramine-acetaminophen 25-500 MG Tabs tablet Commonly known as: TYLENOL PM Take 1 tablet by mouth at bedtime.   finasteride 5 MG tablet Commonly known as: PROSCAR Take 1 tablet (5 mg total) by mouth daily. For urination   furosemide 40 MG tablet Commonly known as: LASIX TAKE 1 TABLET BY MOUTH EVERY DAY   HYDROcodone-acetaminophen 7.5-325 MG tablet Commonly known as: NORCO Take 1 tablet by mouth 4 (four) times daily as needed for moderate pain.   LORazepam 0.5 MG tablet Commonly known as: ATIVAN TAKE 1 TABLET (0.5 MG TOTAL) BY MOUTH EVERY 6 (SIX) HOURS AS NEEDED (NAUSEA OR VOMITING).   losartan 25 MG tablet Commonly known as: COZAAR Take 25 mg by mouth daily.   Movantik 25 MG Tabs tablet Generic drug: naloxegol oxalate Take 25 mg by mouth daily.   ondansetron 4 MG  disintegrating tablet Commonly known as: Zofran ODT Take 1 tablet (4 mg total) by mouth every 8 (eight) hours as needed for nausea or vomiting.   pantoprazole 40 MG tablet Commonly known as: PROTONIX Take 1 tablet (40 mg total) by mouth at bedtime. What changed: when to take this   polyethylene glycol 17 g packet Commonly known as: MIRALAX / GLYCOLAX Take 17 g by mouth daily.   pregabalin 150 MG capsule Commonly known as: LYRICA Take 150 mg by mouth 2 (two) times daily.   traZODone 50 MG tablet Commonly known as: DESYREL Take 50 mg by mouth at bedtime.        Allergies: No Known Allergies  Past Medical History, Surgical history, Social history, and Family History were reviewed and updated.  Review of Systems: Review of Systems  Constitutional: Negative.   HENT: Negative.    Eyes: Negative.   Respiratory: Negative.    Cardiovascular: Negative.    Gastrointestinal: Negative.   Genitourinary: Negative.   Musculoskeletal:  Positive for back pain.  Skin: Negative.   Neurological: Negative.   Endo/Heme/Allergies: Negative.   Psychiatric/Behavioral: Negative.     [name]   Physical Exam:  height is 5\' 7"  (1.702 m) and weight is 175 lb (79.4 kg). His oral temperature is 97.4 F (36.3 C) (abnormal). His blood pressure is 109/59 (abnormal) and his pulse is 43 (abnormal). His respiration is 17 and oxygen saturation is 100%.   Wt Readings from Last 3 Encounters:  11/10/23 175 lb (79.4 kg)  09/26/23 178 lb 6.4 oz (80.9 kg)  09/24/23 179 lb 12.8 oz (81.6 kg)   Physical Exam Vitals reviewed.  HENT:     Head: Normocephalic and atraumatic.  Eyes:     Pupils: Pupils are equal, round, and reactive to light.  Cardiovascular:     Rate and Rhythm: Normal rate and regular rhythm.     Heart sounds: Normal heart sounds.  Pulmonary:     Effort: Pulmonary effort is normal.     Breath sounds: Normal breath sounds.  Abdominal:     General: Bowel sounds are normal.     Palpations: Abdomen is soft.  Musculoskeletal:        General: No tenderness or deformity. Normal range of motion.     Cervical back: Normal range of motion.  Lymphadenopathy:     Cervical: No cervical adenopathy.  Skin:    General: Skin is warm and dry.     Findings: No erythema or rash.  Neurological:     Mental Status: He is alert and oriented to person, place, and time.  Psychiatric:        Behavior: Behavior normal.        Thought Content: Thought content normal.        Judgment: Judgment normal.      Lab Results  Component Value Date   WBC 3.2 (L) 11/10/2023   HGB 11.5 (L) 11/10/2023   HCT 32.4 (L) 11/10/2023   MCV 98.5 11/10/2023   PLT 76 (L) 11/10/2023   No results found for: "FERRITIN", "IRON", "TIBC", "UIBC", "IRONPCTSAT" Lab Results  Component Value Date   RBC 3.29 (L) 11/10/2023   Lab Results  Component Value Date   KPAFRELGTCHN 44.1 (H)  09/26/2023   LAMBDASER 14.0 09/26/2023   KAPLAMBRATIO 3.15 (H) 09/26/2023   Lab Results  Component Value Date   IGGSERUM 850 09/26/2023   IGA 51 (L) 09/26/2023   IGMSERUM 36 09/26/2023   Lab Results  Component Value Date  TOTALPROTELP 6.0 09/26/2023   ALBUMINELP 4.0 09/26/2023   A1GS 0.2 09/26/2023   A2GS 0.4 09/26/2023   BETS 0.7 09/26/2023   GAMS 0.7 09/26/2023   MSPIKE 0.4 (H) 09/26/2023   SPEI Comment 05/29/2023     Chemistry      Component Value Date/Time   NA 142 11/10/2023 0951   NA 142 10/15/2019 1500   K 4.1 11/10/2023 0951   CL 105 11/10/2023 0951   CO2 30 11/10/2023 0951   BUN 22 11/10/2023 0951   BUN 25 10/15/2019 1500   CREATININE 1.05 11/10/2023 0951      Component Value Date/Time   CALCIUM 8.8 (L) 11/10/2023 0951   ALKPHOS 95 11/10/2023 0951   AST 28 11/10/2023 0951   ALT 15 11/10/2023 0951   BILITOT 0.9 11/10/2023 0951        Impression and Plan: Riley Riley is a very pleasant 83 yo caucasian gentleman with IgG Kappa myeloma. He is currently off of treatment given pancytopenia.  His white cell count still on the lower side.  His platelets still on the lower side but holding steady.  I still think we have the flexibility to keep him off treatment.  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  We still plan to get him back in 7 weeks.  I think as long as his myeloma is steady, and he is asymptomatic, we can hold off on doing any treatment.     Riley Macho, MD 3/31/202510:49 AM

## 2023-11-11 LAB — KAPPA/LAMBDA LIGHT CHAINS
Kappa free light chain: 53.6 mg/L — ABNORMAL HIGH (ref 3.3–19.4)
Kappa, lambda light chain ratio: 3.31 — ABNORMAL HIGH (ref 0.26–1.65)
Lambda free light chains: 16.2 mg/L (ref 5.7–26.3)

## 2023-11-11 LAB — IGG, IGA, IGM
IgA: 51 mg/dL — ABNORMAL LOW (ref 61–437)
IgG (Immunoglobin G), Serum: 795 mg/dL (ref 603–1613)
IgM (Immunoglobulin M), Srm: 39 mg/dL (ref 15–143)

## 2023-11-14 LAB — IMMUNOFIXATION REFLEX, SERUM
IgA: 52 mg/dL — ABNORMAL LOW (ref 61–437)
IgG (Immunoglobin G), Serum: 819 mg/dL (ref 603–1613)
IgM (Immunoglobulin M), Srm: 34 mg/dL (ref 15–143)

## 2023-11-14 LAB — PROTEIN ELECTROPHORESIS, SERUM, WITH REFLEX
A/G Ratio: 1.9 — ABNORMAL HIGH (ref 0.7–1.7)
Albumin ELP: 4.1 g/dL (ref 2.9–4.4)
Alpha-1-Globulin: 0.3 g/dL (ref 0.0–0.4)
Alpha-2-Globulin: 0.4 g/dL (ref 0.4–1.0)
Beta Globulin: 0.7 g/dL (ref 0.7–1.3)
Gamma Globulin: 0.8 g/dL (ref 0.4–1.8)
Globulin, Total: 2.2 g/dL (ref 2.2–3.9)
M-Spike, %: 0.4 g/dL — ABNORMAL HIGH
SPEP Interpretation: 0
Total Protein ELP: 6.3 g/dL (ref 6.0–8.5)

## 2023-12-22 ENCOUNTER — Inpatient Hospital Stay: Attending: Hematology & Oncology

## 2023-12-22 ENCOUNTER — Inpatient Hospital Stay: Admitting: Medical Oncology

## 2023-12-22 ENCOUNTER — Encounter: Payer: Self-pay | Admitting: Medical Oncology

## 2023-12-22 VITALS — BP 102/57 | HR 45 | Temp 97.8°F | Resp 18 | Ht 67.0 in | Wt 174.4 lb

## 2023-12-22 DIAGNOSIS — D61818 Other pancytopenia: Secondary | ICD-10-CM | POA: Insufficient documentation

## 2023-12-22 DIAGNOSIS — C9000 Multiple myeloma not having achieved remission: Secondary | ICD-10-CM

## 2023-12-22 LAB — CBC WITH DIFFERENTIAL (CANCER CENTER ONLY)
Abs Immature Granulocytes: 0.01 10*3/uL (ref 0.00–0.07)
Basophils Absolute: 0 10*3/uL (ref 0.0–0.1)
Basophils Relative: 0 %
Eosinophils Absolute: 0.1 10*3/uL (ref 0.0–0.5)
Eosinophils Relative: 2 %
HCT: 33.2 % — ABNORMAL LOW (ref 39.0–52.0)
Hemoglobin: 11.7 g/dL — ABNORMAL LOW (ref 13.0–17.0)
Immature Granulocytes: 0 %
Lymphocytes Relative: 27 %
Lymphs Abs: 0.9 10*3/uL (ref 0.7–4.0)
MCH: 34.4 pg — ABNORMAL HIGH (ref 26.0–34.0)
MCHC: 35.2 g/dL (ref 30.0–36.0)
MCV: 97.6 fL (ref 80.0–100.0)
Monocytes Absolute: 0.2 10*3/uL (ref 0.1–1.0)
Monocytes Relative: 7 %
Neutro Abs: 2.1 10*3/uL (ref 1.7–7.7)
Neutrophils Relative %: 64 %
Platelet Count: 74 10*3/uL — ABNORMAL LOW (ref 150–400)
RBC: 3.4 MIL/uL — ABNORMAL LOW (ref 4.22–5.81)
RDW: 13.2 % (ref 11.5–15.5)
WBC Count: 3.3 10*3/uL — ABNORMAL LOW (ref 4.0–10.5)
nRBC: 0 % (ref 0.0–0.2)

## 2023-12-22 LAB — CMP (CANCER CENTER ONLY)
ALT: 12 U/L (ref 0–44)
AST: 25 U/L (ref 15–41)
Albumin: 4.5 g/dL (ref 3.5–5.0)
Alkaline Phosphatase: 98 U/L (ref 38–126)
Anion gap: 6 (ref 5–15)
BUN: 21 mg/dL (ref 8–23)
CO2: 30 mmol/L (ref 22–32)
Calcium: 9.1 mg/dL (ref 8.9–10.3)
Chloride: 105 mmol/L (ref 98–111)
Creatinine: 1.09 mg/dL (ref 0.61–1.24)
GFR, Estimated: >60 mL/min (ref >60)
Glucose, Bld: 110 mg/dL — ABNORMAL HIGH (ref 70–99)
Potassium: 4.2 mmol/L (ref 3.5–5.1)
Sodium: 141 mmol/L (ref 135–145)
Total Bilirubin: 0.8 mg/dL (ref 0.0–1.2)
Total Protein: 6.3 g/dL — ABNORMAL LOW (ref 6.5–8.1)

## 2023-12-22 LAB — LACTATE DEHYDROGENASE: LDH: 349 U/L — ABNORMAL HIGH (ref 98–192)

## 2023-12-22 NOTE — Progress Notes (Signed)
 Hematology and Oncology Follow Up Visit  Riley Eaton [id number] 07-Mar-1941 83 y.o. 12/22/2023   Principle Diagnosis:  IgG Kappa myeloma  -- +1q and t(11:14)   Current Therapy:        Velcade /Decadron  -- (3 wk on/1 wk off) -- s/p cycle #12 -- started on 08/24/2021-DC on 03/06/2023  Riley Eaton - s/p cycle 6 -  started on 04/30/2022-DC on 03/06/2023     Interim History:  Riley Eaton is here today for follow-up.  He has now been off treatment for 10 months.     Today he states that he is doing well. He has no concerns today.   When we last saw him in March, 2025, his monoclonal spike was stable at 0.4 g/dL.  His IgG level was stable at 795 mg/dL. His kappa light chain was 5.36 mg/dL.  Everything is holding nice and stable and low.    His appetite has been good.  He has had no nausea or vomiting.  He has had no change in bowel or bladder habits.  He has had no rashes.  There has been no bleeding.No night sweats.   He has had no issues with COVID.  He has had no cough or shortness of breath.  Overall, I would say his performance status is probably ECOG 2.    He has lost over 20 pounds over the past few months. He reports that this is purposeful through reducing his snack intake and focusing on meals instead.   Wt Readings from Last 3 Encounters:  12/22/23 174 lb 6.4 oz (79.1 kg)  11/10/23 175 lb (79.4 kg)  09/26/23 178 lb 6.4 oz (80.9 kg)   Medications:  Allergies as of 12/22/2023   No Known Allergies      Medication List        Accurate as of Dec 22, 2023 11:07 AM. If you have any questions, ask your nurse or doctor.          acetaminophen  325 MG tablet Commonly known as: TYLENOL  Take 2 tablets (650 mg total) by mouth every 6 (six) hours as needed for mild pain (or temp > 100).   allopurinol  100 MG tablet Commonly known as: ZYLOPRIM  TAKE 1 TABLET BY MOUTH EVERY DAY   aspirin  81 MG chewable tablet Chew 1 tablet (81 mg total) by mouth daily.   atorvastatin  40 MG  tablet Commonly known as: LIPITOR Take 1 tablet (40 mg total) by mouth daily at 6 PM. What changed: when to take this   carvedilol  6.25 MG tablet Commonly known as: COREG  Take 1 tablet (6.25 mg total) by mouth 2 (two) times daily.   colchicine 0.6 MG tablet   cyanocobalamin 500 MCG tablet Commonly known as: VITAMIN B12 Take 500 mcg by mouth daily.   diphenhydramine -acetaminophen  25-500 MG Tabs tablet Commonly known as: TYLENOL  PM Take 1 tablet by mouth at bedtime.   finasteride  5 MG tablet Commonly known as: PROSCAR  Take 1 tablet (5 mg total) by mouth daily. For urination   furosemide  40 MG tablet Commonly known as: LASIX  TAKE 1 TABLET BY MOUTH EVERY DAY   HYDROcodone -acetaminophen  7.5-325 MG tablet Commonly known as: NORCO Take 1 tablet by mouth 4 (four) times daily as needed for moderate pain.   LORazepam  0.5 MG tablet Commonly known as: ATIVAN  TAKE 1 TABLET (0.5 MG TOTAL) BY MOUTH EVERY 6 (SIX) HOURS AS NEEDED (NAUSEA OR VOMITING).   losartan  25 MG tablet Commonly known as: COZAAR  Take 25 mg by mouth daily.  Movantik  25 MG Tabs tablet Generic drug: naloxegol  oxalate Take 25 mg by mouth daily.   ondansetron  4 MG disintegrating tablet Commonly known as: Zofran  ODT Take 1 tablet (4 mg total) by mouth every 8 (eight) hours as needed for nausea or vomiting.   pantoprazole  40 MG tablet Commonly known as: PROTONIX  Take 1 tablet (40 mg total) by mouth at bedtime. What changed: when to take this   polyethylene glycol 17 g packet Commonly known as: MIRALAX  / GLYCOLAX  Take 17 g by mouth daily.   pregabalin  150 MG capsule Commonly known as: LYRICA  Take 150 mg by mouth 2 (two) times daily.   traZODone  50 MG tablet Commonly known as: DESYREL  Take 50 mg by mouth at bedtime.        Allergies: No Known Allergies  Past Medical History, Surgical history, Social history, and Family History were reviewed and updated.  Review of Systems: Review of Systems   Constitutional: Negative.   HENT: Negative.    Eyes: Negative.   Respiratory: Negative.    Cardiovascular: Negative.   Gastrointestinal: Negative.   Genitourinary: Negative.   Musculoskeletal:  Positive for back pain.  Skin: Negative.   Neurological: Negative.   Endo/Heme/Allergies: Negative.   Psychiatric/Behavioral: Negative.     Riley Eaton   Physical Exam:  height is 5\' 7"  (1.702 m) and weight is 174 lb 6.4 oz (79.1 kg). His oral temperature is 97.8 F (36.6 C). His blood pressure is 102/57 (abnormal) and his pulse is 45 (abnormal). His respiration is 18 and oxygen  saturation is 100%.   Wt Readings from Last 3 Encounters:  12/22/23 174 lb 6.4 oz (79.1 kg)  11/10/23 175 lb (79.4 kg)  09/26/23 178 lb 6.4 oz (80.9 kg)   Physical Exam Vitals reviewed.  HENT:     Head: Normocephalic and atraumatic.  Eyes:     Pupils: Pupils are equal, round, and reactive to light.  Cardiovascular:     Rate and Rhythm: Normal rate and regular rhythm.     Heart sounds: Normal heart sounds.  Pulmonary:     Effort: Pulmonary effort is normal.     Breath sounds: Normal breath sounds.  Abdominal:     General: Bowel sounds are normal.     Palpations: Abdomen is soft.  Musculoskeletal:        General: No tenderness or deformity. Normal range of motion.     Cervical back: Normal range of motion.  Lymphadenopathy:     Cervical: No cervical adenopathy.  Skin:    General: Skin is warm and dry.     Findings: No erythema or rash.  Neurological:     Mental Status: He is alert and oriented to person, place, and time.  Psychiatric:        Behavior: Behavior normal.        Thought Content: Thought content normal.        Judgment: Judgment normal.      Lab Results  Component Value Date   WBC 3.3 (L) 12/22/2023   HGB 11.7 (L) 12/22/2023   HCT 33.2 (L) 12/22/2023   MCV 97.6 12/22/2023   PLT 74 (L) 12/22/2023   No results found for: "FERRITIN", "IRON", "TIBC", "UIBC", "IRONPCTSAT" Lab Results   Component Value Date   RBC 3.40 (L) 12/22/2023   Lab Results  Component Value Date   KPAFRELGTCHN 53.6 (H) 11/10/2023   LAMBDASER 16.2 11/10/2023   KAPLAMBRATIO 3.31 (H) 11/10/2023   Lab Results  Component Value Date   IGGSERUM 795  11/10/2023   IGGSERUM 819 11/10/2023   IGA 51 (L) 11/10/2023   IGA 52 (L) 11/10/2023   IGMSERUM 39 11/10/2023   IGMSERUM 34 11/10/2023   Lab Results  Component Value Date   TOTALPROTELP 6.3 11/10/2023   ALBUMINELP 4.1 11/10/2023   A1GS 0.3 11/10/2023   A2GS 0.4 11/10/2023   BETS 0.7 11/10/2023   GAMS 0.8 11/10/2023   MSPIKE 0.4 (H) 11/10/2023   SPEI Comment 05/29/2023     Chemistry      Component Value Date/Time   NA 142 11/10/2023 0951   NA 142 10/15/2019 1500   K 4.1 11/10/2023 0951   CL 105 11/10/2023 0951   CO2 30 11/10/2023 0951   BUN 22 11/10/2023 0951   BUN 25 10/15/2019 1500   CREATININE 1.05 11/10/2023 0951      Component Value Date/Time   CALCIUM  8.8 (L) 11/10/2023 0951   ALKPHOS 95 11/10/2023 0951   AST 28 11/10/2023 0951   ALT 15 11/10/2023 0951   BILITOT 0.9 11/10/2023 0951      Encounter Diagnosis  Name Primary?   IgG myeloma (HCC) Yes    Impression and Plan: Mr. Olund is a very pleasant 83 yo caucasian gentleman with IgG Kappa myeloma. He is currently off of treatment given pancytopenia.  His white cell count still on the lower side.  His platelets still on the lower side but holding steady.  CBC is stable. Monoclonal labs pending He is stopping his losartan  given weight loss and lower BP levels as directed by PCP. He is taking his last pill today.   RTC 2 months MD, labs (CBC w/, CMP, LDH, SPEP, light chains)   Sharla Davis, PA-C 5/12/202511:07 AM

## 2023-12-23 LAB — KAPPA/LAMBDA LIGHT CHAINS
Kappa free light chain: 51.3 mg/L — ABNORMAL HIGH (ref 3.3–19.4)
Kappa, lambda light chain ratio: 2.85 — ABNORMAL HIGH (ref 0.26–1.65)
Lambda free light chains: 18 mg/L (ref 5.7–26.3)

## 2023-12-23 LAB — IGG, IGA, IGM
IgA: 56 mg/dL — ABNORMAL LOW (ref 61–437)
IgG (Immunoglobin G), Serum: 889 mg/dL (ref 603–1613)
IgM (Immunoglobulin M), Srm: 39 mg/dL (ref 15–143)

## 2023-12-31 LAB — PROTEIN ELECTROPHORESIS, SERUM, WITH REFLEX
A/G Ratio: 1.8 — ABNORMAL HIGH (ref 0.7–1.7)
Albumin ELP: 3.8 g/dL (ref 2.9–4.4)
Alpha-1-Globulin: 0.2 g/dL (ref 0.0–0.4)
Alpha-2-Globulin: 0.4 g/dL (ref 0.4–1.0)
Beta Globulin: 0.7 g/dL (ref 0.7–1.3)
Gamma Globulin: 0.8 g/dL (ref 0.4–1.8)
Globulin, Total: 2.1 g/dL — ABNORMAL LOW (ref 2.2–3.9)
M-Spike, %: 0.4 g/dL — ABNORMAL HIGH
SPEP Interpretation: 0
Total Protein ELP: 5.9 g/dL — ABNORMAL LOW (ref 6.0–8.5)

## 2023-12-31 LAB — IMMUNOFIXATION REFLEX, SERUM
IgA: 54 mg/dL — ABNORMAL LOW (ref 61–437)
IgG (Immunoglobin G), Serum: 833 mg/dL (ref 603–1613)
IgM (Immunoglobulin M), Srm: 37 mg/dL (ref 15–143)

## 2024-01-13 NOTE — Addendum Note (Signed)
 Addended by: Carie Charity on: 01/13/2024 10:11 AM   Modules accepted: Orders

## 2024-02-23 ENCOUNTER — Encounter: Payer: Self-pay | Admitting: Medical Oncology

## 2024-02-23 ENCOUNTER — Inpatient Hospital Stay: Attending: Hematology & Oncology

## 2024-02-23 ENCOUNTER — Ambulatory Visit: Payer: Self-pay | Admitting: Medical Oncology

## 2024-02-23 ENCOUNTER — Inpatient Hospital Stay: Admitting: Medical Oncology

## 2024-02-23 VITALS — BP 93/63 | HR 46 | Temp 97.5°F | Resp 20 | Ht 67.0 in | Wt 177.0 lb

## 2024-02-23 DIAGNOSIS — E43 Unspecified severe protein-calorie malnutrition: Secondary | ICD-10-CM | POA: Diagnosis not present

## 2024-02-23 DIAGNOSIS — D61818 Other pancytopenia: Secondary | ICD-10-CM | POA: Insufficient documentation

## 2024-02-23 DIAGNOSIS — C9000 Multiple myeloma not having achieved remission: Secondary | ICD-10-CM | POA: Insufficient documentation

## 2024-02-23 DIAGNOSIS — D696 Thrombocytopenia, unspecified: Secondary | ICD-10-CM | POA: Insufficient documentation

## 2024-02-23 LAB — CBC WITH DIFFERENTIAL (CANCER CENTER ONLY)
Abs Immature Granulocytes: 0.02 K/uL (ref 0.00–0.07)
Basophils Absolute: 0 K/uL (ref 0.0–0.1)
Basophils Relative: 0 %
Eosinophils Absolute: 0.1 K/uL (ref 0.0–0.5)
Eosinophils Relative: 2 %
HCT: 34.4 % — ABNORMAL LOW (ref 39.0–52.0)
Hemoglobin: 11.9 g/dL — ABNORMAL LOW (ref 13.0–17.0)
Immature Granulocytes: 1 %
Lymphocytes Relative: 36 %
Lymphs Abs: 1.2 K/uL (ref 0.7–4.0)
MCH: 34.5 pg — ABNORMAL HIGH (ref 26.0–34.0)
MCHC: 34.6 g/dL (ref 30.0–36.0)
MCV: 99.7 fL (ref 80.0–100.0)
Monocytes Absolute: 0.2 K/uL (ref 0.1–1.0)
Monocytes Relative: 7 %
Neutro Abs: 1.9 K/uL (ref 1.7–7.7)
Neutrophils Relative %: 54 %
Platelet Count: 72 K/uL — ABNORMAL LOW (ref 150–400)
RBC: 3.45 MIL/uL — ABNORMAL LOW (ref 4.22–5.81)
RDW: 13.5 % (ref 11.5–15.5)
WBC Count: 3.4 K/uL — ABNORMAL LOW (ref 4.0–10.5)
nRBC: 0 % (ref 0.0–0.2)

## 2024-02-23 LAB — CMP (CANCER CENTER ONLY)
ALT: 11 U/L (ref 0–44)
AST: 24 U/L (ref 15–41)
Albumin: 4.3 g/dL (ref 3.5–5.0)
Alkaline Phosphatase: 82 U/L (ref 38–126)
Anion gap: 5 (ref 5–15)
BUN: 23 mg/dL (ref 8–23)
CO2: 31 mmol/L (ref 22–32)
Calcium: 9.4 mg/dL (ref 8.9–10.3)
Chloride: 104 mmol/L (ref 98–111)
Creatinine: 1.15 mg/dL (ref 0.61–1.24)
GFR, Estimated: >60 mL/min (ref >60)
Glucose, Bld: 127 mg/dL — ABNORMAL HIGH (ref 70–99)
Potassium: 4.9 mmol/L (ref 3.5–5.1)
Sodium: 140 mmol/L (ref 135–145)
Total Bilirubin: 1 mg/dL (ref 0.0–1.2)
Total Protein: 6.3 g/dL — ABNORMAL LOW (ref 6.5–8.1)

## 2024-02-23 LAB — LACTATE DEHYDROGENASE: LDH: 310 U/L — ABNORMAL HIGH (ref 98–192)

## 2024-02-23 NOTE — Addendum Note (Signed)
 Addended by: TONETTE DOMINO on: 02/23/2024 10:52 AM   Modules accepted: Orders

## 2024-02-23 NOTE — Progress Notes (Signed)
 Hematology and Oncology Follow Up Visit  TREY GULBRANSON [id number] 1940-10-06 83 y.o. 02/23/2024   Principle Diagnosis:  IgG Kappa myeloma  -- +1q and t(11:14)   Current Therapy:        Velcade /Decadron  -- (3 wk on/1 wk off) -- s/p cycle #12 -- started on 08/24/2021-DC on 03/06/2023  [name] - s/p cycle 6 -  started on 04/30/2022-DC on 03/06/2023     Interim History:  Mr. Arya is here today for follow-up.  He has now been off treatment for 11 months.    Today he states that he is doing well. He has no concerns today.   When we last saw him in May, 2025, his monoclonal spike was stable at 0.4 g/dL.  His IgG level was stable at 889 mg/dL. His kappa light chain was 5.13 mg/dL.   His appetite has been good.  He has had no nausea or vomiting.  He has had no change in bowel or bladder habits.  He has had no rashes.  There has been no bleeding.No night sweats.   He has had no cough or shortness of breath.  Overall, I would say his performance status is probably ECOG 2.    He has lost over 20 pounds over the past few months. He reports that this is purposeful through reducing his snack intake and focusing on meals instead.   Wt Readings from Last 3 Encounters:  02/23/24 177 lb (80.3 kg)  12/22/23 174 lb 6.4 oz (79.1 kg)  11/10/23 175 lb (79.4 kg)   Medications:  Allergies as of 02/23/2024   No Known Allergies      Medication List        Accurate as of February 23, 2024 10:36 AM. If you have any questions, ask your nurse or doctor.          acetaminophen  325 MG tablet Commonly known as: TYLENOL  Take 2 tablets (650 mg total) by mouth every 6 (six) hours as needed for mild pain (or temp > 100).   allopurinol  100 MG tablet Commonly known as: ZYLOPRIM  TAKE 1 TABLET BY MOUTH EVERY DAY   aspirin  81 MG chewable tablet Chew 1 tablet (81 mg total) by mouth daily.   atorvastatin  40 MG tablet Commonly known as: LIPITOR Take 1 tablet (40 mg total) by mouth daily at 6 PM.    carvedilol  6.25 MG tablet Commonly known as: COREG  Take 1 tablet (6.25 mg total) by mouth 2 (two) times daily.   colchicine 0.6 MG tablet   cyanocobalamin 500 MCG tablet Commonly known as: VITAMIN B12 Take 500 mcg by mouth daily.   diphenhydramine -acetaminophen  25-500 MG Tabs tablet Commonly known as: TYLENOL  PM Take 1 tablet by mouth at bedtime.   finasteride  5 MG tablet Commonly known as: PROSCAR  Take 1 tablet (5 mg total) by mouth daily. For urination   furosemide  40 MG tablet Commonly known as: LASIX  TAKE 1 TABLET BY MOUTH EVERY DAY   HYDROcodone -acetaminophen  7.5-325 MG tablet Commonly known as: NORCO Take 1 tablet by mouth 4 (four) times daily as needed for moderate pain.   LORazepam  0.5 MG tablet Commonly known as: ATIVAN  TAKE 1 TABLET (0.5 MG TOTAL) BY MOUTH EVERY 6 (SIX) HOURS AS NEEDED (NAUSEA OR VOMITING).   losartan  25 MG tablet Commonly known as: COZAAR  Take 25 mg by mouth daily.   Movantik  25 MG Tabs tablet Generic drug: naloxegol  oxalate Take 25 mg by mouth daily.   ondansetron  4 MG disintegrating tablet Commonly known as: Zofran   ODT Take 1 tablet (4 mg total) by mouth every 8 (eight) hours as needed for nausea or vomiting.   pantoprazole  40 MG tablet Commonly known as: PROTONIX  Take 1 tablet (40 mg total) by mouth at bedtime.   polyethylene glycol 17 g packet Commonly known as: MIRALAX  / GLYCOLAX  Take 17 g by mouth daily.   pregabalin  150 MG capsule Commonly known as: LYRICA  Take 150 mg by mouth 2 (two) times daily.   traZODone  50 MG tablet Commonly known as: DESYREL  Take 50 mg by mouth at bedtime.        Allergies: No Known Allergies  Past Medical History, Surgical history, Social history, and Family History were reviewed and updated.  Review of Systems: Review of Systems  Constitutional: Negative.   HENT: Negative.    Eyes: Negative.   Respiratory: Negative.    Cardiovascular: Negative.   Gastrointestinal: Negative.    Genitourinary: Negative.   Musculoskeletal:  Positive for back pain.  Skin: Negative.   Neurological: Negative.   Endo/Heme/Allergies: Negative.   Psychiatric/Behavioral: Negative.     Physical Exam:  height is 5' 7 (1.702 m) and weight is 177 lb (80.3 kg). His oral temperature is 97.5 F (36.4 C) (abnormal). His blood pressure is 93/63 and his pulse is 46 (abnormal). His respiration is 20 and oxygen  saturation is 95%.   Wt Readings from Last 3 Encounters:  02/23/24 177 lb (80.3 kg)  12/22/23 174 lb 6.4 oz (79.1 kg)  11/10/23 175 lb (79.4 kg)   Physical Exam Vitals reviewed.  HENT:     Head: Normocephalic and atraumatic.  Eyes:     Pupils: Pupils are equal, round, and reactive to light.  Cardiovascular:     Rate and Rhythm: Normal rate and regular rhythm.     Heart sounds: Normal heart sounds.  Pulmonary:     Effort: Pulmonary effort is normal.     Breath sounds: Normal breath sounds.  Abdominal:     General: Bowel sounds are normal.     Palpations: Abdomen is soft.  Musculoskeletal:        General: No tenderness or deformity. Normal range of motion.     Cervical back: Normal range of motion.  Lymphadenopathy:     Cervical: No cervical adenopathy.  Skin:    General: Skin is warm and dry.     Findings: No erythema or rash.  Neurological:     Mental Status: He is alert and oriented to person, place, and time.  Psychiatric:        Behavior: Behavior normal.        Thought Content: Thought content normal.        Judgment: Judgment normal.      Lab Results  Component Value Date   WBC 3.4 (L) 02/23/2024   HGB 11.9 (L) 02/23/2024   HCT 34.4 (L) 02/23/2024   MCV 99.7 02/23/2024   PLT 72 (L) 02/23/2024   No results found for: FERRITIN, IRON, TIBC, UIBC, IRONPCTSAT Lab Results  Component Value Date   RBC 3.45 (L) 02/23/2024   Lab Results  Component Value Date   KPAFRELGTCHN 51.3 (H) 12/22/2023   LAMBDASER 18.0 12/22/2023   KAPLAMBRATIO 2.85 (H)  12/22/2023   Lab Results  Component Value Date   IGGSERUM 889 12/22/2023   IGGSERUM 833 12/22/2023   IGA 56 (L) 12/22/2023   IGA 54 (L) 12/22/2023   IGMSERUM 39 12/22/2023   IGMSERUM 37 12/22/2023   Lab Results  Component Value Date   TOTALPROTELP  5.9 (L) 12/22/2023   ALBUMINELP 3.8 12/22/2023   A1GS 0.2 12/22/2023   A2GS 0.4 12/22/2023   BETS 0.7 12/22/2023   GAMS 0.8 12/22/2023   MSPIKE 0.4 (H) 12/22/2023   SPEI Comment 05/29/2023     Chemistry      Component Value Date/Time   NA 141 12/22/2023 1020   NA 142 10/15/2019 1500   K 4.2 12/22/2023 1020   CL 105 12/22/2023 1020   CO2 30 12/22/2023 1020   BUN 21 12/22/2023 1020   BUN 25 10/15/2019 1500   CREATININE 1.09 12/22/2023 1020      Component Value Date/Time   CALCIUM  9.1 12/22/2023 1020   ALKPHOS 98 12/22/2023 1020   AST 25 12/22/2023 1020   ALT 12 12/22/2023 1020   BILITOT 0.8 12/22/2023 1020      Encounter Diagnoses  Name Primary?   IgG myeloma (HCC) Yes   Protein-calorie malnutrition, severe (HCC)      Impression and Plan: Mr. Maybee is a very pleasant 83 yo caucasian gentleman with IgG Kappa myeloma. He is currently off of treatment given pancytopenia.  His white cell count still on the lower side.  His platelets still on the lower side but holding steady.  CBC is stable. CMP pending Thrombocytopenia slowly trending down without bleeding.  Monoclonal labs pending  RTC 2 months MD, labs (CBC w/, CMP, LDH, SPEP, light chains)   Lauraine CHRISTELLA Dais, PA-C 7/14/202510:36 AM

## 2024-02-24 LAB — KAPPA/LAMBDA LIGHT CHAINS
Kappa free light chain: 58.9 mg/L — ABNORMAL HIGH (ref 3.3–19.4)
Kappa, lambda light chain ratio: 3.37 — ABNORMAL HIGH (ref 0.26–1.65)
Lambda free light chains: 17.5 mg/L (ref 5.7–26.3)

## 2024-02-25 LAB — MULTIPLE MYELOMA PANEL, SERUM
Albumin SerPl Elph-Mcnc: 4 g/dL (ref 2.9–4.4)
Albumin/Glob SerPl: 1.9 — ABNORMAL HIGH (ref 0.7–1.7)
Alpha 1: 0.2 g/dL (ref 0.0–0.4)
Alpha2 Glob SerPl Elph-Mcnc: 0.4 g/dL (ref 0.4–1.0)
B-Globulin SerPl Elph-Mcnc: 0.6 g/dL — ABNORMAL LOW (ref 0.7–1.3)
Gamma Glob SerPl Elph-Mcnc: 1 g/dL (ref 0.4–1.8)
Globulin, Total: 2.2 g/dL (ref 2.2–3.9)
IgA: 64 mg/dL (ref 61–437)
IgG (Immunoglobin G), Serum: 967 mg/dL (ref 603–1613)
IgM (Immunoglobulin M), Srm: 42 mg/dL (ref 15–143)
M Protein SerPl Elph-Mcnc: 0.5 g/dL — ABNORMAL HIGH
Total Protein ELP: 6.2 g/dL (ref 6.0–8.5)

## 2024-02-27 ENCOUNTER — Ambulatory Visit (HOSPITAL_COMMUNITY)
Admission: RE | Admit: 2024-02-27 | Discharge: 2024-02-27 | Disposition: A | Discharge status: Home / Self Care | Source: Ambulatory Visit | Attending: Cardiology | Admitting: Cardiology

## 2024-02-27 ENCOUNTER — Ambulatory Visit (HOSPITAL_BASED_OUTPATIENT_CLINIC_OR_DEPARTMENT_OTHER): Payer: Self-pay | Admitting: Family

## 2024-02-27 DIAGNOSIS — I7121 Aneurysm of the ascending aorta, without rupture: Secondary | ICD-10-CM | POA: Diagnosis present

## 2024-02-27 LAB — ECHOCARDIOGRAM COMPLETE
Area-P 1/2: 2.53 cm2
P 1/2 time: 644 ms
S' Lateral: 3.1 cm

## 2024-02-27 NOTE — Telephone Encounter (Signed)
-----   Message from Lauraine CHRISTELLA Dais sent at 02/26/2024  4:32 PM EDT ----- Scant increase in M protein level but nothing of significant concern. We will just keep monitoring  ----- Message ----- From: Interface, [facility] [place] Sent: 02/23/2024  10:36 AM EDT To: Lauraine CHRISTELLA Dais, PA-C

## 2024-02-27 NOTE — Telephone Encounter (Signed)
 Call placed to patient and message left to notify pt per order of S. Gary PA that there is a Scant increase in M protein level but nothing of significant concern. We will just keep monitoring.  Instructed pt to call office back with any questions or concerns.

## 2024-03-12 NOTE — Telephone Encounter (Signed)
 Letter of results sent to pt  Order placed

## 2024-04-26 ENCOUNTER — Encounter: Payer: Self-pay | Admitting: Hematology & Oncology

## 2024-04-26 ENCOUNTER — Other Ambulatory Visit: Payer: Self-pay

## 2024-04-26 ENCOUNTER — Inpatient Hospital Stay: Attending: Hematology & Oncology

## 2024-04-26 ENCOUNTER — Inpatient Hospital Stay: Admitting: Hematology & Oncology

## 2024-04-26 VITALS — BP 115/62 | HR 47 | Temp 97.5°F | Resp 18 | Ht 67.0 in | Wt 171.0 lb

## 2024-04-26 DIAGNOSIS — D472 Monoclonal gammopathy: Secondary | ICD-10-CM | POA: Diagnosis not present

## 2024-04-26 DIAGNOSIS — C9000 Multiple myeloma not having achieved remission: Secondary | ICD-10-CM | POA: Insufficient documentation

## 2024-04-26 LAB — CMP (CANCER CENTER ONLY)
ALT: 16 U/L (ref 0–44)
AST: 38 U/L (ref 15–41)
Albumin: 4.4 g/dL (ref 3.5–5.0)
Alkaline Phosphatase: 88 U/L (ref 38–126)
Anion gap: 10 (ref 5–15)
BUN: 20 mg/dL (ref 8–23)
CO2: 27 mmol/L (ref 22–32)
Calcium: 9.3 mg/dL (ref 8.9–10.3)
Chloride: 104 mmol/L (ref 98–111)
Creatinine: 1.09 mg/dL (ref 0.61–1.24)
GFR, Estimated: >60 mL/min (ref >60)
Glucose, Bld: 107 mg/dL — ABNORMAL HIGH (ref 70–99)
Potassium: 4.3 mmol/L (ref 3.5–5.1)
Sodium: 141 mmol/L (ref 135–145)
Total Bilirubin: 0.8 mg/dL (ref 0.0–1.2)
Total Protein: 6.5 g/dL (ref 6.5–8.1)

## 2024-04-26 LAB — CBC WITH DIFFERENTIAL (CANCER CENTER ONLY)
Abs Immature Granulocytes: 0.02 K/uL (ref 0.00–0.07)
Basophils Absolute: 0 K/uL (ref 0.0–0.1)
Basophils Relative: 0 %
Eosinophils Absolute: 0.1 K/uL (ref 0.0–0.5)
Eosinophils Relative: 2 %
HCT: 32.4 % — ABNORMAL LOW (ref 39.0–52.0)
Hemoglobin: 11.5 g/dL — ABNORMAL LOW (ref 13.0–17.0)
Immature Granulocytes: 1 %
Lymphocytes Relative: 34 %
Lymphs Abs: 1.2 K/uL (ref 0.7–4.0)
MCH: 34.7 pg — ABNORMAL HIGH (ref 26.0–34.0)
MCHC: 35.5 g/dL (ref 30.0–36.0)
MCV: 97.9 fL (ref 80.0–100.0)
Monocytes Absolute: 0.2 K/uL (ref 0.1–1.0)
Monocytes Relative: 7 %
Neutro Abs: 2 K/uL (ref 1.7–7.7)
Neutrophils Relative %: 56 %
Platelet Count: 72 K/uL — ABNORMAL LOW (ref 150–400)
RBC: 3.31 MIL/uL — ABNORMAL LOW (ref 4.22–5.81)
RDW: 13.3 % (ref 11.5–15.5)
WBC Count: 3.5 K/uL — ABNORMAL LOW (ref 4.0–10.5)
nRBC: 0 % (ref 0.0–0.2)

## 2024-04-26 LAB — LACTATE DEHYDROGENASE: LDH: 456 U/L — ABNORMAL HIGH (ref 98–192)

## 2024-04-26 NOTE — Progress Notes (Signed)
 Hematology and Oncology Follow Up Visit  Riley Eaton [id number] Jun 20, 1941 83 y.o. 04/26/2024   Principle Diagnosis:  IgG Kappa myeloma  -- +1q and t(11:14)   Current Therapy:        Velcade /Decadron  -- (3 wk on/1 wk off) -- s/p cycle #12 -- started on 08/24/2021-DC on 03/06/2023  [name] - s/p cycle 6 -  started on 04/30/2022-DC on 03/06/2023     Interim History:  Riley Eaton is here today for follow-up.  I am very amazed as to how well he is doing.  He has now been off treatment now for about 14 months.  I am very impressed as he well as he is doing.  More when I saw him, his myeloma studies showed a monoclonal spike of 0.5 g/dL.  His IgG level was 967 mg/dL.  His kappa light chain was 5.9 mg/dL.  He has had no problems with his appetite.  He has had no problems with nausea or vomiting.  He has had no cough.  Send no bleeding.  He has had no change in bowel or bladder habits.  There has been no leg swelling.  He has had no rashes.  Overall, I would have to say that his performance status is probably ECOG 1.   Wt Readings from Last 3 Encounters:  04/26/24 171 lb (77.6 kg)  02/23/24 177 lb (80.3 kg)  12/22/23 174 lb 6.4 oz (79.1 kg)   Medications:  Allergies as of 04/26/2024   No Known Allergies      Medication List        Accurate as of April 26, 2024 11:25 AM. If you have any questions, ask your nurse or doctor.          STOP taking these medications    aspirin  81 MG chewable tablet Stopped by: Quame Spratlin R Emira Eubanks   Movantik  25 MG Tabs tablet Generic drug: naloxegol  oxalate Stopped by: Naven Giambalvo R Geral Tuch   polyethylene glycol 17 g packet Commonly known as: MIRALAX  / GLYCOLAX  Stopped by: Maude JONELLE Crease       TAKE these medications    acetaminophen  325 MG tablet Commonly known as: TYLENOL  Take 2 tablets (650 mg total) by mouth every 6 (six) hours as needed for mild pain (or temp > 100).   allopurinol  100 MG tablet Commonly known as: ZYLOPRIM  TAKE 1  TABLET BY MOUTH EVERY DAY   atorvastatin  40 MG tablet Commonly known as: LIPITOR Take 1 tablet (40 mg total) by mouth daily at 6 PM.   carvedilol  6.25 MG tablet Commonly known as: COREG  Take 1 tablet (6.25 mg total) by mouth 2 (two) times daily.   colchicine 0.6 MG tablet   cyanocobalamin 500 MCG tablet Commonly known as: VITAMIN B12 Take 500 mcg by mouth daily.   diphenhydramine -acetaminophen  25-500 MG Tabs tablet Commonly known as: TYLENOL  PM Take 1 tablet by mouth at bedtime.   Enulose 10 GM/15ML Soln Generic drug: lactulose (encephalopathy) 10 g daily as needed.   finasteride  5 MG tablet Commonly known as: PROSCAR  Take 1 tablet (5 mg total) by mouth daily. For urination   furosemide  40 MG tablet Commonly known as: LASIX  TAKE 1 TABLET BY MOUTH EVERY DAY   HYDROcodone -acetaminophen  7.5-325 MG tablet Commonly known as: NORCO Take 1 tablet by mouth 4 (four) times daily as needed for moderate pain.   lactulose 10 GM/15ML solution Commonly known as: CHRONULAC Take 10 g by mouth daily as needed.   LORazepam  0.5 MG tablet Commonly known as:  ATIVAN  TAKE 1 TABLET (0.5 MG TOTAL) BY MOUTH EVERY 6 (SIX) HOURS AS NEEDED (NAUSEA OR VOMITING).   ondansetron  4 MG disintegrating tablet Commonly known as: Zofran  ODT Take 1 tablet (4 mg total) by mouth every 8 (eight) hours as needed for nausea or vomiting.   pantoprazole  40 MG tablet Commonly known as: PROTONIX  Take 1 tablet (40 mg total) by mouth at bedtime.   pregabalin  150 MG capsule Commonly known as: LYRICA  Take 150 mg by mouth 2 (two) times daily.   traZODone  50 MG tablet Commonly known as: DESYREL  Take 50 mg by mouth at bedtime.        Allergies: No Known Allergies  Past Medical History, Surgical history, Social history, and Family History were reviewed and updated.  Review of Systems: Review of Systems  Constitutional: Negative.   HENT: Negative.    Eyes: Negative.   Respiratory: Negative.     Cardiovascular: Negative.   Gastrointestinal: Negative.   Genitourinary: Negative.   Musculoskeletal:  Positive for back pain.  Skin: Negative.   Neurological: Negative.   Endo/Heme/Allergies: Negative.   Psychiatric/Behavioral: Negative.     Physical Exam:  height is 5' 7 (1.702 m) and weight is 171 lb (77.6 kg). His oral temperature is 97.5 F (36.4 C) (abnormal). His blood pressure is 115/62 and his pulse is 47 (abnormal). His respiration is 18 and oxygen  saturation is 98%.   Wt Readings from Last 3 Encounters:  04/26/24 171 lb (77.6 kg)  02/23/24 177 lb (80.3 kg)  12/22/23 174 lb 6.4 oz (79.1 kg)   Physical Exam Vitals reviewed.  HENT:     Head: Normocephalic and atraumatic.  Eyes:     Pupils: Pupils are equal, round, and reactive to light.  Cardiovascular:     Rate and Rhythm: Normal rate and regular rhythm.     Heart sounds: Normal heart sounds.  Pulmonary:     Effort: Pulmonary effort is normal.     Breath sounds: Normal breath sounds.  Abdominal:     General: Bowel sounds are normal.     Palpations: Abdomen is soft.  Musculoskeletal:        General: No tenderness or deformity. Normal range of motion.     Cervical back: Normal range of motion.  Lymphadenopathy:     Cervical: No cervical adenopathy.  Skin:    General: Skin is warm and dry.     Findings: No erythema or rash.  Neurological:     Mental Status: He is alert and oriented to person, place, and time.  Psychiatric:        Behavior: Behavior normal.        Thought Content: Thought content normal.        Judgment: Judgment normal.      Lab Results  Component Value Date   WBC 3.5 (L) 04/26/2024   HGB 11.5 (L) 04/26/2024   HCT 32.4 (L) 04/26/2024   MCV 97.9 04/26/2024   PLT 72 (L) 04/26/2024   No results found for: FERRITIN, IRON, TIBC, UIBC, IRONPCTSAT Lab Results  Component Value Date   RBC 3.31 (L) 04/26/2024   Lab Results  Component Value Date   KPAFRELGTCHN 58.9 (H)  02/23/2024   LAMBDASER 17.5 02/23/2024   KAPLAMBRATIO 3.37 (H) 02/23/2024   Lab Results  Component Value Date   IGGSERUM 967 02/23/2024   IGA 64 02/23/2024   IGMSERUM 42 02/23/2024   Lab Results  Component Value Date   TOTALPROTELP 6.2 02/23/2024   ALBUMINELP 3.8 12/22/2023  A1GS 0.2 12/22/2023   A2GS 0.4 12/22/2023   BETS 0.7 12/22/2023   GAMS 0.8 12/22/2023   MSPIKE 0.4 (H) 12/22/2023   SPEI Comment 05/29/2023     Chemistry      Component Value Date/Time   NA 141 04/26/2024 1013   NA 142 10/15/2019 1500   K 4.3 04/26/2024 1013   CL 104 04/26/2024 1013   CO2 27 04/26/2024 1013   BUN 20 04/26/2024 1013   BUN 25 10/15/2019 1500   CREATININE 1.09 04/26/2024 1013      Component Value Date/Time   CALCIUM  9.3 04/26/2024 1013   ALKPHOS 88 04/26/2024 1013   AST 38 04/26/2024 1013   ALT 16 04/26/2024 1013   BILITOT 0.8 04/26/2024 1013     Impression and Plan: Riley Eaton is a very pleasant 83 yo caucasian gentleman with IgG Kappa myeloma.  We are watching him.  He still has the low white cells and platelets.  However, he is totally asymptomatic with this.  We will plan to get him back right before Thanksgiving.  If all looks good at that point, we will then try to get him through the holidays and  the Winter.    Maude JONELLE Crease, MD 9/15/202511:25 AM

## 2024-04-27 LAB — KAPPA/LAMBDA LIGHT CHAINS
Kappa free light chain: 65.8 mg/L — ABNORMAL HIGH (ref 3.3–19.4)
Kappa, lambda light chain ratio: 3.64 — ABNORMAL HIGH (ref 0.26–1.65)
Lambda free light chains: 18.1 mg/L (ref 5.7–26.3)

## 2024-04-30 LAB — MULTIPLE MYELOMA PANEL, SERUM
Albumin SerPl Elph-Mcnc: 3.7 g/dL (ref 2.9–4.4)
Albumin/Glob SerPl: 1.5 (ref 0.7–1.7)
Alpha 1: 0.3 g/dL (ref 0.0–0.4)
Alpha2 Glob SerPl Elph-Mcnc: 0.5 g/dL (ref 0.4–1.0)
B-Globulin SerPl Elph-Mcnc: 0.7 g/dL (ref 0.7–1.3)
Gamma Glob SerPl Elph-Mcnc: 1 g/dL (ref 0.4–1.8)
Globulin, Total: 2.5 g/dL (ref 2.2–3.9)
IgA: 66 mg/dL (ref 61–437)
IgG (Immunoglobin G), Serum: 995 mg/dL (ref 603–1613)
IgM (Immunoglobulin M), Srm: 49 mg/dL (ref 15–143)
M Protein SerPl Elph-Mcnc: 0.6 g/dL — ABNORMAL HIGH
Total Protein ELP: 6.2 g/dL (ref 6.0–8.5)

## 2024-06-28 ENCOUNTER — Encounter: Payer: Self-pay | Admitting: Hematology & Oncology

## 2024-06-28 ENCOUNTER — Inpatient Hospital Stay: Attending: Hematology & Oncology

## 2024-06-28 ENCOUNTER — Inpatient Hospital Stay: Admitting: Hematology & Oncology

## 2024-06-28 VITALS — BP 117/67 | HR 53 | Temp 97.7°F | Resp 20 | Ht 67.0 in | Wt 171.1 lb

## 2024-06-28 DIAGNOSIS — C9000 Multiple myeloma not having achieved remission: Secondary | ICD-10-CM

## 2024-06-28 DIAGNOSIS — D472 Monoclonal gammopathy: Secondary | ICD-10-CM

## 2024-06-28 LAB — CBC WITH DIFFERENTIAL (CANCER CENTER ONLY)
Abs Immature Granulocytes: 0.03 K/uL (ref 0.00–0.07)
Basophils Absolute: 0 K/uL (ref 0.0–0.1)
Basophils Relative: 1 %
Eosinophils Absolute: 0.2 K/uL (ref 0.0–0.5)
Eosinophils Relative: 4 %
HCT: 32.2 % — ABNORMAL LOW (ref 39.0–52.0)
Hemoglobin: 11.2 g/dL — ABNORMAL LOW (ref 13.0–17.0)
Immature Granulocytes: 1 %
Lymphocytes Relative: 29 %
Lymphs Abs: 1.1 K/uL (ref 0.7–4.0)
MCH: 35.6 pg — ABNORMAL HIGH (ref 26.0–34.0)
MCHC: 34.8 g/dL (ref 30.0–36.0)
MCV: 102.2 fL — ABNORMAL HIGH (ref 80.0–100.0)
Monocytes Absolute: 0.3 K/uL (ref 0.1–1.0)
Monocytes Relative: 7 %
Neutro Abs: 2.3 K/uL (ref 1.7–7.7)
Neutrophils Relative %: 58 %
Platelet Count: 76 K/uL — ABNORMAL LOW (ref 150–400)
RBC: 3.15 MIL/uL — ABNORMAL LOW (ref 4.22–5.81)
RDW: 13.6 % (ref 11.5–15.5)
WBC Count: 3.8 K/uL — ABNORMAL LOW (ref 4.0–10.5)
nRBC: 0.5 % — ABNORMAL HIGH (ref 0.0–0.2)

## 2024-06-28 LAB — CMP (CANCER CENTER ONLY)
ALT: 18 U/L (ref 0–44)
AST: 39 U/L (ref 15–41)
Albumin: 4.4 g/dL (ref 3.5–5.0)
Alkaline Phosphatase: 100 U/L (ref 38–126)
Anion gap: 9 (ref 5–15)
BUN: 23 mg/dL (ref 8–23)
CO2: 28 mmol/L (ref 22–32)
Calcium: 9.2 mg/dL (ref 8.9–10.3)
Chloride: 105 mmol/L (ref 98–111)
Creatinine: 1.01 mg/dL (ref 0.61–1.24)
GFR, Estimated: >60 mL/min (ref >60)
Glucose, Bld: 155 mg/dL — ABNORMAL HIGH (ref 70–99)
Potassium: 4.6 mmol/L (ref 3.5–5.1)
Sodium: 142 mmol/L (ref 135–145)
Total Bilirubin: 0.8 mg/dL (ref 0.0–1.2)
Total Protein: 6.5 g/dL (ref 6.5–8.1)

## 2024-06-28 LAB — LACTATE DEHYDROGENASE: LDH: 450 U/L — ABNORMAL HIGH (ref 105–235)

## 2024-06-28 NOTE — Progress Notes (Signed)
 Hematology and Oncology Follow Up Visit  Riley Eaton [id number] Dec 28, 1940 83 y.o. 06/28/2024   Principle Diagnosis:  IgG Kappa myeloma  -- +1q and t(11:14)   Current Therapy:        Velcade /Decadron  -- (3 wk on/1 wk off) -- s/p cycle #12 -- started on 08/24/2021-DC on 03/06/2023  Riley Eaton - s/p cycle 6 -  started on 04/30/2022-DC on 03/06/2023     Interim History:  Mr. Riley Eaton is here today for follow-up.  Overall, he continues to do incredibly well.  He has had no treatment now for about 16 months.  He has done very nicely.     When we last saw him, his monoclonal spike was 0.6 g/dL.  His IgG level was 995 mg/dL.  The Kappa light chain is 6.6 mg/dL.  He has had no problems with nausea or vomiting.  He had no change in bowel or bladder habits.  Has had no cough or shortness of breath.  Thankfully, he has avoided COVID.  He has had no leg swelling.  He does have chronic back issues.    Overall, I will say that his performance status is probably ECOG 2.   Wt Readings from Last 3 Encounters:  06/28/24 171 lb 1.9 oz (77.6 kg)  04/26/24 171 lb (77.6 kg)  02/23/24 177 lb (80.3 kg)   Medications:  Allergies as of 06/28/2024   No Known Allergies      Medication List        Accurate as of June 28, 2024 11:49 AM. If you have any questions, ask your nurse or doctor.          acetaminophen  325 MG tablet Commonly known as: TYLENOL  Take 2 tablets (650 mg total) by mouth every 6 (six) hours as needed for mild pain (or temp > 100).   allopurinol  100 MG tablet Commonly known as: ZYLOPRIM  TAKE 1 TABLET BY MOUTH EVERY DAY   atorvastatin  40 MG tablet Commonly known as: LIPITOR Take 1 tablet (40 mg total) by mouth daily at 6 PM.   carvedilol  6.25 MG tablet Commonly known as: COREG  Take 1 tablet (6.25 mg total) by mouth 2 (two) times daily.   colchicine 0.6 MG tablet What changed: See the new instructions.   cyanocobalamin 500 MCG tablet Commonly known as: VITAMIN  B12 Take 500 mcg by mouth daily.   diphenhydramine -acetaminophen  25-500 MG Tabs tablet Commonly known as: TYLENOL  PM Take 1 tablet by mouth at bedtime.   Enulose 10 GM/15ML Soln Generic drug: lactulose (encephalopathy) 10 g daily as needed.   finasteride  5 MG tablet Commonly known as: PROSCAR  Take 1 tablet (5 mg total) by mouth daily. For urination   furosemide  40 MG tablet Commonly known as: LASIX  TAKE 1 TABLET BY MOUTH EVERY DAY   HYDROcodone -acetaminophen  7.5-325 MG tablet Commonly known as: NORCO Take 1 tablet by mouth 4 (four) times daily as needed for moderate pain.   lactulose 10 GM/15ML solution Commonly known as: CHRONULAC Take 10 g by mouth daily as needed.   LORazepam  0.5 MG tablet Commonly known as: ATIVAN  TAKE 1 TABLET (0.5 MG TOTAL) BY MOUTH EVERY 6 (SIX) HOURS AS NEEDED (NAUSEA OR VOMITING).   ondansetron  4 MG disintegrating tablet Commonly known as: Zofran  ODT Take 1 tablet (4 mg total) by mouth every 8 (eight) hours as needed for nausea or vomiting.   pantoprazole  40 MG tablet Commonly known as: PROTONIX  Take 1 tablet (40 mg total) by mouth at bedtime.   polyethylene glycol 17  g packet Commonly known as: MIRALAX  / GLYCOLAX  Take 17 g by mouth 2 (two) times a week.   pregabalin  150 MG capsule Commonly known as: LYRICA  Take 150 mg by mouth 2 (two) times daily.   traZODone  50 MG tablet Commonly known as: DESYREL  Take 50 mg by mouth at bedtime.        Allergies: No Known Allergies  Past Medical History, Surgical history, Social history, and Family History were reviewed and updated.  Review of Systems: Review of Systems  Constitutional: Negative.   HENT: Negative.    Eyes: Negative.   Respiratory: Negative.    Cardiovascular: Negative.   Gastrointestinal: Negative.   Genitourinary: Negative.   Musculoskeletal:  Positive for back pain.  Skin: Negative.   Neurological: Negative.   Endo/Heme/Allergies: Negative.   Psychiatric/Behavioral:  Negative.     Physical Exam:  height is 5' 7 (1.702 m) and weight is 171 lb 1.9 oz (77.6 kg). His oral temperature is 97.7 F (36.5 C). His blood pressure is 117/67 and his pulse is 53 (abnormal). His respiration is 20 and oxygen  saturation is 98%.   Wt Readings from Last 3 Encounters:  06/28/24 171 lb 1.9 oz (77.6 kg)  04/26/24 171 lb (77.6 kg)  02/23/24 177 lb (80.3 kg)   Physical Exam Vitals reviewed.  HENT:     Head: Normocephalic and atraumatic.  Eyes:     Pupils: Pupils are equal, round, and reactive to light.  Cardiovascular:     Rate and Rhythm: Normal rate and regular rhythm.     Heart sounds: Normal heart sounds.  Pulmonary:     Effort: Pulmonary effort is normal.     Breath sounds: Normal breath sounds.  Abdominal:     General: Bowel sounds are normal.     Palpations: Abdomen is soft.  Musculoskeletal:        General: No tenderness or deformity. Normal range of motion.     Cervical back: Normal range of motion.  Lymphadenopathy:     Cervical: No cervical adenopathy.  Skin:    General: Skin is warm and dry.     Findings: No erythema or rash.  Neurological:     Mental Status: He is alert and oriented to person, place, and time.  Psychiatric:        Behavior: Behavior normal.        Thought Content: Thought content normal.        Judgment: Judgment normal.      Lab Results  Component Value Date   WBC 3.8 (L) 06/28/2024   HGB 11.2 (L) 06/28/2024   HCT 32.2 (L) 06/28/2024   MCV 102.2 (H) 06/28/2024   PLT 76 (L) 06/28/2024   No results found for: FERRITIN, IRON, TIBC, UIBC, IRONPCTSAT Lab Results  Component Value Date   RBC 3.15 (L) 06/28/2024   Lab Results  Component Value Date   KPAFRELGTCHN 65.8 (H) 04/26/2024   LAMBDASER 18.1 04/26/2024   KAPLAMBRATIO 3.64 (H) 04/26/2024   Lab Results  Component Value Date   IGGSERUM 995 04/26/2024   IGA 66 04/26/2024   IGMSERUM 49 04/26/2024   Lab Results  Component Value Date   TOTALPROTELP  6.2 04/26/2024   ALBUMINELP 3.8 12/22/2023   A1GS 0.2 12/22/2023   A2GS 0.4 12/22/2023   BETS 0.7 12/22/2023   GAMS 0.8 12/22/2023   MSPIKE 0.4 (H) 12/22/2023   SPEI Comment 05/29/2023     Chemistry      Component Value Date/Time   NA 142  06/28/2024 1015   NA 142 10/15/2019 1500   K 4.6 06/28/2024 1015   CL 105 06/28/2024 1015   CO2 28 06/28/2024 1015   BUN 23 06/28/2024 1015   BUN 25 10/15/2019 1500   CREATININE 1.01 06/28/2024 1015      Component Value Date/Time   CALCIUM  9.2 06/28/2024 1015   ALKPHOS 100 06/28/2024 1015   AST 39 06/28/2024 1015   ALT 18 06/28/2024 1015   BILITOT 0.8 06/28/2024 1015     Impression and Plan: Mr. Lamantia is a very pleasant 83 yo caucasian gentleman with IgG Kappa myeloma.  We are watching him.  He still has the low white cells and platelets.  However, he is totally asymptomatic with this.  At this point, we will now plan to get him back after the Holidays.  I think this would be wonderful.  I suppose that we could always have to think about treating him but for right now, I just do not see that we would benefit him.    Maude JONELLE Crease, MD 11/17/202511:49 AM

## 2024-06-29 LAB — KAPPA/LAMBDA LIGHT CHAINS
Kappa free light chain: 83.4 mg/L — ABNORMAL HIGH (ref 3.3–19.4)
Kappa, lambda light chain ratio: 3.77 — ABNORMAL HIGH (ref 0.26–1.65)
Lambda free light chains: 22.1 mg/L (ref 5.7–26.3)

## 2024-06-29 LAB — IGG, IGA, IGM
IgA: 71 mg/dL (ref 61–437)
IgG (Immunoglobin G), Serum: 1089 mg/dL (ref 603–1613)
IgM (Immunoglobulin M), Srm: 40 mg/dL (ref 15–143)

## 2024-07-01 LAB — PROTEIN ELECTROPHORESIS, SERUM, WITH REFLEX
A/G Ratio: 1.4 (ref 0.7–1.7)
Albumin ELP: 3.6 g/dL (ref 2.9–4.4)
Alpha-1-Globulin: 0.3 g/dL (ref 0.0–0.4)
Alpha-2-Globulin: 0.5 g/dL (ref 0.4–1.0)
Beta Globulin: 0.7 g/dL (ref 0.7–1.3)
Gamma Globulin: 1.1 g/dL (ref 0.4–1.8)
Globulin, Total: 2.5 g/dL (ref 2.2–3.9)
M-Spike, %: 0.5 g/dL — ABNORMAL HIGH
SPEP Interpretation: 0
Total Protein ELP: 6.1 g/dL (ref 6.0–8.5)

## 2024-07-01 LAB — IMMUNOFIXATION REFLEX, SERUM
IgA: 77 mg/dL (ref 61–437)
IgG (Immunoglobin G), Serum: 1196 mg/dL (ref 603–1613)
IgM (Immunoglobulin M), Srm: 48 mg/dL (ref 15–143)

## 2024-08-09 ENCOUNTER — Other Ambulatory Visit: Payer: Self-pay

## 2024-08-09 ENCOUNTER — Emergency Department (HOSPITAL_BASED_OUTPATIENT_CLINIC_OR_DEPARTMENT_OTHER)

## 2024-08-09 ENCOUNTER — Encounter (HOSPITAL_BASED_OUTPATIENT_CLINIC_OR_DEPARTMENT_OTHER): Payer: Self-pay

## 2024-08-09 ENCOUNTER — Emergency Department (HOSPITAL_BASED_OUTPATIENT_CLINIC_OR_DEPARTMENT_OTHER)
Admission: EM | Admit: 2024-08-09 | Discharge: 2024-08-09 | Disposition: A | Discharge status: Home / Self Care | Source: Home / Self Care | Attending: Emergency Medicine | Admitting: Emergency Medicine

## 2024-08-09 DIAGNOSIS — I2510 Atherosclerotic heart disease of native coronary artery without angina pectoris: Secondary | ICD-10-CM | POA: Insufficient documentation

## 2024-08-09 DIAGNOSIS — J101 Influenza due to other identified influenza virus with other respiratory manifestations: Secondary | ICD-10-CM | POA: Insufficient documentation

## 2024-08-09 DIAGNOSIS — N189 Chronic kidney disease, unspecified: Secondary | ICD-10-CM | POA: Insufficient documentation

## 2024-08-09 LAB — RESP PANEL BY RT-PCR (RSV, FLU A&B, COVID)  RVPGX2
Influenza A by PCR: POSITIVE — AB
Influenza B by PCR: NEGATIVE
Resp Syncytial Virus by PCR: NEGATIVE
SARS Coronavirus 2 by RT PCR: NEGATIVE

## 2024-08-09 LAB — CBC
HCT: 31.7 % — ABNORMAL LOW (ref 39.0–52.0)
Hemoglobin: 11.3 g/dL — ABNORMAL LOW (ref 13.0–17.0)
MCH: 35.3 pg — ABNORMAL HIGH (ref 26.0–34.0)
MCHC: 35.6 g/dL (ref 30.0–36.0)
MCV: 99.1 fL (ref 80.0–100.0)
Platelets: 59 K/uL — ABNORMAL LOW (ref 150–400)
RBC: 3.2 MIL/uL — ABNORMAL LOW (ref 4.22–5.81)
RDW: 13.2 % (ref 11.5–15.5)
WBC: 4.9 K/uL (ref 4.0–10.5)
nRBC: 0 % (ref 0.0–0.2)

## 2024-08-09 LAB — BASIC METABOLIC PANEL WITH GFR
Anion gap: 11 (ref 5–15)
BUN: 24 mg/dL — ABNORMAL HIGH (ref 8–23)
CO2: 25 mmol/L (ref 22–32)
Calcium: 9.2 mg/dL (ref 8.9–10.3)
Chloride: 102 mmol/L (ref 98–111)
Creatinine, Ser: 1.11 mg/dL (ref 0.61–1.24)
GFR, Estimated: >60 mL/min
Glucose, Bld: 100 mg/dL — ABNORMAL HIGH (ref 70–99)
Potassium: 3.7 mmol/L (ref 3.5–5.1)
Sodium: 138 mmol/L (ref 135–145)

## 2024-08-09 LAB — TROPONIN T, HIGH SENSITIVITY
Troponin T High Sensitivity: 30 ng/L — ABNORMAL HIGH (ref 0–19)
Troponin T High Sensitivity: 28 ng/L — ABNORMAL HIGH (ref 0–19)

## 2024-08-09 MED ORDER — OSELTAMIVIR PHOSPHATE 75 MG PO CAPS: 75.0000 mg | ORAL_CAPSULE | Freq: Two times a day (BID) | ORAL | 0 refills | Status: DC

## 2024-08-09 MED ORDER — OSELTAMIVIR PHOSPHATE 75 MG PO CAPS
75.0000 mg | ORAL_CAPSULE | Freq: Once | ORAL | Status: AC
  Administered 2024-08-09: 75 mg via ORAL
  Filled 2024-08-09: qty 1

## 2024-08-09 MED ORDER — ACETAMINOPHEN 500 MG PO TABS
1000.0000 mg | ORAL_TABLET | Freq: Once | ORAL | Status: AC
  Administered 2024-08-09: 1000 mg via ORAL
  Filled 2024-08-09: qty 2

## 2024-08-09 NOTE — Discharge Instructions (Signed)
 You tested positive for influenza A today.  This is a common virus.  Recommend 1000 mg of Tylenol  every 6 hours as needed for body aches fever chills.  Take Tamiflu  as prescribed.  Return if symptoms worsen.

## 2024-08-09 NOTE — ED Provider Notes (Signed)
 " [facility] Provider Note   CSN: [id number] Arrival date & time: 08/09/24  9062     Patient presents with: Cough and Chest Pain   [place] R Plant is a 83 y.o. male.   Patient here with flulike symptoms for the last couple days.  Nothing makes it worse or better.  History of chronic back pain CAD MGUS CKD.  Denies any nausea vomiting diarrhea.  Unaware of any sick contacts.  The history is provided by the patient.       Prior to Admission medications  Medication Sig Start Date End Date Taking? Authorizing Provider  oseltamivir  (TAMIFLU ) 75 MG capsule Take 1 capsule (75 mg total) by mouth every 12 (twelve) hours. 08/09/24  Yes Yilin Weedon, DO  acetaminophen  (TYLENOL ) 325 MG tablet Take 2 tablets (650 mg total) by mouth every 6 (six) hours as needed for mild pain (or temp > 100). 08/08/20   Sherrill Cable Latif, DO  allopurinol  (ZYLOPRIM ) 100 MG tablet TAKE 1 TABLET BY MOUTH EVERY DAY 03/16/19   Jenel Carlin POUR, MD  atorvastatin  (LIPITOR) 40 MG tablet Take 1 tablet (40 mg total) by mouth daily at 6 PM. 08/02/19   Swayze, Ava, DO  carvedilol  (COREG ) 6.25 MG tablet Take 1 tablet (6.25 mg total) by mouth 2 (two) times daily. 09/24/23   Court Dorn PARAS, MD  colchicine 0.6 MG tablet  07/18/20   [provider]  diphenhydramine -acetaminophen  (TYLENOL  PM) 25-500 MG TABS tablet Take 1 tablet by mouth at bedtime.    [provider]  ENULOSE 10 GM/15ML SOLN 10 g daily as needed. Patient not taking: Reported on 06/28/2024 04/14/24   [provider]  finasteride  (PROSCAR ) 5 MG tablet Take 1 tablet (5 mg total) by mouth daily. For urination 07/27/19   Alvaro Ricardo KATHEE Mickey., MD  furosemide  (LASIX ) 40 MG tablet TAKE 1 TABLET BY MOUTH EVERY DAY 06/27/23   Court Dorn PARAS, MD  HYDROcodone -acetaminophen  (NORCO) 7.5-325 MG tablet Take 1 tablet by mouth 4 (four) times daily as needed for moderate pain. 08/15/22   [provider]  lactulose (CHRONULAC) 10 GM/15ML solution Take 10 g by mouth daily as needed. 04/14/24   [provider]  LORazepam  (ATIVAN ) 0.5 MG tablet TAKE 1 TABLET (0.5 MG TOTAL) BY MOUTH EVERY 6 (SIX) HOURS AS NEEDED (NAUSEA OR VOMITING). 07/31/23   Timmy Maude SAUNDERS, MD  ondansetron  (ZOFRAN  ODT) 4 MG disintegrating tablet Take 1 tablet (4 mg total) by mouth every 8 (eight) hours as needed for nausea or vomiting. Patient not taking: Reported on 06/28/2024 08/03/20   Schuyler Charlie RAMAN, MD  pantoprazole  (PROTONIX ) 40 MG tablet Take 1 tablet (40 mg total) by mouth at bedtime. 08/02/19   Swayze, Ava, DO  polyethylene glycol (MIRALAX  / GLYCOLAX ) 17 g packet Take 17 g by mouth 2 (two) times a week.    [provider]  pregabalin  (LYRICA ) 150 MG capsule Take 150 mg by mouth 2 (two) times daily. 06/01/20   [provider]  traZODone  (DESYREL ) 50 MG tablet Take 50 mg by mouth at bedtime. 05/17/19   [provider]  vitamin B-12 (CYANOCOBALAMIN) 500 MCG tablet Take 500 mcg by mouth daily.    [provider]    Allergies: Patient has no known allergies.    Review of Systems  Updated Vital Signs BP (!) 136/95 (BP Location: Right Arm)   Pulse 64   Temp 98.1 F (36.7 C) (Oral)  Resp 20   SpO2 99%   Physical Exam Vitals and nursing note reviewed.  Constitutional:      General: He is not in acute distress.    Appearance: He is well-developed. He is not ill-appearing.  HENT:     Head: Normocephalic and atraumatic.  Eyes:     Extraocular Movements: Extraocular movements intact.     Conjunctiva/sclera: Conjunctivae normal.     Pupils: Pupils are equal, round, and reactive to light.  Cardiovascular:     Rate and Rhythm: Normal rate and regular rhythm.     Pulses:          Radial pulses are 2+ on the right side and 2+ on the left side.     Heart sounds: No murmur heard. Pulmonary:     Effort: Pulmonary effort is normal. No respiratory distress.      Breath sounds: Normal breath sounds. No decreased breath sounds or wheezing.  Abdominal:     Palpations: Abdomen is soft.     Tenderness: There is no abdominal tenderness.  Musculoskeletal:        General: No swelling.     Cervical back: Normal range of motion and neck supple.  Skin:    General: Skin is warm and dry.     Capillary Refill: Capillary refill takes less than 2 seconds.  Neurological:     General: No focal deficit present.     Mental Status: He is alert.  Psychiatric:        Mood and Affect: Mood normal.     (all labs ordered are listed, but only abnormal results are displayed) Labs Reviewed  RESP PANEL BY RT-PCR (RSV, FLU A&B, COVID)  RVPGX2 - Abnormal; Notable for the following components:      Result Value   Influenza A by PCR POSITIVE (*)    All other components within normal limits  BASIC METABOLIC PANEL WITH GFR - Abnormal; Notable for the following components:   Glucose, Bld 100 (*)    BUN 24 (*)    All other components within normal limits  CBC - Abnormal; Notable for the following components:   RBC 3.20 (*)    Hemoglobin 11.3 (*)    HCT 31.7 (*)    MCH 35.3 (*)    Platelets 59 (*)    All other components within normal limits  TROPONIN T, HIGH SENSITIVITY - Abnormal; Notable for the following components:   Troponin T High Sensitivity 30 (*)    All other components within normal limits  TROPONIN T, HIGH SENSITIVITY - Abnormal; Notable for the following components:   Troponin T High Sensitivity 28 (*)    All other components within normal limits    EKG: None  Radiology: DG Chest 2 View Result Date: 08/09/2024 EXAM: 2 VIEW(S) XRAY OF THE CHEST 08/09/2024 10:06:00 AM COMPARISON: Chest x-ray 07/17/2019, CT chest 06/12/2022. CLINICAL HISTORY: chest pain FINDINGS: LUNGS AND PLEURA: Elevated right hemidiaphragm. Large hiatal hernia with gas noted within its lumen. No focal pulmonary opacity. No pleural effusion. No pneumothorax. HEART AND MEDIASTINUM:  Aortic arch calcifications. BONES AND SOFT TISSUES: No acute osseous abnormality. A 1 cm nodule-like calcified lesion overlies the anterolateral sixth right rib consistent with known bone island. IMPRESSION: 1. No acute cardiopulmonary abnormality. 2. Large hiatal hernia. Electronically signed by: Morgane Naveau MD 08/09/2024 12:22 PM EST RP Workstation: [id number]     Procedures   Medications Ordered in the ED  acetaminophen  (TYLENOL ) tablet 1,000 mg (1,000 mg Oral Given 08/09/24  1323)  oseltamivir  (TAMIFLU ) capsule 75 mg (75 mg Oral Given 08/09/24 1323)                                    Medical Decision Making Amount and/or Complexity of Data Reviewed Labs: ordered. Radiology: ordered.  Risk OTC drugs. Prescription drug management.   Harrell R Carpenito is here with flulike illness.  Normal vitals.  No fever.  History of CAD.  Has not chills the last few days.  Differential diagnosis likely flu but given cardiac history will evaluate for ACS.  He has not had any nausea vomiting diarrhea.  Chest x-ray showed no evidence of pneumonia pneumothorax.  EKG shows sinus rhythm.  Troponin flat x 2.  He does have some mild CKD which I suspect is likely causing mild troponin leak.  Influenza A was positive.  Will give Tylenol  and Tamiflu  here in the ED.  He has unremarkable vitals and labs otherwise.  No significant leukocytosis anemia or electrolyte abnormality.  Continue supportive care at home.  Will prescribe Tamiflu .  Discharged in good condition.  Follow-up with primary care.  Told to return if symptoms worsen.  This chart was dictated using voice recognition software.  Despite best efforts to proofread,  errors can occur which can change the documentation meaning.      Final diagnoses:  Influenza A    ED Discharge Orders          Ordered    oseltamivir  (TAMIFLU ) 75 MG capsule  Every 12 hours        08/09/24 1405               Ruthe Cornet, DO 08/09/24 1406  "

## 2024-08-09 NOTE — ED Triage Notes (Signed)
 Reports cough, congestion, headache, fatigue, chest pain when coughing for 3 days

## 2024-08-10 NOTE — Progress Notes (Signed)
 [facility] Transitional Care Management     Situation   Riley Eaton is a 83 y.o. male who was contacted today for ED outreach.  Admission Date: 08/09/24  Discharge Date: 08/09/24 ED   Institution: [facility]    Diagnosis:  Flu A  Is this visit eligible for TCM? No ED  Background   Since Discharge: Spoke with pt reports having had flu symptoms approx 5 days, c/o sore throat. Pt reports he is feeling a little better since starting meds, denies CP, SOB or new/worsening issues. Pt reports he is eating small amounts of soups, drinking plenty of fluids. Pt declines need for pcp f/u at time of call. Pt aware to call HN or MD if needed. Appreciative of HN call.   Primary Care Provider on Record: Kristen Diane Kaplan, PA-C   Assessment    General Assessment     Type of Visit Telephone   Assessment Completed With Patient   Interpreter Used No   Preferred Language English   Appt Made -- * pt declines   Living Arrangement Alone   Support System Children       Recommendation    PCP/specialist notified: No  Referral Made: No  Referrals made to other disciplines: None   Future Appointments  Date Time Provider Department Center  09/30/2024  1:00 PM Josette Loa Aho, PA-C [facility] [id number]     Ellouise Quale, RN CH[facility] Navigator [phone]   Electronically signed by: Ellouise CHRISTELLA Quale, RN 08/10/2024 4:35 PM    *Some images could not be shown.

## 2024-08-11 ENCOUNTER — Other Ambulatory Visit: Payer: Self-pay

## 2024-08-11 ENCOUNTER — Encounter (HOSPITAL_COMMUNITY): Payer: Self-pay | Admitting: Emergency Medicine

## 2024-08-11 ENCOUNTER — Inpatient Hospital Stay (HOSPITAL_COMMUNITY)
Admission: EM | Admit: 2024-08-11 | Discharge: 2024-08-13 | DRG: 193 | Disposition: A | Discharge status: Home / Self Care | Attending: Family Medicine | Admitting: Family Medicine

## 2024-08-11 ENCOUNTER — Emergency Department (HOSPITAL_COMMUNITY)

## 2024-08-11 DIAGNOSIS — E860 Dehydration: Secondary | ICD-10-CM | POA: Diagnosis present

## 2024-08-11 DIAGNOSIS — R0682 Tachypnea, not elsewhere classified: Secondary | ICD-10-CM

## 2024-08-11 DIAGNOSIS — Z66 Do not resuscitate: Secondary | ICD-10-CM | POA: Diagnosis present

## 2024-08-11 DIAGNOSIS — Z87891 Personal history of nicotine dependence: Secondary | ICD-10-CM | POA: Diagnosis not present

## 2024-08-11 DIAGNOSIS — I252 Old myocardial infarction: Secondary | ICD-10-CM | POA: Diagnosis not present

## 2024-08-11 DIAGNOSIS — J9600 Acute respiratory failure, unspecified whether with hypoxia or hypercapnia: Secondary | ICD-10-CM | POA: Diagnosis present

## 2024-08-11 DIAGNOSIS — C9000 Multiple myeloma not having achieved remission: Secondary | ICD-10-CM | POA: Diagnosis present

## 2024-08-11 DIAGNOSIS — Z79899 Other long term (current) drug therapy: Secondary | ICD-10-CM | POA: Diagnosis not present

## 2024-08-11 DIAGNOSIS — N1831 Chronic kidney disease, stage 3a: Secondary | ICD-10-CM | POA: Diagnosis not present

## 2024-08-11 DIAGNOSIS — R8281 Pyuria: Secondary | ICD-10-CM

## 2024-08-11 DIAGNOSIS — E785 Hyperlipidemia, unspecified: Secondary | ICD-10-CM | POA: Diagnosis present

## 2024-08-11 DIAGNOSIS — E876 Hypokalemia: Secondary | ICD-10-CM | POA: Diagnosis present

## 2024-08-11 DIAGNOSIS — N390 Urinary tract infection, site not specified: Secondary | ICD-10-CM | POA: Diagnosis present

## 2024-08-11 DIAGNOSIS — D631 Anemia in chronic kidney disease: Secondary | ICD-10-CM | POA: Diagnosis present

## 2024-08-11 DIAGNOSIS — I2510 Atherosclerotic heart disease of native coronary artery without angina pectoris: Secondary | ICD-10-CM | POA: Diagnosis present

## 2024-08-11 DIAGNOSIS — I7121 Aneurysm of the ascending aorta, without rupture: Secondary | ICD-10-CM

## 2024-08-11 DIAGNOSIS — D472 Monoclonal gammopathy: Secondary | ICD-10-CM | POA: Diagnosis present

## 2024-08-11 DIAGNOSIS — Z9079 Acquired absence of other genital organ(s): Secondary | ICD-10-CM

## 2024-08-11 DIAGNOSIS — Z8249 Family history of ischemic heart disease and other diseases of the circulatory system: Secondary | ICD-10-CM | POA: Diagnosis not present

## 2024-08-11 DIAGNOSIS — I712 Thoracic aortic aneurysm, without rupture, unspecified: Secondary | ICD-10-CM | POA: Diagnosis present

## 2024-08-11 DIAGNOSIS — I5042 Chronic combined systolic (congestive) and diastolic (congestive) heart failure: Secondary | ICD-10-CM | POA: Diagnosis present

## 2024-08-11 DIAGNOSIS — I130 Hypertensive heart and chronic kidney disease with heart failure and stage 1 through stage 4 chronic kidney disease, or unspecified chronic kidney disease: Secondary | ICD-10-CM | POA: Diagnosis present

## 2024-08-11 DIAGNOSIS — E782 Mixed hyperlipidemia: Secondary | ICD-10-CM

## 2024-08-11 DIAGNOSIS — Z9049 Acquired absence of other specified parts of digestive tract: Secondary | ICD-10-CM

## 2024-08-11 DIAGNOSIS — N400 Enlarged prostate without lower urinary tract symptoms: Secondary | ICD-10-CM | POA: Diagnosis present

## 2024-08-11 DIAGNOSIS — N1832 Chronic kidney disease, stage 3b: Secondary | ICD-10-CM | POA: Diagnosis present

## 2024-08-11 DIAGNOSIS — J101 Influenza due to other identified influenza virus with other respiratory manifestations: Secondary | ICD-10-CM | POA: Diagnosis present

## 2024-08-11 DIAGNOSIS — I5022 Chronic systolic (congestive) heart failure: Secondary | ICD-10-CM | POA: Diagnosis not present

## 2024-08-11 DIAGNOSIS — R9431 Abnormal electrocardiogram [ECG] [EKG]: Secondary | ICD-10-CM

## 2024-08-11 DIAGNOSIS — J111 Influenza due to unidentified influenza virus with other respiratory manifestations: Principal | ICD-10-CM

## 2024-08-11 LAB — PROTIME-INR
INR: 1.1 (ref 0.8–1.2)
Prothrombin Time: 14.9 s (ref 11.4–15.2)

## 2024-08-11 LAB — I-STAT VENOUS BLOOD GAS, ED
Acid-Base Excess: 0 mmol/L (ref 0.0–2.0)
Bicarbonate: 22.1 mmol/L (ref 20.0–28.0)
Calcium, Ion: 1.09 mmol/L — ABNORMAL LOW (ref 1.15–1.40)
HCT: 34 % — ABNORMAL LOW (ref 39.0–52.0)
Hemoglobin: 11.6 g/dL — ABNORMAL LOW (ref 13.0–17.0)
O2 Saturation: 68 %
Potassium: 3.4 mmol/L — ABNORMAL LOW (ref 3.5–5.1)
Sodium: 136 mmol/L (ref 135–145)
TCO2: 23 mmol/L (ref 22–32)
pCO2, Ven: 28 mmHg — ABNORMAL LOW (ref 44–60)
pH, Ven: 7.506 — ABNORMAL HIGH (ref 7.25–7.43)
pO2, Ven: 31 mmHg — CL (ref 32–45)

## 2024-08-11 LAB — CBC WITH DIFFERENTIAL/PLATELET
Abs Immature Granulocytes: 0.13 K/uL — ABNORMAL HIGH (ref 0.00–0.07)
Basophils Absolute: 0 K/uL (ref 0.0–0.1)
Basophils Relative: 0 %
Eosinophils Absolute: 0 K/uL (ref 0.0–0.5)
Eosinophils Relative: 0 %
HCT: 32 % — ABNORMAL LOW (ref 39.0–52.0)
Hemoglobin: 11.8 g/dL — ABNORMAL LOW (ref 13.0–17.0)
Immature Granulocytes: 2 %
Lymphocytes Relative: 16 %
Lymphs Abs: 1.3 K/uL (ref 0.7–4.0)
MCH: 35.5 pg — ABNORMAL HIGH (ref 26.0–34.0)
MCHC: 36.9 g/dL — ABNORMAL HIGH (ref 30.0–36.0)
MCV: 96.4 fL (ref 80.0–100.0)
Monocytes Absolute: 0.9 K/uL (ref 0.1–1.0)
Monocytes Relative: 12 %
Neutro Abs: 5.5 K/uL (ref 1.7–7.7)
Neutrophils Relative %: 70 %
Platelets: 68 K/uL — ABNORMAL LOW (ref 150–400)
RBC: 3.32 MIL/uL — ABNORMAL LOW (ref 4.22–5.81)
RDW: 13 % (ref 11.5–15.5)
WBC: 7.8 K/uL (ref 4.0–10.5)
nRBC: 0 % (ref 0.0–0.2)

## 2024-08-11 LAB — I-STAT CG4 LACTIC ACID, ED
Lactic Acid, Venous: 2 mmol/L (ref 0.5–1.9)
Lactic Acid, Venous: 1.1 mmol/L (ref 0.5–1.9)

## 2024-08-11 LAB — COMPREHENSIVE METABOLIC PANEL WITH GFR
ALT: 36 U/L (ref 0–44)
AST: 68 U/L — ABNORMAL HIGH (ref 15–41)
Albumin: 4.1 g/dL (ref 3.5–5.0)
Alkaline Phosphatase: 144 U/L — ABNORMAL HIGH (ref 38–126)
Anion gap: 16 — ABNORMAL HIGH (ref 5–15)
BUN: 24 mg/dL — ABNORMAL HIGH (ref 8–23)
CO2: 21 mmol/L — ABNORMAL LOW (ref 22–32)
Calcium: 9.4 mg/dL (ref 8.9–10.3)
Chloride: 99 mmol/L (ref 98–111)
Creatinine, Ser: 1.1 mg/dL (ref 0.61–1.24)
GFR, Estimated: >60 mL/min
Glucose, Bld: 150 mg/dL — ABNORMAL HIGH (ref 70–99)
Potassium: 3.4 mmol/L — ABNORMAL LOW (ref 3.5–5.1)
Sodium: 136 mmol/L (ref 135–145)
Total Bilirubin: 1.4 mg/dL — ABNORMAL HIGH (ref 0.0–1.2)
Total Protein: 6.9 g/dL (ref 6.5–8.1)

## 2024-08-11 LAB — URINALYSIS, W/ REFLEX TO CULTURE (INFECTION SUSPECTED)
Bilirubin Urine: NEGATIVE
Glucose, UA: NEGATIVE mg/dL
Ketones, ur: 20 mg/dL — AB
Nitrite: POSITIVE — AB
Protein, ur: 30 mg/dL — AB
Specific Gravity, Urine: 1.027 (ref 1.005–1.030)
pH: 5 (ref 5.0–8.0)

## 2024-08-11 LAB — PRO BRAIN NATRIURETIC PEPTIDE: Pro Brain Natriuretic Peptide: 632 pg/mL — ABNORMAL HIGH

## 2024-08-11 MED ORDER — IOHEXOL 350 MG/ML SOLN
75.0000 mL | Freq: Once | INTRAVENOUS | Status: AC | PRN
  Administered 2024-08-11: 75 mL via INTRAVENOUS

## 2024-08-11 MED ORDER — SODIUM CHLORIDE 0.9 % IV SOLN
1.0000 g | INTRAVENOUS | Status: DC
  Administered 2024-08-11 – 2024-08-13 (×2): 1 g via INTRAVENOUS
  Filled 2024-08-11 (×2): qty 10

## 2024-08-11 MED ORDER — POTASSIUM CHLORIDE 10 MEQ/100ML IV SOLN
10.0000 meq | INTRAVENOUS | Status: AC
  Administered 2024-08-11 – 2024-08-12 (×2): 10 meq via INTRAVENOUS
  Filled 2024-08-11 (×2): qty 100

## 2024-08-11 NOTE — ED Notes (Signed)
 Straight cath. PT to attain urinalysis.[name] also cleaned from incontinence.

## 2024-08-11 NOTE — Assessment & Plan Note (Signed)
 Chronic stable avoid overaggressive fluid resuscitation.  Patient does appear to be somewhat dehydrated hold Lasix  for tonight

## 2024-08-11 NOTE — Assessment & Plan Note (Signed)
-  will monitor on tele avoid QT prolonging medications, rehydrate correct electrolytes ? ?

## 2024-08-11 NOTE — H&P (Signed)
 "    Riley Eaton:[id number] DOB: Jan 12, 1941 DOA: 08/11/2024     PCP: Debrah Josette ORN., PA-C   Outpatient Specialists: * NONE CARDS:  Dr. Dorn Lesches, MD    Oncology  Dr.Ennever, Riley JONELLE, MD   Patient arrived to ER on 08/11/24 at 1817 Referred by Attending Riley Lot, MD   Patient coming from:    home Lives alone,       Chief Complaint:   Chief Complaint  Patient presents with   Shortness of Breath   Nausea    HPI: Riley Eaton is a 83 y.o. male with medical history significant of CAD, CKD 3B, IgG myeloma, MGUS, systolic CHF, GERD, HTN, gout, iron deficiency anemia, neuropathy, BPH, chronic back pain, HLD, and anxiety    Presented with increased work of breathing and cough 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    Has had diarrhea for one day Reports severe sore throat Denies chest pain but has muscle soreness from coughing Denies dysuria, does endorse some abd pain   Denies significant ETOH intake   Does not smoke     Regarding pertinent Chronic problems:    Hyperlipidemia -  on statins Lipitor (atorvastatin )  Lipid Panel     Component Value Date/Time   CHOL 103 09/03/2019 0822   TRIG 57 09/03/2019 0822   HDL 58 09/03/2019 0822   CHOLHDL 1.8 09/03/2019 0822   LDLCALC 32 09/03/2019 0822   LABVLDL 13 09/03/2019 0822     HTN on Coreg ,, Proscar ,, Lasix    last echo  Recent Results (from the past 56199 hours)  ECHOCARDIOGRAM COMPLETE   Collection Time: 02/27/24 10:45 AM  Result  Value   Area-P 1/2 2.53   S' Lateral 3.10   P 1/2 time 644   Est EF 60 - 65%   Narrative      ECHOCARDIOGRAM REPORT     IMPRESSIONS    1. Left ventricular ejection fraction, by estimation, is 60 to 65%. The left ventricle has normal function. The left ventricle has no regional wall motion abnormalities. There is mild left ventricular hypertrophy of the basal-septal segment. Left  ventricular diastolic parameters are indeterminate. The average left ventricular global longitudinal strain is -19.1 %. The global longitudinal strain is normal.  2. Right ventricular systolic function is normal. The right ventricular size is mildly enlarged.  3. Left atrial size was moderately dilated.  4. The mitral valve is normal in structure. Mild mitral valve regurgitation. No evidence of mitral stenosis.  5. The aortic valve is tricuspid. There is mild calcification of the aortic valve. Aortic valve regurgitation is trivial. Aortic valve sclerosis is present, with no evidence of aortic valve stenosis.  6. Aortic dilatation noted. There is mild dilatation of the ascending aorta, measuring 41 mm.  Comparison(s): No significant change from prior study.            CAD  - On Aspirin , statin, betablocker,  CKD stage IIIa  baseline Cr 1.1 Estimated Creatinine Clearance: 46.7 mL/min (by C-G formula based on SCr of 1.1 mg/dL).  Lab Results  Component Value Date   CREATININE 1.10 08/11/2024   CREATININE 1.11 08/09/2024   CREATININE 1.01 06/28/2024   Lab Results  Component Value Date   NA 136 08/11/2024   CL 99 08/11/2024   K 3.4 (L) 08/11/2024   CO2 21 (L) 08/11/2024   BUN 24 (H) 08/11/2024   CREATININE 1.10 08/11/2024   GFRNONAA >60 08/11/2024   CALCIUM  9.4 08/11/2024   PHOS 4.6 08/01/2019   ALBUMIN 4.1 08/11/2024   GLUCOSE 150 (H) 08/11/2024      Chronic anemia - baseline hg Hemoglobin & Hematocrit  Recent Labs    08/09/24 0949 08/11/24 1850 08/11/24 1904  HGB  11.3* 11.8* 11.6*      Cancer: Multiple myeloma MGAS followed by Dr. Timmy     While in ER: CTA no pE given increased work of breathing patient started on BiPAP which have helped Clinical Course as of 08/11/24 2239  Wed Aug 11, 2024  2220 Anion gap(!): 16 No leukocytosis, fever, nor tachycardia. No hyperglycemia nor signs of DKA [LB]    Clinical Course User Index [LB] Minnie Tinnie BRAVO, PA       Lab Orders         Culture, blood (Routine x 2)         Urine Culture         Comprehensive metabolic panel         CBC with Differential         Protime-INR         Urinalysis, w/ Reflex to Culture (Infection Suspected) -Urine, Clean Catch         Pro Brain natriuretic peptide         I-Stat Lactic Acid, ED         I-Stat venous blood gas, ([facility] ED, [facility], [facility])        CXR -  NON acute    CTA chest -  1. No pulmonary embolism. 2. Small focus of infectious bronchiolitis and associated airway inflammation in the posterior segment of the right upper lobe. 3. Large hiatal hernia. 4. At least mild splenomegaly with the spleen not fully included on this examination. 5. Extensive multivessel coronary artery calcifications.  Following Medications were ordered in ER: Medications  potassium chloride  10 mEq in 100 mL IVPB (has no administration in time range)  iohexol  (OMNIPAQUE ) 350 MG/ML injection 75 mL (75 mLs Intravenous Contrast Given 08/11/24 2116)    __     ED Triage Vitals  Encounter Vitals Group     BP 08/11/24 1829 129/76     Girls Systolic BP Percentile --      Girls Diastolic BP Percentile --      Boys Systolic BP Percentile --      Boys Diastolic BP Percentile --      Pulse Rate 08/11/24 1829 94     Resp 08/11/24 1829 (!) 28     Temp 08/11/24 1829 98.4 F (36.9 C)     Temp Source 08/11/24 1829 Axillary     SpO2 08/11/24 1829 100 %     Weight 08/11/24 1830 171 lb (77.6 kg)     Height 08/11/24 1830 5' 7 (1.702 m)     Head Circumference --      Peak Flow --       Pain Score 08/11/24 1830 7  Pain Loc --      Pain Education --      Exclude from Growth Chart --   [id number]     _________________________________________ Significant initial  Findings: Abnormal Labs Reviewed  COMPREHENSIVE METABOLIC PANEL WITH GFR - Abnormal; Notable for the following components:      Result Value   Potassium 3.4 (*)    CO2 21 (*)    Glucose, Bld 150 (*)    BUN 24 (*)    AST 68 (*)    Alkaline Phosphatase 144 (*)    Total Bilirubin 1.4 (*)    Anion gap 16 (*)    All other components within normal limits  CBC WITH DIFFERENTIAL/PLATELET - Abnormal; Notable for the following components:   RBC 3.32 (*)    Hemoglobin 11.8 (*)    HCT 32.0 (*)    MCH 35.5 (*)    MCHC 36.9 (*)    Platelets 68 (*)    Abs Immature Granulocytes 0.13 (*)    All other components within normal limits  URINALYSIS, W/ REFLEX TO CULTURE (INFECTION SUSPECTED) - Abnormal; Notable for the following components:   Color, Urine AMBER (*)    APPearance HAZY (*)    Hgb urine dipstick SMALL (*)    Ketones, ur 20 (*)    Protein, ur 30 (*)    Nitrite POSITIVE (*)    Leukocytes,Ua MODERATE (*)    Bacteria, UA FEW (*)    All other components within normal limits  PRO BRAIN NATRIURETIC PEPTIDE - Abnormal; Notable for the following components:   Pro Brain Natriuretic Peptide 632.0 (*)    All other components within normal limits  I-STAT CG4 LACTIC ACID, ED - Abnormal; Notable for the following components:   Lactic Acid, Venous 2.0 (*)    All other components within normal limits  I-STAT VENOUS BLOOD GAS, ED - Abnormal; Notable for the following components:   pH, Ven 7.506 (*)    pCO2, Ven 28.0 (*)    pO2, Ven 31 (*)    Potassium 3.4 (*)    Calcium , Ion 1.09 (*)    HCT 34.0 (*)    Hemoglobin 11.6 (*)    All other components within normal limits     ECG: Ordered Personally reviewed and interpreted by me showing: HR : 59 Rhythm: Sinus rhythm Incomplete right bundle branch block Borderline  prolonged QT interva QTC 500    The recent clinical data is shown below. Vitals:   08/11/24 2000 08/11/24 2030 08/11/24 2130 08/11/24 2236  BP: (!) 147/78 130/78 (!) 150/74   Pulse: 69 (!) 58 (!) 55   Resp: 15 (!) 24 (!) 24   Temp:    97.9 F (36.6 C)  TempSrc:    Axillary  SpO2: 100% 100% 100%   Weight:      Height:          WBC     Component Value Date/Time   WBC 7.8 08/11/2024 1850   LYMPHSABS 1.3 08/11/2024 1850   MONOABS 0.9 08/11/2024 1850   EOSABS 0.0 08/11/2024 1850   BASOSABS 0.0 08/11/2024 1850    Lactic Acid, Venous    Component Value Date/Time   LATICACIDVEN 2.0 (HH) 08/11/2024 1905     Lactic Acid, Venous    Component Value Date/Time   LATICACIDVEN 1.1 08/11/2024 2243    Procalcitonin   Ordered      UA pyuria   Urine analysis:    Component Value Date/Time   COLORURINE AMBER (A)  08/11/2024 2148   APPEARANCEUR HAZY (A) 08/11/2024 2148   LABSPEC 1.027 08/11/2024 2148   PHURINE 5.0 08/11/2024 2148   GLUCOSEU NEGATIVE 08/11/2024 2148   HGBUR SMALL (A) 08/11/2024 2148   BILIRUBINUR NEGATIVE 08/11/2024 2148   KETONESUR 20 (A) 08/11/2024 2148   PROTEINUR 30 (A) 08/11/2024 2148   NITRITE POSITIVE (A) 08/11/2024 2148   LEUKOCYTESUR MODERATE (A) 08/11/2024 2148    Results for orders placed or performed during the hospital encounter of 08/09/24  Resp panel by RT-PCR (RSV, Flu A&B, Covid) Anterior Nasal Swab     Status: Abnormal   Collection Time: 08/09/24  9:50 AM   Specimen: Anterior Nasal Swab  Result Value Ref Range Status   SARS Coronavirus 2 by RT PCR NEGATIVE NEGATIVE Final         Influenza A by PCR POSITIVE (A) NEGATIVE Final   Influenza B by PCR NEGATIVE NEGATIVE Final         Resp Syncytial Virus by PCR NEGATIVE NEGATIVE Final          ABX started Antibiotics Given (last 72 hours)     None      __________________________________________________  Venous  Blood Gas result:  pH  7.506 High  Sodium 136 mmol/L   pCO2, Ven 28.0  Low  mmHg Potassium 3.4 Low  mmol/L  pO2, Ven 31 Low Panic  mmHg      ABG    Component Value Date/Time   PHART 7.408 07/16/2019 1357   PCO2ART 31.9 (L) 07/16/2019 1357   PO2ART 294.0 (H) 07/16/2019 1357   HCO3 22.1 08/11/2024 1904   TCO2 23 08/11/2024 1904   ACIDBASEDEF 4.0 (H) 07/16/2019 1357   O2SAT 68 08/11/2024 1904      __________________________________________________________ Recent Labs  Lab 08/09/24 0949 08/11/24 1850 08/11/24 1904  NA 138 136 136  K 3.7 3.4* 3.4*  CO2 25 21*  --   GLUCOSE 100* 150*  --   BUN 24* 24*  --   CREATININE 1.11 1.10  --   CALCIUM  9.2 9.4  --     Cr   stable,  Lab Results  Component Value Date   CREATININE 1.10 08/11/2024   CREATININE 1.11 08/09/2024   CREATININE 1.01 06/28/2024    Recent Labs  Lab 08/11/24 1850  AST 68*  ALT 36  ALKPHOS 144*  BILITOT 1.4*  PROT 6.9  ALBUMIN 4.1   Lab Results  Component Value Date   CALCIUM  9.4 08/11/2024   PHOS 4.6 08/01/2019       Plt: Lab Results  Component Value Date   PLT 68 (L) 08/11/2024       Recent Labs  Lab 08/09/24 0949 08/11/24 1850 08/11/24 1904  WBC 4.9 7.8  --   NEUTROABS  --  5.5  --   HGB 11.3* 11.8* 11.6*  HCT 31.7* 32.0* 34.0*  MCV 99.1 96.4  --   PLT 59* 68*  --     HG/HCT  stable,      Component Value Date/Time   HGB 11.6 (L) 08/11/2024 1904   HGB 11.2 (L) 06/28/2024 1015   HCT 34.0 (L) 08/11/2024 1904   MCV 96.4 08/11/2024 1850       _______________________________________________ Hospitalist was called for admission for   Influenza A, increased work of breathing, hypokalemia    The following Work up has been ordered so far:  Orders Placed This Encounter  Procedures   Culture, blood (Routine x 2)   Urine Culture   DG  Chest Port 1 View if patient is in a treatment room.   CT Angio Chest PE W and/or Wo Contrast   Comprehensive metabolic panel   CBC with Differential   Protime-INR   Urinalysis, w/ Reflex to Culture (Infection  Suspected) -Urine, Clean Catch   Pro Brain natriuretic peptide   Notify physician (specify)  Specify: Notify provider for possible Code Sepsis   Consult to hospitalist   Bipap   I-Stat Lactic Acid, ED   I-Stat venous blood gas, ([facility], [facility], [facility])   ED EKG     OTHER Significant initial  Findings:  labs showing:       Cultures:    Component Value Date/Time   SDES URINE, RANDOM 08/06/2020 1224   SPECREQUEST NONE 08/06/2020 1224   CULT (A) 08/06/2020 1224    <10,000 COLONIES/mL INSIGNIFICANT GROWTH Performed at [facility], [address], [place], [place] [address]    REPTSTATUS 08/07/2020 FINAL 08/06/2020 1224     Radiological Exams on Admission: CT Angio Chest PE W and/or Wo Contrast Result Date: 08/11/2024 EXAM: CTA CHEST 08/11/2024 09:15:59 PM TECHNIQUE: CTA of the chest was performed without and with the administration of 75 mL of iohexol  (OMNIPAQUE ) 350 MG/ML injection. Multiplanar reformatted images are provided for review. MIP images are provided for review. Automated exposure control, iterative reconstruction, and/or weight based adjustment of the mA/kV was utilized to reduce the radiation dose to as low as reasonably achievable. COMPARISON: None available. CLINICAL HISTORY: Pulmonary embolism (PE) suspected, high prob. FINDINGS: PULMONARY ARTERIES: Pulmonary arteries are adequately opacified for evaluation. No acute pulmonary embolus. Central pulmonary arteries are of normal caliber. MEDIASTINUM: Extensive multivessel coronary artery calcifications. Global cardiac size within normal limits. No pericardial effusion. Mild atherosclerotic calcification within the thoracic aorta. No aortic aneurysm. LYMPH NODES: No mediastinal, hilar or axillary lymphadenopathy. LUNGS AND PLEURA: 2 - 3 mm nodule within the left apex represents a small focus of peripheral mucoid impaction. 5 mm nodule within the right upper lobe (52 / 6) is stable since prior examination and is considered  benign. Peribronchial nodularity within the posterior segment of the right upper lobe is present with associated asymmetric bronchial wall thickening suggesting a small focus of infectious bronchiolitis and associated airway inflammation. No focal consolidation or pulmonary edema. No evidence of pleural effusion or pneumothorax. UPPER ABDOMEN: Large hiatal hernia. Status post cholecystectomy. At least small splenomegaly with a spleen not fully included on this examination. SOFT TISSUES AND BONES: Osseous structures are age appropriate. No acute bone abnormality. No lytic or blastic bone lesion. No acute soft tissue abnormality. IMPRESSION: 1. No pulmonary embolism. 2. Small focus of infectious bronchiolitis and associated airway inflammation in the posterior segment of the right upper lobe. 3. Large hiatal hernia. 4. At least mild splenomegaly with the spleen not fully included on this examination. 5. Extensive multivessel coronary artery calcifications. 6. Mild thoracic aortic atherosclerosis without aortic aneurysm. 7. Stable 5 mm right upper lobe nodule, benign. 8. RAF score - Aortic atherosclerosis (ICD10-I70.0). Electronically signed by: Dorethia Molt MD 08/11/2024 10:11 PM EST RP Workstation: [id number]   DG Chest Port 1 View if patient is in a treatment room. Result Date: 08/11/2024 EXAM: 1 VIEW(S) XRAY OF THE CHEST 08/11/2024 07:02:00 PM COMPARISON: 08/09/2024 CLINICAL HISTORY: Suspected Sepsis FINDINGS: LUNGS AND PLEURA: Elevated right hemidiaphragm. Calcified granuloma in right lung base, unchanged. There is likely an additional small calcified granuloma over the medial right upper lobe. Slightly low lung volumes. No pleural effusion. No pneumothorax. HEART  AND MEDIASTINUM: Retrocardiac opacity consistent with large hiatal hernia. Aortic atherosclerosis. BONES AND SOFT TISSUES: No acute osseous abnormality. IMPRESSION: 1. No acute findings. 2. Large hiatal hernia. Electronically signed by: Donnice Mania  MD 08/11/2024 07:51 PM EST RP Workstation: [id number]   _______________________________________________________________________________________________________ Latest  Blood pressure (!) 150/74, pulse (!) 55, temperature 97.9 F (36.6 C), temperature source Axillary, resp. rate (!) 24, height 5' 7 (1.702 m), weight 77.6 kg, SpO2 100%.   Vitals  labs and radiology finding personally reviewed  Review of Systems:    Pertinent positives include:   chills, fatigue,   shortness of breath at rest.  dyspnea on exertion, Constitutional:  No weight loss, night sweats, Fevers,weight loss  HEENT:  No headaches, Difficulty swallowing,Tooth/dental problems,Sore throat,  No sneezing, itching, ear ache, nasal congestion, post nasal drip,  Cardio-vascular:  No chest pain, Orthopnea, PND, anasarca, dizziness, palpitations.no Bilateral lower extremity swelling  GI:  No heartburn, indigestion, abdominal pain, nausea, vomiting, diarrhea, change in bowel habits, loss of appetite, melena, blood in stool, hematemesis Resp:  no No excess mucus, no productive cough, No non-productive cough, No coughing up of blood.No change in color of mucus.No wheezing. Skin:  no rash or lesions. No jaundice GU:  no dysuria, change in color of urine, no urgency or frequency. No straining to urinate.  No flank pain.  Musculoskeletal:  No joint pain or no joint swelling. No decreased range of motion. No back pain.  Psych:  No change in mood or affect. No depression or anxiety. No memory loss.  Neuro: no localizing neurological complaints, no tingling, no weakness, no double vision, no gait abnormality, no slurred speech, no confusion  All systems reviewed and apart from HOPI all are negative _______________________________________________________________________________________________ Past Medical History:   Past Medical History:  Diagnosis Date   Anxiety    CHF (congestive heart failure) (HCC)    not in last 2.5  months   Chronic back pain    Chronic low back pain 05/19/2017   Chronic, continuous use of opioids    for back pain   Coronary artery calcification seen on CT scan    Cystitis    Depression    Encephalopathy 07/14/2019   Gallstones    Goals of care, counseling/discussion 07/13/2020   IgG myeloma (HCC) 08/07/2021   MGUS (monoclonal gammopathy of unknown significance) 07/13/2020   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/2019   Nerve pain    Stage 3a chronic kidney disease (HCC) 08/06/2020   Thoracic aortic aneurysm       Past Surgical History:  Procedure Laterality Date   BACK SURGERY     09-2016   back surgey     CHOLECYSTECTOMY N/A 08/07/2020   Procedure: LAPAROSCOPIC CHOLECYSTECTOMY;  Surgeon: Signe Mitzie LABOR, MD;  Location: [facility] OR;  Service: General;  Laterality: N/A;   ENDOSCOPIC RETROGRADE CHOLANGIOPANCREATOGRAPHY (ERCP) WITH PROPOFOL  N/A 09/25/2018   Procedure: ENDOSCOPIC RETROGRADE CHOLANGIOPANCREATOGRAPHY (ERCP) WITH PROPOFOL ;  Surgeon: Saintclair Jasper, MD;  Location: [facility] ENDOSCOPY;  Service: Gastroenterology;  Laterality: N/A;   INSERTION OF SUPRAPUBIC CATHETER N/A 10/26/2019   Procedure: INSERTION OF SUPRAPUBIC CATHETER;  Surgeon: Watt Rush, MD;  Location: [facility] ORS;  Service: Urology;  Laterality: N/A;   PANCREATIC STENT PLACEMENT  09/25/2018   Procedure: PANCREATIC STENT PLACEMENT;  Surgeon: Saintclair Jasper, MD;  Location: [facility] ENDOSCOPY;  Service: Gastroenterology;;   REMOVAL OF STONES  09/25/2018   Procedure: REMOVAL OF STONES;  Surgeon: Saintclair Jasper, MD;  Location: [facility] ENDOSCOPY;  Service: Gastroenterology;;   ANNETT  09/25/2018  Procedure: SPHINCTEROTOMY;  Surgeon: Saintclair Jasper, MD;  Location: [facility] ENDOSCOPY;  Service: Gastroenterology;;   TRANSURETHRAL RESECTION OF PROSTATE N/A 10/26/2019   Procedure: TRANSURETHRAL RESECTION OF THE PROSTATE (TURP);  Surgeon: Watt Rush, MD;  Location: [facility] ORS;  Service: Urology;  Laterality: N/A;    Social History:  Ambulatory   cane, walker        reports that he quit smoking about 50 years ago. His smoking use included cigarettes. He started smoking about 70 years ago. He has a 20 pack-year smoking history. He quit smokeless tobacco use about 44 years ago. He reports that he does not drink alcohol and does not use drugs.   Family History:   Family History  Problem Relation Age of Onset   Cancer Mother    Heart attack Father    ______________________________________________________________________________________________ Allergies: Allergies[1]   Prior to Admission medications  Medication Sig Start Date End Date Taking? Authorizing Provider  acetaminophen  (TYLENOL ) 325 MG tablet Take 2 tablets (650 mg total) by mouth every 6 (six) hours as needed for mild pain (or temp > 100). 08/08/20   Sherrill Cable Latif, DO  allopurinol  (ZYLOPRIM ) 100 MG tablet TAKE 1 TABLET BY MOUTH EVERY DAY 03/16/19   Jenel Carlin POUR, MD  atorvastatin  (LIPITOR) 40 MG tablet Take 1 tablet (40 mg total) by mouth daily at 6 PM. 08/02/19   Swayze, Ava, DO  carvedilol  (COREG ) 6.25 MG tablet Take 1 tablet (6.25 mg total) by mouth 2 (two) times daily. 09/24/23   Court Dorn PARAS, MD  colchicine 0.6 MG tablet  07/18/20   [provider]  diphenhydramine -acetaminophen  (TYLENOL  PM) 25-500 MG TABS tablet Take 1 tablet by mouth at bedtime.    [provider]  ENULOSE 10 GM/15ML SOLN 10 g daily as needed. Patient not taking: Reported on 06/28/2024 04/14/24   [provider]  finasteride  (PROSCAR ) 5 MG tablet Take 1 tablet (5 mg total) by mouth daily. For urination 07/27/19   Alvaro Ricardo KATHEE Mickey., MD  furosemide  (LASIX ) 40 MG tablet TAKE 1 TABLET BY MOUTH EVERY DAY 06/27/23   Court Dorn PARAS, MD  HYDROcodone -acetaminophen  (NORCO) 7.5-325 MG tablet Take 1 tablet by mouth 4 (four) times daily as needed for moderate pain. 08/15/22   [provider]  lactulose (CHRONULAC) 10 GM/15ML solution Take 10 g by mouth daily as needed. 04/14/24    [provider]  LORazepam  (ATIVAN ) 0.5 MG tablet TAKE 1 TABLET (0.5 MG TOTAL) BY MOUTH EVERY 6 (SIX) HOURS AS NEEDED (NAUSEA OR VOMITING). 07/31/23   Timmy Riley SAUNDERS, MD  ondansetron  (ZOFRAN  ODT) 4 MG disintegrating tablet Take 1 tablet (4 mg total) by mouth every 8 (eight) hours as needed for nausea or vomiting. Patient not taking: Reported on 06/28/2024 08/03/20   Schuyler Charlie RAMAN, MD  oseltamivir  (TAMIFLU ) 75 MG capsule Take 1 capsule (75 mg total) by mouth every 12 (twelve) hours. 08/09/24   Curatolo, Adam, DO  pantoprazole  (PROTONIX ) 40 MG tablet Take 1 tablet (40 mg total) by mouth at bedtime. 08/02/19   Swayze, Ava, DO  polyethylene glycol (MIRALAX  / GLYCOLAX ) 17 g packet Take 17 g by mouth 2 (two) times a week.    [provider]  pregabalin  (LYRICA ) 150 MG capsule Take 150 mg by mouth 2 (two) times daily. 06/01/20   [provider]  traZODone  (DESYREL ) 50 MG tablet Take 50 mg by mouth at bedtime. 05/17/19   [provider]  vitamin B-12 (CYANOCOBALAMIN) 500 MCG tablet Take 500 mcg  by mouth daily.    [provider]    ___________________________________________________________________________________________________ Physical Exam:    08/11/2024    9:30 PM 08/11/2024    8:30 PM 08/11/2024    8:00 PM  Vitals with BMI  Systolic 150 130 852  Diastolic 74 78 78  Pulse 55 58 69     1. General:  in No  Acute distress   Chronically ill   2. Psychological: Alert and   Oriented 3. Head/ENT:  Dry Mucous Membranes                          Head Non traumatic, neck supple                          Poor Dentition 4. SKIN:  decreased Skin turgor,  Skin clean Dry and intact no rash    5. Heart: Regular rate and rhythm no  Murmur, no Rub or gallop 6. Lungs: occasional crackles   7. Abdomen: Soft,   tender with superficial, Non distended   obese  bowel sounds present 8. Lower extremities: no clubbing, cyanosis, no  edema 9. Neurologically  Grossly intact, moving all 4 extremities equally   10. MSK: Normal range of motion    Chart has been reviewed   ______________________________________________________________________________________________  Assessment/Plan  83 y.o. male with medical history significant of CAD, CKD 3B, IgG myeloma, MGUS, systolic CHF, GERD, HTN, gout, iron deficiency anemia, neuropathy, BPH, chronic back pain, HLD, and anxiety    Admitted for  Influenza A, increased work of breathing, hypokalemia   Present on Admission:  Influenza A  Prolonged QT interval  Stage 3a chronic kidney disease (HCC)  Pyuria  Thoracic aortic aneurysm  IgG myeloma (HCC)  Hypokalemia  Hyperlipidemia  Chronic systolic CHF (congestive heart failure) (HCC)  Tachypnea     Prolonged QT interval - will monitor on tele avoid QT prolonging medications, rehydrate correct electrolytes   Stage 3a chronic kidney disease (HCC)  -chronic avoid nephrotoxic medications such as NSAIDs, Vanco Zosyn  combo,  avoid hypotension, continue to follow renal function    Influenza A Continue tamiflu  Supportive management  Pyuria Patient denies dysuria but does have some abdominal discomfort. On Rocephin  for bronchitis will be able to cover urine obtain urine culture  Thoracic aortic aneurysm Chronic stable  IgG myeloma (HCC) Likely increases patient's risk for infectious complications.  Cover Rocephin  for bronchitis for now check for procalcitonin if unremarkable consider de-escalation  Hypokalemia Will replace and recheck  Hyperlipidemia Continue Lipitor 40 mg a day  Chronic systolic CHF (congestive heart failure) (HCC) Chronic stable avoid overaggressive fluid resuscitation.  Patient does appear to be somewhat dehydrated hold Lasix  for tonight  Tachypnea No hypoxia but given increased work of breathing patient was started on BiPAP Will continue for now    Other plan as per orders.  DVT prophylaxis:  SCD     Code  Status:  DNR/DNI  as per patient   I had personally discussed CODE STATUS with patient and family  ACP   none     Family Communication:   Family  at  Bedside  plan of care was discussed    with  Daughter,   Diet  heart healthy   Disposition Plan:         To home once workup is complete and patient is stable   Following barriers for discharge:  Electrolytes corrected                               medications                               Afebrile, white count improving able to transition to PO antibiotics                                                        Work of breathing improves       Consult Orders  (From admission, onward)           Start     Ordered   08/11/24 2228  Consult to hospitalist  Paged by Tedd  Once       Provider:  (Not yet assigned)  Question Answer Comment  Place call to: [facility]   Reason for Consult Admit      08/11/24 2227                               Would benefit from PT/OT eval prior to DC  Ordered                                      Consults called:    NONE   Admission status:  ED Disposition     ED Disposition  Admit   Condition  --   Comment  Hospital Area: [facility] [[id number]]  Level of Care: Progressive [102]  Admit to Progressive based on following criteria: RESPIRATORY PROBLEMS hypoxemic/hypercapnic respiratory failure that is responsive to NIPPV (BiPAP) or High Flow Nasal Cannula (6-80 lpm). Frequent assessment/intervention, no > Q2 hrs < Q4 hrs, to maintain oxygenation and pulmonary hygiene.  May admit patient to [facility] or [facility] if equivalent level of care is available:: No  Diagnosis: Influenza A [723124]  Admitting Physician: Maguire Killmer [[id number]]  Attending Physician: Lyly Canizales [[id number]]  Certification:: I certify this patient will need inpatient services for at least 2 midnights  Expected Medical  Readiness: 08/14/2024            inpatient     I Expect 2 midnight stay secondary to severity of patient's current illness need for inpatient interventions justified by the following:  hemodynamic instability despite optimal treatment (  tachypnea   )   Severe lab/radiological/exam abnormalities including:    Influenza A     and extensive comorbidities including: CHF  CAD CKD   That are currently affecting medical management.   I expect  patient to be hospitalized for 2 midnights requiring inpatient medical care.  Patient is at high risk for adverse outcome (such as loss of life or disability) if not treated.  Indication for inpatient stay as follows:   inability to maintain oral hydration    Need  need for biPAP Frequent labs    Level of care        progressive     Donika Butner 08/11/2024, 11:53 PM    [facility]     after 2 AM please page  floor coverage   If 7AM-7PM, please contact the day team taking care of the patient using [email or web address]        [1] No Known Allergies  "

## 2024-08-11 NOTE — ED Triage Notes (Signed)
 Pt via EMS from home c/o nausea. EMS notes recent flu dx with over a week of symptoms, worsened today, with reported SOB. Bilateral rhonchi and wheezing on the right side on site, tachypnea with RR 30-40 and etco2 12. Received duoneb en route, 5mg  albuterol  and 0.5 atrovent. 4mg  zofran , 125mg  solumedrol via 20ga in right AC. A/O x 4.   HR 80-90 T 98.1 O2 98% RA BP 126/72 CBG 112

## 2024-08-11 NOTE — Subjective & Objective (Signed)
 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

## 2024-08-11 NOTE — Assessment & Plan Note (Signed)
 No hypoxia but given increased work of breathing patient was started on BiPAP Will continue for now

## 2024-08-11 NOTE — ED Provider Notes (Signed)
 " [place] EMERGENCY DEPARTMENT AT [facility] Provider Note   CSN: [id number] Arrival date & time: 08/11/24  8182     Patient presents with: Shortness of Breath and Nausea   Riley Eaton is a 83 y.o. male with past medical history of dilated cardiomyopathy, NSTEMI, HLD, CKD stage III, systolic CHF presents to Emergency Department via EMS for evaluation of nausea, shortness of breath that has been going on for past week.  Shortness of breath worsened today.  Was originally evaluated on 08/09/2024 and tested positive for influenza.  Has been taking Tamiflu  at home without improvement.  Was provided DuoNeb, Solu-Medrol, Zofran  prior to arrival without improvement of shortness of breath.  Denies chest pain, fevers.  Upon my interview, patient's respiratory rate is 30-40 with CO2 of 12.    Shortness of Breath      Prior to Admission medications  Medication Sig Start Date End Date Taking? Authorizing Provider  acetaminophen  (TYLENOL ) 325 MG tablet Take 2 tablets (650 mg total) by mouth every 6 (six) hours as needed for mild pain (or temp > 100). 08/08/20   Sherrill Cable Latif, DO  allopurinol  (ZYLOPRIM ) 100 MG tablet TAKE 1 TABLET BY MOUTH EVERY DAY 03/16/19   Jenel Carlin POUR, MD  atorvastatin  (LIPITOR) 40 MG tablet Take 1 tablet (40 mg total) by mouth daily at 6 PM. 08/02/19   Swayze, Ava, DO  carvedilol  (COREG ) 6.25 MG tablet Take 1 tablet (6.25 mg total) by mouth 2 (two) times daily. 09/24/23   Court Dorn PARAS, MD  colchicine 0.6 MG tablet  07/18/20   [provider]  diphenhydramine -acetaminophen  (TYLENOL  PM) 25-500 MG TABS tablet Take 1 tablet by mouth at bedtime.    [provider]  ENULOSE 10 GM/15ML SOLN 10 g daily as needed. Patient not taking: Reported on 06/28/2024 04/14/24   [provider]  finasteride  (PROSCAR ) 5 MG tablet Take 1 tablet (5 mg total) by mouth daily. For urination 07/27/19   Alvaro Ricardo KATHEE Mickey., MD  furosemide  (LASIX )  40 MG tablet TAKE 1 TABLET BY MOUTH EVERY DAY 06/27/23   Court Dorn PARAS, MD  HYDROcodone -acetaminophen  (NORCO) 7.5-325 MG tablet Take 1 tablet by mouth 4 (four) times daily as needed for moderate pain. 08/15/22   [provider]  lactulose (CHRONULAC) 10 GM/15ML solution Take 10 g by mouth daily as needed. 04/14/24   [provider]  LORazepam  (ATIVAN ) 0.5 MG tablet TAKE 1 TABLET (0.5 MG TOTAL) BY MOUTH EVERY 6 (SIX) HOURS AS NEEDED (NAUSEA OR VOMITING). 07/31/23   Timmy Maude SAUNDERS, MD  ondansetron  (ZOFRAN  ODT) 4 MG disintegrating tablet Take 1 tablet (4 mg total) by mouth every 8 (eight) hours as needed for nausea or vomiting. Patient not taking: Reported on 06/28/2024 08/03/20   Schuyler Charlie RAMAN, MD  oseltamivir  (TAMIFLU ) 75 MG capsule Take 1 capsule (75 mg total) by mouth every 12 (twelve) hours. 08/09/24   Curatolo, Adam, DO  pantoprazole  (PROTONIX ) 40 MG tablet Take 1 tablet (40 mg total) by mouth at bedtime. 08/02/19   Swayze, Ava, DO  polyethylene glycol (MIRALAX  / GLYCOLAX ) 17 g packet Take 17 g by mouth 2 (two) times a week.    [provider]  pregabalin  (LYRICA ) 150 MG capsule Take 150 mg by mouth 2 (two) times daily. 06/01/20   [provider]  traZODone  (DESYREL ) 50 MG tablet Take 50 mg by mouth at bedtime. 05/17/19   [provider]  vitamin B-12 (CYANOCOBALAMIN) 500 MCG tablet  Take 500 mcg by mouth daily.    [provider]    Allergies: Patient has no known allergies.    Review of Systems  Respiratory:  Positive for shortness of breath.     Updated Vital Signs BP (!) 156/76   Pulse (!) 56   Temp 97.9 F (36.6 C) (Axillary)   Resp (!) 23   Ht 5' 7 (1.702 m)   Wt 77.6 kg   SpO2 100%   BMI 26.78 kg/m   Physical Exam Vitals and nursing note reviewed.  Constitutional:      General: He is not in acute distress.    Appearance: Normal appearance.  HENT:     Head: Normocephalic and atraumatic.  Eyes:      Conjunctiva/sclera: Conjunctivae normal.  Cardiovascular:     Rate and Rhythm: Normal rate.  Pulmonary:     Effort: Pulmonary effort is normal. No respiratory distress.     Breath sounds: Decreased breath sounds present.     Comments: Diffuse crackles in all lobes. Tachypnea. Increased work of breathing. Maintaining o2 sats wo supplementation Musculoskeletal:     Right lower leg: No tenderness. No edema.     Left lower leg: No tenderness. No edema.  Skin:    Coloration: Skin is not jaundiced or pale.  Neurological:     Mental Status: He is alert and oriented to person, place, and time. Mental status is at baseline.     (all labs ordered are listed, but only abnormal results are displayed) Labs Reviewed  COMPREHENSIVE METABOLIC PANEL WITH GFR - Abnormal; Notable for the following components:      Result Value   Potassium 3.4 (*)    CO2 21 (*)    Glucose, Bld 150 (*)    BUN 24 (*)    AST 68 (*)    Alkaline Phosphatase 144 (*)    Total Bilirubin 1.4 (*)    Anion gap 16 (*)    All other components within normal limits  CBC WITH DIFFERENTIAL/PLATELET - Abnormal; Notable for the following components:   RBC 3.32 (*)    Hemoglobin 11.8 (*)    HCT 32.0 (*)    MCH 35.5 (*)    MCHC 36.9 (*)    Platelets 68 (*)    Abs Immature Granulocytes 0.13 (*)    All other components within normal limits  URINALYSIS, W/ REFLEX TO CULTURE (INFECTION SUSPECTED) - Abnormal; Notable for the following components:   Color, Urine AMBER (*)    APPearance HAZY (*)    Hgb urine dipstick SMALL (*)    Ketones, ur 20 (*)    Protein, ur 30 (*)    Nitrite POSITIVE (*)    Leukocytes,Ua MODERATE (*)    Bacteria, UA FEW (*)    All other components within normal limits  PRO BRAIN NATRIURETIC PEPTIDE - Abnormal; Notable for the following components:   Pro Brain Natriuretic Peptide 632.0 (*)    All other components within normal limits  I-STAT CG4 LACTIC ACID, ED - Abnormal; Notable for the following  components:   Lactic Acid, Venous 2.0 (*)    All other components within normal limits  I-STAT VENOUS BLOOD GAS, ED - Abnormal; Notable for the following components:   pH, Ven 7.506 (*)    pCO2, Ven 28.0 (*)    pO2, Ven 31 (*)    Potassium 3.4 (*)    Calcium , Ion 1.09 (*)    HCT 34.0 (*)    Hemoglobin 11.6 (*)  All other components within normal limits  CULTURE, BLOOD (ROUTINE X 2)  CULTURE, BLOOD (ROUTINE X 2)  URINE CULTURE  PROTIME-INR  CK  MAGNESIUM   PHOSPHORUS  PROCALCITONIN  I-STAT CG4 LACTIC ACID, ED    EKG: None  Radiology: CT Angio Chest PE W and/or Wo Contrast Result Date: 08/11/2024 EXAM: CTA CHEST 08/11/2024 09:15:59 PM TECHNIQUE: CTA of the chest was performed without and with the administration of 75 mL of iohexol  (OMNIPAQUE ) 350 MG/ML injection. Multiplanar reformatted images are provided for review. MIP images are provided for review. Automated exposure control, iterative reconstruction, and/or weight based adjustment of the mA/kV was utilized to reduce the radiation dose to as low as reasonably achievable. COMPARISON: None available. CLINICAL HISTORY: Pulmonary embolism (PE) suspected, high prob. FINDINGS: PULMONARY ARTERIES: Pulmonary arteries are adequately opacified for evaluation. No acute pulmonary embolus. Central pulmonary arteries are of normal caliber. MEDIASTINUM: Extensive multivessel coronary artery calcifications. Global cardiac size within normal limits. No pericardial effusion. Mild atherosclerotic calcification within the thoracic aorta. No aortic aneurysm. LYMPH NODES: No mediastinal, hilar or axillary lymphadenopathy. LUNGS AND PLEURA: 2 - 3 mm nodule within the left apex represents a small focus of peripheral mucoid impaction. 5 mm nodule within the right upper lobe (52 / 6) is stable since prior examination and is considered benign. Peribronchial nodularity within the posterior segment of the right upper lobe is present with associated asymmetric  bronchial wall thickening suggesting a small focus of infectious bronchiolitis and associated airway inflammation. No focal consolidation or pulmonary edema. No evidence of pleural effusion or pneumothorax. UPPER ABDOMEN: Large hiatal hernia. Status post cholecystectomy. At least small splenomegaly with a spleen not fully included on this examination. SOFT TISSUES AND BONES: Osseous structures are age appropriate. No acute bone abnormality. No lytic or blastic bone lesion. No acute soft tissue abnormality. IMPRESSION: 1. No pulmonary embolism. 2. Small focus of infectious bronchiolitis and associated airway inflammation in the posterior segment of the right upper lobe. 3. Large hiatal hernia. 4. At least mild splenomegaly with the spleen not fully included on this examination. 5. Extensive multivessel coronary artery calcifications. 6. Mild thoracic aortic atherosclerosis without aortic aneurysm. 7. Stable 5 mm right upper lobe nodule, benign. 8. RAF score - Aortic atherosclerosis (ICD10-I70.0). Electronically signed by: Dorethia Molt MD 08/11/2024 10:11 PM EST RP Workstation: [id number]   DG Chest Port 1 View if patient is in a treatment room. Result Date: 08/11/2024 EXAM: 1 VIEW(S) XRAY OF THE CHEST 08/11/2024 07:02:00 PM COMPARISON: 08/09/2024 CLINICAL HISTORY: Suspected Sepsis FINDINGS: LUNGS AND PLEURA: Elevated right hemidiaphragm. Calcified granuloma in right lung base, unchanged. There is likely an additional small calcified granuloma over the medial right upper lobe. Slightly low lung volumes. No pleural effusion. No pneumothorax. HEART AND MEDIASTINUM: Retrocardiac opacity consistent with large hiatal hernia. Aortic atherosclerosis. BONES AND SOFT TISSUES: No acute osseous abnormality. IMPRESSION: 1. No acute findings. 2. Large hiatal hernia. Electronically signed by: Donnice Mania MD 08/11/2024 07:51 PM EST RP Workstation: [id number]     Medications Ordered in the ED  potassium chloride  10 mEq in  100 mL IVPB (10 mEq Intravenous New Bag/Given 08/11/24 2322)  cefTRIAXone  (ROCEPHIN ) 1 g in sodium chloride  0.9 % 100 mL IVPB (1 g Intravenous New Bag/Given 08/11/24 2318)  iohexol  (OMNIPAQUE ) 350 MG/ML injection 75 mL (75 mLs Intravenous Contrast Given 08/11/24 2116)    Clinical Course as of 08/11/24 2333  Wed Aug 11, 2024  2220 Anion gap(!): 16 No leukocytosis, fever, nor tachycardia. No hyperglycemia nor signs  of DKA [LB]    Clinical Course User Index [LB] Minnie Tinnie BRAVO, PA                                 Medical Decision Making Amount and/or Complexity of Data Reviewed Labs: ordered. Decision-making details documented in ED Course. Radiology: ordered.  Risk Prescription drug management. Decision regarding hospitalization.   Patient presents to the ED for concern of show of breath, nausea, cough, this involves an extensive number of treatment options, and is a complaint that carries with it a high risk of complications and morbidity.  The differential diagnosis includes COVID, flu, RSV, PE, fluid overload, COPD exacerbation, ACS, pneumonia   Co morbidities that complicate the patient evaluation  See HPI   Additional history obtained:  Additional history obtained from Nursing and Outside Medical Records   External records from outside source obtained and reviewed including triage note, ED note from 08/09/2024   Lab Tests:  I Ordered, and personally interpreted labs.  The pertinent results include:   Potassium 3.4 CBG 150 AST 68 ALP 144 total bili 1.4 AG 16 BNP 632 Hgb 11.8 Plt 68 UA with positive nitrates, moderate leukocytes, WBC, few bacteria pH 7.5 CO2 28 No leukocytosis lactic acid WNL   Imaging Studies ordered:  I ordered imaging studies including chest x-ray, CT PE I independently visualized and interpreted imaging which showed  No pulmonary embolism. Small focus of infectious bronchiolitis and associated airway inflammation in the posterior  segment of the right upper lobe See individual report for specific impression I agree with the radiologist interpretation   Cardiac Monitoring:  The patient was maintained on a cardiac monitor.  I personally viewed and interpreted the cardiac monitored which showed an underlying rhythm of: Sinus rhythm with no ischemic changes.  Borderline prolonged QT    Critical Interventions:  Respiratory failure requiring BiPAP   Consultations Obtained:  I requested consultation with the hospitalist,  and discussed lab and imaging findings as well as pertinent plan -Dr. Silvester accepts patient for admission  .Critical Care  Performed by: Minnie Tinnie BRAVO, PA Authorized by: Minnie Tinnie BRAVO, PA   Critical care provider statement:    Critical care time (minutes):  30   Critical care was necessary to treat or prevent imminent or life-threatening deterioration of the following conditions:  Respiratory failure (BiPaP)   Critical care was time spent personally by me on the following activities:  Development of treatment plan with patient or surrogate, discussions with consultants, evaluation of patient's response to treatment, examination of patient, ordering and review of laboratory studies, ordering and review of radiographic studies, ordering and performing treatments and interventions, pulse oximetry, re-evaluation of patient's condition and review of old charts   Care discussed with: admitting provider      Problem List / ED Course:  SHOB Tachypnea and increased work of breathing. No hypoxia - placed patient on bipap with improvement VBG without hypercarbia.  pH 7.5 BNP 632.  Age-adjusted BNP does not indicate fluid overload.  Does not appear fluid overloaded on exam Was provided DuoNeb, Solu-Medrol en route by EMS without improvement.  Do not hear any wheezing on exam.  Do not think additional DuoNeb is indicated CT PE without PE.  Does show small foci of bronchiolitis which is likely of  viral origin and not requiring abx No complaints of chest pain.  EKG without ischemic changes. Low suspicion for ACS  Influenza  No fever or tachycardia Tested + on 08/09/24 Has been on tamilfu for past two days wo improvement No leukocytosis nor elevated lactic acid BC pending  UTI Covered with Rocephin    Reevaluation:  After the interventions noted above, I reevaluated the patient and found that they have :improved    Dispostion:  After consideration of the diagnostic results and the patients response to treatment, I feel that the patent would benefit from admission for resp failure, PNA, influenza.   Discussed ED workup, disposition with patient expressed understand with the plan.  All questions answered to his satisfaction.  Final diagnoses:  Influenza  Acute respiratory failure, unspecified whether with hypoxia or hypercapnia [facility])    ED Discharge Orders     None        Minnie Tinnie BRAVO, [place] 08/11/24 2334  "

## 2024-08-11 NOTE — Assessment & Plan Note (Signed)
 Likely increases patient's risk for infectious complications.  Cover Rocephin  for bronchitis for now check for procalcitonin if unremarkable consider de-escalation

## 2024-08-11 NOTE — Progress Notes (Signed)
 Transported patient to CT and back to rm 30 without event.

## 2024-08-11 NOTE — Assessment & Plan Note (Signed)
-  chronic avoid nephrotoxic medications such as NSAIDs, Vanco Zosyn combo,  avoid hypotension, continue to follow renal function

## 2024-08-11 NOTE — Assessment & Plan Note (Signed)
Will replace and recheck 

## 2024-08-11 NOTE — Assessment & Plan Note (Signed)
Continue Lipitor 40 mg a day

## 2024-08-11 NOTE — Assessment & Plan Note (Addendum)
 Continue tamiflu  Supportive management

## 2024-08-11 NOTE — ED Notes (Signed)
 Called CCMD at [phone] to initiate cardiac monitoring as ordered.

## 2024-08-11 NOTE — Assessment & Plan Note (Signed)
 Patient denies dysuria but does have some abdominal discomfort. On Rocephin  for bronchitis will be able to cover urine obtain urine culture

## 2024-08-11 NOTE — ED Notes (Signed)
 Called RT to request bipap support

## 2024-08-11 NOTE — Assessment & Plan Note (Signed)
 Chronic stable

## 2024-08-12 LAB — MAGNESIUM: Magnesium: 2 mg/dL (ref 1.7–2.4)

## 2024-08-12 LAB — BASIC METABOLIC PANEL WITH GFR
Anion gap: 12 (ref 5–15)
BUN: 22 mg/dL (ref 8–23)
CO2: 20 mmol/L — ABNORMAL LOW (ref 22–32)
Calcium: 8.5 mg/dL — ABNORMAL LOW (ref 8.9–10.3)
Chloride: 106 mmol/L (ref 98–111)
Creatinine, Ser: 0.93 mg/dL (ref 0.61–1.24)
GFR, Estimated: >60 mL/min
Glucose, Bld: 108 mg/dL — ABNORMAL HIGH (ref 70–99)
Potassium: 3.4 mmol/L — ABNORMAL LOW (ref 3.5–5.1)
Sodium: 138 mmol/L (ref 135–145)

## 2024-08-12 LAB — PHOSPHORUS: Phosphorus: 1.9 mg/dL — ABNORMAL LOW (ref 2.5–4.6)

## 2024-08-12 LAB — CK: Total CK: 156 U/L (ref 49–397)

## 2024-08-12 LAB — PROCALCITONIN: Procalcitonin: 0.24 ng/mL

## 2024-08-12 LAB — MRSA NEXT GEN BY PCR, NASAL: MRSA by PCR Next Gen: NOT DETECTED

## 2024-08-12 MED ORDER — ALBUTEROL SULFATE (2.5 MG/3ML) 0.083% IN NEBU: 2.5000 mg | INHALATION_SOLUTION | RESPIRATORY_TRACT | Status: DC | PRN

## 2024-08-12 MED ORDER — PANTOPRAZOLE SODIUM 40 MG PO TBEC
40.0000 mg | DELAYED_RELEASE_TABLET | Freq: Every day | ORAL | Status: DC
  Administered 2024-08-12: 40 mg via ORAL
  Filled 2024-08-12: qty 1

## 2024-08-12 MED ORDER — SODIUM CHLORIDE 0.9 % IV SOLN: INTRAVENOUS | Status: AC

## 2024-08-12 MED ORDER — ACETAMINOPHEN 650 MG RE SUPP: 650.0000 mg | Freq: Four times a day (QID) | RECTAL | Status: DC | PRN

## 2024-08-12 MED ORDER — ACETAMINOPHEN 325 MG PO TABS: 650.0000 mg | ORAL_TABLET | Freq: Four times a day (QID) | ORAL | Status: DC | PRN

## 2024-08-12 MED ORDER — GUAIFENESIN ER 600 MG PO TB12
600.0000 mg | ORAL_TABLET | Freq: Two times a day (BID) | ORAL | Status: DC
  Administered 2024-08-12 – 2024-08-13 (×3): 600 mg via ORAL
  Filled 2024-08-12 (×3): qty 1

## 2024-08-12 MED ORDER — CARVEDILOL 6.25 MG PO TABS
6.2500 mg | ORAL_TABLET | Freq: Two times a day (BID) | ORAL | Status: DC
  Administered 2024-08-12 – 2024-08-13 (×2): 6.25 mg via ORAL
  Filled 2024-08-12 (×2): qty 1
  Filled 2024-08-12: qty 2

## 2024-08-12 MED ORDER — PREGABALIN 75 MG PO CAPS
150.0000 mg | ORAL_CAPSULE | Freq: Two times a day (BID) | ORAL | Status: DC
  Administered 2024-08-12 – 2024-08-13 (×3): 150 mg via ORAL
  Filled 2024-08-12 (×3): qty 2

## 2024-08-12 MED ORDER — TRAZODONE HCL 50 MG PO TABS
50.0000 mg | ORAL_TABLET | Freq: Every day | ORAL | Status: DC
  Administered 2024-08-12: 50 mg via ORAL
  Filled 2024-08-12: qty 1

## 2024-08-12 MED ORDER — POTASSIUM CHLORIDE CRYS ER 20 MEQ PO TBCR
40.0000 meq | EXTENDED_RELEASE_TABLET | Freq: Once | ORAL | Status: AC
  Administered 2024-08-12: 40 meq via ORAL
  Filled 2024-08-12: qty 2

## 2024-08-12 MED ORDER — ATORVASTATIN CALCIUM 40 MG PO TABS
40.0000 mg | ORAL_TABLET | Freq: Every day | ORAL | Status: DC
  Administered 2024-08-12: 40 mg via ORAL
  Filled 2024-08-12: qty 1

## 2024-08-12 MED ORDER — HYDROCODONE-ACETAMINOPHEN 7.5-325 MG PO TABS
1.0000 | ORAL_TABLET | Freq: Four times a day (QID) | ORAL | Status: DC | PRN
  Administered 2024-08-12 – 2024-08-13 (×2): 1 via ORAL
  Filled 2024-08-12 (×2): qty 1

## 2024-08-12 MED ORDER — POTASSIUM PHOSPHATES 15 MMOLE/5ML IV SOLN
15.0000 mmol | Freq: Once | INTRAVENOUS | Status: AC
  Administered 2024-08-12: 15 mmol via INTRAVENOUS
  Filled 2024-08-12: qty 5

## 2024-08-12 MED ORDER — OSELTAMIVIR PHOSPHATE 30 MG PO CAPS
30.0000 mg | ORAL_CAPSULE | Freq: Two times a day (BID) | ORAL | Status: DC
  Administered 2024-08-13 (×2): 30 mg via ORAL
  Filled 2024-08-12 (×3): qty 1

## 2024-08-12 MED ORDER — FENTANYL CITRATE (PF) 50 MCG/ML IJ SOSY
12.5000 ug | PREFILLED_SYRINGE | INTRAMUSCULAR | Status: DC | PRN
  Administered 2024-08-13: 50 ug via INTRAVENOUS
  Filled 2024-08-12: qty 1

## 2024-08-12 MED ORDER — FINASTERIDE 5 MG PO TABS
5.0000 mg | ORAL_TABLET | Freq: Every day | ORAL | Status: DC
  Administered 2024-08-12 – 2024-08-13 (×2): 5 mg via ORAL
  Filled 2024-08-12 (×2): qty 1

## 2024-08-12 MED ORDER — OSELTAMIVIR PHOSPHATE 75 MG PO CAPS: 75.0000 mg | ORAL_CAPSULE | Freq: Two times a day (BID) | ORAL | Status: DC

## 2024-08-12 MED ORDER — PROCHLORPERAZINE EDISYLATE 10 MG/2ML IJ SOLN
10.0000 mg | Freq: Four times a day (QID) | INTRAMUSCULAR | Status: DC | PRN
  Administered 2024-08-12: 10 mg via INTRAVENOUS
  Filled 2024-08-12: qty 2

## 2024-08-12 MED ORDER — OSELTAMIVIR PHOSPHATE 75 MG PO CAPS: 75.0000 mg | ORAL_CAPSULE | Freq: Once | ORAL | Status: DC

## 2024-08-12 NOTE — Progress Notes (Incomplete)
 Pt arrived on unit from ED. Alert and oriented X4 on arrival. Reported 8/10 back pain. Will get Pt's PRN norco. CCMD

## 2024-08-12 NOTE — Evaluation (Signed)
 Physical Therapy Evaluation Patient Details Name: Riley Eaton MRN: [id number] DOB: [birth year] Today's Date: 08/12/2024  History of Present Illness  Riley Eaton is a 84 y.o. male who presents on 08/11/24 with nausea and increased WOB, has flu A. PMH: MGUS; CAD; CKD 3B, IDA, neuropathy, HLD, thoracic aortic aneurysm; CHF; BPH s/p TURP; and chronic pain and anxiety  Clinical Impression  Pt admitted with above diagnosis. Lives at home alone and is independent at baseline, still drives, her nearby family support. Pt arrived with N & V, cough, and SOB. Has improved much through the day today. COntinues to cough but nausea has subsided. Pt remained stable with mobility with HR in 70's and SPO2 in 90's on RA. Pt ambulated 25' with RW and supervision. Will follow acutely but do not anticipate pt needing PT at d/c. Educated on activity progression once he goes home.  Pt currently with functional limitations due to the deficits listed below (see PT Problem List). Pt will benefit from acute skilled PT to increase their independence and safety with mobility to allow discharge.           If plan is discharge home, recommend the following: Assistance with cooking/housework;Help with stairs or ramp for entrance   Can travel by private vehicle        Equipment Recommendations None recommended by PT  Recommendations for Other Services       Functional Status Assessment Patient has had a recent decline in their functional status and demonstrates the ability to make significant improvements in function in a reasonable and predictable amount of time.     Precautions / Restrictions Precautions Precautions: Other (comment) Recall of Precautions/Restrictions: Intact Precaution/Restrictions Comments: watch O2 sats Restrictions Weight Bearing Restrictions Per Provider Order: No      Mobility  Bed Mobility Overal bed mobility: Modified Independent             General bed mobility  comments: pt able to get in and out of bed without assist    Transfers Overall transfer level: Modified independent Equipment used: Rolling walker (2 wheels), None               General transfer comment: stands with and without UE support without LOB    Ambulation/Gait Ambulation/Gait assistance: Supervision Gait Distance (Feet): 25 Feet Assistive device: Rolling walker (2 wheels) Gait Pattern/deviations: Trunk flexed, Decreased stride length Gait velocity: decreased Gait velocity interpretation: <1.8 ft/sec, indicate of risk for recurrent falls   General Gait Details: pt with trunk flexion and thus decreased stride length due to chronic back issues and now scoliosis. No LOB with RW support, safely makes turns and navigates obstacles in room  Stairs            Wheelchair Mobility     Tilt Bed    Modified Rankin (Stroke Patients Only)       Balance Overall balance assessment: Mild deficits observed, not formally tested                                           Pertinent Vitals/Pain Pain Assessment Pain Assessment: Faces Faces Pain Scale: Hurts little more Pain Location: back Pain Descriptors / Indicators: Constant Pain Intervention(s): Limited activity within patient's tolerance, Monitored during session    Home Living Family/patient expects to be discharged to:: Private residence Living Arrangements: Alone Available Help at Discharge: Family;Available PRN/intermittently Type  of Home: House Home Access: Stairs to enter Entrance Stairs-Rails: Right Entrance Stairs-Number of Steps: 5   Home Layout: One level Home Equipment: Rollator (4 wheels);Shower seat;Cane - single point Additional Comments: son lives less than 5 mins away, lots of other family support too. Family brings food over.    Prior Function Prior Level of Function : Independent/Modified Independent;Driving             Mobility Comments: mobility limited by chronic  back pain but pt still drives and mobilizes as tolerated ADLs Comments: independent     Extremity/Trunk Assessment   Upper Extremity Assessment Upper Extremity Assessment: Overall WFL for tasks assessed    Lower Extremity Assessment Lower Extremity Assessment: Overall WFL for tasks assessed    Cervical / Trunk Assessment Cervical / Trunk Assessment: Kyphotic;Back Surgery  Communication   Communication Communication: Impaired Factors Affecting Communication: Hearing impaired    Cognition Arousal: Alert Behavior During Therapy: WFL for tasks assessed/performed   PT - Cognitive impairments: No apparent impairments                         Following commands: Intact       Cueing Cueing Techniques: Verbal cues     General Comments General comments (skin integrity, edema, etc.): HR in 70's with ambulation, SPO2 remained in 90's on RA. Pt with noted non productive cough    Exercises     Assessment/Plan    PT Assessment Patient needs continued PT services  PT Problem List Decreased activity tolerance;Decreased mobility;Cardiopulmonary status limiting activity       PT Treatment Interventions DME instruction;Gait training;Stair training;Functional mobility training;Therapeutic activities;Therapeutic exercise;Patient/family education;Balance training    PT Goals (Current goals can be found in the Care Plan section)  Acute Rehab PT Goals Patient Stated Goal: return home PT Goal Formulation: With patient Time For Goal Achievement: 08/26/24 Potential to Achieve Goals: Good    Frequency Min 2X/week     Co-evaluation               AM-PAC PT 6 Clicks Mobility  Outcome Measure Help needed turning from your back to your side while in a flat bed without using bedrails?: None Help needed moving from lying on your back to sitting on the side of a flat bed without using bedrails?: None Help needed moving to and from a bed to a chair (including a  wheelchair)?: None Help needed standing up from a chair using your arms (e.g., wheelchair or bedside chair)?: A Little Help needed to walk in hospital room?: A Little Help needed climbing 3-5 steps with a railing? : A Little 6 Click Score: 21    End of Session Equipment Utilized During Treatment: Gait belt Activity Tolerance: Patient tolerated treatment well Patient left: in bed;with call bell/phone within reach;with family/visitor present Nurse Communication: Mobility status PT Visit Diagnosis: Difficulty in walking, not elsewhere classified (R26.2)    Time: 8586-8567 PT Time Calculation (min) (ACUTE ONLY): 19 min   Charges:   PT Evaluation $PT Eval Moderate Complexity: 1 Mod   PT General Charges $$ ACUTE PT VISIT: 1 Visit         Richerd Lipoma, PT  Acute Rehab Services Secure chat preferred Office [phone]   Richerd CROME Kaiyu Mirabal 08/12/2024, 2:44 PM

## 2024-08-12 NOTE — Progress Notes (Signed)
 "  PROGRESS NOTE    Riley Eaton  FMW:[id number] DOB: 08-Dec-1940 DOA: 08/11/2024 PCP: Debrah Josette ORN., PA-C   Brief Narrative: Riley Eaton is a 84 y.o. male with a history of CAD, CKD stage IIIb, IgG myeloma, MGUS, heart failure, GERD, hyperlipidemia.  Patient presented secondary to shortness of breath and tachypnea with known influenza infection. Patient managed supportively with continued Tamiflu  and addition of BiPAP.   Assessment/Plan:  Influenza A infection Diagnosed on 08/09/24 and started on Tamiflu . Afebrile. - Continue Tamiflu   Tachypnea Noted. Likely related to acute illness. Patient was placed on BiPAP for supportive care. CTA chest negative for acute PE. BiPAP discontinued this morning.  Pyuria No symptoms at this time.  History of thoracic aortic aneurysm Noted. Not seen on CTA chest this admission.  History of IgG myeloma Noted.  Hypokalemia Mild. - Potassium supplementation  Hyperlipidemia - Continue Lipitor  Chronic HFrecEF Last LVEF of 60-65% from July 2025. Stable.   DVT prophylaxis: SCDs Code Status:   Code Status: Limited: Do not attempt resuscitation (DNR) -DNR-LIMITED -Do Not Intubate/DNI  Family Communication: Daughter and son at bedside Disposition Plan: Discharge home likely in 24 hours if remains stable off of BiPAP   Consultants:  None  Procedures:  None  Antimicrobials: Oseltamivir  Ceftriaxone     Subjective: Patient reports feeling better this morning. Breathing better.  Objective: BP (!) 156/93   Pulse (!) 56   Temp 97.7 F (36.5 C) (Oral)   Resp (!) 22   Ht 5' 7 (1.702 m)   Wt 77.6 kg   SpO2 100%   BMI 26.78 kg/m   Examination:  General exam: Appears calm and comfortable. Respiratory system: Respiratory effort normal. Cardiovascular system: S1 & S2 heard, RRR. Gastrointestinal system: Abdomen is nondistended, soft and nontender. Normal bowel sounds heard. Central nervous system: Alert and  oriented. No focal neurological deficits. Psychiatry: Judgement and insight appear normal. Mood & affect appropriate.    Data Reviewed: I have personally reviewed following labs and imaging studies   Last CBC Lab Results  Component Value Date   WBC 7.8 08/11/2024   HGB 11.6 (L) 08/11/2024   HCT 34.0 (L) 08/11/2024   MCV 96.4 08/11/2024   MCH 35.5 (H) 08/11/2024   RDW 13.0 08/11/2024   PLT 68 (L) 08/11/2024     Last metabolic panel Lab Results  Component Value Date   GLUCOSE 150 (H) 08/11/2024   NA 136 08/11/2024   K 3.4 (L) 08/11/2024   CL 99 08/11/2024   CO2 21 (L) 08/11/2024   BUN 24 (H) 08/11/2024   CREATININE 1.10 08/11/2024   GFRNONAA >60 08/11/2024   CALCIUM  9.4 08/11/2024   PHOS 1.9 (L) 08/11/2024   PROT 6.9 08/11/2024   ALBUMIN 4.1 08/11/2024   LABGLOB 2.5 06/28/2024   AGRATIO 1.4 06/28/2024   BILITOT 1.4 (H) 08/11/2024   ALKPHOS 144 (H) 08/11/2024   AST 68 (H) 08/11/2024   ALT 36 08/11/2024   ANIONGAP 16 (H) 08/11/2024     Creatinine Clearance: Estimated Creatinine Clearance: 46.7 mL/min (by C-G formula based on SCr of 1.1 mg/dL).  Recent Results (from the past 240 hours)  Resp panel by RT-PCR (RSV, Flu A&B, Covid) Anterior Nasal Swab     Status: Abnormal   Collection Time: 08/09/24  9:50 AM   Specimen: Anterior Nasal Swab  Result Value Ref Range Status   SARS Coronavirus 2 by RT PCR NEGATIVE NEGATIVE Final    Comment: (NOTE) SARS-CoV-2 target nucleic acids are NOT  DETECTED.  The SARS-CoV-2 RNA is generally detectable in upper respiratory specimens during the acute phase of infection. The lowest concentration of SARS-CoV-2 viral copies this assay can detect is 138 copies/mL. A negative result does not preclude SARS-Cov-2 infection and should not be used as the sole basis for treatment or other patient management decisions. A negative result may occur with  improper specimen collection/handling, submission of specimen other than nasopharyngeal  swab, presence of viral mutation(s) within the areas targeted by this assay, and inadequate number of viral copies(<138 copies/mL). A negative result must be combined with clinical observations, patient history, and epidemiological information. The expected result is Negative.  Fact Sheet for Patients:  bloggercourse.com  Fact Sheet for Healthcare Providers:  seriousbroker.it  This test is no t yet approved or cleared by the [place]  FDA and  has been authorized for detection and/or diagnosis of SARS-CoV-2 by FDA under an Emergency Use Authorization (EUA). This EUA will remain  in effect (meaning this test can be used) for the duration of the COVID-19 declaration under Section 564(b)(1) of the Act, 21 U.S.C.section 360bbb-3(b)(1), unless the authorization is terminated  or revoked sooner.       Influenza A by PCR POSITIVE (A) NEGATIVE Final   Influenza B by PCR NEGATIVE NEGATIVE Final    Comment: (NOTE) The Xpert Xpress SARS-CoV-2/FLU/RSV plus assay is intended as an aid in the diagnosis of influenza from Nasopharyngeal swab specimens and should not be used as a sole basis for treatment. Nasal washings and aspirates are unacceptable for Xpert Xpress SARS-CoV-2/FLU/RSV testing.  Fact Sheet for Patients: bloggercourse.com  Fact Sheet for Healthcare Providers: seriousbroker.it  This test is not yet approved or cleared by the [place]  FDA and has been authorized for detection and/or diagnosis of SARS-CoV-2 by FDA under an Emergency Use Authorization (EUA). This EUA will remain in effect (meaning this test can be used) for the duration of the COVID-19 declaration under Section 564(b)(1) of the Act, 21 U.S.C. section 360bbb-3(b)(1), unless the authorization is terminated or revoked.     Resp Syncytial Virus by PCR NEGATIVE NEGATIVE Final    Comment: (NOTE) Fact Sheet  for Patients: bloggercourse.com  Fact Sheet for Healthcare Providers: seriousbroker.it  This test is not yet approved or cleared by the [place]  FDA and has been authorized for detection and/or diagnosis of SARS-CoV-2 by FDA under an Emergency Use Authorization (EUA). This EUA will remain in effect (meaning this test can be used) for the duration of the COVID-19 declaration under Section 564(b)(1) of the Act, 21 U.S.C. section 360bbb-3(b)(1), unless the authorization is terminated or revoked.  Performed at [facility], [address] [place], [place] [address]   Culture, blood (Routine x 2)     Status: None (Preliminary result)   Collection Time: 08/11/24  6:50 PM   Specimen: BLOOD  Result Value Ref Range Status   Specimen Description BLOOD RIGHT ANTECUBITAL  Final   Special Requests   Final    BOTTLES DRAWN AEROBIC AND ANAEROBIC Blood Culture adequate volume   Culture   Final    NO GROWTH < 12 HOURS Performed at [facility], [address], [place], [place] [address]    Report Status PENDING  Incomplete  Culture, blood (Routine x 2)     Status: None (Preliminary result)   Collection Time: 08/11/24  6:51 PM   Specimen: BLOOD LEFT WRIST  Result Value Ref Range Status   Specimen Description BLOOD LEFT WRIST  Final   Special Requests   Final    BOTTLES DRAWN AEROBIC AND ANAEROBIC Blood Culture adequate volume   Culture   Final    NO GROWTH < 12 HOURS Performed at [facility], [address] [place], [place] [address]    Report Status PENDING  Incomplete      Radiology Studies: CT Angio Chest PE W and/or Wo Contrast Result Date: 08/11/2024 EXAM: CTA CHEST 08/11/2024 09:15:59 PM TECHNIQUE: CTA of the chest was performed without and with the administration of 75 mL of iohexol  (OMNIPAQUE ) 350 MG/ML injection. Multiplanar reformatted images are provided for review. MIP images are provided for  review. Automated exposure control, iterative reconstruction, and/or weight based adjustment of the mA/kV was utilized to reduce the radiation dose to as low as reasonably achievable. COMPARISON: None available. CLINICAL HISTORY: Pulmonary embolism (PE) suspected, high prob. FINDINGS: PULMONARY ARTERIES: Pulmonary arteries are adequately opacified for evaluation. No acute pulmonary embolus. Central pulmonary arteries are of normal caliber. MEDIASTINUM: Extensive multivessel coronary artery calcifications. Global cardiac size within normal limits. No pericardial effusion. Mild atherosclerotic calcification within the thoracic aorta. No aortic aneurysm. LYMPH NODES: No mediastinal, hilar or axillary lymphadenopathy. LUNGS AND PLEURA: 2 - 3 mm nodule within the left apex represents a small focus of peripheral mucoid impaction. 5 mm nodule within the right upper lobe (52 / 6) is stable since prior examination and is considered benign. Peribronchial nodularity within the posterior segment of the right upper lobe is present with associated asymmetric bronchial wall thickening suggesting a small focus of infectious bronchiolitis and associated airway inflammation. No focal consolidation or pulmonary edema. No evidence of pleural effusion or pneumothorax. UPPER ABDOMEN: Large hiatal hernia. Status post cholecystectomy. At least small splenomegaly with a spleen not fully included on this examination. SOFT TISSUES AND BONES: Osseous structures are age appropriate. No acute bone abnormality. No lytic or blastic bone lesion. No acute soft tissue abnormality. IMPRESSION: 1. No pulmonary embolism. 2. Small focus of infectious bronchiolitis and associated airway inflammation in the posterior segment of the right upper lobe. 3. Large hiatal hernia. 4. At least mild splenomegaly with the spleen not fully included on this examination. 5. Extensive multivessel coronary artery calcifications. 6. Mild thoracic aortic atherosclerosis  without aortic aneurysm. 7. Stable 5 mm right upper lobe nodule, benign. 8. RAF score - Aortic atherosclerosis (ICD10-I70.0). Electronically signed by: Dorethia Molt MD 08/11/2024 10:11 PM EST RP Workstation: [id number]   DG Chest Port 1 View if patient is in a treatment room. Result Date: 08/11/2024 EXAM: 1 VIEW(S) XRAY OF THE CHEST 08/11/2024 07:02:00 PM COMPARISON: 08/09/2024 CLINICAL HISTORY: Suspected Sepsis FINDINGS: LUNGS AND PLEURA: Elevated right hemidiaphragm. Calcified granuloma in right lung base, unchanged. There is likely an additional small calcified granuloma over the medial right upper lobe. Slightly low lung volumes. No pleural effusion. No pneumothorax. HEART AND MEDIASTINUM: Retrocardiac opacity consistent with large hiatal hernia. Aortic atherosclerosis. BONES AND SOFT TISSUES: No acute osseous abnormality. IMPRESSION: 1. No acute findings. 2. Large hiatal hernia. Electronically signed by: Donnice Mania MD 08/11/2024 07:51 PM EST RP Workstation: [id number]      LOS: 1 day    Elgin Lam, MD [facility] 08/12/2024, 7:38 AM   If 7PM-7AM, please contact night-coverage [email or web address]  "

## 2024-08-12 NOTE — Progress Notes (Signed)
 RN and RT attempted to wean patient off of BiPAP.  Patient still has increased WOB.  Patient placed back on BiPAP.

## 2024-08-12 NOTE — Progress Notes (Signed)
 PHARMACY NOTE:  ANTIMICROBIAL RENAL DOSAGE ADJUSTMENT  Current antimicrobial regimen includes a mismatch between antimicrobial dosage and estimated renal function.  As per policy approved by the Pharmacy & Therapeutics and Medical Executive Committees, the antimicrobial dosage will be adjusted accordingly.  Current antimicrobial dosage:  Tamiflu  75 mg BID   Indication: Flu A   Renal Function:  Estimated Creatinine Clearance: 46.7 mL/min (by C-G formula based on SCr of 1.1 mg/dL). []      On intermittent HD, scheduled: []      On CRRT    Antimicrobial dosage has been changed to:  75 mg x1 followed by 30 mg BID   Additional comments:   Thank you for allowing pharmacy to be a part of this patient's care.  Rankin Sams, [facility] 08/12/2024 7:18 AM

## 2024-08-13 DIAGNOSIS — C9000 Multiple myeloma not having achieved remission: Secondary | ICD-10-CM | POA: Diagnosis not present

## 2024-08-13 MED ORDER — ALUM & MAG HYDROXIDE-SIMETH 200-200-20 MG/5ML PO SUSP: 30.0000 mL | Freq: Four times a day (QID) | ORAL | Status: DC | PRN

## 2024-08-13 MED ORDER — ALBUTEROL SULFATE HFA 108 (90 BASE) MCG/ACT IN AERS: 2.0000 | INHALATION_SPRAY | Freq: Four times a day (QID) | RESPIRATORY_TRACT | 0 refills | Status: AC | PRN

## 2024-08-13 MED ORDER — GUAIFENESIN ER 600 MG PO TB12: 600.0000 mg | ORAL_TABLET | Freq: Two times a day (BID) | ORAL | 0 refills | Status: AC

## 2024-08-13 MED ORDER — CEFDINIR 300 MG PO CAPS: 300.0000 mg | ORAL_CAPSULE | Freq: Two times a day (BID) | ORAL | 0 refills | Status: AC

## 2024-08-13 MED ORDER — OSELTAMIVIR PHOSPHATE 30 MG PO CAPS: 30.0000 mg | ORAL_CAPSULE | Freq: Two times a day (BID) | ORAL | 0 refills | Status: AC

## 2024-08-13 NOTE — Hospital Course (Signed)
 Riley Eaton is a 84 y.o. male with a history of CAD, CKD stage IIIb, IgG myeloma, MGUS, heart failure, GERD, hyperlipidemia.  Patient presented secondary to shortness of breath and tachypnea with known influenza infection. Patient managed supportively with continued Tamiflu  and addition of BiPAP initially. Patient remained stable without need for further BiPAP prior to discharge home.

## 2024-08-13 NOTE — Discharge Instructions (Signed)
 Riley Eaton,  You were in the hospital with trouble breathing from your influenza infection. You have some evidence of bronchitis and have been given antibiotics and breathing treatments as needed. Please continue your Tamiflu , although I have sent a new prescription because you require a different dose (kidney function/age related). I am also discharging you with an antibiotic and albuterol . Please follow-up with your PCP.

## 2024-08-13 NOTE — Evaluation (Signed)
 Occupational Therapy Evaluation Patient Details Name: Riley Eaton MRN: [id number] DOB: [birth year] Today's Date: 08/13/2024   History of Present Illness   Riley Eaton is a 84 y.o. male who presents on 08/11/24 with nausea and increased WOB, has flu A. PMH: MGUS; CAD; CKD 3B, IDA, neuropathy, HLD, thoracic aortic aneurysm; CHF; BPH s/p TURP; and chronic pain and anxiety     Clinical Impressions Pt reported at PLOF they live alone and is indep to mod I due to chronic back pain. Pt was able to complete all dressing with mod I to supervision for line management. He then agreed to ambulate in room with distant supervision.  At this time educated on pacing self and modifications due to back pain. Acute Occupational Therapy signing off and no follow up recommendations.      If plan is discharge home, recommend the following:   Assist for transportation;Assistance with cooking/housework     Functional Status Assessment   Patient has had a recent decline in their functional status and demonstrates the ability to make significant improvements in function in a reasonable and predictable amount of time.     Equipment Recommendations   None recommended by OT     Recommendations for Other Services         Precautions/Restrictions   Precautions Precautions: Other (comment) Recall of Precautions/Restrictions: Intact Precaution/Restrictions Comments: watch O2 sats Restrictions Weight Bearing Restrictions Per Provider Order: No     Mobility Bed Mobility               General bed mobility comments: pt presented sitting EOB    Transfers Overall transfer level: Modified independent Equipment used: Rolling walker (2 wheels)               General transfer comment: stood multiple times without assist      Balance Overall balance assessment: Mild deficits observed, not formally tested                                         ADL either  performed or assessed with clinical judgement   ADL Overall ADL's : Needs assistance/impaired Eating/Feeding: Independent;Sitting   Grooming: Modified independent;Sitting;Standing   Upper Body Bathing: Modified independent;Sitting   Lower Body Bathing: Supervison/ safety;Sit to/from stand   Upper Body Dressing : Modified independent;Sitting   Lower Body Dressing: Supervision/safety;Sit to/from stand   Toilet Transfer: Supervision/safety   Toileting- Architect and Hygiene: Supervision/safety;Sit to/from stand       Functional mobility during ADLs: Supervision/safety;Cueing for safety;Cueing for sequencing       Vision Baseline Vision/History: 1 Wears glasses Ability to See in Adequate Light: 0 Adequate Patient Visual Report: No change from baseline       Perception         Praxis         Pertinent Vitals/Pain Pain Assessment Pain Assessment: 0-10 Pain Score: 8  Pain Location: back Pain Descriptors / Indicators: Constant Pain Intervention(s): Limited activity within patient's tolerance, Monitored during session, Repositioned (Pt reported this is typical pain level due to chronic back issues)     Extremity/Trunk Assessment Upper Extremity Assessment Upper Extremity Assessment: Overall WFL for tasks assessed   Lower Extremity Assessment Lower Extremity Assessment: Defer to PT evaluation   Cervical / Trunk Assessment Cervical / Trunk Assessment: Kyphotic;Back Surgery   Communication Communication Communication: Impaired Factors Affecting Communication: Hearing impaired  Cognition Arousal: Alert Behavior During Therapy: WFL for tasks assessed/performed Cognition: No apparent impairments                               Following commands: Intact       Cueing  General Comments   Cueing Techniques: Verbal cues      Exercises     Shoulder Instructions      Home Living Family/patient expects to be discharged to:: Private  residence Living Arrangements: Alone Available Help at Discharge: Family;Available PRN/intermittently Type of Home: House Home Access: Stairs to enter [employer] of Steps: 5 Entrance Stairs-Rails: Right Home Layout: One level     Bathroom Shower/Tub: Chief Strategy Officer: Handicapped height     Home Equipment: Rollator (4 wheels);Shower seat;Cane - single point   Additional Comments: son lives less than 5 mins away, lots of other family support too. Family brings food over.      Prior Functioning/Environment Prior Level of Function : Independent/Modified Independent;Driving             Mobility Comments: mobility limited by chronic back pain but pt still drives and mobilizes as tolerated ADLs Comments: independent    OT Problem List: Decreased strength;Decreased activity tolerance;Impaired balance (sitting and/or standing);Decreased safety awareness;Decreased knowledge of use of DME or AE;Pain   OT Treatment/Interventions: Self-care/ADL training;Therapeutic exercise;Therapeutic activities;Balance training;Patient/family education      OT Goals(Current goals can be found in the care plan section)   Acute Rehab OT Goals Patient Stated Goal: to go home OT Goal Formulation: With patient Time For Goal Achievement: 08/27/24 Potential to Achieve Goals: Good   OT Frequency:  Min 2X/week    Co-evaluation              AM-PAC OT 6 Clicks Daily Activity     Outcome Measure Help from another person eating meals?: None Help from another person taking care of personal grooming?: None Help from another person toileting, which includes using toliet, bedpan, or urinal?: None Help from another person bathing (including washing, rinsing, drying)?: A Little Help from another person to put on and taking off regular upper body clothing?: None Help from another person to put on and taking off regular lower body clothing?: None 6 Click Score: 23    End of Session Equipment Utilized During Treatment: Gait belt;Rolling walker (2 wheels) Nurse Communication: Mobility status  Activity Tolerance: Patient tolerated treatment well Patient left: in bed;with call bell/phone within reach;with family/visitor present (son)  OT Visit Diagnosis: Other abnormalities of gait and mobility (R26.89);Muscle weakness (generalized) (M62.81);Pain Pain - Right/Left:  (back)                Time: 9086-9067 OT Time Calculation (min): 19 min Charges:  OT General Charges $OT Visit: 1 Visit OT Evaluation $OT Eval Low Complexity: 1 Low  Warrick POUR OTR/L  Acute Rehab Services  [phone] office number   Warrick Berber 08/13/2024, 9:53 AM

## 2024-08-13 NOTE — Progress Notes (Signed)
 Discharge Nurse Summary: DC order noted per MD. DC RN at bedside with patient. Patient agreeable with discharge plan, agreeable to transport to dc lounge to await family pickup. AVS printed/reviewed. PIV removed. Telemonitor returned to charging station. No DME/TOC meds. CP/Edu resolved. Patient dressed and ready to go. All belongings accounted for. Transport team wheeled patient downstairs to [employer].  Rosario Lund, RN

## 2024-08-13 NOTE — Discharge Summary (Signed)
 " Physician Discharge Summary   Patient: Riley Eaton MRN: [id number] DOB: 12/25/1940  Admit date:     08/11/2024  Discharge date: 08/13/2024  Discharge Physician: Elgin Lam, MD   PCP: Debrah Josette ORN., PA-C   Recommendations at discharge:  PCP visit for hospital follow-up  Discharge Diagnoses: Principal Problem:   Influenza A Active Problems:   Thoracic aortic aneurysm   Hypokalemia   Hyperlipidemia   Chronic systolic CHF (congestive heart failure) (HCC)   Stage 3a chronic kidney disease (HCC)   IgG myeloma (HCC)   Prolonged QT interval   Pyuria   Tachypnea  Resolved Problems:   * No resolved hospital problems. *  Hospital Course: Riley Eaton is a 84 y.o. male with a history of CAD, CKD stage IIIb, IgG myeloma, MGUS, heart failure, GERD, hyperlipidemia.  Patient presented secondary to shortness of breath and tachypnea with known influenza infection. Patient managed supportively with continued Tamiflu  and addition of BiPAP initially. Patient remained stable without need for further BiPAP prior to discharge home.  Assessment and Plan:  Influenza A infection Diagnosed on 08/09/24 and started on Tamiflu . Afebrile. Tamiflu  continued on discharge.   Tachypnea Noted. Likely related to acute illness. Patient was placed on BiPAP for supportive care. CTA chest negative for acute PE. BiPAP discontinued and not needed again during hospitalization.   Pyuria No symptoms at this time.   History of thoracic aortic aneurysm Noted. Not seen on CTA chest this admission.   History of IgG myeloma Noted.   Hypokalemia Mild. Patient given potassium supplementation.   Hyperlipidemia Continue Lipitor.   Chronic HFrecEF Last LVEF of 60-65% from July 2025. Stable.   Consultants: None Procedures performed: None  Disposition: Home Diet recommendation: Regular diet   DISCHARGE MEDICATION: Allergies as of 08/13/2024   No Known Allergies      Medication List      TAKE these medications    acetaminophen  325 MG tablet Commonly known as: TYLENOL  Take 2 tablets (650 mg total) by mouth every 6 (six) hours as needed for mild pain (or temp > 100).   albuterol  108 (90 Base) MCG/ACT inhaler Commonly known as: VENTOLIN  HFA Inhale 2 puffs into the lungs every 6 (six) hours as needed for wheezing or shortness of breath.   allopurinol  100 MG tablet Commonly known as: ZYLOPRIM  TAKE 1 TABLET BY MOUTH EVERY DAY   atorvastatin  40 MG tablet Commonly known as: LIPITOR Take 1 tablet (40 mg total) by mouth daily at 6 PM.   carvedilol  6.25 MG tablet Commonly known as: COREG  Take 1 tablet (6.25 mg total) by mouth 2 (two) times daily.   cefdinir 300 MG capsule Commonly known as: OMNICEF Take 1 capsule (300 mg total) by mouth 2 (two) times daily for 3 days.   cyanocobalamin 500 MCG tablet Commonly known as: VITAMIN B12 Take 500 mcg by mouth daily.   diphenhydramine -acetaminophen  25-500 MG Tabs tablet Commonly known as: TYLENOL  PM Take 1 tablet by mouth at bedtime.   finasteride  5 MG tablet Commonly known as: PROSCAR  Take 1 tablet (5 mg total) by mouth daily. For urination   furosemide  40 MG tablet Commonly known as: LASIX  TAKE 1 TABLET BY MOUTH EVERY DAY   guaiFENesin 600 MG 12 hr tablet Commonly known as: MUCINEX Take 1 tablet (600 mg total) by mouth 2 (two) times daily for 5 days.   HYDROcodone -acetaminophen  7.5-325 MG tablet Commonly known as: NORCO Take 1 tablet by mouth 4 (four) times daily as needed for moderate  pain.   LORazepam  0.5 MG tablet Commonly known as: ATIVAN  TAKE 1 TABLET (0.5 MG TOTAL) BY MOUTH EVERY 6 (SIX) HOURS AS NEEDED (NAUSEA OR VOMITING).   oseltamivir  30 MG capsule Commonly known as: TAMIFLU  Take 1 capsule (30 mg total) by mouth 2 (two) times daily for 4 days. What changed:  medication strength how much to take when to take this   pantoprazole  40 MG tablet Commonly known as: PROTONIX  Take 1 tablet (40 mg  total) by mouth at bedtime.   pregabalin  150 MG capsule Commonly known as: LYRICA  Take 150 mg by mouth in the morning.   traZODone  50 MG tablet Commonly known as: DESYREL  Take 50 mg by mouth at bedtime as needed for sleep.        Follow-up Information     Debrah Josette ORN., PA-C. Schedule an appointment as soon as possible for a visit in 1 week(s).   Specialty: Family Medicine Why: For hospital follow-up Contact information: [address] [place] [address] [phone]                Discharge Exam: BP 126/71 (BP Location: Left Arm)   Pulse 70   Temp 97.7 F (36.5 C) (Oral)   Resp 20   Ht 5' 7 (1.702 m)   Wt 77.6 kg   SpO2 96%   BMI 26.78 kg/m   General exam: Appears calm and comfortable. Respiratory system: Clear to auscultation. Respiratory effort normal. Cardiovascular system: S1 & S2 heard, RRR. Gastrointestinal system: Abdomen is nondistended, soft and nontender. Normal bowel sounds heard. Central nervous system: Alert and oriented. No focal neurological deficits. Musculoskeletal: No edema. No calf tenderness Psychiatry: Judgement and insight appear normal. Mood & affect appropriate.   Condition at discharge: stable  The results of significant diagnostics from this hospitalization (including imaging, microbiology, ancillary and laboratory) are listed below for reference.   Imaging Studies: CT Angio Chest PE W and/or Wo Contrast Result Date: 08/11/2024 EXAM: CTA CHEST 08/11/2024 09:15:59 PM TECHNIQUE: CTA of the chest was performed without and with the administration of 75 mL of iohexol  (OMNIPAQUE ) 350 MG/ML injection. Multiplanar reformatted images are provided for review. MIP images are provided for review. Automated exposure control, iterative reconstruction, and/or weight based adjustment of the mA/kV was utilized to reduce the radiation dose to as low as reasonably achievable. COMPARISON: None available. CLINICAL HISTORY: Pulmonary  embolism (PE) suspected, high prob. FINDINGS: PULMONARY ARTERIES: Pulmonary arteries are adequately opacified for evaluation. No acute pulmonary embolus. Central pulmonary arteries are of normal caliber. MEDIASTINUM: Extensive multivessel coronary artery calcifications. Global cardiac size within normal limits. No pericardial effusion. Mild atherosclerotic calcification within the thoracic aorta. No aortic aneurysm. LYMPH NODES: No mediastinal, hilar or axillary lymphadenopathy. LUNGS AND PLEURA: 2 - 3 mm nodule within the left apex represents a small focus of peripheral mucoid impaction. 5 mm nodule within the right upper lobe (52 / 6) is stable since prior examination and is considered benign. Peribronchial nodularity within the posterior segment of the right upper lobe is present with associated asymmetric bronchial wall thickening suggesting a small focus of infectious bronchiolitis and associated airway inflammation. No focal consolidation or pulmonary edema. No evidence of pleural effusion or pneumothorax. UPPER ABDOMEN: Large hiatal hernia. Status post cholecystectomy. At least small splenomegaly with a spleen not fully included on this examination. SOFT TISSUES AND BONES: Osseous structures are age appropriate. No acute bone abnormality. No lytic or blastic bone lesion. No acute soft tissue abnormality. IMPRESSION: 1. No pulmonary  embolism. 2. Small focus of infectious bronchiolitis and associated airway inflammation in the posterior segment of the right upper lobe. 3. Large hiatal hernia. 4. At least mild splenomegaly with the spleen not fully included on this examination. 5. Extensive multivessel coronary artery calcifications. 6. Mild thoracic aortic atherosclerosis without aortic aneurysm. 7. Stable 5 mm right upper lobe nodule, benign. 8. RAF score - Aortic atherosclerosis (ICD10-I70.0). Electronically signed by: Dorethia Molt MD 08/11/2024 10:11 PM EST RP Workstation: [id number]   DG Chest Port 1  View if patient is in a treatment room. Result Date: 08/11/2024 EXAM: 1 VIEW(S) XRAY OF THE CHEST 08/11/2024 07:02:00 PM COMPARISON: 08/09/2024 CLINICAL HISTORY: Suspected Sepsis FINDINGS: LUNGS AND PLEURA: Elevated right hemidiaphragm. Calcified granuloma in right lung base, unchanged. There is likely an additional small calcified granuloma over the medial right upper lobe. Slightly low lung volumes. No pleural effusion. No pneumothorax. HEART AND MEDIASTINUM: Retrocardiac opacity consistent with large hiatal hernia. Aortic atherosclerosis. BONES AND SOFT TISSUES: No acute osseous abnormality. IMPRESSION: 1. No acute findings. 2. Large hiatal hernia. Electronically signed by: Donnice Mania MD 08/11/2024 07:51 PM EST RP Workstation: [id number]   DG Chest 2 View Result Date: 08/09/2024 EXAM: 2 VIEW(S) XRAY OF THE CHEST 08/09/2024 10:06:00 AM COMPARISON: Chest x-ray 07/17/2019, CT chest 06/12/2022. CLINICAL HISTORY: chest pain FINDINGS: LUNGS AND PLEURA: Elevated right hemidiaphragm. Large hiatal hernia with gas noted within its lumen. No focal pulmonary opacity. No pleural effusion. No pneumothorax. HEART AND MEDIASTINUM: Aortic arch calcifications. BONES AND SOFT TISSUES: No acute osseous abnormality. A 1 cm nodule-like calcified lesion overlies the anterolateral sixth right rib consistent with known bone island. IMPRESSION: 1. No acute cardiopulmonary abnormality. 2. Large hiatal hernia. Electronically signed by: Morgane Naveau MD 08/09/2024 12:22 PM EST RP Workstation: [id number]    Microbiology: Results for orders placed or performed during the hospital encounter of 08/11/24  Culture, blood (Routine x 2)     Status: None (Preliminary result)   Collection Time: 08/11/24  6:50 PM   Specimen: BLOOD  Result Value Ref Range Status   Specimen Description BLOOD RIGHT ANTECUBITAL  Final   Special Requests   Final    BOTTLES DRAWN AEROBIC AND ANAEROBIC Blood Culture adequate volume   Culture   Final     NO GROWTH 2 DAYS Performed at [facility], [address] [place], [place] [address]    Report Status PENDING  Incomplete  Culture, blood (Routine x 2)     Status: None (Preliminary result)   Collection Time: 08/11/24  6:51 PM   Specimen: BLOOD LEFT WRIST  Result Value Ref Range Status   Specimen Description BLOOD LEFT WRIST  Final   Special Requests   Final    BOTTLES DRAWN AEROBIC AND ANAEROBIC Blood Culture adequate volume   Culture   Final    NO GROWTH 2 DAYS Performed at [facility], [address] [place], [place] [address]    Report Status PENDING  Incomplete  MRSA Next Gen by PCR, Nasal     Status: None   Collection Time: 08/12/24  7:01 PM   Specimen: Nasal Mucosa; Nasal Swab  Result Value Ref Range Status   MRSA by PCR Next Gen NOT DETECTED NOT DETECTED Final    Comment: (NOTE) The GeneXpert MRSA Assay (FDA approved for NASAL specimens only), is one component of a comprehensive MRSA colonization surveillance program. It is not intended to diagnose MRSA infection nor to guide or monitor treatment for MRSA infections. Test performance  is not FDA approved in patients less than 54 years old. Performed at [facility], [address] [place], [place] [address]     Labs: CBC: Recent Labs  Lab 08/09/24 0949 08/11/24 1850 08/11/24 1904  WBC 4.9 7.8  --   NEUTROABS  --  5.5  --   HGB 11.3* 11.8* 11.6*  HCT 31.7* 32.0* 34.0*  MCV 99.1 96.4  --   PLT 59* 68*  --    Basic Metabolic Panel: Recent Labs  Lab 08/09/24 0949 08/11/24 1850 08/11/24 1904 08/12/24 1528  NA 138 136 136 138  K 3.7 3.4* 3.4* 3.4*  CL 102 99  --  106  CO2 25 21*  --  20*  GLUCOSE 100* 150*  --  108*  BUN 24* 24*  --  22  CREATININE 1.11 1.10  --  0.93  CALCIUM  9.2 9.4  --  8.5*  MG  --  2.0  --   --   PHOS  --  1.9*  --   --    Liver Function Tests: Recent Labs  Lab 08/11/24 1850  AST 68*  ALT 36  ALKPHOS 144*  BILITOT 1.4*  PROT 6.9  ALBUMIN 4.1     Discharge time spent: 35 minutes.  Signed: Elgin Lam, MD [facility] 08/13/2024 "

## 2024-08-14 ENCOUNTER — Other Ambulatory Visit: Payer: Self-pay | Admitting: Hematology & Oncology

## 2024-08-14 DIAGNOSIS — D472 Monoclonal gammopathy: Secondary | ICD-10-CM

## 2024-08-14 LAB — URINE CULTURE: Culture: >=100000 — AB

## 2024-08-16 LAB — CULTURE, BLOOD (ROUTINE X 2)
Culture: NO GROWTH
Culture: NO GROWTH
Special Requests: ADEQUATE
Special Requests: ADEQUATE

## 2024-08-17 ENCOUNTER — Other Ambulatory Visit: Payer: Self-pay | Admitting: Cardiovascular Disease

## 2024-08-30 ENCOUNTER — Other Ambulatory Visit: Payer: Self-pay

## 2024-08-30 ENCOUNTER — Encounter: Payer: Self-pay | Admitting: Hematology & Oncology

## 2024-08-30 ENCOUNTER — Inpatient Hospital Stay: Attending: Hematology & Oncology

## 2024-08-30 ENCOUNTER — Inpatient Hospital Stay: Admitting: Hematology & Oncology

## 2024-08-30 VITALS — BP 136/66 | HR 51 | Temp 97.6°F | Resp 18 | Ht 67.0 in | Wt 167.0 lb

## 2024-08-30 DIAGNOSIS — C9000 Multiple myeloma not having achieved remission: Secondary | ICD-10-CM | POA: Diagnosis not present

## 2024-08-30 LAB — CBC WITH DIFFERENTIAL (CANCER CENTER ONLY)
Abs Immature Granulocytes: 0.05 K/uL (ref 0.00–0.07)
Basophils Absolute: 0 K/uL (ref 0.0–0.1)
Basophils Relative: 0 %
Eosinophils Absolute: 0.1 K/uL (ref 0.0–0.5)
Eosinophils Relative: 2 %
HCT: 31.5 % — ABNORMAL LOW (ref 39.0–52.0)
Hemoglobin: 10.8 g/dL — ABNORMAL LOW (ref 13.0–17.0)
Immature Granulocytes: 1 %
Lymphocytes Relative: 21 %
Lymphs Abs: 1.1 K/uL (ref 0.7–4.0)
MCH: 34.8 pg — ABNORMAL HIGH (ref 26.0–34.0)
MCHC: 34.3 g/dL (ref 30.0–36.0)
MCV: 101.6 fL — ABNORMAL HIGH (ref 80.0–100.0)
Monocytes Absolute: 0.4 K/uL (ref 0.1–1.0)
Monocytes Relative: 7 %
Neutro Abs: 3.8 K/uL (ref 1.7–7.7)
Neutrophils Relative %: 69 %
Platelet Count: 86 K/uL — ABNORMAL LOW (ref 150–400)
RBC: 3.1 MIL/uL — ABNORMAL LOW (ref 4.22–5.81)
RDW: 14.1 % (ref 11.5–15.5)
WBC Count: 5.4 K/uL (ref 4.0–10.5)
nRBC: 0 % (ref 0.0–0.2)

## 2024-08-30 LAB — CMP (CANCER CENTER ONLY)
ALT: 12 U/L (ref 0–44)
AST: 29 U/L (ref 15–41)
Albumin: 4.2 g/dL (ref 3.5–5.0)
Alkaline Phosphatase: 101 U/L (ref 38–126)
Anion gap: 8 (ref 5–15)
BUN: 22 mg/dL (ref 8–23)
CO2: 29 mmol/L (ref 22–32)
Calcium: 9.2 mg/dL (ref 8.9–10.3)
Chloride: 106 mmol/L (ref 98–111)
Creatinine: 0.96 mg/dL (ref 0.61–1.24)
GFR, Estimated: >60 mL/min
Glucose, Bld: 124 mg/dL — ABNORMAL HIGH (ref 70–99)
Potassium: 4 mmol/L (ref 3.5–5.1)
Sodium: 143 mmol/L (ref 135–145)
Total Bilirubin: 0.7 mg/dL (ref 0.0–1.2)
Total Protein: 6.8 g/dL (ref 6.5–8.1)

## 2024-08-30 LAB — LACTATE DEHYDROGENASE: LDH: 387 U/L — ABNORMAL HIGH (ref 105–235)

## 2024-08-30 NOTE — Progress Notes (Signed)
 " Hematology and Oncology Follow Up Visit  Riley Eaton [id number] 1940/10/19 84 y.o. 08/30/2024   Principle Diagnosis:  IgG Kappa myeloma  -- +1q and t(11:14)   Current Therapy:        Velcade /Decadron  -- (3 wk on/1 wk off) -- s/p cycle #12 -- started on 08/24/2021-DC on 03/06/2023  Riley Eaton - s/p cycle 6 -  started on 04/30/2022-DC on 03/06/2023     Interim History:  Riley Eaton is here today for follow-up.  We last saw him back in October.  Since then, has been doing well.  He had no problems over the Holiday season.  He has been eating well.  He has had no nausea or vomiting.  He had no cough or shortness of breath..  Still has a lot of back issues.  This has been chronic.  He has had no fever.  Thankfully, there is been no problems with COVID or Influenza.  When we last saw him, his monoclonal spike was 0.5 g/dL.  His IgG level was 1001 140 mg/dL.  His kappa light chain was up a little bit at 8.3 mg/dL.  We are going to have to watch this.  Overall, I would have to say that his performance status is probably ECOG 2.  Wt Readings from Last 3 Encounters:  08/30/24 167 lb (75.8 kg)  08/11/24 171 lb (77.6 kg)  06/28/24 171 lb 1.9 oz (77.6 kg)   Medications:  Allergies as of 08/30/2024   No Known Allergies      Medication List        Accurate as of August 30, 2024 12:07 PM. If you have any questions, ask your nurse or doctor.          acetaminophen  325 MG tablet Commonly known as: TYLENOL  Take 2 tablets (650 mg total) by mouth every 6 (six) hours as needed for mild pain (or temp > 100).   albuterol  108 (90 Base) MCG/ACT inhaler Commonly known as: VENTOLIN  HFA Inhale 2 puffs into the lungs every 6 (six) hours as needed for wheezing or shortness of breath.   allopurinol  100 MG tablet Commonly known as: ZYLOPRIM  TAKE 1 TABLET BY MOUTH EVERY DAY   atorvastatin  40 MG tablet Commonly known as: LIPITOR Take 1 tablet (40 mg total) by mouth daily at 6 PM.   carvedilol   6.25 MG tablet Commonly known as: COREG  Take 1 tablet (6.25 mg total) by mouth 2 (two) times daily.   cyanocobalamin 500 MCG tablet Commonly known as: VITAMIN B12 Take 500 mcg by mouth daily.   diphenhydramine -acetaminophen  25-500 MG Tabs tablet Commonly known as: TYLENOL  PM Take 1 tablet by mouth at bedtime.   finasteride  5 MG tablet Commonly known as: PROSCAR  Take 1 tablet (5 mg total) by mouth daily. For urination   furosemide  40 MG tablet Commonly known as: LASIX  TAKE 1 TABLET BY MOUTH EVERY DAY   HYDROcodone -acetaminophen  7.5-325 MG tablet Commonly known as: NORCO Take 1 tablet by mouth 4 (four) times daily as needed for moderate pain.   LORazepam  0.5 MG tablet Commonly known as: ATIVAN  TAKE 1 TABLET (0.5 MG TOTAL) BY MOUTH EVERY 6 (SIX) HOURS AS NEEDED (NAUSEA OR VOMITING).   pantoprazole  40 MG tablet Commonly known as: PROTONIX  Take 1 tablet (40 mg total) by mouth at bedtime.   pregabalin  150 MG capsule Commonly known as: LYRICA  Take 150 mg by mouth in the morning.   traZODone  50 MG tablet Commonly known as: DESYREL  Take 50 mg by mouth at  bedtime as needed for sleep.        Allergies: No Known Allergies  Past Medical History, Surgical history, Social history, and Family History were reviewed and updated.  Review of Systems: Review of Systems  Constitutional: Negative.   HENT: Negative.    Eyes: Negative.   Respiratory: Negative.    Cardiovascular: Negative.   Gastrointestinal: Negative.   Genitourinary: Negative.   Musculoskeletal:  Positive for back pain.  Skin: Negative.   Neurological: Negative.   Endo/Heme/Allergies: Negative.   Psychiatric/Behavioral: Negative.     Physical Exam:  height is 5' 7 (1.702 m) and weight is 167 lb (75.8 kg). His oral temperature is 97.6 F (36.4 C). His blood pressure is 136/66 and his pulse is 51 (abnormal). His respiration is 18 and oxygen  saturation is 99%.   Wt Readings from Last 3 Encounters:  08/30/24  167 lb (75.8 kg)  08/11/24 171 lb (77.6 kg)  06/28/24 171 lb 1.9 oz (77.6 kg)   Physical Exam Vitals reviewed.  HENT:     Head: Normocephalic and atraumatic.  Eyes:     Pupils: Pupils are equal, round, and reactive to light.  Cardiovascular:     Rate and Rhythm: Normal rate and regular rhythm.     Heart sounds: Normal heart sounds.  Pulmonary:     Effort: Pulmonary effort is normal.     Breath sounds: Normal breath sounds.  Abdominal:     General: Bowel sounds are normal.     Palpations: Abdomen is soft.  Musculoskeletal:        General: No tenderness or deformity. Normal range of motion.     Cervical back: Normal range of motion.  Lymphadenopathy:     Cervical: No cervical adenopathy.  Skin:    General: Skin is warm and dry.     Findings: No erythema or rash.  Neurological:     Mental Status: He is alert and oriented to person, place, and time.  Psychiatric:        Behavior: Behavior normal.        Thought Content: Thought content normal.        Judgment: Judgment normal.      Lab Results  Component Value Date   WBC 5.4 08/30/2024   HGB 10.8 (L) 08/30/2024   HCT 31.5 (L) 08/30/2024   MCV 101.6 (H) 08/30/2024   PLT 86 (L) 08/30/2024   No results found for: FERRITIN, IRON, TIBC, UIBC, IRONPCTSAT Lab Results  Component Value Date   RBC 3.10 (L) 08/30/2024   Lab Results  Component Value Date   KPAFRELGTCHN 83.4 (H) 06/28/2024   LAMBDASER 22.1 06/28/2024   KAPLAMBRATIO 3.77 (H) 06/28/2024   Lab Results  Component Value Date   IGGSERUM 1,089 06/28/2024   IGGSERUM 1,196 06/28/2024   IGA 71 06/28/2024   IGA 77 06/28/2024   IGMSERUM 40 06/28/2024   IGMSERUM 48 06/28/2024   Lab Results  Component Value Date   TOTALPROTELP 6.1 06/28/2024   ALBUMINELP 3.6 06/28/2024   A1GS 0.3 06/28/2024   A2GS 0.5 06/28/2024   BETS 0.7 06/28/2024   GAMS 1.1 06/28/2024   MSPIKE 0.5 (H) 06/28/2024   SPEI Comment 05/29/2023     Chemistry      Component Value  Date/Time   NA 143 08/30/2024 0954   NA 142 10/15/2019 1500   K 4.0 08/30/2024 0954   CL 106 08/30/2024 0954   CO2 29 08/30/2024 0954   BUN 22 08/30/2024 0954   BUN 25 10/15/2019  1500   CREATININE 0.96 08/30/2024 0954      Component Value Date/Time   CALCIUM  9.2 08/30/2024 0954   ALKPHOS 101 08/30/2024 0954   AST 29 08/30/2024 0954   ALT 12 08/30/2024 0954   BILITOT 0.7 08/30/2024 0954     Impression and Plan: Mr. Cappella is a very pleasant 84 yo caucasian gentleman with IgG Kappa myeloma.  We are watching him.  He still has the low white cells and platelets.  However, he is totally asymptomatic with this.  At this point, we will now plan to get him back in a couple of months.  He had a [birth year] yesterday.  This was a big birthday for him.  He had a good time.  As expected, there was a good celebration.     Maude JONELLE Crease, MD 1/19/202612:07 PM  "

## 2024-08-31 LAB — KAPPA/LAMBDA LIGHT CHAINS
Kappa free light chain: 94.8 mg/L — ABNORMAL HIGH (ref 3.3–19.4)
Kappa, lambda light chain ratio: 3.9 — ABNORMAL HIGH (ref 0.26–1.65)
Lambda free light chains: 24.3 mg/L (ref 5.7–26.3)

## 2024-08-31 LAB — IGG, IGA, IGM
IgA: 99 mg/dL (ref 61–437)
IgG (Immunoglobin G), Serum: 1252 mg/dL (ref 603–1613)
IgM (Immunoglobulin M), Srm: 44 mg/dL (ref 15–143)

## 2024-09-02 LAB — PROTEIN ELECTROPHORESIS, SERUM, WITH REFLEX
A/G Ratio: 1.3 (ref 0.7–1.7)
Albumin ELP: 3.6 g/dL (ref 2.9–4.4)
Alpha-1-Globulin: 0.3 g/dL (ref 0.0–0.4)
Alpha-2-Globulin: 0.4 g/dL (ref 0.4–1.0)
Beta Globulin: 0.8 g/dL (ref 0.7–1.3)
Gamma Globulin: 1.3 g/dL (ref 0.4–1.8)
Globulin, Total: 2.7 g/dL (ref 2.2–3.9)
M-Spike, %: 0.8 g/dL — ABNORMAL HIGH
SPEP Interpretation: 0
Total Protein ELP: 6.3 g/dL (ref 6.0–8.5)

## 2024-09-02 LAB — IMMUNOFIXATION REFLEX, SERUM
IgA: 105 mg/dL (ref 61–437)
IgG (Immunoglobin G), Serum: 1377 mg/dL (ref 603–1613)
IgM (Immunoglobulin M), Srm: 45 mg/dL (ref 15–143)

## 2024-09-14 ENCOUNTER — Other Ambulatory Visit: Payer: Self-pay | Admitting: Cardiovascular Disease

## 2024-10-27 ENCOUNTER — Inpatient Hospital Stay: Admitting: Hematology & Oncology

## 2024-10-27 ENCOUNTER — Inpatient Hospital Stay: Attending: Hematology & Oncology
